# Patient Record
Sex: Male | Born: 1960 | Race: Black or African American | Hispanic: No | Marital: Married | Smoking: Former smoker
Health system: Southern US, Community
[De-identification: ages and names within clinical notes are randomized; demographics above are authoritative.]

## PROBLEM LIST (undated history)

## (undated) DIAGNOSIS — G4730 Sleep apnea, unspecified: Secondary | ICD-10-CM

## (undated) DIAGNOSIS — R609 Edema, unspecified: Secondary | ICD-10-CM

## (undated) DIAGNOSIS — J4489 Other specified chronic obstructive pulmonary disease: Secondary | ICD-10-CM

## (undated) DIAGNOSIS — F418 Other specified anxiety disorders: Secondary | ICD-10-CM

## (undated) DIAGNOSIS — D649 Anemia, unspecified: Secondary | ICD-10-CM

## (undated) DIAGNOSIS — M1990 Unspecified osteoarthritis, unspecified site: Secondary | ICD-10-CM

## (undated) DIAGNOSIS — J449 Chronic obstructive pulmonary disease, unspecified: Secondary | ICD-10-CM

## (undated) DIAGNOSIS — K219 Gastro-esophageal reflux disease without esophagitis: Secondary | ICD-10-CM

## (undated) DIAGNOSIS — N281 Cyst of kidney, acquired: Secondary | ICD-10-CM

## (undated) DIAGNOSIS — I10 Essential (primary) hypertension: Secondary | ICD-10-CM

## (undated) DIAGNOSIS — J4520 Mild intermittent asthma, uncomplicated: Secondary | ICD-10-CM

## (undated) DIAGNOSIS — E7800 Pure hypercholesterolemia, unspecified: Secondary | ICD-10-CM

## (undated) DIAGNOSIS — F419 Anxiety disorder, unspecified: Secondary | ICD-10-CM

## (undated) DIAGNOSIS — E119 Type 2 diabetes mellitus without complications: Secondary | ICD-10-CM

## (undated) DIAGNOSIS — T7840XA Allergy, unspecified, initial encounter: Secondary | ICD-10-CM

## (undated) HISTORY — DX: Essential (primary) hypertension: I10

## (undated) HISTORY — DX: Edema, unspecified: R60.9

## (undated) HISTORY — DX: Mild intermittent asthma, uncomplicated: J45.20

## (undated) HISTORY — PX: KNEE ARTHROSCOPY: SUR90

## (undated) HISTORY — DX: Type 2 diabetes mellitus without complications: E11.9

## (undated) HISTORY — DX: Chronic obstructive pulmonary disease, unspecified: J44.9

## (undated) HISTORY — PX: VEIN SURGERY: SHX48

## (undated) HISTORY — DX: Other specified chronic obstructive pulmonary disease: J44.89

## (undated) HISTORY — DX: Allergy, unspecified, initial encounter: T78.40XA

## (undated) HISTORY — DX: Cyst of kidney, acquired: N28.1

## (undated) HISTORY — DX: Anxiety disorder, unspecified: F41.9

## (undated) HISTORY — DX: Other specified anxiety disorders: F41.8

## (undated) HISTORY — DX: Pure hypercholesterolemia, unspecified: E78.00

## (undated) HISTORY — PX: BLADDER REPAIR: SHX76

## (undated) HISTORY — PX: HAND SURGERY: SHX662

---

## 2005-04-19 LAB — HM COLONOSCOPY: HM Colonoscopy: NORMAL

## 2005-06-14 ENCOUNTER — Ambulatory Visit: Payer: Self-pay | Admitting: General Surgery

## 2005-12-07 ENCOUNTER — Ambulatory Visit: Payer: Self-pay | Admitting: Family Medicine

## 2005-12-07 IMAGING — CR DG RIBS 2V*R*
1 series · 4 of 4 positions shown · non-contrast
Comparison: none

REASON FOR EXAM: XRAY RT LOWER RIB
COMMENTS:

PROCEDURE:     DXR - DXR RIBS RIGHT UNILATERAL  - [DATE] [DATE]
RESULT:     Three views of the RIGHT ribs show no fracture or other acute
bony abnormality.

[Series 1: view not recorded · 0.17mm/px · 4 of 4 slices shown]
[im 1/4]
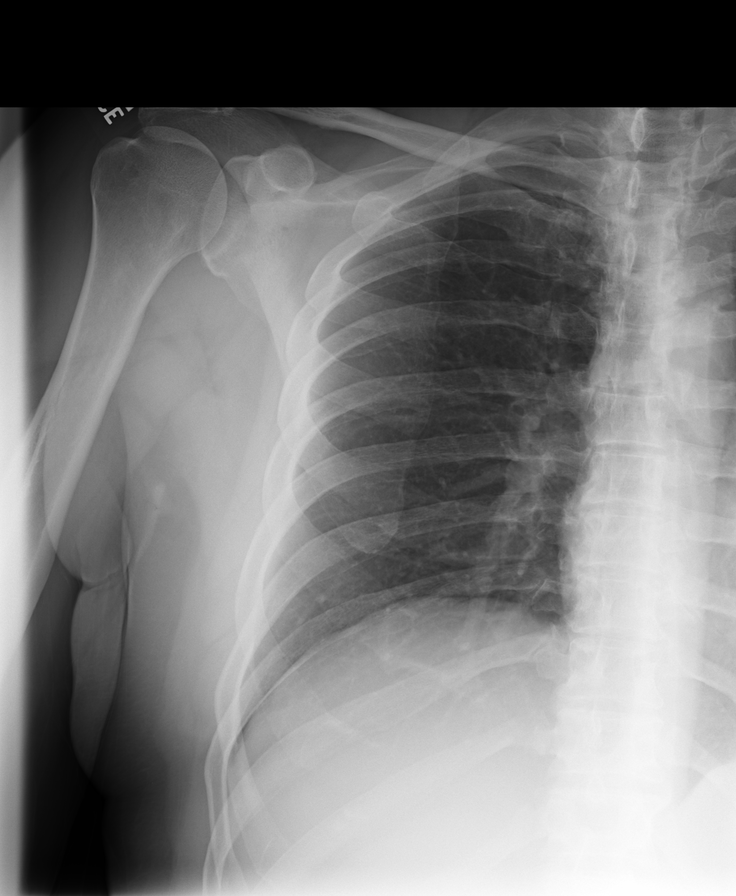
[im 2/4]
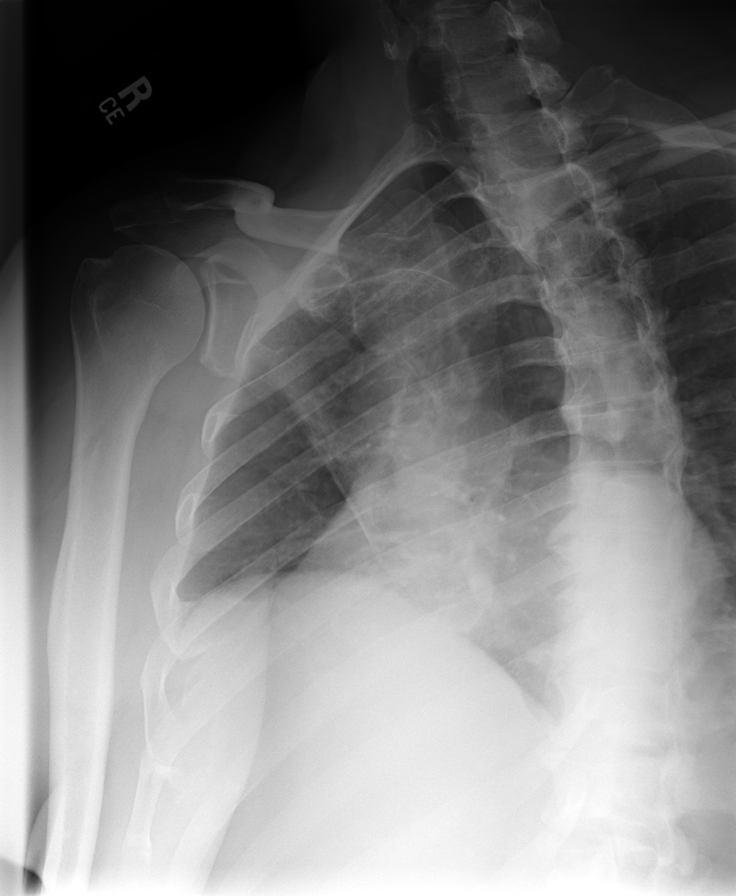
[im 3/4]
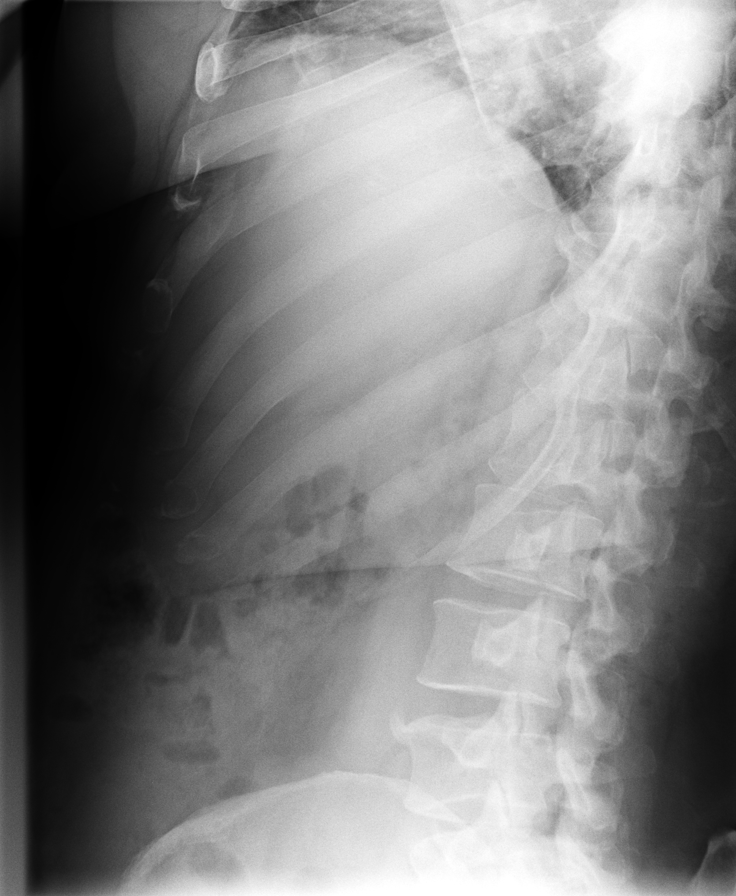
[im 4/4]
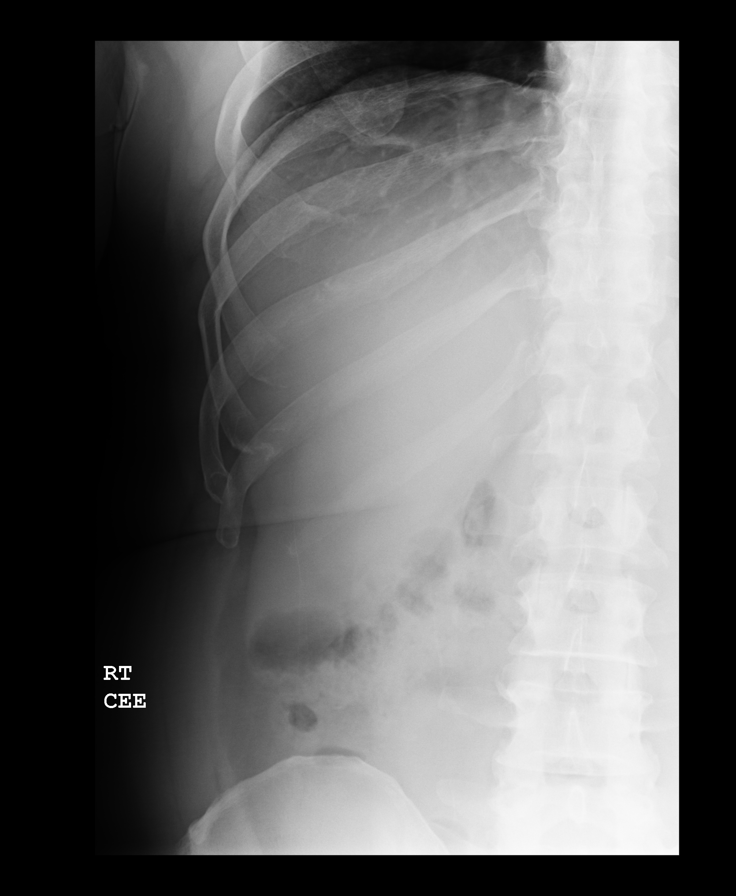

[4 of 4 positions shown; findings below may reference images not displayed]

IMPRESSION: No acute changes are identified.

## 2006-06-09 ENCOUNTER — Ambulatory Visit: Payer: Self-pay | Admitting: Specialist

## 2006-06-09 IMAGING — CT CT ABDOMEN AND PELVIS WITHOUT AND WITH CONTRAST
2 of 4 series · 14 of 32 positions shown, 19 images · non-contrast
Comparison: none

REASON FOR EXAM: kidney stones hematuria
COMMENTS:

[Series 3: with · axial · 0.85mm/px · z∈[-531,-131]mm · 8 of 66 slices shown, 13 images]
[im 8/66  soft-tissue]
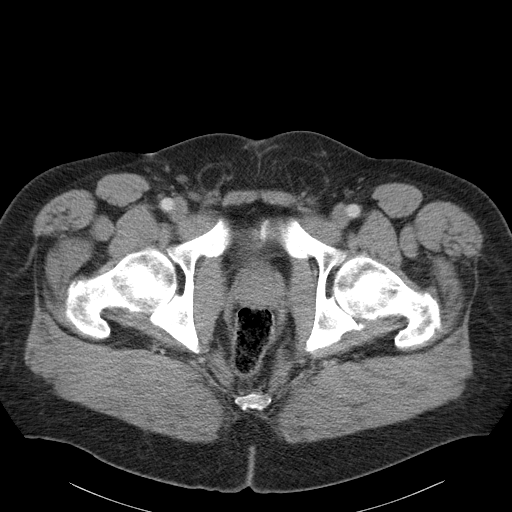
[im 8/66  bone]
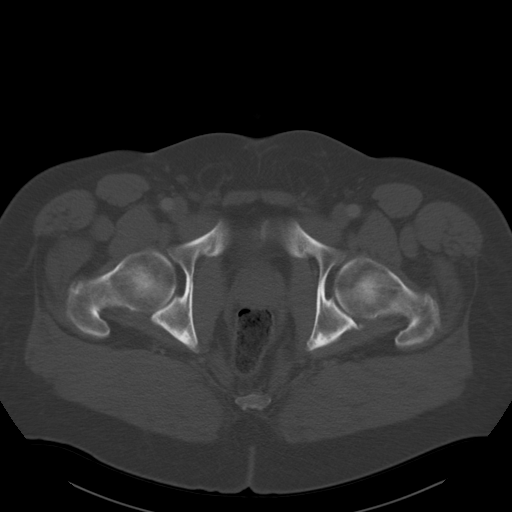
[im 15/66  soft-tissue]
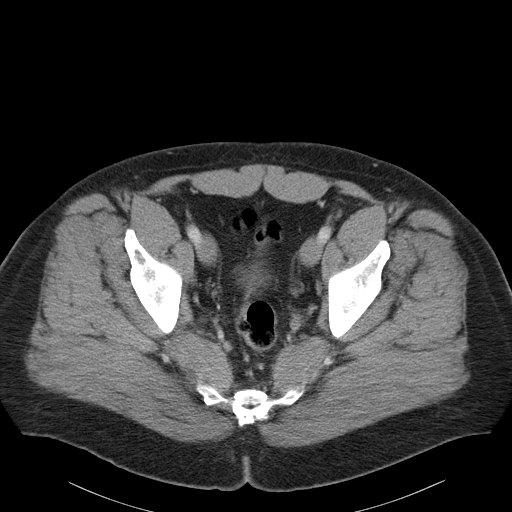
[im 22/66  soft-tissue]
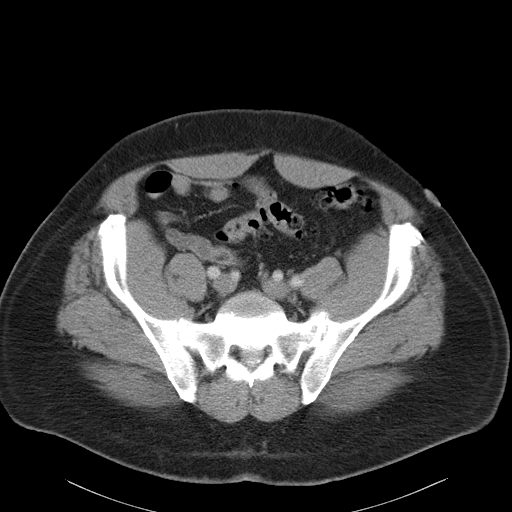
[im 29/66  soft-tissue]
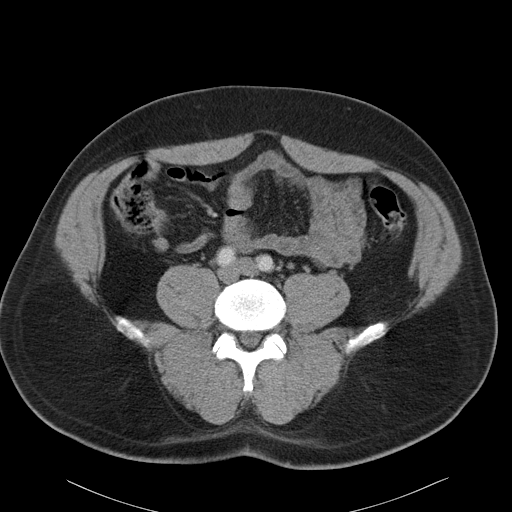
[im 37/66  soft-tissue]
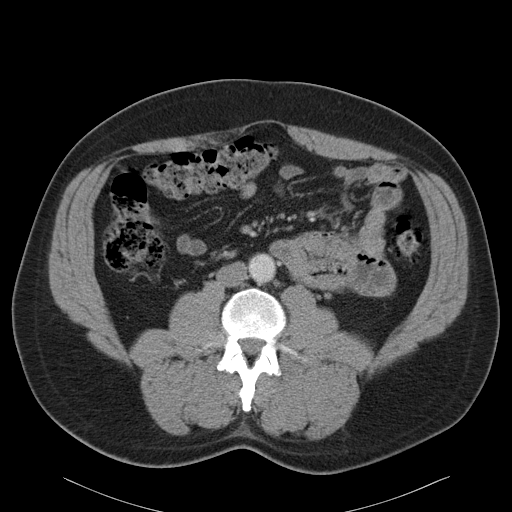
[im 37/66  lung]
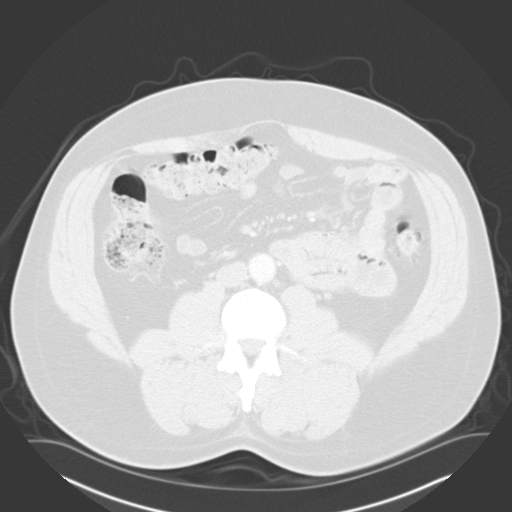
[im 44/66  soft-tissue]
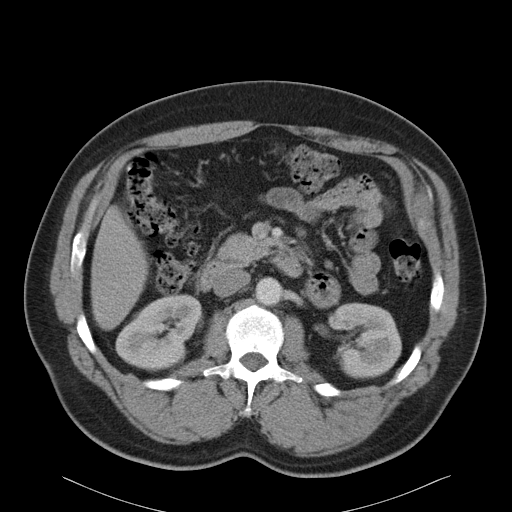
[im 44/66  lung]
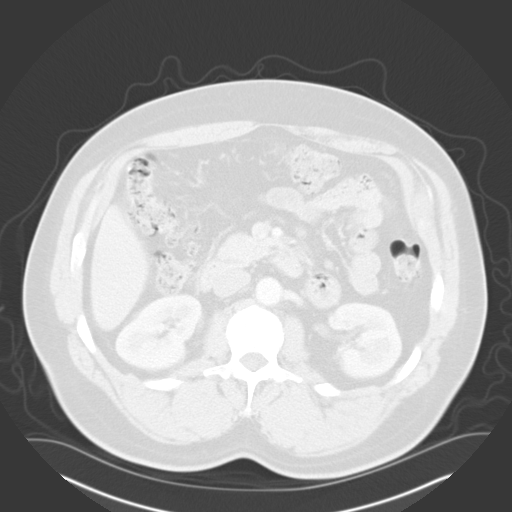
[im 51/66  soft-tissue]
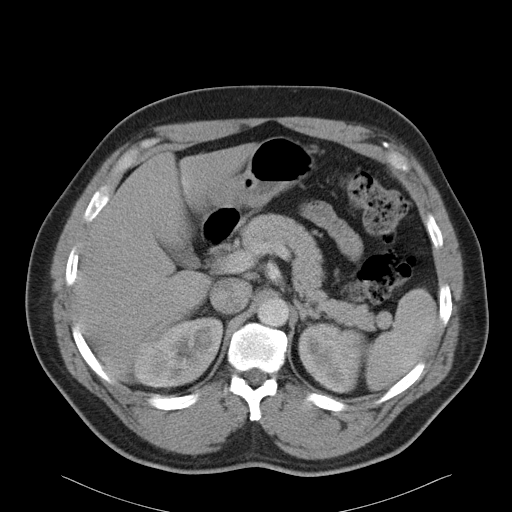
[im 51/66  lung]
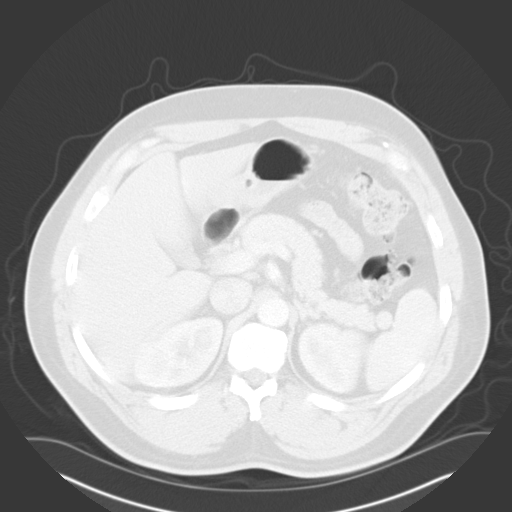
[im 58/66  soft-tissue]
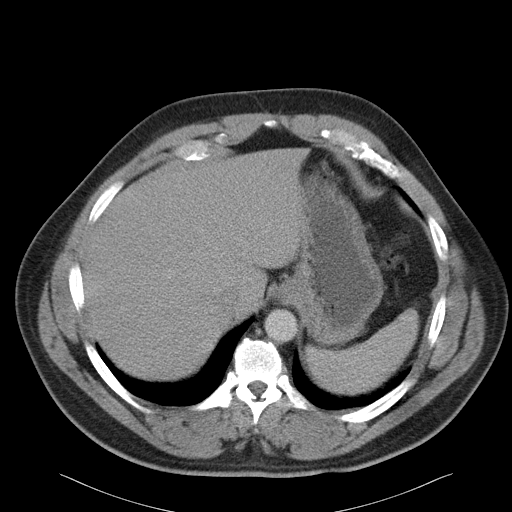
[im 58/66  lung]
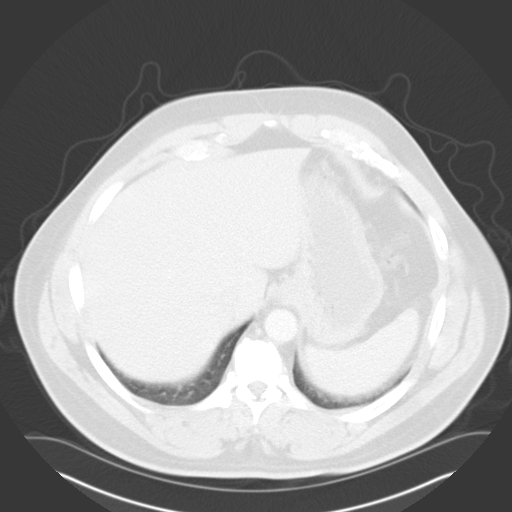

[Series 6: delay · axial · delayed · 0.85mm/px · z∈[-531,-243]mm · 6 of 66 slices shown]
[im 8/66  soft-tissue]
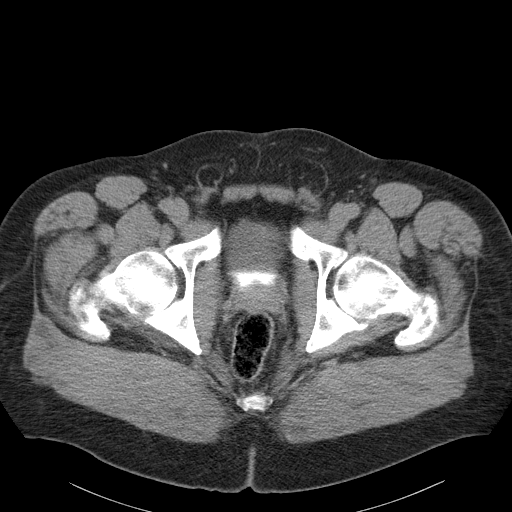
[im 15/66  soft-tissue]
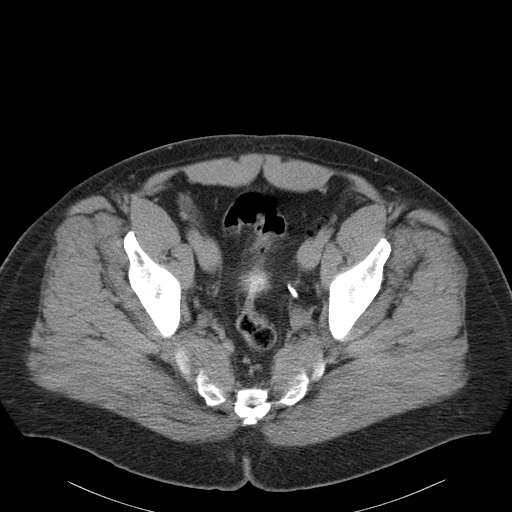
[im 22/66  soft-tissue]
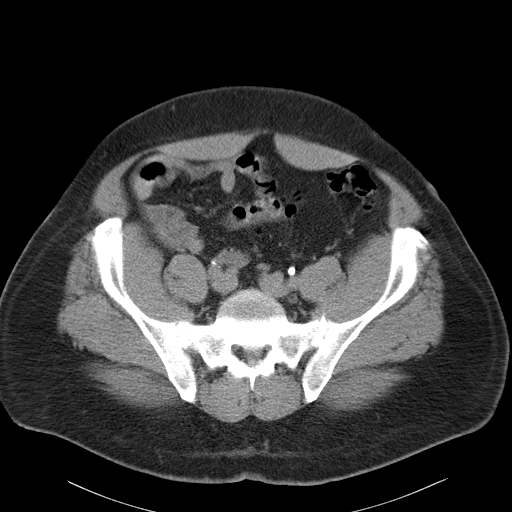
[im 29/66  soft-tissue]
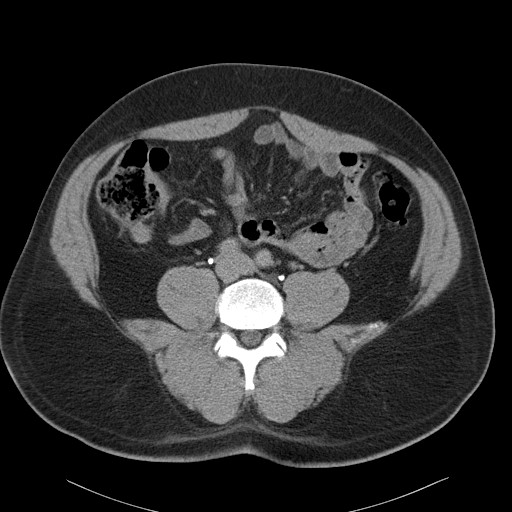
[im 37/66  soft-tissue]
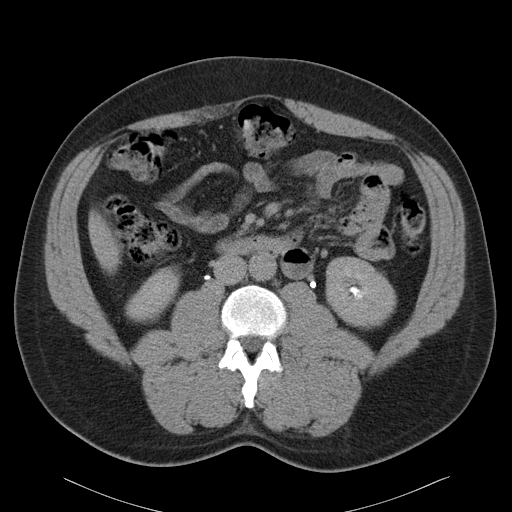
[im 44/66  soft-tissue]
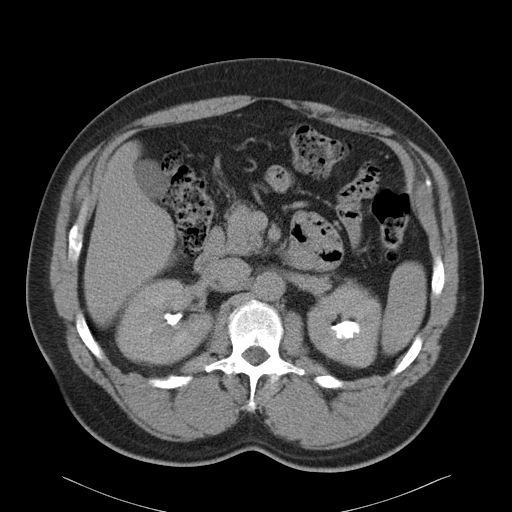

[14 of 32 positions shown; findings below may reference images not displayed]

PROCEDURE:     CT  - CT ABDOMEN / PELVIS  W/WO  - [DATE]  [DATE]

RESULT:     No urinary tract calculi are evident on the noncontrast images.
No radiopaque gallstones are present. The liver, spleen, kidneys and
pancreas as well as the aorta appear to be normal. There is a small low
density in the right kidney measuring approximately 10 to 11 mm in size and
having a Hounsfield reading of 18 suggestive of a cyst. This is not
significantly different than that seen on prior study. No definite solid
renal lesions are evident.
IMPRESSION: 1. Stable appearing low density area in the right kidney suggestive of a
cyst. The immediate postcontrast images show Hounsfield reading of 9. On the
delayed images there appears to be some volume averaging.
2. No definite solid masses or urinary tract stones.
3. No gallstones.
4. The lung bases are clear.

## 2006-09-20 DIAGNOSIS — R319 Hematuria, unspecified: Secondary | ICD-10-CM | POA: Insufficient documentation

## 2006-11-09 ENCOUNTER — Ambulatory Visit: Payer: Self-pay | Admitting: Family Medicine

## 2006-11-09 IMAGING — CR DG CHEST 2V
1 series · 2 of 2 positions shown · non-contrast
Comparison: none

REASON FOR EXAM: Dyspnea
COMMENTS:

PROCEDURE:     KDR - KDXR CHEST PA (OR AP) AND LAT  - [DATE]  [DATE]
RESULT:     The lung fields are clear. The heart, mediastinal and osseous
structures show no significant abnormalities.

[Series 1: view not recorded · 0.17mm/px · 2 of 2 slices shown]
[im 1/2]
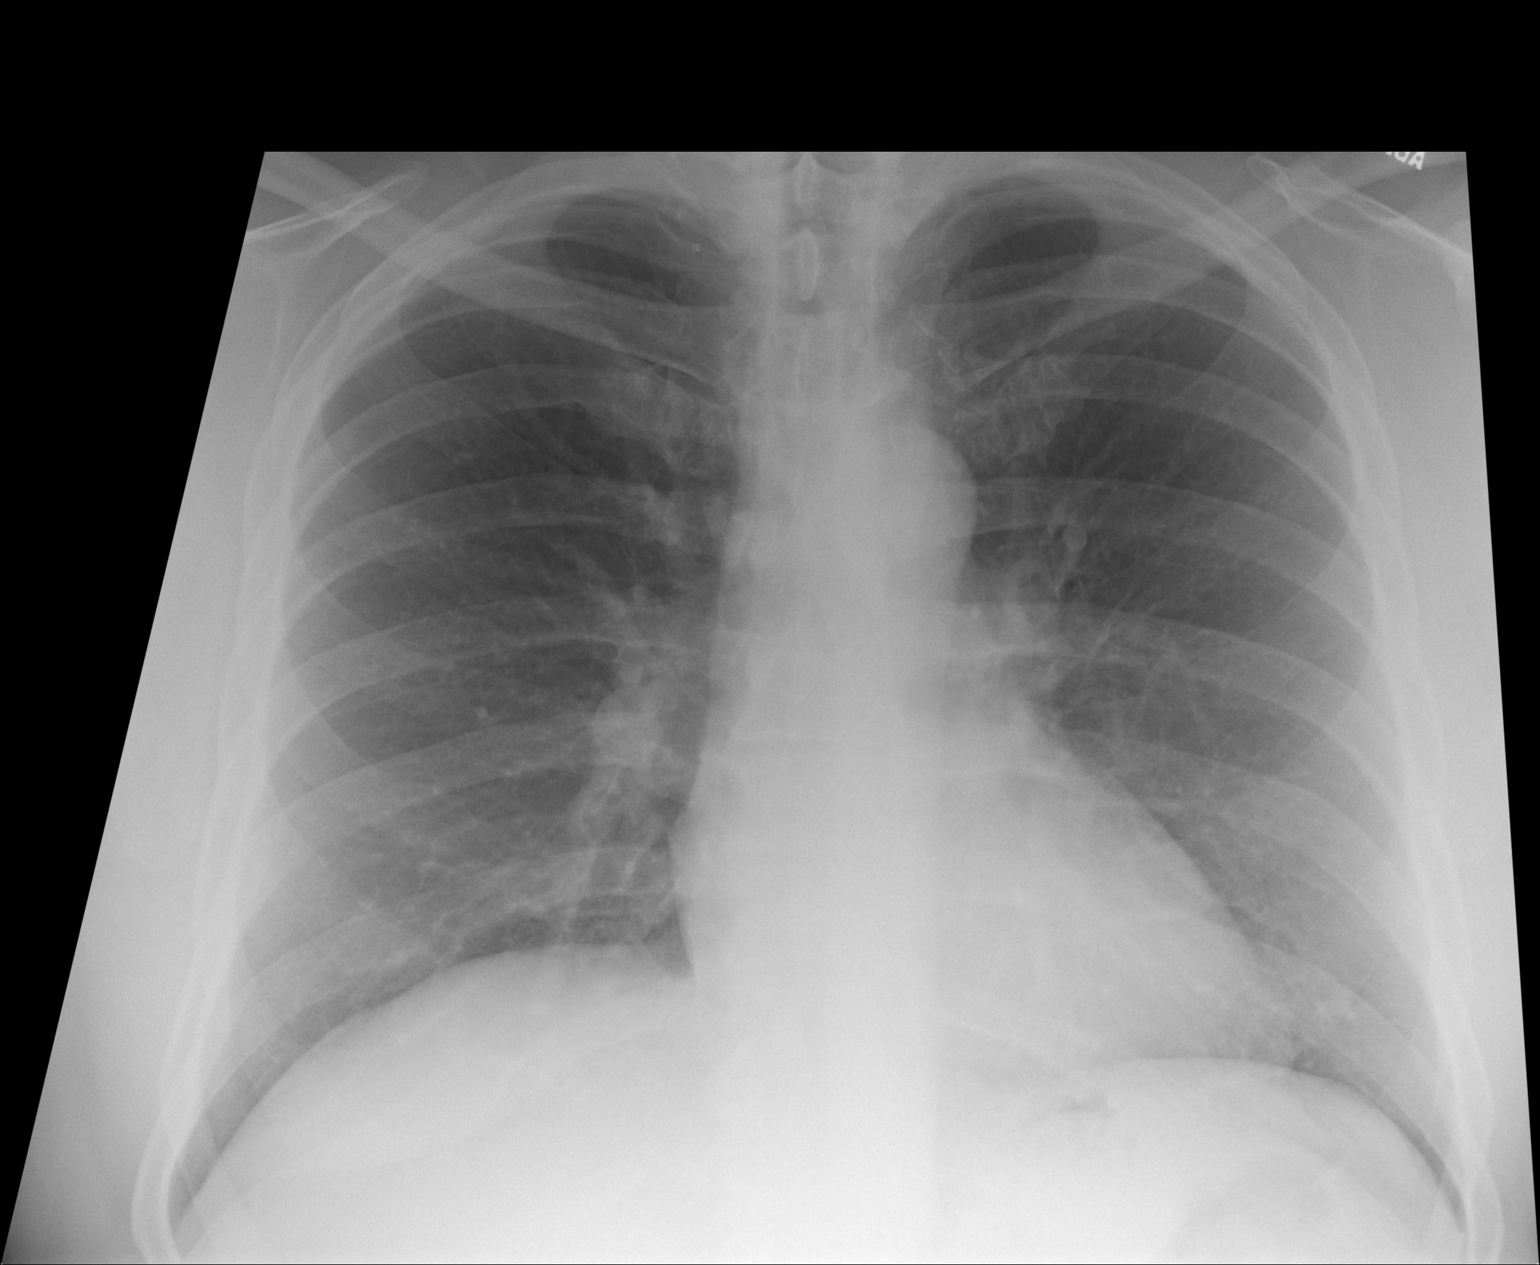
[im 2/2]
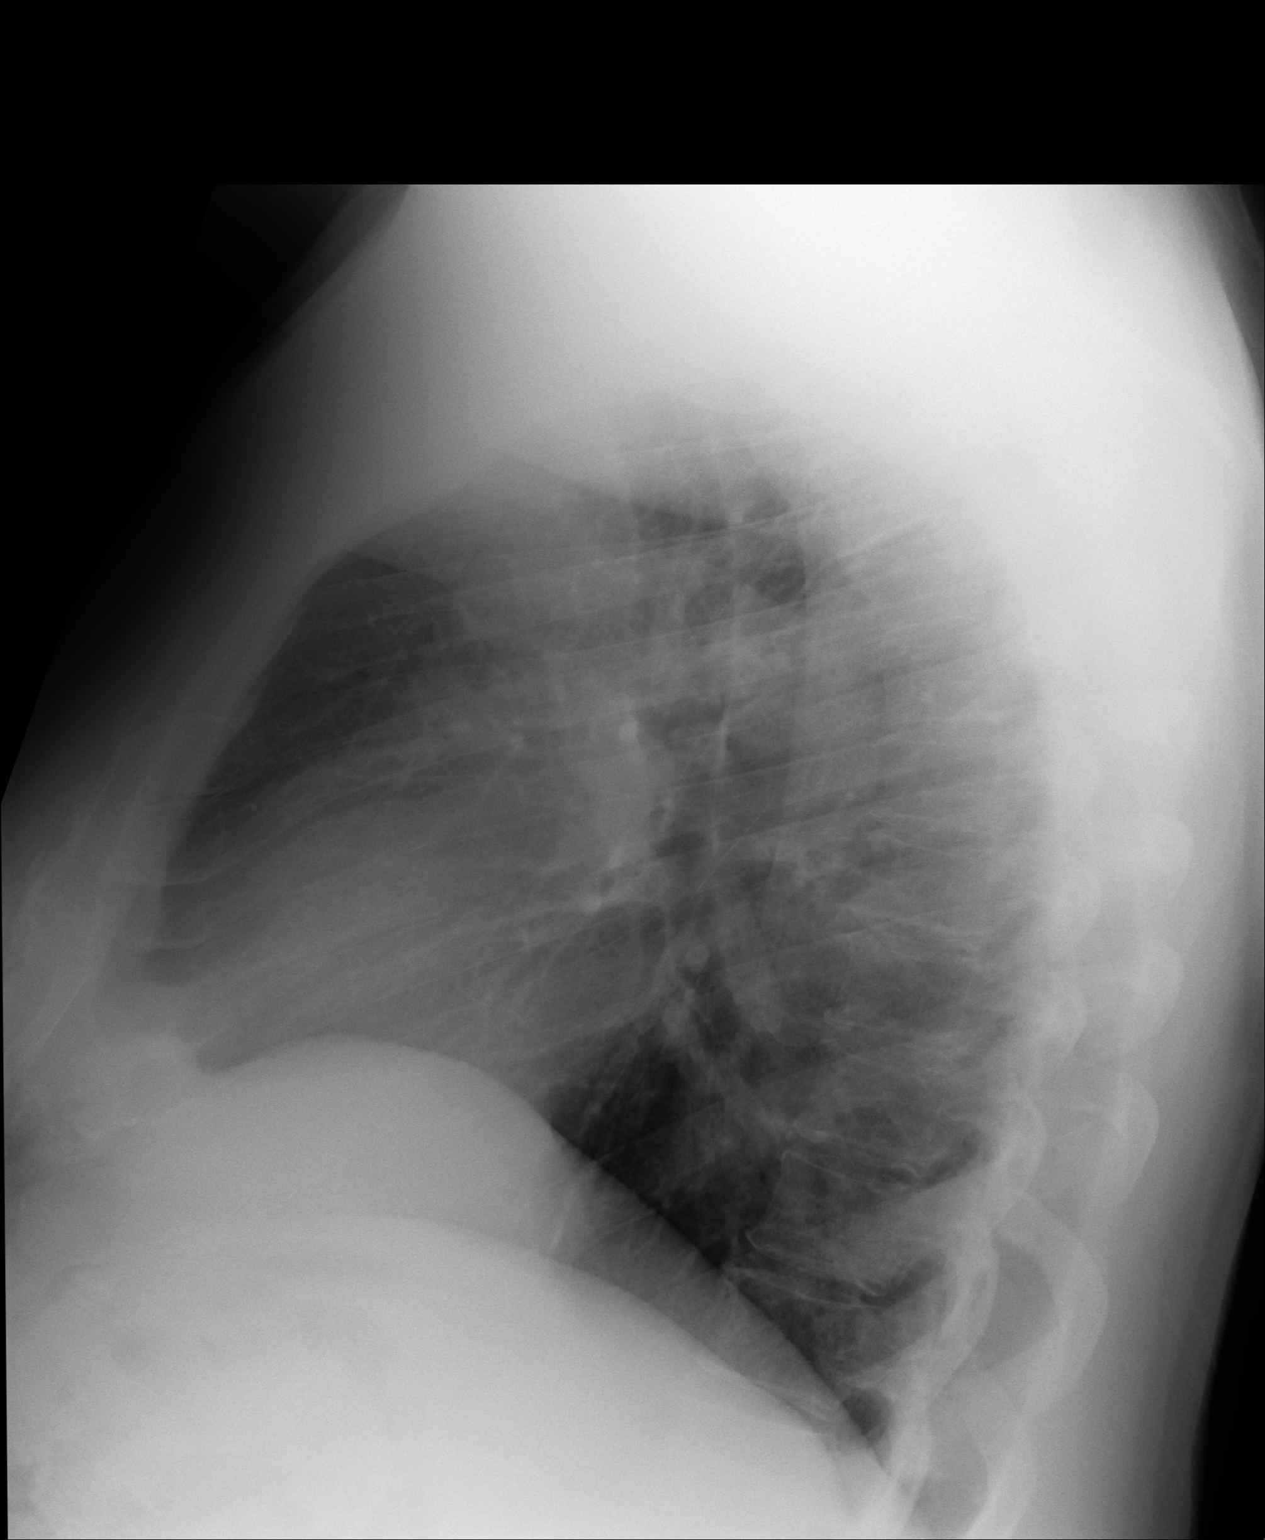

[2 of 2 positions shown; findings below may reference images not displayed]

IMPRESSION: No significant abnormalities are noted.

## 2007-04-26 ENCOUNTER — Emergency Department (HOSPITAL_COMMUNITY): Admission: EM | Admit: 2007-04-26 | Discharge: 2007-04-26 | Payer: Self-pay | Admitting: Emergency Medicine

## 2008-05-29 ENCOUNTER — Ambulatory Visit: Payer: Self-pay | Admitting: Specialist

## 2008-05-29 IMAGING — CT CT ABDOMEN AND PELVIS WITHOUT AND WITH CONTRAST
2 of 4 series · 13 of 32 positions shown, 18 images · non-contrast
Comparison: none

REASON FOR EXAM: hematuria  renal lesion  pt is diabetic on Metformin
COMMENTS:

[Series 3: with · axial · 0.84mm/px · z∈[-632,-232]mm · 8 of 104 slices shown, 13 images]
[im 12/104  soft-tissue]
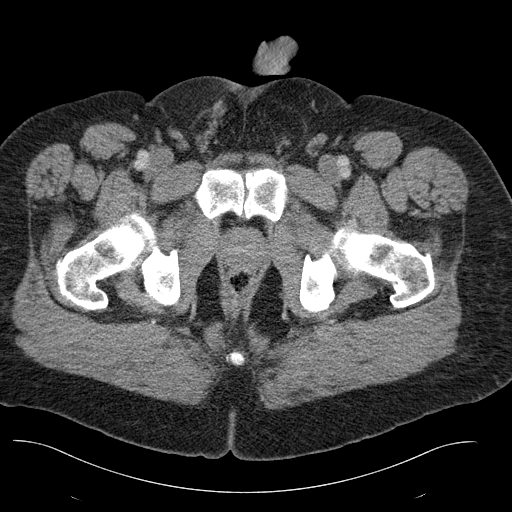
[im 12/104  bone]
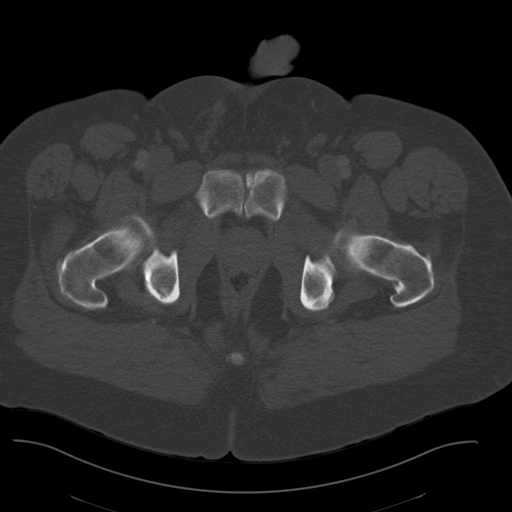
[im 23/104  soft-tissue]
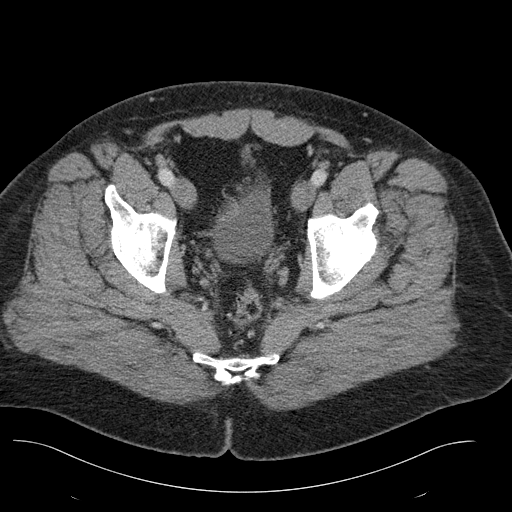
[im 35/104  soft-tissue]
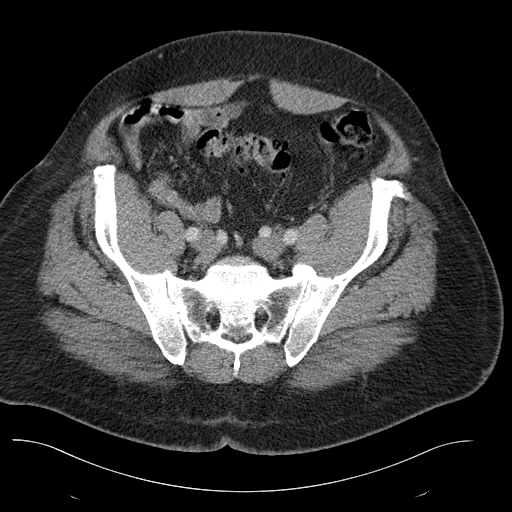
[im 46/104  soft-tissue]
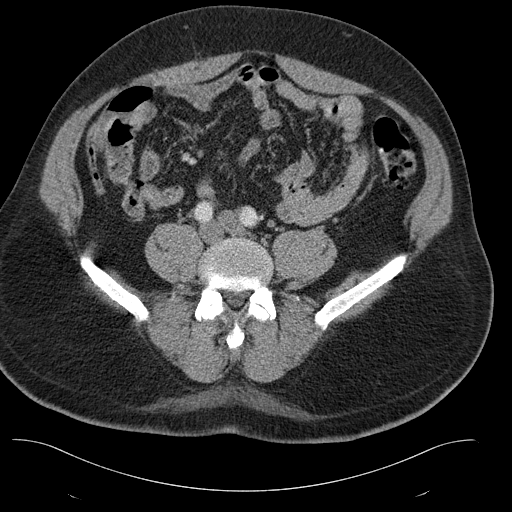
[im 58/104  soft-tissue]
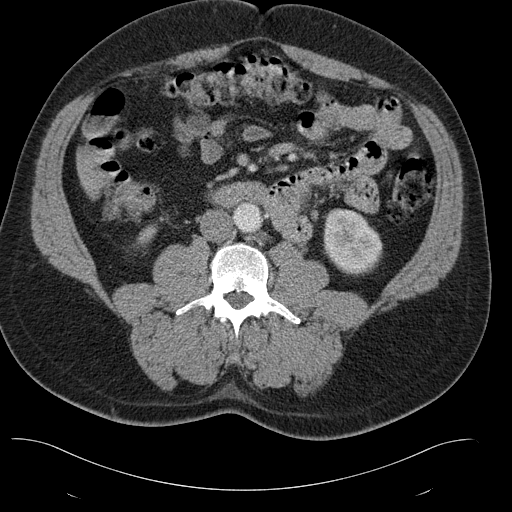
[im 58/104  lung]
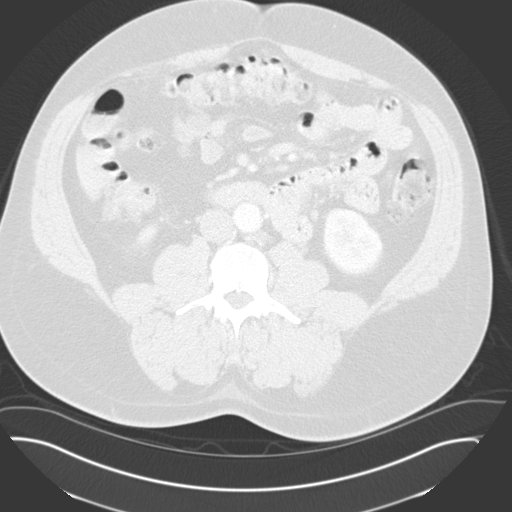
[im 69/104  soft-tissue]
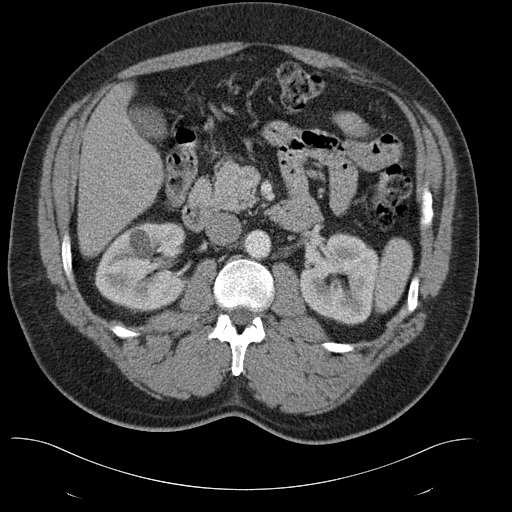
[im 69/104  lung]
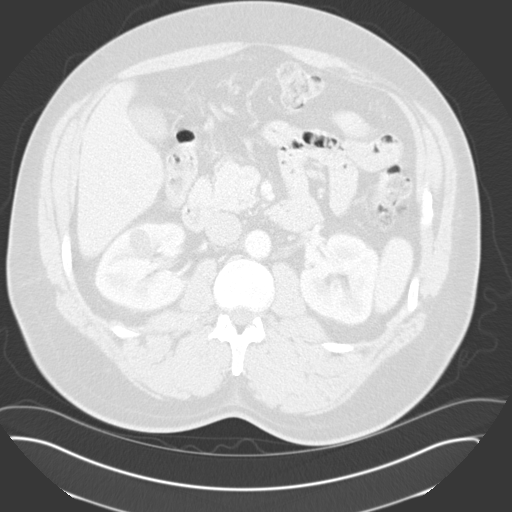
[im 81/104  soft-tissue]
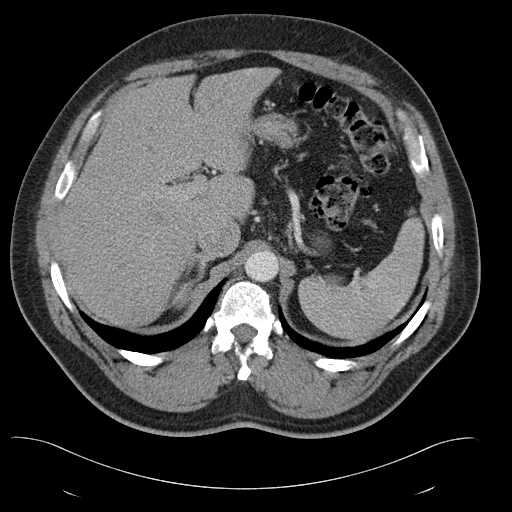
[im 81/104  lung]
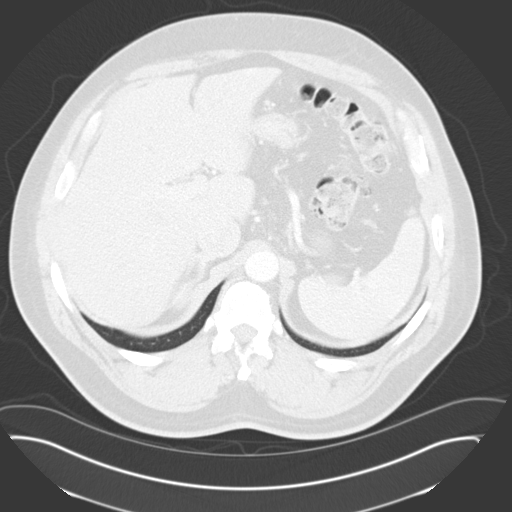
[im 92/104  soft-tissue]
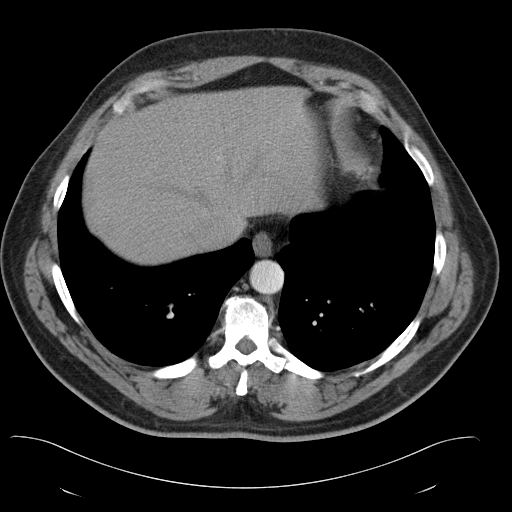
[im 92/104  lung]
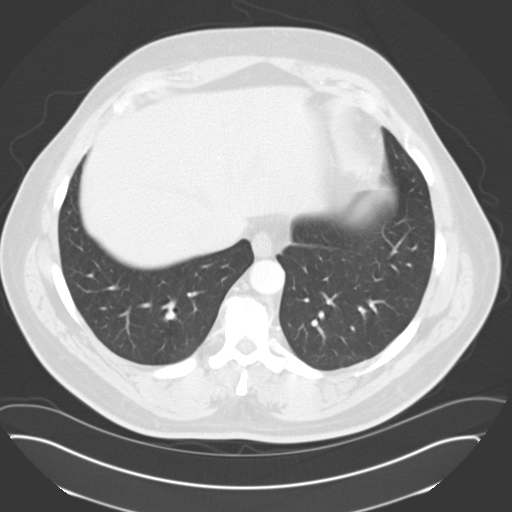

[Series 4: delay · axial · delayed · 0.84mm/px · z∈[-632,-402]mm · 5 of 104 slices shown]
[im 12/104  soft-tissue]
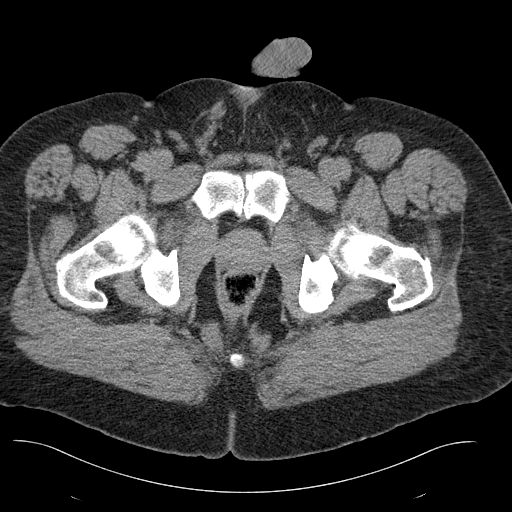
[im 23/104  soft-tissue]
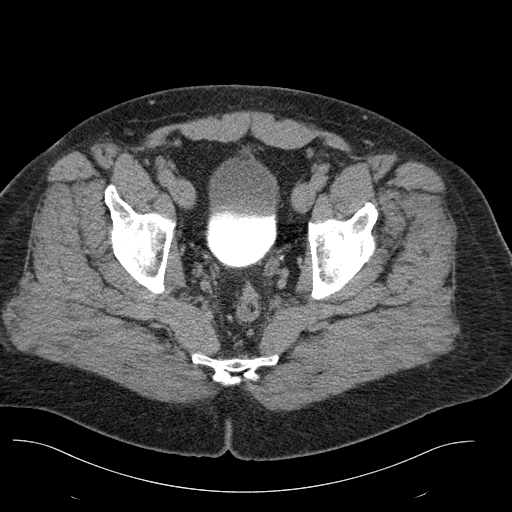
[im 35/104  soft-tissue]
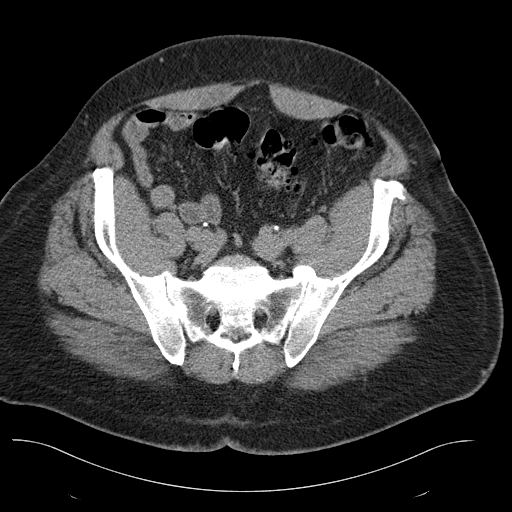
[im 46/104  soft-tissue]
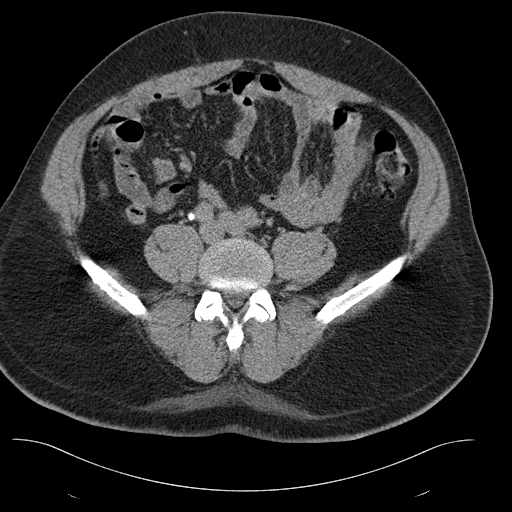
[im 58/104  soft-tissue]
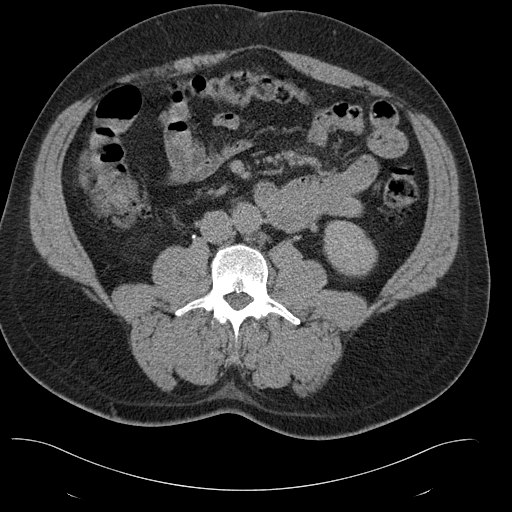

[13 of 32 positions shown; findings below may reference images not displayed]

PROCEDURE:     CT  - CT ABDOMEN / PELVIS  W/WO  - [DATE]  [DATE]

RESULT:     The patient has undergone triphasic CT scanning through the
abdomen and pelvis for evaluation of hematuria and a right renal lesion.
Comparison is made to the study [DATE]. At that time there were
findings in the right kidney which were possibly reflective of a cyst. Axial
imaging was performed at 5 mm intervals and slice thicknesses prior to and
following administration of 100 cc of Isovue 370. Delayed images were
obtained as well. Review of 3-dimensional reconstructed images was performed
separately on the [employer] monitor.

On the noncontrast images in the anterior aspect of the midpole of the right
kidney there is an approximately 2 x 2 0.5 cm hypodensity. It exhibits a
Hounsfield measurement of 7 precontrast, 5 postcontrast, and 11 on delayed
images. This appears stable. In addition in the posterior aspect of the
lower pole of the right kidney there is a 1 cm diameter partially exophytic
hypodensity with Hounsfield measurements of 12 precontrast, 35 postcontrast,
and 10 on delayed images. This lesion was not clearly evident on the prior
study. No left renal lesions are identified. Neither kidney exhibits
evidence of calcified stones, obstruction, or perinephric inflammatory
change. On delayed images there is fractional visualization of the ureters.
The partially distended urinary bladder exhibits a mild irregular impression
upon its base by the prostate gland.  This appears stable.

The liver, gallbladder, spleen, nondistended stomach, pancreas, adrenal
glands, and the periaortic and pericaval regions are normal in appearance.
The caliber of the abdominal aorta is normal. The unopacified loops of small
and large bowel exhibit no acute abnormality. At lung window settings the
lung bases are clear. The lumbar vertebral bodies are preserved in height.
IMPRESSION: 1. Within the right kidney there is a stable midpole hypodensity in most
compatible with a cyst. In addition, in the lower pole posteriorly there is
a partially exophytic hypodensity which does not have clearly benign
Hounsfield measurements. This may be at or below the threshold for detection
by ultrasound. An attempt at ultrasound with specific attention to the lower
pole of the right kidney posteriorly could be made.
2. I do not see evidence of urinary tract obstruction nor calcified stones
nor inflammatory changes. The left kidney exhibits no acute abnormality.
3. There is a mild impression made upon the urinary bladder base by the
prostate gland which appears stable.

## 2010-05-09 ENCOUNTER — Ambulatory Visit: Payer: Self-pay | Admitting: Internal Medicine

## 2010-09-19 ENCOUNTER — Other Ambulatory Visit: Payer: Self-pay | Admitting: Family Medicine

## 2010-09-21 LAB — PSA

## 2012-01-10 LAB — PSA: PSA: 0.7

## 2012-12-06 DIAGNOSIS — H47099 Other disorders of optic nerve, not elsewhere classified, unspecified eye: Secondary | ICD-10-CM | POA: Insufficient documentation

## 2012-12-06 DIAGNOSIS — IMO0001 Reserved for inherently not codable concepts without codable children: Secondary | ICD-10-CM | POA: Insufficient documentation

## 2013-03-02 ENCOUNTER — Ambulatory Visit (INDEPENDENT_AMBULATORY_CARE_PROVIDER_SITE_OTHER): Payer: BC Managed Care – PPO

## 2013-03-02 ENCOUNTER — Ambulatory Visit (INDEPENDENT_AMBULATORY_CARE_PROVIDER_SITE_OTHER): Payer: BC Managed Care – PPO | Admitting: Podiatry

## 2013-03-02 ENCOUNTER — Encounter: Payer: Self-pay | Admitting: Podiatry

## 2013-03-02 VITALS — BP 104/65 | HR 96 | Resp 16 | Ht 77.0 in | Wt 335.0 lb

## 2013-03-02 DIAGNOSIS — D492 Neoplasm of unspecified behavior of bone, soft tissue, and skin: Secondary | ICD-10-CM

## 2013-03-02 DIAGNOSIS — M7750 Other enthesopathy of unspecified foot: Secondary | ICD-10-CM

## 2013-03-02 DIAGNOSIS — M79671 Pain in right foot: Secondary | ICD-10-CM

## 2013-03-02 DIAGNOSIS — M79609 Pain in unspecified limb: Secondary | ICD-10-CM

## 2013-03-02 MED ORDER — TRIAMCINOLONE ACETONIDE 10 MG/ML IJ SUSP
5.0000 mg | Freq: Once | INTRAMUSCULAR | Status: AC
  Administered 2013-03-02: 5 mg via INTRA_ARTICULAR

## 2013-03-02 NOTE — Progress Notes (Signed)
Subjective:     Patient ID: Isaac Cross, male   DOB: 04-15-1961, 52 y.o.   MRN: [id number]  Foot Pain   patient presents stating I have pain in the outside of my right foot that has been present for about a month after been very active on my foot. does not remember any specific injury   Review of Systems  All other systems reviewed and are negative.       Objective:   Physical Exam  Nursing note and vitals reviewed. Constitutional: He is oriented to person, place, and time.  Cardiovascular: Intact distal pulses.   Musculoskeletal: Normal range of motion.  Neurological: He is oriented to person, place, and time.  Skin: Skin is warm.   lateral side of right foot is quite tender around the peroneal complex with inflammation and some nodular formation which she states has been there for a while. Neurovascular status intact with normal range of motion and muscle strength     Assessment:     Tendinitis right foot secondary to excessive activity with small nodular formation which appears to be either ganglionic cyst or other soft tissue path biology    Plan:     H&P and x-ray reviewed with patient. Careful injection administered around this area 3 mg Kenalog 5 mg Xylocaine Marcaine mixture and advised patient on compression therapy and heat application during the day reappoint to recheck again in 3 weeks

## 2013-03-02 NOTE — Progress Notes (Signed)
  Subjective:    Patient ID: Isaac Cross, male    DOB: 1961/01/24, 52 y.o.   MRN: [id number]  HPI Comments: N PAIN L RIGHT FOOT - DORSAL, LATERAL D 3 W O SUDDEN C BETTER A WALKING T 0  Foot Pain      Review of Systems  Constitutional: Negative.   HENT:       SINUS PROBLEMS  Eyes: Negative.   Respiratory: Negative.   Cardiovascular: Negative.   Gastrointestinal: Negative.   Endocrine: Negative.   Genitourinary: Negative.   Musculoskeletal: Negative.   Skin: Negative.   Allergic/Immunologic: Negative.   Neurological: Negative.   Hematological: Negative.   Psychiatric/Behavioral: Negative.        Objective:   Physical Exam        Assessment & Plan:

## 2013-03-23 ENCOUNTER — Encounter: Payer: Self-pay | Admitting: Podiatry

## 2013-03-23 ENCOUNTER — Ambulatory Visit (INDEPENDENT_AMBULATORY_CARE_PROVIDER_SITE_OTHER): Payer: BC Managed Care – PPO | Admitting: Podiatry

## 2013-03-23 VITALS — BP 98/60 | HR 93 | Resp 16

## 2013-03-23 DIAGNOSIS — Z872 Personal history of diseases of the skin and subcutaneous tissue: Secondary | ICD-10-CM | POA: Insufficient documentation

## 2013-03-23 DIAGNOSIS — M7750 Other enthesopathy of unspecified foot: Secondary | ICD-10-CM

## 2013-03-23 DIAGNOSIS — M6740 Ganglion, unspecified site: Secondary | ICD-10-CM

## 2013-03-23 MED ORDER — TRIAMCINOLONE ACETONIDE 10 MG/ML IJ SUSP
10.0000 mg | Freq: Once | INTRAMUSCULAR | Status: AC
  Administered 2013-03-23: 10 mg

## 2013-03-24 NOTE — Progress Notes (Signed)
Subjective:     Patient ID: Isaac Cross, male   DOB: 1961/03/13, 51 y.o.   MRN: [id number]  HPI patient presents stating that my tendon feels better but this cyst bothers me when shoe gear presses against it lateral side right foot   Review of Systems     Objective:   Physical Exam Neurovascular status intact with no health changes noted. 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 Using 10 cc syringe I aspirated the area was able to get out approximately 1 cc of gelatinous fluid and then applied a small amount of steroid under it and compressed. Explained that this may regrow and the nature of ganglionic cyst

## 2013-05-11 ENCOUNTER — Ambulatory Visit (INDEPENDENT_AMBULATORY_CARE_PROVIDER_SITE_OTHER): Payer: BC Managed Care – PPO | Admitting: Podiatry

## 2013-05-11 ENCOUNTER — Encounter: Payer: Self-pay | Admitting: Podiatry

## 2013-05-11 VITALS — BP 92/59 | HR 98 | Resp 16 | Ht 77.0 in | Wt 343.0 lb

## 2013-05-11 DIAGNOSIS — M6740 Ganglion, unspecified site: Secondary | ICD-10-CM

## 2013-05-11 NOTE — Progress Notes (Signed)
Subjective:     Patient ID: Isaac Cross, male   DOB: 11-20-1960, 53 y.o.   MRN: [id number]  HPI patient presents stating this cyst is back of my right foot and it is moderately tender   Review of Systems     Objective:   Physical Exam Neurovascular status unchanged with no other changes in health history and I noted there to be a mass on the lateral side right foot measuring about 1.5 cm x 1.5 cm with mild discomfort when pressed    Assessment:     Returned of probable ganglionic cyst lateral side right foot    Plan:     Proximal block I then aspirated the area was able to get out about 2 cc of gelatinous material and applied a small amount of dexamethasone and compression to the area. He will reappoint if it reoccurs

## 2013-05-14 DIAGNOSIS — G4733 Obstructive sleep apnea (adult) (pediatric): Secondary | ICD-10-CM | POA: Insufficient documentation

## 2013-07-10 ENCOUNTER — Ambulatory Visit (INDEPENDENT_AMBULATORY_CARE_PROVIDER_SITE_OTHER): Payer: BC Managed Care – PPO

## 2013-07-10 ENCOUNTER — Ambulatory Visit (INDEPENDENT_AMBULATORY_CARE_PROVIDER_SITE_OTHER): Payer: BC Managed Care – PPO | Admitting: Podiatry

## 2013-07-10 VITALS — BP 80/50 | HR 101

## 2013-07-10 DIAGNOSIS — M79673 Pain in unspecified foot: Secondary | ICD-10-CM

## 2013-07-10 DIAGNOSIS — M6740 Ganglion, unspecified site: Secondary | ICD-10-CM

## 2013-07-10 DIAGNOSIS — M79609 Pain in unspecified limb: Secondary | ICD-10-CM

## 2013-07-11 NOTE — Progress Notes (Signed)
Subjective:     Patient ID: Isaac Cross, male   DOB: 05/05/1960, 53 y.o.   MRN: [id number]  HPI patient presents stating this cyst has gotten larger on my right foot and I need to have it removed at this is starting to become very painful. 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 He wants it removed and I explained that just because we remove it there is no guarantee it will not recur again. He wants the procedure and read consent form signing after reviewing all possible complications as listed in the consent form. I dispensed air fracture walker as I want to keep his foot at 90 after the surgery and he is scheduled for outpatient surgery

## 2013-07-31 ENCOUNTER — Encounter: Payer: Self-pay | Admitting: Podiatry

## 2013-07-31 DIAGNOSIS — M6740 Ganglion, unspecified site: Secondary | ICD-10-CM

## 2013-08-03 NOTE — Progress Notes (Signed)
   Subjective:    Patient ID: Isaac Cross, male    DOB: Feb 16, 1961, 53 y.o.   MRN: [id number]  HPI    Review of Systems     Objective:   Physical Exam        Assessment & Plan:

## 2013-08-03 NOTE — Progress Notes (Signed)
1. Removal soft tissue tumor suspect ganglion cyst (r) lat foot

## 2013-08-07 ENCOUNTER — Encounter: Payer: Self-pay | Admitting: Podiatry

## 2013-08-07 ENCOUNTER — Ambulatory Visit (INDEPENDENT_AMBULATORY_CARE_PROVIDER_SITE_OTHER): Payer: BC Managed Care – PPO | Admitting: Podiatry

## 2013-08-07 VITALS — BP 101/68 | HR 84 | Resp 12

## 2013-08-07 DIAGNOSIS — M6740 Ganglion, unspecified site: Secondary | ICD-10-CM

## 2013-08-07 NOTE — Progress Notes (Signed)
Subjective:     Patient ID: Isaac Cross, male   DOB: 1960-08-29, 53 y.o.   MRN: [id number]  HPI patient states I'm healing well and I'm very happy with the surgery of last week with minimal discomfort   Review of Systems     Objective:   Physical Exam Neurovascular status intact with incision site right healing very well with wound edges coapted and minimal edema    Assessment:     Doing well post ganglionic cyst removal right    Plan:     Instructed on continued compression and dispensed anklet and Ace wrap and continue immobilization for the next several weeks. Reappoint her recheck and he understands he may get recurrence of this

## 2013-08-08 NOTE — Progress Notes (Signed)
rx   vicodin 10/325 #35 0 refills one to two by mouth every 4 - 6 hrs as needed for pain.

## 2013-08-09 LAB — LIPID PANEL
Cholesterol: 126 mg/dL (ref 0–200)
HDL: 42 mg/dL (ref 35–70)
Triglycerides: 84 mg/dL (ref 40–160)

## 2013-08-12 LAB — LIPID PANEL: LDL CALC: 67 mg/dL

## 2013-09-11 ENCOUNTER — Encounter: Payer: Self-pay | Admitting: Podiatry

## 2014-06-17 LAB — HEPATIC FUNCTION PANEL
ALT: 26 U/L (ref 10–40)
AST: 30 U/L (ref 14–40)

## 2014-06-17 LAB — BASIC METABOLIC PANEL
CREATININE: 1.2 mg/dL (ref <=1.3)
GLUCOSE: 99 mg/dL

## 2014-06-17 LAB — HEMOGLOBIN A1C: Hgb A1c MFr Bld: 5.6 % (ref 4.0–6.0)

## 2014-07-22 DIAGNOSIS — J449 Chronic obstructive pulmonary disease, unspecified: Secondary | ICD-10-CM | POA: Insufficient documentation

## 2014-07-22 DIAGNOSIS — M170 Bilateral primary osteoarthritis of knee: Secondary | ICD-10-CM | POA: Insufficient documentation

## 2014-07-22 DIAGNOSIS — A6000 Herpesviral infection of urogenital system, unspecified: Secondary | ICD-10-CM | POA: Insufficient documentation

## 2014-07-22 DIAGNOSIS — K219 Gastro-esophageal reflux disease without esophagitis: Secondary | ICD-10-CM | POA: Insufficient documentation

## 2014-07-22 DIAGNOSIS — J4489 Other specified chronic obstructive pulmonary disease: Secondary | ICD-10-CM | POA: Insufficient documentation

## 2014-07-22 DIAGNOSIS — H47019 Ischemic optic neuropathy, unspecified eye: Secondary | ICD-10-CM | POA: Insufficient documentation

## 2014-07-22 DIAGNOSIS — E1129 Type 2 diabetes mellitus with other diabetic kidney complication: Secondary | ICD-10-CM | POA: Insufficient documentation

## 2014-07-22 DIAGNOSIS — IMO0002 Reserved for concepts with insufficient information to code with codable children: Secondary | ICD-10-CM | POA: Insufficient documentation

## 2014-07-22 DIAGNOSIS — R809 Proteinuria, unspecified: Secondary | ICD-10-CM | POA: Insufficient documentation

## 2014-07-22 DIAGNOSIS — N529 Male erectile dysfunction, unspecified: Secondary | ICD-10-CM | POA: Insufficient documentation

## 2014-07-22 DIAGNOSIS — I10 Essential (primary) hypertension: Secondary | ICD-10-CM | POA: Insufficient documentation

## 2014-07-22 DIAGNOSIS — E785 Hyperlipidemia, unspecified: Secondary | ICD-10-CM | POA: Insufficient documentation

## 2014-07-22 DIAGNOSIS — F418 Other specified anxiety disorders: Secondary | ICD-10-CM | POA: Insufficient documentation

## 2014-07-22 HISTORY — DX: Chronic obstructive pulmonary disease, unspecified: J44.9

## 2014-09-20 ENCOUNTER — Other Ambulatory Visit: Payer: Self-pay | Admitting: Family Medicine

## 2014-09-20 ENCOUNTER — Telehealth: Payer: Self-pay

## 2014-09-20 MED ORDER — DAPAGLIFLOZIN PRO-METFORMIN ER 5-1000 MG PO TB24: 2.0000 | ORAL_TABLET | Freq: Every day | ORAL | Status: DC

## 2014-09-20 NOTE — Telephone Encounter (Signed)
Patient needs refill on Xigduo XR5-1000mg  sent to [facility]

## 2014-10-13 ENCOUNTER — Encounter: Payer: Self-pay | Admitting: Family Medicine

## 2014-10-13 DIAGNOSIS — J309 Allergic rhinitis, unspecified: Secondary | ICD-10-CM | POA: Insufficient documentation

## 2014-10-16 ENCOUNTER — Ambulatory Visit (INDEPENDENT_AMBULATORY_CARE_PROVIDER_SITE_OTHER): Payer: BLUE CROSS/BLUE SHIELD | Admitting: Family Medicine

## 2014-10-16 ENCOUNTER — Encounter: Payer: Self-pay | Admitting: Family Medicine

## 2014-10-16 VITALS — BP 124/68 | HR 99 | Temp 98.4°F | Resp 18 | Ht 75.5 in | Wt 318.8 lb

## 2014-10-16 DIAGNOSIS — J4520 Mild intermittent asthma, uncomplicated: Secondary | ICD-10-CM | POA: Diagnosis not present

## 2014-10-16 DIAGNOSIS — E1142 Type 2 diabetes mellitus with diabetic polyneuropathy: Secondary | ICD-10-CM | POA: Diagnosis not present

## 2014-10-16 DIAGNOSIS — G4733 Obstructive sleep apnea (adult) (pediatric): Secondary | ICD-10-CM

## 2014-10-16 DIAGNOSIS — E785 Hyperlipidemia, unspecified: Secondary | ICD-10-CM

## 2014-10-16 DIAGNOSIS — I10 Essential (primary) hypertension: Secondary | ICD-10-CM

## 2014-10-16 DIAGNOSIS — N529 Male erectile dysfunction, unspecified: Secondary | ICD-10-CM

## 2014-10-16 DIAGNOSIS — F418 Other specified anxiety disorders: Secondary | ICD-10-CM

## 2014-10-16 DIAGNOSIS — N528 Other male erectile dysfunction: Secondary | ICD-10-CM | POA: Diagnosis not present

## 2014-10-16 LAB — POCT GLYCOSYLATED HEMOGLOBIN (HGB A1C): Hemoglobin A1C: 6.2

## 2014-10-16 MED ORDER — CITALOPRAM HYDROBROMIDE 20 MG PO TABS: 20.0000 mg | ORAL_TABLET | Freq: Every day | ORAL | Status: DC

## 2014-10-16 MED ORDER — ALBUTEROL SULFATE HFA 108 (90 BASE) MCG/ACT IN AERS: 2.0000 | INHALATION_SPRAY | Freq: Four times a day (QID) | RESPIRATORY_TRACT | Status: DC

## 2014-10-16 MED ORDER — LISINOPRIL-HYDROCHLOROTHIAZIDE 20-12.5 MG PO TABS: 1.0000 | ORAL_TABLET | Freq: Every day | ORAL | Status: DC

## 2014-10-16 MED ORDER — ATENOLOL 25 MG PO TABS: 25.0000 mg | ORAL_TABLET | Freq: Every evening | ORAL | Status: DC

## 2014-10-16 MED ORDER — FLUTICASONE-SALMETEROL 250-50 MCG/DOSE IN AEPB: 1.0000 | INHALATION_SPRAY | Freq: Two times a day (BID) | RESPIRATORY_TRACT | Status: DC

## 2014-10-16 MED ORDER — METFORMIN HCL 1000 MG PO TABS: 1000.0000 mg | ORAL_TABLET | Freq: Two times a day (BID) | ORAL | Status: DC

## 2014-10-16 NOTE — Progress Notes (Signed)
Name: Isaac Cross   MRN: [id number]    DOB: [birth year]   Date:10/16/2014       Progress Note  Subjective  Chief Complaint  Chief Complaint  Patient presents with  . Medication Refill    4 month F/U  . Hypertension  . Depression    Improving  . Gastrophageal Reflux    controlled with medication  . Diabetes    Does not check sugar at home-no problems  . Hyperlipidemia    HPI  DMII:  Not checking fsbs at home, but glucose has been at goal, hgbA1C 5.6 twice in a row.  Taking Kombyglize and denies side effects. He has ED secondary to DM neuropathy, but can't take medication secondary to optic neuropathy.  He has been doing well, denies hypoglycemic episodes.   HTN: taking medication, bp is at goal, denies side effects, no hypoglycemic episodes  Depression: taking Citalopram, in remission. Denies any symptoms of depression or anxiety at this time.   GERD: under control with otc Prilosec, discussed winning off medication  Hyperlipidemia: last labs done in March 2016 and LDL was 68, taking Crestor and denies myalgia  Asthma Mild Intermittent:  He has been off Advair for the past month and has SOB when very hot outside, maybe once a week, uses rescue inhaler at most once a week.  No nocturnal symptoms.   OSA: complaint with CPAP, wears all night and feels rested when he wakes up.   Patient Active Problem List   Diagnosis Date Noted  . Asthma, mild intermittent, well-controlled 10/16/2014  . Allergic rhinitis 10/13/2014  . ED (erectile dysfunction) of organic origin 07/22/2014  . Gastro-esophageal reflux disease without esophagitis 07/22/2014  . Benign hypertension 07/22/2014  . Diabetes 07/22/2014  . Adult BMI 30+ 07/22/2014  . Chronic obstructive asthma 07/22/2014  . Depression with anxiety 07/22/2014  . Dyslipidemia 07/22/2014  . Genital herpes 07/22/2014  . 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 Localized osteoarthrosis, lower leg 07/22/2014  . Obstructive  apnea 05/14/2013  . 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 Blood in the urine 09/20/2006    Past Surgical History  Procedure Laterality Date  . Vein surgery Right   . Bladder repair    . Hand surgery      4TH AND 5TH FINGER  . Knee arthroscopy Right     Family History  Problem Relation Age of Onset  . Hypertension Mother   . Diabetes Mother   . Diabetes Father   . Hypertension Father   . Cancer Father   . Seizures Daughter   . Cancer Maternal Grandfather     Prostate  . Diabetes Paternal Grandfather     History   Social History  . Marital Status: Married    Spouse Name: N/A  . Number of Children: N/A  . Years of Education: N/A   Occupational History  . Not on file.   Social History Main Topics  . Smoking status: Former Smoker -- 1.00 packs/day for 10 years    Types: Cigarettes    Start date: 04/20/1995    Quit date: 04/19/2005  . Smokeless tobacco: Never Used  . Alcohol Use: 0.0 oz/week    0 Standard drinks or equivalent per week     Comment: occasional  . Drug Use: No  . Sexual Activity:    Partners: Female   Other Topics Concern  . Not on file   Social History Narrative     Current outpatient prescriptions:  .  albuterol (  PROVENTIL HFA;VENTOLIN HFA) 108 (90 BASE) MCG/ACT inhaler, Inhale 2 puffs into the lungs 4 (four) times daily., Disp: 1 Inhaler, Rfl: 0 .  aspirin 81 MG tablet, Take 81 mg by mouth daily., Disp: , Rfl:  .  atenolol (TENORMIN) 25 MG tablet, Take 1 tablet (25 mg total) by mouth every evening., Disp: 90 tablet, Rfl: 1 .  cetirizine (ZYRTEC) 10 MG tablet, Take 10 mg by mouth daily., Disp: , Rfl:  .  citalopram (CELEXA) 20 MG tablet, Take 1 tablet (20 mg total) by mouth daily., Disp: 90 tablet, Rfl: 1 .  diclofenac (VOLTAREN) 50 MG EC tablet, Take 50 mg by mouth 2 (two) times daily., Disp: , Rfl:  .  fluticasone (FLONASE) 50 MCG/ACT nasal spray, Place 2 sprays into both nostrils at bedtime., Disp: , Rfl:  .  Fluticasone-Salmeterol (ADVAIR)  250-50 MCG/DOSE AEPB, Inhale 1 puff into the lungs 2 (two) times daily., Disp: 60 each, Rfl: 5 .  glucose blood test strip, 1 strip by Other route 2 (two) times daily., Disp: , Rfl:  .  lisinopril-hydrochlorothiazide (PRINZIDE,ZESTORETIC) 20-12.5 MG per tablet, Take 1 tablet by mouth daily., Disp: 90 tablet, Rfl: 1 .  Omega-3 Fatty Acids (FISH OIL) 1000 MG CAPS, Take by mouth daily., Disp: , Rfl:  .  omeprazole (PRILOSEC) 20 MG capsule, Take 20 mg by mouth daily., Disp: , Rfl:  .  rosuvastatin (CRESTOR) 20 MG tablet, Take by mouth., Disp: , Rfl:  .  metFORMIN (GLUCOPHAGE) 1000 MG tablet, Take 1 tablet (1,000 mg total) by mouth 2 (two) times daily with a meal., Disp: 180 tablet, Rfl: 1  Allergies  Allergen Reactions  . Lipitor [Atorvastatin] Hives and Itching  . Viagra  [Sildenafil Citrate]     vision loss     ROS  Constitutional: Negative for fever or significant  weight change.  Respiratory: Negative for cough and shortness of breath.   Cardiovascular: Negative for chest pain or palpitations.  Gastrointestinal: Negative for abdominal pain, no bowel changes.  Musculoskeletal: Negative for gait problem or joint swelling. Chronic knee pain, bilaterally  Skin: Negative for rash.  Neurological: Negative for dizziness or headache.  No other specific complaints in a complete review of systems (except as listed in HPI above).   Objective  Filed Vitals:   10/16/14 0858  BP: 124/68  Pulse: 99  Temp: 98.4 F (36.9 C)  TempSrc: Oral  Resp: 18  Height: 6' 3.5" (1.918 m)  Weight: 318 lb 12.8 oz (144.607 kg)  SpO2: 93%    Body mass index is 39.31 kg/(m^2).  Physical Exam  Constitutional: Patient appears well-developed and well-nourished. No distress. Gigantic  Eyes:  No scleral icterus. PERL Neck: Normal range of motion. Neck supple. Cardiovascular: Normal rate, regular rhythm and normal heart sounds.  No murmur heard. No BLE edema. Pulmonary/Chest: Effort normal and breath  sounds normal. No respiratory distress. Abdominal: Soft.  There is no tenderness. Psychiatric: Patient has a normal mood and affect. behavior is normal. Judgment and thought content normal. Neuro: normal monofilament test Muscular skeletal: crepitus with extension of both knees, no effusion  Recent Results (from the past 2160 hour(s))  POCT HgB A1C     Status: None   Collection Time: 10/16/14  9:08 AM  Result Value Ref Range   Hemoglobin A1C 6.2     Diabetic Foot Exam: Diabetic Foot Exam - Simple   Simple Foot Form  Visual Inspection  See comments:  Yes  Sensation Testing  Intact to touch and monofilament  testing bilaterally:  Yes  Pulse Check  Posterior Tibialis and Dorsalis pulse intact bilaterally:  Yes  Comments  Brittle toe nails       PHQ2/9: Depression screen PHQ 2/9 10/16/2014  Decreased Interest 0  Down, Depressed, Hopeless 0  PHQ - 2 Score 0     Fall Risk: Fall Risk  10/16/2014  Falls in the past year? No    Assessment & Plan  1. Diabetic polyneuropathy associated with type 2 diabetes mellitus We will decrease medication, since hgBA1C is at goal, down to metformin twice daily  - POCT HgB A1C - metFORMIN (GLUCOPHAGE) 1000 MG tablet; Take 1 tablet (1,000 mg total) by mouth 2 (two) times daily with a meal.  Dispense: 180 tablet; Refill: 1  2. ED (erectile dysfunction) of organic origin Can't take PD5 inhibitors per ophthalmologist recommendation.   3. Benign hypertension At goal  - atenolol (TENORMIN) 25 MG tablet; Take 1 tablet (25 mg total) by mouth every evening.  Dispense: 90 tablet; Refill: 1 - lisinopril-hydrochlorothiazide (PRINZIDE,ZESTORETIC) 20-12.5 MG per tablet; Take 1 tablet by mouth daily.  Dispense: 90 tablet; Refill: 1  4. Dyslipidemia At goal   5. Depression with anxiety  - citalopram (CELEXA) 20 MG tablet; Take 1 tablet (20 mg total) by mouth daily.  Dispense: 90 tablet; Refill: 1  6. Obstructive sleep apnea Continue CPAP daily    7. Asthma, mild intermittent, well-controlled Taking medication prn now, doing well  - Fluticasone-Salmeterol (ADVAIR) 250-50 MCG/DOSE AEPB; Inhale 1 puff into the lungs 2 (two) times daily.  Dispense: 60 each; Refill: 5 - albuterol (PROVENTIL HFA;VENTOLIN HFA) 108 (90 BASE) MCG/ACT inhaler; Inhale 2 puffs into the lungs 4 (four) times daily.  Dispense: 1 Inhaler; Refill: 0

## 2014-10-16 NOTE — Patient Instructions (Signed)

## 2014-11-25 ENCOUNTER — Encounter: Payer: Self-pay | Admitting: Family Medicine

## 2015-01-01 ENCOUNTER — Encounter (INDEPENDENT_AMBULATORY_CARE_PROVIDER_SITE_OTHER): Payer: Self-pay

## 2015-01-01 ENCOUNTER — Ambulatory Visit (INDEPENDENT_AMBULATORY_CARE_PROVIDER_SITE_OTHER): Payer: BLUE CROSS/BLUE SHIELD | Admitting: Family Medicine

## 2015-01-01 ENCOUNTER — Encounter: Payer: Self-pay | Admitting: Family Medicine

## 2015-01-01 VITALS — BP 124/68 | HR 89 | Temp 98.1°F | Resp 16 | Ht 76.0 in | Wt 323.3 lb

## 2015-01-01 DIAGNOSIS — Z719 Counseling, unspecified: Secondary | ICD-10-CM

## 2015-01-01 DIAGNOSIS — Z23 Encounter for immunization: Secondary | ICD-10-CM

## 2015-01-01 DIAGNOSIS — Z125 Encounter for screening for malignant neoplasm of prostate: Secondary | ICD-10-CM | POA: Diagnosis not present

## 2015-01-01 DIAGNOSIS — Z0000 Encounter for general adult medical examination without abnormal findings: Secondary | ICD-10-CM | POA: Diagnosis not present

## 2015-01-01 DIAGNOSIS — Z7189 Other specified counseling: Secondary | ICD-10-CM | POA: Diagnosis not present

## 2015-01-01 DIAGNOSIS — Z1211 Encounter for screening for malignant neoplasm of colon: Secondary | ICD-10-CM | POA: Diagnosis not present

## 2015-01-01 NOTE — Progress Notes (Signed)
Name: Isaac Cross   MRN: [id number]    DOB: [birth year]   Date:01/01/2015       Progress Note  Subjective  Chief Complaint  Chief Complaint  Patient presents with  . Annual Exam    HPI  Well male: doing well, no complaints Discussed current USPTF guidelines during CPE  Patient Active Problem List   Diagnosis Date Noted  . Asthma, mild intermittent, well-controlled 10/16/2014  . Allergic rhinitis 10/13/2014  . ED (erectile dysfunction) of organic origin 07/22/2014  . Gastro-esophageal reflux disease without esophagitis 07/22/2014  . Benign hypertension 07/22/2014  . Diabetes 07/22/2014  . Adult BMI 30+ 07/22/2014  . Chronic obstructive asthma 07/22/2014  . Depression with anxiety 07/22/2014  . Dyslipidemia 07/22/2014  . Genital herpes 07/22/2014  . 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 Localized osteoarthrosis, lower leg 07/22/2014  . Obstructive apnea 05/14/2013  . 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 Blood in the urine 09/20/2006    Past Surgical History  Procedure Laterality Date  . Vein surgery Right   . Bladder repair    . Hand surgery      4TH AND 5TH FINGER  . Knee arthroscopy Right     Family History  Problem Relation Age of Onset  . Hypertension Mother   . Diabetes Mother   . Diabetes Father   . Hypertension Father   . Cancer Father   . Seizures Daughter   . Cancer Maternal Grandfather     Prostate  . Diabetes Paternal Grandfather     Social History   Social History  . Marital Status: Married    Spouse Name: N/A  . Number of Children: N/A  . Years of Education: N/A   Occupational History  . Not on file.   Social History Main Topics  . Smoking status: Former Smoker -- 1.00 packs/day for 10 years    Types: Cigarettes    Start date: 04/20/1995    Quit date: 04/19/2005  . Smokeless tobacco: Never Used  . Alcohol Use: 0.0 oz/week    0 Standard drinks or equivalent per week     Comment: occasional  . Drug Use: No  . Sexual  Activity:    Partners: Female   Other Topics Concern  . Not on file   Social History Narrative     Current outpatient prescriptions:  .  albuterol (PROVENTIL HFA;VENTOLIN HFA) 108 (90 BASE) MCG/ACT inhaler, Inhale 2 puffs into the lungs 4 (four) times daily., Disp: 1 Inhaler, Rfl: 0 .  aspirin 81 MG tablet, Take 81 mg by mouth daily., Disp: , Rfl:  .  atenolol (TENORMIN) 25 MG tablet, Take 1 tablet (25 mg total) by mouth every evening., Disp: 90 tablet, Rfl: 1 .  cetirizine (ZYRTEC) 10 MG tablet, Take 10 mg by mouth daily., Disp: , Rfl:  .  citalopram (CELEXA) 20 MG tablet, Take 1 tablet (20 mg total) by mouth daily., Disp: 90 tablet, Rfl: 1 .  diclofenac (VOLTAREN) 50 MG EC tablet, Take 50 mg by mouth 2 (two) times daily., Disp: , Rfl:  .  fluticasone (FLONASE) 50 MCG/ACT nasal spray, Place 2 sprays into both nostrils at bedtime., Disp: , Rfl:  .  Fluticasone-Salmeterol (ADVAIR) 250-50 MCG/DOSE AEPB, Inhale 1 puff into the lungs 2 (two) times daily., Disp: 60 each, Rfl: 5 .  glucose blood test strip, 1 strip by Other route 2 (two) times daily., Disp: , Rfl:  .  lisinopril-hydrochlorothiazide (PRINZIDE,ZESTORETIC) 20-12.5 MG per tablet, Take 1 tablet by  mouth daily., Disp: 90 tablet, Rfl: 1 .  metFORMIN (GLUCOPHAGE) 1000 MG tablet, Take 1 tablet (1,000 mg total) by mouth 2 (two) times daily with a meal., Disp: 180 tablet, Rfl: 1 .  Omega-3 Fatty Acids (FISH OIL) 1000 MG CAPS, Take by mouth daily., Disp: , Rfl:  .  rosuvastatin (CRESTOR) 20 MG tablet, Take by mouth., Disp: , Rfl:   Allergies  Allergen Reactions  . Lipitor [Atorvastatin] Hives and Itching  . Viagra  [Sildenafil Citrate]     vision loss     ROS  Constitutional: Negative for fever or significant weight change.  Respiratory: Negative for cough and shortness of breath.   Cardiovascular: Negative for chest pain or palpitations.  Gastrointestinal: Negative for abdominal pain, no bowel changes.  Musculoskeletal:  Negative for gait problem or joint swelling.  Skin: Negative for rash.  Neurological: Negative for dizziness or headache.  No other specific complaints in a complete review of systems (except as listed in HPI above).  Objective  Filed Vitals:   01/01/15 1209  BP: 124/68  Pulse: 89  Temp: 98.1 F (36.7 C)  TempSrc: Oral  Resp: 16  Height: 6\' 4"  (1.93 m)  Weight: 323 lb 4.8 oz (146.648 kg)  SpO2: 96%    Body mass index is 39.37 kg/(m^2).  Physical Exam  Constitutional: Patient appears well-developed and gigantic No distress.  HENT: Head: Normocephalic and atraumatic. Ears: B TMs ok, no erythema or effusion; Nose: Nose normal. Mouth/Throat: Oropharynx is clear and moist. No oropharyngeal exudate.  Eyes: Conjunctivae and EOM are normal. Pupils are equal, round, and reactive to light. No scleral icterus.  Neck: Normal range of motion. Neck supple. No JVD present. No thyromegaly present.  Cardiovascular: Normal rate, regular rhythm and normal heart sounds.  No murmur heard. No BLE edema. Pulmonary/Chest: Effort normal and breath sounds normal. No respiratory distress. Abdominal: Soft. Bowel sounds are normal, no distension. There is no tenderness. no masses MALE GENITALIA: Normal descended testes bilaterally, no masses palpated, no hernias, no lesions, no discharge RECTAL: Prostate normal size and consistency, no rectal masses or hemorrhoids Musculoskeletal: Normal range of motion, no joint effusions. No gross deformities Neurological: he is alert and oriented to person, place, and time. No cranial nerve deficit. Coordination, balance, strength, speech and gait are normal.  Skin: Skin is warm and dry. No rash noted. No erythema.  Psychiatric: Patient has a normal mood and affect. behavior is normal. Judgment and thought content normal.  Recent Results (from the past 2160 hour(s))  POCT HgB A1C     Status: None   Collection Time: 10/16/14  9:08 AM  Result Value Ref Range    Hemoglobin A1C 6.2      PHQ2/9: Depression screen [facility] 2/9 01/01/2015 10/16/2014  Decreased Interest 0 0  Down, Depressed, Hopeless 0 0  PHQ - 2 Score 0 0    Fall Risk: Fall Risk  01/01/2015 10/16/2014  Falls in the past year? No No      Functional Status Survey: Is the patient deaf or have difficulty hearing?: No Does the patient have difficulty seeing, even when wearing glasses/contacts?: Yes (glasses) Does the patient have difficulty concentrating, remembering, or making decisions?: No Does the patient have difficulty walking or climbing stairs?: No Does the patient have difficulty dressing or bathing?: No Does the patient have difficulty doing errands alone such as visiting a doctor's office or shopping?: No     Assessment & Plan  1. Encounter for routine history and physical exam  for male   2. Needs flu shot  - Flu Vaccine QUAD 36+ mos PF IM (Fluarix & Fluzone Quad PF)  3. Health counseling Discussed importance of 150 minutes of physical activity weekly, eat two servings of fish weekly, eat one serving of tree nuts ( cashews, pistachios, pecans, almonds.Marland Kitchen) every other day, eat 6 servings of fruit/vegetables daily and drink plenty of water and avoid sweet beverages.   4. Prostate cancer screening  - PSA  5. Colon cancer screening  - Ambulatory referral to Gastroenterology

## 2015-01-02 LAB — PSA: Prostate Specific Ag, Serum: 0.7 ng/mL (ref 0.0–4.0)

## 2015-01-02 NOTE — Progress Notes (Signed)
Pt.notified

## 2015-01-07 ENCOUNTER — Telehealth: Payer: Self-pay | Admitting: Gastroenterology

## 2015-01-07 ENCOUNTER — Other Ambulatory Visit: Payer: Self-pay

## 2015-01-07 NOTE — Telephone Encounter (Signed)
Gastroenterology Pre-Procedure Review  Request Date: 04-07-2015 Requesting Physician: Dr.   PATIENT REVIEW QUESTIONS: The patient responded to the following health history questions as indicated:    1. Are you having any GI issues? no 2. Do you have a personal history of Polyps? yes ( ) 3. Do you have a family history of Colon Cancer or Polyps? no 4. Diabetes Mellitus? no 5. Joint replacements in the past 12 months?no 6. Major health problems in the past 3 months?no 7. Any artificial heart valves, MVP, or defibrillator?no    MEDICATIONS & ALLERGIES:    Patient reports the following regarding taking any anticoagulation/antiplatelet therapy:   Plavix, Coumadin, Eliquis, Xarelto, Lovenox, Pradaxa, Brilinta, or Effient? no Aspirin? no  Patient confirms/reports the following medications:  Current Outpatient Prescriptions  Medication Sig Dispense Refill   albuterol (PROVENTIL HFA;VENTOLIN HFA) 108 (90 BASE) MCG/ACT inhaler Inhale 2 puffs into the lungs 4 (four) times daily. 1 Inhaler 0   aspirin 81 MG tablet Take 81 mg by mouth daily.     atenolol (TENORMIN) 25 MG tablet Take 1 tablet (25 mg total) by mouth every evening. 90 tablet 1   cetirizine (ZYRTEC) 10 MG tablet Take 10 mg by mouth daily.     citalopram (CELEXA) 20 MG tablet Take 1 tablet (20 mg total) by mouth daily. 90 tablet 1   diclofenac (VOLTAREN) 50 MG EC tablet Take 50 mg by mouth 2 (two) times daily.     fluticasone (FLONASE) 50 MCG/ACT nasal spray Place 2 sprays into both nostrils at bedtime.     Fluticasone-Salmeterol (ADVAIR) 250-50 MCG/DOSE AEPB Inhale 1 puff into the lungs 2 (two) times daily. 60 each 5   glucose blood test strip 1 strip by Other route 2 (two) times daily.     lisinopril-hydrochlorothiazide (PRINZIDE,ZESTORETIC) 20-12.5 MG per tablet Take 1 tablet by mouth daily. 90 tablet 1   metFORMIN (GLUCOPHAGE) 1000 MG tablet Take 1 tablet (1,000 mg total) by mouth 2 (two) times daily with a meal. 180  tablet 1   Omega-3 Fatty Acids (FISH OIL) 1000 MG CAPS Take by mouth daily.     rosuvastatin (CRESTOR) 20 MG tablet Take by mouth.     No current facility-administered medications for this visit.    Patient confirms/reports the following allergies:  Allergies  Allergen Reactions   Lipitor [Atorvastatin] Hives and Itching   Viagra  [Sildenafil Citrate]     vision loss    No orders of the defined types were placed in this encounter.    AUTHORIZATION INFORMATION Primary Insurance: 1D#: Group #:  Secondary Insurance: 1D#: Group #:  SCHEDULE INFORMATION: Date: 04-07-2015 Time: Location:[facility]

## 2015-02-17 ENCOUNTER — Ambulatory Visit (INDEPENDENT_AMBULATORY_CARE_PROVIDER_SITE_OTHER): Payer: BLUE CROSS/BLUE SHIELD | Admitting: Family Medicine

## 2015-02-17 ENCOUNTER — Encounter: Payer: Self-pay | Admitting: Family Medicine

## 2015-02-17 VITALS — BP 120/72 | HR 91 | Temp 98.4°F | Resp 16 | Ht 76.0 in | Wt 324.6 lb

## 2015-02-17 DIAGNOSIS — J449 Chronic obstructive pulmonary disease, unspecified: Secondary | ICD-10-CM | POA: Diagnosis not present

## 2015-02-17 DIAGNOSIS — R809 Proteinuria, unspecified: Secondary | ICD-10-CM

## 2015-02-17 DIAGNOSIS — E1142 Type 2 diabetes mellitus with diabetic polyneuropathy: Secondary | ICD-10-CM | POA: Diagnosis not present

## 2015-02-17 DIAGNOSIS — J45909 Unspecified asthma, uncomplicated: Secondary | ICD-10-CM | POA: Diagnosis not present

## 2015-02-17 DIAGNOSIS — G4733 Obstructive sleep apnea (adult) (pediatric): Secondary | ICD-10-CM

## 2015-02-17 DIAGNOSIS — F418 Other specified anxiety disorders: Secondary | ICD-10-CM | POA: Diagnosis not present

## 2015-02-17 DIAGNOSIS — N529 Male erectile dysfunction, unspecified: Secondary | ICD-10-CM

## 2015-02-17 DIAGNOSIS — E785 Hyperlipidemia, unspecified: Secondary | ICD-10-CM

## 2015-02-17 DIAGNOSIS — E1129 Type 2 diabetes mellitus with other diabetic kidney complication: Secondary | ICD-10-CM | POA: Diagnosis not present

## 2015-02-17 DIAGNOSIS — N528 Other male erectile dysfunction: Secondary | ICD-10-CM

## 2015-02-17 DIAGNOSIS — I10 Essential (primary) hypertension: Secondary | ICD-10-CM | POA: Diagnosis not present

## 2015-02-17 LAB — POCT UA - MICROALBUMIN: MICROALBUMIN (UR) POC: 100 mg/L

## 2015-02-17 LAB — POCT GLYCOSYLATED HEMOGLOBIN (HGB A1C): Hemoglobin A1C: 5.9

## 2015-02-17 MED ORDER — ROSUVASTATIN CALCIUM 20 MG PO TABS: 20.0000 mg | ORAL_TABLET | Freq: Every day | ORAL | Status: DC

## 2015-02-17 MED ORDER — ATENOLOL 25 MG PO TABS: 25.0000 mg | ORAL_TABLET | Freq: Every evening | ORAL | Status: DC

## 2015-02-17 MED ORDER — LISINOPRIL-HYDROCHLOROTHIAZIDE 20-12.5 MG PO TABS: 1.0000 | ORAL_TABLET | Freq: Every day | ORAL | Status: DC

## 2015-02-17 MED ORDER — CITALOPRAM HYDROBROMIDE 20 MG PO TABS: 20.0000 mg | ORAL_TABLET | Freq: Every day | ORAL | Status: DC

## 2015-02-17 NOTE — Progress Notes (Signed)
Name: Isaac Cross   MRN: [id number]    DOB: Aug 28, 1960   Date:02/17/2015       Progress Note  Subjective  Chief Complaint  Chief Complaint  Patient presents with  . Medication Refill    3 month F/U  . Diabetes    Does not check sugar on a regular basis  . Hypertension  . Hyperlipidemia  . COPD    Well Controlled    HPI   DMII: Not checking fsbs at home, but glucose has been at goal, hgbA1C still at goal at 5.9% Taking Metformin 1000 mg twice daily and denies side effects. He has ED secondary to DM neuropathy, used to have tingling on feet and now has proteinuria on urine micro. He can't take medication secondary to optic neuropathy. He has been doing well, denies hypoglycemic episodes. He is following a diabetic diet  HTN: taking medication, bp is at goal, denies side effects, no hypothension episodes, no palpitation, no chest pain  Depression: taking Citalopram, in remission. Denies any symptoms of depression or anxiety at this time. He does not want to stop medication   GERD: he weaned self off Omeprazole, taking Ranitidine once daily and not symptoms of GERD  Hyperlipidemia: last labs done in March 2016 and LDL was 68, taking Crestor and denies myalgia. He has been eating nuts, but only peanuts at this time - 2 lbs per week. Advised tree nuts instead and small amounts  COPD /Asthma: He has been off Advair for the past 4 months and not recent SOB or wheezing. He has not used rescue in a couple of months now No nocturnal symptoms.   OSA: complaint with CPAP, wears all night and feels rested when he wakes up.   Patient Active Problem List   Diagnosis Date Noted  . Asthma, mild intermittent, well-controlled 10/16/2014  . Allergic rhinitis 10/13/2014  . ED (erectile dysfunction) of organic origin 07/22/2014  . Gastro-esophageal reflux disease without esophagitis 07/22/2014  . Benign hypertension 07/22/2014  . Diabetes ([place]) 07/22/2014  . Adult BMI 30+ 07/22/2014  .  Chronic obstructive asthma ([place]) 07/22/2014  . Depression with anxiety 07/22/2014  . Dyslipidemia 07/22/2014  . Genital herpes 07/22/2014  . 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 Localized osteoarthrosis, lower leg 07/22/2014  . Obstructive apnea 05/14/2013  . 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 Blood in the urine 09/20/2006    Past Surgical History  Procedure Laterality Date  . Vein surgery Right   . Bladder repair    . Hand surgery      4TH AND 5TH FINGER  . Knee arthroscopy Right     Family History  Problem Relation Age of Onset  . Hypertension Mother   . Diabetes Mother   . Diabetes Father   . Hypertension Father   . Cancer Father   . Seizures Daughter   . Cancer Maternal Grandfather     Prostate  . Diabetes Paternal Grandfather     Social History   Social History  . Marital Status: Married    Spouse Name: N/A  . Number of Children: N/A  . Years of Education: N/A   Occupational History  . Not on file.   Social History Main Topics  . Smoking status: Former Smoker -- 1.00 packs/day for 10 years    Types: Cigarettes    Start date: 04/20/1995    Quit date: 04/19/2005  . Smokeless tobacco: Never Used  . Alcohol Use: 6.0 oz/week    0 Standard  drinks or equivalent, 10 Cans of beer per week     Comment: occasional  . Drug Use: No  . Sexual Activity:    Partners: Female   Other Topics Concern  . Not on file   Social History Narrative     Current outpatient prescriptions:  .  albuterol (PROVENTIL HFA;VENTOLIN HFA) 108 (90 BASE) MCG/ACT inhaler, Inhale 2 puffs into the lungs 4 (four) times daily., Disp: 1 Inhaler, Rfl: 0 .  aspirin 81 MG tablet, Take 81 mg by mouth daily., Disp: , Rfl:  .  atenolol (TENORMIN) 25 MG tablet, Take 1 tablet (25 mg total) by mouth every evening., Disp: 90 tablet, Rfl: 1 .  cetirizine (ZYRTEC) 10 MG tablet, Take 10 mg by mouth daily., Disp: , Rfl:  .  citalopram (CELEXA) 20 MG tablet, Take 1 tablet (20 mg total) by  mouth daily., Disp: 90 tablet, Rfl: 1 .  diclofenac (VOLTAREN) 50 MG EC tablet, Take 50 mg by mouth 2 (two) times daily., Disp: , Rfl:  .  fluticasone (FLONASE) 50 MCG/ACT nasal spray, Place 2 sprays into both nostrils at bedtime., Disp: , Rfl:  .  Fluticasone-Salmeterol (ADVAIR) 250-50 MCG/DOSE AEPB, Inhale 1 puff into the lungs 2 (two) times daily., Disp: 60 each, Rfl: 5 .  glucose blood test strip, 1 strip by Other route 2 (two) times daily., Disp: , Rfl:  .  lisinopril-hydrochlorothiazide (PRINZIDE,ZESTORETIC) 20-12.5 MG tablet, Take 1 tablet by mouth daily., Disp: 90 tablet, Rfl: 1 .  metFORMIN (GLUCOPHAGE) 1000 MG tablet, Take 1 tablet (1,000 mg total) by mouth 2 (two) times daily with a meal., Disp: 180 tablet, Rfl: 1 .  Omega-3 Fatty Acids (FISH OIL) 1000 MG CAPS, Take by mouth daily., Disp: , Rfl:  .  rosuvastatin (CRESTOR) 20 MG tablet, Take 1 tablet (20 mg total) by mouth daily., Disp: 90 tablet, Rfl: 1  Allergies  Allergen Reactions  . Lipitor [Atorvastatin] Hives and Itching  . Viagra  [Sildenafil Citrate]     vision loss     ROS  Constitutional: Negative for fever or weight change.  Respiratory: Negative for cough and shortness of breath.   Cardiovascular: Negative for chest pain or palpitations.  Gastrointestinal: Negative for abdominal pain, no bowel changes.  Musculoskeletal: Negative for gait problem or joint swelling.  Skin: Negative for rash.  Neurological: Negative for dizziness or headache.  No other specific complaints in a complete review of systems (except as listed in HPI above).  Objective  Filed Vitals:   02/17/15 0903  BP: 120/72  Pulse: 91  Temp: 98.4 F (36.9 C)  TempSrc: Oral  Resp: 16  Height: 6\' 4"  (1.93 m)  Weight: 324 lb 9.6 oz (147.238 kg)  SpO2: 98%    Body mass index is 39.53 kg/(m^2).  Physical Exam  Constitutional: Patient appears well-developed and gigantic No distress.  HEENT: head atraumatic, normocephalic, pupils equal and  reactive to light, neck supple, throat within normal limits Cardiovascular: Normal rate, regular rhythm and normal heart sounds.  No murmur heard. No BLE edema. Pulmonary/Chest: Effort normal and breath sounds normal. No respiratory distress. Abdominal: Soft.  There is no tenderness. Psychiatric: Patient has a normal mood and affect. behavior is normal. Judgment and thought content normal.  Recent Results (from the past 2160 hour(s))  PSA     Status: None   Collection Time: 01/01/15  2:13 PM  Result Value Ref Range   Prostate Specific Ag, Serum 0.7 0.0 - 4.0 ng/mL    Comment: Roche  ECLIA methodology. According to the American Urological Association, Serum PSA should decrease and remain at undetectable levels after radical prostatectomy. The AUA defines biochemical recurrence as an initial PSA value 0.2 ng/mL or greater followed by a subsequent confirmatory PSA value 0.2 ng/mL or greater. Values obtained with different assay methods or kits cannot be used interchangeably. Results cannot be interpreted as absolute evidence of the presence or absence of malignant disease.   POCT HgB A1C     Status: Normal   Collection Time: 02/17/15  9:09 AM  Result Value Ref Range   Hemoglobin A1C 5.9   POCT UA - Microalbumin     Status: Abnormal   Collection Time: 02/17/15  9:09 AM  Result Value Ref Range   Microalbumin Ur, POC 100 mg/L   Creatinine, POC  mg/dL   Albumin/Creatinine Ratio, Urine, POC      PHQ2/9: Depression screen [facility] 2/9 01/01/2015 10/16/2014  Decreased Interest 0 0  Down, Depressed, Hopeless 0 0  PHQ - 2 Score 0 0    Fall Risk: Fall Risk  01/01/2015 10/16/2014  Falls in the past year? No No     Assessment & Plan  1. Type 2 diabetes mellitus with microalbuminuria, without long-term current use of insulin (HCC)  - POCT HgB A1C - POCT UA - Microalbumin  2. Obstructive apnea  Continue good compliance with medication   3. Benign hypertension  At goal, doing well  -  lisinopril-hydrochlorothiazide (PRINZIDE,ZESTORETIC) 20-12.5 MG tablet; Take 1 tablet by mouth daily.  Dispense: 90 tablet; Refill: 1 - atenolol (TENORMIN) 25 MG tablet; Take 1 tablet (25 mg total) by mouth every evening.  Dispense: 90 tablet; Refill: 1  4. Depression with anxiety  - citalopram (CELEXA) 20 MG tablet; Take 1 tablet (20 mg total) by mouth daily.  Dispense: 90 tablet; Refill: 1  5. Diabetic polyneuropathy associated with type 2 diabetes mellitus ([place])  Doing well, not on medication   6. ED (erectile dysfunction) of organic origin  Not on medication because of vision problem, 2nd nerve palsy  7. Dyslipidemia  - rosuvastatin (CRESTOR) 20 MG tablet; Take 1 tablet (20 mg total) by mouth daily.  Dispense: 90 tablet; Refill: 1

## 2015-04-01 ENCOUNTER — Encounter: Payer: Self-pay | Admitting: *Deleted

## 2015-04-03 NOTE — Discharge Instructions (Signed)

## 2015-04-07 ENCOUNTER — Ambulatory Visit
Admission: RE | Admit: 2015-04-07 | Discharge: 2015-04-07 | Disposition: A | Discharge status: Home / Self Care | Payer: BLUE CROSS/BLUE SHIELD | Source: Ambulatory Visit | Attending: Gastroenterology | Admitting: Gastroenterology

## 2015-04-07 ENCOUNTER — Ambulatory Visit: Payer: BLUE CROSS/BLUE SHIELD | Admitting: Anesthesiology

## 2015-04-07 ENCOUNTER — Encounter
Admission: RE | Disposition: A | Discharge status: Home / Self Care | Payer: Self-pay | Source: Ambulatory Visit | Attending: Gastroenterology

## 2015-04-07 DIAGNOSIS — Z87891 Personal history of nicotine dependence: Secondary | ICD-10-CM | POA: Diagnosis not present

## 2015-04-07 DIAGNOSIS — Z79899 Other long term (current) drug therapy: Secondary | ICD-10-CM | POA: Insufficient documentation

## 2015-04-07 DIAGNOSIS — E7800 Pure hypercholesterolemia, unspecified: Secondary | ICD-10-CM | POA: Insufficient documentation

## 2015-04-07 DIAGNOSIS — K5730 Diverticulosis of large intestine without perforation or abscess without bleeding: Secondary | ICD-10-CM | POA: Insufficient documentation

## 2015-04-07 DIAGNOSIS — Z7982 Long term (current) use of aspirin: Secondary | ICD-10-CM | POA: Diagnosis not present

## 2015-04-07 DIAGNOSIS — K219 Gastro-esophageal reflux disease without esophagitis: Secondary | ICD-10-CM | POA: Diagnosis not present

## 2015-04-07 DIAGNOSIS — Z7984 Long term (current) use of oral hypoglycemic drugs: Secondary | ICD-10-CM | POA: Insufficient documentation

## 2015-04-07 DIAGNOSIS — K641 Second degree hemorrhoids: Secondary | ICD-10-CM | POA: Diagnosis not present

## 2015-04-07 DIAGNOSIS — M1990 Unspecified osteoarthritis, unspecified site: Secondary | ICD-10-CM | POA: Insufficient documentation

## 2015-04-07 DIAGNOSIS — G4730 Sleep apnea, unspecified: Secondary | ICD-10-CM | POA: Insufficient documentation

## 2015-04-07 DIAGNOSIS — I10 Essential (primary) hypertension: Secondary | ICD-10-CM | POA: Insufficient documentation

## 2015-04-07 DIAGNOSIS — Z8249 Family history of ischemic heart disease and other diseases of the circulatory system: Secondary | ICD-10-CM | POA: Diagnosis not present

## 2015-04-07 DIAGNOSIS — Z833 Family history of diabetes mellitus: Secondary | ICD-10-CM | POA: Diagnosis not present

## 2015-04-07 DIAGNOSIS — Z1211 Encounter for screening for malignant neoplasm of colon: Secondary | ICD-10-CM | POA: Diagnosis present

## 2015-04-07 DIAGNOSIS — E119 Type 2 diabetes mellitus without complications: Secondary | ICD-10-CM | POA: Insufficient documentation

## 2015-04-07 HISTORY — DX: Gastro-esophageal reflux disease without esophagitis: K21.9

## 2015-04-07 HISTORY — DX: Unspecified osteoarthritis, unspecified site: M19.90

## 2015-04-07 HISTORY — PX: COLONOSCOPY WITH PROPOFOL: SHX5780

## 2015-04-07 HISTORY — DX: Sleep apnea, unspecified: G47.30

## 2015-04-07 LAB — GLUCOSE, CAPILLARY
GLUCOSE-CAPILLARY: 105 mg/dL — AB (ref 65–99)
Glucose-Capillary: 104 mg/dL — ABNORMAL HIGH (ref 65–99)

## 2015-04-07 SURGERY — COLONOSCOPY WITH PROPOFOL
Anesthesia: Monitor Anesthesia Care | Wound class: Contaminated

## 2015-04-07 MED ORDER — LACTATED RINGERS IV SOLN
INTRAVENOUS | Status: DC
  Administered 2015-04-07: 07:00:00 via INTRAVENOUS

## 2015-04-07 MED ORDER — LIDOCAINE HCL (CARDIAC) 20 MG/ML IV SOLN
INTRAVENOUS | Status: DC | PRN
  Administered 2015-04-07: 40 mg via INTRAVENOUS

## 2015-04-07 MED ORDER — PROPOFOL 10 MG/ML IV BOLUS
INTRAVENOUS | Status: DC | PRN
Start: 2015-04-07 — End: 2015-04-07
  Administered 2015-04-07 (×7): 50 mg via INTRAVENOUS
  Administered 2015-04-07: 100 mg via INTRAVENOUS
  Administered 2015-04-07 (×2): 50 mg via INTRAVENOUS

## 2015-04-07 SURGICAL SUPPLY — 28 items

## 2015-04-07 NOTE — Transfer of Care (Signed)
Immediate Anesthesia Transfer of Care Note  Patient: Isaac Cross  Procedure(s) Performed: Procedure(s) with comments: COLONOSCOPY WITH PROPOFOL (N/A) - Diabetic - oral meds CPAP  Patient Location: PACU  Anesthesia Type: General, MAC  Level of Consciousness: awake, alert  and patient cooperative  Airway and Oxygen Therapy: Patient Spontanous Breathing and Patient connected to supplemental oxygen  Post-op Assessment: Post-op Vital signs reviewed, Patient's Cardiovascular Status Stable, Respiratory Function Stable, Patent Airway and No signs of Nausea or vomiting  Post-op Vital Signs: Reviewed and stable  Complications: No apparent anesthesia complications

## 2015-04-07 NOTE — Anesthesia Preprocedure Evaluation (Signed)
Anesthesia Evaluation    Airway Mallampati: II  TM Distance: >3 FB Neck ROM: Full    Dental no notable dental hx.    Pulmonary sleep apnea , COPD, former smoker,  Well controlled. No home O2   Pulmonary exam normal breath sounds clear to auscultation       Cardiovascular hypertension, Normal cardiovascular exam Rhythm:Regular Rate:Normal  4 + mets   Neuro/Psych    GI/Hepatic GERD  Controlled,  Endo/Other  diabetes, Type 2  Renal/GU      Musculoskeletal  (+) Arthritis ,   Abdominal   Peds  Hematology   Anesthesia Other Findings   Reproductive/Obstetrics                             Anesthesia Physical Anesthesia Plan  ASA: II  Anesthesia Plan: General and MAC   Post-op Pain Management:    Induction: Intravenous  Airway Management Planned:   Additional Equipment:   Intra-op Plan:   Post-operative Plan: Extubation in OR  Informed Consent: I have reviewed the patients History and Physical, chart, labs and discussed the procedure including the risks, benefits and alternatives for the proposed anesthesia with the patient or authorized representative who has indicated his/her understanding and acceptance.   Dental advisory given  Plan Discussed with: CRNA  Anesthesia Plan Comments:         Anesthesia Quick Evaluation

## 2015-04-07 NOTE — Anesthesia Procedure Notes (Signed)
Procedure Name: MAC Performed by: Orpha Dain Pre-anesthesia Checklist: Patient identified, Emergency Drugs available, Suction available, Timeout performed and Patient being monitored Patient Re-evaluated:Patient Re-evaluated prior to inductionOxygen Delivery Method: Nasal cannula Placement Confirmation: positive ETCO2     

## 2015-04-07 NOTE — Op Note (Signed)
[facility] Patient Name: Isaac Cross Procedure Date: 04/07/2015 7:53 AM MRN: [id number] Account #: [id number] Date of Birth: 1960/06/20 Admit Type: Outpatient Age: 54 Room: [facility] OR ROOM 01 Gender: Male Note Status: Finalized Procedure:         Colonoscopy Indications:       Screening for colorectal malignant neoplasm Providers:         Lucilla Lame, MD Referring MD:      Bethena Roys. Sowles, MD (Referring MD) Medicines:         Propofol per Anesthesia Complications:     No immediate complications. Procedure:         Pre-Anesthesia Assessment:                    - Prior to the procedure, a History and Physical was                     performed, and patient medications and allergies were                     reviewed. The patient's tolerance of previous anesthesia                     was also reviewed. The risks and benefits of the procedure                     and the sedation options and risks were discussed with the                     patient. All questions were answered, and informed consent                     was obtained. Prior Anticoagulants: The patient has taken                     no previous anticoagulant or antiplatelet agents. ASA                     Grade Assessment: II - A patient with mild systemic                     disease. After reviewing the risks and benefits, the                     patient was deemed in satisfactory condition to undergo                     the procedure.                    After obtaining informed consent, the colonoscope was                     passed under direct vision. Throughout the procedure, the                     patient's blood pressure, pulse, and oxygen saturations                     were monitored continuously. The was introduced through                     the anus and advanced to the the cecum, identified by  appendiceal orifice and ileocecal valve. The colonoscopy               was performed without difficulty. The patient tolerated                     the procedure well. The quality of the bowel preparation                     was good. Findings:      The perianal and digital rectal examinations were normal.      Multiple small-mouthed diverticula were found in the entire colon.      Non-bleeding internal hemorrhoids were found during retroflexion. The       hemorrhoids were Grade II (internal hemorrhoids that prolapse but reduce       spontaneously). Impression:        - Diverticulosis in the entire examined colon.                    - Non-bleeding internal hemorrhoids.                    - No specimens collected. Recommendation:    - Repeat colonoscopy in 10 years for screening unless any                     change in family history or lower GI problems. Procedure Code(s): --- Professional ---                    [phone], Colonoscopy, flexible; diagnostic, including                     collection of specimen(s) by brushing or washing, when                     performed (separate procedure) Diagnosis Code(s): --- Professional ---                    Z12.11, Encounter for screening for malignant neoplasm of                     colon                    K64.1, Second degree hemorrhoids                    K57.30, Diverticulosis of large intestine without                     perforation or abscess without bleeding CPT copyright 2014 American Medical Association. All rights reserved. The codes documented in this report are preliminary and upon coder review may  be revised to meet current compliance requirements. Lucilla Lame, MD 04/07/2015 8:15:44 AM This report has been signed electronically. Number of Addenda: 0 Note Initiated On: 04/07/2015 7:53 AM Scope Withdrawal Time: 0 hours 9 minutes 56 seconds  Total Procedure Duration: 0 hours 14 minutes 56 seconds       [facility]

## 2015-04-07 NOTE — H&P (Signed)
[facility]  [address], [place], [place] [address] Phone: [phone] Fax : [phone]  Primary Care Physician:  Loistine Chance, MD Primary Gastroenterologist:  Dr. Allen Norris  Pre-Procedure History & Physical: HPI:  Isaac Cross is a 54 y.o. male is here for a screening colonoscopy.   Past Medical History  Diagnosis Date  . Diabetes ([place])   . Swelling   . High cholesterol   . Anxiety   . HBP (high blood pressure)   . GERD (gastroesophageal reflux disease)   . Arthritis     knees  . Sleep apnea     CPAP    Past Surgical History  Procedure Laterality Date  . Vein surgery Right   . Bladder repair    . Hand surgery      4TH AND 5TH FINGER  . Knee arthroscopy Right     Prior to Admission medications   Medication Sig Start Date End Date Taking? Authorizing Provider  albuterol (PROVENTIL HFA;VENTOLIN HFA) 108 (90 BASE) MCG/ACT inhaler Inhale 2 puffs into the lungs 4 (four) times daily. 10/16/14  Yes Steele Sizer, MD  aspirin 81 MG tablet Take 81 mg by mouth daily.   Yes Historical Provider, MD  atenolol (TENORMIN) 25 MG tablet Take 1 tablet (25 mg total) by mouth every evening. 02/17/15  Yes Steele Sizer, MD  cetirizine (ZYRTEC) 10 MG tablet Take 10 mg by mouth daily.   Yes Historical Provider, MD  citalopram (CELEXA) 20 MG tablet Take 1 tablet (20 mg total) by mouth daily. 02/17/15  Yes Steele Sizer, MD  diclofenac (VOLTAREN) 50 MG EC tablet Take 50 mg by mouth 2 (two) times daily. Uses PRN   Yes Historical Provider, MD  fluticasone (FLONASE) 50 MCG/ACT nasal spray Place 2 sprays into both nostrils at bedtime. 12/30/09  Yes Historical Provider, MD  Fluticasone-Salmeterol (ADVAIR) 250-50 MCG/DOSE AEPB Inhale 1 puff into the lungs 2 (two) times daily. 10/16/14  Yes Steele Sizer, MD  glucose blood test strip 1 strip by Other route 2 (two) times daily. 05/23/11  Yes Historical Provider, MD  lisinopril-hydrochlorothiazide (PRINZIDE,ZESTORETIC) 20-12.5  MG tablet Take 1 tablet by mouth daily. 02/17/15  Yes Steele Sizer, MD  metFORMIN (GLUCOPHAGE) 1000 MG tablet Take 1 tablet (1,000 mg total) by mouth 2 (two) times daily with a meal. 10/16/14  Yes Steele Sizer, MD  Omega-3 Fatty Acids (FISH OIL) 1000 MG CAPS Take by mouth daily.   Yes Historical Provider, MD  rosuvastatin (CRESTOR) 20 MG tablet Take 1 tablet (20 mg total) by mouth daily. 02/17/15  Yes Steele Sizer, MD    Allergies as of 01/07/2015 - Review Complete 01/01/2015  Allergen Reaction Noted  . Lipitor [atorvastatin] Hives and Itching 03/02/2013  . Viagra  [sildenafil citrate]  07/20/2014    Family History  Problem Relation Age of Onset  . Hypertension Mother   . Diabetes Mother   . Diabetes Father   . Hypertension Father   . Cancer Father   . Seizures Daughter   . Cancer Maternal Grandfather     Prostate  . Diabetes Paternal Grandfather     Social History   Social History  . Marital Status: Married    Spouse Name: N/A  . Number of Children: N/A  . Years of Education: N/A   Occupational History  . Not on file.   Social History Main Topics  . Smoking status: Former Smoker -- 1.00 packs/day for 10 years    Types: Cigarettes  Start date: 04/20/1995    Quit date: 04/19/2005  . Smokeless tobacco: Never Used  . Alcohol Use: 6.0 oz/week    10 Cans of beer, 0 Standard drinks or equivalent per week     Comment: occasional  . Drug Use: No  . Sexual Activity:    Partners: Female   Other Topics Concern  . Not on file   Social History Narrative    Review of Systems: See HPI, otherwise negative ROS  Physical Exam: BP 125/80 mmHg  Pulse 79  Temp(Src) 98.1 F (36.7 C) (Temporal)  Resp 16  Ht 6\' 4"  (1.93 m)  Wt 324 lb (146.965 kg)  BMI 39.45 kg/m2  SpO2 96% General:   Alert,  pleasant and cooperative in NAD Head:  Normocephalic and atraumatic. Neck:  Supple; no masses or thyromegaly. Lungs:  Clear throughout to auscultation.    Heart:  Regular  rate and rhythm. Abdomen:  Soft, nontender and nondistended. Normal bowel sounds, without guarding, and without rebound.   Neurologic:  Alert and  oriented x4;  grossly normal neurologically.  Impression/Plan: Isaac Cross is now here to undergo a screening colonoscopy.  Risks, benefits, and alternatives regarding colonoscopy have been reviewed with the patient.  Questions have been answered.  All parties agreeable.

## 2015-04-07 NOTE — Anesthesia Postprocedure Evaluation (Signed)
Anesthesia Post Note  Patient: Isaac Cross  Procedure(s) Performed: Procedure(s) (LRB): COLONOSCOPY WITH PROPOFOL (N/A)  Patient location during evaluation: PACU Anesthesia Type: General Level of consciousness: awake and alert Pain management: pain level controlled Vital Signs Assessment: post-procedure vital signs reviewed and stable Respiratory status: spontaneous breathing, nonlabored ventilation, respiratory function stable and patient connected to nasal cannula oxygen Cardiovascular status: blood pressure returned to baseline and stable Postop Assessment: no signs of nausea or vomiting Anesthetic complications: no    Narada Uzzle C

## 2015-04-08 ENCOUNTER — Encounter: Payer: Self-pay | Admitting: Gastroenterology

## 2015-04-17 ENCOUNTER — Other Ambulatory Visit: Payer: Self-pay

## 2015-04-17 DIAGNOSIS — E1142 Type 2 diabetes mellitus with diabetic polyneuropathy: Secondary | ICD-10-CM

## 2015-04-17 MED ORDER — METFORMIN HCL 1000 MG PO TABS: 1000.0000 mg | ORAL_TABLET | Freq: Two times a day (BID) | ORAL | Status: DC

## 2015-04-17 NOTE — Telephone Encounter (Signed)
Patient requesting refill. 

## 2015-04-23 ENCOUNTER — Ambulatory Visit (INDEPENDENT_AMBULATORY_CARE_PROVIDER_SITE_OTHER): Payer: BLUE CROSS/BLUE SHIELD | Admitting: Urology

## 2015-04-23 ENCOUNTER — Encounter: Payer: Self-pay | Admitting: Urology

## 2015-04-23 VITALS — BP 109/73 | HR 93 | Ht 77.0 in | Wt 324.0 lb

## 2015-04-23 DIAGNOSIS — N400 Enlarged prostate without lower urinary tract symptoms: Secondary | ICD-10-CM

## 2015-04-23 DIAGNOSIS — R3129 Other microscopic hematuria: Secondary | ICD-10-CM | POA: Diagnosis not present

## 2015-04-23 DIAGNOSIS — N281 Cyst of kidney, acquired: Secondary | ICD-10-CM

## 2015-04-23 DIAGNOSIS — N529 Male erectile dysfunction, unspecified: Secondary | ICD-10-CM

## 2015-04-23 DIAGNOSIS — Q6100 Congenital renal cyst, unspecified: Secondary | ICD-10-CM

## 2015-04-23 LAB — URINALYSIS, COMPLETE
Bilirubin, UA: NEGATIVE
Glucose, UA: NEGATIVE
Ketones, UA: NEGATIVE
Leukocytes, UA: NEGATIVE
Nitrite, UA: NEGATIVE
Specific Gravity, UA: 1.015 (ref 1.005–1.030)
Urobilinogen, Ur: 1 mg/dL (ref 0.2–1.0)
pH, UA: 7 (ref 5.0–7.5)

## 2015-04-23 LAB — MICROSCOPIC EXAMINATION: Epithelial Cells (non renal): NONE SEEN /HPF

## 2015-04-23 NOTE — Progress Notes (Signed)
04/23/2015 9:42 AM   Isaac Cross October 20, 1960 [id number]  Referring provider: Steele Sizer, MD [address] [place], [place] [address]  Chief Complaint  Patient presents with  . Renal Cyst    New Patient    HPI: 55 yo M previously patient of Dr. Shary Decamp followed for history of microscopic hematuria, right indeterminate renal mass and BPH. He was last seen in the office in 2010 and seeks to reestablish care today.  Today, he denies any voiding complaints including urinary urgency, frequency, weak stream, or incomplete bladder emptying.  He gets up only once at night to void and has no complaints. No history of UTIs. No gross hematuria.  He currently takes no medications for his prostate.  He does report a history of remote voiding difficulty/prostatitis and underwent what sounds like TUIBN by Dr. Ernst Spell.    CT Urogram in 2010 (presumably for the workup of microscopic hematuria) which showed a benign 2 cm right renal cyst along with a 1 cm partially exophytic borderline enhancing lesion on the posterior lower pole of the right kidney (precontrast Hounsfield units 12, postcontrast 35).   No other GU pathology was identified on the scan and he said no follow-up imaging since then. He denies any flank pain.  He also has a history of erectile dysfunction. He took Viagra in the remote past but developed her show vision loss in one of his eyes which he relates to this medication. He is not interested in any further treatment for ED.    PMH: Past Medical History  Diagnosis Date  . Diabetes ([place])   . Swelling   . High cholesterol   . Anxiety   . HBP (high blood pressure)   . GERD (gastroesophageal reflux disease)   . Arthritis     knees  . Sleep apnea     CPAP    Surgical History: Past Surgical History  Procedure Laterality Date  . Vein surgery Right   . Bladder repair    . Hand surgery      4TH AND 5TH FINGER  . Knee arthroscopy Right   . Colonoscopy  with propofol N/A 04/07/2015    Procedure: COLONOSCOPY WITH PROPOFOL;  Surgeon: Lucilla Lame, MD;  Location: [facility];  Service: Endoscopy;  Laterality: N/A;  Diabetic - oral meds CPAP    Home Medications:    Medication List       This list is accurate as of: 04/23/15  9:42 AM.  Always use your most recent med list.               albuterol 108 (90 Base) MCG/ACT inhaler  Commonly known as:  PROVENTIL HFA;VENTOLIN HFA  Inhale 2 puffs into the lungs 4 (four) times daily.     aspirin 81 MG tablet  Take 81 mg by mouth daily.     atenolol 25 MG tablet  Commonly known as:  TENORMIN  Take 1 tablet (25 mg total) by mouth every evening.     cetirizine 10 MG tablet  Commonly known as:  ZYRTEC  Take 10 mg by mouth daily.     citalopram 20 MG tablet  Commonly known as:  CELEXA  Take 1 tablet (20 mg total) by mouth daily.     diclofenac 50 MG EC tablet  Commonly known as:  VOLTAREN  Take 50 mg by mouth 2 (two) times daily. Reported on 04/23/2015     Fish Oil 1000 MG Caps  Take by mouth  daily.     glucose blood test strip  1 strip by Other route 2 (two) times daily.     lisinopril-hydrochlorothiazide 20-12.5 MG tablet  Commonly known as:  PRINZIDE,ZESTORETIC  Take 1 tablet by mouth daily.     metFORMIN 1000 MG tablet  Commonly known as:  GLUCOPHAGE  Take 1 tablet (1,000 mg total) by mouth 2 (two) times daily with a meal.     rosuvastatin 20 MG tablet  Commonly known as:  CRESTOR  Take 1 tablet (20 mg total) by mouth daily.        Allergies:  Allergies  Allergen Reactions  . Lipitor [Atorvastatin] Hives and Itching  . Viagra  [Sildenafil Citrate]     vision loss    Family History: Family History  Problem Relation Age of Onset  . Hypertension Mother   . Diabetes Mother   . Diabetes Father   . Hypertension Father   . Cancer Father   . Seizures Daughter   . Cancer Maternal Grandfather     Prostate  . Diabetes Paternal Grandfather   . Bladder Cancer  Neg Hx   . Prostate cancer Maternal Uncle   . Kidney cancer Neg Hx     Social History:  reports that he quit smoking about 10 years ago. His smoking use included Cigarettes. He started smoking about 20 years ago. He has a 10 pack-year smoking history. He has never used smokeless tobacco. He reports that he drinks about 6.0 oz of alcohol per week. He reports that he does not use illicit drugs.  ROS: UROLOGY Frequent Urination?: No Hard to postpone urination?: No Burning/pain with urination?: No Get up at night to urinate?: Yes Leakage of urine?: No Urine stream starts and stops?: No Trouble starting stream?: No Do you have to strain to urinate?: No Blood in urine?: Yes Urinary tract infection?: No Sexually transmitted disease?: No Injury to kidneys or bladder?: No Painful intercourse?: No Weak stream?: No Erection problems?: Yes Penile pain?: No  Gastrointestinal Nausea?: No Vomiting?: No Indigestion/heartburn?: Yes Diarrhea?: No Constipation?: No  Constitutional Fever: No Night sweats?: No Weight loss?: No Fatigue?: No  Skin Skin rash/lesions?: No Itching?: No  Eyes Blurred vision?: Yes Double vision?: No  Ears/Nose/Throat Sore throat?: No Sinus problems?: No  Hematologic/Lymphatic Swollen glands?: No Easy bruising?: No  Cardiovascular Leg swelling?: No Chest pain?: No  Respiratory Cough?: No Shortness of breath?: No  Endocrine Excessive thirst?: No  Musculoskeletal Back pain?: No Joint pain?: Yes  Neurological Headaches?: No Dizziness?: No  Psychologic Depression?: No Anxiety?: No  Physical Exam: BP 109/73 mmHg  Pulse 93  Ht 6\' 5"  (1.956 m)  Wt 324 lb (146.965 kg)  BMI 38.41 kg/m2  Constitutional:  Alert and oriented, No acute distress. HEENT: [place] AT, moist mucus membranes.  Trachea midline, no masses. Cardiovascular: No clubbing, cyanosis.  Right greater than left lower extremity edema, right varicose veins noted. Respiratory:  Normal respiratory effort, no increased work of breathing. GI: Abdomen is soft, nontender, nondistended, no abdominal masses.  Reducible umbilical hernia noted. GU: No CVA tenderness.  Rectal exam deferred today, performed in October 2016 by PCP. Skin: No rashes, bruises or suspicious lesions. Lymph: No cervical adenopathy. Neurologic: Grossly intact, no focal deficits, moving all 4 extremities. Psychiatric: Normal mood and affect.  Laboratory Data:  Lab Results  Component Value Date   CREATININE 1.2 06/17/2014    Lab Results  Component Value Date   PSA 0.7 01/01/2015   PSA 0.7 01/10/2012  PSA  09/19/2010    ========== TEST NAME ==========  ========= RESULTS =========  = REFERENCE RANGE =  PROSTATE-SPECIFIC AG.  PSA, Serum (Serial Monitor) Prostate Specific Ag, Serum     [   0.6 ng/mL            ]        Not Estab. Roche ECLIA methodology.                                                                      . According to the American Urological Association, Serum PSA should decrease and remain at undetectable levels after radical prostatectomy. The AUA defines biochemical recurrence as an initial PSA value 0.2 ng/mL or greater followed by a subsequent confirmatory PSA value 0.2 ng/mL or greater. Values obtained with different assay methods or kits cannot be used interchangeably. Results cannot be interpreted as absolute evidence of the presence or absence of malignant disease.               [facility]            No: [id number]           [address], [place], [place] [address]           Lindon Romp, MD         [phone]   Result(s) reported on 21 Sep 2010 at 11:19PM.      Lab Results  Component Value Date   HGBA1C 5.9 02/17/2015    Urinalysis Urine dipstick shows positive for RBC's and positive for protein.  Micro exam: 3-10 RBC's per HPF.   Pertinent Imaging:  CT urogram from 2010 reviewed personally today, as above.  Assessment &  Plan:    1. Renal cyst, right First identified on CT urogram in 2010, 1 cm right indeterminate renal cyst. Recommend follow-up imaging in the form of CT urogram given the presence of microscopic hematuria today. - Urinalysis, Complete - CT Abdomen Pelvis W Wo Contrast; Future  2. Microscopic hematuria Asymptomatic microscopic hematuria today, recommend repeat UA to assess need for cystoscopy per AUA guidelines. He will return in 2 weeks for UA and if microscopic hematuria present, will plan for cystoscopy to complete workup. He has had workup for this in the remote past. - CT Abdomen Pelvis W Wo Contrast; Future -UA in 2 weeks to reassess for microscopic hematuria - Consider cysto if microscopic hematuria present x 2  3. BPH (benign prostatic hyperplasia) History of BPH status post TUIBN. No voiding complaints today.  Prostate cancer screening performed by PCP in October 2016, PSA 0.7.  4. Erectile dysfunction, unspecified erectile dysfunction type No treatment desired.   Return in about 4 weeks (around 05/21/2015) for cystoscopy, f/u CT urogram (also lab visit 2 weeks for UA only).  [name], MD  [facility] [address], [place], [place] [address] [phone]

## 2015-04-25 ENCOUNTER — Ambulatory Visit: Payer: Self-pay | Admitting: Urology

## 2015-04-30 ENCOUNTER — Ambulatory Visit
Admission: RE | Admit: 2015-04-30 | Discharge: 2015-04-30 | Disposition: A | Discharge status: Home / Self Care | Payer: BLUE CROSS/BLUE SHIELD | Source: Ambulatory Visit | Attending: Urology | Admitting: Urology

## 2015-04-30 DIAGNOSIS — K5730 Diverticulosis of large intestine without perforation or abscess without bleeding: Secondary | ICD-10-CM | POA: Insufficient documentation

## 2015-04-30 DIAGNOSIS — Q6100 Congenital renal cyst, unspecified: Secondary | ICD-10-CM | POA: Insufficient documentation

## 2015-04-30 DIAGNOSIS — K760 Fatty (change of) liver, not elsewhere classified: Secondary | ICD-10-CM | POA: Diagnosis not present

## 2015-04-30 DIAGNOSIS — N281 Cyst of kidney, acquired: Secondary | ICD-10-CM

## 2015-04-30 DIAGNOSIS — R3129 Other microscopic hematuria: Secondary | ICD-10-CM | POA: Insufficient documentation

## 2015-04-30 IMAGING — CT CT ABD-PEL WO/W CM
2 of 10 series · 10 of 46 positions shown, 16 images · IV contrast (omnipaque)
Comparison: Prior CTs [DATE] and [DATE].

CLINICAL DATA: Recurrent painless microscopic hematuria.

EXAM:
CT ABDOMEN AND PELVIS WITHOUT AND WITH CONTRAST
TECHNIQUE: Multidetector CT imaging of the abdomen and pelvis was performed
following the standard protocol before and following the bolus
administration of intravenous contrast.
CONTRAST:  125mL OMNIPAQUE IOHEXOL 300 MG/ML  SOLN

[Series 5: cor hematuria > 45 wo · coronal · 0.89mm/px · 2 of 175 slices shown, 3 images]
[im 59/175  soft-tissue]
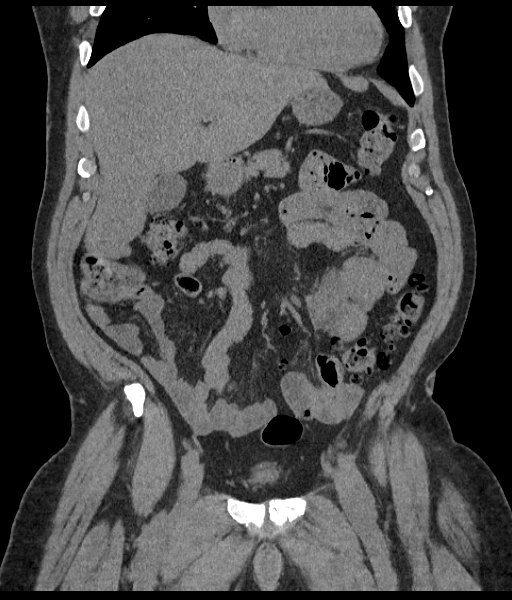
[im 59/175  bone]
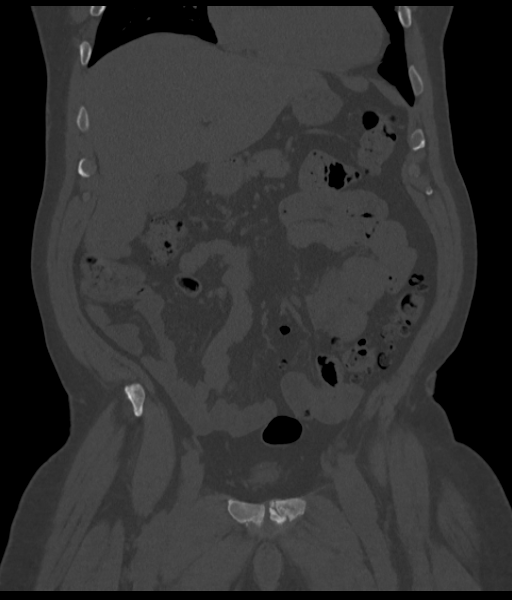
[im 117/175  soft-tissue]
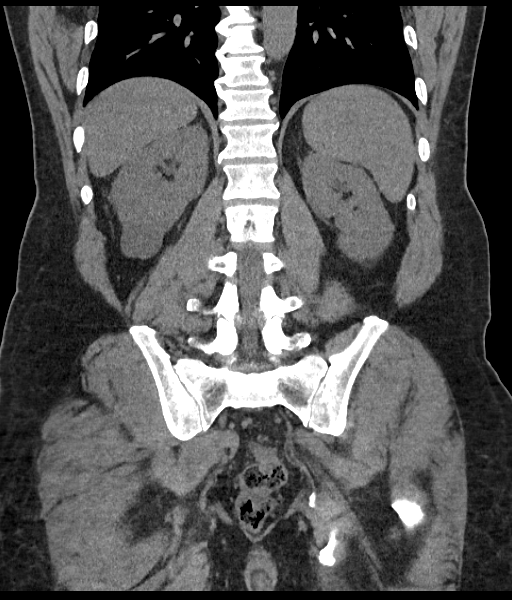

[Series 12: delay · axial · delayed · 0.86mm/px · z∈[-1080,-650]mm · 8 of 112 slices shown, 13 images]
[im 13/112  soft-tissue]
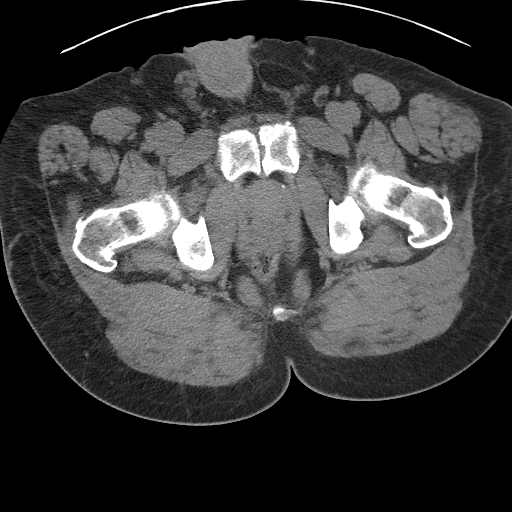
[im 13/112  bone]
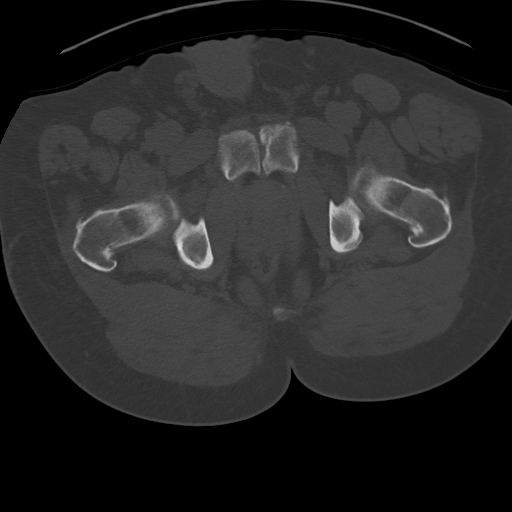
[im 25/112  soft-tissue]
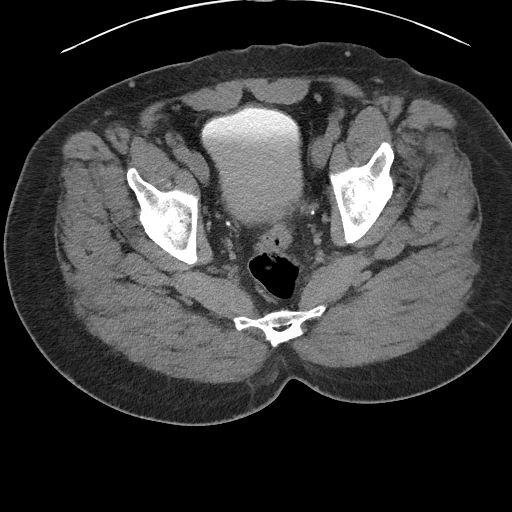
[im 38/112  soft-tissue]
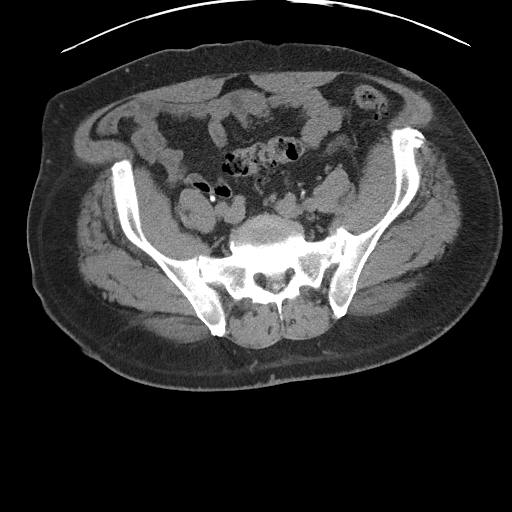
[im 50/112  soft-tissue]
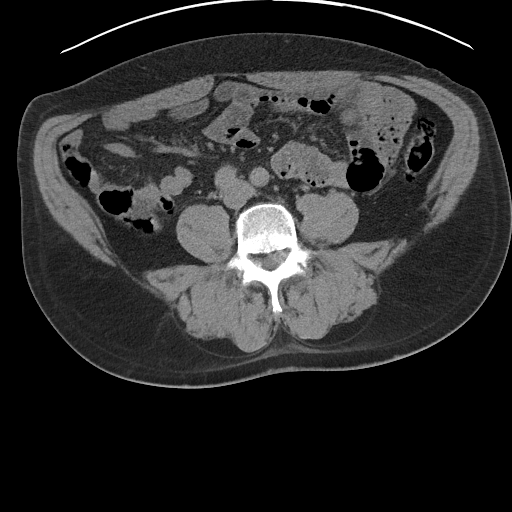
[im 62/112  soft-tissue]
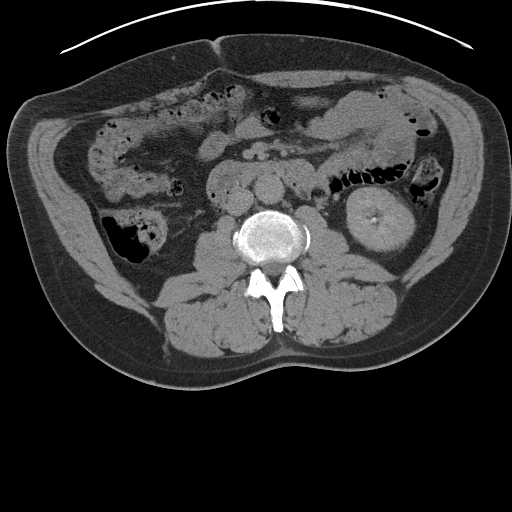
[im 62/112  lung]
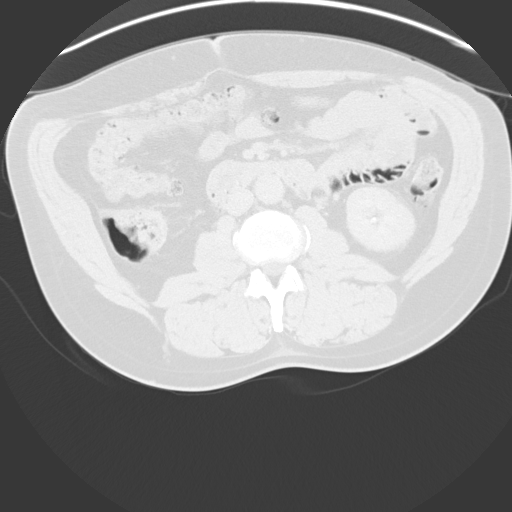
[im 75/112  soft-tissue]
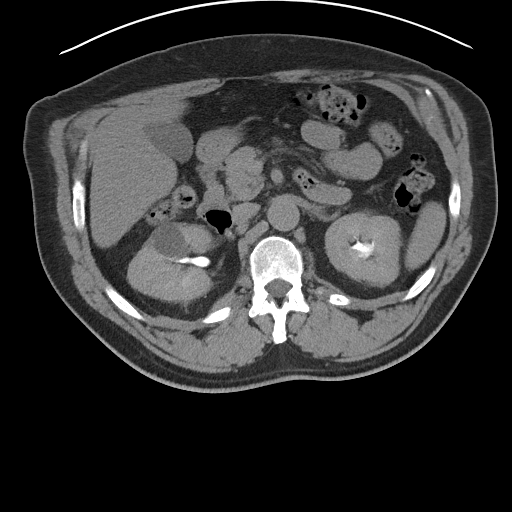
[im 75/112  lung]
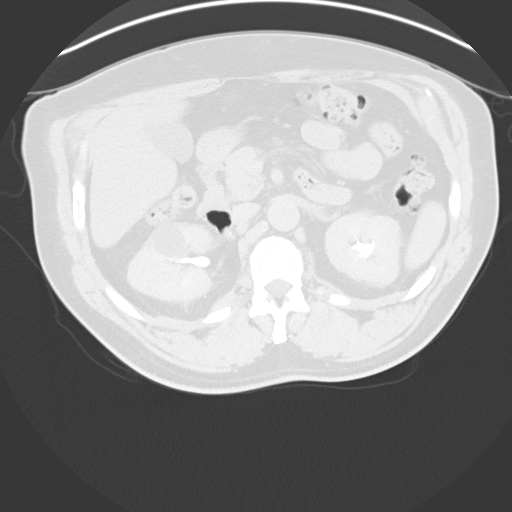
[im 87/112  soft-tissue]
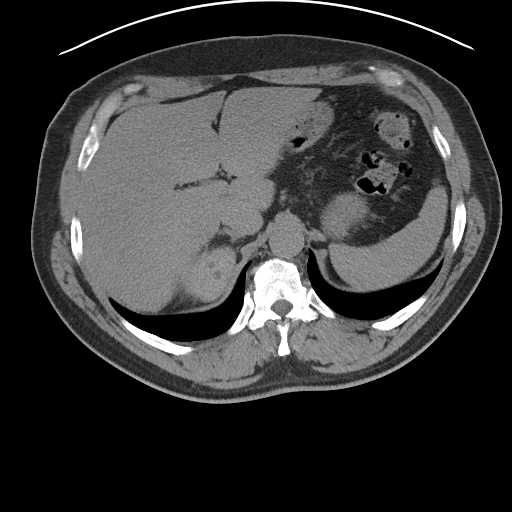
[im 87/112  lung]
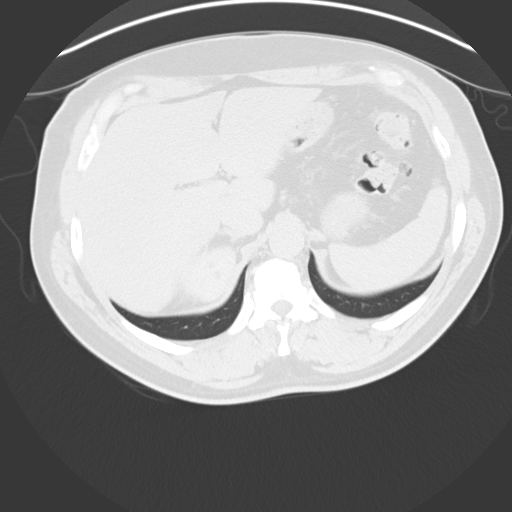
[im 99/112  soft-tissue]
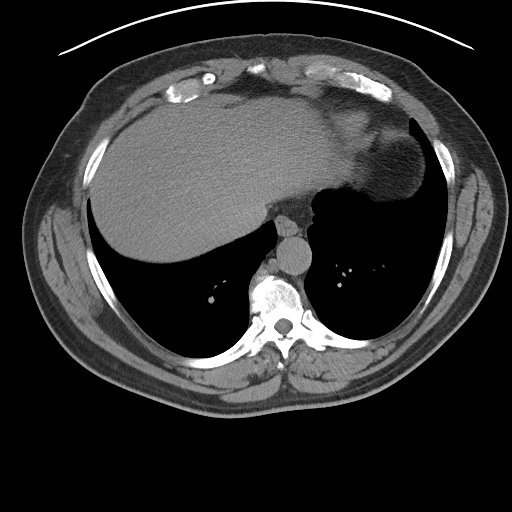
[im 99/112  lung]
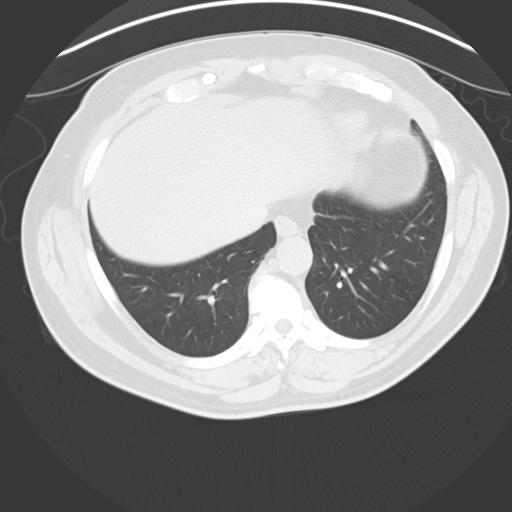

[10 of 46 positions shown; findings below may reference images not displayed]

FINDINGS: Lower chest: Emphysematous changes and mild atelectasis are present
at both lung bases. There is no significant pleural or pericardial
effusion.

Hepatobiliary: The hepatic density is diffusely decreased consistent
with steatosis. Postcontrast, no focal lesions or abnormal
enhancement identified. No evidence of gallstones, gallbladder wall
thickening or biliary dilatation.

Pancreas: Unremarkable. No pancreatic ductal dilatation or
surrounding inflammatory changes.

Spleen: Normal in size without focal abnormality.

Adrenals/Urinary Tract: Both adrenal glands appear normal.
Pre-contrast images demonstrate no renal, ureteral or bladder
calculi. Post-contrast, both kidneys enhance normally. There is no
evidence of enhancing renal mass. There are enlarging right renal
cyst. Anteriorly in the interpolar region, there is a 3.3 x 2.5 cm
cyst which previously measured 2.3 cm maximally. In the lower pole
of the right kidney, there is a 3.5 cm cyst which previously
measured 10 mm maximally. There are other smaller low-density right
renal lesions which are too small to characterize, but no enhancing
or otherwise suspicious findings are demonstrated. The left kidney
appears unremarkable. Delayed images result in segmental
visualization of the ureters. No urothelial abnormalities are
identified. The bladder appears unremarkable.

Stomach/Bowel: No evidence of bowel wall thickening, distention or
surrounding inflammatory change. Diverticular changes are present
throughout the distal colon. The appendix appears normal.

Vascular/Lymphatic: There are no enlarged abdominal or pelvic lymph
nodes. Mild aortoiliac atherosclerosis.

Reproductive: Unremarkable.

Other: There is stable prominent fat in both inguinal canals and a
stable small umbilical hernia containing only fat.

Musculoskeletal: No acute or significant osseous findings. There are
mild degenerative changes throughout the spine.
IMPRESSION: 1. No explanation for hematuria. Specifically, no evidence of
urinary tract calculus, renal mass or urothelial lesion.
2. There are several enlarging right renal cysts. Some of these are
too small to optimally characterize, but no suspicious lesions are
demonstrated.
3. Hepatic steatosis, mild distal colonic diverticulosis and mild
atherosclerosis noted.

## 2015-04-30 MED ORDER — IOHEXOL 300 MG/ML  SOLN
125.0000 mL | Freq: Once | INTRAMUSCULAR | Status: AC | PRN
  Administered 2015-04-30: 125 mL via INTRAVENOUS

## 2015-05-06 ENCOUNTER — Other Ambulatory Visit: Payer: BC Managed Care – PPO

## 2015-05-06 DIAGNOSIS — R3129 Other microscopic hematuria: Secondary | ICD-10-CM

## 2015-05-06 LAB — URINALYSIS, COMPLETE
Bilirubin, UA: NEGATIVE
GLUCOSE, UA: NEGATIVE
Ketones, UA: NEGATIVE
Leukocytes, UA: NEGATIVE
NITRITE UA: NEGATIVE
Protein, UA: NEGATIVE
SPEC GRAV UA: 1.015 (ref 1.005–1.030)
UUROB: 0.2 mg/dL (ref 0.2–1.0)
pH, UA: 7 (ref 5.0–7.5)

## 2015-05-06 LAB — MICROSCOPIC EXAMINATION: EPITHELIAL CELLS (NON RENAL): NONE SEEN /HPF (ref 0–10)

## 2015-05-07 ENCOUNTER — Encounter: Payer: Self-pay | Admitting: Family Medicine

## 2015-05-08 ENCOUNTER — Other Ambulatory Visit: Payer: Self-pay

## 2015-05-21 ENCOUNTER — Other Ambulatory Visit: Payer: BC Managed Care – PPO | Admitting: Urology

## 2015-05-23 ENCOUNTER — Ambulatory Visit (INDEPENDENT_AMBULATORY_CARE_PROVIDER_SITE_OTHER): Payer: BLUE CROSS/BLUE SHIELD | Admitting: Urology

## 2015-05-23 ENCOUNTER — Encounter: Payer: Self-pay | Admitting: Urology

## 2015-05-23 VITALS — BP 113/72 | HR 93 | Ht 77.0 in | Wt 322.9 lb

## 2015-05-23 DIAGNOSIS — Q6100 Congenital renal cyst, unspecified: Secondary | ICD-10-CM | POA: Diagnosis not present

## 2015-05-23 DIAGNOSIS — R3129 Other microscopic hematuria: Secondary | ICD-10-CM | POA: Diagnosis not present

## 2015-05-23 DIAGNOSIS — N281 Cyst of kidney, acquired: Secondary | ICD-10-CM

## 2015-05-23 DIAGNOSIS — N529 Male erectile dysfunction, unspecified: Secondary | ICD-10-CM | POA: Diagnosis not present

## 2015-05-23 DIAGNOSIS — N400 Enlarged prostate without lower urinary tract symptoms: Secondary | ICD-10-CM

## 2015-05-23 LAB — URINALYSIS, COMPLETE
BILIRUBIN UA: NEGATIVE
GLUCOSE, UA: NEGATIVE
Ketones, UA: NEGATIVE
Leukocytes, UA: NEGATIVE
NITRITE UA: NEGATIVE
PH UA: 6 (ref 5.0–7.5)
PROTEIN UA: NEGATIVE
Specific Gravity, UA: 1.02 (ref 1.005–1.030)
UUROB: 1 mg/dL (ref 0.2–1.0)

## 2015-05-23 LAB — MICROSCOPIC EXAMINATION
Bacteria, UA: NONE SEEN
WBC UA: NONE SEEN /HPF (ref 0–?)

## 2015-05-23 MED ORDER — CIPROFLOXACIN HCL 500 MG PO TABS
500.0000 mg | ORAL_TABLET | Freq: Once | ORAL | Status: AC
  Administered 2015-05-23: 500 mg via ORAL

## 2015-05-23 MED ORDER — LIDOCAINE HCL 2 % EX GEL
1.0000 "application " | Freq: Once | CUTANEOUS | Status: AC
  Administered 2015-05-23: 1 via URETHRAL

## 2015-05-23 NOTE — Progress Notes (Signed)
3:38 PM  05/23/2015  Isaac Cross [birth year] [id number]  Referring provider: Steele Sizer, MD [address] [place], [place] [address]  Chief Complaint  Patient presents with  . Cysto    HPI: 55 yo M previously patient of Dr. Shary Decamp followed for history of microscopic hematuria, right indeterminate renal mass and BPH.   Microscopic hematuria: Microscopic hematuria x multiple.  CT Urogram 04/2015 shows multiple enlarging renal cyst of benign nature (preivous studies with indeterminate right renal cyst).  Presents for cystoscopy today.  BPH Denies any voiding complaints including urinary urgency, frequency, weak stream, or incomplete bladder emptying.  He gets up only once at night to void and has no complaints. No history of UTIs. No gross hematuria.  He currently takes no medications for his prostate.  He does report a history of remote voiding difficulty/prostatitis and underwent what sounds like TUIBN by Dr. Ernst Spell.    ED: He also has a history of erectile dysfunction. He took Viagra in the remote past but developed her show vision loss in one of his eyes which he relates to this medication. He is not interested in any further treatment for ED.    PMH: Past Medical History  Diagnosis Date  . Diabetes ([place])   . Swelling   . High cholesterol   . Anxiety   . HBP (high blood pressure)   . GERD (gastroesophageal reflux disease)   . Arthritis     knees  . Sleep apnea     CPAP    Surgical History: Past Surgical History  Procedure Laterality Date  . Vein surgery Right   . Bladder repair    . Hand surgery      4TH AND 5TH FINGER  . Knee arthroscopy Right   . Colonoscopy with propofol N/A 04/07/2015    Procedure: COLONOSCOPY WITH PROPOFOL;  Surgeon: Lucilla Lame, MD;  Location: [facility];  Service: Endoscopy;  Laterality: N/A;  Diabetic - oral meds CPAP    Home Medications:    Medication List       This list is accurate as of:  05/23/15  3:38 PM.  Always use your most recent med list.               albuterol 108 (90 Base) MCG/ACT inhaler  Commonly known as:  PROVENTIL HFA;VENTOLIN HFA  Inhale 2 puffs into the lungs 4 (four) times daily.     aspirin 81 MG tablet  Take 81 mg by mouth daily.     atenolol 25 MG tablet  Commonly known as:  TENORMIN  Take 1 tablet (25 mg total) by mouth every evening.     cetirizine 10 MG tablet  Commonly known as:  ZYRTEC  Take 10 mg by mouth daily.     citalopram 20 MG tablet  Commonly known as:  CELEXA  Take 1 tablet (20 mg total) by mouth daily.     diclofenac 50 MG EC tablet  Commonly known as:  VOLTAREN  Take 50 mg by mouth 2 (two) times daily. Reported on 04/23/2015     Fish Oil 1000 MG Caps  Take by mouth daily.     glucose blood test strip  1 strip by Other route 2 (two) times daily.     lisinopril-hydrochlorothiazide 20-12.5 MG tablet  Commonly known as:  PRINZIDE,ZESTORETIC  Take 1 tablet by mouth daily.     metFORMIN 1000 MG tablet  Commonly known as:  GLUCOPHAGE  Take 1 tablet (  1,000 mg total) by mouth 2 (two) times daily with a meal.     rosuvastatin 20 MG tablet  Commonly known as:  CRESTOR  Take 1 tablet (20 mg total) by mouth daily.        Allergies:  Allergies  Allergen Reactions  . Lipitor [Atorvastatin] Hives and Itching  . Viagra  [Sildenafil Citrate]     vision loss    Family History: Family History  Problem Relation Age of Onset  . Hypertension Mother   . Diabetes Mother   . Diabetes Father   . Hypertension Father   . Cancer Father   . Seizures Daughter   . Cancer Maternal Grandfather     Prostate  . Diabetes Paternal Grandfather   . Bladder Cancer Neg Hx   . Prostate cancer Maternal Uncle   . Kidney cancer Neg Hx     Social History:  reports that he quit smoking about 10 years ago. His smoking use included Cigarettes. He started smoking about 20 years ago. He has a 10 pack-year smoking history. He has never used  smokeless tobacco. He reports that he drinks about 6.0 oz of alcohol per week. He reports that he does not use illicit drugs.   Physical Exam: BP 113/72 mmHg  Pulse 93  Ht 6\' 5"  (1.956 m)  Wt 322 lb 14.4 oz (146.466 kg)  BMI 38.28 kg/m2  Constitutional:  Alert and oriented, No acute distress. HEENT: [place] AT, moist mucus membranes.  Trachea midline, no masses. Cardiovascular: No clubbing, cyanosis.  Right greater than left lower extremity edema, right varicose veins noted. Respiratory: Normal respiratory effort, no increased work of breathing. GI: Abdomen is soft, nontender, nondistended, no abdominal masses.   GU: No CVA tenderness.  Normal circumcised phallus. Skin: No rashes, bruises or suspicious lesions. Neurologic: Grossly intact, no focal deficits, moving all 4 extremities. Psychiatric: Normal mood and affect.  Laboratory Data:  Lab Results  Component Value Date   CREATININE 1.2 06/17/2014    Lab Results  Component Value Date   PSA 0.7 01/10/2012   PSA  09/19/2010    ========== TEST NAME ==========  ========= RESULTS =========  = REFERENCE RANGE =  PROSTATE-SPECIFIC AG.  PSA, Serum (Serial Monitor) Prostate Specific Ag, Serum     [   0.6 ng/mL            ]        Not Estab. Roche ECLIA methodology.                                                                      . According to the American Urological Association, Serum PSA should decrease and remain at undetectable levels after radical prostatectomy. The AUA defines biochemical recurrence as an initial PSA value 0.2 ng/mL or greater followed by a subsequent confirmatory PSA value 0.2 ng/mL or greater. Values obtained with different assay methods or kits cannot be used interchangeably. Results cannot be interpreted as absolute evidence of the presence or absence of malignant disease.               [place]            No: [id number]  [address] [place], [place] [address]            Lindon Romp, MD         [phone]   Result(s) reported on 21 Sep 2010 at 11:19PM.      Lab Results  Component Value Date   HGBA1C 5.9 02/17/2015    Urinalysis UA reviewed, see EPIC.  Pertinent Imaging: CLINICAL DATA: Recurrent painless microscopic hematuria.  EXAM: CT ABDOMEN AND PELVIS WITHOUT AND WITH CONTRAST  TECHNIQUE: Multidetector CT imaging of the abdomen and pelvis was performed following the standard protocol before and following the bolus administration of intravenous contrast.  CONTRAST: 169mL OMNIPAQUE IOHEXOL 300 MG/ML SOLN  COMPARISON: Prior CTs 05/29/2008 and 06/09/2006.  FINDINGS: Lower chest: Emphysematous changes and mild atelectasis are present at both lung bases. There is no significant pleural or pericardial effusion.  Hepatobiliary: The hepatic density is diffusely decreased consistent with steatosis. Postcontrast, no focal lesions or abnormal enhancement identified. No evidence of gallstones, gallbladder wall thickening or biliary dilatation.  Pancreas: Unremarkable. No pancreatic ductal dilatation or surrounding inflammatory changes.  Spleen: Normal in size without focal abnormality.  Adrenals/Urinary Tract: Both adrenal glands appear normal. Pre-contrast images demonstrate no renal, ureteral or bladder calculi. Post-contrast, both kidneys enhance normally. There is no evidence of enhancing renal mass. There are enlarging right renal cyst. Anteriorly in the interpolar region, there is a 3.3 x 2.5 cm cyst which previously measured 2.3 cm maximally. In the lower pole of the right kidney, there is a 3.5 cm cyst which previously measured 10 mm maximally. There are other smaller low-density right renal lesions which are too small to characterize, but no enhancing or otherwise suspicious findings are demonstrated. The left kidney appears unremarkable. Delayed images result in segmental visualization of the ureters. No  urothelial abnormalities are identified. The bladder appears unremarkable.  Stomach/Bowel: No evidence of bowel wall thickening, distention or surrounding inflammatory change. Diverticular changes are present throughout the distal colon. The appendix appears normal.  Vascular/Lymphatic: There are no enlarged abdominal or pelvic lymph nodes. Mild aortoiliac atherosclerosis.  Reproductive: Unremarkable.  Other: There is stable prominent fat in both inguinal canals and a stable small umbilical hernia containing only fat.  Musculoskeletal: No acute or significant osseous findings. There are mild degenerative changes throughout the spine.  IMPRESSION: 1. No explanation for hematuria. Specifically, no evidence of urinary tract calculus, renal mass or urothelial lesion. 2. There are several enlarging right renal cysts. Some of these are too small to optimally characterize, but no suspicious lesions are demonstrated. 3. Hepatic steatosis, mild distal colonic diverticulosis and mild atherosclerosis noted.   Electronically Signed  By: Richardean Sale M.D.  On: 04/30/2015 09:28  CT reviewed personally today by me.   Cystoscopy Procedure Note  Patient identification was confirmed, informed consent was obtained, and patient was prepped using Betadine solution.  Lidocaine jelly was administered per urethral meatus.    Preoperative abx where received prior to procedure.    Pre-Procedure: - Inspection reveals a normal caliber ureteral meatus.  Procedure: The flexible cystoscope was introduced without difficulty - No urethral strictures/lesions are present. - Enlarged prostate with trilobar coaptation, 5 cm prostatic length - Elevated bladder neck (very mildly) - Bilateral ureteral orifices identified - Bladder mucosa  reveals no ulcers, tumors, or lesions - No bladder stones - No trabeculation  Retroflexion shows small circumcised median lobe with significant  hypervascularity, .    Post-Procedure: - Patient tolerated the procedure well   Assessment & Plan:    1.  Renal cyst, right First identified on CT urogram in 2010, 1 cm right indeterminate renal cyst. Repeat CT Urogram reviewed, RLP cyst enlarging but not suspicious.  No further work up indicated.   - Urinalysis, Complete  2. Microscopic hematuria Cystoscopy today with small well circumscribed hypervascular median lobe, possible cause of microscopic hematuria.  3. BPH (benign prostatic hyperplasia) History of BPH status post TUIBN. No voiding complaints today.  Prostate cancer screening performed by PCP in October 2016, PSA 0.7.  4. Erectile dysfunction, unspecified erectile dysfunction type No treatment desired.    F/u PRN  [name], MD  [facility] [address], [place], [place] [address] [phone]

## 2015-05-25 ENCOUNTER — Encounter: Payer: Self-pay | Admitting: Family Medicine

## 2015-05-25 DIAGNOSIS — R3129 Other microscopic hematuria: Secondary | ICD-10-CM | POA: Insufficient documentation

## 2015-05-25 DIAGNOSIS — N400 Enlarged prostate without lower urinary tract symptoms: Secondary | ICD-10-CM | POA: Insufficient documentation

## 2015-06-04 ENCOUNTER — Encounter: Payer: Self-pay | Admitting: General Surgery

## 2015-06-05 ENCOUNTER — Ambulatory Visit: Payer: Self-pay | Admitting: General Surgery

## 2015-06-17 ENCOUNTER — Ambulatory Visit (INDEPENDENT_AMBULATORY_CARE_PROVIDER_SITE_OTHER): Payer: BLUE CROSS/BLUE SHIELD | Admitting: Family Medicine

## 2015-06-17 ENCOUNTER — Encounter: Payer: Self-pay | Admitting: Family Medicine

## 2015-06-17 VITALS — BP 118/70 | HR 100 | Temp 98.2°F | Resp 16 | Ht 77.0 in | Wt 322.7 lb

## 2015-06-17 DIAGNOSIS — I700 Atherosclerosis of aorta: Secondary | ICD-10-CM | POA: Insufficient documentation

## 2015-06-17 DIAGNOSIS — Q6100 Congenital renal cyst, unspecified: Secondary | ICD-10-CM

## 2015-06-17 DIAGNOSIS — N281 Cyst of kidney, acquired: Secondary | ICD-10-CM

## 2015-06-17 DIAGNOSIS — K760 Fatty (change of) liver, not elsewhere classified: Secondary | ICD-10-CM | POA: Diagnosis not present

## 2015-06-17 DIAGNOSIS — G4733 Obstructive sleep apnea (adult) (pediatric): Secondary | ICD-10-CM

## 2015-06-17 DIAGNOSIS — J45909 Unspecified asthma, uncomplicated: Secondary | ICD-10-CM

## 2015-06-17 DIAGNOSIS — I10 Essential (primary) hypertension: Secondary | ICD-10-CM

## 2015-06-17 DIAGNOSIS — R809 Proteinuria, unspecified: Secondary | ICD-10-CM | POA: Diagnosis not present

## 2015-06-17 DIAGNOSIS — F418 Other specified anxiety disorders: Secondary | ICD-10-CM

## 2015-06-17 DIAGNOSIS — J4520 Mild intermittent asthma, uncomplicated: Secondary | ICD-10-CM

## 2015-06-17 DIAGNOSIS — E1129 Type 2 diabetes mellitus with other diabetic kidney complication: Secondary | ICD-10-CM

## 2015-06-17 DIAGNOSIS — E1142 Type 2 diabetes mellitus with diabetic polyneuropathy: Secondary | ICD-10-CM

## 2015-06-17 DIAGNOSIS — E785 Hyperlipidemia, unspecified: Secondary | ICD-10-CM | POA: Diagnosis not present

## 2015-06-17 DIAGNOSIS — J449 Chronic obstructive pulmonary disease, unspecified: Secondary | ICD-10-CM | POA: Diagnosis not present

## 2015-06-17 MED ORDER — ALBUTEROL SULFATE HFA 108 (90 BASE) MCG/ACT IN AERS: 2.0000 | INHALATION_SPRAY | Freq: Four times a day (QID) | RESPIRATORY_TRACT | Status: DC

## 2015-06-17 MED ORDER — CITALOPRAM HYDROBROMIDE 20 MG PO TABS: 20.0000 mg | ORAL_TABLET | Freq: Every day | ORAL | Status: DC

## 2015-06-17 MED ORDER — ATENOLOL 25 MG PO TABS: 25.0000 mg | ORAL_TABLET | Freq: Every evening | ORAL | Status: DC

## 2015-06-17 MED ORDER — FLUTICASONE FUROATE-VILANTEROL 100-25 MCG/INH IN AEPB: 1.0000 | INHALATION_SPRAY | Freq: Every day | RESPIRATORY_TRACT | Status: DC

## 2015-06-17 MED ORDER — ROSUVASTATIN CALCIUM 20 MG PO TABS: 20.0000 mg | ORAL_TABLET | Freq: Every day | ORAL | Status: DC

## 2015-06-17 MED ORDER — LISINOPRIL 40 MG PO TABS: 40.0000 mg | ORAL_TABLET | Freq: Every day | ORAL | Status: DC

## 2015-06-17 NOTE — Progress Notes (Signed)
Name: Isaac Cross   MRN: [id number]    DOB: 1961-01-08   Date:06/17/2015       Progress Note  Subjective  Chief Complaint  Chief Complaint  Patient presents with  . Medication Refill    4 month F/U  . Diabetes    Does not sugar at home  . Hyperlipidemia  . COPD  . Hypertension  . Depression    Medication improving mood    HPI  DMII: FSBS at home has been below 140's fasting, hgbA1C last times was  at goal at 5.9% Taking Metformin 1000 mg twice daily and denies side effects. 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 He can't take medication for ED secondary to optic neuropathy. He has been doing well, denies hypoglycemic episodes. He is following a diabetic diet  HTN: taking medication, bp is at goal, denies side effects, no hypothension episodes, no palpitation, no chest pain  Depression/Anxiety: taking Citalopram, in remission. Denies any symptoms of depression or anxiety at this time. He does not want to stop medication   GERD: he weaned self off Omeprazole, taking Ranitidine prn and not symptoms of GERD  Hyperlipidemia: last labs done in March 2016 and LDL was 68, taking Crestor and denies myalgia. He switched to tree nuts. Eating fish at least once a week.  COPD /Asthma: He has been off Advair for the past 4 months and not recent SOB or wheezing. He has not used rescue in a long time, he states symptoms usually in the Spring and would like a refill of medication.   OSA: complaint with CPAP, wears all night and feels rested when he wakes up.   History of hematuria: seen by Dr. Karna Dupes, had a repeat CT , he has renal cysts otherwise negative studies. CT showed fatty liver and atherosclerosis of abdominal aorta.   Patient Active Problem List   Diagnosis Date Noted  . BPH (benign prostatic hyperplasia) 05/25/2015  . Microscopic hematuria 05/25/2015  . Special  screening for malignant neoplasms, colon   . Second degree hemorrhoids   . Diverticulosis of large intestine without diverticulitis   . Asthma, mild intermittent, well-controlled 10/16/2014  . Allergic rhinitis 10/13/2014  . ED (erectile dysfunction) of organic origin 07/22/2014  . Gastro-esophageal reflux disease without esophagitis 07/22/2014  . Benign hypertension 07/22/2014  . 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 Adult BMI 30+ 07/22/2014  . Chronic obstructive asthma ([place]) 07/22/2014  . Depression with anxiety 07/22/2014  . Dyslipidemia 07/22/2014  . Genital herpes 07/22/2014  . 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 Localized osteoarthrosis, lower leg 07/22/2014  . Obstructive apnea 05/14/2013  . 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 Blood in the urine 09/20/2006    Past Surgical History  Procedure Laterality Date  . Vein surgery Right   . Bladder repair    . Hand surgery      4TH AND 5TH FINGER  . Knee arthroscopy Right   . Colonoscopy with propofol N/A 04/07/2015    Procedure: COLONOSCOPY WITH PROPOFOL;  Surgeon: Lucilla Lame, MD;  Location: [facility];  Service: Endoscopy;  Laterality: N/A;  Diabetic - oral meds CPAP    Family History  Problem Relation Age of Onset  . Hypertension Mother   . Diabetes Mother   . Diabetes Father   . Hypertension Father   . Cancer Father   .  Seizures Daughter   . Cancer Maternal Grandfather     Prostate  . Diabetes Paternal Grandfather   . Bladder Cancer Neg Hx   . Prostate cancer Maternal Uncle   . Kidney cancer Neg Hx     Social History   Social History  . Marital Status: Married    Spouse Name: N/A  . Number of Children: N/A  . Years of Education: N/A   Occupational History  . Not on file.   Social History Main Topics  . Smoking status: Former Smoker -- 1.00 packs/day for 10 years    Types: Cigarettes    Start date: 04/20/1995    Quit date: 04/19/2005  . Smokeless  tobacco: Never Used  . Alcohol Use: 6.0 oz/week    10 Cans of beer, 0 Standard drinks or equivalent per week     Comment: occasional  . Drug Use: No  . Sexual Activity:    Partners: Female   Other Topics Concern  . Not on file   Social History Narrative     Current outpatient prescriptions:  .  albuterol (PROVENTIL HFA;VENTOLIN HFA) 108 (90 Base) MCG/ACT inhaler, Inhale 2 puffs into the lungs 4 (four) times daily., Disp: 1 Inhaler, Rfl: 0 .  aspirin 81 MG tablet, Take 81 mg by mouth daily., Disp: , Rfl:  .  atenolol (TENORMIN) 25 MG tablet, Take 1 tablet (25 mg total) by mouth every evening., Disp: 90 tablet, Rfl: 1 .  cetirizine (ZYRTEC) 10 MG tablet, Take 10 mg by mouth daily., Disp: , Rfl:  .  citalopram (CELEXA) 20 MG tablet, Take 1 tablet (20 mg total) by mouth daily., Disp: 90 tablet, Rfl: 1 .  diclofenac (VOLTAREN) 50 MG EC tablet, Take 50 mg by mouth 2 (two) times daily. Reported on 04/23/2015, Disp: , Rfl:  .  glucose blood test strip, 1 strip by Other route 2 (two) times daily., Disp: , Rfl:  .  metFORMIN (GLUCOPHAGE) 1000 MG tablet, Take 1 tablet (1,000 mg total) by mouth 2 (two) times daily with a meal., Disp: 180 tablet, Rfl: 1 .  Omega-3 Fatty Acids (FISH OIL) 1000 MG CAPS, Take by mouth daily., Disp: , Rfl:  .  rosuvastatin (CRESTOR) 20 MG tablet, Take 1 tablet (20 mg total) by mouth daily., Disp: 90 tablet, Rfl: 1 .  fluticasone furoate-vilanterol (BREO ELLIPTA) 100-25 MCG/INH AEPB, Inhale 1 puff into the lungs daily., Disp: 60 each, Rfl: 0 .  lisinopril (PRINIVIL,ZESTRIL) 40 MG tablet, Take 1 tablet (40 mg total) by mouth daily., Disp: 90 tablet, Rfl: 1  Allergies  Allergen Reactions  . Lipitor [Atorvastatin] Hives and Itching  . Viagra  [Sildenafil Citrate]     vision loss     ROS  Constitutional: Negative for fever or weight change.  Respiratory: Negative for cough and shortness of breath.   Cardiovascular: Negative for chest pain or palpitations.   Gastrointestinal: Negative for abdominal pain, no bowel changes.  Musculoskeletal: Negative for gait problem or joint swelling.  Skin: Negative for rash.  Neurological: Negative for dizziness or headache.  No other specific complaints in a complete review of systems (except as listed in HPI above).  Objective  Filed Vitals:   06/17/15 0853  BP: 118/70  Pulse: 100  Temp: 98.2 F (36.8 C)  TempSrc: Oral  Resp: 16  Height: 6\' 5"  (1.956 m)  Weight: 322 lb 11.2 oz (146.376 kg)  SpO2: 98%    Body mass index is 38.26 kg/(m^2).  Physical Exam  Constitutional:  Patient appears well-developed and well-nourished. Obese  No distress.  HEENT: head atraumatic, normocephalic, pupils equal and reactive to light, neck supple, throat within normal limits Cardiovascular: Normal rate, regular rhythm and normal heart sounds.  No murmur heard. No BLE edema. Pulmonary/Chest: Effort normal and breath sounds normal. No respiratory distress. Abdominal: Soft.  There is no tenderness. Psychiatric: Patient has a normal mood and affect. behavior is normal. Judgment and thought content normal.  Recent Results (from the past 2160 hour(s))  Glucose, capillary     Status: Abnormal   Collection Time: 04/07/15  7:02 AM  Result Value Ref Range   Glucose-Capillary 104 (H) 65 - 99 mg/dL  Glucose, capillary     Status: Abnormal   Collection Time: 04/07/15  8:18 AM  Result Value Ref Range   Glucose-Capillary 105 (H) 65 - 99 mg/dL  Urinalysis, Complete     Status: Abnormal   Collection Time: 04/23/15  8:37 AM  Result Value Ref Range   Specific Gravity, UA 1.015 1.005 - 1.030   pH, UA 7.0 5.0 - 7.5   Color, UA Yellow Yellow   Appearance Ur Clear Clear   Leukocytes, UA Negative Negative   Protein, UA 1+ (A) Negative/Trace   Glucose, UA Negative Negative   Ketones, UA Negative Negative   RBC, UA 2+ (A) Negative   Bilirubin, UA Negative Negative   Urobilinogen, Ur 1.0 0.2 - 1.0 mg/dL   Nitrite, UA Negative  Negative   Microscopic Examination See below:   Microscopic Examination     Status: Abnormal   Collection Time: 04/23/15  8:37 AM  Result Value Ref Range   WBC, UA 0-5 0 -  5 /hpf   RBC, UA 3-10 (A) 0 -  2 /hpf   Epithelial Cells (non renal) None seen 0 - 10 /hpf   Mucus, UA Present (A) Not Estab.   Bacteria, UA Few (A) None seen/Few  Urinalysis, Complete     Status: Abnormal   Collection Time: 05/06/15  8:34 AM  Result Value Ref Range   Specific Gravity, UA 1.015 1.005 - 1.030   pH, UA 7.0 5.0 - 7.5   Color, UA Yellow Yellow   Appearance Ur Clear Clear   Leukocytes, UA Negative Negative   Protein, UA Negative Negative/Trace   Glucose, UA Negative Negative   Ketones, UA Negative Negative   RBC, UA 1+ (A) Negative   Bilirubin, UA Negative Negative   Urobilinogen, Ur 0.2 0.2 - 1.0 mg/dL   Nitrite, UA Negative Negative   Microscopic Examination See below:   Microscopic Examination     Status: Abnormal   Collection Time: 05/06/15  8:34 AM  Result Value Ref Range   WBC, UA 0-5 0 -  5 /hpf   RBC, UA 3-10 (A) 0 -  2 /hpf   Epithelial Cells (non renal) None seen 0 - 10 /hpf   Bacteria, UA Few (A) None seen/Few  Urinalysis, Complete     Status: Abnormal   Collection Time: 05/23/15  3:19 PM  Result Value Ref Range   Specific Gravity, UA 1.020 1.005 - 1.030   pH, UA 6.0 5.0 - 7.5   Color, UA Yellow Yellow   Appearance Ur Clear Clear   Leukocytes, UA Negative Negative   Protein, UA Negative Negative/Trace   Glucose, UA Negative Negative   Ketones, UA Negative Negative   RBC, UA 2+ (A) Negative   Bilirubin, UA Negative Negative   Urobilinogen, Ur 1.0 0.2 - 1.0 mg/dL  Nitrite, UA Negative Negative   Microscopic Examination See below:   Microscopic Examination     Status: Abnormal   Collection Time: 05/23/15  3:19 PM  Result Value Ref Range   WBC, UA None seen 0 -  5 /hpf   RBC, UA 11-30 (A) 0 -  2 /hpf   Epithelial Cells (non renal) 0-10 0 - 10 /hpf   Bacteria, UA None seen  None seen/Few     PHQ2/9: Depression screen [facility] 2/9 06/17/2015 01/01/2015 10/16/2014  Decreased Interest 0 0 0  Down, Depressed, Hopeless 0 0 0  PHQ - 2 Score 0 0 0     Fall Risk: Fall Risk  06/17/2015 01/01/2015 10/16/2014  Falls in the past year? No No No     Functional Status Survey: Is the patient deaf or have difficulty hearing?: No Does the patient have difficulty seeing, even when wearing glasses/contacts?: No Does the patient have difficulty concentrating, remembering, or making decisions?: No Does the patient have difficulty walking or climbing stairs?: No Does the patient have difficulty dressing or bathing?: No Does the patient have difficulty doing errands alone such as visiting a doctor's office or shopping?: No    Assessment & Plan  1. Type 2 diabetes mellitus with microalbuminuria, without long-term current use of insulin (HCC)  - POCT HgB A1C - lisinopril (PRINIVIL,ZESTRIL) 40 MG tablet; Take 1 tablet (40 mg total) by mouth daily.  Dispense: 90 tablet; Refill: 1 - Hemoglobin A1c  2. Obstructive apnea  Continue nightly CPAP machine use  3. Benign hypertension  - lisinopril (PRINIVIL,ZESTRIL) 40 MG tablet; Take 1 tablet (40 mg total) by mouth daily.  Dispense: 90 tablet; Refill: 1 - atenolol (TENORMIN) 25 MG tablet; Take 1 tablet (25 mg total) by mouth every evening.  Dispense: 90 tablet; Refill: 1 - Comprehensive metabolic panel  4. Depression with anxiety  - citalopram (CELEXA) 20 MG tablet; Take 1 tablet (20 mg total) by mouth daily.  Dispense: 90 tablet; Refill: 1  5. Dyslipidemia  - rosuvastatin (CRESTOR) 20 MG tablet; Take 1 tablet (20 mg total) by mouth daily.  Dispense: 90 tablet; Refill: 1 - Lipid panel  6. COPD with asthma ([place])  - albuterol (PROVENTIL HFA;VENTOLIN HFA) 108 (90 Base) MCG/ACT inhaler; Inhale 2 puffs into the lungs 4 (four) times daily.  Dispense: 1 Inhaler; Refill: 0 - fluticasone furoate-vilanterol (BREO ELLIPTA) 100-25  MCG/INH AEPB; Inhale 1 puff into the lungs daily.  Dispense: 60 each; Refill: 0

## 2015-06-18 LAB — COMPREHENSIVE METABOLIC PANEL
A/G RATIO: 1.7 (ref 1.1–2.5)
ALBUMIN: 4.7 g/dL (ref 3.5–5.5)
ALT: 26 IU/L (ref 0–44)
AST: 28 IU/L (ref 0–40)
Alkaline Phosphatase: 36 IU/L — ABNORMAL LOW (ref 39–117)
BUN / CREAT RATIO: 10 (ref 9–20)
BUN: 12 mg/dL (ref 6–24)
Bilirubin Total: 0.5 mg/dL (ref 0.0–1.2)
CALCIUM: 10.3 mg/dL — AB (ref 8.7–10.2)
CO2: 25 mmol/L (ref 18–29)
Chloride: 95 mmol/L — ABNORMAL LOW (ref 96–106)
Creatinine, Ser: 1.16 mg/dL (ref 0.76–1.27)
GFR calc Af Amer: 81 mL/min/{1.73_m2} (ref >59)
GFR, EST NON AFRICAN AMERICAN: 70 mL/min/{1.73_m2} (ref >59)
GLOBULIN, TOTAL: 2.8 g/dL (ref 1.5–4.5)
Glucose: 125 mg/dL — ABNORMAL HIGH (ref 65–99)
POTASSIUM: 4.6 mmol/L (ref 3.5–5.2)
SODIUM: 137 mmol/L (ref 134–144)
Total Protein: 7.5 g/dL (ref 6.0–8.5)

## 2015-06-18 LAB — LIPID PANEL
CHOL/HDL RATIO: 3.2 ratio (ref 0.0–5.0)
Cholesterol, Total: 127 mg/dL (ref 100–199)
HDL: 40 mg/dL (ref >39)
LDL Calculated: 48 mg/dL (ref 0–99)
TRIGLYCERIDES: 194 mg/dL — AB (ref 0–149)
VLDL Cholesterol Cal: 39 mg/dL (ref 5–40)

## 2015-06-18 LAB — HEMOGLOBIN A1C
Est. average glucose Bld gHb Est-mCnc: 148 mg/dL
HEMOGLOBIN A1C: 6.8 % — AB (ref 4.8–5.6)

## 2015-08-12 ENCOUNTER — Other Ambulatory Visit: Payer: Self-pay | Admitting: Family Medicine

## 2015-09-01 LAB — HM DIABETES EYE EXAM

## 2015-10-23 ENCOUNTER — Ambulatory Visit (INDEPENDENT_AMBULATORY_CARE_PROVIDER_SITE_OTHER): Payer: BC Managed Care – PPO | Admitting: Family Medicine

## 2015-10-23 ENCOUNTER — Encounter: Payer: Self-pay | Admitting: Family Medicine

## 2015-10-23 VITALS — BP 118/64 | HR 97 | Temp 99.0°F | Resp 16 | Wt 324.4 lb

## 2015-10-23 DIAGNOSIS — R809 Proteinuria, unspecified: Secondary | ICD-10-CM | POA: Diagnosis not present

## 2015-10-23 DIAGNOSIS — G4733 Obstructive sleep apnea (adult) (pediatric): Secondary | ICD-10-CM | POA: Diagnosis not present

## 2015-10-23 DIAGNOSIS — M170 Bilateral primary osteoarthritis of knee: Secondary | ICD-10-CM | POA: Diagnosis not present

## 2015-10-23 DIAGNOSIS — E1129 Type 2 diabetes mellitus with other diabetic kidney complication: Secondary | ICD-10-CM

## 2015-10-23 DIAGNOSIS — J45909 Unspecified asthma, uncomplicated: Secondary | ICD-10-CM

## 2015-10-23 DIAGNOSIS — J449 Chronic obstructive pulmonary disease, unspecified: Secondary | ICD-10-CM

## 2015-10-23 DIAGNOSIS — I700 Atherosclerosis of aorta: Secondary | ICD-10-CM | POA: Diagnosis not present

## 2015-10-23 DIAGNOSIS — E785 Hyperlipidemia, unspecified: Secondary | ICD-10-CM | POA: Diagnosis not present

## 2015-10-23 DIAGNOSIS — F418 Other specified anxiety disorders: Secondary | ICD-10-CM | POA: Diagnosis not present

## 2015-10-23 DIAGNOSIS — E1142 Type 2 diabetes mellitus with diabetic polyneuropathy: Secondary | ICD-10-CM | POA: Diagnosis not present

## 2015-10-23 DIAGNOSIS — I10 Essential (primary) hypertension: Secondary | ICD-10-CM

## 2015-10-23 LAB — POCT UA - MICROALBUMIN: Microalbumin Ur, POC: 100 mg/L

## 2015-10-23 LAB — POCT GLYCOSYLATED HEMOGLOBIN (HGB A1C): Hemoglobin A1C: 6.5

## 2015-10-23 MED ORDER — LISINOPRIL 40 MG PO TABS: 40.0000 mg | ORAL_TABLET | Freq: Every day | ORAL | Status: DC

## 2015-10-23 MED ORDER — ACETAMINOPHEN 500 MG PO TABS: 500.0000 mg | ORAL_TABLET | Freq: Three times a day (TID) | ORAL | Status: DC | PRN

## 2015-10-23 MED ORDER — ROSUVASTATIN CALCIUM 20 MG PO TABS: 20.0000 mg | ORAL_TABLET | Freq: Every day | ORAL | Status: DC

## 2015-10-23 MED ORDER — CITALOPRAM HYDROBROMIDE 20 MG PO TABS: 20.0000 mg | ORAL_TABLET | Freq: Every day | ORAL | Status: DC

## 2015-10-23 MED ORDER — FLUTICASONE FUROATE-VILANTEROL 100-25 MCG/INH IN AEPB: 1.0000 | INHALATION_SPRAY | Freq: Every day | RESPIRATORY_TRACT | Status: DC

## 2015-10-23 MED ORDER — DICLOFENAC SODIUM 1 % TD GEL: 4.0000 g | Freq: Four times a day (QID) | TRANSDERMAL | Status: DC

## 2015-10-23 MED ORDER — ATENOLOL 25 MG PO TABS: 25.0000 mg | ORAL_TABLET | Freq: Every evening | ORAL | Status: DC

## 2015-10-23 NOTE — Progress Notes (Signed)
Name: Isaac Cross   MRN: [id number]    DOB: 07/15/1960   Date:10/23/2015       Progress Note  Subjective  Chief Complaint  Chief Complaint  Patient presents with  . Medication Refill  . Diabetes    patient is here for his 51-month f/u. patient stated he has not been checking it b/c he has to change the batteries, but the last time it was high.  Marland Kitchen Apnea  . Hypertension    patient stated he has had no problems. patient does not check his BP.  Marland Kitchen Depression  . COPD  . Asthma    HPI  DMII: FSBS has not been checked lately. Taking Metformin 1000 mg twice daily and denies side effects. 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 He has been doing well, denies hypoglycemic episodes. He is following a diabetic diet, hgbA1C is at goal   HTN: taking medication, bp is at goal, denies side effects, no dizziness, no palpitation, no chest pain  Depression/Anxiety: taking Citalopram, in remission. Denies any symptoms of depression or anxiety at this time. He does not want to stop medication   GERD: he weaned self off Omeprazole, taking Ranitidine prn and not symptoms of GERD  Hyperlipidemia: last labs done Feb 2017 and LDL was 48, taking Crestor and denies myalgia. He switched to tree nuts. Eating fish at least once or twice  a week.  COPD /Asthma: We changed from Advair to [facility] on his last visit, still taking medication occasionally. He states that gets more symptomatic this time of the year secondary to humidity. . He has not used rescue in a long time. He has occasional morning cough, no wheezing, but has SOB when outside  OSA: complaint with CPAP, wears all night and feels rested when he wakes up. Wakes up feeling refreshed and no morning headache  History of hematuria: seen by Dr. Karna Dupes, had a repeat CT , he has renal cysts  otherwise negative studies. CT showed fatty liver and atherosclerosis of abdominal aorta. He is on high dose statin for atherosclerosis and has been released by Dr. Karna Dupes  OA: he has noticed daily knee pain and popping sensation, no instability, he has been limping secondary to pain. Aching pain, no radiation, 5/10   Patient Active Problem List   Diagnosis Date Noted  . Atherosclerosis of abdominal aorta ([place]) 06/17/2015  . Fatty liver disease, nonalcoholic [id number]  . Renal cyst, right 06/17/2015  . BPH (benign prostatic hyperplasia) 05/25/2015  . Microscopic hematuria 05/25/2015  . Second degree hemorrhoids   . Diverticulosis of large intestine without diverticulitis   . Asthma, mild intermittent, well-controlled 10/16/2014  . Allergic rhinitis 10/13/2014  . ED (erectile dysfunction) of organic origin 07/22/2014  . Gastro-esophageal reflux disease without esophagitis 07/22/2014  . Benign hypertension 07/22/2014  . 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 Adult BMI 30+ 07/22/2014  . Chronic obstructive asthma ([place]) 07/22/2014  . Depression with anxiety 07/22/2014  . Dyslipidemia 07/22/2014  . Genital herpes 07/22/2014  . 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 Osteoarthritis of both knees 07/22/2014  . Obstructive apnea 05/14/2013  . 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 Blood in the urine 09/20/2006    Past Surgical History  Procedure Laterality Date  . Vein surgery Right   . Bladder  repair    . Hand surgery      4TH AND 5TH FINGER  . Knee arthroscopy Right   . Colonoscopy with propofol N/A 04/07/2015    Procedure: COLONOSCOPY WITH PROPOFOL;  Surgeon: Lucilla Lame, MD;  Location: [facility];  Service: Endoscopy;  Laterality: N/A;  Diabetic - oral meds CPAP    Family History  Problem Relation Age of Onset  . Hypertension Mother   . Diabetes Mother   . Diabetes Father   . Hypertension Father   . Cancer Father   . Seizures  Daughter   . Cancer Maternal Grandfather     Prostate  . Diabetes Paternal Grandfather   . Bladder Cancer Neg Hx   . Prostate cancer Maternal Uncle   . Kidney cancer Neg Hx     Social History   Social History  . Marital Status: Married    Spouse Name: N/A  . Number of Children: N/A  . Years of Education: N/A   Occupational History  . Not on file.   Social History Main Topics  . Smoking status: Former Smoker -- 1.00 packs/day for 10 years    Types: Cigarettes    Start date: 04/20/1995    Quit date: 04/19/2005  . Smokeless tobacco: Never Used  . Alcohol Use: 6.0 oz/week    10 Cans of beer, 0 Standard drinks or equivalent per week     Comment: occasional  . Drug Use: No  . Sexual Activity:    Partners: Female   Other Topics Concern  . Not on file   Social History Narrative     Current outpatient prescriptions:  .  albuterol (PROVENTIL HFA;VENTOLIN HFA) 108 (90 Base) MCG/ACT inhaler, Inhale 2 puffs into the lungs 4 (four) times daily., Disp: 1 Inhaler, Rfl: 0 .  aspirin 81 MG tablet, Take 81 mg by mouth daily., Disp: , Rfl:  .  atenolol (TENORMIN) 25 MG tablet, Take 1 tablet (25 mg total) by mouth every evening., Disp: 90 tablet, Rfl: 1 .  cetirizine (ZYRTEC) 10 MG tablet, Take 10 mg by mouth daily., Disp: , Rfl:  .  citalopram (CELEXA) 20 MG tablet, Take 1 tablet (20 mg total) by mouth daily., Disp: 90 tablet, Rfl: 1 .  fluticasone furoate-vilanterol (BREO ELLIPTA) 100-25 MCG/INH AEPB, Inhale 1 puff into the lungs daily., Disp: 60 each, Rfl: 2 .  glucose blood test strip, 1 strip by Other route 2 (two) times daily., Disp: , Rfl:  .  lisinopril (PRINIVIL,ZESTRIL) 40 MG tablet, Take 1 tablet (40 mg total) by mouth daily., Disp: 90 tablet, Rfl: 1 .  metFORMIN (GLUCOPHAGE) 1000 MG tablet, TAKE ONE TABLET BY MOUTH TWICE DAILY WITH MEALS, Disp: 180 tablet, Rfl: 1 .  Multiple Vitamins-Minerals (MENS MULTIVITAMIN PLUS) TABS, Take by mouth., Disp: , Rfl:  .  Omega-3 Fatty Acids  (FISH OIL) 1000 MG CAPS, Take by mouth daily., Disp: , Rfl:  .  rosuvastatin (CRESTOR) 20 MG tablet, Take 1 tablet (20 mg total) by mouth daily., Disp: 90 tablet, Rfl: 1 .  acetaminophen (TYLENOL) 500 MG tablet, Take 1-2 tablets (500-1,000 mg total) by mouth every 8 (eight) hours as needed., Disp: 90 tablet, Rfl: 0 .  diclofenac sodium (VOLTAREN) 1 % GEL, Apply 4 g topically 4 (four) times daily., Disp: 100 g, Rfl: 0  Allergies  Allergen Reactions  . Lipitor [Atorvastatin] Hives and Itching  . Viagra  [Sildenafil Citrate]     vision loss     ROS  Constitutional: Negative  for fever or weight change.  Respiratory: Positive  for cough and shortness of breath.   Cardiovascular: Negative for chest pain or palpitations.  Gastrointestinal: Negative for abdominal pain, no bowel changes.  Musculoskeletal: Negative for gait problem or joint swelling.  Skin: Negative for rash.  Neurological: Negative for dizziness or headache.  No other specific complaints in a complete review of systems (except as listed in HPI above).  Objective  Filed Vitals:   10/23/15 0926  BP: 118/64  Pulse: 97  Temp: 99 F (37.2 C)  TempSrc: Oral  Resp: 16  Weight: 324 lb 6.4 oz (147.147 kg)  SpO2: 97%    Body mass index is 38.46 kg/(m^2).  Physical Exam  Constitutional: Patient appears well-developed and well-nourished. Obese No distress.  HEENT: head atraumatic, normocephalic, pupils equal and reactive to light,  neck supple, throat within normal limits Cardiovascular: Normal rate, regular rhythm and normal heart sounds.  No murmur heard. No BLE edema. Pulmonary/Chest: Effort normal and breath sounds normal. No respiratory distress. Abdominal: Soft.  There is no tenderness. Psychiatric: Patient has a normal mood and affect. behavior is normal. Judgment and thought content normal. Muscular Skeletal: mild effusion right knee, crepitus with extension of both knee  Recent Results (from the past 2160  hour(s))  POCT glycosylated hemoglobin (Hb A1C)     Status: Abnormal   Collection Time: 10/23/15  9:34 AM  Result Value Ref Range   Hemoglobin A1C 6.5   POCT UA - Microalbumin     Status: Abnormal   Collection Time: 10/23/15  9:34 AM  Result Value Ref Range   Microalbumin Ur, POC 100 mg/L   Creatinine, POC  mg/dL   Albumin/Creatinine Ratio, Urine, POC      Diabetic Foot Exam: Diabetic Foot Exam - Simple   Simple Foot Form  Diabetic Foot exam was performed with the following findings:  Yes 10/23/2015 10:10 AM  Visual Inspection  See comments:  Yes  Sensation Testing  Intact to touch and monofilament testing bilaterally:  Yes  Pulse Check  Posterior Tibialis and Dorsalis pulse intact bilaterally:  Yes  Comments  Nail deformities, dry and brittle, dry feet       PHQ2/9: Depression screen [facility] 2/9 10/23/2015 06/17/2015 01/01/2015 10/16/2014  Decreased Interest 0 0 0 0  Down, Depressed, Hopeless 0 0 0 0  PHQ - 2 Score 0 0 0 0     Fall Risk: Fall Risk  10/23/2015 06/17/2015 01/01/2015 10/16/2014  Falls in the past year? No No No No     Functional Status Survey: Is the patient deaf or have difficulty hearing?: No Does the patient have difficulty seeing, even when wearing glasses/contacts?: No Does the patient have difficulty concentrating, remembering, or making decisions?: No Does the patient have difficulty walking or climbing stairs?: No Does the patient have difficulty dressing or bathing?: No Does the patient have difficulty doing errands alone such as visiting a doctor's office or shopping?: No    Assessment & Plan  1. Type 2 diabetes mellitus with microalbuminuria, without long-term current use of insulin (HCC)  - POCT glycosylated hemoglobin (Hb A1C) - POCT UA - Microalbumin - lisinopril (PRINIVIL,ZESTRIL) 40 MG tablet; Take 1 tablet (40 mg total) by mouth daily.  Dispense: 90 tablet; Refill: 1  2. Obstructive apnea  Continue CPAP every night  3. Benign  hypertension  - lisinopril (PRINIVIL,ZESTRIL) 40 MG tablet; Take 1 tablet (40 mg total) by mouth daily.  Dispense: 90 tablet; Refill: 1 - atenolol (TENORMIN) 25  MG tablet; Take 1 tablet (25 mg total) by mouth every evening.  Dispense: 90 tablet; Refill: 1  4. Depression with anxiety  - citalopram (CELEXA) 20 MG tablet; Take 1 tablet (20 mg total) by mouth daily.  Dispense: 90 tablet; Refill: 1  5. Dyslipidemia  - rosuvastatin (CRESTOR) 20 MG tablet; Take 1 tablet (20 mg total) by mouth daily.  Dispense: 90 tablet; Refill: 1  6. Atherosclerosis of abdominal aorta (HCC)  Continue statin therapy   7. Diabetic polyneuropathy associated with type 2 diabetes mellitus ([place])  Continue current regiment  8. COPD with asthma ([place])  - fluticasone furoate-vilanterol (BREO ELLIPTA) 100-25 MCG/INH AEPB; Inhale 1 puff into the lungs daily.  Dispense: 60 each; Refill: 2  9. Primary osteoarthritis of both knees  Discussed referral to Ortho but he would like to hold off for now, discussed home exercises also  - diclofenac sodium (VOLTAREN) 1 % GEL; Apply 4 g topically 4 (four) times daily.  Dispense: 100 g; Refill: 0 - acetaminophen (TYLENOL) 500 MG tablet; Take 1-2 tablets (500-1,000 mg total) by mouth every 8 (eight) hours as needed.  Dispense: 90 tablet; Refill: 0

## 2015-11-17 ENCOUNTER — Encounter: Payer: Self-pay | Admitting: Family Medicine

## 2016-01-26 ENCOUNTER — Encounter: Payer: Self-pay | Admitting: Family Medicine

## 2016-01-26 ENCOUNTER — Ambulatory Visit (INDEPENDENT_AMBULATORY_CARE_PROVIDER_SITE_OTHER): Payer: BC Managed Care – PPO | Admitting: Family Medicine

## 2016-01-26 VITALS — BP 120/64 | HR 100 | Temp 98.0°F | Resp 16 | Ht 77.0 in | Wt 319.0 lb

## 2016-01-26 DIAGNOSIS — F418 Other specified anxiety disorders: Secondary | ICD-10-CM

## 2016-01-26 DIAGNOSIS — M170 Bilateral primary osteoarthritis of knee: Secondary | ICD-10-CM | POA: Diagnosis not present

## 2016-01-26 DIAGNOSIS — J449 Chronic obstructive pulmonary disease, unspecified: Secondary | ICD-10-CM | POA: Diagnosis not present

## 2016-01-26 DIAGNOSIS — G4733 Obstructive sleep apnea (adult) (pediatric): Secondary | ICD-10-CM

## 2016-01-26 DIAGNOSIS — Z23 Encounter for immunization: Secondary | ICD-10-CM

## 2016-01-26 DIAGNOSIS — I10 Essential (primary) hypertension: Secondary | ICD-10-CM

## 2016-01-26 DIAGNOSIS — R809 Proteinuria, unspecified: Secondary | ICD-10-CM | POA: Diagnosis not present

## 2016-01-26 DIAGNOSIS — E785 Hyperlipidemia, unspecified: Secondary | ICD-10-CM | POA: Diagnosis not present

## 2016-01-26 DIAGNOSIS — E1142 Type 2 diabetes mellitus with diabetic polyneuropathy: Secondary | ICD-10-CM | POA: Diagnosis not present

## 2016-01-26 DIAGNOSIS — E1129 Type 2 diabetes mellitus with other diabetic kidney complication: Secondary | ICD-10-CM | POA: Diagnosis not present

## 2016-01-26 DIAGNOSIS — N529 Male erectile dysfunction, unspecified: Secondary | ICD-10-CM

## 2016-01-26 DIAGNOSIS — I700 Atherosclerosis of aorta: Secondary | ICD-10-CM

## 2016-01-26 DIAGNOSIS — J4489 Other specified chronic obstructive pulmonary disease: Secondary | ICD-10-CM

## 2016-01-26 LAB — COMPLETE METABOLIC PANEL WITH GFR
ALT: 29 U/L (ref 9–46)
AST: 32 U/L (ref 10–35)
Albumin: 4.3 g/dL (ref 3.6–5.1)
Alkaline Phosphatase: 34 U/L — ABNORMAL LOW (ref 40–115)
BUN: 9 mg/dL (ref 7–25)
CHLORIDE: 104 mmol/L (ref 98–110)
CO2: 24 mmol/L (ref 20–31)
CREATININE: 1.06 mg/dL (ref 0.70–1.33)
Calcium: 9.7 mg/dL (ref 8.6–10.3)
GFR, Est African American: >89 mL/min (ref >=60)
GFR, Est Non African American: 78 mL/min (ref >=60)
GLUCOSE: 105 mg/dL — AB (ref 65–99)
Potassium: 4.6 mmol/L (ref 3.5–5.3)
Sodium: 140 mmol/L (ref 135–146)
Total Bilirubin: 0.5 mg/dL (ref 0.2–1.2)
Total Protein: 7.2 g/dL (ref 6.1–8.1)

## 2016-01-26 LAB — LIPID PANEL
Cholesterol: 124 mg/dL — ABNORMAL LOW (ref 125–200)
HDL: 55 mg/dL (ref >=40)
LDL CALC: 56 mg/dL (ref <130)
Total CHOL/HDL Ratio: 2.3 Ratio (ref <=5.0)
Triglycerides: 64 mg/dL (ref <150)
VLDL: 13 mg/dL (ref <30)

## 2016-01-26 LAB — POCT GLYCOSYLATED HEMOGLOBIN (HGB A1C): HEMOGLOBIN A1C: 6.2

## 2016-01-26 MED ORDER — METFORMIN HCL 1000 MG PO TABS: 1000.0000 mg | ORAL_TABLET | Freq: Two times a day (BID) | ORAL | 1 refills | Status: DC

## 2016-01-26 MED ORDER — FLUTICASONE FUROATE-VILANTEROL 100-25 MCG/INH IN AEPB: 1.0000 | INHALATION_SPRAY | Freq: Every day | RESPIRATORY_TRACT | 5 refills | Status: DC

## 2016-01-26 MED ORDER — ALBUTEROL SULFATE HFA 108 (90 BASE) MCG/ACT IN AERS: 2.0000 | INHALATION_SPRAY | Freq: Four times a day (QID) | RESPIRATORY_TRACT | 0 refills | Status: DC

## 2016-01-26 NOTE — Progress Notes (Addendum)
Name: Isaac Cross   MRN: [id number]    DOB: 05/01/1960   Date:01/26/2016       Progress Note  Subjective  Chief Complaint  Chief Complaint  Patient presents with  . Diabetes    3 month follow up pt not checking BS on a regular basis  . Hyperlipidemia  . Flu Vaccine    HPI  DMII: FSBS has not been checked lately. Taking Metformin 1000 mg twice daily and denies side effects. 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 He has been doing well, denies hypoglycemic episodes. He is following a diabetic diet, hgbA1C is at goal down to 6.2%. Lost weight, he states he eats breakfast and also a larger lunch but skipping dinner. Discussed eating salad or vegetables at night  HTN: taking medication, bp is at goal, denies side effects,  no palpitation, no chest pain, he states occasionally he gets dizzy when he sitting for a long time and stands up ( like when travelling )  Depression/Anxiety: taking Citalopram, in remission. Denies any symptoms of depression or anxiety at this time. He does not want to stop medication   Hyperlipidemia: last labs done Feb 2017 and LDL was 48, taking Crestor and denies myalgia. He switched to tree nuts. Eating fish at least once or twice  a week. HDL was 40.   COPD /Asthma: We changed from Advair to [facility] 2017, he uses Breo once or twice weekly when he wakes up feeling tight. He states that gets more symptomatic this time of the year secondary to humidity. He has not used rescue in a long time. He has occasional morning cough, no wheezing, denies current SOB  OSA: complaint with CPAP, wears all night.  Wakes up feeling refreshed and no morning headache  History of hematuria: seen by Dr. Karna Dupes, had a repeat CT , he has renal cysts otherwise negative studies. CT showed fatty liver and atherosclerosis  of abdominal aorta. He is on high dose statin for atherosclerosis and has been released by Dr. Karna Dupes  OA: he has noticed daily knee pain and popping sensation, no instability, he occasionally limps because of pain.  Aching pain, no radiation, right now is 3/10. Triggered by activity and better with rest and Tylenol    Patient Active Problem List   Diagnosis Date Noted  . Atherosclerosis of abdominal aorta ([place]) 06/17/2015  . Fatty liver disease, nonalcoholic 41/74/0814  . Renal cyst, right 06/17/2015  . BPH (benign prostatic hyperplasia) 05/25/2015  . Microscopic hematuria 05/25/2015  . Second degree hemorrhoids   . Diverticulosis of large intestine without diverticulitis   . Asthma, mild intermittent, well-controlled 10/16/2014  . Allergic rhinitis 10/13/2014  . ED (erectile dysfunction) of organic origin 07/22/2014  . Gastro-esophageal reflux disease without esophagitis 07/22/2014  . Benign hypertension 07/22/2014  . 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 Adult BMI 30+ 07/22/2014  . Chronic obstructive asthma ([place]) 07/22/2014  . Depression with anxiety 07/22/2014  . Dyslipidemia 07/22/2014  . Genital herpes 07/22/2014  . 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 Osteoarthritis of both knees 07/22/2014  . Obstructive apnea 05/14/2013  . 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 Blood in the urine 09/20/2006    Past Surgical History:  Procedure Laterality Date  . BLADDER REPAIR    . COLONOSCOPY WITH PROPOFOL  N/A 04/07/2015   Procedure: COLONOSCOPY WITH PROPOFOL;  Surgeon: Lucilla Lame, MD;  Location: [facility];  Service: Endoscopy;  Laterality: N/A;  Diabetic - oral meds CPAP  . HAND SURGERY     4TH AND 5TH FINGER  . KNEE ARTHROSCOPY Right   . VEIN SURGERY Right     Family History  Problem Relation Age of Onset  . Hypertension Mother   . Diabetes Mother   . Diabetes Father   . Hypertension Father   . Cancer Father   . Seizures  Daughter   . Cancer Maternal Grandfather     Prostate  . Diabetes Paternal Grandfather   . Bladder Cancer Neg Hx   . Prostate cancer Maternal Uncle   . Kidney cancer Neg Hx     Social History   Social History  . Marital status: Married    Spouse name: N/A  . Number of children: N/A  . Years of education: N/A   Occupational History  . Not on file.   Social History Main Topics  . Smoking status: Former Smoker    Packs/day: 1.00    Years: 10.00    Types: Cigarettes    Start date: 04/20/1995    Quit date: 04/19/2005  . Smokeless tobacco: Never Used  . Alcohol use 6.0 oz/week    10 Cans of beer per week     Comment: occasional  . Drug use: No  . Sexual activity: Yes    Partners: Female   Other Topics Concern  . Not on file   Social History Narrative  . No narrative on file     Current Outpatient Prescriptions:  .  acetaminophen (TYLENOL) 500 MG tablet, Take 1-2 tablets (500-1,000 mg total) by mouth every 8 (eight) hours as needed., Disp: 90 tablet, Rfl: 0 .  albuterol (PROVENTIL HFA;VENTOLIN HFA) 108 (90 Base) MCG/ACT inhaler, Inhale 2 puffs into the lungs 4 (four) times daily., Disp: 1 Inhaler, Rfl: 0 .  aspirin 81 MG tablet, Take 81 mg by mouth daily., Disp: , Rfl:  .  atenolol (TENORMIN) 25 MG tablet, Take 1 tablet (25 mg total) by mouth every evening., Disp: 90 tablet, Rfl: 1 .  cetirizine (ZYRTEC) 10 MG tablet, Take 10 mg by mouth daily., Disp: , Rfl:  .  citalopram (CELEXA) 20 MG tablet, Take 1 tablet (20 mg total) by mouth daily., Disp: 90 tablet, Rfl: 1 .  diclofenac sodium (VOLTAREN) 1 % GEL, Apply 4 g topically 4 (four) times daily., Disp: 100 g, Rfl: 0 .  fluticasone furoate-vilanterol (BREO ELLIPTA) 100-25 MCG/INH AEPB, Inhale 1 puff into the lungs daily., Disp: 60 each, Rfl: 5 .  glucose blood test strip, 1 strip by Other route 2 (two) times daily., Disp: , Rfl:  .  lisinopril (PRINIVIL,ZESTRIL) 40 MG tablet, Take 1 tablet (40 mg total) by mouth daily., Disp:  90 tablet, Rfl: 1 .  metFORMIN (GLUCOPHAGE) 1000 MG tablet, Take 1 tablet (1,000 mg total) by mouth 2 (two) times daily with a meal., Disp: 180 tablet, Rfl: 1 .  Multiple Vitamins-Minerals (MENS MULTIVITAMIN PLUS) TABS, Take by mouth., Disp: , Rfl:  .  Omega-3 Fatty Acids (FISH OIL) 1000 MG CAPS, Take by mouth daily., Disp: , Rfl:  .  rosuvastatin (CRESTOR) 20 MG tablet, Take 1 tablet (20 mg total) by mouth daily., Disp: 90 tablet, Rfl: 1  Allergies  Allergen Reactions  . Lipitor [Atorvastatin] Hives and Itching  . Viagra  [Sildenafil Citrate]     vision loss  ROS  Constitutional: Negative for fever or weight change.  Respiratory: Negative for cough and shortness of breath.   Cardiovascular: Negative for chest pain or palpitations.  Gastrointestinal: Negative for abdominal pain, no bowel changes.  Musculoskeletal: Negative for gait problem or joint swelling.  Skin: Negative for rash.  Neurological: Negative for dizziness or headache.  No other specific complaints in a complete review of systems (except as listed in HPI above).  Objective  Vitals:   01/26/16 0809  BP: 120/64  Pulse: 100  Resp: 16  Temp: 98 F (36.7 C)  SpO2: 96%  Weight: (!) 319 lb (144.7 kg)  Height: 6\' 5"  (1.956 m)    Body mass index is 37.83 kg/m.  Physical Exam  Constitutional: Patient appears well-developed and well-nourished. Obese  No distress.  HEENT: head atraumatic, normocephalic, pupils equal and reactive to light, neck supple, throat within normal limits Cardiovascular: Normal rate, regular rhythm and normal heart sounds.  No murmur heard. Trace BLE edema, varicose veins. Pulmonary/Chest: Effort normal and breath sounds normal. No respiratory distress. Abdominal: Soft.  There is no tenderness. Psychiatric: Patient has a normal mood and affect. behavior is normal. Judgment and thought content normal.  Recent Results (from the past 2160 hour(s))  POCT HgB A1C     Status: None    Collection Time: 01/26/16  8:26 AM  Result Value Ref Range   Hemoglobin A1C 6.2     PHQ2/9: Depression screen [facility] 2/9 01/26/2016 10/23/2015 06/17/2015 01/01/2015 10/16/2014  Decreased Interest 0 0 0 0 0  Down, Depressed, Hopeless 0 0 0 0 0  PHQ - 2 Score 0 0 0 0 0     Fall Risk: Fall Risk  01/26/2016 10/23/2015 06/17/2015 01/01/2015 10/16/2014  Falls in the past year? No No No No No     Functional Status Survey: Is the patient deaf or have difficulty hearing?: No Does the patient have difficulty seeing, even when wearing glasses/contacts?: No Does the patient have difficulty concentrating, remembering, or making decisions?: No Does the patient have difficulty walking or climbing stairs?: Yes (knees) Does the patient have difficulty dressing or bathing?: No Does the patient have difficulty doing errands alone such as visiting a doctor's office or shopping?: No    Assessment & Plan    1. Type 2 diabetes mellitus with microalbuminuria, without long-term current use of insulin (HCC)  - POCT HgB A1C - metFORMIN (GLUCOPHAGE) 1000 MG tablet; Take 1 tablet (1,000 mg total) by mouth 2 (two) times daily with a meal.  Dispense: 180 tablet; Refill: 1  2. Need for influenza vaccination  - Flu Vaccine QUAD 36+ mos PF IM (Fluarix & Fluzone Quad PF)  3. Benign hypertension  Well controlled  4. Dyslipidemia  -lipid panel   5. Atherosclerosis of abdominal aorta ([place])  Continue medical management  6. COPD with asthma ([place])  Doing well taking medication prn  - albuterol (PROVENTIL HFA;VENTOLIN HFA) 108 (90 Base) MCG/ACT inhaler; Inhale 2 puffs into the lungs 4 (four) times daily.  Dispense: 1 Inhaler; Refill: 0 - fluticasone furoate-vilanterol (BREO ELLIPTA) 100-25 MCG/INH AEPB; Inhale 1 puff into the lungs daily.  Dispense: 60 each; Refill: 5  7. Diabetic polyneuropathy associated with type 2 diabetes mellitus (HCC)  - metFORMIN (GLUCOPHAGE) 1000 MG tablet; Take 1 tablet (1,000 mg  total) by mouth 2 (two) times daily with a meal.  Dispense: 180 tablet; Refill: 1  8. Primary osteoarthritis of both knees  Continue Tylenol and discussed home PT  9. ED (erectile  dysfunction) of organic origin  He states he is okay at this time, can't take ED medication because optic neuropathy - released by ophthalmologist.

## 2016-04-29 ENCOUNTER — Encounter: Payer: Self-pay | Admitting: Family Medicine

## 2016-04-29 ENCOUNTER — Ambulatory Visit (INDEPENDENT_AMBULATORY_CARE_PROVIDER_SITE_OTHER): Payer: BC Managed Care – PPO | Admitting: Family Medicine

## 2016-04-29 VITALS — BP 118/64 | HR 99 | Temp 99.3°F | Resp 16 | Ht 77.0 in | Wt 314.6 lb

## 2016-04-29 DIAGNOSIS — R809 Proteinuria, unspecified: Secondary | ICD-10-CM | POA: Diagnosis not present

## 2016-04-29 DIAGNOSIS — E1129 Type 2 diabetes mellitus with other diabetic kidney complication: Secondary | ICD-10-CM

## 2016-04-29 DIAGNOSIS — J449 Chronic obstructive pulmonary disease, unspecified: Secondary | ICD-10-CM | POA: Diagnosis not present

## 2016-04-29 DIAGNOSIS — G4733 Obstructive sleep apnea (adult) (pediatric): Secondary | ICD-10-CM | POA: Diagnosis not present

## 2016-04-29 DIAGNOSIS — E785 Hyperlipidemia, unspecified: Secondary | ICD-10-CM

## 2016-04-29 DIAGNOSIS — E1142 Type 2 diabetes mellitus with diabetic polyneuropathy: Secondary | ICD-10-CM

## 2016-04-29 DIAGNOSIS — F418 Other specified anxiety disorders: Secondary | ICD-10-CM

## 2016-04-29 DIAGNOSIS — I10 Essential (primary) hypertension: Secondary | ICD-10-CM | POA: Diagnosis not present

## 2016-04-29 DIAGNOSIS — I700 Atherosclerosis of aorta: Secondary | ICD-10-CM | POA: Diagnosis not present

## 2016-04-29 LAB — POCT GLYCOSYLATED HEMOGLOBIN (HGB A1C): Hemoglobin A1C: 6.3

## 2016-04-29 LAB — POCT UA - MICROALBUMIN: Microalbumin Ur, POC: 50 mg/L

## 2016-04-29 MED ORDER — CITALOPRAM HYDROBROMIDE 20 MG PO TABS: 20.0000 mg | ORAL_TABLET | Freq: Every day | ORAL | 1 refills | Status: DC

## 2016-04-29 MED ORDER — ATENOLOL 25 MG PO TABS: 25.0000 mg | ORAL_TABLET | Freq: Every evening | ORAL | 1 refills | Status: DC

## 2016-04-29 MED ORDER — LISINOPRIL 40 MG PO TABS: 40.0000 mg | ORAL_TABLET | Freq: Every day | ORAL | 1 refills | Status: DC

## 2016-04-29 MED ORDER — ALBUTEROL SULFATE HFA 108 (90 BASE) MCG/ACT IN AERS: 2.0000 | INHALATION_SPRAY | Freq: Four times a day (QID) | RESPIRATORY_TRACT | 0 refills | Status: DC

## 2016-04-29 MED ORDER — ROSUVASTATIN CALCIUM 20 MG PO TABS: 20.0000 mg | ORAL_TABLET | Freq: Every day | ORAL | 1 refills | Status: DC

## 2016-04-29 NOTE — Progress Notes (Signed)
Name: Isaac Cross   MRN: [id number]    DOB: 03-14-1961   Date:04/29/2016       Progress Note  Subjective  Chief Complaint  Chief Complaint  Patient presents with  . Diabetes    pt stated that he is not checking blood sugars on regular basis  . Hyperlipidemia  . Hypertension    HPI   DMII: FSBS has not been checked lately. Taking Metformin 1000 mg twice daily and denies side effects. 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 He has been doing well, denies hypoglycemic episodes. He is following a diabetic diet, hgbA1C is at goal at 6.3%. Still losing weight, he states he eats breakfast and also a larger lunch but skipping dinner. Discussed eating salad or vegetables at night  HTN: taking medication, bp is towards the low end of normal,  denies side effects,  no palpitation, no chest pain, he states occasionally he gets dizzy when he sitting for a long time and stands up ( only when travelling ) but does not want to go down on bp medication dose at this time  Depression/Anxiety: taking Citalopram, in remission. Denies any symptoms of depression or anxiety at this time. He does not want to stop medication   Hyperlipidemia: last lipid panel was in excellent control, continue Crestor. No myalgia or chest pain  COPD /Asthma: We changed from Advair to [facility] Spring 2017, he uses Breo once or twice weekly when he wakes up feeling tight. He states that gets more symptomatic this time of the year secondary to humidity and when very cold. He has not used rescue in a long time. He has morning cough but  no wheezing, denies current SOB. Advised to take Breo daily to control morning cough  OSA: complaint with CPAP, wears all night.  Wakes up feeling refreshed and no morning headache  History of hematuria: seen by  Dr. Karna Dupes, had a repeat CT , he has renal cysts otherwise negative studies. CT showed fatty liver and atherosclerosis of abdominal aorta. He is on high dose statin for atherosclerosis and has been released by Dr. Karna Dupes  OA: he has noticed daily right  knee pain and popping sensation, no instability, he occasionally limps because of pain.  Aching pain, no radiation, right now is 0/10, goes up to 5-6/10 when standing for a prolonged period of time. Triggered by activity and better with rest and Tylenol   Patient Active Problem List   Diagnosis Date Noted  . Atherosclerosis of abdominal aorta ([place]) 06/17/2015  . Fatty liver disease, nonalcoholic 00/76/2263  . Renal cyst, right 06/17/2015  . BPH (benign prostatic hyperplasia) 05/25/2015  . Microscopic hematuria 05/25/2015  . Second degree hemorrhoids   . Diverticulosis of large intestine without diverticulitis   . Asthma, mild intermittent, well-controlled 10/16/2014  . Allergic rhinitis 10/13/2014  . ED (erectile dysfunction) of organic origin 07/22/2014  . Gastro-esophageal reflux disease without esophagitis 07/22/2014  . Benign hypertension 07/22/2014  . 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 Adult BMI 30+ 07/22/2014  . Chronic obstructive asthma ([place]) 07/22/2014  . Depression with anxiety 07/22/2014  . Dyslipidemia 07/22/2014  . Genital herpes 07/22/2014  . 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 Osteoarthritis of both knees 07/22/2014  . Obstructive apnea 05/14/2013  .  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 Blood in the urine 09/20/2006    Past Surgical History:  Procedure Laterality Date  . BLADDER REPAIR    . COLONOSCOPY WITH PROPOFOL N/A 04/07/2015   Procedure: COLONOSCOPY WITH PROPOFOL;  Surgeon: Lucilla Lame, MD;  Location: [facility];  Service: Endoscopy;  Laterality: N/A;  Diabetic - oral meds CPAP  . HAND SURGERY     4TH AND 5TH FINGER  . KNEE ARTHROSCOPY Right   . VEIN  SURGERY Right     Family History  Problem Relation Age of Onset  . Hypertension Mother   . Diabetes Mother   . Diabetes Father   . Hypertension Father   . Cancer Father   . Seizures Daughter   . Cancer Maternal Grandfather     Prostate  . Diabetes Paternal Grandfather   . Bladder Cancer Neg Hx   . Prostate cancer Maternal Uncle   . Kidney cancer Neg Hx     Social History   Social History  . Marital status: Married    Spouse name: N/A  . Number of children: N/A  . Years of education: N/A   Occupational History  . Not on file.   Social History Main Topics  . Smoking status: Former Smoker    Packs/day: 1.00    Years: 10.00    Types: Cigarettes    Start date: 04/20/1995    Quit date: 04/19/2005  . Smokeless tobacco: Never Used  . Alcohol use 6.0 oz/week    10 Cans of beer per week     Comment: occasional  . Drug use: No  . Sexual activity: Yes    Partners: Female   Other Topics Concern  . Not on file   Social History Narrative  . No narrative on file     Current Outpatient Prescriptions:  .  acetaminophen (TYLENOL) 500 MG tablet, Take 1-2 tablets (500-1,000 mg total) by mouth every 8 (eight) hours as needed., Disp: 90 tablet, Rfl: 0 .  albuterol (PROVENTIL HFA;VENTOLIN HFA) 108 (90 Base) MCG/ACT inhaler, Inhale 2 puffs into the lungs 4 (four) times daily., Disp: 1 Inhaler, Rfl: 0 .  aspirin 81 MG tablet, Take 81 mg by mouth daily., Disp: , Rfl:  .  atenolol (TENORMIN) 25 MG tablet, Take 1 tablet (25 mg total) by mouth every evening., Disp: 90 tablet, Rfl: 1 .  cetirizine (ZYRTEC) 10 MG tablet, Take 10 mg by mouth daily., Disp: , Rfl:  .  citalopram (CELEXA) 20 MG tablet, Take 1 tablet (20 mg total) by mouth daily., Disp: 90 tablet, Rfl: 1 .  diclofenac sodium (VOLTAREN) 1 % GEL, Apply 4 g topically 4 (four) times daily., Disp: 100 g, Rfl: 0 .  fluticasone furoate-vilanterol (BREO ELLIPTA) 100-25 MCG/INH AEPB, Inhale 1 puff into the lungs daily., Disp: 60 each,  Rfl: 5 .  glucose blood test strip, 1 strip by Other route 2 (two) times daily., Disp: , Rfl:  .  lisinopril (PRINIVIL,ZESTRIL) 40 MG tablet, Take 1 tablet (40 mg total) by mouth daily., Disp: 90 tablet, Rfl: 1 .  metFORMIN (GLUCOPHAGE) 1000 MG tablet, Take 1 tablet (1,000 mg total) by mouth 2 (two) times daily with a meal., Disp: 180 tablet, Rfl: 1 .  Multiple Vitamins-Minerals (MENS MULTIVITAMIN PLUS) TABS, Take by mouth., Disp: , Rfl:  .  Omega-3 Fatty Acids (FISH OIL) 1000 MG CAPS, Take by mouth daily., Disp: , Rfl:  .  rosuvastatin (CRESTOR) 20 MG tablet, Take 1 tablet (20 mg total)  by mouth daily., Disp: 90 tablet, Rfl: 1  Allergies  Allergen Reactions  . Lipitor [Atorvastatin] Hives and Itching  . Viagra  [Sildenafil Citrate]     vision loss     ROS  Constitutional: Negative for fever or weight change.  Respiratory: Negative for cough and shortness of breath.   Cardiovascular: Negative for chest pain or palpitations.  Gastrointestinal: Negative for abdominal pain, no bowel changes.  Musculoskeletal: Negative for gait problem or joint swelling.  Skin: Negative for rash.  Neurological: Negative for dizziness or headache.  No other specific complaints in a complete review of systems (except as listed in HPI above).  Objective  Vitals:   04/29/16 0846  BP: 118/64  Pulse: 99  Resp: 16  Temp: 99.3 F (37.4 C)  SpO2: 98%  Weight: (!) 314 lb 9 oz (142.7 kg)  Height: 6\' 5"  (1.956 m)    Body mass index is 37.3 kg/m.  Physical Exam  Constitutional: Patient appears well-developed and well-nourished. Obese  No distress.  HEENT: head atraumatic, normocephalic, pupils equal and reactive to light,  neck supple, throat within normal limits Cardiovascular: Normal rate, regular rhythm and normal heart sounds.  No murmur heard. No BLE edema. Pulmonary/Chest: Effort normal and breath sounds normal. No respiratory distress. Abdominal: Soft.  There is no tenderness. Psychiatric:  Patient has a normal mood and affect. behavior is normal. Judgment and thought content normal. Muscular Skeletal: no effusion of knee, crepitus with extension of both knees  Recent Results (from the past 2160 hour(s))  POCT HgB A1C     Status: Abnormal   Collection Time: 04/29/16  8:49 AM  Result Value Ref Range   Hemoglobin A1C 6.3   POCT UA - Microalbumin     Status: Abnormal   Collection Time: 04/29/16  8:49 AM  Result Value Ref Range   Microalbumin Ur, POC 50 mg/L   Creatinine, POC  mg/dL   Albumin/Creatinine Ratio, Urine, POC      Diabetic Foot Exam: Diabetic Foot Exam - Simple   Simple Foot Form Diabetic Foot exam was performed with the following findings:  Yes 04/29/2016  9:41 AM  Visual Inspection See comments:  Yes Sensation Testing Intact to touch and monofilament testing bilaterally:  Yes Pulse Check Posterior Tibialis and Dorsalis pulse intact bilaterally:  Yes Comments He has brittle toenails and a pea size small healing wound on left mid foot ( stepped on a belt buckle )      PHQ2/9: Depression screen [facility] 2/9 04/29/2016 01/26/2016 10/23/2015 06/17/2015 01/01/2015  Decreased Interest 0 0 0 0 0  Down, Depressed, Hopeless 0 0 0 0 0  PHQ - 2 Score 0 0 0 0 0     Fall Risk: Fall Risk  04/29/2016 01/26/2016 10/23/2015 06/17/2015 01/01/2015  Falls in the past year? No No No No No      Functional Status Survey: Is the patient deaf or have difficulty hearing?: No Does the patient have difficulty seeing, even when wearing glasses/contacts?: No Does the patient have difficulty concentrating, remembering, or making decisions?: No Does the patient have difficulty walking or climbing stairs?: No Does the patient have difficulty dressing or bathing?: No Does the patient have difficulty doing errands alone such as visiting a doctor's office or shopping?: No    Assessment & Plan  1. Type 2 diabetes mellitus with microalbuminuria, without long-term current use of insulin  (HCC)  - POCT HgB A1C - POCT UA - Microalbumin - lisinopril (PRINIVIL,ZESTRIL) 40 MG tablet; Take  1 tablet (40 mg total) by mouth daily.  Dispense: 90 tablet; Refill: 1  2. Benign hypertension  - lisinopril (PRINIVIL,ZESTRIL) 40 MG tablet; Take 1 tablet (40 mg total) by mouth daily.  Dispense: 90 tablet; Refill: 1 - atenolol (TENORMIN) 25 MG tablet; Take 1 tablet (25 mg total) by mouth every evening.  Dispense: 90 tablet; Refill: 1  3. Dyslipidemia  - rosuvastatin (CRESTOR) 20 MG tablet; Take 1 tablet (20 mg total) by mouth daily.  Dispense: 90 tablet; Refill: 1  4. Atherosclerosis of abdominal aorta (HCC)  Continue statin, aspirin and bp and DM control   5. Diabetic polyneuropathy associated with type 2 diabetes mellitus (HCC)  stable  6. COPD with asthma ([place])  - albuterol (PROVENTIL HFA;VENTOLIN HFA) 108 (90 Base) MCG/ACT inhaler; Inhale 2 puffs into the lungs 4 (four) times daily.  Dispense: 1 Inhaler; Refill: 0  7. Obstructive apnea  Continue CPAP machine  8. Depression with anxiety  In remission - citalopram (CELEXA) 20 MG tablet; Take 1 tablet (20 mg total) by mouth daily.  Dispense: 90 tablet; Refill: 1

## 2016-05-14 ENCOUNTER — Other Ambulatory Visit: Payer: Self-pay | Admitting: Family Medicine

## 2016-05-14 DIAGNOSIS — M170 Bilateral primary osteoarthritis of knee: Secondary | ICD-10-CM

## 2016-07-30 ENCOUNTER — Encounter: Payer: Self-pay | Admitting: Family Medicine

## 2016-07-30 ENCOUNTER — Ambulatory Visit (INDEPENDENT_AMBULATORY_CARE_PROVIDER_SITE_OTHER): Payer: BC Managed Care – PPO | Admitting: Family Medicine

## 2016-07-30 VITALS — BP 120/70 | HR 95 | Temp 98.7°F | Resp 16 | Ht 77.0 in | Wt 315.0 lb

## 2016-07-30 DIAGNOSIS — R809 Proteinuria, unspecified: Secondary | ICD-10-CM | POA: Diagnosis not present

## 2016-07-30 DIAGNOSIS — G4733 Obstructive sleep apnea (adult) (pediatric): Secondary | ICD-10-CM | POA: Diagnosis not present

## 2016-07-30 DIAGNOSIS — R21 Rash and other nonspecific skin eruption: Secondary | ICD-10-CM | POA: Diagnosis not present

## 2016-07-30 DIAGNOSIS — J4489 Other specified chronic obstructive pulmonary disease: Secondary | ICD-10-CM

## 2016-07-30 DIAGNOSIS — I10 Essential (primary) hypertension: Secondary | ICD-10-CM | POA: Diagnosis not present

## 2016-07-30 DIAGNOSIS — F418 Other specified anxiety disorders: Secondary | ICD-10-CM | POA: Diagnosis not present

## 2016-07-30 DIAGNOSIS — I700 Atherosclerosis of aorta: Secondary | ICD-10-CM

## 2016-07-30 DIAGNOSIS — M170 Bilateral primary osteoarthritis of knee: Secondary | ICD-10-CM | POA: Diagnosis not present

## 2016-07-30 DIAGNOSIS — E1129 Type 2 diabetes mellitus with other diabetic kidney complication: Secondary | ICD-10-CM

## 2016-07-30 DIAGNOSIS — E785 Hyperlipidemia, unspecified: Secondary | ICD-10-CM

## 2016-07-30 DIAGNOSIS — J449 Chronic obstructive pulmonary disease, unspecified: Secondary | ICD-10-CM | POA: Diagnosis not present

## 2016-07-30 DIAGNOSIS — E1142 Type 2 diabetes mellitus with diabetic polyneuropathy: Secondary | ICD-10-CM

## 2016-07-30 LAB — POCT GLYCOSYLATED HEMOGLOBIN (HGB A1C): Hemoglobin A1C: 6.1

## 2016-07-30 MED ORDER — ATENOLOL 25 MG PO TABS: 25.0000 mg | ORAL_TABLET | Freq: Every evening | ORAL | 1 refills | Status: DC

## 2016-07-30 MED ORDER — TRIAMCINOLONE ACETONIDE 0.1 % EX CREA: 1.0000 "application " | TOPICAL_CREAM | Freq: Two times a day (BID) | CUTANEOUS | 0 refills | Status: DC

## 2016-07-30 MED ORDER — CITALOPRAM HYDROBROMIDE 20 MG PO TABS: 20.0000 mg | ORAL_TABLET | Freq: Every day | ORAL | 1 refills | Status: DC

## 2016-07-30 MED ORDER — LISINOPRIL 40 MG PO TABS: 40.0000 mg | ORAL_TABLET | Freq: Every day | ORAL | 1 refills | Status: DC

## 2016-07-30 MED ORDER — FLUTICASONE FUROATE-VILANTEROL 100-25 MCG/INH IN AEPB: 1.0000 | INHALATION_SPRAY | Freq: Every day | RESPIRATORY_TRACT | 5 refills | Status: DC

## 2016-07-30 MED ORDER — METFORMIN HCL 1000 MG PO TABS: 1000.0000 mg | ORAL_TABLET | Freq: Two times a day (BID) | ORAL | 1 refills | Status: DC

## 2016-07-30 MED ORDER — ROSUVASTATIN CALCIUM 20 MG PO TABS: 20.0000 mg | ORAL_TABLET | Freq: Every day | ORAL | 1 refills | Status: DC

## 2016-07-30 MED ORDER — TERBINAFINE HCL 1 % EX CREA: 1.0000 "application " | TOPICAL_CREAM | Freq: Two times a day (BID) | CUTANEOUS | 0 refills | Status: DC

## 2016-07-30 NOTE — Progress Notes (Signed)
Name: Isaac Cross   MRN: [id number]    DOB: 1960-05-20   Date:07/30/2016       Progress Note  Subjective  Chief Complaint  Chief Complaint  Patient presents with  . Diabetes    3 month follow up  . Hyperlipidemia  . Hypertension  . Depression  . Anxiety    HPI  DMII: FSBS has not been checked lately. Taking Metformin 1000 mg twice daily and denies side effects. 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 He has been doing well, denies hypoglycemic episodes. He is following a diabetic diet, hgbA1C is at goal last time it was 6.3% and today is at 6.1% ( he does not want to decrease medication at this time)  Weight has been stable.  H e states he eats breakfast and also a larger lunch but skipping dinner, sometimes a hard boiled egg, or small snack at night.  HTN: taking medication, bp is at goal now,  denies side effects, no palpitation, no chest pain, he states occasionally he gets dizzy when he sitting for a long time and stands up ( only when travelling ) but does not want to go down on bp medication dose at this time  Depression/Anxiety: taking Citalopram, in remission. Denies any symptoms of depression or anxiety at this time. He does not want to stop medication   Hyperlipidemia: last lipid panel was in excellent control, continue Crestor. No myalgia or chest pain  COPD /Asthma: We changed from Advair to [facility] Spring 2017, he uses Breo once a day when he had a recent URI, and has stayed on it once daily.  He has not used rescue in a long time. He has morning cough that is usually productive, but  no wheezing, denies current SOB.   OSA: complaint with CPAP, wears all night. Wakes up feeling refreshed and no morning headache  History of hematuria: seen by Dr. Karna Dupes, had a repeat CT , he has  renal cysts otherwise negative studies. CT showed fatty liver and atherosclerosis of abdominal aorta. He is on high dose statin for atherosclerosis and has been released by Dr. Karna Dupes  OA: he has noticed daily right  knee pain and popping sensation, no instability, he occasionally limps because of pain. Aching pain, no radiation, right now is 0/10, goes up to 510 when standing for a prolonged period of time. Triggered by activity and better with rest and Tylenol   Rash: he noticed a very small itchy spot on right lower medial leg ( neal ankle ) about 2 months ago, it has grown to the size of a nickel, he also has another spot on the medical thigh, smaller in size. He has tried topical antibiotics and Calamine lotion also blue star ointment without help.   Patient Active Problem List   Diagnosis Date Noted  . Atherosclerosis of abdominal aorta ([place]) 06/17/2015  . Fatty liver disease, nonalcoholic 34/28/7681  . Renal cyst, right 06/17/2015  . BPH (benign prostatic hyperplasia) 05/25/2015  . Microscopic hematuria 05/25/2015  . Second degree hemorrhoids   . Diverticulosis of large intestine without diverticulitis   . Asthma, mild intermittent, well-controlled 10/16/2014  . Allergic rhinitis 10/13/2014  . ED (erectile dysfunction) of organic origin 07/22/2014  . Gastro-esophageal reflux disease without esophagitis 07/22/2014  . Benign  hypertension 07/22/2014  . 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[place]) 07/22/2014  . Adult BMI 30+ 07/22/2014  . Chronic obstructive asthma ([place]) 07/22/2014  . Depression with anxiety 07/22/2014  . Dyslipidemia 07/22/2014  . Genital herpes 07/22/2014  . 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 Osteoarthritis of both knees 07/22/2014  . Obstructive apnea 05/14/2013  . 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 Blood in the urine 09/20/2006    Past Surgical History:  Procedure Laterality Date  . BLADDER REPAIR    . COLONOSCOPY WITH PROPOFOL N/A  04/07/2015   Procedure: COLONOSCOPY WITH PROPOFOL;  Surgeon: Lucilla Lame, MD;  Location: [facility];  Service: Endoscopy;  Laterality: N/A;  Diabetic - oral meds CPAP  . HAND SURGERY     4TH AND 5TH FINGER  . KNEE ARTHROSCOPY Right   . VEIN SURGERY Right     Family History  Problem Relation Age of Onset  . Hypertension Mother   . Diabetes Mother   . Diabetes Father   . Hypertension Father   . Cancer Father   . Seizures Daughter   . Cancer Maternal Grandfather     Prostate  . Diabetes Paternal Grandfather   . Bladder Cancer Neg Hx   . Prostate cancer Maternal Uncle   . Kidney cancer Neg Hx     Social History   Social History  . Marital status: Married    Spouse name: N/A  . Number of children: N/A  . Years of education: N/A   Occupational History  . Not on file.   Social History Main Topics  . Smoking status: Former Smoker    Packs/day: 1.00    Years: 10.00    Types: Cigarettes    Start date: 04/20/1995    Quit date: 04/19/2005  . Smokeless tobacco: Never Used  . Alcohol use 6.0 oz/week    10 Cans of beer per week     Comment: occasional  . Drug use: No  . Sexual activity: Yes    Partners: Female   Other Topics Concern  . Not on file   Social History Narrative  . No narrative on file     Current Outpatient Prescriptions:  .  fluticasone (FLONASE) 50 MCG/ACT nasal spray, Place into the nose., Disp: , Rfl:  .  acetaminophen (TYLENOL) 500 MG tablet, Take 1-2 tablets (500-1,000 mg total) by mouth every 8 (eight) hours as needed., Disp: 90 tablet, Rfl: 0 .  albuterol (PROVENTIL HFA;VENTOLIN HFA) 108 (90 Base) MCG/ACT inhaler, Inhale 2 puffs into the lungs 4 (four) times daily., Disp: 1 Inhaler, Rfl: 0 .  aspirin 81 MG tablet, Take 81 mg by mouth daily., Disp: , Rfl:  .  atenolol (TENORMIN) 25 MG tablet, Take 1 tablet (25 mg total) by mouth every evening., Disp: 90 tablet, Rfl: 1 .  cetirizine (ZYRTEC) 10 MG tablet, Take 10 mg by mouth daily., Disp: ,  Rfl:  .  citalopram (CELEXA) 20 MG tablet, Take 1 tablet (20 mg total) by mouth daily., Disp: 90 tablet, Rfl: 1 .  diclofenac sodium (VOLTAREN) 1 % GEL, APPLY 4 GRAMS TOPICALLY FOUR TIMES A DAY, Disp: 100 g, Rfl: 2 .  fluticasone furoate-vilanterol (BREO ELLIPTA) 100-25 MCG/INH AEPB, Inhale 1 puff into the lungs daily., Disp: 60 each, Rfl: 5 .  glucose blood test strip, 1 strip by Other route 2 (two) times daily., Disp: , Rfl:  .  lisinopril (PRINIVIL,ZESTRIL) 40 MG tablet, Take 1 tablet (40 mg total) by mouth daily., Disp:  90 tablet, Rfl: 1 .  metFORMIN (GLUCOPHAGE) 1000 MG tablet, Take 1 tablet (1,000 mg total) by mouth 2 (two) times daily with a meal., Disp: 180 tablet, Rfl: 1 .  Multiple Vitamins-Minerals (MENS MULTIVITAMIN PLUS) TABS, Take by mouth., Disp: , Rfl:  .  Omega-3 Fatty Acids (FISH OIL) 1000 MG CAPS, Take by mouth daily., Disp: , Rfl:  .  rosuvastatin (CRESTOR) 20 MG tablet, Take 1 tablet (20 mg total) by mouth daily., Disp: 90 tablet, Rfl: 1 .  terbinafine (LAMISIL AT) 1 % cream, Apply 1 application topically 2 (two) times daily., Disp: 30 g, Rfl: 0 .  triamcinolone cream (KENALOG) 0.1 %, Apply 1 application topically 2 (two) times daily., Disp: 30 g, Rfl: 0  Allergies  Allergen Reactions  . Lipitor [Atorvastatin] Hives and Itching  . Viagra  [Sildenafil Citrate]     vision loss     ROS  Constitutional: Negative for fever or weight change.  Respiratory: Negative for cough and shortness of breath.   Cardiovascular: Negative for chest pain or palpitations.  Gastrointestinal: Negative for abdominal pain, no bowel changes.  Musculoskeletal: Negative for gait problem or joint swelling.  Skin: Positive  for rash.  Neurological: Negative for dizziness or headache.  No other specific complaints in a complete review of systems (except as listed in HPI above).  Objective  Vitals:   07/30/16 0903  BP: 120/70  Pulse: 95  Resp: 16  Temp: 98.7 F (37.1 C)  SpO2: 97%   Weight: (!) 315 lb (142.9 kg)  Height: 6\' 5"  (1.956 m)    Body mass index is 37.35 kg/m.  Physical Exam  Constitutional: Patient appears well-developed and well-nourished. Obese No distress.  HEENT: head atraumatic, normocephalic, pupils equal and reactive to light,  neck supple, throat within normal limits Cardiovascular: Normal rate, regular rhythm and normal heart sounds.  No murmur heard. 1 plus  BLE edema. Varicose veins Pulmonary/Chest: Effort normal and breath sounds normal. No respiratory distress. Abdominal: Soft.  There is no tenderness. Psychiatric: Patient has a normal mood and affect. behavior is normal. Judgment and thought content normal.  Recent Results (from the past 2160 hour(s))  POCT HgB A1C     Status: Normal   Collection Time: 07/30/16  9:07 AM  Result Value Ref Range   Hemoglobin A1C 6.1     PHQ2/9: Depression screen [facility] 2/9 07/30/2016 04/29/2016 01/26/2016 10/23/2015 06/17/2015  Decreased Interest 0 0 0 0 0  Down, Depressed, Hopeless 0 0 0 0 0  PHQ - 2 Score 0 0 0 0 0    Fall Risk: Fall Risk  07/30/2016 04/29/2016 01/26/2016 10/23/2015 06/17/2015  Falls in the past year? No No No No No    Assessment & Plan  1. Type 2 diabetes mellitus with microalbuminuria, without long-term current use of insulin (HCC)  - POCT HgB A1C - lisinopril (PRINIVIL,ZESTRIL) 40 MG tablet; Take 1 tablet (40 mg total) by mouth daily.  Dispense: 90 tablet; Refill: 1 - metFORMIN (GLUCOPHAGE) 1000 MG tablet; Take 1 tablet (1,000 mg total) by mouth 2 (two) times daily with a meal.  Dispense: 180 tablet; Refill: 1  2. Benign hypertension  - lisinopril (PRINIVIL,ZESTRIL) 40 MG tablet; Take 1 tablet (40 mg total) by mouth daily.  Dispense: 90 tablet; Refill: 1 - atenolol (TENORMIN) 25 MG tablet; Take 1 tablet (25 mg total) by mouth every evening.  Dispense: 90 tablet; Refill: 1  3. Dyslipidemia  - rosuvastatin (CRESTOR) 20 MG tablet; Take 1 tablet (20 mg  total) by mouth daily.   Dispense: 90 tablet; Refill: 1  4. Atherosclerosis of abdominal aorta (HCC)  Continue aspirin, statin  5. COPD with asthma ([place])  - fluticasone furoate-vilanterol (BREO ELLIPTA) 100-25 MCG/INH AEPB; Inhale 1 puff into the lungs daily.  Dispense: 60 each; Refill: 5  6. Obstructive apnea  Continue CPAP  7. Depression with anxiety  - citalopram (CELEXA) 20 MG tablet; Take 1 tablet (20 mg total) by mouth daily.  Dispense: 90 tablet; Refill: 1  8. Diabetic polyneuropathy associated with type 2 diabetes mellitus (HCC)  - metFORMIN (GLUCOPHAGE) 1000 MG tablet; Take 1 tablet (1,000 mg total) by mouth 2 (two) times daily with a meal.  Dispense: 180 tablet; Refill: 1  9. Primary osteoarthritis of both knees  Continue Tylenol   10. Rash, skin  Advised to call me back if no improvement, and we will refer him to Dermatologist   - terbinafine (LAMISIL AT) 1 % cream; Apply 1 application topically 2 (two) times daily.  Dispense: 30 g; Refill: 0 - triamcinolone cream (KENALOG) 0.1 %; Apply 1 application topically 2 (two) times daily.  Dispense: 30 g; Refill: 0

## 2016-12-01 ENCOUNTER — Ambulatory Visit: Payer: BC Managed Care – PPO | Admitting: Family Medicine

## 2016-12-08 ENCOUNTER — Encounter: Payer: Self-pay | Admitting: Family Medicine

## 2016-12-08 ENCOUNTER — Ambulatory Visit (INDEPENDENT_AMBULATORY_CARE_PROVIDER_SITE_OTHER): Payer: BC Managed Care – PPO | Admitting: Family Medicine

## 2016-12-08 VITALS — BP 124/70 | HR 109 | Temp 98.1°F | Resp 16 | Ht 77.0 in | Wt 321.2 lb

## 2016-12-08 DIAGNOSIS — J449 Chronic obstructive pulmonary disease, unspecified: Secondary | ICD-10-CM

## 2016-12-08 DIAGNOSIS — E1142 Type 2 diabetes mellitus with diabetic polyneuropathy: Secondary | ICD-10-CM | POA: Diagnosis not present

## 2016-12-08 DIAGNOSIS — E1129 Type 2 diabetes mellitus with other diabetic kidney complication: Secondary | ICD-10-CM

## 2016-12-08 DIAGNOSIS — G4733 Obstructive sleep apnea (adult) (pediatric): Secondary | ICD-10-CM

## 2016-12-08 DIAGNOSIS — M170 Bilateral primary osteoarthritis of knee: Secondary | ICD-10-CM

## 2016-12-08 DIAGNOSIS — Z23 Encounter for immunization: Secondary | ICD-10-CM

## 2016-12-08 DIAGNOSIS — R809 Proteinuria, unspecified: Secondary | ICD-10-CM | POA: Diagnosis not present

## 2016-12-08 DIAGNOSIS — E785 Hyperlipidemia, unspecified: Secondary | ICD-10-CM | POA: Diagnosis not present

## 2016-12-08 DIAGNOSIS — I10 Essential (primary) hypertension: Secondary | ICD-10-CM | POA: Diagnosis not present

## 2016-12-08 DIAGNOSIS — F418 Other specified anxiety disorders: Secondary | ICD-10-CM

## 2016-12-08 LAB — CBC WITH DIFFERENTIAL/PLATELET
BASOS ABS: 0 {cells}/uL (ref 0–200)
BASOS PCT: 0 %
EOS ABS: 98 {cells}/uL (ref 15–500)
Eosinophils Relative: 2 %
HEMATOCRIT: 39.9 % (ref 38.5–50.0)
Hemoglobin: 13.3 g/dL (ref 13.2–17.1)
LYMPHS PCT: 36 %
Lymphs Abs: 1764 cells/uL (ref 850–3900)
MCH: 27.2 pg (ref 27.0–33.0)
MCHC: 33.3 g/dL (ref 32.0–36.0)
MCV: 81.6 fL (ref 80.0–100.0)
MONO ABS: 637 {cells}/uL (ref 200–950)
MPV: 9.7 fL (ref 7.5–12.5)
Monocytes Relative: 13 %
NEUTROS PCT: 49 %
Neutro Abs: 2401 cells/uL (ref 1500–7800)
Platelets: 219 10*3/uL (ref 140–400)
RBC: 4.89 MIL/uL (ref 4.20–5.80)
RDW: 14 % (ref 11.0–15.0)
WBC: 4.9 10*3/uL (ref 3.8–10.8)

## 2016-12-08 LAB — POCT GLYCOSYLATED HEMOGLOBIN (HGB A1C): Hemoglobin A1C: 6.4

## 2016-12-08 LAB — HM DIABETES EYE EXAM

## 2016-12-08 MED ORDER — ALBUTEROL SULFATE HFA 108 (90 BASE) MCG/ACT IN AERS: 2.0000 | INHALATION_SPRAY | Freq: Four times a day (QID) | RESPIRATORY_TRACT | 0 refills | Status: DC

## 2016-12-08 MED ORDER — LISINOPRIL 40 MG PO TABS: 40.0000 mg | ORAL_TABLET | Freq: Every day | ORAL | 1 refills | Status: DC

## 2016-12-08 MED ORDER — ATENOLOL 25 MG PO TABS: 25.0000 mg | ORAL_TABLET | Freq: Every evening | ORAL | 1 refills | Status: DC

## 2016-12-08 MED ORDER — FLUTICASONE FUROATE-VILANTEROL 100-25 MCG/INH IN AEPB: 1.0000 | INHALATION_SPRAY | Freq: Every day | RESPIRATORY_TRACT | 5 refills | Status: DC

## 2016-12-08 MED ORDER — ROSUVASTATIN CALCIUM 20 MG PO TABS: 20.0000 mg | ORAL_TABLET | Freq: Every day | ORAL | 1 refills | Status: DC

## 2016-12-08 MED ORDER — ACETAMINOPHEN 500 MG PO TABS: 500.0000 mg | ORAL_TABLET | Freq: Four times a day (QID) | ORAL | 0 refills | Status: DC | PRN

## 2016-12-08 MED ORDER — METFORMIN HCL 1000 MG PO TABS: 1000.0000 mg | ORAL_TABLET | Freq: Two times a day (BID) | ORAL | 1 refills | Status: DC

## 2016-12-08 MED ORDER — CITALOPRAM HYDROBROMIDE 20 MG PO TABS: 20.0000 mg | ORAL_TABLET | Freq: Every day | ORAL | 1 refills | Status: DC

## 2016-12-08 NOTE — Progress Notes (Signed)
Name: Isaac Cross   MRN: [id number]    DOB: May 24, 1960   Date:12/08/2016       Progress Note  Subjective  Chief Complaint  Chief Complaint  Patient presents with  . Diabetes    4 month follow up not checking  . Hypertension    no issues  . Gastroesophageal Reflux    no issues  . Benign Prostatic Hypertrophy    HPI  DMII: FSBS has not been checked lately. Taking Metformin 1000 mg twice daily and very seldom has diarrhea. 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  He has been doing well, denies hypoglycemic episodes. He is following a diabetic diet, hgbA1C is at goal last time it was 6.3% , 6.1% and 6.4% today.  He states he eats breakfast and also a larger lunch but skipping dinner, sometimes a hard boiled egg, or small snack at night. He is not interested in starting GLP-1 agonist to try to help with weight loss  HTN: taking medication, bp is at goal now, denies side effects, no palpitation, no chest pain, he states occasionally he n longer gets dizzy  Depression/Anxiety: taking Citalopram, in remission. Denies any symptoms of depression or anxiety at this time. He does not want to stop medication   Hyperlipidemia: last lipid panel was in excellent control, continue Crestor. No myalgia or chest pain  COPD /Asthma: We changed from Advair to [facility] 2017, he uses Breo daily now and states that controls his symptoms. Denies cough, wheezing or SOB. He smoked one pack daily for 15 years but quit 10 years ago.  OSA: complaint with CPAP, wears all night. Wakes up feeling refreshed and no morning headache, also denies snoring.   History of hematuria: seen by Dr. Karna Dupes, had a repeat CT , he has renal cysts otherwise negative studies. CT showed fatty liver and atherosclerosis of abdominal aorta. He is on high dose statin for atherosclerosis and has  been released by Dr. Karna Dupes  OA: he has noticed daily right knee pain and popping sensation, no instability, he occasionally limps because of pain, and having difficulty going downstairs. Aching pain, no radiation, right now 3/10, and goes up to 5/10 standing, pain can go up to 7/10. Triggered by activity and better with rest and Tylenol   Patient Active Problem List   Diagnosis Date Noted  . Atherosclerosis of abdominal aorta ([place]) 06/17/2015  . Fatty liver disease, nonalcoholic 26/37/8588  . Renal cyst, right 06/17/2015  . BPH (benign prostatic hyperplasia) 05/25/2015  . Microscopic hematuria 05/25/2015  . Second degree hemorrhoids   . Diverticulosis of large intestine without diverticulitis   . Asthma, mild intermittent, well-controlled 10/16/2014  . Allergic rhinitis 10/13/2014  . ED (erectile dysfunction) of organic origin 07/22/2014  . Gastro-esophageal reflux disease without esophagitis 07/22/2014  . Benign hypertension 07/22/2014  . 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[place]) 07/22/2014  . Adult BMI 30+ 07/22/2014  . Chronic obstructive asthma ([place]) 07/22/2014  . Depression with anxiety 07/22/2014  . Dyslipidemia 07/22/2014  . Genital herpes 07/22/2014  . 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 Osteoarthritis of both knees 07/22/2014  . Obstructive apnea 05/14/2013  . 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 Blood in the urine 09/20/2006    Past Surgical History:  Procedure Laterality Date  . BLADDER REPAIR    . COLONOSCOPY WITH PROPOFOL N/A 04/07/2015  Procedure: COLONOSCOPY WITH PROPOFOL;  Surgeon: Lucilla Lame, MD;  Location: [facility];  Service: Endoscopy;  Laterality: N/A;  Diabetic - oral meds CPAP  . HAND SURGERY     4TH AND 5TH FINGER  . KNEE ARTHROSCOPY Right   . VEIN SURGERY Right     Family History  Problem Relation Age of Onset  . Hypertension Mother   . Diabetes Mother   . Diabetes Father   . Hypertension Father   .  Cancer Father   . Seizures Daughter   . Cancer Maternal Grandfather        Prostate  . Diabetes Paternal Grandfather   . Bladder Cancer Neg Hx   . Prostate cancer Maternal Uncle   . Kidney cancer Neg Hx     Social History   Social History  . Marital status: Married    Spouse name: N/A  . Number of children: N/A  . Years of education: N/A   Occupational History  . Not on file.   Social History Main Topics  . Smoking status: Former Smoker    Packs/day: 1.00    Years: 10.00    Types: Cigarettes    Start date: 04/20/1995    Quit date: 04/19/2005  . Smokeless tobacco: Never Used  . Alcohol use 6.0 oz/week    10 Cans of beer per week     Comment: occasional  . Drug use: No  . Sexual activity: Yes    Partners: Female   Other Topics Concern  . Not on file   Social History Narrative  . No narrative on file     Current Outpatient Prescriptions:  .  acetaminophen (TYLENOL) 500 MG tablet, Take 1 tablet (500 mg total) by mouth every 6 (six) hours as needed., Disp: 90 tablet, Rfl: 0 .  albuterol (PROVENTIL HFA;VENTOLIN HFA) 108 (90 Base) MCG/ACT inhaler, Inhale 2 puffs into the lungs 4 (four) times daily., Disp: 1 Inhaler, Rfl: 0 .  aspirin 81 MG tablet, Take 81 mg by mouth daily., Disp: , Rfl:  .  atenolol (TENORMIN) 25 MG tablet, Take 1 tablet (25 mg total) by mouth every evening., Disp: 90 tablet, Rfl: 1 .  cetirizine (ZYRTEC) 10 MG tablet, Take 10 mg by mouth daily., Disp: , Rfl:  .  citalopram (CELEXA) 20 MG tablet, Take 1 tablet (20 mg total) by mouth daily., Disp: 90 tablet, Rfl: 1 .  diclofenac sodium (VOLTAREN) 1 % GEL, APPLY 4 GRAMS TOPICALLY FOUR TIMES A DAY, Disp: 100 g, Rfl: 2 .  fluticasone (FLONASE) 50 MCG/ACT nasal spray, Place into the nose., Disp: , Rfl:  .  fluticasone furoate-vilanterol (BREO ELLIPTA) 100-25 MCG/INH AEPB, Inhale 1 puff into the lungs daily., Disp: 60 each, Rfl: 5 .  glucose blood test strip, 1 strip by Other route 2 (two) times daily., Disp: ,  Rfl:  .  lisinopril (PRINIVIL,ZESTRIL) 40 MG tablet, Take 1 tablet (40 mg total) by mouth daily., Disp: 90 tablet, Rfl: 1 .  metFORMIN (GLUCOPHAGE) 1000 MG tablet, Take 1 tablet (1,000 mg total) by mouth 2 (two) times daily with a meal., Disp: 180 tablet, Rfl: 1 .  Multiple Vitamins-Minerals (MENS MULTIVITAMIN PLUS) TABS, Take by mouth., Disp: , Rfl:  .  Omega-3 Fatty Acids (FISH OIL) 1000 MG CAPS, Take by mouth daily., Disp: , Rfl:  .  rosuvastatin (CRESTOR) 20 MG tablet, Take 1 tablet (20 mg total) by mouth daily., Disp: 90 tablet, Rfl: 1 .  terbinafine (LAMISIL AT) 1 % cream, Apply  1 application topically 2 (two) times daily., Disp: 30 g, Rfl: 0 .  triamcinolone cream (KENALOG) 0.1 %, Apply 1 application topically 2 (two) times daily., Disp: 30 g, Rfl: 0  Allergies  Allergen Reactions  . Lipitor [Atorvastatin] Hives and Itching  . Viagra  [Sildenafil Citrate]     vision loss     ROS  Constitutional: Negative for fever, positive for  weight change.  Respiratory: Negative for cough and shortness of breath.   Cardiovascular: Negative for chest pain or palpitations.  Gastrointestinal: Negative for abdominal pain, no bowel changes.  Musculoskeletal: Positive  for gait problem but no  joint swelling.  Skin: Negative for rash.  Neurological: Negative for dizziness or headache.  No other specific complaints in a complete review of systems (except as listed in HPI above).  Objective  Vitals:   12/08/16 0849  BP: 124/70  Pulse: (!) 109  Resp: 16  Temp: 98.1 F (36.7 C)  SpO2: 95%  Weight: (!) 321 lb 3 oz (145.7 kg)  Height: 6\' 5"  (1.956 m)    Body mass index is 38.09 kg/m.  Physical Exam  Constitutional: Patient appears well-developed and well-nourished. Obese  No distress.  HEENT: head atraumatic, normocephalic, pupils equal and reactive to light, neck supple, throat within normal limits Cardiovascular: Normal rate, regular rhythm and normal heart sounds.  No murmur heard.  No BLE edema. Pulmonary/Chest: Effort normal and breath sounds normal. No respiratory distress. Abdominal: Soft.  There is no tenderness. Psychiatric: Patient has a normal mood and affect. behavior is normal. Judgment and thought content normal. Muscular Skeletal: crepitus with extension of both knees, pain with extension of right knee  Recent Results (from the past 2160 hour(s))  POCT HgB A1C     Status: Abnormal   Collection Time: 12/08/16  9:08 AM  Result Value Ref Range   Hemoglobin A1C 6.4       PHQ2/9: Depression screen [facility] 2/9 12/08/2016 07/30/2016 04/29/2016 01/26/2016 10/23/2015  Decreased Interest 0 0 0 0 0  Down, Depressed, Hopeless 0 0 0 0 0  PHQ - 2 Score 0 0 0 0 0     Fall Risk: Fall Risk  12/08/2016 07/30/2016 04/29/2016 01/26/2016 10/23/2015  Falls in the past year? No No No No No     Functional Status Survey: Is the patient deaf or have difficulty hearing?: No Does the patient have difficulty seeing, even when wearing glasses/contacts?: No Does the patient have difficulty concentrating, remembering, or making decisions?: No Does the patient have difficulty walking or climbing stairs?: No Does the patient have difficulty dressing or bathing?: No Does the patient have difficulty doing errands alone such as visiting a doctor's office or shopping?: No    Assessment & Plan  1. Type 2 diabetes mellitus with microalbuminuria, without long-term current use of insulin (HCC)  - POCT HgB A1C - metFORMIN (GLUCOPHAGE) 1000 MG tablet; Take 1 tablet (1,000 mg total) by mouth 2 (two) times daily with a meal.  Dispense: 180 tablet; Refill: 1 - lisinopril (PRINIVIL,ZESTRIL) 40 MG tablet; Take 1 tablet (40 mg total) by mouth daily.  Dispense: 90 tablet; Refill: 1  2. Dyslipidemia  - rosuvastatin (CRESTOR) 20 MG tablet; Take 1 tablet (20 mg total) by mouth daily.  Dispense: 90 tablet; Refill: 1 - Lipid panel  3. Diabetic polyneuropathy associated with type 2 diabetes mellitus  (HCC)  - metFORMIN (GLUCOPHAGE) 1000 MG tablet; Take 1 tablet (1,000 mg total) by mouth 2 (two) times daily with a meal.  Dispense: 180 tablet; Refill: 1  4. Benign hypertension  - lisinopril (PRINIVIL,ZESTRIL) 40 MG tablet; Take 1 tablet (40 mg total) by mouth daily.  Dispense: 90 tablet; Refill: 1 - atenolol (TENORMIN) 25 MG tablet; Take 1 tablet (25 mg total) by mouth every evening.  Dispense: 90 tablet; Refill: 1 - COMPLETE METABOLIC PANEL WITH GFR - CBC with Differential/Platelet  5. COPD with asthma ([place])  - fluticasone furoate-vilanterol (BREO ELLIPTA) 100-25 MCG/INH AEPB; Inhale 1 puff into the lungs daily.  Dispense: 60 each; Refill: 5 - albuterol (PROVENTIL HFA;VENTOLIN HFA) 108 (90 Base) MCG/ACT inhaler; Inhale 2 puffs into the lungs 4 (four) times daily.  Dispense: 1 Inhaler; Refill: 0  6. Depression with anxiety  - citalopram (CELEXA) 20 MG tablet; Take 1 tablet (20 mg total) by mouth daily.  Dispense: 90 tablet; Refill: 1  7. Primary osteoarthritis of both knees  - acetaminophen (TYLENOL) 500 MG tablet; Take 1 tablet (500 mg total) by mouth every 6 (six) hours as needed.  Dispense: 90 tablet; Refill: 0 Discussed referral to Ortho - but he wants to increase dose of Tylenol for now  8. Obstructive apnea  Wearing CPAP every night   9. Needs flu shot  - Flu Vaccine QUAD 36+ mos IM  10. Need for pneumococcal vaccination  - Pneumococcal conjugate vaccine 13-valent IM

## 2016-12-09 LAB — COMPLETE METABOLIC PANEL WITH GFR
ALBUMIN: 4.4 g/dL (ref 3.6–5.1)
ALK PHOS: 38 U/L — AB (ref 40–115)
ALT: 28 U/L (ref 9–46)
AST: 28 U/L (ref 10–35)
BUN: 9 mg/dL (ref 7–25)
CALCIUM: 9.6 mg/dL (ref 8.6–10.3)
CO2: 23 mmol/L (ref 20–32)
Chloride: 103 mmol/L (ref 98–110)
Creat: 0.9 mg/dL (ref 0.70–1.33)
Glucose, Bld: 140 mg/dL — ABNORMAL HIGH (ref 65–99)
POTASSIUM: 4.3 mmol/L (ref 3.5–5.3)
Sodium: 139 mmol/L (ref 135–146)
Total Bilirubin: 0.5 mg/dL (ref 0.2–1.2)
Total Protein: 7 g/dL (ref 6.1–8.1)

## 2016-12-09 LAB — LIPID PANEL
CHOL/HDL RATIO: 2.7 ratio (ref <5.0)
CHOLESTEROL: 129 mg/dL (ref <200)
HDL: 48 mg/dL (ref >40)
LDL Cholesterol: 61 mg/dL (ref <100)
TRIGLYCERIDES: 102 mg/dL (ref <150)
VLDL: 20 mg/dL (ref <30)

## 2017-01-26 ENCOUNTER — Encounter: Payer: Self-pay | Admitting: Family Medicine

## 2017-01-26 ENCOUNTER — Ambulatory Visit (INDEPENDENT_AMBULATORY_CARE_PROVIDER_SITE_OTHER): Payer: BC Managed Care – PPO | Admitting: Family Medicine

## 2017-01-26 VITALS — BP 132/74 | HR 110 | Temp 99.1°F | Resp 18 | Ht 77.0 in | Wt 325.3 lb

## 2017-01-26 DIAGNOSIS — J302 Other seasonal allergic rhinitis: Secondary | ICD-10-CM

## 2017-01-26 DIAGNOSIS — J3089 Other allergic rhinitis: Secondary | ICD-10-CM | POA: Diagnosis not present

## 2017-01-26 MED ORDER — LEVOCETIRIZINE DIHYDROCHLORIDE 5 MG PO TABS: 5.0000 mg | ORAL_TABLET | Freq: Every evening | ORAL | 0 refills | Status: DC

## 2017-01-26 MED ORDER — FIRST-DUKES MOUTHWASH MT SUSP: 15.0000 mL | Freq: Three times a day (TID) | OROMUCOSAL | 0 refills | Status: DC

## 2017-01-26 NOTE — Progress Notes (Signed)
Name: Isaac Cross   MRN: [id number]    DOB: 12-Nov-1960   Date:01/26/2017       Progress Note  Subjective  Chief Complaint  Chief Complaint  Patient presents with  . Sore Throat    Onset-2 weeks, off and on, sore and itchy throat, sinus drainage. Has tried gargling with warm salt water and helped some  . Itchy Ears    Bilateral ear itchiness. Patient wife has bronchitis and strep and wanted to be looked at.    HPI  AR: he is concerned about symptoms of itchy ears, nasal congestion, rhinorrhea and scratchy/itchy throat because his wife had a cold two weeks ago and had to go to Urgent care last week. He denies fever, chills, change in appetite , nausea or vomiting. No facial pressure. He has not been using his nasal steroid consistently, but has been taking Zyrtec at night. Explained he is having allergy flare and we will add Xyzal in am, also reminded him on how to use nasal steroid correctly.    Patient Active Problem List   Diagnosis Date Noted  . Atherosclerosis of abdominal aorta ([place]) 06/17/2015  . Fatty liver disease, nonalcoholic 03/47/4259  . Renal cyst, right 06/17/2015  . BPH (benign prostatic hyperplasia) 05/25/2015  . Microscopic hematuria 05/25/2015  . Second degree hemorrhoids   . Diverticulosis of large intestine without diverticulitis   . Asthma, mild intermittent, well-controlled 10/16/2014  . Allergic rhinitis 10/13/2014  . ED (erectile dysfunction) of organic origin 07/22/2014  . Gastro-esophageal reflux disease without esophagitis 07/22/2014  . Benign hypertension 07/22/2014  . 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[employer]) 07/22/2014  . Adult BMI 30+ 07/22/2014  . Chronic obstructive asthma ([place]) 07/22/2014  . Depression with anxiety 07/22/2014  . Dyslipidemia 07/22/2014  . Genital herpes 07/22/2014  . 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 Osteoarthritis of both knees 07/22/2014  . Obstructive apnea 05/14/2013  . 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 Blood in the urine 09/20/2006    Social History  Substance Use Topics  . Smoking status: Former Smoker    Packs/day: 1.00    Years: 10.00    Types: Cigarettes    Start date: 04/20/1995    Quit date: 04/19/2005  . Smokeless tobacco: Never Used  . Alcohol use 6.0 oz/week    10 Cans of beer per week     Comment: occasional     Current Outpatient Prescriptions:  .  acetaminophen (TYLENOL) 500 MG tablet, Take 1 tablet (500 mg total) by mouth every 6 (six) hours as needed., Disp: 90 tablet, Rfl: 0 .  albuterol (PROVENTIL HFA;VENTOLIN HFA) 108 (90 Base) MCG/ACT inhaler, Inhale 2 puffs into the lungs 4 (four) times daily., Disp: 1 Inhaler, Rfl: 0 .  aspirin 81 MG tablet, Take 81 mg by mouth daily., Disp: , Rfl:  .  atenolol (TENORMIN) 25 MG tablet, Take 1 tablet (25 mg total) by mouth every evening., Disp: 90 tablet, Rfl: 1 .  cetirizine (ZYRTEC) 10 MG tablet, Take 10 mg by mouth daily., Disp: , Rfl:  .  citalopram (CELEXA) 20 MG tablet, Take 1 tablet (20 mg total) by mouth daily., Disp: 90 tablet, Rfl: 1 .  diclofenac sodium (VOLTAREN) 1 % GEL, APPLY 4 GRAMS TOPICALLY FOUR TIMES A DAY, Disp: 100 g, Rfl: 2 .  fluticasone furoate-vilanterol (BREO ELLIPTA) 100-25 MCG/INH AEPB, Inhale 1 puff into the lungs daily., Disp: 60 each, Rfl: 5 .  glucose blood test strip, 1 strip by Other route  2 (two) times daily., Disp: , Rfl:  .  lisinopril (PRINIVIL,ZESTRIL) 40 MG tablet, Take 1 tablet (40 mg total) by mouth daily., Disp: 90 tablet, Rfl: 1 .  metFORMIN (GLUCOPHAGE) 1000 MG tablet, Take 1 tablet (1,000 mg total) by mouth 2 (two) times daily with a meal., Disp: 180 tablet, Rfl: 1 .  Multiple Vitamins-Minerals (MENS MULTIVITAMIN PLUS) TABS, Take by mouth., Disp: , Rfl:  .  Omega-3 Fatty Acids (FISH OIL) 1000 MG CAPS, Take by mouth daily., Disp: , Rfl:  .  rosuvastatin (CRESTOR) 20 MG tablet, Take 1 tablet (20 mg total) by mouth daily., Disp: 90 tablet, Rfl: 1 .  terbinafine (LAMISIL AT) 1 %  cream, Apply 1 application topically 2 (two) times daily., Disp: 30 g, Rfl: 0 .  triamcinolone cream (KENALOG) 0.1 %, Apply 1 application topically 2 (two) times daily., Disp: 30 g, Rfl: 0 .  Diphenhyd-Hydrocort-Nystatin (FIRST-DUKES MOUTHWASH) SUSP, Use as directed 15 mLs in the mouth or throat 4 (four) times daily -  before meals and at bedtime., Disp: 237 mL, Rfl: 0 .  fluticasone (FLONASE) 50 MCG/ACT nasal spray, Place into the nose., Disp: , Rfl:  .  levocetirizine (XYZAL) 5 MG tablet, Take 1 tablet (5 mg total) by mouth every evening., Disp: 14 tablet, Rfl: 0  Allergies  Allergen Reactions  . Lipitor [Atorvastatin] Hives and Itching  . Viagra  [Sildenafil Citrate]     vision loss    ROS  Ten systems reviewed and is negative except as mentioned in HPI   Objective  Vitals:   01/26/17 1446  BP: 132/74  Pulse: (!) 110  Resp: 18  Temp: 99.1 F (37.3 C)  TempSrc: Oral  SpO2: 97%  Weight: (!) 325 lb 4.8 oz (147.6 kg)  Height: 6' 5"  (1.956 m)    Body mass index is 38.57 kg/m.    Physical Exam  Constitutional: Negative for fever or weight change.  Respiratory: Negative for cough and shortness of breath.   Cardiovascular: Negative for chest pain or palpitations.  Gastrointestinal: Negative for abdominal pain, no bowel changes.  Musculoskeletal: Negative for gait problem or joint swelling.  Skin: Negative for rash.  Neurological: Negative for dizziness or headache.  No other specific complaints in a complete review of systems (except as listed in HPI above).  Recent Results (from the past 2160 hour(s))  POCT HgB A1C     Status: Abnormal   Collection Time: 12/08/16  9:08 AM  Result Value Ref Range   Hemoglobin A1C 6.4   COMPLETE METABOLIC PANEL WITH GFR     Status: Abnormal   Collection Time: 12/08/16  9:57 AM  Result Value Ref Range   Sodium 139 135 - 146 mmol/L   Potassium 4.3 3.5 - 5.3 mmol/L   Chloride 103 98 - 110 mmol/L   CO2 23 20 - 32 mmol/L    Comment:  ** Please note change in reference range(s). **      Glucose, Bld 140 (H) 65 - 99 mg/dL   BUN 9 7 - 25 mg/dL   Creat 0.90 0.70 - 1.33 mg/dL    Comment:   For patients > or = 59 years of age: The upper reference limit for Creatinine is approximately 13% higher for people identified as African-American.      Total Bilirubin 0.5 0.2 - 1.2 mg/dL   Alkaline Phosphatase 38 (L) 40 - 115 U/L   AST 28 10 - 35 U/L   ALT 28 9 - 46 U/L  Total Protein 7.0 6.1 - 8.1 g/dL   Albumin 4.4 3.6 - 5.1 g/dL   Calcium 9.6 8.6 - 10.3 mg/dL   GFR, Est African American >89 >=60 mL/min   GFR, Est Non African American >89 >=60 mL/min  Lipid panel     Status: None   Collection Time: 12/08/16  9:57 AM  Result Value Ref Range   Cholesterol 129 <200 mg/dL   Triglycerides 102 <150 mg/dL   HDL 48 >40 mg/dL   Total CHOL/HDL Ratio 2.7 <5.0 Ratio   VLDL 20 <30 mg/dL   LDL Cholesterol 61 <100 mg/dL  CBC with Differential/Platelet     Status: None   Collection Time: 12/08/16  9:57 AM  Result Value Ref Range   WBC 4.9 3.8 - 10.8 K/uL   RBC 4.89 4.20 - 5.80 MIL/uL   Hemoglobin 13.3 13.2 - 17.1 g/dL   HCT 39.9 38.5 - 50.0 %   MCV 81.6 80.0 - 100.0 fL   MCH 27.2 27.0 - 33.0 pg   MCHC 33.3 32.0 - 36.0 g/dL   RDW 14.0 11.0 - 15.0 %   Platelets 219 140 - 400 K/uL   MPV 9.7 7.5 - 12.5 fL   Neutro Abs 2,401 1,500 - 7,800 cells/uL   Lymphs Abs 1,764 850 - 3,900 cells/uL   Monocytes Absolute 637 200 - 950 cells/uL   Eosinophils Absolute 98 15 - 500 cells/uL   Basophils Absolute 0 0 - 200 cells/uL   Neutrophils Relative % 49 %   Lymphocytes Relative 36 %   Monocytes Relative 13 %   Eosinophils Relative 2 %   Basophils Relative 0 %   Smear Review Criteria for review not met      Assessment & Plan  1. Perennial allergic rhinitis with seasonal variation  - levocetirizine (XYZAL) 5 MG tablet; Take 1 tablet (5 mg total) by mouth every evening.  Dispense: 14 tablet; Refill: 0 - Diphenhyd-Hydrocort-Nystatin  (FIRST-DUKES MOUTHWASH) SUSP; Use as directed 15 mLs in the mouth or throat 4 (four) times daily -  before meals and at bedtime.  Dispense: 237 mL; Refill: 0   Fluids, rest, saline nasal solution, use nasal steroid daily, call back if no improvement

## 2017-01-27 ENCOUNTER — Encounter: Payer: Self-pay | Admitting: Family Medicine

## 2017-04-08 ENCOUNTER — Encounter: Payer: Self-pay | Admitting: Family Medicine

## 2017-04-08 ENCOUNTER — Ambulatory Visit: Payer: BC Managed Care – PPO | Admitting: Family Medicine

## 2017-04-08 VITALS — BP 130/80 | HR 79 | Resp 14 | Ht 77.0 in

## 2017-04-08 DIAGNOSIS — I700 Atherosclerosis of aorta: Secondary | ICD-10-CM

## 2017-04-08 DIAGNOSIS — I10 Essential (primary) hypertension: Secondary | ICD-10-CM | POA: Diagnosis not present

## 2017-04-08 DIAGNOSIS — J449 Chronic obstructive pulmonary disease, unspecified: Secondary | ICD-10-CM | POA: Diagnosis not present

## 2017-04-08 DIAGNOSIS — M170 Bilateral primary osteoarthritis of knee: Secondary | ICD-10-CM

## 2017-04-08 DIAGNOSIS — E785 Hyperlipidemia, unspecified: Secondary | ICD-10-CM | POA: Diagnosis not present

## 2017-04-08 DIAGNOSIS — E1129 Type 2 diabetes mellitus with other diabetic kidney complication: Secondary | ICD-10-CM

## 2017-04-08 DIAGNOSIS — F418 Other specified anxiety disorders: Secondary | ICD-10-CM | POA: Diagnosis not present

## 2017-04-08 DIAGNOSIS — E1142 Type 2 diabetes mellitus with diabetic polyneuropathy: Secondary | ICD-10-CM

## 2017-04-08 DIAGNOSIS — R809 Proteinuria, unspecified: Secondary | ICD-10-CM

## 2017-04-08 LAB — POCT GLYCOSYLATED HEMOGLOBIN (HGB A1C): HEMOGLOBIN A1C: 6.7

## 2017-04-08 LAB — POCT UA - MICROALBUMIN
ALBUMIN/CREATININE RATIO, URINE, POC: NEGATIVE
Creatinine, POC: NEGATIVE mg/dL
Microalbumin Ur, POC: NEGATIVE mg/L

## 2017-04-08 NOTE — Progress Notes (Signed)
Name: Isaac Cross   MRN: [id number]    DOB: 12/13/1960   Date:04/08/2017       Progress Note  Subjective  Chief Complaint  Chief Complaint  Patient presents with  . Diabetes  . Hyperlipidemia    HPI  DMII: FSBS being checked occasionally, no hypoglycemia. Taking Metformin 1000 mg twice daily and very seldom has diarrhea. 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  He has been doing well, denies hypoglycemic episodes. He is following a diabetic diet, hgbA1C is at goal last time it was 6.3% , 6.1% , 6.4% and 6.7% today.  He states he eats breakfast and also a larger lunch but skipping dinner, sometimes a hard boiled egg, or small snack at night. He is not interested in starting GLP-1 agonist to try to help with weight loss. He states he has not been as compliant with his diet since Thanksgiving.   HTN: taking medication, bp is at goal now, denies side effects, no palpitation, no chest pain, denies any dizziness  Depression/Anxiety: taking Citalopram, in remission. Denies any symptoms of depression or anxiety at this time. He does not want to stop medication   Hyperlipidemia: last lipid panel was in excellent control, continue Crestor. No myalgia or chest pain  COPD /Asthma: We changed from Advair to [facility] Spring 2017, he is currently using Breo for flares only, not daly.  Denies cough, wheezing or SOB. He smoked one pack daily for 15 years but quit 11 years ago.  OSA: complaint with CPAP, wears all night. Wakes up feeling refreshed and no morning headache, also denies snoring. He denies significant swelling.   History of hematuria: seen by Dr. Karna Dupes, had a repeat CT , he has renal cysts otherwise negative studies. CT showed fatty liver and atherosclerosis of abdominal aorta. He is on high dose statin for atherosclerosis and has been released by Dr.  Karna Dupes  OA: he has noticed daily right knee pain and popping sensation, no instability, he occasionally limps because of pain, and having difficulty going downstairs. Aching pain, no radiation, right now 4/10,pain can go up to 8/10. Triggered by activity and better with rest and Tylenol    Patient Active Problem List   Diagnosis Date Noted  . Atherosclerosis of abdominal aorta ([place]) 06/17/2015  . Fatty liver disease, nonalcoholic 32/35/5732  . Renal cyst, right 06/17/2015  . BPH (benign prostatic hyperplasia) 05/25/2015  . Microscopic hematuria 05/25/2015  . Second degree hemorrhoids   . Diverticulosis of large intestine without diverticulitis   . Asthma, mild intermittent, well-controlled 10/16/2014  . Allergic rhinitis 10/13/2014  . ED (erectile dysfunction) of organic origin 07/22/2014  . Gastro-esophageal reflux disease without esophagitis 07/22/2014  . Benign hypertension 07/22/2014  . 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 Adult BMI 30+ 07/22/2014  . Chronic obstructive asthma ([place]) 07/22/2014  . Depression with anxiety 07/22/2014  . Dyslipidemia 07/22/2014  . Genital herpes 07/22/2014  . 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 Osteoarthritis of both knees 07/22/2014  . Obstructive apnea 05/14/2013  . 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 Blood in the urine 09/20/2006    Past Surgical History:  Procedure Laterality Date  . BLADDER REPAIR    . COLONOSCOPY WITH PROPOFOL N/A 04/07/2015   Procedure: COLONOSCOPY WITH PROPOFOL;  Surgeon: Lucilla Lame, MD;  Location: [facility];  Service: Endoscopy;  Laterality: N/A;  Diabetic - oral meds CPAP  . HAND SURGERY     4TH AND 5TH FINGER  . KNEE ARTHROSCOPY Right   . VEIN SURGERY Right     Family History  Problem Relation Age of Onset  . Hypertension Mother   . Diabetes Mother   . Diabetes Father   . Hypertension Father   . Cancer Father   . Seizures Daughter   . Cancer  Maternal Grandfather        Prostate  . Diabetes Paternal Grandfather   . Bladder Cancer Neg Hx   . Prostate cancer Maternal Uncle   . Kidney cancer Neg Hx     Social History   Socioeconomic History  . Marital status: Married    Spouse name: Not on file  . Number of children: Not on file  . Years of education: Not on file  . Highest education level: Not on file  Social Needs  . Financial resource strain: Not on file  . Food insecurity - worry: Not on file  . Food insecurity - inability: Not on file  . Transportation needs - medical: Not on file  . Transportation needs - non-medical: Not on file  Occupational History  . Not on file  Tobacco Use  . Smoking status: Former Smoker    Packs/day: 1.00    Years: 10.00    Pack years: 10.00    Types: Cigarettes    Start date: 04/20/1995    Last attempt to quit: 04/19/2005    Years since quitting: 11.9  . Smokeless tobacco: Never Used  Substance and Sexual Activity  . Alcohol use: Yes    Alcohol/week: 6.0 oz    Types: 10 Cans of beer per week    Comment: occasional  . Drug use: No  . Sexual activity: Yes    Partners: Female  Other Topics Concern  . Not on file  Social History Narrative  . Not on file     Current Outpatient Medications:  .  acetaminophen (TYLENOL) 500 MG tablet, Take 1 tablet (500 mg total) by mouth every 6 (six) hours as needed., Disp: 90 tablet, Rfl: 0 .  albuterol (PROVENTIL HFA;VENTOLIN HFA) 108 (90 Base) MCG/ACT inhaler, Inhale 2 puffs into the lungs 4 (four) times daily., Disp: 1 Inhaler, Rfl: 0 .  aspirin 81 MG tablet, Take 81 mg by mouth daily., Disp: , Rfl:  .  atenolol (TENORMIN) 25 MG tablet, Take 1 tablet (25 mg total) by mouth every evening., Disp: 90 tablet, Rfl: 1 .  cetirizine (ZYRTEC) 10 MG tablet, Take 10 mg by mouth daily., Disp: , Rfl:  .  citalopram (CELEXA) 20 MG tablet, Take 1 tablet (20 mg total) by mouth daily., Disp: 90 tablet, Rfl: 1 .  diclofenac sodium (VOLTAREN) 1 % GEL, APPLY 4  GRAMS TOPICALLY FOUR TIMES A DAY, Disp: 100 g, Rfl: 2 .  fluticasone (FLONASE) 50 MCG/ACT nasal spray, Place into the nose., Disp: , Rfl:  .  fluticasone furoate-vilanterol (BREO ELLIPTA) 100-25 MCG/INH AEPB, Inhale 1 puff into the lungs daily., Disp: 60 each, Rfl: 5 .  glucose blood test strip, 1 strip by Other route 2 (two) times daily., Disp: , Rfl:  .  lisinopril (PRINIVIL,ZESTRIL) 40 MG tablet, Take 1 tablet (40 mg total) by mouth daily., Disp: 90 tablet, Rfl: 1 .  metFORMIN (GLUCOPHAGE) 1000 MG tablet, Take 1 tablet (1,000 mg total) by mouth 2 (two) times daily with a meal., Disp: 180 tablet, Rfl: 1 .  Multiple Vitamins-Minerals (MENS MULTIVITAMIN PLUS) TABS, Take by mouth., Disp: , Rfl:  .  Omega-3 Fatty Acids (FISH OIL) 1000 MG CAPS, Take by mouth daily., Disp: , Rfl:  .  rosuvastatin (CRESTOR) 20 MG tablet, Take 1 tablet (20 mg total) by mouth daily., Disp: 90 tablet, Rfl: 1 .  terbinafine (LAMISIL AT) 1 % cream, Apply 1 application topically 2 (two) times daily., Disp: 30 g, Rfl: 0 .  triamcinolone cream (KENALOG) 0.1 %, Apply 1 application topically 2 (two) times daily., Disp: 30 g, Rfl: 0  Allergies  Allergen Reactions  . Lipitor [Atorvastatin] Hives and Itching  . Viagra  [Sildenafil Citrate]     vision loss     ROS  Constitutional: Negative for fever , he lost some  weight since last visit  Respiratory: Negative for cough and shortness of breath.   Cardiovascular: Negative for chest pain or palpitations.  Gastrointestinal: Negative for abdominal pain, no bowel changes.  Musculoskeletal: positive  for intermittent gait problem and right knee  joint swelling.  Skin: Negative for rash.  Neurological: Negative for dizziness or headache.  No other specific complaints in a complete review of systems (except as listed in HPI above  Objective  Vitals:   04/08/17 0933  BP: 130/80  Pulse: 79  Resp: 14  SpO2: 98%  Height: 6\' 5"  (1.956 m)    Body mass index is 38.57  kg/m.  Physical Exam  Constitutional: Patient appears well-developed and well-nourished. Obese  No distress.  HEENT: head atraumatic, normocephalic, pupils equal and reactive to light, neck supple, throat within normal limits Cardiovascular: Normal rate, regular rhythm and normal heart sounds.  No murmur heard. Trace  BLE edema. Pulmonary/Chest: Effort normal and breath sounds normal. No respiratory distress. Abdominal: Soft.  There is no tenderness. Psychiatric: Patient has a normal mood and affect. behavior is normal. Judgment and thought content normal.  Recent Results (from the past 2160 hour(s))  POCT HgB A1C     Status: Abnormal   Collection Time: 04/08/17  9:52 AM  Result Value Ref Range   Hemoglobin A1C 6.7       PHQ2/9: Depression screen [facility] 2/9 12/08/2016 07/30/2016 04/29/2016 01/26/2016 10/23/2015  Decreased Interest 0 0 0 0 0  Down, Depressed, Hopeless 0 0 0 0 0  PHQ - 2 Score 0 0 0 0 0     Fall Risk: Fall Risk  04/08/2017 12/08/2016 07/30/2016 04/29/2016 01/26/2016  Falls in the past year? No No No No No     Functional Status Survey: Is the patient deaf or have difficulty hearing?: No Does the patient have difficulty seeing, even when wearing glasses/contacts?: No Does the patient have difficulty concentrating, remembering, or making decisions?: No Does the patient have difficulty walking or climbing stairs?: No Does the patient have difficulty dressing or bathing?: No Does the patient have difficulty doing errands alone such as visiting a doctor's office or shopping?: No    Assessment & Plan  1. Type 2 diabetes mellitus with microalbuminuria, without long-term current use of insulin (HCC)  - POCT HgB A1C  2. Diabetic polyneuropathy associated with type 2 diabetes mellitus ([place])  At goal, but trending up, he will continue to follow diabetic diet   3. Dyslipidemia  Continue statin therapy   4. COPD with asthma ([place])  Stable on prn use   5. Depression  with anxiety  In remission  6. Primary osteoarthritis of both knees  stable  7. Benign hypertension  Controlled with medication  8. Atherosclerosis of aorta (HCC)  Continue aspirin, statin therapy and good bp control

## 2017-07-13 ENCOUNTER — Encounter: Payer: Self-pay | Admitting: Family Medicine

## 2017-07-13 ENCOUNTER — Ambulatory Visit (INDEPENDENT_AMBULATORY_CARE_PROVIDER_SITE_OTHER): Payer: BC Managed Care – PPO | Admitting: Family Medicine

## 2017-07-13 VITALS — BP 128/70 | HR 118 | Temp 98.2°F | Resp 14 | Ht 75.0 in | Wt 316.6 lb

## 2017-07-13 DIAGNOSIS — E1142 Type 2 diabetes mellitus with diabetic polyneuropathy: Secondary | ICD-10-CM

## 2017-07-13 DIAGNOSIS — Z0001 Encounter for general adult medical examination with abnormal findings: Secondary | ICD-10-CM

## 2017-07-13 DIAGNOSIS — E785 Hyperlipidemia, unspecified: Secondary | ICD-10-CM

## 2017-07-13 DIAGNOSIS — F418 Other specified anxiety disorders: Secondary | ICD-10-CM

## 2017-07-13 DIAGNOSIS — M170 Bilateral primary osteoarthritis of knee: Secondary | ICD-10-CM | POA: Diagnosis not present

## 2017-07-13 DIAGNOSIS — Z87891 Personal history of nicotine dependence: Secondary | ICD-10-CM | POA: Diagnosis not present

## 2017-07-13 DIAGNOSIS — Z125 Encounter for screening for malignant neoplasm of prostate: Secondary | ICD-10-CM

## 2017-07-13 DIAGNOSIS — R9431 Abnormal electrocardiogram [ECG] [EKG]: Secondary | ICD-10-CM

## 2017-07-13 DIAGNOSIS — J449 Chronic obstructive pulmonary disease, unspecified: Secondary | ICD-10-CM | POA: Diagnosis not present

## 2017-07-13 DIAGNOSIS — R809 Proteinuria, unspecified: Secondary | ICD-10-CM

## 2017-07-13 DIAGNOSIS — I10 Essential (primary) hypertension: Secondary | ICD-10-CM

## 2017-07-13 DIAGNOSIS — E1129 Type 2 diabetes mellitus with other diabetic kidney complication: Secondary | ICD-10-CM | POA: Diagnosis not present

## 2017-07-13 DIAGNOSIS — I700 Atherosclerosis of aorta: Secondary | ICD-10-CM | POA: Diagnosis not present

## 2017-07-13 DIAGNOSIS — Z0000 Encounter for general adult medical examination without abnormal findings: Secondary | ICD-10-CM

## 2017-07-13 DIAGNOSIS — M2351 Chronic instability of knee, right knee: Secondary | ICD-10-CM | POA: Diagnosis not present

## 2017-07-13 MED ORDER — CITALOPRAM HYDROBROMIDE 20 MG PO TABS: 20.0000 mg | ORAL_TABLET | Freq: Every day | ORAL | 1 refills | Status: DC

## 2017-07-13 MED ORDER — FLUTICASONE FUROATE-VILANTEROL 100-25 MCG/INH IN AEPB: 1.0000 | INHALATION_SPRAY | Freq: Every day | RESPIRATORY_TRACT | 5 refills | Status: DC

## 2017-07-13 MED ORDER — ROSUVASTATIN CALCIUM 20 MG PO TABS: 20.0000 mg | ORAL_TABLET | Freq: Every day | ORAL | 1 refills | Status: DC

## 2017-07-13 MED ORDER — METFORMIN HCL 1000 MG PO TABS: 1000.0000 mg | ORAL_TABLET | Freq: Two times a day (BID) | ORAL | 1 refills | Status: DC

## 2017-07-13 MED ORDER — ATENOLOL 25 MG PO TABS: 25.0000 mg | ORAL_TABLET | Freq: Every evening | ORAL | 1 refills | Status: DC

## 2017-07-13 MED ORDER — LISINOPRIL 40 MG PO TABS: 40.0000 mg | ORAL_TABLET | Freq: Every day | ORAL | 1 refills | Status: DC

## 2017-07-13 MED ORDER — DICLOFENAC SODIUM 1 % TD GEL: TRANSDERMAL | 2 refills | Status: DC

## 2017-07-13 NOTE — Addendum Note (Signed)
Addended by: Steele Sizer F on: 07/13/2017 03:02 PM   Modules accepted: Orders

## 2017-07-13 NOTE — Progress Notes (Addendum)
Name: Isaac Cross   MRN: [id number]    DOB: 02-02-1961   Date:07/13/2017       Progress Note  Subjective  Chief Complaint  Chief Complaint  Patient presents with  . Annual Exam  . Follow-up    HPI  Patient presents for annual CPE follow up  DMII: FSBS being checked occasionally, no hypoglycemia. Taking Metformin 1000 mg twice daily, he states diarrhea is less frequent than it used to be. 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 He is following a diabetic diet, more fruit and vegetables since last visit.  hgbA1C 6.3% ,6.1% , 6.4% and 6.7% Dec 2018 we will recheck at labs today.He states he eats breakfast and also a larger lunch but skipping dinner. Eye exam is up to date, foot exam is due today   HTN: taking medication, bp is at goal now, no palpitation, no chest pain, denies any dizziness. Denies orthopnea  Depression/Anxiety: taking Citalopram, in remission. Denies any symptoms of depression or anxiety at this time. He does not want to stop medication, afraid of having recurrence of symptoms    Hyperlipidemia: last lipid panel was in excellent control, continue Crestor. Denies side effects of medication   COPD /Asthma: We changed from Advair to [facility] Spring 2017, he is currently using Breo daily because it helps with allergy symptoms also.  Denies cough, wheezing or SOB. He smoked one pack daily for 15 years but quit 12 years ago, we will refer him for lung cancer screening .  OSA: complaint with CPAP, wears all night. Wakes up feeling refreshed and no morning headache, also denies snoring when using CPAP .He has mild swelling on legs, but stable  History of hematuria: seen by Dr. Karna Dupes, had a repeat CT , he has renal cysts otherwise negative studies. CT  showed fatty liver and atherosclerosis of abdominal aorta. He is on high dose statin for atherosclerosis and has been released by Dr. Karna Dupes. He is on statin for atherosclerosis aorta  OA both knees: right knee worse than left, daily pain. Pain is described as dull ache 6/10, and also having instability when standing for a long period of time and has been using a cane prn. Still taking Tylenol, willing to see Ortho now   USPSTF grade A and B recommendations:  Depression:  Depression screen [facility] 2/9 07/13/2017 12/08/2016 07/30/2016 04/29/2016 01/26/2016  Decreased Interest 0 0 0 0 0  Down, Depressed, Hopeless 0 0 0 0 0  PHQ - 2 Score 0 0 0 0 0    Hypertension:  BP Readings from Last 3 Encounters:  07/13/17 128/70  04/08/17 130/80  01/26/17 132/74    Obesity: Wt Readings from Last 3 Encounters:  07/13/17 (!) 316 lb 9.6 oz (143.6 kg)  01/26/17 (!) 325 lb 4.8 oz (147.6 kg)  12/08/16 (!) 321 lb 3 oz (145.7 kg)   BMI Readings from Last 3 Encounters:  07/13/17 39.57 kg/m  04/08/17 38.57 kg/m  01/26/17 38.57 kg/m     Lipids:  Lab Results  Component Value Date   CHOL 129 12/08/2016   CHOL 124 (L) 01/26/2016   CHOL 127 06/17/2015   Lab Results  Component Value Date   HDL 48 12/08/2016   HDL 55 01/26/2016   HDL 40 06/17/2015  Lab Results  Component Value Date   LDLCALC 61 12/08/2016   LDLCALC 56 01/26/2016   LDLCALC 48 06/17/2015   Lab Results  Component Value Date   TRIG 102 12/08/2016   TRIG 64 01/26/2016   TRIG 194 (H) 06/17/2015   Lab Results  Component Value Date   CHOLHDL 2.7 12/08/2016   CHOLHDL 2.3 01/26/2016   CHOLHDL 3.2 06/17/2015   No results found for: LDLDIRECT Glucose:  Glucose  Date Value Ref Range Status  06/17/2015 125 (H) 65 - 99 mg/dL Final   Glucose, Bld  Date Value Ref Range Status  12/08/2016 140 (H) 65 - 99 mg/dL Final  01/26/2016 105 (H) 65 - 99 mg/dL Final   Glucose-Capillary  Date Value Ref Range Status  04/07/2015 105 (H) 65  - 99 mg/dL Final  04/07/2015 104 (H) 65 - 99 mg/dL Final     Married STD testing and prevention (chl/gon/syphilis):  HIV, hep C: N/A Colorectal cancer: 2016  Prostate cancer: USPT   Lab Results  Component Value Date   PSA 0.7 01/10/2012   PSA  09/19/2010    ========== TEST NAME ==========  ========= RESULTS =========  = REFERENCE RANGE =  PROSTATE-SPECIFIC AG.  PSA, Serum (Serial Monitor) Prostate Specific Ag, Serum     [   0.6 ng/mL            ]        Not Estab. Roche ECLIA methodology.                                                                      . According to the American Urological Association, Serum PSA should decrease and remain at undetectable levels after radical prostatectomy. The AUA defines biochemical recurrence as an initial PSA value 0.2 ng/mL or greater followed by a subsequent confirmatory PSA value 0.2 ng/mL or greater. Values obtained with different assay methods or kits cannot be used interchangeably. Results cannot be interpreted as absolute evidence of the presence or absence of malignant disease.               [facility]            No: [id number]           [address] [place], [place] [address]           Lindon Romp, MD         [phone]   Result(s) reported on 21 Sep 2010 at 11:19PM.     IPSS Questionnaire (AUA-7): Over the past month.   1)  How often have you had a sensation of not emptying your bladder completely after you finish urinating?  1 - Less than 1 time in 5  2)  How often have you had to urinate again less than two hours after you finished urinating? 1 - Less than 1 time in 5  3)  How often have you found you stopped and started again several times when you urinated?  3 - About half the time  4) How difficult have you found it to postpone urination?  0 - Not at all  5) How often have you had a weak urinary stream?  0 - Not at all  6) How often have  you had to push or strain to begin urination?  0 -  Not at all  7) How many times did you most typically get up to urinate from the time you went to bed until the time you got up in the morning?  1 - 1 time  Total score:  0-7 mildly symptomatic   8-19 moderately symptomatic   20-35 severely symptomatic   Lung cancer:  Low Dose CT Chest recommended if Age 7-80 years, 30 pack-year currently smoking OR have quit w/in 15years. Patient does qualify.    Aspirin: taking it  ECG:  Today   Advanced Care Planning: A voluntary discussion about advance care planning including the explanation and discussion of advance directives.  Discussed health care proxy and Living will, and the patient was able to identify a health care proxy as wife    Patient does not have a living will at present time.    Patient Active Problem List   Diagnosis Date Noted  . Atherosclerosis of abdominal aorta ([place]) 06/17/2015  . Fatty liver disease, nonalcoholic 69/67/8938  . Renal cyst, right 06/17/2015  . BPH (benign prostatic hyperplasia) 05/25/2015  . Microscopic hematuria 05/25/2015  . Second degree hemorrhoids   . Diverticulosis of large intestine without diverticulitis   . Asthma, mild intermittent, well-controlled 10/16/2014  . Allergic rhinitis 10/13/2014  . ED (erectile dysfunction) of organic origin 07/22/2014  . Gastro-esophageal reflux disease without esophagitis 07/22/2014  . Benign hypertension 07/22/2014  . 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[place]) 07/22/2014  . Adult BMI 30+ 07/22/2014  . Chronic obstructive asthma ([place]) 07/22/2014  . Depression with anxiety 07/22/2014  . Dyslipidemia 07/22/2014  . Genital herpes 07/22/2014  . 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 Osteoarthritis of both knees 07/22/2014  . Obstructive apnea 05/14/2013  . 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 Blood in the urine 09/20/2006    Past Surgical History:  Procedure Laterality Date  . BLADDER REPAIR    . COLONOSCOPY WITH PROPOFOL N/A 04/07/2015    Procedure: COLONOSCOPY WITH PROPOFOL;  Surgeon: Lucilla Lame, MD;  Location: [place];  Service: Endoscopy;  Laterality: N/A;  Diabetic - oral meds CPAP  . HAND SURGERY     4TH AND 5TH FINGER  . KNEE ARTHROSCOPY Right   . VEIN SURGERY Right     Family History  Problem Relation Age of Onset  . Hypertension Mother   . Diabetes Mother   . Diabetes Father   . Hypertension Father   . Cancer Father   . Seizures Daughter   . Lupus Daughter   . Cancer Maternal Grandfather        Prostate  . Diabetes Paternal Grandfather   . Prostate cancer Maternal Uncle   . Bladder Cancer Neg Hx   . Kidney cancer Neg Hx     Social History   Socioeconomic History  . Marital status: Married    Spouse name: Not on file  . Number of children: 2  . Years of education: Not on file  . Highest education level: Bachelor's degree (e.g., BA, AB, BS)  Occupational History  . Occupation: case Freight forwarder   Social Needs  . Financial resource strain: Not hard at all  . Food insecurity:    Worry: Never true    Inability: Never true  . Transportation needs:    Medical: No    Non-medical: No  Tobacco Use  . Smoking status: Former Smoker    Packs/day: 1.00    Years: 10.00  Pack years: 10.00    Types: Cigarettes    Start date: 04/20/1995    Last attempt to quit: 04/19/2005    Years since quitting: 12.2  . Smokeless tobacco: Never Used  Substance and Sexual Activity  . Alcohol use: Yes    Alcohol/week: 6.0 oz    Types: 10 Cans of beer per week    Comment: occasional  . Drug use: No  . Sexual activity: Yes    Partners: Female  Lifestyle  . Physical activity:    Days per week: 0 days    Minutes per session: 0 min  . Stress: Not at all  Relationships  . Social connections:    Talks on phone: More than three times a week    Gets together: Once a week    Attends religious service: Never    Active member of club or organization: No    Attends meetings of clubs or organizations: Never     Relationship status: Married  . Intimate partner violence:    Fear of current or ex partner: No    Emotionally abused: No    Physically abused: No    Forced sexual activity: No  Other Topics Concern  . Not on file  Social History Narrative  . Not on file     Current Outpatient Medications:  .  acetaminophen (TYLENOL) 500 MG tablet, Take 1 tablet (500 mg total) by mouth every 6 (six) hours as needed., Disp: 90 tablet, Rfl: 0 .  albuterol (PROVENTIL HFA;VENTOLIN HFA) 108 (90 Base) MCG/ACT inhaler, Inhale 2 puffs into the lungs 4 (four) times daily., Disp: 1 Inhaler, Rfl: 0 .  aspirin 81 MG tablet, Take 81 mg by mouth daily., Disp: , Rfl:  .  atenolol (TENORMIN) 25 MG tablet, Take 1 tablet (25 mg total) by mouth every evening., Disp: 90 tablet, Rfl: 1 .  cetirizine (ZYRTEC) 10 MG tablet, Take 10 mg by mouth daily., Disp: , Rfl:  .  citalopram (CELEXA) 20 MG tablet, Take 1 tablet (20 mg total) by mouth daily., Disp: 90 tablet, Rfl: 1 .  diclofenac sodium (VOLTAREN) 1 % GEL, APPLY 4 GRAMS TOPICALLY FOUR TIMES A DAY, Disp: 100 g, Rfl: 2 .  fluticasone furoate-vilanterol (BREO ELLIPTA) 100-25 MCG/INH AEPB, Inhale 1 puff into the lungs daily., Disp: 60 each, Rfl: 5 .  glucose blood test strip, 1 strip by Other route 2 (two) times daily., Disp: , Rfl:  .  lisinopril (PRINIVIL,ZESTRIL) 40 MG tablet, Take 1 tablet (40 mg total) by mouth daily., Disp: 90 tablet, Rfl: 1 .  metFORMIN (GLUCOPHAGE) 1000 MG tablet, Take 1 tablet (1,000 mg total) by mouth 2 (two) times daily with a meal., Disp: 180 tablet, Rfl: 1 .  Multiple Vitamins-Minerals (MENS MULTIVITAMIN PLUS) TABS, Take by mouth., Disp: , Rfl:  .  Omega-3 Fatty Acids (FISH OIL) 1000 MG CAPS, Take by mouth daily., Disp: , Rfl:  .  rosuvastatin (CRESTOR) 20 MG tablet, Take 1 tablet (20 mg total) by mouth daily., Disp: 90 tablet, Rfl: 1  Allergies  Allergen Reactions  . Lipitor [Atorvastatin] Hives and Itching  . Viagra  [Sildenafil Citrate]      vision loss     ROS   Constitutional: Negative for fever , positive for mild weight change.  Respiratory: Negative for cough and shortness of breath.   Cardiovascular: Negative for chest pain or palpitations.  Gastrointestinal: Negative for abdominal pain, no bowel changes.  Musculoskeletal: Positive  for gait problem - has to use  cane occasionally and has intermittent right knee swelling.  Skin: Negative for rash.  Neurological: Negative for dizziness or headache.  No other specific complaints in a complete review of systems (except as listed in HPI above).   Objective  Vitals:   07/13/17 1023  BP: 128/70  Pulse: (!) 118  Resp: 14  Temp: 98.2 F (36.8 C)  TempSrc: Oral  SpO2: 97%  Weight: (!) 316 lb 9.6 oz (143.6 kg)  Height: 6\' 3"  (1.905 m)    Body mass index is 39.57 kg/m.  Physical Exam  Constitutional: Patient appears well-developed and very tall and obese. No distress.  HENT: Head: Normocephalic and atraumatic. Ears: B TMs ok, no erythema or effusion; Nose: Nose normal. Mouth/Throat: Oropharynx is clear and moist. No oropharyngeal exudate.  Eyes: Conjunctivae and EOM are normal. Pupils are equal, round, and reactive to light. No scleral icterus.  Neck: Normal range of motion. Neck supple. No JVD present. No thyromegaly present.  Cardiovascular: Normal rate, regular rhythm and normal heart sounds.  No murmur heard. No BLE edema. Pulmonary/Chest: Effort normal and breath sounds normal. No respiratory distress. Abdominal: Soft. Bowel sounds are normal, no distension. There is no tenderness. no masses MALE GENITALIA: Normal descended testes bilaterally, no masses palpated, no hernias, no lesions, no discharge RECTAL: Prostate normal size and consistency, but only partially palpable, no rectal masses or hemorrhoids Musculoskeletal: Normal range of motion, no joint effusions. No gross deformities Neurological: he is alert and oriented to person, place, and time. No  cranial nerve deficit. Coordination, balance, strength, speech and gait are normal.  Skin: Skin is warm and dry. No rash noted. No erythema.  Psychiatric: Patient has a normal mood and affect. behavior is normal. Judgment and thought content normal.   Diabetic Foot Exam: Diabetic Foot Exam - Simple   Simple Foot Form Diabetic Foot exam was performed with the following findings:  Yes 07/13/2017  2:58 PM  Visual Inspection No deformities, no ulcerations, no other skin breakdown bilaterally:  Yes Sensation Testing Intact to touch and monofilament testing bilaterally:  Yes Pulse Check Posterior Tibialis and Dorsalis pulse intact bilaterally:  Yes Comments Some varicose      PHQ2/9: Depression screen [facility] 2/9 07/13/2017 12/08/2016 07/30/2016 04/29/2016 01/26/2016  Decreased Interest 0 0 0 0 0  Down, Depressed, Hopeless 0 0 0 0 0  PHQ - 2 Score 0 0 0 0 0     Fall Risk: Fall Risk  07/13/2017 04/08/2017 12/08/2016 07/30/2016 04/29/2016  Falls in the past year? No No No No No     Functional Status Survey: Is the patient deaf or have difficulty hearing?: No Does the patient have difficulty seeing, even when wearing glasses/contacts?: No Does the patient have difficulty concentrating, remembering, or making decisions?: No Does the patient have difficulty walking or climbing stairs?: Yes Does the patient have difficulty dressing or bathing?: No Does the patient have difficulty doing errands alone such as visiting a doctor's office or shopping?: No    Assessment & Plan  1. Encounter for routine history and physical exam for male  Discussed importance of 150 minutes of physical activity weekly, eat two servings of fish weekly, eat one serving of tree nuts ( cashews, pistachios, pecans, almonds.Marland Kitchen) every other day, eat 6 servings of fruit/vegetables daily and drink plenty of water and avoid sweet beverages.   2. Benign hypertension  - COMPLETE METABOLIC PANEL WITH GFR - atenolol (TENORMIN)  25 MG tablet; Take 1 tablet (25 mg total) by mouth every  evening.  Dispense: 90 tablet; Refill: 1 - lisinopril (PRINIVIL,ZESTRIL) 40 MG tablet; Take 1 tablet (40 mg total) by mouth daily.  Dispense: 90 tablet; Refill: 1 - EKG 12-Lead  3. Depression with anxiety  - citalopram (CELEXA) 20 MG tablet; Take 1 tablet (20 mg total) by mouth daily.  Dispense: 90 tablet; Refill: 1  4. Primary osteoarthritis of both knees  - diclofenac sodium (VOLTAREN) 1 % GEL; APPLY 4 GRAMS TOPICALLY FOUR TIMES A DAY  Dispense: 100 g; Refill: 2 - Ambulatory referral to Orthopedic Surgery  5. COPD with asthma ([place])  - fluticasone furoate-vilanterol (BREO ELLIPTA) 100-25 MCG/INH AEPB; Inhale 1 puff into the lungs daily.  Dispense: 60 each; Refill: 5  6. Type 2 diabetes mellitus with microalbuminuria, without long-term current use of insulin (HCC)  - Hemoglobin A1c - lisinopril (PRINIVIL,ZESTRIL) 40 MG tablet; Take 1 tablet (40 mg total) by mouth daily.  Dispense: 90 tablet; Refill: 1 - metFORMIN (GLUCOPHAGE) 1000 MG tablet; Take 1 tablet (1,000 mg total) by mouth 2 (two) times daily with a meal.  Dispense: 180 tablet; Refill: 1  7. Diabetic polyneuropathy associated with type 2 diabetes mellitus (HCC)  - Hemoglobin A1c - metFORMIN (GLUCOPHAGE) 1000 MG tablet; Take 1 tablet (1,000 mg total) by mouth 2 (two) times daily with a meal.  Dispense: 180 tablet; Refill: 1  8. Dyslipidemia  - Lipid panel - rosuvastatin (CRESTOR) 20 MG tablet; Take 1 tablet (20 mg total) by mouth daily.  Dispense: 90 tablet; Refill: 1  9. Atherosclerosis of aorta (HCC)  Continue statin therapy   10. Prostate cancer screening  - PSA  12. History of tobacco use  - CT CHEST LUNG CA SCREEN LOW DOSE W/O CM; Future  13. Abnormal EKG  - Ambulatory referral to Cardiology

## 2017-07-13 NOTE — Patient Instructions (Signed)

## 2017-07-14 LAB — COMPLETE METABOLIC PANEL WITH GFR
AG Ratio: 1.7 (calc) (ref 1.0–2.5)
ALT: 28 U/L (ref 9–46)
AST: 30 U/L (ref 10–35)
Albumin: 4.5 g/dL (ref 3.6–5.1)
Alkaline phosphatase (APISO): 38 U/L — ABNORMAL LOW (ref 40–115)
BUN: 10 mg/dL (ref 7–25)
CHLORIDE: 103 mmol/L (ref 98–110)
CO2: 28 mmol/L (ref 20–32)
CREATININE: 0.97 mg/dL (ref 0.70–1.33)
Calcium: 9.5 mg/dL (ref 8.6–10.3)
GFR, Est African American: 99 mL/min/{1.73_m2} (ref >=60)
GFR, Est Non African American: 85 mL/min/{1.73_m2} (ref >=60)
GLUCOSE: 98 mg/dL (ref 65–139)
Globulin: 2.7 g/dL (calc) (ref 1.9–3.7)
Potassium: 4.3 mmol/L (ref 3.5–5.3)
Sodium: 139 mmol/L (ref 135–146)
Total Bilirubin: 0.5 mg/dL (ref 0.2–1.2)
Total Protein: 7.2 g/dL (ref 6.1–8.1)

## 2017-07-14 LAB — HEMOGLOBIN A1C
EAG (MMOL/L): 7.4 (calc)
Hgb A1c MFr Bld: 6.3 % of total Hgb — ABNORMAL HIGH (ref <5.7)
MEAN PLASMA GLUCOSE: 134 (calc)

## 2017-07-14 LAB — LIPID PANEL
CHOL/HDL RATIO: 3 (calc) (ref <5.0)
CHOLESTEROL: 127 mg/dL (ref <200)
HDL: 42 mg/dL (ref >40)
LDL CHOLESTEROL (CALC): 64 mg/dL
Non-HDL Cholesterol (Calc): 85 mg/dL (calc) (ref <130)
Triglycerides: 128 mg/dL (ref <150)

## 2017-07-14 LAB — PSA: PSA: 0.7 ng/mL (ref <=4.0)

## 2017-07-17 ENCOUNTER — Encounter: Payer: Self-pay | Admitting: Emergency Medicine

## 2017-07-17 ENCOUNTER — Emergency Department: Payer: BC Managed Care – PPO

## 2017-07-17 ENCOUNTER — Emergency Department
Admission: EM | Admit: 2017-07-17 | Discharge: 2017-07-17 | Disposition: A | Discharge status: Home / Self Care | Payer: BC Managed Care – PPO | Attending: Emergency Medicine | Admitting: Emergency Medicine

## 2017-07-17 ENCOUNTER — Other Ambulatory Visit: Payer: Self-pay

## 2017-07-17 DIAGNOSIS — Y999 Unspecified external cause status: Secondary | ICD-10-CM | POA: Insufficient documentation

## 2017-07-17 DIAGNOSIS — Y939 Activity, unspecified: Secondary | ICD-10-CM | POA: Diagnosis not present

## 2017-07-17 DIAGNOSIS — Z7984 Long term (current) use of oral hypoglycemic drugs: Secondary | ICD-10-CM | POA: Diagnosis not present

## 2017-07-17 DIAGNOSIS — Y929 Unspecified place or not applicable: Secondary | ICD-10-CM | POA: Diagnosis not present

## 2017-07-17 DIAGNOSIS — E1129 Type 2 diabetes mellitus with other diabetic kidney complication: Secondary | ICD-10-CM | POA: Insufficient documentation

## 2017-07-17 DIAGNOSIS — W293XXA Contact with powered garden and outdoor hand tools and machinery, initial encounter: Secondary | ICD-10-CM | POA: Insufficient documentation

## 2017-07-17 DIAGNOSIS — Z79899 Other long term (current) drug therapy: Secondary | ICD-10-CM | POA: Insufficient documentation

## 2017-07-17 DIAGNOSIS — Z7982 Long term (current) use of aspirin: Secondary | ICD-10-CM | POA: Insufficient documentation

## 2017-07-17 DIAGNOSIS — S61211A Laceration without foreign body of left index finger without damage to nail, initial encounter: Secondary | ICD-10-CM | POA: Diagnosis not present

## 2017-07-17 DIAGNOSIS — J4520 Mild intermittent asthma, uncomplicated: Secondary | ICD-10-CM | POA: Insufficient documentation

## 2017-07-17 DIAGNOSIS — Z87891 Personal history of nicotine dependence: Secondary | ICD-10-CM | POA: Diagnosis not present

## 2017-07-17 DIAGNOSIS — S6992XA Unspecified injury of left wrist, hand and finger(s), initial encounter: Secondary | ICD-10-CM | POA: Diagnosis present

## 2017-07-17 IMAGING — DX DG FINGER INDEX 2+V*L*
3 series · 3 of 3 positions shown · non-contrast
Comparison: None.

CLINICAL DATA: Acute injury and laceration to index finger. Initial
encounter.

EXAM:
LEFT INDEX FINGER 2+V

[finger ap]
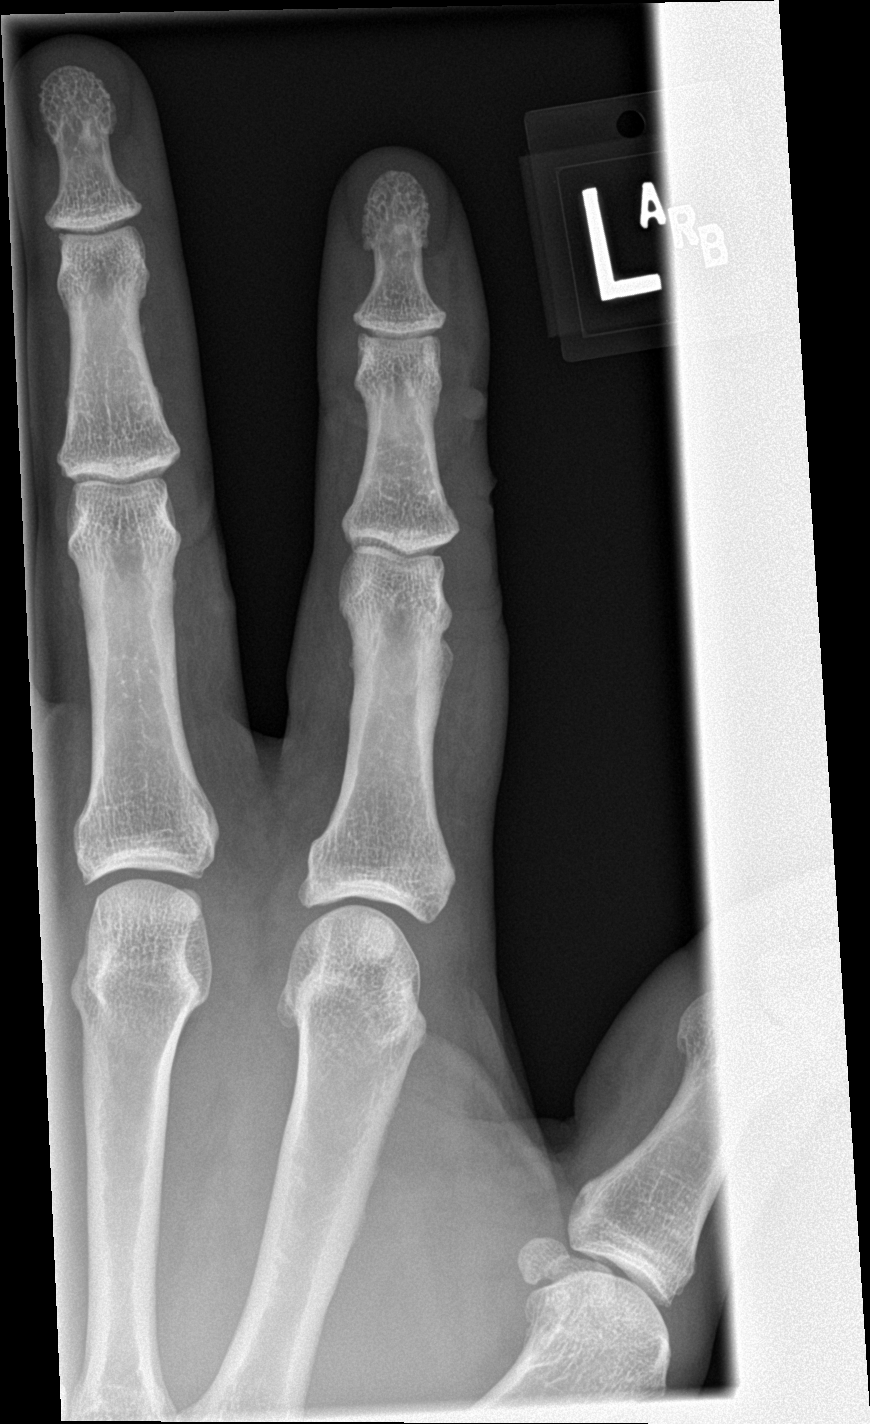

[finger obl]
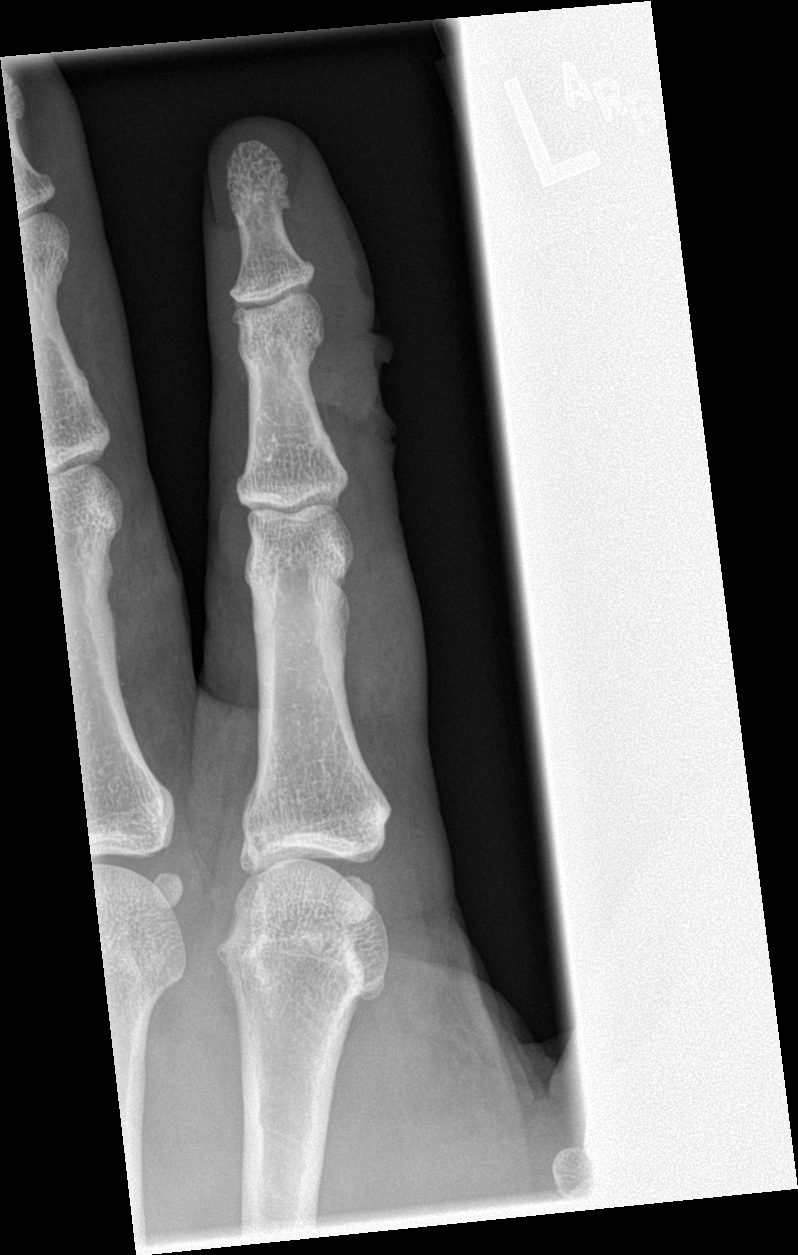

[finger lat]
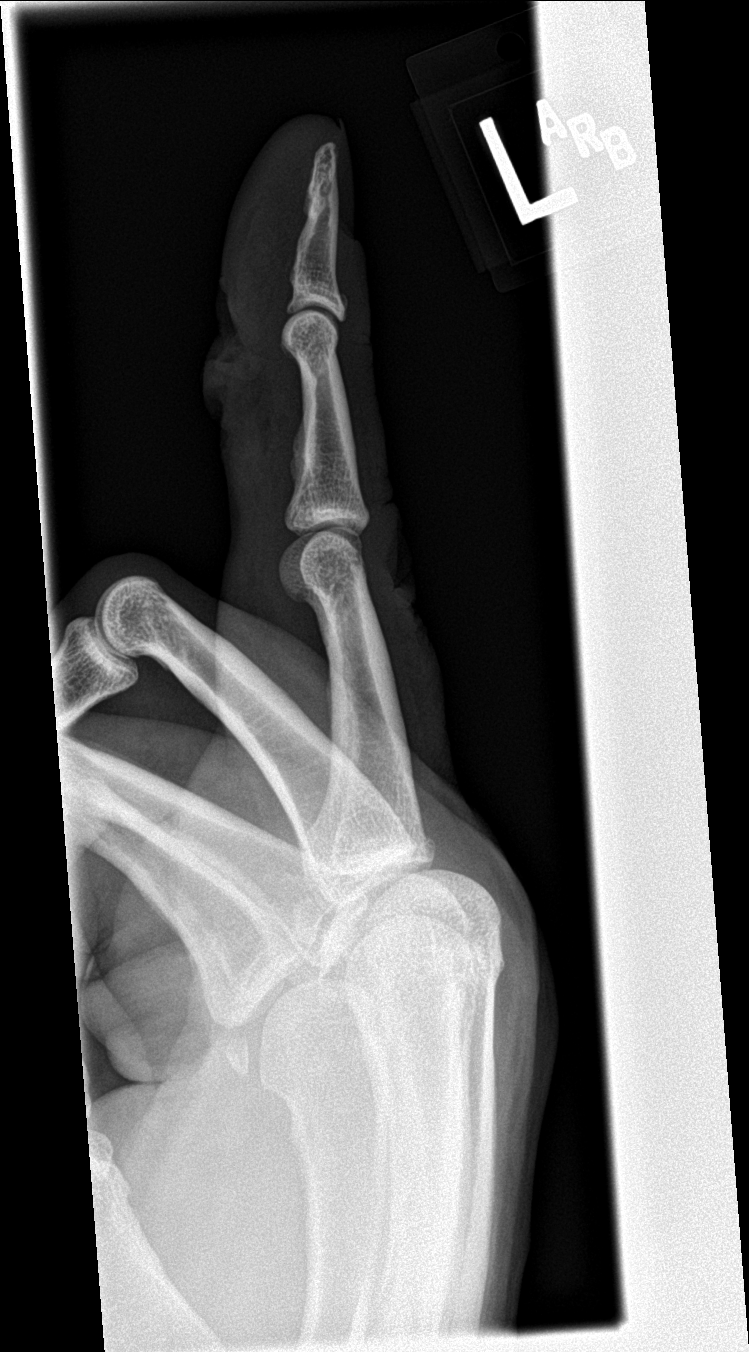

[3 of 3 positions shown; findings below may reference images not displayed]

FINDINGS: There is no evidence of acute fracture, subluxation or dislocation.

No radiopaque foreign bodies noted.

Soft tissue irregularity overlying the PIP joint noted.
IMPRESSION: Soft tissue injury without acute bony abnormality or radiopaque
foreign body.

## 2017-07-17 MED ORDER — CEPHALEXIN 500 MG PO CAPS: 500.0000 mg | ORAL_CAPSULE | Freq: Four times a day (QID) | ORAL | 0 refills | Status: AC

## 2017-07-17 MED ORDER — OXYCODONE-ACETAMINOPHEN 5-325 MG PO TABS: 1.0000 | ORAL_TABLET | ORAL | 0 refills | Status: DC | PRN

## 2017-07-17 MED ORDER — LIDOCAINE HCL (PF) 1 % IJ SOLN
INTRAMUSCULAR | Status: AC
  Administered 2017-07-17: 5 mL via INTRADERMAL
  Filled 2017-07-17: qty 5

## 2017-07-17 MED ORDER — LIDOCAINE HCL (PF) 1 % IJ SOLN
5.0000 mL | Freq: Once | INTRAMUSCULAR | Status: AC
  Administered 2017-07-17: 5 mL via INTRADERMAL
  Filled 2017-07-17: qty 5

## 2017-07-17 NOTE — ED Provider Notes (Signed)
[facility] Emergency Department Provider Note  ____________________________________________  Time seen: Approximately 1:45 PM  I have reviewed the triage vital signs and the nursing notes.   HISTORY  Chief Complaint Laceration    HPI Isaac Cross is a 57 y.o. male that presents to the emergency department for evaluation of left finger laceration from chainsaw.  Patient is able to bend his finger without difficulty. Tetanus shot was within the last year. No additional injuries.   Past Medical History:  Diagnosis Date  . Allergy   . Anxiety   . Arthritis    knees  . Asthma, mild intermittent, well-controlled   . Chronic obstructive asthma ([place])   . COPD (chronic obstructive pulmonary disease) ([place])   . Diabetes ([place])   . GERD (gastroesophageal reflux disease)   . HBP (high blood pressure)   . High cholesterol   . Renal cyst, right   . Sleep apnea    CPAP  . Swelling     Patient Active Problem List   Diagnosis Date Noted  . Atherosclerosis of abdominal aorta ([place]) 06/17/2015  . Fatty liver disease, nonalcoholic 92/33/0076  . Renal cyst, right 06/17/2015  . BPH (benign prostatic hyperplasia) 05/25/2015  . Microscopic hematuria 05/25/2015  . Second degree hemorrhoids   . Diverticulosis of large intestine without diverticulitis   . Asthma, mild intermittent, well-controlled 10/16/2014  . Allergic rhinitis 10/13/2014  . ED (erectile dysfunction) of organic origin 07/22/2014  . Gastro-esophageal reflux disease without esophagitis 07/22/2014  . Benign hypertension 07/22/2014  . 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 Adult BMI 30+ 07/22/2014  . Chronic obstructive asthma ([place]) 07/22/2014  . Depression with anxiety 07/22/2014  . Dyslipidemia 07/22/2014  . Genital herpes 07/22/2014  . 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 Osteoarthritis of both knees 07/22/2014  . Obstructive apnea 05/14/2013  . 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 Blood in the urine 09/20/2006    Past Surgical History:  Procedure Laterality Date  . BLADDER REPAIR    . COLONOSCOPY WITH PROPOFOL N/A 04/07/2015   Procedure: COLONOSCOPY WITH PROPOFOL;  Surgeon: Lucilla Lame, MD;  Location: [place];  Service: Endoscopy;  Laterality: N/A;  Diabetic - oral meds CPAP  . HAND SURGERY     4TH AND 5TH FINGER  . KNEE ARTHROSCOPY Right   . VEIN SURGERY Right     Prior to Admission medications   Medication Sig Start Date End Date Taking? Authorizing Provider  acetaminophen (TYLENOL) 500 MG tablet Take 1 tablet (500 mg total) by mouth every 6 (six) hours as needed. 12/08/16   Steele Sizer, MD  albuterol (PROVENTIL HFA;VENTOLIN HFA) 108 (90 Base) MCG/ACT inhaler Inhale 2 puffs into the lungs 4 (four) times daily. 12/08/16   Steele Sizer, MD  aspirin 81 MG tablet Take 81 mg by mouth daily.    [provider]  atenolol (TENORMIN) 25 MG tablet Take 1 tablet (25 mg total) by mouth every evening. 07/13/17   Steele Sizer, MD  cephALEXin (KEFLEX) 500 MG capsule Take 1 capsule (500 mg total) by mouth 4 (four) times daily for 10 days. 07/17/17 07/27/17  Laban Emperor, PA-C  cetirizine (ZYRTEC) 10 MG tablet Take 10 mg by mouth daily.    [provider]  citalopram (CELEXA) 20 MG tablet Take 1 tablet (20 mg total) by mouth daily. 07/13/17   Steele Sizer, MD  diclofenac sodium (VOLTAREN) 1 % GEL APPLY 4 GRAMS TOPICALLY FOUR TIMES A DAY 07/13/17  Steele Sizer, MD  fluticasone furoate-vilanterol (BREO ELLIPTA) 100-25 MCG/INH AEPB Inhale 1 puff into the lungs daily. 07/13/17   Steele Sizer, MD  glucose blood test strip 1 strip by Other route 2 (two) times daily. 05/23/11   [provider]  lisinopril (PRINIVIL,ZESTRIL) 40 MG tablet Take 1 tablet (40 mg total) by mouth daily. 07/13/17   Steele Sizer, MD  metFORMIN (GLUCOPHAGE) 1000 MG tablet Take 1 tablet (1,000 mg total) by mouth 2 (two) times daily  with a meal. 07/13/17   Ancil Boozer, Drue Stager, MD  Multiple Vitamins-Minerals (MENS MULTIVITAMIN PLUS) TABS Take by mouth.    [provider]  Omega-3 Fatty Acids (FISH OIL) 1000 MG CAPS Take by mouth daily.    [provider]  oxyCODONE-acetaminophen (PERCOCET) 5-325 MG tablet Take 1 tablet by mouth every 4 (four) hours as needed for severe pain. 07/17/17 07/17/18  Laban Emperor, PA-C  rosuvastatin (CRESTOR) 20 MG tablet Take 1 tablet (20 mg total) by mouth daily. 07/13/17   Steele Sizer, MD    Allergies Lipitor [atorvastatin] and Viagra  [sildenafil citrate]  Family History  Problem Relation Age of Onset  . Hypertension Mother   . Diabetes Mother   . Diabetes Father   . Hypertension Father   . Cancer Father   . Seizures Daughter   . Lupus Daughter   . Cancer Maternal Grandfather        Prostate  . Diabetes Paternal Grandfather   . Prostate cancer Maternal Uncle   . Bladder Cancer Neg Hx   . Kidney cancer Neg Hx     Social History Social History   Tobacco Use  . Smoking status: Former Smoker    Packs/day: 1.00    Years: 10.00    Pack years: 10.00    Types: Cigarettes    Start date: 04/20/1995    Last attempt to quit: 04/19/2005    Years since quitting: 12.2  . Smokeless tobacco: Never Used  Substance Use Topics  . Alcohol use: Yes    Alcohol/week: 6.0 oz    Types: 10 Cans of beer per week    Comment: occasional  . Drug use: No     Review of Systems  Constitutional: No fever/chills Cardiovascular: No chest pain. Respiratory: No SOB. Gastrointestinal: No abdominal pain.  No nausea, no vomiting.  Musculoskeletal: Positive for finger pain. Skin: Negative for rash, ecchymosis. Positive for laceration.  ____________________________________________   PHYSICAL EXAM:  VITAL SIGNS: ED Triage Vitals  Enc Vitals Group     BP 07/17/17 1108 137/88     Pulse Rate 07/17/17 1108 (!) 111     Resp 07/17/17 1108 18     Temp 07/17/17 1108 98.7 F (37.1 C)      Temp Source 07/17/17 1108 Oral     SpO2 07/17/17 1108 96 %     Weight 07/17/17 1110 (!) 316 lb (143.3 kg)     Height 07/17/17 1110 6\' 3"  (1.905 m)     Head Circumference --      Peak Flow --      Pain Score 07/17/17 1110 5     Pain Loc --      Pain Edu? --      Excl. in [place]? --      Constitutional: Alert and oriented. Well appearing and in no acute distress. Eyes: Conjunctivae are normal. PERRL. EOMI. Head: Atraumatic. ENT:      Ears:      Nose: No congestion/rhinnorhea.      Mouth/Throat:  Mucous membranes are moist.  Neck: No stridor.   Cardiovascular: Normal rate, regular rhythm.  Good peripheral circulation. Symmetric radial pulses bilaterally. Respiratory: Normal respiratory effort without tachypnea or retractions. Lungs CTAB. Good air entry to the bases with no decreased or absent breath sounds. Musculoskeletal: Full range of motion to all extremities. No gross deformities appreciated. Able to perform resisted flection and extension. Neurologic:  Normal speech and language. No gross focal neurologic deficits are appreciated.  Skin:  Skin is warm, dry. 3 cm U shaped, shredded laceration to volar left index finger.   ____________________________________________   LABS (all labs ordered are listed, but only abnormal results are displayed)  Labs Reviewed - No data to display ____________________________________________  EKG   ____________________________________________  RADIOLOGY Robinette Haines, personally viewed and evaluated these images (plain radiographs) as part of my medical decision making, as well as reviewing the written report by the radiologist.  Dg Finger Index Left  Result Date: 07/17/2017 CLINICAL DATA:  Acute injury and laceration to index finger. Initial encounter. EXAM: LEFT INDEX FINGER 2+V COMPARISON:  None. FINDINGS: There is no evidence of acute fracture, subluxation or dislocation. No radiopaque foreign bodies noted. Soft tissue irregularity  overlying the PIP joint noted. IMPRESSION: Soft tissue injury without acute bony abnormality or radiopaque foreign body. Electronically Signed   By: Margarette Canada M.D.   On: 07/17/2017 12:21    ____________________________________________    PROCEDURES  Procedure(s) performed:    Procedures  LACERATION REPAIR Performed by: Laban Emperor  Consent: Verbal consent obtained.  Consent given by: patient  Prepped and Draped in normal sterile fashion  Wound explored: No foreign bodies   Laceration Location: left index finger  Anesthesia: None  Local anesthetic: lidocaine 1%without epinephrine  Anesthetic total: 6 ml  Irrigation method: syringe  Amount of cleaning: 1 Lnormal saline  Skin closure: 4-0 nylon  Number of sutures: 23  Technique: Simple interrupted  Patient tolerance: Patient tolerated the procedure well with no immediate complications.  Medications  lidocaine (PF) (XYLOCAINE) 1 % injection 5 mL (5 mLs Intradermal Given by Other 07/17/17 1300)     ____________________________________________   INITIAL IMPRESSION / ASSESSMENT AND PLAN / ED COURSE  Pertinent labs & imaging results that were available during my care of the patient were reviewed by me and considered in my medical decision making (see chart for details).  Review of the  CSRS was performed in accordance of the [place] prior to dispensing any controlled drugs.    Patient's diagnosis is consistent with finger laceration.  X-ray negative for acute abnormalities. Finger laceration was repaired with stitches.  I do not suspect tendon damage.  Wound was wrapped and splint was placed.  Patient will be discharged home with prescriptions for Percocet and Keflex.  Patient is to follow up with PCP and hand ortho as directed. Patient is given ED precautions to return to the ED for any worsening or new symptoms.     ____________________________________________  FINAL CLINICAL IMPRESSION(S) / ED  DIAGNOSES  Final diagnoses:  Laceration of left index finger without foreign body without damage to nail, initial encounter      NEW MEDICATIONS STARTED DURING THIS VISIT:  ED Discharge Orders        Ordered    cephALEXin (KEFLEX) 500 MG capsule  4 times daily     07/17/17 1357    oxyCODONE-acetaminophen (PERCOCET) 5-325 MG tablet  Every 4 hours PRN     07/17/17 1357  This chart was dictated using voice recognition software/Dragon. Despite best efforts to proofread, errors can occur which can change the meaning. Any change was purely unintentional.    Laban Emperor, PA-C 07/17/17 1428    Nena Polio, MD 07/17/17 [id number]

## 2017-07-17 NOTE — ED Notes (Signed)
Pt states cut finger on a skill saw - tetanus utd within the last year per pt. Fingered dressed and bleeding controlled.

## 2017-07-17 NOTE — ED Triage Notes (Signed)
Pt arrived via POV from home with laceration on left index finger from skill saw PTA.   Bleeding controlled at this time. Last tetanus was this year per pt.

## 2017-07-19 ENCOUNTER — Telehealth: Payer: Self-pay | Admitting: *Deleted

## 2017-07-19 DIAGNOSIS — Z87891 Personal history of nicotine dependence: Secondary | ICD-10-CM

## 2017-07-19 DIAGNOSIS — Z122 Encounter for screening for malignant neoplasm of respiratory organs: Secondary | ICD-10-CM

## 2017-07-19 NOTE — Telephone Encounter (Signed)
Received referral for initial lung cancer screening scan. Contacted patient and obtained smoking history,(former, quit 2009, 30 pack year) as well as answering questions related to screening process. Patient denies signs of lung cancer such as weight loss or hemoptysis. Patient denies comorbidity that would prevent curative treatment if lung cancer were found. Patient is scheduled for shared decision making visit and CT scan on 07/28/17.

## 2017-07-27 ENCOUNTER — Ambulatory Visit: Payer: BC Managed Care – PPO | Admitting: Nurse Practitioner

## 2017-07-27 ENCOUNTER — Encounter: Payer: Self-pay | Admitting: Nurse Practitioner

## 2017-07-27 VITALS — BP 124/80 | HR 94 | Temp 98.1°F | Resp 18 | Ht 75.0 in | Wt 317.0 lb

## 2017-07-27 DIAGNOSIS — Z4802 Encounter for removal of sutures: Secondary | ICD-10-CM

## 2017-07-27 NOTE — Progress Notes (Addendum)
Name: Isaac Cross   MRN: [id number]    DOB: 1961/01/14   Date:07/27/2017       Progress Note  Subjective  Chief Complaint  Chief Complaint  Patient presents with  . Hand Pain    stitches removed, cut finger working in camper, Got 24 stitches went to ER    HPI  Patient presents for suture removal from left index finger- 23 sutures placed 10 days ago. Finger laceration is closed throughout,  Hypertrophied scaring noted. No drainage, bleeding, heat, redness, streaking noted. Patient able to bend his finger without issue.  Tetanus shot last year.   Patient Active Problem List   Diagnosis Date Noted  . Atherosclerosis of abdominal aorta ([place]) 06/17/2015  . Fatty liver disease, nonalcoholic 30/86/5784  . Renal cyst, right 06/17/2015  . BPH (benign prostatic hyperplasia) 05/25/2015  . Microscopic hematuria 05/25/2015  . Second degree hemorrhoids   . Diverticulosis of large intestine without diverticulitis   . Asthma, mild intermittent, well-controlled 10/16/2014  . Allergic rhinitis 10/13/2014  . ED (erectile dysfunction) of organic origin 07/22/2014  . Gastro-esophageal reflux disease without esophagitis 07/22/2014  . Benign hypertension 07/22/2014  . 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 Adult BMI 30+ 07/22/2014  . Chronic obstructive asthma ([place]) 07/22/2014  . Depression with anxiety 07/22/2014  . Dyslipidemia 07/22/2014  . Genital herpes 07/22/2014  . 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 Osteoarthritis of both knees 07/22/2014  . Obstructive apnea 05/14/2013  . 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 Blood in the urine 09/20/2006    Past Medical History:  Diagnosis Date  . Allergy   . Anxiety   . Arthritis    knees  . Asthma, mild intermittent, well-controlled   . Chronic obstructive asthma ([place])   . COPD (chronic obstructive pulmonary disease) ([place])   . Diabetes ([place])   . GERD (gastroesophageal reflux disease)   . HBP (high  blood pressure)   . High cholesterol   . Renal cyst, right   . Sleep apnea    CPAP  . Swelling     Past Surgical History:  Procedure Laterality Date  . BLADDER REPAIR    . COLONOSCOPY WITH PROPOFOL N/A 04/07/2015   Procedure: COLONOSCOPY WITH PROPOFOL;  Surgeon: Lucilla Lame, MD;  Location: [place];  Service: Endoscopy;  Laterality: N/A;  Diabetic - oral meds CPAP  . HAND SURGERY     4TH AND 5TH FINGER  . KNEE ARTHROSCOPY Right   . VEIN SURGERY Right     Social History   Tobacco Use  . Smoking status: Former Smoker    Packs/day: 1.00    Years: 10.00    Pack years: 10.00    Types: Cigarettes    Start date: 04/20/1995    Last attempt to quit: 04/19/2005    Years since quitting: 12.2  . Smokeless tobacco: Never Used  Substance Use Topics  . Alcohol use: Yes    Alcohol/week: 6.0 oz    Types: 10 Cans of beer per week    Comment: occasional     Current Outpatient Medications:  .  acetaminophen (TYLENOL) 500 MG tablet, Take 1 tablet (500 mg total) by mouth every 6 (six) hours as needed., Disp: 90 tablet, Rfl: 0 .  albuterol (PROVENTIL HFA;VENTOLIN HFA) 108 (90 Base) MCG/ACT inhaler, Inhale 2 puffs into the lungs 4 (four) times daily., Disp: 1 Inhaler, Rfl: 0 .  aspirin 81 MG tablet, Take 81 mg by mouth daily., Disp: ,  Rfl:  .  atenolol (TENORMIN) 25 MG tablet, Take 1 tablet (25 mg total) by mouth every evening., Disp: 90 tablet, Rfl: 1 .  cetirizine (ZYRTEC) 10 MG tablet, Take 10 mg by mouth daily., Disp: , Rfl:  .  citalopram (CELEXA) 20 MG tablet, Take 1 tablet (20 mg total) by mouth daily., Disp: 90 tablet, Rfl: 1 .  diclofenac sodium (VOLTAREN) 1 % GEL, APPLY 4 GRAMS TOPICALLY FOUR TIMES A DAY, Disp: 100 g, Rfl: 2 .  fluticasone furoate-vilanterol (BREO ELLIPTA) 100-25 MCG/INH AEPB, Inhale 1 puff into the lungs daily., Disp: 60 each, Rfl: 5 .  glucose blood test strip, 1 strip by Other route 2 (two) times daily., Disp: , Rfl:  .  lisinopril (PRINIVIL,ZESTRIL) 40  MG tablet, Take 1 tablet (40 mg total) by mouth daily., Disp: 90 tablet, Rfl: 1 .  metFORMIN (GLUCOPHAGE) 1000 MG tablet, Take 1 tablet (1,000 mg total) by mouth 2 (two) times daily with a meal., Disp: 180 tablet, Rfl: 1 .  Multiple Vitamins-Minerals (MENS MULTIVITAMIN PLUS) TABS, Take by mouth., Disp: , Rfl:  .  Omega-3 Fatty Acids (FISH OIL) 1000 MG CAPS, Take by mouth daily., Disp: , Rfl:  .  oxyCODONE-acetaminophen (PERCOCET) 5-325 MG tablet, Take 1 tablet by mouth every 4 (four) hours as needed for severe pain., Disp: 6 tablet, Rfl: 0 .  rosuvastatin (CRESTOR) 20 MG tablet, Take 1 tablet (20 mg total) by mouth daily., Disp: 90 tablet, Rfl: 1 .  cephALEXin (KEFLEX) 500 MG capsule, Take 1 capsule (500 mg total) by mouth 4 (four) times daily for 10 days. (Patient not taking: Reported on 07/27/2017), Disp: 40 capsule, Rfl: 0  Allergies  Allergen Reactions  . Lipitor [Atorvastatin] Hives and Itching  . Viagra  [Sildenafil Citrate]     vision loss    ROS   No other specific complaints in a complete review of systems (except as listed in HPI above).  Objective  Vitals:   07/27/17 0816  BP: 124/80  Pulse: 94  Resp: 18  Temp: 98.1 F (36.7 C)  TempSrc: Oral  SpO2: 97%  Weight: (!) 317 lb (143.8 kg)  Height: 6\' 3"  (1.905 m)    Body mass index is 39.62 kg/m.  Nursing Note and Vital Signs reviewed.  Physical Exam  Constitutional: Patient appears well-developed and well-nourished.  Skin: laceration well-healed; see HPI Psychiatric: Patient has a normal mood and affect. behavior is normal. Judgment and thought content normal.  No results found for this or any previous visit (from the past 72 hour(s)).  Assessment & Plan  1. Encounter for removal of sutures Removed, hypertrophied scarring noted, no signs of infection. Td UTD   ------------------------------------------------ I have reviewed this encounter including the documentation in this note and/or discussed this  patient with the provider, Suezanne Cheshire DNP AGNP-C. I am certifying that I agree with the content of this note as supervising physician. Enid Derry, [facility] 07/27/2017, 2:22 PM

## 2017-07-28 ENCOUNTER — Inpatient Hospital Stay: Payer: BC Managed Care – PPO | Attending: Nurse Practitioner | Admitting: Nurse Practitioner

## 2017-07-28 ENCOUNTER — Ambulatory Visit
Admission: RE | Admit: 2017-07-28 | Discharge: 2017-07-28 | Disposition: A | Discharge status: Home / Self Care | Payer: BC Managed Care – PPO | Source: Ambulatory Visit | Attending: Nurse Practitioner | Admitting: Nurse Practitioner

## 2017-07-28 DIAGNOSIS — K5730 Diverticulosis of large intestine without perforation or abscess without bleeding: Secondary | ICD-10-CM | POA: Diagnosis not present

## 2017-07-28 DIAGNOSIS — I2510 Atherosclerotic heart disease of native coronary artery without angina pectoris: Secondary | ICD-10-CM | POA: Diagnosis not present

## 2017-07-28 DIAGNOSIS — N62 Hypertrophy of breast: Secondary | ICD-10-CM | POA: Diagnosis not present

## 2017-07-28 DIAGNOSIS — J432 Centrilobular emphysema: Secondary | ICD-10-CM | POA: Diagnosis not present

## 2017-07-28 DIAGNOSIS — Z87891 Personal history of nicotine dependence: Secondary | ICD-10-CM | POA: Diagnosis not present

## 2017-07-28 DIAGNOSIS — N281 Cyst of kidney, acquired: Secondary | ICD-10-CM | POA: Diagnosis not present

## 2017-07-28 DIAGNOSIS — M47814 Spondylosis without myelopathy or radiculopathy, thoracic region: Secondary | ICD-10-CM | POA: Diagnosis not present

## 2017-07-28 DIAGNOSIS — Z122 Encounter for screening for malignant neoplasm of respiratory organs: Secondary | ICD-10-CM | POA: Diagnosis not present

## 2017-07-28 DIAGNOSIS — I700 Atherosclerosis of aorta: Secondary | ICD-10-CM | POA: Diagnosis not present

## 2017-07-28 IMAGING — CT CT CHEST LUNG CANCER SCREENING LOW DOSE W/O CM
2 of 5 series · 15 of 40 positions shown, 18 images · non-contrast
Comparison: [DATE] chest radiograph.

CLINICAL DATA: 57-year-old asymptomatic male former smoker, quit
smoking 10 years prior.

EXAM:
CT CHEST WITHOUT CONTRAST LOW-DOSE FOR LUNG CANCER SCREENING
TECHNIQUE: Multidetector CT imaging of the chest was performed following the
standard protocol without IV contrast.

[Series 3: lung · axial · 0.78mm/px · z∈[-1245,-905]mm · 12 of 378 slices shown, 15 images (1 of 2)]
[im 19/378  mediastinal]
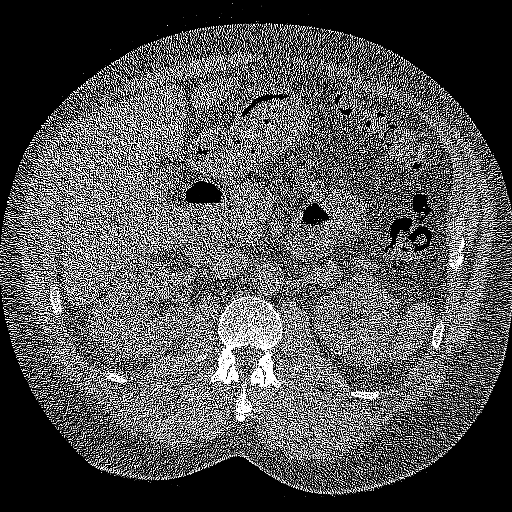
[im 19/378  lung]
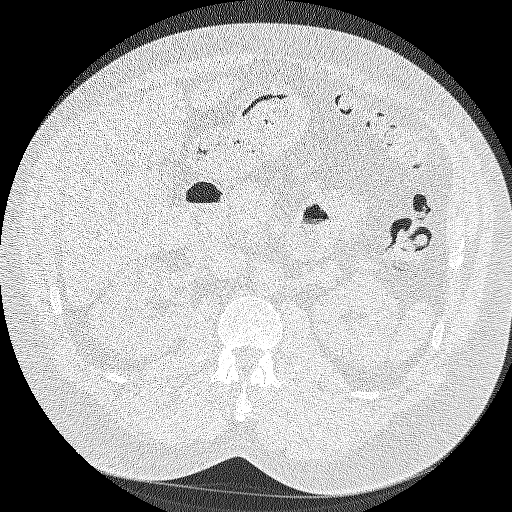
[im 57/378  lung]
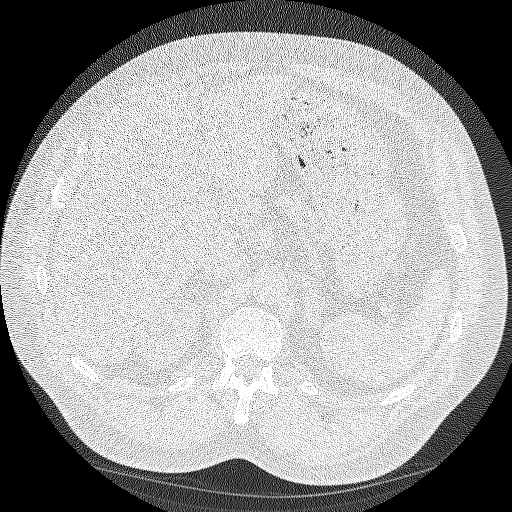
[im 76/378  lung]
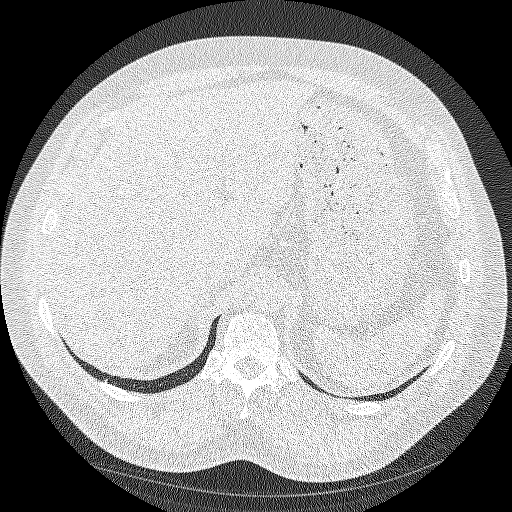
[im 114/378  lung]
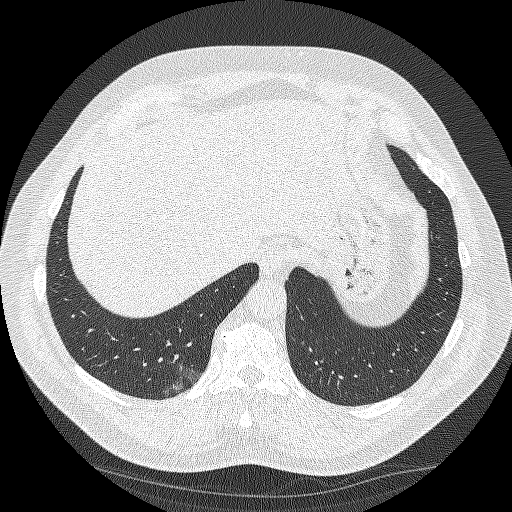
[im 151/378  mediastinal]
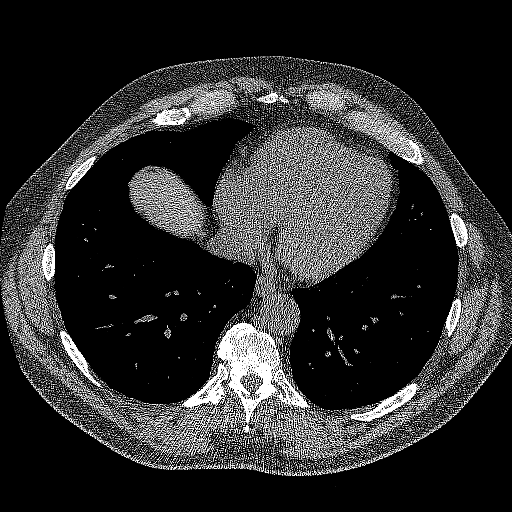
[im 151/378  lung]
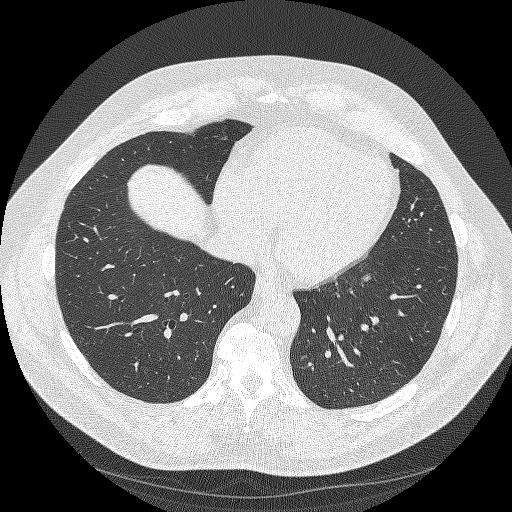
[im 170/378  lung]
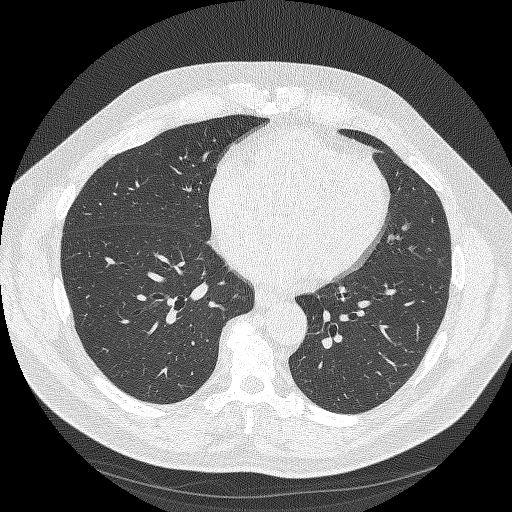
[im 208/378  lung]
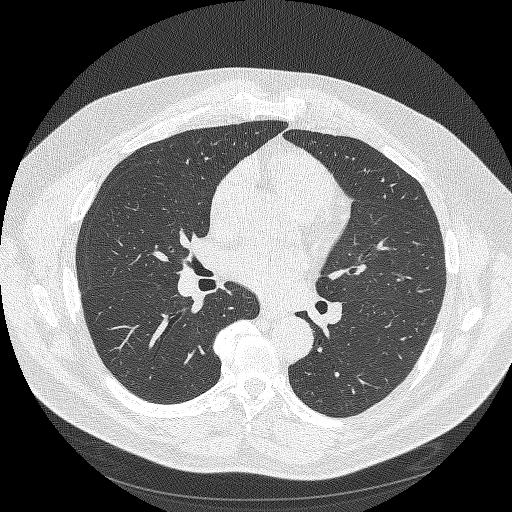
[im 227/378  lung]
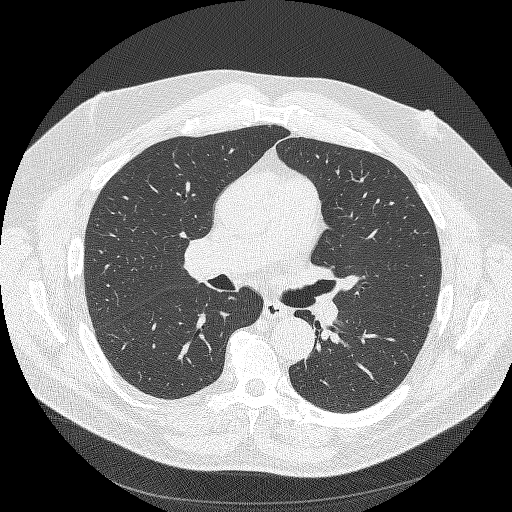
[im 264/378  mediastinal]
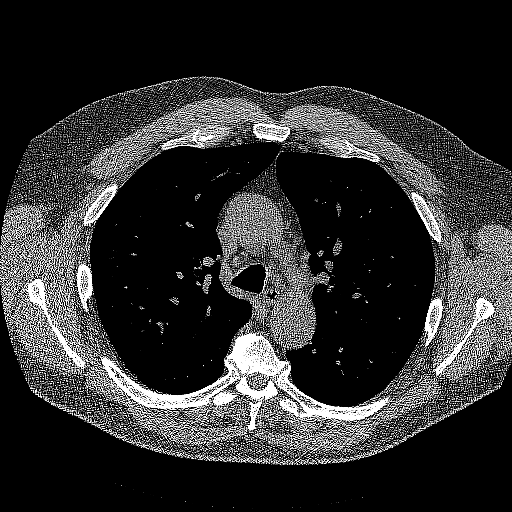
[im 264/378  lung]
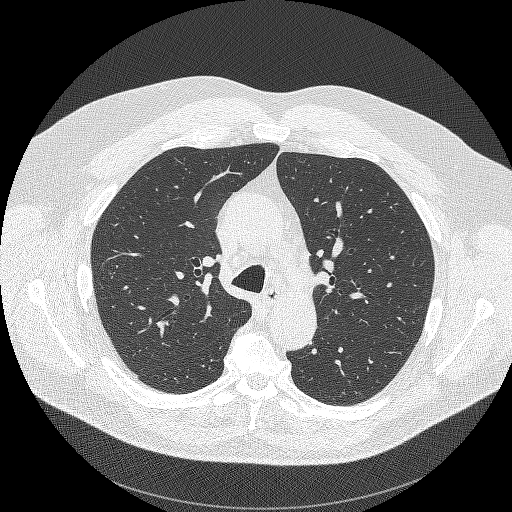
[im 302/378  lung]
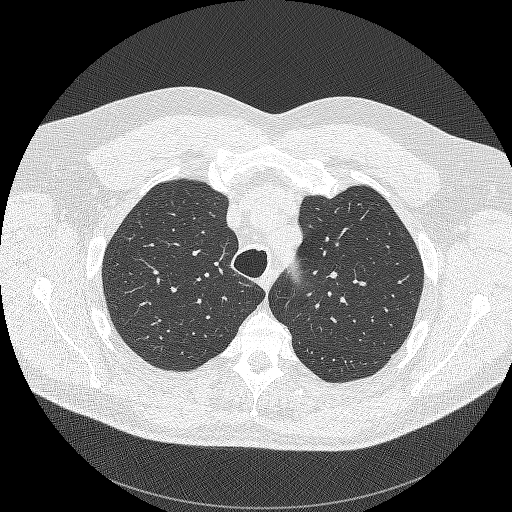
[im 321/378  lung]
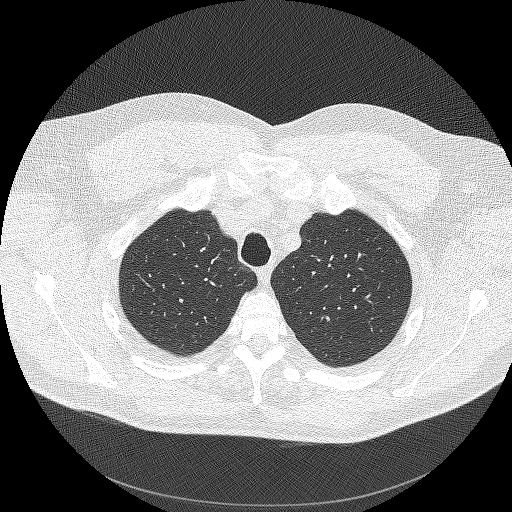
[im 359/378  lung]
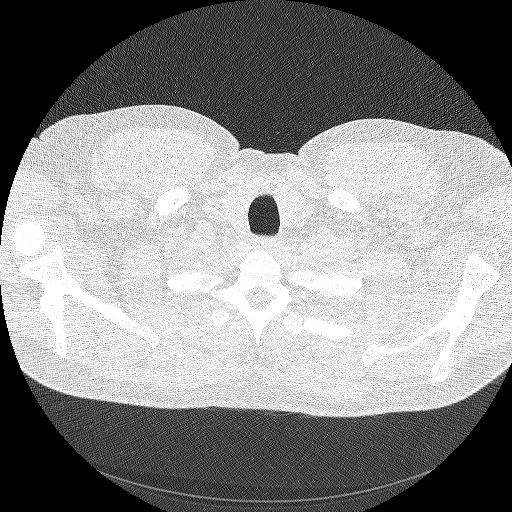

[Series 4: lung · coronal · 0.74mm/px · 3 of 352 slices shown (2 of 2)]
[im 71/352  lung]
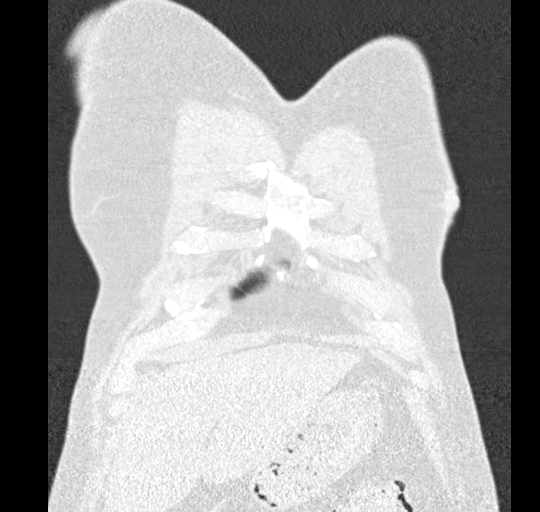
[im 141/352  lung]
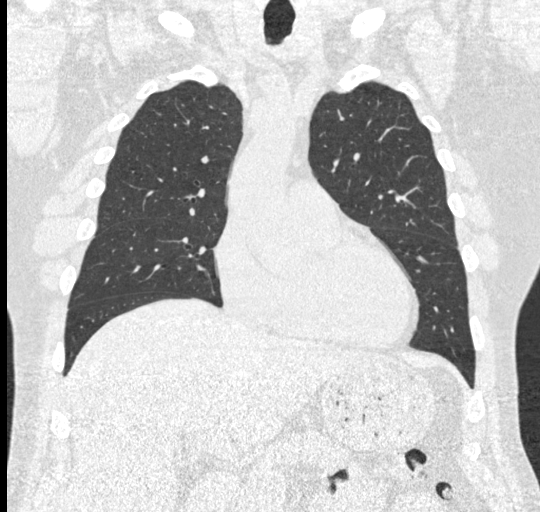
[im 211/352  lung]
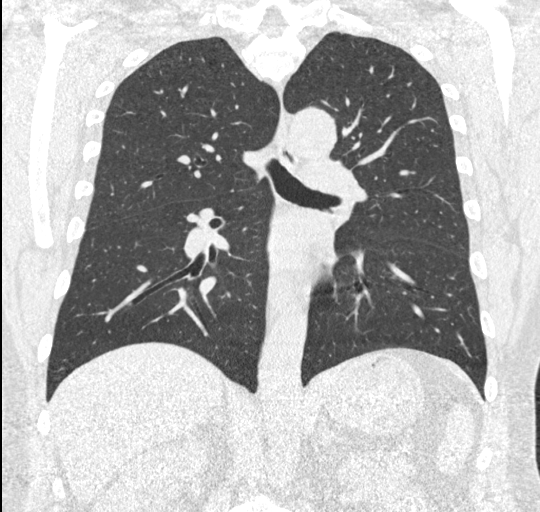

[15 of 40 positions shown; findings below may reference images not displayed]

FINDINGS: Cardiovascular: Normal heart size. No significant pericardial
fluid/thickening. Left anterior descending coronary atherosclerosis.
Minimally atherosclerotic nonaneurysmal thoracic aorta. Normal
caliber pulmonary arteries.

Mediastinum/Nodes: No discrete thyroid nodules. Unremarkable
esophagus. No pathologically enlarged axillary, mediastinal or gross
hilar lymph nodes, noting limited sensitivity for the detection of
hilar adenopathy on this noncontrast study.

Lungs/Pleura: No pneumothorax. No pleural effusion. Mild
centrilobular emphysema. No acute consolidative airspace disease or
lung masses. Tiny solid anterior right upper lobe pulmonary nodule
measuring 2.0 mm in volume derived mean diameter (series 3/image
107). No additional significant pulmonary nodules.

Upper abdomen: Partially visualized simple 3.4 cm anterior
interpolar right renal cyst. Colonic diverticulosis.

Musculoskeletal: No aggressive appearing focal osseous lesions. Mild
gynecomastia, asymmetric to the left. Moderate thoracic spondylosis.
IMPRESSION: 1. Lung-RADS 2, benign appearance or behavior. Continue annual
screening with low-dose chest CT without contrast in 12 months.
2. One vessel coronary atherosclerosis.

Aortic Atherosclerosis ([U4]-[U4]) and Emphysema ([U4]-[U4]).

## 2017-07-28 NOTE — Progress Notes (Signed)
In accordance with CMS guidelines, patient has met eligibility criteria including age, absence of signs or symptoms of lung cancer.  Social History   Tobacco Use  . Smoking status: Former Smoker    Packs/day: 1.00    Years: 10.00    Pack years: 10.00    Types: Cigarettes    Start date: 04/20/1995    Last attempt to quit: 04/19/2005    Years since quitting: 12.2  . Smokeless tobacco: Never Used  Substance Use Topics  . Alcohol use: Yes    Alcohol/week: 6.0 oz    Types: 10 Cans of beer per week    Comment: occasional  . Drug use: No      A shared decision-making session was conducted prior to the performance of CT scan. This includes one or more decision aids, includes benefits and harms of screening, follow-up diagnostic testing, over-diagnosis, false positive rate, and total radiation exposure.   Counseling on the importance of adherence to annual lung cancer LDCT screening, impact of co-morbidities, and ability or willingness to undergo diagnosis and treatment is imperative for compliance of the program.   Counseling on the importance of continued smoking cessation for former smokers; the importance of smoking cessation for current smokers, and information about tobacco cessation interventions have been given to patient including [place] and 1800 quit [place] programs.   Written order for lung cancer screening with LDCT has been given to the patient and any and all questions have been answered to the best of my abilities.    Yearly follow up will be coordinated by Burgess Estelle, Thoracic Navigator.  Beckey Rutter, DNP, AGNP-C [place] [facility] [phone] (work cell) [phone] (office) 07/28/17 3:36 PM

## 2017-07-29 ENCOUNTER — Encounter: Payer: Self-pay | Admitting: *Deleted

## 2017-08-10 ENCOUNTER — Encounter: Payer: Self-pay | Admitting: Family Medicine

## 2017-08-28 NOTE — Progress Notes (Signed)
Cardiology Office Note  Date:  08/29/2017   ID:  Isaac Cross, DOB 1960/04/26, MRN [id number]  PCP:  Steele Sizer, MD   Chief Complaint  Patient presents with  . other    Ref by Dr. Ancil Boozer for abnormal EKG and HTN. Meds reviewed by the pt. verbally. Pt. c/o LE edema. Denies chest pain or shortness of breath.    HPI:  Isaac Cross is a 57 yo gentleman with past medical history of Smoker, quit 10 yrs ago Alcohol Diabetes type 2 Hypertension Depression anxiety Hyperlipidemia COPD/asthma Who presents by referral from Dr. Ancil Boozer for consultation of his Atherosclerosis of aorta, hand coronary calcification, and abnormal EKG  He reports that he feels well Limited by chronic knee pain Had cortisone shots months ago may be helping a little bit Trying to put off surgery on his knee  Long history of diabetes, hemoglobin A1c relatively well-controlled recently 6.3  history of hyperlipidemia now well controlled on Crestor Prior history of smoking but stopped 10 years ago  CT chest, images pulled up in the office and discussed with him One vessel coronary atherosclerosis on the report We did review the images and there is minimal calcified plaque in the proximal and mid LAD, tiny speckles Also tiny speckle in the aortic arch Otherwise relatively clear without significant coronary calcification or aortic atherosclerosis  Lab work reviewed with him in detail Total chol 120 HBA1C 6.2  EKG personally reviewed by myself on todays visit  EKG on today's visitShowing normal sinus rhythm rate 77 bpm   poor R-wave progression V1 through V3, likely lead placement EKG done through primary care 03/27/2019Normal sinus rhythm, again relatively benign appearing poor R-wave progression V1 through V4 likely lead placement  PMH:   has a past medical history of Allergy, Anxiety, Arthritis, Asthma, mild intermittent, well-controlled, Chronic obstructive asthma ([place]), COPD (chronic obstructive  pulmonary disease) ([place]), Diabetes ([place]), GERD (gastroesophageal reflux disease), HBP (high blood pressure), High cholesterol, Renal cyst, right, Sleep apnea, and Swelling.  PSH:    Past Surgical History:  Procedure Laterality Date  . BLADDER REPAIR    . COLONOSCOPY WITH PROPOFOL N/A 04/07/2015   Procedure: COLONOSCOPY WITH PROPOFOL;  Surgeon: Lucilla Lame, MD;  Location: [place];  Service: Endoscopy;  Laterality: N/A;  Diabetic - oral meds CPAP  . HAND SURGERY     4TH AND 5TH FINGER  . KNEE ARTHROSCOPY Right   . VEIN SURGERY Right     Current Outpatient Medications  Medication Sig Dispense Refill  . acetaminophen (TYLENOL) 500 MG tablet Take 1 tablet (500 mg total) by mouth every 6 (six) hours as needed. 90 tablet 0  . albuterol (PROVENTIL HFA;VENTOLIN HFA) 108 (90 Base) MCG/ACT inhaler Inhale 2 puffs into the lungs 4 (four) times daily. 1 Inhaler 0  . aspirin 81 MG tablet Take 81 mg by mouth daily.    Marland Kitchen atenolol (TENORMIN) 25 MG tablet Take 1 tablet (25 mg total) by mouth every evening. 90 tablet 1  . cetirizine (ZYRTEC) 10 MG tablet Take 10 mg by mouth daily.    . citalopram (CELEXA) 20 MG tablet Take 1 tablet (20 mg total) by mouth daily. 90 tablet 1  . diclofenac sodium (VOLTAREN) 1 % GEL APPLY 4 GRAMS TOPICALLY FOUR TIMES A DAY 100 g 2  . fluticasone furoate-vilanterol (BREO ELLIPTA) 100-25 MCG/INH AEPB Inhale 1 puff into the lungs daily. 60 each 5  . glucose blood test strip 1 strip by Other route 2 (two) times  daily.    . lisinopril (PRINIVIL,ZESTRIL) 40 MG tablet Take 1 tablet (40 mg total) by mouth daily. 90 tablet 1  . metFORMIN (GLUCOPHAGE) 1000 MG tablet Take 1 tablet (1,000 mg total) by mouth 2 (two) times daily with a meal. 180 tablet 1  . Multiple Vitamins-Minerals (MENS MULTIVITAMIN PLUS) TABS Take by mouth.    . Omega-3 Fatty Acids (FISH OIL) 1000 MG CAPS Take by mouth daily.    Marland Kitchen oxyCODONE-acetaminophen (PERCOCET) 5-325 MG tablet Take 1 tablet by mouth  every 4 (four) hours as needed for severe pain. 6 tablet 0  . rosuvastatin (CRESTOR) 20 MG tablet Take 1 tablet (20 mg total) by mouth daily. 90 tablet 1   No current facility-administered medications for this visit.      Allergies:   Lipitor [atorvastatin]; Other; and Viagra  [sildenafil citrate]   Social History:  The patient  reports that he quit smoking about 12 years ago. His smoking use included cigarettes. He started smoking about 22 years ago. He has a 10.00 pack-year smoking history. He has never used smokeless tobacco. He reports that he drinks about 6.0 oz of alcohol per week. He reports that he does not use drugs.   Family History:   family history includes Cancer in his father and maternal grandfather; Diabetes in his father, mother, and paternal grandfather; Hypertension in his father and mother; Lupus in his daughter; Prostate cancer in his maternal uncle; Seizures in his daughter.    Review of Systems: Review of Systems  Constitutional: Negative.   Respiratory: Negative.   Cardiovascular: Negative.   Gastrointestinal: Negative.   Musculoskeletal: Negative.   Neurological: Negative.   Psychiatric/Behavioral: Negative.   All other systems reviewed and are negative.    PHYSICAL EXAM: VS:  BP 118/68 (BP Location: Right Arm, Patient Position: Sitting, Cuff Size: Large)   Pulse 77   Ht 6\' 3"  (1.905 m)   Wt (!) 310 lb 8 oz (140.8 kg)   BMI 38.81 kg/m  , BMI Body mass index is 38.81 kg/m. GEN: Well nourished, well developed, in no acute distress  HEENT: normal  Neck: no JVD, carotid bruits, or masses Cardiac: RRR; no murmurs, rubs, or gallops,no edema  Respiratory:  clear to auscultation bilaterally, normal work of breathing GI: soft, nontender, nondistended, + BS MS: no deformity or atrophy  Skin: warm and dry, no rash Neuro:  Strength and sensation are intact Psych: euthymic mood, full affect    Recent Labs: 12/08/2016: Hemoglobin 13.3; Platelets  219 07/13/2017: ALT 28; BUN 10; Creat 0.97; Potassium 4.3; Sodium 139    Lipid Panel Lab Results  Component Value Date   CHOL 127 07/13/2017   HDL 42 07/13/2017   LDLCALC 64 07/13/2017   TRIG 128 07/13/2017      Wt Readings from Last 3 Encounters:  08/29/17 (!) 310 lb 8 oz (140.8 kg)  07/28/17 (!) 316 lb (143.3 kg)  07/27/17 (!) 317 lb (143.8 kg)       ASSESSMENT AND PLAN:  Atherosclerosis of abdominal aorta (HCC) - Plan: EKG 12-Lead Minimal atherosclerosis seen in the aortic arch Of little concern No significant calcification noted of carotids  Coronary calcifications seen on CT scan Minimal calcification in the proximal and mid LAD Also likely of little clinical concern No symptoms concerning for angina Cholesterol well controlled, diabetes well-controlled No further testing needed at this time as he is asymptomatic Long discussion with him concerning above  Benign hypertension - Plan: EKG 12-Lead Blood pressure is well  controlled on today's visit. No changes made to the medications.  Chronic venous insufficiency Leg swelling bilaterally, perhaps worse on the right Recommended compression hose if tolerated  Chronic obstructive asthma ([place]) Stopped smoking 10 years ago, denies significant shortness of breath  Type 2 diabetes mellitus with microalbuminuria, without long-term current use of insulin (HCC) Diabetes numbers well-controlled Recommended regular exercise program and weight loss   Dyslipidemia Cholesterol is at goal on the current lipid regimen. No changes to the medications were made.  Abnormal EKG In the setting of minimal coronary calcification and no symptoms no further testing needed Risk factors well-controlled Findings likely secondary to lead placement  Disposition:   F/U  As needed  Patient seen in consultation for Dr. Ancil Boozer and will be referred back to her office for ongoing care of the issues detailed above   Total encounter time more  than 60 minutes  Greater than 50% was spent in counseling and coordination of care with the patient    Orders Placed This Encounter  Procedures  . EKG 12-Lead     Signed, Esmond Plants, M.D., Ph.D. 08/29/2017  [place], [place]

## 2017-08-29 ENCOUNTER — Encounter: Payer: Self-pay | Admitting: Cardiovascular Disease

## 2017-08-29 ENCOUNTER — Ambulatory Visit: Payer: BC Managed Care – PPO | Admitting: Cardiovascular Disease

## 2017-08-29 VITALS — BP 118/68 | HR 77 | Ht 75.0 in | Wt 310.5 lb

## 2017-08-29 DIAGNOSIS — R809 Proteinuria, unspecified: Secondary | ICD-10-CM

## 2017-08-29 DIAGNOSIS — I700 Atherosclerosis of aorta: Secondary | ICD-10-CM | POA: Diagnosis not present

## 2017-08-29 DIAGNOSIS — J449 Chronic obstructive pulmonary disease, unspecified: Secondary | ICD-10-CM

## 2017-08-29 DIAGNOSIS — J4489 Other specified chronic obstructive pulmonary disease: Secondary | ICD-10-CM

## 2017-08-29 DIAGNOSIS — E1129 Type 2 diabetes mellitus with other diabetic kidney complication: Secondary | ICD-10-CM | POA: Diagnosis not present

## 2017-08-29 DIAGNOSIS — E785 Hyperlipidemia, unspecified: Secondary | ICD-10-CM | POA: Diagnosis not present

## 2017-08-29 DIAGNOSIS — I10 Essential (primary) hypertension: Secondary | ICD-10-CM

## 2017-08-29 NOTE — Patient Instructions (Signed)

## 2017-11-07 ENCOUNTER — Encounter: Payer: Self-pay | Admitting: Family Medicine

## 2017-12-02 ENCOUNTER — Encounter: Payer: Self-pay | Admitting: Family Medicine

## 2017-12-02 ENCOUNTER — Ambulatory Visit: Payer: BC Managed Care – PPO | Admitting: Family Medicine

## 2017-12-02 VITALS — BP 116/64 | HR 98 | Temp 98.0°F | Resp 18 | Ht 75.0 in | Wt 314.3 lb

## 2017-12-02 DIAGNOSIS — E1142 Type 2 diabetes mellitus with diabetic polyneuropathy: Secondary | ICD-10-CM

## 2017-12-02 DIAGNOSIS — E1129 Type 2 diabetes mellitus with other diabetic kidney complication: Secondary | ICD-10-CM

## 2017-12-02 DIAGNOSIS — J449 Chronic obstructive pulmonary disease, unspecified: Secondary | ICD-10-CM

## 2017-12-02 DIAGNOSIS — F418 Other specified anxiety disorders: Secondary | ICD-10-CM | POA: Diagnosis not present

## 2017-12-02 DIAGNOSIS — R809 Proteinuria, unspecified: Secondary | ICD-10-CM | POA: Diagnosis not present

## 2017-12-02 DIAGNOSIS — I2510 Atherosclerotic heart disease of native coronary artery without angina pectoris: Secondary | ICD-10-CM

## 2017-12-02 DIAGNOSIS — I700 Atherosclerosis of aorta: Secondary | ICD-10-CM

## 2017-12-02 DIAGNOSIS — I2584 Coronary atherosclerosis due to calcified coronary lesion: Secondary | ICD-10-CM

## 2017-12-02 DIAGNOSIS — I10 Essential (primary) hypertension: Secondary | ICD-10-CM

## 2017-12-02 DIAGNOSIS — E785 Hyperlipidemia, unspecified: Secondary | ICD-10-CM

## 2017-12-02 LAB — POCT GLYCOSYLATED HEMOGLOBIN (HGB A1C): HEMOGLOBIN A1C: 6 % — AB (ref 4.0–5.6)

## 2017-12-02 MED ORDER — FLUTICASONE FUROATE-VILANTEROL 100-25 MCG/INH IN AEPB: 1.0000 | INHALATION_SPRAY | Freq: Every day | RESPIRATORY_TRACT | 1 refills | Status: DC

## 2017-12-02 MED ORDER — ROSUVASTATIN CALCIUM 20 MG PO TABS: 20.0000 mg | ORAL_TABLET | Freq: Every day | ORAL | 1 refills | Status: DC

## 2017-12-02 MED ORDER — CITALOPRAM HYDROBROMIDE 20 MG PO TABS: 20.0000 mg | ORAL_TABLET | Freq: Every day | ORAL | 1 refills | Status: DC

## 2017-12-02 MED ORDER — ATENOLOL 25 MG PO TABS: 25.0000 mg | ORAL_TABLET | Freq: Every evening | ORAL | 1 refills | Status: DC

## 2017-12-02 MED ORDER — METFORMIN HCL 1000 MG PO TABS: 1000.0000 mg | ORAL_TABLET | Freq: Two times a day (BID) | ORAL | 1 refills | Status: DC

## 2017-12-02 MED ORDER — LISINOPRIL 40 MG PO TABS: 40.0000 mg | ORAL_TABLET | Freq: Every day | ORAL | 1 refills | Status: DC

## 2017-12-02 NOTE — Progress Notes (Signed)
Name: Isaac Cross   MRN: [id number]    DOB: Aug 07, 1960   Date:12/02/2017       Progress Note  Subjective  Chief Complaint  Chief Complaint  Patient presents with  . Medication Refill  . Diabetes  . Hyperlipidemia  . Hypertension  . Depression  . COPD  . Sleep Apnea    HPI  DMII: FSBS being checked occasionally, no hypoglycemia.Taking Metformin 1000 mg twice daily, he states diarrhea is less frequent than it used to be. 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 Eye exam is up to date.  HTN: taking medication, bp is at goal now, no palpitation, no chest pain, denies any dizziness.   Depression/Anxiety: taking Citalopram, in remission. Denies any symptoms of depression or anxiety at this time. He does not want to stop medication, afraid of having recurrence of symptoms    Hyperlipidemia: last lipid panel was in excellent control, continue Crestor. Denies side effects of medication Reviewed last labs. Also explained that his CT chest showed aortic atherosclerosis and one vessel disease. He was seen by Dr Rockey Situ yesterday. He is on medical management. Denies chest pain  COPD /Asthma: We changed from Advair to [facility] 2017, he is currently using Breo daily because it helps with allergy symptoms also.Denies cough, wheezing or SOB. He smoked one pack daily for 15 years but quit 12 years ago, he had CT chest done and it showed very small nodules, repeat for 3 more years and stop, next one due April 2020  OSA: complaint with CPAP, wears all night. Wakes up feeling refreshed and no morning headache, also denies snoring when using CPAP .He has mild swelling  on legs, but unchanged.   History of hematuria: seen by Dr. Karna Dupes, had a repeat CT , he has renal cysts otherwise negative studies. CT showed fatty liver and atherosclerosis of abdominal aorta. He is on high dose statin for atherosclerosis and has been released by Dr. Karna Dupes. He is on statin for atherosclerosis aorta. Unchanged.   OA both knees: right knee worse than left, daily pain. Pain is described as dull ache 6/10, and also having instability when standing for a long period of time and has been using a cane prn. He saw Ortho, he was given Meloxicam that he took only one dose and is still having pain, uses topical medication prn. Still has antalgic gait but not as severe today   Patient Active Problem List   Diagnosis Date Noted  . Coronary atherosclerosis due to calcified coronary lesion 12/02/2017  . Atherosclerosis of abdominal aorta ([place]) 06/17/2015  . Fatty liver disease, nonalcoholic 50/38/8828  . Renal cyst, right 06/17/2015  . BPH (benign prostatic hyperplasia) 05/25/2015  . Microscopic hematuria 05/25/2015  . Second degree hemorrhoids   . Diverticulosis of large intestine without diverticulitis   . Asthma, mild intermittent, well-controlled 10/16/2014  . Allergic rhinitis 10/13/2014  . ED (erectile dysfunction) of organic origin 07/22/2014  . Gastro-esophageal reflux disease without esophagitis 07/22/2014  . Benign hypertension 07/22/2014  . 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[place]) 07/22/2014  . Adult BMI 30+ 07/22/2014  . Chronic obstructive asthma ([place]) 07/22/2014  .  Depression with anxiety 07/22/2014  . Dyslipidemia 07/22/2014  . Genital herpes 07/22/2014  . 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 Osteoarthritis of both knees 07/22/2014  . Obstructive apnea 05/14/2013  . 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 Blood in the urine 09/20/2006    Past Surgical History:  Procedure Laterality Date  . BLADDER REPAIR    . COLONOSCOPY WITH PROPOFOL  N/A 04/07/2015   Procedure: COLONOSCOPY WITH PROPOFOL;  Surgeon: Lucilla Lame, MD;  Location: [place];  Service: Endoscopy;  Laterality: N/A;  Diabetic - oral meds CPAP  . HAND SURGERY     4TH AND 5TH FINGER  . KNEE ARTHROSCOPY Right   . VEIN SURGERY Right     Family History  Problem Relation Age of Onset  . Hypertension Mother   . Diabetes Mother   . Diabetes Father   . Hypertension Father   . Cancer Father   . Seizures Daughter   . Lupus Daughter   . Cancer Maternal Grandfather        Prostate  . Diabetes Paternal Grandfather   . Prostate cancer Maternal Uncle   . Bladder Cancer Neg Hx   . Kidney cancer Neg Hx     Social History   Socioeconomic History  . Marital status: Married    Spouse name: Not on file  . Number of children: 2  . Years of education: Not on file  . Highest education level: Bachelor's degree (e.g., BA, AB, BS)  Occupational History  . Occupation: case Freight forwarder   Social Needs  . Financial resource strain: Not hard at all  . Food insecurity:    Worry: Never true    Inability: Never true  . Transportation needs:    Medical: No    Non-medical: No  Tobacco Use  . Smoking status: Former Smoker    Packs/day: 1.00    Years: 10.00    Pack years: 10.00    Types: Cigarettes    Start date: 04/20/1995    Last attempt to quit: 04/19/2005    Years since quitting: 12.6  . Smokeless tobacco: Never Used  Substance and Sexual Activity  . Alcohol use: Yes    Alcohol/week: 10.0 standard drinks    Types: 10 Cans of beer per week    Comment: occasional  . Drug use: No  . Sexual activity: Yes    Partners: Female  Lifestyle  . Physical activity:    Days per week: 0 days    Minutes per session: 0 min  . Stress: Not at all  Relationships  . Social connections:    Talks on phone: More than three times a week    Gets together: Once a week    Attends religious service: Never    Active member of club or organization: No    Attends meetings of clubs  or organizations: Never    Relationship status: Married  . Intimate partner violence:    Fear of current or ex partner: No    Emotionally abused: No    Physically abused: No    Forced sexual activity: No  Other Topics Concern  . Not on file  Social History Narrative  . Not on file     Current Outpatient Medications:  .  acetaminophen (TYLENOL) 500 MG tablet, Take 1 tablet (500 mg total) by mouth every 6 (six) hours as needed., Disp: 90 tablet, Rfl: 0 .  albuterol (PROVENTIL HFA;VENTOLIN HFA) 108 (90 Base) MCG/ACT inhaler, Inhale 2 puffs into the lungs  4 (four) times daily., Disp: 1 Inhaler, Rfl: 0 .  aspirin 81 MG tablet, Take 81 mg by mouth daily., Disp: , Rfl:  .  atenolol (TENORMIN) 25 MG tablet, Take 1 tablet (25 mg total) by mouth every evening., Disp: 90 tablet, Rfl: 1 .  cetirizine (ZYRTEC) 10 MG tablet, Take 10 mg by mouth daily., Disp: , Rfl:  .  citalopram (CELEXA) 20 MG tablet, Take 1 tablet (20 mg total) by mouth daily., Disp: 90 tablet, Rfl: 1 .  diclofenac sodium (VOLTAREN) 1 % GEL, APPLY 4 GRAMS TOPICALLY FOUR TIMES A DAY, Disp: 100 g, Rfl: 2 .  fluticasone furoate-vilanterol (BREO ELLIPTA) 100-25 MCG/INH AEPB, Inhale 1 puff into the lungs daily., Disp: 180 each, Rfl: 1 .  glucose blood test strip, 1 strip by Other route 2 (two) times daily., Disp: , Rfl:  .  lisinopril (PRINIVIL,ZESTRIL) 40 MG tablet, Take 1 tablet (40 mg total) by mouth daily., Disp: 90 tablet, Rfl: 1 .  metFORMIN (GLUCOPHAGE) 1000 MG tablet, Take 1 tablet (1,000 mg total) by mouth 2 (two) times daily with a meal., Disp: 180 tablet, Rfl: 1 .  Multiple Vitamins-Minerals (MENS MULTIVITAMIN PLUS) TABS, Take by mouth., Disp: , Rfl:  .  Omega-3 Fatty Acids (FISH OIL) 1000 MG CAPS, Take by mouth daily., Disp: , Rfl:  .  rosuvastatin (CRESTOR) 20 MG tablet, Take 1 tablet (20 mg total) by mouth daily., Disp: 90 tablet, Rfl: 1 .  triamcinolone cream (KENALOG) 0.1 %, triamcinolone acetonide 0.1 % topical cream,  Disp: , Rfl:   Allergies  Allergen Reactions  . Lipitor [Atorvastatin] Hives and Itching  . Other   . Viagra  [Sildenafil Citrate]     vision loss     ROS  Constitutional: Negative for fever or weight change.  Respiratory: Negative for cough and shortness of breath.   Cardiovascular: Negative for chest pain or palpitations.  Gastrointestinal: Negative for abdominal pain, no bowel changes.  Musculoskeletal: positive  for gait problem and  joint swelling.  Skin: Negative for rash.  Neurological: Negative for dizziness or headache.  No other specific complaints in a complete review of systems (except as listed in HPI above).  Objective  Vitals:   12/02/17 1106  BP: 116/64  Pulse: 98  Resp: 18  Temp: 98 F (36.7 C)  TempSrc: Oral  SpO2: 97%  Weight: (!) 314 lb 4.8 oz (142.6 kg)  Height: 6\' 3"  (1.905 m)    Body mass index is 39.28 kg/m.  Physical Exam  Constitutional: Patient appears well-developed and well-nourished. Obese No distress.  HEENT: head atraumatic, normocephalic, pupils equal and reactive to light, neck supple, throat within normal limits Cardiovascular: Normal rate, regular rhythm and normal heart sounds.  No murmur heard. 1 plus  BLE edema. Pulmonary/Chest: Effort normal and breath sounds normal. No respiratory distress. Abdominal: Soft.  There is no tenderness. Psychiatric: Patient has a normal mood and affect. behavior is normal. Judgment and thought content normal. Muscular Skeletal: pain during palpation of dorsal aspect of both feet, not with lateral compression of metatarsal bones. He states pain started after he started to do calf exercises with his toes. Advised ice and voltaren gel, likely tendinitis.   Recent Results (from the past 2160 hour(s))  POCT HgB A1C     Status: Abnormal   Collection Time: 12/02/17 11:12 AM  Result Value Ref Range   Hemoglobin A1C 6.0 (A) 4.0 - 5.6 %   HbA1c POC (<> result, manual entry)  HbA1c, POC (prediabetic  range)     HbA1c, POC (controlled diabetic range)      Diabetic Foot Exam: Diabetic Foot Exam - Simple   Simple Foot Form Diabetic Foot exam was performed with the following findings:  Yes 12/02/2017 11:44 AM  Visual Inspection See comments:  Yes Sensation Testing Intact to touch and monofilament testing bilaterally:  Yes Pulse Check Posterior Tibialis and Dorsalis pulse intact bilaterally:  Yes Comments Dry feet, brittle nails - chronic       PHQ2/9: Depression screen [facility] 2/9 12/02/2017 07/13/2017 12/08/2016 07/30/2016 04/29/2016  Decreased Interest 0 0 0 0 0  Down, Depressed, Hopeless 0 0 0 0 0  PHQ - 2 Score 0 0 0 0 0  Altered sleeping 0 - - - -  Tired, decreased energy 1 - - - -  Change in appetite 0 - - - -  Feeling bad or failure about yourself  0 - - - -  Trouble concentrating 0 - - - -  Moving slowly or fidgety/restless 0 - - - -  Suicidal thoughts 0 - - - -  PHQ-9 Score 1 - - - -  Difficult doing work/chores Not difficult at all - - - -     Fall Risk: Fall Risk  12/02/2017 07/13/2017 04/08/2017 12/08/2016 07/30/2016  Falls in the past year? No No No No No     Functional Status Survey: Is the patient deaf or have difficulty hearing?: No Does the patient have difficulty seeing, even when wearing glasses/contacts?: Yes(glasses) Does the patient have difficulty concentrating, remembering, or making decisions?: No Does the patient have difficulty walking or climbing stairs?: No Does the patient have difficulty dressing or bathing?: No Does the patient have difficulty doing errands alone such as visiting a doctor's office or shopping?: No   Assessment & Plan  1. Type 2 diabetes mellitus with microalbuminuria, without long-term current use of insulin (HCC)  - POCT HgB A1C - lisinopril (PRINIVIL,ZESTRIL) 40 MG tablet; Take 1 tablet (40 mg total) by mouth daily.  Dispense: 90 tablet; Refill: 1 - metFORMIN (GLUCOPHAGE) 1000 MG tablet; Take 1 tablet (1,000 mg total) by  mouth 2 (two) times daily with a meal.  Dispense: 180 tablet; Refill: 1  2. Benign hypertension  - atenolol (TENORMIN) 25 MG tablet; Take 1 tablet (25 mg total) by mouth every evening.  Dispense: 90 tablet; Refill: 1 - lisinopril (PRINIVIL,ZESTRIL) 40 MG tablet; Take 1 tablet (40 mg total) by mouth daily.  Dispense: 90 tablet; Refill: 1  3. Depression with anxiety  - citalopram (CELEXA) 20 MG tablet; Take 1 tablet (20 mg total) by mouth daily.  Dispense: 90 tablet; Refill: 1  4. Diabetic polyneuropathy associated with type 2 diabetes mellitus (HCC)  - metFORMIN (GLUCOPHAGE) 1000 MG tablet; Take 1 tablet (1,000 mg total) by mouth 2 (two) times daily with a meal.  Dispense: 180 tablet; Refill: 1  5. Dyslipidemia  - rosuvastatin (CRESTOR) 20 MG tablet; Take 1 tablet (20 mg total) by mouth daily.  Dispense: 90 tablet; Refill: 1  6. COPD with asthma ([place])   7. Atherosclerosis of aorta ([place])   8. Coronary atherosclerosis due to calcified coronary lesion  Seen by Dr. Rockey Situ

## 2017-12-08 IMAGING — US ULTRASOUND ABDOMEN LIMITED
1 series · 14 of 25 positions shown · non-contrast
Comparison: CT AP [DATE]

CLINICAL DATA: Liver cirrhosis.  Alcohol withdrawal.

EXAM:
ULTRASOUND ABDOMEN LIMITED RIGHT UPPER QUADRANT

[Series 1: ultrasound abdomen limited · 0.28mm/px · 14 of 52 slices shown]
[im 1/52]
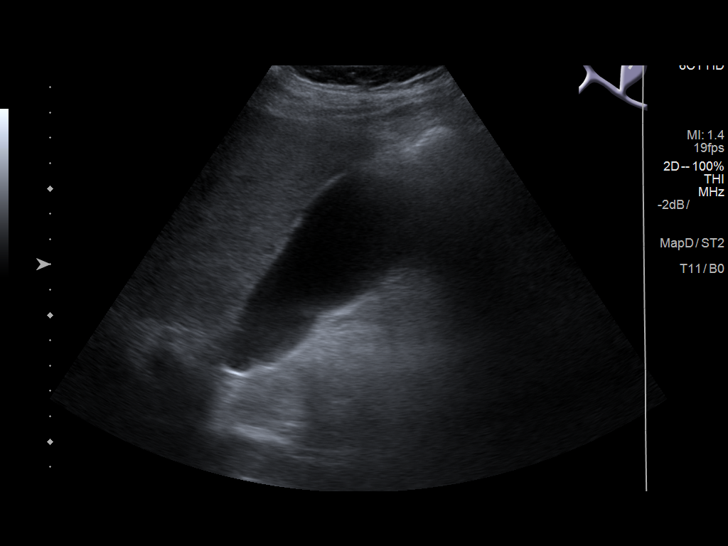
[im 5/52]
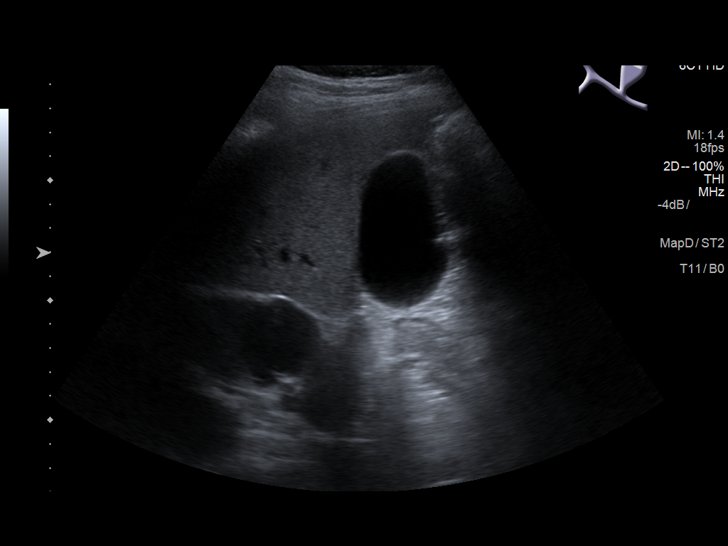
[im 9/52]
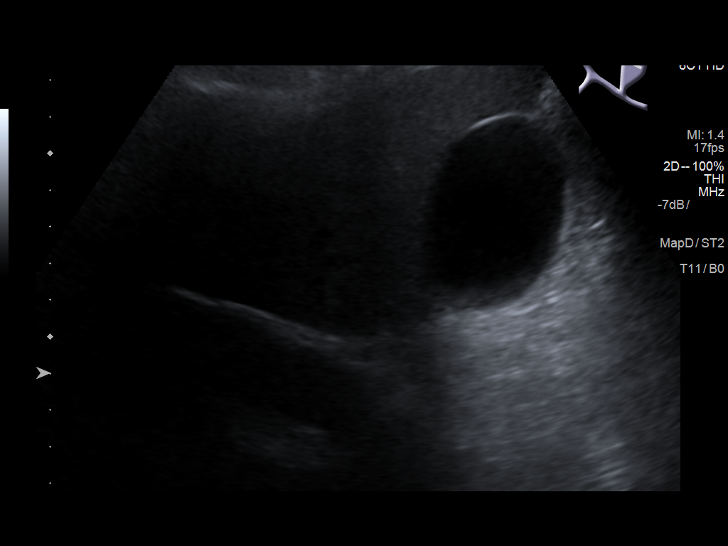
[im 13/52]
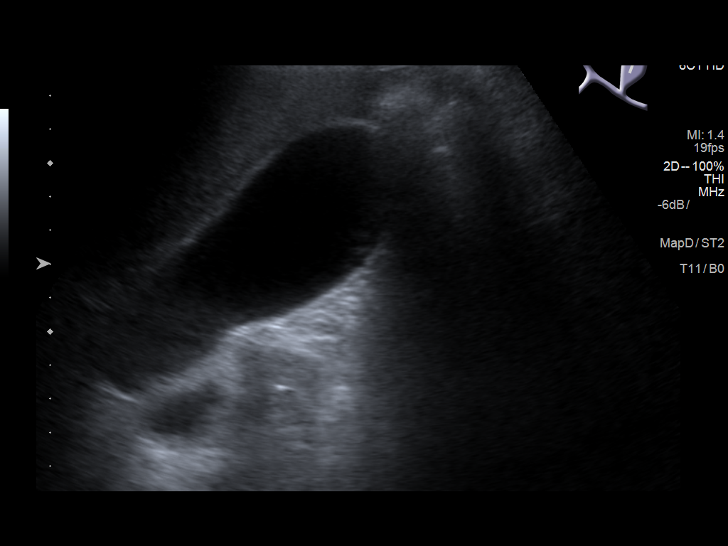
[im 18/52]
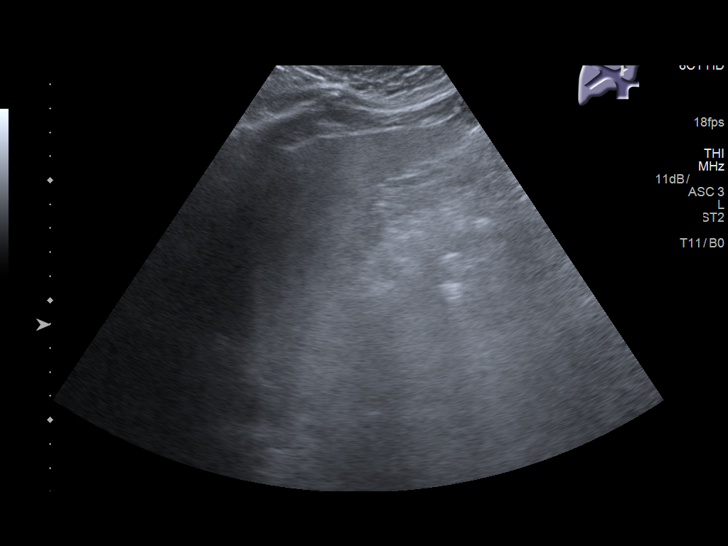
[im 20/52]
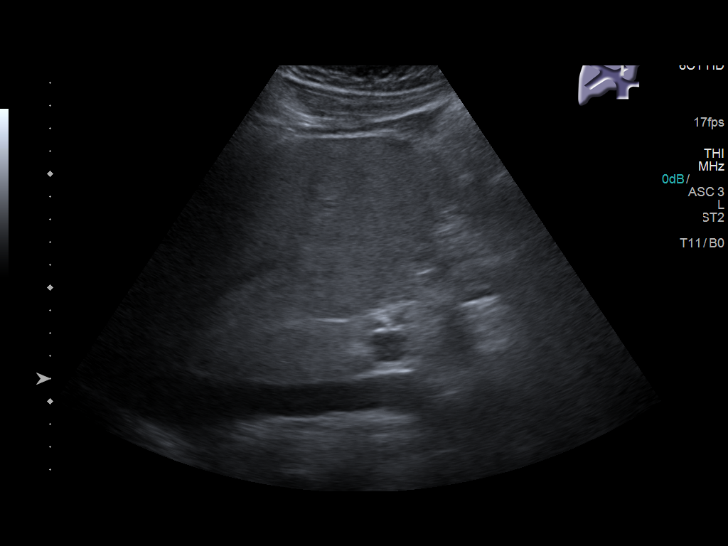
[im 24/52]
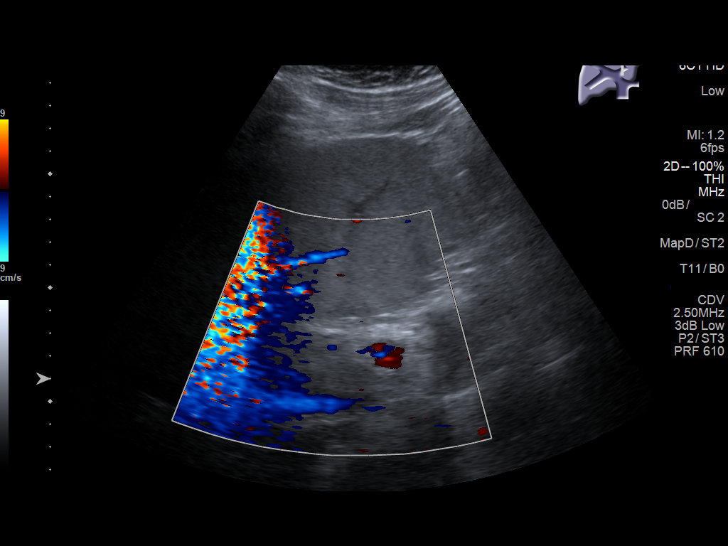
[im 28/52]
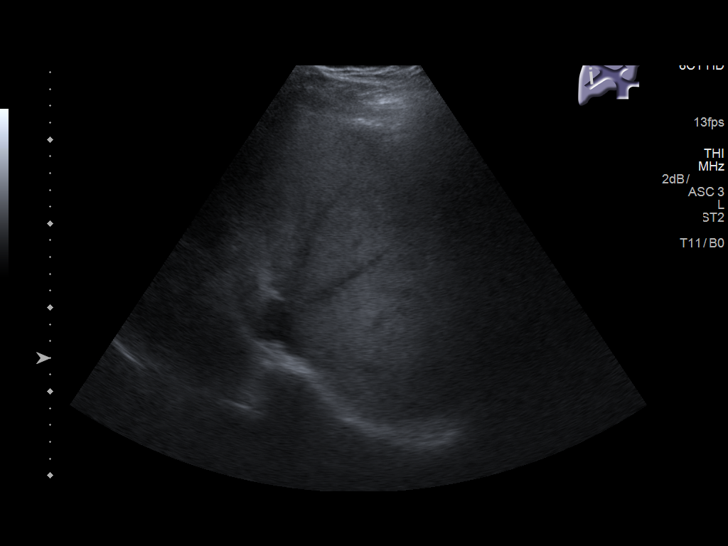
[im 32/52]
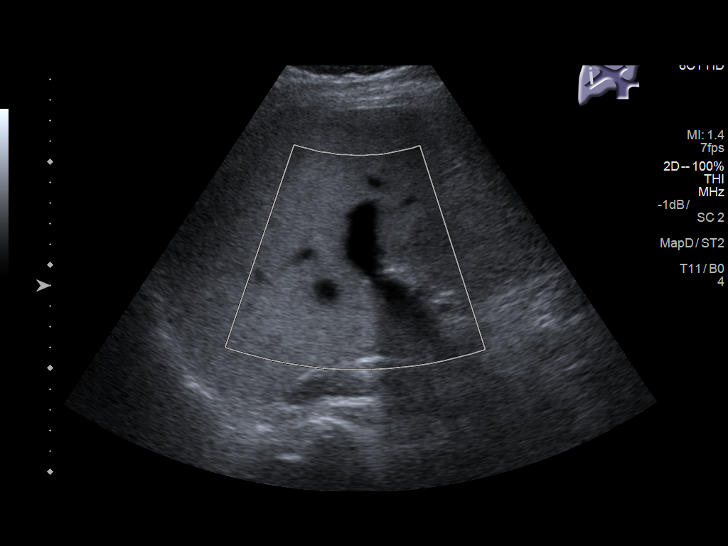
[im 35/52]
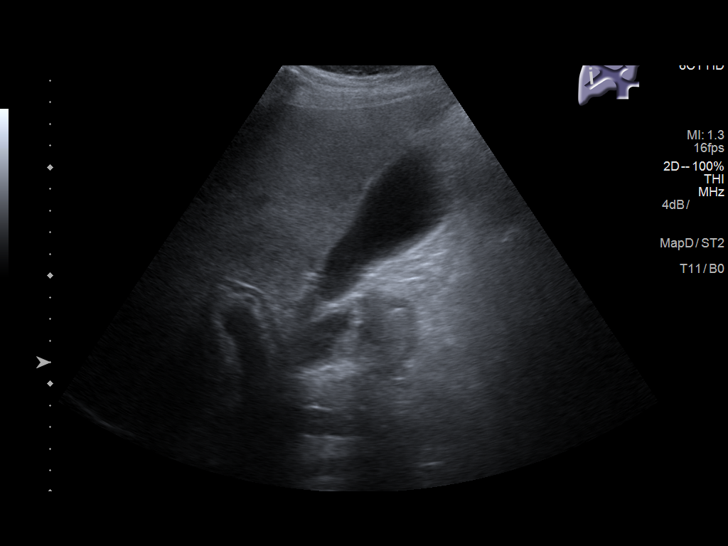
[im 39/52]
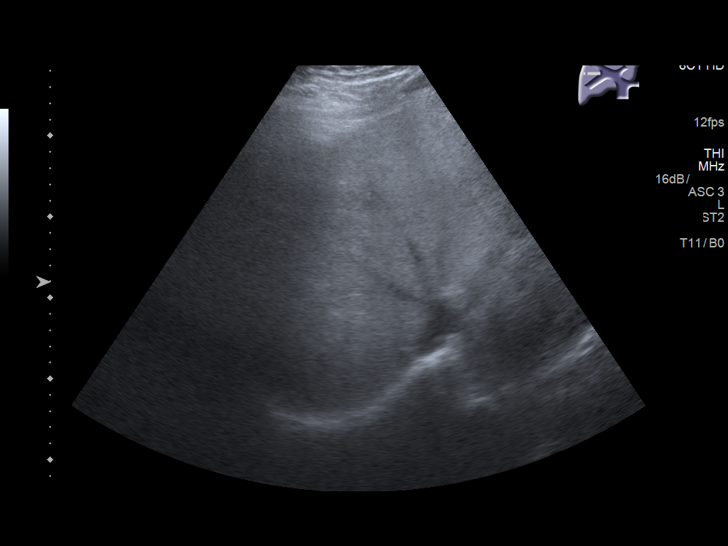
[im 43/52]
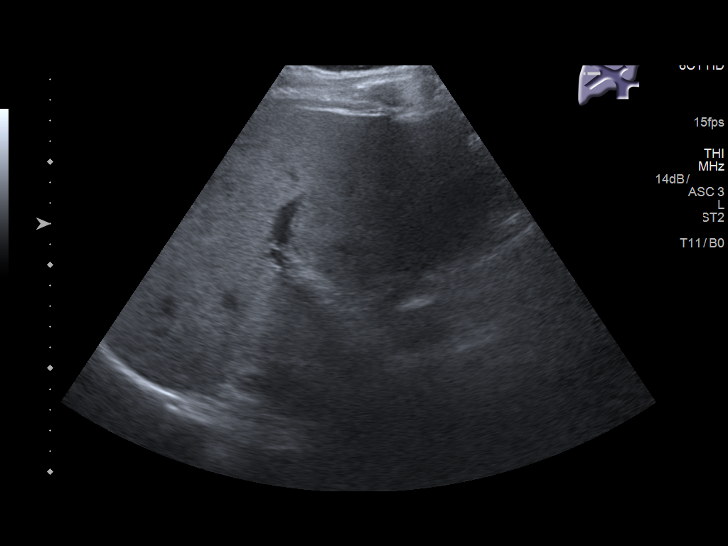
[im 47/52]
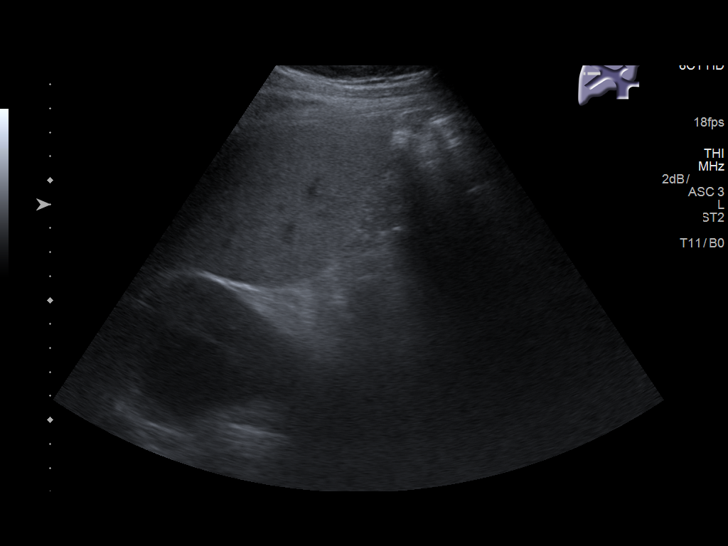
[im 52/52]
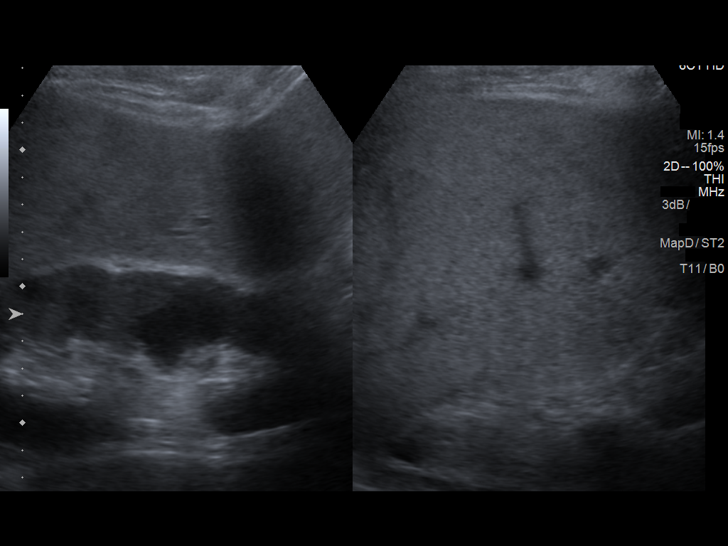

[14 of 25 positions shown; findings below may reference images not displayed]

FINDINGS: Gallbladder:

Gallbladder sludge noted. No gallstones, wall thickening or
sonographic Murphy's sign.

Common bile duct:

Diameter: 5 mm

Liver:

The liver is diffusely echogenic compatible with hepatic steatosis.
No focal liver lesion. Portal vein is patent on color Doppler
imaging with normal direction of blood flow towards the liver.
IMPRESSION: 1. Echogenic liver compatible with hepatic steatosis.
2. Gallbladder sludge.

## 2018-03-06 ENCOUNTER — Ambulatory Visit: Payer: BC Managed Care – PPO | Admitting: Family Medicine

## 2018-03-06 ENCOUNTER — Encounter: Payer: Self-pay | Admitting: Family Medicine

## 2018-03-06 VITALS — BP 114/76 | HR 93 | Temp 98.2°F | Resp 16 | Ht 75.0 in | Wt 316.9 lb

## 2018-03-06 DIAGNOSIS — Z23 Encounter for immunization: Secondary | ICD-10-CM

## 2018-03-06 DIAGNOSIS — I10 Essential (primary) hypertension: Secondary | ICD-10-CM

## 2018-03-06 DIAGNOSIS — R809 Proteinuria, unspecified: Secondary | ICD-10-CM | POA: Diagnosis not present

## 2018-03-06 DIAGNOSIS — J449 Chronic obstructive pulmonary disease, unspecified: Secondary | ICD-10-CM

## 2018-03-06 DIAGNOSIS — I700 Atherosclerosis of aorta: Secondary | ICD-10-CM

## 2018-03-06 DIAGNOSIS — E1129 Type 2 diabetes mellitus with other diabetic kidney complication: Secondary | ICD-10-CM | POA: Diagnosis not present

## 2018-03-06 DIAGNOSIS — J4489 Other specified chronic obstructive pulmonary disease: Secondary | ICD-10-CM

## 2018-03-06 DIAGNOSIS — I2510 Atherosclerotic heart disease of native coronary artery without angina pectoris: Secondary | ICD-10-CM

## 2018-03-06 DIAGNOSIS — E785 Hyperlipidemia, unspecified: Secondary | ICD-10-CM

## 2018-03-06 DIAGNOSIS — M2351 Chronic instability of knee, right knee: Secondary | ICD-10-CM

## 2018-03-06 DIAGNOSIS — I2584 Coronary atherosclerosis due to calcified coronary lesion: Secondary | ICD-10-CM

## 2018-03-06 DIAGNOSIS — G4733 Obstructive sleep apnea (adult) (pediatric): Secondary | ICD-10-CM

## 2018-03-06 DIAGNOSIS — Z9989 Dependence on other enabling machines and devices: Secondary | ICD-10-CM

## 2018-03-06 DIAGNOSIS — M170 Bilateral primary osteoarthritis of knee: Secondary | ICD-10-CM

## 2018-03-06 DIAGNOSIS — E1142 Type 2 diabetes mellitus with diabetic polyneuropathy: Secondary | ICD-10-CM | POA: Diagnosis not present

## 2018-03-06 LAB — POCT GLYCOSYLATED HEMOGLOBIN (HGB A1C): HbA1c, POC (controlled diabetic range): 6.4 % (ref 0.0–7.0)

## 2018-03-06 MED ORDER — ALBUTEROL SULFATE HFA 108 (90 BASE) MCG/ACT IN AERS: 2.0000 | INHALATION_SPRAY | Freq: Four times a day (QID) | RESPIRATORY_TRACT | 0 refills | Status: DC
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
## 2018-03-07 LAB — MICROALBUMIN / CREATININE URINE RATIO
Creatinine, Urine: 222 mg/dL (ref 20–320)
MICROALB/CREAT RATIO: 45 ug/mg{creat} — AB (ref <30)
Microalb, Ur: 9.9 mg/dL

## 2018-03-15 ENCOUNTER — Ambulatory Visit: Payer: BC Managed Care – PPO | Admitting: Family Medicine

## 2018-03-15 ENCOUNTER — Encounter: Payer: Self-pay | Admitting: Family Medicine

## 2018-03-15 VITALS — BP 110/80 | HR 100 | Temp 98.3°F | Resp 16 | Ht 75.0 in | Wt 318.4 lb

## 2018-03-15 DIAGNOSIS — J358 Other chronic diseases of tonsils and adenoids: Secondary | ICD-10-CM

## 2018-03-15 MED ORDER — LIDOCAINE VISCOUS HCL 2 % MT SOLN: 15.0000 mL | OROMUCOSAL | 0 refills | Status: DC | PRN

## 2018-03-15 MED ORDER — FIRST-DUKES MOUTHWASH MT SUSP: 15.0000 mL | Freq: Three times a day (TID) | OROMUCOSAL | 0 refills | Status: DC

## 2018-03-15 NOTE — Progress Notes (Signed)
Name: Isaac Cross   MRN: [id number]    DOB: 08-20-1960   Date:03/15/2018       Progress Note  Subjective  Chief Complaint  Chief Complaint  Patient presents with  . Isaac Cross    has some white spots on left tonsil.    HPI  Tonsil Lith: he noticed two white bumps on left tonsil one week ago, he has been gargling with warm salted water and one of the spots resolved. No fever, chills or change in appetite. No rashes. No halitosis.   Patient Active Problem List   Diagnosis Date Noted  . Coronary atherosclerosis due to calcified coronary lesion 12/02/2017  . Atherosclerosis of abdominal aorta ([place]) 06/17/2015  . Fatty liver disease, nonalcoholic 48/54/6270  . Renal cyst, right 06/17/2015  . BPH (benign prostatic hyperplasia) 05/25/2015  . Microscopic hematuria 05/25/2015  . Second degree hemorrhoids   . Diverticulosis of large intestine without diverticulitis   . Asthma, mild intermittent, well-controlled 10/16/2014  . Allergic rhinitis 10/13/2014  . ED (erectile dysfunction) of organic origin 07/22/2014  . Gastro-esophageal reflux disease without esophagitis 07/22/2014  . Benign hypertension 07/22/2014  . 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[place]) 07/22/2014  . Adult BMI 30+ 07/22/2014  . Chronic obstructive asthma ([place]) 07/22/2014  . Depression with anxiety 07/22/2014  . Dyslipidemia 07/22/2014  . Genital herpes 07/22/2014  . 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 Osteoarthritis of both knees 07/22/2014  . Obstructive apnea 05/14/2013  . 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 Blood in the urine 09/20/2006    Social History   Tobacco Use  . Smoking status: Former Smoker    Packs/day: 1.00    Years: 10.00    Pack years: 10.00    Types: Cigarettes    Start date: 04/20/1995    Last attempt to quit: 04/19/2005    Years since quitting: 12.9  . Smokeless tobacco: Never Used  Substance Use Topics  . Alcohol use: Yes    Alcohol/week: 10.0 standard drinks     Types: 10 Cans of beer per week    Comment: occasional     Current Outpatient Medications:  .  acetaminophen (TYLENOL) 500 MG tablet, Take 1 tablet (500 mg total) by mouth every 6 (six) hours as needed., Disp: 90 tablet, Rfl: 0 .  albuterol (PROVENTIL HFA;VENTOLIN HFA) 108 (90 Base) MCG/ACT inhaler, Inhale 2 puffs into the lungs 4 (four) times daily., Disp: 1 Inhaler, Rfl: 0 .  aspirin 81 MG tablet, Take 81 mg by mouth daily., Disp: , Rfl:  .  atenolol (TENORMIN) 25 MG tablet, Take 1 tablet (25 mg total) by mouth every evening., Disp: 90 tablet, Rfl: 1 .  cetirizine (ZYRTEC) 10 MG tablet, Take 10 mg by mouth daily., Disp: , Rfl:  .  citalopram (CELEXA) 20 MG tablet, Take 1 tablet (20 mg total) by mouth daily., Disp: 90 tablet, Rfl: 1 .  diclofenac sodium (VOLTAREN) 1 % GEL, APPLY 4 GRAMS TOPICALLY FOUR TIMES A DAY, Disp: 100 g, Rfl: 2 .  fluticasone furoate-vilanterol (BREO ELLIPTA) 100-25 MCG/INH AEPB, Inhale 1 puff into the lungs daily., Disp: 180 each, Rfl: 1 .  glucose blood test strip, 1 strip by Other route 2 (two) times daily., Disp: , Rfl:  .  lisinopril (PRINIVIL,ZESTRIL) 40 MG tablet, Take 1 tablet (40 mg total) by mouth daily., Disp: 90 tablet, Rfl: 1 .  metFORMIN (GLUCOPHAGE) 1000 MG tablet, Take 1 tablet (1,000 mg total) by mouth 2 (two) times daily with a meal.,  Disp: 180 tablet, Rfl: 1 .  Multiple Vitamins-Minerals (MENS MULTIVITAMIN PLUS) TABS, Take by mouth., Disp: , Rfl:  .  Omega-3 Fatty Acids (FISH OIL) 1000 MG CAPS, Take by mouth daily., Disp: , Rfl:  .  rosuvastatin (CRESTOR) 20 MG tablet, Take 1 tablet (20 mg total) by mouth daily., Disp: 90 tablet, Rfl: 1 .  triamcinolone cream (KENALOG) 0.1 %, triamcinolone acetonide 0.1 % topical cream, Disp: , Rfl:   Allergies  Allergen Reactions  . Lipitor [Atorvastatin] Hives and Itching  . Other   . Viagra  [Sildenafil Citrate]     vision loss    ROS  Ten systems reviewed and is negative except as mentioned in HPI    Objective  Vitals:   03/15/18 0832  BP: 110/80  Pulse: 100  Resp: 16  Temp: 98.3 F (36.8 C)  TempSrc: Oral  SpO2: 98%  Weight: (!) 318 lb 6.4 oz (144.4 kg)  Height: 6\' 3"  (1.905 m)    Body mass index is 39.8 kg/m.    Physical Exam  Constitutional: Patient appears well-developed and muscular.. Obese No distress.  HEENT: head atraumatic, normocephalic, pupils equal and reactive to light,neck supple, small tonsils, small lith on left side, no redness  Cardiovascular: Normal rate, regular rhythm and normal heart sounds.  No murmur heard. No BLE edema. Pulmonary/Chest: Effort normal and breath sounds normal. No respiratory distress. Abdominal: Soft.  There is no tenderness. Psychiatric: Patient has a normal mood and affect. behavior is normal. Judgment and thought content normal.  Recent Results (from the past 2160 hour(s))  POCT HgB A1C     Status: Normal   Collection Time: 03/06/18 10:22 AM  Result Value Ref Range   Hemoglobin A1C     HbA1c POC (<> result, manual entry)     HbA1c, POC (prediabetic range)     HbA1c, POC (controlled diabetic range) 6.4 0.0 - 7.0 %  Urine Microalbumin w/creat. ratio     Status: Abnormal   Collection Time: 03/06/18 11:34 AM  Result Value Ref Range   Creatinine, Urine 222 20 - 320 mg/dL   Microalb, Ur 9.9 mg/dL    Comment: Reference Range Not established    Microalb Creat Ratio 45 (H) <30 mcg/mg creat    Comment: . The ADA defines abnormalities in albumin excretion as follows: [place] Kitchen Category         Result (mcg/mg creatinine) . Normal                    <30 Microalbuminuria         30-299  Clinical albuminuria   > OR = 300 . The ADA recommends that at least two of three specimens collected within a 3-6 month period be abnormal before considering a patient to be within a diagnostic category.      Assessment & Plan  1. Tonsil stone  Gave reassurance, advised to gargle and if continues to bother him or does not clear we can  send him to ENT We tried to removed in the office without success, we will send him home with lidocaine gargle if he develops pain later today   One or the other - Diphenhyd-Hydrocort-Nystatin (FIRST-DUKES MOUTHWASH) SUSP; Use as directed 15 mLs in the mouth or throat 4 (four) times daily -  before meals and at bedtime.  Dispense: 237 mL; Refill: 0 - lidocaine (XYLOCAINE) 2 % solution; Use as directed 15 mLs in the mouth or throat as needed for mouth pain.  Dispense:  100 mL; Refill: 0

## 2018-03-21 ENCOUNTER — Encounter: Payer: Self-pay | Admitting: Family Medicine

## 2018-03-21 LAB — HM DIABETES EYE EXAM

## 2018-03-31 ENCOUNTER — Other Ambulatory Visit: Payer: Self-pay | Admitting: Family Medicine

## 2018-03-31 DIAGNOSIS — J449 Chronic obstructive pulmonary disease, unspecified: Secondary | ICD-10-CM

## 2018-03-31 NOTE — Telephone Encounter (Signed)
Refill request for general medication. Albuterol to [facility]   Last office visit 03/06/2018   Follow up on 06/07/2018

## 2018-04-21 ENCOUNTER — Encounter: Payer: Self-pay | Admitting: Family Medicine

## 2018-05-10 ENCOUNTER — Other Ambulatory Visit: Payer: Self-pay | Admitting: Family Medicine

## 2018-05-10 DIAGNOSIS — E785 Hyperlipidemia, unspecified: Secondary | ICD-10-CM

## 2018-05-10 NOTE — Telephone Encounter (Signed)
Refill request for general medication. Crestor  Last office visit 03/06/2018   Follow up on 06/07/2018

## 2018-05-22 ENCOUNTER — Encounter: Payer: Self-pay | Admitting: Cardiovascular Disease

## 2018-05-25 ENCOUNTER — Encounter: Payer: Self-pay | Admitting: Family Medicine

## 2018-06-07 ENCOUNTER — Ambulatory Visit: Payer: BC Managed Care – PPO | Admitting: Family Medicine

## 2018-06-07 ENCOUNTER — Encounter: Payer: Self-pay | Admitting: Family Medicine

## 2018-06-07 VITALS — BP 130/78 | HR 100 | Temp 98.1°F | Resp 16 | Ht 75.0 in | Wt 318.8 lb

## 2018-06-07 DIAGNOSIS — I10 Essential (primary) hypertension: Secondary | ICD-10-CM

## 2018-06-07 DIAGNOSIS — F418 Other specified anxiety disorders: Secondary | ICD-10-CM

## 2018-06-07 DIAGNOSIS — I700 Atherosclerosis of aorta: Secondary | ICD-10-CM | POA: Diagnosis not present

## 2018-06-07 DIAGNOSIS — I2510 Atherosclerotic heart disease of native coronary artery without angina pectoris: Secondary | ICD-10-CM

## 2018-06-07 DIAGNOSIS — E1129 Type 2 diabetes mellitus with other diabetic kidney complication: Secondary | ICD-10-CM

## 2018-06-07 DIAGNOSIS — R809 Proteinuria, unspecified: Secondary | ICD-10-CM

## 2018-06-07 DIAGNOSIS — J449 Chronic obstructive pulmonary disease, unspecified: Secondary | ICD-10-CM | POA: Diagnosis not present

## 2018-06-07 DIAGNOSIS — Z01818 Encounter for other preprocedural examination: Secondary | ICD-10-CM

## 2018-06-07 DIAGNOSIS — G4733 Obstructive sleep apnea (adult) (pediatric): Secondary | ICD-10-CM

## 2018-06-07 DIAGNOSIS — E785 Hyperlipidemia, unspecified: Secondary | ICD-10-CM

## 2018-06-07 DIAGNOSIS — J069 Acute upper respiratory infection, unspecified: Secondary | ICD-10-CM

## 2018-06-07 DIAGNOSIS — E1142 Type 2 diabetes mellitus with diabetic polyneuropathy: Secondary | ICD-10-CM

## 2018-06-07 DIAGNOSIS — M170 Bilateral primary osteoarthritis of knee: Secondary | ICD-10-CM

## 2018-06-07 LAB — POCT GLYCOSYLATED HEMOGLOBIN (HGB A1C): HEMOGLOBIN A1C: 6.8 % — AB (ref 4.0–5.6)

## 2018-06-07 MED ORDER — LISINOPRIL 40 MG PO TABS: 40.0000 mg | ORAL_TABLET | Freq: Every day | ORAL | 1 refills | Status: DC

## 2018-06-07 MED ORDER — ATENOLOL 25 MG PO TABS: 25.0000 mg | ORAL_TABLET | Freq: Every evening | ORAL | 1 refills | Status: DC

## 2018-06-07 MED ORDER — CITALOPRAM HYDROBROMIDE 20 MG PO TABS: 20.0000 mg | ORAL_TABLET | Freq: Every day | ORAL | 1 refills | Status: DC

## 2018-06-07 MED ORDER — BENZONATATE 100 MG PO CAPS: 100.0000 mg | ORAL_CAPSULE | Freq: Two times a day (BID) | ORAL | 0 refills | Status: DC | PRN

## 2018-06-07 MED ORDER — METFORMIN HCL 1000 MG PO TABS: 1000.0000 mg | ORAL_TABLET | Freq: Two times a day (BID) | ORAL | 1 refills | Status: DC

## 2018-06-07 NOTE — Progress Notes (Signed)
Name: Isaac Cross   MRN: [id number]    DOB: 09/30/1960   Date:06/07/2018       Progress Note  Subjective  Chief Complaint  Chief Complaint  Patient presents with  . Asthma  . Diabetes  . Medical Clearance    Needs clearance for knee replacement surgery.    HPI  Bilateral Knee Pain/Pre op clearance  : he has long history of OA of both knees, he is finally going to have both knees replaced at the same time by Dr. Harlow Mares on March 20 th, 2020 in [place]. He daily aching pain on both knees. He states the pain affects his quality of life, the worse feeling now is the instability, feels like his knees cannot support him. He had surgery in the past and never had complications from anesthetics. He has a crown on right front incisor.He has good exercise tolerance. He states not able to do things because of pain not because of lack of exercise tolerance. He has coronary calcification but evaluated by Dr. Rockey Situ last year and was given reassurance.   URI: he developed nose congestion, rhinorrhea 5 days ago and after a couple of days it went down to his chest with chest congestion, productive cough but no wheezing or sob. He will call back for a Zpack if no improvement in the next few days since he will have surgery in one month   COPD/Asthma:usually mild and controlled, coughing now because of URI. He has Breo every other day but advised to increase to daily since he has an URI He smoked one pack daily for 15 years but quit may years ago. He had CT chest done and it showed very small nodules, repeat for 3 more years and stop, next one due April 2020.   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   Patient Active Problem List   Diagnosis Date Noted  . Coronary atherosclerosis due to calcified coronary lesion 12/02/2017  . Atherosclerosis of abdominal aorta ([place]) 06/17/2015  . Fatty liver disease, nonalcoholic 74/03/8785  . Renal cyst, right 06/17/2015  . BPH (benign prostatic hyperplasia) 05/25/2015  . Microscopic hematuria 05/25/2015  . Second degree hemorrhoids   . Diverticulosis of large intestine without diverticulitis   . Asthma, mild intermittent, well-controlled 10/16/2014  . Allergic rhinitis 10/13/2014  . ED (erectile dysfunction) of organic origin 07/22/2014  . Gastro-esophageal reflux disease without esophagitis 07/22/2014  . Benign hypertension 07/22/2014  . 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[place]) 07/22/2014  . Adult BMI 30+ 07/22/2014  . Chronic obstructive asthma ([place]) 07/22/2014  . Depression with anxiety 07/22/2014  . Dyslipidemia 07/22/2014  . Genital herpes 07/22/2014  . 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 Osteoarthritis of both knees 07/22/2014  . Obstructive apnea 05/14/2013  .  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 Blood in the urine 09/20/2006    Past Surgical History:  Procedure Laterality Date  . BLADDER REPAIR    . COLONOSCOPY WITH PROPOFOL N/A 04/07/2015   Procedure: COLONOSCOPY WITH PROPOFOL;  Surgeon: Lucilla Lame, MD;  Location: [facility];  Service: Endoscopy;  Laterality: N/A;  Diabetic - oral meds CPAP  . HAND SURGERY     4TH AND 5TH FINGER  . KNEE ARTHROSCOPY Right   . VEIN SURGERY Right     Family History   Problem Relation Age of Onset  . Hypertension Mother   . Diabetes Mother   . Diabetes Father   . Hypertension Father   . Cancer Father   . Seizures Daughter   . Lupus Daughter   . Cancer Maternal Grandfather        Prostate  . Diabetes Paternal Grandfather   . Prostate cancer Maternal Uncle   . Bladder Cancer Neg Hx   . Kidney cancer Neg Hx     Social History   Socioeconomic History  . Marital status: Married    Spouse name: Not on file  . Number of children: 2  . Years of education: Not on file  . Highest education level: Bachelor's degree (e.g., BA, AB, BS)  Occupational History  . Occupation: case Freight forwarder   Social Needs  . Financial resource strain: Not hard at all  . Food insecurity:    Worry: Never true    Inability: Never true  . Transportation needs:    Medical: No    Non-medical: No  Tobacco Use  . Smoking status: Former Smoker    Packs/day: 1.00    Years: 10.00    Pack years: 10.00    Types: Cigarettes    Start date: 04/20/1995    Last attempt to quit: 04/19/2005    Years since quitting: 13.1  . Smokeless tobacco: Never Used  Substance and Sexual Activity  . Alcohol use: Yes    Alcohol/week: 10.0 standard drinks    Types: 10 Cans of beer per week    Comment: occasional  . Drug use: No  . Sexual activity: Yes    Partners: Female  Lifestyle  . Physical activity:    Days per week: 0 days    Minutes per session: 0 min  . Stress: Not at all  Relationships  . Social connections:    Talks on phone: More than three times a week    Gets together: Once a week    Attends religious service: Never    Active member of club or organization: No    Attends meetings of clubs or organizations: Never    Relationship status: Married  . Intimate partner violence:    Fear of current or ex partner: No    Emotionally abused: No    Physically abused: No    Forced sexual activity: No  Other Topics Concern  . Not on file  Social History Narrative  . Not on file      Current Outpatient Medications:  .  acetaminophen (TYLENOL) 500 MG tablet, Take 1 tablet (500 mg total) by mouth every 6 (six) hours as needed., Disp: 90 tablet, Rfl: 0 .  albuterol (PROVENTIL HFA;VENTOLIN HFA) 108 (90 Base) MCG/ACT inhaler, INHALE 2 PUFFS INTO THE LUNGS 4 (FOUR) TIMES DAILY., Disp: 6.7 Inhaler, Rfl: 0 .  aspirin 81 MG tablet, Take 81 mg by mouth daily., Disp: , Rfl:  .  atenolol (TENORMIN) 25 MG tablet, Take 1  tablet (25 mg total) by mouth every evening., Disp: 90 tablet, Rfl: 1 .  cetirizine (ZYRTEC) 10 MG tablet, Take 10 mg by mouth daily., Disp: , Rfl:  .  citalopram (CELEXA) 20 MG tablet, Take 1 tablet (20 mg total) by mouth daily., Disp: 90 tablet, Rfl: 1 .  diclofenac sodium (VOLTAREN) 1 % GEL, APPLY 4 GRAMS TOPICALLY FOUR TIMES A DAY, Disp: 100 g, Rfl: 2 .  Diphenhyd-Hydrocort-Nystatin (FIRST-DUKES MOUTHWASH) SUSP, Use as directed 15 mLs in the mouth or throat 4 (four) times daily -  before meals and at bedtime., Disp: 237 mL, Rfl: 0 .  fluticasone furoate-vilanterol (BREO ELLIPTA) 100-25 MCG/INH AEPB, Inhale 1 puff into the lungs daily., Disp: 180 each, Rfl: 1 .  glucose blood test strip, 1 strip by Other route 2 (two) times daily., Disp: , Rfl:  .  lidocaine (XYLOCAINE) 2 % solution, Use as directed 15 mLs in the mouth or throat as needed for mouth pain., Disp: 100 mL, Rfl: 0 .  lisinopril (PRINIVIL,ZESTRIL) 40 MG tablet, Take 1 tablet (40 mg total) by mouth daily., Disp: 90 tablet, Rfl: 1 .  metFORMIN (GLUCOPHAGE) 1000 MG tablet, Take 1 tablet (1,000 mg total) by mouth 2 (two) times daily with a meal., Disp: 180 tablet, Rfl: 1 .  Multiple Vitamins-Minerals (MENS MULTIVITAMIN PLUS) TABS, Take by mouth., Disp: , Rfl:  .  Omega-3 Fatty Acids (FISH OIL) 1000 MG CAPS, Take by mouth daily., Disp: , Rfl:  .  rosuvastatin (CRESTOR) 20 MG tablet, TAKE 1 TABLET BY MOUTH EVERY DAY, Disp: 90 tablet, Rfl: 1 .  triamcinolone cream (KENALOG) 0.1 %, triamcinolone acetonide 0.1 %  topical cream, Disp: , Rfl:   Allergies  Allergen Reactions  . Lipitor [Atorvastatin] Hives and Itching  . Other   . Viagra  [Sildenafil Citrate]     vision loss    I personally reviewed active problem list, medication list, allergies, family history, social history with the patient/caregiver today.   ROS  Constitutional: Negative for fever or weight change.  Respiratory: Positive  for cough but no  shortness of breath.   Cardiovascular: Negative for chest pain or palpitations.  Gastrointestinal: Negative for abdominal pain, no bowel changes.  Musculoskeletal: Negative for gait problem or joint swelling.  Skin: Negative for rash.  Neurological: Negative for dizziness or headache.  No other specific complaints in a complete review of systems (except as listed in HPI above).  Objective  Vitals:   06/07/18 0904  BP: 130/78  Pulse: 100  Resp: 16  Temp: 98.1 F (36.7 C)  TempSrc: Oral  SpO2: 98%  Weight: (!) 318 lb 12.8 oz (144.6 kg)  Height: 6\' 3"  (1.905 m)    Body mass index is 39.85 kg/m.  Physical Exam  Constitutional: Patient appears well-developed and well-nourished. Obese No distress.  HEENT: head atraumatic, normocephalic, pupils equal and reactive to light, ears normal TM bilaterally,  neck supple, throat within normal limits Cardiovascular: Normal rate, regular rhythm and normal heart sounds.  No murmur heard. No BLE edema. Pulmonary/Chest: Effort normal and breath sounds normal. No respiratory distress. Abdominal: Soft.  There is no tenderness. Psychiatric: Patient has a normal mood and affect. behavior is normal. Judgment and thought content normal. Muscular Skeletal: crepitus with extension of both knees right more than left   Recent Results (from the past 2160 hour(s))  HM DIABETES EYE EXAM     Status: None   Collection Time: 03/21/18 12:00 AM  Result Value Ref Range   HM  Diabetic Eye Exam No Retinopathy No Retinopathy    Comment: [facility] Dr.  [place] Dana f/u in 12 months  POCT HgB A1C     Status: Abnormal   Collection Time: 06/07/18  9:07 AM  Result Value Ref Range   Hemoglobin A1C 6.8 (A) 4.0 - 5.6 %   HbA1c POC (<> result, manual entry)     HbA1c, POC (prediabetic range)     HbA1c, POC (controlled diabetic range)      PHQ2/9: Depression screen [facility] 2/9 03/06/2018 12/02/2017 07/13/2017 12/08/2016 07/30/2016  Decreased Interest 0 0 0 0 0  Down, Depressed, Hopeless 0 0 0 0 0  PHQ - 2 Score 0 0 0 0 0  Altered sleeping 0 0 - - -  Tired, decreased energy 0 1 - - -  Change in appetite 0 0 - - -  Feeling bad or failure about yourself  0 0 - - -  Trouble concentrating 0 0 - - -  Moving slowly or fidgety/restless 0 0 - - -  Suicidal thoughts 0 0 - - -  PHQ-9 Score 0 1 - - -  Difficult doing work/chores Not difficult at all Not difficult at all - - -     Fall Risk: Fall Risk  06/07/2018 03/15/2018 03/06/2018 12/02/2017 07/13/2017  Falls in the past year? 0 0 0 No No  Number falls in past yr: 0 - 0 - -  Injury with Fall? 0 - 0 - -     Assessment & Plan    1. Type 2 diabetes mellitus with microalbuminuria, without long-term current use of insulin (HCC)  - POCT HgB A1C - lisinopril (PRINIVIL,ZESTRIL) 40 MG tablet; Take 1 tablet (40 mg total) by mouth daily.  Dispense: 90 tablet; Refill: 1 - metFORMIN (GLUCOPHAGE) 1000 MG tablet; Take 1 tablet (1,000 mg total) by mouth 2 (two) times daily with a meal.  Dispense: 180 tablet; Refill: 1  2. COPD with asthma ([place])  - benzonatate (TESSALON) 100 MG capsule; Take 1-2 capsules (100-200 mg total) by mouth 2 (two) times daily as needed.  Dispense: 40 capsule; Refill: 0  3. Atherosclerosis of aorta ([place])   4. Dyslipidemia   5. Benign hypertension  - atenolol (TENORMIN) 25 MG tablet; Take 1 tablet (25 mg total) by mouth every evening.  Dispense: 90 tablet; Refill: 1 - lisinopril (PRINIVIL,ZESTRIL) 40 MG tablet; Take 1 tablet (40 mg total) by mouth daily.  Dispense: 90 tablet;  Refill: 1  6. Primary osteoarthritis of both knees   7. Obstructive apnea   8. Diabetic polyneuropathy associated with type 2 diabetes mellitus (HCC)  - metFORMIN (GLUCOPHAGE) 1000 MG tablet; Take 1 tablet (1,000 mg total) by mouth 2 (two) times daily with a meal.  Dispense: 180 tablet; Refill: 1  9. Coronary artery calcification seen on CAT scan  Seen by Dr. Rockey Situ in 08/2017 and given reassurance   10. Depression with anxiety  - citalopram (CELEXA) 20 MG tablet; Take 1 tablet (20 mg total) by mouth daily.  Dispense: 90 tablet; Refill: 1  11. URI, acute  He will call back if not better in a few days   12. Pre-op examination  May proceed to surgery, hold metformin day of surgery if needed two days prior  Take bp medication as scheduled the am of surgery with a sip of water

## 2018-06-08 ENCOUNTER — Encounter: Payer: Self-pay | Admitting: Family Medicine

## 2018-06-08 NOTE — Addendum Note (Signed)
Addended by: Chilton Greathouse on: 06/08/2018 01:16 PM   Modules accepted: Orders

## 2018-06-12 ENCOUNTER — Other Ambulatory Visit: Payer: Self-pay

## 2018-06-12 ENCOUNTER — Encounter: Payer: Self-pay | Admitting: Family Medicine

## 2018-06-12 ENCOUNTER — Other Ambulatory Visit: Payer: Self-pay | Admitting: Family Medicine

## 2018-06-12 MED ORDER — AZITHROMYCIN 250 MG PO TABS: ORAL_TABLET | ORAL | 0 refills | Status: DC

## 2018-06-12 NOTE — Addendum Note (Signed)
Addended by: Cathrine Muster C on: 06/12/2018 10:30 AM   Modules accepted: Orders

## 2018-06-13 LAB — COMPLETE METABOLIC PANEL WITH GFR
AG RATIO: 1.6 (calc) (ref 1.0–2.5)
ALT: 23 U/L (ref 9–46)
AST: 28 U/L (ref 10–35)
Albumin: 4.2 g/dL (ref 3.6–5.1)
Alkaline phosphatase (APISO): 37 U/L (ref 35–144)
BUN: 12 mg/dL (ref 7–25)
CALCIUM: 9.5 mg/dL (ref 8.6–10.3)
CO2: 27 mmol/L (ref 20–32)
Chloride: 102 mmol/L (ref 98–110)
Creat: 1.07 mg/dL (ref 0.70–1.33)
GFR, EST AFRICAN AMERICAN: 88 mL/min/{1.73_m2} (ref >=60)
GFR, EST NON AFRICAN AMERICAN: 76 mL/min/{1.73_m2} (ref >=60)
GLOBULIN: 2.6 g/dL (ref 1.9–3.7)
Glucose, Bld: 109 mg/dL — ABNORMAL HIGH (ref 65–99)
POTASSIUM: 4.1 mmol/L (ref 3.5–5.3)
SODIUM: 138 mmol/L (ref 135–146)
TOTAL PROTEIN: 6.8 g/dL (ref 6.1–8.1)
Total Bilirubin: 0.4 mg/dL (ref 0.2–1.2)

## 2018-06-13 LAB — CBC WITH DIFFERENTIAL/PLATELET
Absolute Monocytes: 625 cells/uL (ref 200–950)
Basophils Absolute: 59 cells/uL (ref 0–200)
Basophils Relative: 1 %
EOS ABS: 71 {cells}/uL (ref 15–500)
Eosinophils Relative: 1.2 %
HCT: 39.9 % (ref 38.5–50.0)
HEMOGLOBIN: 13.5 g/dL (ref 13.2–17.1)
Lymphs Abs: 1752 cells/uL (ref 850–3900)
MCH: 27.1 pg (ref 27.0–33.0)
MCHC: 33.8 g/dL (ref 32.0–36.0)
MCV: 80.1 fL (ref 80.0–100.0)
MONOS PCT: 10.6 %
MPV: 10.3 fL (ref 7.5–12.5)
NEUTROS ABS: 3393 {cells}/uL (ref 1500–7800)
NEUTROS PCT: 57.5 %
Platelets: 269 10*3/uL (ref 140–400)
RBC: 4.98 10*6/uL (ref 4.20–5.80)
RDW: 13 % (ref 11.0–15.0)
Total Lymphocyte: 29.7 %
WBC: 5.9 10*3/uL (ref 3.8–10.8)

## 2018-07-06 ENCOUNTER — Encounter: Payer: Self-pay | Admitting: *Deleted

## 2018-07-08 ENCOUNTER — Encounter: Payer: Self-pay | Admitting: *Deleted

## 2018-09-01 ENCOUNTER — Encounter: Payer: Self-pay | Admitting: Family Medicine

## 2018-09-01 HISTORY — PX: REPLACEMENT TOTAL KNEE BILATERAL: SUR1225

## 2018-09-04 DIAGNOSIS — Z96659 Presence of unspecified artificial knee joint: Secondary | ICD-10-CM | POA: Insufficient documentation

## 2018-09-05 ENCOUNTER — Other Ambulatory Visit: Payer: Self-pay

## 2018-09-05 ENCOUNTER — Ambulatory Visit (INDEPENDENT_AMBULATORY_CARE_PROVIDER_SITE_OTHER): Payer: BC Managed Care – PPO | Admitting: Family Medicine

## 2018-09-05 ENCOUNTER — Encounter: Payer: Self-pay | Admitting: Family Medicine

## 2018-09-05 DIAGNOSIS — Z96653 Presence of artificial knee joint, bilateral: Secondary | ICD-10-CM | POA: Diagnosis not present

## 2018-09-05 DIAGNOSIS — G4733 Obstructive sleep apnea (adult) (pediatric): Secondary | ICD-10-CM

## 2018-09-05 DIAGNOSIS — I10 Essential (primary) hypertension: Secondary | ICD-10-CM

## 2018-09-05 DIAGNOSIS — R350 Frequency of micturition: Secondary | ICD-10-CM | POA: Diagnosis not present

## 2018-09-05 DIAGNOSIS — R809 Proteinuria, unspecified: Secondary | ICD-10-CM

## 2018-09-05 DIAGNOSIS — J449 Chronic obstructive pulmonary disease, unspecified: Secondary | ICD-10-CM

## 2018-09-05 DIAGNOSIS — E1129 Type 2 diabetes mellitus with other diabetic kidney complication: Secondary | ICD-10-CM

## 2018-09-05 MED ORDER — TAMSULOSIN HCL 0.4 MG PO CAPS: 0.4000 mg | ORAL_CAPSULE | Freq: Every day | ORAL | 0 refills | Status: DC

## 2018-09-05 NOTE — Progress Notes (Signed)
Name: Isaac Cross   MRN: [id number]    DOB: November 17, 1960   Date:09/05/2018       Progress Note  Subjective  Chief Complaint  Chief Complaint  Patient presents with  . Follow-up  . Urinary Tract Infection    small appts night frequecy  . Diabetes  . Hypertension  . Hyperlipidemia    I connected with  [name]  on 09/05/18 at  8:20 AM EDT by a video enabled telemedicine application and verified that I am speaking with the correct person using two identifiers.  I discussed the limitations of evaluation and management by telemedicine and the availability of in person appointments. The patient expressed understanding and agreed to proceed. Staff also discussed with the patient that there may be a patient responsible charge related to this service. Patient Location: at home Provider Location: [facility] Additional Individuals present: wife  HPI  S/P bilateral total knee Replacements:  It was done by  Dr. Harlow Mares on May 15th, 2020 in [place]. He is recovering well, going to start PT. He has noticed constipation, he has been moving around the house with his walker.   Urinary frequency: without dysuria or hematuria, he states started after he went home from surgery. 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 Trying to follow a diabetic diet   HTN: taking medication, bp is at goal now, no palpitation, no chest pain, decrease in exercise tolerance and he denies any dizziness. BP during surgery was normal last week.    Patient Active Problem List   Diagnosis Date Noted  . History of total knee arthroplasty 09/04/2018  . Coronary atherosclerosis due to calcified coronary lesion 12/02/2017  . Atherosclerosis of abdominal aorta ([place]) 06/17/2015  . Fatty liver disease, nonalcoholic 78/29/5621  . Renal cyst, right 06/17/2015  . BPH (benign prostatic hyperplasia) 05/25/2015  . Microscopic hematuria 05/25/2015  . Second degree hemorrhoids   . Diverticulosis of large intestine without diverticulitis   . Asthma, mild intermittent, well-controlled 10/16/2014  . Allergic rhinitis 10/13/2014  . ED (erectile dysfunction) of organic origin 07/22/2014  . Gastro-esophageal reflux disease without esophagitis 07/22/2014  . Benign hypertension 07/22/2014  . 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[place]) 07/22/2014  . Adult BMI 30+ 07/22/2014  . Chronic obstructive asthma ([place]) 07/22/2014  . Depression with anxiety 07/22/2014  . Dyslipidemia 07/22/2014  . Genital herpes 07/22/2014  . 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 Osteoarthritis of both knees 07/22/2014  . Obstructive apnea 05/14/2013  . 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 Blood in the urine 09/20/2006    Past Surgical History:  Procedure Laterality Date  . BLADDER REPAIR    . COLONOSCOPY WITH PROPOFOL N/A 04/07/2015   Procedure: COLONOSCOPY WITH PROPOFOL;  Surgeon: Lucilla Lame,  MD;  Location: [facility];  Service: Endoscopy;  Laterality: N/A;  Diabetic - oral meds CPAP  . HAND SURGERY     4TH AND 5TH FINGER  . KNEE ARTHROSCOPY Right   . 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 VEIN SURGERY Right     Family History  Problem  Relation Age of Onset  . Hypertension Mother   . Diabetes Mother   . Diabetes Father   . Hypertension Father   . Cancer Father   . Seizures Daughter   . Lupus Daughter   . Cancer Maternal Grandfather        Prostate  . Diabetes Paternal Grandfather   . Prostate cancer Maternal Uncle   . Bladder Cancer Neg Hx   . Kidney cancer Neg Hx     Social History   Socioeconomic History  . Marital status: Married    Spouse name: Not on file  . Number of children: 2  . Years of education: Not on file  . Highest education level: Bachelor's degree (e.g., BA, AB, BS)  Occupational History  . Occupation: case Freight forwarder   Social Needs  . Financial resource strain: Not hard at all  . Food insecurity:    Worry: Never true    Inability: Never true  . Transportation needs:    Medical: No    Non-medical: No  Tobacco Use  . Smoking status: Former Smoker    Packs/day: 1.00    Years: 10.00    Pack years: 10.00    Types: Cigarettes    Start date: 04/20/1995    Last attempt to quit: 04/19/2005    Years since quitting: 13.3  . Smokeless tobacco: Never Used  Substance and Sexual Activity  . Alcohol use: Yes    Alcohol/week: 10.0 standard drinks    Types: 10 Cans of beer per week    Comment: occasional  . Drug use: No  . Sexual activity: Yes    Partners: Female  Lifestyle  . Physical activity:    Days per week: 0 days    Minutes per session: 0 min  . Stress: Not at all  Relationships  . Social connections:    Talks on phone: More than three times a week    Gets together: Once a week    Attends religious service: Never    Active member of club or organization: No    Attends meetings of clubs or organizations: Never    Relationship status: Married  . Intimate partner violence:    Fear of current or ex partner: No    Emotionally abused: No    Physically abused: No    Forced sexual activity: No  Other Topics Concern  . Not on file  Social History Narrative  . Not on file      Current Outpatient Medications:  .  acetaminophen (TYLENOL) 500 MG tablet, Take 1 tablet (500 mg total) by mouth every 6 (six) hours as needed., Disp: 90 tablet, Rfl: 0 .  albuterol (PROVENTIL HFA;VENTOLIN HFA) 108 (90 Base) MCG/ACT inhaler, INHALE 2 PUFFS INTO THE LUNGS 4 (FOUR) TIMES DAILY., Disp: 6.7 Inhaler, Rfl: 0 .  aspirin 81 MG tablet, Take 81 mg by mouth daily., Disp: , Rfl:  .  atenolol (TENORMIN) 25 MG tablet, Take 1 tablet (25 mg total) by mouth every evening., Disp: 90 tablet, Rfl: 1 .  cetirizine (ZYRTEC) 10 MG tablet, Take 10 mg by mouth daily., Disp: , Rfl:  .  citalopram (CELEXA) 20 MG  tablet, Take 1 tablet (20 mg total) by mouth daily., Disp: 90 tablet, Rfl: 1 .  diclofenac sodium (VOLTAREN) 1 % GEL, APPLY 4 GRAMS TOPICALLY FOUR TIMES A DAY, Disp: 100 g, Rfl: 2 .  fluticasone furoate-vilanterol (BREO ELLIPTA) 100-25 MCG/INH AEPB, Inhale 1 puff into the lungs daily., Disp: 180 each, Rfl: 1 .  lidocaine (XYLOCAINE) 2 % solution, Use as directed 15 mLs in the mouth or throat as needed for mouth pain., Disp: 100 mL, Rfl: 0 .  lisinopril (PRINIVIL,ZESTRIL) 40 MG tablet, Take 1 tablet (40 mg total) by mouth daily., Disp: 90 tablet, Rfl: 1 .  metFORMIN (GLUCOPHAGE) 1000 MG tablet, Take 1 tablet (1,000 mg total) by mouth 2 (two) times daily with a meal., Disp: 180 tablet, Rfl: 1 .  Multiple Vitamins-Minerals (MENS MULTIVITAMIN PLUS) TABS, Take by mouth., Disp: , Rfl:  .  Omega-3 Fatty Acids (FISH OIL) 1000 MG CAPS, Take by mouth daily., Disp: , Rfl:  .  Oxycodone HCl 10 MG TABS, , Disp: , Rfl:  .  rosuvastatin (CRESTOR) 20 MG tablet, TAKE 1 TABLET BY MOUTH EVERY DAY, Disp: 90 tablet, Rfl: 1 .  triamcinolone cream (KENALOG) 0.1 %, triamcinolone acetonide 0.1 % topical cream, Disp: , Rfl:  .  XARELTO 10 MG TABS tablet, , Disp: , Rfl:  .  glucose blood test strip, 1 strip by Other route 2 (two) times daily., Disp: , Rfl:  .  tamsulosin (FLOMAX) 0.4 MG CAPS capsule, Take 1 capsule (0.4 mg  total) by mouth daily., Disp: 30 capsule, Rfl: 0  Allergies  Allergen Reactions  . Lipitor [Atorvastatin] Hives and Itching  . Other   . Viagra  [Sildenafil Citrate]     vision loss    I personally reviewed active problem list, medication list, allergies, family history, social history with the patient/caregiver today.   ROS  Ten systems reviewed and is negative except as mentioned in HPI, he has also noticed constipation advised colace daily with pain pills   Objective  Virtual encounter, vitals not obtained.  There is no height or weight on file to calculate BMI.  Physical Exam  Sitting on his sofa, in no distress, legs propped up, wife removed gauze and right knee has some bruising and erythema , left some erythema, they denied increase in warmth and pain is going down, PT will be there today  PHQ2/9: Depression screen [facility] 2/9 09/05/2018 03/06/2018 12/02/2017 07/13/2017 12/08/2016  Decreased Interest 0 0 0 0 0  Down, Depressed, Hopeless 0 0 0 0 0  PHQ - 2 Score 0 0 0 0 0  Altered sleeping 0 0 0 - -  Tired, decreased energy 0 0 1 - -  Change in appetite 0 0 0 - -  Feeling bad or failure about yourself  0 0 0 - -  Trouble concentrating 0 0 0 - -  Moving slowly or fidgety/restless 0 0 0 - -  Suicidal thoughts 0 0 0 - -  PHQ-9 Score 0 0 1 - -  Difficult doing work/chores Not difficult at all Not difficult at all Not difficult at all - -   PHQ-2/9 Result is negative.    Fall Risk: Fall Risk  09/05/2018 06/07/2018 03/15/2018 03/06/2018 12/02/2017  Falls in the past year? 0 0 0 0 No  Number falls in past yr: 0 0 - 0 -  Injury with Fall? 0 0 - 0 -     Assessment & Plan  1. History of knee replacement, total, bilateral  Doing well, pain down to 7   2. Urinary frequency  Explained likely from [place], we will try Flomax, but if no resolution of symptoms he was advised to drop off urine specimen for culture - tamsulosin (FLOMAX) 0.4 MG CAPS capsule; Take 1 capsule (0.4 mg  total) by mouth daily.  Dispense: 30 capsule; Refill: 0  3. COPD with asthma ([place])  Continue medications   4. Obstructive apnea  Compliant   5. Type 2 diabetes mellitus with microalbuminuria, without long-term current use of insulin ([place])  Recheck next visit, at home now, following diabetic diet  6. Benign hypertension  Continue medication  I discussed the assessment and treatment plan with the patient. The patient was provided an opportunity to ask questions and all were answered. The patient agreed with the plan and demonstrated an understanding of the instructions.  The patient was advised to call back or seek an in-person evaluation if the symptoms worsen or if the condition fails to improve as anticipated.  I provided 25 minutes of non-face-to-face time during this encounter.

## 2018-09-12 ENCOUNTER — Other Ambulatory Visit: Payer: Self-pay

## 2018-09-12 ENCOUNTER — Emergency Department: Payer: BC Managed Care – PPO

## 2018-09-12 ENCOUNTER — Encounter: Payer: Self-pay | Admitting: Emergency Medicine

## 2018-09-12 ENCOUNTER — Inpatient Hospital Stay
Admission: EM | Admit: 2018-09-12 | Discharge: 2018-10-12 | DRG: 463 | Disposition: A | Discharge status: Home Health | Payer: BC Managed Care – PPO | Attending: Internal Medicine | Admitting: Internal Medicine

## 2018-09-12 DIAGNOSIS — Z7984 Long term (current) use of oral hypoglycemic drugs: Secondary | ICD-10-CM

## 2018-09-12 DIAGNOSIS — F329 Major depressive disorder, single episode, unspecified: Secondary | ICD-10-CM | POA: Diagnosis present

## 2018-09-12 DIAGNOSIS — Y831 Surgical operation with implant of artificial internal device as the cause of abnormal reaction of the patient, or of later complication, without mention of misadventure at the time of the procedure: Secondary | ICD-10-CM | POA: Diagnosis present

## 2018-09-12 DIAGNOSIS — E1165 Type 2 diabetes mellitus with hyperglycemia: Secondary | ICD-10-CM | POA: Diagnosis present

## 2018-09-12 DIAGNOSIS — N1330 Unspecified hydronephrosis: Secondary | ICD-10-CM

## 2018-09-12 DIAGNOSIS — I10 Essential (primary) hypertension: Secondary | ICD-10-CM | POA: Diagnosis not present

## 2018-09-12 DIAGNOSIS — J9600 Acute respiratory failure, unspecified whether with hypoxia or hypercapnia: Secondary | ICD-10-CM | POA: Diagnosis not present

## 2018-09-12 DIAGNOSIS — D62 Acute posthemorrhagic anemia: Secondary | ICD-10-CM | POA: Diagnosis present

## 2018-09-12 DIAGNOSIS — Z96653 Presence of artificial knee joint, bilateral: Secondary | ICD-10-CM | POA: Diagnosis not present

## 2018-09-12 DIAGNOSIS — J4489 Other specified chronic obstructive pulmonary disease: Secondary | ICD-10-CM | POA: Diagnosis present

## 2018-09-12 DIAGNOSIS — R509 Fever, unspecified: Secondary | ICD-10-CM | POA: Diagnosis not present

## 2018-09-12 DIAGNOSIS — G253 Myoclonus: Secondary | ICD-10-CM | POA: Diagnosis not present

## 2018-09-12 DIAGNOSIS — G3184 Mild cognitive impairment, so stated: Secondary | ICD-10-CM | POA: Diagnosis present

## 2018-09-12 DIAGNOSIS — K219 Gastro-esophageal reflux disease without esophagitis: Secondary | ICD-10-CM | POA: Diagnosis present

## 2018-09-12 DIAGNOSIS — D3500 Benign neoplasm of unspecified adrenal gland: Secondary | ICD-10-CM | POA: Diagnosis present

## 2018-09-12 DIAGNOSIS — E272 Addisonian crisis: Secondary | ICD-10-CM | POA: Diagnosis present

## 2018-09-12 DIAGNOSIS — D7582 Heparin induced thrombocytopenia (HIT): Secondary | ICD-10-CM | POA: Diagnosis present

## 2018-09-12 DIAGNOSIS — E876 Hypokalemia: Secondary | ICD-10-CM | POA: Diagnosis present

## 2018-09-12 DIAGNOSIS — N179 Acute kidney failure, unspecified: Secondary | ICD-10-CM

## 2018-09-12 DIAGNOSIS — I82432 Acute embolism and thrombosis of left popliteal vein: Secondary | ICD-10-CM | POA: Diagnosis present

## 2018-09-12 DIAGNOSIS — I959 Hypotension, unspecified: Secondary | ICD-10-CM | POA: Diagnosis not present

## 2018-09-12 DIAGNOSIS — M009 Pyogenic arthritis, unspecified: Secondary | ICD-10-CM | POA: Diagnosis not present

## 2018-09-12 DIAGNOSIS — F419 Anxiety disorder, unspecified: Secondary | ICD-10-CM | POA: Diagnosis present

## 2018-09-12 DIAGNOSIS — A419 Sepsis, unspecified organism: Secondary | ICD-10-CM | POA: Diagnosis present

## 2018-09-12 DIAGNOSIS — R6521 Severe sepsis with septic shock: Secondary | ICD-10-CM | POA: Diagnosis present

## 2018-09-12 DIAGNOSIS — Z87440 Personal history of urinary (tract) infections: Secondary | ICD-10-CM

## 2018-09-12 DIAGNOSIS — N170 Acute kidney failure with tubular necrosis: Secondary | ICD-10-CM | POA: Diagnosis present

## 2018-09-12 DIAGNOSIS — E871 Hypo-osmolality and hyponatremia: Secondary | ICD-10-CM | POA: Diagnosis present

## 2018-09-12 DIAGNOSIS — Z1159 Encounter for screening for other viral diseases: Secondary | ICD-10-CM | POA: Diagnosis not present

## 2018-09-12 DIAGNOSIS — Z960 Presence of urogenital implants: Secondary | ICD-10-CM | POA: Diagnosis not present

## 2018-09-12 DIAGNOSIS — K7460 Unspecified cirrhosis of liver: Secondary | ICD-10-CM | POA: Diagnosis not present

## 2018-09-12 DIAGNOSIS — Z452 Encounter for adjustment and management of vascular access device: Secondary | ICD-10-CM

## 2018-09-12 DIAGNOSIS — Z7982 Long term (current) use of aspirin: Secondary | ICD-10-CM

## 2018-09-12 DIAGNOSIS — E1169 Type 2 diabetes mellitus with other specified complication: Secondary | ICD-10-CM | POA: Diagnosis present

## 2018-09-12 DIAGNOSIS — G9341 Metabolic encephalopathy: Secondary | ICD-10-CM | POA: Diagnosis not present

## 2018-09-12 DIAGNOSIS — E1129 Type 2 diabetes mellitus with other diabetic kidney complication: Secondary | ICD-10-CM | POA: Diagnosis not present

## 2018-09-12 DIAGNOSIS — R350 Frequency of micturition: Secondary | ICD-10-CM | POA: Diagnosis not present

## 2018-09-12 DIAGNOSIS — Z79899 Other long term (current) drug therapy: Secondary | ICD-10-CM

## 2018-09-12 DIAGNOSIS — Z888 Allergy status to other drugs, medicaments and biological substances status: Secondary | ICD-10-CM

## 2018-09-12 DIAGNOSIS — E119 Type 2 diabetes mellitus without complications: Secondary | ICD-10-CM | POA: Diagnosis not present

## 2018-09-12 DIAGNOSIS — Z87891 Personal history of nicotine dependence: Secondary | ICD-10-CM

## 2018-09-12 DIAGNOSIS — J4520 Mild intermittent asthma, uncomplicated: Secondary | ICD-10-CM | POA: Diagnosis present

## 2018-09-12 DIAGNOSIS — R0602 Shortness of breath: Secondary | ICD-10-CM

## 2018-09-12 DIAGNOSIS — Z8249 Family history of ischemic heart disease and other diseases of the circulatory system: Secondary | ICD-10-CM

## 2018-09-12 DIAGNOSIS — F418 Other specified anxiety disorders: Secondary | ICD-10-CM | POA: Diagnosis not present

## 2018-09-12 DIAGNOSIS — E860 Dehydration: Secondary | ICD-10-CM | POA: Diagnosis present

## 2018-09-12 DIAGNOSIS — R809 Proteinuria, unspecified: Secondary | ICD-10-CM | POA: Diagnosis present

## 2018-09-12 DIAGNOSIS — J449 Chronic obstructive pulmonary disease, unspecified: Secondary | ICD-10-CM | POA: Diagnosis present

## 2018-09-12 DIAGNOSIS — T8453XA Infection and inflammatory reaction due to internal right knee prosthesis, initial encounter: Secondary | ICD-10-CM | POA: Diagnosis present

## 2018-09-12 DIAGNOSIS — R42 Dizziness and giddiness: Secondary | ICD-10-CM | POA: Diagnosis present

## 2018-09-12 DIAGNOSIS — E1369 Other specified diabetes mellitus with other specified complication: Secondary | ICD-10-CM

## 2018-09-12 DIAGNOSIS — R410 Disorientation, unspecified: Secondary | ICD-10-CM | POA: Diagnosis not present

## 2018-09-12 DIAGNOSIS — D638 Anemia in other chronic diseases classified elsewhere: Secondary | ICD-10-CM | POA: Diagnosis present

## 2018-09-12 DIAGNOSIS — G4733 Obstructive sleep apnea (adult) (pediatric): Secondary | ICD-10-CM | POA: Diagnosis present

## 2018-09-12 DIAGNOSIS — A391 Waterhouse-Friderichsen syndrome: Secondary | ICD-10-CM | POA: Diagnosis present

## 2018-09-12 DIAGNOSIS — T8454XA Infection and inflammatory reaction due to internal left knee prosthesis, initial encounter: Secondary | ICD-10-CM | POA: Diagnosis present

## 2018-09-12 DIAGNOSIS — D696 Thrombocytopenia, unspecified: Secondary | ICD-10-CM | POA: Diagnosis not present

## 2018-09-12 DIAGNOSIS — E271 Primary adrenocortical insufficiency: Secondary | ICD-10-CM | POA: Diagnosis not present

## 2018-09-12 DIAGNOSIS — E279 Disorder of adrenal gland, unspecified: Secondary | ICD-10-CM | POA: Diagnosis not present

## 2018-09-12 DIAGNOSIS — G9340 Encephalopathy, unspecified: Secondary | ICD-10-CM | POA: Diagnosis not present

## 2018-09-12 DIAGNOSIS — R531 Weakness: Secondary | ICD-10-CM | POA: Diagnosis not present

## 2018-09-12 DIAGNOSIS — Z95828 Presence of other vascular implants and grafts: Secondary | ICD-10-CM | POA: Diagnosis not present

## 2018-09-12 DIAGNOSIS — T45515A Adverse effect of anticoagulants, initial encounter: Secondary | ICD-10-CM | POA: Diagnosis not present

## 2018-09-12 DIAGNOSIS — D649 Anemia, unspecified: Secondary | ICD-10-CM | POA: Diagnosis not present

## 2018-09-12 DIAGNOSIS — Z833 Family history of diabetes mellitus: Secondary | ICD-10-CM

## 2018-09-12 DIAGNOSIS — R609 Edema, unspecified: Secondary | ICD-10-CM

## 2018-09-12 DIAGNOSIS — E8770 Fluid overload, unspecified: Secondary | ICD-10-CM | POA: Diagnosis present

## 2018-09-12 DIAGNOSIS — Z992 Dependence on renal dialysis: Secondary | ICD-10-CM | POA: Diagnosis not present

## 2018-09-12 DIAGNOSIS — F10231 Alcohol dependence with withdrawal delirium: Secondary | ICD-10-CM | POA: Diagnosis not present

## 2018-09-12 DIAGNOSIS — Z7901 Long term (current) use of anticoagulants: Secondary | ICD-10-CM

## 2018-09-12 DIAGNOSIS — E785 Hyperlipidemia, unspecified: Secondary | ICD-10-CM | POA: Diagnosis present

## 2018-09-12 DIAGNOSIS — W06XXXA Fall from bed, initial encounter: Secondary | ICD-10-CM | POA: Diagnosis present

## 2018-09-12 LAB — CBC WITH DIFFERENTIAL/PLATELET
Abs Immature Granulocytes: 0.48 10*3/uL — ABNORMAL HIGH (ref 0.00–0.07)
Basophils Absolute: 0.1 10*3/uL (ref 0.0–0.1)
Basophils Relative: 1 %
Eosinophils Absolute: 0.3 10*3/uL (ref 0.0–0.5)
Eosinophils Relative: 2 %
HCT: 35.7 % — ABNORMAL LOW (ref 39.0–52.0)
Hemoglobin: 11.5 g/dL — ABNORMAL LOW (ref 13.0–17.0)
Immature Granulocytes: 3 %
Lymphocytes Relative: 21 %
Lymphs Abs: 3.4 10*3/uL (ref 0.7–4.0)
MCH: 26.6 pg (ref 26.0–34.0)
MCHC: 32.2 g/dL (ref 30.0–36.0)
MCV: 82.4 fL (ref 80.0–100.0)
Monocytes Absolute: 1.5 10*3/uL — ABNORMAL HIGH (ref 0.1–1.0)
Monocytes Relative: 9 %
Neutro Abs: 10.3 10*3/uL — ABNORMAL HIGH (ref 1.7–7.7)
Neutrophils Relative %: 64 %
Platelets: 137 10*3/uL — ABNORMAL LOW (ref 150–400)
RBC: 4.33 MIL/uL (ref 4.22–5.81)
RDW: 14.3 % (ref 11.5–15.5)
WBC: 16.1 10*3/uL — ABNORMAL HIGH (ref 4.0–10.5)
nRBC: 0.2 % (ref 0.0–0.2)

## 2018-09-12 LAB — COMPREHENSIVE METABOLIC PANEL
ALT: 42 U/L (ref 0–44)
AST: 70 U/L — ABNORMAL HIGH (ref 15–41)
Albumin: 3 g/dL — ABNORMAL LOW (ref 3.5–5.0)
Alkaline Phosphatase: 86 U/L (ref 38–126)
Anion gap: 19 — ABNORMAL HIGH (ref 5–15)
BUN: 28 mg/dL — ABNORMAL HIGH (ref 6–20)
CO2: 17 mmol/L — ABNORMAL LOW (ref 22–32)
Calcium: 9.7 mg/dL (ref 8.9–10.3)
Chloride: 90 mmol/L — ABNORMAL LOW (ref 98–111)
Creatinine, Ser: 3.71 mg/dL — ABNORMAL HIGH (ref 0.61–1.24)
GFR calc Af Amer: 19 mL/min — ABNORMAL LOW (ref >60)
GFR calc non Af Amer: 17 mL/min — ABNORMAL LOW (ref >60)
Glucose, Bld: 129 mg/dL — ABNORMAL HIGH (ref 70–99)
Potassium: 4.4 mmol/L (ref 3.5–5.1)
Sodium: 126 mmol/L — ABNORMAL LOW (ref 135–145)
Total Bilirubin: 0.7 mg/dL (ref 0.3–1.2)
Total Protein: 7.6 g/dL (ref 6.5–8.1)

## 2018-09-12 LAB — SARS CORONAVIRUS 2 BY RT PCR (HOSPITAL ORDER, PERFORMED IN ~~LOC~~ HOSPITAL LAB): SARS Coronavirus 2: NEGATIVE

## 2018-09-12 LAB — URINALYSIS, COMPLETE (UACMP) WITH MICROSCOPIC
Bacteria, UA: NONE SEEN
Bilirubin Urine: NEGATIVE
Glucose, UA: NEGATIVE mg/dL
Hgb urine dipstick: NEGATIVE
Ketones, ur: 5 mg/dL — AB
Leukocytes,Ua: NEGATIVE
Nitrite: NEGATIVE
Protein, ur: 100 mg/dL — AB
Specific Gravity, Urine: 1.026 (ref 1.005–1.030)
pH: 5 (ref 5.0–8.0)

## 2018-09-12 LAB — LACTIC ACID, PLASMA
Lactic Acid, Venous: 4.9 mmol/L (ref 0.5–1.9)
Lactic Acid, Venous: 2 mmol/L (ref 0.5–1.9)
Lactic Acid, Venous: 1.4 mmol/L (ref 0.5–1.9)
Lactic Acid, Venous: 1.2 mmol/L (ref 0.5–1.9)

## 2018-09-12 LAB — MRSA PCR SCREENING: MRSA by PCR: NEGATIVE

## 2018-09-12 LAB — GLUCOSE, CAPILLARY
Glucose-Capillary: 167 mg/dL — ABNORMAL HIGH (ref 70–99)
Glucose-Capillary: 218 mg/dL — ABNORMAL HIGH (ref 70–99)

## 2018-09-12 LAB — PROCALCITONIN: Procalcitonin: 6.97 ng/mL

## 2018-09-12 LAB — HEMOGLOBIN A1C
Hgb A1c MFr Bld: 6.9 % — ABNORMAL HIGH (ref 4.8–5.6)
Mean Plasma Glucose: 151.33 mg/dL

## 2018-09-12 LAB — SEDIMENTATION RATE: Sed Rate: 64 mm/h — ABNORMAL HIGH (ref 0–20)

## 2018-09-12 LAB — APTT: aPTT: 33 seconds (ref 24–36)

## 2018-09-12 LAB — PROTIME-INR
INR: 1.2 (ref 0.8–1.2)
Prothrombin Time: 15.3 seconds — ABNORMAL HIGH (ref 11.4–15.2)

## 2018-09-12 LAB — LIPASE, BLOOD: Lipase: 74 U/L — ABNORMAL HIGH (ref 11–51)

## 2018-09-12 IMAGING — DX PORTABLE CHEST - 1 VIEW
1 series · 1 of 1 positions shown · non-contrast
Comparison: [DATE]

CLINICAL DATA: 58-year-old male with a history of increasing
dizziness

EXAM:
PORTABLE CHEST 1 VIEW

[chest ap]
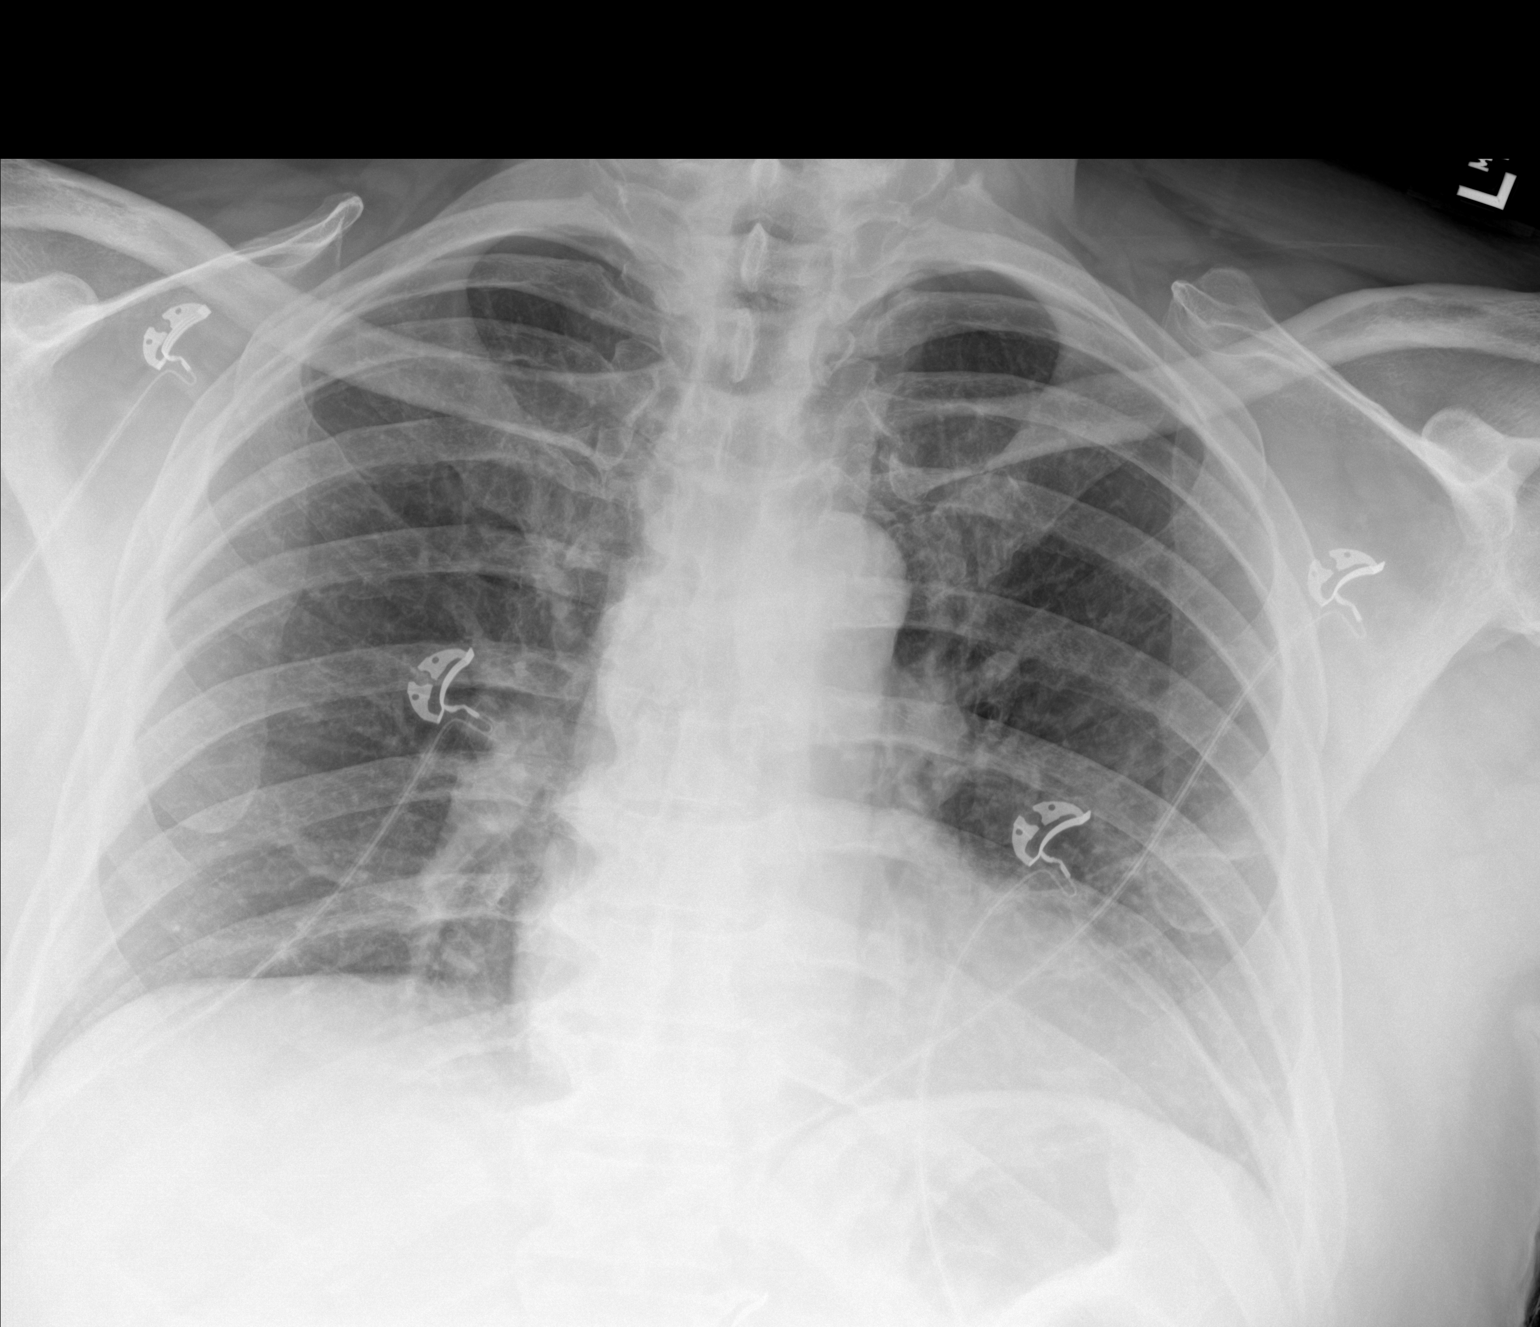

[1 of 1 positions shown; findings below may reference images not displayed]

FINDINGS: Cardiomediastinal silhouette unchanged in size and contour. No
pneumothorax or pleural effusion. Low lung volumes with coarsened
interstitial markings.

Linear opacity at the left lung base with no confluent airspace
disease.
IMPRESSION: Low lung volumes without evidence of acute cardiopulmonary disease.

## 2018-09-12 IMAGING — DX LEFT KNEE - COMPLETE 4+ VIEW
4 series · 4 of 4 positions shown · non-contrast
Comparison: None.

CLINICAL DATA: 58-year-old male with a history of knee replacement

EXAM:
LEFT KNEE - COMPLETE 4+ VIEW

[knee ap]
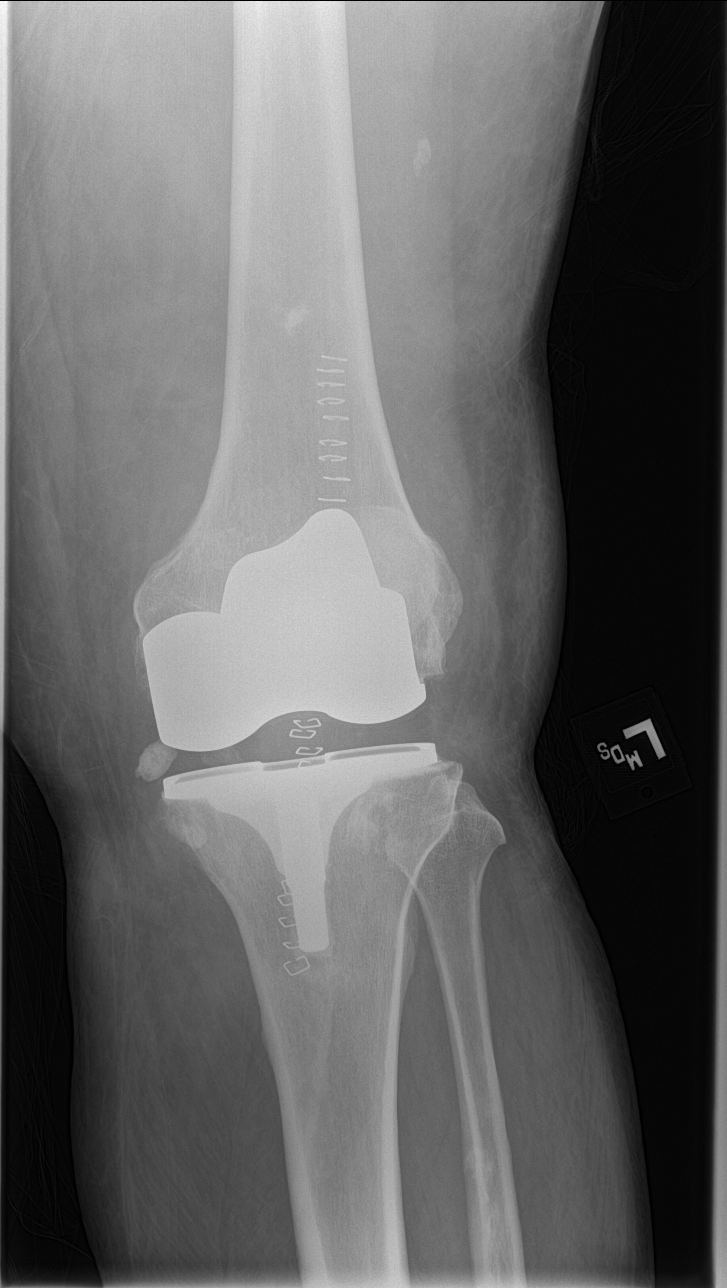

[knee lat]
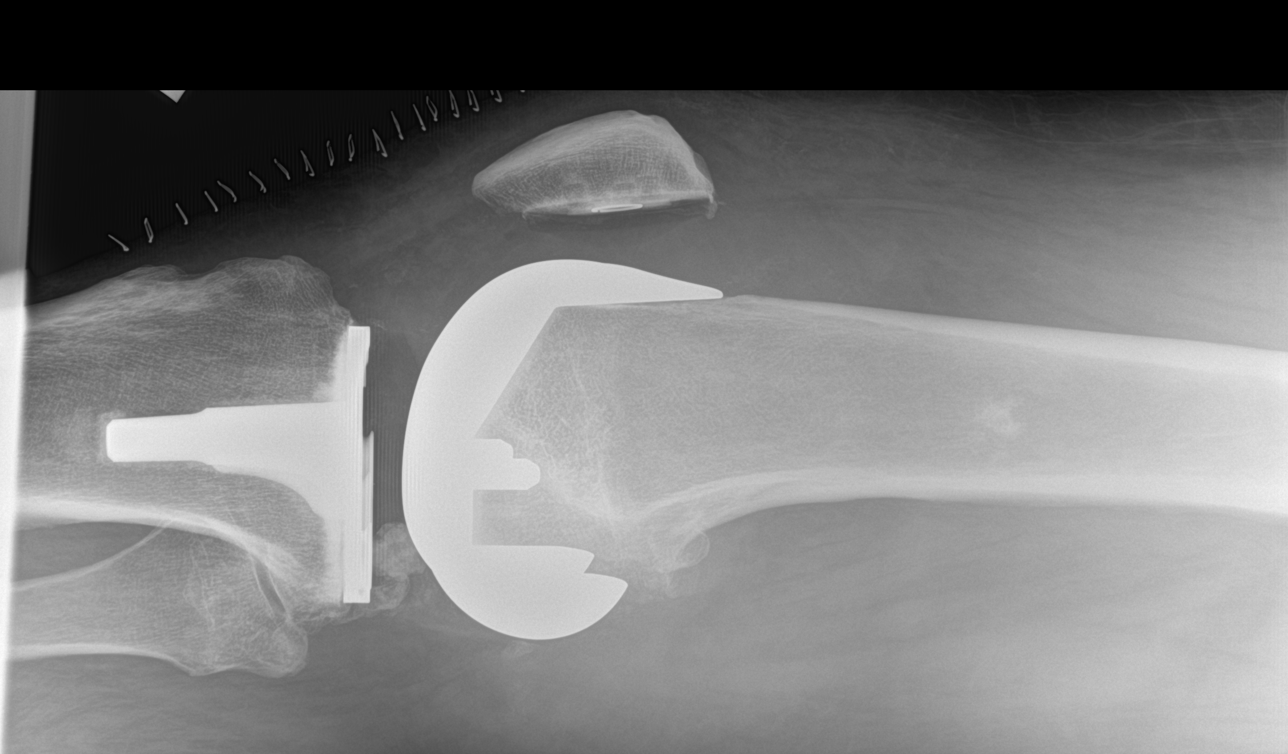

[knee obl (1 of 2)]
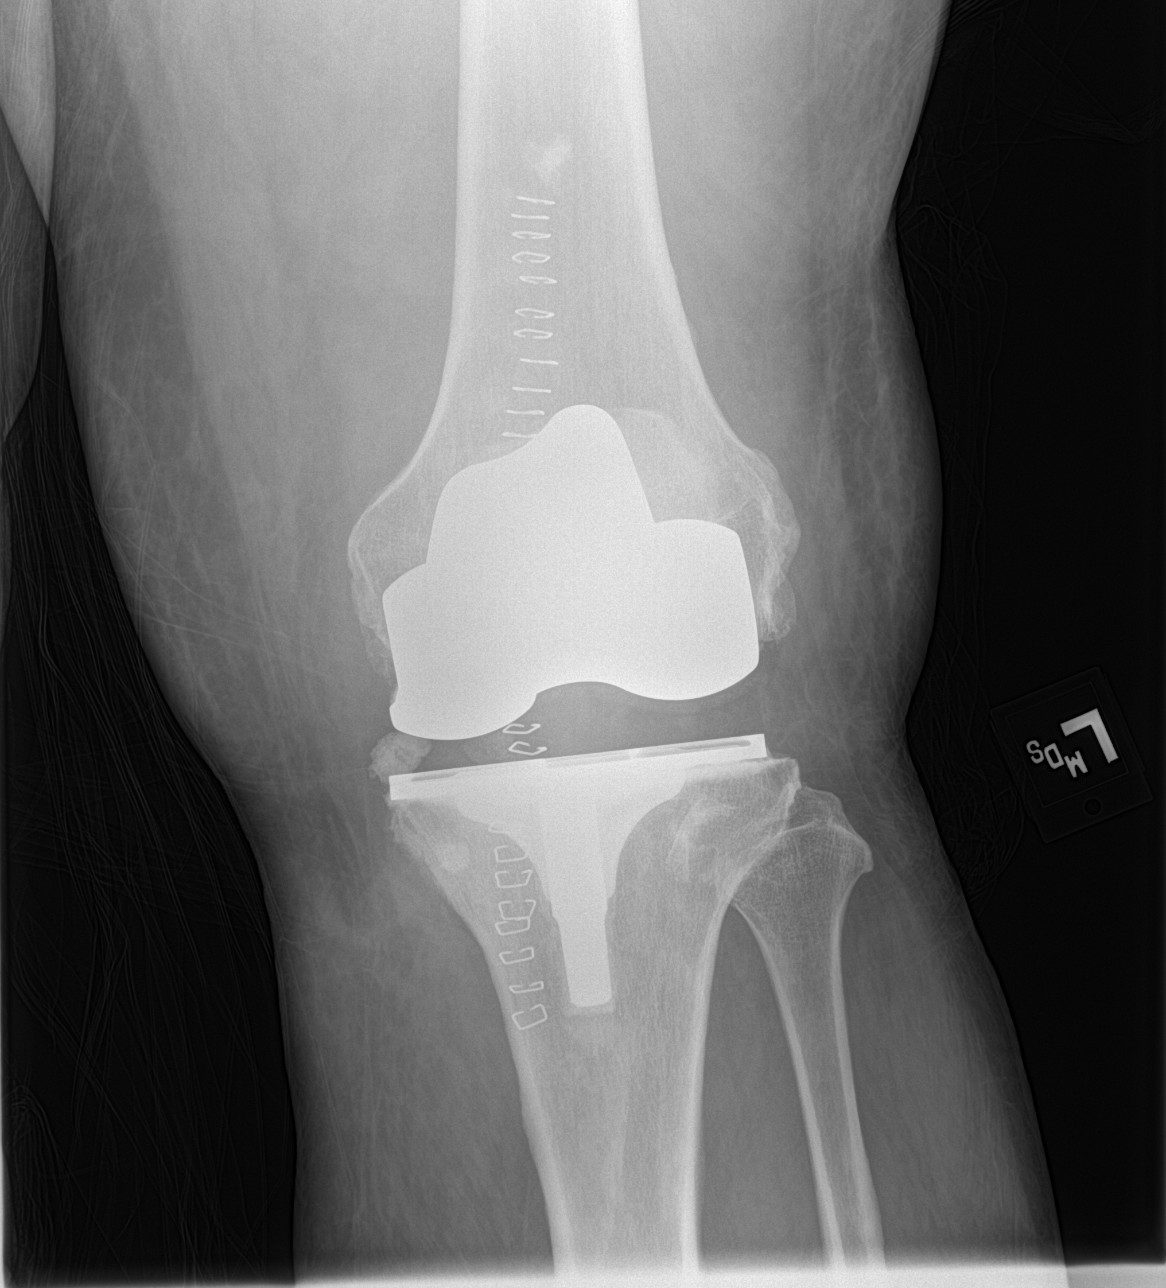

[knee obl (2 of 2)]
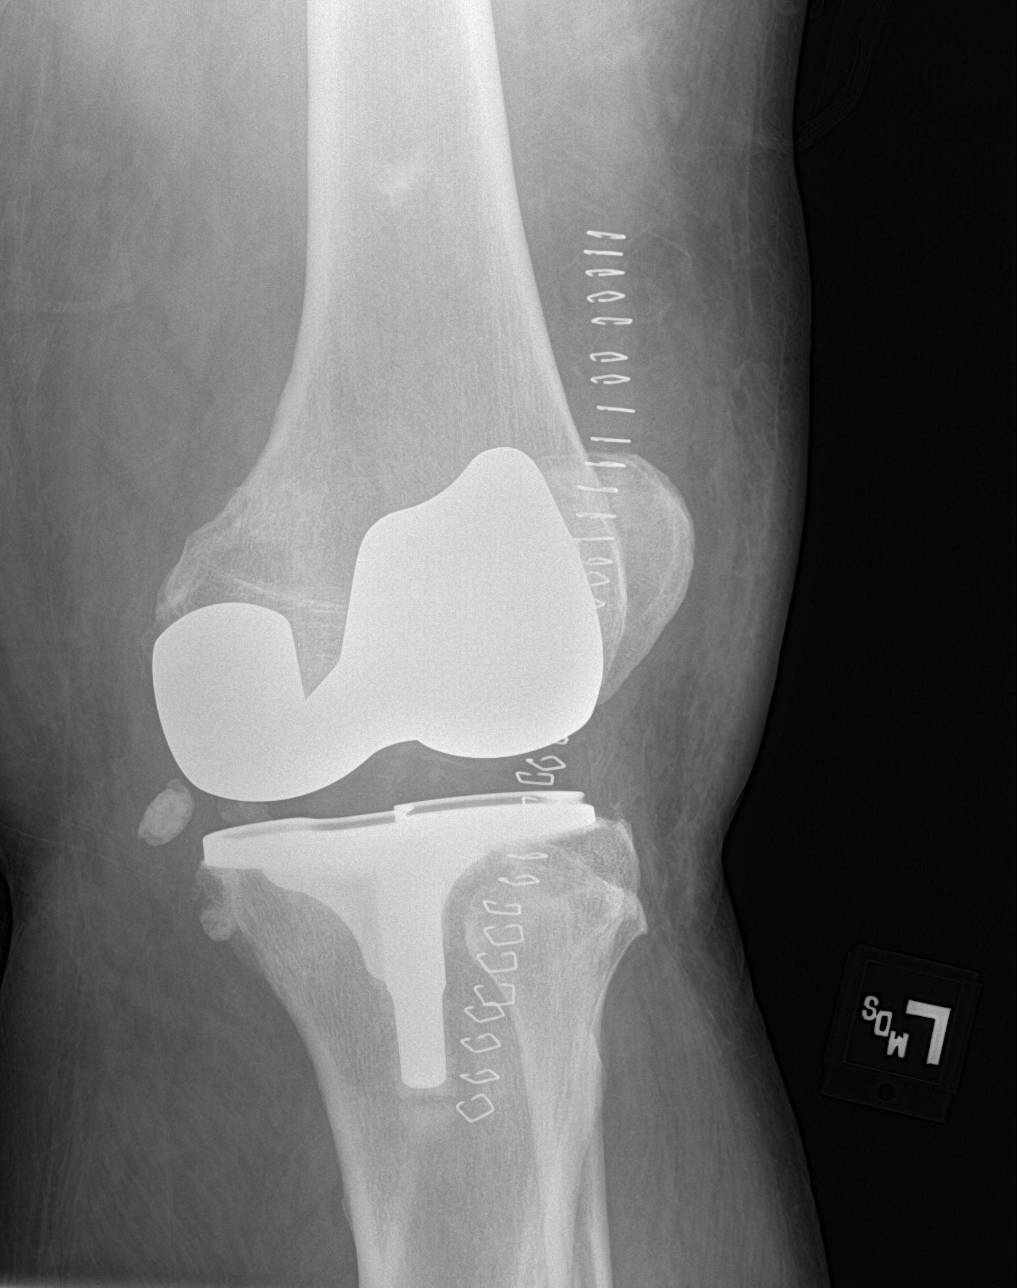

[4 of 4 positions shown; findings below may reference images not displayed]

FINDINGS: No acute displaced fracture. Surgical changes of arthroplasty left
knee. Joint effusion. Surgical staples project within the soft
tissues of the midline. Soft tissue swelling at the surgical site.
IMPRESSION: Early surgical changes of left knee arthroplasty, with soft tissue
swelling and joint effusion.

## 2018-09-12 MED ORDER — ONDANSETRON HCL 4 MG PO TABS: 4.0000 mg | ORAL_TABLET | Freq: Four times a day (QID) | ORAL | Status: DC | PRN

## 2018-09-12 MED ORDER — FLUTICASONE FUROATE-VILANTEROL 100-25 MCG/INH IN AEPB
1.0000 | INHALATION_SPRAY | Freq: Every day | RESPIRATORY_TRACT | Status: DC
  Administered 2018-09-13 – 2018-09-20 (×7): 1 via RESPIRATORY_TRACT
  Filled 2018-09-12: qty 28

## 2018-09-12 MED ORDER — INSULIN ASPART 100 UNIT/ML ~~LOC~~ SOLN
0.0000 [IU] | Freq: Every day | SUBCUTANEOUS | Status: DC
  Administered 2018-09-13: 2 [IU] via SUBCUTANEOUS
  Administered 2018-09-14: 3 [IU] via SUBCUTANEOUS
  Administered 2018-09-16: 2 [IU] via SUBCUTANEOUS
  Administered 2018-09-21: 3 [IU] via SUBCUTANEOUS
  Filled 2018-09-12 (×5): qty 1

## 2018-09-12 MED ORDER — SODIUM CHLORIDE 0.9 % IV SOLN
2.0000 g | Freq: Two times a day (BID) | INTRAVENOUS | Status: DC
  Administered 2018-09-12 – 2018-09-19 (×13): 2 g via INTRAVENOUS
  Filled 2018-09-12 (×16): qty 2

## 2018-09-12 MED ORDER — ACETAMINOPHEN 325 MG PO TABS
650.0000 mg | ORAL_TABLET | Freq: Four times a day (QID) | ORAL | Status: DC | PRN
  Administered 2018-09-13 – 2018-10-05 (×14): 650 mg via ORAL
  Filled 2018-09-12 (×15): qty 2

## 2018-09-12 MED ORDER — PHENYLEPHRINE HCL-NACL 10-0.9 MG/250ML-% IV SOLN
0.0000 ug/min | INTRAVENOUS | Status: DC
  Filled 2018-09-12: qty 250

## 2018-09-12 MED ORDER — SODIUM CHLORIDE 0.9 % IV BOLUS (SEPSIS)
1500.0000 mL | Freq: Once | INTRAVENOUS | Status: AC
  Administered 2018-09-12: 12:00:00 1500 mL via INTRAVENOUS

## 2018-09-12 MED ORDER — ASPIRIN 81 MG PO TABS: 81.0000 mg | ORAL_TABLET | Freq: Every day | ORAL | Status: DC

## 2018-09-12 MED ORDER — INSULIN ASPART 100 UNIT/ML ~~LOC~~ SOLN
0.0000 [IU] | Freq: Three times a day (TID) | SUBCUTANEOUS | Status: DC
  Administered 2018-09-13: 17:00:00 3 [IU] via SUBCUTANEOUS
  Administered 2018-09-13 (×2): 2 [IU] via SUBCUTANEOUS
  Administered 2018-09-14: 3 [IU] via SUBCUTANEOUS
  Administered 2018-09-14 (×2): 2 [IU] via SUBCUTANEOUS
  Administered 2018-09-15: 3 [IU] via SUBCUTANEOUS
  Filled 2018-09-12 (×7): qty 1

## 2018-09-12 MED ORDER — ATENOLOL 25 MG PO TABS
25.0000 mg | ORAL_TABLET | Freq: Every evening | ORAL | Status: DC
  Administered 2018-09-13: 25 mg via ORAL
  Filled 2018-09-12: qty 1

## 2018-09-12 MED ORDER — HEPARIN SODIUM (PORCINE) 5000 UNIT/ML IJ SOLN
5000.0000 [IU] | Freq: Three times a day (TID) | INTRAMUSCULAR | Status: DC
  Administered 2018-09-12 – 2018-09-14 (×5): 5000 [IU] via SUBCUTANEOUS
  Filled 2018-09-12 (×5): qty 1

## 2018-09-12 MED ORDER — ACETAMINOPHEN 650 MG RE SUPP
975.0000 mg | Freq: Once | RECTAL | Status: AC
  Administered 2018-09-12: 975 mg via RECTAL
  Filled 2018-09-12: qty 1

## 2018-09-12 MED ORDER — SODIUM CHLORIDE 0.9 % IV SOLN
INTRAVENOUS | Status: DC
  Administered 2018-09-12 – 2018-09-13 (×2): via INTRAVENOUS

## 2018-09-12 MED ORDER — ROSUVASTATIN CALCIUM 10 MG PO TABS
20.0000 mg | ORAL_TABLET | Freq: Every day | ORAL | Status: DC
  Administered 2018-09-13 – 2018-09-19 (×7): 20 mg via ORAL
  Filled 2018-09-12 (×7): qty 2

## 2018-09-12 MED ORDER — VANCOMYCIN HCL IN DEXTROSE 1-5 GM/200ML-% IV SOLN
1000.0000 mg | INTRAVENOUS | Status: DC
  Administered 2018-09-13 – 2018-09-17 (×5): 1000 mg via INTRAVENOUS
  Filled 2018-09-12 (×6): qty 200

## 2018-09-12 MED ORDER — ONDANSETRON HCL 4 MG/2ML IJ SOLN: 4.0000 mg | Freq: Four times a day (QID) | INTRAMUSCULAR | Status: DC | PRN

## 2018-09-12 MED ORDER — SODIUM CHLORIDE 0.9 % IV BOLUS
1000.0000 mL | Freq: Once | INTRAVENOUS | Status: AC
  Administered 2018-09-12: 1000 mL via INTRAVENOUS

## 2018-09-12 MED ORDER — SODIUM CHLORIDE 0.9 % IV SOLN
0.0000 ug/min | INTRAVENOUS | Status: DC
  Administered 2018-09-13 – 2018-09-14 (×2): 20 ug/min via INTRAVENOUS
  Filled 2018-09-12: qty 1
  Filled 2018-09-12 (×2): qty 10

## 2018-09-12 MED ORDER — CITALOPRAM HYDROBROMIDE 20 MG PO TABS
20.0000 mg | ORAL_TABLET | Freq: Every day | ORAL | Status: DC
  Administered 2018-09-13 – 2018-09-19 (×7): 20 mg via ORAL
  Filled 2018-09-12 (×8): qty 1

## 2018-09-12 MED ORDER — HYDROCODONE-ACETAMINOPHEN 5-325 MG PO TABS
1.0000 | ORAL_TABLET | ORAL | Status: DC | PRN
  Administered 2018-09-12 – 2018-09-17 (×5): 1 via ORAL
  Administered 2018-09-19 (×2): 2 via ORAL
  Filled 2018-09-12 (×4): qty 1
  Filled 2018-09-12 (×3): qty 2

## 2018-09-12 MED ORDER — DOCUSATE SODIUM 100 MG PO CAPS: 100.0000 mg | ORAL_CAPSULE | Freq: Two times a day (BID) | ORAL | Status: DC

## 2018-09-12 MED ORDER — VANCOMYCIN HCL 10 G IV SOLR
2000.0000 mg | Freq: Once | INTRAVENOUS | Status: AC
  Administered 2018-09-12: 2000 mg via INTRAVENOUS
  Filled 2018-09-12: qty 2000

## 2018-09-12 MED ORDER — SODIUM CHLORIDE 0.9 % IV SOLN
2.0000 g | Freq: Once | INTRAVENOUS | Status: AC
  Administered 2018-09-12: 2 g via INTRAVENOUS
  Filled 2018-09-12: qty 20

## 2018-09-12 MED ORDER — SODIUM BICARBONATE 8.4 % IV SOLN
INTRAVENOUS | Status: DC
  Administered 2018-09-12 – 2018-09-13 (×2): via INTRAVENOUS
  Filled 2018-09-12 (×9): qty 150

## 2018-09-12 MED ORDER — LORATADINE 10 MG PO TABS
10.0000 mg | ORAL_TABLET | Freq: Every day | ORAL | Status: DC
  Administered 2018-09-13 – 2018-09-19 (×7): 10 mg via ORAL
  Filled 2018-09-12 (×8): qty 1

## 2018-09-12 MED ORDER — ALBUTEROL SULFATE HFA 108 (90 BASE) MCG/ACT IN AERS: 2.0000 | INHALATION_SPRAY | Freq: Four times a day (QID) | RESPIRATORY_TRACT | Status: DC

## 2018-09-12 MED ORDER — CIPROFLOXACIN IN D5W 400 MG/200ML IV SOLN
400.0000 mg | Freq: Once | INTRAVENOUS | Status: AC
  Administered 2018-09-12: 400 mg via INTRAVENOUS
  Filled 2018-09-12: qty 200

## 2018-09-12 MED ORDER — ACETAMINOPHEN 650 MG RE SUPP
650.0000 mg | Freq: Four times a day (QID) | RECTAL | Status: DC | PRN
  Administered 2018-09-17 – 2018-09-20 (×3): 650 mg via RECTAL
  Filled 2018-09-12 (×3): qty 1

## 2018-09-12 MED ORDER — HYDROCODONE-ACETAMINOPHEN 5-325 MG PO TABS
2.0000 | ORAL_TABLET | ORAL | Status: DC | PRN
  Administered 2018-09-13 – 2018-09-18 (×7): 2 via ORAL
  Filled 2018-09-12 (×7): qty 2

## 2018-09-12 MED ORDER — ASPIRIN EC 81 MG PO TBEC
81.0000 mg | DELAYED_RELEASE_TABLET | Freq: Every day | ORAL | Status: DC
  Administered 2018-09-13 – 2018-10-12 (×26): 81 mg via ORAL
  Filled 2018-09-12 (×27): qty 1

## 2018-09-12 MED ORDER — ALBUTEROL SULFATE (2.5 MG/3ML) 0.083% IN NEBU
2.5000 mg | INHALATION_SOLUTION | Freq: Four times a day (QID) | RESPIRATORY_TRACT | Status: DC
  Administered 2018-09-12 – 2018-09-15 (×10): 2.5 mg via RESPIRATORY_TRACT
  Filled 2018-09-12 (×11): qty 3

## 2018-09-12 MED ORDER — TAMSULOSIN HCL 0.4 MG PO CAPS
0.4000 mg | ORAL_CAPSULE | Freq: Every day | ORAL | Status: DC
  Administered 2018-09-12 – 2018-09-13 (×2): 0.4 mg via ORAL
  Filled 2018-09-12 (×2): qty 1

## 2018-09-12 NOTE — ED Notes (Signed)
Pt cleaned of large stool.

## 2018-09-12 NOTE — ED Notes (Signed)
MD stafford notified of pt temp and declining mental status. RN informed patient was going to take temperature while in room and 2 minutes later before RN took temp patient was asking was it was.  Becoming regularly repetative in questions and not remembering conversations had with RN.

## 2018-09-12 NOTE — Progress Notes (Signed)
   [place] at [facility] Day: 0 days Isaac Cross is a 58 y.o. male presenting with Dizziness .   Advance care planning discussed with patient at bedside and his wife over the phone. Acute diagnoses-sepsis and acute renal failure Chronic diagnosis-recent bilateral knee surgeries, diabetes, hypertension  All questions in regards to overall condition and expected prognosis answered. The decision was made to continue current code status -Patient wants aggressive care and full resuscitation in case of a cardiac arrest.  CODE STATUS: Full code Time spent: 18 minutes

## 2018-09-12 NOTE — Progress Notes (Signed)
CODE SEPSIS - PHARMACY COMMUNICATION  **Broad Spectrum Antibiotics should be administered within 1 hour of Sepsis diagnosis**  Time Code Sepsis Called/Page Received: 09:45  Antibiotics Ordered: Ceftriaxone, Cipro, Vancomycin  Time of 1st antibiotic administration: Ceftriaxone given at 11:18, delayed due to difficulty obtaining cultures   Vira Blanco ,PharmD Clinical Pharmacist  09/12/2018  11:44 AM

## 2018-09-12 NOTE — Progress Notes (Signed)
Pharmacy Antibiotic Note  Isaac Cross is a 58 y.o. male admitted on 09/12/2018 with septic arthritis?.  Pharmacy has been consulted for Vancomycin dosing. Patient s/p bilateral TKR 09/01/2018 CTX x 1 and Cipro x 1 in ER  Plan: Patient received Vancomycin 2000 mg IV x 1 in ER. Scr 3.71  Crcl 33 ml/min  (note Scr from 06/12/2018 = 1.07) Will continue with: Vancomycin 1000 mg IV Q 24 hrs. Goal AUC 400-550. Expected AUC: 531 SCr used: 3.71         BMI 38    *Watch Renal fxn*    Height: 6\' 4"  (193 cm) Weight: (!) 320 lb (145.2 kg) IBW/kg (Calculated) : 86.8  Temp (24hrs), Avg:100.7 F (38.2 C), Min:99.6 F (37.6 C), Max:102.7 F (39.3 C)  Recent Labs  Lab 09/12/18 0955 09/12/18 1301  WBC 16.1*  --   CREATININE 3.71*  --   LATICACIDVEN 4.9* 2.0*    Estimated Creatinine Clearance: 33 mL/min (A) (by C-G formula based on SCr of 3.71 mg/dL (H)).    Allergies  Allergen Reactions  . Lipitor [Atorvastatin] Hives and Itching  . Other   . Viagra  [Sildenafil Citrate]     vision loss    Antimicrobials this admission: cipro 5/26 >>   CTX 5/26 >>   Vanc 5/26 >>  Dose adjustments this admission:    Microbiology results: 5/26 BCx: pending 5/26 UCx:  pending   Sputum:      MRSA PCR:    Thank you for allowing pharmacy to be a part of this patient's care.  Joshva Labreck A 09/12/2018 2:23 PM

## 2018-09-12 NOTE — ED Notes (Signed)
Report received. Care assumed.

## 2018-09-12 NOTE — ED Provider Notes (Signed)
[facility] Emergency Department Provider Note  ____________________________________________  Time seen: Approximately 10:23 AM  I have reviewed the triage vital signs and the nursing notes.   HISTORY  Chief Complaint Dizziness    HPI DESTON BILYEU is a 58 y.o. male with a history of COPD, diabetes, hypertension, GERD, recently postop from bilateral total knee replacement on May 15 by Dr. Harlow Mares who comes the ED complaining of worsening generalized weakness, dizziness, and fever for the past 2 to 3 days.  Temperatures been as high as 102 at home.  He reports that he was started on antibiotics (Bactrim and Keflex) 5 days ago for fever by his orthopedics group.  This morning due to the weakness he rolled out of bed and fell onto his left knee causing much more severe pain.  Denies any pain in the right knee.  Left knee pain is worse with bending the knee.  Denies chest pain or shortness of breath.  He has been compliant with the Xarelto.  Knee swelling is constant since surgery, not worsening.   Past Medical History:  Diagnosis Date  . Allergy   . Anxiety   . Arthritis    knees  . Asthma, mild intermittent, well-controlled   . Chronic obstructive asthma ([place])   . COPD (chronic obstructive pulmonary disease) ([place])   . Depression with anxiety   . Diabetes ([place])   . GERD (gastroesophageal reflux disease)   . HBP (high blood pressure)   . High cholesterol   . Renal cyst, right   . Sleep apnea    CPAP  . Swelling      Patient Active Problem List   Diagnosis Date Noted  . Sepsis ([place]) 09/12/2018  . History of total knee arthroplasty 09/04/2018  . Coronary atherosclerosis due to calcified coronary lesion 12/02/2017  . Atherosclerosis of abdominal aorta ([place]) 06/17/2015  . Fatty liver disease, nonalcoholic 27/78/2423  . Renal cyst, right 06/17/2015  . BPH (benign prostatic hyperplasia) 05/25/2015  . Microscopic hematuria 05/25/2015  . Second degree  hemorrhoids   . Diverticulosis of large intestine without diverticulitis   . Asthma, mild intermittent, well-controlled 10/16/2014  . Allergic rhinitis 10/13/2014  . ED (erectile dysfunction) of organic origin 07/22/2014  . Gastro-esophageal reflux disease without esophagitis 07/22/2014  . Benign hypertension 07/22/2014  . 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 Adult BMI 30+ 07/22/2014  . Chronic obstructive asthma ([place]) 07/22/2014  . Depression with anxiety 07/22/2014  . Dyslipidemia 07/22/2014  . Genital herpes 07/22/2014  . 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 Osteoarthritis of both knees 07/22/2014  . Obstructive apnea 05/14/2013  . 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 Blood in the urine 09/20/2006     Past Surgical History:  Procedure Laterality Date  . BLADDER REPAIR    . COLONOSCOPY WITH PROPOFOL N/A 04/07/2015   Procedure: COLONOSCOPY WITH PROPOFOL;  Surgeon: Lucilla Lame, MD;  Location: [facility];  Service: Endoscopy;  Laterality: N/A;  Diabetic - oral meds CPAP  . HAND SURGERY     4TH AND 5TH FINGER  . KNEE ARTHROSCOPY Right   . 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 VEIN SURGERY Right      Prior to Admission medications   Medication Sig Start Date End Date Taking? Authorizing Provider  acetaminophen (TYLENOL) 500 MG tablet Take 1 tablet (500 mg total) by mouth every 6 (six) hours as needed. 12/08/16  Yes Steele Sizer, MD  albuterol (PROVENTIL HFA;VENTOLIN HFA)  108 (90 Base) MCG/ACT inhaler INHALE 2 PUFFS INTO THE LUNGS 4 (FOUR) TIMES DAILY. 04/02/18  Yes Steele Sizer, MD  aspirin 81 MG tablet Take 81 mg by mouth daily.   Yes [provider]  atenolol (TENORMIN) 25 MG tablet Take 1 tablet (25 mg total) by mouth every evening. 06/07/18  Yes Sowles, Drue Stager, MD  cephALEXin (KEFLEX) 500 MG capsule Take 500 mg by mouth 3 (three) times daily. 09/11/18  Yes [provider]  citalopram  (CELEXA) 20 MG tablet Take 1 tablet (20 mg total) by mouth daily. 06/07/18  Yes Sowles, Drue Stager, MD  fluticasone furoate-vilanterol (BREO ELLIPTA) 100-25 MCG/INH AEPB Inhale 1 puff into the lungs daily. 12/02/17  Yes Sowles, Drue Stager, MD  lisinopril (PRINIVIL,ZESTRIL) 40 MG tablet Take 1 tablet (40 mg total) by mouth daily. 06/07/18  Yes Sowles, Drue Stager, MD  metFORMIN (GLUCOPHAGE) 1000 MG tablet Take 1 tablet (1,000 mg total) by mouth 2 (two) times daily with a meal. 06/07/18  Yes Sowles, Drue Stager, MD  Multiple Vitamins-Minerals (MENS MULTIVITAMIN PLUS) TABS Take by mouth.   Yes [provider]  Omega-3 Fatty Acids (FISH OIL) 1000 MG CAPS Take by mouth daily.   Yes [provider]  oxyCODONE-acetaminophen (PERCOCET/ROXICET) 5-325 MG tablet Take 1 tablet by mouth every 4 (four) hours.   Yes [provider]  rosuvastatin (CRESTOR) 20 MG tablet TAKE 1 TABLET BY MOUTH EVERY DAY 05/10/18  Yes Sowles, Drue Stager, MD  SENNA-PLUS 8.6-50 MG tablet Take 2 tablets by mouth every morning. 09/02/18  Yes [provider]  sulfamethoxazole-trimethoprim (BACTRIM DS) 800-160 MG tablet Take 1 tablet by mouth 2 (two) times a day. 09/11/18  Yes [provider]  tamsulosin (FLOMAX) 0.4 MG CAPS capsule Take 1 capsule (0.4 mg total) by mouth daily. 09/05/18  Yes Sowles, Drue Stager, MD  XARELTO 10 MG TABS tablet Take 10 mg by mouth daily.  09/02/18  Yes [provider]  cetirizine (ZYRTEC) 10 MG tablet Take 10 mg by mouth daily.    [provider]  diclofenac sodium (VOLTAREN) 1 % GEL APPLY 4 GRAMS TOPICALLY FOUR TIMES A DAY 07/13/17   Ancil Boozer, Drue Stager, MD  lidocaine (XYLOCAINE) 2 % solution Use as directed 15 mLs in the mouth or throat as needed for mouth pain. 03/15/18   Steele Sizer, MD  triamcinolone cream (KENALOG) 0.1 % triamcinolone acetonide 0.1 % topical cream    [provider]     Allergies Lipitor [atorvastatin]; Other; and Viagra  [sildenafil  citrate]   Family History  Problem Relation Age of Onset  . Hypertension Mother   . Diabetes Mother   . Diabetes Father   . Hypertension Father   . Cancer Father   . Seizures Daughter   . Lupus Daughter   . Cancer Maternal Grandfather        Prostate  . Diabetes Paternal Grandfather   . Prostate cancer Maternal Uncle   . Bladder Cancer Neg Hx   . Kidney cancer Neg Hx     Social History Social History   Tobacco Use  . Smoking status: Former Smoker    Packs/day: 1.00    Years: 10.00    Pack years: 10.00    Types: Cigarettes    Start date: 04/20/1995    Last attempt to quit: 04/19/2005    Years since quitting: 13.4  . Smokeless tobacco: Never Used  Substance Use Topics  . Alcohol use: Yes    Alcohol/week: 10.0 standard drinks    Types: 10 Cans of  beer per week    Comment: occasional  . Drug use: No    Review of Systems  Constitutional: Positive fever ENT:   No sore throat. No rhinorrhea. Cardiovascular:   No chest pain or syncope. Respiratory:   No dyspnea or cough. Gastrointestinal:   Negative for abdominal pain, vomiting and diarrhea.  Musculoskeletal: Positive bilateral knee swelling, and severe left knee pain. All other systems reviewed and are negative except as documented above in ROS and HPI.  ____________________________________________   PHYSICAL EXAM:  VITAL SIGNS: ED Triage Vitals  Enc Vitals Group     BP 09/12/18 0942 (!) 147/107     Pulse Rate 09/12/18 0938 (!) 110     Resp 09/12/18 0938 (!) 25     Temp 09/12/18 0938 99.6 F (37.6 C)     Temp Source 09/12/18 0938 Oral     SpO2 09/12/18 0948 92 %     Weight 09/12/18 0935 (!) 320 lb (145.2 kg)     Height 09/12/18 0935 6\' 4"  (1.93 m)     Head Circumference --      Peak Flow --      Pain Score 09/12/18 0935 5     Pain Loc --      Pain Edu? --      Excl. in [place]? --     Vital signs reviewed, nursing assessments reviewed.   Constitutional:   Alert and oriented.  Ill-appearing Eyes:    Conjunctivae are normal. EOMI. PERRL. ENT      Head:   Normocephalic and atraumatic.      Nose:   No congestion/rhinnorhea.       Mouth/Throat:   Dry mucous membranes, no pharyngeal erythema. No peritonsillar mass.       Neck:   No meningismus. Full ROM. Hematological/Lymphatic/Immunilogical:   No cervical lymphadenopathy. Cardiovascular:   Tachycardia heart rate 110. Symmetric bilateral radial and DP pulses.  No murmurs. Cap refill less than 2 seconds. Respiratory:   Normal respiratory effort.  Tachypnea, respiratory rate 25.  Breath sounds are clear bilaterally without crackles or wheezes. Gastrointestinal:   Soft and nontender. Non distended. There is no CVA tenderness.  No rebound, rigidity, or guarding.  Musculoskeletal: Bilateral knee effusion.  Left knee is ballotable.  Severe pain with movement of the left knee, no tenderness on palpation of the left knee joint line or effusion. Neurologic:   Normal speech and language.  Motor grossly intact. No acute focal neurologic deficits are appreciated.  Skin:    Skin is warm, dry and intact. No rash noted.  No petechiae, purpura, or bullae.  ____________________________________________    LABS (pertinent positives/negatives) (all labs ordered are listed, but only abnormal results are displayed) Labs Reviewed  SEDIMENTATION RATE - Abnormal; Notable for the following components:      Result Value   Sed Rate 64 (*)    All other components within normal limits  LACTIC ACID, PLASMA - Abnormal; Notable for the following components:   Lactic Acid, Venous 4.9 (*)    All other components within normal limits  LACTIC ACID, PLASMA - Abnormal; Notable for the following components:   Lactic Acid, Venous 2.0 (*)    All other components within normal limits  COMPREHENSIVE METABOLIC PANEL - Abnormal; Notable for the following components:   Sodium 126 (*)    Chloride 90 (*)    CO2 17 (*)    Glucose, Bld 129 (*)    BUN 28 (*)    Creatinine, Ser  3.71 (*)    Albumin 3.0 (*)    AST 70 (*)    GFR calc non Af Amer 17 (*)    GFR calc Af Amer 19 (*)    Anion gap 19 (*)    All other components within normal limits  LIPASE, BLOOD - Abnormal; Notable for the following components:   Lipase 74 (*)    All other components within normal limits  CBC WITH DIFFERENTIAL/PLATELET - Abnormal; Notable for the following components:   WBC 16.1 (*)    Hemoglobin 11.5 (*)    HCT 35.7 (*)    Platelets 137 (*)    Neutro Abs 10.3 (*)    Monocytes Absolute 1.5 (*)    Abs Immature Granulocytes 0.48 (*)    All other components within normal limits  PROTIME-INR - Abnormal; Notable for the following components:   Prothrombin Time 15.3 (*)    All other components within normal limits  URINALYSIS, COMPLETE (UACMP) WITH MICROSCOPIC - Abnormal; Notable for the following components:   Color, Urine AMBER (*)    APPearance TURBID (*)    Ketones, ur 5 (*)    Protein, ur 100 (*)    All other components within normal limits  HEMOGLOBIN A1C - Abnormal; Notable for the following components:   Hgb A1c MFr Bld 6.9 (*)    All other components within normal limits  BASIC METABOLIC PANEL - Abnormal; Notable for the following components:   Sodium 126 (*)    Chloride 96 (*)    CO2 18 (*)    Glucose, Bld 196 (*)    BUN 34 (*)    Creatinine, Ser 4.31 (*)    Calcium 7.9 (*)    GFR calc non Af Amer 14 (*)    GFR calc Af Amer 16 (*)    All other components within normal limits  CBC - Abnormal; Notable for the following components:   RBC 3.26 (*)    Hemoglobin 8.7 (*)    HCT 26.7 (*)    Platelets 137 (*)    All other components within normal limits  GLUCOSE, CAPILLARY - Abnormal; Notable for the following components:   Glucose-Capillary 167 (*)    All other components within normal limits  GLUCOSE, CAPILLARY - Abnormal; Notable for the following components:   Glucose-Capillary 218 (*)    All other components within normal limits  CULTURE, BLOOD (ROUTINE X 2)   CULTURE, BLOOD (ROUTINE X 2)  SARS CORONAVIRUS 2 (HOSPITAL ORDER, [facility])  MRSA PCR SCREENING  URINE CULTURE  PROCALCITONIN  APTT  LACTIC ACID, PLASMA  LACTIC ACID, PLASMA  PROCALCITONIN  ANA COMPREHENSIVE PANEL  GLOMERULAR BASEMENT MEMBRANE ANTIBODIES  PROTEIN ELECTRO, RANDOM URINE  PROTEIN ELECTROPHORESIS, SERUM  HIV ANTIBODY (ROUTINE TESTING W REFLEX)   ____________________________________________   EKG  Interpreted by me Sinus tachycardia rate 106, normal axis and intervals.  Poor R wave progression.  Normal ST segments and T waves.  ____________________________________________    RADIOLOGY  Dg Chest Port 1 View  Result Date: 09/12/2018 CLINICAL DATA:  58 year old male with a history of increasing dizziness EXAM: PORTABLE CHEST 1 VIEW COMPARISON:  11/09/2006 FINDINGS: Cardiomediastinal silhouette unchanged in size and contour. No pneumothorax or pleural effusion. Low lung volumes with coarsened interstitial markings. Linear opacity at the left lung base with no confluent airspace disease. IMPRESSION: Low lung volumes without evidence of acute cardiopulmonary disease. Electronically Signed   By: Corrie Mckusick D.O.   On: 09/12/2018 10:19  Dg Knee Complete 4 Views Left  Result Date: 09/12/2018 CLINICAL DATA:  58 year old male with a history of knee replacement EXAM: LEFT KNEE - COMPLETE 4+ VIEW COMPARISON:  None. FINDINGS: No acute displaced fracture. Surgical changes of arthroplasty left knee. Joint effusion. Surgical staples project within the soft tissues of the midline. Soft tissue swelling at the surgical site. IMPRESSION: Early surgical changes of left knee arthroplasty, with soft tissue swelling and joint effusion. Electronically Signed   By: Corrie Mckusick D.O.   On: 09/12/2018 10:15    ____________________________________________   PROCEDURES .Critical Care Performed by: Carrie Mew, MD Authorized by: Carrie Mew, MD    Critical care provider statement:    Critical care time (minutes):  35   Critical care time was exclusive of:  Separately billable procedures and treating other patients   Critical care was necessary to treat or prevent imminent or life-threatening deterioration of the following conditions:  Sepsis and renal failure   Critical care was time spent personally by me on the following activities:  Development of treatment plan with patient or surrogate, discussions with consultants, evaluation of patient's response to treatment, examination of patient, obtaining history from patient or surrogate, ordering and performing treatments and interventions, ordering and review of laboratory studies, ordering and review of radiographic studies, pulse oximetry, re-evaluation of patient's condition and review of old charts    ____________________________________________  DIFFERENTIAL DIAGNOSIS   Sepsis, pneumonia, UTI, COVID infection, septic arthritis.  Doubt intra-abdominal pathology, necrotizing fasciitis, cellulitis, abscess.  CLINICAL IMPRESSION / ASSESSMENT AND PLAN / ED COURSE  Medications ordered in the ED: Medications  vancomycin (VANCOCIN) IVPB 1000 mg/200 mL premix (has no administration in time range)  fluticasone furoate-vilanterol (BREO ELLIPTA) 100-25 MCG/INH 1 puff (has no administration in time range)  loratadine (CLARITIN) tablet 10 mg (has no administration in time range)  tamsulosin (FLOMAX) capsule 0.4 mg (0.4 mg Oral Given 09/12/18 2247)  citalopram (CELEXA) tablet 20 mg (has no administration in time range)  rosuvastatin (CRESTOR) tablet 20 mg (has no administration in time range)  atenolol (TENORMIN) tablet 25 mg (25 mg Oral Not Given 09/12/18 2000)  heparin injection 5,000 Units (5,000 Units Subcutaneous Given 09/13/18 0540)  acetaminophen (TYLENOL) tablet 650 mg (650 mg Oral Given 09/13/18 0332)    Or  acetaminophen (TYLENOL) suppository 650 mg ( Rectal See Alternative 09/13/18  0332)  0.9 %  sodium chloride infusion ( Intravenous Rate/Dose Change 09/13/18 0605)  HYDROcodone-acetaminophen (NORCO/VICODIN) 5-325 MG per tablet 1-2 tablet (1 tablet Oral Given 09/12/18 2247)  ondansetron (ZOFRAN) tablet 4 mg (has no administration in time range)    Or  ondansetron (ZOFRAN) injection 4 mg (has no administration in time range)  insulin aspart (novoLOG) injection 0-9 Units (has no administration in time range)  insulin aspart (novoLOG) injection 0-5 Units (0 Units Subcutaneous Not Given 09/12/18 2251)  ceFEPIme (MAXIPIME) 2 g in sodium chloride 0.9 % 100 mL IVPB ( Intravenous Stopped 09/12/18 2320)  sodium bicarbonate 150 mEq in dextrose 5 % 1,000 mL infusion ( Intravenous Rate/Dose Change 09/13/18 0605)  albuterol (PROVENTIL) (2.5 MG/3ML) 0.083% nebulizer solution 2.5 mg (2.5 mg Nebulization Given 09/13/18 0220)  aspirin EC tablet 81 mg (has no administration in time range)  HYDROcodone-acetaminophen (NORCO/VICODIN) 5-325 MG per tablet 2 tablet (2 tablets Oral Given 09/13/18 0540)  phenylephrine (NEO-SYNEPHRINE) 10 mg in sodium chloride 0.9 % 250 mL (0.04 mg/mL) infusion (0 mcg/min Intravenous Not Given 09/12/18 2000)  docusate sodium (COLACE) capsule 100 mg (has no administration in  time range)  vancomycin (VANCOCIN) 2,000 mg in sodium chloride 0.9 % 500 mL IVPB (0 mg Intravenous Stopped 09/12/18 1423)  cefTRIAXone (ROCEPHIN) 2 g in sodium chloride 0.9 % 100 mL IVPB (0 g Intravenous Stopped 09/12/18 1150)  ciprofloxacin (CIPRO) IVPB 400 mg (0 mg Intravenous Stopped 09/12/18 1227)  sodium chloride 0.9 % bolus 1,000 mL (0 mLs Intravenous Stopped 09/12/18 1227)  sodium chloride 0.9 % bolus 1,000 mL (0 mLs Intravenous Stopped 09/12/18 1227)  sodium chloride 0.9 % bolus 1,000 mL (0 mLs Intravenous Stopped 09/12/18 1340)  sodium chloride 0.9 % bolus 1,500 mL (0 mLs Intravenous Stopped 09/12/18 1331)  acetaminophen (TYLENOL) suppository 975 mg (975 mg Rectal Given 09/12/18 1338)    Pertinent  labs & imaging results that were available during my care of the patient were reviewed by me and considered in my medical decision making (see chart for details).  ELEK HOLDERNESS was evaluated in Emergency Department on 09/13/2018 for the symptoms described in the history of present illness. He was evaluated in the context of the global COVID-19 pandemic, which necessitated consideration that the patient might be at risk for infection with the SARS-CoV-2 virus that causes COVID-19. Institutional protocols and algorithms that pertain to the evaluation of patients at risk for COVID-19 are in a state of rapid change based on information released by regulatory bodies including the CDC and federal and state organizations. These policies and algorithms were followed during the patient's care in the ED.   Patient presents with left knee pain after falling on the knee, fever tachycardia tachypnea concerning for sepsis.  No clinically apparent source, sepsis protocol initiated.  Will treat with vancomycin, cefepime, Cipro.  Initial bolus of IV fluids for tachycardia.  Clinical Course as of Sep 12 721  Tue Sep 12, 2018  1131 Lactate of approximately 5.  Labs also show AKI with a creatinine of 3.7 compared to a baseline of 1, high anion gap metabolic acidosis, hyponatremia, leukocytosis, all consistent with severe sepsis/septic shock.  Continue antibiotics, large volume IV fluid resuscitation, will page orthopedics and plan for admission.   [PS]  1497 Pt updated. Sepsis reassessment completed. Declines additional pain meds at this time. Will keep npo until discussed with ortho   [PS]  1223 D/w Dr. Harlow Mares, encourages being sure it's not UTI with h/o BPH, and it being early in post op course for joint infection to present. Continuing abx and large volume fluid resus. Awaiting covid test, but labs all point to bacterial infection.    [PS]  1308 After 4 L of fluids, patient has 50 mL of urine output on placing a  Foley, consistent with acute renal failure from sepsis..   [PS]  1329 Fever elevated to 102.7.  I will give a Tylenol suppository.   [PS]  0263 Still tachycardic to 110. Lactate improved to 2.0. bp normal. Still no meningismus. Persistent pain with knee ROM. No UTI, covid negative. Will d/w ortho again.    [PS]    Clinical Course User Index [PS] Carrie Mew, MD     ____________________________________________   FINAL CLINICAL IMPRESSION(S) / ED DIAGNOSES    Final diagnoses:  Septic shock ([place])  Acute renal failure, unspecified acute renal failure type ([place])  Other specified diabetes mellitus with other specified complication, unspecified whether long term insulin use [facility])     ED Discharge Orders    None      Portions of this note were generated with dragon dictation software. Dictation errors may occur  despite best attempts at proofreading.   Carrie Mew, MD 09/13/18 [id number]

## 2018-09-12 NOTE — ED Notes (Signed)
Pt seems forgetful. Did not remember MD updating him or RN telling him he was going to be admitted.  Remains oriented X 3 but thought was in middle of night. Dr Joni Fears notified.

## 2018-09-12 NOTE — ED Notes (Signed)
ED TO INPATIENT HANDOFF REPORT  ED Nurse Name and Phone #: Natale Barba 63  S Name/Age/Gender Isaac Cross 58 y.o. male Room/Bed: ED26A/ED26A  Code Status   Code Status: Not on file  Home/SNF/Other Home Patient oriented to: self, place, time and situation Is this baseline? Yes   Triage Complete: Triage complete  Chief Complaint dizziness ems  Triage Note Here for increasing dizziness over past 2 days. Pt had bilateral knee replacement 5/15 and started with fevers over past 3 days ago and he was given abx but no labs/imaging done prior to prescription. Still having fevers. Took tylenol at 800 am today. 102 earlier per pt. Pt rolled out of bed and landed on left knee, increased pain.   Allergies Allergies  Allergen Reactions  . Lipitor [Atorvastatin] Hives and Itching  . Other   . Viagra  [Sildenafil Citrate]     vision loss    Level of Care/Admitting Diagnosis ED Disposition    ED Disposition Condition [facility] Area: [place] [[id number]]  Level of Care: Med-Surg [16]  Covid Evaluation: N/A  Diagnosis: Sepsis [facility]) [[id number]]  Admitting Physician: Gladstone Lighter [[id number]]  Attending Physician: Gladstone Lighter [[id number]]  Estimated length of stay: past midnight tomorrow  Certification:: I certify this patient will need inpatient services for at least 2 midnights  PT Class (Do Not Modify): Inpatient [101]  PT Acc Code (Do Not Modify): Private [1]       B Medical/Surgery History Past Medical History:  Diagnosis Date  . Allergy   . Anxiety   . Arthritis    knees  . Asthma, mild intermittent, well-controlled   . Chronic obstructive asthma ([place])   . COPD (chronic obstructive pulmonary disease) ([place])   . Depression with anxiety   . Diabetes ([place])   . GERD (gastroesophageal reflux disease)   . HBP (high blood pressure)   . High cholesterol   . Renal cyst, right   . Sleep apnea    CPAP  . Swelling    Past Surgical  History:  Procedure Laterality Date  . BLADDER REPAIR    . COLONOSCOPY WITH PROPOFOL N/A 04/07/2015   Procedure: COLONOSCOPY WITH PROPOFOL;  Surgeon: Lucilla Lame, MD;  Location: [facility];  Service: Endoscopy;  Laterality: N/A;  Diabetic - oral meds CPAP  . HAND SURGERY     4TH AND 5TH FINGER  . KNEE ARTHROSCOPY Right   . 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 VEIN SURGERY Right      A IV Location/Drains/Wounds Patient Lines/Drains/Airways Status   Active Line/Drains/Airways    Name:   Placement date:   Placement time:   Site:   Days:   Peripheral IV 09/12/18 Right Forearm   09/12/18    0950    Forearm   less than 1   Peripheral IV 09/12/18 Left Hand   09/12/18    0954    Hand   less than 1   Urethral Catheter Carleta Woodrow rn  Latex   09/12/18    1304    Latex   less than 1          Intake/Output Last 24 hours  Intake/Output Summary (Last 24 hours) at 09/12/2018 1538 Last data filed at 09/12/2018 1227 Gross per 24 hour  Intake 4500 ml  Output -  Net 4500 ml    Labs/Imaging Results for orders placed or performed during the hospital encounter of 09/12/18 (from the past 48 hour(s))  Lactic  acid, plasma     Status: Abnormal   Collection Time: 09/12/18  9:55 AM  Result Value Ref Range   Lactic Acid, Venous 4.9 (HH) 0.5 - 1.9 mmol/L    Comment: CRITICAL RESULT CALLED TO, READ BACK BY AND VERIFIED WITH JANE RYAN AT 1026 ON 09/12/2018 [place]. Performed at [facility], [address] [place], [place] [address]   Comprehensive metabolic panel     Status: Abnormal   Collection Time: 09/12/18  9:55 AM  Result Value Ref Range   Sodium 126 (L) 135 - 145 mmol/L   Potassium 4.4 3.5 - 5.1 mmol/L   Chloride 90 (L) 98 - 111 mmol/L   CO2 17 (L) 22 - 32 mmol/L   Glucose, Bld 129 (H) 70 - 99 mg/dL   BUN 28 (H) 6 - 20 mg/dL   Creatinine, Ser 3.71 (H) 0.61 - 1.24 mg/dL   Calcium 9.7 8.9 - 10.3 mg/dL   Total Protein 7.6 6.5 - 8.1 g/dL   Albumin 3.0 (L)  3.5 - 5.0 g/dL   AST 70 (H) 15 - 41 U/L   ALT 42 0 - 44 U/L   Alkaline Phosphatase 86 38 - 126 U/L   Total Bilirubin 0.7 0.3 - 1.2 mg/dL   GFR calc non Af Amer 17 (L) >60 mL/min   GFR calc Af Amer 19 (L) >60 mL/min   Anion gap 19 (H) 5 - 15    Comment: Performed at [facility], [address] [place], [place] [address]  Lipase, blood     Status: Abnormal   Collection Time: 09/12/18  9:55 AM  Result Value Ref Range   Lipase 74 (H) 11 - 51 U/L    Comment: Performed at [facility], [address] [place], [place] [address]  CBC WITH DIFFERENTIAL     Status: Abnormal   Collection Time: 09/12/18  9:55 AM  Result Value Ref Range   WBC 16.1 (H) 4.0 - 10.5 K/uL   RBC 4.33 4.22 - 5.81 MIL/uL   Hemoglobin 11.5 (L) 13.0 - 17.0 g/dL   HCT 35.7 (L) 39.0 - 52.0 %   MCV 82.4 80.0 - 100.0 fL   MCH 26.6 26.0 - 34.0 pg   MCHC 32.2 30.0 - 36.0 g/dL   RDW 14.3 11.5 - 15.5 %   Platelets 137 (L) 150 - 400 K/uL    Comment: Immature Platelet Fraction may be clinically indicated, consider ordering this additional test [id number]    nRBC 0.2 0.0 - 0.2 %   Neutrophils Relative % 64 %   Neutro Abs 10.3 (H) 1.7 - 7.7 K/uL   Lymphocytes Relative 21 %   Lymphs Abs 3.4 0.7 - 4.0 K/uL   Monocytes Relative 9 %   Monocytes Absolute 1.5 (H) 0.1 - 1.0 K/uL   Eosinophils Relative 2 %   Eosinophils Absolute 0.3 0.0 - 0.5 K/uL   Basophils Relative 1 %   Basophils Absolute 0.1 0.0 - 0.1 K/uL   Immature Granulocytes 3 %   Abs Immature Granulocytes 0.48 (H) 0.00 - 0.07 K/uL    Comment: Performed at [facility], [place], [place] [address]  Procalcitonin     Status: None   Collection Time: 09/12/18  9:55 AM  Result Value Ref Range   Procalcitonin 6.97 ng/mL    Comment:        Interpretation: PCT > 2 ng/mL: Systemic infection (sepsis) is likely, unless other causes are known. (NOTE)  Sepsis PCT Algorithm           Lower Respiratory Tract                                       Infection PCT Algorithm    ----------------------------     ----------------------------         PCT < 0.25 ng/mL                PCT < 0.10 ng/mL         Strongly encourage             Strongly discourage   discontinuation of antibiotics    initiation of antibiotics    ----------------------------     -----------------------------       PCT 0.25 - 0.50 ng/mL            PCT 0.10 - 0.25 ng/mL               OR       >80% decrease in PCT            Discourage initiation of                                            antibiotics      Encourage discontinuation           of antibiotics    ----------------------------     -----------------------------         PCT >= 0.50 ng/mL              PCT 0.26 - 0.50 ng/mL               AND       <80% decrease in PCT              Encourage initiation of                                             antibiotics       Encourage continuation           of antibiotics    ----------------------------     -----------------------------        PCT >= 0.50 ng/mL                  PCT > 0.50 ng/mL               AND         increase in PCT                  Strongly encourage                                      initiation of antibiotics    Strongly encourage escalation           of antibiotics                                     -----------------------------  PCT <= 0.25 ng/mL                                                 OR                                        > 80% decrease in PCT                                     Discontinue / Do not initiate                                             antibiotics Performed at [facility], [address] [place], [place] [address]   APTT     Status: None   Collection Time: 09/12/18  9:55 AM  Result Value Ref Range   aPTT 33 24 - 36 seconds    Comment: Performed at [facility], [address] [place], [place] [address]  Protime-INR      Status: Abnormal   Collection Time: 09/12/18  9:55 AM  Result Value Ref Range   Prothrombin Time 15.3 (H) 11.4 - 15.2 seconds   INR 1.2 0.8 - 1.2    Comment: (NOTE) INR goal varies based on device and disease states. Performed at [facility], [address] [place], [place] [address]   Sedimentation rate     Status: Abnormal   Collection Time: 09/12/18  9:56 AM  Result Value Ref Range   Sed Rate 64 (H) 0 - 20 mm/hr    Comment: Performed at [facility], [address] [place], [place] [address]  Urinalysis, Complete w Microscopic     Status: Abnormal   Collection Time: 09/12/18 10:01 AM  Result Value Ref Range   Color, Urine AMBER (A) YELLOW    Comment: BIOCHEMICALS MAY BE AFFECTED BY COLOR   APPearance TURBID (A) CLEAR   Specific Gravity, Urine 1.026 1.005 - 1.030   pH 5.0 5.0 - 8.0   Glucose, UA NEGATIVE NEGATIVE mg/dL   Hgb urine dipstick NEGATIVE NEGATIVE   Bilirubin Urine NEGATIVE NEGATIVE   Ketones, ur 5 (A) NEGATIVE mg/dL   Protein, ur 100 (A) NEGATIVE mg/dL   Nitrite NEGATIVE NEGATIVE   Leukocytes,Ua NEGATIVE NEGATIVE   RBC / HPF 0-5 0 - 5 RBC/hpf   WBC, UA 6-10 0 - 5 WBC/hpf   Bacteria, UA NONE SEEN NONE SEEN   Squamous Epithelial / LPF 0-5 0 - 5   Mucus PRESENT    Amorphous Crystal PRESENT     Comment: Performed at [facility], [address] [place], [place] [address]  SARS Coronavirus 2 (CEPHEID- Performed in [place] hospital lab), Hosp Order     Status: None   Collection Time: 09/12/18 10:02 AM  Result Value Ref Range   SARS Coronavirus 2 NEGATIVE NEGATIVE    Comment: (NOTE) If result is NEGATIVE SARS-CoV-2 target nucleic acids are NOT DETECTED. The SARS-CoV-2 RNA is generally detectable in upper and lower  respiratory specimens during the acute phase of infection.  The lowest  concentration of SARS-CoV-2 viral copies this assay can detect is 250  copies / mL. A negative result does not preclude SARS-CoV-2 infection   and should not be used as the sole basis for treatment or other  patient management decisions.  A negative result may occur with  improper specimen collection / handling, submission of specimen other  than nasopharyngeal swab, presence of viral mutation(s) within the  areas targeted by this assay, and inadequate number of viral copies  (<250 copies / mL). A negative result must be combined with clinical  observations, patient history, and epidemiological information. If result is POSITIVE SARS-CoV-2 target nucleic acids are DETECTED. The SARS-CoV-2 RNA is generally detectable in upper and lower  respiratory specimens dur ing the acute phase of infection.  Positive  results are indicative of active infection with SARS-CoV-2.  Clinical  correlation with patient history and other diagnostic information is  necessary to determine patient infection status.  Positive results do  not rule out bacterial infection or co-infection with other viruses. If result is PRESUMPTIVE POSTIVE SARS-CoV-2 nucleic acids MAY BE PRESENT.   A presumptive positive result was obtained on the submitted specimen  and confirmed on repeat testing.  While 2019 novel coronavirus  (SARS-CoV-2) nucleic acids may be present in the submitted sample  additional confirmatory testing may be necessary for epidemiological  and / or clinical management purposes  to differentiate between  SARS-CoV-2 and other Sarbecovirus currently known to infect humans.  If clinically indicated additional testing with an alternate test  methodology [id number]) is advised. The SARS-CoV-2 RNA is generally  detectable in upper and lower respiratory sp ecimens during the acute  phase of infection. The expected result is Negative. Fact Sheet for Patients:  StrictlyIdeas.no Fact Sheet for Healthcare Providers: BankingDealers.co.za This test is not yet approved or cleared by the [place] FDA  and has been authorized for detection and/or diagnosis of SARS-CoV-2 by FDA under an Emergency Use Authorization (EUA).  This EUA will remain in effect (meaning this test can be used) for the duration of the COVID-19 declaration under Section 564(b)(1) of the Act, 21 U.S.C. section 360bbb-3(b)(1), unless the authorization is terminated or revoked sooner. Performed at [facility], [place], [place] [address]   Lactic acid, plasma     Status: Abnormal   Collection Time: 09/12/18  1:01 PM  Result Value Ref Range   Lactic Acid, Venous 2.0 (HH) 0.5 - 1.9 mmol/L    Comment: CRITICAL RESULT CALLED TO, READ BACK BY AND VERIFIED WITH Tyr Franca CHAMBER AT 1352 ON 09/12/18 Performed at [facility], [address] [place], [place] [address] CORRECTED ON 05/26 AT 1449: PREVIOUSLY REPORTED AS 2.0 CRITICAL RESULT CALLED TO, READ BACK BY AND VERIFIED WITH Heywood Tokunaga CHAMBER AT 1352 ON 09/08/18    Dg Chest Port 1 View  Result Date: 09/12/2018 CLINICAL DATA:  58 year old male with a history of increasing dizziness EXAM: PORTABLE CHEST 1 VIEW COMPARISON:  11/09/2006 FINDINGS: Cardiomediastinal silhouette unchanged in size and contour. No pneumothorax or pleural effusion. Low lung volumes with coarsened interstitial markings. Linear opacity at the left lung base with no confluent airspace disease. IMPRESSION: Low lung volumes without evidence of acute cardiopulmonary disease. Electronically Signed   By: Corrie Mckusick D.O.   On: 09/12/2018 10:19   Dg Knee Complete 4 Views Left  Result Date: 09/12/2018 CLINICAL DATA:  58 year old male with a history of knee replacement EXAM: LEFT KNEE - COMPLETE 4+ VIEW COMPARISON:  None. FINDINGS: No acute displaced fracture. Surgical changes of arthroplasty left knee. Joint effusion. Surgical staples project within the soft tissues of the midline. Soft tissue swelling at the surgical site. IMPRESSION: Early surgical changes of left knee  arthroplasty, with soft tissue swelling and joint effusion. Electronically Signed   By: Corrie Mckusick D.O.   On: 09/12/2018 10:15    Pending Labs Unresulted Labs (From admission, onward)    Start     Ordered   09/13/18 0500  Creatinine, serum  Tomorrow morning,   STAT     09/12/18 1423   09/12/18 1533  ANA Comprehensive Panel  Once,   STAT     09/12/18 1532   09/12/18 1533  Glomerular basement membrane antibodies  Once,   STAT     09/12/18 1532   09/12/18 1533  Protein Electro, Random Urine  Once,   STAT     09/12/18 1532   09/12/18 1533  Protein electrophoresis, serum  Once,   STAT     09/12/18 1532   09/12/18 1532  Hemoglobin A1c  Add-on,   AD    Comments:  To assess prior glycemic control    09/12/18 1531   09/12/18 1001  Blood Culture (routine x 2)  BLOOD CULTURE X 2,   STAT     09/12/18 1001   09/12/18 1001  Urine culture  ONCE - STAT,   STAT     09/12/18 1001   Signed and Held  HIV antibody (Routine Testing)  Once,   R     Signed and Held   Signed and Held  Basic metabolic panel  Tomorrow morning,   R     Signed and Held   Signed and Held  CBC  Tomorrow morning,   R     Signed and Held   Signed and Held  Lactic acid, plasma  STAT Now then every 3 hours,   R     Signed and Held          Vitals/Pain Today's Vitals   09/12/18 1300 09/12/18 1330 09/12/18 1430 09/12/18 1538  BP: 110/65 109/68 (!) 91/45 (!) 114/57  Pulse: (!) 110 (!) 37 (!) 114 (!) 113  Resp: (!) 30 (!) 33 (!) 31 (!) 32  Temp: (!) 102.7 F (39.3 C)  (!) 101.8 F (38.8 C)   TempSrc: Oral     SpO2: 94% 94% 96% 100%  Weight:      Height:      PainSc:        Isolation Precautions Droplet and Contact precautions  Medications Medications  vancomycin (VANCOCIN) IVPB 1000 mg/200 mL premix (has no administration in time range)  insulin aspart (novoLOG) injection 0-9 Units (has no administration in time range)  insulin aspart (novoLOG) injection 0-5 Units (has no administration in time range)   vancomycin (VANCOCIN) 2,000 mg in sodium chloride 0.9 % 500 mL IVPB (0 mg Intravenous Stopped 09/12/18 1423)  cefTRIAXone (ROCEPHIN) 2 g in sodium chloride 0.9 % 100 mL IVPB (0 g Intravenous Stopped 09/12/18 1150)  ciprofloxacin (CIPRO) IVPB 400 mg (0 mg Intravenous Stopped 09/12/18 1227)  sodium chloride 0.9 % bolus 1,000 mL (0 mLs Intravenous Stopped 09/12/18 1227)  sodium chloride 0.9 % bolus 1,000 mL (0 mLs Intravenous Stopped 09/12/18 1227)  sodium chloride 0.9 % bolus 1,000 mL (0 mLs Intravenous Stopped 09/12/18 1340)  sodium chloride 0.9 % bolus 1,500 mL (0 mLs Intravenous Stopped 09/12/18 1331)  acetaminophen (TYLENOL) suppository 975 mg (975 mg Rectal  Given 09/12/18 1338)    Mobility walks with device Low fall risk   Focused Assessments Neuro Assessment Handoff:   Cardiac Rhythm: Sinus tachycardia       Neuro Assessment: Exceptions to WDL(has been lightheaded for past few days worse today. present when still and with movement.) Neuro Checks:      Last Documented NIHSS Modified Score:    If patient is a Neuro Trauma and patient is going to OR before floor call report to [place] nurse: [phone] or [phone]  ,    R Recommendations: See Admitting Provider Note  Report given to:   Additional Notes:  Ortho has seen.  Pt forgetful but oriented. Soft pressures, ST. Minimal UOP 4500 of NS

## 2018-09-12 NOTE — ED Notes (Signed)
Waiting on admit bed to be ready. Pt updated. Remains with noted tachypnea

## 2018-09-12 NOTE — ED Notes (Signed)
Dr Joni Fears aware of bp. One liter fluid on pressure bag

## 2018-09-12 NOTE — ED Notes (Addendum)
Given sandwich ok per admitting MD. Pulled up and repositioned

## 2018-09-12 NOTE — ED Triage Notes (Addendum)
Here for increasing dizziness over past 2 days. Pt had bilateral knee replacement 5/15 and started with fevers over past 3 days ago and he was given abx but no labs/imaging done prior to prescription. Still having fevers. Took tylenol at 800 am today. 102 earlier per pt. Pt rolled out of bed and landed on left knee, increased pain.

## 2018-09-12 NOTE — ED Notes (Signed)
abx started without urine sample per dr Joni Fears.

## 2018-09-12 NOTE — Consult Note (Signed)
[facility]  CONSULT NOTE    Date: 09/12/2018                  Patient Name:  Isaac Cross  MRN: [id number]  DOB: 18-Jul-1960  Age / Sex: 58 y.o., male         PCP: Isaac Sizer, MD                 Service Requesting Consult: Dr. Tressia Miners                 Reason for Consult: Acute renal failure            History of Present Illness: [name] is a 58 y.o. black male with hyeprtension, COPD, diabetes mellitus type II who underwent bilateral knee replacement surgeries last week. who was admitted to [facility] on 09/12/2018 for dizziness ems  Found to have acute renal failure and shock. Patient states he feels dizzy. He was discharged on home on TMP/SMX. Patient with significant peripheral edema.   Medications: Outpatient medications: (Not in a hospital admission)   Current medications: Current Facility-Administered Medications  Medication Dose Route Frequency Provider Last Rate Last Dose  . ceFEPIme (MAXIPIME) 2 g in sodium chloride 0.9 % 100 mL IVPB  2 g Intravenous Q12H Rocky Morel, RPH      . insulin aspart (novoLOG) injection 0-5 Units  0-5 Units Subcutaneous QHS Gladstone Lighter, MD      . insulin aspart (novoLOG) injection 0-9 Units  0-9 Units Subcutaneous TID WC Gladstone Lighter, MD      . Derrill Memo ON 09/13/2018] vancomycin (VANCOCIN) IVPB 1000 mg/200 mL premix  1,000 mg Intravenous Q24H Carrie Mew, MD       Current Outpatient Medications  Medication Sig Dispense Refill  . acetaminophen (TYLENOL) 500 MG tablet Take 1 tablet (500 mg total) by mouth every 6 (six) hours as needed. 90 tablet 0  . albuterol (PROVENTIL HFA;VENTOLIN HFA) 108 (90 Base) MCG/ACT inhaler INHALE 2 PUFFS INTO THE LUNGS 4 (FOUR) TIMES DAILY. 6.7 Inhaler 0  . aspirin 81 MG tablet Take 81 mg by mouth daily.    Marland Kitchen atenolol (TENORMIN) 25 MG tablet Take 1 tablet (25 mg total) by mouth every evening. 90 tablet 1  . citalopram (CELEXA) 20 MG tablet Take 1 tablet (20  mg total) by mouth daily. 90 tablet 1  . fluticasone furoate-vilanterol (BREO ELLIPTA) 100-25 MCG/INH AEPB Inhale 1 puff into the lungs daily. 180 each 1  . lisinopril (PRINIVIL,ZESTRIL) 40 MG tablet Take 1 tablet (40 mg total) by mouth daily. 90 tablet 1  . metFORMIN (GLUCOPHAGE) 1000 MG tablet Take 1 tablet (1,000 mg total) by mouth 2 (two) times daily with a meal. 180 tablet 1  . Multiple Vitamins-Minerals (MENS MULTIVITAMIN PLUS) TABS Take by mouth.    . Omega-3 Fatty Acids (FISH OIL) 1000 MG CAPS Take by mouth daily.    . rosuvastatin (CRESTOR) 20 MG tablet TAKE 1 TABLET BY MOUTH EVERY DAY 90 tablet 1  . tamsulosin (FLOMAX) 0.4 MG CAPS capsule Take 1 capsule (0.4 mg total) by mouth daily. 30 capsule 0  . cephALEXin (KEFLEX) 500 MG capsule Take 500 mg by mouth 3 (three) times daily.    . cetirizine (ZYRTEC) 10 MG tablet Take 10 mg by mouth daily.    . diclofenac sodium (VOLTAREN) 1 % GEL APPLY 4 GRAMS TOPICALLY FOUR TIMES A DAY 100 g 2  . lidocaine (XYLOCAINE) 2 % solution Use as  directed 15 mLs in the mouth or throat as needed for mouth pain. 100 mL 0  . Oxycodone HCl 10 MG TABS     . oxyCODONE-acetaminophen (PERCOCET/ROXICET) 5-325 MG tablet Take 1 tablet by mouth every 4 (four) hours.    . SENNA-PLUS 8.6-50 MG tablet Take 2 tablets by mouth every morning.    . sulfamethoxazole-trimethoprim (BACTRIM DS) 800-160 MG tablet Take 1 tablet by mouth 2 (two) times a day.    . triamcinolone cream (KENALOG) 0.1 % triamcinolone acetonide 0.1 % topical cream    . XARELTO 10 MG TABS tablet         Allergies: Allergies  Allergen Reactions  . Lipitor [Atorvastatin] Hives and Itching  . Other   . Viagra  [Sildenafil Citrate]     vision loss      Past Medical History: Past Medical History:  Diagnosis Date  . Allergy   . Anxiety   . Arthritis    knees  . Asthma, mild intermittent, well-controlled   . Chronic obstructive asthma ([place])   . COPD (chronic obstructive pulmonary disease) ([place])    . Depression with anxiety   . Diabetes ([place])   . GERD (gastroesophageal reflux disease)   . HBP (high blood pressure)   . High cholesterol   . Renal cyst, right   . Sleep apnea    CPAP  . Swelling      Past Surgical History: Past Surgical History:  Procedure Laterality Date  . BLADDER REPAIR    . COLONOSCOPY WITH PROPOFOL N/A 04/07/2015   Procedure: COLONOSCOPY WITH PROPOFOL;  Surgeon: Lucilla Lame, MD;  Location: [place];  Service: Endoscopy;  Laterality: N/A;  Diabetic - oral meds CPAP  . HAND SURGERY     4TH AND 5TH FINGER  . KNEE ARTHROSCOPY Right   . 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 VEIN SURGERY Right      Family History: Family History  Problem Relation Age of Onset  . Hypertension Mother   . Diabetes Mother   . Diabetes Father   . Hypertension Father   . Cancer Father   . Seizures Daughter   . Lupus Daughter   . Cancer Maternal Grandfather        Prostate  . Diabetes Paternal Grandfather   . Prostate cancer Maternal Uncle   . Bladder Cancer Neg Hx   . Kidney cancer Neg Hx      Social History: Social History   Socioeconomic History  . Marital status: Married    Spouse name: Not on file  . Number of children: 2  . Years of education: Not on file  . Highest education level: Bachelor's degree (e.g., BA, AB, BS)  Occupational History  . Occupation: case Freight forwarder   Social Needs  . Financial resource strain: Not hard at all  . Food insecurity:    Worry: Never true    Inability: Never true  . Transportation needs:    Medical: No    Non-medical: No  Tobacco Use  . Smoking status: Former Smoker    Packs/day: 1.00    Years: 10.00    Pack years: 10.00    Types: Cigarettes    Start date: 04/20/1995    Last attempt to quit: 04/19/2005    Years since quitting: 13.4  . Smokeless tobacco: Never Used  Substance and Sexual Activity  . Alcohol use: Yes    Alcohol/week: 10.0 standard drinks    Types: 10 Cans of beer per  week  Comment: occasional  . Drug use: No  . Sexual activity: Yes    Partners: Female  Lifestyle  . Physical activity:    Days per week: 0 days    Minutes per session: 0 min  . Stress: Not at all  Relationships  . Social connections:    Talks on phone: More than three times a week    Gets together: Once a week    Attends religious service: Never    Active member of club or organization: No    Attends meetings of clubs or organizations: Never    Relationship status: Married  . Intimate partner violence:    Fear of current or ex partner: No    Emotionally abused: No    Physically abused: No    Forced sexual activity: No  Other Topics Concern  . Not on file  Social History Narrative   Lives at home with wife, using a walker since his surgery recently.     Review of Systems: Review of Systems  Constitutional: Positive for chills, diaphoresis, fever and malaise/fatigue. Negative for weight loss.  HENT: Negative.  Negative for congestion, ear discharge, ear pain, hearing loss, nosebleeds, sinus pain, sore throat and tinnitus.   Eyes: Negative.  Negative for blurred vision, double vision, photophobia, pain, discharge and redness.  Respiratory: Negative.  Negative for cough, hemoptysis, sputum production, shortness of breath, wheezing and stridor.   Cardiovascular: Positive for leg swelling. Negative for chest pain, palpitations, orthopnea, claudication and PND.  Gastrointestinal: Negative.  Negative for abdominal pain, blood in stool, constipation, diarrhea, heartburn, melena, nausea and vomiting.  Genitourinary: Negative.  Negative for dysuria, flank pain, frequency, hematuria and urgency.  Musculoskeletal: Negative.  Negative for back pain, falls, joint pain, myalgias and neck pain.  Skin: Negative.  Negative for itching and rash.  Neurological: Negative.  Negative for dizziness, tingling, tremors, sensory change, speech change, focal weakness, seizures, loss of consciousness,  weakness and headaches.  Endo/Heme/Allergies: Negative.  Negative for environmental allergies and polydipsia. Does not bruise/bleed easily.  Psychiatric/Behavioral: Negative.  Negative for depression, hallucinations, memory loss, substance abuse and suicidal ideas. The patient is not nervous/anxious and does not have insomnia.     Vital Signs: Blood pressure (!) 114/57, pulse (!) 113, temperature (!) 101.8 F (38.8 C), resp. rate (!) 32, height 6\' 4"  (1.93 m), weight (!) 145.2 kg, SpO2 100 %.  Weight trends: Filed Weights   09/12/18 0935  Weight: (!) 145.2 kg    Physical Exam: General: Ill appearing  Head: Normocephalic, atraumatic. Moist oral mucosal membranes  Eyes: Anicteric, PERRL  Neck: Supple, trachea midline  Lungs:  Clear to auscultation  Heart: Regular rate and rhythm  Abdomen:  Soft, nontender,   Extremities:  bilateral knee incisions staples, ++ edema  Neurologic: Nonfocal, moving all four extremities  Skin: No lesions         Lab results: Basic Metabolic Panel: Recent Labs  Lab 09/12/18 0955  NA 126*  K 4.4  CL 90*  CO2 17*  GLUCOSE 129*  BUN 28*  CREATININE 3.71*  CALCIUM 9.7    Liver Function Tests: Recent Labs  Lab 09/12/18 0955  AST 70*  ALT 42  ALKPHOS 86  BILITOT 0.7  PROT 7.6  ALBUMIN 3.0*   Recent Labs  Lab 09/12/18 0955  LIPASE 74*   No results for input(s): AMMONIA in the last 168 hours.  CBC: Recent Labs  Lab 09/12/18 0955  WBC 16.1*  NEUTROABS 10.3*  HGB 11.5*  HCT  35.7*  MCV 82.4  PLT 137*    Cardiac Enzymes: No results for input(s): CKTOTAL, CKMB, CKMBINDEX, TROPONINI in the last 168 hours.  BNP: Invalid input(s): POCBNP  CBG: No results for input(s): GLUCAP in the last 168 hours.  Microbiology: Results for orders placed or performed during the hospital encounter of 09/12/18  SARS Coronavirus 2 (CEPHEID- Performed in [place] hospital lab), Hosp Order     Status: None   Collection Time: 09/12/18 10:02  AM  Result Value Ref Range Status   SARS Coronavirus 2 NEGATIVE NEGATIVE Final    Comment: (NOTE) If result is NEGATIVE SARS-CoV-2 target nucleic acids are NOT DETECTED. The SARS-CoV-2 RNA is generally detectable in upper and lower  respiratory specimens during the acute phase of infection. The lowest  concentration of SARS-CoV-2 viral copies this assay can detect is 250  copies / mL. A negative result does not preclude SARS-CoV-2 infection  and should not be used as the sole basis for treatment or other  patient management decisions.  A negative result may occur with  improper specimen collection / handling, submission of specimen other  than nasopharyngeal swab, presence of viral mutation(s) within the  areas targeted by this assay, and inadequate number of viral copies  (<250 copies / mL). A negative result must be combined with clinical  observations, patient history, and epidemiological information. If result is POSITIVE SARS-CoV-2 target nucleic acids are DETECTED. The SARS-CoV-2 RNA is generally detectable in upper and lower  respiratory specimens dur ing the acute phase of infection.  Positive  results are indicative of active infection with SARS-CoV-2.  Clinical  correlation with patient history and other diagnostic information is  necessary to determine patient infection status.  Positive results do  not rule out bacterial infection or co-infection with other viruses. If result is PRESUMPTIVE POSTIVE SARS-CoV-2 nucleic acids MAY BE PRESENT.   A presumptive positive result was obtained on the submitted specimen  and confirmed on repeat testing.  While 2019 novel coronavirus  (SARS-CoV-2) nucleic acids may be present in the submitted sample  additional confirmatory testing may be necessary for epidemiological  and / or clinical management purposes  to differentiate between  SARS-CoV-2 and other Sarbecovirus currently known to infect humans.  If clinically indicated  additional testing with an alternate test  methodology [id number]) is advised. The SARS-CoV-2 RNA is generally  detectable in upper and lower respiratory sp ecimens during the acute  phase of infection. The expected result is Negative. Fact Sheet for Patients:  StrictlyIdeas.no Fact Sheet for Healthcare Providers: BankingDealers.co.za This test is not yet approved or cleared by the [place] FDA and has been authorized for detection and/or diagnosis of SARS-CoV-2 by FDA under an Emergency Use Authorization (EUA).  This EUA will remain in effect (meaning this test can be used) for the duration of the COVID-19 declaration under Section 564(b)(1) of the Act, 21 U.S.C. section 360bbb-3(b)(1), unless the authorization is terminated or revoked sooner. Performed at [facility], [address] [place], [place] [address]     Coagulation Studies: Recent Labs    09/12/18 0955  LABPROT 15.3*  INR 1.2    Urinalysis: Recent Labs    09/12/18 1001  COLORURINE AMBER*  LABSPEC 1.026  PHURINE 5.0  GLUCOSEU NEGATIVE  HGBUR NEGATIVE  BILIRUBINUR NEGATIVE  KETONESUR 5*  PROTEINUR 100*  NITRITE NEGATIVE  LEUKOCYTESUR NEGATIVE      Imaging: Dg Chest Port 1 View  Result Date: 09/12/2018 CLINICAL DATA:  58 year old male  with a history of increasing dizziness EXAM: PORTABLE CHEST 1 VIEW COMPARISON:  11/09/2006 FINDINGS: Cardiomediastinal silhouette unchanged in size and contour. No pneumothorax or pleural effusion. Low lung volumes with coarsened interstitial markings. Linear opacity at the left lung base with no confluent airspace disease. IMPRESSION: Low lung volumes without evidence of acute cardiopulmonary disease. Electronically Signed   By: Corrie Mckusick D.O.   On: 09/12/2018 10:19   Dg Knee Complete 4 Views Left  Result Date: 09/12/2018 CLINICAL DATA:  58 year old male with a history of knee replacement EXAM: LEFT KNEE -  COMPLETE 4+ VIEW COMPARISON:  None. FINDINGS: No acute displaced fracture. Surgical changes of arthroplasty left knee. Joint effusion. Surgical staples project within the soft tissues of the midline. Soft tissue swelling at the surgical site. IMPRESSION: Early surgical changes of left knee arthroplasty, with soft tissue swelling and joint effusion. Electronically Signed   By: Corrie Mckusick D.O.   On: 09/12/2018 10:15      Assessment & Plan: [name] is a 58 y.o. black male with hyeprtension, COPD, diabetes mellitus type II who underwent bilateral knee replacement surgeries last week. who was admitted to [facility] on 09/12/2018 for dizziness ems  1. Acute renal failure: baseline creatinine of 1.07 with normal GFR in 06/12/18.  2. Septic shock/hypotension - unclear source 3. Diabetes mellitus type II 4. Bilateral knee surgery  5. Hyponatremia 6. Metabolic acidosis  Plan Holding metformin, lisinopril and TMP/SMX TMP/SMX could be contributing to renal failure - IV fluids: sodium bicarbonate infusion - Low threshold for renal replacement therapy     LOS: 0 Safi Culotta 5/26/20204:46 PM

## 2018-09-12 NOTE — Progress Notes (Signed)
Pharmacy Antibiotic Note  Isaac Cross is a 57 y.o. male admitted on 09/12/2018 with sepsis.  Pharmacy has been consulted for cefepime dosing. Pt H&P likely urinary source but with recent knee replacement surgery.   Plan: Cefepime 2 g IV q12h based on current CrCl. Will need to closely monitor renal function. Pt also ordered vancomycin.   Height: 6\' 4"  (193 cm) Weight: (!) 320 lb (145.2 kg) IBW/kg (Calculated) : 86.8  Temp (24hrs), Avg:101 F (38.3 C), Min:99.6 F (37.6 C), Max:102.7 F (39.3 C)  Recent Labs  Lab 09/12/18 0955 09/12/18 1301  WBC 16.1*  --   CREATININE 3.71*  --   LATICACIDVEN 4.9* 2.0*    Estimated Creatinine Clearance: 33 mL/min (A) (by C-G formula based on SCr of 3.71 mg/dL (H)).    Allergies  Allergen Reactions  . Lipitor [Atorvastatin] Hives and Itching  . Other   . Viagra  [Sildenafil Citrate]     vision loss    Antimicrobials this admission: CTX x1 5/26 cipro x1 5/26 vanc 5/26 >> Cefepime 5/26 >>  Dose adjustments this admission:   Microbiology results: 5/26 BCx: sent 5/26 UCx: sent    Thank you for allowing pharmacy to be a part of this patient's care.  Rocky Morel 09/12/2018 3:56 PM

## 2018-09-12 NOTE — Consult Note (Addendum)
ORTHOPAEDIC CONSULTATION  REQUESTING PHYSICIAN: Gladstone Lighter, MD  Chief Complaint: fever  HPI: Isaac Cross is a 58 y.o. male who complains of elevated temperature for the past 5 days.Patient underwent bilateral total knee replacement on 09/01/2018. Please see H&P and ED notes for details. Denies any numbness or tingling.  Past Medical History:  Diagnosis Date  . Allergy   . Anxiety   . Arthritis    knees  . Asthma, mild intermittent, well-controlled   . Chronic obstructive asthma ([place])   . COPD (chronic obstructive pulmonary disease) ([place])   . Depression with anxiety   . Diabetes ([place])   . GERD (gastroesophageal reflux disease)   . HBP (high blood pressure)   . High cholesterol   . Renal cyst, right   . Sleep apnea    CPAP  . Swelling    Past Surgical History:  Procedure Laterality Date  . BLADDER REPAIR    . COLONOSCOPY WITH PROPOFOL N/A 04/07/2015   Procedure: COLONOSCOPY WITH PROPOFOL;  Surgeon: Lucilla Lame, MD;  Location: [place];  Service: Endoscopy;  Laterality: N/A;  Diabetic - oral meds CPAP  . HAND SURGERY     4TH AND 5TH FINGER  . KNEE ARTHROSCOPY Right   . 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 VEIN SURGERY Right    Social History   Socioeconomic History  . Marital status: Married    Spouse name: Not on file  . Number of children: 2  . Years of education: Not on file  . Highest education level: Bachelor's degree (e.g., BA, AB, BS)  Occupational History  . Occupation: case Freight forwarder   Social Needs  . Financial resource strain: Not hard at all  . Food insecurity:    Worry: Never true    Inability: Never true  . Transportation needs:    Medical: No    Non-medical: No  Tobacco Use  . Smoking status: Former Smoker    Packs/day: 1.00    Years: 10.00    Pack years: 10.00    Types: Cigarettes    Start date: 04/20/1995    Last attempt to quit: 04/19/2005    Years since quitting: 13.4  . Smokeless tobacco: Never  Used  Substance and Sexual Activity  . Alcohol use: Yes    Alcohol/week: 10.0 standard drinks    Types: 10 Cans of beer per week    Comment: occasional  . Drug use: No  . Sexual activity: Yes    Partners: Female  Lifestyle  . Physical activity:    Days per week: 0 days    Minutes per session: 0 min  . Stress: Not at all  Relationships  . Social connections:    Talks on phone: More than three times a week    Gets together: Once a week    Attends religious service: Never    Active member of club or organization: No    Attends meetings of clubs or organizations: Never    Relationship status: Married  Other Topics Concern  . Not on file  Social History Narrative   Lives at home with wife, using a walker since his surgery recently.   Family History  Problem Relation Age of Onset  . Hypertension Mother   . Diabetes Mother   . Diabetes Father   . Hypertension Father   . Cancer Father   . Seizures Daughter   . Lupus Daughter   . Cancer Maternal Grandfather  Prostate  . Diabetes Paternal Grandfather   . Prostate cancer Maternal Uncle   . Bladder Cancer Neg Hx   . Kidney cancer Neg Hx    Allergies  Allergen Reactions  . Lipitor [Atorvastatin] Hives and Itching  . Other   . Viagra  [Sildenafil Citrate]     vision loss   Prior to Admission medications   Medication Sig Start Date End Date Taking? Authorizing Provider  acetaminophen (TYLENOL) 500 MG tablet Take 1 tablet (500 mg total) by mouth every 6 (six) hours as needed. 12/08/16   Steele Sizer, MD  albuterol (PROVENTIL HFA;VENTOLIN HFA) 108 (90 Base) MCG/ACT inhaler INHALE 2 PUFFS INTO THE LUNGS 4 (FOUR) TIMES DAILY. 04/02/18   Steele Sizer, MD  aspirin 81 MG tablet Take 81 mg by mouth daily.    [provider]  atenolol (TENORMIN) 25 MG tablet Take 1 tablet (25 mg total) by mouth every evening. 06/07/18   Steele Sizer, MD  cetirizine (ZYRTEC) 10 MG tablet Take 10 mg by mouth daily.    [provider]  citalopram (CELEXA) 20 MG tablet Take 1 tablet (20 mg total) by mouth daily. 06/07/18   Steele Sizer, MD  diclofenac sodium (VOLTAREN) 1 % GEL APPLY 4 GRAMS TOPICALLY FOUR TIMES A DAY 07/13/17   Sowles, Drue Stager, MD  fluticasone furoate-vilanterol (BREO ELLIPTA) 100-25 MCG/INH AEPB Inhale 1 puff into the lungs daily. 12/02/17   Steele Sizer, MD  lidocaine (XYLOCAINE) 2 % solution Use as directed 15 mLs in the mouth or throat as needed for mouth pain. 03/15/18   Steele Sizer, MD  lisinopril (PRINIVIL,ZESTRIL) 40 MG tablet Take 1 tablet (40 mg total) by mouth daily. 06/07/18   Steele Sizer, MD  metFORMIN (GLUCOPHAGE) 1000 MG tablet Take 1 tablet (1,000 mg total) by mouth 2 (two) times daily with a meal. 06/07/18   Ancil Boozer, Drue Stager, MD  Multiple Vitamins-Minerals (MENS MULTIVITAMIN PLUS) TABS Take by mouth.    [provider]  Omega-3 Fatty Acids (FISH OIL) 1000 MG CAPS Take by mouth daily.    [provider]  Oxycodone HCl 10 MG TABS  09/03/18   [provider]  rosuvastatin (CRESTOR) 20 MG tablet TAKE 1 TABLET BY MOUTH EVERY DAY 05/10/18   Steele Sizer, MD  tamsulosin (FLOMAX) 0.4 MG CAPS capsule Take 1 capsule (0.4 mg total) by mouth daily. 09/05/18   Steele Sizer, MD  triamcinolone cream (KENALOG) 0.1 % triamcinolone acetonide 0.1 % topical cream    [provider]  XARELTO 10 MG TABS tablet  09/02/18   [provider]   Dg Chest Port 1 View  Result Date: 09/12/2018 CLINICAL DATA:  58 year old male with a history of increasing dizziness EXAM: PORTABLE CHEST 1 VIEW COMPARISON:  11/09/2006 FINDINGS: Cardiomediastinal silhouette unchanged in size and contour. No pneumothorax or pleural effusion. Low lung volumes with coarsened interstitial markings. Linear opacity at the left lung base with no confluent airspace disease. IMPRESSION: Low lung volumes without evidence of acute cardiopulmonary disease. Electronically Signed   By: Corrie Mckusick D.O.   On: 09/12/2018 10:19   Dg Knee Complete 4 Views Left  Result Date: 09/12/2018 CLINICAL DATA:  58 year old male with a history of knee replacement EXAM: LEFT KNEE - COMPLETE 4+ VIEW COMPARISON:  None. FINDINGS: No acute displaced fracture. Surgical changes of arthroplasty left knee. Joint effusion. Surgical staples project within the soft tissues of the midline. Soft tissue swelling at the surgical site. IMPRESSION: Early surgical changes of left knee arthroplasty,  with soft tissue swelling and joint effusion. Electronically Signed   By: Corrie Mckusick D.O.   On: 09/12/2018 10:15    Positive ROS: All other systems have been reviewed and were otherwise negative with the exception of those mentioned in the HPI and as above.  Physical Exam: General: Alert, mild distress Cardiovascular: No pedal edema Respiratory: No cyanosis, no use of accessory musculature GI: No organomegaly, abdomen is soft and non-tender Skin: No lesions in the area of chief complaint Neurologic: Sensation intact distally Psychiatric: Patient is competent for consent with normal mood and affect Lymphatic: No axillary or cervical lymphadenopathy  MUSCULOSKELETAL: incisions are c/d/i, moderate swelling, right worse than left. Mild ecchymosis bilateral lower extremities. Negative Homan sign. Compartments soft. Good cap refill. Motor and sensory intact distally.  Assessment: Fever and dehydration after bilateral total knee  Plan: Will follow closely for any evidence of septic arthritis. I do not recommend aspiration at this time. Appreciate medical treatment.  OK to start physical therapy when medically stable, weight bearing as tolerated.  Please call with questions.    Lovell Sheehan, MD    09/12/2018 4:13 PM

## 2018-09-12 NOTE — H&P (Signed)
[facility] NAME: Isaac Cross    MR#:  [id number]  DATE OF BIRTH:  [birth year]  DATE OF ADMISSION:  09/12/2018  PRIMARY CARE PHYSICIAN: Steele Sizer, MD   REQUESTING/REFERRING PHYSICIAN: Dr. Carrie Mew  CHIEF COMPLAINT:   Chief Complaint  Patient presents with  . Dizziness    HISTORY OF PRESENT ILLNESS:  Isaac Cross  is a 59 y.o. male with a known history of arthritis, COPD not on home oxygen, hypertension, sleep apnea, non-insulin-dependent diabetes mellitus presents to hospital secondary to dizziness, weakness and a fall this morning. Patient had bilateral knee replacement surgeries done about 9 days ago.  He did well for the first week.  Over the last 3 to 4 days he has been having fevers and chills.  Initially had increased frequency of urination that has decreased the last 3 days.  His urine output has been decreased.  He has been feeling dizzy and lightheaded.  Has been nauseous and had one episode of vomiting.  Denies any abdominal pain or diarrhea.  Denies any exposure to sick contacts.  His COVID-19 test is negative here.  Denies any cough or respiratory symptoms or chest pain.  He states that he fell out of bed onto his knees this morning and hurt both his knees.  After the surgery he has been getting home health physical therapy and was able to walk using his walker. In the ED his creatinine is increased from 1 at baseline to 3.7 today.  He received 4.5 L of fluids in the emergency room and his urine output after a Foley catheter was placed here was only 75 cc.  He is hyponatremic, acidotic.  Urine analysis showing possible infection.  Has lactic acidosis with elevated WBC and procalcitonin.  He is being admitted for sepsis.  PAST MEDICAL HISTORY:   Past Medical History:  Diagnosis Date  . Allergy   . Anxiety   . Arthritis    knees  . Asthma, mild intermittent, well-controlled   . Chronic obstructive asthma  ([place])   . COPD (chronic obstructive pulmonary disease) ([place])   . Depression with anxiety   . Diabetes ([place])   . GERD (gastroesophageal reflux disease)   . HBP (high blood pressure)   . High cholesterol   . Renal cyst, right   . Sleep apnea    CPAP  . Swelling     PAST SURGICAL HISTORY:   Past Surgical History:  Procedure Laterality Date  . BLADDER REPAIR    . COLONOSCOPY WITH PROPOFOL N/A 04/07/2015   Procedure: COLONOSCOPY WITH PROPOFOL;  Surgeon: Lucilla Lame, MD;  Location: [facility];  Service: Endoscopy;  Laterality: N/A;  Diabetic - oral meds CPAP  . HAND SURGERY     4TH AND 5TH FINGER  . KNEE ARTHROSCOPY Right   . 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 VEIN SURGERY Right     SOCIAL HISTORY:   Social History   Tobacco Use  . Smoking status: Former Smoker    Packs/day: 1.00    Years: 10.00    Pack years: 10.00    Types: Cigarettes    Start date: 04/20/1995    Last attempt to quit: 04/19/2005    Years since quitting: 13.4  . Smokeless tobacco: Never Used  Substance Use Topics  . Alcohol use: Yes    Alcohol/week: 10.0 standard drinks    Types: 10 Cans of beer per week    Comment:  occasional    FAMILY HISTORY:   Family History  Problem Relation Age of Onset  . Hypertension Mother   . Diabetes Mother   . Diabetes Father   . Hypertension Father   . Cancer Father   . Seizures Daughter   . Lupus Daughter   . Cancer Maternal Grandfather        Prostate  . Diabetes Paternal Grandfather   . Prostate cancer Maternal Uncle   . Bladder Cancer Neg Hx   . Kidney cancer Neg Hx     DRUG ALLERGIES:   Allergies  Allergen Reactions  . Lipitor [Atorvastatin] Hives and Itching  . Other   . Viagra  [Sildenafil Citrate]     vision loss    REVIEW OF SYSTEMS:   Review of Systems  Constitutional: Positive for chills, fever and malaise/fatigue. Negative for weight loss.  HENT: Negative for ear discharge, ear pain, hearing loss,  nosebleeds and tinnitus.   Eyes: Negative for blurred vision, double vision and photophobia.  Respiratory: Negative for cough, hemoptysis, shortness of breath and wheezing.   Cardiovascular: Negative for chest pain, palpitations, orthopnea and leg swelling.  Gastrointestinal: Positive for nausea and vomiting. Negative for abdominal pain, constipation, diarrhea, heartburn and melena.  Genitourinary: Negative for dysuria, frequency, hematuria and urgency.  Musculoskeletal: Positive for joint pain and myalgias. Negative for back pain and neck pain.  Skin: Negative for rash.  Neurological: Negative for dizziness, tingling, tremors, sensory change, speech change, focal weakness and headaches.  Endo/Heme/Allergies: Does not bruise/bleed easily.  Psychiatric/Behavioral: Negative for depression.    MEDICATIONS AT HOME:   Prior to Admission medications   Medication Sig Start Date End Date Taking? Authorizing Provider  acetaminophen (TYLENOL) 500 MG tablet Take 1 tablet (500 mg total) by mouth every 6 (six) hours as needed. 12/08/16   Steele Sizer, MD  albuterol (PROVENTIL HFA;VENTOLIN HFA) 108 (90 Base) MCG/ACT inhaler INHALE 2 PUFFS INTO THE LUNGS 4 (FOUR) TIMES DAILY. 04/02/18   Steele Sizer, MD  aspirin 81 MG tablet Take 81 mg by mouth daily.    [provider]  atenolol (TENORMIN) 25 MG tablet Take 1 tablet (25 mg total) by mouth every evening. 06/07/18   Steele Sizer, MD  cetirizine (ZYRTEC) 10 MG tablet Take 10 mg by mouth daily.    [provider]  citalopram (CELEXA) 20 MG tablet Take 1 tablet (20 mg total) by mouth daily. 06/07/18   Steele Sizer, MD  diclofenac sodium (VOLTAREN) 1 % GEL APPLY 4 GRAMS TOPICALLY FOUR TIMES A DAY 07/13/17   Sowles, Drue Stager, MD  fluticasone furoate-vilanterol (BREO ELLIPTA) 100-25 MCG/INH AEPB Inhale 1 puff into the lungs daily. 12/02/17   Steele Sizer, MD  lidocaine (XYLOCAINE) 2 % solution Use as directed 15 mLs in the mouth or  throat as needed for mouth pain. 03/15/18   Steele Sizer, MD  lisinopril (PRINIVIL,ZESTRIL) 40 MG tablet Take 1 tablet (40 mg total) by mouth daily. 06/07/18   Steele Sizer, MD  metFORMIN (GLUCOPHAGE) 1000 MG tablet Take 1 tablet (1,000 mg total) by mouth 2 (two) times daily with a meal. 06/07/18   Ancil Boozer, Drue Stager, MD  Multiple Vitamins-Minerals (MENS MULTIVITAMIN PLUS) TABS Take by mouth.    [provider]  Omega-3 Fatty Acids (FISH OIL) 1000 MG CAPS Take by mouth daily.    [provider]  Oxycodone HCl 10 MG TABS  09/03/18   [provider]  rosuvastatin (CRESTOR) 20 MG tablet TAKE 1 TABLET BY MOUTH EVERY DAY 05/10/18  Steele Sizer, MD  tamsulosin (FLOMAX) 0.4 MG CAPS capsule Take 1 capsule (0.4 mg total) by mouth daily. 09/05/18   Steele Sizer, MD  triamcinolone cream (KENALOG) 0.1 % triamcinolone acetonide 0.1 % topical cream    [provider]  XARELTO 10 MG TABS tablet  09/02/18   [provider]      VITAL SIGNS:  Blood pressure (!) 91/45, pulse (!) 114, temperature (!) 101.8 F (38.8 C), resp. rate (!) 31, height 6\' 4"  (1.93 m), weight (!) 145.2 kg, SpO2 96 %.  PHYSICAL EXAMINATION:  Physical Exam  GENERAL:  58 y.o.-year-old obese patient lying in the bed with no acute distress.  EYES: Pupils equal, round, reactive to light and accommodation. No scleral icterus. Extraocular muscles intact.  HEENT: Head atraumatic, normocephalic. Oropharynx and nasopharynx clear.  NECK:  Supple, no jugular venous distention. No thyroid enlargement, no tenderness.  LUNGS: Normal breath sounds bilaterally, no wheezing, rales,rhonchi or crepitation. No use of accessory muscles of respiration.  Decreased bibasilar breath sounds CARDIOVASCULAR: S1, S2 normal. No murmurs, rubs, or gallops.  ABDOMEN: Soft, nontender, nondistended. Bowel sounds present. No organomegaly or mass.  EXTREMITIES: Bilateral knee replacement surgeries done with staples in  place.  Swelling around the knees and 1+ edema of both legs noted.  No  cyanosis, or clubbing.  NEUROLOGIC: Cranial nerves II through XII are intact. Muscle strength 5/5 in all extremities. Sensation intact. Gait not checked.  Global weakness noted PSYCHIATRIC: The patient is alert and oriented x 3.  SKIN: No obvious rash, lesion, or ulcer.   LABORATORY PANEL:   CBC Recent Labs  Lab 09/12/18 0955  WBC 16.1*  HGB 11.5*  HCT 35.7*  PLT 137*   ------------------------------------------------------------------------------------------------------------------  Chemistries  Recent Labs  Lab 09/12/18 0955  NA 126*  K 4.4  CL 90*  CO2 17*  GLUCOSE 129*  BUN 28*  CREATININE 3.71*  CALCIUM 9.7  AST 70*  ALT 42  ALKPHOS 86  BILITOT 0.7   ------------------------------------------------------------------------------------------------------------------  Cardiac Enzymes No results for input(s): TROPONINI in the last 168 hours. ------------------------------------------------------------------------------------------------------------------  RADIOLOGY:  Dg Chest Port 1 View  Result Date: 09/12/2018 CLINICAL DATA:  58 year old male with a history of increasing dizziness EXAM: PORTABLE CHEST 1 VIEW COMPARISON:  11/09/2006 FINDINGS: Cardiomediastinal silhouette unchanged in size and contour. No pneumothorax or pleural effusion. Low lung volumes with coarsened interstitial markings. Linear opacity at the left lung base with no confluent airspace disease. IMPRESSION: Low lung volumes without evidence of acute cardiopulmonary disease. Electronically Signed   By: Corrie Mckusick D.O.   On: 09/12/2018 10:19   Dg Knee Complete 4 Views Left  Result Date: 09/12/2018 CLINICAL DATA:  58 year old male with a history of knee replacement EXAM: LEFT KNEE - COMPLETE 4+ VIEW COMPARISON:  None. FINDINGS: No acute displaced fracture. Surgical changes of arthroplasty left knee. Joint effusion. Surgical  staples project within the soft tissues of the midline. Soft tissue swelling at the surgical site. IMPRESSION: Early surgical changes of left knee arthroplasty, with soft tissue swelling and joint effusion. Electronically Signed   By: Corrie Mckusick D.O.   On: 09/12/2018 10:15      IMPRESSION AND PLAN:   Isaac Cross  is a 58 y.o. male with a known history of arthritis, COPD not on home oxygen, hypertension, sleep apnea, non-insulin-dependent diabetes mellitus presents to hospital secondary to dizziness, weakness and a fall this morning.  1.  Sepsis-likely source urine at this point.  Bilateral knee staples in place with  no signs of infection.  Ortho has been consulted however. -Blood cultures urine cultures ordered. -We will start on vancomycin and cefepime -Procalcitonin is elevated.  Continue to recycle lactic acid -IV fluids.  2.  Acute renal failure-likely ATN from sepsis.  Minimal urine output.  Foley catheter placed in the ED today.  Only 75 cc urine out. -Renal ultrasound ordered.  Nephrology has been consulted.  Serological work-up has been ordered as well. -Hold lisinopril and metformin and other nephrotoxins.  3.  Bilateral knee replacement surgery-bilateral knee surgery done about 10 days ago.  X-ray showing soft tissue swelling, patient had a fall earlier today onto his knees. -Orthopedics has been consulted.  Other than minimal swelling, no concern for infection at this point.  4.  Hypertension-hold lisinopril.  5.  Diabetes mellitus-we will start sliding scale insulin.  Hold metformin given worsening renal failure.  6.  DVT prophylaxis-we will hold Xarelto and will start subcutaneous heparin while in the hospital  Updated wife over the phone.   All the records are reviewed and case discussed with ED provider. Management plans discussed with the patient, family and they are in agreement.  CODE STATUS: Full Code  TOTAL TIME TAKING CARE OF THIS PATIENT: 51 minutes.     Gladstone Lighter M.D on 09/12/2018 at 3:23 PM  Between 7am to 6pm - Pager - [phone]  After 6pm go to [email or web address] - [employer] [facility]  Office  [phone]  CC: Primary care physician; Steele Sizer, MD   Note: This dictation was prepared with Dragon dictation along with smaller phrase technology. Any transcriptional errors that result from this process are unintentional.

## 2018-09-13 ENCOUNTER — Inpatient Hospital Stay: Payer: BC Managed Care – PPO

## 2018-09-13 DIAGNOSIS — Z888 Allergy status to other drugs, medicaments and biological substances status: Secondary | ICD-10-CM

## 2018-09-13 DIAGNOSIS — J449 Chronic obstructive pulmonary disease, unspecified: Secondary | ICD-10-CM

## 2018-09-13 DIAGNOSIS — Z7984 Long term (current) use of oral hypoglycemic drugs: Secondary | ICD-10-CM

## 2018-09-13 DIAGNOSIS — E119 Type 2 diabetes mellitus without complications: Secondary | ICD-10-CM

## 2018-09-13 DIAGNOSIS — I10 Essential (primary) hypertension: Secondary | ICD-10-CM

## 2018-09-13 DIAGNOSIS — Z79899 Other long term (current) drug therapy: Secondary | ICD-10-CM

## 2018-09-13 DIAGNOSIS — K219 Gastro-esophageal reflux disease without esophagitis: Secondary | ICD-10-CM

## 2018-09-13 DIAGNOSIS — R509 Fever, unspecified: Secondary | ICD-10-CM

## 2018-09-13 DIAGNOSIS — Z96653 Presence of artificial knee joint, bilateral: Secondary | ICD-10-CM

## 2018-09-13 LAB — BASIC METABOLIC PANEL
Anion gap: 12 (ref 5–15)
BUN: 34 mg/dL — ABNORMAL HIGH (ref 6–20)
CO2: 18 mmol/L — ABNORMAL LOW (ref 22–32)
Calcium: 7.9 mg/dL — ABNORMAL LOW (ref 8.9–10.3)
Chloride: 96 mmol/L — ABNORMAL LOW (ref 98–111)
Creatinine, Ser: 4.31 mg/dL — ABNORMAL HIGH (ref 0.61–1.24)
GFR calc Af Amer: 16 mL/min — ABNORMAL LOW (ref >60)
GFR calc non Af Amer: 14 mL/min — ABNORMAL LOW (ref >60)
Glucose, Bld: 196 mg/dL — ABNORMAL HIGH (ref 70–99)
Potassium: 4.1 mmol/L (ref 3.5–5.1)
Sodium: 126 mmol/L — ABNORMAL LOW (ref 135–145)

## 2018-09-13 LAB — CBC
HCT: 26.7 % — ABNORMAL LOW (ref 39.0–52.0)
Hemoglobin: 8.7 g/dL — ABNORMAL LOW (ref 13.0–17.0)
MCH: 26.7 pg (ref 26.0–34.0)
MCHC: 32.6 g/dL (ref 30.0–36.0)
MCV: 81.9 fL (ref 80.0–100.0)
Platelets: 137 10*3/uL — ABNORMAL LOW (ref 150–400)
RBC: 3.26 MIL/uL — ABNORMAL LOW (ref 4.22–5.81)
RDW: 14.6 % (ref 11.5–15.5)
WBC: 9.4 10*3/uL (ref 4.0–10.5)
nRBC: 0.2 % (ref 0.0–0.2)

## 2018-09-13 LAB — GLUCOSE, CAPILLARY
Glucose-Capillary: 212 mg/dL — ABNORMAL HIGH (ref 70–99)
Glucose-Capillary: 199 mg/dL — ABNORMAL HIGH (ref 70–99)
Glucose-Capillary: 191 mg/dL — ABNORMAL HIGH (ref 70–99)
Glucose-Capillary: 246 mg/dL — ABNORMAL HIGH (ref 70–99)
Glucose-Capillary: 237 mg/dL — ABNORMAL HIGH (ref 70–99)

## 2018-09-13 LAB — CK: Total CK: 4516 U/L — ABNORMAL HIGH (ref 49–397)

## 2018-09-13 LAB — URINE CULTURE: Culture: NO GROWTH

## 2018-09-13 LAB — PROCALCITONIN: Procalcitonin: 10.36 ng/mL

## 2018-09-13 IMAGING — US US RENAL
1 series · 14 of 25 positions shown · non-contrast
Comparison: CT dated [DATE]

CLINICAL DATA: Acute renal failure

EXAM:
RENAL / URINARY TRACT ULTRASOUND COMPLETE

[Series 1: us renal · 14 of 56 slices shown]
[im 1/56]
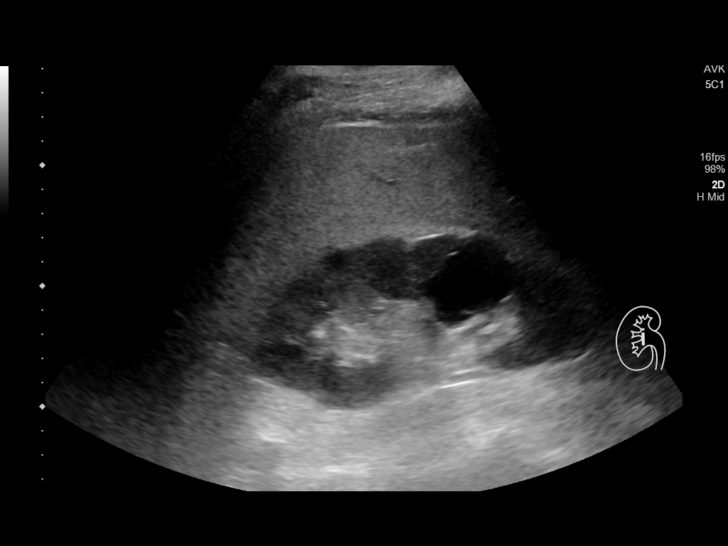
[im 5/56]
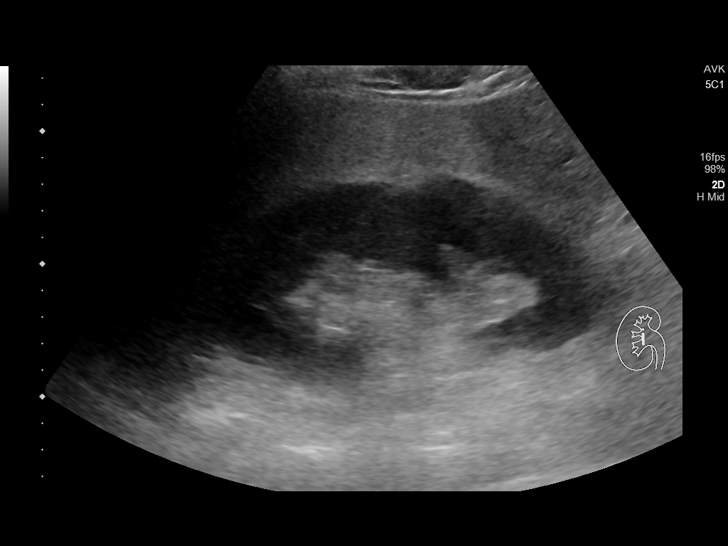
[im 10/56]
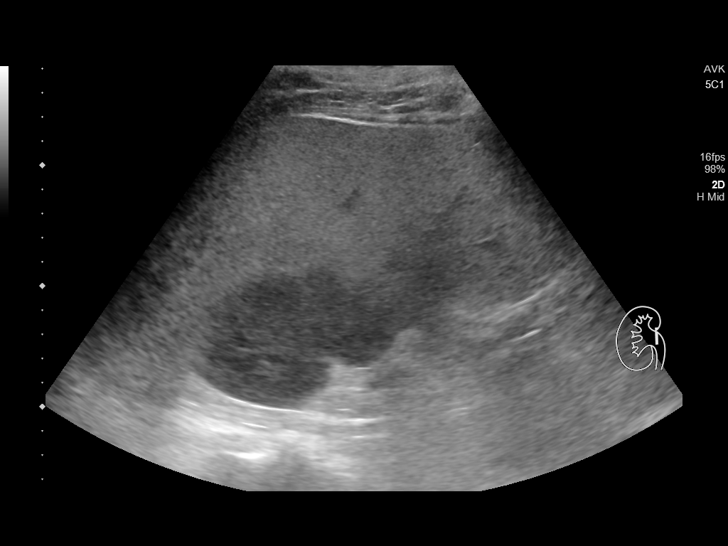
[im 14/56]
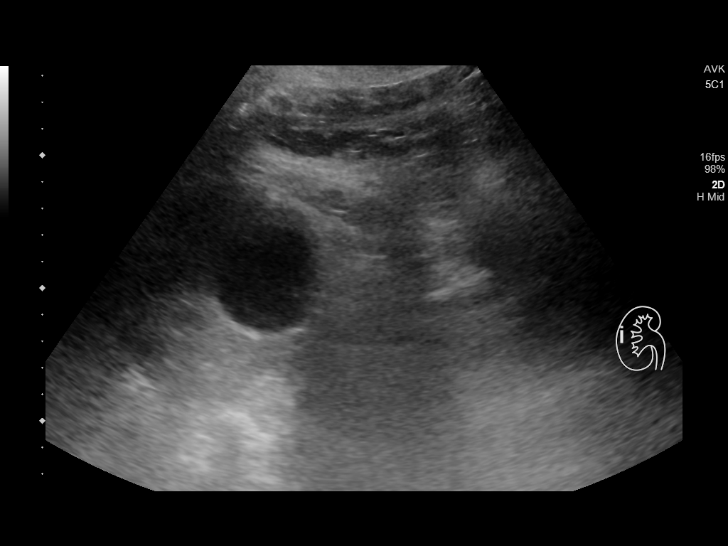
[im 19/56]
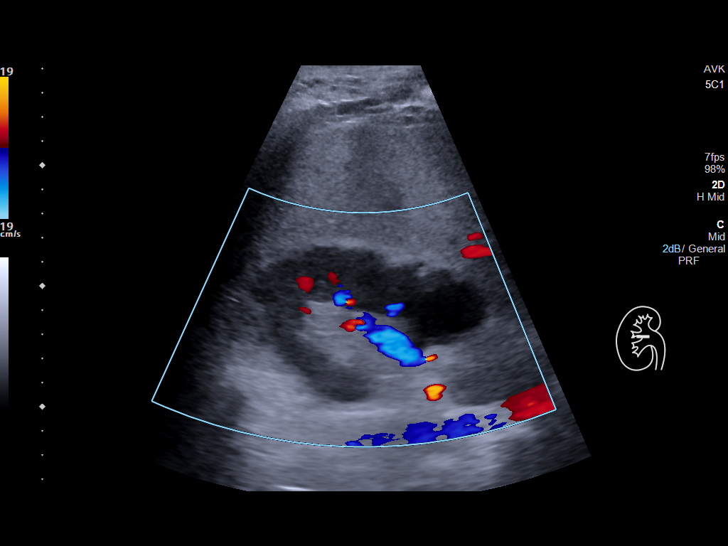
[im 21/56]
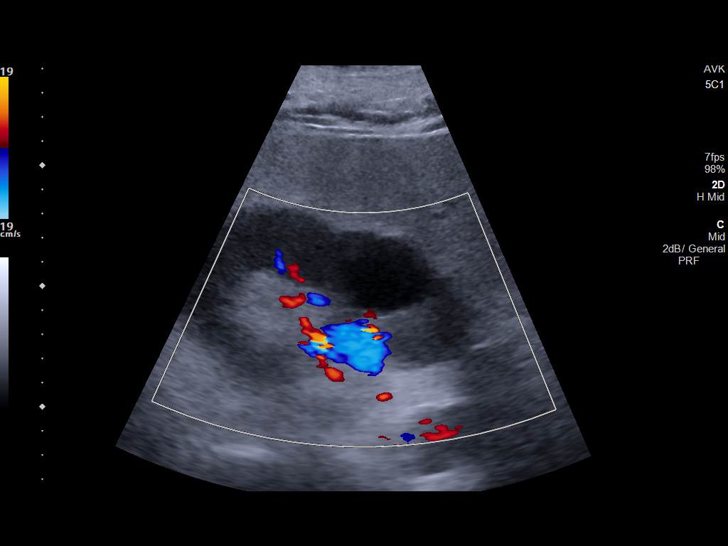
[im 26/56]
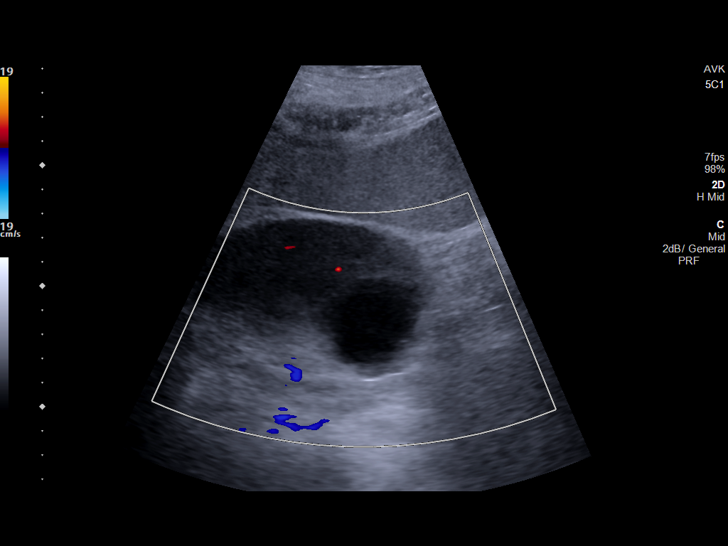
[im 30/56]
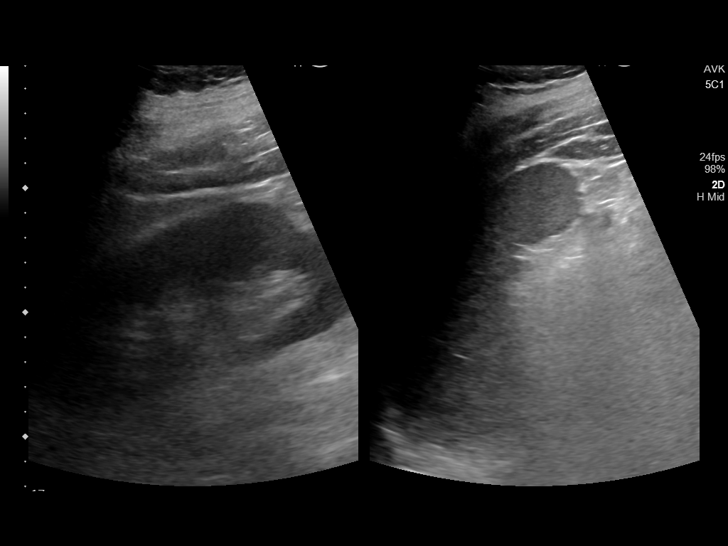
[im 35/56]
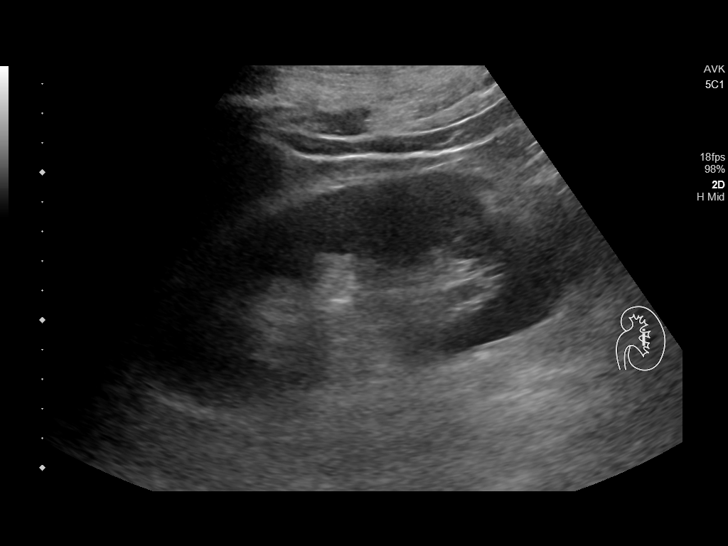
[im 37/56]
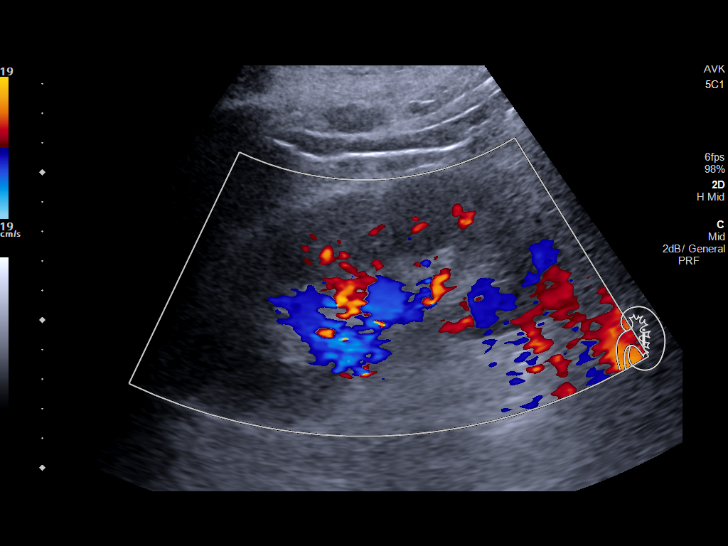
[im 42/56]
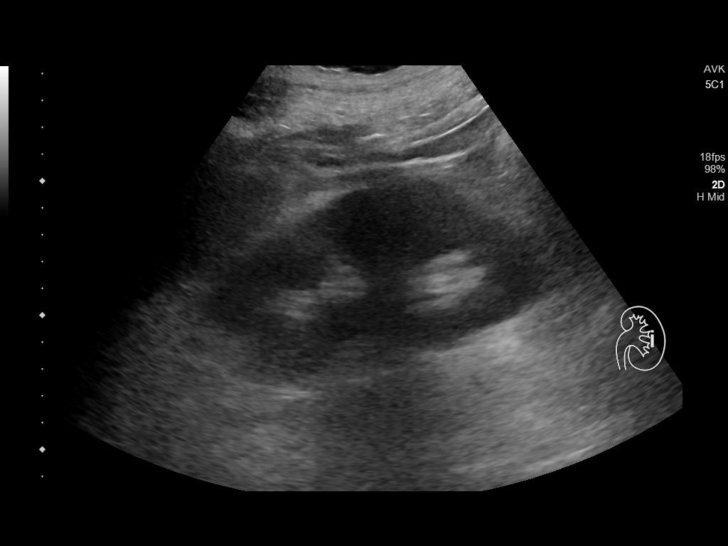
[im 46/56]
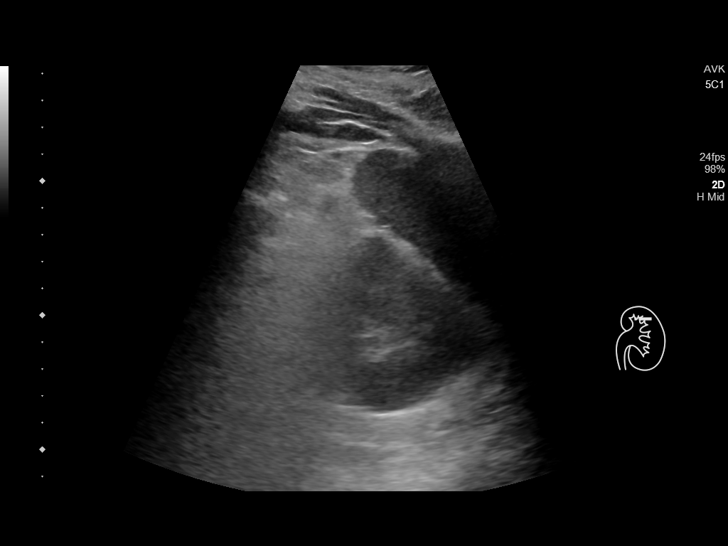
[im 51/56]
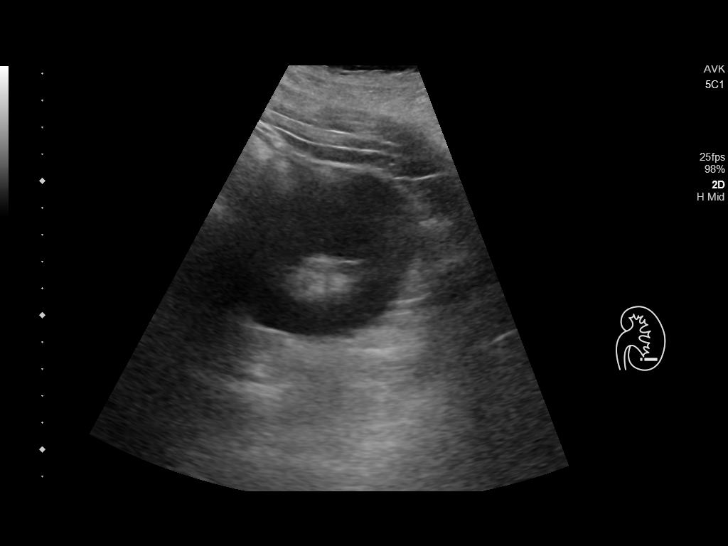
[im 56/56]
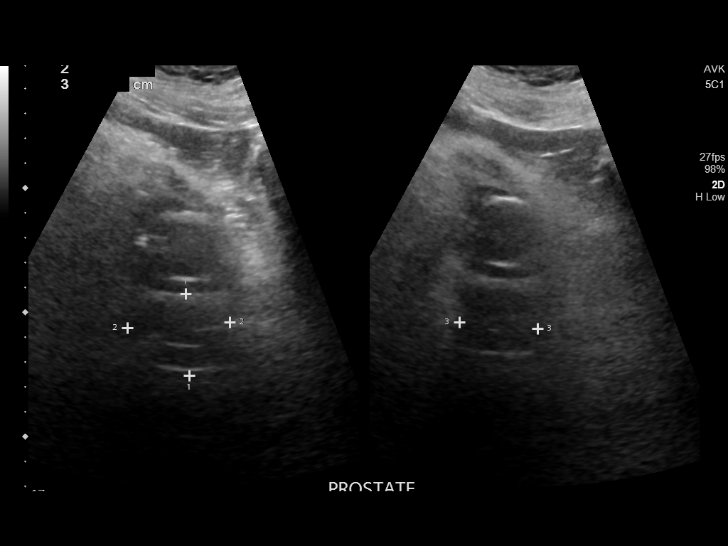

[14 of 25 positions shown; findings below may reference images not displayed]

FINDINGS: Right Kidney:

Renal measurements: 15.8 x 6.6 x 6.4 cm = volume: 349 mL. There is a
simple cyst measuring 4.1 cm in the interpolar region. There is a
simple appearing cyst in the lower pole measuring 4.4 cm.

Left Kidney:

Renal measurements: 3.5 x 6 x 5.1 cm = volume: 214 mL. Echogenicity
within normal limits. No mass or hydronephrosis visualized.

Bladder:

Poorly evaluated secondary to the presence of a Foley catheter.
IMPRESSION: 1. No acute sonographic abnormality detected.  No hydronephrosis.
2. Decompressed urinary bladder which limits evaluation.

## 2018-09-13 IMAGING — US VENOUS DOPPLER ULTRASOUND OF  LOWER EXTREMITIES
1 series · 13 of 24 positions shown · non-contrast
Comparison: None.

CLINICAL DATA: Leg pain and swelling.



[Series 1: venous doppler ultrasound of lower extremities · 13 of 58 slices shown]
[im 1/58]
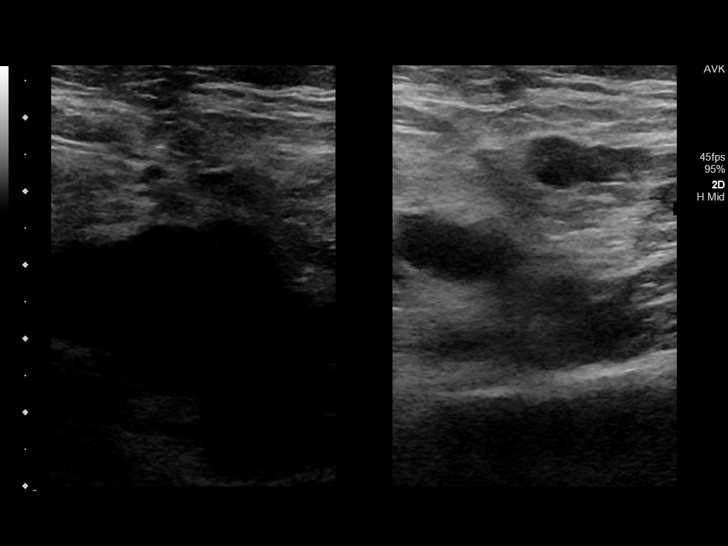
[im 5/58]
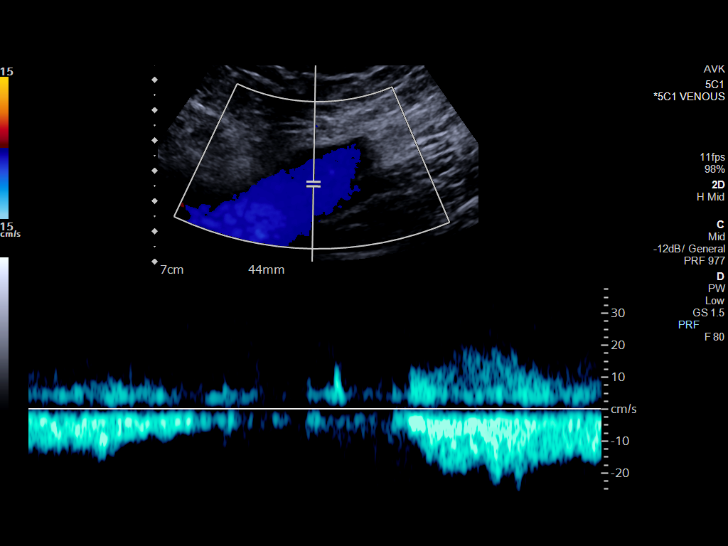
[im 10/58]
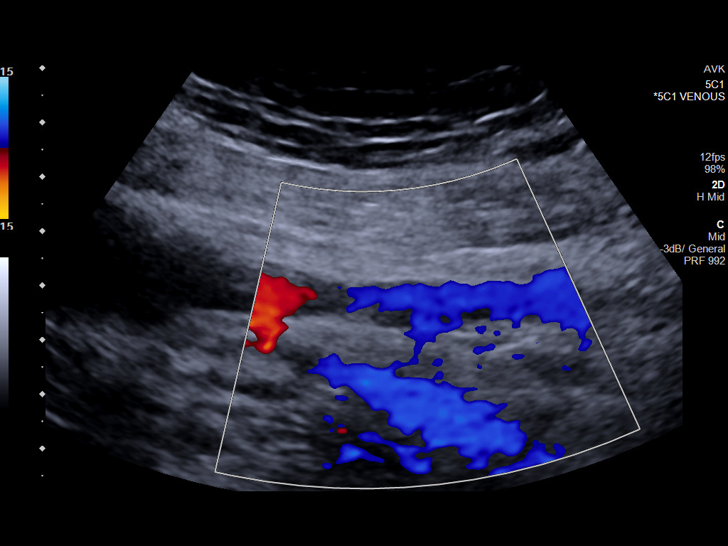
[im 15/58]
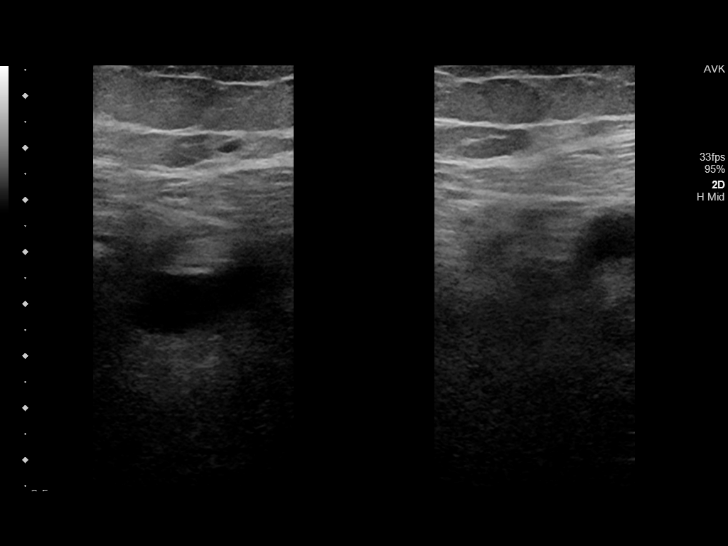
[im 20/58]
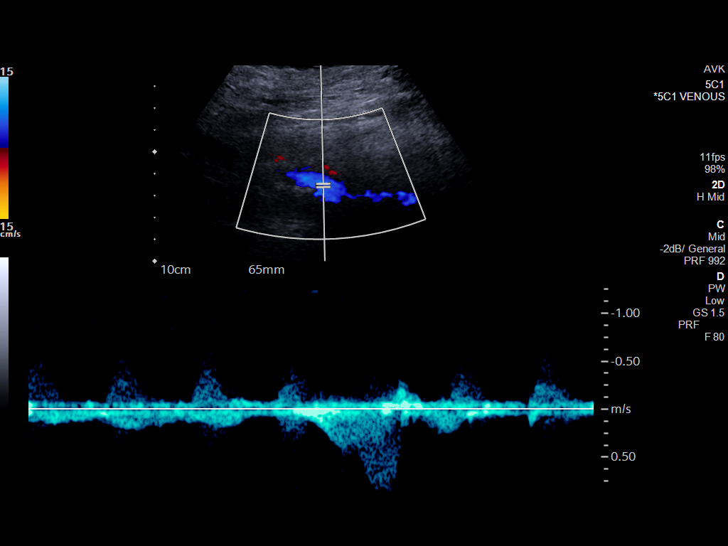
[im 25/58]
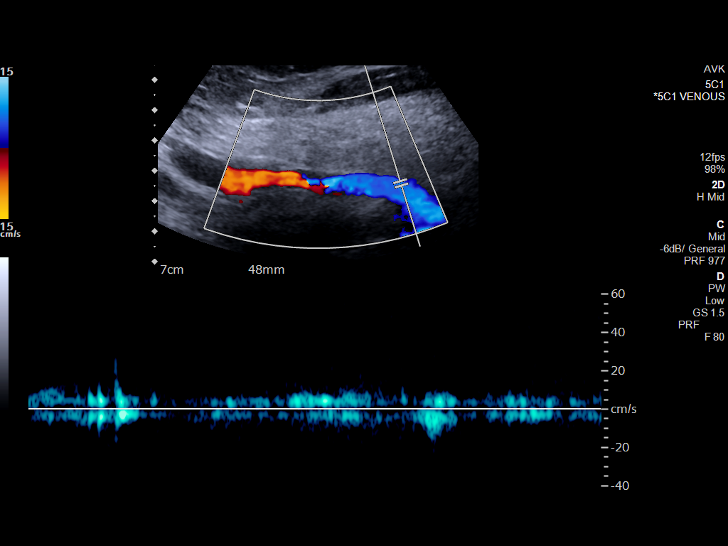
[im 30/58]
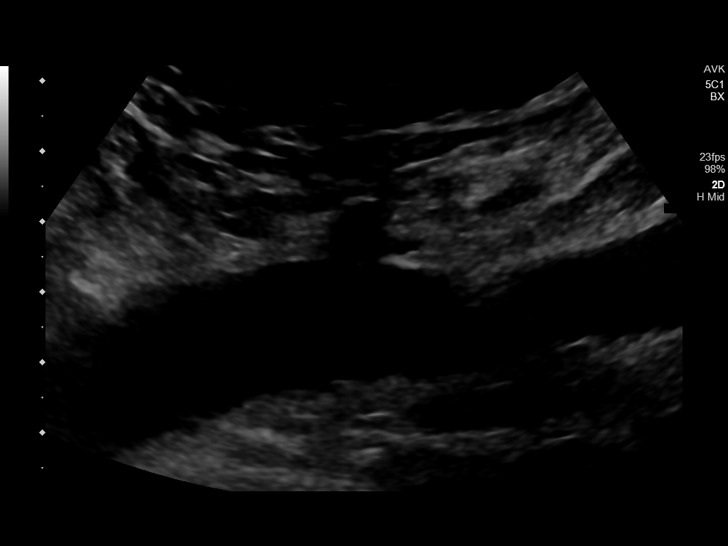
[im 33/58]
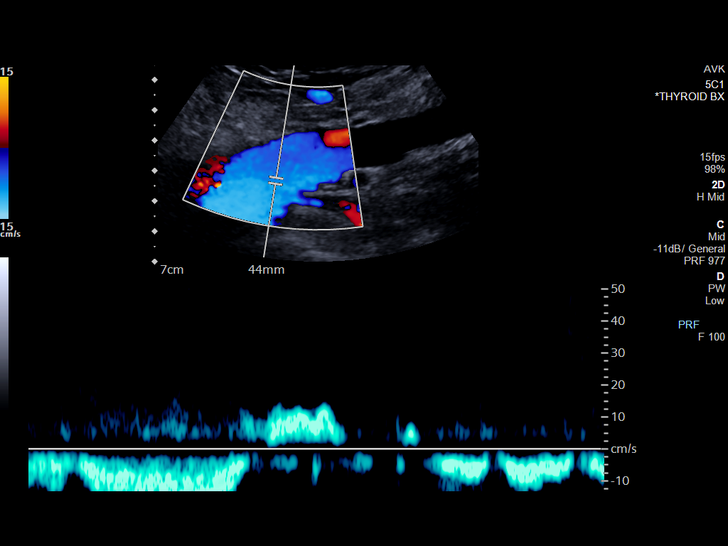
[im 38/58]
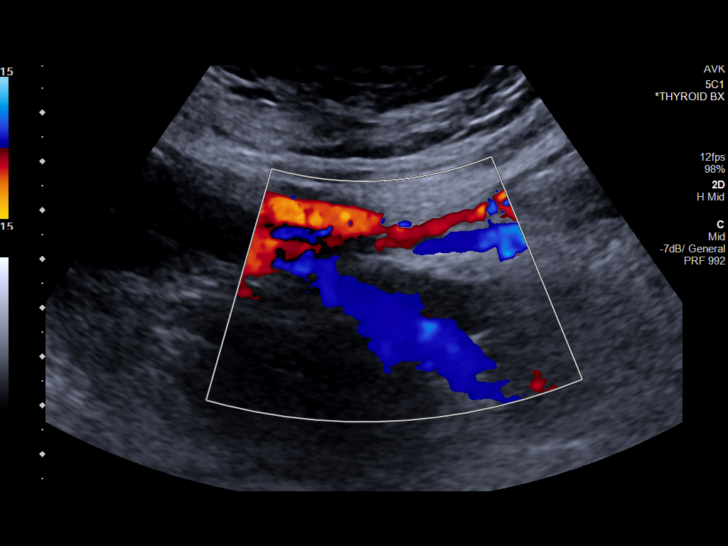
[im 43/58]
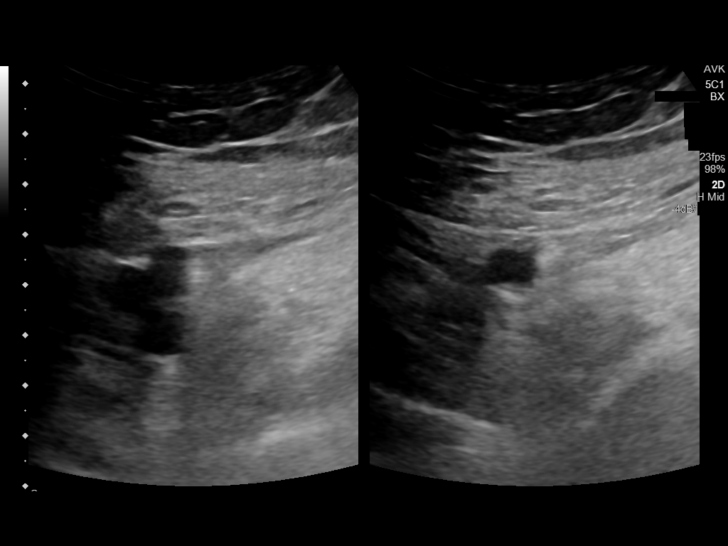
[im 48/58]
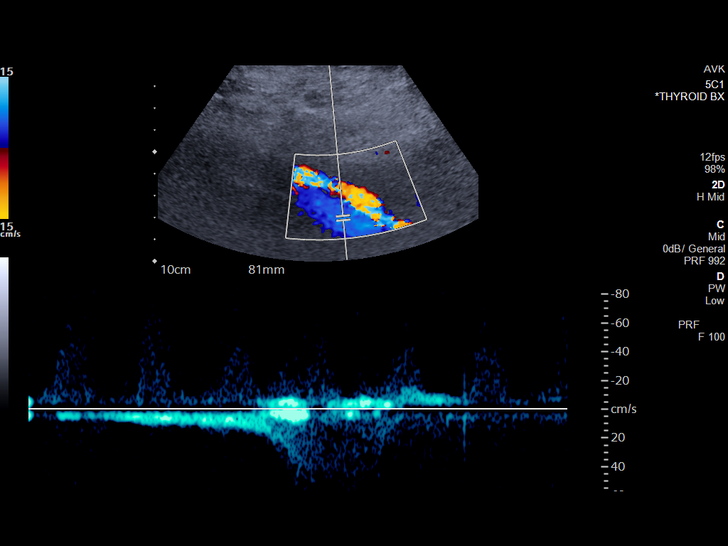
[im 53/58]
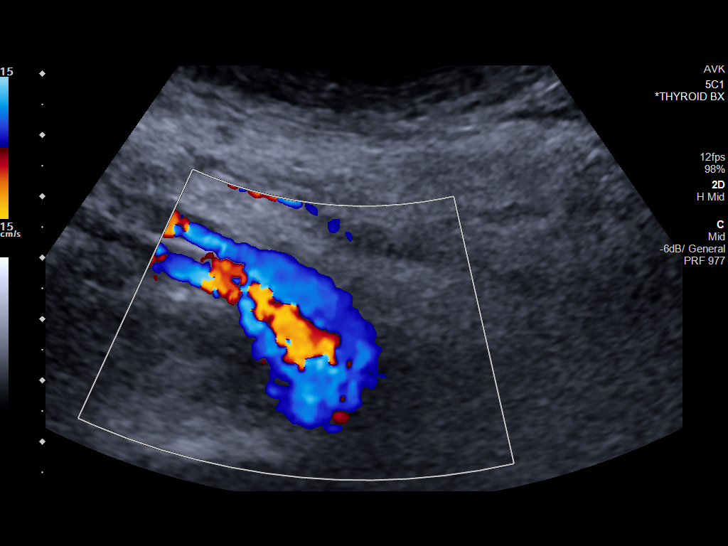
[im 58/58]
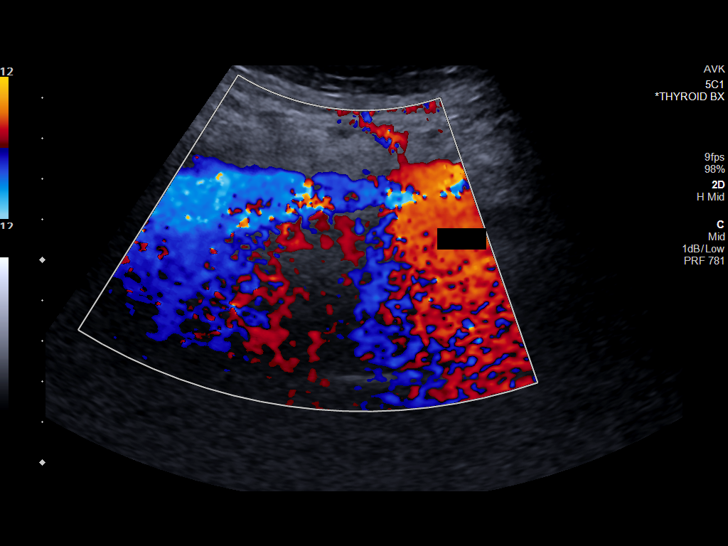

[13 of 24 positions shown; findings below may reference images not displayed]

FINDINGS: RIGHT LOWER EXTREMITY

Common Femoral Vein: No evidence of thrombus. Normal
compressibility, respiratory phasicity and response to augmentation.

Saphenofemoral Junction: No evidence of thrombus. Normal
compressibility and flow on color Doppler imaging.

Profunda Femoral Vein: No evidence of thrombus. Normal
compressibility and flow on color Doppler imaging.

Femoral Vein: No evidence of thrombus. Normal compressibility,
respiratory phasicity and response to augmentation.

Popliteal Vein: No evidence of thrombus. Normal compressibility,
respiratory phasicity and response to augmentation.

Calf Veins: No evidence of thrombus. Normal compressibility and flow
on color Doppler imaging. The peroneal vein was not well visualized.

Superficial Great Saphenous Vein: No evidence of thrombus. Normal
compressibility.

Venous Reflux:  None.

Other Findings:  None.

LEFT LOWER EXTREMITY

Common Femoral Vein: No evidence of thrombus. Normal
compressibility, respiratory phasicity and response to augmentation.

Saphenofemoral Junction: No evidence of thrombus. Normal
compressibility and flow on color Doppler imaging.

Profunda Femoral Vein: No evidence of thrombus. Normal
compressibility and flow on color Doppler imaging.

Femoral Vein: No evidence of thrombus. Normal compressibility,
respiratory phasicity and response to augmentation.

Popliteal Vein: No evidence of thrombus. Normal compressibility,
respiratory phasicity and response to augmentation.

Calf Veins: No evidence of thrombus. Normal compressibility and flow
on color Doppler imaging. The peroneal vein was not well visualized.

Superficial Great Saphenous Vein: No evidence of thrombus. Normal
compressibility.

Venous Reflux:  None.

Other Findings:  None.
IMPRESSION: No evidence of deep venous thrombosis in either lower extremity. The
bilateral peroneal veins were not well visualized.

## 2018-09-13 MED ORDER — DOCUSATE SODIUM 100 MG PO CAPS
100.0000 mg | ORAL_CAPSULE | Freq: Two times a day (BID) | ORAL | Status: DC
  Administered 2018-09-13 (×2): 100 mg via ORAL
  Filled 2018-09-13 (×2): qty 1

## 2018-09-13 NOTE — Progress Notes (Signed)
Temperature 102.1. Provider Hinton Dyer, NP) made aware and orders received to apply ice packs. Ice packs applied to groin axilla for elevated temperature. Will continue to monitor closely.

## 2018-09-13 NOTE — Consult Note (Signed)
CRITICAL CARE NOTE         SUBJECTIVE    Patient remains critically ill Patient is febrile despite Tylenol, he has chills and bilateral knees are hot to touch.  We will obtain ID consultation for evaluation possible PJI bilaterally   PAST MEDICAL HISTORY   Past Medical History:  Diagnosis Date  . Allergy   . Anxiety   . Arthritis    knees  . Asthma, mild intermittent, well-controlled   . Chronic obstructive asthma ([place])   . COPD (chronic obstructive pulmonary disease) ([place])   . Depression with anxiety   . Diabetes ([place])   . GERD (gastroesophageal reflux disease)   . HBP (high blood pressure)   . High cholesterol   . Renal cyst, right   . Sleep apnea    CPAP  . Swelling      SURGICAL HISTORY   Past Surgical History:  Procedure Laterality Date  . BLADDER REPAIR    . COLONOSCOPY WITH PROPOFOL N/A 04/07/2015   Procedure: COLONOSCOPY WITH PROPOFOL;  Surgeon: Lucilla Lame, MD;  Location: [facility];  Service: Endoscopy;  Laterality: N/A;  Diabetic - oral meds CPAP  . HAND SURGERY     4TH AND 5TH FINGER  . KNEE ARTHROSCOPY Right   . 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 VEIN SURGERY Right      FAMILY HISTORY   Family History  Problem Relation Age of Onset  . Hypertension Mother   . Diabetes Mother   . Diabetes Father   . Hypertension Father   . Cancer Father   . Seizures Daughter   . Lupus Daughter   . Cancer Maternal Grandfather        Prostate  . Diabetes Paternal Grandfather   . Prostate cancer Maternal Uncle   . Bladder Cancer Neg Hx   . Kidney cancer Neg Hx      SOCIAL HISTORY   Social History   Tobacco Use  . Smoking status: Former Smoker    Packs/day: 1.00    Years: 10.00    Pack years: 10.00    Types: Cigarettes    Start date:  04/20/1995    Last attempt to quit: 04/19/2005    Years since quitting: 13.4  . Smokeless tobacco: Never Used  Substance Use Topics  . Alcohol use: Yes    Alcohol/week: 10.0 standard drinks    Types: 10 Cans of beer per week    Comment: occasional  . Drug use: No     MEDICATIONS   Current Medication:  Current Facility-Administered Medications:  .  acetaminophen (TYLENOL) tablet 650 mg, 650 mg, Oral, Q6H PRN, 650 mg at 09/13/18 0923 **OR** acetaminophen (TYLENOL) suppository 650 mg, 650 mg, Rectal, Q6H PRN, Gladstone Lighter, MD .  albuterol (PROVENTIL) (2.5 MG/3ML) 0.083% nebulizer solution 2.5 mg, 2.5 mg, Nebulization, Q6H, Kalisetti, Radhika, MD, 2.5 mg at 09/13/18 0818 .  aspirin EC tablet 81 mg, 81 mg, Oral, Daily, Gladstone Lighter, MD, 81 mg at 09/13/18 0923 .  atenolol (TENORMIN) tablet 25 mg, 25 mg, Oral, QPM, Kalisetti, Radhika, MD .  ceFEPIme (MAXIPIME) 2 g in sodium chloride 0.9 % 100 mL IVPB, 2 g, Intravenous, Q12H, Rocky Morel, RPH, Last Rate: 200 mL/hr at 09/13/18 0922, 2 g at 09/13/18 0922 .  citalopram (CELEXA) tablet 20 mg, 20 mg, Oral, Daily, Gladstone Lighter, MD, 20 mg at 09/13/18 0925 .  docusate sodium (COLACE) capsule 100 mg, 100 mg, Oral, BID, Gladstone Lighter, MD, 100 mg  at 09/13/18 0923 .  fluticasone furoate-vilanterol (BREO ELLIPTA) 100-25 MCG/INH 1 puff, 1 puff, Inhalation, Daily, Gladstone Lighter, MD, 1 puff at 09/13/18 0926 .  heparin injection 5,000 Units, 5,000 Units, Subcutaneous, Q8H, Gladstone Lighter, MD, 5,000 Units at 09/13/18 0540 .  HYDROcodone-acetaminophen (NORCO/VICODIN) 5-325 MG per tablet 1-2 tablet, 1-2 tablet, Oral, Q4H PRN, Gladstone Lighter, MD, 1 tablet at 09/12/18 2247 .  HYDROcodone-acetaminophen (NORCO/VICODIN) 5-325 MG per tablet 2 tablet, 2 tablet, Oral, Q4H PRN, Ottie Glazier, MD, 2 tablet at 09/13/18 0540 .  insulin aspart (novoLOG) injection 0-5 Units, 0-5 Units, Subcutaneous, QHS, Kalisetti, Radhika, MD .  insulin  aspart (novoLOG) injection 0-9 Units, 0-9 Units, Subcutaneous, TID WC, Gladstone Lighter, MD, 2 Units at 09/13/18 0801 .  loratadine (CLARITIN) tablet 10 mg, 10 mg, Oral, Daily, Gladstone Lighter, MD, 10 mg at 09/13/18 0923 .  ondansetron (ZOFRAN) tablet 4 mg, 4 mg, Oral, Q6H PRN **OR** ondansetron (ZOFRAN) injection 4 mg, 4 mg, Intravenous, Q6H PRN, Tressia Miners, Radhika, MD .  phenylephrine (NEO-SYNEPHRINE) 10 mg in sodium chloride 0.9 % 250 mL (0.04 mg/mL) infusion, 0-400 mcg/min, Intravenous, Titrated, Darel Hong D, NP .  rosuvastatin (CRESTOR) tablet 20 mg, 20 mg, Oral, Daily, Gladstone Lighter, MD, 20 mg at 09/13/18 0924 .  sodium bicarbonate 150 mEq in dextrose 5 % 1,000 mL infusion, , Intravenous, Continuous, Darel Hong D, NP, Last Rate: 125 mL/hr at 09/13/18 0605 .  tamsulosin (FLOMAX) capsule 0.4 mg, 0.4 mg, Oral, Daily, Gladstone Lighter, MD, 0.4 mg at 09/13/18 0925 .  vancomycin (VANCOCIN) IVPB 1000 mg/200 mL premix, 1,000 mg, Intravenous, Q24H, Gladstone Lighter, MD    ALLERGIES   Lipitor [atorvastatin]; Other; and Viagra  [sildenafil citrate]    REVIEW OF SYSTEMS    10 point ROS is negative except as per subjective findings  PHYSICAL EXAMINATION   Vitals:   09/13/18 1000 09/13/18 1100  BP:    Pulse:    Resp:    Temp:  99.6 F (37.6 C)  SpO2: 98%     GENERAL: Patient is diaphoretic febrile with chills HEAD: Normocephalic, atraumatic.  EYES: Pupils equal, round, reactive to light.  No scleral icterus.  MOUTH: Moist mucosal membrane. NECK: Supple. No thyromegaly. No nodules. No JVD.  PULMONARY: Clear to auscultation bilaterally CARDIOVASCULAR: S1 and S2. Regular rate and rhythm. No murmurs, rubs, or gallops.  GASTROINTESTINAL: Soft, nontender, non-distended. No masses. Positive bowel sounds. No hepatosplenomegaly.  MUSCULOSKELETAL: Bilateral knees with staples are hot to touch edematous and tender to palpation NEUROLOGIC: Mild distress due to acute  illness SKIN:intact,warm,dry   LABS AND IMAGING     LAB RESULTS: Recent Labs  Lab 09/12/18 0955 09/13/18 0344  NA 126* 126*  K 4.4 4.1  CL 90* 96*  CO2 17* 18*  BUN 28* 34*  CREATININE 3.71* 4.31*  GLUCOSE 129* 196*   Recent Labs  Lab 09/12/18 0955 09/13/18 0344  HGB 11.5* 8.7*  HCT 35.7* 26.7*  WBC 16.1* 9.4  PLT 137* 137*     IMAGING RESULTS: No results found.    ASSESSMENT AND PLAN    -Multidisciplinary rounds held today   Septic shock   -Possibly due to urinary tract infection with severe AKI stage IV -concern for possible PJI of bilateral knees as they are hot to touch, swollen/tender, patient with fevers, chills hypotension  - on Vancomycing and cefepime IV -pt needs joint fluid studies to evalutae for PJI  - will place ID consult - appreciate input - orthopedics on case- appreciate input -follow  ABG and LA -follow up cultures Leukocytosis improved on antibiotics   Acute kidney injury stage IV    -DC nonessential nephrotoxic meds   -Nephrology consultation  -Status post IV fluid rehydration -net positive 8 L   ID -continue IV abx as prescibed -follow up cultures  GI/Nutrition GI PROPHYLAXIS as indicated DIET-->TF's as tolerated Constipation protocol as indicated  ENDO - ICU hypoglycemic\Hyperglycemia protocol -check FSBS per protocol   ELECTROLYTES -follow labs as needed -replace as needed -pharmacy consultation   DVT/GI PRX ordered -SCDs  TRANSFUSIONS AS NEEDED MONITOR FSBS ASSESS the need for LABS as needed   Critical care provider statement:    Critical care time (minutes):  35   Critical care time was exclusive of:  Separately billable procedures and treating other patients   Critical care was necessary to treat or prevent imminent or life-threatening deterioration of the following conditions:   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  I assumed direction of critical care for this patient from another provider in my specialty: no    This document was prepared using Dragon voice recognition software and may include unintentional dictation errors.    Ottie Glazier, M.D.  Division of [facility]

## 2018-09-13 NOTE — Progress Notes (Signed)
[facility] NAME: Isaac Cross    MR#:  [id number]  DATE OF BIRTH:  1961/04/18  SUBJECTIVE:  patient came in with dizziness and weakness. He had some fever. Found to have acute renal failure.  REVIEW OF SYSTEMS:   Review of Systems  Constitutional: Negative for chills, fever and weight loss.  HENT: Negative for ear discharge, ear pain and nosebleeds.   Eyes: Negative for blurred vision, pain and discharge.  Respiratory: Negative for sputum production, shortness of breath, wheezing and stridor.   Cardiovascular: Negative for chest pain, palpitations, orthopnea and PND.  Gastrointestinal: Positive for nausea. Negative for abdominal pain, diarrhea and vomiting.  Genitourinary: Negative for frequency and urgency.  Musculoskeletal: Positive for joint pain. Negative for back pain.  Neurological: Positive for weakness. Negative for sensory change, speech change and focal weakness.  Psychiatric/Behavioral: Negative for depression and hallucinations. The patient is not nervous/anxious.    Tolerating Diet:yes Tolerating PT: pending  DRUG ALLERGIES:   Allergies  Allergen Reactions  . Lipitor [Atorvastatin] Hives and Itching  . Other   . Viagra  [Sildenafil Citrate]     vision loss    VITALS:  Blood pressure 103/60, pulse (!) 121, temperature 99.6 F (37.6 C), resp. rate (!) 22, height 6\' 4"  (1.93 m), weight (!) 145.2 kg, SpO2 100 %.  PHYSICAL EXAMINATION:   Physical Exam  GENERAL:  58 y.o.-year-old patient lying in the bed with no acute distress.  EYES: Pupils equal, round, reactive to light and accommodation. No scleral icterus. Extraocular muscles intact.  HEENT: Head atraumatic, normocephalic. Oropharynx and nasopharynx clear.  NECK:  Supple, no jugular venous distention. No thyroid enlargement, no tenderness.  LUNGS: Normal breath sounds bilaterally, no wheezing, rales, rhonchi. No use of accessory muscles of  respiration.  CARDIOVASCULAR: S1, S2 normal. No murmurs, rubs, or gallops.  ABDOMEN: Soft, nontender, nondistended. Bowel sounds present. No organomegaly or mass.  EXTREMITIES: bilateral knee joint Staples present. Skin looks normal. No drainage from the incision site.  NEUROLOGIC: Cranial nerves II through XII are intact. No focal Motor or sensory deficits b/l.   PSYCHIATRIC:  patient is alert and oriented x 3.  SKIN: No obvious rash, lesion, or ulcer.   LABORATORY PANEL:  CBC Recent Labs  Lab 09/13/18 0344  WBC 9.4  HGB 8.7*  HCT 26.7*  PLT 137*    Chemistries  Recent Labs  Lab 09/12/18 0955 09/13/18 0344  NA 126* 126*  K 4.4 4.1  CL 90* 96*  CO2 17* 18*  GLUCOSE 129* 196*  BUN 28* 34*  CREATININE 3.71* 4.31*  CALCIUM 9.7 7.9*  AST 70*  --   ALT 42  --   ALKPHOS 86  --   BILITOT 0.7  --    Cardiac Enzymes No results for input(s): TROPONINI in the last 168 hours. RADIOLOGY:  Dg Chest Port 1 View  Result Date: 09/12/2018 CLINICAL DATA:  58 year old male with a history of increasing dizziness EXAM: PORTABLE CHEST 1 VIEW COMPARISON:  11/09/2006 FINDINGS: Cardiomediastinal silhouette unchanged in size and contour. No pneumothorax or pleural effusion. Low lung volumes with coarsened interstitial markings. Linear opacity at the left lung base with no confluent airspace disease. IMPRESSION: Low lung volumes without evidence of acute cardiopulmonary disease. Electronically Signed   By: Corrie Mckusick D.O.   On: 09/12/2018 10:19   Dg Knee Complete 4 Views Left  Result Date: 09/12/2018 CLINICAL DATA:  57 year old male with a history of knee  replacement EXAM: LEFT KNEE - COMPLETE 4+ VIEW COMPARISON:  None. FINDINGS: No acute displaced fracture. Surgical changes of arthroplasty left knee. Joint effusion. Surgical staples project within the soft tissues of the midline. Soft tissue swelling at the surgical site. IMPRESSION: Early surgical changes of left knee arthroplasty, with soft  tissue swelling and joint effusion. Electronically Signed   By: Corrie Mckusick D.O.   On: 09/12/2018 10:15   ASSESSMENT AND PLAN:   Isaac Cross  is a 58 y.o. male with a known history of arthritis, COPD not on home oxygen, hypertension, sleep apnea, non-insulin-dependent diabetes mellitus presents to hospital secondary to dizziness, weakness and a fall this morning.  1.Sepsis-likely source urine at this point.   -Bilateral knee staples in place with no signs of infection.  - Ortho has been consult-- Dr. Harlow Mares noted -Blood cultures negative - urine cultures negative -We will start on vancomycin and cefepime -Procalcitonin is elevated.   -Continue to recycle lactic acid -IV fluids-- with bicarb  2.  Acute renal failure-likely ATN from sepsis. -creat 3.7--4.31 -  Minimal urine output.   -Renal ultrasound ordered. - Nephrology  Consult noted - Serological work-up has been ordered as well. -Hold lisinopril and metformin and other nephrotoxins.  3.  Bilateral knee replacement surgery-bilateral knee surgery done about 10 days ago.  X-ray showing soft tissue swelling, patient had a fall earlier today onto his knees. -Orthopedics input noted  Other than minimal swelling.  4.  Hypertension-hold lisinopril.  5.  Diabetes mellitus-we will start sliding scale insulin.  Hold metformin given worsening renal failure.  6.  DVT prophylaxis-we will hold Xarelto and will start subcutaneous heparin while in the hospital     CODE STATUS: full  DVT Prophylaxis: *heparin**  TOTAL TIME TAKING CARE OF THIS PATIENT: 30* minutes.  >50% time spent on counselling and coordination of care  POSSIBLE D/C IN **?* DAYS, DEPENDING ON CLINICAL CONDITION.  Note: This dictation was prepared with Dragon dictation along with smaller phrase technology. Any transcriptional errors that result from this process are unintentional.  Fritzi Mandes M.D on 09/13/2018 at 1:54 PM  Between 7am to 6pm - Pager -  [phone]  After 6pm go to [email or web address] - password [email or web address] [facility]  Office  [phone]  CC: Primary care physician; Steele Sizer, MDPatient ID: Isaac Cross, male   DOB: 01-Mar-1961, 58 y.o.   MRN: [id number]

## 2018-09-13 NOTE — Progress Notes (Signed)
[facility]  ROUNDING NOTE   Subjective:   Tmax 102.7 UOP 79mL  Phenylephrine overnight.   Objective:  Vital signs in last 24 hours:  Temp:  [98.2 F (36.8 C)-102.7 F (39.3 C)] 99.6 F (37.6 C) (05/27 1100) Pulse Rate:  [37-122] 116 (05/27 0900) Resp:  [17-41] 27 (05/27 0900) BP: (79-137)/(37-73) 137/58 (05/27 0900) SpO2:  [93 %-100 %] 98 % (05/27 1000)  Weight change:  Filed Weights   09/12/18 0935  Weight: (!) 145.2 kg    Intake/Output: I/O last 3 completed shifts: In: 6499.4 [P.O.:300; I.V.:5999.4; Other:100; IV Piggyback:100] Out: 750 [Urine:750]   Intake/Output this shift:  No intake/output data recorded.  Physical Exam: General: NAD,   Head: Normocephalic, atraumatic. Moist oral mucosal membranes  Eyes: Anicteric, PERRL  Neck: Supple, trachea midline  Lungs:  Clear to auscultation  Heart: Regular rate and rhythm  Abdomen:  Soft, nontender, obese  Extremities:  ++ peripheral edema. +erythema around bilateral knee incisions  Neurologic: Alert to self  Skin: No lesions  Access: none    Basic Metabolic Panel: Recent Labs  Lab 09/12/18 0955 09/13/18 0344  NA 126* 126*  K 4.4 4.1  CL 90* 96*  CO2 17* 18*  GLUCOSE 129* 196*  BUN 28* 34*  CREATININE 3.71* 4.31*  CALCIUM 9.7 7.9*    Liver Function Tests: Recent Labs  Lab 09/12/18 0955  AST 70*  ALT 42  ALKPHOS 86  BILITOT 0.7  PROT 7.6  ALBUMIN 3.0*   Recent Labs  Lab 09/12/18 0955  LIPASE 74*   No results for input(s): AMMONIA in the last 168 hours.  CBC: Recent Labs  Lab 09/12/18 0955 09/13/18 0344  WBC 16.1* 9.4  NEUTROABS 10.3*  --   HGB 11.5* 8.7*  HCT 35.7* 26.7*  MCV 82.4 81.9  PLT 137* 137*    Cardiac Enzymes: No results for input(s): CKTOTAL, CKMB, CKMBINDEX, TROPONINI in the last 168 hours.  BNP: Invalid input(s): POCBNP  CBG: Recent Labs  Lab 09/12/18 1750 09/12/18 1948 09/12/18 2335 09/13/18 0749  GLUCAP 212* 167* 218* 199*     Microbiology: Results for orders placed or performed during the hospital encounter of 09/12/18  Blood Culture (routine x 2)     Status: None (Preliminary result)   Collection Time: 09/12/18  9:56 AM  Result Value Ref Range Status   Specimen Description BLOOD LEFT HAND  Final   Special Requests   Final    BOTTLES DRAWN AEROBIC AND ANAEROBIC Blood Culture adequate volume   Culture   Final    NO GROWTH < 24 HOURS Performed at [facility], [address], [place], [place] [address]    Report Status PENDING  Incomplete  Urine culture     Status: None   Collection Time: 09/12/18 10:01 AM  Result Value Ref Range Status   Specimen Description   Final    URINE, RANDOM Performed at [facility], [address], [place], [place] [address]    Special Requests   Final    NONE Performed at [facility], [address], [place], [place] [address]    Culture   Final    NO GROWTH Performed at [facility], [place] [address] [place], [place] [address]    Report Status 09/13/2018 FINAL  Final  SARS Coronavirus 2 (CEPHEID- Performed in [facility] hospital lab), Hosp Order     Status: None   Collection Time: 09/12/18 10:02 AM  Result Value Ref Range Status  SARS Coronavirus 2 NEGATIVE NEGATIVE Final    Comment: (NOTE) If result is NEGATIVE SARS-CoV-2 target nucleic acids are NOT DETECTED. The SARS-CoV-2 RNA is generally detectable in upper and lower  respiratory specimens during the acute phase of infection. The lowest  concentration of SARS-CoV-2 viral copies this assay can detect is 250  copies / mL. A negative result does not preclude SARS-CoV-2 infection  and should not be used as the sole basis for treatment or other  patient management decisions.  A negative result may occur with  improper specimen collection / handling, submission of specimen other  than nasopharyngeal swab, presence of viral mutation(s) within the  areas targeted by this  assay, and inadequate number of viral copies  (<250 copies / mL). A negative result must be combined with clinical  observations, patient history, and epidemiological information. If result is POSITIVE SARS-CoV-2 target nucleic acids are DETECTED. The SARS-CoV-2 RNA is generally detectable in upper and lower  respiratory specimens dur ing the acute phase of infection.  Positive  results are indicative of active infection with SARS-CoV-2.  Clinical  correlation with patient history and other diagnostic information is  necessary to determine patient infection status.  Positive results do  not rule out bacterial infection or co-infection with other viruses. If result is PRESUMPTIVE POSTIVE SARS-CoV-2 nucleic acids MAY BE PRESENT.   A presumptive positive result was obtained on the submitted specimen  and confirmed on repeat testing.  While 2019 novel coronavirus  (SARS-CoV-2) nucleic acids may be present in the submitted sample  additional confirmatory testing may be necessary for epidemiological  and / or clinical management purposes  to differentiate between  SARS-CoV-2 and other Sarbecovirus currently known to infect humans.  If clinically indicated additional testing with an alternate test  methodology [phone]) is advised. The SARS-CoV-2 RNA is generally  detectable in upper and lower respiratory sp ecimens during the acute  phase of infection. The expected result is Negative. Fact Sheet for Patients:  StrictlyIdeas.no Fact Sheet for Healthcare Providers: BankingDealers.co.za This test is not yet approved or cleared by the [place] FDA and has been authorized for detection and/or diagnosis of SARS-CoV-2 by FDA under an Emergency Use Authorization (EUA).  This EUA will remain in effect (meaning this test can be used) for the duration of the COVID-19 declaration under Section 564(b)(1) of the Act, 21 U.S.C. section 360bbb-3(b)(1),  unless the authorization is terminated or revoked sooner. Performed at [facility], [address] [place], [place] [address]   Blood Culture (routine x 2)     Status: None (Preliminary result)   Collection Time: 09/12/18 10:06 AM  Result Value Ref Range Status   Specimen Description BLOOD LEFT ANTECUBITAL  Final   Special Requests   Final    BOTTLES DRAWN AEROBIC AND ANAEROBIC Blood Culture adequate volume   Culture   Final    NO GROWTH < 24 HOURS Performed at [facility], [address] [place], [place] [address]    Report Status PENDING  Incomplete  MRSA PCR Screening     Status: None   Collection Time: 09/12/18  7:04 PM  Result Value Ref Range Status   MRSA by PCR NEGATIVE NEGATIVE Final    Comment:        The GeneXpert MRSA Assay (FDA approved for NASAL specimens only), is one component of a comprehensive MRSA colonization surveillance program. It is not intended to diagnose MRSA infection nor to guide or monitor treatment for MRSA infections.  Performed at [facility], [address] [place], [place] [address]     Coagulation Studies: Recent Labs    09/12/18 0955  LABPROT 15.3*  INR 1.2    Urinalysis: Recent Labs    09/12/18 1001  COLORURINE AMBER*  LABSPEC 1.026  PHURINE 5.0  GLUCOSEU NEGATIVE  HGBUR NEGATIVE  BILIRUBINUR NEGATIVE  KETONESUR 5*  PROTEINUR 100*  NITRITE NEGATIVE  LEUKOCYTESUR NEGATIVE      Imaging: Dg Chest Port 1 View  Result Date: 09/12/2018 CLINICAL DATA:  58 year old male with a history of increasing dizziness EXAM: PORTABLE CHEST 1 VIEW COMPARISON:  11/09/2006 FINDINGS: Cardiomediastinal silhouette unchanged in size and contour. No pneumothorax or pleural effusion. Low lung volumes with coarsened interstitial markings. Linear opacity at the left lung base with no confluent airspace disease. IMPRESSION: Low lung volumes without evidence of acute cardiopulmonary disease. Electronically Signed    By: Corrie Mckusick D.O.   On: 09/12/2018 10:19   Dg Knee Complete 4 Views Left  Result Date: 09/12/2018 CLINICAL DATA:  58 year old male with a history of knee replacement EXAM: LEFT KNEE - COMPLETE 4+ VIEW COMPARISON:  None. FINDINGS: No acute displaced fracture. Surgical changes of arthroplasty left knee. Joint effusion. Surgical staples project within the soft tissues of the midline. Soft tissue swelling at the surgical site. IMPRESSION: Early surgical changes of left knee arthroplasty, with soft tissue swelling and joint effusion. Electronically Signed   By: Corrie Mckusick D.O.   On: 09/12/2018 10:15     Medications:   . ceFEPime (MAXIPIME) IV 2 g (09/13/18 0922)  . phenylephrine (NEO-SYNEPHRINE) Adult infusion    .  sodium bicarbonate  infusion 1000 mL 125 mL/hr at 09/13/18 0605  . vancomycin     . albuterol  2.5 mg Nebulization Q6H  . aspirin EC  81 mg Oral Daily  . atenolol  25 mg Oral QPM  . citalopram  20 mg Oral Daily  . docusate sodium  100 mg Oral BID  . fluticasone furoate-vilanterol  1 puff Inhalation Daily  . heparin  5,000 Units Subcutaneous Q8H  . insulin aspart  0-5 Units Subcutaneous QHS  . insulin aspart  0-9 Units Subcutaneous TID WC  . loratadine  10 mg Oral Daily  . rosuvastatin  20 mg Oral Daily  . tamsulosin  0.4 mg Oral Daily   acetaminophen **OR** acetaminophen, HYDROcodone-acetaminophen, HYDROcodone-acetaminophen, ondansetron **OR** ondansetron (ZOFRAN) IV  Assessment/ Plan:  Isaac Cross is a 58 y.o. black male with hyeprtension, COPD, diabetes mellitus type II who underwent bilateral knee replacement surgeries last week. who was admitted to [facility] on 09/12/2018 for sepsis  1. Acute renal failure with metabolic acidosis: baseline creatinine of 1.07 with normal GFR in 06/12/18.  TMP/SMX could be contributing to renal failure Nonoliguric urine output - Discontinue IV normal saline and continue sodium bicarbonate gtt - Holding metformin, lisinopril and  TMP/SMX  2. Septic shock/hypotension - unclear source Appreciate ortho input.  Recommend ID consult  3. Hyponatremia: secondary to renal failure and volume status. Continue to monitor.    LOS: 1 Emarion Toral 5/27/202011:16 AM

## 2018-09-13 NOTE — Consult Note (Signed)
Name: Isaac Cross MRN: [id number] DOB: 20-Jun-1960    ADMISSION DATE:  09/12/2018 CONSULTATION DATE:  09/12/2018  REFERRING MD :  Dr. Tressia Miners  CHIEF COMPLAINT:  Sepsis and AKI  BRIEF PATIENT DESCRIPTION:  58 y.o. Male with recent bilateral knee replacement surgeries (approximately 9 days ago) admitted with Sepsis of unknown source (suspect questionable UTI), Acute Kidney Injury, and Hyponatremia.  Orthopedics and Nephrology are following.  SIGNIFICANT EVENTS  5/26>> admission to stepdown unit  STUDIES:  X-ray Left Knee 5/26>>Early surgical changes of left knee arthroplasty, with soft tissue swelling and joint effusion. Renal Ultrasound 5/27>>  CULTURES: Blood x2 5/26>> Urine 5/26>. SARS-CoV-2 5/26>> negative  ANTIBIOTICS: Cefepime 5/26>> Vancomycin 5/26>>  HISTORY OF PRESENT ILLNESS:   [name] is a 58 year old male with a past medical history notable for COPD, asthma, sleep apnea, diabetes, hypertension, hyperlipidemia, depression, anxiety who presented to [facility] ED on 09/12/2018 with complaints of weakness, dizziness, and a fall.  He underwent bilateral knee replacement surgeries approximately 9 days ago, which he reports he did well for the first week.  However over the last 3 to 4 days he has been having fevers, chills, dysuria, decreased urine output, and poor p.o. intake.  He denies any shortness of breath, cough, chest pain, abdominal pain, or sick contacts.  He does report that he fell out of bed this morning and landed onto his knees.  Initial work-up in the ED revealed WBC 16.1, lactic acid 4.9, pro calcitonin 6.97, creatinine 3.71, sodium 126, serum bicarb 17, anion gap 19, lipase 74, AST 70, hemoglobin 11.5, platelets 137.  His COVID 19 PCR is negative.  Urinalysis is concerning for possible UTI. CXR with low lung volumes, otherwise negative for acute cardiopulmonary disease.  He met sepsis criteria, therefore code sepsis was initiated and he received 4.5 L of IV  fluids and was placed on Cefepime and Vancomycin.  He is being admitted to [facility] stepdown unit for further work-up and treatment of sepsis of unclear source (suspect questionable UTI), acute kidney injury, and hyponatremia.  PCCM is consulted for further management  PAST MEDICAL HISTORY :   has a past medical history of Allergy, Anxiety, Arthritis, Asthma, mild intermittent, well-controlled, Chronic obstructive asthma ([place]), COPD (chronic obstructive pulmonary disease) ([place]), Depression with anxiety, Diabetes ([place]), GERD (gastroesophageal reflux disease), HBP (high blood pressure), High cholesterol, Renal cyst, right, Sleep apnea, and Swelling.  has a past surgical history that includes Vein Surgery (Right); Bladder repair; Hand surgery; Knee arthroscopy (Right); Colonoscopy with propofol (N/A, 04/07/2015); and Replacement total knee bilateral (Bilateral, 09/01/2018). Prior to Admission medications   Medication Sig Start Date End Date Taking? Authorizing Provider  acetaminophen (TYLENOL) 500 MG tablet Take 1 tablet (500 mg total) by mouth every 6 (six) hours as needed. 12/08/16  Yes Sowles, Drue Stager, MD  albuterol (PROVENTIL HFA;VENTOLIN HFA) 108 (90 Base) MCG/ACT inhaler INHALE 2 PUFFS INTO THE LUNGS 4 (FOUR) TIMES DAILY. 04/02/18  Yes Steele Sizer, MD  aspirin 81 MG tablet Take 81 mg by mouth daily.   Yes [provider]  atenolol (TENORMIN) 25 MG tablet Take 1 tablet (25 mg total) by mouth every evening. 06/07/18  Yes Sowles, Drue Stager, MD  cephALEXin (KEFLEX) 500 MG capsule Take 500 mg by mouth 3 (three) times daily. 09/11/18  Yes [provider]  citalopram (CELEXA) 20 MG tablet Take 1 tablet (20 mg total) by mouth daily. 06/07/18  Yes Sowles, Drue Stager, MD  fluticasone furoate-vilanterol (BREO ELLIPTA) 100-25 MCG/INH AEPB Inhale 1 puff into  the lungs daily. 12/02/17  Yes Sowles, Drue Stager, MD  lisinopril (PRINIVIL,ZESTRIL) 40 MG tablet Take 1 tablet (40 mg total) by mouth daily. 06/07/18   Yes Sowles, Drue Stager, MD  metFORMIN (GLUCOPHAGE) 1000 MG tablet Take 1 tablet (1,000 mg total) by mouth 2 (two) times daily with a meal. 06/07/18  Yes Sowles, Drue Stager, MD  Multiple Vitamins-Minerals (MENS MULTIVITAMIN PLUS) TABS Take by mouth.   Yes [provider]  Omega-3 Fatty Acids (FISH OIL) 1000 MG CAPS Take by mouth daily.   Yes [provider]  oxyCODONE-acetaminophen (PERCOCET/ROXICET) 5-325 MG tablet Take 1 tablet by mouth every 4 (four) hours.   Yes [provider]  rosuvastatin (CRESTOR) 20 MG tablet TAKE 1 TABLET BY MOUTH EVERY DAY 05/10/18  Yes Sowles, Drue Stager, MD  SENNA-PLUS 8.6-50 MG tablet Take 2 tablets by mouth every morning. 09/02/18  Yes [provider]  sulfamethoxazole-trimethoprim (BACTRIM DS) 800-160 MG tablet Take 1 tablet by mouth 2 (two) times a day. 09/11/18  Yes [provider]  tamsulosin (FLOMAX) 0.4 MG CAPS capsule Take 1 capsule (0.4 mg total) by mouth daily. 09/05/18  Yes Sowles, Drue Stager, MD  XARELTO 10 MG TABS tablet Take 10 mg by mouth daily.  09/02/18  Yes [provider]  cetirizine (ZYRTEC) 10 MG tablet Take 10 mg by mouth daily.    [provider]  diclofenac sodium (VOLTAREN) 1 % GEL APPLY 4 GRAMS TOPICALLY FOUR TIMES A DAY 07/13/17   Ancil Boozer, Drue Stager, MD  lidocaine (XYLOCAINE) 2 % solution Use as directed 15 mLs in the mouth or throat as needed for mouth pain. 03/15/18   Steele Sizer, MD  triamcinolone cream (KENALOG) 0.1 % triamcinolone acetonide 0.1 % topical cream    [provider]   Allergies  Allergen Reactions   Lipitor [Atorvastatin] Hives and Itching   Other    Viagra  [Sildenafil Citrate]     vision loss    FAMILY HISTORY:  family history includes Cancer in his father and maternal grandfather; Diabetes in his father, mother, and paternal grandfather; Hypertension in his father and mother; Lupus in his daughter; Prostate cancer in his maternal uncle; Seizures in his  daughter. SOCIAL HISTORY:  reports that he quit smoking about 13 years ago. His smoking use included cigarettes. He started smoking about 23 years ago. He has a 10.00 pack-year smoking history. He has never used smokeless tobacco. He reports current alcohol use of about 10.0 standard drinks of alcohol per week. He reports that he does not use drugs.   REVIEW OF SYSTEMS: Positives in [place] Constitutional: Negative for fever, chills, weight loss, malaise/fatigue and diaphoresis.  HENT: Negative for hearing loss, ear pain, nosebleeds, congestion, sore throat, neck pain, tinnitus and ear discharge.   Eyes: Negative for blurred vision, double vision, photophobia, pain, discharge and redness.  Respiratory: Negative for cough, hemoptysis, sputum production, shortness of breath, wheezing and stridor.   Cardiovascular: Negative for chest pain, palpitations, orthopnea, claudication, leg swelling and PND.  Gastrointestinal: Negative for heartburn, nausea, vomiting, abdominal pain, diarrhea, constipation, blood in stool and melena.  Genitourinary: Negative for dysuria, urgency, frequency, hematuria and flank pain.  Musculoskeletal: Negative for +myalgias, back pain, +joint pain and falls.  Skin: Negative for itching and rash.  Neurological: Negative for dizziness, tingling, tremors, sensory change, speech change, focal weakness, seizures, loss of consciousness, weakness and headaches.  Endo/Heme/Allergies: Negative for environmental allergies and polydipsia. Does not bruise/bleed easily.  SUBJECTIVE:  Patient denies shortness of breath, cough, chest pain, edema, fever/chills,  lightheadedness, or palpitations, or abdominal pain Reports pain in bilateral knees On room air, currently requiring no vasopressors  VITAL SIGNS: Temp:  [98.2 F (36.8 C)-102.7 F (39.3 C)] 98.2 F (36.8 C) (05/27 0000) Pulse Rate:  [37-115] 114 (05/27 0000) Resp:  [22-41] 24 (05/27 0000) BP: (79-147)/(37-107) 97/61 (05/27  0000) SpO2:  [92 %-100 %] 98 % (05/27 0000) Weight:  [145.2 kg] 145.2 kg (05/26 0935)  PHYSICAL EXAMINATION: General: Acutely ill-appearing male, laying in bed, on room air, in no acute distress Neuro: Awake, alert and oriented, follows commands, speech clear, no focal deficits HEENT: Atraumatic, normocephalic, neck supple, no JVD, pupils PERRLA Cardiovascular: Tachycardia, regular rhythm, S1-S2, no murmurs rubs or gallops Lungs: Clear to auscultation bilaterally, even, nonlabored, normal effort Abdomen: Obese, soft, nontender, nondistended, no guarding or rebound tenderness, bowel sounds positive x4 Musculoskeletal: Bilateral knee replacement surgeries with staples in place, dry, intact.  Swelling noted around the knees, 2+ edema bilaterally lower extremities Skin: Warm and dry, no obvious rashes lesions or ulcerations  Recent Labs  Lab 09/12/18 0955  NA 126*  K 4.4  CL 90*  CO2 17*  BUN 28*  CREATININE 3.71*  GLUCOSE 129*   Recent Labs  Lab 09/12/18 0955  HGB 11.5*  HCT 35.7*  WBC 16.1*  PLT 137*   Dg Chest Port 1 View  Result Date: 09/12/2018 CLINICAL DATA:  58 year old male with a history of increasing dizziness EXAM: PORTABLE CHEST 1 VIEW COMPARISON:  11/09/2006 FINDINGS: Cardiomediastinal silhouette unchanged in size and contour. No pneumothorax or pleural effusion. Low lung volumes with coarsened interstitial markings. Linear opacity at the left lung base with no confluent airspace disease. IMPRESSION: Low lung volumes without evidence of acute cardiopulmonary disease. Electronically Signed   By: Corrie Mckusick D.O.   On: 09/12/2018 10:19   Dg Knee Complete 4 Views Left  Result Date: 09/12/2018 CLINICAL DATA:  58 year old male with a history of knee replacement EXAM: LEFT KNEE - COMPLETE 4+ VIEW COMPARISON:  None. FINDINGS: No acute displaced fracture. Surgical changes of arthroplasty left knee. Joint effusion. Surgical staples project within the soft tissues of the  midline. Soft tissue swelling at the surgical site. IMPRESSION: Early surgical changes of left knee arthroplasty, with soft tissue swelling and joint effusion. Electronically Signed   By: Corrie Mckusick D.O.   On: 09/12/2018 10:15    ASSESSMENT / PLAN:  Sepsis of unknown source (suspect secondary to ? UTI) -Chest x-ray without signs of pneumonia -Bilateral knee surgical sites without signs of infection -Monitor fever curve -Trend WBCs and Procalcitonin -Follow blood and urine cultures as above -Continue cefepime and vancomycin for now -Received fluid resuscitation in ED (30 cc/kg) -Continue maintenance IV Fluids -Maintain MAP >65 -Neosynephrine if needed to maintain MAP goal  Hx of Hypertension -Cardiac monitoring -Holding home Lisinopril  AKI Anion gap metabolic acidosis in the setting of lactic acidosis Hyponatremia -Monitor I&O's / urinary output -Follow BMP -Ensure adequate renal perfusion -Avoid nephrotoxic agents as able -Replace electrolytes as indicated -Nephrology following, appreciate input -IV fluids -Bicarb drip -Trend lactic acid -Renal Ultrasound pending  Bilateral knee replacement surgery -X-ray showing soft tissue swelling, otherwise no concern for infection at this time -Orthopedics following, appreciate input  Anemia without signs and symptoms of bleeding Thrombocytopenia, likely in setting of sepsis -Monitor for S/Sx of bleeding -Trend CBC -Heparin SQ for VTE Prophylaxis  -Transfuse for Hgb <7 -Transfuse platelets for Platelet count < 50 and active bleeding  Diabetes mellitus -CBGs -SSI -Follow ICU hypo-hyperglycemia  protocol -Hold home metformin given renal failure       Disposition: Stepdown Goals of care: Full code VTE prophylaxis: Heparin SQ Updates: Updated patient at bedside 09/13/2018   Darel Hong, [facility] [place] [facility] Pager: [phone] Cell: [phone]  09/13/2018, 12:14 AM

## 2018-09-13 NOTE — Consult Note (Addendum)
NAME: Isaac Cross  DOB: 1960/10/22  MRN: [id number]  Date/Time: 09/13/2018 12:39 PM  REQUESTING PROVIDER:aleskerov Subjective:  REASON FOR CONSULT: Joint infection ? Isaac Cross is a 58 y.o. male male with a history of COPD, diabetes mellitus, hypertension, GERD, recent bilateral knee replacement on Sep 01, 2018 at [facility] by Dr. Harlow Mares presents to the ED with dizziness of 2 days duration, fever of more than 5 days  duration.   In the ED on 09/12/2018 his temperature was 102.7, heart rate of 110 and BP of 147/101.  WBC was 16.1, hemoglobin of 11.5, platelet of 137, creatinine of 3.71 and lactate of 4.9 and pro calcitonin of 6.97.  Blood cultures were sent.  He was started on vancomycin and cefepime. Patient has been admitted to the ICU . He was also seen by r nephrologist and medication with metformin, lisinopril and trimethoprim sulfamethoxazole was on hold.  He was also started on IV fluids with sodium bicarbonate infusion. As per his wife pt had surgery, on 5/15 and came home on 5/17  He apparently was not urinating well and had increased frequency and his PCP did a virtual visit questioned possible UTI due to recent Foley placement and gave him Flomax .He felt better after that. and started PT on 5/19 .and was doing well for a few days- he then started sweating and a had a fever and wife called 911 and they came and assessed him and did not feel he needed hospitalization. On Saturday 09/09/2018 the wife called [facility] as his temp was 102-103 and the on-call physician Dr. Sabra Heck prescribed Bactrim and Keflex .  He became progressively weak and fatigued.  And on Tuesday morning he was found to have rolled off his bed and kind of kneeling on his left knee and his wife with the help of her son and daughter brought the patient to the car and brought him to the emergency room on 09/12/2018.  As per his wife he was also constipated for a few days and stopped taking the Percocet and then had a bowel  movement before his hospitalization. Patient denies cough, shortness of breath or chest pain.  He says his legs have always been swollen.  He does have more pain in his left knee. Past Medical History:  Diagnosis Date   Allergy    Anxiety    Arthritis    knees   Asthma, mild intermittent, well-controlled    Chronic obstructive asthma (HCC)    COPD (chronic obstructive pulmonary disease) (HCC)    Depression with anxiety    Diabetes (HCC)    GERD (gastroesophageal reflux disease)    HBP (high blood pressure)    High cholesterol    Renal cyst, right    Sleep apnea    CPAP   Swelling     Past Surgical History:  Procedure Laterality Date   BLADDER REPAIR     COLONOSCOPY WITH PROPOFOL N/A 04/07/2015   Procedure: COLONOSCOPY WITH PROPOFOL;  Surgeon: Lucilla Lame, MD;  Location: [facility];  Service: Endoscopy;  Laterality: N/A;  Diabetic - oral meds CPAP   HAND SURGERY     4TH AND 5TH FINGER   KNEE ARTHROSCOPY Right    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   VEIN SURGERY Right   Social history Patient is a case Freight forwarder As per his wife he currently smokes. He has a dog at home   Family History  Problem Relation Age of Onset   Hypertension Mother  Diabetes Mother    Diabetes Father    Hypertension Father    Cancer Father    Seizures Daughter    Lupus Daughter    Cancer Maternal Grandfather        Prostate   Diabetes Paternal Grandfather    Prostate cancer Maternal Uncle    Bladder Cancer Neg Hx    Kidney cancer Neg Hx    Allergies  Allergen Reactions   Lipitor [Atorvastatin] Hives and Itching   Other    Viagra  [Sildenafil Citrate]     vision loss    ? Current Facility-Administered Medications  Medication Dose Route Frequency Provider Last Rate Last Dose   acetaminophen (TYLENOL) tablet 650 mg  650 mg Oral Q6H PRN Gladstone Lighter, MD   650 mg at 09/13/18 1610   Or   acetaminophen (TYLENOL)  suppository 650 mg  650 mg Rectal Q6H PRN Gladstone Lighter, MD       albuterol (PROVENTIL) (2.5 MG/3ML) 0.083% nebulizer solution 2.5 mg  2.5 mg Nebulization Q6H Gladstone Lighter, MD   2.5 mg at 09/13/18 0818   aspirin EC tablet 81 mg  81 mg Oral Daily Gladstone Lighter, MD   81 mg at 09/13/18 0923   atenolol (TENORMIN) tablet 25 mg  25 mg Oral QPM Gladstone Lighter, MD       ceFEPIme (MAXIPIME) 2 g in sodium chloride 0.9 % 100 mL IVPB  2 g Intravenous Q12H Rocky Morel, RPH 200 mL/hr at 09/13/18 0922 2 g at 09/13/18 9604   citalopram (CELEXA) tablet 20 mg  20 mg Oral Daily Gladstone Lighter, MD   20 mg at 09/13/18 5409   docusate sodium (COLACE) capsule 100 mg  100 mg Oral BID Gladstone Lighter, MD   100 mg at 09/13/18 0923   fluticasone furoate-vilanterol (BREO ELLIPTA) 100-25 MCG/INH 1 puff  1 puff Inhalation Daily Gladstone Lighter, MD   1 puff at 09/13/18 0926   heparin injection 5,000 Units  5,000 Units Subcutaneous Q8H Gladstone Lighter, MD   5,000 Units at 09/13/18 0540   HYDROcodone-acetaminophen (NORCO/VICODIN) 5-325 MG per tablet 1-2 tablet  1-2 tablet Oral Q4H PRN Gladstone Lighter, MD   1 tablet at 09/12/18 2247   HYDROcodone-acetaminophen (NORCO/VICODIN) 5-325 MG per tablet 2 tablet  2 tablet Oral Q4H PRN Ottie Glazier, MD   2 tablet at 09/13/18 0540   insulin aspart (novoLOG) injection 0-5 Units  0-5 Units Subcutaneous QHS Gladstone Lighter, MD       insulin aspart (novoLOG) injection 0-9 Units  0-9 Units Subcutaneous TID WC Gladstone Lighter, MD   2 Units at 09/13/18 1220   loratadine (CLARITIN) tablet 10 mg  10 mg Oral Daily Gladstone Lighter, MD   10 mg at 09/13/18 0923   ondansetron (ZOFRAN) tablet 4 mg  4 mg Oral Q6H PRN Gladstone Lighter, MD       Or   ondansetron [facility]) injection 4 mg  4 mg Intravenous Q6H PRN Gladstone Lighter, MD       phenylephrine (NEO-SYNEPHRINE) 10 mg in sodium chloride 0.9 % 250 mL (0.04 mg/mL) infusion  0-400  mcg/min Intravenous Titrated Darel Hong D, NP       rosuvastatin (CRESTOR) tablet 20 mg  20 mg Oral Daily Gladstone Lighter, MD   20 mg at 09/13/18 0924   sodium bicarbonate 150 mEq in dextrose 5 % 1,000 mL infusion   Intravenous Continuous Darel Hong D, NP 125 mL/hr at 09/13/18 0605     tamsulosin (FLOMAX) capsule 0.4 mg  0.4 mg Oral Daily Gladstone Lighter, MD   0.4 mg at 09/13/18 0454   vancomycin (VANCOCIN) IVPB 1000 mg/200 mL premix  1,000 mg Intravenous Q24H Gladstone Lighter, MD         Abtx:  Anti-infectives (From admission, onward)   Start     Dose/Rate Route Frequency Ordered Stop   09/13/18 1300  vancomycin (VANCOCIN) IVPB 1000 mg/200 mL premix     1,000 mg 200 mL/hr over 60 Minutes Intravenous Every 24 hours 09/12/18 1423     09/12/18 2200  ceFEPIme (MAXIPIME) 2 g in sodium chloride 0.9 % 100 mL IVPB     2 g 200 mL/hr over 30 Minutes Intravenous Every 12 hours 09/12/18 1555     09/12/18 1030  vancomycin (VANCOCIN) 2,000 mg in sodium chloride 0.9 % 500 mL IVPB     2,000 mg 250 mL/hr over 120 Minutes Intravenous  Once 09/12/18 0947 09/12/18 1423   09/12/18 1000  cefTRIAXone (ROCEPHIN) 2 g in sodium chloride 0.9 % 100 mL IVPB     2 g 200 mL/hr over 30 Minutes Intravenous  Once 09/12/18 0947 09/12/18 1150   09/12/18 1000  ciprofloxacin (CIPRO) IVPB 400 mg     400 mg 200 mL/hr over 60 Minutes Intravenous  Once 09/12/18 0947 09/12/18 1227      REVIEW OF SYSTEMS:  Const: Fever, chills, increased sweating, negative weight loss Eyes: negative diplopia or visual changes, negative eye pain ENT: negative coryza, negative sore throat Resp: negative cough, hemoptysis, dyspnea Cards: negative for chest pain, palpitations,  positive for lower extremity edema GU: As above GI: Had constipation and abdominal pain which is cleared now. Skin: negative for rash and pruritus Heme: Bruising legs MS: Generalized weakness, knee pain bilateral Neurolo:negative for headaches,  positive dizziness, vertigo, memory problems  Psych: negative for feelings of anxiety, depression  Endocrine: Known diabetes Allergy/Immunology-as noted above Objective:  VITALS:  BP (!) 137/58    Pulse (!) 116    Temp 99.6 F (37.6 C)    Resp (!) 27    Ht 6\' 4"  (1.93 m)    Wt (!) 145.2 kg    SpO2 98%    BMI 38.95 kg/m  PHYSICAL EXAM:  General: Awake, cooperative, confused at times, slow to respond, oriented in place person year.  Responds appropriately Head: Normocephalic, without obvious abnormality, atraumatic. Eyes: Conjunctivae clear, anicteric sclerae. Pupils are equal ENT Nares normal. No drainage or sinus tenderness. Tongue coated. No Thrush Neck: Supple, symmetrical, no adenopathy, thyroid: non tender no carotid bruit and no JVD. Back: No CVA tenderness. Lungs bilateral air entry Heart: Tachycardia S1-S2 Abdomen: Soft, non-tender,not distended. Bowel sounds normal. No masses, Foley in place Extremities: Edema legs Bilateral knee swelling, warm to touch, but no erythema, Movt -rt knee > left Bilateral knee surgical site is clean with no discharge or erythema.  Staples present.  Bruising of both the legs left much more than the right       Skin: No rashes or lesions. Or bruising Lymph: Cervical, supraclavicular normal. Neurologic: Grossly non-focal Pertinent Labs Lab Results CBC CBC Latest Ref Rng & Units 09/13/2018 09/12/2018 06/12/2018  WBC 4.0 - 10.5 K/uL 9.4 16.1(H) 5.9  Hemoglobin 13.0 - 17.0 g/dL 8.7(L) 11.5(L) 13.5  Hematocrit 39.0 - 52.0 % 26.7(L) 35.7(L) 39.9  Platelets 150 - 400 K/uL 137(L) 137(L) 269      Component Value Date/Time   WBC 9.4 09/13/2018 0344   RBC 3.26 (L) 09/13/2018 0344   HGB 8.7 (L) 09/13/2018  0344   HCT 26.7 (L) 09/13/2018 0344   PLT 137 (L) 09/13/2018 0344   MCV 81.9 09/13/2018 0344   MCH 26.7 09/13/2018 0344   MCHC 32.6 09/13/2018 0344   RDW 14.6 09/13/2018 0344   LYMPHSABS 3.4 09/12/2018 0955   MONOABS 1.5 (H) 09/12/2018 0955     EOSABS 0.3 09/12/2018 0955   BASOSABS 0.1 09/12/2018 0955    CMP Latest Ref Rng & Units 09/13/2018 09/12/2018 06/12/2018  Glucose 70 - 99 mg/dL 196(H) 129(H) 109(H)  BUN 6 - 20 mg/dL 34(H) 28(H) 12  Creatinine 0.61 - 1.24 mg/dL 4.31(H) 3.71(H) 1.07  Sodium 135 - 145 mmol/L 126(L) 126(L) 138  Potassium 3.5 - 5.1 mmol/L 4.1 4.4 4.1  Chloride 98 - 111 mmol/L 96(L) 90(L) 102  CO2 22 - 32 mmol/L 18(L) 17(L) 27  Calcium 8.9 - 10.3 mg/dL 7.9(L) 9.7 9.5  Total Protein 6.5 - 8.1 g/dL - 7.6 6.8  Total Bilirubin 0.3 - 1.2 mg/dL - 0.7 0.4  Alkaline Phos 38 - 126 U/L - 86 -  AST 15 - 41 U/L - 70(H) 28  ALT 0 - 44 U/L - 42 23      Microbiology: Recent Results (from the past 240 hour(s))  Blood Culture (routine x 2)     Status: None (Preliminary result)   Collection Time: 09/12/18  9:56 AM  Result Value Ref Range Status   Specimen Description BLOOD LEFT HAND  Final   Special Requests   Final    BOTTLES DRAWN AEROBIC AND ANAEROBIC Blood Culture adequate volume   Culture   Final    NO GROWTH < 24 HOURS Performed at [facility], [address], [place], [place] [address]    Report Status PENDING  Incomplete  Urine culture     Status: None   Collection Time: 09/12/18 10:01 AM  Result Value Ref Range Status   Specimen Description   Final    URINE, RANDOM Performed at [facility], [address], [place], [place] [address]    Special Requests   Final    NONE Performed at [facility], [address] [place], [place] [address]    Culture   Final    NO GROWTH Performed at [facility], [place] [address], [place],  [address]    Report Status 09/13/2018 FINAL  Final  SARS Coronavirus 2 (CEPHEID- Performed in [facility] hospital lab), Hosp Order     Status: None   Collection Time: 09/12/18 10:02 AM  Result Value Ref Range Status   SARS Coronavirus 2 NEGATIVE NEGATIVE Final    Comment: (NOTE) If result is NEGATIVE SARS-CoV-2 target nucleic  acids are NOT DETECTED. The SARS-CoV-2 RNA is generally detectable in upper and lower  respiratory specimens during the acute phase of infection. The lowest  concentration of SARS-CoV-2 viral copies this assay can detect is 250  copies / mL. A negative result does not preclude SARS-CoV-2 infection  and should not be used as the sole basis for treatment or other  patient management decisions.  A negative result may occur with  improper specimen collection / handling, submission of specimen other  than nasopharyngeal swab, presence of viral mutation(s) within the  areas targeted by this assay, and inadequate number of viral copies  (<250 copies / mL). A negative result must be combined with clinical  observations, patient history, and epidemiological information. If result is POSITIVE SARS-CoV-2 target nucleic acids are DETECTED. The SARS-CoV-2 RNA is generally detectable  in upper and lower  respiratory specimens dur ing the acute phase of infection.  Positive  results are indicative of active infection with SARS-CoV-2.  Clinical  correlation with patient history and other diagnostic information is  necessary to determine patient infection status.  Positive results do  not rule out bacterial infection or co-infection with other viruses. If result is PRESUMPTIVE POSTIVE SARS-CoV-2 nucleic acids MAY BE PRESENT.   A presumptive positive result was obtained on the submitted specimen  and confirmed on repeat testing.  While 2019 novel coronavirus  (SARS-CoV-2) nucleic acids may be present in the submitted sample  additional confirmatory testing may be necessary for epidemiological  and / or clinical management purposes  to differentiate between  SARS-CoV-2 and other Sarbecovirus currently known to infect humans.  If clinically indicated additional testing with an alternate test  methodology [phone]) is advised. The SARS-CoV-2 RNA is generally  detectable in upper and lower respiratory  sp ecimens during the acute  phase of infection. The expected result is Negative. Fact Sheet for Patients:  StrictlyIdeas.no Fact Sheet for Healthcare Providers: BankingDealers.co.za This test is not yet approved or cleared by the [place] FDA and has been authorized for detection and/or diagnosis of SARS-CoV-2 by FDA under an Emergency Use Authorization (EUA).  This EUA will remain in effect (meaning this test can be used) for the duration of the COVID-19 declaration under Section 564(b)(1) of the Act, 21 U.S.C. section 360bbb-3(b)(1), unless the authorization is terminated or revoked sooner. Performed at [facility], [address] [place], [place] [address]   Blood Culture (routine x 2)     Status: None (Preliminary result)   Collection Time: 09/12/18 10:06 AM  Result Value Ref Range Status   Specimen Description BLOOD LEFT ANTECUBITAL  Final   Special Requests   Final    BOTTLES DRAWN AEROBIC AND ANAEROBIC Blood Culture adequate volume   Culture   Final    NO GROWTH < 24 HOURS Performed at [facility], [address], [place], [place] [address]    Report Status PENDING  Incomplete  MRSA PCR Screening     Status: None   Collection Time: 09/12/18  7:04 PM  Result Value Ref Range Status   MRSA by PCR NEGATIVE NEGATIVE Final    Comment:        The GeneXpert MRSA Assay (FDA approved for NASAL specimens only), is one component of a comprehensive MRSA colonization surveillance program. It is not intended to diagnose MRSA infection nor to guide or monitor treatment for MRSA infections. Performed at [facility], [address] [place], [place] [address]     IMAGING  Low lung volumes without evidence of acute cardiopulmonary disease. RESULTS: Early surgical changes of left knee arthroplasty, with soft tissue swelling and joint effusion. I have personally reviewed the  films ? Impression/Recommendation 58 y.o. male male with a history of COPD, diabetes mellitus, hypertension, GERD, recent bilateral knee replacement on Sep 01, 2018 at [facility] by Dr. Harlow Mares presents to the ED with dizziness of 2 days duration, fever of more than 5 days  duration.   In the ED on 09/12/2018 his temperature was 102.7, heart rate of 110 and BP of 147/101.  WBC was 16.1, hemoglobin of 11.5, platelet of 137, creatinine of 3.71 and lactate of 4.9 and pro calcitonin of 6.97.  Blood cultures were sent.  He was started on vancomycin and cefepime. Patient has been admitted to the ICU . He was also seen  by r nephrologist and medication with metformin, lisinopril and trimethoprim sulfamethoxazole was on hold.  He was also started on IV fluids with sodium bicarbonate infusion. As per his wife pt had surgery, on 5/15 and came home on 5/17  He apparently was not urinating well and had increased frequency and his PCP did a virtual visit questioned possible UTI due to recent Foley placement and gave him Flomax .He felt better after that. and started PT on 5/19 .and was doing well for a few days- he then started sweating and a had a fever and wife called 911 and they came and assessed him and did not feel he needed hospitalization. On Saturday 09/09/2018 the wife called [facility] as his temp was 102-103 and the on-call physician Dr. Sabra Heck prescribed Bactrim and Keflex .  He became progressively weak and fatigued.  And on Tuesday morning he was found to have rolled off his bed and kind of kneeling on his left knee and his wife with the help of her son and daughter brought the patient to the car and brought him to the emergency room on 09/12/2018.  As per his wife he was also constipated for a few days and stopped taking the Percocet and then had a bowel movement before his hospitalization. Patient denies cough, shortness of breath or chest pain.  He says his legs have always been swollen.  He does have more pain  in his left knee.   Fever with features of sepsis in a patient who recently had bilateral knee replacement.  The differential diagnosis for the fever is urinary tract versus knee joint.  As he had frequency of micturition , and UA shows 6-10 WBC UTI is likely.  As he was on Bactrim and Keflex the urine culture may be negative.  He is currently on Vanco and cefepime.  But what is concerning is in spite of the IV antibiotics he has had fever 24 hours after hospitalization.  If this was a UTI and Bactrim had made the urine sterile then cefepime should definitely work on this patient.  So prosthetic knee joint infection will have to be considered.  The surgical site looks okay with no evidence of superficial infection.  If he continues to have fever the knee joint will have to be aspirated and sent for cell count, crystals and culture.  AKI could be multifactorial in etiology.  Infection versus urinary retention versus medication.  He has not taken any NSAID .  Seen by nephrologist.  Metformin lisinopril and Bactrim have been stopped.  ?We will order an ultrasound of the kidneys to rule out hydronephrosis.  We will also check CPK.  Bilateral knee joint replacement on 09/01/2018.  The knees are swollen.  Tenderness on movement especially the left knee.  This is could be a normal postop finding or he could have hematoma secondary to Xarelto.  If the fever persists may need to aspirate the knee.    Diabetes mellitus was on metformin.  Now on insulin.  Discussed the management with his nurse and his wife in great detail   Note:  This document was prepared using Dragon voice recognition software and may include unintentional dictation errors.

## 2018-09-13 NOTE — Progress Notes (Signed)
PT Cancellation Note  Patient Details Name: RYAAN VANWAGONER MRN: [id number] DOB: Sep 20, 1960   Cancelled Treatment:    Reason Eval/Treat Not Completed: Other (comment): Spoke to patient's nurse who requested no PT services this date secondary to various medical concerns including elevated resting HR.  Will follow patient and attempt to see pt at a future date/time as medically appropriate.     Linus Salmons PT, DPT 09/13/18, 1:34 PM

## 2018-09-13 NOTE — Progress Notes (Signed)
Reddened, hard area noted to posterior right leg, calf area. Pt states non tender. Provider Marda Stalker, NP in to evaluate.

## 2018-09-13 NOTE — Progress Notes (Signed)
Pt currently alert and oriented following commands.  I spoke with pts wife Korde Jeppsen via telephone regarding plan of care and all questions were answered.  Will continue to monitor and assess pt.  Marda Stalker, [place] Pager [phone] (please enter 7 digits) PCCM Consult Pager [phone] (please enter 7 digits)

## 2018-09-13 NOTE — Progress Notes (Signed)
Subjective: Isaac Cross is a 58 y.o. 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 Please see H&P and ED notes for details. Denies any numbness or tingling. Patient reports pain as mild.  Dr. Harlow Mares who preformed surgery consulted 09/12/2018.    Objective:   VITALS:   Vitals:   09/13/18 0221 09/13/18 0300 09/13/18 0400 09/13/18 0600  BP:   (!) 92/59 102/72  Pulse:  (!) 117 (!) 122 (!) 120  Resp:  (!) 23 (!) 25 17  Temp:  (!) 100.5 F (38.1 C)    TempSrc:  Oral    SpO2: 97% 98% 97% 100%  Weight:      Height:        PHYSICAL EXAM:  Neurologically intact ABD soft Neurovascular intact Sensation intact distally Intact pulses distally Dorsiflexion/Plantar flexion intact No cellulitis present Compartment soft  LABS  Results for orders placed or performed during the hospital encounter of 09/12/18 (from the past 24 hour(s))  Lactic acid, plasma     Status: Abnormal   Collection Time: 09/12/18  9:55 AM  Result Value Ref Range   Lactic Acid, Venous 4.9 (HH) 0.5 - 1.9 mmol/L  Comprehensive metabolic panel     Status: Abnormal   Collection Time: 09/12/18  9:55 AM  Result Value Ref Range   Sodium 126 (L) 135 - 145 mmol/L   Potassium 4.4 3.5 - 5.1 mmol/L   Chloride 90 (L) 98 - 111 mmol/L   CO2 17 (L) 22 - 32 mmol/L   Glucose, Bld 129 (H) 70 - 99 mg/dL   BUN 28 (H) 6 - 20 mg/dL   Creatinine, Ser 3.71 (H) 0.61 - 1.24 mg/dL   Calcium 9.7 8.9 - 10.3 mg/dL   Total Protein 7.6 6.5 - 8.1 g/dL   Albumin 3.0 (L) 3.5 - 5.0 g/dL   AST 70 (H) 15 - 41 U/L   ALT 42 0 - 44 U/L   Alkaline Phosphatase 86 38 - 126 U/L   Total Bilirubin 0.7 0.3 - 1.2 mg/dL   GFR calc non Af Amer 17 (L) >60 mL/min   GFR calc Af Amer 19 (L) >60 mL/min   Anion gap 19 (H) 5 - 15  Lipase, blood     Status: Abnormal   Collection Time: 09/12/18  9:55 AM  Result Value Ref Range   Lipase 74 (H) 11 - 51 U/L  CBC WITH DIFFERENTIAL      Status: Abnormal   Collection Time: 09/12/18  9:55 AM  Result Value Ref Range   WBC 16.1 (H) 4.0 - 10.5 K/uL   RBC 4.33 4.22 - 5.81 MIL/uL   Hemoglobin 11.5 (L) 13.0 - 17.0 g/dL   HCT 35.7 (L) 39.0 - 52.0 %   MCV 82.4 80.0 - 100.0 fL   MCH 26.6 26.0 - 34.0 pg   MCHC 32.2 30.0 - 36.0 g/dL   RDW 14.3 11.5 - 15.5 %   Platelets 137 (L) 150 - 400 K/uL   nRBC 0.2 0.0 - 0.2 %   Neutrophils Relative % 64 %   Neutro Abs 10.3 (H) 1.7 - 7.7 K/uL   Lymphocytes Relative 21 %   Lymphs Abs 3.4 0.7 - 4.0 K/uL   Monocytes Relative 9 %   Monocytes Absolute 1.5 (H) 0.1 - 1.0 K/uL   Eosinophils Relative 2 %   Eosinophils Absolute 0.3 0.0 - 0.5 K/uL   Basophils Relative 1 %   Basophils Absolute  0.1 0.0 - 0.1 K/uL   Immature Granulocytes 3 %   Abs Immature Granulocytes 0.48 (H) 0.00 - 0.07 K/uL  Procalcitonin     Status: None   Collection Time: 09/12/18  9:55 AM  Result Value Ref Range   Procalcitonin 6.97 ng/mL  APTT     Status: None   Collection Time: 09/12/18  9:55 AM  Result Value Ref Range   aPTT 33 24 - 36 seconds  Protime-INR     Status: Abnormal   Collection Time: 09/12/18  9:55 AM  Result Value Ref Range   Prothrombin Time 15.3 (H) 11.4 - 15.2 seconds   INR 1.2 0.8 - 1.2  Hemoglobin A1c     Status: Abnormal   Collection Time: 09/12/18  9:55 AM  Result Value Ref Range   Hgb A1c MFr Bld 6.9 (H) 4.8 - 5.6 %   Mean Plasma Glucose 151.33 mg/dL  Sedimentation rate     Status: Abnormal   Collection Time: 09/12/18  9:56 AM  Result Value Ref Range   Sed Rate 64 (H) 0 - 20 mm/hr  Urinalysis, Complete w Microscopic     Status: Abnormal   Collection Time: 09/12/18 10:01 AM  Result Value Ref Range   Color, Urine AMBER (A) YELLOW   APPearance TURBID (A) CLEAR   Specific Gravity, Urine 1.026 1.005 - 1.030   pH 5.0 5.0 - 8.0   Glucose, UA NEGATIVE NEGATIVE mg/dL   Hgb urine dipstick NEGATIVE NEGATIVE   Bilirubin Urine NEGATIVE NEGATIVE   Ketones, ur 5 (A) NEGATIVE mg/dL   Protein, ur  100 (A) NEGATIVE mg/dL   Nitrite NEGATIVE NEGATIVE   Leukocytes,Ua NEGATIVE NEGATIVE   RBC / HPF 0-5 0 - 5 RBC/hpf   WBC, UA 6-10 0 - 5 WBC/hpf   Bacteria, UA NONE SEEN NONE SEEN   Squamous Epithelial / LPF 0-5 0 - 5   Mucus PRESENT    Amorphous Crystal PRESENT   SARS Coronavirus 2 (CEPHEID- Performed in [facility] lab), Hosp Order     Status: None   Collection Time: 09/12/18 10:02 AM  Result Value Ref Range   SARS Coronavirus 2 NEGATIVE NEGATIVE  Lactic acid, plasma     Status: Abnormal   Collection Time: 09/12/18  1:01 PM  Result Value Ref Range   Lactic Acid, Venous 2.0 (HH) 0.5 - 1.9 mmol/L  Lactic acid, plasma     Status: None   Collection Time: 09/12/18  6:23 PM  Result Value Ref Range   Lactic Acid, Venous 1.4 0.5 - 1.9 mmol/L  MRSA PCR Screening     Status: None   Collection Time: 09/12/18  7:04 PM  Result Value Ref Range   MRSA by PCR NEGATIVE NEGATIVE  Glucose, capillary     Status: Abnormal   Collection Time: 09/12/18  7:48 PM  Result Value Ref Range   Glucose-Capillary 167 (H) 70 - 99 mg/dL   Comment 1 Notify RN   Lactic acid, plasma     Status: None   Collection Time: 09/12/18 10:34 PM  Result Value Ref Range   Lactic Acid, Venous 1.2 0.5 - 1.9 mmol/L  Glucose, capillary     Status: Abnormal   Collection Time: 09/12/18 11:35 PM  Result Value Ref Range   Glucose-Capillary 218 (H) 70 - 99 mg/dL   Comment 1 Notify RN   Basic metabolic panel     Status: Abnormal   Collection Time: 09/13/18  3:44 AM  Result Value Ref Range  Sodium 126 (L) 135 - 145 mmol/L   Potassium 4.1 3.5 - 5.1 mmol/L   Chloride 96 (L) 98 - 111 mmol/L   CO2 18 (L) 22 - 32 mmol/L   Glucose, Bld 196 (H) 70 - 99 mg/dL   BUN 34 (H) 6 - 20 mg/dL   Creatinine, Ser 4.31 (H) 0.61 - 1.24 mg/dL   Calcium 7.9 (L) 8.9 - 10.3 mg/dL   GFR calc non Af Amer 14 (L) >60 mL/min   GFR calc Af Amer 16 (L) >60 mL/min   Anion gap 12 5 - 15  CBC     Status: Abnormal   Collection Time: 09/13/18   3:44 AM  Result Value Ref Range   WBC 9.4 4.0 - 10.5 K/uL   RBC 3.26 (L) 4.22 - 5.81 MIL/uL   Hemoglobin 8.7 (L) 13.0 - 17.0 g/dL   HCT 26.7 (L) 39.0 - 52.0 %   MCV 81.9 80.0 - 100.0 fL   MCH 26.7 26.0 - 34.0 pg   MCHC 32.6 30.0 - 36.0 g/dL   RDW 14.6 11.5 - 15.5 %   Platelets 137 (L) 150 - 400 K/uL   nRBC 0.2 0.0 - 0.2 %  Procalcitonin     Status: None   Collection Time: 09/13/18  3:44 AM  Result Value Ref Range   Procalcitonin 10.36 ng/mL    Dg Chest Port 1 View  Result Date: 09/12/2018 CLINICAL DATA:  58 year old male with a history of increasing dizziness EXAM: PORTABLE CHEST 1 VIEW COMPARISON:  11/09/2006 FINDINGS: Cardiomediastinal silhouette unchanged in size and contour. No pneumothorax or pleural effusion. Low lung volumes with coarsened interstitial markings. Linear opacity at the left lung base with no confluent airspace disease. IMPRESSION: Low lung volumes without evidence of acute cardiopulmonary disease. Electronically Signed   By: Corrie Mckusick D.O.   On: 09/12/2018 10:19   Dg Knee Complete 4 Views Left  Result Date: 09/12/2018 CLINICAL DATA:  58 year old male with a history of knee replacement EXAM: LEFT KNEE - COMPLETE 4+ VIEW COMPARISON:  None. FINDINGS: No acute displaced fracture. Surgical changes of arthroplasty left knee. Joint effusion. Surgical staples project within the soft tissues of the midline. Soft tissue swelling at the surgical site. IMPRESSION: Early surgical changes of left knee arthroplasty, with soft tissue swelling and joint effusion. Electronically Signed   By: Corrie Mckusick D.O.   On: 09/12/2018 10:15    Assessment/Plan:     Active Problems:   Sepsis ([place]) 11 days out from bilateral total knees  Plan: Up with therapy when medically stable Will see tomorrow and evaluate if staples should be removed     Carlynn Spry , PA-C 09/13/2018, 7:09 AM

## 2018-09-13 NOTE — Progress Notes (Signed)
Inpatient Diabetes Program Recommendations  AACE/ADA: New Consensus Statement on Inpatient Glycemic Control (2015)  Target Ranges:  Prepandial:   less than 140 mg/dL      Peak postprandial:   less than 180 mg/dL (1-2 hours)      Critically ill patients:  140 - 180 mg/dL   Lab Results  Component Value Date   GLUCAP 191 (H) 09/13/2018   HGBA1C 6.9 (H) 09/12/2018    Review of Glycemic Control Results for Isaac Cross, Isaac Cross (MRN [id number]) as of 09/13/2018 11:50  Ref. Range 09/12/2018 17:50 09/12/2018 19:48 09/12/2018 23:35 09/13/2018 07:49 09/13/2018 11:46  Glucose-Capillary Latest Ref Range: 70 - 99 mg/dL 212 (H) 167 (H) 218 (H) 199 (H) 191 (H)   Diabetes history: DM 2 Outpatient Diabetes medications: Metformin 1000 mg bid Current orders for Inpatient glycemic control:  Novolog sensitive tid with meals and HS Inpatient Diabetes Program Recommendations:    Blood sugars>goal. Please consider adding Lantus 14 units daily while in the hospital.   Thanks,  Adah Perl, RN, BC-ADM Inpatient Diabetes Coordinator Pager [phone] (8a-5p)

## 2018-09-14 ENCOUNTER — Inpatient Hospital Stay: Payer: BC Managed Care – PPO

## 2018-09-14 DIAGNOSIS — N179 Acute kidney failure, unspecified: Secondary | ICD-10-CM

## 2018-09-14 DIAGNOSIS — Z95828 Presence of other vascular implants and grafts: Secondary | ICD-10-CM

## 2018-09-14 DIAGNOSIS — R350 Frequency of micturition: Secondary | ICD-10-CM

## 2018-09-14 DIAGNOSIS — A419 Sepsis, unspecified organism: Secondary | ICD-10-CM

## 2018-09-14 LAB — BASIC METABOLIC PANEL
Anion gap: 10 (ref 5–15)
BUN: 29 mg/dL — ABNORMAL HIGH (ref 6–20)
CO2: 24 mmol/L (ref 22–32)
Calcium: 7.8 mg/dL — ABNORMAL LOW (ref 8.9–10.3)
Chloride: 92 mmol/L — ABNORMAL LOW (ref 98–111)
Creatinine, Ser: 3.5 mg/dL — ABNORMAL HIGH (ref 0.61–1.24)
GFR calc Af Amer: 21 mL/min — ABNORMAL LOW (ref >60)
GFR calc non Af Amer: 18 mL/min — ABNORMAL LOW (ref >60)
Glucose, Bld: 247 mg/dL — ABNORMAL HIGH (ref 70–99)
Potassium: 3.8 mmol/L (ref 3.5–5.1)
Sodium: 126 mmol/L — ABNORMAL LOW (ref 135–145)

## 2018-09-14 LAB — GLUCOSE, CAPILLARY
Glucose-Capillary: 247 mg/dL — ABNORMAL HIGH (ref 70–99)
Glucose-Capillary: 187 mg/dL — ABNORMAL HIGH (ref 70–99)
Glucose-Capillary: 199 mg/dL — ABNORMAL HIGH (ref 70–99)
Glucose-Capillary: 252 mg/dL — ABNORMAL HIGH (ref 70–99)

## 2018-09-14 LAB — PROTIME-INR
INR: 1.3 — ABNORMAL HIGH (ref 0.8–1.2)
Prothrombin Time: 15.7 seconds — ABNORMAL HIGH (ref 11.4–15.2)

## 2018-09-14 LAB — PROTEIN ELECTRO, RANDOM URINE
Albumin ELP, Urine: 22.6 %
Alpha-1-Globulin, U: 3.4 %
Alpha-2-Globulin, U: 22.8 %
Beta Globulin, U: 29.3 %
Gamma Globulin, U: 22 %
Total Protein, Urine: 42.6 mg/dL

## 2018-09-14 LAB — CBC WITH DIFFERENTIAL/PLATELET
Abs Immature Granulocytes: 0.08 10*3/uL — ABNORMAL HIGH (ref 0.00–0.07)
Basophils Absolute: 0.1 10*3/uL (ref 0.0–0.1)
Basophils Relative: 1 %
Eosinophils Absolute: 0.1 10*3/uL (ref 0.0–0.5)
Eosinophils Relative: 2 %
HCT: 24.5 % — ABNORMAL LOW (ref 39.0–52.0)
Hemoglobin: 8 g/dL — ABNORMAL LOW (ref 13.0–17.0)
Immature Granulocytes: 1 %
Lymphocytes Relative: 20 %
Lymphs Abs: 1.2 10*3/uL (ref 0.7–4.0)
MCH: 26.8 pg (ref 26.0–34.0)
MCHC: 32.7 g/dL (ref 30.0–36.0)
MCV: 81.9 fL (ref 80.0–100.0)
Monocytes Absolute: 1 10*3/uL (ref 0.1–1.0)
Monocytes Relative: 17 %
Neutro Abs: 3.5 10*3/uL (ref 1.7–7.7)
Neutrophils Relative %: 59 %
Platelets: 144 10*3/uL — ABNORMAL LOW (ref 150–400)
RBC: 2.99 MIL/uL — ABNORMAL LOW (ref 4.22–5.81)
RDW: 14.6 % (ref 11.5–15.5)
WBC: 5.9 10*3/uL (ref 4.0–10.5)
nRBC: 0 % (ref 0.0–0.2)

## 2018-09-14 LAB — BLOOD GAS, ARTERIAL
Acid-Base Excess: 0.7 mmol/L (ref 0.0–2.0)
Bicarbonate: 24.3 mmol/L (ref 20.0–28.0)
FIO2: 0.21
O2 Saturation: 92.8 %
Patient temperature: 37
pCO2 arterial: 35 mmHg (ref 32.0–48.0)
pH, Arterial: 7.45 (ref 7.350–7.450)
pO2, Arterial: 63 mmHg — ABNORMAL LOW (ref 83.0–108.0)

## 2018-09-14 LAB — ANA COMPREHENSIVE PANEL

## 2018-09-14 LAB — PROTEIN ELECTROPHORESIS, SERUM
A/G Ratio: 0.7 (ref 0.7–1.7)
Albumin ELP: 2.2 g/dL — ABNORMAL LOW (ref 2.9–4.4)
Alpha-1-Globulin: 0.5 g/dL — ABNORMAL HIGH (ref 0.0–0.4)
Alpha-2-Globulin: 1.1 g/dL — ABNORMAL HIGH (ref 0.4–1.0)
Beta Globulin: 0.6 g/dL — ABNORMAL LOW (ref 0.7–1.3)
Gamma Globulin: 0.9 g/dL (ref 0.4–1.8)
Globulin, Total: 3.1 g/dL (ref 2.2–3.9)
Total Protein ELP: 5.3 g/dL — ABNORMAL LOW (ref 6.0–8.5)

## 2018-09-14 LAB — C DIFFICILE QUICK SCREEN W PCR REFLEX
C Diff antigen: NEGATIVE
C Diff interpretation: NOT DETECTED
C Diff toxin: NEGATIVE

## 2018-09-14 LAB — GLOMERULAR BASEMENT MEMBRANE ANTIBODIES: GBM Ab: 4 units (ref 0–20)

## 2018-09-14 LAB — HIV ANTIBODY (ROUTINE TESTING W REFLEX): HIV Screen 4th Generation wRfx: NONREACTIVE

## 2018-09-14 LAB — PROCALCITONIN: Procalcitonin: 6.81 ng/mL

## 2018-09-14 LAB — FIBRINOGEN: Fibrinogen: 659 mg/dL — ABNORMAL HIGH (ref 210–475)

## 2018-09-14 LAB — TECHNOLOGIST SMEAR REVIEW

## 2018-09-14 IMAGING — DX PORTABLE CHEST - 1 VIEW
1 series · 2 of 2 positions shown · non-contrast
Comparison: [DATE] chest radiograph.

CLINICAL DATA: Dyspnea

EXAM:
PORTABLE CHEST 1 VIEW

[Series 1: chest ap · 0.14mm/px · 2 of 2 slices shown]
[im 1/2]
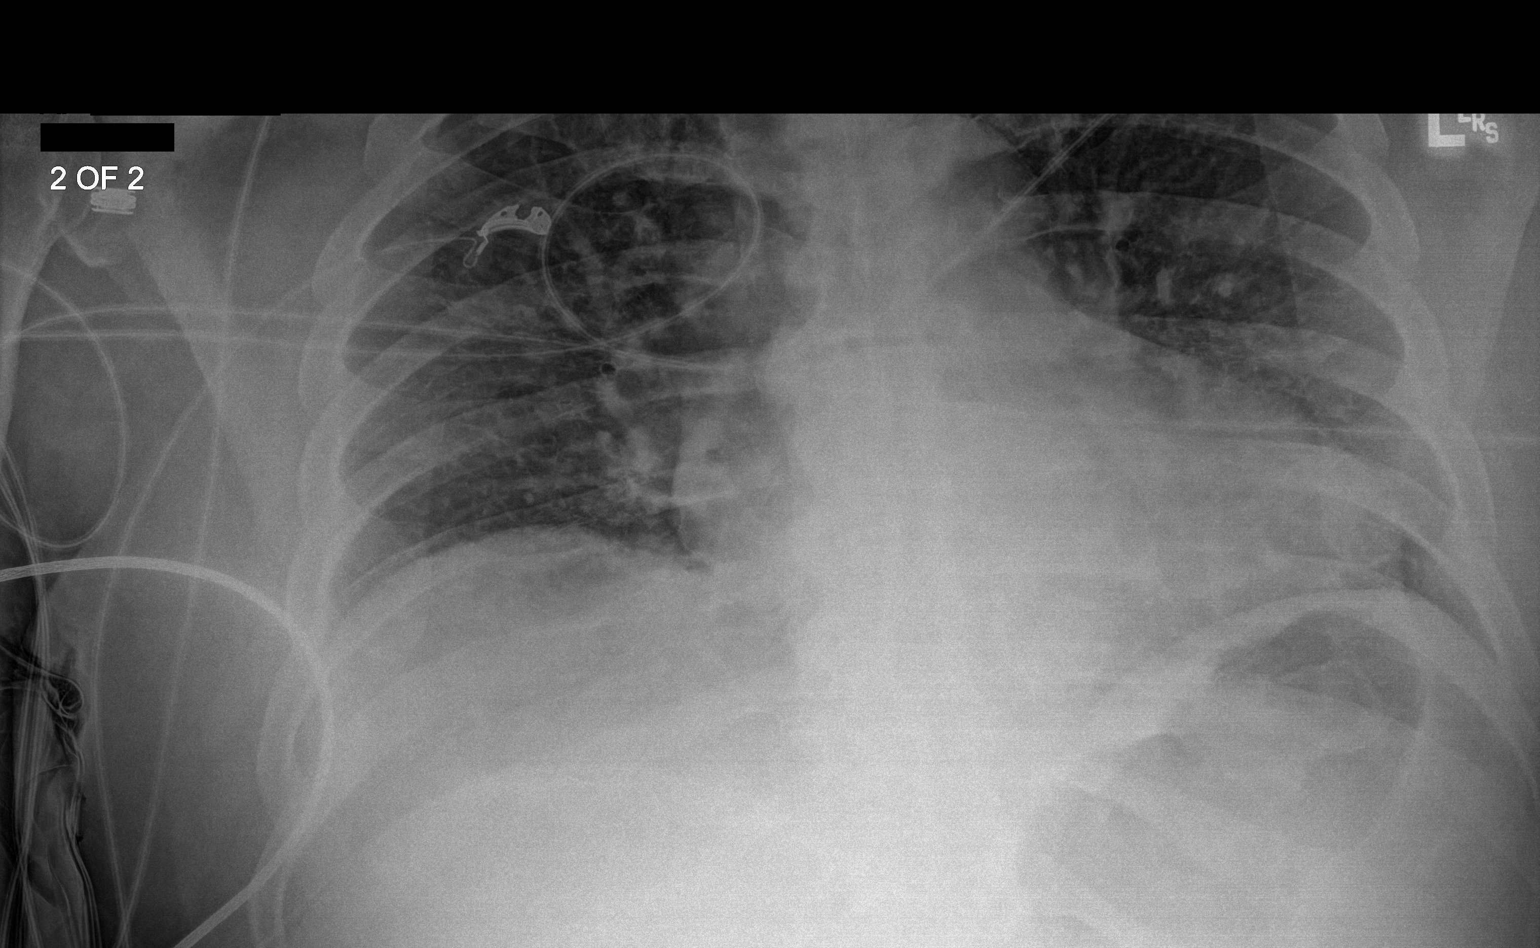
[im 2/2]
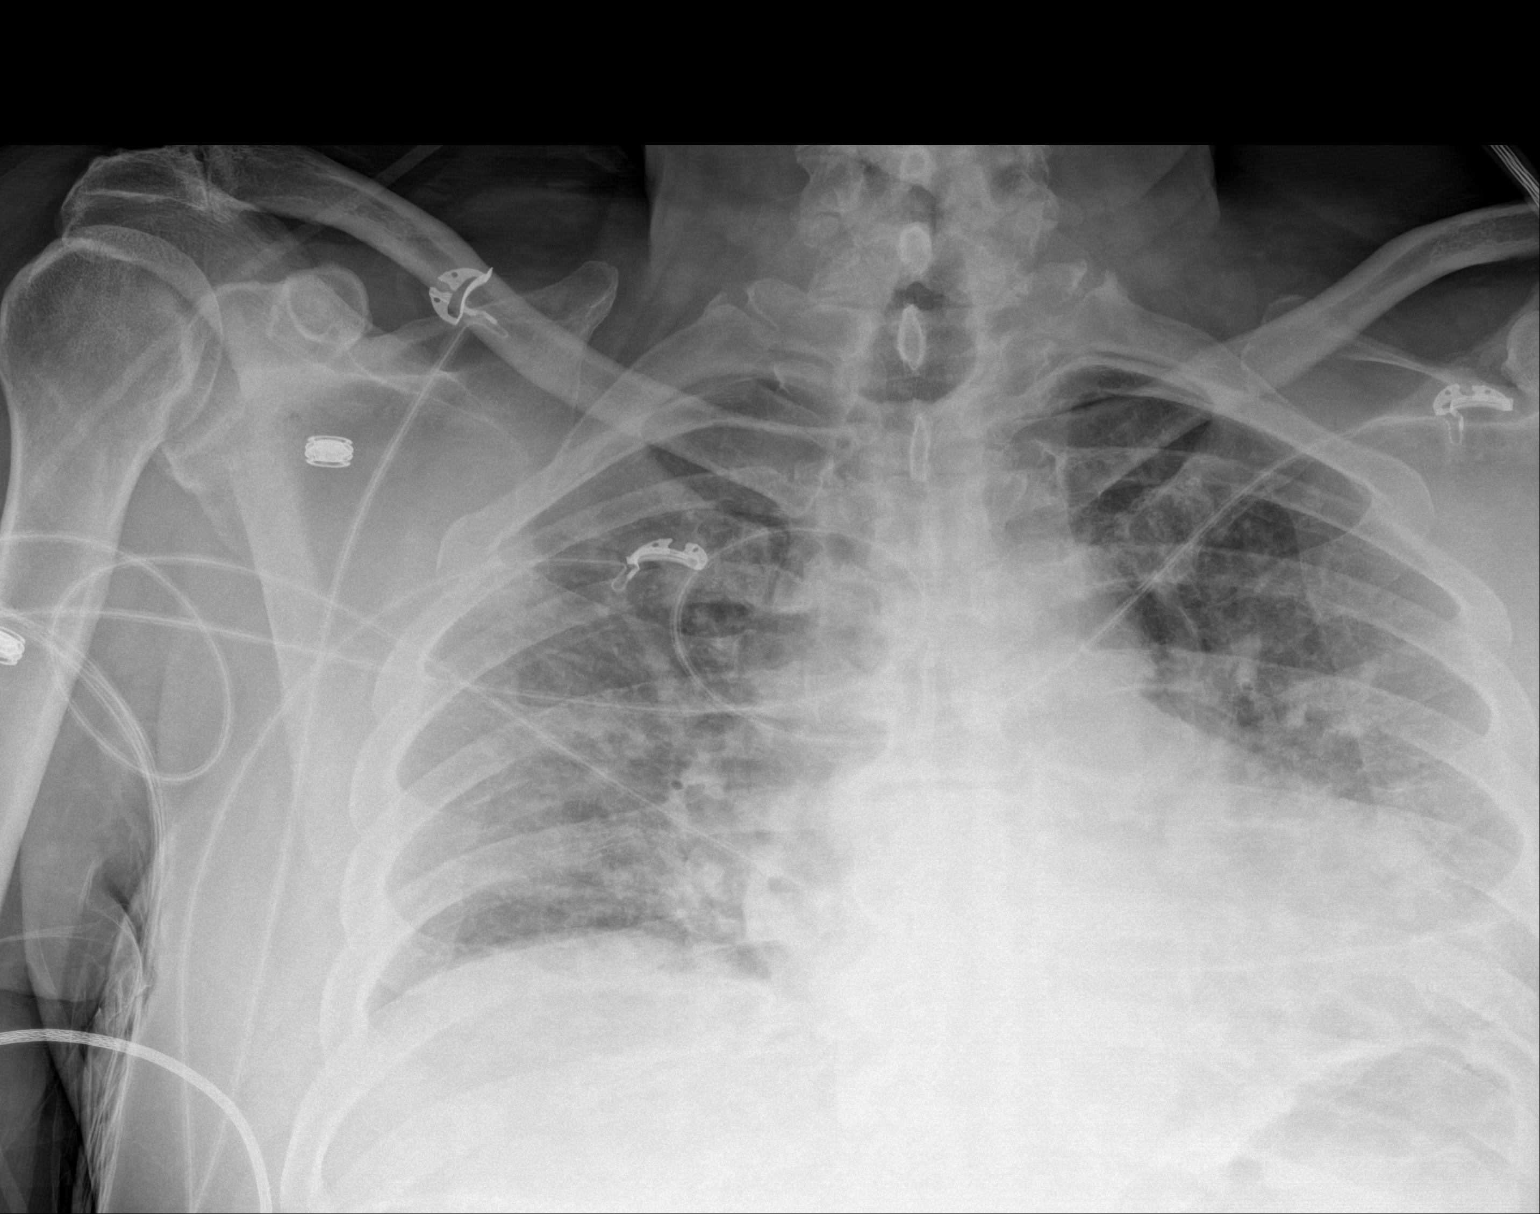

[2 of 2 positions shown; findings below may reference images not displayed]

FINDINGS: Low lung volumes. Stable cardiomediastinal silhouette with normal
heart size. No pneumothorax. No pleural effusion. No overt pulmonary
edema. No acute consolidative airspace disease.
IMPRESSION: Low lung volumes, without active cardiopulmonary disease.

## 2018-09-14 IMAGING — CT CT HEAD WITHOUT CONTRAST
3 series · 15 of 47 positions shown, 18 images · non-contrast
Comparison: None.

CLINICAL DATA: Altered mental status. Hypertension. Symptoms began
today.

EXAM:
CT HEAD WITHOUT CONTRAST
TECHNIQUE: Contiguous axial images were obtained from the base of the skull
through the vertex without intravenous contrast.

[Series 3: head wo · axial · 0.40mm/px · z∈[-236,-106]mm · 9 of 32 slices shown, 12 images]
[im 3/32  brain]
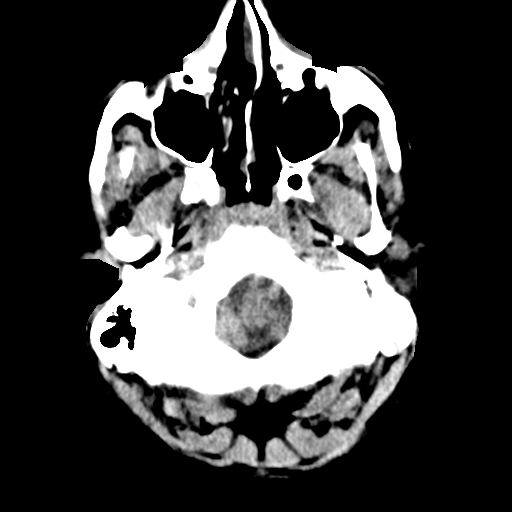
[im 3/32  bone]
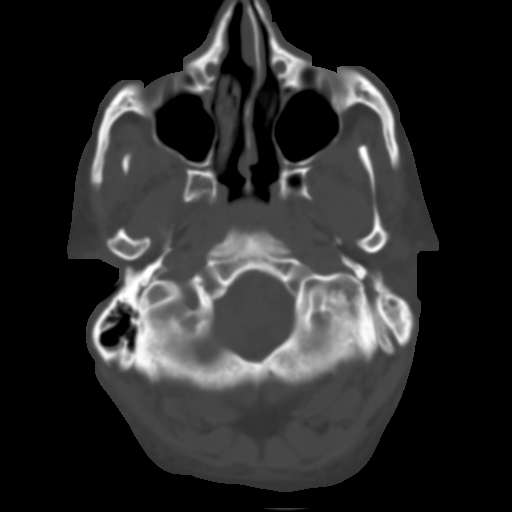
[im 6/32  brain]
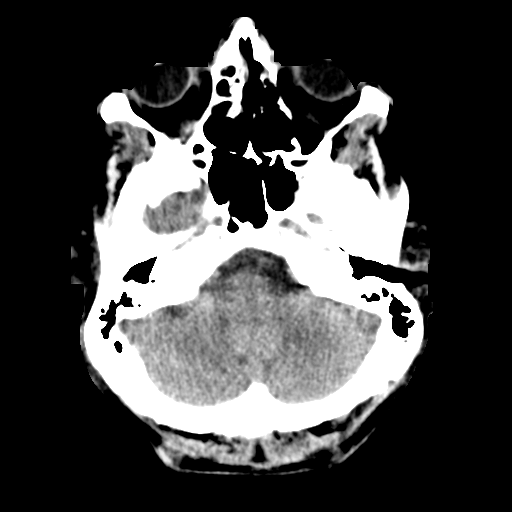
[im 9/32  brain]
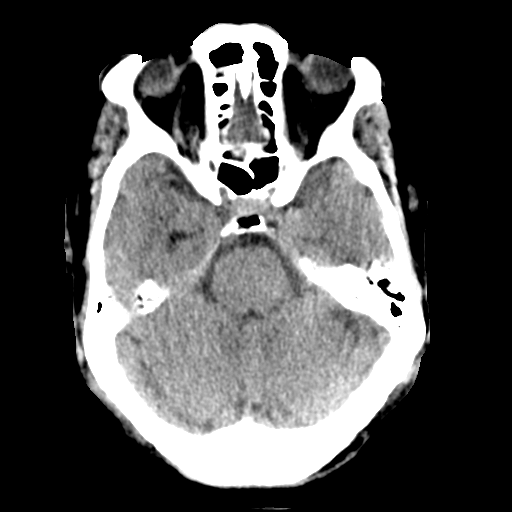
[im 12/32  brain]
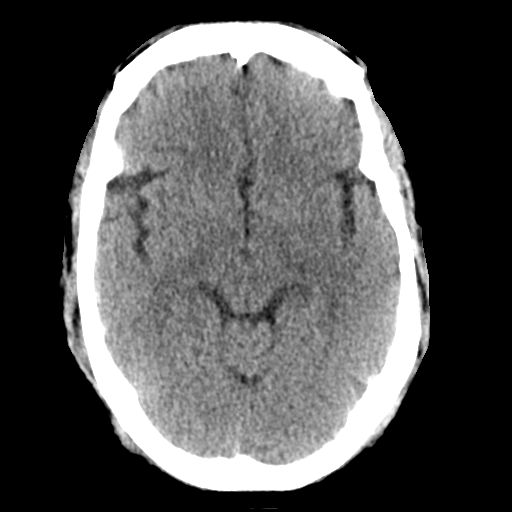
[im 17/32  brain]
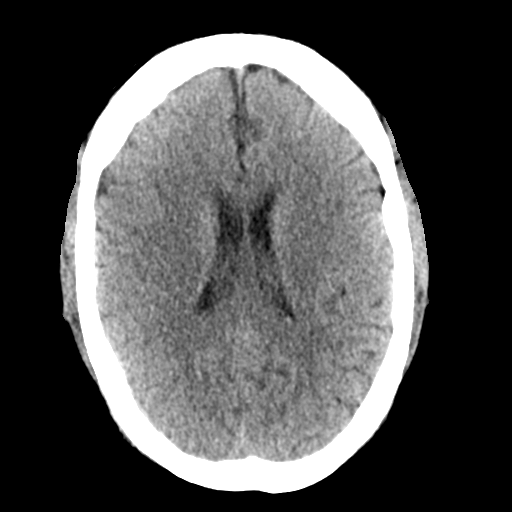
[im 17/32  bone]
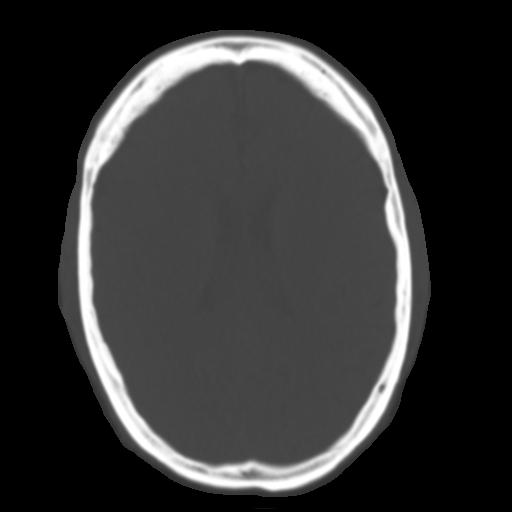
[im 20/32  brain]
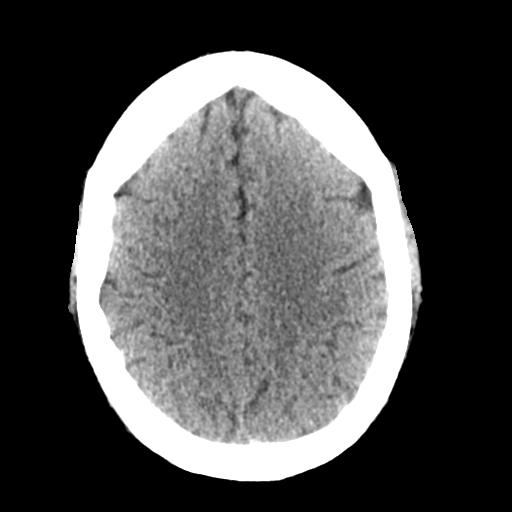
[im 23/32  brain]
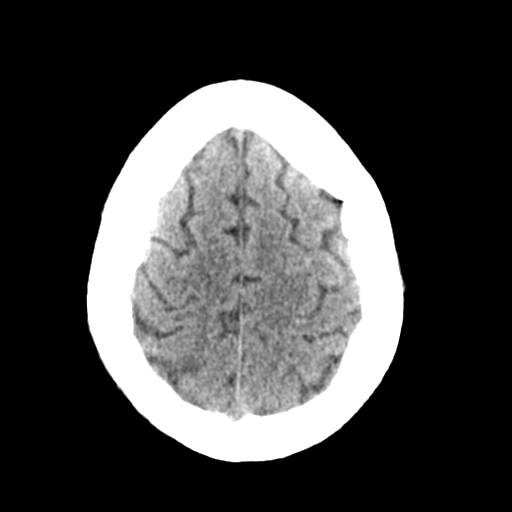
[im 26/32  brain]
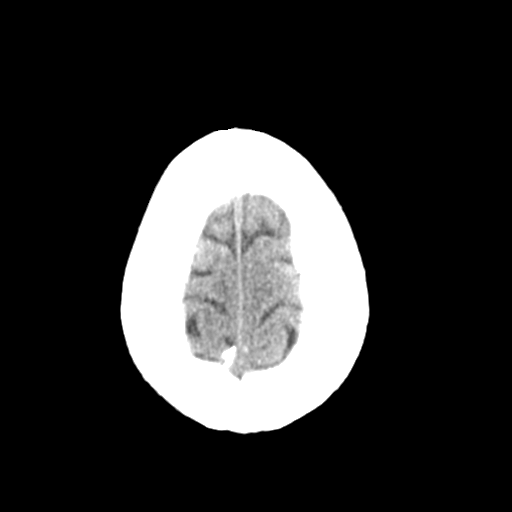
[im 29/32  brain]
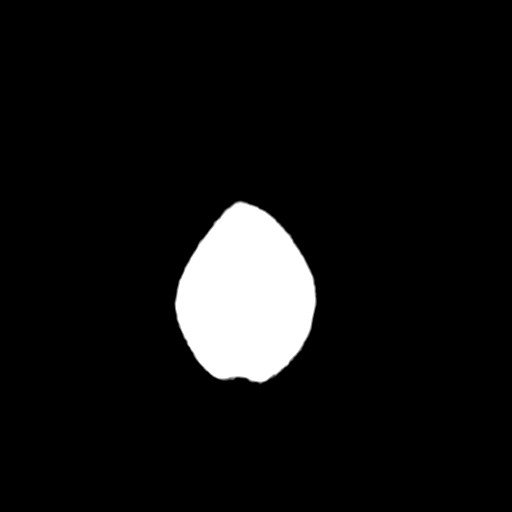
[im 29/32  bone]
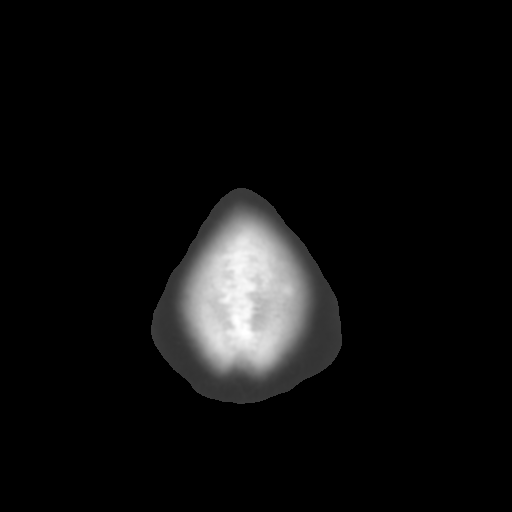

[Series 4: coronal soft tissue · coronal · 0.31mm/px · 3 of 67 slices shown]
[im 23/67  brain]
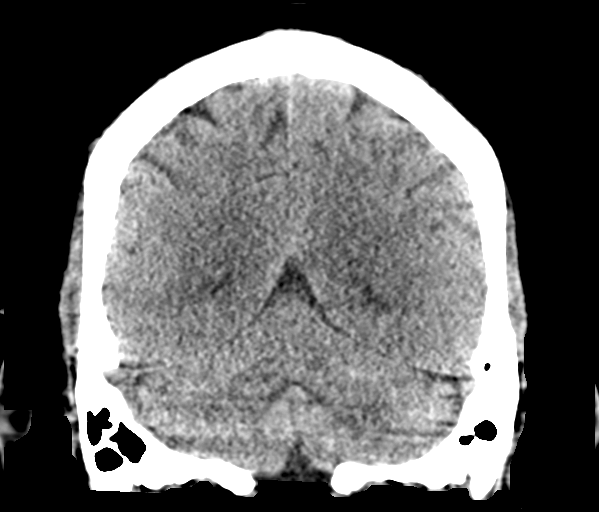
[im 30/67  brain]
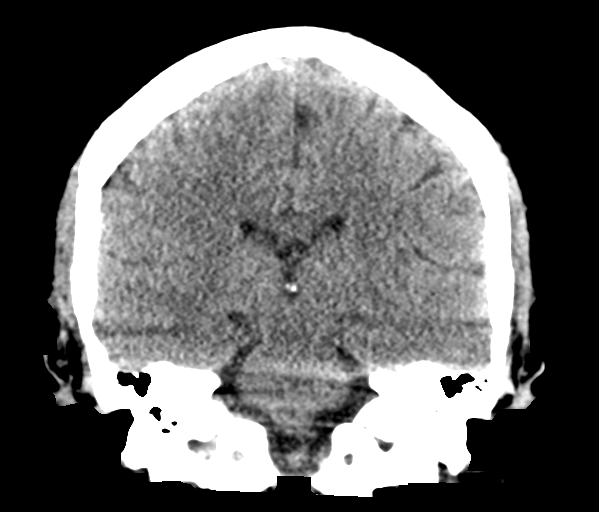
[im 37/67  brain]
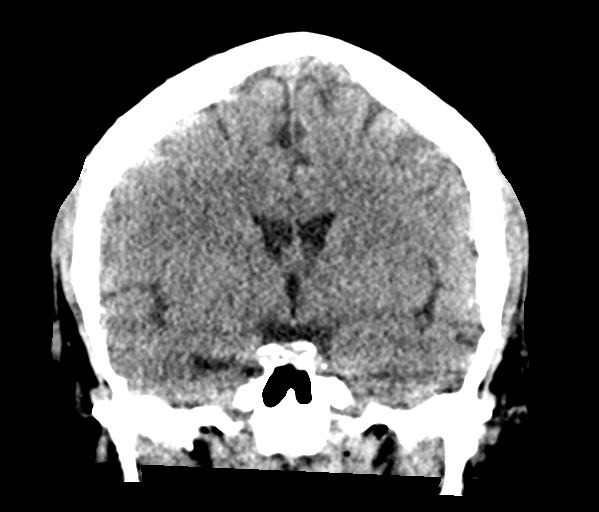

[Series 5: sagittal soft tissue · sagittal · 0.31mm/px · 3 of 55 slices shown]
[im 19/55  brain]
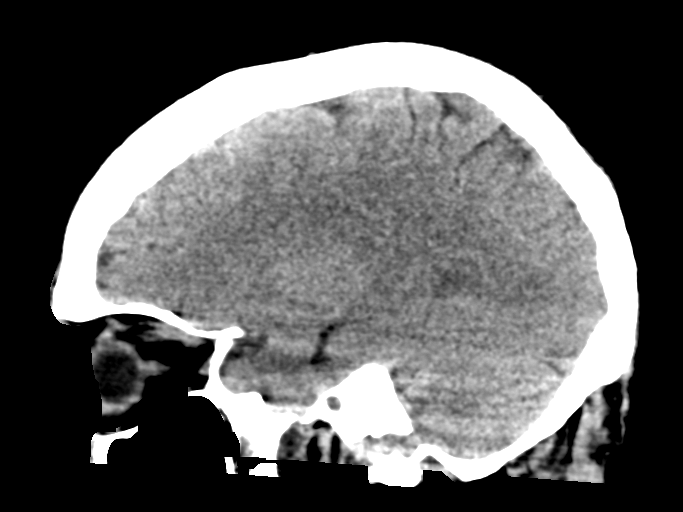
[im 28/55  brain]
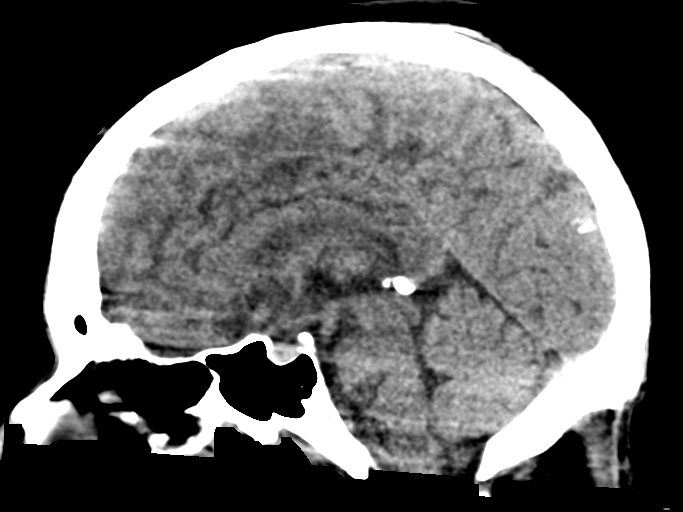
[im 37/55  brain]
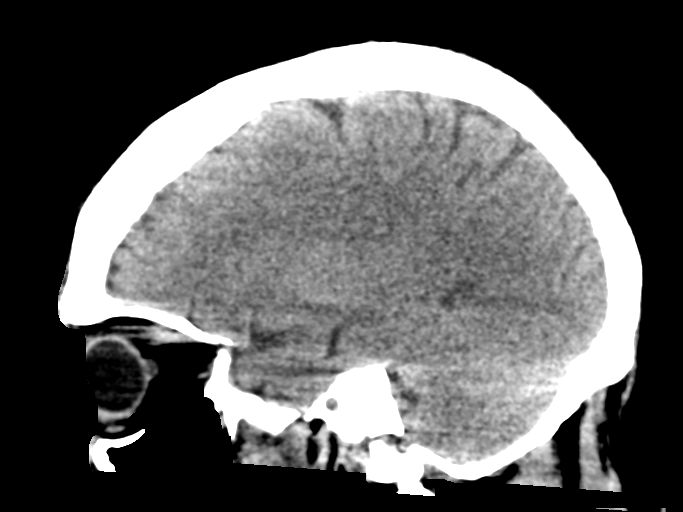

[15 of 47 positions shown; findings below may reference images not displayed]

FINDINGS: Brain: The brain shows a normal appearance without evidence of
malformation, atrophy, old or acute small or large vessel
infarction, mass lesion, hemorrhage, hydrocephalus or extra-axial
collection.

Vascular: No hyperdense vessel. No evidence of atherosclerotic
calcification.

Skull: Normal.  No traumatic finding.  No focal bone lesion.

Sinuses/Orbits: Sinuses are clear. Orbits appear normal. Mastoids
are clear.

Other: None significant
IMPRESSION: Normal head CT

## 2018-09-14 IMAGING — DX PORTABLE CHEST - 1 VIEW
1 series · 1 of 1 positions shown · non-contrast
Comparison: [DATE]

CLINICAL DATA: Central line placement

EXAM:
PORTABLE CHEST 1 VIEW

[chest ap]
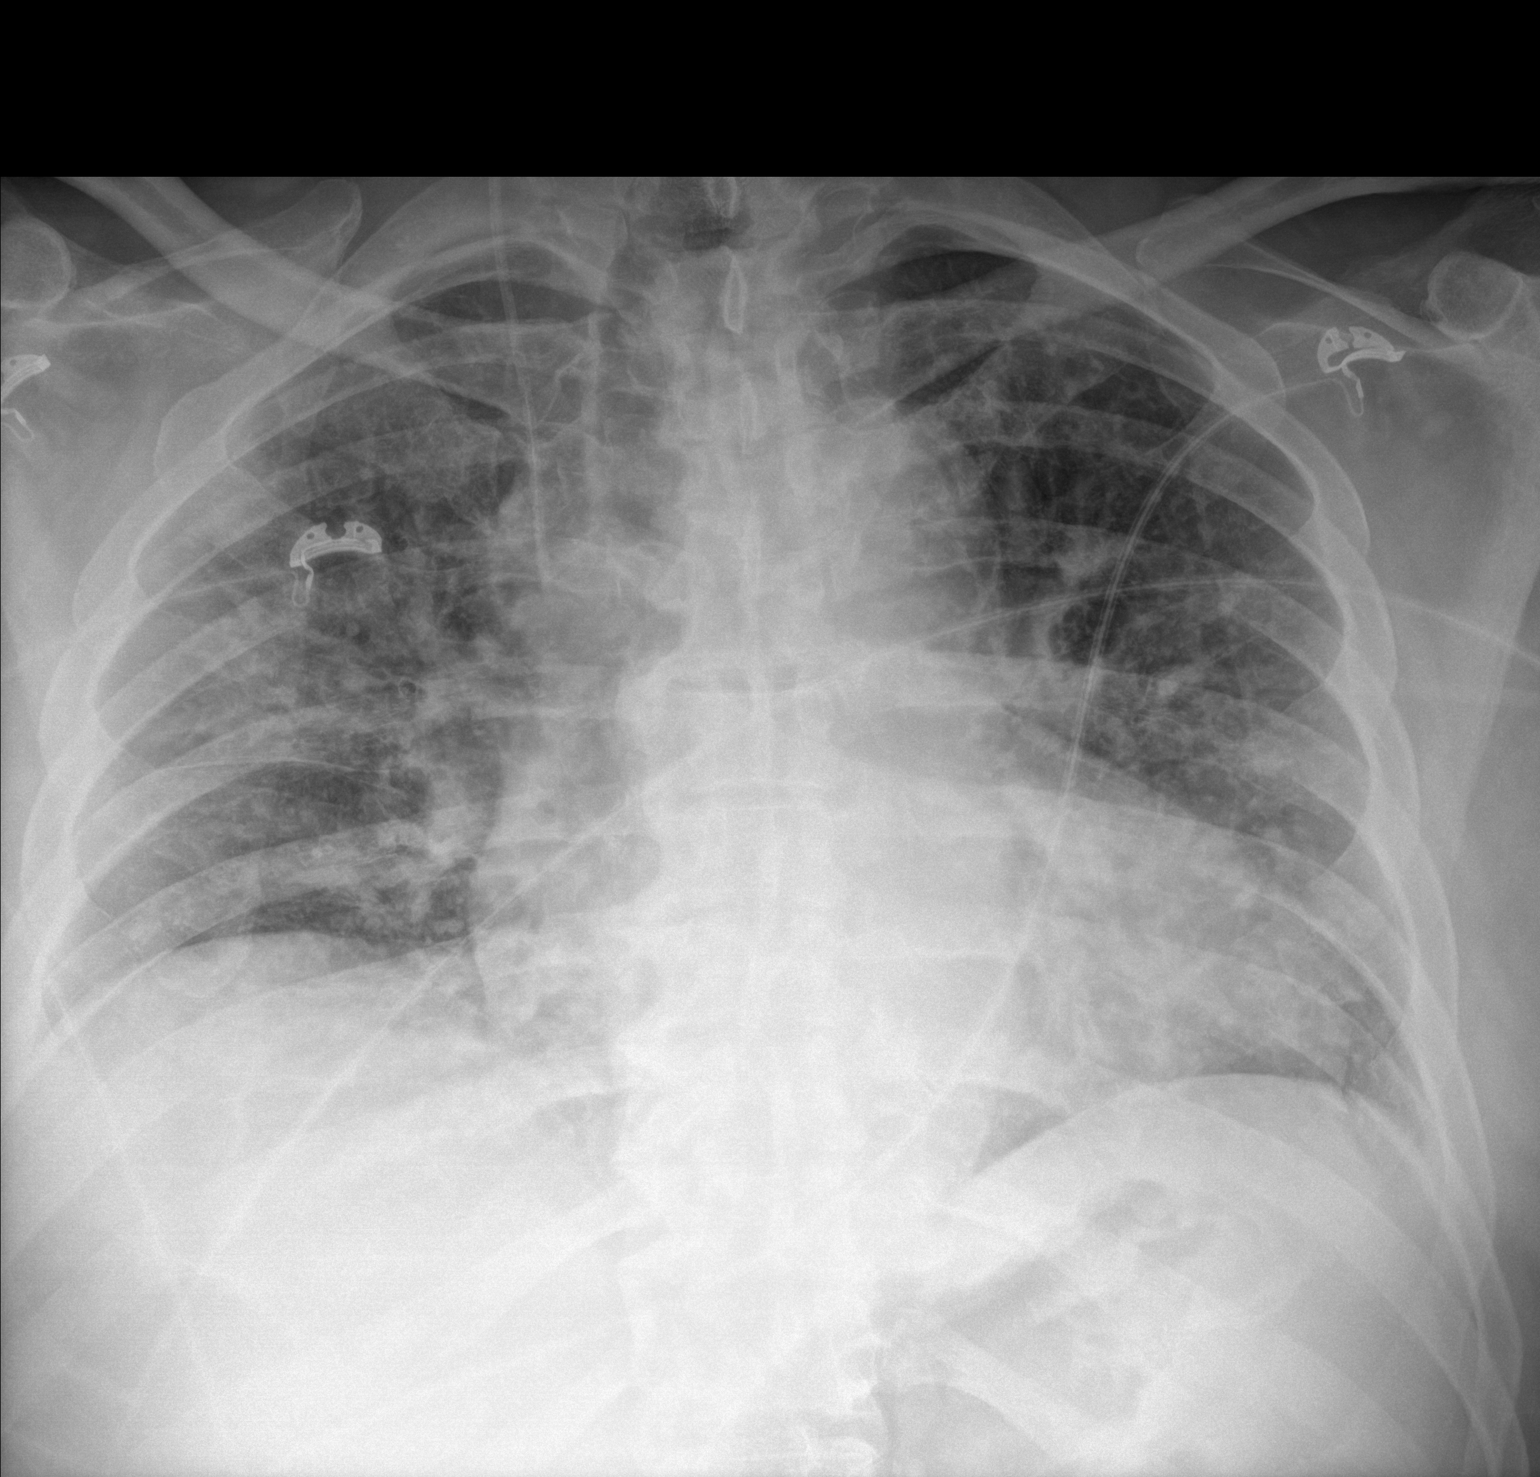

[1 of 1 positions shown; findings below may reference images not displayed]

FINDINGS: There is a newly placed right-sided central venous catheter with tip
terminating over the SVC. There is no pneumothorax. The lung volumes
are low. The heart size is enlarged. There are hazy opacities
bilaterally with prominent interstitial lung markings. There is a
small right-sided pleural effusion. There is likely a trace
left-sided pleural effusion.
IMPRESSION: 1. Right-sided central venous catheter as above with no
pneumothorax.
2. Low lung volumes.
3. Borderline enlarged heart.
4. Prominent interstitial lung markings and a few hazy ground-glass
airspace opacities bilaterally are nonspecific but can be seen in
patients with pulmonary edema or an atypical infectious process.
5. Developing small right-sided pleural effusion.

## 2018-09-14 MED ORDER — NOREPINEPHRINE 16 MG/250ML-% IV SOLN
0.0000 ug/min | INTRAVENOUS | Status: DC
  Administered 2018-09-14: 12 ug/min via INTRAVENOUS
  Administered 2018-09-16: 20:00:00 4 ug/min via INTRAVENOUS
  Filled 2018-09-14 (×4): qty 250

## 2018-09-14 MED ORDER — NOREPINEPHRINE 4 MG/250ML-% IV SOLN
0.0000 ug/min | INTRAVENOUS | Status: DC
  Administered 2018-09-14: 2 ug/min via INTRAVENOUS
  Filled 2018-09-14: qty 250

## 2018-09-14 MED ORDER — INSULIN GLARGINE 100 UNIT/ML ~~LOC~~ SOLN
8.0000 [IU] | Freq: Every day | SUBCUTANEOUS | Status: DC
  Administered 2018-09-14: 8 [IU] via SUBCUTANEOUS
  Filled 2018-09-14 (×2): qty 0.08

## 2018-09-14 MED ORDER — CHLORHEXIDINE GLUCONATE CLOTH 2 % EX PADS: 6.0000 | MEDICATED_PAD | Freq: Every day | CUTANEOUS | Status: DC

## 2018-09-14 MED ORDER — CHLORHEXIDINE GLUCONATE CLOTH 2 % EX PADS
6.0000 | MEDICATED_PAD | Freq: Every day | CUTANEOUS | Status: DC
  Administered 2018-09-14 – 2018-10-08 (×17): 6 via TOPICAL

## 2018-09-14 MED ORDER — VASOPRESSIN 20 UNIT/ML IV SOLN
0.0300 [IU]/min | INTRAVENOUS | Status: DC
  Administered 2018-09-14: 13:00:00 0.03 [IU]/min via INTRAVENOUS
  Filled 2018-09-14 (×3): qty 2

## 2018-09-14 MED ORDER — NOREPINEPHRINE BITARTRATE 1 MG/ML IV SOLN
0.0000 ug/min | INTRAVENOUS | Status: DC
  Filled 2018-09-14: qty 4

## 2018-09-14 NOTE — Progress Notes (Signed)
[facility]  ROUNDING NOTE   Subjective:   Phenylephrine gtt  Patient awake, alert, eating breakfast.   Tmax 103.3  Bicarb gtt  Objective:  Vital signs in last 24 hours:  Temp:  [99 F (37.2 C)-103.3 F (39.6 C)] 99 F (37.2 C) (05/28 0800) Pulse Rate:  [39-136] 88 (05/28 0930) Resp:  [13-33] 21 (05/28 0930) BP: (61-139)/(26-123) 102/77 (05/28 0930) SpO2:  [69 %-100 %] 91 % (05/28 0930)  Weight change:  Filed Weights   09/12/18 0935  Weight: (!) 145.2 kg    Intake/Output: I/O last 3 completed shifts: In: 7500.4 [P.O.:780; I.V.:4654.6; Other:1600; IV Piggyback:465.7] Out: 1655 [Urine:1655]   Intake/Output this shift:  Total I/O In: 452.7 [I.V.:452.7] Out: -   Physical Exam: General: NAD, ill appearing  Head: Normocephalic, atraumatic. Moist oral mucosal membranes  Eyes: Anicteric, PERRL  Neck: Supple, trachea midline  Lungs:  Clear to auscultation  Heart: Regular rate and rhythm  Abdomen:  Soft, nontender, obese  Extremities:  ++ peripheral edema. +erythema around bilateral knee incisions  Neurologic: Alert and oriented  Skin: No lesions  Access: none    Basic Metabolic Panel: Recent Labs  Lab 09/12/18 0955 09/13/18 0344 09/14/18 0336  NA 126* 126* 126*  K 4.4 4.1 3.8  CL 90* 96* 92*  CO2 17* 18* 24  GLUCOSE 129* 196* 247*  BUN 28* 34* 29*  CREATININE 3.71* 4.31* 3.50*  CALCIUM 9.7 7.9* 7.8*    Liver Function Tests: Recent Labs  Lab 09/12/18 0955  AST 70*  ALT 42  ALKPHOS 86  BILITOT 0.7  PROT 7.6  ALBUMIN 3.0*   Recent Labs  Lab 09/12/18 0955  LIPASE 74*   No results for input(s): AMMONIA in the last 168 hours.  CBC: Recent Labs  Lab 09/12/18 0955 09/13/18 0344 09/14/18 0336  WBC 16.1* 9.4 5.9  NEUTROABS 10.3*  --  3.5  HGB 11.5* 8.7* 8.0*  HCT 35.7* 26.7* 24.5*  MCV 82.4 81.9 81.9  PLT 137* 137* 144*    Cardiac Enzymes: Recent Labs  Lab 09/13/18 0344  CKTOTAL 4,516*    BNP: Invalid input(s):  POCBNP  CBG: Recent Labs  Lab 09/13/18 0749 09/13/18 1146 09/13/18 1645 09/13/18 2100 09/14/18 0757  GLUCAP 199* 191* 246* 237* 247*    Microbiology: Results for orders placed or performed during the hospital encounter of 09/12/18  Blood Culture (routine x 2)     Status: None (Preliminary result)   Collection Time: 09/12/18  9:56 AM  Result Value Ref Range Status   Specimen Description BLOOD LEFT HAND  Final   Special Requests   Final    BOTTLES DRAWN AEROBIC AND ANAEROBIC Blood Culture adequate volume   Culture   Final    NO GROWTH 2 DAYS Performed at [facility], [address] [place], [place] [address]    Report Status PENDING  Incomplete  Urine culture     Status: None   Collection Time: 09/12/18 10:01 AM  Result Value Ref Range Status   Specimen Description   Final    URINE, RANDOM Performed at [facility], [address], [place], [place] [address]    Special Requests   Final    NONE Performed at [facility], [address] [place], [place] [address]    Culture   Final    NO GROWTH Performed at [facility], [place] [address], [place], [place] [address]    Report Status 09/13/2018 FINAL  Final  SARS  Coronavirus 2 (CEPHEID- Performed in [place] hospital lab), Hosp Order     Status: None   Collection Time: 09/12/18 10:02 AM  Result Value Ref Range Status   SARS Coronavirus 2 NEGATIVE NEGATIVE Final    Comment: (NOTE) If result is NEGATIVE SARS-CoV-2 target nucleic acids are NOT DETECTED. The SARS-CoV-2 RNA is generally detectable in upper and lower  respiratory specimens during the acute phase of infection. The lowest  concentration of SARS-CoV-2 viral copies this assay can detect is 250  copies / mL. A negative result does not preclude SARS-CoV-2 infection  and should not be used as the sole basis for treatment or other  patient management decisions.  A negative result may occur with  improper specimen collection /  handling, submission of specimen other  than nasopharyngeal swab, presence of viral mutation(s) within the  areas targeted by this assay, and inadequate number of viral copies  (<250 copies / mL). A negative result must be combined with clinical  observations, patient history, and epidemiological information. If result is POSITIVE SARS-CoV-2 target nucleic acids are DETECTED. The SARS-CoV-2 RNA is generally detectable in upper and lower  respiratory specimens dur ing the acute phase of infection.  Positive  results are indicative of active infection with SARS-CoV-2.  Clinical  correlation with patient history and other diagnostic information is  necessary to determine patient infection status.  Positive results do  not rule out bacterial infection or co-infection with other viruses. If result is PRESUMPTIVE POSTIVE SARS-CoV-2 nucleic acids MAY BE PRESENT.   A presumptive positive result was obtained on the submitted specimen  and confirmed on repeat testing.  While 2019 novel coronavirus  (SARS-CoV-2) nucleic acids may be present in the submitted sample  additional confirmatory testing may be necessary for epidemiological  and / or clinical management purposes  to differentiate between  SARS-CoV-2 and other Sarbecovirus currently known to infect humans.  If clinically indicated additional testing with an alternate test  methodology [phone]) is advised. The SARS-CoV-2 RNA is generally  detectable in upper and lower respiratory sp ecimens during the acute  phase of infection. The expected result is Negative. Fact Sheet for Patients:  StrictlyIdeas.no Fact Sheet for Healthcare Providers: BankingDealers.co.za This test is not yet approved or cleared by the [place] FDA and has been authorized for detection and/or diagnosis of SARS-CoV-2 by FDA under an Emergency Use Authorization (EUA).  This EUA will remain in effect (meaning this  test can be used) for the duration of the COVID-19 declaration under Section 564(b)(1) of the Act, 21 U.S.C. section 360bbb-3(b)(1), unless the authorization is terminated or revoked sooner. Performed at [facility], [address] [place], [place] [address]   Blood Culture (routine x 2)     Status: None (Preliminary result)   Collection Time: 09/12/18 10:06 AM  Result Value Ref Range Status   Specimen Description BLOOD LEFT ANTECUBITAL  Final   Special Requests   Final    BOTTLES DRAWN AEROBIC AND ANAEROBIC Blood Culture adequate volume   Culture   Final    NO GROWTH 2 DAYS Performed at [facility], [address] [place], [place] [address]    Report Status PENDING  Incomplete  MRSA PCR Screening     Status: None   Collection Time: 09/12/18  7:04 PM  Result Value Ref Range Status   MRSA by PCR NEGATIVE NEGATIVE Final    Comment:        The GeneXpert MRSA Assay (FDA approved  for NASAL specimens only), is one component of a comprehensive MRSA colonization surveillance program. It is not intended to diagnose MRSA infection nor to guide or monitor treatment for MRSA infections. Performed at [facility], [address] [place], [place] [address]     Coagulation Studies: Recent Labs    09/12/18 0955  LABPROT 15.3*  INR 1.2    Urinalysis: Recent Labs    09/12/18 1001  COLORURINE AMBER*  LABSPEC 1.026  PHURINE 5.0  GLUCOSEU NEGATIVE  HGBUR NEGATIVE  BILIRUBINUR NEGATIVE  KETONESUR 5*  PROTEINUR 100*  NITRITE NEGATIVE  LEUKOCYTESUR NEGATIVE      Imaging: US Renal  Result Date: 09/13/2018 CLINICAL DATA:  Acute renal failure EXAM: RENAL / URINARY TRACT ULTRASOUND COMPLETE COMPARISON:  CT dated 04/30/2015 FINDINGS: Right Kidney: Renal measurements: 15.8 x 6.6 x 6.4 cm = volume: 349 mL. There is a simple cyst measuring 4.1 cm in the interpolar region. There is a simple appearing cyst in the lower pole measuring 4.4 cm. Left  Kidney: Renal measurements: 3.5 x 6 x 5.1 cm = volume: 214 mL. Echogenicity within normal limits. No mass or hydronephrosis visualized. Bladder: Poorly evaluated secondary to the presence of a Foley catheter. IMPRESSION: 1. No acute sonographic abnormality detected.  No hydronephrosis. 2. Decompressed urinary bladder which limits evaluation. Electronically Signed   By: Constance Holster M.D.   On: 09/13/2018 19:19   US Venous Img Lower Bilateral  Result Date: 09/13/2018 CLINICAL DATA:  Leg pain and swelling. EXAM: BILATERAL LOWER EXTREMITY VENOUS DOPPLER ULTRASOUND TECHNIQUE: Gray-scale sonography with graded compression, as well as color Doppler and duplex ultrasound were performed to evaluate the lower extremity deep venous systems from the level of the common femoral vein and including the common femoral, femoral, profunda femoral, popliteal and calf veins including the posterior tibial, peroneal and gastrocnemius veins when visible. The superficial great saphenous vein was also interrogated. Spectral Doppler was utilized to evaluate flow at rest and with distal augmentation maneuvers in the common femoral, femoral and popliteal veins. COMPARISON:  None. FINDINGS: RIGHT LOWER EXTREMITY Common Femoral Vein: No evidence of thrombus. Normal compressibility, respiratory phasicity and response to augmentation. Saphenofemoral Junction: No evidence of thrombus. Normal compressibility and flow on color Doppler imaging. Profunda Femoral Vein: No evidence of thrombus. Normal compressibility and flow on color Doppler imaging. Femoral Vein: No evidence of thrombus. Normal compressibility, respiratory phasicity and response to augmentation. Popliteal Vein: No evidence of thrombus. Normal compressibility, respiratory phasicity and response to augmentation. Calf Veins: No evidence of thrombus. Normal compressibility and flow on color Doppler imaging. The peroneal vein was not well visualized. Superficial Great Saphenous  Vein: No evidence of thrombus. Normal compressibility. Venous Reflux:  None. Other Findings:  None. LEFT LOWER EXTREMITY Common Femoral Vein: No evidence of thrombus. Normal compressibility, respiratory phasicity and response to augmentation. Saphenofemoral Junction: No evidence of thrombus. Normal compressibility and flow on color Doppler imaging. Profunda Femoral Vein: No evidence of thrombus. Normal compressibility and flow on color Doppler imaging. Femoral Vein: No evidence of thrombus. Normal compressibility, respiratory phasicity and response to augmentation. Popliteal Vein: No evidence of thrombus. Normal compressibility, respiratory phasicity and response to augmentation. Calf Veins: No evidence of thrombus. Normal compressibility and flow on color Doppler imaging. The peroneal vein was not well visualized. Superficial Great Saphenous Vein: No evidence of thrombus. Normal compressibility. Venous Reflux:  None. Other Findings:  None. IMPRESSION: No evidence of deep venous thrombosis in either lower extremity. The bilateral peroneal veins were not well visualized.  Electronically Signed   By: Constance Holster M.D.   On: 09/13/2018 19:17   Dg Chest Port 1 View  Result Date: 09/12/2018 CLINICAL DATA:  58 year old male with a history of increasing dizziness EXAM: PORTABLE CHEST 1 VIEW COMPARISON:  11/09/2006 FINDINGS: Cardiomediastinal silhouette unchanged in size and contour. No pneumothorax or pleural effusion. Low lung volumes with coarsened interstitial markings. Linear opacity at the left lung base with no confluent airspace disease. IMPRESSION: Low lung volumes without evidence of acute cardiopulmonary disease. Electronically Signed   By: Corrie Mckusick D.O.   On: 09/12/2018 10:19   Dg Knee Complete 4 Views Left  Result Date: 09/12/2018 CLINICAL DATA:  58 year old male with a history of knee replacement EXAM: LEFT KNEE - COMPLETE 4+ VIEW COMPARISON:  None. FINDINGS: No acute displaced fracture.  Surgical changes of arthroplasty left knee. Joint effusion. Surgical staples project within the soft tissues of the midline. Soft tissue swelling at the surgical site. IMPRESSION: Early surgical changes of left knee arthroplasty, with soft tissue swelling and joint effusion. Electronically Signed   By: Corrie Mckusick D.O.   On: 09/12/2018 10:15     Medications:   . ceFEPime (MAXIPIME) IV Stopped (09/13/18 2129)  . phenylephrine (NEO-SYNEPHRINE) Adult infusion 5 mcg/min (09/14/18 0928)  .  sodium bicarbonate  infusion 1000 mL 125 mL/hr at 09/14/18 0928  . vancomycin Stopped (09/13/18 1452)   . albuterol  2.5 mg Nebulization Q6H  . aspirin EC  81 mg Oral Daily  . atenolol  25 mg Oral QPM  . Chlorhexidine Gluconate Cloth  6 each Topical Q0600  . citalopram  20 mg Oral Daily  . docusate sodium  100 mg Oral BID  . fluticasone furoate-vilanterol  1 puff Inhalation Daily  . heparin  5,000 Units Subcutaneous Q8H  . insulin aspart  0-5 Units Subcutaneous QHS  . insulin aspart  0-9 Units Subcutaneous TID WC  . loratadine  10 mg Oral Daily  . rosuvastatin  20 mg Oral Daily  . tamsulosin  0.4 mg Oral Daily   acetaminophen **OR** acetaminophen, HYDROcodone-acetaminophen, HYDROcodone-acetaminophen, ondansetron **OR** ondansetron (ZOFRAN) IV  Assessment/ Plan:  Isaac Cross is a 58 y.o. black male with hyeprtension, COPD, diabetes mellitus type II who underwent bilateral knee replacement surgeries last week. who was admitted to [facility] on 09/12/2018 for sepsis  1. Acute renal failure with metabolic acidosis: baseline creatinine of 1.07 with normal GFR in 06/12/18.  TMP/SMX could have been contributing to renal failure Nonoliguric urine output - Hold sodium bicarbonate gtt - Holding metformin, lisinopril and TMP/SMX  2. Septic shock/hypotension - unclear source Appreciate ortho input.  Appreciate ID input - discontinue tamsulosin and atenolol  3. Hyponatremia: secondary to renal failure and  hypervolemic volume status. Continue to monitor.    LOS: 2 Isaac Cross 5/28/20209:41 AM

## 2018-09-14 NOTE — Procedures (Signed)
Central Venous Catheter Insertion Procedure Note JESUSMANUEL ERBES [id number] March 08, 1961  Procedure: Insertion of Central Venous Catheter Indications: Assessment of intravascular volume, Drug and/or fluid administration and Frequent blood sampling  Procedure Details Consent: Risks of procedure as well as the alternatives and risks of each were explained to the (patient/caregiver).  Consent for procedure obtained. Time Out: Verified patient identification, verified procedure, site/side was marked, verified correct patient position, special equipment/implants available, medications/allergies/relevent history reviewed, required imaging and test results available.  Performed  Maximum sterile technique was used including antiseptics, cap, gloves, gown, hand hygiene, mask and sheet. Skin prep: Chlorhexidine; local anesthetic administered A antimicrobial bonded/coated triple lumen catheter was placed in the right internal jugular vein using the Seldinger technique.  Evaluation Blood flow good Complications: No apparent complications Patient did tolerate procedure well. Chest X-ray ordered to verify placement.  CXR: pending.  Right internal jugular CVL placed utilizing ultrasound no complications noted during or following procedure.  Marda Stalker, [place] Pager [phone] (please enter 7 digits) PCCM Consult Pager [phone] (please enter 7 digits)

## 2018-09-14 NOTE — Progress Notes (Signed)
Subjective:  Patient reports pain as mild.  Bilateral knee pain.  Objective:   VITALS:   Vitals:   09/14/18 1500 09/14/18 1515 09/14/18 1530 09/14/18 1600  BP: (!) 102/42   (!) 101/51  Pulse: 94 97 (!) 103 98  Resp: (!) 24 (!) 30 10 17   Temp:      TempSrc:      SpO2: (!) 89% 92% 95% 94%  Weight:      Height:        PHYSICAL EXAM:  Neurologically intact ABD soft Sensation intact distally Dorsiflexion/Plantar flexion intact Incision: dressing C/D/I No cellulitis present Compartment soft  LABS  Results for orders placed or performed during the hospital encounter of 09/12/18 (from the past 24 hour(s))  Glucose, capillary     Status: Abnormal   Collection Time: 09/13/18  4:45 PM  Result Value Ref Range   Glucose-Capillary 246 (H) 70 - 99 mg/dL  Glucose, capillary     Status: Abnormal   Collection Time: 09/13/18  9:00 PM  Result Value Ref Range   Glucose-Capillary 237 (H) 70 - 99 mg/dL   Comment 1 Document in Chart   Procalcitonin     Status: None   Collection Time: 09/14/18  3:36 AM  Result Value Ref Range   Procalcitonin 6.81 ng/mL  Basic metabolic panel     Status: Abnormal   Collection Time: 09/14/18  3:36 AM  Result Value Ref Range   Sodium 126 (L) 135 - 145 mmol/L   Potassium 3.8 3.5 - 5.1 mmol/L   Chloride 92 (L) 98 - 111 mmol/L   CO2 24 22 - 32 mmol/L   Glucose, Bld 247 (H) 70 - 99 mg/dL   BUN 29 (H) 6 - 20 mg/dL   Creatinine, Ser 3.50 (H) 0.61 - 1.24 mg/dL   Calcium 7.8 (L) 8.9 - 10.3 mg/dL   GFR calc non Af Amer 18 (L) >60 mL/min   GFR calc Af Amer 21 (L) >60 mL/min   Anion gap 10 5 - 15  CBC with Differential/Platelet     Status: Abnormal   Collection Time: 09/14/18  3:36 AM  Result Value Ref Range   WBC 5.9 4.0 - 10.5 K/uL   RBC 2.99 (L) 4.22 - 5.81 MIL/uL   Hemoglobin 8.0 (L) 13.0 - 17.0 g/dL   HCT 24.5 (L) 39.0 - 52.0 %   MCV 81.9 80.0 - 100.0 fL   MCH 26.8 26.0 - 34.0 pg   MCHC 32.7 30.0 - 36.0 g/dL   RDW 14.6 11.5 - 15.5 %   Platelets  144 (L) 150 - 400 K/uL   nRBC 0.0 0.0 - 0.2 %   Neutrophils Relative % 59 %   Neutro Abs 3.5 1.7 - 7.7 K/uL   Lymphocytes Relative 20 %   Lymphs Abs 1.2 0.7 - 4.0 K/uL   Monocytes Relative 17 %   Monocytes Absolute 1.0 0.1 - 1.0 K/uL   Eosinophils Relative 2 %   Eosinophils Absolute 0.1 0.0 - 0.5 K/uL   Basophils Relative 1 %   Basophils Absolute 0.1 0.0 - 0.1 K/uL   Immature Granulocytes 1 %   Abs Immature Granulocytes 0.08 (H) 0.00 - 0.07 K/uL  Technologist smear review     Status: None   Collection Time: 09/14/18  3:36 AM  Result Value Ref Range   Tech Review MORPHOLOGY UNREMARKABLE   Glucose, capillary     Status: Abnormal   Collection Time: 09/14/18  7:57 AM  Result Value Ref Range  Glucose-Capillary 247 (H) 70 - 99 mg/dL  Fibrinogen     Status: Abnormal   Collection Time: 09/14/18 11:21 AM  Result Value Ref Range   Fibrinogen 659 (H) 210 - 475 mg/dL  Protime-INR     Status: Abnormal   Collection Time: 09/14/18 11:21 AM  Result Value Ref Range   Prothrombin Time 15.7 (H) 11.4 - 15.2 seconds   INR 1.3 (H) 0.8 - 1.2  Blood gas, arterial     Status: Abnormal   Collection Time: 09/14/18 12:24 PM  Result Value Ref Range   FIO2 0.21    pH, Arterial 7.45 7.350 - 7.450   pCO2 arterial 35 32.0 - 48.0 mmHg   pO2, Arterial 63 (L) 83.0 - 108.0 mmHg   Bicarbonate 24.3 20.0 - 28.0 mmol/L   Acid-Base Excess 0.7 0.0 - 2.0 mmol/L   O2 Saturation 92.8 %   Patient temperature 37.0    Collection site A-LINE    Sample type ARTERIAL DRAW    Allens test (pass/fail) PASS PASS  Glucose, capillary     Status: Abnormal   Collection Time: 09/14/18  1:16 PM  Result Value Ref Range   Glucose-Capillary 187 (H) 70 - 99 mg/dL    Ct Head Wo Contrast  Result Date: 09/14/2018 CLINICAL DATA:  Altered mental status. Hypertension. Symptoms began today. EXAM: CT HEAD WITHOUT CONTRAST TECHNIQUE: Contiguous axial images were obtained from the base of the skull through the vertex without intravenous  contrast. COMPARISON:  None. FINDINGS: Brain: The brain shows a normal appearance without evidence of malformation, atrophy, old or acute small or large vessel infarction, mass lesion, hemorrhage, hydrocephalus or extra-axial collection. Vascular: No hyperdense vessel. No evidence of atherosclerotic calcification. Skull: Normal.  No traumatic finding.  No focal bone lesion. Sinuses/Orbits: Sinuses are clear. Orbits appear normal. Mastoids are clear. Other: None significant IMPRESSION: Normal head CT Electronically Signed   By: Nelson Chimes M.D.   On: 09/14/2018 15:21   US Renal  Result Date: 09/13/2018 CLINICAL DATA:  Acute renal failure EXAM: RENAL / URINARY TRACT ULTRASOUND COMPLETE COMPARISON:  CT dated 04/30/2015 FINDINGS: Right Kidney: Renal measurements: 15.8 x 6.6 x 6.4 cm = volume: 349 mL. There is a simple cyst measuring 4.1 cm in the interpolar region. There is a simple appearing cyst in the lower pole measuring 4.4 cm. Left Kidney: Renal measurements: 3.5 x 6 x 5.1 cm = volume: 214 mL. Echogenicity within normal limits. No mass or hydronephrosis visualized. Bladder: Poorly evaluated secondary to the presence of a Foley catheter. IMPRESSION: 1. No acute sonographic abnormality detected.  No hydronephrosis. 2. Decompressed urinary bladder which limits evaluation. Electronically Signed   By: Constance Holster M.D.   On: 09/13/2018 19:19   US Venous Img Lower Bilateral  Result Date: 09/13/2018 CLINICAL DATA:  Leg pain and swelling. EXAM: BILATERAL LOWER EXTREMITY VENOUS DOPPLER ULTRASOUND TECHNIQUE: Gray-scale sonography with graded compression, as well as color Doppler and duplex ultrasound were performed to evaluate the lower extremity deep venous systems from the level of the common femoral vein and including the common femoral, femoral, profunda femoral, popliteal and calf veins including the posterior tibial, peroneal and gastrocnemius veins when visible. The superficial great saphenous vein was  also interrogated. Spectral Doppler was utilized to evaluate flow at rest and with distal augmentation maneuvers in the common femoral, femoral and popliteal veins. COMPARISON:  None. FINDINGS: RIGHT LOWER EXTREMITY Common Femoral Vein: No evidence of thrombus. Normal compressibility, respiratory phasicity and response to augmentation. Saphenofemoral Junction:  No evidence of thrombus. Normal compressibility and flow on color Doppler imaging. Profunda Femoral Vein: No evidence of thrombus. Normal compressibility and flow on color Doppler imaging. Femoral Vein: No evidence of thrombus. Normal compressibility, respiratory phasicity and response to augmentation. Popliteal Vein: No evidence of thrombus. Normal compressibility, respiratory phasicity and response to augmentation. Calf Veins: No evidence of thrombus. Normal compressibility and flow on color Doppler imaging. The peroneal vein was not well visualized. Superficial Great Saphenous Vein: No evidence of thrombus. Normal compressibility. Venous Reflux:  None. Other Findings:  None. LEFT LOWER EXTREMITY Common Femoral Vein: No evidence of thrombus. Normal compressibility, respiratory phasicity and response to augmentation. Saphenofemoral Junction: No evidence of thrombus. Normal compressibility and flow on color Doppler imaging. Profunda Femoral Vein: No evidence of thrombus. Normal compressibility and flow on color Doppler imaging. Femoral Vein: No evidence of thrombus. Normal compressibility, respiratory phasicity and response to augmentation. Popliteal Vein: No evidence of thrombus. Normal compressibility, respiratory phasicity and response to augmentation. Calf Veins: No evidence of thrombus. Normal compressibility and flow on color Doppler imaging. The peroneal vein was not well visualized. Superficial Great Saphenous Vein: No evidence of thrombus. Normal compressibility. Venous Reflux:  None. Other Findings:  None. IMPRESSION: No evidence of deep venous  thrombosis in either lower extremity. The bilateral peroneal veins were not well visualized. Electronically Signed   By: Constance Holster M.D.   On: 09/13/2018 19:17   Dg Chest Port 1 View  Result Date: 09/14/2018 CLINICAL DATA:  Central line placement EXAM: PORTABLE CHEST 1 VIEW COMPARISON:  09/14/2018 FINDINGS: There is a newly placed right-sided central venous catheter with tip terminating over the SVC. There is no pneumothorax. The lung volumes are low. The heart size is enlarged. There are hazy opacities bilaterally with prominent interstitial lung markings. There is a small right-sided pleural effusion. There is likely a trace left-sided pleural effusion. IMPRESSION: 1. Right-sided central venous catheter as above with no pneumothorax. 2. Low lung volumes. 3. Borderline enlarged heart. 4. Prominent interstitial lung markings and a few hazy ground-glass airspace opacities bilaterally are nonspecific but can be seen in patients with pulmonary edema or an atypical infectious process. 5. Developing small right-sided pleural effusion. Electronically Signed   By: Constance Holster M.D.   On: 09/14/2018 13:47   Dg Chest Port 1 View  Result Date: 09/14/2018 CLINICAL DATA:  Dyspnea EXAM: PORTABLE CHEST 1 VIEW COMPARISON:  09/12/2018 chest radiograph. FINDINGS: Low lung volumes. Stable cardiomediastinal silhouette with normal heart size. No pneumothorax. No pleural effusion. No overt pulmonary edema. No acute consolidative airspace disease. IMPRESSION: Low lung volumes, without active cardiopulmonary disease. Electronically Signed   By: Ilona Sorrel M.D.   On: 09/14/2018 10:56    Assessment/Plan:     Active Problems:   Sepsis ([place])   Up with therapy  Staples removed and steri-strips applied Will continue to follow knee exam closely   Lovell Sheehan , MD 09/14/2018, 4:28 PM

## 2018-09-14 NOTE — Progress Notes (Addendum)
During beginning of shift having trouble getting an accurate BP, patient's mental status declining. Isaac Stalker, NP assessed patient and decided to place art line and central line. Levo and vaso started. Head CT completed. After stabilizing patient's BP, mental status began to improve. Currently patient is still on levo and vaso, but sitting up in bed eating dinner. Patient is alert and oriented x4. Will continue to assess. Isaac Cross   1815- tmax 102.1, tylenol given at 1527. Yevonne Aline, NP notified. No new orders, instructed to monitor closely and recheck temp in a couple hours.

## 2018-09-14 NOTE — Care Management Note (Addendum)
Case Management Note  Patient Details  Name: Isaac Cross MRN: [id number] Date of Birth: 02-20-1961  Subjective/Objective:                 Patient admitted for sepsis.  Bilateral knee replacements on 5/15, wife brought patient into the emergency room on 5/26 for not feeling well since surgery with fevers.  Patient admitted to the ICU from the emergency room.  Patient has been hypotensive today, central line and arterial line placed by ICU provider. RNCM will complete assessment at a later time.  Patient is open with [facility] and [place] is aware of admission.   Action/Plan:   Expected Discharge Date:                  Expected Discharge Plan:     In-House Referral:     Discharge planning Services     Post Acute Care Choice:    Choice offered to:     DME Arranged:    DME Agency:     HH Arranged:    HH Agency:     Status of Service:     If discussed at [employer] of Stay Meetings, dates discussed:    Additional Comments:  Shelbie Hutching, RN 09/14/2018, 3:17 PM

## 2018-09-14 NOTE — Progress Notes (Signed)
PT Cancellation Note  Patient Details Name: Isaac Cross MRN: [id number] DOB: 05-Mar-1961   Cancelled Treatment:    Reason Eval/Treat Not Completed: Patient not medically ready.  PT consult received.  Chart reviewed.  Discussed pt with pt's nurse who recommended holding PT at this time d/t BP concerns.  Will re-attempt PT evaluation at a later date/time as medically appropriate.  Leitha Bleak, PT 09/14/18, 10:02 AM [phone]

## 2018-09-14 NOTE — Progress Notes (Signed)
CRITICAL CARE NOTE       SUBJECTIVE FINDINGS & SIGNIFICANT EVENTS   Patient remains critically ill  Had been on Neosynephrine and multiple BP checks with accelerated hypertension punctuated by shock, unclear weather reading was accurate will consider arterial line as patient had significant fluctuations.   PAST MEDICAL HISTORY   Past Medical History:  Diagnosis Date  . Allergy   . Anxiety   . Arthritis    knees  . Asthma, mild intermittent, well-controlled   . Chronic obstructive asthma ([place])   . COPD (chronic obstructive pulmonary disease) ([place])   . Depression with anxiety   . Diabetes ([place])   . GERD (gastroesophageal reflux disease)   . HBP (high blood pressure)   . High cholesterol   . Renal cyst, right   . Sleep apnea    CPAP  . Swelling      SURGICAL HISTORY   Past Surgical History:  Procedure Laterality Date  . BLADDER REPAIR    . COLONOSCOPY WITH PROPOFOL N/A 04/07/2015   Procedure: COLONOSCOPY WITH PROPOFOL;  Surgeon: Lucilla Lame, MD;  Location: [place];  Service: Endoscopy;  Laterality: N/A;  Diabetic - oral meds CPAP  . HAND SURGERY     4TH AND 5TH FINGER  . KNEE ARTHROSCOPY Right   . 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 VEIN SURGERY Right      FAMILY HISTORY   Family History  Problem Relation Age of Onset  . Hypertension Mother   . Diabetes Mother   . Diabetes Father   . Hypertension Father   . Cancer Father   . Seizures Daughter   . Lupus Daughter   . Cancer Maternal Grandfather        Prostate  . Diabetes Paternal Grandfather   . Prostate cancer Maternal Uncle   . Bladder Cancer Neg Hx   . Kidney cancer Neg Hx      SOCIAL HISTORY   Social History   Tobacco Use  . Smoking status: Former Smoker    Packs/day: 1.00    Years:  10.00    Pack years: 10.00    Types: Cigarettes    Start date: 04/20/1995    Last attempt to quit: 04/19/2005    Years since quitting: 13.4  . Smokeless tobacco: Never Used  Substance Use Topics  . Alcohol use: Yes    Alcohol/week: 10.0 standard drinks    Types: 10 Cans of beer per week    Comment: occasional  . Drug use: No     MEDICATIONS   Current Medication:  Current Facility-Administered Medications:  .  acetaminophen (TYLENOL) tablet 650 mg, 650 mg, Oral, Q6H PRN, 650 mg at 09/14/18 0300 **OR** acetaminophen (TYLENOL) suppository 650 mg, 650 mg, Rectal, Q6H PRN, Gladstone Lighter, MD .  albuterol (PROVENTIL) (2.5 MG/3ML) 0.083% nebulizer solution 2.5 mg, 2.5 mg, Nebulization, Q6H, Kalisetti, Radhika, MD, 2.5 mg at 09/14/18 0804 .  aspirin EC tablet 81 mg, 81 mg, Oral, Daily, Gladstone Lighter, MD, 81 mg at 09/13/18 0923 .  atenolol (TENORMIN) tablet 25 mg, 25 mg, Oral, QPM, Kalisetti, Radhika, MD, 25 mg at 09/13/18 1947 .  ceFEPIme (MAXIPIME) 2 g in sodium chloride 0.9 % 100 mL IVPB, 2 g, Intravenous, Q12H, Rocky Morel, RPH, Stopped at 09/13/18 2129 .  Chlorhexidine Gluconate Cloth 2 % PADS 6 each, 6 each, Topical, Q0600, Darel Hong D, NP .  citalopram (CELEXA) tablet 20 mg, 20 mg, Oral, Daily, Gladstone Lighter, MD, 20 mg at  09/13/18 0925 .  docusate sodium (COLACE) capsule 100 mg, 100 mg, Oral, BID, Kalisetti, Radhika, MD, 100 mg at 09/13/18 2106 .  fluticasone furoate-vilanterol (BREO ELLIPTA) 100-25 MCG/INH 1 puff, 1 puff, Inhalation, Daily, Tressia Miners, Radhika, MD, 1 puff at 09/13/18 0926 .  heparin injection 5,000 Units, 5,000 Units, Subcutaneous, Q8H, Gladstone Lighter, MD, 5,000 Units at 09/14/18 0500 .  HYDROcodone-acetaminophen (NORCO/VICODIN) 5-325 MG per tablet 1-2 tablet, 1-2 tablet, Oral, Q4H PRN, Gladstone Lighter, MD, 1 tablet at 09/12/18 2247 .  HYDROcodone-acetaminophen (NORCO/VICODIN) 5-325 MG per tablet 2 tablet, 2 tablet, Oral, Q4H PRN, Ottie Glazier, MD, 2 tablet at 09/14/18 0300 .  insulin aspart (novoLOG) injection 0-5 Units, 0-5 Units, Subcutaneous, QHS, Gladstone Lighter, MD, 2 Units at 09/13/18 2106 .  insulin aspart (novoLOG) injection 0-9 Units, 0-9 Units, Subcutaneous, TID WC, Gladstone Lighter, MD, 3 Units at 09/13/18 1708 .  loratadine (CLARITIN) tablet 10 mg, 10 mg, Oral, Daily, Gladstone Lighter, MD, 10 mg at 09/13/18 0923 .  ondansetron (ZOFRAN) tablet 4 mg, 4 mg, Oral, Q6H PRN **OR** ondansetron (ZOFRAN) injection 4 mg, 4 mg, Intravenous, Q6H PRN, Tressia Miners, Radhika, MD .  phenylephrine (NEO-SYNEPHRINE) 10 mg in sodium chloride 0.9 % 250 mL (0.04 mg/mL) infusion, 0-400 mcg/min, Intravenous, Titrated, Darel Hong D, NP, Last Rate: 30 mL/hr at 09/14/18 0600, 20 mcg/min at 09/14/18 0600 .  rosuvastatin (CRESTOR) tablet 20 mg, 20 mg, Oral, Daily, Gladstone Lighter, MD, 20 mg at 09/13/18 0924 .  sodium bicarbonate 150 mEq in dextrose 5 % 1,000 mL infusion, , Intravenous, Continuous, Darel Hong D, NP, Last Rate: 125 mL/hr at 09/14/18 0600 .  tamsulosin (FLOMAX) capsule 0.4 mg, 0.4 mg, Oral, Daily, Gladstone Lighter, MD, 0.4 mg at 09/13/18 0925 .  vancomycin (VANCOCIN) IVPB 1000 mg/200 mL premix, 1,000 mg, Intravenous, Q24H, Gladstone Lighter, MD, Stopped at 09/13/18 1452    ALLERGIES   Lipitor [atorvastatin]; Other; and Viagra  [sildenafil citrate]    REVIEW OF SYSTEMS    10 point ROS conducted and is negative except as per subjective findings and bilateral knee pain.  PHYSICAL EXAMINATION   Vitals:   09/14/18 0800 09/14/18 0805  BP:    Pulse:    Resp:    Temp: 99 F (37.2 C)   SpO2:  98%    GENERAL: Diaphoretic and acutely ill-appearing HEAD: Normocephalic, atraumatic.  EYES: Pupils equal, round, reactive to light.  No scleral icterus.  MOUTH: Moist mucosal membrane. NECK: Supple. No thyromegaly. No nodules. No JVD.  PULMONARY: Crackles at the bases bilaterally CARDIOVASCULAR: S1 and S2.  Regular rate and rhythm. No murmurs, rubs, or gallops.  GASTROINTESTINAL: Soft, nontender, non-distended. No masses. Positive bowel sounds. No hepatosplenomegaly.  MUSCULOSKELETAL: No swelling, clubbing, or edema.  NEUROLOGIC: Mild distress due to acute illness SKIN:intact,warm,dry   LABS AND IMAGING       LAB RESULTS: Recent Labs  Lab 09/12/18 0955 09/13/18 0344 09/14/18 0336  NA 126* 126* 126*  K 4.4 4.1 3.8  CL 90* 96* 92*  CO2 17* 18* 24  BUN 28* 34* 29*  CREATININE 3.71* 4.31* 3.50*  GLUCOSE 129* 196* 247*   Recent Labs  Lab 09/12/18 0955 09/13/18 0344 09/14/18 0336  HGB 11.5* 8.7* 8.0*  HCT 35.7* 26.7* 24.5*  WBC 16.1* 9.4 5.9  PLT 137* 137* 144*     IMAGING RESULTS: US Renal  Result Date: 09/13/2018 CLINICAL DATA:  Acute renal failure EXAM: RENAL / URINARY TRACT ULTRASOUND COMPLETE COMPARISON:  CT dated 04/30/2015 FINDINGS: Right Kidney: Renal measurements:  15.8 x 6.6 x 6.4 cm = volume: 349 mL. There is a simple cyst measuring 4.1 cm in the interpolar region. There is a simple appearing cyst in the lower pole measuring 4.4 cm. Left Kidney: Renal measurements: 3.5 x 6 x 5.1 cm = volume: 214 mL. Echogenicity within normal limits. No mass or hydronephrosis visualized. Bladder: Poorly evaluated secondary to the presence of a Foley catheter. IMPRESSION: 1. No acute sonographic abnormality detected.  No hydronephrosis. 2. Decompressed urinary bladder which limits evaluation. Electronically Signed   By: Constance Holster M.D.   On: 09/13/2018 19:19   US Venous Img Lower Bilateral  Result Date: 09/13/2018 CLINICAL DATA:  Leg pain and swelling. EXAM: BILATERAL LOWER EXTREMITY VENOUS DOPPLER ULTRASOUND TECHNIQUE: Gray-scale sonography with graded compression, as well as color Doppler and duplex ultrasound were performed to evaluate the lower extremity deep venous systems from the level of the common femoral vein and including the common femoral, femoral, profunda femoral,  popliteal and calf veins including the posterior tibial, peroneal and gastrocnemius veins when visible. The superficial great saphenous vein was also interrogated. Spectral Doppler was utilized to evaluate flow at rest and with distal augmentation maneuvers in the common femoral, femoral and popliteal veins. COMPARISON:  None. FINDINGS: RIGHT LOWER EXTREMITY Common Femoral Vein: No evidence of thrombus. Normal compressibility, respiratory phasicity and response to augmentation. Saphenofemoral Junction: No evidence of thrombus. Normal compressibility and flow on color Doppler imaging. Profunda Femoral Vein: No evidence of thrombus. Normal compressibility and flow on color Doppler imaging. Femoral Vein: No evidence of thrombus. Normal compressibility, respiratory phasicity and response to augmentation. Popliteal Vein: No evidence of thrombus. Normal compressibility, respiratory phasicity and response to augmentation. Calf Veins: No evidence of thrombus. Normal compressibility and flow on color Doppler imaging. The peroneal vein was not well visualized. Superficial Great Saphenous Vein: No evidence of thrombus. Normal compressibility. Venous Reflux:  None. Other Findings:  None. LEFT LOWER EXTREMITY Common Femoral Vein: No evidence of thrombus. Normal compressibility, respiratory phasicity and response to augmentation. Saphenofemoral Junction: No evidence of thrombus. Normal compressibility and flow on color Doppler imaging. Profunda Femoral Vein: No evidence of thrombus. Normal compressibility and flow on color Doppler imaging. Femoral Vein: No evidence of thrombus. Normal compressibility, respiratory phasicity and response to augmentation. Popliteal Vein: No evidence of thrombus. Normal compressibility, respiratory phasicity and response to augmentation. Calf Veins: No evidence of thrombus. Normal compressibility and flow on color Doppler imaging. The peroneal vein was not well visualized. Superficial Great Saphenous  Vein: No evidence of thrombus. Normal compressibility. Venous Reflux:  None. Other Findings:  None. IMPRESSION: No evidence of deep venous thrombosis in either lower extremity. The bilateral peroneal veins were not well visualized. Electronically Signed   By: Constance Holster M.D.   On: 09/13/2018 19:17      ASSESSMENT AND PLAN     -Multidisciplinary rounds held today   Septic shock   -Possibly due to urinary tract infection with severe AKI stage IV -concern for possible PJI of bilateral knees as they are hot to touch, swollen/tender, patient with fevers, chills hypotension  - on Vancomycing and cefepime IV -pt likely needs joint fluid studies to evalutae for PJI  - will place ID consult - appreciate input - orthopedics on case- appreciate input -follow ABG and LA -follow up cultures Leukocytosis improved on antibiotics    Acute blood loss anemia     -Baseline hemoglobin 13.5-8.0 now- partially dilutional     -No obvious source of grossly appreciable bleeding     -  3 BMs yesteday - will send for occult blood next BM    -Monitor H&H     - will consider DIC - fibrinogen labs for now     - pathology slide consultation placed    Acute kidney injury stage IV    -DC nonessential nephrotoxic meds   -Nephrology consultation- GFR improved overnight  -Status post IV fluid rehydration -net positive 8 L -Developing crackles at the bases- cxr pending    Diabetes mellitus with uncontrolled hyperglycemia   -On insulin sliding scale AC at bedtime   -Starting nightly long-acting Lantus 8 units subcu for now    ID -continue IV abx as prescibed -follow up cultures  GI/Nutrition GI PROPHYLAXIS as indicated DIET-->TF's as tolerated Constipation protocol as indicated  ENDO - ICU hypoglycemic\Hyperglycemia protocol -check FSBS per protocol   ELECTROLYTES -follow labs as needed -replace as needed -pharmacy consultation   DVT/GI PRX ordered -SCDs  TRANSFUSIONS  AS NEEDED MONITOR FSBS ASSESS the need for LABS as needed   Critical care provider statement:   Critical care time (minutes): 33  Critical care time was exclusive of: Separately billable procedures and treating other patients  Critical care was necessary to treat or prevent imminent or life-threatening deterioration of the following conditions:  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  I assumed direction of critical care for this patient from another provider in my specialty: no     Ottie Glazier, M.D.  Division of [facility]

## 2018-09-14 NOTE — Progress Notes (Signed)
Date of Admission:  09/12/2018      Isaac Cross is a 58 y.o. male male with a history of COPD, diabetes mellitus, hypertension, GERD, recent bilateral knee replacement on Sep 01, 2018 at [facility] by Dr. Harlow Mares presents to the ED with dizziness of 2 days duration, fever of more than 5 days  duration.   In the ED on 09/12/2018 his temperature was 102.7, heart rate of 110 and BP of 147/101.  WBC was 16.1, hemoglobin of 11.5, platelet of 137, creatinine of 3.71 and lactate of 4.9 and pro calcitonin of 6.97.  Blood cultures were sent.  He was started on vancomycin and cefepime. Patient has been admitted to the ICU . He was also seen by r nephrologist and medication with metformin, lisinopril and trimethoprim sulfamethoxazole was on hold.  He was also started on IV fluids with sodium bicarbonate infusion. As per his wife pt had surgery, on 5/15 and came home on 5/17  He apparently was not urinating well and had increased frequency and his PCP did a virtual visit questioned possible UTI due to recent Foley placement and gave him Flomax .He felt better after that. and started PT on 5/19 .and was doing well for a few days- he then started sweating and a had a fever and wife called 911 and they came and assessed him and did not feel he needed hospitalization. On Saturday 09/09/2018 the wife called [facility] as his temp was 102-103 and the on-call physician Dr. Sabra Heck prescribed Bactrim and Keflex .  He became progressively weak and fatigued.  And on Tuesday morning he was found to have rolled off his bed and kind of kneeling on his left knee and his wife with the help of her son and daughter brought the patient to the car and brought him to the emergency room on 09/12/2018.  As per his wife he was also constipated for a few days and stopped taking the Percocet and then had a bowel movement before his hospitalization. Patient denies cough, shortness of breath or chest pain.  He says his legs have always been swollen.   He does have more pain in his left knee.  Subjective: He is feeling better Says his leg is hurting less   Medications:  . albuterol  2.5 mg Nebulization Q6H  . aspirin EC  81 mg Oral Daily  . Chlorhexidine Gluconate Cloth  6 each Topical Q0600  . citalopram  20 mg Oral Daily  . docusate sodium  100 mg Oral BID  . fluticasone furoate-vilanterol  1 puff Inhalation Daily  . heparin  5,000 Units Subcutaneous Q8H  . insulin aspart  0-5 Units Subcutaneous QHS  . insulin aspart  0-9 Units Subcutaneous TID WC  . insulin glargine  8 Units Subcutaneous QHS  . loratadine  10 mg Oral Daily  . rosuvastatin  20 mg Oral Daily    Objective: Vital signs in last 24 hours: Temp:  [99 F (37.2 C)-103.3 F (39.6 C)] 99 F (37.2 C) (05/28 0800) Pulse Rate:  [39-136] 88 (05/28 0930) Resp:  [13-33] 21 (05/28 0930) BP: (61-139)/(26-123) 102/77 (05/28 0930) SpO2:  [69 %-100 %] 91 % (05/28 0930)  PHYSICAL EXAM:  General: Alert, cooperative, no distress,  Head: Normocephalic, without obvious abnormality, atraumatic. Eyes: Conjunctivae clear, anicteric sclerae. Pupils are equal ENT Nares normal. No drainage or sinus tenderness. Lips, mucosa, and tongue normal. No Thrush Neck: Supple, symmetrical, no adenopathy, thyroid: non tender no carotid bruit and no JVD.  Back: No CVA tenderness. Lungs:b/l air entry Heart: s1s2 Abdomen: Soft, non-tender,not distended. Bowel sounds normal. No masses, foley catheter Extremities: rt femoral arterial line  b/l TKA Staples being removed by Dr.Bowers, no erythema of the knees Swollen knees and legs Bruising left leg Skin: No rashes or lesions. Or bruising Lymph: Cervical, supraclavicular normal. Neurologic: Grossly non-focal  Lab Results CBC Latest Ref Rng & Units 09/14/2018 09/13/2018 09/12/2018  WBC 4.0 - 10.5 K/uL 5.9 9.4 16.1(H)  Hemoglobin 13.0 - 17.0 g/dL 8.0(L) 8.7(L) 11.5(L)  Hematocrit 39.0 - 52.0 % 24.5(L) 26.7(L) 35.7(L)  Platelets 150 - 400 K/uL  144(L) 137(L) 137(L)   Recent Labs    09/13/18 0344 09/14/18 0336  WBC 9.4 5.9  HGB 8.7* 8.0*  HCT 26.7* 24.5*  NA 126* 126*  K 4.1 3.8  CL 96* 92*  CO2 18* 24  BUN 34* 29*  CREATININE 4.31* 3.50*   Liver Panel Recent Labs    09/12/18 0955  PROT 7.6  ALBUMIN 3.0*  AST 70*  ALT 42  ALKPHOS 86  BILITOT 0.7   Sedimentation Rate Recent Labs    09/12/18 0956  ESRSEDRATE 68*   Microbiology: UC, BC Ng Studies/Results: US Renal  Result Date: 09/13/2018 CLINICAL DATA:  Acute renal failure EXAM: RENAL / URINARY TRACT ULTRASOUND COMPLETE COMPARISON:  CT dated 04/30/2015 FINDINGS: Right Kidney: Renal measurements: 15.8 x 6.6 x 6.4 cm = volume: 349 mL. There is a simple cyst measuring 4.1 cm in the interpolar region. There is a simple appearing cyst in the lower pole measuring 4.4 cm. Left Kidney: Renal measurements: 3.5 x 6 x 5.1 cm = volume: 214 mL. Echogenicity within normal limits. No mass or hydronephrosis visualized. Bladder: Poorly evaluated secondary to the presence of a Foley catheter. IMPRESSION: 1. No acute sonographic abnormality detected.  No hydronephrosis. 2. Decompressed urinary bladder which limits evaluation. Electronically Signed   By: Constance Holster M.D.   On: 09/13/2018 19:19   US Venous Img Lower Bilateral  Result Date: 09/13/2018 CLINICAL DATA:  Leg pain and swelling. EXAM: BILATERAL LOWER EXTREMITY VENOUS DOPPLER ULTRASOUND TECHNIQUE: Gray-scale sonography with graded compression, as well as color Doppler and duplex ultrasound were performed to evaluate the lower extremity deep venous systems from the level of the common femoral vein and including the common femoral, femoral, profunda femoral, popliteal and calf veins including the posterior tibial, peroneal and gastrocnemius veins when visible. The superficial great saphenous vein was also interrogated. Spectral Doppler was utilized to evaluate flow at rest and with distal augmentation maneuvers in the common  femoral, femoral and popliteal veins. COMPARISON:  None. FINDINGS: RIGHT LOWER EXTREMITY Common Femoral Vein: No evidence of thrombus. Normal compressibility, respiratory phasicity and response to augmentation. Saphenofemoral Junction: No evidence of thrombus. Normal compressibility and flow on color Doppler imaging. Profunda Femoral Vein: No evidence of thrombus. Normal compressibility and flow on color Doppler imaging. Femoral Vein: No evidence of thrombus. Normal compressibility, respiratory phasicity and response to augmentation. Popliteal Vein: No evidence of thrombus. Normal compressibility, respiratory phasicity and response to augmentation. Calf Veins: No evidence of thrombus. Normal compressibility and flow on color Doppler imaging. The peroneal vein was not well visualized. Superficial Great Saphenous Vein: No evidence of thrombus. Normal compressibility. Venous Reflux:  None. Other Findings:  None. LEFT LOWER EXTREMITY Common Femoral Vein: No evidence of thrombus. Normal compressibility, respiratory phasicity and response to augmentation. Saphenofemoral Junction: No evidence of thrombus. Normal compressibility and flow on color Doppler imaging. Profunda Femoral Vein: No evidence of  thrombus. Normal compressibility and flow on color Doppler imaging. Femoral Vein: No evidence of thrombus. Normal compressibility, respiratory phasicity and response to augmentation. Popliteal Vein: No evidence of thrombus. Normal compressibility, respiratory phasicity and response to augmentation. Calf Veins: No evidence of thrombus. Normal compressibility and flow on color Doppler imaging. The peroneal vein was not well visualized. Superficial Great Saphenous Vein: No evidence of thrombus. Normal compressibility. Venous Reflux:  None. Other Findings:  None. IMPRESSION: No evidence of deep venous thrombosis in either lower extremity. The bilateral peroneal veins were not well visualized. Electronically Signed   By: Constance Holster M.D.   On: 09/13/2018 19:17   Dg Chest Port 1 View  Result Date: 09/14/2018 CLINICAL DATA:  Dyspnea EXAM: PORTABLE CHEST 1 VIEW COMPARISON:  09/12/2018 chest radiograph. FINDINGS: Low lung volumes. Stable cardiomediastinal silhouette with normal heart size. No pneumothorax. No pleural effusion. No overt pulmonary edema. No acute consolidative airspace disease. IMPRESSION: Low lung volumes, without active cardiopulmonary disease. Electronically Signed   By: Ilona Sorrel M.D.   On: 09/14/2018 10:56     Assessment/Plan: Fever with features of sepsis in a patient who recently had bilateral knee replacement.  The differential diagnosis for the fever is urinary tract versus knee joint.  As he had frequency of micturition , and UA shows 6-10 WBC UTI is likely.  As he was on Bactrim and Keflex the urine culture may be negative.  He is currently on Vanco and cefepime.  But what is concerning is in spite of the IV antibiotics he has had fever 24 hours after hospitalization.  If this was a UTI and Bactrim had made the urine sterile then cefepime should definitely work on this patient.  So prosthetic knee joint infection will have to be considered.  The surgical site looks okay with no evidence of superficial infection.  If he continues to have fever the knee joint will have to be aspirated and sent for cell count, crystals and culture. Discussed with Dr.Bowers  AKI could be multifactorial in etiology.  Infection vn versus medication.  He has not taken any NSAID .  Seen by nephrologist.  Metformin lisinopril and Bactrim have been stopped.  ?ultrasound of the kidneys no hydronephrosis.  CPK. 4000    Bilateral knee joint replacement on 09/01/2018.  The knees are swollen.  Tenderness on movement especially the left knee.  This is could be a normal postop finding or he could have hematoma secondary to Xarelto.   Diabetes mellitus was on metformin.  Now on insulin.  Discussed the management with Dr.Bowers

## 2018-09-14 NOTE — Progress Notes (Addendum)
[place] [facility] [place] NAME: Isaac Cross    MR#:  [id number]  DATE OF BIRTH:  [birth year]  SUBJECTIVE:  patient came in with dizziness and weakness. Continues to have fever. Found to have acute renal failure.  REVIEW OF SYSTEMS:   Review of Systems  Constitutional: Negative for chills, fever and weight loss.  HENT: Negative for ear discharge, ear pain and nosebleeds.   Eyes: Negative for blurred vision, pain and discharge.  Respiratory: Negative for sputum production, shortness of breath, wheezing and stridor.   Cardiovascular: Negative for chest pain, palpitations, orthopnea and PND.  Gastrointestinal: Positive for nausea. Negative for abdominal pain, diarrhea and vomiting.  Genitourinary: Negative for frequency and urgency.  Musculoskeletal: Positive for joint pain. Negative for back pain.  Neurological: Positive for weakness. Negative for sensory change, speech change and focal weakness.  Psychiatric/Behavioral: Negative for depression and hallucinations. The patient is not nervous/anxious.    Tolerating Diet:yes Tolerating PT: pending  DRUG ALLERGIES:   Allergies  Allergen Reactions  . Lipitor [Atorvastatin] Hives and Itching  . Other   . Viagra  [Sildenafil Citrate]     vision loss    VITALS:  Blood pressure 102/77, pulse 89, temperature 99.6 F (37.6 C), temperature source Oral, resp. rate (!) 24, height 6\' 4"  (1.93 m), weight (!) 145.2 kg, SpO2 91 %.  PHYSICAL EXAMINATION:   Physical Exam  GENERAL:  58 y.o.-year-old patient lying in the bed with no acute distress.  EYES: Pupils equal, round, reactive to light and accommodation. No scleral icterus. Extraocular muscles intact.  HEENT: Head atraumatic, normocephalic. Oropharynx and nasopharynx clear.  NECK:  Supple, no jugular venous distention. No thyroid enlargement, no tenderness.  LUNGS: Normal breath sounds bilaterally, no wheezing, rales, rhonchi. No use of  accessory muscles of respiration.  CARDIOVASCULAR: S1, S2 normal. No murmurs, rubs, or gallops.  ABDOMEN: Soft, nontender, nondistended. Bowel sounds present. No organomegaly or mass.  EXTREMITIES: bilateral knee joint Staples present. Skin looks normal. No drainage from the incision site.  NEUROLOGIC: Cranial nerves II through XII are intact. No focal Motor or sensory deficits b/l.   PSYCHIATRIC:  patient is alert and oriented x 3.  SKIN: No obvious rash, lesion, or ulcer.   LABORATORY PANEL:  CBC Recent Labs  Lab 09/14/18 0336  WBC 5.9  HGB 8.0*  HCT 24.5*  PLT 144*    Chemistries  Recent Labs  Lab 09/12/18 0955  09/14/18 0336  NA 126*   < > 126*  K 4.4   < > 3.8  CL 90*   < > 92*  CO2 17*   < > 24  GLUCOSE 129*   < > 247*  BUN 28*   < > 29*  CREATININE 3.71*   < > 3.50*  CALCIUM 9.7   < > 7.8*  AST 70*  --   --   ALT 42  --   --   ALKPHOS 86  --   --   BILITOT 0.7  --   --    < > = values in this interval not displayed.   Cardiac Enzymes No results for input(s): TROPONINI in the last 168 hours. RADIOLOGY:  US Renal  Result Date: 09/13/2018 CLINICAL DATA:  Acute renal failure EXAM: RENAL / URINARY TRACT ULTRASOUND COMPLETE COMPARISON:  CT dated 04/30/2015 FINDINGS: Right Kidney: Renal measurements: 15.8 x 6.6 x 6.4 cm = volume: 349 mL. There is a simple cyst measuring 4.1 cm in  the interpolar region. There is a simple appearing cyst in the lower pole measuring 4.4 cm. Left Kidney: Renal measurements: 3.5 x 6 x 5.1 cm = volume: 214 mL. Echogenicity within normal limits. No mass or hydronephrosis visualized. Bladder: Poorly evaluated secondary to the presence of a Foley catheter. IMPRESSION: 1. No acute sonographic abnormality detected.  No hydronephrosis. 2. Decompressed urinary bladder which limits evaluation. Electronically Signed   By: Constance Holster M.D.   On: 09/13/2018 19:19   US Venous Img Lower Bilateral  Result Date: 09/13/2018 CLINICAL DATA:  Leg pain and  swelling. EXAM: BILATERAL LOWER EXTREMITY VENOUS DOPPLER ULTRASOUND TECHNIQUE: Gray-scale sonography with graded compression, as well as color Doppler and duplex ultrasound were performed to evaluate the lower extremity deep venous systems from the level of the common femoral vein and including the common femoral, femoral, profunda femoral, popliteal and calf veins including the posterior tibial, peroneal and gastrocnemius veins when visible. The superficial great saphenous vein was also interrogated. Spectral Doppler was utilized to evaluate flow at rest and with distal augmentation maneuvers in the common femoral, femoral and popliteal veins. COMPARISON:  None. FINDINGS: RIGHT LOWER EXTREMITY Common Femoral Vein: No evidence of thrombus. Normal compressibility, respiratory phasicity and response to augmentation. Saphenofemoral Junction: No evidence of thrombus. Normal compressibility and flow on color Doppler imaging. Profunda Femoral Vein: No evidence of thrombus. Normal compressibility and flow on color Doppler imaging. Femoral Vein: No evidence of thrombus. Normal compressibility, respiratory phasicity and response to augmentation. Popliteal Vein: No evidence of thrombus. Normal compressibility, respiratory phasicity and response to augmentation. Calf Veins: No evidence of thrombus. Normal compressibility and flow on color Doppler imaging. The peroneal vein was not well visualized. Superficial Great Saphenous Vein: No evidence of thrombus. Normal compressibility. Venous Reflux:  None. Other Findings:  None. LEFT LOWER EXTREMITY Common Femoral Vein: No evidence of thrombus. Normal compressibility, respiratory phasicity and response to augmentation. Saphenofemoral Junction: No evidence of thrombus. Normal compressibility and flow on color Doppler imaging. Profunda Femoral Vein: No evidence of thrombus. Normal compressibility and flow on color Doppler imaging. Femoral Vein: No evidence of thrombus. Normal  compressibility, respiratory phasicity and response to augmentation. Popliteal Vein: No evidence of thrombus. Normal compressibility, respiratory phasicity and response to augmentation. Calf Veins: No evidence of thrombus. Normal compressibility and flow on color Doppler imaging. The peroneal vein was not well visualized. Superficial Great Saphenous Vein: No evidence of thrombus. Normal compressibility. Venous Reflux:  None. Other Findings:  None. IMPRESSION: No evidence of deep venous thrombosis in either lower extremity. The bilateral peroneal veins were not well visualized. Electronically Signed   By: Constance Holster M.D.   On: 09/13/2018 19:17   Dg Chest Port 1 View  Result Date: 09/14/2018 CLINICAL DATA:  Central line placement EXAM: PORTABLE CHEST 1 VIEW COMPARISON:  09/14/2018 FINDINGS: There is a newly placed right-sided central venous catheter with tip terminating over the SVC. There is no pneumothorax. The lung volumes are low. The heart size is enlarged. There are hazy opacities bilaterally with prominent interstitial lung markings. There is a small right-sided pleural effusion. There is likely a trace left-sided pleural effusion. IMPRESSION: 1. Right-sided central venous catheter as above with no pneumothorax. 2. Low lung volumes. 3. Borderline enlarged heart. 4. Prominent interstitial lung markings and a few hazy ground-glass airspace opacities bilaterally are nonspecific but can be seen in patients with pulmonary edema or an atypical infectious process. 5. Developing small right-sided pleural effusion. Electronically Signed   By: Jamie Kato.D.  On: 09/14/2018 13:47   Dg Chest Port 1 View  Result Date: 09/14/2018 CLINICAL DATA:  Dyspnea EXAM: PORTABLE CHEST 1 VIEW COMPARISON:  09/12/2018 chest radiograph. FINDINGS: Low lung volumes. Stable cardiomediastinal silhouette with normal heart size. No pneumothorax. No pleural effusion. No overt pulmonary edema. No acute consolidative  airspace disease. IMPRESSION: Low lung volumes, without active cardiopulmonary disease. Electronically Signed   By: Ilona Sorrel M.D.   On: 09/14/2018 10:56   ASSESSMENT AND PLAN:   Isaac Cross  is a 58 y.o. male with a known history of arthritis, COPD not on home oxygen, hypertension, sleep apnea, non-insulin-dependent diabetes mellitus presents to hospital secondary to dizziness, weakness and a fall this morning.  1.Sepsis-source? unclear -Bilateral knee staples in place with no signs of infection.  - Ortho has been consult-- Dr. Harlow Mares noted -Blood cultures negative - urine cultures negative -MRSA negative -cont on vancomycin and cefepime--vanc d/ced -Procalcitonin is elevated.   -IV fluids-- with bicarb  2.  Acute renal failure-likely ATN from sepsis/Bactrim -creat 3.7--4.31--3.50 -IV bicarb gtt -  Minimal urine output.   -Renal ultrasound ordered. - Nephrology  Consult noted - Serological work-up has been ordered as well. -Hold lisinopril and metformin and other nephrotoxins.  3.  Bilateral knee replacement surgery-bilateral knee surgery done about 10 days ago.   -X-ray showing soft tissue swelling, patient had a fall earlier today onto his knees. -Orthopedics input noted  Other than minimal swelling.  4.  Hypertension-hold lisinopril.  5.  Diabetes mellitus-we will start sliding scale insulin.  - Hold metformin given worsening renal failure.  6.  DVT prophylaxis- SCD   CODE STATUS: full  DVT Prophylaxis:SCD  TOTAL TIME TAKING CARE OF THIS PATIENT: 30* minutes.  >50% time spent on counselling and coordination of care  POSSIBLE D/C IN **?* DAYS, DEPENDING ON CLINICAL CONDITION.  Note: This dictation was prepared with Dragon dictation along with smaller phrase technology. Any transcriptional errors that result from this process are unintentional.  Fritzi Mandes M.D on 09/14/2018 at 3:18 PM  Between 7am to 6pm - Pager - [phone]  After 6pm go to  [email or web address] - password [email or web address] [place] [place] [facility]  Office  [phone]  CC: Primary care physician; Steele Sizer, MDPatient ID: Isaac Cross, male   DOB: Apr 19, 1961, 58 y.o.   MRN: [id number]

## 2018-09-14 NOTE — Progress Notes (Signed)
Pharmacy Antibiotic Note  Isaac Cross is a 58 y.o. male admitted on 09/12/2018 with septic arthritis?.  Pharmacy has been consulted for Vancomycin dosing. Patient s/p bilateral TKR 09/01/2018 CTX x 1 and Cipro x 1 in ER  Plan: Patient received Vancomycin 2000 mg IV x 1 in ER. Scr 3.71  Crcl 33 ml/min  (note Scr from 06/12/2018 = 1.07) Will continue with: Vancomycin 1000 mg IV Q 24 hrs. Goal AUC 400-550. Expected AUC: 531 SCr used: 3.71         BMI 38    *Watch Renal fxn*  Continue Cefepime 2g IV q12h    Height: 6\' 4"  (193 cm) Weight: (!) 320 lb (145.2 kg) IBW/kg (Calculated) : 86.8  Temp (24hrs), Avg:101.1 F (38.4 C), Min:99 F (37.2 C), Max:103.3 F (39.6 C)  Recent Labs  Lab 09/12/18 0955 09/12/18 1301 09/12/18 1823 09/12/18 2234 09/13/18 0344 09/14/18 0336  WBC 16.1*  --   --   --  9.4 5.9  CREATININE 3.71*  --   --   --  4.31* 3.50*  LATICACIDVEN 4.9* 2.0* 1.4 1.2  --   --     Estimated Creatinine Clearance: 35 mL/min (A) (by C-G formula based on SCr of 3.5 mg/dL (H)).    Allergies  Allergen Reactions  . Lipitor [Atorvastatin] Hives and Itching  . Other   . Viagra  [Sildenafil Citrate]     vision loss    Antimicrobials this admission: cipro 5/26 >> 5/26 CTX 5/26 >>  5/26 Vanc 5/26 >> Cefepime 5/26 >>  Dose adjustments this admission:    Microbiology results: 5/26 BCx: pending 5/26 UCx:  pending   Sputum:      MRSA PCR:    Thank you for allowing pharmacy to be a part of this patient's care.  Paulina Fusi, PharmD, BCPS 09/14/2018 5:03 PM

## 2018-09-14 NOTE — Procedures (Signed)
Arterial Catheter Insertion Procedure Note Isaac Cross [id number] 06-26-1960  Procedure: Insertion of Arterial Catheter  Indications: Blood pressure monitoring and Frequent blood sampling  Procedure Details Consent: Risks of procedure as well as the alternatives and risks of each were explained to the (patient/caregiver).  Consent for procedure obtained. Time Out: Verified patient identification, verified procedure, site/side was marked, verified correct patient position, special equipment/implants available, medications/allergies/relevent history reviewed, required imaging and test results available.  Performed  Maximum sterile technique was used including antiseptics, cap, gloves, gown, hand hygiene, mask and sheet. Skin prep: Chlorhexidine; local anesthetic administered 20 gauge catheter was inserted into right femoral artery using the Seldinger technique. ULTRASOUND GUIDANCE USED: YES Evaluation Blood flow good; BP tracing good. Complications: No apparent complications  .    Marda Stalker, [place] Pager [phone] (please enter 7 digits) PCCM Consult Pager [phone] (please enter 7 digits)

## 2018-09-14 NOTE — Progress Notes (Signed)
Inpatient Diabetes Program Recommendations  AACE/ADA: New Consensus Statement on Inpatient Glycemic Control (2015)  Target Ranges:  Prepandial:   less than 140 mg/dL      Peak postprandial:   less than 180 mg/dL (1-2 hours)      Critically ill patients:  140 - 180 mg/dL   Results for Isaac Cross, Isaac Cross (MRN [id number]) as of 09/14/2018 09:17  Ref. Range 09/13/2018 07:49 09/13/2018 11:46 09/13/2018 16:45 09/13/2018 21:00  Glucose-Capillary Latest Ref Range: 70 - 99 mg/dL 199 (H)  2 units NOVOLOG  191 (H)  2 units NOVOLOG  246 (H)  3 units NOVOLOG   237 (H)  2 units NOVOLOG    Results for Isaac Cross, Isaac Cross (MRN [id number]) as of 09/14/2018 09:17  Ref. Range 09/14/2018 07:57  Glucose-Capillary Latest Ref Range: 70 - 99 mg/dL 247 (H)  3 units NOVOLOG    Results for Isaac Cross, Isaac Cross (MRN [id number]) as of 09/14/2018 09:17  Ref. Range 09/12/2018 09:55  Hemoglobin A1C Latest Ref Range: 4.8 - 5.6 % 6.9 (H)    Home DM Meds: Metformin 1000 mg BID  Current Orders: Novolog Sensitive Correction Scale/ SSI (0-9 units) TID AC + HS    Well controlled Diabetes at home as evidenced by Current A1c of 6.9%.     MD- Note home Metformin on hold.  Please consider adding Lantus to current inpatient insulin regimen to help reduce glucose levels.  Recommend Lantus 14 units Daily (0.1 units/kg dosing based on weight of 145 kg)    --Will follow patient during hospitalization--  Wyn Quaker RN, MSN, CDE Diabetes Coordinator Inpatient Glycemic Control Team Team Pager: [phone] (8a-5p)

## 2018-09-15 ENCOUNTER — Inpatient Hospital Stay: Payer: BC Managed Care – PPO

## 2018-09-15 LAB — CBC WITH DIFFERENTIAL/PLATELET
Abs Immature Granulocytes: 0.08 10*3/uL — ABNORMAL HIGH (ref 0.00–0.07)
Basophils Absolute: 0 10*3/uL (ref 0.0–0.1)
Basophils Relative: 1 %
Eosinophils Absolute: 0.2 10*3/uL (ref 0.0–0.5)
Eosinophils Relative: 4 %
HCT: 23.2 % — ABNORMAL LOW (ref 39.0–52.0)
Hemoglobin: 7.5 g/dL — ABNORMAL LOW (ref 13.0–17.0)
Immature Granulocytes: 1 %
Lymphocytes Relative: 22 %
Lymphs Abs: 1.2 10*3/uL (ref 0.7–4.0)
MCH: 26.5 pg (ref 26.0–34.0)
MCHC: 32.3 g/dL (ref 30.0–36.0)
MCV: 82 fL (ref 80.0–100.0)
Monocytes Absolute: 1 10*3/uL (ref 0.1–1.0)
Monocytes Relative: 18 %
Neutro Abs: 3 10*3/uL (ref 1.7–7.7)
Neutrophils Relative %: 54 %
Platelets: 158 10*3/uL (ref 150–400)
RBC: 2.83 MIL/uL — ABNORMAL LOW (ref 4.22–5.81)
RDW: 14.6 % (ref 11.5–15.5)
WBC: 5.5 10*3/uL (ref 4.0–10.5)
nRBC: 0 % (ref 0.0–0.2)

## 2018-09-15 LAB — HEMOGLOBIN AND HEMATOCRIT, BLOOD
HCT: 17.4 % — ABNORMAL LOW (ref 39.0–52.0)
HCT: 28.3 % — ABNORMAL LOW (ref 39.0–52.0)
HCT: 28.4 % — ABNORMAL LOW (ref 39.0–52.0)
Hemoglobin: 5.7 g/dL — ABNORMAL LOW (ref 13.0–17.0)
Hemoglobin: 9.3 g/dL — ABNORMAL LOW (ref 13.0–17.0)
Hemoglobin: 9.4 g/dL — ABNORMAL LOW (ref 13.0–17.0)

## 2018-09-15 LAB — COMPREHENSIVE METABOLIC PANEL
ALT: 57 U/L — ABNORMAL HIGH (ref 0–44)
AST: 148 U/L — ABNORMAL HIGH (ref 15–41)
Albumin: 1.9 g/dL — ABNORMAL LOW (ref 3.5–5.0)
Alkaline Phosphatase: 70 U/L (ref 38–126)
Anion gap: 15 (ref 5–15)
BUN: 27 mg/dL — ABNORMAL HIGH (ref 6–20)
CO2: 22 mmol/L (ref 22–32)
Calcium: 8.3 mg/dL — ABNORMAL LOW (ref 8.9–10.3)
Chloride: 90 mmol/L — ABNORMAL LOW (ref 98–111)
Creatinine, Ser: 2.82 mg/dL — ABNORMAL HIGH (ref 0.61–1.24)
GFR calc Af Amer: 27 mL/min — ABNORMAL LOW (ref >60)
GFR calc non Af Amer: 23 mL/min — ABNORMAL LOW (ref >60)
Glucose, Bld: 213 mg/dL — ABNORMAL HIGH (ref 70–99)
Potassium: 3.6 mmol/L (ref 3.5–5.1)
Sodium: 127 mmol/L — ABNORMAL LOW (ref 135–145)
Total Bilirubin: 0.7 mg/dL (ref 0.3–1.2)
Total Protein: 5.8 g/dL — ABNORMAL LOW (ref 6.5–8.1)

## 2018-09-15 LAB — SYNOVIAL CELL COUNT + DIFF, W/ CRYSTALS
Crystals, Fluid: NONE SEEN
Eosinophils-Synovial: 0 %
Lymphocytes-Synovial Fld: 7 %
Monocyte-Macrophage-Synovial Fluid: 1 %
Neutrophil, Synovial: 92 %
WBC, Synovial: 9690 /mm3 — ABNORMAL HIGH (ref 0–200)

## 2018-09-15 LAB — GLUCOSE, CAPILLARY
Glucose-Capillary: 209 mg/dL — ABNORMAL HIGH (ref 70–99)
Glucose-Capillary: 250 mg/dL — ABNORMAL HIGH (ref 70–99)
Glucose-Capillary: 243 mg/dL — ABNORMAL HIGH (ref 70–99)
Glucose-Capillary: 187 mg/dL — ABNORMAL HIGH (ref 70–99)

## 2018-09-15 LAB — PROCALCITONIN: Procalcitonin: 5.2 ng/mL

## 2018-09-15 LAB — PROTIME-INR
INR: 1.1 (ref 0.8–1.2)
Prothrombin Time: 13.8 seconds (ref 11.4–15.2)

## 2018-09-15 LAB — PREPARE RBC (CROSSMATCH)

## 2018-09-15 LAB — GROUP A STREP BY PCR: Group A Strep by PCR: NOT DETECTED

## 2018-09-15 LAB — ABO/RH: ABO/RH(D): B POS

## 2018-09-15 LAB — TSH: TSH: 5.055 u[IU]/mL — ABNORMAL HIGH (ref 0.350–4.500)

## 2018-09-15 IMAGING — CT CT OF THE RIGHT KNEE WITHOUT CONTRAST
3 series · 13 of 35 positions shown, 16 images · non-contrast
Comparison: None.

CLINICAL DATA: Bilateral knee pain. Sepsis. Knee replacement 2
weeks ago.

EXAM:
CT OF THE right KNEE WITHOUT CONTRAST
TECHNIQUE: Multidetector CT imaging of the right knee was performed according
to the standard protocol. Multiplanar CT image reconstructions were
also generated.

[Series 14: axial st rt knee · axial · 0.40mm/px · z∈[-546,-338]mm · 5 of 150 slices shown, 7 images]
[im 23/150  soft-tissue]
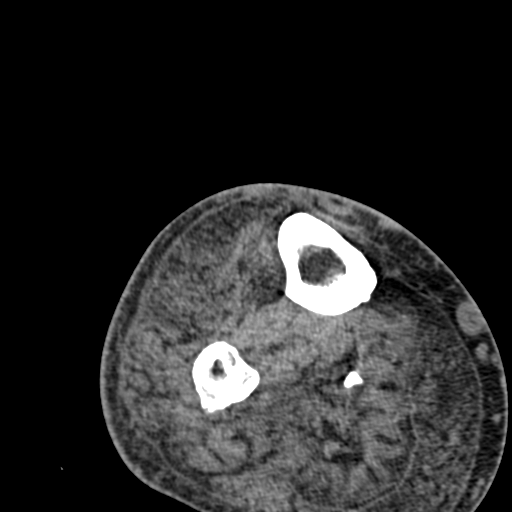
[im 23/150  bone]
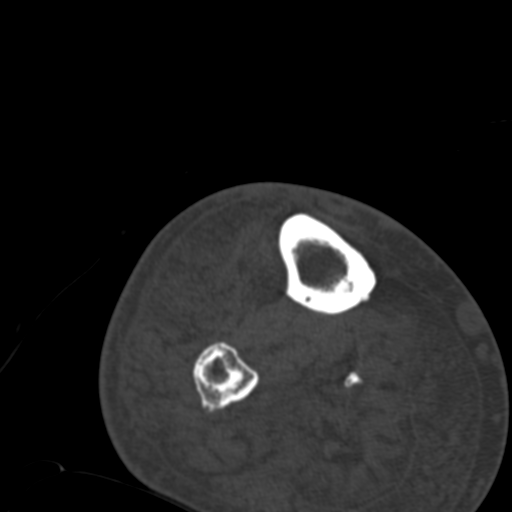
[im 46/150  bone]
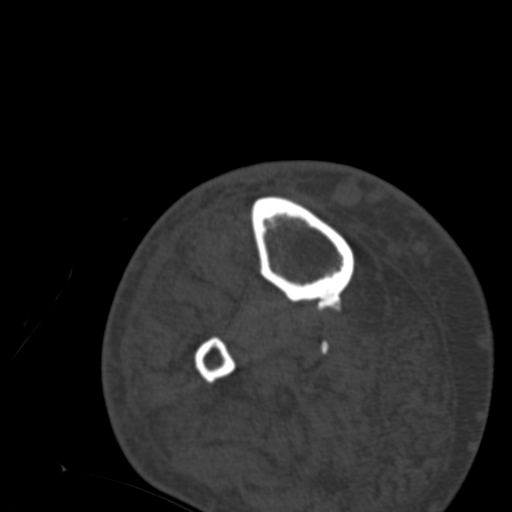
[im 81/150  bone]
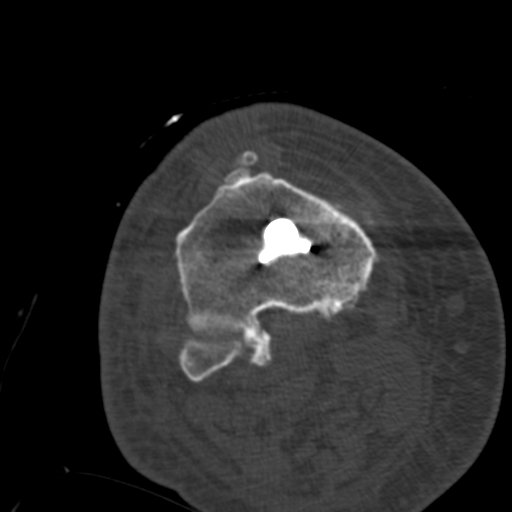
[im 104/150  bone]
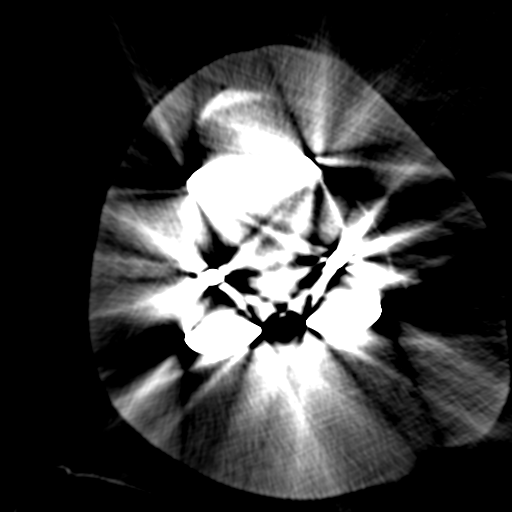
[im 127/150  soft-tissue]
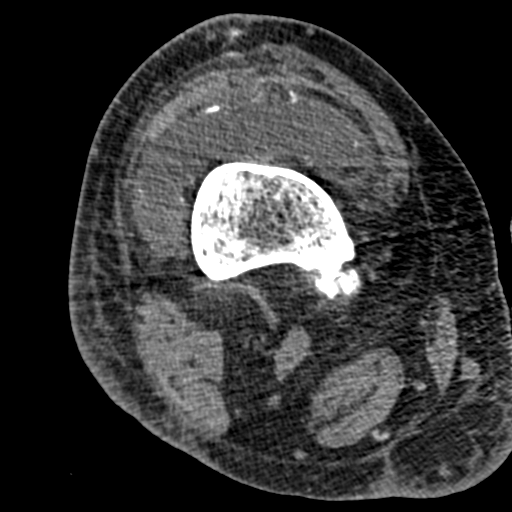
[im 127/150  bone]
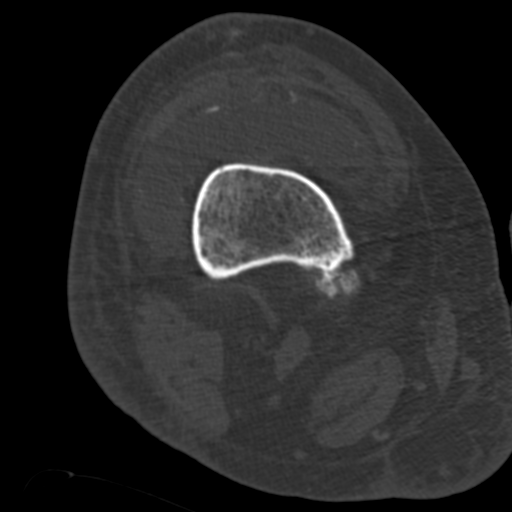

[Series 15: cor cor st rt knee · coronal · 0.46mm/px · 3 of 104 slices shown]
[im 21/104  bone]
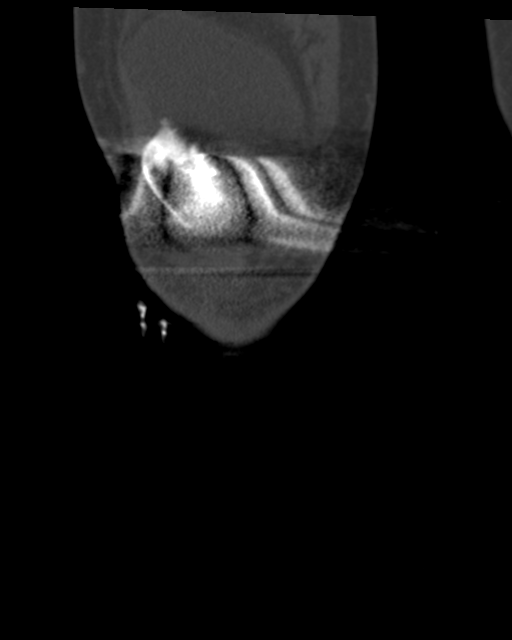
[im 42/104  bone]
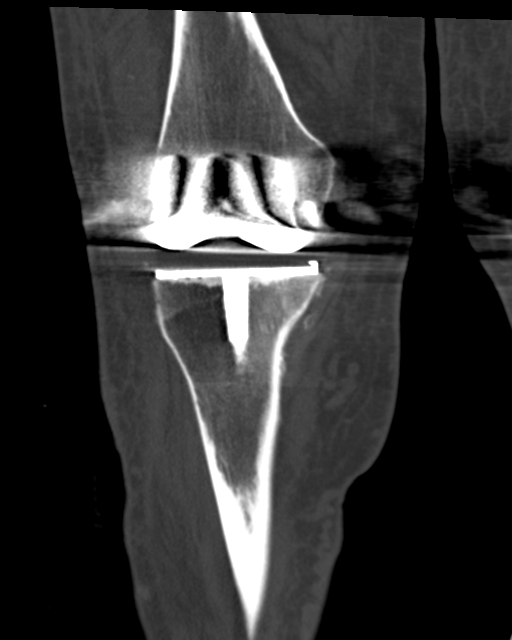
[im 62/104  bone]
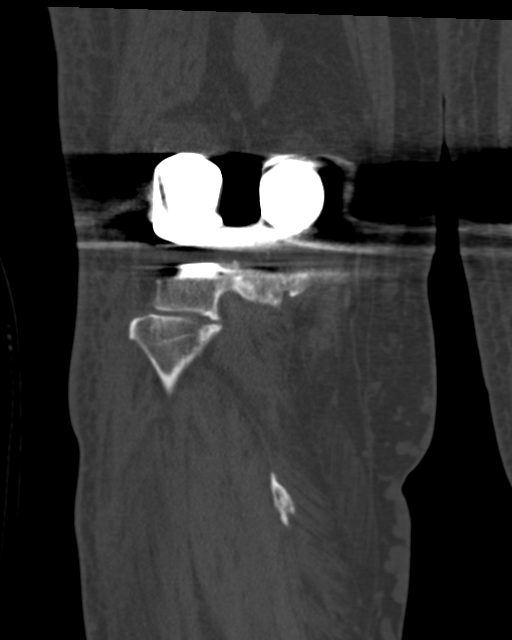

[Series 16: sag sag bone rt knee · sagittal · 0.45mm/px · 5 of 82 slices shown, 6 images]
[im 28/82  bone]
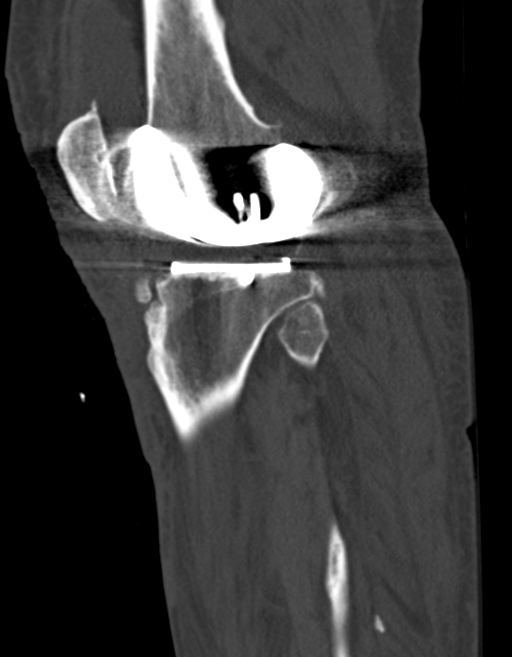
[im 34/82  bone]
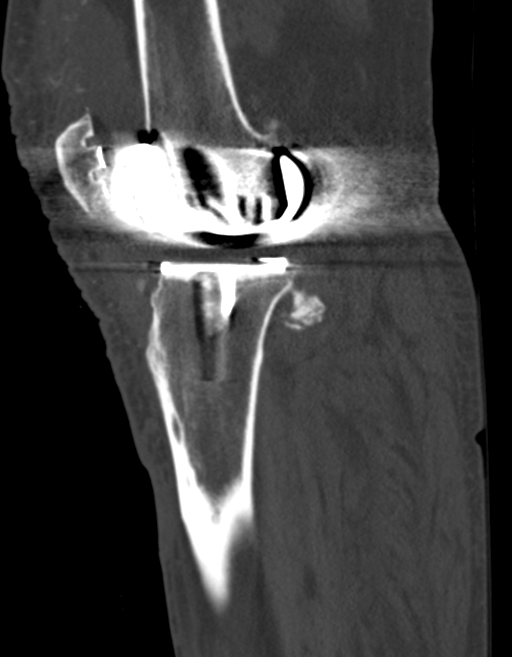
[im 41/82  soft-tissue]
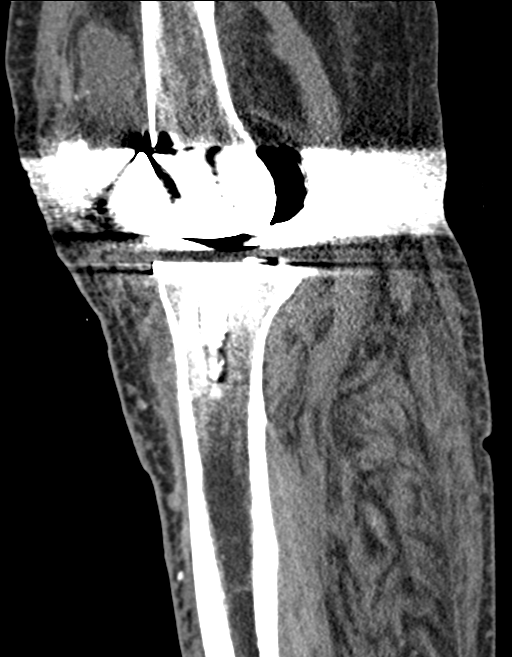
[im 41/82  bone]
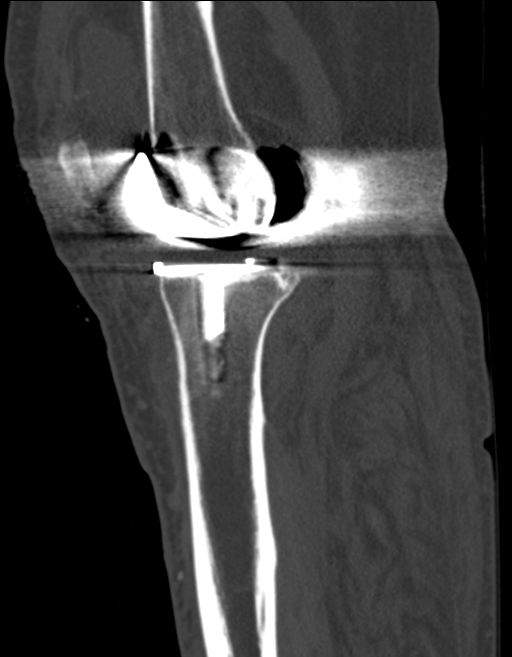
[im 48/82  bone]
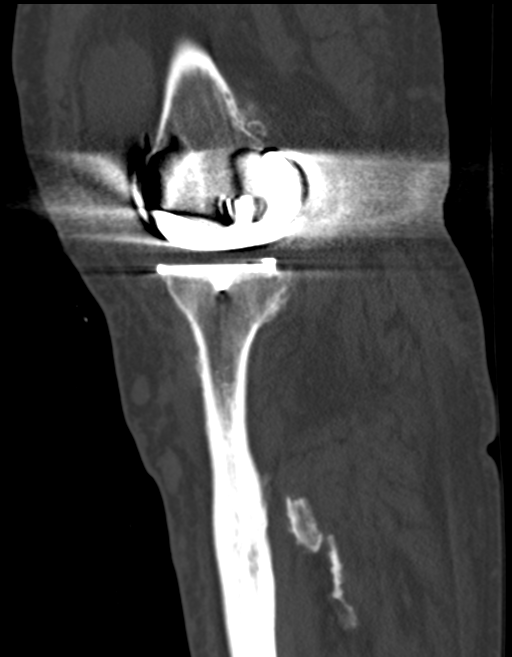
[im 55/82  bone]
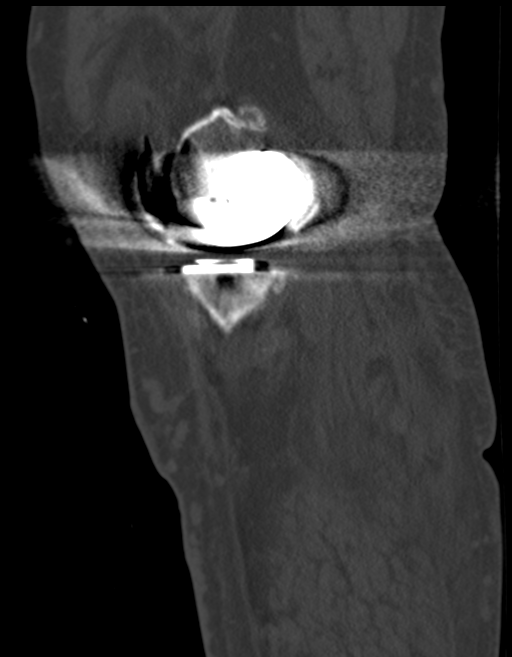

[13 of 35 positions shown; findings below may reference images not displayed]

FINDINGS: Bones/Joint/Cartilage

Right total knee prosthesis in place without abnormal lucency along
the components the prosthesis to suggest loosening or bony findings
of infection. No bony destructive findings are observed. No
well-defined periprosthetic fracture. Expected alignment of
components of the implant.

Moderate-sized knee joint effusion with scattered calcifications
along the margins of the suprapatellar bursa. No appreciable gas in
the joint.

Small well corticated ossicle along the tibial tubercle, likely from
remote Osgood-Schlatter disease.

Moderate-sized Baker's cyst.

There is evidence of an old fibular shaft fracture which has healed.
Small amount of chronic appearing heterotopic ossification along the
posterior margin of the soleus muscle.

Ligaments

Suboptimally assessed by CT.

Muscles and Tendons

Small amount of heterotopic calcification along the posterior margin
of the soleus muscle.

There is some irregularity along the medial distal quadriceps
possibly from partial release, correlate with operative history. The
defect in the quadriceps is smaller than on the contralateral side.

Soft tissues

Moderate subcutaneous edema in the distal thigh and proximal calf.
Medial subcutaneous varicosities in the proximal calf.
IMPRESSION: 1. Moderate-sized knee joint effusion extending into the
suprapatellar bursa. Moderate size Baker's cyst. No appreciable gas
in the joint or bony destructive findings. No abnormal lucency along
the implant.
2. Irregularity in the medial distal quadriceps tendon potentially
from partial release or a port into the joint, correlate with
operative history.
3. Well corticated ossicle in the distal patellar tendon likely from
remote Osgood-Schlatter disease.
4. Moderate subcutaneous edema in the region.

## 2018-09-15 IMAGING — CT CT OF THE LEFT KNEE WITHOUT CONTRAST
3 series · 14 of 33 positions shown, 17 images · non-contrast
Comparison: Radiographs [DATE]

CLINICAL DATA: Knee replacement, bilateral knee pain.  Sepsis.

EXAM:
CT OF THE left KNEE WITHOUT CONTRAST
TECHNIQUE: Multidetector CT imaging of the left knee was performed according to
the standard protocol. Multiplanar CT image reconstructions were
also generated.

[Series 3: axial st · axial · 0.48mm/px · z∈[-546,-316]mm · 6 of 150 slices shown, 8 images]
[im 23/150  soft-tissue]
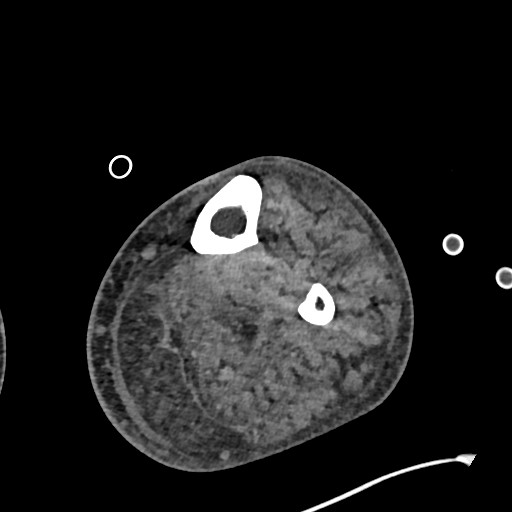
[im 23/150  bone]
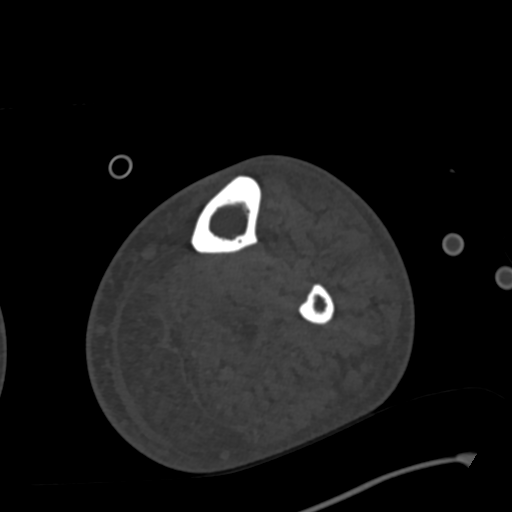
[im 46/150  bone]
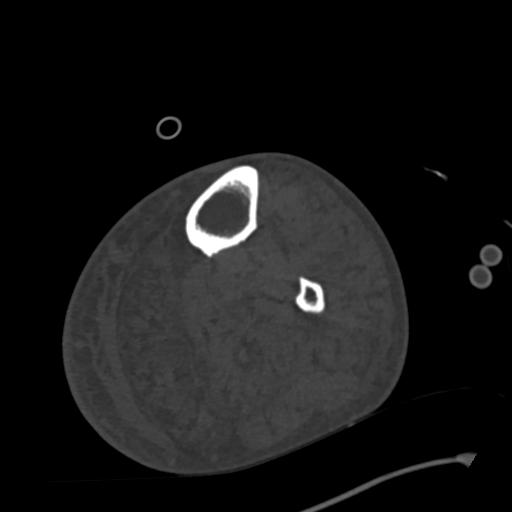
[im 69/150  bone]
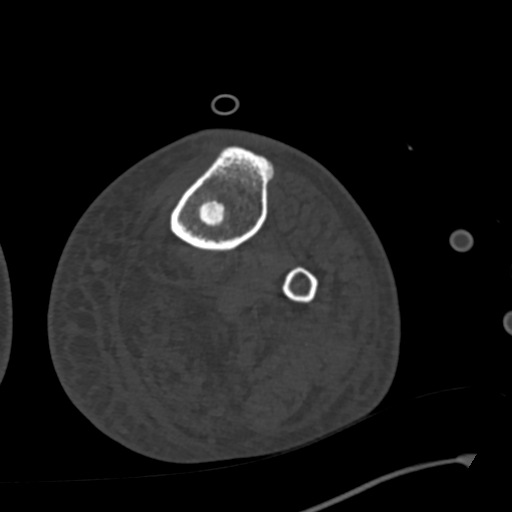
[im 92/150  bone]
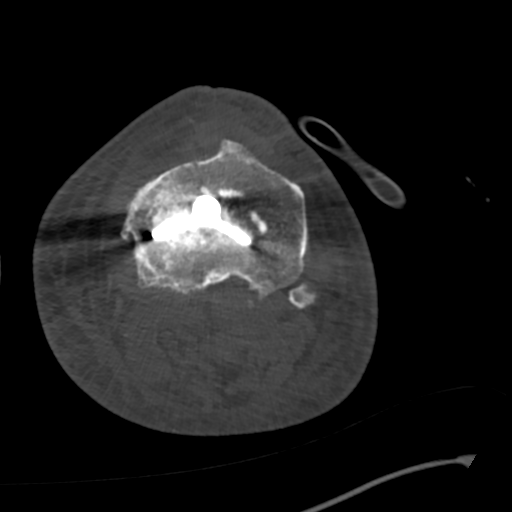
[im 115/150  soft-tissue]
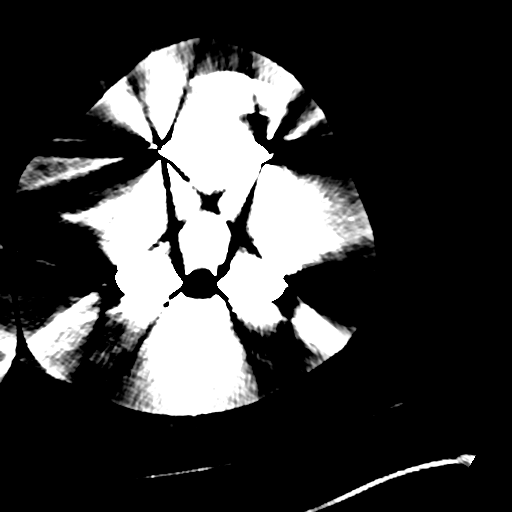
[im 115/150  bone]
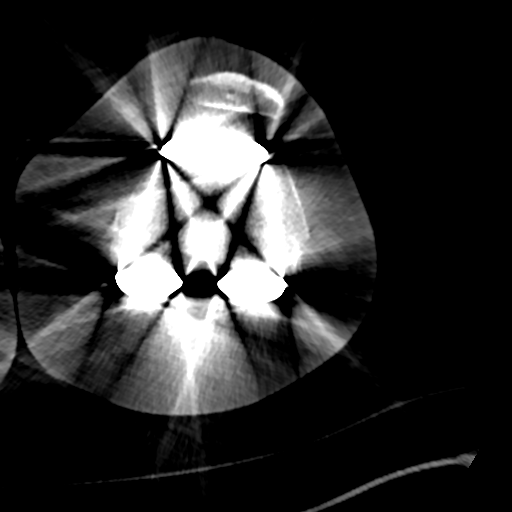
[im 138/150  bone]
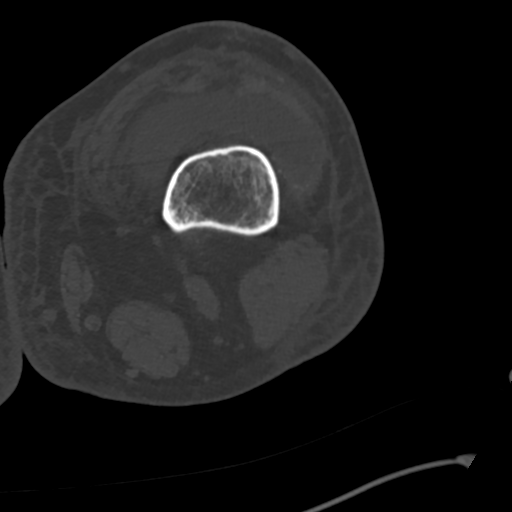

[Series 6: coronal st · coronal · 0.41mm/px · 3 of 97 slices shown]
[im 20/97  bone]
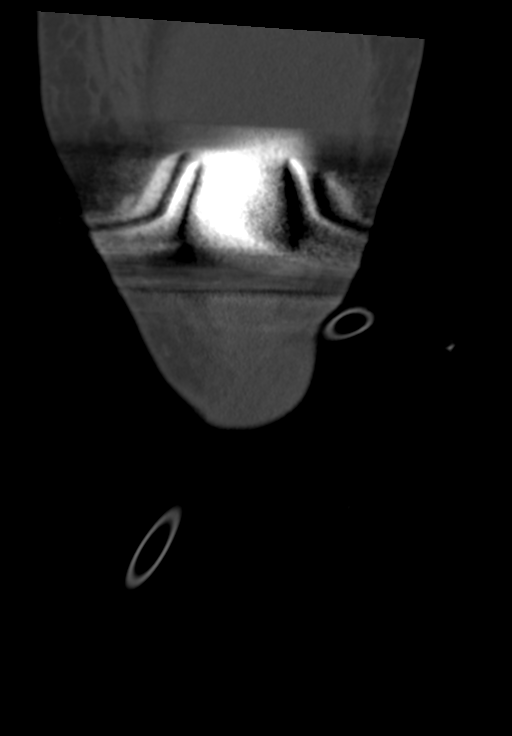
[im 39/97  bone]
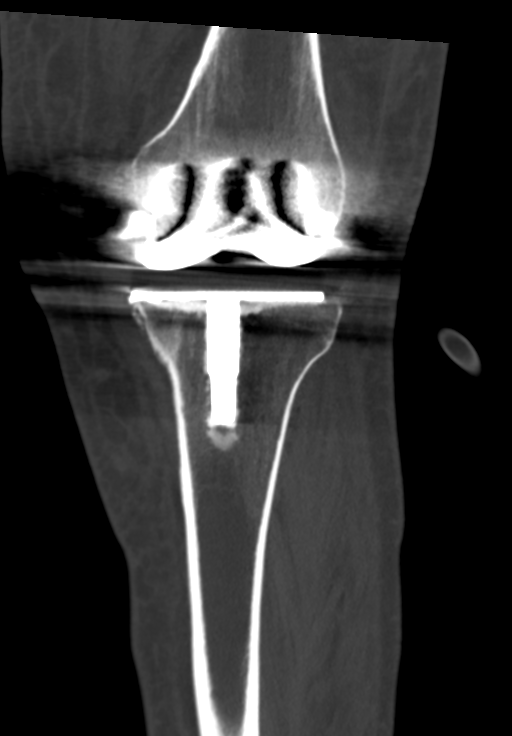
[im 58/97  bone]
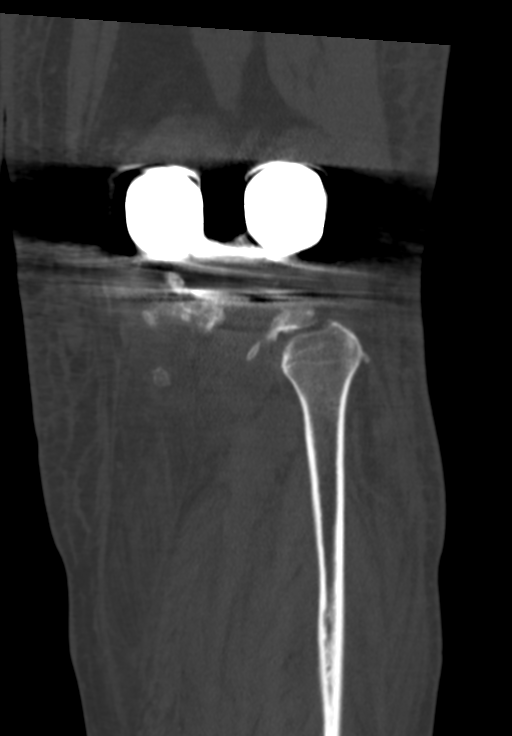

[Series 7: sagittal st · sagittal · 0.54mm/px · 5 of 92 slices shown, 6 images]
[im 31/92  bone]
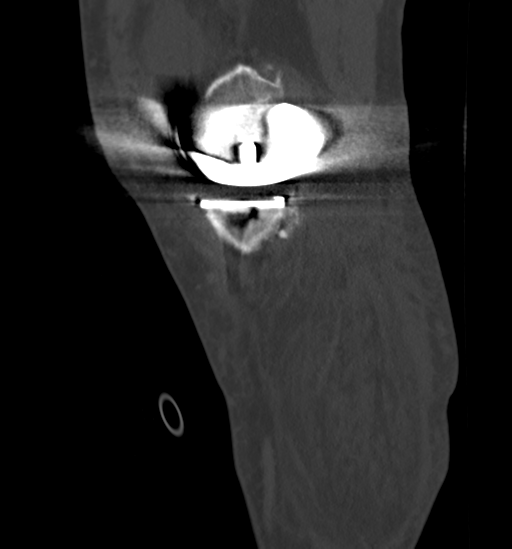
[im 38/92  bone]
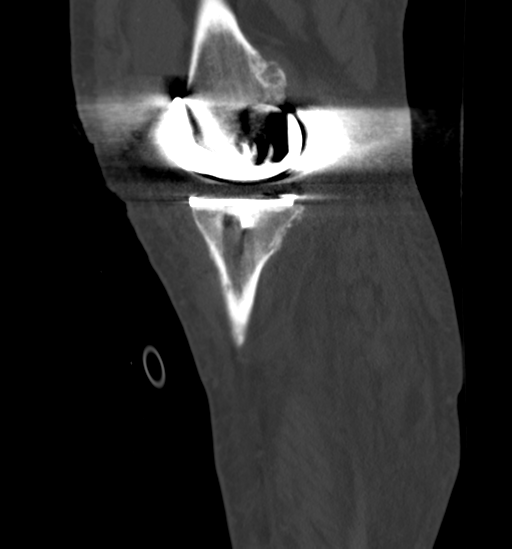
[im 46/92  soft-tissue]
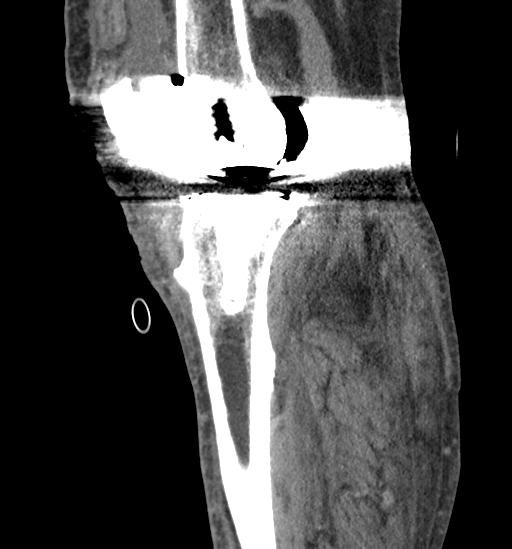
[im 46/92  bone]
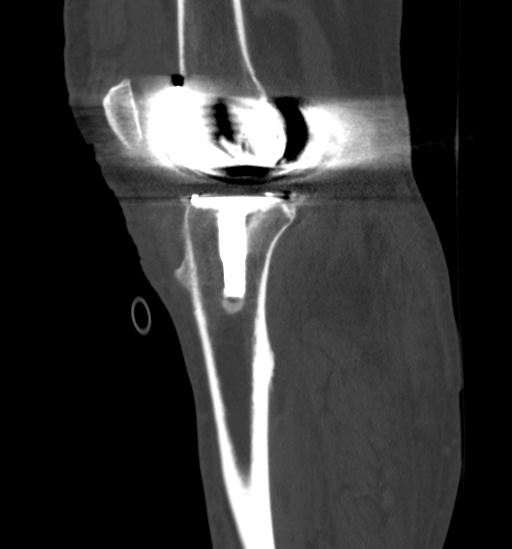
[im 54/92  bone]
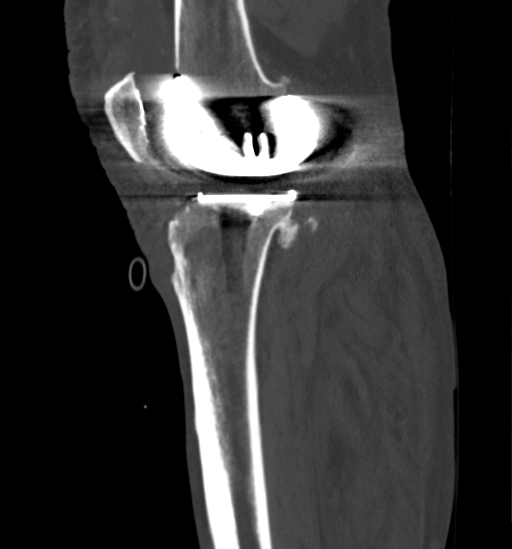
[im 61/92  bone]
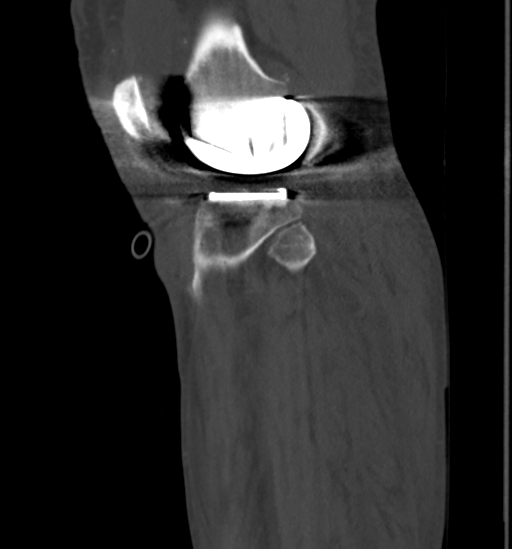

[14 of 33 positions shown; findings below may reference images not displayed]

FINDINGS: Bones/Joint/Cartilage

Total knee prosthesis in place. No significant abnormal lucency
along the metal-methacrylate it or methacrylate-bone interfaces at
this time. There may be a tiny amount of gas just above the anterior
portion of the tibial tray on image 53/2 which would be unusual for
2 weeks out from surgery, but the patient has had recent
arthrocentesis or drain in place than back because for gas in the
joint. Moderate to large joint effusion with some speckled
calcifications along the margins of the suprapatellar bursa for
example as shown on image [DATE]. Free osteochondral fragments are
present posteromedially in the joint along the margin of the polymer
spacer. No complicating feature of the patellar component is
identified.

There is a moderate size Baker's cyst posteromedially. In addition
there is a suggestion of a somewhat distended popliteus recess
potentially companion containing at least 1 free osteochondral
fragment.

Ligaments

Suboptimally assessed by CT.

Muscles and Tendons

There is appearance of at least partial disruption of the distal
quadriceps tendon.

Soft tissues

Diffuse subcutaneous edema along the visualized portion of the
distal thigh and proximal calf.
IMPRESSION: 1. Moderate to large joint effusion, potentially with a tiny amount
of loculated gas just above the anterior portion of the tibial tray
best appreciated on the axial images. This would be unusual 2 weeks
out from surgery, and could possibly indicate infection, but if the
patient has had recent arthrocentesis or drain placement in the
joint then sterile gas may have been introduced at that time.
Arthrocentesis could be utilized for definitive assessment of
infection of the joint fluid.
2. There is no abnormal lucency along the margins of the implant.
3. Several free osteochondral fragments posteromedial to the joint
line and also in the popliteus recess.
4. Partial disruption of the distal quadriceps tendon. Depending on
the type of surgery employed (traditional versus minimally invasive)
, this can be a normal postoperative finding.
5. Subcutaneous edema along the distal thigh and proximal calf.

## 2018-09-15 MED ORDER — DOCUSATE SODIUM 100 MG PO CAPS: 100.0000 mg | ORAL_CAPSULE | Freq: Two times a day (BID) | ORAL | Status: DC | PRN

## 2018-09-15 MED ORDER — INSULIN GLARGINE 100 UNIT/ML ~~LOC~~ SOLN
12.0000 [IU] | Freq: Every day | SUBCUTANEOUS | Status: DC
  Administered 2018-09-15 – 2018-09-21 (×6): 12 [IU] via SUBCUTANEOUS
  Filled 2018-09-15 (×8): qty 0.12

## 2018-09-15 MED ORDER — SODIUM CHLORIDE 0.9% IV SOLUTION
Freq: Once | INTRAVENOUS | Status: AC
  Administered 2018-09-15: 19:00:00 via INTRAVENOUS

## 2018-09-15 MED ORDER — SODIUM CHLORIDE 0.9% IV SOLUTION: Freq: Once | INTRAVENOUS | Status: DC

## 2018-09-15 MED ORDER — INSULIN ASPART 100 UNIT/ML ~~LOC~~ SOLN
0.0000 [IU] | Freq: Three times a day (TID) | SUBCUTANEOUS | Status: DC
  Administered 2018-09-15 (×2): 5 [IU] via SUBCUTANEOUS
  Administered 2018-09-16: 3 [IU] via SUBCUTANEOUS
  Administered 2018-09-16: 5 [IU] via SUBCUTANEOUS
  Administered 2018-09-16 – 2018-09-18 (×6): 3 [IU] via SUBCUTANEOUS
  Administered 2018-09-19: 2 [IU] via SUBCUTANEOUS
  Administered 2018-09-19: 5 [IU] via SUBCUTANEOUS
  Administered 2018-09-19: 19:00:00 3 [IU] via SUBCUTANEOUS
  Administered 2018-09-20 (×2): 2 [IU] via SUBCUTANEOUS
  Administered 2018-09-20: 3 [IU] via SUBCUTANEOUS
  Administered 2018-09-21: 5 [IU] via SUBCUTANEOUS
  Administered 2018-09-21: 3 [IU] via SUBCUTANEOUS
  Administered 2018-09-21: 5 [IU] via SUBCUTANEOUS
  Filled 2018-09-15 (×18): qty 1

## 2018-09-15 MED ORDER — ALBUTEROL SULFATE (2.5 MG/3ML) 0.083% IN NEBU: 2.5000 mg | INHALATION_SOLUTION | Freq: Four times a day (QID) | RESPIRATORY_TRACT | Status: DC | PRN

## 2018-09-15 NOTE — Progress Notes (Signed)
[facility]  ROUNDING NOTE   Subjective:   UOP 2050 Altered mental status yesterday.  Tmax 102.1  Staples removed yesterday.   A-line placed yesterday.   On phenylephrine, vasopressin, and norepinephrine.   PRBC transfusion yesterday.   Objective:  Vital signs in last 24 hours:  Temp:  [98.6 F (37 C)-102.1 F (38.9 C)] 98.8 F (37.1 C) (05/29 0800) Pulse Rate:  [86-116] 105 (05/29 0900) Resp:  [10-38] 27 (05/29 0900) BP: (61-151)/(32-122) 127/63 (05/29 0900) SpO2:  [79 %-100 %] 95 % (05/29 0900) Arterial Line BP: (62-145)/(38-67) 145/66 (05/29 0900)  Weight change:  Filed Weights   09/12/18 0935  Weight: (!) 145.2 kg    Intake/Output: I/O last 3 completed shifts: In: 4881.1 [I.V.:4215.4; IV Piggyback:665.7] Out: 2955 [Urine:2955]   Intake/Output this shift:  No intake/output data recorded.  Physical Exam: General: NAD, ill appearing  Head: Normocephalic, atraumatic. Moist oral mucosal membranes  Eyes: Anicteric, PERRL  Neck: Supple, trachea midline  Lungs:  Clear to auscultation  Heart: Regular rate and rhythm  Abdomen:  Soft, nontender, obese  Extremities:  ++ peripheral edema. +erythema around bilateral knee incisions  Neurologic: Alert and oriented  Skin: No lesions        Basic Metabolic Panel: Recent Labs  Lab 09/12/18 0955 09/13/18 0344 09/14/18 0336 09/15/18 0436  NA 126* 126* 126* 127*  K 4.4 4.1 3.8 3.6  CL 90* 96* 92* 90*  CO2 17* 18* 24 22  GLUCOSE 129* 196* 247* 213*  BUN 28* 34* 29* 27*  CREATININE 3.71* 4.31* 3.50* 2.82*  CALCIUM 9.7 7.9* 7.8* 8.3*    Liver Function Tests: Recent Labs  Lab 09/12/18 0955 09/15/18 0436  AST 70* 148*  ALT 42 57*  ALKPHOS 86 70  BILITOT 0.7 0.7  PROT 7.6 5.8*  ALBUMIN 3.0* 1.9*   Recent Labs  Lab 09/12/18 0955  LIPASE 74*   No results for input(s): AMMONIA in the last 168 hours.  CBC: Recent Labs  Lab 09/12/18 0955 09/13/18 0344 09/14/18 0336 09/15/18 0436   WBC 16.1* 9.4 5.9 5.5  NEUTROABS 10.3*  --  3.5 3.0  HGB 11.5* 8.7* 8.0* 7.5*  HCT 35.7* 26.7* 24.5* 23.2*  MCV 82.4 81.9 81.9 82.0  PLT 137* 137* 144* 158    Cardiac Enzymes: Recent Labs  Lab 09/13/18 0344  CKTOTAL 4,516*    BNP: Invalid input(s): POCBNP  CBG: Recent Labs  Lab 09/14/18 0757 09/14/18 1316 09/14/18 1650 09/14/18 1953 09/15/18 0731  GLUCAP 247* 187* 199* 252* 209*    Microbiology: Results for orders placed or performed during the hospital encounter of 09/12/18  Blood Culture (routine x 2)     Status: None (Preliminary result)   Collection Time: 09/12/18  9:56 AM  Result Value Ref Range Status   Specimen Description BLOOD LEFT HAND  Final   Special Requests   Final    BOTTLES DRAWN AEROBIC AND ANAEROBIC Blood Culture adequate volume   Culture   Final    NO GROWTH 3 DAYS Performed at [facility], [address], [place], [place] [address]    Report Status PENDING  Incomplete  Urine culture     Status: None   Collection Time: 09/12/18 10:01 AM  Result Value Ref Range Status   Specimen Description   Final    URINE, RANDOM Performed at [facility], [address], [place], [place] [address]    Special Requests   Final    NONE Performed at [facility], [address] [place], [place] [address]    Culture   Final    NO GROWTH Performed at [facility], [place] [address], [place], [place] [address]    Report Status 09/13/2018 FINAL  Final  SARS Coronavirus 2 (CEPHEID- Performed in [place] hospital lab), Hosp Order     Status: None   Collection Time: 09/12/18 10:02 AM  Result Value Ref Range Status   SARS Coronavirus 2 NEGATIVE NEGATIVE Final    Comment: (NOTE) If result is NEGATIVE SARS-CoV-2 target nucleic acids are NOT DETECTED. The SARS-CoV-2 RNA is generally detectable in upper and lower  respiratory specimens during the acute phase of infection. The lowest  concentration of SARS-CoV-2 viral  copies this assay can detect is 250  copies / mL. A negative result does not preclude SARS-CoV-2 infection  and should not be used as the sole basis for treatment or other  patient management decisions.  A negative result may occur with  improper specimen collection / handling, submission of specimen other  than nasopharyngeal swab, presence of viral mutation(s) within the  areas targeted by this assay, and inadequate number of viral copies  (<250 copies / mL). A negative result must be combined with clinical  observations, patient history, and epidemiological information. If result is POSITIVE SARS-CoV-2 target nucleic acids are DETECTED. The SARS-CoV-2 RNA is generally detectable in upper and lower  respiratory specimens dur ing the acute phase of infection.  Positive  results are indicative of active infection with SARS-CoV-2.  Clinical  correlation with patient history and other diagnostic information is  necessary to determine patient infection status.  Positive results do  not rule out bacterial infection or co-infection with other viruses. If result is PRESUMPTIVE POSTIVE SARS-CoV-2 nucleic acids MAY BE PRESENT.   A presumptive positive result was obtained on the submitted specimen  and confirmed on repeat testing.  While 2019 novel coronavirus  (SARS-CoV-2) nucleic acids may be present in the submitted sample  additional confirmatory testing may be necessary for epidemiological  and / or clinical management purposes  to differentiate between  SARS-CoV-2 and other Sarbecovirus currently known to infect humans.  If clinically indicated additional testing with an alternate test  methodology [phone]) is advised. The SARS-CoV-2 RNA is generally  detectable in upper and lower respiratory sp ecimens during the acute  phase of infection. The expected result is Negative. Fact Sheet for Patients:  StrictlyIdeas.no Fact Sheet for Healthcare  Providers: BankingDealers.co.za This test is not yet approved or cleared by the [place] FDA and has been authorized for detection and/or diagnosis of SARS-CoV-2 by FDA under an Emergency Use Authorization (EUA).  This EUA will remain in effect (meaning this test can be used) for the duration of the COVID-19 declaration under Section 564(b)(1) of the Act, 21 U.S.C. section 360bbb-3(b)(1), unless the authorization is terminated or revoked sooner. Performed at [facility], [address] [place], [place] [address]   Blood Culture (routine x 2)     Status: None (Preliminary result)   Collection Time: 09/12/18 10:06 AM  Result Value Ref Range Status   Specimen Description BLOOD LEFT ANTECUBITAL  Final   Special Requests   Final    BOTTLES DRAWN AEROBIC AND ANAEROBIC Blood Culture adequate volume   Culture   Final    NO GROWTH 3 DAYS Performed at [facility], [address], [place], [place] [address]    Report Status PENDING  Incomplete  MRSA PCR Screening  Status: None   Collection Time: 09/12/18  7:04 PM  Result Value Ref Range Status   MRSA by PCR NEGATIVE NEGATIVE Final    Comment:        The GeneXpert MRSA Assay (FDA approved for NASAL specimens only), is one component of a comprehensive MRSA colonization surveillance program. It is not intended to diagnose MRSA infection nor to guide or monitor treatment for MRSA infections. Performed at [facility], [address] [place], [place] [address]   C difficile quick scan w PCR reflex     Status: None   Collection Time: 09/14/18  2:21 PM  Result Value Ref Range Status   C Diff antigen NEGATIVE NEGATIVE Final   C Diff toxin NEGATIVE NEGATIVE Final   C Diff interpretation No C. difficile detected.  Final    Comment: Performed at [facility], [address] [place], [place] [address]    Coagulation Studies: Recent Labs    09/14/18 1121  LABPROT  15.7*  INR 1.3*    Urinalysis: No results for input(s): COLORURINE, LABSPEC, PHURINE, GLUCOSEU, HGBUR, BILIRUBINUR, KETONESUR, PROTEINUR, UROBILINOGEN, NITRITE, LEUKOCYTESUR in the last 72 hours.  Invalid input(s): APPERANCEUR    Imaging: Ct Head Wo Contrast  Result Date: 09/14/2018 CLINICAL DATA:  Altered mental status. Hypertension. Symptoms began today. EXAM: CT HEAD WITHOUT CONTRAST TECHNIQUE: Contiguous axial images were obtained from the base of the skull through the vertex without intravenous contrast. COMPARISON:  None. FINDINGS: Brain: The brain shows a normal appearance without evidence of malformation, atrophy, old or acute small or large vessel infarction, mass lesion, hemorrhage, hydrocephalus or extra-axial collection. Vascular: No hyperdense vessel. No evidence of atherosclerotic calcification. Skull: Normal.  No traumatic finding.  No focal bone lesion. Sinuses/Orbits: Sinuses are clear. Orbits appear normal. Mastoids are clear. Other: None significant IMPRESSION: Normal head CT Electronically Signed   By: Nelson Chimes M.D.   On: 09/14/2018 15:21   US Renal  Result Date: 09/13/2018 CLINICAL DATA:  Acute renal failure EXAM: RENAL / URINARY TRACT ULTRASOUND COMPLETE COMPARISON:  CT dated 04/30/2015 FINDINGS: Right Kidney: Renal measurements: 15.8 x 6.6 x 6.4 cm = volume: 349 mL. There is a simple cyst measuring 4.1 cm in the interpolar region. There is a simple appearing cyst in the lower pole measuring 4.4 cm. Left Kidney: Renal measurements: 3.5 x 6 x 5.1 cm = volume: 214 mL. Echogenicity within normal limits. No mass or hydronephrosis visualized. Bladder: Poorly evaluated secondary to the presence of a Foley catheter. IMPRESSION: 1. No acute sonographic abnormality detected.  No hydronephrosis. 2. Decompressed urinary bladder which limits evaluation. Electronically Signed   By: Constance Holster M.D.   On: 09/13/2018 19:19   US Venous Img Lower Bilateral  Result Date:  09/13/2018 CLINICAL DATA:  Leg pain and swelling. EXAM: BILATERAL LOWER EXTREMITY VENOUS DOPPLER ULTRASOUND TECHNIQUE: Gray-scale sonography with graded compression, as well as color Doppler and duplex ultrasound were performed to evaluate the lower extremity deep venous systems from the level of the common femoral vein and including the common femoral, femoral, profunda femoral, popliteal and calf veins including the posterior tibial, peroneal and gastrocnemius veins when visible. The superficial great saphenous vein was also interrogated. Spectral Doppler was utilized to evaluate flow at rest and with distal augmentation maneuvers in the common femoral, femoral and popliteal veins. COMPARISON:  None. FINDINGS: RIGHT LOWER EXTREMITY Common Femoral Vein: No evidence of thrombus. Normal compressibility, respiratory phasicity and response to augmentation. Saphenofemoral Junction: No evidence of thrombus. Normal  compressibility and flow on color Doppler imaging. Profunda Femoral Vein: No evidence of thrombus. Normal compressibility and flow on color Doppler imaging. Femoral Vein: No evidence of thrombus. Normal compressibility, respiratory phasicity and response to augmentation. Popliteal Vein: No evidence of thrombus. Normal compressibility, respiratory phasicity and response to augmentation. Calf Veins: No evidence of thrombus. Normal compressibility and flow on color Doppler imaging. The peroneal vein was not well visualized. Superficial Great Saphenous Vein: No evidence of thrombus. Normal compressibility. Venous Reflux:  None. Other Findings:  None. LEFT LOWER EXTREMITY Common Femoral Vein: No evidence of thrombus. Normal compressibility, respiratory phasicity and response to augmentation. Saphenofemoral Junction: No evidence of thrombus. Normal compressibility and flow on color Doppler imaging. Profunda Femoral Vein: No evidence of thrombus. Normal compressibility and flow on color Doppler imaging. Femoral Vein:  No evidence of thrombus. Normal compressibility, respiratory phasicity and response to augmentation. Popliteal Vein: No evidence of thrombus. Normal compressibility, respiratory phasicity and response to augmentation. Calf Veins: No evidence of thrombus. Normal compressibility and flow on color Doppler imaging. The peroneal vein was not well visualized. Superficial Great Saphenous Vein: No evidence of thrombus. Normal compressibility. Venous Reflux:  None. Other Findings:  None. IMPRESSION: No evidence of deep venous thrombosis in either lower extremity. The bilateral peroneal veins were not well visualized. Electronically Signed   By: Constance Holster M.D.   On: 09/13/2018 19:17   Dg Chest Port 1 View  Result Date: 09/14/2018 CLINICAL DATA:  Central line placement EXAM: PORTABLE CHEST 1 VIEW COMPARISON:  09/14/2018 FINDINGS: There is a newly placed right-sided central venous catheter with tip terminating over the SVC. There is no pneumothorax. The lung volumes are low. The heart size is enlarged. There are hazy opacities bilaterally with prominent interstitial lung markings. There is a small right-sided pleural effusion. There is likely a trace left-sided pleural effusion. IMPRESSION: 1. Right-sided central venous catheter as above with no pneumothorax. 2. Low lung volumes. 3. Borderline enlarged heart. 4. Prominent interstitial lung markings and a few hazy ground-glass airspace opacities bilaterally are nonspecific but can be seen in patients with pulmonary edema or an atypical infectious process. 5. Developing small right-sided pleural effusion. Electronically Signed   By: Constance Holster M.D.   On: 09/14/2018 13:47   Dg Chest Port 1 View  Result Date: 09/14/2018 CLINICAL DATA:  Dyspnea EXAM: PORTABLE CHEST 1 VIEW COMPARISON:  09/12/2018 chest radiograph. FINDINGS: Low lung volumes. Stable cardiomediastinal silhouette with normal heart size. No pneumothorax. No pleural effusion. No overt pulmonary  edema. No acute consolidative airspace disease. IMPRESSION: Low lung volumes, without active cardiopulmonary disease. Electronically Signed   By: Ilona Sorrel M.D.   On: 09/14/2018 10:56     Medications:   . ceFEPime (MAXIPIME) IV 2 g (09/15/18 0949)  . norepinephrine (LEVOPHED) Adult infusion 5 mcg/min (09/15/18 0557)  . phenylephrine (NEO-SYNEPHRINE) Adult infusion Stopped (09/14/18 1305)  . vancomycin Stopped (09/14/18 1520)  . vasopressin (PITRESSIN) infusion - *FOR SHOCK* 0.03 Units/min (09/14/18 1700)   . sodium chloride   Intravenous Once  . aspirin EC  81 mg Oral Daily  . Chlorhexidine Gluconate Cloth  6 each Topical Q0600  . citalopram  20 mg Oral Daily  . fluticasone furoate-vilanterol  1 puff Inhalation Daily  . insulin aspart  0-5 Units Subcutaneous QHS  . insulin aspart  0-9 Units Subcutaneous TID WC  . insulin glargine  8 Units Subcutaneous QHS  . loratadine  10 mg Oral Daily  . rosuvastatin  20 mg Oral Daily  acetaminophen **OR** acetaminophen, albuterol, docusate sodium, HYDROcodone-acetaminophen, HYDROcodone-acetaminophen, ondansetron **OR** ondansetron (ZOFRAN) IV  Assessment/ Plan:  [name] is a 58 y.o. black male with hyeprtension, COPD, diabetes mellitus type II who underwent bilateral knee replacement surgeries last week. who was admitted to [facility] on 09/12/2018 for sepsis  1. Acute renal failure with metabolic acidosis: baseline creatinine of 1.07 with normal GFR in 06/12/18.  TMP/SMX could have been contributing to renal failure Nonoliguric urine output - Holding metformin, lisinopril and TMP/SMX  2. Septic shock/hypotension - unclear source. Requiring vasopressors.  Appreciate ortho input.  Appreciate ID input - discontinued tamsulosin and atenolol  3. Hyponatremia: secondary to renal failure and hypervolemic volume status. Continue to monitor.    LOS: 3 Isaac Cross 5/29/202010:25 AM

## 2018-09-15 NOTE — Evaluation (Signed)
Physical Therapy Evaluation Patient Details Name: Isaac Cross MRN: [id number] DOB: 1961/02/25 Today's Date: 09/15/2018   History of Present Illness  Pt is a 58 y.o. male presenting to hospital 09/12/18 after rolling OOB landing on L knee; pt with increased dizziness x2 days, fevers x3 days, and generalized weakness.  S/p B TKR 09/01/18 in [place] (pt reports discharging home same day).  Pt admitted with sepsis, acute renal failure, and B TKR.  PMH includes B TKR 09/01/18 in [place], DM, asthma, R vein surgery, COPD.  Clinical Impression  Prior to hospital admission, pt reports he was doing well ambulating with RW s/p B TKR 09/01/18.  Pt lives with his wife on main level of home with steps to enter.  Mobility and R knee flexion ROM deferred d/t R femoral arterial line.  R knee extension 5 degrees short of neutral; L knee extension 12 degrees short of neutral; and L knee flexion 80 degrees AAROM semi-supine in bed.  Pt tolerated L LE ex's fairly well in bed (except L knee flexion ROM appearing painful so limited per pt tolerance).  Pt would benefit from skilled PT to address noted impairments and functional limitations (see below for any additional details).  Upon hospital discharge, anticipate pt will be able to discharge home with 24/7 assist but will need to assess functional mobility and update goals/POC once R femoral arterial line is removed and pt is appropriate for OOB mobility.  Currently pt's therapy frequency minimum of 2x's per week but once R femoral arterial line is removed (and pt medically appropriate) will increase frequency as appropriate.    Follow Up Recommendations Supervision/Assistance - 24 hour;Home health PT    Equipment Recommendations  Rolling walker with 5" wheels;3in1 (PT)    Recommendations for Other Services       Precautions / Restrictions Precautions Precautions: Fall Precaution Comments: R femoral arterial line; R IJ CVC Restrictions Weight Bearing Restrictions:  Yes RLE Weight Bearing: Weight bearing as tolerated LLE Weight Bearing: Weight bearing as tolerated Other Position/Activity Restrictions: WBAT per MD Harlow Mares note 09/12/18      Mobility  Bed Mobility               General bed mobility comments: Mobility deferred d/t R femoral arterial line  Transfers                 General transfer comment: Mobility deferred d/t R femoral arterial line  Ambulation/Gait             General Gait Details: Mobility deferred d/t R femoral arterial line  Stairs            Wheelchair Mobility    Modified Rankin (Stroke Patients Only)       Balance                                             Pertinent Vitals/Pain Pain Assessment: 0-10 Pain Score: 3  Pain Location: B knee and plantar surface L heel (pt reports L heel has hurt since B TKR surgery) Pain Descriptors / Indicators: Sore Pain Intervention(s): Limited activity within patient's tolerance;Monitored during session;Repositioned;Other (comment)(RN notified)  HR, O2 (on room air), and R femoral arterial BP WFL during session's activities.  BP reading lower via external UE BP cuff though (nurse reports arterial line more accurate representation of BP).    Home Living Family/patient expects  to be discharged to:: Private residence Living Arrangements: Spouse/significant other Available Help at Discharge: Family Type of Home: House Home Access: Stairs to enter Entrance Stairs-Rails: Psychiatric nurse of Steps: 2 [place]: Two level;Able to live on main level with bedroom/bathroom Home Equipment: Gilford Rile - 2 wheels;Toilet riser;Grab bars - tub/shower;Bedside commode      Prior Function Level of Independence: Independent with assistive device(s)         Comments: Ambulatory with RW.  Pt reports doing well s/p B TKR.     Hand Dominance        Extremity/Trunk Assessment   Upper Extremity Assessment Upper Extremity  Assessment: Generalized weakness    Lower Extremity Assessment Lower Extremity Assessment: RLE deficits/detail;LLE deficits/detail RLE Deficits / Details: DF/PF AROM WFL; good R quad set (rest of R LE deferred d/t R femoral arterial line) LLE Deficits / Details: Fair to good L quad set; DF/PF AROM WFL LLE: Unable to fully assess due to pain    Cervical / Trunk Assessment Cervical / Trunk Assessment: Normal  Communication   Communication: No difficulties  Cognition Arousal/Alertness: Awake/alert Behavior During Therapy: WFL for tasks assessed/performed Overall Cognitive Status: Within Functional Limits for tasks assessed                                        General Comments   Nursing cleared pt for participation in physical therapy.  Pt agreeable to PT session.    Exercises Total Joint Exercises Quad Sets: AROM;Strengthening;Both;10 reps;Supine Short Arc Quad: AROM;Strengthening;Left;10 reps;Supine Heel Slides: AAROM;Strengthening;Left;10 reps;Supine Hip ABduction/ADduction: AAROM;Strengthening;Left;10 reps;Supine Straight Leg Raises: AAROM;Strengthening;Left;10 reps;Supine Goniometric ROM: R knee extension 5 degrees short of neutral semi-supine in bed (deferred R knee flexion ROM d/t R femoral arterial line); L knee extension 12 degrees short of neutral and L knee flexion 80 degrees AAROM semi-supine in bed   Assessment/Plan    PT Assessment Patient needs continued PT services  PT Problem List Decreased strength;Decreased range of motion;Decreased activity tolerance;Decreased balance;Decreased mobility;Decreased knowledge of use of DME;Decreased knowledge of precautions;Pain;Decreased skin integrity       PT Treatment Interventions DME instruction;Gait training;Stair training;Functional mobility training;Therapeutic activities;Therapeutic exercise;Balance training;Patient/family education    PT Goals (Current goals can be found in the Care Plan section)   Acute Rehab PT Goals Patient Stated Goal: to go home PT Goal Formulation: With patient Time For Goal Achievement: 09/29/18 Potential to Achieve Goals: Fair    Frequency Min 2X/week   Barriers to discharge        Co-evaluation               AM-PAC PT "6 Clicks" Mobility  Outcome Measure Help needed turning from your back to your side while in a flat bed without using bedrails?: A Little Help needed moving from lying on your back to sitting on the side of a flat bed without using bedrails?: A Little Help needed moving to and from a bed to a chair (including a wheelchair)?: A Lot Help needed standing up from a chair using your arms (e.g., wheelchair or bedside chair)?: A Lot Help needed to walk in hospital room?: A Lot Help needed climbing 3-5 steps with a railing? : A Lot 6 Click Score: 14    End of Session   Activity Tolerance: Patient tolerated treatment well Patient left: in bed;with call bell/phone within reach;Other (comment)(B heels floating and LE's positioned to promote  B knee extension) Nurse Communication: Mobility status;Precautions;Weight bearing status;Other (comment)(Pt's pain status) PT Visit Diagnosis: Other abnormalities of gait and mobility (R26.89);Muscle weakness (generalized) (M62.81);History of falling (Z91.81);Difficulty in walking, not elsewhere classified (R26.2);Pain Pain - Right/Left: Left(Right) Pain - part of body: Knee    Time: 0912-0945 PT Time Calculation (min) (ACUTE ONLY): 33 min   Charges:   PT Evaluation $PT Eval Low Complexity: 1 Low PT Treatments $Therapeutic Exercise: 8-22 mins       Leitha Bleak, PT 09/15/18, 10:20 AM [phone]

## 2018-09-15 NOTE — Progress Notes (Signed)
Patient Wife updated and consent received via phone for blood transfusion. Lovena Le RN second verified over the phone.

## 2018-09-15 NOTE — Progress Notes (Signed)
Inpatient Diabetes Program Recommendations  AACE/ADA: New Consensus Statement on Inpatient Glycemic Control (2015)  Target Ranges:  Prepandial:   less than 140 mg/dL      Peak postprandial:   less than 180 mg/dL (1-2 hours)      Critically ill patients:  140 - 180 mg/dL   Results for ISAC, LINCKS (MRN [id number]) as of 09/15/2018 07:58  Ref. Range 09/14/2018 07:57 09/14/2018 13:16 09/14/2018 16:50 09/14/2018 19:53  Glucose-Capillary Latest Ref Range: 70 - 99 mg/dL 247 (H)  3 units NOVOLOG  187 (H)  2 units NOVOLOG  199 (H)  2 units NOVOLOG  252 (H)  3 units NOVOLOG +  8 units LANTUS given at 9:30pm    Results for HOLLEY, WIRT (MRN [id number]) as of 09/15/2018 07:58  Ref. Range 09/15/2018 07:31  Glucose-Capillary Latest Ref Range: 70 - 99 mg/dL 209 (H)   Results for ZYMIR, NAPOLI (MRN [id number]) as of 09/15/2018 07:58  Ref. Range 09/12/2018 09:55  Hemoglobin A1C Latest Ref Range: 4.8 - 5.6 % 6.9 (H)    Home DM Meds: Metformin 1000 mg BID  Current Orders: Novolog Sensitive Correction Scale/ SSI (0-9 units) TID AC + HS      Lantus 8 units QHS    Well controlled Diabetes at home as evidenced by Current A1c of 6.9%.     MD- Note home Metformin on hold.  Lantus 8 units QHS started last PM.  CBG still a bit elevated this AM (CBG 209 mg/dl).  Please consider increasing Lantus to 12 units QHS  Not sure if patient will be able to resume Metformin at time of discharge?  May need to consider other alternative medication for home use when patient ready to go home.  Could consider DPP-4 inhibitor medication like Tradjenta 5 mg Daily if Metformin not able to be resumed at time of d/c.     --Will follow patient during hospitalization--  Wyn Quaker RN, MSN, CDE Diabetes Coordinator Inpatient Glycemic Control Team Team Pager: [phone] (8a-5p)

## 2018-09-15 NOTE — Progress Notes (Signed)
CRITICAL CARE NOTE      SUBJECTIVE FINDINGS & SIGNIFICANT EVENTS   Patient remains critically ill Prognosis is guarded  Patient had trending down hemoglobin with ultimately requiring blood transfusion today.  Left knee has been aspirated by surgery today studies pending  PAST MEDICAL HISTORY   Past Medical History:  Diagnosis Date   Allergy    Anxiety    Arthritis    knees   Asthma, mild intermittent, well-controlled    Chronic obstructive asthma (HCC)    COPD (chronic obstructive pulmonary disease) (HCC)    Depression with anxiety    Diabetes (HCC)    GERD (gastroesophageal reflux disease)    HBP (high blood pressure)    High cholesterol    Renal cyst, right    Sleep apnea    CPAP   Swelling      SURGICAL HISTORY   Past Surgical History:  Procedure Laterality Date   BLADDER REPAIR     COLONOSCOPY WITH PROPOFOL N/A 04/07/2015   Procedure: COLONOSCOPY WITH PROPOFOL;  Surgeon: Lucilla Lame, MD;  Location: [facility];  Service: Endoscopy;  Laterality: N/A;  Diabetic - oral meds CPAP   HAND SURGERY     4TH AND 5TH FINGER   KNEE ARTHROSCOPY Right    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   VEIN SURGERY Right      FAMILY HISTORY   Family History  Problem Relation Age of Onset   Hypertension Mother    Diabetes Mother    Diabetes Father    Hypertension Father    Cancer Father    Seizures Daughter    Lupus Daughter    Cancer Maternal Grandfather        Prostate   Diabetes Paternal Grandfather    Prostate cancer Maternal Uncle    Bladder Cancer Neg Hx    Kidney cancer Neg Hx      SOCIAL HISTORY   Social History   Tobacco Use   Smoking status: Former Smoker    Packs/day: 1.00    Years: 10.00    Pack years: 10.00   Types: Cigarettes    Start date: 04/20/1995    Last attempt to quit: 04/19/2005    Years since quitting: 13.4   Smokeless tobacco: Never Used  Substance Use Topics   Alcohol use: Yes    Alcohol/week: 10.0 standard drinks    Types: 10 Cans of beer per week    Comment: occasional   Drug use: No     MEDICATIONS   Current Medication:  Current Facility-Administered Medications:    acetaminophen (TYLENOL) tablet 650 mg, 650 mg, Oral, Q6H PRN, 650 mg at 09/14/18 1527 **OR** acetaminophen (TYLENOL) suppository 650 mg, 650 mg, Rectal, Q6H PRN, Gladstone Lighter, MD   albuterol (PROVENTIL) (2.5 MG/3ML) 0.083% nebulizer solution 2.5 mg, 2.5 mg, Nebulization, Q6H, Kalisetti, Radhika, MD, 2.5 mg at 09/15/18 0242   aspirin EC tablet 81 mg, 81 mg, Oral, Daily, Tressia Miners, Radhika, MD, 81 mg at 09/14/18 1004   ceFEPIme (MAXIPIME) 2 g in sodium chloride 0.9 % 100 mL IVPB, 2 g, Intravenous, Q12H, Rocky Morel, RPH, Last Rate: 200 mL/hr at 09/14/18 2140, 2 g at 09/14/18 2140   Chlorhexidine Gluconate Cloth 2 % PADS 6 each, 6 each, Topical, Q0600, Darel Hong D, NP, 6 each at 09/15/18 0630   citalopram (CELEXA) tablet 20 mg, 20 mg, Oral, Daily, Kalisetti, Radhika, MD, 20 mg at 09/14/18 1004   docusate sodium (COLACE) capsule 100 mg, 100 mg, Oral,  BID, Gladstone Lighter, MD, 100 mg at 09/13/18 2106   fluticasone furoate-vilanterol (BREO ELLIPTA) 100-25 MCG/INH 1 puff, 1 puff, Inhalation, Daily, Gladstone Lighter, MD, 1 puff at 09/14/18 1015   HYDROcodone-acetaminophen (NORCO/VICODIN) 5-325 MG per tablet 1-2 tablet, 1-2 tablet, Oral, Q4H PRN, Gladstone Lighter, MD, 1 tablet at 09/15/18 0447   HYDROcodone-acetaminophen (NORCO/VICODIN) 5-325 MG per tablet 2 tablet, 2 tablet, Oral, Q4H PRN, Ottie Glazier, MD, 2 tablet at 09/14/18 0300   insulin aspart (novoLOG) injection 0-5 Units, 0-5 Units, Subcutaneous, QHS, Gladstone Lighter, MD, 3 Units at 09/14/18 2029   insulin aspart (novoLOG)  injection 0-9 Units, 0-9 Units, Subcutaneous, TID WC, Gladstone Lighter, MD, 2 Units at 09/14/18 1727   insulin glargine (LANTUS) injection 8 Units, 8 Units, Subcutaneous, QHS, Ottie Glazier, MD, 8 Units at 09/14/18 2135   loratadine (CLARITIN) tablet 10 mg, 10 mg, Oral, Daily, Gladstone Lighter, MD, 10 mg at 09/14/18 1004   norepinephrine (LEVOPHED) 16 mg in 24mL premix infusion, 0-40 mcg/min, Intravenous, Titrated, Awilda Bill, NP, Last Rate: 4.69 mL/hr at 09/15/18 0557, 5 mcg/min at 09/15/18 0557   ondansetron (ZOFRAN) tablet 4 mg, 4 mg, Oral, Q6H PRN **OR** ondansetron (ZOFRAN) injection 4 mg, 4 mg, Intravenous, Q6H PRN, Gladstone Lighter, MD   phenylephrine (NEO-SYNEPHRINE) 10 mg in sodium chloride 0.9 % 250 mL (0.04 mg/mL) infusion, 0-400 mcg/min, Intravenous, Titrated, Darel Hong D, NP, Stopped at 09/14/18 1305   rosuvastatin (CRESTOR) tablet 20 mg, 20 mg, Oral, Daily, Gladstone Lighter, MD, 20 mg at 09/14/18 1004   vancomycin (VANCOCIN) IVPB 1000 mg/200 mL premix, 1,000 mg, Intravenous, Q24H, Gladstone Lighter, MD, Stopped at 09/14/18 1520   vasopressin (PITRESSIN) 40 Units in sodium chloride 0.9 % 250 mL (0.16 Units/mL) infusion, 0.03 Units/min, Intravenous, Continuous, Blakeney, Dana G, NP, Last Rate: 11.25 mL/hr at 09/14/18 1700, 0.03 Units/min at 09/14/18 1700    ALLERGIES   Lipitor [atorvastatin]; Other; and Viagra  [sildenafil citrate]    REVIEW OF SYSTEMS    10 point review of systems conducted and is negative except as per subjective findings  PHYSICAL EXAMINATION   Vitals:   09/15/18 0500 09/15/18 0600  BP: (!) 86/64 107/61  Pulse: (!) 102 (!) 101  Resp: 19 19  Temp:    SpO2:      GENERAL: Mild distress due to acutely ill state HEAD: Normocephalic, atraumatic.  EYES: Pupils equal, round, reactive to light.  No scleral icterus.  MOUTH: Moist mucosal membrane. NECK: Supple. No thyromegaly. No nodules. No JVD.  PULMONARY: Mild bibasilar  atelectasis CARDIOVASCULAR: S1 and S2. Regular rate and rhythm. No murmurs, rubs, or gallops.  GASTROINTESTINAL: Soft, nontender, non-distended. No masses. Positive bowel sounds. No hepatosplenomegaly.  MUSCULOSKELETAL: No swelling, clubbing, or edema.  NEUROLOGIC: Mild distress due to acute illness SKIN:intact,warm,dry   LABS AND IMAGING     LAB RESULTS: Recent Labs  Lab 09/13/18 0344 09/14/18 0336 09/15/18 0436  NA 126* 126* 127*  K 4.1 3.8 3.6  CL 96* 92* 90*  CO2 18* 24 22  BUN 34* 29* 27*  CREATININE 4.31* 3.50* 2.82*  GLUCOSE 196* 247* 213*   Recent Labs  Lab 09/13/18 0344 09/14/18 0336 09/15/18 0436  HGB 8.7* 8.0* 7.5*  HCT 26.7* 24.5* 23.2*  WBC 9.4 5.9 5.5  PLT 137* 144* 158     IMAGING RESULTS: Ct Head Wo Contrast  Result Date: 09/14/2018 CLINICAL DATA:  Altered mental status. Hypertension. Symptoms began today. EXAM: CT HEAD WITHOUT CONTRAST TECHNIQUE: Contiguous axial images were obtained from the  base of the skull through the vertex without intravenous contrast. COMPARISON:  None. FINDINGS: Brain: The brain shows a normal appearance without evidence of malformation, atrophy, old or acute small or large vessel infarction, mass lesion, hemorrhage, hydrocephalus or extra-axial collection. Vascular: No hyperdense vessel. No evidence of atherosclerotic calcification. Skull: Normal.  No traumatic finding.  No focal bone lesion. Sinuses/Orbits: Sinuses are clear. Orbits appear normal. Mastoids are clear. Other: None significant IMPRESSION: Normal head CT Electronically Signed   By: Nelson Chimes M.D.   On: 09/14/2018 15:21   Dg Chest Port 1 View  Result Date: 09/14/2018 CLINICAL DATA:  Central line placement EXAM: PORTABLE CHEST 1 VIEW COMPARISON:  09/14/2018 FINDINGS: There is a newly placed right-sided central venous catheter with tip terminating over the SVC. There is no pneumothorax. The lung volumes are low. The heart size is enlarged. There are hazy opacities  bilaterally with prominent interstitial lung markings. There is a small right-sided pleural effusion. There is likely a trace left-sided pleural effusion. IMPRESSION: 1. Right-sided central venous catheter as above with no pneumothorax. 2. Low lung volumes. 3. Borderline enlarged heart. 4. Prominent interstitial lung markings and a few hazy ground-glass airspace opacities bilaterally are nonspecific but can be seen in patients with pulmonary edema or an atypical infectious process. 5. Developing small right-sided pleural effusion. Electronically Signed   By: Constance Holster M.D.   On: 09/14/2018 13:47   Dg Chest Port 1 View  Result Date: 09/14/2018 CLINICAL DATA:  Dyspnea EXAM: PORTABLE CHEST 1 VIEW COMPARISON:  09/12/2018 chest radiograph. FINDINGS: Low lung volumes. Stable cardiomediastinal silhouette with normal heart size. No pneumothorax. No pleural effusion. No overt pulmonary edema. No acute consolidative airspace disease. IMPRESSION: Low lung volumes, without active cardiopulmonary disease. Electronically Signed   By: Ilona Sorrel M.D.   On: 09/14/2018 10:56      ASSESSMENT AND PLAN      -Multidisciplinary rounds held today   Septic shock -Possibly due to urinary tract infection with severe AKI stage IV -concern forpossiblePJI of bilateral knees as they are hot to touch, swollen/tender, patient with fevers, chills hypotension  - on Vancomycing and cefepime IV -pt likely needs joint fluid studies to evalutae for PJI  - will place ID consult - appreciate input - orthopedics on case- appreciate input -follow ABG and LA -follow up cultures Leukocytosis improved on antibiotics    Acute blood loss anemia -status post PRBC transfusion     -No obvious source of grossly appreciable bleeding     -3 BMs yesteday - will send for occult blood next BM    -Monitor H&H     - will consider DIC - fibrinogen is adequate     - pathology slide consultation -peripheral blood film  normal     Acute kidney injury stage IV -DC nonessential nephrotoxic meds -Nephrology consultation- GFR improved overnight -Status post IV fluid rehydration-net positive 8 L -Developing crackles at the bases- cxr pending    Diabetes mellitus with uncontrolled hyperglycemia   -On insulin sliding scale AC at bedtime   -Starting nightly long-acting Lantus 8 units subcu for now    ID -continue IV abx as prescibed -follow up cultures  GI/Nutrition GI PROPHYLAXIS as indicated DIET-->TF's as tolerated Constipation protocol as indicated  ENDO - ICU hypoglycemic\Hyperglycemia protocol -check FSBS per protocol   ELECTROLYTES -follow labs as needed -replace as needed -pharmacy consultation   DVT/GI PRX ordered -SCDs  TRANSFUSIONS AS NEEDED MONITOR FSBS ASSESS the need for LABS as needed  Critical care provider statement:  Critical care time (minutes):33 Critical care time was exclusive of: Separately billable procedures and treating other patients Critical care was necessary to treat or prevent imminent or life-threatening deterioration of the following conditions: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 I assumed direction of critical care for this patient from another provider in my specialty: no    Ottie Glazier, M.D.  Division of [place]

## 2018-09-15 NOTE — Progress Notes (Signed)
Updated wife and allowed patient to speak with wife. Wife was concerned that no meal had been documented and pt history of DM. Reassured wife that patient had a diet ordered and that we would be monitoring blood sugar closely. She also verified that patient wears cpap at night.

## 2018-09-15 NOTE — Progress Notes (Signed)
Updated pts wife via telephone regarding plan of care and all questions were answered will continue to monitor and assess pt  Marda Stalker, [place] Pager [phone] (please enter 7 digits) PCCM Consult Pager [phone] (please enter 7 digits)

## 2018-09-16 LAB — VANCOMYCIN, PEAK: Vancomycin Pk: 22 ug/mL — ABNORMAL LOW (ref 30–40)

## 2018-09-16 LAB — CBC WITH DIFFERENTIAL/PLATELET
Abs Immature Granulocytes: 0.08 10*3/uL — ABNORMAL HIGH (ref 0.00–0.07)
Basophils Absolute: 0 10*3/uL (ref 0.0–0.1)
Basophils Relative: 0 %
Eosinophils Absolute: 0.2 10*3/uL (ref 0.0–0.5)
Eosinophils Relative: 3 %
HCT: 29.1 % — ABNORMAL LOW (ref 39.0–52.0)
Hemoglobin: 9.6 g/dL — ABNORMAL LOW (ref 13.0–17.0)
Immature Granulocytes: 1 %
Lymphocytes Relative: 21 %
Lymphs Abs: 1.4 10*3/uL (ref 0.7–4.0)
MCH: 26.8 pg (ref 26.0–34.0)
MCHC: 33 g/dL (ref 30.0–36.0)
MCV: 81.3 fL (ref 80.0–100.0)
Monocytes Absolute: 0.8 10*3/uL (ref 0.1–1.0)
Monocytes Relative: 11 %
Neutro Abs: 4.3 10*3/uL (ref 1.7–7.7)
Neutrophils Relative %: 64 %
Platelets: 193 10*3/uL (ref 150–400)
RBC: 3.58 MIL/uL — ABNORMAL LOW (ref 4.22–5.81)
RDW: 14.4 % (ref 11.5–15.5)
WBC: 6.8 10*3/uL (ref 4.0–10.5)
nRBC: 0 % (ref 0.0–0.2)

## 2018-09-16 LAB — SYNOVIAL CELL COUNT + DIFF, W/ CRYSTALS
Crystals, Fluid: NONE SEEN
Eosinophils-Synovial: 0 %
Lymphocytes-Synovial Fld: 6 %
Monocyte-Macrophage-Synovial Fluid: 6 %
Neutrophil, Synovial: 88 %
WBC, Synovial: 2581 /mm3 — ABNORMAL HIGH (ref 0–200)

## 2018-09-16 LAB — GLUCOSE, CAPILLARY
Glucose-Capillary: 185 mg/dL — ABNORMAL HIGH (ref 70–99)
Glucose-Capillary: 201 mg/dL — ABNORMAL HIGH (ref 70–99)
Glucose-Capillary: 191 mg/dL — ABNORMAL HIGH (ref 70–99)
Glucose-Capillary: 211 mg/dL — ABNORMAL HIGH (ref 70–99)

## 2018-09-16 LAB — COMPREHENSIVE METABOLIC PANEL
ALT: 59 U/L — ABNORMAL HIGH (ref 0–44)
AST: 143 U/L — ABNORMAL HIGH (ref 15–41)
Albumin: 2.1 g/dL — ABNORMAL LOW (ref 3.5–5.0)
Alkaline Phosphatase: 75 U/L (ref 38–126)
Anion gap: 14 (ref 5–15)
BUN: 20 mg/dL (ref 6–20)
CO2: 23 mmol/L (ref 22–32)
Calcium: 8.7 mg/dL — ABNORMAL LOW (ref 8.9–10.3)
Chloride: 96 mmol/L — ABNORMAL LOW (ref 98–111)
Creatinine, Ser: 2.2 mg/dL — ABNORMAL HIGH (ref 0.61–1.24)
GFR calc Af Amer: 36 mL/min — ABNORMAL LOW (ref >60)
GFR calc non Af Amer: 31 mL/min — ABNORMAL LOW (ref >60)
Glucose, Bld: 197 mg/dL — ABNORMAL HIGH (ref 70–99)
Potassium: 3.5 mmol/L (ref 3.5–5.1)
Sodium: 133 mmol/L — ABNORMAL LOW (ref 135–145)
Total Bilirubin: 0.8 mg/dL (ref 0.3–1.2)
Total Protein: 6.2 g/dL — ABNORMAL LOW (ref 6.5–8.1)

## 2018-09-16 LAB — PROCALCITONIN: Procalcitonin: 3.08 ng/mL

## 2018-09-16 NOTE — Progress Notes (Signed)
[place] [place] NAME: Isaac Cross    MR#:  [id number]  DATE OF BIRTH:  [birth year]  SUBJECTIVE:  CHIEF COMPLAINT:   Chief Complaint  Patient presents with   Dizziness   -Confusion persists though improving -Status post right knee fluid tap done today at bedside by orthopedics  REVIEW OF SYSTEMS:  Review of Systems  Constitutional: Positive for fever and malaise/fatigue. Negative for chills.  HENT: Negative for ear discharge, hearing loss, nosebleeds and sinus pain.   Eyes: Negative for blurred vision and double vision.  Respiratory: Negative for cough, shortness of breath and wheezing.   Cardiovascular: Negative for chest pain and palpitations.  Gastrointestinal: Negative for abdominal pain, constipation, diarrhea, nausea and vomiting.  Genitourinary: Negative for dysuria.  Musculoskeletal: Positive for joint pain and myalgias.  Neurological: Negative for dizziness, focal weakness, seizures, weakness and headaches.  Psychiatric/Behavioral: Negative for depression.    DRUG ALLERGIES:   Allergies  Allergen Reactions   Lipitor [Atorvastatin] Hives and Itching   Other    Viagra  [Sildenafil Citrate]     vision loss    VITALS:  Blood pressure 105/69, pulse (!) 117, temperature 99.2 F (37.3 C), temperature source Oral, resp. rate (!) 23, height 6\' 4"  (1.93 m), weight (!) 145.2 kg, SpO2 94 %.  PHYSICAL EXAMINATION:  Physical Exam   GENERAL:  58-year-old patient lying in the bed with no acute distress.  EYES: Pupils equal, round, reactive to light and accommodation. No scleral icterus. Extraocular muscles intact.  HEENT: Head atraumatic, normocephalic. Oropharynx and nasopharynx clear.  NECK:  Supple, no jugular venous distention. No thyroid enlargement, no tenderness.  LUNGS: Normal breath sounds bilaterally, no wheezing, rales,rhonchi or crepitation. No use of accessory muscles of respiration.  Decreased  bibasilar breath sounds CARDIOVASCULAR: S1, S2 normal. No murmurs, rubs, or gallops.  ABDOMEN: Soft, nontender, nondistended. Bowel sounds present. No organomegaly or mass.  EXTREMITIES: Swelling of both knees noted with recent surgery, Steri-Strips in place.  No pedal edema, cyanosis, or clubbing.  NEUROLOGIC: Cranial nerves II through XII are intact. Muscle strength 5/5 in all extremities. Sensation intact. Gait not checked.  Global weakness noted PSYCHIATRIC: The patient is alert and oriented x 3.  Some intermittent confusion still present SKIN: No obvious rash, lesion, or ulcer.    LABORATORY PANEL:   CBC Recent Labs  Lab 09/16/18 0459  WBC 6.8  HGB 9.6*  HCT 29.1*  PLT 193   ------------------------------------------------------------------------------------------------------------------  Chemistries  Recent Labs  Lab 09/16/18 0459  NA 133*  K 3.5  CL 96*  CO2 23  GLUCOSE 197*  BUN 20  CREATININE 2.20*  CALCIUM 8.7*  AST 143*  ALT 59*  ALKPHOS 75  BILITOT 0.8   ------------------------------------------------------------------------------------------------------------------  Cardiac Enzymes No results for input(s): TROPONINI in the last 168 hours. ------------------------------------------------------------------------------------------------------------------  RADIOLOGY:  Ct Head Wo Contrast  Result Date: 09/14/2018 CLINICAL DATA:  Altered mental status. Hypertension. Symptoms began today. EXAM: CT HEAD WITHOUT CONTRAST TECHNIQUE: Contiguous axial images were obtained from the base of the skull through the vertex without intravenous contrast. COMPARISON:  None. FINDINGS: Brain: The brain shows a normal appearance without evidence of malformation, atrophy, old or acute small or large vessel infarction, mass lesion, hemorrhage, hydrocephalus or extra-axial collection. Vascular: No hyperdense vessel. No evidence of atherosclerotic calcification. Skull: Normal.  No  traumatic finding.  No focal bone lesion. Sinuses/Orbits: Sinuses are clear. Orbits appear normal. Mastoids are clear. Other: None significant IMPRESSION:  Normal head CT Electronically Signed   By: Nelson Chimes M.D.   On: 09/14/2018 15:21   Ct Knee Left Wo Contrast  Result Date: 09/15/2018 CLINICAL DATA:  Knee replacement, bilateral knee pain.  Sepsis. EXAM: CT OF THE left KNEE WITHOUT CONTRAST TECHNIQUE: Multidetector CT imaging of the left knee was performed according to the standard protocol. Multiplanar CT image reconstructions were also generated. COMPARISON:  Radiographs 09/12/2018 FINDINGS: Bones/Joint/Cartilage Total knee prosthesis in place. No significant abnormal lucency along the metal-methacrylate it or methacrylate-bone interfaces at this time. There may be a tiny amount of gas just above the anterior portion of the tibial tray on image 53/2 which would be unusual for 2 weeks out from surgery, but the patient has had recent arthrocentesis or drain in place than back because for gas in the joint. Moderate to large joint effusion with some speckled calcifications along the margins of the suprapatellar bursa for example as shown on image 12/3. Free osteochondral fragments are present posteromedially in the joint along the margin of the polymer spacer. No complicating feature of the patellar component is identified. There is a moderate size Baker's cyst posteromedially. In addition there is a suggestion of a somewhat distended popliteus recess potentially companion containing at least 1 free osteochondral fragment. Ligaments Suboptimally assessed by CT. Muscles and Tendons There is appearance of at least partial disruption of the distal quadriceps tendon. Soft tissues Diffuse subcutaneous edema along the visualized portion of the distal thigh and proximal calf. IMPRESSION: 1. Moderate to large joint effusion, potentially with a tiny amount of loculated gas just above the anterior portion of the tibial  tray best appreciated on the axial images. This would be unusual 2 weeks out from surgery, and could possibly indicate infection, but if the patient has had recent arthrocentesis or drain placement in the joint then sterile gas may have been introduced at that time. Arthrocentesis could be utilized for definitive assessment of infection of the joint fluid. 2. There is no abnormal lucency along the margins of the implant. 3. Several free osteochondral fragments posteromedial to the joint line and also in the popliteus recess. 4. Partial disruption of the distal quadriceps tendon. Depending on the type of surgery employed (traditional versus minimally invasive) , this can be a normal postoperative finding. 5. Subcutaneous edema along the distal thigh and proximal calf. Electronically Signed   By: Van Clines M.D.   On: 09/15/2018 16:36   Ct Knee Right Wo Contrast  Result Date: 09/15/2018 CLINICAL DATA:  Bilateral knee pain. Sepsis. Knee replacement 2 weeks ago. EXAM: CT OF THE right KNEE WITHOUT CONTRAST TECHNIQUE: Multidetector CT imaging of the right knee was performed according to the standard protocol. Multiplanar CT image reconstructions were also generated. COMPARISON:  None. FINDINGS: Bones/Joint/Cartilage Right total knee prosthesis in place without abnormal lucency along the components the prosthesis to suggest loosening or bony findings of infection. No bony destructive findings are observed. No well-defined periprosthetic fracture. Expected alignment of components of the implant. Moderate-sized knee joint effusion with scattered calcifications along the margins of the suprapatellar bursa. No appreciable gas in the joint. Small well corticated ossicle along the tibial tubercle, likely from remote Osgood-Schlatter disease. Moderate-sized Baker's cyst. There is evidence of an old fibular shaft fracture which has healed. Small amount of chronic appearing heterotopic ossification along the posterior  margin of the soleus muscle. Ligaments Suboptimally assessed by CT. Muscles and Tendons Small amount of heterotopic calcification along the posterior margin of the soleus muscle. There  is some irregularity along the medial distal quadriceps possibly from partial release, correlate with operative history. The defect in the quadriceps is smaller than on the contralateral side. Soft tissues Moderate subcutaneous edema in the distal thigh and proximal calf. Medial subcutaneous varicosities in the proximal calf. IMPRESSION: 1. Moderate-sized knee joint effusion extending into the suprapatellar bursa. Moderate size Baker's cyst. No appreciable gas in the joint or bony destructive findings. No abnormal lucency along the implant. 2. Irregularity in the medial distal quadriceps tendon potentially from partial release or a port into the joint, correlate with operative history. 3. Well corticated ossicle in the distal patellar tendon likely from remote Osgood-Schlatter disease. 4. Moderate subcutaneous edema in the region. Electronically Signed   By: Van Clines M.D.   On: 09/15/2018 16:45   Dg Chest Port 1 View  Result Date: 09/14/2018 CLINICAL DATA:  Central line placement EXAM: PORTABLE CHEST 1 VIEW COMPARISON:  09/14/2018 FINDINGS: There is a newly placed right-sided central venous catheter with tip terminating over the SVC. There is no pneumothorax. The lung volumes are low. The heart size is enlarged. There are hazy opacities bilaterally with prominent interstitial lung markings. There is a small right-sided pleural effusion. There is likely a trace left-sided pleural effusion. IMPRESSION: 1. Right-sided central venous catheter as above with no pneumothorax. 2. Low lung volumes. 3. Borderline enlarged heart. 4. Prominent interstitial lung markings and a few hazy ground-glass airspace opacities bilaterally are nonspecific but can be seen in patients with pulmonary edema or an atypical infectious process. 5.  Developing small right-sided pleural effusion. Electronically Signed   By: Constance Holster M.D.   On: 09/14/2018 13:47    EKG:   Orders placed or performed during the hospital encounter of 09/12/18   EKG 12-Lead   EKG 12-Lead    ASSESSMENT AND PLAN:    EdwardGradyis a58 y.o.malewith a known history of arthritis, COPD not on home oxygen, hypertension, sleep apnea, non-insulin-dependent diabetes mellitus presents to hospital secondary to dizziness, weakness and a fall  1.Sepsis-with ongoing fevers.  Concern given recent bilateral knee replacement surgery. -CT of both knees showing moderate to large effusions.  Status post left knee arthrocentesis and fluid showing WBCs about 10,000.  Right knee has been tapped today and fluid sent for cultures. -Concern for infection and possible need for washout. -Appreciate Ortho and ID consults. -On minimal dose of Levophed at this time. -Blood cultures negative - urine cultures negative -Continue antibiotics with vancomycin and cefepime at this time  2. Acute renal failure-likely ATN from sepsis and recent use of Bactrim -Creatinine is slowly improving.  Urine output has improved significantly in the last 24 hours. -Renal ultrasound with no significant obstruction.  Appreciate nephrology consult. -Hold lisinopril and metformin and other nephrotoxins.  3.  Acute encephalopathy-secondary to metabolic causes, sepsis.  Monitor closely.  No focal deficits  4. Hypertension-hold lisinopril.  5. Diabetes mellitus- sliding scale insulin.  -Hold metformin given worsening renal failure.  6. DVT prophylaxis- SCD     All the records are reviewed and case discussed with Care Management/Social Workerr. Management plans discussed with the patient, family and they are in agreement.  CODE STATUS: Full code  TOTAL TIME TAKING CARE OF THIS PATIENT: 41 minutes.   POSSIBLE D/C IN 3-4 DAYS, DEPENDING ON CLINICAL CONDITION.   Gladstone Lighter M.D on 09/16/2018 at 1:10 PM  Between 7am to 6pm - Pager - [phone]  After 6pm go to [email or web address] - password [email or web address]  [employer]  Office  [phone]  CC: Primary care physician; Steele Sizer, MD

## 2018-09-16 NOTE — Progress Notes (Signed)
Subjective:  Patient reports pain as mild.   Objective:   VITALS:   Vitals:   09/16/18 1100 09/16/18 1200 09/16/18 1300 09/16/18 1400  BP: 90/71 (!) 83/60 (!) 103/54 110/69  Pulse: (!) 116 (!) 107 (!) 115 (!) 120  Resp: (!) 35 (!) 23 (!) 28 (!) 25  Temp:    (!) 100.9 F (38.3 C)  TempSrc:    Axillary  SpO2:    96%  Weight:      Height:        PHYSICAL EXAM:  Neurovascular intact Dorsiflexion/Plantar flexion intact Incision: dressing C/D/I No cellulitis present Compartment soft  LABS  Results for orders placed or performed during the hospital encounter of 09/12/18 (from the past 24 hour(s))  Synovial cell count + diff, w/ crystals     Status: Abnormal   Collection Time: 09/15/18  5:13 PM  Result Value Ref Range   Color, Synovial RED (A) YELLOW   Appearance-Synovial CLOUDY (A) CLEAR   Crystals, Fluid NO CRYSTALS SEEN    WBC, Synovial 9,690 (H) 0 - 200 /cu mm   Neutrophil, Synovial 92 %   Lymphocytes-Synovial Fld 7 %   Monocyte-Macrophage-Synovial Fluid 1 %   Eosinophils-Synovial 0 %  Body fluid culture     Status: None (Preliminary result)   Collection Time: 09/15/18  5:13 PM  Result Value Ref Range   Specimen Description      KNEE Performed at [facility], [address], [place], [place] [address]    Special Requests      NONE Performed at [facility], [address] [place], [place] [address]    Gram Stain      RARE RBCS FEW WBC SEEN NO ORGANISMS SEEN Performed at [facility], [address] [place], [place] [address]    Culture      NO GROWTH < 24 HOURS Performed at [facility], [place] [address] [place], [place] [address]    Report Status PENDING   TSH     Status: Abnormal   Collection Time: 09/15/18  5:36 PM  Result Value Ref Range   TSH 5.055 (H) 0.350 - 4.500 uIU/mL  Hemoglobin and hematocrit, blood     Status: Abnormal   Collection Time: 09/15/18  5:47 PM  Result Value Ref Range   Hemoglobin 5.7  (L) 13.0 - 17.0 g/dL   HCT 17.4 (L) 39.0 - 52.0 %  Prepare RBC     Status: None   Collection Time: 09/15/18  6:22 PM  Result Value Ref Range   Order Confirmation      ORDER PROCESSED BY BLOOD BANK Performed at [facility], [address] [place], [place] [address]   Protime-INR     Status: None   Collection Time: 09/15/18  6:32 PM  Result Value Ref Range   Prothrombin Time 13.8 11.4 - 15.2 seconds   INR 1.1 0.8 - 1.2  Hemoglobin and hematocrit, blood     Status: Abnormal   Collection Time: 09/15/18  6:32 PM  Result Value Ref Range   Hemoglobin 9.3 (L) 13.0 - 17.0 g/dL   HCT 28.3 (L) 39.0 - 52.0 %  Glucose, capillary     Status: Abnormal   Collection Time: 09/15/18 10:39 PM  Result Value Ref Range   Glucose-Capillary 187 (H) 70 - 99 mg/dL  Hemoglobin and hematocrit, blood     Status: Abnormal   Collection Time: 09/15/18 10:55 PM  Result Value Ref Range   Hemoglobin  9.4 (L) 13.0 - 17.0 g/dL   HCT 28.4 (L) 39.0 - 52.0 %  Procalcitonin     Status: None   Collection Time: 09/16/18  4:59 AM  Result Value Ref Range   Procalcitonin 3.08 ng/mL  Comprehensive metabolic panel     Status: Abnormal   Collection Time: 09/16/18  4:59 AM  Result Value Ref Range   Sodium 133 (L) 135 - 145 mmol/L   Potassium 3.5 3.5 - 5.1 mmol/L   Chloride 96 (L) 98 - 111 mmol/L   CO2 23 22 - 32 mmol/L   Glucose, Bld 197 (H) 70 - 99 mg/dL   BUN 20 6 - 20 mg/dL   Creatinine, Ser 2.20 (H) 0.61 - 1.24 mg/dL   Calcium 8.7 (L) 8.9 - 10.3 mg/dL   Total Protein 6.2 (L) 6.5 - 8.1 g/dL   Albumin 2.1 (L) 3.5 - 5.0 g/dL   AST 143 (H) 15 - 41 U/L   ALT 59 (H) 0 - 44 U/L   Alkaline Phosphatase 75 38 - 126 U/L   Total Bilirubin 0.8 0.3 - 1.2 mg/dL   GFR calc non Af Amer 31 (L) >60 mL/min   GFR calc Af Amer 36 (L) >60 mL/min   Anion gap 14 5 - 15  CBC with Differential/Platelet     Status: Abnormal   Collection Time: 09/16/18  4:59 AM  Result Value Ref Range   WBC 6.8 4.0 - 10.5 K/uL   RBC 3.58 (L)  4.22 - 5.81 MIL/uL   Hemoglobin 9.6 (L) 13.0 - 17.0 g/dL   HCT 29.1 (L) 39.0 - 52.0 %   MCV 81.3 80.0 - 100.0 fL   MCH 26.8 26.0 - 34.0 pg   MCHC 33.0 30.0 - 36.0 g/dL   RDW 14.4 11.5 - 15.5 %   Platelets 193 150 - 400 K/uL   nRBC 0.0 0.0 - 0.2 %   Neutrophils Relative % 64 %   Neutro Abs 4.3 1.7 - 7.7 K/uL   Lymphocytes Relative 21 %   Lymphs Abs 1.4 0.7 - 4.0 K/uL   Monocytes Relative 11 %   Monocytes Absolute 0.8 0.1 - 1.0 K/uL   Eosinophils Relative 3 %   Eosinophils Absolute 0.2 0.0 - 0.5 K/uL   Basophils Relative 0 %   Basophils Absolute 0.0 0.0 - 0.1 K/uL   Immature Granulocytes 1 %   Abs Immature Granulocytes 0.08 (H) 0.00 - 0.07 K/uL  Glucose, capillary     Status: Abnormal   Collection Time: 09/16/18  7:29 AM  Result Value Ref Range   Glucose-Capillary 185 (H) 70 - 99 mg/dL  Synovial cell count + diff, w/ crystals     Status: Abnormal   Collection Time: 09/16/18  8:55 AM  Result Value Ref Range   Color, Synovial YELLOW YELLOW   Appearance-Synovial HAZY (A) CLEAR   Crystals, Fluid NONE SEEN    WBC, Synovial 2,581 (H) 0 - 200 /cu mm   Neutrophil, Synovial 88 %   Lymphocytes-Synovial Fld 6 %   Monocyte-Macrophage-Synovial Fluid 6 %   Eosinophils-Synovial 0 %  Body fluid culture     Status: None (Preliminary result)   Collection Time: 09/16/18  8:55 AM  Result Value Ref Range   Specimen Description KNEE RIGHT    Special Requests NONE    Gram Stain      RARE WBC PRESENT, PREDOMINANTLY PMN NO ORGANISMS SEEN Performed at [facility], [address], [place], [place] [address]  Culture PENDING    Report Status PENDING   Glucose, capillary     Status: Abnormal   Collection Time: 09/16/18 11:31 AM  Result Value Ref Range   Glucose-Capillary 201 (H) 70 - 99 mg/dL  Glucose, capillary     Status: Abnormal   Collection Time: 09/16/18  3:42 PM  Result Value Ref Range   Glucose-Capillary 191 (H) 70 - 99 mg/dL    Ct Knee Left Wo Contrast  Result Date:  09/15/2018 CLINICAL DATA:  Knee replacement, bilateral knee pain.  Sepsis. EXAM: CT OF THE left KNEE WITHOUT CONTRAST TECHNIQUE: Multidetector CT imaging of the left knee was performed according to the standard protocol. Multiplanar CT image reconstructions were also generated. COMPARISON:  Radiographs 09/12/2018 FINDINGS: Bones/Joint/Cartilage Total knee prosthesis in place. No significant abnormal lucency along the metal-methacrylate it or methacrylate-bone interfaces at this time. There may be a tiny amount of gas just above the anterior portion of the tibial tray on image 53/2 which would be unusual for 2 weeks out from surgery, but the patient has had recent arthrocentesis or drain in place than back because for gas in the joint. Moderate to large joint effusion with some speckled calcifications along the margins of the suprapatellar bursa for example as shown on image 12/3. Free osteochondral fragments are present posteromedially in the joint along the margin of the polymer spacer. No complicating feature of the patellar component is identified. There is a moderate size Baker's cyst posteromedially. In addition there is a suggestion of a somewhat distended popliteus recess potentially companion containing at least 1 free osteochondral fragment. Ligaments Suboptimally assessed by CT. Muscles and Tendons There is appearance of at least partial disruption of the distal quadriceps tendon. Soft tissues Diffuse subcutaneous edema along the visualized portion of the distal thigh and proximal calf. IMPRESSION: 1. Moderate to large joint effusion, potentially with a tiny amount of loculated gas just above the anterior portion of the tibial tray best appreciated on the axial images. This would be unusual 2 weeks out from surgery, and could possibly indicate infection, but if the patient has had recent arthrocentesis or drain placement in the joint then sterile gas may have been introduced at that time. Arthrocentesis  could be utilized for definitive assessment of infection of the joint fluid. 2. There is no abnormal lucency along the margins of the implant. 3. Several free osteochondral fragments posteromedial to the joint line and also in the popliteus recess. 4. Partial disruption of the distal quadriceps tendon. Depending on the type of surgery employed (traditional versus minimally invasive) , this can be a normal postoperative finding. 5. Subcutaneous edema along the distal thigh and proximal calf. Electronically Signed   By: Van Clines M.D.   On: 09/15/2018 16:36   Ct Knee Right Wo Contrast  Result Date: 09/15/2018 CLINICAL DATA:  Bilateral knee pain. Sepsis. Knee replacement 2 weeks ago. EXAM: CT OF THE right KNEE WITHOUT CONTRAST TECHNIQUE: Multidetector CT imaging of the right knee was performed according to the standard protocol. Multiplanar CT image reconstructions were also generated. COMPARISON:  None. FINDINGS: Bones/Joint/Cartilage Right total knee prosthesis in place without abnormal lucency along the components the prosthesis to suggest loosening or bony findings of infection. No bony destructive findings are observed. No well-defined periprosthetic fracture. Expected alignment of components of the implant. Moderate-sized knee joint effusion with scattered calcifications along the margins of the suprapatellar bursa. No appreciable gas in the joint. Small well corticated ossicle along the tibial tubercle, likely from remote  Osgood-Schlatter disease. Moderate-sized Baker's cyst. There is evidence of an old fibular shaft fracture which has healed. Small amount of chronic appearing heterotopic ossification along the posterior margin of the soleus muscle. Ligaments Suboptimally assessed by CT. Muscles and Tendons Small amount of heterotopic calcification along the posterior margin of the soleus muscle. There is some irregularity along the medial distal quadriceps possibly from partial release, correlate  with operative history. The defect in the quadriceps is smaller than on the contralateral side. Soft tissues Moderate subcutaneous edema in the distal thigh and proximal calf. Medial subcutaneous varicosities in the proximal calf. IMPRESSION: 1. Moderate-sized knee joint effusion extending into the suprapatellar bursa. Moderate size Baker's cyst. No appreciable gas in the joint or bony destructive findings. No abnormal lucency along the implant. 2. Irregularity in the medial distal quadriceps tendon potentially from partial release or a port into the joint, correlate with operative history. 3. Well corticated ossicle in the distal patellar tendon likely from remote Osgood-Schlatter disease. 4. Moderate subcutaneous edema in the region. Electronically Signed   By: Van Clines M.D.   On: 09/15/2018 16:45    Assessment/Plan:     Active Problems:   Sepsis ([place])   The CT, lab values and synovial analysis are all reviewed. There has been no growth in any of the cultures, however he continues to be tachycardic, hypotensive and spiking fevers. The left knee analysis is suspicious for infection and I discussed the possibility of left knee irrigation and debridement with the patient and his wife and they agree to proceed. Will also perform a polyethylene exchange at the time of surgery. This is set for tomorrow morning.   The diagnosis, risks, benefits and alternatives to treatment are all discussed in detail with the patient and family. Risks include but are not limited to bleeding, infection, deep vein thrombosis, pulmonary embolism, nerve or vascular injury, repeat operation, persistent pain, persistent infection, weakness, stiffness and death. They understand and are eager to proceed.    Lovell Sheehan , MD 09/16/2018, 4:32 PM

## 2018-09-16 NOTE — Progress Notes (Signed)
Arthrocentesis of left knee showing about 9000 WBCs, patient still spiking low-grade fevers in spite of IV antibiotics for more than 3 days. -Cultures are negative though.  Discussed with infectious disease physician who has recommended likely left knee washout might help.

## 2018-09-16 NOTE — Progress Notes (Signed)
Patient is confused, his spouse wants the doctor to speak with her and patient at the same time tomorrow morning on speaker phone to explain the procedure.  She feels he needs it, but wants to be reassured that his vital signs support being put under again.  Please note that the patient seems oriented but forgets and gets confused.  Spouse is expecting a call from the doctor in the morning before the procedure to get consent.  Phillis Knack, RN

## 2018-09-16 NOTE — Progress Notes (Signed)
[facility]  ROUNDING NOTE   Subjective:   UOP 4225 Tmax 100.9  Arthrocentesis yesterday.   Norepinephrine gtt  Mental status has improved.   Objective:  Vital signs in last 24 hours:  Temp:  [98.7 F (37.1 C)-100.9 F (38.3 C)] 99.2 F (37.3 C) (05/30 0425) Pulse Rate:  [77-135] 117 (05/30 0700) Resp:  [15-35] 23 (05/30 0700) BP: (104-135)/(61-96) 105/69 (05/30 0700) SpO2:  [85 %-100 %] 94 % (05/30 0700) Arterial Line BP: (91-144)/(44-74) 124/67 (05/30 0700)  Weight change:  Filed Weights   09/12/18 0935  Weight: (!) 145.2 kg    Intake/Output: I/O last 3 completed shifts: In: 1554.6 [I.V.:414.8; Blood:640; IV Piggyback:499.9] Out: 1610 [Urine:5725]   Intake/Output this shift:  No intake/output data recorded.  Physical Exam: General: NAD, ill appearing  Head: Normocephalic, atraumatic. Moist oral mucosal membranes  Eyes: Anicteric, PERRL  Neck: Supple, trachea midline  Lungs:  Clear to auscultation  Heart: tachycardia  Abdomen:  Soft, nontender, obese  Extremities:  ++ peripheral edema.  Neurologic: Alert and oriented  Skin: No lesions        Basic Metabolic Panel: Recent Labs  Lab 09/12/18 0955 09/13/18 0344 09/14/18 0336 09/15/18 0436 09/16/18 0459  NA 126* 126* 126* 127* 133*  K 4.4 4.1 3.8 3.6 3.5  CL 90* 96* 92* 90* 96*  CO2 17* 18* 24 22 23   GLUCOSE 129* 196* 247* 213* 197*  BUN 28* 34* 29* 27* 20  CREATININE 3.71* 4.31* 3.50* 2.82* 2.20*  CALCIUM 9.7 7.9* 7.8* 8.3* 8.7*    Liver Function Tests: Recent Labs  Lab 09/12/18 0955 09/15/18 0436 09/16/18 0459  AST 70* 148* 143*  ALT 42 57* 59*  ALKPHOS 86 70 75  BILITOT 0.7 0.7 0.8  PROT 7.6 5.8* 6.2*  ALBUMIN 3.0* 1.9* 2.1*   Recent Labs  Lab 09/12/18 0955  LIPASE 74*   No results for input(s): AMMONIA in the last 168 hours.  CBC: Recent Labs  Lab 09/12/18 0955 09/13/18 0344 09/14/18 0336 09/15/18 0436 09/15/18 1747 09/15/18 1832 09/15/18 2255  09/16/18 0459  WBC 16.1* 9.4 5.9 5.5  --   --   --  6.8  NEUTROABS 10.3*  --  3.5 3.0  --   --   --  4.3  HGB 11.5* 8.7* 8.0* 7.5* 5.7* 9.3* 9.4* 9.6*  HCT 35.7* 26.7* 24.5* 23.2* 17.4* 28.3* 28.4* 29.1*  MCV 82.4 81.9 81.9 82.0  --   --   --  81.3  PLT 137* 137* 144* 158  --   --   --  193    Cardiac Enzymes: Recent Labs  Lab 09/13/18 0344  CKTOTAL 4,516*    BNP: Invalid input(s): POCBNP  CBG: Recent Labs  Lab 09/15/18 0731 09/15/18 1151 09/15/18 1557 09/15/18 2239 09/16/18 0729  GLUCAP 209* 250* 243* 187* 185*    Microbiology: Results for orders placed or performed during the hospital encounter of 09/12/18  Blood Culture (routine x 2)     Status: None (Preliminary result)   Collection Time: 09/12/18  9:56 AM  Result Value Ref Range Status   Specimen Description BLOOD LEFT HAND  Final   Special Requests   Final    BOTTLES DRAWN AEROBIC AND ANAEROBIC Blood Culture adequate volume   Culture   Final    NO GROWTH 4 DAYS Performed at [facility], [address], [place], [place] [address]    Report Status PENDING  Incomplete  Urine culture     Status: None  Collection Time: 09/12/18 10:01 AM  Result Value Ref Range Status   Specimen Description   Final    URINE, RANDOM Performed at [facility], [address] [place], [place] [address]    Special Requests   Final    NONE Performed at [facility], [address] [place], [place] [address]    Culture   Final    NO GROWTH Performed at [facility], [place] [address], [place], [place] [address]    Report Status 09/13/2018 FINAL  Final  SARS Coronavirus 2 (CEPHEID- Performed in [place] hospital lab), Hosp Order     Status: None   Collection Time: 09/12/18 10:02 AM  Result Value Ref Range Status   SARS Coronavirus 2 NEGATIVE NEGATIVE Final    Comment: (NOTE) If result is NEGATIVE SARS-CoV-2 target nucleic acids are NOT DETECTED. The SARS-CoV-2 RNA is generally  detectable in upper and lower  respiratory specimens during the acute phase of infection. The lowest  concentration of SARS-CoV-2 viral copies this assay can detect is 250  copies / mL. A negative result does not preclude SARS-CoV-2 infection  and should not be used as the sole basis for treatment or other  patient management decisions.  A negative result may occur with  improper specimen collection / handling, submission of specimen other  than nasopharyngeal swab, presence of viral mutation(s) within the  areas targeted by this assay, and inadequate number of viral copies  (<250 copies / mL). A negative result must be combined with clinical  observations, patient history, and epidemiological information. If result is POSITIVE SARS-CoV-2 target nucleic acids are DETECTED. The SARS-CoV-2 RNA is generally detectable in upper and lower  respiratory specimens dur ing the acute phase of infection.  Positive  results are indicative of active infection with SARS-CoV-2.  Clinical  correlation with patient history and other diagnostic information is  necessary to determine patient infection status.  Positive results do  not rule out bacterial infection or co-infection with other viruses. If result is PRESUMPTIVE POSTIVE SARS-CoV-2 nucleic acids MAY BE PRESENT.   A presumptive positive result was obtained on the submitted specimen  and confirmed on repeat testing.  While 2019 novel coronavirus  (SARS-CoV-2) nucleic acids may be present in the submitted sample  additional confirmatory testing may be necessary for epidemiological  and / or clinical management purposes  to differentiate between  SARS-CoV-2 and other Sarbecovirus currently known to infect humans.  If clinically indicated additional testing with an alternate test  methodology [phone]) is advised. The SARS-CoV-2 RNA is generally  detectable in upper and lower respiratory sp ecimens during the acute  phase of infection. The  expected result is Negative. Fact Sheet for Patients:  StrictlyIdeas.no Fact Sheet for Healthcare Providers: BankingDealers.co.za This test is not yet approved or cleared by the [place] FDA and has been authorized for detection and/or diagnosis of SARS-CoV-2 by FDA under an Emergency Use Authorization (EUA).  This EUA will remain in effect (meaning this test can be used) for the duration of the COVID-19 declaration under Section 564(b)(1) of the Act, 21 U.S.C. section 360bbb-3(b)(1), unless the authorization is terminated or revoked sooner. Performed at [facility], [address] [place], [place] [address]   Blood Culture (routine x 2)     Status: None (Preliminary result)   Collection Time: 09/12/18 10:06 AM  Result Value Ref Range Status   Specimen Description BLOOD LEFT ANTECUBITAL  Final   Special Requests  Final    BOTTLES DRAWN AEROBIC AND ANAEROBIC Blood Culture adequate volume   Culture   Final    NO GROWTH 4 DAYS Performed at [facility], [address] [place], [place] [address]    Report Status PENDING  Incomplete  MRSA PCR Screening     Status: None   Collection Time: 09/12/18  7:04 PM  Result Value Ref Range Status   MRSA by PCR NEGATIVE NEGATIVE Final    Comment:        The GeneXpert MRSA Assay (FDA approved for NASAL specimens only), is one component of a comprehensive MRSA colonization surveillance program. It is not intended to diagnose MRSA infection nor to guide or monitor treatment for MRSA infections. Performed at [facility] [address] [place], [place] [address]   C difficile quick scan w PCR reflex     Status: None   Collection Time: 09/14/18  2:21 PM  Result Value Ref Range Status   C Diff antigen NEGATIVE NEGATIVE Final   C Diff toxin NEGATIVE NEGATIVE Final   C Diff interpretation No C. difficile detected.  Final    Comment: Performed at [facility], [address] [place], [place] [address]  Group A Strep by PCR     Status: None   Collection Time: 09/15/18  1:53 PM  Result Value Ref Range Status   Group A Strep by PCR NOT DETECTED NOT DETECTED Final    Comment: Performed at [facility], [address] [place], [place] [address]  Body fluid culture     Status: None (Preliminary result)   Collection Time: 09/15/18  5:13 PM  Result Value Ref Range Status   Specimen Description KNEE  Final   Special Requests NONE  Final   Gram Stain   Final    RARE RBCS FEW WBC SEEN NO ORGANISMS SEEN Performed at [facility], [address] [place], [place] [address]    Culture PENDING  Incomplete   Report Status PENDING  Incomplete    Coagulation Studies: Recent Labs    09/14/18 1121 09/15/18 1832  LABPROT 15.7* 13.8  INR 1.3* 1.1    Urinalysis: No results for input(s): COLORURINE, LABSPEC, PHURINE, GLUCOSEU, HGBUR, BILIRUBINUR, KETONESUR, PROTEINUR, UROBILINOGEN, NITRITE, LEUKOCYTESUR in the last 72 hours.  Invalid input(s): APPERANCEUR    Imaging: Ct Head Wo Contrast  Result Date: 09/14/2018 CLINICAL DATA:  Altered mental status. Hypertension. Symptoms began today. EXAM: CT HEAD WITHOUT CONTRAST TECHNIQUE: Contiguous axial images were obtained from the base of the skull through the vertex without intravenous contrast. COMPARISON:  None. FINDINGS: Brain: The brain shows a normal appearance without evidence of malformation, atrophy, old or acute small or large vessel infarction, mass lesion, hemorrhage, hydrocephalus or extra-axial collection. Vascular: No hyperdense vessel. No evidence of atherosclerotic calcification. Skull: Normal.  No traumatic finding.  No focal bone lesion. Sinuses/Orbits: Sinuses are clear. Orbits appear normal. Mastoids are clear. Other: None significant IMPRESSION: Normal head CT Electronically Signed   By: Nelson Chimes M.D.   On: 09/14/2018 15:21   Ct Knee Left Wo  Contrast  Result Date: 09/15/2018 CLINICAL DATA:  Knee replacement, bilateral knee pain.  Sepsis. EXAM: CT OF THE left KNEE WITHOUT CONTRAST TECHNIQUE: Multidetector CT imaging of the left knee was performed according to the standard protocol. Multiplanar CT image reconstructions were also generated. COMPARISON:  Radiographs 09/12/2018 FINDINGS: Bones/Joint/Cartilage Total knee prosthesis in place. No significant abnormal lucency along the metal-methacrylate it or methacrylate-bone interfaces  at this time. There may be a tiny amount of gas just above the anterior portion of the tibial tray on image 53/2 which would be unusual for 2 weeks out from surgery, but the patient has had recent arthrocentesis or drain in place than back because for gas in the joint. Moderate to large joint effusion with some speckled calcifications along the margins of the suprapatellar bursa for example as shown on image 12/3. Free osteochondral fragments are present posteromedially in the joint along the margin of the polymer spacer. No complicating feature of the patellar component is identified. There is a moderate size Baker's cyst posteromedially. In addition there is a suggestion of a somewhat distended popliteus recess potentially companion containing at least 1 free osteochondral fragment. Ligaments Suboptimally assessed by CT. Muscles and Tendons There is appearance of at least partial disruption of the distal quadriceps tendon. Soft tissues Diffuse subcutaneous edema along the visualized portion of the distal thigh and proximal calf. IMPRESSION: 1. Moderate to large joint effusion, potentially with a tiny amount of loculated gas just above the anterior portion of the tibial tray best appreciated on the axial images. This would be unusual 2 weeks out from surgery, and could possibly indicate infection, but if the patient has had recent arthrocentesis or drain placement in the joint then sterile gas may have been introduced at  that time. Arthrocentesis could be utilized for definitive assessment of infection of the joint fluid. 2. There is no abnormal lucency along the margins of the implant. 3. Several free osteochondral fragments posteromedial to the joint line and also in the popliteus recess. 4. Partial disruption of the distal quadriceps tendon. Depending on the type of surgery employed (traditional versus minimally invasive) , this can be a normal postoperative finding. 5. Subcutaneous edema along the distal thigh and proximal calf. Electronically Signed   By: Van Clines M.D.   On: 09/15/2018 16:36   Ct Knee Right Wo Contrast  Result Date: 09/15/2018 CLINICAL DATA:  Bilateral knee pain. Sepsis. Knee replacement 2 weeks ago. EXAM: CT OF THE right KNEE WITHOUT CONTRAST TECHNIQUE: Multidetector CT imaging of the right knee was performed according to the standard protocol. Multiplanar CT image reconstructions were also generated. COMPARISON:  None. FINDINGS: Bones/Joint/Cartilage Right total knee prosthesis in place without abnormal lucency along the components the prosthesis to suggest loosening or bony findings of infection. No bony destructive findings are observed. No well-defined periprosthetic fracture. Expected alignment of components of the implant. Moderate-sized knee joint effusion with scattered calcifications along the margins of the suprapatellar bursa. No appreciable gas in the joint. Small well corticated ossicle along the tibial tubercle, likely from remote Osgood-Schlatter disease. Moderate-sized Baker's cyst. There is evidence of an old fibular shaft fracture which has healed. Small amount of chronic appearing heterotopic ossification along the posterior margin of the soleus muscle. Ligaments Suboptimally assessed by CT. Muscles and Tendons Small amount of heterotopic calcification along the posterior margin of the soleus muscle. There is some irregularity along the medial distal quadriceps possibly from  partial release, correlate with operative history. The defect in the quadriceps is smaller than on the contralateral side. Soft tissues Moderate subcutaneous edema in the distal thigh and proximal calf. Medial subcutaneous varicosities in the proximal calf. IMPRESSION: 1. Moderate-sized knee joint effusion extending into the suprapatellar bursa. Moderate size Baker's cyst. No appreciable gas in the joint or bony destructive findings. No abnormal lucency along the implant. 2. Irregularity in the medial distal quadriceps tendon potentially from partial release  or a port into the joint, correlate with operative history. 3. Well corticated ossicle in the distal patellar tendon likely from remote Osgood-Schlatter disease. 4. Moderate subcutaneous edema in the region. Electronically Signed   By: Van Clines M.D.   On: 09/15/2018 16:45   Dg Chest Port 1 View  Result Date: 09/14/2018 CLINICAL DATA:  Central line placement EXAM: PORTABLE CHEST 1 VIEW COMPARISON:  09/14/2018 FINDINGS: There is a newly placed right-sided central venous catheter with tip terminating over the SVC. There is no pneumothorax. The lung volumes are low. The heart size is enlarged. There are hazy opacities bilaterally with prominent interstitial lung markings. There is a small right-sided pleural effusion. There is likely a trace left-sided pleural effusion. IMPRESSION: 1. Right-sided central venous catheter as above with no pneumothorax. 2. Low lung volumes. 3. Borderline enlarged heart. 4. Prominent interstitial lung markings and a few hazy ground-glass airspace opacities bilaterally are nonspecific but can be seen in patients with pulmonary edema or an atypical infectious process. 5. Developing small right-sided pleural effusion. Electronically Signed   By: Constance Holster M.D.   On: 09/14/2018 13:47   Dg Chest Port 1 View  Result Date: 09/14/2018 CLINICAL DATA:  Dyspnea EXAM: PORTABLE CHEST 1 VIEW COMPARISON:  09/12/2018 chest  radiograph. FINDINGS: Low lung volumes. Stable cardiomediastinal silhouette with normal heart size. No pneumothorax. No pleural effusion. No overt pulmonary edema. No acute consolidative airspace disease. IMPRESSION: Low lung volumes, without active cardiopulmonary disease. Electronically Signed   By: Ilona Sorrel M.D.   On: 09/14/2018 10:56     Medications:   . ceFEPime (MAXIPIME) IV Stopped (09/15/18 2331)  . norepinephrine (LEVOPHED) Adult infusion 3 mcg/min (09/16/18 0600)  . phenylephrine (NEO-SYNEPHRINE) Adult infusion Stopped (09/14/18 1305)  . vancomycin Stopped (09/15/18 1526)  . vasopressin (PITRESSIN) infusion - *FOR SHOCK* Stopped (09/15/18 1829)   . sodium chloride   Intravenous Once  . aspirin EC  81 mg Oral Daily  . Chlorhexidine Gluconate Cloth  6 each Topical Q0600  . citalopram  20 mg Oral Daily  . fluticasone furoate-vilanterol  1 puff Inhalation Daily  . insulin aspart  0-15 Units Subcutaneous TID WC  . insulin aspart  0-5 Units Subcutaneous QHS  . insulin glargine  12 Units Subcutaneous QHS  . loratadine  10 mg Oral Daily  . rosuvastatin  20 mg Oral Daily   acetaminophen **OR** acetaminophen, albuterol, docusate sodium, HYDROcodone-acetaminophen, HYDROcodone-acetaminophen, ondansetron **OR** ondansetron (ZOFRAN) IV  Assessment/ Plan:  [name] is a 58 y.o. black male with hyeprtension, COPD, diabetes mellitus type II who underwent bilateral knee replacement surgeries last week. who was admitted to [facility] on 09/12/2018 for sepsis  1. Acute renal failure with metabolic acidosis: baseline creatinine of 1.07 with normal GFR in 06/12/18.  TMP/SMX could have been contributing to renal failure Nonoliguric urine output - Holding metformin, lisinopril and TMP/SMX  2. Septic shock/hypotension Requiring vasopressors. Weaned down to norepinephrine.  Appreciate ortho input.  Appreciate ID input - empiric cefepime and vanco  3. Hyponatremia: secondary to renal  failure and hypervolemic volume status.    LOS: 4 Chianna Spirito 5/30/20209:23 AM

## 2018-09-16 NOTE — Progress Notes (Signed)
CRITICAL CARE NOTE        SUBJECTIVE FINDINGS & SIGNIFICANT EVENTS   Patient remains critically ill Patient is off of vasopressor support and mental status has significantly improved.  -Had right knee synovial fluid aspiration today with studies in progress.  Discussed case with orthopedic surgery possibly for OR soon.  PAST MEDICAL HISTORY   Past Medical History:  Diagnosis Date   Allergy    Anxiety    Arthritis    knees   Asthma, mild intermittent, well-controlled    Chronic obstructive asthma (HCC)    COPD (chronic obstructive pulmonary disease) (HCC)    Depression with anxiety    Diabetes (HCC)    GERD (gastroesophageal reflux disease)    HBP (high blood pressure)    High cholesterol    Renal cyst, right    Sleep apnea    CPAP   Swelling      SURGICAL HISTORY   Past Surgical History:  Procedure Laterality Date   BLADDER REPAIR     COLONOSCOPY WITH PROPOFOL N/A 04/07/2015   Procedure: COLONOSCOPY WITH PROPOFOL;  Surgeon: Lucilla Lame, MD;  Location: [facility];  Service: Endoscopy;  Laterality: N/A;  Diabetic - oral meds CPAP   HAND SURGERY     4TH AND 5TH FINGER   KNEE ARTHROSCOPY Right    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   VEIN SURGERY Right      FAMILY HISTORY   Family History  Problem Relation Age of Onset   Hypertension Mother    Diabetes Mother    Diabetes Father    Hypertension Father    Cancer Father    Seizures Daughter    Lupus Daughter    Cancer Maternal Grandfather        Prostate   Diabetes Paternal Grandfather    Prostate cancer Maternal Uncle    Bladder Cancer Neg Hx    Kidney cancer Neg Hx      SOCIAL HISTORY   Social History   Tobacco Use   Smoking status: Former Smoker    Packs/day:  1.00    Years: 10.00    Pack years: 10.00    Types: Cigarettes    Start date: 04/20/1995    Last attempt to quit: 04/19/2005    Years since quitting: 13.4   Smokeless tobacco: Never Used  Substance Use Topics   Alcohol use: Yes    Alcohol/week: 10.0 standard drinks    Types: 10 Cans of beer per week    Comment: occasional   Drug use: No     MEDICATIONS   Current Medication:  Current Facility-Administered Medications:    0.9 %  sodium chloride infusion (Manually program via Guardrails IV Fluids), , Intravenous, Once, Ottie Glazier, MD, Stopped at 09/15/18 0936   acetaminophen (TYLENOL) tablet 650 mg, 650 mg, Oral, Q6H PRN, 650 mg at 09/14/18 1527 **OR** acetaminophen (TYLENOL) suppository 650 mg, 650 mg, Rectal, Q6H PRN, Gladstone Lighter, MD   albuterol (PROVENTIL) (2.5 MG/3ML) 0.083% nebulizer solution 2.5 mg, 2.5 mg, Nebulization, Q6H PRN, Ottie Glazier, MD   aspirin EC tablet 81 mg, 81 mg, Oral, Daily, Kalisetti, Radhika, MD, 81 mg at 09/15/18 0803   ceFEPIme (MAXIPIME) 2 g in sodium chloride 0.9 % 100 mL IVPB, 2 g, Intravenous, Q12H, Rocky Morel, RPH, Stopped at 09/15/18 2331   Chlorhexidine Gluconate Cloth 2 % PADS 6 each, 6 each, Topical, Q0600, Darel Hong D, NP, 6 each at 09/15/18 0630   citalopram (CELEXA) tablet 20  mg, 20 mg, Oral, Daily, Gladstone Lighter, MD, 20 mg at 09/15/18 0803   docusate sodium (COLACE) capsule 100 mg, 100 mg, Oral, BID PRN, Lanney Gins, Auden Tatar, MD   fluticasone furoate-vilanterol (BREO ELLIPTA) 100-25 MCG/INH 1 puff, 1 puff, Inhalation, Daily, Gladstone Lighter, MD, 1 puff at 09/14/18 1015   HYDROcodone-acetaminophen (NORCO/VICODIN) 5-325 MG per tablet 1-2 tablet, 1-2 tablet, Oral, Q4H PRN, Gladstone Lighter, MD, 1 tablet at 09/15/18 0947   HYDROcodone-acetaminophen (NORCO/VICODIN) 5-325 MG per tablet 2 tablet, 2 tablet, Oral, Q4H PRN, Ottie Glazier, MD, 2 tablet at 09/14/18 0300   insulin aspart (novoLOG) injection 0-15  Units, 0-15 Units, Subcutaneous, TID WC, Ottie Glazier, MD, 5 Units at 09/15/18 1700   insulin aspart (novoLOG) injection 0-5 Units, 0-5 Units, Subcutaneous, QHS, Gladstone Lighter, MD, 3 Units at 09/14/18 2029   insulin glargine (LANTUS) injection 12 Units, 12 Units, Subcutaneous, QHS, Ottie Glazier, MD, 12 Units at 09/15/18 2246   loratadine (CLARITIN) tablet 10 mg, 10 mg, Oral, Daily, Tressia Miners, Radhika, MD, 10 mg at 09/15/18 2878   norepinephrine (LEVOPHED) 16 mg in 258mL premix infusion, 0-40 mcg/min, Intravenous, Titrated, Awilda Bill, NP, Last Rate: 2.81 mL/hr at 09/16/18 0600, 3 mcg/min at 09/16/18 0600   ondansetron (ZOFRAN) tablet 4 mg, 4 mg, Oral, Q6H PRN **OR** ondansetron (ZOFRAN) injection 4 mg, 4 mg, Intravenous, Q6H PRN, Gladstone Lighter, MD   phenylephrine (NEO-SYNEPHRINE) 10 mg in sodium chloride 0.9 % 250 mL (0.04 mg/mL) infusion, 0-400 mcg/min, Intravenous, Titrated, Darel Hong D, NP, Stopped at 09/14/18 1305   rosuvastatin (CRESTOR) tablet 20 mg, 20 mg, Oral, Daily, Tressia Miners, Radhika, MD, 20 mg at 09/15/18 0803   vancomycin (VANCOCIN) IVPB 1000 mg/200 mL premix, 1,000 mg, Intravenous, Q24H, Gladstone Lighter, MD, Stopped at 09/15/18 1526   vasopressin (PITRESSIN) 40 Units in sodium chloride 0.9 % 250 mL (0.16 Units/mL) infusion, 0.03 Units/min, Intravenous, Continuous, Blakeney, Dreama Saa, NP, Stopped at 09/15/18 1829    ALLERGIES   Lipitor [atorvastatin]; Other; and Viagra  [sildenafil citrate]    REVIEW OF SYSTEMS    10 point ROS conducted and is negative except as per subjective findings  PHYSICAL EXAMINATION   Vitals:   09/16/18 0600 09/16/18 0700  BP: 108/74 105/69  Pulse: 77 (!) 117  Resp: (!) 26 (!) 23  Temp:    SpO2: (!) 85% 94%    GENERAL: Minimal distress due to acutely ill status HEAD: Normocephalic, atraumatic.  EYES: Pupils equal, round, reactive to light.  No scleral icterus.  MOUTH: Moist mucosal membrane. NECK:  Supple. No thyromegaly. No nodules. No JVD.  PULMONARY: Clear to auscultation bilaterally CARDIOVASCULAR: S1 and S2. Regular rate and rhythm. No murmurs, rubs, or gallops.  GASTROINTESTINAL: Soft, nontender, non-distended. No masses. Positive bowel sounds. No hepatosplenomegaly.  MUSCULOSKELETAL: No swelling, clubbing, or edema.  NEUROLOGIC: Mild distress due to acute illness SKIN:intact,warm,dry   LABS AND IMAGING     LAB RESULTS: Recent Labs  Lab 09/14/18 0336 09/15/18 0436 09/16/18 0459  NA 126* 127* 133*  K 3.8 3.6 3.5  CL 92* 90* 96*  CO2 24 22 23   BUN 29* 27* 20  CREATININE 3.50* 2.82* 2.20*  GLUCOSE 247* 213* 197*   Recent Labs  Lab 09/14/18 0336 09/15/18 0436  09/15/18 1832 09/15/18 2255 09/16/18 0459  HGB 8.0* 7.5*   < > 9.3* 9.4* 9.6*  HCT 24.5* 23.2*   < > 28.3* 28.4* 29.1*  WBC 5.9 5.5  --   --   --  6.8  PLT 144* 158  --   --   --  193   < > = values in this interval not displayed.     IMAGING RESULTS: Ct Knee Left Wo Contrast  Result Date: 09/15/2018 CLINICAL DATA:  Knee replacement, bilateral knee pain.  Sepsis. EXAM: CT OF THE left KNEE WITHOUT CONTRAST TECHNIQUE: Multidetector CT imaging of the left knee was performed according to the standard protocol. Multiplanar CT image reconstructions were also generated. COMPARISON:  Radiographs 09/12/2018 FINDINGS: Bones/Joint/Cartilage Total knee prosthesis in place. No significant abnormal lucency along the metal-methacrylate it or methacrylate-bone interfaces at this time. There may be a tiny amount of gas just above the anterior portion of the tibial tray on image 53/2 which would be unusual for 2 weeks out from surgery, but the patient has had recent arthrocentesis or drain in place than back because for gas in the joint. Moderate to large joint effusion with some speckled calcifications along the margins of the suprapatellar bursa for example as shown on image 12/3. Free osteochondral fragments are present  posteromedially in the joint along the margin of the polymer spacer. No complicating feature of the patellar component is identified. There is a moderate size Baker's cyst posteromedially. In addition there is a suggestion of a somewhat distended popliteus recess potentially companion containing at least 1 free osteochondral fragment. Ligaments Suboptimally assessed by CT. Muscles and Tendons There is appearance of at least partial disruption of the distal quadriceps tendon. Soft tissues Diffuse subcutaneous edema along the visualized portion of the distal thigh and proximal calf. IMPRESSION: 1. Moderate to large joint effusion, potentially with a tiny amount of loculated gas just above the anterior portion of the tibial tray best appreciated on the axial images. This would be unusual 2 weeks out from surgery, and could possibly indicate infection, but if the patient has had recent arthrocentesis or drain placement in the joint then sterile gas may have been introduced at that time. Arthrocentesis could be utilized for definitive assessment of infection of the joint fluid. 2. There is no abnormal lucency along the margins of the implant. 3. Several free osteochondral fragments posteromedial to the joint line and also in the popliteus recess. 4. Partial disruption of the distal quadriceps tendon. Depending on the type of surgery employed (traditional versus minimally invasive) , this can be a normal postoperative finding. 5. Subcutaneous edema along the distal thigh and proximal calf. Electronically Signed   By: Van Clines M.D.   On: 09/15/2018 16:36   Ct Knee Right Wo Contrast  Result Date: 09/15/2018 CLINICAL DATA:  Bilateral knee pain. Sepsis. Knee replacement 2 weeks ago. EXAM: CT OF THE right KNEE WITHOUT CONTRAST TECHNIQUE: Multidetector CT imaging of the right knee was performed according to the standard protocol. Multiplanar CT image reconstructions were also generated. COMPARISON:  None.  FINDINGS: Bones/Joint/Cartilage Right total knee prosthesis in place without abnormal lucency along the components the prosthesis to suggest loosening or bony findings of infection. No bony destructive findings are observed. No well-defined periprosthetic fracture. Expected alignment of components of the implant. Moderate-sized knee joint effusion with scattered calcifications along the margins of the suprapatellar bursa. No appreciable gas in the joint. Small well corticated ossicle along the tibial tubercle, likely from remote Osgood-Schlatter disease. Moderate-sized Baker's cyst. There is evidence of an old fibular shaft fracture which has healed. Small amount of chronic appearing heterotopic ossification along the posterior margin of the soleus muscle. Ligaments Suboptimally assessed by CT. Muscles and Tendons Small amount of heterotopic calcification along the posterior margin of the soleus muscle. There is  some irregularity along the medial distal quadriceps possibly from partial release, correlate with operative history. The defect in the quadriceps is smaller than on the contralateral side. Soft tissues Moderate subcutaneous edema in the distal thigh and proximal calf. Medial subcutaneous varicosities in the proximal calf. IMPRESSION: 1. Moderate-sized knee joint effusion extending into the suprapatellar bursa. Moderate size Baker's cyst. No appreciable gas in the joint or bony destructive findings. No abnormal lucency along the implant. 2. Irregularity in the medial distal quadriceps tendon potentially from partial release or a port into the joint, correlate with operative history. 3. Well corticated ossicle in the distal patellar tendon likely from remote Osgood-Schlatter disease. 4. Moderate subcutaneous edema in the region. Electronically Signed   By: Van Clines M.D.   On: 09/15/2018 16:45      ASSESSMENT AND PLAN     -Multidisciplinary rounds held today   Septic shock -Possibly  due to urinary tract infection with severe AKI stage IV -concern forpossiblePJI of bilateral knees as they are hot to touch, swollen/tender, patient with fevers, chills hypotension  - on Vancomycing and cefepime IV - will place ID consult - appreciate input - orthopedics on case- appreciate input -follow ABG and LA -follow up cultures Leukocytosis improved on antibiotics    Acute blood loss anemia -status post PRBC transfusion -No obvious source of grossly appreciable bleeding -3 BMs yesteday - will send for occult blood next BM -Monitor H&H - will consider DIC - fibrinogen is adequate - pathology slide consultation -peripheral blood film normal     Acute kidney injury stage IV -DC nonessential nephrotoxic meds -Nephrology consultation- GFR improved overnight -Status post IV fluid rehydration-net positive 8 L -Developing crackles at the bases- cxr pending   Diabetes mellitus with uncontrolled hyperglycemia -On insulin sliding scale AC at bedtime -Starting nightly long-acting Lantus 8 units subcu for now    ID -continue IV abx as prescibed -follow up cultures  GI/Nutrition GI PROPHYLAXIS as indicated DIET-->TF's as tolerated Constipation protocol as indicated  ENDO - ICU hypoglycemic\Hyperglycemia protocol -check FSBS per protocol   ELECTROLYTES -follow labs as needed -replace as needed -pharmacy consultation   DVT/GI PRX ordered -SCDs  TRANSFUSIONS AS NEEDED MONITOR FSBS ASSESS the need for LABS as needed   Critical care provider statement:  Critical care time (minutes):33 Critical care time was exclusive of: Separately billable procedures and treating other patients Critical care was necessary to treat or prevent imminent or life-threatening deterioration of the following conditions: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 I assumed direction of critical care for this patient from another provider in my specialty: no    Ottie Glazier, M.D.  Division of [facility]

## 2018-09-16 NOTE — Progress Notes (Signed)
ID Chart reviewed remotely and spoke to Dr.Kailsetti Still has low grade fever and 100.9 last night inspite of 5th day of Iv antibiotic and was also on 2 days of oral antibiotics prior to admission     Left knee aspirate  Around 9690 WBC ( 92% ) N and Rt knee 2581 ( 88% N) > 2000 wbc indicative of inflammatory or infective process. Because of ongoing fever and still tachycardic and needing pressor even today and with no other source recommend left knee washout.

## 2018-09-16 NOTE — Progress Notes (Signed)
[facility] [place] NAME: Isaac Cross    MR#:  [id number]  DATE OF BIRTH:  10-14-1960  SUBJECTIVE:  patient came in with dizziness and weakness. Continues to have fever. Found to have acute renal failure.  REVIEW OF SYSTEMS:   Review of Systems  Constitutional: Negative for chills, fever and weight loss.  HENT: Negative for ear discharge, ear pain and nosebleeds.   Eyes: Negative for blurred vision, pain and discharge.  Respiratory: Negative for sputum production, shortness of breath, wheezing and stridor.   Cardiovascular: Negative for chest pain, palpitations, orthopnea and PND.  Gastrointestinal: Positive for nausea. Negative for abdominal pain, diarrhea and vomiting.  Genitourinary: Negative for frequency and urgency.  Musculoskeletal: Positive for joint pain. Negative for back pain.  Neurological: Positive for weakness. Negative for sensory change, speech change and focal weakness.  Psychiatric/Behavioral: Negative for depression and hallucinations. The patient is not nervous/anxious.    Tolerating Diet:yes Tolerating PT: pending  DRUG ALLERGIES:   Allergies  Allergen Reactions  . Lipitor [Atorvastatin] Hives and Itching  . Other   . Viagra  [Sildenafil Citrate]     vision loss    VITALS:  Blood pressure 105/69, pulse (!) 117, temperature 99.2 F (37.3 C), temperature source Oral, resp. rate (!) 23, height 6\' 4"  (1.93 m), weight (!) 145.2 kg, SpO2 94 %.  PHYSICAL EXAMINATION:   Physical Exam  GENERAL:  58 y.o.-year-old patient lying in the bed with no acute distress.  EYES: Pupils equal, round, reactive to light and accommodation. No scleral icterus. Extraocular muscles intact.  HEENT: Head atraumatic, normocephalic. Oropharynx and nasopharynx clear.  NECK:  Supple, no jugular venous distention. No thyroid enlargement, no tenderness.  LUNGS: Normal breath sounds bilaterally, no wheezing, rales, rhonchi. No use of  accessory muscles of respiration.  CARDIOVASCULAR: S1, S2 normal. No murmurs, rubs, or gallops.  ABDOMEN: Soft, nontender, nondistended. Bowel sounds present. No organomegaly or mass.  EXTREMITIES: bilateral knee joint Staples present with swelling + Skin looks normal. No drainage from the incision site.  NEUROLOGIC: Cranial nerves II through XII are intact. No focal Motor or sensory deficits b/l.   PSYCHIATRIC:  patient is alert and oriented x 3.  SKIN: No obvious rash, lesion, or ulcer.   LABORATORY PANEL:  CBC Recent Labs  Lab 09/16/18 0459  WBC 6.8  HGB 9.6*  HCT 29.1*  PLT 193    Chemistries  Recent Labs  Lab 09/16/18 0459  NA 133*  K 3.5  CL 96*  CO2 23  GLUCOSE 197*  BUN 20  CREATININE 2.20*  CALCIUM 8.7*  AST 143*  ALT 59*  ALKPHOS 75  BILITOT 0.8   Cardiac Enzymes No results for input(s): TROPONINI in the last 168 hours. RADIOLOGY:  Ct Head Wo Contrast  Result Date: 09/14/2018 CLINICAL DATA:  Altered mental status. Hypertension. Symptoms began today. EXAM: CT HEAD WITHOUT CONTRAST TECHNIQUE: Contiguous axial images were obtained from the base of the skull through the vertex without intravenous contrast. COMPARISON:  None. FINDINGS: Brain: The brain shows a normal appearance without evidence of malformation, atrophy, old or acute small or large vessel infarction, mass lesion, hemorrhage, hydrocephalus or extra-axial collection. Vascular: No hyperdense vessel. No evidence of atherosclerotic calcification. Skull: Normal.  No traumatic finding.  No focal bone lesion. Sinuses/Orbits: Sinuses are clear. Orbits appear normal. Mastoids are clear. Other: None significant IMPRESSION: Normal head CT Electronically Signed   By: Nelson Chimes M.D.   On: 09/14/2018  15:21   Ct Knee Left Wo Contrast  Result Date: 09/15/2018 CLINICAL DATA:  Knee replacement, bilateral knee pain.  Sepsis. EXAM: CT OF THE left KNEE WITHOUT CONTRAST TECHNIQUE: Multidetector CT imaging of the left  knee was performed according to the standard protocol. Multiplanar CT image reconstructions were also generated. COMPARISON:  Radiographs 09/12/2018 FINDINGS: Bones/Joint/Cartilage Total knee prosthesis in place. No significant abnormal lucency along the metal-methacrylate it or methacrylate-bone interfaces at this time. There may be a tiny amount of gas just above the anterior portion of the tibial tray on image 53/2 which would be unusual for 2 weeks out from surgery, but the patient has had recent arthrocentesis or drain in place than back because for gas in the joint. Moderate to large joint effusion with some speckled calcifications along the margins of the suprapatellar bursa for example as shown on image 12/3. Free osteochondral fragments are present posteromedially in the joint along the margin of the polymer spacer. No complicating feature of the patellar component is identified. There is a moderate size Baker's cyst posteromedially. In addition there is a suggestion of a somewhat distended popliteus recess potentially companion containing at least 1 free osteochondral fragment. Ligaments Suboptimally assessed by CT. Muscles and Tendons There is appearance of at least partial disruption of the distal quadriceps tendon. Soft tissues Diffuse subcutaneous edema along the visualized portion of the distal thigh and proximal calf. IMPRESSION: 1. Moderate to large joint effusion, potentially with a tiny amount of loculated gas just above the anterior portion of the tibial tray best appreciated on the axial images. This would be unusual 2 weeks out from surgery, and could possibly indicate infection, but if the patient has had recent arthrocentesis or drain placement in the joint then sterile gas may have been introduced at that time. Arthrocentesis could be utilized for definitive assessment of infection of the joint fluid. 2. There is no abnormal lucency along the margins of the implant. 3. Several free  osteochondral fragments posteromedial to the joint line and also in the popliteus recess. 4. Partial disruption of the distal quadriceps tendon. Depending on the type of surgery employed (traditional versus minimally invasive) , this can be a normal postoperative finding. 5. Subcutaneous edema along the distal thigh and proximal calf. Electronically Signed   By: Van Clines M.D.   On: 09/15/2018 16:36   Ct Knee Right Wo Contrast  Result Date: 09/15/2018 CLINICAL DATA:  Bilateral knee pain. Sepsis. Knee replacement 2 weeks ago. EXAM: CT OF THE right KNEE WITHOUT CONTRAST TECHNIQUE: Multidetector CT imaging of the right knee was performed according to the standard protocol. Multiplanar CT image reconstructions were also generated. COMPARISON:  None. FINDINGS: Bones/Joint/Cartilage Right total knee prosthesis in place without abnormal lucency along the components the prosthesis to suggest loosening or bony findings of infection. No bony destructive findings are observed. No well-defined periprosthetic fracture. Expected alignment of components of the implant. Moderate-sized knee joint effusion with scattered calcifications along the margins of the suprapatellar bursa. No appreciable gas in the joint. Small well corticated ossicle along the tibial tubercle, likely from remote Osgood-Schlatter disease. Moderate-sized Baker's cyst. There is evidence of an old fibular shaft fracture which has healed. Small amount of chronic appearing heterotopic ossification along the posterior margin of the soleus muscle. Ligaments Suboptimally assessed by CT. Muscles and Tendons Small amount of heterotopic calcification along the posterior margin of the soleus muscle. There is some irregularity along the medial distal quadriceps possibly from partial release, correlate with operative history.  The defect in the quadriceps is smaller than on the contralateral side. Soft tissues Moderate subcutaneous edema in the distal thigh and  proximal calf. Medial subcutaneous varicosities in the proximal calf. IMPRESSION: 1. Moderate-sized knee joint effusion extending into the suprapatellar bursa. Moderate size Baker's cyst. No appreciable gas in the joint or bony destructive findings. No abnormal lucency along the implant. 2. Irregularity in the medial distal quadriceps tendon potentially from partial release or a port into the joint, correlate with operative history. 3. Well corticated ossicle in the distal patellar tendon likely from remote Osgood-Schlatter disease. 4. Moderate subcutaneous edema in the region. Electronically Signed   By: Van Clines M.D.   On: 09/15/2018 16:45   Dg Chest Port 1 View  Result Date: 09/14/2018 CLINICAL DATA:  Central line placement EXAM: PORTABLE CHEST 1 VIEW COMPARISON:  09/14/2018 FINDINGS: There is a newly placed right-sided central venous catheter with tip terminating over the SVC. There is no pneumothorax. The lung volumes are low. The heart size is enlarged. There are hazy opacities bilaterally with prominent interstitial lung markings. There is a small right-sided pleural effusion. There is likely a trace left-sided pleural effusion. IMPRESSION: 1. Right-sided central venous catheter as above with no pneumothorax. 2. Low lung volumes. 3. Borderline enlarged heart. 4. Prominent interstitial lung markings and a few hazy ground-glass airspace opacities bilaterally are nonspecific but can be seen in patients with pulmonary edema or an atypical infectious process. 5. Developing small right-sided pleural effusion. Electronically Signed   By: Constance Holster M.D.   On: 09/14/2018 13:47   Dg Chest Port 1 View  Result Date: 09/14/2018 CLINICAL DATA:  Dyspnea EXAM: PORTABLE CHEST 1 VIEW COMPARISON:  09/12/2018 chest radiograph. FINDINGS: Low lung volumes. Stable cardiomediastinal silhouette with normal heart size. No pneumothorax. No pleural effusion. No overt pulmonary edema. No acute consolidative  airspace disease. IMPRESSION: Low lung volumes, without active cardiopulmonary disease. Electronically Signed   By: Ilona Sorrel M.D.   On: 09/14/2018 10:56   ASSESSMENT AND PLAN:   Rion Catala  is a 58 y.o. male with a known history of arthritis, COPD not on home oxygen, hypertension, sleep apnea, non-insulin-dependent diabetes mellitus presents to hospital secondary to dizziness, weakness and a fall this morning.  1.Sepsis-source? Unclear likely knee joint -Bilateral knee staples in place with no signs of infection but there is swelling of knee joint - Ortho has been consult-- Dr. Harlow Mares noted --?joint aspiration -Blood cultures negative - urine cultures negative -PCR RSA negative -cont on vancomycin and cefepime--vanc d/ced -Procalcitonin is elevated.   -IV fluids-- with bicarb  2.  Acute renal failure-likely ATN from sepsis/ pt was taking Bactrim as out pt -creat 3.7--4.31--3.50-2.20 -was on IV bicarb gtt -  improvedl urine output.   -Renal ultrasound ordered. - Nephrology  Consult noted - Serological work-up has been ordered as well. -Hold lisinopril and metformin and other nephrotoxins.  3.  Bilateral knee replacement surgery-bilateral knee surgery done about 10 days ago.   -X-ray showing soft tissue swelling, patient had a fall earlier today onto his knees. -Orthopedics input noted--?joint aspiration  4.  Hypertension-hold lisinopril.  5.  Diabetes mellitus-we will start sliding scale insulin.  - Hold metformin given worsening renal failure.  6.  DVT prophylaxis- SCD   CODE STATUS: full  DVT Prophylaxis:SCD  TOTAL TIME TAKING CARE OF THIS PATIENT: 30* minutes.  >50% time spent on counselling and coordination of care  POSSIBLE D/C IN **?* DAYS, DEPENDING ON CLINICAL CONDITION.  Note: This  dictation was prepared with Dragon dictation along with smaller phrase technology. Any transcriptional errors that result from this process are unintentional.  Fritzi Mandes  M.D on 09/16/2018 at 10:03 AM  Between 7am to 6pm - Pager - [phone]  After 6pm go to [email or web address] - password [email or web address] [facility]  Office  [phone]  CC: Primary care physician; Steele Sizer, MDPatient ID: [name], male   DOB: [birth year], 58 y.o.   MRN: [id number]

## 2018-09-17 ENCOUNTER — Inpatient Hospital Stay: Payer: BC Managed Care – PPO | Admitting: Anesthesiology

## 2018-09-17 ENCOUNTER — Encounter
Admission: EM | Disposition: A | Discharge status: Home Health | Payer: Self-pay | Source: Home / Self Care | Attending: Internal Medicine

## 2018-09-17 HISTORY — PX: TOTAL KNEE ARTHROPLASTY: SHX125

## 2018-09-17 LAB — BPAM RBC
Blood Product Expiration Date: 202006232359
Blood Product Expiration Date: 202006252359
Blood Product Expiration Date: 202006272359
ISSUE DATE / TIME: 202005291352
ISSUE DATE / TIME: 202005291843
Unit Type and Rh: 1700
Unit Type and Rh: 7300
Unit Type and Rh: 7300

## 2018-09-17 LAB — BASIC METABOLIC PANEL
Anion gap: 15 (ref 5–15)
BUN: 19 mg/dL (ref 6–20)
CO2: 22 mmol/L (ref 22–32)
Calcium: 9.1 mg/dL (ref 8.9–10.3)
Chloride: 98 mmol/L (ref 98–111)
Creatinine, Ser: 2.46 mg/dL — ABNORMAL HIGH (ref 0.61–1.24)
GFR calc Af Amer: 32 mL/min — ABNORMAL LOW (ref >60)
GFR calc non Af Amer: 27 mL/min — ABNORMAL LOW (ref >60)
Glucose, Bld: 201 mg/dL — ABNORMAL HIGH (ref 70–99)
Potassium: 3.4 mmol/L — ABNORMAL LOW (ref 3.5–5.1)
Sodium: 135 mmol/L (ref 135–145)

## 2018-09-17 LAB — TYPE AND SCREEN
ABO/RH(D): B POS
Antibody Screen: NEGATIVE
Unit division: 0
Unit division: 0
Unit division: 0

## 2018-09-17 LAB — HAPTOGLOBIN: Haptoglobin: 563 mg/dL — ABNORMAL HIGH (ref 29–370)

## 2018-09-17 LAB — GLUCOSE, CAPILLARY
Glucose-Capillary: 192 mg/dL — ABNORMAL HIGH (ref 70–99)
Glucose-Capillary: 184 mg/dL — ABNORMAL HIGH (ref 70–99)
Glucose-Capillary: 187 mg/dL — ABNORMAL HIGH (ref 70–99)

## 2018-09-17 LAB — CBC
HCT: 29.3 % — ABNORMAL LOW (ref 39.0–52.0)
Hemoglobin: 9.5 g/dL — ABNORMAL LOW (ref 13.0–17.0)
MCH: 26.5 pg (ref 26.0–34.0)
MCHC: 32.4 g/dL (ref 30.0–36.0)
MCV: 81.8 fL (ref 80.0–100.0)
Platelets: 227 10*3/uL (ref 150–400)
RBC: 3.58 MIL/uL — ABNORMAL LOW (ref 4.22–5.81)
RDW: 14.6 % (ref 11.5–15.5)
WBC: 7.9 10*3/uL (ref 4.0–10.5)
nRBC: 0 % (ref 0.0–0.2)

## 2018-09-17 LAB — PHOSPHORUS: Phosphorus: 4.5 mg/dL (ref 2.5–4.6)

## 2018-09-17 LAB — CULTURE, BLOOD (ROUTINE X 2)
Culture: NO GROWTH
Culture: NO GROWTH
Special Requests: ADEQUATE
Special Requests: ADEQUATE

## 2018-09-17 LAB — PREPARE RBC (CROSSMATCH)

## 2018-09-17 LAB — MAGNESIUM: Magnesium: 1.5 mg/dL — ABNORMAL LOW (ref 1.7–2.4)

## 2018-09-17 SURGERY — ARTHROPLASTY, KNEE, TOTAL
Anesthesia: General | Laterality: Left

## 2018-09-17 MED ORDER — PHENOL 1.4 % MT LIQD
1.0000 | OROMUCOSAL | Status: DC | PRN
  Filled 2018-09-17: qty 177

## 2018-09-17 MED ORDER — ONDANSETRON HCL 4 MG/2ML IJ SOLN
INTRAMUSCULAR | Status: DC | PRN
  Administered 2018-09-17: 4 mg via INTRAVENOUS

## 2018-09-17 MED ORDER — BUPIVACAINE-EPINEPHRINE (PF) 0.5% -1:200000 IJ SOLN
INTRAMUSCULAR | Status: AC
  Filled 2018-09-17: qty 30

## 2018-09-17 MED ORDER — ACETAMINOPHEN 10 MG/ML IV SOLN
INTRAVENOUS | Status: AC
  Filled 2018-09-17: qty 100

## 2018-09-17 MED ORDER — METOCLOPRAMIDE HCL 5 MG PO TABS
5.0000 mg | ORAL_TABLET | Freq: Three times a day (TID) | ORAL | Status: DC | PRN
  Filled 2018-09-17: qty 2

## 2018-09-17 MED ORDER — ROCURONIUM BROMIDE 100 MG/10ML IV SOLN
INTRAVENOUS | Status: DC | PRN
  Administered 2018-09-17: 20 mg via INTRAVENOUS
  Administered 2018-09-17: 30 mg via INTRAVENOUS

## 2018-09-17 MED ORDER — ONDANSETRON HCL 4 MG/2ML IJ SOLN: 4.0000 mg | Freq: Four times a day (QID) | INTRAMUSCULAR | Status: DC | PRN

## 2018-09-17 MED ORDER — HYDROMORPHONE HCL 1 MG/ML IJ SOLN
INTRAMUSCULAR | Status: DC | PRN
  Administered 2018-09-17 (×5): .2 mg via INTRAVENOUS

## 2018-09-17 MED ORDER — SODIUM CHLORIDE FLUSH 0.9 % IV SOLN
INTRAVENOUS | Status: AC
  Filled 2018-09-17: qty 40

## 2018-09-17 MED ORDER — BACITRACIN 50000 UNITS IM SOLR
INTRAMUSCULAR | Status: AC
  Filled 2018-09-17: qty 2

## 2018-09-17 MED ORDER — ACETAMINOPHEN 10 MG/ML IV SOLN
INTRAVENOUS | Status: DC | PRN
  Administered 2018-09-17: 1000 mg via INTRAVENOUS

## 2018-09-17 MED ORDER — MAGNESIUM SULFATE 2 GM/50ML IV SOLN
2.0000 g | Freq: Once | INTRAVENOUS | Status: AC
  Administered 2018-09-17: 2 g via INTRAVENOUS
  Filled 2018-09-17: qty 50

## 2018-09-17 MED ORDER — HYDROMORPHONE HCL 1 MG/ML IJ SOLN
INTRAMUSCULAR | Status: AC
  Filled 2018-09-17: qty 1

## 2018-09-17 MED ORDER — FENTANYL CITRATE (PF) 100 MCG/2ML IJ SOLN
INTRAMUSCULAR | Status: DC | PRN
  Administered 2018-09-17: 50 ug via INTRAVENOUS
  Administered 2018-09-17: 100 ug via INTRAVENOUS
  Administered 2018-09-17: 50 ug via INTRAVENOUS

## 2018-09-17 MED ORDER — FENTANYL CITRATE (PF) 100 MCG/2ML IJ SOLN
INTRAMUSCULAR | Status: AC
  Filled 2018-09-17: qty 2

## 2018-09-17 MED ORDER — KETAMINE HCL 50 MG/ML IJ SOLN
INTRAMUSCULAR | Status: AC
  Filled 2018-09-17: qty 10

## 2018-09-17 MED ORDER — PHENYLEPHRINE HCL (PRESSORS) 10 MG/ML IV SOLN
INTRAVENOUS | Status: DC | PRN
  Administered 2018-09-17 (×6): 100 ug via INTRAVENOUS

## 2018-09-17 MED ORDER — PROPOFOL 10 MG/ML IV BOLUS
INTRAVENOUS | Status: DC | PRN
  Administered 2018-09-17: 150 mg via INTRAVENOUS

## 2018-09-17 MED ORDER — SODIUM CHLORIDE 0.9 % IR SOLN
Status: DC | PRN
  Administered 2018-09-17: 6000 mL

## 2018-09-17 MED ORDER — TRANEXAMIC ACID 1000 MG/10ML IV SOLN
INTRAVENOUS | Status: AC
  Filled 2018-09-17: qty 10

## 2018-09-17 MED ORDER — KETAMINE HCL 10 MG/ML IJ SOLN
INTRAMUSCULAR | Status: DC | PRN
  Administered 2018-09-17: 50 mg via INTRAVENOUS
  Administered 2018-09-17: 20 mg via INTRAVENOUS

## 2018-09-17 MED ORDER — LIDOCAINE HCL (CARDIAC) PF 100 MG/5ML IV SOSY
PREFILLED_SYRINGE | INTRAVENOUS | Status: DC | PRN
  Administered 2018-09-17: 100 mg via INTRAVENOUS

## 2018-09-17 MED ORDER — ONDANSETRON HCL 4 MG PO TABS: 4.0000 mg | ORAL_TABLET | Freq: Four times a day (QID) | ORAL | Status: DC | PRN

## 2018-09-17 MED ORDER — BUPIVACAINE LIPOSOME 1.3 % IJ SUSP
INTRAMUSCULAR | Status: AC
  Filled 2018-09-17: qty 20

## 2018-09-17 MED ORDER — MENTHOL 3 MG MT LOZG
1.0000 | LOZENGE | OROMUCOSAL | Status: DC | PRN
  Filled 2018-09-17: qty 9

## 2018-09-17 MED ORDER — BACITRACIN 50000 UNITS IM SOLR
INTRAMUSCULAR | Status: AC
  Filled 2018-09-17: qty 3

## 2018-09-17 MED ORDER — SODIUM CHLORIDE 0.9 % IV SOLN
INTRAVENOUS | Status: DC | PRN
  Administered 2018-09-17: 50 ug/min via INTRAVENOUS
  Administered 2018-09-17: 11:00:00 30 ug/min via INTRAVENOUS

## 2018-09-17 MED ORDER — DEXMEDETOMIDINE HCL 200 MCG/2ML IV SOLN
INTRAVENOUS | Status: DC | PRN
  Administered 2018-09-17: 8 ug via INTRAVENOUS
  Administered 2018-09-17 (×2): 4 ug via INTRAVENOUS

## 2018-09-17 MED ORDER — PROPOFOL 500 MG/50ML IV EMUL
INTRAVENOUS | Status: AC
  Filled 2018-09-17: qty 50

## 2018-09-17 MED ORDER — MAGNESIUM SULFATE 50 % IJ SOLN
INTRAMUSCULAR | Status: DC | PRN
  Administered 2018-09-17: 2 g via INTRAVENOUS

## 2018-09-17 MED ORDER — DOCUSATE SODIUM 100 MG PO CAPS
100.0000 mg | ORAL_CAPSULE | Freq: Two times a day (BID) | ORAL | Status: DC
  Administered 2018-09-17 – 2018-09-19 (×4): 100 mg via ORAL
  Filled 2018-09-17 (×6): qty 1

## 2018-09-17 MED ORDER — SUCCINYLCHOLINE CHLORIDE 20 MG/ML IJ SOLN
INTRAMUSCULAR | Status: DC | PRN
  Administered 2018-09-17: 160 mg via INTRAVENOUS

## 2018-09-17 MED ORDER — NOREPINEPHRINE BITARTRATE 1 MG/ML IV SOLN
INTRAVENOUS | Status: DC | PRN
  Administered 2018-09-17: 12:00:00 4 ug/min via INTRAVENOUS

## 2018-09-17 MED ORDER — SODIUM CHLORIDE 0.9 % IV SOLN
INTRAVENOUS | Status: DC | PRN
  Administered 2018-09-17: 11:00:00 via INTRAVENOUS

## 2018-09-17 MED ORDER — TRANEXAMIC ACID-NACL 1000-0.7 MG/100ML-% IV SOLN
INTRAVENOUS | Status: AC | PRN
  Administered 2018-09-17: 1000 mg via INTRAVENOUS

## 2018-09-17 MED ORDER — METOCLOPRAMIDE HCL 5 MG/ML IJ SOLN: 5.0000 mg | Freq: Three times a day (TID) | INTRAMUSCULAR | Status: DC | PRN

## 2018-09-17 MED ORDER — SUGAMMADEX SODIUM 200 MG/2ML IV SOLN
INTRAVENOUS | Status: DC | PRN
  Administered 2018-09-17: 300 mg via INTRAVENOUS

## 2018-09-17 MED ORDER — VASOPRESSIN 20 UNIT/ML IV SOLN
INTRAVENOUS | Status: DC | PRN
  Administered 2018-09-17: 2 [IU] via INTRAVENOUS
  Administered 2018-09-17 (×5): 1 [IU] via INTRAVENOUS

## 2018-09-17 MED ORDER — DEXMEDETOMIDINE HCL IN NACL 80 MCG/20ML IV SOLN
INTRAVENOUS | Status: AC
  Filled 2018-09-17: qty 20

## 2018-09-17 SURGICAL SUPPLY — 61 items
BLADE SAW 18WX90L 1.27 THK (BLADE) ×3 IMPLANT
BLADE SAW SAG 25X90X1.19 (BLADE) ×3 IMPLANT
BNDG COHESIVE 6X5 TAN STRL LF (GAUZE/BANDAGES/DRESSINGS) ×12 IMPLANT
BOWL CEMENT MIX W/ADAPTER (MISCELLANEOUS) ×3 IMPLANT
BRUSH SCRUB EZ  4% CHG (MISCELLANEOUS) ×4
BRUSH SCRUB EZ 4% CHG (MISCELLANEOUS) ×2 IMPLANT
CANISTER SUCT 1200ML W/VALVE (MISCELLANEOUS) IMPLANT
CANISTER SUCT 3000ML PPV (MISCELLANEOUS) ×3 IMPLANT
CHLORAPREP W/TINT 26 (MISCELLANEOUS) ×6 IMPLANT
COOLER POLAR GLACIER W/PUMP (MISCELLANEOUS) ×3 IMPLANT
COVER WAND RF STERILE (DRAPES) ×3 IMPLANT
CUFF TOURN SGL QUICK 30 (TOURNIQUET CUFF)
CUFF TOURN SGL QUICK 42 (TOURNIQUET CUFF) ×3 IMPLANT
CUFF TRNQT CYL 30X4X21-28X (TOURNIQUET CUFF) IMPLANT
DRAPE INCISE IOBAN 66X60 STRL (DRAPES) ×3 IMPLANT
DRAPE SHEET LG 3/4 BI-LAMINATE (DRAPES) ×6 IMPLANT
ELECT REM PT RETURN 9FT ADLT (ELECTROSURGICAL) ×3
ELECTRODE REM PT RTRN 9FT ADLT (ELECTROSURGICAL) ×1 IMPLANT
GAUZE SPONGE 4X4 12PLY STRL (GAUZE/BANDAGES/DRESSINGS) ×3 IMPLANT
GAUZE XEROFORM 1X8 LF (GAUZE/BANDAGES/DRESSINGS) ×3 IMPLANT
GLOVE BIO SURGEON STRL SZ8 (GLOVE) ×3 IMPLANT
GLOVE BIOGEL PI IND STRL 8.5 (GLOVE) ×1 IMPLANT
GLOVE BIOGEL PI INDICATOR 8.5 (GLOVE) ×2
GLOVE INDICATOR 8.0 STRL GRN (GLOVE) ×12 IMPLANT
GLOVE SURG ORTHO 8.0 STRL STRW (GLOVE) ×9 IMPLANT
GOWN STRL REUS W/ TWL LRG LVL3 (GOWN DISPOSABLE) ×1 IMPLANT
GOWN STRL REUS W/ TWL XL LVL3 (GOWN DISPOSABLE) ×1 IMPLANT
GOWN STRL REUS W/TWL LRG LVL3 (GOWN DISPOSABLE) ×2
GOWN STRL REUS W/TWL XL LVL3 (GOWN DISPOSABLE) ×2
HANDLE YANKAUER SUCT BULB TIP (MISCELLANEOUS) ×3 IMPLANT
HOLDER KNEE ALVARADO LINER (MISCELLANEOUS) ×3 IMPLANT
HOOD PEEL AWAY FLYTE STAYCOOL (MISCELLANEOUS) ×3 IMPLANT
INSERT ARTI HI FLEX 11 SZ 7-8 (Insert) ×3 IMPLANT
IV NS 1000ML (IV SOLUTION)
IV NS 1000ML BAXH (IV SOLUTION) IMPLANT
KIT TURNOVER KIT A (KITS) ×3 IMPLANT
MAT ABSORB  FLUID 56X50 GRAY (MISCELLANEOUS) ×2
MAT ABSORB FLUID 56X50 GRAY (MISCELLANEOUS) ×1 IMPLANT
NDL SAFETY ECLIPSE 18X1.5 (NEEDLE) ×1 IMPLANT
NEEDLE HYPO 18GX1.5 SHARP (NEEDLE) ×2
NEEDLE SPNL 20GX3.5 QUINCKE YW (NEEDLE) ×3 IMPLANT
NEPTUNE MANIFOLD (MISCELLANEOUS) ×3 IMPLANT
NS IRRIG 1000ML POUR BTL (IV SOLUTION) ×3 IMPLANT
PACK TOTAL KNEE (MISCELLANEOUS) ×3 IMPLANT
PAD DE MAYO PRESSURE PROTECT (MISCELLANEOUS) ×3 IMPLANT
PAD WRAPON POLAR KNEE (MISCELLANEOUS) ×1 IMPLANT
PULSAVAC PLUS IRRIG FAN TIP (DISPOSABLE) ×3
SOL .9 NS 3000ML IRR  AL (IV SOLUTION) ×4
SOL .9 NS 3000ML IRR UROMATIC (IV SOLUTION) ×2 IMPLANT
STAPLER SKIN PROX 35W (STAPLE) ×3 IMPLANT
SUCTION FRAZIER HANDLE 10FR (MISCELLANEOUS) ×2
SUCTION TUBE FRAZIER 10FR DISP (MISCELLANEOUS) ×1 IMPLANT
SUT DVC 2 QUILL PDO  T11 36X36 (SUTURE)
SUT DVC 2 QUILL PDO T11 36X36 (SUTURE) IMPLANT
SUT VIC AB 2-0 CT1 18 (SUTURE) ×3 IMPLANT
SUT VIC AB 2-0 CT1 27 (SUTURE)
SUT VIC AB 2-0 CT1 TAPERPNT 27 (SUTURE) IMPLANT
SUT VIC AB PLUS 45CM 1-MO-4 (SUTURE) ×3 IMPLANT
SYR 30ML LL (SYRINGE) ×9 IMPLANT
TIP FAN IRRIG PULSAVAC PLUS (DISPOSABLE) ×1 IMPLANT
WRAPON POLAR PAD KNEE (MISCELLANEOUS) ×3

## 2018-09-17 NOTE — Anesthesia Procedure Notes (Signed)
Procedure Name: Intubation Date/Time: 09/17/2018 12:00 PM Performed by: Allean Found, CRNA Pre-anesthesia Checklist: Patient identified, Patient being monitored, Timeout performed, Emergency Drugs available and Suction available Patient Re-evaluated:Patient Re-evaluated prior to induction Oxygen Delivery Method: Circle system utilized Preoxygenation: Pre-oxygenation with 100% oxygen Induction Type: IV induction Ventilation: Mask ventilation without difficulty Laryngoscope Size: McGraph and 4 Grade View: Grade I Tube type: Oral Tube size: 7.5 mm Number of attempts: 1 Airway Equipment and Method: Stylet Placement Confirmation: ETT inserted through vocal cords under direct vision,  positive ETCO2 and breath sounds checked- equal and bilateral Secured at: 23 cm Tube secured with: Tape Dental Injury: Teeth and Oropharynx as per pre-operative assessment

## 2018-09-17 NOTE — Progress Notes (Signed)
[facility] [place] NAME: Isaac Cross    MR#:  [id number]  DATE OF BIRTH:  1960/05/19  SUBJECTIVE:  CHIEF COMPLAINT:   Chief Complaint  Patient presents with   Dizziness   -Continues to spike fevers.  Status post left knee washout today -Still groggy from the anesthesia  REVIEW OF SYSTEMS:  Review of Systems  Constitutional: Positive for fever and malaise/fatigue. Negative for chills.  HENT: Negative for ear discharge, hearing loss, nosebleeds and sinus pain.   Eyes: Negative for blurred vision and double vision.  Respiratory: Negative for cough, shortness of breath and wheezing.   Cardiovascular: Negative for chest pain and palpitations.  Gastrointestinal: Negative for abdominal pain, constipation, diarrhea, nausea and vomiting.  Genitourinary: Negative for dysuria.  Musculoskeletal: Positive for joint pain and myalgias.  Neurological: Negative for dizziness, focal weakness, seizures, weakness and headaches.  Psychiatric/Behavioral: Negative for depression.    DRUG ALLERGIES:   Allergies  Allergen Reactions   Lipitor [Atorvastatin] Hives and Itching   Other    Viagra  [Sildenafil Citrate]     vision loss    VITALS:  Blood pressure 101/80, pulse (!) 115, temperature 98.9 F (37.2 C), temperature source Oral, resp. rate (!) 25, height 6\' 4"  (1.93 m), weight (!) 145.2 kg, SpO2 96 %.  PHYSICAL EXAMINATION:  Physical Exam   GENERAL:  58 y.o.-year-old patient lying in the bed with no acute distress.  EYES: Pupils equal, round, reactive to light and accommodation. No scleral icterus. Extraocular muscles intact.  HEENT: Head atraumatic, normocephalic. Oropharynx and nasopharynx clear.  NECK:  Supple, no jugular venous distention. No thyroid enlargement, no tenderness.  LUNGS: Normal breath sounds bilaterally, no wheezing, rales,rhonchi or crepitation. No use of accessory muscles of respiration.  Decreased bibasilar breath  sounds CARDIOVASCULAR: S1, S2 normal. No murmurs, rubs, or gallops.  ABDOMEN: Soft, nontender, nondistended. Bowel sounds present. No organomegaly or mass.  EXTREMITIES: Swelling of both knees noted with recent surgery, Steri-Strips in place.  Left knee with the ice packing today.  No pedal edema, cyanosis, or clubbing.  NEUROLOGIC: Cranial nerves II through XII are intact. Muscle strength 5/5 in all extremities. Sensation intact. Gait not checked.  Global weakness noted PSYCHIATRIC: The patient is sleepy, easily arousable.  Intermittent confusion noted. SKIN: No obvious rash, lesion, or ulcer.    LABORATORY PANEL:   CBC Recent Labs  Lab 09/17/18 0543  WBC 7.9  HGB 9.5*  HCT 29.3*  PLT 227   ------------------------------------------------------------------------------------------------------------------  Chemistries  Recent Labs  Lab 09/16/18 0459 09/17/18 0543  NA 133* 135  K 3.5 3.4*  CL 96* 98  CO2 23 22  GLUCOSE 197* 201*  BUN 20 19  CREATININE 2.20* 2.46*  CALCIUM 8.7* 9.1  MG  --  1.5*  AST 143*  --   ALT 59*  --   ALKPHOS 75  --   BILITOT 0.8  --    ------------------------------------------------------------------------------------------------------------------  Cardiac Enzymes No results for input(s): TROPONINI in the last 168 hours. ------------------------------------------------------------------------------------------------------------------  RADIOLOGY:  Ct Knee Left Wo Contrast  Result Date: 09/15/2018 CLINICAL DATA:  Knee replacement, bilateral knee pain.  Sepsis. EXAM: CT OF THE left KNEE WITHOUT CONTRAST TECHNIQUE: Multidetector CT imaging of the left knee was performed according to the standard protocol. Multiplanar CT image reconstructions were also generated. COMPARISON:  Radiographs 09/12/2018 FINDINGS: Bones/Joint/Cartilage Total knee prosthesis in place. No significant abnormal lucency along the metal-methacrylate it or methacrylate-bone  interfaces at this time.  There may be a tiny amount of gas just above the anterior portion of the tibial tray on image 53/2 which would be unusual for 2 weeks out from surgery, but the patient has had recent arthrocentesis or drain in place than back because for gas in the joint. Moderate to large joint effusion with some speckled calcifications along the margins of the suprapatellar bursa for example as shown on image 12/3. Free osteochondral fragments are present posteromedially in the joint along the margin of the polymer spacer. No complicating feature of the patellar component is identified. There is a moderate size Baker's cyst posteromedially. In addition there is a suggestion of a somewhat distended popliteus recess potentially companion containing at least 1 free osteochondral fragment. Ligaments Suboptimally assessed by CT. Muscles and Tendons There is appearance of at least partial disruption of the distal quadriceps tendon. Soft tissues Diffuse subcutaneous edema along the visualized portion of the distal thigh and proximal calf. IMPRESSION: 1. Moderate to large joint effusion, potentially with a tiny amount of loculated gas just above the anterior portion of the tibial tray best appreciated on the axial images. This would be unusual 2 weeks out from surgery, and could possibly indicate infection, but if the patient has had recent arthrocentesis or drain placement in the joint then sterile gas may have been introduced at that time. Arthrocentesis could be utilized for definitive assessment of infection of the joint fluid. 2. There is no abnormal lucency along the margins of the implant. 3. Several free osteochondral fragments posteromedial to the joint line and also in the popliteus recess. 4. Partial disruption of the distal quadriceps tendon. Depending on the type of surgery employed (traditional versus minimally invasive) , this can be a normal postoperative finding. 5. Subcutaneous edema along the  distal thigh and proximal calf. Electronically Signed   By: Van Clines M.D.   On: 09/15/2018 16:36   Ct Knee Right Wo Contrast  Result Date: 09/15/2018 CLINICAL DATA:  Bilateral knee pain. Sepsis. Knee replacement 2 weeks ago. EXAM: CT OF THE right KNEE WITHOUT CONTRAST TECHNIQUE: Multidetector CT imaging of the right knee was performed according to the standard protocol. Multiplanar CT image reconstructions were also generated. COMPARISON:  None. FINDINGS: Bones/Joint/Cartilage Right total knee prosthesis in place without abnormal lucency along the components the prosthesis to suggest loosening or bony findings of infection. No bony destructive findings are observed. No well-defined periprosthetic fracture. Expected alignment of components of the implant. Moderate-sized knee joint effusion with scattered calcifications along the margins of the suprapatellar bursa. No appreciable gas in the joint. Small well corticated ossicle along the tibial tubercle, likely from remote Osgood-Schlatter disease. Moderate-sized Baker's cyst. There is evidence of an old fibular shaft fracture which has healed. Small amount of chronic appearing heterotopic ossification along the posterior margin of the soleus muscle. Ligaments Suboptimally assessed by CT. Muscles and Tendons Small amount of heterotopic calcification along the posterior margin of the soleus muscle. There is some irregularity along the medial distal quadriceps possibly from partial release, correlate with operative history. The defect in the quadriceps is smaller than on the contralateral side. Soft tissues Moderate subcutaneous edema in the distal thigh and proximal calf. Medial subcutaneous varicosities in the proximal calf. IMPRESSION: 1. Moderate-sized knee joint effusion extending into the suprapatellar bursa. Moderate size Baker's cyst. No appreciable gas in the joint or bony destructive findings. No abnormal lucency along the implant. 2. Irregularity  in the medial distal quadriceps tendon potentially from partial release or a port  into the joint, correlate with operative history. 3. Well corticated ossicle in the distal patellar tendon likely from remote Osgood-Schlatter disease. 4. Moderate subcutaneous edema in the region. Electronically Signed   By: Van Clines M.D.   On: 09/15/2018 16:45    EKG:   Orders placed or performed during the hospital encounter of 09/12/18   EKG 12-Lead   EKG 12-Lead    ASSESSMENT AND PLAN:    EdwardGradyis a58 y.o.malewith a known history of arthritis, COPD not on home oxygen, hypertension, sleep apnea, non-insulin-dependent diabetes mellitus presents to hospital secondary to dizziness, weakness and a fall  1.Sepsis-with ongoing fevers.  Concern for left knee septic joint- given recent bilateral knee replacement surgery. -CT of both knees showing moderate to large effusions.  Status post left knee arthrocentesis and fluid showing WBCs about 10,000.  Right knee has been tapped with WBC of only 2000.  Gram stain is negative so far. -Appreciate Ortho and ID consults. -Blood cultures negative - urine cultures negative -Continue antibiotics with vancomycin and cefepime at this time -That is post left knee washout today  2. Acute renal failure-likely ATN from sepsis and recent use of Bactrim -Creatinine is slowly improving.  Urine output has improved . -Renal ultrasound with no significant obstruction.  Appreciate nephrology consult. -Hold lisinopril and metformin and other nephrotoxins.  3.  Acute encephalopathy-secondary to metabolic causes, sepsis.  Monitor closely.  No focal deficits  4. Hypertension-hold lisinopril.  5. Diabetes mellitus- sliding scale insulin.  -Hold metformin given worsening renal failure.  6. DVT prophylaxis- SCD  Physical therapy once more stable   All the records are reviewed and case discussed with Care Management/Social Workerr. Management plans  discussed with the patient, family and they are in agreement.  CODE STATUS: Full code  TOTAL TIME TAKING CARE OF THIS PATIENT: 41 minutes.   POSSIBLE D/C IN 3-4 DAYS, DEPENDING ON CLINICAL CONDITION.   Gladstone Lighter M.D on 09/17/2018 at 2:45 PM  Between 7am to 6pm - Pager - [phone]  After 6pm go to [email or web address] - password [email or web address] [facility]  Office  [phone]  CC: Primary care physician; Steele Sizer, MD

## 2018-09-17 NOTE — Anesthesia Preprocedure Evaluation (Addendum)
Anesthesia Evaluation  Patient identified by MRN, date of birth, ID band Patient awake    Reviewed: Allergy & Precautions, H&P , NPO status , Patient's Chart, lab work & pertinent test results  Airway Mallampati: III  TM Distance: >3 FB     Dental   Pulmonary asthma , sleep apnea and Continuous Positive Airway Pressure Ventilation , COPD, former smoker,           Cardiovascular hypertension, + CAD       Neuro/Psych PSYCHIATRIC DISORDERS Anxiety Depression negative neurological ROS     GI/Hepatic Neg liver ROS, GERD  ,  Endo/Other  diabetes  Renal/GU ARFRenal disease     Musculoskeletal   Abdominal   Peds  Hematology negative hematology ROS (+)   Anesthesia Other Findings Oriented to person and place, thinks it is April R IJ TL CVC R femoral art line 20G PIV L hand  Past Medical History: No date: Allergy No date: Anxiety No date: Arthritis     Comment:  knees No date: Asthma, mild intermittent, well-controlled No date: Chronic obstructive asthma (HCC) No date: COPD (chronic obstructive pulmonary disease) (HCC) No date: Depression with anxiety No date: Diabetes (HCC) No date: GERD (gastroesophageal reflux disease) No date: HBP (high blood pressure) No date: High cholesterol No date: Renal cyst, right No date: Sleep apnea     Comment:  CPAP No date: Swelling  Past Surgical History: No date: BLADDER REPAIR 04/07/2015: COLONOSCOPY WITH PROPOFOL; N/A     Comment:  Procedure: COLONOSCOPY WITH PROPOFOL;  Surgeon: Lucilla Lame, MD;  Location: [facility];  Service:               Endoscopy;  Laterality: N/A;  Diabetic - oral meds CPAP No date: HAND SURGERY     Comment:  4TH AND 5TH FINGER No date: KNEE ARTHROSCOPY; Right 09/01/2018: REPLACEMENT TOTAL KNEE BILATERAL; Bilateral No date: VEIN SURGERY; Right  BMI    Body Mass Index:  38.95 kg/m      Reproductive/Obstetrics negative  OB ROS                            Anesthesia Physical Anesthesia Plan  ASA: III  Anesthesia Plan: General ETT   Post-op Pain Management:    Induction:   PONV Risk Score and Plan: Ondansetron, Dexamethasone, Midazolam and Treatment may vary due to age or medical condition  Airway Management Planned:   Additional Equipment:   Intra-op Plan:   Post-operative Plan:   Informed Consent: I have reviewed the patients History and Physical, chart, labs and discussed the procedure including the risks, benefits and alternatives for the proposed anesthesia with the patient or authorized representative who has indicated his/her understanding and acceptance.     Dental Advisory Given  Plan Discussed with: Anesthesiologist and CRNA  Anesthesia Plan Comments:         Anesthesia Quick Evaluation

## 2018-09-17 NOTE — Anesthesia Post-op Follow-up Note (Signed)
Anesthesia QCDR form completed.        

## 2018-09-17 NOTE — Progress Notes (Signed)
CRITICAL CARE NOTE       SUBJECTIVE FINDINGS & SIGNIFICANT EVENTS    S/p L knee washout for septic arthritis. Patient remains critically ill Prognosis is guarded   PAST MEDICAL HISTORY   Past Medical History:  Diagnosis Date  . Allergy   . Anxiety   . Arthritis    knees  . Asthma, mild intermittent, well-controlled   . Chronic obstructive asthma ([place])   . COPD (chronic obstructive pulmonary disease) ([place])   . Depression with anxiety   . Diabetes ([place])   . GERD (gastroesophageal reflux disease)   . HBP (high blood pressure)   . High cholesterol   . Renal cyst, right   . Sleep apnea    CPAP  . Swelling      SURGICAL HISTORY   Past Surgical History:  Procedure Laterality Date  . BLADDER REPAIR    . COLONOSCOPY WITH PROPOFOL N/A 04/07/2015   Procedure: COLONOSCOPY WITH PROPOFOL;  Surgeon: Lucilla Lame, MD;  Location: [facility];  Service: Endoscopy;  Laterality: N/A;  Diabetic - oral meds CPAP  . HAND SURGERY     4TH AND 5TH FINGER  . KNEE ARTHROSCOPY Right   . 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 VEIN SURGERY Right      FAMILY HISTORY   Family History  Problem Relation Age of Onset  . Hypertension Mother   . Diabetes Mother   . Diabetes Father   . Hypertension Father   . Cancer Father   . Seizures Daughter   . Lupus Daughter   . Cancer Maternal Grandfather        Prostate  . Diabetes Paternal Grandfather   . Prostate cancer Maternal Uncle   . Bladder Cancer Neg Hx   . Kidney cancer Neg Hx      SOCIAL HISTORY   Social History   Tobacco Use  . Smoking status: Former Smoker    Packs/day: 1.00    Years: 10.00    Pack years: 10.00    Types: Cigarettes    Start date: 04/20/1995    Last attempt to quit: 04/19/2005    Years since quitting: 13.4   . Smokeless tobacco: Never Used  Substance Use Topics  . Alcohol use: Yes    Alcohol/week: 10.0 standard drinks    Types: 10 Cans of beer per week    Comment: occasional  . Drug use: No     MEDICATIONS   Current Medication:  Current Facility-Administered Medications:  .  0.9 %  sodium chloride infusion (Manually program via Guardrails IV Fluids), , Intravenous, Once, Ottie Glazier, MD, Stopped at 09/15/18 [phone] .  acetaminophen (TYLENOL) tablet 650 mg, 650 mg, Oral, Q6H PRN, 650 mg at 09/14/18 1527 **OR** acetaminophen (TYLENOL) suppository 650 mg, 650 mg, Rectal, Q6H PRN, Gladstone Lighter, MD, 650 mg at 09/17/18 0321 .  albuterol (PROVENTIL) (2.5 MG/3ML) 0.083% nebulizer solution 2.5 mg, 2.5 mg, Nebulization, Q6H PRN, Lanney Gins, Richard Holz, MD .  aspirin EC tablet 81 mg, 81 mg, Oral, Daily, Gladstone Lighter, MD, 81 mg at 09/16/18 0945 .  ceFEPIme (MAXIPIME) 2 g in sodium chloride 0.9 % 100 mL IVPB, 2 g, Intravenous, Q12H, Rocky Morel, RPH, Stopped at 09/16/18 2224 .  Chlorhexidine Gluconate Cloth 2 % PADS 6 each, 6 each, Topical, Q0600, Bradly Bienenstock, NP, 6 each at 09/17/18 0630 .  citalopram (CELEXA) tablet 20 mg, 20 mg, Oral, Daily, Tressia Miners, Radhika, MD, 20 mg at 09/16/18 0945 .  docusate sodium (COLACE)  capsule 100 mg, 100 mg, Oral, BID PRN, Lanney Gins, Hymen Arnett, MD .  fluticasone furoate-vilanterol (BREO ELLIPTA) 100-25 MCG/INH 1 puff, 1 puff, Inhalation, Daily, Gladstone Lighter, MD, 1 puff at 09/16/18 0944 .  HYDROcodone-acetaminophen (NORCO/VICODIN) 5-325 MG per tablet 1-2 tablet, 1-2 tablet, Oral, Q4H PRN, Gladstone Lighter, MD, 1 tablet at 09/15/18 0947 .  HYDROcodone-acetaminophen (NORCO/VICODIN) 5-325 MG per tablet 2 tablet, 2 tablet, Oral, Q4H PRN, Ottie Glazier, MD, 2 tablet at 09/14/18 0300 .  insulin aspart (novoLOG) injection 0-15 Units, 0-15 Units, Subcutaneous, TID WC, Ottie Glazier, MD, 3 Units at 09/16/18 1630 .  insulin aspart (novoLOG) injection 0-5 Units,  0-5 Units, Subcutaneous, QHS, Gladstone Lighter, MD, 2 Units at 09/16/18 2147 .  insulin glargine (LANTUS) injection 12 Units, 12 Units, Subcutaneous, QHS, Ottie Glazier, MD, 12 Units at 09/16/18 2147 .  loratadine (CLARITIN) tablet 10 mg, 10 mg, Oral, Daily, Gladstone Lighter, MD, 10 mg at 09/16/18 0945 .  norepinephrine (LEVOPHED) 16 mg in 271mL premix infusion, 0-40 mcg/min, Intravenous, Titrated, Awilda Bill, NP, Last Rate: 1.88 mL/hr at 09/17/18 0738, 2 mcg/min at 09/17/18 0738 .  ondansetron (ZOFRAN) tablet 4 mg, 4 mg, Oral, Q6H PRN **OR** ondansetron (ZOFRAN) injection 4 mg, 4 mg, Intravenous, Q6H PRN, Tressia Miners, Radhika, MD .  phenylephrine (NEO-SYNEPHRINE) 10 mg in sodium chloride 0.9 % 250 mL (0.04 mg/mL) infusion, 0-400 mcg/min, Intravenous, Titrated, Bradly Bienenstock, NP, Stopped at 09/14/18 1305 .  rosuvastatin (CRESTOR) tablet 20 mg, 20 mg, Oral, Daily, Gladstone Lighter, MD, 20 mg at 09/16/18 0945 .  vancomycin (VANCOCIN) IVPB 1000 mg/200 mL premix, 1,000 mg, Intravenous, Q24H, Gladstone Lighter, MD, Stopped at 09/16/18 1513 .  vasopressin (PITRESSIN) 40 Units in sodium chloride 0.9 % 250 mL (0.16 Units/mL) infusion, 0.03 Units/min, Intravenous, Continuous, Blakeney, Dreama Saa, NP, Stopped at 09/15/18 1829    ALLERGIES   Lipitor [atorvastatin]; Other; and Viagra  [sildenafil citrate]    REVIEW OF SYSTEMS   Unable to obtain as patient is s/p surgery post anesthesia.   PHYSICAL EXAMINATION   Vitals:   09/17/18 0500 09/17/18 0600  BP: 118/76 128/82  Pulse: (!) 113 (!) 102  Resp: (!) 21 (!) 21  Temp: 98.4 F (36.9 C)   SpO2: 97% 99%    GENERAL:NAD, resting in bed.  HEAD: Normocephalic, atraumatic.  EYES: Pupils equal, round, reactive to light.  No scleral icterus.  MOUTH: Moist mucosal membrane. NECK: Supple. No thyromegaly. No nodules. No JVD.  PULMONARY: CTAB CARDIOVASCULAR: S1 and S2. Regular rate and rhythm. No murmurs, rubs, or gallops.   GASTROINTESTINAL: Soft, nontender, non-distended. No masses. Positive bowel sounds. No hepatosplenomegaly.  MUSCULOSKELETAL: No swelling, clubbing, or edema.  NEUROLOGIC: Mild distress due to acute illness SKIN:intact,warm,dry   LABS AND IMAGING       LAB RESULTS: Recent Labs  Lab 09/15/18 0436 09/16/18 0459 09/17/18 0543  NA 127* 133* 135  K 3.6 3.5 3.4*  CL 90* 96* 98  CO2 22 23 22   BUN 27* 20 19  CREATININE 2.82* 2.20* 2.46*  GLUCOSE 213* 197* 201*   Recent Labs  Lab 09/15/18 0436  09/15/18 2255 09/16/18 0459 09/17/18 0543  HGB 7.5*   < > 9.4* 9.6* 9.5*  HCT 23.2*   < > 28.4* 29.1* 29.3*  WBC 5.5  --   --  6.8 7.9  PLT 158  --   --  193 227   < > = values in this interval not displayed.     IMAGING RESULTS: No results found.  ASSESSMENT AND PLAN     -Multidisciplinary rounds held today   Septic shock - improved -Possibly due to urinary tract infection with severe AKI stage IV -concern forpossiblePJI of bilateral knees as they are hot to touch, swollen/tender, patient with fevers, chills hypotension  -follow ABG and LA -follow up cultures Leukocytosis improved on antibiotics -s/p Left knee washout of septic arthritis   Acute blood loss anemia -status post PRBC transfusion -No obvious source of grossly appreciable bleeding -3 BMs yesteday - will send for occult blood next BM -Monitor H&H - will consider DIC - fibrinogenis adequate - pathology slide consultation-peripheral blood film normal    Acute kidney injury stage IV -DC nonessential nephrotoxic meds -Nephrology consultation- GFR improved overnight -Status post IV fluid rehydration-net positive 8 L -Developing crackles at the bases- cxr pending   Diabetes mellitus with uncontrolled hyperglycemia -On insulin sliding scale AC at bedtime -Starting nightly long-acting Lantus 8 units subcu for now    ID -continue IV abx as  prescibed -follow up cultures  GI/Nutrition GI PROPHYLAXIS as indicated DIET-->TF's as tolerated Constipation protocol as indicated  ENDO - ICU hypoglycemic\Hyperglycemia protocol -check FSBS per protocol   ELECTROLYTES -follow labs as needed -replace as needed -pharmacy consultation   DVT/GI PRX ordered -SCDs  TRANSFUSIONS AS NEEDED MONITOR FSBS ASSESS the need for LABS as needed   Critical care provider statement:  Critical care time (minutes):32 Critical care time was exclusive of: Separately billable procedures and treating other patients Critical care was necessary to treat or prevent imminent or life-threatening deterioration of the following conditions: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 I assumed direction of critical care for this patient from another provider in my specialty: no   Ottie Glazier, M.D.  Division of [place]

## 2018-09-17 NOTE — Transfer of Care (Signed)
Immediate Anesthesia Transfer of Care Note  Patient: Isaac Cross  Procedure(s) Performed: I &D left knee and polyethylene exchange (Left )  Patient Location: PACU  Anesthesia Type:General  Level of Consciousness: awake and patient cooperative  Airway & Oxygen Therapy: Patient Spontanous Breathing and Patient connected to face mask oxygen  Post-op Assessment: Report given to RN and Post -op Vital signs reviewed and stable  Post vital signs: Reviewed and stable  Last Vitals:  Vitals Value Taken Time  BP    Temp    Pulse 113 09/17/2018  2:09 PM  Resp 30 09/17/2018  2:09 PM  SpO2 99 % 09/17/2018  2:09 PM  Vitals shown include unvalidated device data.  Last Pain:  Vitals:   09/17/18 0800  TempSrc: Oral  PainSc: 0-No pain      Patients Stated Pain Goal: 0 (03/79/55 8316)  Complications: No apparent anesthesia complications

## 2018-09-17 NOTE — Op Note (Signed)
DATE OF SURGERY:  09/17/2018 TIME: 1:30 PM  PATIENT NAME:  Isaac Cross   AGE: 58 y.o.    PRE-OPERATIVE DIAGNOSIS:  left knee septic arthritis  POST-OPERATIVE DIAGNOSIS:  Same  PROCEDURE:  Procedure(s): I &D left knee and polyethylene exchange  SURGEON:  Lovell Sheehan, MD   ASSISTANT:  None  OPERATIVE IMPLANTS: 11 mm HighFlex insert   PREOPERATIVE INDICATIONS:  Isaac Cross is an 58 y.o. male who has a diagnosis of left knee septic arthritis and elected for &D left knee and polyethylene exchange.  The risks, benefits, and alternatives were discussed at length including but not limited to the risks of persistent infection, bleeding, nerve or blood vessel injury, knee stiffness, fracture, dislocation, loosening or failure of the hardware and the need for further surgery. Medical risks include but not limited to DVT and pulmonary embolism, myocardial infarction, stroke, pneumonia, respiratory failure and death. I discussed these risks with the patient and his wife prior to the date of surgery. They understood these risks and were willing to proceed.  OPERATIVE FINDINGS AND UNIQUE ASPECTS OF THE CASE: Serosanguinous fluid throughout with maceration of the tissues. The implants were well fixed.   OPERATIVE DESCRIPTION:  The patient was brought to the operative room and placed in a supine position after undergoing placement of a general anesthetic. The lower extremity was prepped and draped in the usual sterile fashion.  A time out was performed to verify the patient's name, date of birth, medical record number, correct site of surgery and correct procedure to be performed. The timeout was also used to confirm the patient received antibiotics and that appropriate instruments, implants and radiographs studies were available in the room.  The leg was elevated and exsanguinated with an Esmarch and the tourniquet was inflated to 275 mmHg.  The prior incision was utilized over the left  knee.. A medial parapatellar arthrotomy was then made and the patella subluxed laterally and the knee was brought into 90 of flexion. Suture material was removed and the medial and lateral gutters were sharply debrided and large amounts of scar and thickened synovium was passed from the field. A curette was used to debride the exposed areas of bone. Deep cultures were sent from each of the 3 compartments of the knee. Over 9 liters of bacitracin laced fluid was irrigated throughout the knee. The implants were also scrubbed with clean laps.  The polyethylene liner was removed and passed from the field. Debridement was performed posteriorly. The area was re-draped and new instruments and gloves were then used. The new liner was placed.  The medial arthrotomy was closed with #1 Vicryl. The subcutaneous tissue closed with  2-0 vicryl, and skin approximated with staples.  A dry sterile and compressive dressing was applied.  A Polar Care was applied to the operative knee.  The patient was awakened and brought to the PACU in stable and satisfactory condition.  All sharp, lap and instrument counts were correct at the conclusion the case. I spoke with the patient's family in the postop consultation room to let them know the case had been performed without complication and the patient was stable in recovery room.   Total tourniquet time was 46 minutes.

## 2018-09-17 NOTE — Progress Notes (Signed)
Subjective:  Patient reports pain as mild.  Left knee pain. Denies pain in the right knee.  Objective:   VITALS:   Vitals:   09/17/18 0315 09/17/18 0400 09/17/18 0500 09/17/18 0600  BP:  114/76 118/76 128/82  Pulse:  (!) 104 (!) 113 (!) 102  Resp:  (!) 30 (!) 21 (!) 21  Temp: (!) 101.4 F (38.6 C)  98.4 F (36.9 C)   TempSrc: Axillary  Oral   SpO2:  96% 97% 99%  Weight:      Height:        PHYSICAL EXAM:  Neurovascular intact Dorsiflexion/Plantar flexion intact Incision: no drainage Compartment soft  LABS  Results for orders placed or performed during the hospital encounter of 09/12/18 (from the past 24 hour(s))  Glucose, capillary     Status: Abnormal   Collection Time: 09/16/18 11:31 AM  Result Value Ref Range   Glucose-Capillary 201 (H) 70 - 99 mg/dL  Glucose, capillary     Status: Abnormal   Collection Time: 09/16/18  3:42 PM  Result Value Ref Range   Glucose-Capillary 191 (H) 70 - 99 mg/dL  Vancomycin, peak     Status: Abnormal   Collection Time: 09/16/18  4:10 PM  Result Value Ref Range   Vancomycin Pk 22 (L) 30 - 40 ug/mL  Glucose, capillary     Status: Abnormal   Collection Time: 09/16/18  9:40 PM  Result Value Ref Range   Glucose-Capillary 211 (H) 70 - 99 mg/dL  CBC     Status: Abnormal   Collection Time: 09/17/18  5:43 AM  Result Value Ref Range   WBC 7.9 4.0 - 10.5 K/uL   RBC 3.58 (L) 4.22 - 5.81 MIL/uL   Hemoglobin 9.5 (L) 13.0 - 17.0 g/dL   HCT 29.3 (L) 39.0 - 52.0 %   MCV 81.8 80.0 - 100.0 fL   MCH 26.5 26.0 - 34.0 pg   MCHC 32.4 30.0 - 36.0 g/dL   RDW 14.6 11.5 - 15.5 %   Platelets 227 150 - 400 K/uL   nRBC 0.0 0.0 - 0.2 %  Basic metabolic panel     Status: Abnormal   Collection Time: 09/17/18  5:43 AM  Result Value Ref Range   Sodium 135 135 - 145 mmol/L   Potassium 3.4 (L) 3.5 - 5.1 mmol/L   Chloride 98 98 - 111 mmol/L   CO2 22 22 - 32 mmol/L   Glucose, Bld 201 (H) 70 - 99 mg/dL   BUN 19 6 - 20 mg/dL   Creatinine, Ser 2.46 (H)  0.61 - 1.24 mg/dL   Calcium 9.1 8.9 - 10.3 mg/dL   GFR calc non Af Amer 27 (L) >60 mL/min   GFR calc Af Amer 32 (L) >60 mL/min   Anion gap 15 5 - 15  Magnesium     Status: Abnormal   Collection Time: 09/17/18  5:43 AM  Result Value Ref Range   Magnesium 1.5 (L) 1.7 - 2.4 mg/dL  Phosphorus     Status: None   Collection Time: 09/17/18  5:43 AM  Result Value Ref Range   Phosphorus 4.5 2.5 - 4.6 mg/dL  Glucose, capillary     Status: Abnormal   Collection Time: 09/17/18  7:35 AM  Result Value Ref Range   Glucose-Capillary 192 (H) 70 - 99 mg/dL    Ct Knee Left Wo Contrast  Result Date: 09/15/2018 CLINICAL DATA:  Knee replacement, bilateral knee pain.  Sepsis. EXAM: CT OF THE left KNEE  WITHOUT CONTRAST TECHNIQUE: Multidetector CT imaging of the left knee was performed according to the standard protocol. Multiplanar CT image reconstructions were also generated. COMPARISON:  Radiographs 09/12/2018 FINDINGS: Bones/Joint/Cartilage Total knee prosthesis in place. No significant abnormal lucency along the metal-methacrylate it or methacrylate-bone interfaces at this time. There may be a tiny amount of gas just above the anterior portion of the tibial tray on image 53/2 which would be unusual for 2 weeks out from surgery, but the patient has had recent arthrocentesis or drain in place than back because for gas in the joint. Moderate to large joint effusion with some speckled calcifications along the margins of the suprapatellar bursa for example as shown on image 12/3. Free osteochondral fragments are present posteromedially in the joint along the margin of the polymer spacer. No complicating feature of the patellar component is identified. There is a moderate size Baker's cyst posteromedially. In addition there is a suggestion of a somewhat distended popliteus recess potentially companion containing at least 1 free osteochondral fragment. Ligaments Suboptimally assessed by CT. Muscles and Tendons There is  appearance of at least partial disruption of the distal quadriceps tendon. Soft tissues Diffuse subcutaneous edema along the visualized portion of the distal thigh and proximal calf. IMPRESSION: 1. Moderate to large joint effusion, potentially with a tiny amount of loculated gas just above the anterior portion of the tibial tray best appreciated on the axial images. This would be unusual 2 weeks out from surgery, and could possibly indicate infection, but if the patient has had recent arthrocentesis or drain placement in the joint then sterile gas may have been introduced at that time. Arthrocentesis could be utilized for definitive assessment of infection of the joint fluid. 2. There is no abnormal lucency along the margins of the implant. 3. Several free osteochondral fragments posteromedial to the joint line and also in the popliteus recess. 4. Partial disruption of the distal quadriceps tendon. Depending on the type of surgery employed (traditional versus minimally invasive) , this can be a normal postoperative finding. 5. Subcutaneous edema along the distal thigh and proximal calf. Electronically Signed   By: Van Clines M.D.   On: 09/15/2018 16:36   Ct Knee Right Wo Contrast  Result Date: 09/15/2018 CLINICAL DATA:  Bilateral knee pain. Sepsis. Knee replacement 2 weeks ago. EXAM: CT OF THE right KNEE WITHOUT CONTRAST TECHNIQUE: Multidetector CT imaging of the right knee was performed according to the standard protocol. Multiplanar CT image reconstructions were also generated. COMPARISON:  None. FINDINGS: Bones/Joint/Cartilage Right total knee prosthesis in place without abnormal lucency along the components the prosthesis to suggest loosening or bony findings of infection. No bony destructive findings are observed. No well-defined periprosthetic fracture. Expected alignment of components of the implant. Moderate-sized knee joint effusion with scattered calcifications along the margins of the  suprapatellar bursa. No appreciable gas in the joint. Small well corticated ossicle along the tibial tubercle, likely from remote Osgood-Schlatter disease. Moderate-sized Baker's cyst. There is evidence of an old fibular shaft fracture which has healed. Small amount of chronic appearing heterotopic ossification along the posterior margin of the soleus muscle. Ligaments Suboptimally assessed by CT. Muscles and Tendons Small amount of heterotopic calcification along the posterior margin of the soleus muscle. There is some irregularity along the medial distal quadriceps possibly from partial release, correlate with operative history. The defect in the quadriceps is smaller than on the contralateral side. Soft tissues Moderate subcutaneous edema in the distal thigh and proximal calf. Medial subcutaneous varicosities in  the proximal calf. IMPRESSION: 1. Moderate-sized knee joint effusion extending into the suprapatellar bursa. Moderate size Baker's cyst. No appreciable gas in the joint or bony destructive findings. No abnormal lucency along the implant. 2. Irregularity in the medial distal quadriceps tendon potentially from partial release or a port into the joint, correlate with operative history. 3. Well corticated ossicle in the distal patellar tendon likely from remote Osgood-Schlatter disease. 4. Moderate subcutaneous edema in the region. Electronically Signed   By: Van Clines M.D.   On: 09/15/2018 16:45    Assessment/Plan: Day of Surgery   Active Problems:   Sepsis ([place])   Plan irrigation and debridement with polyethylene liner exchange today of the left knee. We discussed the overall treatment plan. The right knee exam is benign and the cell count < 3,000 WBC. There is the possibility of right knee involvement, however given the current findings and his need for ICU management the plan is to proceed with only the left knee at this time and closely following the right knee. There is an approximate  success rate of 75% with I&D with poly exchange given the index procedure was less than 3 weeks ago. He will require long term IV antibiotics and may ultimately require a two stage revision. He understand and is ready to proceed.  This plan is also discussed with his wife and she agrees to proceed.  Lovell Sheehan , MD 09/17/2018, 9:29 AM

## 2018-09-18 ENCOUNTER — Inpatient Hospital Stay: Payer: BC Managed Care – PPO | Admitting: Anesthesiology

## 2018-09-18 ENCOUNTER — Encounter
Admission: EM | Disposition: A | Discharge status: Home Health | Payer: Self-pay | Source: Home / Self Care | Attending: Internal Medicine

## 2018-09-18 DIAGNOSIS — T8454XA Infection and inflammatory reaction due to internal left knee prosthesis, initial encounter: Principal | ICD-10-CM

## 2018-09-18 DIAGNOSIS — R6521 Severe sepsis with septic shock: Secondary | ICD-10-CM

## 2018-09-18 HISTORY — PX: I & D KNEE WITH POLY EXCHANGE: SHX5024

## 2018-09-18 LAB — BASIC METABOLIC PANEL
Anion gap: 13 (ref 5–15)
BUN: 20 mg/dL (ref 6–20)
CO2: 21 mmol/L — ABNORMAL LOW (ref 22–32)
Calcium: 8.8 mg/dL — ABNORMAL LOW (ref 8.9–10.3)
Chloride: 102 mmol/L (ref 98–111)
Creatinine, Ser: 2.6 mg/dL — ABNORMAL HIGH (ref 0.61–1.24)
GFR calc Af Amer: 30 mL/min — ABNORMAL LOW (ref >60)
GFR calc non Af Amer: 26 mL/min — ABNORMAL LOW (ref >60)
Glucose, Bld: 186 mg/dL — ABNORMAL HIGH (ref 70–99)
Potassium: 3.7 mmol/L (ref 3.5–5.1)
Sodium: 136 mmol/L (ref 135–145)

## 2018-09-18 LAB — VANCOMYCIN, RANDOM: Vancomycin Rm: 11

## 2018-09-18 LAB — GLUCOSE, CAPILLARY
Glucose-Capillary: 181 mg/dL — ABNORMAL HIGH (ref 70–99)
Glucose-Capillary: 179 mg/dL — ABNORMAL HIGH (ref 70–99)
Glucose-Capillary: 175 mg/dL — ABNORMAL HIGH (ref 70–99)
Glucose-Capillary: 156 mg/dL — ABNORMAL HIGH (ref 70–99)

## 2018-09-18 LAB — PROCALCITONIN: Procalcitonin: 1.25 ng/mL

## 2018-09-18 LAB — MAGNESIUM: Magnesium: 1.7 mg/dL (ref 1.7–2.4)

## 2018-09-18 SURGERY — IRRIGATION AND DEBRIDEMENT KNEE WITH POLY EXCHANGE
Anesthesia: General | Laterality: Right

## 2018-09-18 MED ORDER — SUGAMMADEX SODIUM 500 MG/5ML IV SOLN
INTRAVENOUS | Status: DC | PRN
  Administered 2018-09-18: 300 mg via INTRAVENOUS

## 2018-09-18 MED ORDER — OXYCODONE HCL 5 MG PO TABS
10.0000 mg | ORAL_TABLET | ORAL | Status: DC | PRN
  Administered 2018-09-19: 04:00:00 10 mg via ORAL
  Filled 2018-09-18 (×2): qty 2

## 2018-09-18 MED ORDER — POTASSIUM CHLORIDE 20 MEQ PO PACK
40.0000 meq | PACK | Freq: Once | ORAL | Status: AC
  Administered 2018-09-18: 40 meq via ORAL
  Filled 2018-09-18: qty 2

## 2018-09-18 MED ORDER — ONDANSETRON HCL 4 MG/2ML IJ SOLN: 4.0000 mg | Freq: Once | INTRAMUSCULAR | Status: DC | PRN

## 2018-09-18 MED ORDER — LACTATED RINGERS IV BOLUS
500.0000 mL | Freq: Once | INTRAVENOUS | Status: AC
  Administered 2018-09-18: 500 mL via INTRAVENOUS

## 2018-09-18 MED ORDER — LACTATED RINGERS IV SOLN
INTRAVENOUS | Status: AC
  Administered 2018-09-18: 10:00:00 via INTRAVENOUS

## 2018-09-18 MED ORDER — SUCCINYLCHOLINE CHLORIDE 20 MG/ML IJ SOLN
INTRAMUSCULAR | Status: DC | PRN
  Administered 2018-09-18: 160 mg via INTRAVENOUS

## 2018-09-18 MED ORDER — LACTATED RINGERS IV SOLN: INTRAVENOUS | Status: DC

## 2018-09-18 MED ORDER — SODIUM CHLORIDE 0.9 % IV SOLN
INTRAVENOUS | Status: DC | PRN
  Administered 2018-09-18: 30 ug/min via INTRAVENOUS

## 2018-09-18 MED ORDER — ETOMIDATE 2 MG/ML IV SOLN
INTRAVENOUS | Status: AC
  Filled 2018-09-18: qty 20

## 2018-09-18 MED ORDER — SODIUM CHLORIDE FLUSH 0.9 % IV SOLN
INTRAVENOUS | Status: AC
  Administered 2018-09-19: 10 mL
  Filled 2018-09-18: qty 20

## 2018-09-18 MED ORDER — FENTANYL CITRATE (PF) 100 MCG/2ML IJ SOLN
INTRAMUSCULAR | Status: AC
  Filled 2018-09-18: qty 2

## 2018-09-18 MED ORDER — FENTANYL CITRATE (PF) 100 MCG/2ML IJ SOLN
INTRAMUSCULAR | Status: DC | PRN
  Administered 2018-09-18: 50 ug via INTRAVENOUS
  Administered 2018-09-18 (×2): 25 ug via INTRAVENOUS

## 2018-09-18 MED ORDER — TRANEXAMIC ACID 1000 MG/10ML IV SOLN
INTRAVENOUS | Status: DC | PRN
  Administered 2018-09-18: 2000 mg via TOPICAL

## 2018-09-18 MED ORDER — VANCOMYCIN HCL 10 G IV SOLR
1250.0000 mg | INTRAVENOUS | Status: DC
  Administered 2018-09-18: 14:00:00 1250 mg via INTRAVENOUS
  Filled 2018-09-18 (×2): qty 1250

## 2018-09-18 MED ORDER — FENTANYL CITRATE (PF) 100 MCG/2ML IJ SOLN: 25.0000 ug | INTRAMUSCULAR | Status: DC | PRN

## 2018-09-18 MED ORDER — TRANEXAMIC ACID 1000 MG/10ML IV SOLN
2000.0000 mg | Freq: Once | INTRAVENOUS | Status: DC
  Filled 2018-09-18: qty 20

## 2018-09-18 MED ORDER — LIDOCAINE HCL (CARDIAC) PF 100 MG/5ML IV SOSY
PREFILLED_SYRINGE | INTRAVENOUS | Status: DC | PRN
  Administered 2018-09-18: 100 mg via INTRAVENOUS

## 2018-09-18 MED ORDER — SUCCINYLCHOLINE CHLORIDE 20 MG/ML IJ SOLN
INTRAMUSCULAR | Status: AC
  Filled 2018-09-18: qty 1

## 2018-09-18 MED ORDER — ETOMIDATE 2 MG/ML IV SOLN
INTRAVENOUS | Status: DC | PRN
  Administered 2018-09-18: 20 mg via INTRAVENOUS

## 2018-09-18 MED ORDER — MAGNESIUM SULFATE 2 GM/50ML IV SOLN
2.0000 g | Freq: Once | INTRAVENOUS | Status: AC
  Administered 2018-09-18: 2 g via INTRAVENOUS
  Filled 2018-09-18: qty 50

## 2018-09-18 MED ORDER — ROCURONIUM BROMIDE 100 MG/10ML IV SOLN
INTRAVENOUS | Status: DC | PRN
  Administered 2018-09-18: 50 mg via INTRAVENOUS

## 2018-09-18 MED ORDER — LIDOCAINE HCL (PF) 2 % IJ SOLN
INTRAMUSCULAR | Status: AC
  Filled 2018-09-18: qty 10

## 2018-09-18 MED ORDER — ONDANSETRON HCL 4 MG/2ML IJ SOLN
INTRAMUSCULAR | Status: AC
  Filled 2018-09-18: qty 2

## 2018-09-18 MED ORDER — PROPOFOL 10 MG/ML IV BOLUS
INTRAVENOUS | Status: AC
  Filled 2018-09-18: qty 20

## 2018-09-18 MED ORDER — LACTATED RINGERS IV SOLN
INTRAVENOUS | Status: DC | PRN
  Administered 2018-09-18: 21:00:00 via INTRAVENOUS

## 2018-09-18 SURGICAL SUPPLY — 67 items
BASIN GRAD PLASTIC 32OZ STRL (MISCELLANEOUS) ×3 IMPLANT
BLADE SAW 18WX90L 1.27 THK (BLADE) ×3 IMPLANT
BLADE SAW SAG 25X90X1.19 (BLADE) ×3 IMPLANT
BOWL CEMENT MIX W/ADAPTER (MISCELLANEOUS) ×3 IMPLANT
BRUSH SCRUB EZ  4% CHG (MISCELLANEOUS) ×4
BRUSH SCRUB EZ 4% CHG (MISCELLANEOUS) ×2 IMPLANT
CANISTER SUCT 1200ML W/VALVE (MISCELLANEOUS) ×3 IMPLANT
CANISTER SUCT 3000ML PPV (MISCELLANEOUS) ×6 IMPLANT
CHLORAPREP W/TINT 26 (MISCELLANEOUS) ×6 IMPLANT
CNTNR SPEC 2.5X3XGRAD LEK (MISCELLANEOUS) ×3
CONT SPEC 4OZ STER OR WHT (MISCELLANEOUS) ×6
CONTAINER SPEC 2.5X3XGRAD LEK (MISCELLANEOUS) ×3 IMPLANT
COOLER POLAR GLACIER W/PUMP (MISCELLANEOUS) ×6 IMPLANT
COVER WAND RF STERILE (DRAPES) ×3 IMPLANT
CUFF TOURN SGL QUICK 30 (TOURNIQUET CUFF)
CUFF TOURN SGL QUICK 42 (TOURNIQUET CUFF) ×3 IMPLANT
CUFF TRNQT CYL 30X4X21-28X (TOURNIQUET CUFF) IMPLANT
DECANTER SPIKE VIAL GLASS SM (MISCELLANEOUS) ×3 IMPLANT
DRAPE INCISE IOBAN 66X60 STRL (DRAPES) IMPLANT
DRAPE SHEET LG 3/4 BI-LAMINATE (DRAPES) ×6 IMPLANT
DRSG TEGADERM 6X8 (GAUZE/BANDAGES/DRESSINGS) ×3 IMPLANT
ELECT REM PT RETURN 9FT ADLT (ELECTROSURGICAL) ×3
ELECTRODE REM PT RTRN 9FT ADLT (ELECTROSURGICAL) ×1 IMPLANT
GAUZE SPONGE 4X4 12PLY STRL (GAUZE/BANDAGES/DRESSINGS) ×3 IMPLANT
GAUZE SPONGE 4X4 16PLY XRAY LF (GAUZE/BANDAGES/DRESSINGS) ×3 IMPLANT
GAUZE XEROFORM 1X8 LF (GAUZE/BANDAGES/DRESSINGS) ×3 IMPLANT
GLOVE BIO SURGEON STRL SZ8 (GLOVE) ×3 IMPLANT
GLOVE BIOGEL PI IND STRL 8.5 (GLOVE) ×1 IMPLANT
GLOVE BIOGEL PI INDICATOR 8.5 (GLOVE) ×2
GLOVE INDICATOR 8.0 STRL GRN (GLOVE) ×6 IMPLANT
GLOVE SURG ORTHO 8.0 STRL STRW (GLOVE) ×9 IMPLANT
GOWN STRL REUS W/ TWL LRG LVL3 (GOWN DISPOSABLE) ×1 IMPLANT
GOWN STRL REUS W/ TWL XL LVL3 (GOWN DISPOSABLE) ×1 IMPLANT
GOWN STRL REUS W/TWL LRG LVL3 (GOWN DISPOSABLE) ×2
GOWN STRL REUS W/TWL XL LVL3 (GOWN DISPOSABLE) ×2
HANDLE YANKAUER SUCT BULB TIP (MISCELLANEOUS) ×3 IMPLANT
HOLDER FOLEY CATH W/STRAP (MISCELLANEOUS) ×3 IMPLANT
HOLDER KNEE ALVARADO LINER (MISCELLANEOUS) ×3 IMPLANT
HOOD PEEL AWAY FLYTE STAYCOOL (MISCELLANEOUS) ×12 IMPLANT
INSERT ARTI HI FLEX 9 SZ 7-8 (Insert) ×3 IMPLANT
IV NS 1000ML (IV SOLUTION)
IV NS 1000ML BAXH (IV SOLUTION) IMPLANT
KIT TURNOVER KIT A (KITS) ×3 IMPLANT
MAT ABSORB  FLUID 56X50 GRAY (MISCELLANEOUS) ×2
MAT ABSORB FLUID 56X50 GRAY (MISCELLANEOUS) ×1 IMPLANT
NDL SAFETY ECLIPSE 18X1.5 (NEEDLE) ×1 IMPLANT
NEEDLE HYPO 18GX1.5 SHARP (NEEDLE) ×2
NEEDLE SPNL 20GX3.5 QUINCKE YW (NEEDLE) ×3 IMPLANT
NS IRRIG 1000ML POUR BTL (IV SOLUTION) ×3 IMPLANT
PACK TOTAL KNEE (MISCELLANEOUS) ×3 IMPLANT
PAD DE MAYO PRESSURE PROTECT (MISCELLANEOUS) ×3 IMPLANT
PAD WRAPON POLAR KNEE (MISCELLANEOUS) ×2 IMPLANT
PULSAVAC PLUS IRRIG FAN TIP (DISPOSABLE) ×3
STAPLER SKIN PROX 35W (STAPLE) ×6 IMPLANT
STOCKINETTE BIAS CUT 6 980064 (GAUZE/BANDAGES/DRESSINGS) ×3 IMPLANT
SUCTION FRAZIER HANDLE 10FR (MISCELLANEOUS) ×2
SUCTION TUBE FRAZIER 10FR DISP (MISCELLANEOUS) ×1 IMPLANT
SUT DVC 2 QUILL PDO  T11 36X36 (SUTURE) ×2
SUT DVC 2 QUILL PDO T11 36X36 (SUTURE) ×1 IMPLANT
SUT VIC AB 2-0 CT1 18 (SUTURE) ×3 IMPLANT
SUT VIC AB 2-0 CT1 27 (SUTURE) ×2
SUT VIC AB 2-0 CT1 TAPERPNT 27 (SUTURE) ×1 IMPLANT
SUT VIC AB PLUS 45CM 1-MO-4 (SUTURE) ×6 IMPLANT
SYR 30ML LL (SYRINGE) ×9 IMPLANT
SYR 50ML LL SCALE MARK (SYRINGE) ×3 IMPLANT
TIP FAN IRRIG PULSAVAC PLUS (DISPOSABLE) ×1 IMPLANT
WRAPON POLAR PAD KNEE (MISCELLANEOUS) ×6

## 2018-09-18 NOTE — Transfer of Care (Signed)
Immediate Anesthesia Transfer of Care Note  Patient: NICODEMUS DENK  Procedure(s) Performed: IRRIGATION AND DEBRIDEMENT KNEE WITH POLY EXCHANGE (Right )  Patient Location: PACU  Anesthesia Type:General  Level of Consciousness: oriented and patient cooperative  Airway & Oxygen Therapy: Patient Spontanous Breathing and Patient connected to face mask oxygen  Post-op Assessment: Report given to RN and Post -op Vital signs reviewed and stable  Post vital signs: Reviewed and stable  Last Vitals:  Vitals Value Taken Time  BP 87/52 09/18/2018 11:33 PM  Temp    Pulse 114 09/18/2018 11:37 PM  Resp 13 09/18/2018 11:37 PM  SpO2 98 % 09/18/2018 11:37 PM  Vitals shown include unvalidated device data.  Last Pain:  Vitals:   09/18/18 2303  TempSrc:   PainSc: 0-No pain      Patients Stated Pain Goal: 0 (02/11/84 2778)  Complications: No apparent anesthesia complications

## 2018-09-18 NOTE — Progress Notes (Signed)
PT Cancellation Note  Patient Details Name: Isaac Cross MRN: [id number] DOB: 04/19/1961   Cancelled Treatment:    Reason Eval/Treat Not Completed: Patient not medically ready.  Pt noted to be resting in bed with HR 130 bpm at rest.  Discussed HR concern with pt's nurse.  D/t elevated HR at rest, will hold PT at this time and re-attempt PT session tomorrow.  Leitha Bleak, PT 09/18/18, 1:46 PM [phone]

## 2018-09-18 NOTE — Op Note (Signed)
DATE OF SURGERY:  09/18/2018 TIME: 11:04 PM  PATIENT NAME:  Isaac Cross   AGE: 58 y.o.    PRE-OPERATIVE DIAGNOSIS:  INFECTED RIGHT KNEE  POST-OPERATIVE DIAGNOSIS:  Same  PROCEDURE:  Procedure(s): IRRIGATION AND DEBRIDEMENT KNEE WITH POLY EXCHANGE, RIGHT KNEE  SURGEON:  Lovell Sheehan, MD   ASSISTANT:  None  OPERATIVE IMPLANTS: 9 mm HighFlex insert   PREOPERATIVE INDICATIONS:  Isaac Cross is an 58 y.o. male who has a presumptive diagnosis of right knee septic arthritis and elected for &D left knee and polyethylene exchange.  The risks, benefits, and alternatives were discussed at length including but not limited to the risks of persistent infection, bleeding, nerve or blood vessel injury, knee stiffness, fracture, dislocation, loosening or failure of the hardware and the need for further surgery. Medical risks include but not limited to DVT and pulmonary embolism, myocardial infarction, stroke, pneumonia, respiratory failure and death. I discussed these risks with the patient and his wife prior to the date of surgery. They understood these risks and were willing to proceed.  OPERATIVE FINDINGS AND UNIQUE ASPECTS OF THE CASE: Serosanguinous fluid throughout with maceration of the tissues. The implants were well fixed.   OPERATIVE DESCRIPTION:  The patient was brought to the operative room and placed in a supine position after undergoing placement of a general anesthetic. The lower extremity was prepped and draped in the usual sterile fashion.  A time out was performed to verify the patient's name, date of birth, medical record number, correct site of surgery and correct procedure to be performed. The timeout was also used to confirm the patient received antibiotics and that appropriate instruments, implants and radiographs studies were available in the room.  The leg was elevated and the tourniquet was inflated to 275 mmHg.  The prior incision was utilized over the left  knee.. A medial parapatellar arthrotomy was then made and the patella subluxed laterally and the knee was brought into 90 of flexion. Suture material was removed and the medial and lateral gutters were sharply debrided and large amounts of scar and thickened synovium was passed from the field. A curette was used to debride the exposed areas of bone. Deep cultures were sent from each of the 3 compartments of the knee. Over 9 liters of povidone-iodine laced fluid was irrigated throughout the knee. The implants were also scrubbed with clean laps.  The polyethylene liner was removed and passed from the field. Debridement was performed posteriorly. The area was re-draped and new instruments and gloves were then used. The new liner was placed.  The medial arthrotomy was closed with #1 Vicryl. The subcutaneous tissue closed with  2-0 vicryl, and skin approximated with staples.  A dry sterile and compressive dressing was applied.  A Polar Care was applied to the operative knee.  The patient was awakened and brought to the PACU in stable and satisfactory condition.  All sharp, lap and instrument counts were correct at the conclusion the case. I spoke with the patient's family in the postop consultation room to let them know the case had been performed without complication and the patient was stable in recovery room.   Total tourniquet time was 36 minutes.

## 2018-09-18 NOTE — Anesthesia Procedure Notes (Signed)
Procedure Name: Intubation Date/Time: 09/18/2018 9:03 PM Performed by: Lendon Colonel, CRNA Pre-anesthesia Checklist: Patient identified, Patient being monitored, Timeout performed, Emergency Drugs available and Suction available Patient Re-evaluated:Patient Re-evaluated prior to induction Oxygen Delivery Method: Circle system utilized Preoxygenation: Pre-oxygenation with 100% oxygen Induction Type: IV induction Ventilation: Mask ventilation without difficulty Laryngoscope Size: McGraph and 4 Grade View: Grade I Tube type: Oral Tube size: 7.0 mm Number of attempts: 1 Airway Equipment and Method: Stylet and Video-laryngoscopy Placement Confirmation: ETT inserted through vocal cords under direct vision,  positive ETCO2 and breath sounds checked- equal and bilateral Secured at: 21 cm Tube secured with: Tape Dental Injury: Teeth and Oropharynx as per pre-operative assessment

## 2018-09-18 NOTE — Progress Notes (Signed)
[facility] [place] NAME: Isaac Cross    MR#:  [id number]  DATE OF BIRTH:  19-Apr-1961  SUBJECTIVE:  CHIEF COMPLAINT:   Chief Complaint  Patient presents with   Dizziness   -Patient had left knee washout procedure done yesterday.  Slightly tachycardic today.  Some intermittent confusion still persistent.  Low-grade fevers noted  REVIEW OF SYSTEMS:  Review of Systems  Constitutional: Positive for fever and malaise/fatigue. Negative for chills.  HENT: Negative for ear discharge, hearing loss, nosebleeds and sinus pain.   Eyes: Negative for blurred vision and double vision.  Respiratory: Negative for cough, shortness of breath and wheezing.   Cardiovascular: Negative for chest pain and palpitations.  Gastrointestinal: Negative for abdominal pain, constipation, diarrhea, nausea and vomiting.  Genitourinary: Negative for dysuria.  Musculoskeletal: Positive for joint pain and myalgias.  Neurological: Negative for dizziness, focal weakness, seizures, weakness and headaches.  Psychiatric/Behavioral: Negative for depression.    DRUG ALLERGIES:   Allergies  Allergen Reactions   Lipitor [Atorvastatin] Hives and Itching   Other    Viagra  [Sildenafil Citrate]     vision loss    VITALS:  Blood pressure 101/83, pulse (!) 115, temperature 100.3 F (37.9 C), temperature source Axillary, resp. rate (!) 25, height 6\' 4"  (1.93 m), weight (!) 145.2 kg, SpO2 100 %.  PHYSICAL EXAMINATION:  Physical Exam   GENERAL:  58 y.o.-year-old patient lying in the bed with no acute distress.  EYES: Pupils equal, round, reactive to light and accommodation. No scleral icterus. Extraocular muscles intact.  HEENT: Head atraumatic, normocephalic. Oropharynx and nasopharynx clear.  NECK:  Supple, no jugular venous distention. No thyroid enlargement, no tenderness.  LUNGS: Normal breath sounds bilaterally, no wheezing, rales,rhonchi or crepitation. No use of  accessory muscles of respiration.  Decreased bibasilar breath sounds CARDIOVASCULAR: S1, S2 normal. No murmurs, rubs, or gallops.  ABDOMEN: Soft, nontender, nondistended. Bowel sounds present. No organomegaly or mass.  EXTREMITIES: Swelling of both knees noted with recent surgery, Steri-Strips in place.  Left knee with the ice packing.  No pedal edema, cyanosis, or clubbing.  NEUROLOGIC: Cranial nerves II through XII are intact. Muscle strength 5/5 in all extremities. Sensation intact. Gait not checked.  Global weakness noted PSYCHIATRIC: The patient is alert and oriented x2.  Intermittent confusion noted SKIN: No obvious rash, lesion, or ulcer.    LABORATORY PANEL:   CBC Recent Labs  Lab 09/17/18 0543  WBC 7.9  HGB 9.5*  HCT 29.3*  PLT 227   ------------------------------------------------------------------------------------------------------------------  Chemistries  Recent Labs  Lab 09/16/18 0459  09/18/18 0517  NA 133*   < > 136  K 3.5   < > 3.7  CL 96*   < > 102  CO2 23   < > 21*  GLUCOSE 197*   < > 186*  BUN 20   < > 20  CREATININE 2.20*   < > 2.60*  CALCIUM 8.7*   < > 8.8*  MG  --    < > 1.7  AST 143*  --   --   ALT 59*  --   --   ALKPHOS 75  --   --   BILITOT 0.8  --   --    < > = values in this interval not displayed.   ------------------------------------------------------------------------------------------------------------------  Cardiac Enzymes No results for input(s): TROPONINI in the last 168 hours. ------------------------------------------------------------------------------------------------------------------  RADIOLOGY:  No results found.  EKG:   Orders placed  or performed during the hospital encounter of 09/12/18   EKG 12-Lead   EKG 12-Lead    ASSESSMENT AND PLAN:    EdwardGradyis a58 y.o.malewith a known history of arthritis, COPD not on home oxygen, hypertension, sleep apnea, non-insulin-dependent diabetes mellitus presents to  hospital secondary to dizziness, weakness and a fall  1.Sepsis-with ongoing fevers.  Concern for left knee septic joint- given recent bilateral knee replacement surgery. -CT of both knees showing moderate to large effusions.  Status post left knee arthrocentesis and fluid showing WBCs about 10,000.  Right knee has been tapped with WBC of only 2000.  Gram stain is negative so far. -Appreciate Ortho and ID consults. -Blood cultures negative - urine cultures negative -Continue antibiotics with vancomycin and cefepime at this time -Status post left knee washout yesterday.  Follow-up cultures  2. Acute renal failure-likely ATN from sepsis and recent use of Bactrim -Creatinine was improving, however steady and slightly worse today at 2.6. -Urine output has improved and at 2 L in the last 24 hours -Renal ultrasound with no significant obstruction.  Appreciate nephrology consult. -Hold lisinopril and metformin and other nephrotoxins.  3.  Acute encephalopathy-secondary to metabolic causes, sepsis.  Monitor closely.  No focal deficits  4. Hypertension-hold lisinopril.  5. Diabetes mellitus- sliding scale insulin.  -Hold metformin given worsening renal failure.  6. DVT prophylaxis- SCD  Physical therapy consult needed   All the records are reviewed and case discussed with Care Management/Social Workerr. Management plans discussed with the patient, family and they are in agreement.  CODE STATUS: Full code  TOTAL TIME TAKING CARE OF THIS PATIENT: 38 minutes.   POSSIBLE D/C IN 3-4 DAYS, DEPENDING ON CLINICAL CONDITION.   Gladstone Lighter M.D on 09/18/2018 at 10:15 AM  Between 7am to 6pm - Pager - [phone]  After 6pm go to [email or web address] - password [email or web address] [facility]  Office  [phone]  CC: Primary care physician; Steele Sizer, MD

## 2018-09-18 NOTE — Progress Notes (Signed)
[facility]  ROUNDING NOTE   Subjective:    Intake/Output Summary (Last 24 hours) at 09/18/2018 1014 Last data filed at 09/18/2018 0600 Gross per 24 hour  Intake 1609.33 ml  Output 1950 ml  Net -340.67 ml   S Creatinine slightly higher Otherwise states he is able to eat some No acute complaints  Objective:  Vital signs in last 24 hours:  Temp:  [98.4 F (36.9 C)-100.3 F (37.9 C)] 100.3 F (37.9 C) (06/01 0215) Pulse Rate:  [97-123] 115 (06/01 0630) Resp:  [17-30] 25 (06/01 0630) BP: (93-120)/(67-102) 101/83 (06/01 0630) SpO2:  [88 %-100 %] 100 % (06/01 0630) Arterial Line BP: (78-133)/(46-88) 78/72 (06/01 0215)  Weight change:  Filed Weights   09/12/18 0935  Weight: (!) 145.2 kg    Intake/Output: I/O last 3 completed shifts: In: 1746.9 [I.V.:1062.4; IV Piggyback:684.5] Out: 8588 [Urine:4000; Blood:50]   Intake/Output this shift:  No intake/output data recorded.  Physical Exam: General: NAD, ill appearing  Head: Normocephalic, atraumatic. Moist oral mucosal membranes  Eyes: Anicteric,    Neck: Supple,    Lungs:  Clear to auscultation  Heart: tachycardia  Abdomen:  Soft, nontender, obese  Extremities:  ++ peripheral edema.  Neurologic: Alert and oriented  Skin: No lesions        Basic Metabolic Panel: Recent Labs  Lab 09/14/18 0336 09/15/18 0436 09/16/18 0459 09/17/18 0543 09/18/18 0517  NA 126* 127* 133* 135 136  K 3.8 3.6 3.5 3.4* 3.7  CL 92* 90* 96* 98 102  CO2 24 22 23 22  21*  GLUCOSE 247* 213* 197* 201* 186*  BUN 29* 27* 20 19 20   CREATININE 3.50* 2.82* 2.20* 2.46* 2.60*  CALCIUM 7.8* 8.3* 8.7* 9.1 8.8*  MG  --   --   --  1.5* 1.7  PHOS  --   --   --  4.5  --     Liver Function Tests: Recent Labs  Lab 09/12/18 0955 09/15/18 0436 09/16/18 0459  AST 70* 148* 143*  ALT 42 57* 59*  ALKPHOS 86 70 75  BILITOT 0.7 0.7 0.8  PROT 7.6 5.8* 6.2*  ALBUMIN 3.0* 1.9* 2.1*   Recent Labs  Lab 09/12/18 0955  LIPASE 74*   No  results for input(s): AMMONIA in the last 168 hours.  CBC: Recent Labs  Lab 09/12/18 0955 09/13/18 0344 09/14/18 0336 09/15/18 0436 09/15/18 1747 09/15/18 1832 09/15/18 2255 09/16/18 0459 09/17/18 0543  WBC 16.1* 9.4 5.9 5.5  --   --   --  6.8 7.9  NEUTROABS 10.3*  --  3.5 3.0  --   --   --  4.3  --   HGB 11.5* 8.7* 8.0* 7.5* 5.7* 9.3* 9.4* 9.6* 9.5*  HCT 35.7* 26.7* 24.5* 23.2* 17.4* 28.3* 28.4* 29.1* 29.3*  MCV 82.4 81.9 81.9 82.0  --   --   --  81.3 81.8  PLT 137* 137* 144* 158  --   --   --  193 227    Cardiac Enzymes: Recent Labs  Lab 09/13/18 0344  CKTOTAL 4,516*    BNP: Invalid input(s): POCBNP  CBG: Recent Labs  Lab 09/16/18 2140 09/17/18 0735 09/17/18 1610 09/17/18 2116 09/18/18 0745  GLUCAP 211* 192* 184* 30* 181*    Microbiology: Results for orders placed or performed during the hospital encounter of 09/12/18  Blood Culture (routine x 2)     Status: None   Collection Time: 09/12/18  9:56 AM  Result Value Ref Range Status  Specimen Description BLOOD LEFT HAND  Final   Special Requests   Final    BOTTLES DRAWN AEROBIC AND ANAEROBIC Blood Culture adequate volume   Culture   Final    NO GROWTH 5 DAYS Performed at [facility], [address] [place], [place] [address]    Report Status 09/17/2018 FINAL  Final  Urine culture     Status: None   Collection Time: 09/12/18 10:01 AM  Result Value Ref Range Status   Specimen Description   Final    URINE, RANDOM Performed at [facility], [address] [place], [place] [address]    Special Requests   Final    NONE Performed at [facility], [address], [place], [place] [address]    Culture   Final    NO GROWTH Performed at [facility], [place] [address] [place], [place] [address]    Report Status 09/13/2018 FINAL  Final  SARS Coronavirus 2 (CEPHEID- Performed in [place] hospital lab), Hosp Order     Status: None   Collection Time: 09/12/18 10:02 AM   Result Value Ref Range Status   SARS Coronavirus 2 NEGATIVE NEGATIVE Final    Comment: (NOTE) If result is NEGATIVE SARS-CoV-2 target nucleic acids are NOT DETECTED. The SARS-CoV-2 RNA is generally detectable in upper and lower  respiratory specimens during the acute phase of infection. The lowest  concentration of SARS-CoV-2 viral copies this assay can detect is 250  copies / mL. A negative result does not preclude SARS-CoV-2 infection  and should not be used as the sole basis for treatment or other  patient management decisions.  A negative result may occur with  improper specimen collection / handling, submission of specimen other  than nasopharyngeal swab, presence of viral mutation(s) within the  areas targeted by this assay, and inadequate number of viral copies  (<250 copies / mL). A negative result must be combined with clinical  observations, patient history, and epidemiological information. If result is POSITIVE SARS-CoV-2 target nucleic acids are DETECTED. The SARS-CoV-2 RNA is generally detectable in upper and lower  respiratory specimens dur ing the acute phase of infection.  Positive  results are indicative of active infection with SARS-CoV-2.  Clinical  correlation with patient history and other diagnostic information is  necessary to determine patient infection status.  Positive results do  not rule out bacterial infection or co-infection with other viruses. If result is PRESUMPTIVE POSTIVE SARS-CoV-2 nucleic acids MAY BE PRESENT.   A presumptive positive result was obtained on the submitted specimen  and confirmed on repeat testing.  While 2019 novel coronavirus  (SARS-CoV-2) nucleic acids may be present in the submitted sample  additional confirmatory testing may be necessary for epidemiological  and / or clinical management purposes  to differentiate between  SARS-CoV-2 and other Sarbecovirus currently known to infect humans.  If clinically indicated additional  testing with an alternate test  methodology [id number]) is advised. The SARS-CoV-2 RNA is generally  detectable in upper and lower respiratory sp ecimens during the acute  phase of infection. The expected result is Negative. Fact Sheet for Patients:  StrictlyIdeas.no Fact Sheet for Healthcare Providers: BankingDealers.co.za This test is not yet approved or cleared by the [place] FDA and has been authorized for detection and/or diagnosis of SARS-CoV-2 by FDA under an Emergency Use Authorization (EUA).  This EUA will remain in effect (meaning this test can be used) for the duration of the COVID-19 declaration under Section  564(b)(1) of the Act, 21 U.S.C. section 360bbb-3(b)(1), unless the authorization is terminated or revoked sooner. Performed at [facility], [address] [place], [place] [address]   Blood Culture (routine x 2)     Status: None   Collection Time: 09/12/18 10:06 AM  Result Value Ref Range Status   Specimen Description BLOOD LEFT ANTECUBITAL  Final   Special Requests   Final    BOTTLES DRAWN AEROBIC AND ANAEROBIC Blood Culture adequate volume   Culture   Final    NO GROWTH 5 DAYS Performed at [facility], [address] [place], [place] [address]    Report Status 09/17/2018 FINAL  Final  MRSA PCR Screening     Status: None   Collection Time: 09/12/18  7:04 PM  Result Value Ref Range Status   MRSA by PCR NEGATIVE NEGATIVE Final    Comment:        The GeneXpert MRSA Assay (FDA approved for NASAL specimens only), is one component of a comprehensive MRSA colonization surveillance program. It is not intended to diagnose MRSA infection nor to guide or monitor treatment for MRSA infections. Performed at [facility], [address] [place], [place] [address]   C difficile quick scan w PCR reflex     Status: None   Collection Time: 09/14/18  2:21 PM  Result Value Ref Range  Status   C Diff antigen NEGATIVE NEGATIVE Final   C Diff toxin NEGATIVE NEGATIVE Final   C Diff interpretation No C. difficile detected.  Final    Comment: Performed at [facility], [address] [place], [place] [address]  Group A Strep by PCR     Status: None   Collection Time: 09/15/18  1:53 PM  Result Value Ref Range Status   Group A Strep by PCR NOT DETECTED NOT DETECTED Final    Comment: Performed at [facility], [address] [place], [place] [address]  Body fluid culture     Status: None (Preliminary result)   Collection Time: 09/15/18  5:13 PM  Result Value Ref Range Status   Specimen Description   Final    KNEE Performed at [facility], [address] [place], [place] [address]    Special Requests   Final    NONE Performed at [facility], [address], [place], [place] [address]    Gram Stain   Final    RARE RBCS FEW WBC SEEN NO ORGANISMS SEEN Performed at [facility], [address], [place], [place] [address]    Culture   Final    NO GROWTH 3 DAYS Performed at [facility], [place] [address], [place], [place] [address]    Report Status PENDING  Incomplete  Body fluid culture     Status: None (Preliminary result)   Collection Time: 09/16/18  8:55 AM  Result Value Ref Range Status   Specimen Description KNEE RIGHT  Final   Special Requests NONE  Final   Gram Stain   Final    RARE WBC PRESENT, PREDOMINANTLY PMN NO ORGANISMS SEEN    Culture   Final    NO GROWTH 2 DAYS Performed at [facility], [place] [address], [place], [place] [address]    Report Status PENDING  Incomplete  Aerobic/Anaerobic Culture (surgical/deep wound)     Status: None (Preliminary result)   Collection Time: 09/17/18 11:32 AM  Result Value Ref Range Status   Specimen Description  Final    TISSUE LEFT KNEE 1 Performed at [facility], [address] [place], [place] [address]    Special Requests PATIENT ON  FOLLOWING MAXIPIME VANCOMYCIN  Final   Gram Stain   Final    FEW WBC PRESENT,BOTH PMN AND MONONUCLEAR NO ORGANISMS SEEN    Culture   Final    NO GROWTH < 12 HOURS Performed at [facility], [place] [address] [place], [place] [address]    Report Status PENDING  Incomplete  Aerobic/Anaerobic Culture (surgical/deep wound)     Status: None (Preliminary result)   Collection Time: 09/17/18 11:43 AM  Result Value Ref Range Status   Specimen Description   Final    TISSUE LEFT KNEE 2 Performed at [facility], [address], [place], [place] [address]    Special Requests PATIENT ON FOLLOWING MAXIPIME VANCOMYCIN  Final   Gram Stain   Final    FEW WBC PRESENT, PREDOMINANTLY PMN NO ORGANISMS SEEN    Culture   Final    NO GROWTH < 12 HOURS Performed at [facility], [place] [address], [place], [place] [address]    Report Status PENDING  Incomplete  Aerobic/Anaerobic Culture (surgical/deep wound)     Status: None (Preliminary result)   Collection Time: 09/17/18 11:44 AM  Result Value Ref Range Status   Specimen Description   Final    TISSUE LEFT KNEE 3 Performed at [facility], [address] [place], [place] [address]    Special Requests PATIENT ON FOLLOWING MAXIPIME VANCOMYCIN  Final   Gram Stain   Final    MODERATE WBC PRESENT, PREDOMINANTLY PMN NO ORGANISMS SEEN    Culture   Final    NO GROWTH < 12 HOURS Performed at [facility], [place] [address], [place], [place] [address]    Report Status PENDING  Incomplete    Coagulation Studies: Recent Labs    09/15/18 1832  LABPROT 13.8  INR 1.1    Urinalysis: No results for input(s): COLORURINE, LABSPEC, PHURINE, GLUCOSEU, HGBUR, BILIRUBINUR, KETONESUR, PROTEINUR, UROBILINOGEN, NITRITE, LEUKOCYTESUR in the last 72 hours.  Invalid input(s): APPERANCEUR    Imaging: No results found.   Medications:   . ceFEPime (MAXIPIME) IV Stopped (09/17/18 2220)  . lactated ringers 125 mL/hr at  09/18/18 1000  . norepinephrine (LEVOPHED) Adult infusion 4 mcg/min (09/18/18 0753)  . phenylephrine (NEO-SYNEPHRINE) Adult infusion Stopped (09/14/18 1305)  . vancomycin Stopped (09/17/18 1628)  . vasopressin (PITRESSIN) infusion - *FOR SHOCK* Stopped (09/15/18 1829)   . sodium chloride   Intravenous Once  . aspirin EC  81 mg Oral Daily  . Chlorhexidine Gluconate Cloth  6 each Topical Q0600  . citalopram  20 mg Oral Daily  . docusate sodium  100 mg Oral BID  . fluticasone furoate-vilanterol  1 puff Inhalation Daily  . insulin aspart  0-15 Units Subcutaneous TID WC  . insulin aspart  0-5 Units Subcutaneous QHS  . insulin glargine  12 Units Subcutaneous QHS  . loratadine  10 mg Oral Daily  . rosuvastatin  20 mg Oral Daily   acetaminophen **OR** acetaminophen, albuterol, docusate sodium, HYDROcodone-acetaminophen, HYDROcodone-acetaminophen, menthol-cetylpyridinium **OR** phenol, metoCLOPramide **OR** metoCLOPramide (REGLAN) injection, ondansetron **OR** ondansetron (ZOFRAN) IV  Assessment/ Plan:  [name] is a 58 y.o. black male with hyeprtension, COPD, diabetes mellitus type II who underwent bilateral knee replacement surgeries last week. who was admitted to [facility] on 09/12/2018 for sepsis  1. Acute renal failure with  metabolic acidosis: baseline creatinine of 1.07 with normal GFR in 06/12/18.  TMP/SMX could have been contributing to renal failure Nonoliguric urine output - Holding metformin, lisinopril and TMP/SMX Serum creatinine slightly higher today compared to yesterday Continue to monitor closely Agree with maintenance IV fluids until diet gets close to normal  2. Septic shock/hypotension Requiring vasopressors -Recent bilateral knee replacement on Sep 01, 2018 at [facility]  -Suspected left knee septic arthritis, patient underwent I&D and polyethylene exchange on 5/31 -Currently being treated with cefepime and vancomycin  3. Hyponatremia and peripheral edema: secondary to  renal failure and hypervolemic volume status.  -Level has improved to 136 today     LOS: 6 Alvaro Aungst 6/1/202010:07 AM

## 2018-09-18 NOTE — Anesthesia Preprocedure Evaluation (Signed)
Anesthesia Evaluation  Patient identified by MRN, date of birth, ID band Patient awake    Reviewed: Allergy & Precautions, H&P , NPO status , Patient's Chart, lab work & pertinent test results, reviewed documented beta blocker date and time   Airway Mallampati: II  TM Distance: >3 FB Neck ROM: full    Dental  (+) Teeth Intact   Pulmonary asthma , sleep apnea and Continuous Positive Airway Pressure Ventilation , COPD, former smoker,    Pulmonary exam normal        Cardiovascular Exercise Tolerance: Poor hypertension, On Medications + CAD  negative cardio ROS Normal cardiovascular exam Rate:Normal     Neuro/Psych PSYCHIATRIC DISORDERS Anxiety Depression negative neurological ROS     GI/Hepatic Neg liver ROS, GERD  ,  Endo/Other  negative endocrine ROSdiabetes  Renal/GU Renal disease  negative genitourinary   Musculoskeletal   Abdominal   Peds  Hematology negative hematology ROS (+)   Anesthesia Other Findings   Reproductive/Obstetrics negative OB ROS                             Anesthesia Physical Anesthesia Plan  ASA: IV and emergent  Anesthesia Plan: General LMA   Post-op Pain Management:    Induction:   PONV Risk Score and Plan:   Airway Management Planned:   Additional Equipment:   Intra-op Plan:   Post-operative Plan:   Informed Consent: I have reviewed the patients History and Physical, chart, labs and discussed the procedure including the risks, benefits and alternatives for the proposed anesthesia with the patient or authorized representative who has indicated his/her understanding and acceptance.       Plan Discussed with: CRNA  Anesthesia Plan Comments:         Anesthesia Quick Evaluation

## 2018-09-18 NOTE — Progress Notes (Signed)
CRITICAL CARE NOTE        SUBJECTIVE FINDINGS & SIGNIFICANT EVENTS   58 year old male status post bilateral knee replacement surgery with admission for septic shock. Status post left knee septic arthritis washout revision.  Discussed with orthopedics today-Dr. Harlow Mares appreciate input- will go for washout of right knee tonight at 8 PM  Patient remains critically ill on vasopressor support.   PAST MEDICAL HISTORY   Past Medical History:  Diagnosis Date  . Allergy   . Anxiety   . Arthritis    knees  . Asthma, mild intermittent, well-controlled   . Chronic obstructive asthma ([place])   . COPD (chronic obstructive pulmonary disease) ([place])   . Depression with anxiety   . Diabetes ([place])   . GERD (gastroesophageal reflux disease)   . HBP (high blood pressure)   . High cholesterol   . Renal cyst, right   . Sleep apnea    CPAP  . Swelling      SURGICAL HISTORY   Past Surgical History:  Procedure Laterality Date  . BLADDER REPAIR    . COLONOSCOPY WITH PROPOFOL N/A 04/07/2015   Procedure: COLONOSCOPY WITH PROPOFOL;  Surgeon: Lucilla Lame, MD;  Location: [facility];  Service: Endoscopy;  Laterality: N/A;  Diabetic - oral meds CPAP  . HAND SURGERY     4TH AND 5TH FINGER  . KNEE ARTHROSCOPY Right   . 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 VEIN SURGERY Right      FAMILY HISTORY   Family History  Problem Relation Age of Onset  . Hypertension Mother   . Diabetes Mother   . Diabetes Father   . Hypertension Father   . Cancer Father   . Seizures Daughter   . Lupus Daughter   . Cancer Maternal Grandfather        Prostate  . Diabetes Paternal Grandfather   . Prostate cancer Maternal Uncle   . Bladder Cancer Neg Hx   . Kidney cancer Neg Hx      SOCIAL HISTORY   Social  History   Tobacco Use  . Smoking status: Former Smoker    Packs/day: 1.00    Years: 10.00    Pack years: 10.00    Types: Cigarettes    Start date: 04/20/1995    Last attempt to quit: 04/19/2005    Years since quitting: 13.4  . Smokeless tobacco: Never Used  Substance Use Topics  . Alcohol use: Yes    Alcohol/week: 10.0 standard drinks    Types: 10 Cans of beer per week    Comment: occasional  . Drug use: No     MEDICATIONS   Current Medication:  Current Facility-Administered Medications:  .  0.9 %  sodium chloride infusion (Manually program via Guardrails IV Fluids), , Intravenous, Once, Lovell Sheehan, MD, Stopped at 09/15/18 [phone] .  acetaminophen (TYLENOL) tablet 650 mg, 650 mg, Oral, Q6H PRN, 650 mg at 09/14/18 1527 **OR** acetaminophen (TYLENOL) suppository 650 mg, 650 mg, Rectal, Q6H PRN, Lovell Sheehan, MD, 650 mg at 09/17/18 0321 .  albuterol (PROVENTIL) (2.5 MG/3ML) 0.083% nebulizer solution 2.5 mg, 2.5 mg, Nebulization, Q6H PRN, Lovell Sheehan, MD .  aspirin EC tablet 81 mg, 81 mg, Oral, Daily, Lovell Sheehan, MD, 81 mg at 09/17/18 1497 .  ceFEPIme (MAXIPIME) 2 g in sodium chloride 0.9 % 100 mL IVPB, 2 g, Intravenous, Q12H, Lovell Sheehan, MD, Stopped at 09/17/18 2220 .  Chlorhexidine Gluconate Cloth 2 % PADS 6  each, 6 each, Topical, Q0600, Lovell Sheehan, MD, 6 each at 09/18/18 [phone] .  citalopram (CELEXA) tablet 20 mg, 20 mg, Oral, Daily, Lovell Sheehan, MD, 20 mg at 09/17/18 4193 .  docusate sodium (COLACE) capsule 100 mg, 100 mg, Oral, BID PRN, Lovell Sheehan, MD .  docusate sodium (COLACE) capsule 100 mg, 100 mg, Oral, BID, Lovell Sheehan, MD, 100 mg at 09/17/18 2142 .  fluticasone furoate-vilanterol (BREO ELLIPTA) 100-25 MCG/INH 1 puff, 1 puff, Inhalation, Daily, Lovell Sheehan, MD, 1 puff at 09/17/18 [phone] .  HYDROcodone-acetaminophen (NORCO/VICODIN) 5-325 MG per tablet 1-2 tablet, 1-2 tablet, Oral, Q4H PRN, Lovell Sheehan, MD, 1 tablet at 09/17/18 1612 .   HYDROcodone-acetaminophen (NORCO/VICODIN) 5-325 MG per tablet 2 tablet, 2 tablet, Oral, Q4H PRN, Lovell Sheehan, MD, 2 tablet at 09/18/18 0645 .  insulin aspart (novoLOG) injection 0-15 Units, 0-15 Units, Subcutaneous, TID WC, Lovell Sheehan, MD, 3 Units at 09/17/18 1636 .  insulin aspart (novoLOG) injection 0-5 Units, 0-5 Units, Subcutaneous, QHS, Lovell Sheehan, MD, 2 Units at 09/16/18 2147 .  insulin glargine (LANTUS) injection 12 Units, 12 Units, Subcutaneous, QHS, Lovell Sheehan, MD, 12 Units at 09/17/18 2143 .  loratadine (CLARITIN) tablet 10 mg, 10 mg, Oral, Daily, Lovell Sheehan, MD, 10 mg at 09/17/18 0945 .  menthol-cetylpyridinium (CEPACOL) lozenge 3 mg, 1 lozenge, Oral, PRN **OR** phenol (CHLORASEPTIC) mouth spray 1 spray, 1 spray, Mouth/Throat, PRN, Lovell Sheehan, MD .  metoCLOPramide (REGLAN) tablet 5-10 mg, 5-10 mg, Oral, Q8H PRN **OR** metoCLOPramide (REGLAN) injection 5-10 mg, 5-10 mg, Intravenous, Q8H PRN, Lovell Sheehan, MD .  norepinephrine (LEVOPHED) 16 mg in 240mL premix infusion, 0-40 mcg/min, Intravenous, Titrated, Lovell Sheehan, MD, Last Rate: 5.63 mL/hr at 09/18/18 0600, 6 mcg/min at 09/18/18 0600 .  ondansetron (ZOFRAN) tablet 4 mg, 4 mg, Oral, Q6H PRN **OR** ondansetron (ZOFRAN) injection 4 mg, 4 mg, Intravenous, Q6H PRN, Lovell Sheehan, MD .  phenylephrine (NEO-SYNEPHRINE) 10 mg in sodium chloride 0.9 % 250 mL (0.04 mg/mL) infusion, 0-400 mcg/min, Intravenous, Titrated, Lovell Sheehan, MD, Stopped at 09/14/18 1305 .  rosuvastatin (CRESTOR) tablet 20 mg, 20 mg, Oral, Daily, Lovell Sheehan, MD, 20 mg at 09/17/18 0940 .  vancomycin (VANCOCIN) IVPB 1000 mg/200 mL premix, 1,000 mg, Intravenous, Q24H, Lovell Sheehan, MD, Stopped at 09/17/18 1628 .  vasopressin (PITRESSIN) 40 Units in sodium chloride 0.9 % 250 mL (0.16 Units/mL) infusion, 0.03 Units/min, Intravenous, Continuous, Lovell Sheehan, MD, Stopped at 09/15/18 1829    ALLERGIES   Lipitor [atorvastatin];  Other; and Viagra  [sildenafil citrate]    REVIEW OF SYSTEMS    Point ROS conducted is negative except for severe knee pain  PHYSICAL EXAMINATION   Vitals:   09/18/18 0600 09/18/18 0630  BP: 118/75 101/83  Pulse: (!) 114 (!) 115  Resp: (!) 24 (!) 25  Temp:    SpO2: 99% 100%    GENERAL: Mild distress due to pain HEAD: Normocephalic, atraumatic.  EYES: Pupils equal, round, reactive to light.  No scleral icterus.  MOUTH: Moist mucosal membrane. NECK: Supple. No thyromegaly. No nodules. No JVD.  PULMONARY:  mild crackles at the bases CARDIOVASCULAR: S1 and S2. Regular rate and rhythm. No murmurs, rubs, or gallops.  GASTROINTESTINAL: Soft, nontender, non-distended. No masses. Positive bowel sounds. No hepatosplenomegaly.  MUSCULOSKELETAL: No swelling, clubbing, or edema.  NEUROLOGIC: Mild distress due to acute illness SKIN:intact,warm,dry   LABS AND IMAGING   LAB RESULTS:  Recent Labs  Lab 09/16/18 0459 09/17/18 0543 09/18/18 0517  NA 133* 135 136  K 3.5 3.4* 3.7  CL 96* 98 102  CO2 23 22 21*  BUN 20 19 20   CREATININE 2.20* 2.46* 2.60*  GLUCOSE 197* 201* 186*   Recent Labs  Lab 09/15/18 0436  09/15/18 2255 09/16/18 0459 09/17/18 0543  HGB 7.5*   < > 9.4* 9.6* 9.5*  HCT 23.2*   < > 28.4* 29.1* 29.3*  WBC 5.5  --   --  6.8 7.9  PLT 158  --   --  193 227   < > = values in this interval not displayed.     IMAGING RESULTS: No results found.    ASSESSMENT AND PLAN     -Multidisciplinary rounds held today   Septic shock - improved -Possibly due to urinary tract infection with severe AKI stage IV -concern forpossiblePJI of bilateral knees as they are hot to touch, swollen/tender, patient with fevers, chills hypotension s Leukocytosis improved on antibiotics -s/p Left knee washout of septic arthritis, going for right knee washout today 8pm   Acute blood loss anemia -status post PRBC transfusion -No obvious source of grossly appreciable  bleeding -3 BMs yesteday - will send for occult blood next BM -Monitor H&H - will consider DIC - fibrinogenis adequate - pathology slide consultation-peripheral blood film normal    Acute kidney injury stage IV -DC nonessential nephrotoxic meds -Nephrology consultation- GFR improved overnight -Status post IV fluid rehydration-net positive 8 L -Developing crackles at the bases- cxr pending   Diabetes mellitus with uncontrolled hyperglycemia -On insulin sliding scale AC at bedtime -Starting nightly long-acting Lantus 8 units subcu for now    ID -continue IV abx as prescibed -follow up cultures  GI/Nutrition GI PROPHYLAXIS as indicated DIET-->TF's as tolerated Constipation protocol as indicated  ENDO - ICU hypoglycemic\Hyperglycemia protocol -check FSBS per protocol   ELECTROLYTES -follow labs as needed -replace as needed -pharmacy consultation   DVT/GI PRX ordered -SCDs  TRANSFUSIONS AS NEEDED MONITOR FSBS ASSESS the need for LABS as needed   Critical care provider statement:  Critical care time (minutes):33 Critical care time was exclusive of: Separately billable procedures and treating other patients Critical care was necessary to treat or prevent imminent or life-threatening deterioration of the following conditions: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 I assumed direction of critical care for this patient from another provider in my specialty: no     Ottie Glazier, M.D.   Division of [facility]

## 2018-09-18 NOTE — Progress Notes (Addendum)
  Subjective:  Patient reports pain as moderate.  Moderate pain in the left knee. Little to no pain in the right knee  Objective:   VITALS:   Vitals:   09/18/18 0500 09/18/18 0530 09/18/18 0600 09/18/18 0630  BP: 116/88  118/75 101/83  Pulse: (!) 108 (!) 115 (!) 114 (!) 115  Resp: (!) 24 17 (!) 24 (!) 25  Temp:      TempSrc:      SpO2: 98% 94% 99% 100%  Weight:      Height:        PHYSICAL EXAM:  Neurologically intact ABD soft Neurovascular intact Sensation intact distally Intact pulses distally Dorsiflexion/Plantar flexion intact Incision: no drainage No cellulitis present Compartment soft  LABS  Results for orders placed or performed during the hospital encounter of 09/12/18 (from the past 24 hour(s))  Glucose, capillary     Status: Abnormal   Collection Time: 09/17/18  4:10 PM  Result Value Ref Range   Glucose-Capillary 184 (H) 70 - 99 mg/dL  Glucose, capillary     Status: Abnormal   Collection Time: 09/17/18  9:16 PM  Result Value Ref Range   Glucose-Capillary 187 (H) 70 - 99 mg/dL  Magnesium     Status: None   Collection Time: 09/18/18  5:17 AM  Result Value Ref Range   Magnesium 1.7 1.7 - 2.4 mg/dL  Basic metabolic panel     Status: Abnormal   Collection Time: 09/18/18  5:17 AM  Result Value Ref Range   Sodium 136 135 - 145 mmol/L   Potassium 3.7 3.5 - 5.1 mmol/L   Chloride 102 98 - 111 mmol/L   CO2 21 (L) 22 - 32 mmol/L   Glucose, Bld 186 (H) 70 - 99 mg/dL   BUN 20 6 - 20 mg/dL   Creatinine, Ser 2.60 (H) 0.61 - 1.24 mg/dL   Calcium 8.8 (L) 8.9 - 10.3 mg/dL   GFR calc non Af Amer 26 (L) >60 mL/min   GFR calc Af Amer 30 (L) >60 mL/min   Anion gap 13 5 - 15  Procalcitonin - Baseline     Status: None   Collection Time: 09/18/18  5:17 AM  Result Value Ref Range   Procalcitonin 1.25 ng/mL  Glucose, capillary     Status: Abnormal   Collection Time: 09/18/18  7:45 AM  Result Value Ref Range   Glucose-Capillary 181 (H) 70 - 99 mg/dL  Vancomycin,  random     Status: None   Collection Time: 09/18/18 10:19 AM  Result Value Ref Range   Vancomycin Rm 11   Glucose, capillary     Status: Abnormal   Collection Time: 09/18/18 11:59 AM  Result Value Ref Range   Glucose-Capillary 179 (H) 70 - 99 mg/dL    No results found.  Assessment/Plan: 1 Day Post-Op   Active Problems:   Sepsis ([place])   Advance diet Up with therapy  Recheck tomorrow bilateral knees   Carlynn Spry , PA-C 09/18/2018, 3:00 PM   Discussed case further with care team and given his poor progress today, recommend right knee irrigation and debridement with polyethylene exchange. Discussed the risks, benefits and alternatives with his wife and agree to proceed.

## 2018-09-18 NOTE — Anesthesia Post-op Follow-up Note (Signed)
Anesthesia QCDR form completed.        

## 2018-09-18 NOTE — Consult Note (Signed)
PHARMACY CONSULT NOTE - FOLLOW UP  Pharmacy Consult for Electrolyte Monitoring and Replacement   Recent Labs: Potassium (mmol/L)  Date Value  09/18/2018 3.7   Magnesium (mg/dL)  Date Value  09/18/2018 1.7   Calcium (mg/dL)  Date Value  09/18/2018 8.8 (L)   Albumin (g/dL)  Date Value  09/16/2018 2.1 (L)  06/17/2015 4.7   Phosphorus (mg/dL)  Date Value  09/17/2018 4.5   Sodium (mmol/L)  Date Value  09/18/2018 136  06/17/2015 137   Assessment: 58 y.o.malewith a history of arthritis, COPD not on home oxygen, hypertension, sleep apnea, non-insulin-dependent diabetes mellitus presents to hospital secondary to dizziness, weakness and a fall  Plan:  --IV magnesium sulfate 2 grams to keep magnesium ~2 --oral KCl 40 mEq x1 to keep potassium ~ 4.0 --F/U labs in am and replace as needed  Dallie Piles ,PharmD Clinical Pharmacist 09/18/2018 12:59 PM

## 2018-09-18 NOTE — Progress Notes (Signed)
Pharmacy Antibiotic Note  Isaac Cross is a 58 y.o. male admitted on 09/12/2018 with septic arthritis.  Pharmacy has been consulted for vancomycin and cefepime dosing. Patient s/p bilateral TKR 09/01/2018 CTX x 1 and Cipro x 1 in ER. His SCr remains elevated above his baseline of 1.07 mg/dL but is trending down. Status post left knee washout yesterday.  Follow-up cultures  Plan: 1) increase vancomycin to 1250 mg IV Q 24 hrs. Goal AUC 400-550. Expected AUC: 457 SCr used: 2.60       BMI 38     2) continue cefepime 2g IV q12h  Height: 6\' 4"  (193 cm) Weight: (!) 320 lb (145.2 kg) IBW/kg (Calculated) : 86.8  Temp (24hrs), Avg:99.3 F (37.4 C), Min:98.4 F (36.9 C), Max:100.3 F (37.9 C)  Recent Labs  Lab 09/12/18 0955 09/12/18 1301 09/12/18 1823 09/12/18 2234 09/13/18 0344 09/14/18 0336 09/15/18 0436 09/16/18 0459 09/16/18 1610 09/17/18 0543 09/18/18 0517 09/18/18 1019  WBC 16.1*  --   --   --  9.4 5.9 5.5 6.8  --  7.9  --   --   CREATININE 3.71*  --   --   --  4.31* 3.50* 2.82* 2.20*  --  2.46* 2.60*  --   LATICACIDVEN 4.9* 2.0* 1.4 1.2  --   --   --   --   --   --   --   --   VANCOPEAK  --   --   --   --   --   --   --   --  22*  --   --   --   VANCORANDOM  --   --   --   --   --   --   --   --   --   --   --  11    Estimated Creatinine Clearance: 47.1 mL/min (A) (by C-G formula based on SCr of 2.6 mg/dL (H)).    Antimicrobials this admission: cipro 5/26 >> 5/26 CTX 5/26 >>  5/26 Vanc 5/26 >> Cefepime 5/26 >>  Dose adjustments this admission: 6/1 vancomycin 1000 mg q24h--->1250 mg q24h   Microbiology results: 6/1 WCx x3: pending 5/26 MRSA PCR: neg 5/26 BCx: NG 5/26 UCx: NG  Thank you for allowing pharmacy to be a part of this patient's care.  Vallery Sa, PharmD 09/18/2018 12:45 PM

## 2018-09-18 NOTE — Progress Notes (Signed)
   Date of Admission:  09/12/2018      Subjective: Confused, in pain  Medications:  . sodium chloride   Intravenous Once  . aspirin EC  81 mg Oral Daily  . Chlorhexidine Gluconate Cloth  6 each Topical Q0600  . citalopram  20 mg Oral Daily  . docusate sodium  100 mg Oral BID  . fluticasone furoate-vilanterol  1 puff Inhalation Daily  . insulin aspart  0-15 Units Subcutaneous TID WC  . insulin aspart  0-5 Units Subcutaneous QHS  . insulin glargine  12 Units Subcutaneous QHS  . loratadine  10 mg Oral Daily  . potassium chloride  40 mEq Oral Once  . rosuvastatin  20 mg Oral Daily    Objective: Vital signs in last 24 hours: Temp:  [98.4 F (36.9 C)-100.3 F (37.9 C)] 100.3 F (37.9 C) (06/01 0215) Pulse Rate:  [97-123] 115 (06/01 0630) Resp:  [17-30] 25 (06/01 0630) BP: (93-120)/(67-102) 101/83 (06/01 0630) SpO2:  [88 %-100 %] 100 % (06/01 0630) Arterial Line BP: (78-133)/(46-88) 78/72 (06/01 0215)  PHYSICAL EXAM:  General: Awake but confused tachycardic Lungs: Bilateral air entry decreased in bases Heart: Tachycardia abdomen: Soft, non-tender,not distended. Bowel sounds normal. No masses Extremities: Left knee had IND and now has a healed surgical dressing Right knee is swollen  skin: No rashes or lesions. Or bruising Lymph: Cervical, supraclavicular normal. Neurologic: Grossly non-focal  Lab Results Recent Labs    09/16/18 0459 09/17/18 0543 09/18/18 0517  WBC 6.8 7.9  --   HGB 9.6* 9.5*  --   HCT 29.1* 29.3*  --   NA 133* 135 136  K 3.5 3.4* 3.7  CL 96* 98 102  CO2 23 22 21*  BUN 20 19 20   CREATININE 2.20* 2.46* 2.60*   Liver Panel Recent Labs    09/16/18 0459  PROT 6.2*  ALBUMIN 2.1*  AST 143*  ALT 59*  ALKPHOS 75  BILITOT 0.8     Microbiology: 5/31- Knee tissue culture P 5/30 synovial fluid rt knee 09/15/18 synovial fluid left knee 09/12/18 BC- NG  Studies/Results: Ct knees 09/15/18 Moderate-sized knee joint effusion extending into  the suprapatellar bursa. Moderate size Baker's cyst Assessment/Plan: Feverwith  Septic shock in a patient who recently had bilateral knee replacement.  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  Currently on Vanco and cefepime.  Because of ongoing fever, tachycardia right knee will have washout as well  AKI could be multifactorial in etiology. Infection  versus medication.  .Seen by nephrologist. Metformin lisinopril and Bactrim have been stopped. ?ultrasound of the kidneys no hydronephrosis.   Hyponatremia - corrected  Bilateral knee joint replacement on 09/01/2018.    Diabetes mellitus was on metformin. Now on insulin.  Discussed the management with care team

## 2018-09-18 NOTE — Anesthesia Postprocedure Evaluation (Signed)
Anesthesia Post Note  Patient: Isaac Cross  Procedure(s) Performed: I &D left knee and polyethylene exchange (Left )  Patient location during evaluation: Other Anesthesia Type: General Level of consciousness: awake and alert Pain management: pain level controlled Vital Signs Assessment: post-procedure vital signs reviewed and stable Respiratory status: spontaneous breathing, nonlabored ventilation, respiratory function stable and patient connected to nasal cannula oxygen Cardiovascular status: blood pressure returned to baseline and stable Postop Assessment: no apparent nausea or vomiting Anesthetic complications: no     Last Vitals:  Vitals:   09/18/18 0600 09/18/18 0630  BP: 118/75 101/83  Pulse: (!) 114 (!) 115  Resp: (!) 24 (!) 25  Temp:    SpO2: 99% 100%    Last Pain:  Vitals:   09/18/18 0645  TempSrc:   PainSc: 7                  Alison Stalling

## 2018-09-19 ENCOUNTER — Encounter: Payer: Self-pay | Admitting: Orthopedic Surgery

## 2018-09-19 DIAGNOSIS — E871 Hypo-osmolality and hyponatremia: Secondary | ICD-10-CM

## 2018-09-19 DIAGNOSIS — A419 Sepsis, unspecified organism: Secondary | ICD-10-CM

## 2018-09-19 DIAGNOSIS — N179 Acute kidney failure, unspecified: Secondary | ICD-10-CM

## 2018-09-19 DIAGNOSIS — R6521 Severe sepsis with septic shock: Secondary | ICD-10-CM

## 2018-09-19 DIAGNOSIS — G9340 Encephalopathy, unspecified: Secondary | ICD-10-CM

## 2018-09-19 DIAGNOSIS — T8453XA Infection and inflammatory reaction due to internal right knee prosthesis, initial encounter: Secondary | ICD-10-CM

## 2018-09-19 LAB — CBC WITH DIFFERENTIAL/PLATELET
Abs Immature Granulocytes: 0.16 10*3/uL — ABNORMAL HIGH (ref 0.00–0.07)
Basophils Absolute: 0.1 10*3/uL (ref 0.0–0.1)
Basophils Relative: 0 %
Eosinophils Absolute: 0.5 10*3/uL (ref 0.0–0.5)
Eosinophils Relative: 4 %
HCT: 28.4 % — ABNORMAL LOW (ref 39.0–52.0)
Hemoglobin: 9 g/dL — ABNORMAL LOW (ref 13.0–17.0)
Immature Granulocytes: 1 %
Lymphocytes Relative: 24 %
Lymphs Abs: 2.7 10*3/uL (ref 0.7–4.0)
MCH: 27 pg (ref 26.0–34.0)
MCHC: 31.7 g/dL (ref 30.0–36.0)
MCV: 85.3 fL (ref 80.0–100.0)
Monocytes Absolute: 1.5 10*3/uL — ABNORMAL HIGH (ref 0.1–1.0)
Monocytes Relative: 13 %
Neutro Abs: 6.4 10*3/uL (ref 1.7–7.7)
Neutrophils Relative %: 58 %
Platelets: 269 10*3/uL (ref 150–400)
RBC: 3.33 MIL/uL — ABNORMAL LOW (ref 4.22–5.81)
RDW: 15.3 % (ref 11.5–15.5)
WBC: 11.2 10*3/uL — ABNORMAL HIGH (ref 4.0–10.5)
nRBC: 0 % (ref 0.0–0.2)

## 2018-09-19 LAB — URINALYSIS, ROUTINE W REFLEX MICROSCOPIC
Bacteria, UA: NONE SEEN
Bilirubin Urine: NEGATIVE
Glucose, UA: NEGATIVE mg/dL
Ketones, ur: 5 mg/dL — AB
Leukocytes,Ua: NEGATIVE
Nitrite: NEGATIVE
Protein, ur: 100 mg/dL — AB
Specific Gravity, Urine: 1.018 (ref 1.005–1.030)
Squamous Epithelial / HPF: NONE SEEN (ref 0–5)
pH: 5 (ref 5.0–8.0)

## 2018-09-19 LAB — COMPREHENSIVE METABOLIC PANEL
ALT: 43 U/L (ref 0–44)
AST: 70 U/L — ABNORMAL HIGH (ref 15–41)
Albumin: 1.9 g/dL — ABNORMAL LOW (ref 3.5–5.0)
Alkaline Phosphatase: 71 U/L (ref 38–126)
Anion gap: 11 (ref 5–15)
BUN: 22 mg/dL — ABNORMAL HIGH (ref 6–20)
CO2: 22 mmol/L (ref 22–32)
Calcium: 8.2 mg/dL — ABNORMAL LOW (ref 8.9–10.3)
Chloride: 101 mmol/L (ref 98–111)
Creatinine, Ser: 3.42 mg/dL — ABNORMAL HIGH (ref 0.61–1.24)
GFR calc Af Amer: 21 mL/min — ABNORMAL LOW (ref >60)
GFR calc non Af Amer: 18 mL/min — ABNORMAL LOW (ref >60)
Glucose, Bld: 167 mg/dL — ABNORMAL HIGH (ref 70–99)
Potassium: 3.8 mmol/L (ref 3.5–5.1)
Sodium: 134 mmol/L — ABNORMAL LOW (ref 135–145)
Total Bilirubin: 0.6 mg/dL (ref 0.3–1.2)
Total Protein: 6 g/dL — ABNORMAL LOW (ref 6.5–8.1)

## 2018-09-19 LAB — GLUCOSE, CAPILLARY
Glucose-Capillary: 138 mg/dL — ABNORMAL HIGH (ref 70–99)
Glucose-Capillary: 207 mg/dL — ABNORMAL HIGH (ref 70–99)
Glucose-Capillary: 151 mg/dL — ABNORMAL HIGH (ref 70–99)
Glucose-Capillary: 154 mg/dL — ABNORMAL HIGH (ref 70–99)

## 2018-09-19 LAB — BODY FLUID CULTURE: Culture: NO GROWTH

## 2018-09-19 LAB — LACTIC ACID, PLASMA: Lactic Acid, Venous: 1 mmol/L (ref 0.5–1.9)

## 2018-09-19 LAB — FERRITIN: Ferritin: 1738 ng/mL — ABNORMAL HIGH (ref 24–336)

## 2018-09-19 LAB — CK: Total CK: 716 U/L — ABNORMAL HIGH (ref 49–397)

## 2018-09-19 LAB — MAGNESIUM: Magnesium: 1.9 mg/dL (ref 1.7–2.4)

## 2018-09-19 MED ORDER — NOREPINEPHRINE 4 MG/250ML-% IV SOLN
0.0000 ug/min | INTRAVENOUS | Status: DC
  Administered 2018-09-19: 20:00:00 2 ug/min via INTRAVENOUS
  Administered 2018-09-20: 7 ug/min via INTRAVENOUS
  Filled 2018-09-19: qty 250

## 2018-09-19 MED ORDER — ADULT MULTIVITAMIN W/MINERALS CH
1.0000 | ORAL_TABLET | Freq: Every day | ORAL | Status: DC
  Administered 2018-09-23 – 2018-09-25 (×2): 1 via ORAL
  Filled 2018-09-19 (×4): qty 1

## 2018-09-19 MED ORDER — LACTATED RINGERS IV SOLN
INTRAVENOUS | Status: DC
  Administered 2018-09-19: 900 mL via INTRAVENOUS

## 2018-09-19 MED ORDER — THIAMINE HCL 100 MG/ML IJ SOLN
Freq: Once | INTRAVENOUS | Status: AC
  Administered 2018-09-19: 17:00:00 via INTRAVENOUS
  Filled 2018-09-19: qty 1000

## 2018-09-19 MED ORDER — LORAZEPAM 2 MG/ML IJ SOLN
2.0000 mg | INTRAMUSCULAR | Status: DC | PRN
  Administered 2018-09-19 (×2): 3 mg via INTRAVENOUS
  Administered 2018-09-20 – 2018-09-24 (×12): 2 mg via INTRAVENOUS
  Administered 2018-09-24: 3 mg via INTRAVENOUS
  Filled 2018-09-19: qty 1
  Filled 2018-09-19: qty 2
  Filled 2018-09-19: qty 1
  Filled 2018-09-19: qty 2
  Filled 2018-09-19: qty 1
  Filled 2018-09-19: qty 2
  Filled 2018-09-19 (×9): qty 1

## 2018-09-19 MED ORDER — ORAL CARE MOUTH RINSE
15.0000 mL | Freq: Two times a day (BID) | OROMUCOSAL | Status: DC
  Administered 2018-09-19 – 2018-10-11 (×32): 15 mL via OROMUCOSAL

## 2018-09-19 MED ORDER — DOCUSATE SODIUM 100 MG PO CAPS
100.0000 mg | ORAL_CAPSULE | Freq: Two times a day (BID) | ORAL | Status: DC
  Administered 2018-09-19: 100 mg via ORAL

## 2018-09-19 MED ORDER — MAGNESIUM SULFATE IN D5W 1-5 GM/100ML-% IV SOLN
1.0000 g | Freq: Once | INTRAVENOUS | Status: AC
  Administered 2018-09-19: 18:00:00 1 g via INTRAVENOUS
  Filled 2018-09-19: qty 100

## 2018-09-19 MED ORDER — PHENOL 1.4 % MT LIQD
1.0000 | OROMUCOSAL | Status: DC | PRN
  Filled 2018-09-19: qty 177

## 2018-09-19 MED ORDER — MIDAZOLAM HCL 2 MG/2ML IJ SOLN
2.0000 mg | Freq: Once | INTRAMUSCULAR | Status: AC
  Administered 2018-09-19: 2 mg via INTRAVENOUS
  Filled 2018-09-19: qty 2

## 2018-09-19 MED ORDER — PIPERACILLIN-TAZOBACTAM 3.375 G IVPB
3.3750 g | Freq: Three times a day (TID) | INTRAVENOUS | Status: DC
  Administered 2018-09-19 – 2018-09-21 (×5): 3.375 g via INTRAVENOUS
  Filled 2018-09-19 (×5): qty 50

## 2018-09-19 MED ORDER — METOCLOPRAMIDE HCL 10 MG PO TABS
5.0000 mg | ORAL_TABLET | Freq: Three times a day (TID) | ORAL | Status: DC | PRN
  Filled 2018-09-19: qty 1

## 2018-09-19 MED ORDER — VANCOMYCIN VARIABLE DOSE PER UNSTABLE RENAL FUNCTION (PHARMACIST DOSING): Status: DC

## 2018-09-19 MED ORDER — METOCLOPRAMIDE HCL 5 MG/ML IJ SOLN: 5.0000 mg | Freq: Three times a day (TID) | INTRAMUSCULAR | Status: DC | PRN

## 2018-09-19 MED ORDER — NOREPINEPHRINE 4 MG/250ML-% IV SOLN
INTRAVENOUS | Status: AC
  Filled 2018-09-19: qty 250

## 2018-09-19 MED ORDER — MENTHOL 3 MG MT LOZG
1.0000 | LOZENGE | OROMUCOSAL | Status: DC | PRN
  Filled 2018-09-19: qty 9

## 2018-09-19 MED ORDER — FOLIC ACID 1 MG PO TABS
1.0000 mg | ORAL_TABLET | Freq: Every day | ORAL | Status: DC
  Filled 2018-09-19: qty 1

## 2018-09-19 MED ORDER — ONDANSETRON HCL 4 MG PO TABS: 4.0000 mg | ORAL_TABLET | Freq: Four times a day (QID) | ORAL | Status: DC | PRN

## 2018-09-19 MED ORDER — VITAMIN B-1 100 MG PO TABS
100.0000 mg | ORAL_TABLET | Freq: Every day | ORAL | Status: DC
  Filled 2018-09-19: qty 1

## 2018-09-19 MED ORDER — LACTATED RINGERS IV BOLUS
1000.0000 mL | Freq: Once | INTRAVENOUS | Status: AC
  Administered 2018-09-19: 1000 mL via INTRAVENOUS

## 2018-09-19 MED ORDER — SODIUM CHLORIDE 0.9 % IV SOLN
INTRAVENOUS | Status: DC
  Administered 2018-09-19 – 2018-09-20 (×2): via INTRAVENOUS

## 2018-09-19 MED ORDER — POTASSIUM CHLORIDE 20 MEQ PO PACK
40.0000 meq | PACK | Freq: Once | ORAL | Status: AC
  Administered 2018-09-19: 40 meq via ORAL
  Filled 2018-09-19: qty 2

## 2018-09-19 MED ORDER — ONDANSETRON HCL 4 MG/2ML IJ SOLN: 4.0000 mg | Freq: Four times a day (QID) | INTRAMUSCULAR | Status: DC | PRN

## 2018-09-19 NOTE — Progress Notes (Signed)
Pharmacy Antibiotic Note  Isaac Cross is a 58 y.o. male admitted on 09/12/2018 with septic arthritis.  Pharmacy has been consulted for vancomycin and cefepime dosing. Patient s/p bilateral TKR 09/01/2018 CTX x 1 and Cipro x 1 in ER. His SCr remains elevated above his baseline of 1.07 mg/dL and now trending back up. Status post left knee washout yesterday.  Follow-up cultures  Plan: 1) vancomycin has been changed to variable dosing with a random level scheduled for tomorrow morning. The last dose was 1250 mg on 6/1 at approximately 1400  2) continue cefepime 2g IV q12h  Height: 6\' 4"  (193 cm) Weight: (!) 320 lb (145.2 kg) IBW/kg (Calculated) : 86.8  Temp (24hrs), Avg:99.8 F (37.7 C), Min:97.5 F (36.4 C), Max:103 F (39.4 C)  Recent Labs  Lab 09/12/18 1823 09/12/18 2234  09/14/18 0336 09/15/18 0436 09/16/18 0459 09/16/18 1610 09/17/18 0543 09/18/18 0517 09/18/18 1019 09/19/18 0439  WBC  --   --    < > 5.9 5.5 6.8  --  7.9  --   --  11.2*  CREATININE  --   --    < > 3.50* 2.82* 2.20*  --  2.46* 2.60*  --  3.42*  LATICACIDVEN 1.4 1.2  --   --   --   --   --   --   --   --  1.0  VANCOPEAK  --   --   --   --   --   --  22*  --   --   --   --   VANCORANDOM  --   --   --   --   --   --   --   --   --  11  --    < > = values in this interval not displayed.    Estimated Creatinine Clearance: 35.8 mL/min (A) (by C-G formula based on SCr of 3.42 mg/dL (H)).    Antimicrobials this admission: cipro 5/26 >> 5/26 CTX 5/26 >>  5/26 Vanc 5/26 >> Cefepime 5/26 >>  Dose adjustments this admission: 6/1 vancomycin 1000 mg q24h--->1250 mg q24h   Microbiology results: 6/1 WCx x3: pending 5/26 MRSA PCR: neg 5/26 BCx: NG 5/26 UCx: NG  Thank you for allowing pharmacy to be a part of this patient's care.  Vallery Sa, PharmD 09/19/2018 2:58 PM

## 2018-09-19 NOTE — Progress Notes (Signed)
[facility] NAME: Isaac Cross    MR#:  [id number]  DATE OF BIRTH:  July 07, 1960  SUBJECTIVE:  CHIEF COMPLAINT:   Chief Complaint  Patient presents with  . Dizziness   -Status post bilateral knee washouts for sepsis.  Still spiking fevers.  Confused and encephalopathic this morning. -Worsening renal function today  REVIEW OF SYSTEMS:  Review of Systems  Constitutional: Positive for fever and malaise/fatigue. Negative for chills.  HENT: Negative for ear discharge, hearing loss, nosebleeds and sinus pain.   Eyes: Negative for blurred vision and double vision.  Respiratory: Negative for cough, shortness of breath and wheezing.   Cardiovascular: Negative for chest pain and palpitations.  Gastrointestinal: Negative for abdominal pain, constipation, diarrhea, nausea and vomiting.  Genitourinary: Negative for dysuria.  Musculoskeletal: Positive for joint pain and myalgias.  Neurological: Negative for dizziness, focal weakness, seizures, weakness and headaches.  Psychiatric/Behavioral: Negative for depression.       Confused    DRUG ALLERGIES:   Allergies  Allergen Reactions  . Lipitor [Atorvastatin] Hives and Itching  . Other   . Viagra  [Sildenafil Citrate]     vision loss    VITALS:  Blood pressure (!) 105/47, pulse (!) 127, temperature (!) 103 F (39.4 C), temperature source Oral, resp. rate 16, height 6\' 4"  (1.93 m), weight (!) 145.2 kg, SpO2 99 %.  PHYSICAL EXAMINATION:  Physical Exam   GENERAL:  58 y.o.-year-old patient lying in the bed with no acute distress.  EYES: Pupils equal, round, reactive to light and accommodation. No scleral icterus. Extraocular muscles intact.  HEENT: Head atraumatic, normocephalic. Oropharynx and nasopharynx clear.  NECK:  Supple, no jugular venous distention. No thyroid enlargement, no tenderness.  LUNGS: Normal breath sounds bilaterally, no wheezing, rales,rhonchi or crepitation. No  use of accessory muscles of respiration.  Decreased bibasilar breath sounds CARDIOVASCULAR: S1, S2 normal. No murmurs, rubs, or gallops.  ABDOMEN: Soft, nontender, nondistended. Bowel sounds present. No organomegaly or mass.  EXTREMITIES: Swelling of both knees noted with recent surgery, both knees in ice packing.  No pedal edema, cyanosis, or clubbing.  Sign myoclonic jerks noted NEUROLOGIC: Cranial nerves II through XII are intact. Muscle strength 5/5 in all extremities. Sensation intact. Gait not checked.  Global weakness noted PSYCHIATRIC: The patient is alert and oriented x2.  Intermittent confusion noted SKIN: No obvious rash, lesion, or ulcer.    LABORATORY PANEL:   CBC Recent Labs  Lab 09/19/18 0439  WBC 11.2*  HGB 9.0*  HCT 28.4*  PLT 269   ------------------------------------------------------------------------------------------------------------------  Chemistries  Recent Labs  Lab 09/19/18 0439  NA 134*  K 3.8  CL 101  CO2 22  GLUCOSE 167*  BUN 22*  CREATININE 3.42*  CALCIUM 8.2*  MG 1.9  AST 70*  ALT 43  ALKPHOS 71  BILITOT 0.6   ------------------------------------------------------------------------------------------------------------------  Cardiac Enzymes No results for input(s): TROPONINI in the last 168 hours. ------------------------------------------------------------------------------------------------------------------  RADIOLOGY:  No results found.  EKG:   Orders placed or performed during the hospital encounter of 09/12/18  . EKG 12-Lead  . EKG 12-Lead    ASSESSMENT AND PLAN:    EdwardGradyis a58 y.o.malewith a known history of arthritis, COPD not on home oxygen, hypertension, sleep apnea, non-insulin-dependent diabetes mellitus presents to hospital secondary to dizziness, weakness and a fall  1.Sepsis-with ongoing fevers.  Concern for left knee septic joint- given recent bilateral knee replacement surgery. -CT of both knees  showing moderate to  large effusions.  Status post left knee arthrocentesis and fluid showing WBCs about 10,000.  Right knee has been tapped with WBC of only 2000.  Gram stain is negative so far. -Appreciate Ortho and ID consults. -Blood cultures negative - urine cultures negative -on antibiotics with vancomycin and cefepime at this time -Status post bilateral knee washouts with still spiking fevers.  2. Acute renal failure-likely ATN from sepsis and recent use of Bactrim -Worsening creatinine, appreciate nephrology input. -Patient getting more encephalopathic.  Would consider dialysis -Urine output slowly decreasing -Hold lisinopril and metformin and other nephrotoxins.  3.  Acute encephalopathy-secondary to metabolic causes, sepsis and uremia.  Monitor closely.  No focal deficits  4. Hypertension-hold lisinopril.  5. Diabetes mellitus- sliding scale insulin.  -Hold metformin given worsening renal failure.  6. DVT prophylaxis- SCD  Physical therapy consult needed Patient is critically ill   All the records are reviewed and case discussed with Care Management/Social Workerr. Management plans discussed with the patient, family and they are in agreement.  CODE STATUS: Full code  TOTAL TIME TAKING CARE OF THIS PATIENT: 38 minutes.   POSSIBLE D/C IN 3-4 DAYS, DEPENDING ON CLINICAL CONDITION.   Isaac Cross M.D on 09/19/2018 at 10:59 AM  Between 7am to 6pm - Pager - [phone]  After 6pm go to [email or web address] - password [email or web address] [facility]  Office  [phone]  CC: Primary care physician; Steele Sizer, MD

## 2018-09-19 NOTE — Progress Notes (Signed)
Notified NP Darlyn Chamber that pt was started on peripheral levophed for low BP.  Currently on 2 mcg.

## 2018-09-19 NOTE — Progress Notes (Signed)
Date of Admission:  09/12/2018      Subjective: Sedated Found out today by ICU team   that he drinks a fifth every day and is in DT.so he is getting ativan by CIWA protocol Today creatinine worse as well Urine past 24 hrs 250 Fever of 103 today after 36 hrs of near normal fever   Medications:  . sodium chloride   Intravenous Once  . aspirin EC  81 mg Oral Daily  . Chlorhexidine Gluconate Cloth  6 each Topical Q0600  . citalopram  20 mg Oral Daily  . docusate sodium  100 mg Oral BID  . fluticasone furoate-vilanterol  1 puff Inhalation Daily  . [START ON 09/23/3417] folic acid  1 mg Oral Daily  . insulin aspart  0-15 Units Subcutaneous TID WC  . insulin aspart  0-5 Units Subcutaneous QHS  . insulin glargine  12 Units Subcutaneous QHS  . loratadine  10 mg Oral Daily  . [START ON 09/20/2018] multivitamin with minerals  1 tablet Oral Daily  . rosuvastatin  20 mg Oral Daily  . [START ON 09/20/2018] thiamine  100 mg Oral Daily  . tranexamic acid (CYKLOKAPRON) topical -INTRAOP  2,000 mg Topical Once    Objective: Vital signs in last 24 hours: Temp:  [97.5 F (36.4 C)-103 F (39.4 C)] 98.3 F (36.8 C) (06/02 1730) Pulse Rate:  [112-133] 131 (06/02 1800) Resp:  [13-31] 29 (06/02 1800) BP: (65-156)/(45-132) 119/82 (06/02 1730) SpO2:  [90 %-100 %] 100 % (06/02 1800)  PHYSICAL EXAM:  General: sedated Lungs: b/l air entry Heart:tachycardia Abdomen: Soft, non-tender,not distended. Bowel sounds normal. No masses Foley in place Extremities:b/l edema legs Skin: No rashes or lesions. Or bruising  Neurologic: sedated Lab Results Recent Labs    09/17/18 0543 09/18/18 0517 09/19/18 0439  WBC 7.9  --  11.2*  HGB 9.5*  --  9.0*  HCT 29.3*  --  28.4*  NA 135 136 134*  K 3.4* 3.7 3.8  CL 98 102 101  CO2 22 21* 22  BUN 19 20 22*  CREATININE 2.46* 2.60* 3.42*   Liver Panel Recent Labs    09/19/18 0439  PROT 6.0*  ALBUMIN 1.9*  AST 70*  ALT 43  ALKPHOS 71  BILITOT 0.6   Microbiology:  Studies/Results: No results found.   Assessment/Plan: Feverwith  Septic shock - had B/l knee washout for PJI -All cultures negative Fever near normal for 36 hrs and increased to 103 today Repeat blood culture , urine analysis  Encephalopathy -due to Delirium tremens Will dc cefepime in order to avoid neurotoxicity especially with AKI and change tozosyn- will also decide on changing vanco..   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    Diabetes mellitus was on metformin. Now on insulin.  Discussed the management with care team Discussed with hs wife

## 2018-09-19 NOTE — Progress Notes (Signed)
Pharmacy Antibiotic Note  Isaac Cross is a 58 y.o. male admitted on 09/12/2018 with septic arthritis.  Pharmacy has been consulted for vancomycin and cefepime dosing. Patient s/p bilateral TKR 09/01/2018 CTX x 1 and Cipro x 1 in ER. His SCr remains elevated above his baseline of 1.07 mg/dL and now trending back up. Status post left knee washout yesterday.  Follow-up cultures  Plan: 1) vancomycin has been changed to variable dosing with a random level scheduled for tomorrow morning. The last dose was 1250 mg on 6/1 at approximately 1400  2) continue cefepime 2g IV q12h  ADDENDUM 6/2 After discussion with ID, will stop cefepime for concern for neurotoxicity in setting of worsening renal fx.  Stop vancomycin and start zosyn.  Plan to add additional MRSA coverage pending result of random vanco level in am - start zosyn 3.375gm IV q8h over 4h infusion - continue vancomycin random level for am to help determine need to start new anti-MRSA therapy (likely daptomycin) - follow renal function  Height: 6\' 4"  (193 cm) Weight: (!) 320 lb (145.2 kg) IBW/kg (Calculated) : 86.8  Temp (24hrs), Avg:99.9 F (37.7 C), Min:97.5 F (36.4 C), Max:103 F (39.4 C)  Recent Labs  Lab 09/12/18 1823 09/12/18 2234  09/14/18 0336 09/15/18 0436 09/16/18 0459 09/16/18 1610 09/17/18 0543 09/18/18 0517 09/18/18 1019 09/19/18 0439  WBC  --   --    < > 5.9 5.5 6.8  --  7.9  --   --  11.2*  CREATININE  --   --    < > 3.50* 2.82* 2.20*  --  2.46* 2.60*  --  3.42*  LATICACIDVEN 1.4 1.2  --   --   --   --   --   --   --   --  1.0  VANCOPEAK  --   --   --   --   --   --  22*  --   --   --   --   VANCORANDOM  --   --   --   --   --   --   --   --   --  11  --    < > = values in this interval not displayed.    Estimated Creatinine Clearance: 35.8 mL/min (A) (by C-G formula based on SCr of 3.42 mg/dL (H)).    Antimicrobials this admission: cipro 5/26 >> 5/26 CTX 5/26 >>  5/26 Vanc 5/26 >> Cefepime 5/26  >>  Dose adjustments this admission: 6/1 vancomycin 1000 mg q24h--->1250 mg q24h   Microbiology results: 6/1 WCx x3: pending 5/26 MRSA PCR: neg 5/26 BCx: NG 5/26 UCx: NG  Thank you for allowing pharmacy to be a part of this patient's care.  Doreene Eland, PharmD, BCPS.   Work Cell: [phone] 09/19/2018 4:25 PM

## 2018-09-19 NOTE — Progress Notes (Signed)
Notified MD that pt states he drinks "a fifth of beer every day".  MD ordered for CIWA protocol.

## 2018-09-19 NOTE — Progress Notes (Signed)
Notified Dr Mortimer Fries of pts tremulousness and restlessness, unable to get accurate blood pressure, pt states he is not in pain.  MD ordered 2 mg versed.

## 2018-09-19 NOTE — Consult Note (Signed)
PHARMACY CONSULT NOTE - FOLLOW UP  Pharmacy Consult for Electrolyte Monitoring and Replacement   Recent Labs: Potassium (mmol/L)  Date Value  09/19/2018 3.8   Magnesium (mg/dL)  Date Value  09/19/2018 1.9   Calcium (mg/dL)  Date Value  09/19/2018 8.2 (L)   Albumin (g/dL)  Date Value  09/19/2018 1.9 (L)  06/17/2015 4.7   Phosphorus (mg/dL)  Date Value  09/17/2018 4.5   Sodium (mmol/L)  Date Value  09/19/2018 134 (L)  06/17/2015 137   Assessment: 58 y.o.malewith a history of arthritis, COPD not on home oxygen, hypertension, sleep apnea, non-insulin-dependent diabetes mellitus presents to hospital secondary to dizziness, weakness and a fall  Plan:  --IV magnesium sulfate 1 gram to keep magnesium ~2 --oral KCl 40 mEq x1 to keep potassium ~ 4.0 --F/U labs in am and replace as needed  Dallie Piles ,PharmD Clinical Pharmacist 09/19/2018 2:53 PM

## 2018-09-19 NOTE — Progress Notes (Signed)
CRITICAL CARE NOTE  CC  follow up sepsis  SUBJECTIVE Patient remains critically ill Prognosis is guarded Now with severe DT's Sepsis and fevers CIWA started Wife updated   BP (!) 131/100   Pulse (!) 129   Temp 98.3 F (36.8 C) (Oral)   Resp (!) 36   Ht 6\' 4"  (1.93 m)   Wt (!) 145.2 kg   SpO2 96%   BMI 38.95 kg/m    I/O last 3 completed shifts: In: 3273 [P.O.:720; I.V.:1052.9; IV Piggyback:1500.2] Out: 560 [Urine:560] No intake/output data recorded.  SpO2: 96 % O2 Flow Rate (L/min): 6 L/min     REVIEW OF SYSTEMS  PATIENT IS UNABLE TO PROVIDE COMPLETE REVIEW OF SYSTEMS DUE TO SEVERE CRITICAL ILLNESS   PHYSICAL EXAMINATION:  GENERAL:critically ill appearing, +resp distress HEAD: Normocephalic, atraumatic.  EYES: Pupils equal, round, reactive to light.  No scleral icterus.  MOUTH: Moist mucosal membrane. NECK: Supple. No thyromegaly. No nodules. No JVD.  PULMONARY: +rhonchi,  CARDIOVASCULAR: S1 and S2. Regular rate and rhythm. No murmurs, rubs, or gallops.  GASTROINTESTINAL: Soft, nontender, -distended. No masses. Positive bowel sounds. No hepatosplenomegaly.  MUSCULOSKELETAL:+edema.  NEUROLOGIC: delerious SKIN:intact,warm,dry  MEDICATIONS: I have reviewed all medications and confirmed regimen as documented   CULTURE RESULTS   Recent Results (from the past 240 hour(s))  Blood Culture (routine x 2)     Status: None   Collection Time: 09/12/18  9:56 AM  Result Value Ref Range Status   Specimen Description BLOOD LEFT HAND  Final   Special Requests   Final    BOTTLES DRAWN AEROBIC AND ANAEROBIC Blood Culture adequate volume   Culture   Final    NO GROWTH 5 DAYS Performed at [facility], [address], [place], [place] [address]    Report Status 09/17/2018 FINAL  Final  Urine culture     Status: None   Collection Time: 09/12/18 10:01 AM  Result Value Ref Range Status   Specimen Description   Final    URINE, RANDOM Performed at [facility], [address] [place], [place] [address]    Special Requests   Final    NONE Performed at [facility], [address], [place], [place] [address]    Culture   Final    NO GROWTH Performed at [facility], [place] [address], [place], [place] [address]    Report Status 09/13/2018 FINAL  Final  SARS Coronavirus 2 (CEPHEID- Performed in [facility]), Hosp Order     Status: None   Collection Time: 09/12/18 10:02 AM  Result Value Ref Range Status   SARS Coronavirus 2 NEGATIVE NEGATIVE Final    Comment: (NOTE) If result is NEGATIVE SARS-CoV-2 target nucleic acids are NOT DETECTED. The SARS-CoV-2 RNA is generally detectable in upper and lower  respiratory specimens during the acute phase of infection. The lowest  concentration of SARS-CoV-2 viral copies this assay can detect is 250  copies / mL. A negative result does not preclude SARS-CoV-2 infection  and should not be used as the sole basis for treatment or other  patient management decisions.  A negative result may occur with  improper specimen collection / handling, submission of specimen other  than nasopharyngeal swab, presence of viral mutation(s) within the  areas targeted by this assay, and inadequate number of viral copies  (<250 copies / mL). A negative result must be combined with clinical  observations, patient history, and epidemiological information. If result is POSITIVE  SARS-CoV-2 target nucleic acids are DETECTED. The SARS-CoV-2 RNA is generally detectable in upper and lower  respiratory specimens dur ing the acute phase of infection.  Positive  results are indicative of active infection with SARS-CoV-2.  Clinical  correlation with patient history and other diagnostic information is  necessary to determine patient infection status.  Positive results do  not rule out bacterial infection or co-infection with other viruses. If result is PRESUMPTIVE POSTIVE SARS-CoV-2 nucleic  acids MAY BE PRESENT.   A presumptive positive result was obtained on the submitted specimen  and confirmed on repeat testing.  While 2019 novel coronavirus  (SARS-CoV-2) nucleic acids may be present in the submitted sample  additional confirmatory testing may be necessary for epidemiological  and / or clinical management purposes  to differentiate between  SARS-CoV-2 and other Sarbecovirus currently known to infect humans.  If clinically indicated additional testing with an alternate test  methodology [phone] is advised. The SARS-CoV-2 RNA is generally  detectable in upper and lower respiratory sp ecimens during the acute  phase of infection. The expected result is Negative. Fact Sheet for Patients:  StrictlyIdeas.no Fact Sheet for Healthcare Providers: BankingDealers.co.za This test is not yet approved or cleared by the [place] FDA and has been authorized for detection and/or diagnosis of SARS-CoV-2 by FDA under an Emergency Use Authorization (EUA).  This EUA will remain in effect (meaning this test can be used) for the duration of the COVID-19 declaration under Section 564(b)(1) of the Act, 21 U.S.C. section 360bbb-3(b)(1), unless the authorization is terminated or revoked sooner. Performed at [facility], [address] [place], [place] [address]   Blood Culture (routine x 2)     Status: None   Collection Time: 09/12/18 10:06 AM  Result Value Ref Range Status   Specimen Description BLOOD LEFT ANTECUBITAL  Final   Special Requests   Final    BOTTLES DRAWN AEROBIC AND ANAEROBIC Blood Culture adequate volume   Culture   Final    NO GROWTH 5 DAYS Performed at [facility], [address] [place], [place] [address]    Report Status 09/17/2018 FINAL  Final  MRSA PCR Screening     Status: None   Collection Time: 09/12/18  7:04 PM  Result Value Ref Range Status   MRSA by PCR NEGATIVE NEGATIVE Final     Comment:        The GeneXpert MRSA Assay (FDA approved for NASAL specimens only), is one component of a comprehensive MRSA colonization surveillance program. It is not intended to diagnose MRSA infection nor to guide or monitor treatment for MRSA infections. Performed at [facility], [address] [place], [place] [address]   C difficile quick scan w PCR reflex     Status: None   Collection Time: 09/14/18  2:21 PM  Result Value Ref Range Status   C Diff antigen NEGATIVE NEGATIVE Final   C Diff toxin NEGATIVE NEGATIVE Final   C Diff interpretation No C. difficile detected.  Final    Comment: Performed at [facility], [address] [place],  [address]  Group A Strep by PCR     Status: None   Collection Time: 09/15/18  1:53 PM  Result Value Ref Range Status   Group A Strep by PCR NOT DETECTED NOT DETECTED Final    Comment: Performed at [facility], [address], [place],  [address]  Body fluid culture     Status: None  Collection Time: 09/15/18  5:13 PM  Result Value Ref Range Status   Specimen Description   Final    KNEE Performed at [facility], [address] [place], [place] [address]    Special Requests   Final    NONE Performed at [facility], [address] [place], [place] [address]    Gram Stain   Final    RARE RBCS FEW WBC SEEN NO ORGANISMS SEEN Performed at [facility], [address] [place], [place] [address]    Culture   Final    NO GROWTH 3 DAYS Performed at [facility], [place] [address] [place], [place] [address]    Report Status 09/19/2018 FINAL  Final  Body fluid culture     Status: None (Preliminary result)   Collection Time: 09/16/18  8:55 AM  Result Value Ref Range Status   Specimen Description KNEE RIGHT  Final   Special Requests NONE  Final   Gram Stain   Final    RARE WBC PRESENT, PREDOMINANTLY PMN NO ORGANISMS SEEN    Culture   Final    NO  GROWTH 3 DAYS Performed at [facility], [place] [address], [place], [place] [address]    Report Status PENDING  Incomplete  Aerobic/Anaerobic Culture (surgical/deep wound)     Status: None (Preliminary result)   Collection Time: 09/17/18 11:32 AM  Result Value Ref Range Status   Specimen Description   Final    TISSUE LEFT KNEE 1 Performed at [facility], [address], [place], [place] [address]    Special Requests PATIENT ON FOLLOWING MAXIPIME VANCOMYCIN  Final   Gram Stain   Final    FEW WBC PRESENT,BOTH PMN AND MONONUCLEAR NO ORGANISMS SEEN    Culture   Final    NO GROWTH 2 DAYS NO ANAEROBES ISOLATED; CULTURE IN PROGRESS FOR 5 DAYS Performed at [facility], [place] [address], [place], [place] [address]    Report Status PENDING  Incomplete  Acid Fast Smear (AFB)     Status: None   Collection Time: 09/17/18 11:32 AM  Result Value Ref Range Status   AFB Specimen Processing Comment  Final    Comment: Tissue Grinding and Digestion/Decontamination   Acid Fast Smear Negative  Final    Comment: (NOTE) Performed At: [facility] [place], [place] [id number] Rush Farmer MD [id number]    Source (AFB) TISSUE  Final    Comment: LEFT KNEE Performed at [facility], [place] [address], [place], [place] [address]   Aerobic/Anaerobic Culture (surgical/deep wound)     Status: None (Preliminary result)   Collection Time: 09/17/18 11:43 AM  Result Value Ref Range Status   Specimen Description   Final    TISSUE LEFT KNEE 2 Performed at [facility], [address] [place], [place] [address]    Special Requests PATIENT ON FOLLOWING MAXIPIME VANCOMYCIN  Final   Gram Stain   Final    FEW WBC PRESENT, PREDOMINANTLY PMN NO ORGANISMS SEEN    Culture   Final    NO GROWTH 2 DAYS NO ANAEROBES ISOLATED; CULTURE IN PROGRESS FOR 5 DAYS Performed at [facility], [place] [address], [place],  [address]    Report Status  PENDING  Incomplete  Aerobic/Anaerobic Culture (surgical/deep wound)     Status: None (Preliminary result)   Collection Time: 09/17/18 11:44 AM  Result Value Ref Range Status   Specimen  Description   Final    TISSUE LEFT KNEE 3 Performed at [facility], [address] [place], [place] [address]    Special Requests PATIENT ON FOLLOWING MAXIPIME VANCOMYCIN  Final   Gram Stain   Final    MODERATE WBC PRESENT, PREDOMINANTLY PMN NO ORGANISMS SEEN    Culture   Final    NO GROWTH 2 DAYS NO ANAEROBES ISOLATED; CULTURE IN PROGRESS FOR 5 DAYS Performed at [facility], [place] [address] [place], [place] [address]    Report Status PENDING  Incomplete  Aerobic/Anaerobic Culture (surgical/deep wound)     Status: None (Preliminary result)   Collection Time: 09/18/18  9:43 PM  Result Value Ref Range Status   Specimen Description   Final    SYNOVIAL Performed at [facility], [address] [place], [place] [address]    Special Requests   Final    NONE Performed at [facility], [address] [place], [place] [address]    Gram Stain   Final    RARE WBC PRESENT, PREDOMINANTLY MONONUCLEAR NO ORGANISMS SEEN Performed at [facility], [place] [address], [place], [place] [address]    Culture PENDING  Incomplete   Report Status PENDING  Incomplete  Aerobic/Anaerobic Culture (surgical/deep wound)     Status: None (Preliminary result)   Collection Time: 09/18/18  9:44 PM  Result Value Ref Range Status   Specimen Description   Final    SYNOVIAL Performed at [facility], [address], [place], [place] [address]    Special Requests   Final    RIGHT KNEE Performed at [facility], [address] [place], [place] [address]    Gram Stain   Final    FEW WBC PRESENT,BOTH PMN AND MONONUCLEAR NO ORGANISMS SEEN Performed at [facility], [place] [address] [place], [place] [address]    Culture PENDING  Incomplete   Report Status  PENDING  Incomplete  Aerobic/Anaerobic Culture (surgical/deep wound)     Status: None (Preliminary result)   Collection Time: 09/18/18  9:45 PM  Result Value Ref Range Status   Specimen Description   Final    SYNOVIAL Performed at [facility], [address] [place], [place] [address]    Special Requests   Final    RIGHT KNEE Performed at [facility], [address] [place], [place] [address]    Gram Stain   Final    FEW WBC PRESENT, PREDOMINANTLY MONONUCLEAR NO ORGANISMS SEEN Performed at [facility], [place] [address] [place], [place] [address]    Culture PENDING  Incomplete   Report Status PENDING  Incomplete         CBC    Component Value Date/Time   WBC 11.2 (H) 09/19/2018 0439   RBC 3.33 (L) 09/19/2018 0439   HGB 9.0 (L) 09/19/2018 0439   HCT 28.4 (L) 09/19/2018 0439   PLT 269 09/19/2018 0439   MCV 85.3 09/19/2018 0439   MCH 27.0 09/19/2018 0439   MCHC 31.7 09/19/2018 0439   RDW 15.3 09/19/2018 0439   LYMPHSABS 2.7 09/19/2018 0439   MONOABS 1.5 (H) 09/19/2018 0439   EOSABS 0.5 09/19/2018 0439   BASOSABS 0.1 09/19/2018 0439   BMP Latest Ref Rng & Units 09/19/2018 09/18/2018 09/17/2018  Glucose 70 - 99 mg/dL 167(H) 186(H) 201(H)  BUN 6 - 20 mg/dL 22(H) 20 19  Creatinine 0.61 - 1.24 mg/dL 3.42(H) 2.60(H) 2.46(H)  BUN/Creat Ratio  6 - 22 (calc) - - -  Sodium 135 - 145 mmol/L 134(L) 136 135  Potassium 3.5 - 5.1 mmol/L 3.8 3.7 3.4(L)  Chloride 98 - 111 mmol/L 101 102 98  CO2 22 - 32 mmol/L 22 21(L) 22  Calcium 8.9 - 10.3 mg/dL 8.2(L) 8.8(L) 9.1        ASSESSMENT AND PLAN SYNOPSIS  INFECTIOUS DISEASE Severe SEPSIS b/l knee infections? Cultures NEG -continue antibiotics as prescribed -follow up cultures -follow up ID consultation  SEVERE ALCOHOL WITHDRAWAL -Therapy with Thiamine and MVI -CIWA Protocol -Precedex as needed -High risk for intubation  -high risk for aspiration   ACUTE KIDNEY INJURY/Renal Failure -follow chem  7 -follow UO -continue Foley Catheter-assess need -Avoid nephrotoxic agents -Recheck creatinine      SHOCK-SEPSIS/HYPOVOLUMIC/ -use vasopressors to keep MAP>65 if needed -emperic ABX -consider stress dose steroids  CARDIAC ICU monitoring  ID -continue IV abx as prescibed -follow up cultures  GI GI PROPHYLAXIS as indicated  NUTRITIONAL STATUS DIET-->NPO Constipation protocol as indicated  ENDO - will use ICU hypoglycemic\Hyperglycemia protocol if indicated   ELECTROLYTES -follow labs as needed -replace as needed -pharmacy consultation and following   DVT/GI PRX ordered TRANSFUSIONS AS NEEDED MONITOR FSBS ASSESS the need for LABS as needed   Critical Care Time devoted to patient care services described in this note is 34 minutes.   Overall, patient is critically ill, prognosis is guarded.    Corrin Parker, M.D.  [facility]  Medical Director [place] Director [facility] Cardio-Pulmonary Department

## 2018-09-19 NOTE — Progress Notes (Signed)
[facility]  ROUNDING NOTE   Subjective:    Intake/Output Summary (Last 24 hours) at 09/19/2018 1143 Last data filed at 09/19/2018 1010 Gross per 24 hour  Intake 2047.69 ml  Output 285 ml  Net 1762.69 ml   S Creatinine slightly higher at 3.42 Patient is delirious this morning. He underwent rt knee debridement last night Hypotensive, getting NS Bolus  Objective:  Vital signs in last 24 hours:  Temp:  [97.5 F (36.4 C)-103 F (39.4 C)] 103 F (39.4 C) (06/02 1014) Pulse Rate:  [112-131] 125 (06/02 1130) Resp:  [13-31] 19 (06/02 1130) BP: (65-156)/(45-132) 90/76 (06/02 1100) SpO2:  [93 %-100 %] 100 % (06/02 1130)  Weight change:  Filed Weights   09/12/18 0935  Weight: (!) 145.2 kg    Intake/Output: I/O last 3 completed shifts: In: 1970.1 [P.O.:480; I.V.:990.1; IV Piggyback:500] Out: 1035 [Urine:1035]   Intake/Output this shift:  Total I/O In: 240 [P.O.:240] Out: -   Physical Exam: General: NAD, ill appearing  Head: Normocephalic, atraumatic. Moist oral mucosal membranes  Eyes: Anicteric,    Neck: Supple,    Lungs:  Clear to auscultation  Heart: tachycardic  Abdomen:  Soft, nontender, obese  Extremities:  ++ peripheral edema., b.l knee dressing  Neurologic: Alert and oriented  Skin: No lesions    Foley in place    Basic Metabolic Panel: Recent Labs  Lab 09/15/18 0436 09/16/18 0459 09/17/18 0543 09/18/18 0517 09/19/18 0439  NA 127* 133* 135 136 134*  K 3.6 3.5 3.4* 3.7 3.8  CL 90* 96* 98 102 101  CO2 22 23 22  21* 22  GLUCOSE 213* 197* 201* 186* 167*  BUN 27* 20 19 20  22*  CREATININE 2.82* 2.20* 2.46* 2.60* 3.42*  CALCIUM 8.3* 8.7* 9.1 8.8* 8.2*  MG  --   --  1.5* 1.7 1.9  PHOS  --   --  4.5  --   --     Liver Function Tests: Recent Labs  Lab 09/15/18 0436 09/16/18 0459 09/19/18 0439  AST 148* 143* 70*  ALT 57* 59* 43  ALKPHOS 70 75 71  BILITOT 0.7 0.8 0.6  PROT 5.8* 6.2* 6.0*  ALBUMIN 1.9* 2.1* 1.9*   No results for  input(s): LIPASE, AMYLASE in the last 168 hours. No results for input(s): AMMONIA in the last 168 hours.  CBC: Recent Labs  Lab 09/14/18 0336 09/15/18 0436  09/15/18 1832 09/15/18 2255 09/16/18 0459 09/17/18 0543 09/19/18 0439  WBC 5.9 5.5  --   --   --  6.8 7.9 11.2*  NEUTROABS 3.5 3.0  --   --   --  4.3  --  6.4  HGB 8.0* 7.5*   < > 9.3* 9.4* 9.6* 9.5* 9.0*  HCT 24.5* 23.2*   < > 28.3* 28.4* 29.1* 29.3* 28.4*  MCV 81.9 82.0  --   --   --  81.3 81.8 85.3  PLT 144* 158  --   --   --  193 227 269   < > = values in this interval not displayed.    Cardiac Enzymes: Recent Labs  Lab 09/13/18 0344  CKTOTAL 4,516*    BNP: Invalid input(s): POCBNP  CBG: Recent Labs  Lab 09/18/18 0745 09/18/18 1159 09/18/18 1603 09/18/18 2321 09/19/18 0803  GLUCAP 181* 179* 175* 156* 138*    Microbiology: Results for orders placed or performed during the hospital encounter of 09/12/18  Blood Culture (routine x 2)     Status: None   Collection  Time: 09/12/18  9:56 AM  Result Value Ref Range Status   Specimen Description BLOOD LEFT HAND  Final   Special Requests   Final    BOTTLES DRAWN AEROBIC AND ANAEROBIC Blood Culture adequate volume   Culture   Final    NO GROWTH 5 DAYS Performed at [facility], [address], [place], [place] [address]    Report Status 09/17/2018 FINAL  Final  Urine culture     Status: None   Collection Time: 09/12/18 10:01 AM  Result Value Ref Range Status   Specimen Description   Final    URINE, RANDOM Performed at [facility], [address], [place], [place] [address]    Special Requests   Final    NONE Performed at [facility], [address] [place], [place] [address]    Culture   Final    NO GROWTH Performed at [facility], [place] [address], [place], [place] [address]    Report Status 09/13/2018 FINAL  Final  SARS Coronavirus 2 (CEPHEID- Performed in [place] hospital lab), Hosp Order     Status: None    Collection Time: 09/12/18 10:02 AM  Result Value Ref Range Status   SARS Coronavirus 2 NEGATIVE NEGATIVE Final    Comment: (NOTE) If result is NEGATIVE SARS-CoV-2 target nucleic acids are NOT DETECTED. The SARS-CoV-2 RNA is generally detectable in upper and lower  respiratory specimens during the acute phase of infection. The lowest  concentration of SARS-CoV-2 viral copies this assay can detect is 250  copies / mL. A negative result does not preclude SARS-CoV-2 infection  and should not be used as the sole basis for treatment or other  patient management decisions.  A negative result may occur with  improper specimen collection / handling, submission of specimen other  than nasopharyngeal swab, presence of viral mutation(s) within the  areas targeted by this assay, and inadequate number of viral copies  (<250 copies / mL). A negative result must be combined with clinical  observations, patient history, and epidemiological information. If result is POSITIVE SARS-CoV-2 target nucleic acids are DETECTED. The SARS-CoV-2 RNA is generally detectable in upper and lower  respiratory specimens dur ing the acute phase of infection.  Positive  results are indicative of active infection with SARS-CoV-2.  Clinical  correlation with patient history and other diagnostic information is  necessary to determine patient infection status.  Positive results do  not rule out bacterial infection or co-infection with other viruses. If result is PRESUMPTIVE POSTIVE SARS-CoV-2 nucleic acids MAY BE PRESENT.   A presumptive positive result was obtained on the submitted specimen  and confirmed on repeat testing.  While 2019 novel coronavirus  (SARS-CoV-2) nucleic acids may be present in the submitted sample  additional confirmatory testing may be necessary for epidemiological  and / or clinical management purposes  to differentiate between  SARS-CoV-2 and other Sarbecovirus currently known to infect humans.   If clinically indicated additional testing with an alternate test  methodology [phone]) is advised. The SARS-CoV-2 RNA is generally  detectable in upper and lower respiratory sp ecimens during the acute  phase of infection. The expected result is Negative. Fact Sheet for Patients:  StrictlyIdeas.no Fact Sheet for Healthcare Providers: BankingDealers.co.za This test is not yet approved or cleared by the [place] FDA and has been authorized for detection and/or diagnosis of SARS-CoV-2 by FDA under an Emergency Use Authorization (EUA).  This EUA will remain in effect (meaning this  test can be used) for the duration of the COVID-19 declaration under Section 564(b)(1) of the Act, 21 U.S.C. section 360bbb-3(b)(1), unless the authorization is terminated or revoked sooner. Performed at [facility], [address] [place], [place] [address]   Blood Culture (routine x 2)     Status: None   Collection Time: 09/12/18 10:06 AM  Result Value Ref Range Status   Specimen Description BLOOD LEFT ANTECUBITAL  Final   Special Requests   Final    BOTTLES DRAWN AEROBIC AND ANAEROBIC Blood Culture adequate volume   Culture   Final    NO GROWTH 5 DAYS Performed at [facility], [address] [place], [place] [address]    Report Status 09/17/2018 FINAL  Final  MRSA PCR Screening     Status: None   Collection Time: 09/12/18  7:04 PM  Result Value Ref Range Status   MRSA by PCR NEGATIVE NEGATIVE Final    Comment:        The GeneXpert MRSA Assay (FDA approved for NASAL specimens only), is one component of a comprehensive MRSA colonization surveillance program. It is not intended to diagnose MRSA infection nor to guide or monitor treatment for MRSA infections. Performed at [facility], [address] [place], [place] [address]   C difficile quick scan w PCR reflex     Status: None   Collection Time: 09/14/18   2:21 PM  Result Value Ref Range Status   C Diff antigen NEGATIVE NEGATIVE Final   C Diff toxin NEGATIVE NEGATIVE Final   C Diff interpretation No C. difficile detected.  Final    Comment: Performed at [facility], [address] [place], [place] [address]  Group A Strep by PCR     Status: None   Collection Time: 09/15/18  1:53 PM  Result Value Ref Range Status   Group A Strep by PCR NOT DETECTED NOT DETECTED Final    Comment: Performed at [facility], [address] [place], [place] [address]  Body fluid culture     Status: None   Collection Time: 09/15/18  5:13 PM  Result Value Ref Range Status   Specimen Description   Final    KNEE Performed at [facility], [address], [place], [place] [address]    Special Requests   Final    NONE Performed at [facility], [address] [place], [place] [address]    Gram Stain   Final    RARE RBCS FEW WBC SEEN NO ORGANISMS SEEN Performed at [facility], [address], [place], [place] [address]    Culture   Final    NO GROWTH 3 DAYS Performed at [facility], [place] [address], [place], [place] [address]    Report Status 09/19/2018 FINAL  Final  Body fluid culture     Status: None (Preliminary result)   Collection Time: 09/16/18  8:55 AM  Result Value Ref Range Status   Specimen Description KNEE RIGHT  Final   Special Requests NONE  Final   Gram Stain   Final    RARE WBC PRESENT, PREDOMINANTLY PMN NO ORGANISMS SEEN    Culture   Final    NO GROWTH 3 DAYS Performed at [facility], [place] [address] [place], [place] [address]    Report Status PENDING  Incomplete  Aerobic/Anaerobic Culture (surgical/deep wound)     Status: None (Preliminary result)   Collection Time: 09/17/18 11:32 AM  Result Value Ref Range Status   Specimen Description   Final    TISSUE LEFT KNEE 1 Performed at [facility], [address] [place], [place] [address]    Special  Requests PATIENT ON FOLLOWING MAXIPIME VANCOMYCIN  Final   Gram Stain   Final    FEW WBC PRESENT,BOTH PMN AND MONONUCLEAR NO ORGANISMS SEEN    Culture   Final    NO GROWTH 2 DAYS NO ANAEROBES ISOLATED; CULTURE IN PROGRESS FOR 5 DAYS Performed at [facility], [place] [address], [place], [place] [address]    Report Status PENDING  Incomplete  Aerobic/Anaerobic Culture (surgical/deep wound)     Status: None (Preliminary result)   Collection Time: 09/17/18 11:43 AM  Result Value Ref Range Status   Specimen Description   Final    TISSUE LEFT KNEE 2 Performed at [facility], [address], [place], [place] [address]    Special Requests PATIENT ON FOLLOWING MAXIPIME VANCOMYCIN  Final   Gram Stain   Final    FEW WBC PRESENT, PREDOMINANTLY PMN NO ORGANISMS SEEN    Culture   Final    NO GROWTH 2 DAYS NO ANAEROBES ISOLATED; CULTURE IN PROGRESS FOR 5 DAYS Performed at [facility], [place] [address] [place], [place] [address]    Report Status PENDING  Incomplete  Aerobic/Anaerobic Culture (surgical/deep wound)     Status: None (Preliminary result)   Collection Time: 09/17/18 11:44 AM  Result Value Ref Range Status   Specimen Description   Final    TISSUE LEFT KNEE 3 Performed at [facility], [address] [place], [place] [address]    Special Requests PATIENT ON FOLLOWING MAXIPIME VANCOMYCIN  Final   Gram Stain   Final    MODERATE WBC PRESENT, PREDOMINANTLY PMN NO ORGANISMS SEEN    Culture   Final    NO GROWTH 2 DAYS NO ANAEROBES ISOLATED; CULTURE IN PROGRESS FOR 5 DAYS Performed at [facility], [place] [address] [place], [place] [address]    Report Status PENDING  Incomplete    Coagulation Studies: No results for input(s): LABPROT, INR in the last 72 hours.  Urinalysis: No results for input(s): COLORURINE, LABSPEC, PHURINE, GLUCOSEU, HGBUR, BILIRUBINUR, KETONESUR, PROTEINUR, UROBILINOGEN, NITRITE, LEUKOCYTESUR in the last 72  hours.  Invalid input(s): APPERANCEUR    Imaging: No results found.   Medications:   . ceFEPime (MAXIPIME) IV 2 g (09/19/18 1123)  . norepinephrine (LEVOPHED) Adult infusion Stopped (09/18/18 2309)  . phenylephrine (NEO-SYNEPHRINE) Adult infusion Stopped (09/14/18 1305)  . vasopressin (PITRESSIN) infusion - *FOR SHOCK* Stopped (09/15/18 1829)   . sodium chloride   Intravenous Once  . aspirin EC  81 mg Oral Daily  . Chlorhexidine Gluconate Cloth  6 each Topical Q0600  . citalopram  20 mg Oral Daily  . docusate sodium  100 mg Oral BID  . fluticasone furoate-vilanterol  1 puff Inhalation Daily  . insulin aspart  0-15 Units Subcutaneous TID WC  . insulin aspart  0-5 Units Subcutaneous QHS  . insulin glargine  12 Units Subcutaneous QHS  . loratadine  10 mg Oral Daily  . rosuvastatin  20 mg Oral Daily  . tranexamic acid (CYKLOKAPRON) topical -INTRAOP  2,000 mg Topical Once  . vancomycin variable dose per unstable renal function (pharmacist dosing)   Does not apply See admin instructions   acetaminophen **OR** acetaminophen, albuterol, docusate sodium, HYDROcodone-acetaminophen, menthol-cetylpyridinium **OR** phenol, menthol-cetylpyridinium **OR** phenol, metoCLOPramide **OR** metoCLOPramide (REGLAN) injection,  metoCLOPramide **OR** metoCLOPramide (REGLAN) injection, ondansetron **OR** ondansetron (ZOFRAN) IV, ondansetron **OR** ondansetron (ZOFRAN) IV, oxyCODONE  Assessment/ Plan:  [name] is a 58 y.o. black male with hyeprtension, COPD, diabetes mellitus type II who underwent bilateral knee replacement surgeries last week. who was admitted to [facility] on 09/12/2018 for sepsis  1. Acute renal failure with metabolic acidosis: baseline creatinine of 1.07 with normal GFR in 06/12/18.  TMP/SMX could have been contributing to renal failure Nonoliguric urine output - Holding metformin, lisinopril and TMP/SMX Serum creatinine slightly higher today compared to yesterday Continue to  monitor closely Agree with maintenance IV fluids  Electrolytes and Volume status are acceptable No acute indication for Dialysis at present   2. Septic shock/hypotension Requiring vasopressors -Recent bilateral knee replacement on Sep 01, 2018 at [facility]  -Suspected left knee septic arthritis, patient underwent I&D and polyethylene exchange on 5/31 and Rt knee on 6/1 -Currently being treated with cefepime and vancomycin  3. Hyponatremia and peripheral edema: secondary to renal failure and hypervolemic volume status.  - monitor closely     LOS: [place] 6/2/202011:43 AM

## 2018-09-19 NOTE — Anesthesia Postprocedure Evaluation (Signed)
Anesthesia Post Note  Patient: Isaac Cross  Procedure(s) Performed: IRRIGATION AND DEBRIDEMENT KNEE WITH POLY EXCHANGE (Right )  Patient location during evaluation: ICU Anesthesia Type: General Level of consciousness: awake and alert Pain management: pain level controlled Vital Signs Assessment: post-procedure vital signs reviewed and stable Respiratory status: spontaneous breathing, nonlabored ventilation, respiratory function stable and patient connected to nasal cannula oxygen Cardiovascular status: blood pressure returned to baseline and stable Postop Assessment: no apparent nausea or vomiting Anesthetic complications: no     Last Vitals:  Vitals:   09/19/18 0500 09/19/18 0600  BP: 105/65 (!) 77/57  Pulse: (!) 125 (!) 122  Resp: 20 19  Temp:    SpO2: 95% 98%    Last Pain:  Vitals:   09/19/18 0546  TempSrc:   PainSc: Asleep                 Alison Stalling

## 2018-09-19 NOTE — Progress Notes (Signed)
Notified Dr Mortimer Fries that pts UOP had decreased over past few hours, pt on IVF, no new orders at this time.

## 2018-09-19 NOTE — Progress Notes (Signed)
Patient responsive to verbal stimulus. Febrile on reassessment (102F). Patient SBP running between high 70's and low 90's. Urine output first 4 hours shift less than 30 ml/hr. Polar ice to bilateral knees, dumped and refilled, and functioning properly.  Darlyn Chamber, NP alerted.

## 2018-09-19 NOTE — Progress Notes (Signed)
Spoke with patient wife [name].  Updated her regarding patient's status and to request consent for procedure to the right knee for irrigation and debridement with polyethylene exchange, as indicated for right knee septic arthritis

## 2018-09-19 NOTE — Progress Notes (Signed)
Spoke with pts wife, updated on pt status and plan of care for the day.  Pt spoke with wife on phone for a few minutes.

## 2018-09-19 NOTE — Progress Notes (Signed)
Patient returned from PACU at [facility], spoke with PACU nurse - Vickie to update regarding procedure to right knee for irrigation and debridement with polyethylene exchange as indicated for right knee septic arthritis.  Patient received 913ml of Lactated Ringer during procedure, urine output was 40 ml while in the OR and 27ml urine output while in PACU.  Patient's right knee dressing is: 4x4 gauze, xeroform, bias cut, web roll, and polar care.

## 2018-09-19 NOTE — Progress Notes (Signed)
PT Cancellation Note  Patient Details Name: Isaac Cross MRN: [id number] DOB: 21-Aug-1960   Cancelled Treatment:    Reason Eval/Treat Not Completed: Patient not medically ready.  Chart reviewed.  Pt noted with HR 128-129 bpm and BP 87/72 at rest in bed; pt also noted to be talking to himself.  Nurse reporting pt also with fever and confused today.  Per chart review pt also with worsening renal function today.  D/t pt's current medical status/concerns, will hold PT at this time (discussed with pt's nurse) and will re-attempt PT re-evaluation at a later date/time as medically appropriate.  Leitha Bleak, PT 09/19/18, 12:22 PM [phone]

## 2018-09-20 ENCOUNTER — Inpatient Hospital Stay: Payer: BC Managed Care – PPO

## 2018-09-20 ENCOUNTER — Inpatient Hospital Stay
Admit: 2018-09-20 | Discharge: 2018-09-20 | Disposition: A | Discharge status: Home / Self Care | Payer: BC Managed Care – PPO | Attending: Critical Care Medicine | Admitting: Critical Care Medicine

## 2018-09-20 DIAGNOSIS — I959 Hypotension, unspecified: Secondary | ICD-10-CM

## 2018-09-20 DIAGNOSIS — F10231 Alcohol dependence with withdrawal delirium: Secondary | ICD-10-CM

## 2018-09-20 LAB — HEMOGLOBIN AND HEMATOCRIT, BLOOD
HCT: 27.3 % — ABNORMAL LOW (ref 39.0–52.0)
Hemoglobin: 8.4 g/dL — ABNORMAL LOW (ref 13.0–17.0)

## 2018-09-20 LAB — BODY FLUID CULTURE: Culture: NO GROWTH

## 2018-09-20 LAB — LACTATE DEHYDROGENASE: LDH: 406 U/L — ABNORMAL HIGH (ref 98–192)

## 2018-09-20 LAB — BASIC METABOLIC PANEL
Anion gap: 11 (ref 5–15)
BUN: 30 mg/dL — ABNORMAL HIGH (ref 6–20)
CO2: 19 mmol/L — ABNORMAL LOW (ref 22–32)
Calcium: 8.2 mg/dL — ABNORMAL LOW (ref 8.9–10.3)
Chloride: 107 mmol/L (ref 98–111)
Creatinine, Ser: 5.98 mg/dL — ABNORMAL HIGH (ref 0.61–1.24)
GFR calc Af Amer: 11 mL/min — ABNORMAL LOW (ref >60)
GFR calc non Af Amer: 9 mL/min — ABNORMAL LOW (ref >60)
Glucose, Bld: 174 mg/dL — ABNORMAL HIGH (ref 70–99)
Potassium: 3.9 mmol/L (ref 3.5–5.1)
Sodium: 137 mmol/L (ref 135–145)

## 2018-09-20 LAB — CBC
HCT: 28.4 % — ABNORMAL LOW (ref 39.0–52.0)
Hemoglobin: 8.6 g/dL — ABNORMAL LOW (ref 13.0–17.0)
MCH: 26 pg (ref 26.0–34.0)
MCHC: 30.3 g/dL (ref 30.0–36.0)
MCV: 85.8 fL (ref 80.0–100.0)
Platelets: 354 10*3/uL (ref 150–400)
RBC: 3.31 MIL/uL — ABNORMAL LOW (ref 4.22–5.81)
RDW: 15.7 % — ABNORMAL HIGH (ref 11.5–15.5)
WBC: 13.5 10*3/uL — ABNORMAL HIGH (ref 4.0–10.5)
nRBC: 0 % (ref 0.0–0.2)

## 2018-09-20 LAB — URINALYSIS, COMPLETE (UACMP) WITH MICROSCOPIC
Bilirubin Urine: NEGATIVE
Glucose, UA: NEGATIVE mg/dL
Ketones, ur: NEGATIVE mg/dL
Nitrite: NEGATIVE
Protein, ur: 100 mg/dL — AB
Specific Gravity, Urine: 1.016 (ref 1.005–1.030)
Squamous Epithelial / HPF: NONE SEEN (ref 0–5)
pH: 5 (ref 5.0–8.0)

## 2018-09-20 LAB — HEPATIC FUNCTION PANEL
ALT: 35 U/L (ref 0–44)
AST: 64 U/L — ABNORMAL HIGH (ref 15–41)
Albumin: 1.8 g/dL — ABNORMAL LOW (ref 3.5–5.0)
Alkaline Phosphatase: 67 U/L (ref 38–126)
Bilirubin, Direct: 0.2 mg/dL (ref 0.0–0.2)
Indirect Bilirubin: 0.5 mg/dL (ref 0.3–0.9)
Total Bilirubin: 0.7 mg/dL (ref 0.3–1.2)
Total Protein: 6.1 g/dL — ABNORMAL LOW (ref 6.5–8.1)

## 2018-09-20 LAB — VANCOMYCIN, RANDOM: Vancomycin Rm: 15

## 2018-09-20 LAB — PROCALCITONIN: Procalcitonin: 3.97 ng/mL

## 2018-09-20 LAB — ANA W/REFLEX IF POSITIVE: Anti Nuclear Antibody (ANA): NEGATIVE

## 2018-09-20 LAB — CORTISOL: Cortisol, Plasma: 18.4 ug/dL

## 2018-09-20 LAB — APTT: aPTT: 35 seconds (ref 24–36)

## 2018-09-20 LAB — ANGIOTENSIN CONVERTING ENZYME: Angiotensin-Converting Enzyme: 14 U/L (ref 14–82)

## 2018-09-20 LAB — CK: Total CK: 992 U/L — ABNORMAL HIGH (ref 49–397)

## 2018-09-20 LAB — GLUCOSE, CAPILLARY
Glucose-Capillary: 151 mg/dL — ABNORMAL HIGH (ref 70–99)
Glucose-Capillary: 149 mg/dL — ABNORMAL HIGH (ref 70–99)
Glucose-Capillary: 166 mg/dL — ABNORMAL HIGH (ref 70–99)
Glucose-Capillary: 184 mg/dL — ABNORMAL HIGH (ref 70–99)

## 2018-09-20 LAB — HEPARIN LEVEL (UNFRACTIONATED): Heparin Unfractionated: <0.1 IU/mL — ABNORMAL LOW (ref 0.30–0.70)

## 2018-09-20 LAB — SEDIMENTATION RATE: Sed Rate: 106 mm/hr — ABNORMAL HIGH (ref 0–20)

## 2018-09-20 LAB — AMMONIA: Ammonia: 12 umol/L (ref 9–35)

## 2018-09-20 LAB — MAGNESIUM: Magnesium: 2 mg/dL (ref 1.7–2.4)

## 2018-09-20 IMAGING — US VENOUS DOPPLER ULTRASOUND OF  LOWER EXTREMITIES
1 series · 12 of 24 positions shown · non-contrast
Comparison: None.

CLINICAL DATA: Bilateral lower extremity edema. History of knee
surgery. Evaluate for DVT.



[Series 1: venous doppler ultrasound of lower extremities · 12 of 74 slices shown]
[im 4/74]
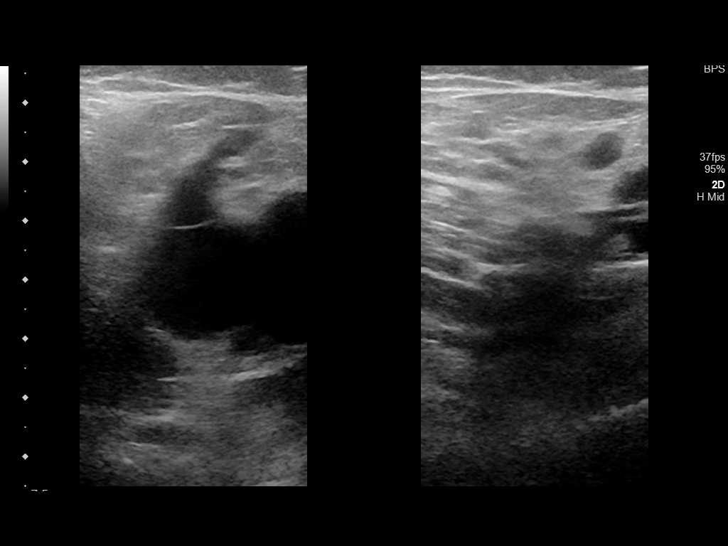
[im 10/74]
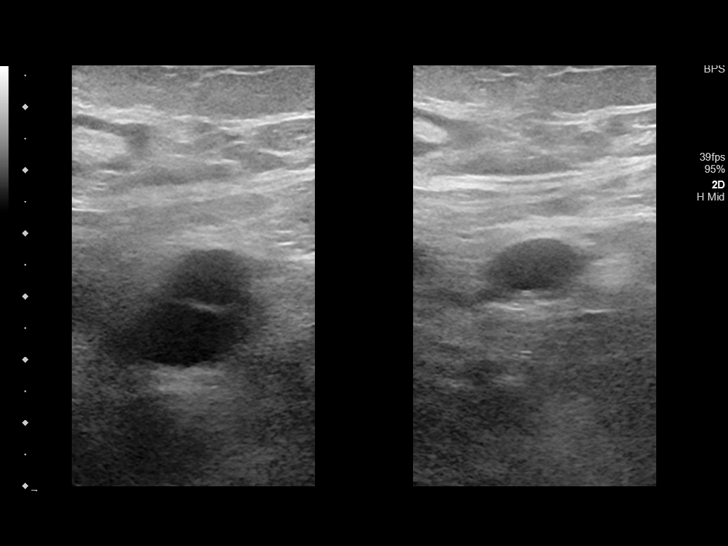
[im 16/74]
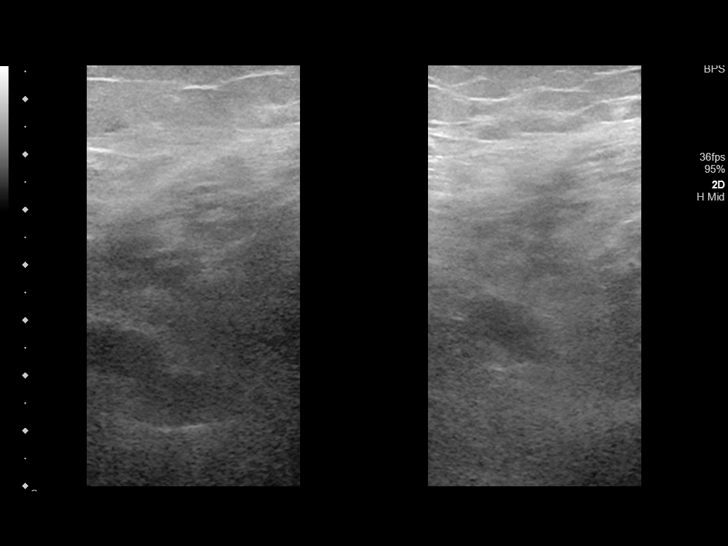
[im 23/74]
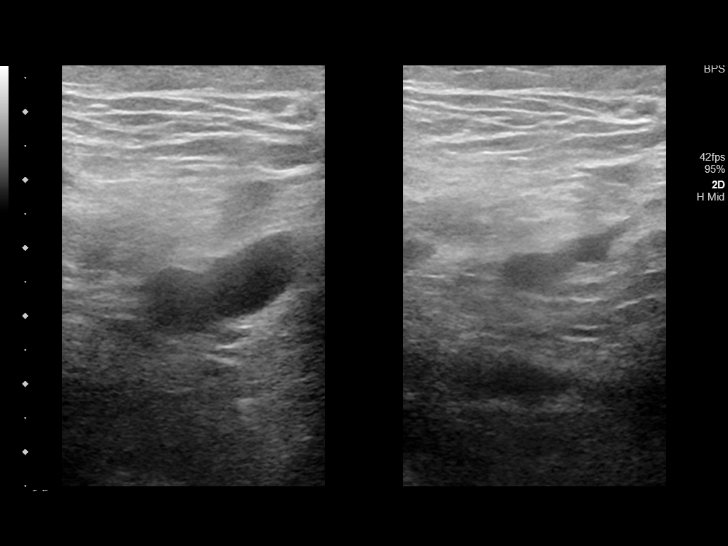
[im 29/74]
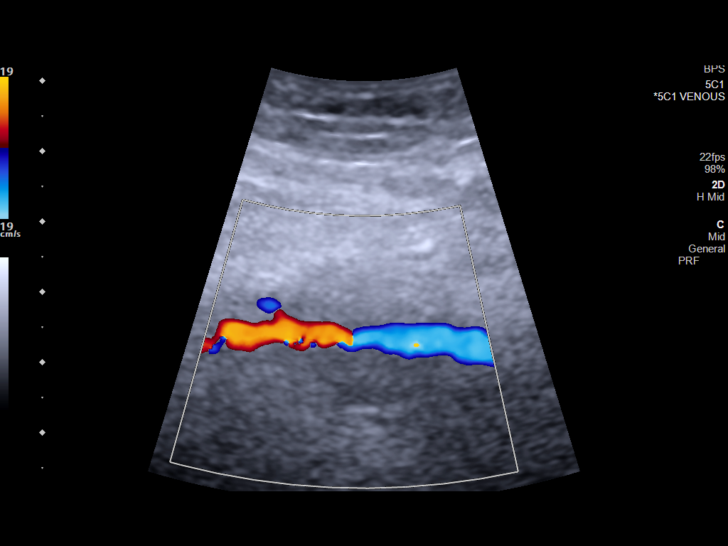
[im 35/74]
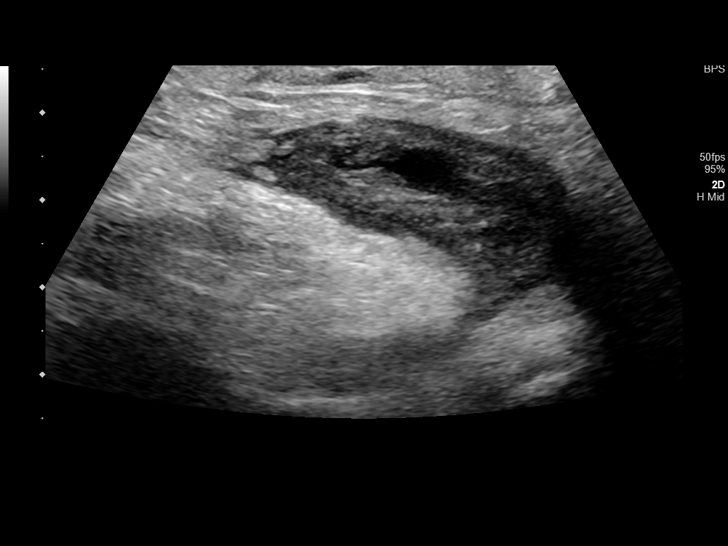
[im 42/74]
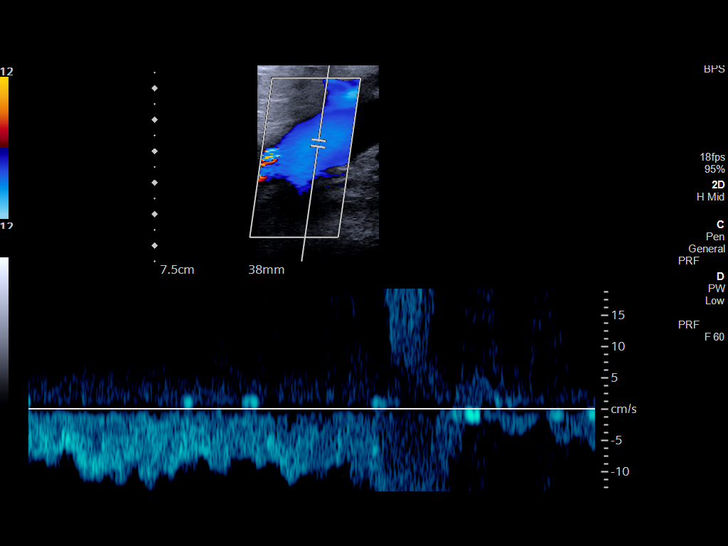
[im 48/74]
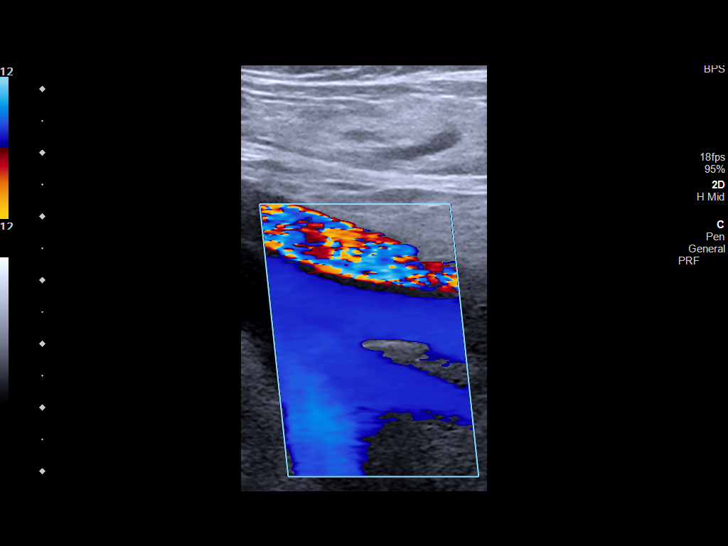
[im 54/74]
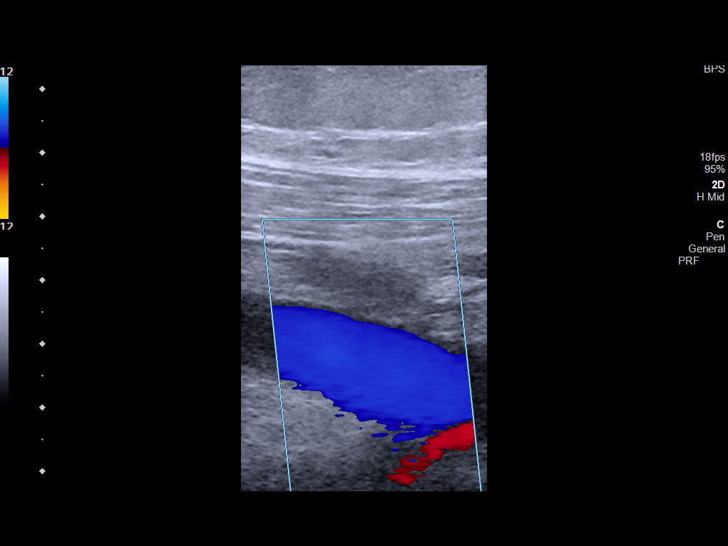
[im 61/74]
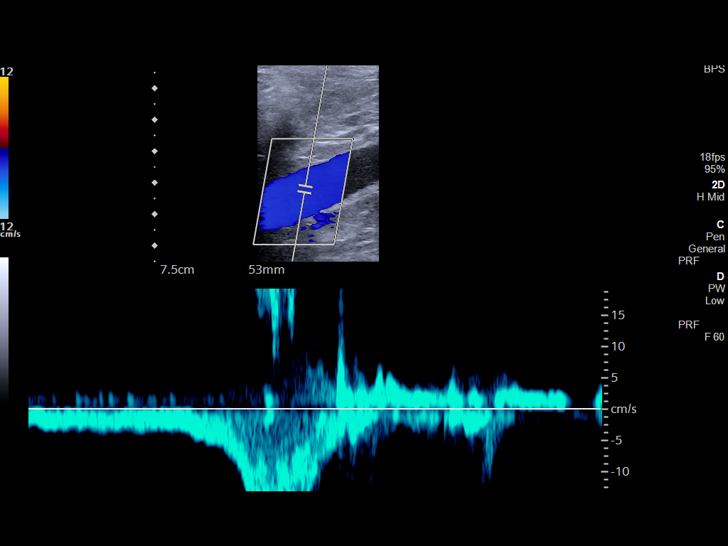
[im 67/74]
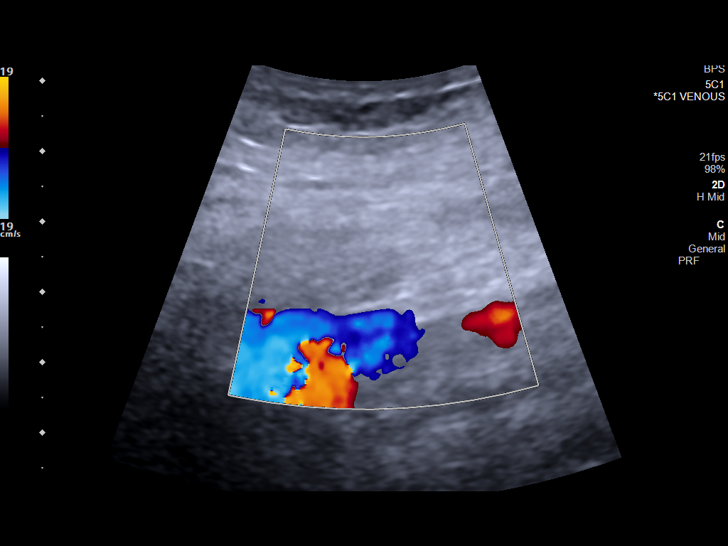
[im 74/74]
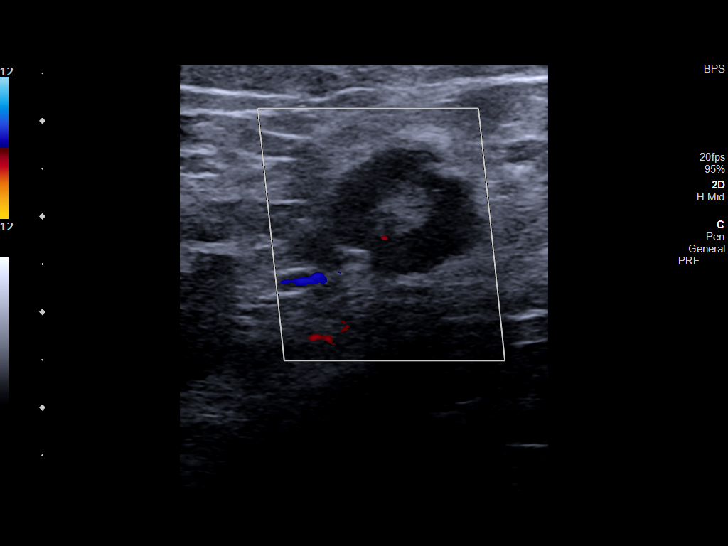

[12 of 24 positions shown; findings below may reference images not displayed]

FINDINGS: Examination is degraded due to patient body habitus and poor
sonographic window.

RIGHT LOWER EXTREMITY

Common Femoral Vein: No evidence of thrombus. Normal
compressibility, respiratory phasicity and response to augmentation.

Saphenofemoral Junction: No evidence of thrombus. Normal
compressibility and flow on color Doppler imaging.

Profunda Femoral Vein: No evidence of thrombus. Normal
compressibility and flow on color Doppler imaging.

Femoral Vein: No evidence of thrombus. Normal compressibility,
respiratory phasicity and response to augmentation.

Popliteal Vein: No evidence of thrombus. Normal compressibility,
respiratory phasicity and response to augmentation.

Calf Veins: Appear patent where imaged.

Superficial Great Saphenous Vein: No evidence of thrombus. Normal
compressibility.

Other Findings: Note is made of a right inguinal lymph node which is
not enlarged by size criteria measuring 1 cm greatest short axis
diameter and maintains a benign fatty hila.

LEFT LOWER EXTREMITY

Common Femoral Vein: No evidence of thrombus. Normal
compressibility, respiratory phasicity and response to augmentation.

Saphenofemoral Junction: No evidence of thrombus. Normal
compressibility and flow on color Doppler imaging.

Profunda Femoral Vein: No evidence of thrombus. Normal
compressibility and flow on color Doppler imaging.

Femoral Vein: No evidence of thrombus. Normal compressibility,
respiratory phasicity and response to augmentation.

Popliteal Vein: There is hypoechoic near occlusive thrombus within
the distal aspect the left popliteal vein (images 25 through 28).

Calf Veins: Appear patent where imaged.

Superficial Great Saphenous Vein: No evidence of thrombus. Normal
compressibility.

Other Findings: Note is made of an approximately 5.2 x 3.2 x 1.5 cm
mixed echogenic serpiginous fluid collection with left popliteal
fossa compatible with a Baker cyst.
IMPRESSION: 1. The examination is positive for near occlusive DVT involving the
distal aspect of the left popliteal vein.
2. No evidence of DVT within the right lower extremity.
3. Note made of an approximately 5.2 cm complex left-sided Baker's
cyst.

## 2018-09-20 IMAGING — DX PORTABLE CHEST - 1 VIEW
2 series · 2 of 2 positions shown · non-contrast
Comparison: [DATE]

CLINICAL DATA: Fevers

EXAM:
PORTABLE CHEST 1 VIEW

[chest ap (1 of 2)]
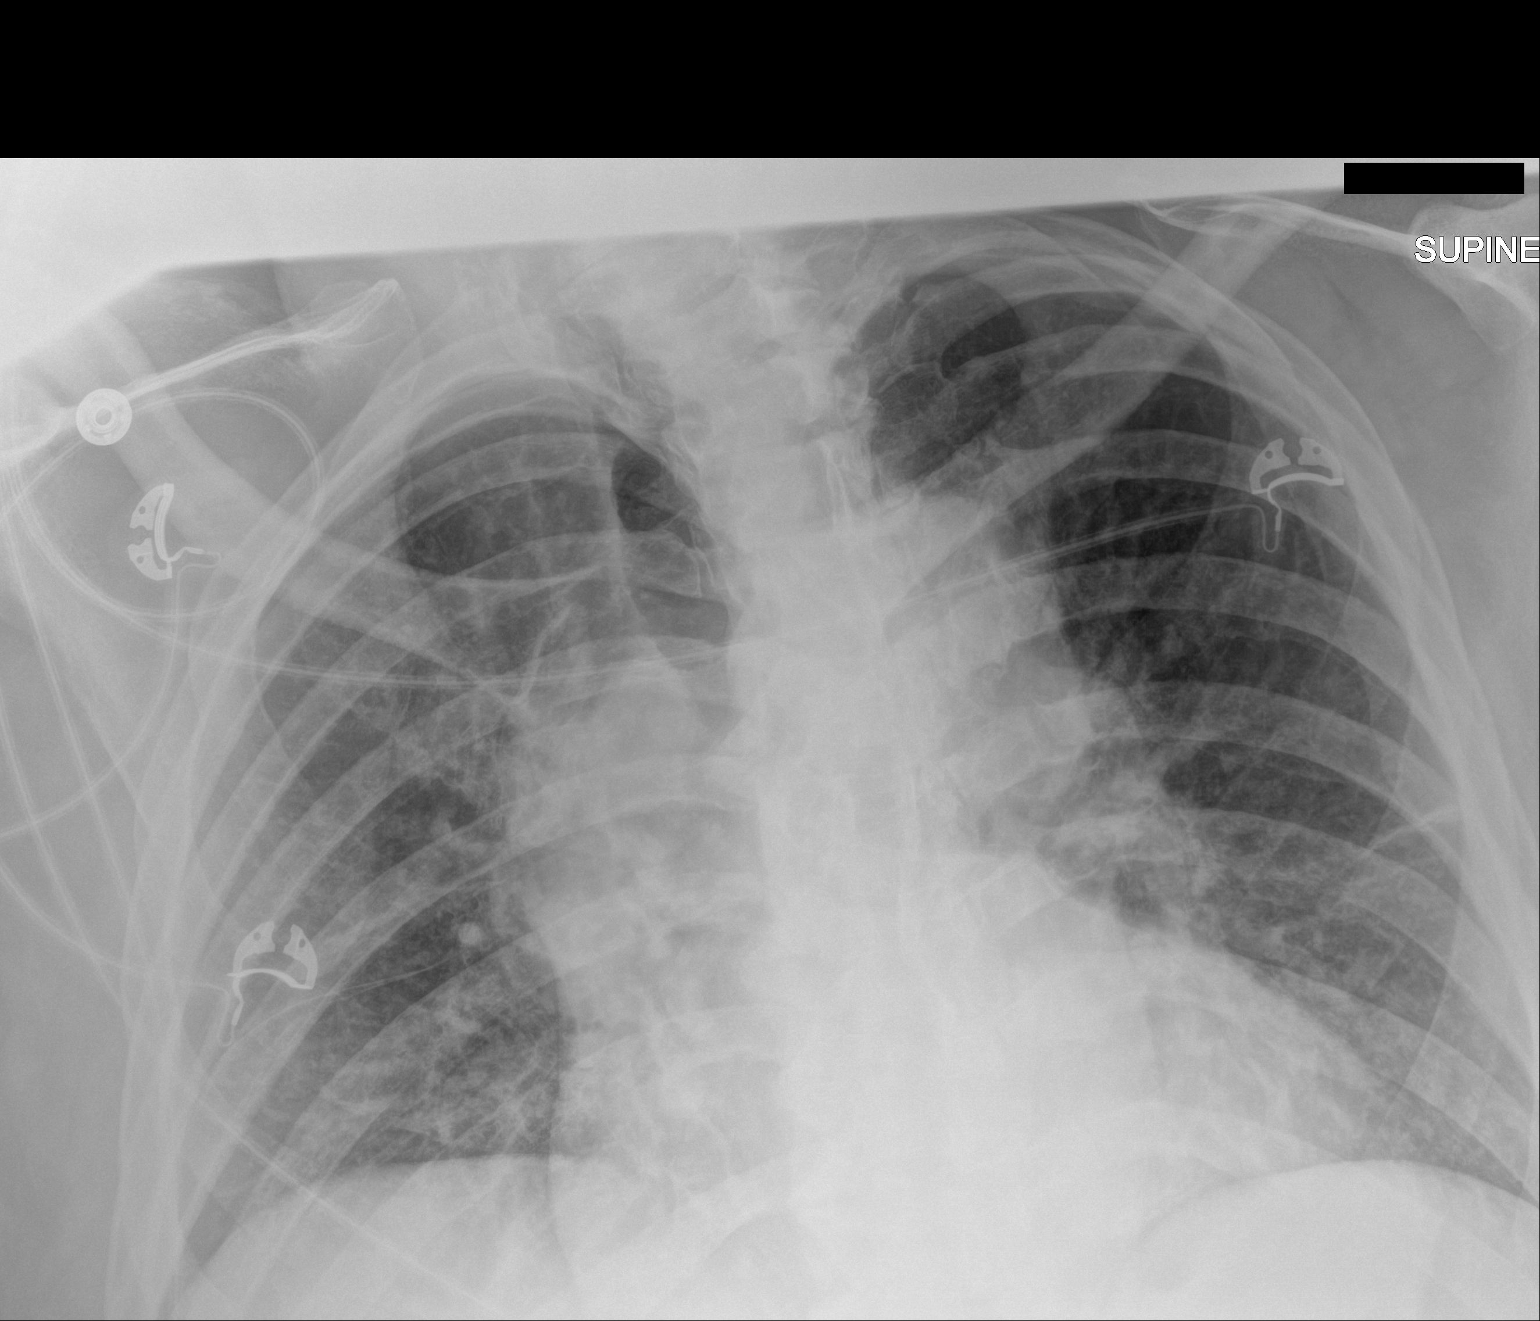

[chest ap (2 of 2)]
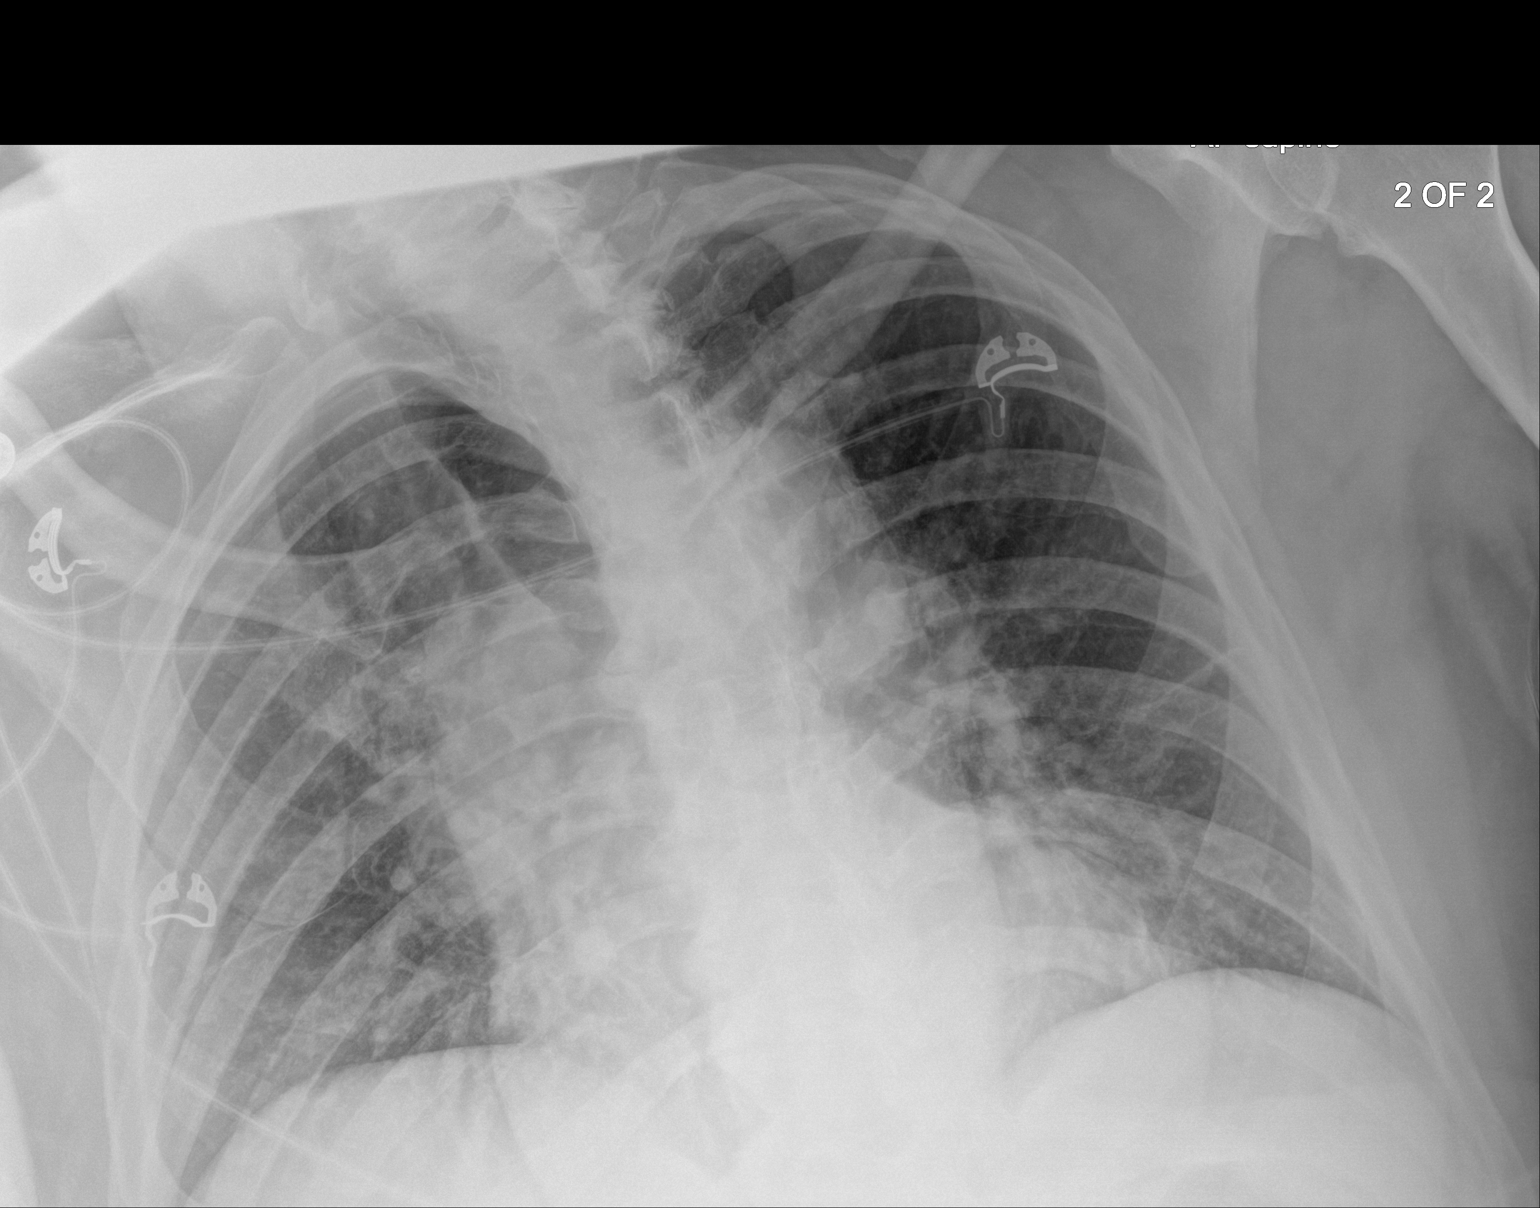

[2 of 2 positions shown; findings below may reference images not displayed]

FINDINGS: Cardiac shadow is stable. Previously seen right jugular central line
is been removed. The lungs demonstrate mild bibasilar atelectasis
right greater than left. No bony abnormality is seen.
IMPRESSION: Mild bibasilar atelectasis.

## 2018-09-20 MED ORDER — SODIUM CHLORIDE 0.9 % IV SOLN
INTRAVENOUS | Status: DC
  Administered 2018-09-20 – 2018-09-21 (×2): via INTRAVENOUS

## 2018-09-20 MED ORDER — DEXMEDETOMIDINE HCL IN NACL 400 MCG/100ML IV SOLN
0.4000 ug/kg/h | INTRAVENOUS | Status: DC
  Administered 2018-09-20: 16:00:00 0.4 ug/kg/h via INTRAVENOUS
  Filled 2018-09-20: qty 100

## 2018-09-20 MED ORDER — HYDROCORTISONE NA SUCCINATE PF 100 MG IJ SOLR
50.0000 mg | Freq: Four times a day (QID) | INTRAMUSCULAR | Status: DC
  Administered 2018-09-20 – 2018-09-25 (×20): 50 mg via INTRAVENOUS
  Filled 2018-09-20 (×7): qty 2
  Filled 2018-09-20: qty 1
  Filled 2018-09-20 (×6): qty 2
  Filled 2018-09-20: qty 1
  Filled 2018-09-20 (×6): qty 2

## 2018-09-20 MED ORDER — IOHEXOL 240 MG/ML SOLN: 50.0000 mL | Freq: Once | INTRAMUSCULAR | Status: DC | PRN

## 2018-09-20 MED ORDER — SODIUM CHLORIDE 0.9 % IV SOLN
100.0000 mg | INTRAVENOUS | Status: AC
  Administered 2018-09-21 – 2018-09-26 (×5): 100 mg via INTRAVENOUS
  Filled 2018-09-20 (×8): qty 100

## 2018-09-20 MED ORDER — SODIUM CHLORIDE 0.9 % IV SOLN
200.0000 mg | Freq: Once | INTRAVENOUS | Status: AC
  Administered 2018-09-20: 200 mg via INTRAVENOUS
  Filled 2018-09-20: qty 200

## 2018-09-20 MED ORDER — HEPARIN (PORCINE) 25000 UT/250ML-% IV SOLN
2800.0000 [IU]/h | INTRAVENOUS | Status: DC
  Administered 2018-09-20 – 2018-09-21 (×2): 1900 [IU]/h via INTRAVENOUS
  Administered 2018-09-21: 2450 [IU]/h via INTRAVENOUS
  Administered 2018-09-21: 15:00:00 2300 [IU]/h via INTRAVENOUS
  Administered 2018-09-22: 20:00:00 2800 [IU]/h via INTRAVENOUS
  Administered 2018-09-22: 2550 [IU]/h via INTRAVENOUS
  Administered 2018-09-23: 04:00:00 2800 [IU]/h via INTRAVENOUS
  Filled 2018-09-20 (×7): qty 250

## 2018-09-20 MED ORDER — SODIUM CHLORIDE 0.9 % IV SOLN
100.0000 mg | Freq: Two times a day (BID) | INTRAVENOUS | Status: DC
  Administered 2018-09-20 – 2018-09-27 (×13): 100 mg via INTRAVENOUS
  Filled 2018-09-20 (×17): qty 100

## 2018-09-20 MED ORDER — SODIUM CHLORIDE 0.9 % IV BOLUS
1000.0000 mL | Freq: Once | INTRAVENOUS | Status: AC
  Administered 2018-09-20: 11:00:00 1000 mL via INTRAVENOUS

## 2018-09-20 MED ORDER — ALBUMIN HUMAN 25 % IV SOLN
25.0000 g | Freq: Once | INTRAVENOUS | Status: AC
  Administered 2018-09-20: 25 g via INTRAVENOUS
  Filled 2018-09-20: qty 100

## 2018-09-20 NOTE — Progress Notes (Signed)
CRITICAL CARE NOTE  CC  follow up Sepsis  SUBJECTIVE Patient remains critically ill Prognosis is guarded On CPAP for OSA On CIWA protocol Sepsis and fevers Creatinine increased    BP 104/70   Pulse (!) 123   Temp 100 F (37.8 C)   Resp (!) 33   Ht 6\' 4"  (1.93 m)   Wt (!) 145.2 kg   SpO2 100%   BMI 38.95 kg/m    I/O last 3 completed shifts: In: 3277.8 [P.O.:240; I.V.:1834.5; IV Piggyback:1203.3] Out: 660 [Urine:660] No intake/output data recorded.  SpO2: 100 % O2 Flow Rate (L/min): 6 L/min     REVIEW OF SYSTEMS  PATIENT IS UNABLE TO PROVIDE COMPLETE REVIEW OF SYSTEMS DUE TO SEVERE CRITICAL ILLNESS   PHYSICAL EXAMINATION:  GENERAL:critically ill appearing,  HEAD: Normocephalic, atraumatic.  EYES: Pupils equal, round, reactive to light.  No scleral icterus.  MOUTH: Moist mucosal membrane. NECK: Supple. No thyromegaly. No nodules. No JVD.  PULMONARY: +rhonchi, +wheezing CARDIOVASCULAR: S1 and S2. Regular rate and rhythm. No murmurs, rubs, or gallops.  GASTROINTESTINAL: Soft, nontender, -distended. No masses. Positive bowel sounds. No hepatosplenomegaly.  MUSCULOSKELETAL: +edema. +swelling NEUROLOGIC: obtunded, SKIN:intact,warm,dry  MEDICATIONS: I have reviewed all medications and confirmed regimen as documented   CULTURE RESULTS   Recent Results (from the past 240 hour(s))  Blood Culture (routine x 2)     Status: None   Collection Time: 09/12/18  9:56 AM  Result Value Ref Range Status   Specimen Description BLOOD LEFT HAND  Final   Special Requests   Final    BOTTLES DRAWN AEROBIC AND ANAEROBIC Blood Culture adequate volume   Culture   Final    NO GROWTH 5 DAYS Performed at [facility], [address], [place], [place] [address]    Report Status 09/17/2018 FINAL  Final  Urine culture     Status: None   Collection Time: 09/12/18 10:01 AM  Result Value Ref Range Status   Specimen Description   Final    URINE, RANDOM Performed at  [facility], [address], [place], [place] [address]    Special Requests   Final    NONE Performed at [facility], [address], [place], [place] [address]    Culture   Final    NO GROWTH Performed at [facility], [place] [address], [place], [place] [address]    Report Status 09/13/2018 FINAL  Final  SARS Coronavirus 2 (CEPHEID- Performed in [place] hospital lab), Hosp Order     Status: None   Collection Time: 09/12/18 10:02 AM  Result Value Ref Range Status   SARS Coronavirus 2 NEGATIVE NEGATIVE Final    Comment: (NOTE) If result is NEGATIVE SARS-CoV-2 target nucleic acids are NOT DETECTED. The SARS-CoV-2 RNA is generally detectable in upper and lower  respiratory specimens during the acute phase of infection. The lowest  concentration of SARS-CoV-2 viral copies this assay can detect is 250  copies / mL. A negative result does not preclude SARS-CoV-2 infection  and should not be used as the sole basis for treatment or other  patient management decisions.  A negative result may occur with  improper specimen collection / handling, submission of specimen other  than nasopharyngeal swab, presence of viral mutation(s) within the  areas targeted by this assay, and inadequate number of viral copies  (<250 copies / mL). A negative result must be combined with clinical  observations, patient history, and epidemiological information. If result is POSITIVE  SARS-CoV-2 target nucleic acids are DETECTED. The SARS-CoV-2 RNA is generally detectable in upper and lower  respiratory specimens dur ing the acute phase of infection.  Positive  results are indicative of active infection with SARS-CoV-2.  Clinical  correlation with patient history and other diagnostic information is  necessary to determine patient infection status.  Positive results do  not rule out bacterial infection or co-infection with other viruses. If result is PRESUMPTIVE POSTIVE SARS-CoV-2  nucleic acids MAY BE PRESENT.   A presumptive positive result was obtained on the submitted specimen  and confirmed on repeat testing.  While 2019 novel coronavirus  (SARS-CoV-2) nucleic acids may be present in the submitted sample  additional confirmatory testing may be necessary for epidemiological  and / or clinical management purposes  to differentiate between  SARS-CoV-2 and other Sarbecovirus currently known to infect humans.  If clinically indicated additional testing with an alternate test  methodology [phone]) is advised. The SARS-CoV-2 RNA is generally  detectable in upper and lower respiratory sp ecimens during the acute  phase of infection. The expected result is Negative. Fact Sheet for Patients:  StrictlyIdeas.no Fact Sheet for Healthcare Providers: BankingDealers.co.za This test is not yet approved or cleared by the [place] FDA and has been authorized for detection and/or diagnosis of SARS-CoV-2 by FDA under an Emergency Use Authorization (EUA).  This EUA will remain in effect (meaning this test can be used) for the duration of the COVID-19 declaration under Section 564(b)(1) of the Act, 21 U.S.C. section 360bbb-3(b)(1), unless the authorization is terminated or revoked sooner. Performed at [facility], [address] [place], [place] [address]   Blood Culture (routine x 2)     Status: None   Collection Time: 09/12/18 10:06 AM  Result Value Ref Range Status   Specimen Description BLOOD LEFT ANTECUBITAL  Final   Special Requests   Final    BOTTLES DRAWN AEROBIC AND ANAEROBIC Blood Culture adequate volume   Culture   Final    NO GROWTH 5 DAYS Performed at [facility], [address] [place], [place] [address]    Report Status 09/17/2018 FINAL  Final  MRSA PCR Screening     Status: None   Collection Time: 09/12/18  7:04 PM  Result Value Ref Range Status   MRSA by PCR NEGATIVE NEGATIVE  Final    Comment:        The GeneXpert MRSA Assay (FDA approved for NASAL specimens only), is one component of a comprehensive MRSA colonization surveillance program. It is not intended to diagnose MRSA infection nor to guide or monitor treatment for MRSA infections. Performed at [facility], [address] [place], [place] [address]   C difficile quick scan w PCR reflex     Status: None   Collection Time: 09/14/18  2:21 PM  Result Value Ref Range Status   C Diff antigen NEGATIVE NEGATIVE Final   C Diff toxin NEGATIVE NEGATIVE Final   C Diff interpretation No C. difficile detected.  Final    Comment: Performed at [facility], [address] [place], [place] [address]  Group A Strep by PCR     Status: None   Collection Time: 09/15/18  1:53 PM  Result Value Ref Range Status   Group A Strep by PCR NOT DETECTED NOT DETECTED Final    Comment: Performed at [facility], [address] [place], [place] [address]  Body fluid culture     Status: None  Collection Time: 09/15/18  5:13 PM  Result Value Ref Range Status   Specimen Description   Final    KNEE Performed at [facility], [address], [place], [place] [address]    Special Requests   Final    NONE Performed at [facility], [address] [place], [place] [address]    Gram Stain   Final    RARE RBCS FEW WBC SEEN NO ORGANISMS SEEN Performed at [facility], [address], [place], [place] [address]    Culture   Final    NO GROWTH 3 DAYS Performed at [facility], [place] [address], [place], [place] [address]    Report Status 09/19/2018 FINAL  Final  Body fluid culture     Status: None (Preliminary result)   Collection Time: 09/16/18  8:55 AM  Result Value Ref Range Status   Specimen Description KNEE RIGHT  Final   Special Requests NONE  Final   Gram Stain   Final    RARE WBC PRESENT, PREDOMINANTLY PMN NO ORGANISMS SEEN    Culture   Final     NO GROWTH 3 DAYS Performed at [facility], [place] [address] [place], [place] [address]    Report Status PENDING  Incomplete  Aerobic/Anaerobic Culture (surgical/deep wound)     Status: None (Preliminary result)   Collection Time: 09/17/18 11:32 AM  Result Value Ref Range Status   Specimen Description   Final    TISSUE LEFT KNEE 1 Performed at [facility], [address], [place], [place] [address]    Special Requests PATIENT ON FOLLOWING MAXIPIME VANCOMYCIN  Final   Gram Stain   Final    FEW WBC PRESENT,BOTH PMN AND MONONUCLEAR NO ORGANISMS SEEN    Culture   Final    NO GROWTH 2 DAYS NO ANAEROBES ISOLATED; CULTURE IN PROGRESS FOR 5 DAYS Performed at [facility], [place] [address] [place], [place] [address]    Report Status PENDING  Incomplete  Acid Fast Smear (AFB)     Status: None   Collection Time: 09/17/18 11:32 AM  Result Value Ref Range Status   AFB Specimen Processing Comment  Final    Comment: Tissue Grinding and Digestion/Decontamination   Acid Fast Smear Negative  Final    Comment: (NOTE) Performed At: [facility] [place], [place] [id number] Rush Farmer MD LK:[id number]    Source (AFB) TISSUE  Final    Comment: LEFT KNEE Performed at [facility], [place] [address], [place], [place] [address]   Aerobic/Anaerobic Culture (surgical/deep wound)     Status: None (Preliminary result)   Collection Time: 09/17/18 11:43 AM  Result Value Ref Range Status   Specimen Description   Final    TISSUE LEFT KNEE 2 Performed at [facility], [address], [place], [place] [address]    Special Requests PATIENT ON FOLLOWING MAXIPIME VANCOMYCIN  Final   Gram Stain   Final    FEW WBC PRESENT, PREDOMINANTLY PMN NO ORGANISMS SEEN    Culture   Final    NO GROWTH 2 DAYS NO ANAEROBES ISOLATED; CULTURE IN PROGRESS FOR 5 DAYS Performed at [facility], [place] [address], [place], [place] [address]    Report Status  PENDING  Incomplete  Aerobic/Anaerobic Culture (surgical/deep wound)     Status: None (Preliminary result)   Collection Time: 09/17/18 11:44 AM  Result Value Ref Range Status   Specimen  Description   Final    TISSUE LEFT KNEE 3 Performed at [facility], [address] [place], [place] [address]    Special Requests PATIENT ON FOLLOWING MAXIPIME VANCOMYCIN  Final   Gram Stain   Final    MODERATE WBC PRESENT, PREDOMINANTLY PMN NO ORGANISMS SEEN    Culture   Final    NO GROWTH 2 DAYS NO ANAEROBES ISOLATED; CULTURE IN PROGRESS FOR 5 DAYS Performed at [facility], [place] [address] [place], [place] [address]    Report Status PENDING  Incomplete  Aerobic/Anaerobic Culture (surgical/deep wound)     Status: None (Preliminary result)   Collection Time: 09/18/18  9:43 PM  Result Value Ref Range Status   Specimen Description   Final    SYNOVIAL Performed at [facility], [address], [place], [place] [address]    Special Requests   Final    NONE Performed at [facility], [address] [place], [place] [address]    Gram Stain   Final    RARE WBC PRESENT, PREDOMINANTLY MONONUCLEAR NO ORGANISMS SEEN Performed at [facility], [place] [address] [place], [place] [address]    Culture PENDING  Incomplete   Report Status PENDING  Incomplete  Aerobic/Anaerobic Culture (surgical/deep wound)     Status: None (Preliminary result)   Collection Time: 09/18/18  9:44 PM  Result Value Ref Range Status   Specimen Description   Final    SYNOVIAL Performed at [facility], [address], [place], [place] [address]    Special Requests   Final    RIGHT KNEE Performed at [facility], [address] [place], [place] [address]    Gram Stain   Final    FEW WBC PRESENT,BOTH PMN AND MONONUCLEAR NO ORGANISMS SEEN Performed at [facility], [place] [address] [place], [place] [address]    Culture PENDING  Incomplete   Report Status  PENDING  Incomplete  Aerobic/Anaerobic Culture (surgical/deep wound)     Status: None (Preliminary result)   Collection Time: 09/18/18  9:45 PM  Result Value Ref Range Status   Specimen Description   Final    SYNOVIAL Performed at [facility], [address], [place], [place] [address]    Special Requests   Final    RIGHT KNEE Performed at [facility], [address] [place],  [address]    Gram Stain   Final    FEW WBC PRESENT, PREDOMINANTLY MONONUCLEAR NO ORGANISMS SEEN Performed at [facility], [place] [address], [place], [place] [address]    Culture PENDING  Incomplete   Report Status PENDING  Incomplete        CBC    Component Value Date/Time   WBC 13.5 (H) 09/20/2018 0500   RBC 3.31 (L) 09/20/2018 0500   HGB 8.6 (L) 09/20/2018 0500   HCT 28.4 (L) 09/20/2018 0500   PLT 354 09/20/2018 0500   MCV 85.8 09/20/2018 0500   MCH 26.0 09/20/2018 0500   MCHC 30.3 09/20/2018 0500   RDW 15.7 (H) 09/20/2018 0500   LYMPHSABS 2.7 09/19/2018 0439   MONOABS 1.5 (H) 09/19/2018 0439   EOSABS 0.5 09/19/2018 0439   BASOSABS 0.1 09/19/2018 0439   BMP Latest Ref Rng & Units 09/20/2018 09/19/2018 09/18/2018  Glucose 70 - 99 mg/dL 174(H) 167(H) 186(H)  BUN 6 - 20 mg/dL 30(H) 22(H) 20  Creatinine 0.61 - 1.24 mg/dL 5.98(H) 3.42(H) 2.60(H)  BUN/Creat Ratio  6 - 22 (calc) - - -  Sodium 135 - 145 mmol/L 137 134(L) 136  Potassium 3.5 - 5.1 mmol/L 3.9 3.8 3.7  Chloride 98 - 111 mmol/L 107 101 102  CO2 22 - 32 mmol/L 19(L) 22 21(L)  Calcium 8.9 - 10.3 mg/dL 8.2(L) 8.2(L) 8.8(L)          ASSESSMENT AND PLAN SYNOPSIS  INFECTIOUS DISEASE -continue antibiotics as prescribed -follow up cultures -follow up ID consultation Severe sepsis b/l knee infections CULTURES NEG  SEVERE ALCOHOL WITHDRAWAL -Therapy with Thiamine and MVI -CIWA Protocol -Precedex as needed -High risk for intubation  -high risk for aspiration     ACUTE KIDNEY INJURY/Renal  Failure -follow chem 7 -follow UO -continue Foley Catheter-assess need -Avoid nephrotoxic agents -Recheck creatinine  -Renal ultrasound Will likely need HD     SHOCK-SEPSIS/HYPOVOLUMIC -use vasopressors to keep MAP>65 -follow ABG and LA -follow up cultures -emperic ABX -consider stress dose steroids   CARDIAC ICU monitoring    GI GI PROPHYLAXIS as indicated  NUTRITIONAL STATUS DIET-->NPO Constipation protocol as indicated  ENDO - will use ICU hypoglycemic\Hyperglycemia protocol if indicated   ELECTROLYTES -follow labs as needed -replace as needed -pharmacy consultation and following   DVT/GI PRX ordered TRANSFUSIONS AS NEEDED MONITOR FSBS ASSESS the need for LABS as needed   Critical Care Time devoted to patient care services described in this note is 32 minutes.   Overall, patient is critically ill, prognosis is guarded.    Corrin Parker, M.D.  [facility]  Medical Director [place] Director [facility] Cardio-Pulmonary Department

## 2018-09-20 NOTE — Progress Notes (Signed)
[place] [facility] [place] NAME: Isaac Cross    MR#:  [id number]  DATE OF BIRTH:  06-08-1960  SUBJECTIVE:  CHIEF COMPLAINT:   Chief Complaint  Patient presents with  . Dizziness   -  Appears critically ill, some myoclonic jerks, very confused - still spiking fevers -Renal function worsening  REVIEW OF SYSTEMS:  Review of Systems  Unable to perform ROS: Critical illness      DRUG ALLERGIES:   Allergies  Allergen Reactions  . Lipitor [Atorvastatin] Hives and Itching  . Other   . Viagra  [Sildenafil Citrate]     vision loss    VITALS:  Blood pressure (!) 115/56, pulse (!) 124, temperature 99.4 F (37.4 C), temperature source Oral, resp. rate (!) 32, height 6\' 4"  (1.93 m), weight (!) 145.2 kg, SpO2 99 %.  PHYSICAL EXAMINATION:  Physical Exam   GENERAL:  58 y.o.-year-old patient lying in the bed with no acute distress.  EYES: Pupils equal, round, reactive to light and accommodation. No scleral icterus. Extraocular muscles intact.  HEENT: Head atraumatic, normocephalic. Oropharynx and nasopharynx clear.  NECK:  Supple, no jugular venous distention. No thyroid enlargement, no tenderness.  LUNGS: Normal breath sounds bilaterally, no wheezing, rales,rhonchi or crepitation. No use of accessory muscles of respiration.  Decreased bibasilar breath sounds CARDIOVASCULAR: S1, S2 normal. No murmurs, rubs, or gallops.  ABDOMEN: Soft, nontender, nondistended. Bowel sounds present. No organomegaly or mass.  EXTREMITIES: Swelling of both knees noted with recent surgery, both knees in ice packing.  No  cyanosis, or clubbing.  myoclonic jerks noted NEUROLOGIC: Cranial nerves II through XII are intact.  Extremely confused.  Not following commands.  Myoclonic jerks noted.  Withdrawing to pain. PSYCHIATRIC: The patient is alert and oriented x2.  Intermittent confusion noted SKIN: No obvious rash, lesion, or ulcer.    LABORATORY PANEL:   CBC  Recent Labs  Lab 09/20/18 0500  WBC 13.5*  HGB 8.6*  HCT 28.4*  PLT 354   ------------------------------------------------------------------------------------------------------------------  Chemistries  Recent Labs  Lab 09/20/18 0500 09/20/18 0505  NA 137  --   K 3.9  --   CL 107  --   CO2 19*  --   GLUCOSE 174*  --   BUN 30*  --   CREATININE 5.98*  --   CALCIUM 8.2*  --   MG 2.0  --   AST  --  64*  ALT  --  35  ALKPHOS  --  67  BILITOT  --  0.7   ------------------------------------------------------------------------------------------------------------------  Cardiac Enzymes No results for input(s): TROPONINI in the last 168 hours. ------------------------------------------------------------------------------------------------------------------  RADIOLOGY:  Dg Chest Port 1 View  Result Date: 09/20/2018 CLINICAL DATA:  Fevers EXAM: PORTABLE CHEST 1 VIEW COMPARISON:  09/14/2018 FINDINGS: Cardiac shadow is stable. Previously seen right jugular central line is been removed. The lungs demonstrate mild bibasilar atelectasis right greater than left. No bony abnormality is seen. IMPRESSION: Mild bibasilar atelectasis. Electronically Signed   By: Inez Catalina M.D.   On: 09/20/2018 11:12   US Abdomen Limited Ruq  Result Date: 09/20/2018 CLINICAL DATA:  Liver cirrhosis.  Alcohol withdrawal. EXAM: ULTRASOUND ABDOMEN LIMITED RIGHT UPPER QUADRANT COMPARISON:  CT AP 04/30/2015 FINDINGS: Gallbladder: Gallbladder sludge noted. No gallstones, wall thickening or sonographic Murphy's sign. Common bile duct: Diameter: 5 mm Liver: The liver is diffusely echogenic compatible with hepatic steatosis. No focal liver lesion. Portal vein is patent on color Doppler imaging with normal direction  of blood flow towards the liver. IMPRESSION: 1. Echogenic liver compatible with hepatic steatosis. 2. Gallbladder sludge. Electronically Signed   By: Kerby Moors M.D.   On: 09/20/2018 12:15    EKG:    Orders placed or performed during the hospital encounter of 09/12/18  . EKG 12-Lead  . EKG 12-Lead    ASSESSMENT AND PLAN:    EdwardGradyis a58 y.o.malewith a known history of arthritis, COPD not on home oxygen, hypertension, sleep apnea, non-insulin-dependent diabetes mellitus presents to hospital secondary to dizziness, weakness and a fall  1.Sepsis-with ongoing fevers.  Still spiking as high as 103 F. -Recent bilateral knee replacement surgeries as outpatient and now bilateral knee washouts done.  Cultures are negative so far.  Fungal cultures are pending -Appreciate ID consult.  Patient on vancomycin and Zosyn will be added to cover for anaerobes. -Fever of unknown origin work-up recommended.  Will need a CT of his chest abdomen and pelvis when stable.  Blood cultures are negative.- urine cultures negative  2. Acute renal failure-likely ATN from sepsis -Much worsening creatinine and decreased urine output - appreciate nephrology input. -Patient getting more encephalopathic.  -Trial of IV fluids ordered today, if no improvement-consider hemodialysis -Hold lisinopril and metformin and other nephrotoxins.  Patient remains on vancomycin  3.  Acute encephalopathy-secondary to metabolic causes, sepsis and uremia.  Monitor closely.  No focal deficits  4. Hypertension-hold lisinopril.  5. Diabetes mellitus- sliding scale insulin.  -Hold metformin given worsening renal failure.  6. DVT prophylaxis- SCD  Physical therapy consult needed Patient is critically ill   All the records are reviewed and case discussed with Care Management/Social Workerr. Management plans discussed with the patient, family and they are in agreement.  CODE STATUS: Full code  TOTAL TIME TAKING CARE OF THIS PATIENT: 39 minutes.   POSSIBLE D/C IN 3-4 DAYS, DEPENDING ON CLINICAL CONDITION.   Gladstone Lighter M.D on 09/20/2018 at 12:30 PM  Between 7am to 6pm - Pager - [phone]  After  6pm go to [email or web address] - password [email or web address] [facility]  Office  [phone]  CC: Primary care physician; Steele Sizer, MD

## 2018-09-20 NOTE — Progress Notes (Signed)
Date of Admission:  09/12/2018   T     Subjective: Sedated with ativan/precedex febrile Medications:  . sodium chloride   Intravenous Once  . aspirin EC  81 mg Oral Daily  . Chlorhexidine Gluconate Cloth  6 each Topical Q0600  . citalopram  20 mg Oral Daily  . docusate sodium  100 mg Oral BID  . fluticasone furoate-vilanterol  1 puff Inhalation Daily  . folic acid  1 mg Oral Daily  . insulin aspart  0-15 Units Subcutaneous TID WC  . insulin aspart  0-5 Units Subcutaneous QHS  . insulin glargine  12 Units Subcutaneous QHS  . loratadine  10 mg Oral Daily  . mouth rinse  15 mL Mouth Rinse BID  . multivitamin with minerals  1 tablet Oral Daily  . thiamine  100 mg Oral Daily  . tranexamic acid (CYKLOKAPRON) topical -INTRAOP  2,000 mg Topical Once    Objective: Vital signs in last 24 hours: Patient Vitals for the past 24 hrs:  BP Temp Temp src Pulse Resp SpO2  09/20/18 1900 104/71 - - (!) 103 (!) 25 91 %  09/20/18 1830 111/60 - - (!) 106 (!) 22 98 %  09/20/18 1800 99/74 - - (!) 109 (!) 28 98 %  09/20/18 1730 (!) 72/50 - - (!) 105 (!) 38 (!) 89 %  09/20/18 1700 (!) 80/57 - - (!) 116 (!) 32 97 %  09/20/18 1630 (!) 83/54 - - (!) 118 (!) 37 96 %  09/20/18 1600 (!) 129/59 - - (!) 121 (!) 26 93 %  09/20/18 1530 110/61 99.7 F (37.6 C) Axillary (!) 121 (!) 31 92 %  09/20/18 1500 109/70 99.9 F (37.7 C) Oral (!) 121 (!) 29 93 %  09/20/18 1430 124/70 - - (!) 123 (!) 23 96 %  09/20/18 1400 (!) 112/58 - - (!) 122 (!) 29 95 %  09/20/18 1330 121/60 - - (!) 123 (!) 39 96 %  09/20/18 1300 - 99.8 F (37.7 C) Oral - - -  09/20/18 1230 (!) 119/50 - - (!) 125 (!) 28 97 %  09/20/18 1200 (!) 115/56 - - (!) 124 (!) 32 99 %  09/20/18 1130 112/64 - - (!) 127 (!) 34 98 %  09/20/18 1100 110/72 - - (!) 104 (!) 26 100 %  09/20/18 1030 (!) 111/59 - - (!) 123 (!) 27 98 %  09/20/18 0900 - - - - - 98 %  09/20/18 0830 94/68 - - (!) 127 20 100 %  09/20/18 0800 90/75 99.4 F (37.4 C) Oral (!) 124 (!)  26 91 %  09/20/18 0730 96/78 - - (!) 123 (!) 32 99 %  09/20/18 0643 - 100 F (37.8 C) - - - -  09/20/18 0630 104/70 - - (!) 123 (!) 33 100 %  09/20/18 0615 (!) 88/67 - - (!) 120 (!) 29 98 %  09/20/18 0600 109/60 - - (!) 122 (!) 24 99 %  09/20/18 0545 106/61 - - (!) 123 (!) 33 97 %  09/20/18 0530 99/60 (!) 101 F (38.3 C) - (!) 122 (!) 30 97 %  09/20/18 0515 111/64 - - (!) 124 (!) 26 99 %  09/20/18 0500 97/64 - - (!) 127 (!) 30 98 %  09/20/18 0445 (!) 96/58 - - (!) 127 (!) 28 100 %  09/20/18 0430 (!) 94/57 - - (!) 124 (!) 30 98 %  09/20/18 0415 (!) 110/58 (!) 101.5 F (38.6 C) Axillary Marland Kitchen)  123 (!) 31 99 %  09/20/18 0400 (!) 85/58 - - (!) 126 19 99 %  09/20/18 0345 (!) 88/58 - - (!) 125 (!) 28 97 %  09/20/18 0330 (!) 112/53 - - (!) 124 (!) 33 99 %  09/20/18 0315 (!) 97/54 - - (!) 123 (!) 30 96 %  09/20/18 0300 99/61 - - (!) 124 (!) 24 98 %  09/20/18 0245 (!) 92/55 - - (!) 126 (!) 29 97 %  09/20/18 0230 (!) 92/55 - - (!) 126 (!) 33 99 %  09/20/18 0215 (!) 94/56 - - (!) 124 (!) 28 99 %  09/20/18 0200 (!) 88/56 - - (!) 126 (!) 23 97 %  09/20/18 0145 (!) 86/53 - - (!) 125 17 96 %  09/20/18 0130 (!) 83/50 - - (!) 126 (!) 27 -  09/20/18 0115 (!) 83/51 - - (!) 127 17 96 %  09/20/18 0100 (!) 109/57 - - (!) 126 (!) 30 96 %  09/20/18 0045 (!) 93/55 (!) 102 F (38.9 C) - (!) 127 (!) 33 98 %  09/20/18 0030 (!) 104/49 - - (!) 127 (!) 28 96 %  09/20/18 0015 (!) 90/53 - - (!) 125 (!) 24 94 %  09/20/18 0000 (!) 83/49 - - (!) 125 (!) 35 95 %  09/19/18 2245 (!) 86/45 - - (!) 127 (!) 32 95 %  09/19/18 2215 - (!) 102 F (38.9 C) - - - -  09/19/18 2130 (!) 88/54 - - (!) 127 (!) 34 97 %  09/19/18 2115 (!) 76/46 - - (!) 129 (!) 32 97 %  09/19/18 2100 (!) 78/47 - - (!) 128 (!) 31 98 %  09/19/18 2030 (!) 94/53 - - (!) 125 (!) 32 96 %  09/19/18 2000 (!) 76/54 (!) 103 F (39.4 C) - (!) 129 (!) 30 98 %  09/19/18 1945 (!) 69/56 - - (!) 131 (!) 22 97 %   PHYSICAL EXAM:  General: sedated- on calling his  name he is trying to talk, jerking movts Head: Normocephalic, without obvious abnormality, atraumatic. Eyes: Conjunctivae clear, anicteric sclerae. Pupils are equal ENT cannot examine properly Some rigidity to his arms/legs Lungs: b/l air entry Heart: Tachycardia Abdomen: Soft,  Extremities: b/l knee surgeries- site okay edema both legs Skin: No rashes or lesions. Or bruising Lymph: Cervical, supraclavicular normal. Neurologic: obtunded  Lab Results Recent Labs    09/19/18 0439 09/20/18 0500  WBC 11.2* 13.5*  HGB 9.0* 8.6*  HCT 28.4* 28.4*  NA 134* 137  K 3.8 3.9  CL 101 107  CO2 22 19*  BUN 22* 30*  CREATININE 3.42* 5.98*   Liver Panel Recent Labs    09/19/18 0439  PROT 6.0*  ALBUMIN 1.9*  AST 70*  ALT 43  ALKPHOS 71  BILITOT 0.6   Sedimentation Rate Recent Labs    09/20/18 0500  ESRSEDRATE 106*   C-Reactive Protein No results for input(s): CRP in the last 72 hours.  Microbiology:  Studies/Results:   Assessment/Plan: 58 yr male had B/l knee replacements Sep 01, 2018 was admitted with fever, AKI . He was taking bactrim and keflex when he came to the Ed on 09/12/18  Initially fevers were persisting  inspite of IV vanco and cefepime and as there was no other source (Blood culture neg, urine culture neg, no pneumonia, ) the left knee was aspirated and it had 8000 wbc and he was taken for a wash out on 09/17/18. Rt knee was washed  out on 09/18/18 All cultures neg Pts fever was down for 36-48 hrs and started up again on 09/19/18 at 103. Central line, femoral line were removed Cefepime was changed to zosyn on 6/2  Also his creatinine which on admission was 3.71, then 4.3 was down to 2.46 on 5/31 and then started climbing up again and today is 5.98. Vanco was discontinued on 09/19/18 ( when it was 3.42)  Pts mental status has been worsening since the weekend after surgery/ anesthesia and it was found out yesterday that he consumes heavy alcohol- Dts were questioned  and he was started on ativan on 09/19/18. Fever - PJI of the left knee wa squestioned and he had wash out and cultures neg No UTI, no bacteremia, no pneumonia ?? Candidemia Today because of the high fever we sent fungal blood culture added anidulafungin and doxy and also getting CT of abd/pelvis and head Ultrasound legs showed Left popliteal vein DVT  Encephalopathy? DT? Drug reaction like NMS/serotonin syndrome Doubt he has any CNS infection as he was alert and appropriate until saturday  High cpk around 4500 initially  thought to be due to fall Declined to 717 and today increased to > 900. R/o NMS  Hypotension - septic shock VS other cause- on pressor- Intensivist added stress dose steroids  AKI- worsening and urine out put declining- nephrology on board.avoid nephrotoxic drugs  Discussed the management with care team and with his wife.

## 2018-09-20 NOTE — Progress Notes (Signed)
Patient with Left LEG DVT based on US findings prelim Report I have discussed case with DR Harlow Mares with Orthopedic surgery regarding use of anticoagulation in setting of recent knee reablements and wash out. Will proceed with Heparin infusion with his permission.

## 2018-09-20 NOTE — Progress Notes (Signed)
[facility]  ROUNDING NOTE   Subjective:    Intake/Output Summary (Last 24 hours) at 09/20/2018 0939 Last data filed at 09/20/2018 0451 Gross per 24 hour  Intake 2796.18 ml  Output 375 ml  Net 2421.18 ml   S Creatinine significantly higher at 3.42-> 6 Patient remains delirious this morning. Being treated for DTs Hypotensive   Objective:  Vital signs in last 24 hours:  Temp:  [98.3 F (36.8 C)-103 F (39.4 C)] 100 F (37.8 C) (06/03 0643) Pulse Rate:  [120-133] 123 (06/03 0630) Resp:  [16-36] 33 (06/03 0630) BP: (68-131)/(45-100) 104/70 (06/03 0630) SpO2:  [90 %-100 %] 100 % (06/03 0630)  Weight change:  Filed Weights   09/12/18 0935  Weight: (!) 145.2 kg    Intake/Output: I/O last 3 completed shifts: In: 3277.8 [P.O.:240; I.V.:1834.5; IV Piggyback:1203.3] Out: 660 [Urine:660]   Intake/Output this shift:  No intake/output data recorded.  Physical Exam: General: NAD, ill appearing  Head: Normocephalic, atraumatic. Dry oral mucosal membranes  Eyes: Anicteric,    Lungs:  Clear to auscultation  Heart: tachycardic  Abdomen:  Soft, nontender, obese, distended, decreased BS  Extremities:  ++ peripheral edema., b.l knee dressing  Neurologic: Alert and oriented  Skin: warm    Foley in place    Basic Metabolic Panel: Recent Labs  Lab 09/16/18 0459 09/17/18 0543 09/18/18 0517 09/19/18 0439 09/20/18 0500  NA 133* 135 136 134* 137  K 3.5 3.4* 3.7 3.8 3.9  CL 96* 98 102 101 107  CO2 23 22 21* 22 19*  GLUCOSE 197* 201* 186* 167* 174*  BUN 20 19 20  22* 30*  CREATININE 2.20* 2.46* 2.60* 3.42* 5.98*  CALCIUM 8.7* 9.1 8.8* 8.2* 8.2*  MG  --  1.5* 1.7 1.9 2.0  PHOS  --  4.5  --   --   --     Liver Function Tests: Recent Labs  Lab 09/15/18 0436 09/16/18 0459 09/19/18 0439  AST 148* 143* 70*  ALT 57* 59* 43  ALKPHOS 70 75 71  BILITOT 0.7 0.8 0.6  PROT 5.8* 6.2* 6.0*  ALBUMIN 1.9* 2.1* 1.9*   No results for input(s): LIPASE, AMYLASE in the  last 168 hours. No results for input(s): AMMONIA in the last 168 hours.  CBC: Recent Labs  Lab 09/14/18 0336 09/15/18 0436  09/15/18 2255 09/16/18 0459 09/17/18 0543 09/19/18 0439 09/20/18 0500  WBC 5.9 5.5  --   --  6.8 7.9 11.2* 13.5*  NEUTROABS 3.5 3.0  --   --  4.3  --  6.4  --   HGB 8.0* 7.5*   < > 9.4* 9.6* 9.5* 9.0* 8.6*  HCT 24.5* 23.2*   < > 28.4* 29.1* 29.3* 28.4* 28.4*  MCV 81.9 82.0  --   --  81.3 81.8 85.3 85.8  PLT 144* 158  --   --  193 227 269 354   < > = values in this interval not displayed.    Cardiac Enzymes: Recent Labs  Lab 09/19/18 1312  CKTOTAL 716*    BNP: Invalid input(s): POCBNP  CBG: Recent Labs  Lab 09/19/18 0803 09/19/18 1200 09/19/18 1812 09/19/18 2159 09/20/18 0733  GLUCAP 138* 207* 151* 154* 151*    Microbiology: Results for orders placed or performed during the hospital encounter of 09/12/18  Blood Culture (routine x 2)     Status: None   Collection Time: 09/12/18  9:56 AM  Result Value Ref Range Status   Specimen Description BLOOD LEFT HAND  Final   Special Requests   Final    BOTTLES DRAWN AEROBIC AND ANAEROBIC Blood Culture adequate volume   Culture   Final    NO GROWTH 5 DAYS Performed at [facility], [address] [place], [place] [address]    Report Status 09/17/2018 FINAL  Final  Urine culture     Status: None   Collection Time: 09/12/18 10:01 AM  Result Value Ref Range Status   Specimen Description   Final    URINE, RANDOM Performed at [facility], [address] [place], [place] [address]    Special Requests   Final    NONE Performed at [facility], [address], [place], [place] [address]    Culture   Final    NO GROWTH Performed at [facility], [place] [address] [place], [place] [address]    Report Status 09/13/2018 FINAL  Final  SARS Coronavirus 2 (CEPHEID- Performed in [place] hospital lab), Hosp Order     Status: None   Collection Time: 09/12/18 10:02  AM  Result Value Ref Range Status   SARS Coronavirus 2 NEGATIVE NEGATIVE Final    Comment: (NOTE) If result is NEGATIVE SARS-CoV-2 target nucleic acids are NOT DETECTED. The SARS-CoV-2 RNA is generally detectable in upper and lower  respiratory specimens during the acute phase of infection. The lowest  concentration of SARS-CoV-2 viral copies this assay can detect is 250  copies / mL. A negative result does not preclude SARS-CoV-2 infection  and should not be used as the sole basis for treatment or other  patient management decisions.  A negative result may occur with  improper specimen collection / handling, submission of specimen other  than nasopharyngeal swab, presence of viral mutation(s) within the  areas targeted by this assay, and inadequate number of viral copies  (<250 copies / mL). A negative result must be combined with clinical  observations, patient history, and epidemiological information. If result is POSITIVE SARS-CoV-2 target nucleic acids are DETECTED. The SARS-CoV-2 RNA is generally detectable in upper and lower  respiratory specimens dur ing the acute phase of infection.  Positive  results are indicative of active infection with SARS-CoV-2.  Clinical  correlation with patient history and other diagnostic information is  necessary to determine patient infection status.  Positive results do  not rule out bacterial infection or co-infection with other viruses. If result is PRESUMPTIVE POSTIVE SARS-CoV-2 nucleic acids MAY BE PRESENT.   A presumptive positive result was obtained on the submitted specimen  and confirmed on repeat testing.  While 2019 novel coronavirus  (SARS-CoV-2) nucleic acids may be present in the submitted sample  additional confirmatory testing may be necessary for epidemiological  and / or clinical management purposes  to differentiate between  SARS-CoV-2 and other Sarbecovirus currently known to infect humans.  If clinically indicated  additional testing with an alternate test  methodology [phone]) is advised. The SARS-CoV-2 RNA is generally  detectable in upper and lower respiratory sp ecimens during the acute  phase of infection. The expected result is Negative. Fact Sheet for Patients:  StrictlyIdeas.no Fact Sheet for Healthcare Providers: BankingDealers.co.za This test is not yet approved or cleared by the [place] FDA and has been authorized for detection and/or diagnosis of SARS-CoV-2 by FDA under an Emergency Use Authorization (EUA).  This EUA will remain in effect (meaning this test can be used) for the duration of the COVID-19 declaration under Section 564(b)(1) of the Act, 21 U.S.C.  section 360bbb-3(b)(1), unless the authorization is terminated or revoked sooner. Performed at [facility], [address] [place], [place] [address]   Blood Culture (routine x 2)     Status: None   Collection Time: 09/12/18 10:06 AM  Result Value Ref Range Status   Specimen Description BLOOD LEFT ANTECUBITAL  Final   Special Requests   Final    BOTTLES DRAWN AEROBIC AND ANAEROBIC Blood Culture adequate volume   Culture   Final    NO GROWTH 5 DAYS Performed at [facility], [address] [place], [place] [address]    Report Status 09/17/2018 FINAL  Final  MRSA PCR Screening     Status: None   Collection Time: 09/12/18  7:04 PM  Result Value Ref Range Status   MRSA by PCR NEGATIVE NEGATIVE Final    Comment:        The GeneXpert MRSA Assay (FDA approved for NASAL specimens only), is one component of a comprehensive MRSA colonization surveillance program. It is not intended to diagnose MRSA infection nor to guide or monitor treatment for MRSA infections. Performed at [facility], [address] [place], [place] [address]   C difficile quick scan w PCR reflex     Status: None   Collection Time: 09/14/18  2:21 PM  Result Value Ref  Range Status   C Diff antigen NEGATIVE NEGATIVE Final   C Diff toxin NEGATIVE NEGATIVE Final   C Diff interpretation No C. difficile detected.  Final    Comment: Performed at [facility], [address] [place], [place] [address]  Group A Strep by PCR     Status: None   Collection Time: 09/15/18  1:53 PM  Result Value Ref Range Status   Group A Strep by PCR NOT DETECTED NOT DETECTED Final    Comment: Performed at [facility], [address], [place], [place] [address]  Body fluid culture     Status: None   Collection Time: 09/15/18  5:13 PM  Result Value Ref Range Status   Specimen Description   Final    KNEE Performed at [facility], [address] [place], [place] [address]    Special Requests   Final    NONE Performed at [facility], [address], [place], [place] [address]    Gram Stain   Final    RARE RBCS FEW WBC SEEN NO ORGANISMS SEEN Performed at [facility], [address], [place], [place] [address]    Culture   Final    NO GROWTH 3 DAYS Performed at [facility], [place] [address], [place], [place] [address]    Report Status 09/19/2018 FINAL  Final  Body fluid culture     Status: None (Preliminary result)   Collection Time: 09/16/18  8:55 AM  Result Value Ref Range Status   Specimen Description KNEE RIGHT  Final   Special Requests NONE  Final   Gram Stain   Final    RARE WBC PRESENT, PREDOMINANTLY PMN NO ORGANISMS SEEN    Culture   Final    NO GROWTH 3 DAYS Performed at [facility], [place] [address], [place],  [address]    Report Status PENDING  Incomplete  Aerobic/Anaerobic Culture (surgical/deep wound)     Status: None (Preliminary result)   Collection Time: 09/17/18 11:32 AM  Result Value Ref Range Status   Specimen Description   Final    TISSUE LEFT KNEE  1 Performed at [facility], [address] [place], [place] [address]    Special Requests PATIENT ON FOLLOWING  MAXIPIME VANCOMYCIN  Final   Gram Stain   Final    FEW WBC PRESENT,BOTH PMN AND MONONUCLEAR NO ORGANISMS SEEN    Culture   Final    NO GROWTH 2 DAYS NO ANAEROBES ISOLATED; CULTURE IN PROGRESS FOR 5 DAYS Performed at [facility], [place] [address] [place], [place] [address]    Report Status PENDING  Incomplete  Acid Fast Smear (AFB)     Status: None   Collection Time: 09/17/18 11:32 AM  Result Value Ref Range Status   AFB Specimen Processing Comment  Final    Comment: Tissue Grinding and Digestion/Decontamination   Acid Fast Smear Negative  Final    Comment: (NOTE) Performed At: [facility] [place], [place] [id number] Rush Farmer MD CW:[id number]    Source (AFB) TISSUE  Final    Comment: LEFT KNEE Performed at [facility], [place] [address], [place], [place] [address]   Aerobic/Anaerobic Culture (surgical/deep wound)     Status: None (Preliminary result)   Collection Time: 09/17/18 11:43 AM  Result Value Ref Range Status   Specimen Description   Final    TISSUE LEFT KNEE 2 Performed at [facility], [address] [place], [place] [address]    Special Requests PATIENT ON FOLLOWING MAXIPIME VANCOMYCIN  Final   Gram Stain   Final    FEW WBC PRESENT, PREDOMINANTLY PMN NO ORGANISMS SEEN    Culture   Final    NO GROWTH 2 DAYS NO ANAEROBES ISOLATED; CULTURE IN PROGRESS FOR 5 DAYS Performed at [facility], [place] [address] [place], [place] [address]    Report Status PENDING  Incomplete  Aerobic/Anaerobic Culture (surgical/deep wound)     Status: None (Preliminary result)   Collection Time: 09/17/18 11:44 AM  Result Value Ref Range Status   Specimen Description   Final    TISSUE LEFT KNEE 3 Performed at [facility], [address] [place], [place] [address]    Special Requests PATIENT ON FOLLOWING MAXIPIME VANCOMYCIN  Final   Gram Stain   Final    MODERATE WBC PRESENT, PREDOMINANTLY PMN NO ORGANISMS SEEN     Culture   Final    NO GROWTH 2 DAYS NO ANAEROBES ISOLATED; CULTURE IN PROGRESS FOR 5 DAYS Performed at [facility], [place] [address] [place], [place] [address]    Report Status PENDING  Incomplete  Aerobic/Anaerobic Culture (surgical/deep wound)     Status: None (Preliminary result)   Collection Time: 09/18/18  9:43 PM  Result Value Ref Range Status   Specimen Description   Final    SYNOVIAL Performed at [facility], [address] [place], [place] [address]    Special Requests   Final    NONE Performed at [facility], [address] [place], [place] [address]    Gram Stain   Final    RARE WBC PRESENT, PREDOMINANTLY MONONUCLEAR NO ORGANISMS SEEN Performed at [facility], [place] [address] [place],  [address]    Culture PENDING  Incomplete   Report Status PENDING  Incomplete  Aerobic/Anaerobic Culture (surgical/deep wound)     Status: None (Preliminary result)   Collection Time: 09/18/18  9:44 PM  Result Value Ref Range Status   Specimen Description   Final    SYNOVIAL Performed at [facility], [address], [place], [place] [address]    Special Requests   Final    RIGHT KNEE Performed at [facility], [address] [place], [place] [address]    Gram Stain   Final    FEW WBC PRESENT,BOTH PMN AND MONONUCLEAR NO ORGANISMS SEEN Performed at [facility], [place] [address] [place], [place] [address]    Culture PENDING  Incomplete   Report Status PENDING  Incomplete  Aerobic/Anaerobic Culture (surgical/deep wound)     Status: None (Preliminary result)   Collection Time: 09/18/18  9:45 PM  Result Value Ref Range Status   Specimen Description   Final    SYNOVIAL Performed at [facility], [address] [place], [place] [address]    Special Requests   Final    RIGHT KNEE Performed at [facility], [address] [place] [place], [place] [address]    Gram Stain   Final    FEW WBC PRESENT,  PREDOMINANTLY MONONUCLEAR NO ORGANISMS SEEN Performed at [facility], [place] [address] [place], [place] [address]    Culture PENDING  Incomplete   Report Status PENDING  Incomplete  Culture, blood (Routine X 2) w Reflex to ID Panel     Status: None (Preliminary result)   Collection Time: 09/19/18  1:11 PM  Result Value Ref Range Status   Specimen Description BLOOD LEFT ANTECUBITAL  Final   Special Requests   Final    BOTTLES DRAWN AEROBIC AND ANAEROBIC Blood Culture results may not be optimal due to an excessive volume of blood received in culture bottles   Culture   Final    NO GROWTH < 24 HOURS Performed at [facility], [address], [place],  [address]    Report Status PENDING  Incomplete  Culture, blood (Routine X 2) w Reflex to ID Panel     Status: None (Preliminary result)   Collection Time: 09/19/18  1:20 PM  Result Value Ref Range Status   Specimen Description BLOOD BLOOD RIGHT HAND  Final   Special Requests   Final    BOTTLES DRAWN AEROBIC AND ANAEROBIC Blood Culture results may not be optimal due to an excessive volume of blood received in culture bottles   Culture   Final    NO GROWTH < 24 HOURS Performed at [facility], [place] [place],  [address]    Report Status PENDING  Incomplete    Coagulation Studies: No results for input(s): LABPROT, INR in the last 72 hours.  Urinalysis: Recent Labs    09/19/18 1234  COLORURINE AMBER*  LABSPEC 1.018  PHURINE 5.0  GLUCOSEU NEGATIVE  HGBUR LARGE*  BILIRUBINUR NEGATIVE  KETONESUR 5*  PROTEINUR 100*  NITRITE NEGATIVE  LEUKOCYTESUR NEGATIVE      Imaging: No results found.   Medications:   . sodium chloride 100 mL/hr at 09/20/18 0451  . sodium chloride    . albumin human    . norepinephrine (LEVOPHED) Adult infusion 7 mcg/min (09/20/18 0620)  . piperacillin-tazobactam (ZOSYN)  IV 3.375 g (09/20/18 0451)  . sodium chloride     . sodium chloride    Intravenous Once  . aspirin EC  81 mg Oral Daily  . Chlorhexidine Gluconate Cloth  6 each Topical Q0600  . citalopram  20 mg Oral Daily  . docusate sodium  100 mg Oral BID  . fluticasone furoate-vilanterol  1 puff Inhalation Daily  . folic acid  1 mg Oral Daily  .  insulin aspart  0-15 Units Subcutaneous TID WC  . insulin aspart  0-5 Units Subcutaneous QHS  . insulin glargine  12 Units Subcutaneous QHS  . loratadine  10 mg Oral Daily  . mouth rinse  15 mL Mouth Rinse BID  . multivitamin with minerals  1 tablet Oral Daily  . thiamine  100 mg Oral Daily  . tranexamic acid (CYKLOKAPRON) topical -INTRAOP  2,000 mg Topical Once   acetaminophen **OR** acetaminophen, albuterol, docusate sodium, HYDROcodone-acetaminophen, LORazepam, menthol-cetylpyridinium **OR** phenol, menthol-cetylpyridinium **OR** phenol, metoCLOPramide **OR** metoCLOPramide (REGLAN) injection, metoCLOPramide **OR** metoCLOPramide (REGLAN) injection, ondansetron **OR** ondansetron (ZOFRAN) IV, ondansetron **OR** ondansetron (ZOFRAN) IV, oxyCODONE  Assessment/ Plan:  Isaac Cross is a 58 y.o. black male with hyeprtension, COPD, diabetes mellitus type II who underwent bilateral knee replacement surgeries last week. who was admitted to [facility] on 09/12/2018 for sepsis  1. Acute renal failure with metabolic acidosis: baseline creatinine of 1.07 with normal GFR in 06/12/18.  Likely severe ATN from hypotension and sepsis - Hold nephrotoxic meds  Agree with maintenance IV fluids , bolus Iv Albumin support Electrolytes and Volume status are acceptable No acute indication for Dialysis at present but may need it if renal function does not improve   2. Septic shock/hypotension Requiring vasopressors -Recent bilateral knee replacement on Sep 01, 2018 at [facility]  -Suspected  knee septic arthritis, patient underwent I&D and polyethylene exchange on 5/31 and Rt knee on 6/1 -Currently being treated with cefepime and vancomycin  3.  Hyponatremia and peripheral edema: secondary to renal failure and hypervolemic volume status.  - monitor closely     LOS: 8 Harlynn Kimbell 6/3/20209:39 AM

## 2018-09-20 NOTE — Progress Notes (Signed)
Subjective:  Patient reports pain as moderate in bilateral knees.  Objective:   VITALS:   Vitals:   09/20/18 1330 09/20/18 1400 09/20/18 1430 09/20/18 1500  BP: 121/60 (!) 112/58 124/70 109/70  Pulse: (!) 123 (!) 122 (!) 123 (!) 121  Resp: (!) 39 (!) 29 (!) 23 (!) 29  Temp:    99.9 F (37.7 C)  TempSrc:    Oral  SpO2: 96% 95% 96% 93%  Weight:      Height:        PHYSICAL EXAM:  Sensation intact distally Intact pulses distally Dorsiflexion/Plantar flexion intact Incision: dressing C/D/I Compartment soft  LABS  Results for orders placed or performed during the hospital encounter of 09/12/18 (from the past 24 hour(s))  Glucose, capillary     Status: Abnormal   Collection Time: 09/19/18  6:12 PM  Result Value Ref Range   Glucose-Capillary 151 (H) 70 - 99 mg/dL  Glucose, capillary     Status: Abnormal   Collection Time: 09/19/18  9:59 PM  Result Value Ref Range   Glucose-Capillary 154 (H) 70 - 99 mg/dL  Vancomycin, random     Status: None   Collection Time: 09/20/18  5:00 AM  Result Value Ref Range   Vancomycin Rm 15   Basic metabolic panel     Status: Abnormal   Collection Time: 09/20/18  5:00 AM  Result Value Ref Range   Sodium 137 135 - 145 mmol/L   Potassium 3.9 3.5 - 5.1 mmol/L   Chloride 107 98 - 111 mmol/L   CO2 19 (L) 22 - 32 mmol/L   Glucose, Bld 174 (H) 70 - 99 mg/dL   BUN 30 (H) 6 - 20 mg/dL   Creatinine, Ser 5.98 (H) 0.61 - 1.24 mg/dL   Calcium 8.2 (L) 8.9 - 10.3 mg/dL   GFR calc non Af Amer 9 (L) >60 mL/min   GFR calc Af Amer 11 (L) >60 mL/min   Anion gap 11 5 - 15  Sedimentation rate     Status: Abnormal   Collection Time: 09/20/18  5:00 AM  Result Value Ref Range   Sed Rate 106 (H) 0 - 20 mm/hr  Magnesium     Status: None   Collection Time: 09/20/18  5:00 AM  Result Value Ref Range   Magnesium 2.0 1.7 - 2.4 mg/dL  CBC     Status: Abnormal   Collection Time: 09/20/18  5:00 AM  Result Value Ref Range   WBC 13.5 (H) 4.0 - 10.5 K/uL   RBC  3.31 (L) 4.22 - 5.81 MIL/uL   Hemoglobin 8.6 (L) 13.0 - 17.0 g/dL   HCT 28.4 (L) 39.0 - 52.0 %   MCV 85.8 80.0 - 100.0 fL   MCH 26.0 26.0 - 34.0 pg   MCHC 30.3 30.0 - 36.0 g/dL   RDW 15.7 (H) 11.5 - 15.5 %   Platelets 354 150 - 400 K/uL   nRBC 0.0 0.0 - 0.2 %  Hepatic function panel     Status: Abnormal   Collection Time: 09/20/18  5:05 AM  Result Value Ref Range   Total Protein 6.1 (L) 6.5 - 8.1 g/dL   Albumin 1.8 (L) 3.5 - 5.0 g/dL   AST 64 (H) 15 - 41 U/L   ALT 35 0 - 44 U/L   Alkaline Phosphatase 67 38 - 126 U/L   Total Bilirubin 0.7 0.3 - 1.2 mg/dL   Bilirubin, Direct 0.2 0.0 - 0.2 mg/dL   Indirect Bilirubin 0.5  0.3 - 0.9 mg/dL  Lactate dehydrogenase     Status: Abnormal   Collection Time: 09/20/18  5:05 AM  Result Value Ref Range   LDH 406 (H) 98 - 192 U/L  Glucose, capillary     Status: Abnormal   Collection Time: 09/20/18  7:33 AM  Result Value Ref Range   Glucose-Capillary 151 (H) 70 - 99 mg/dL  Ammonia     Status: None   Collection Time: 09/20/18  9:48 AM  Result Value Ref Range   Ammonia 12 9 - 35 umol/L  CK     Status: Abnormal   Collection Time: 09/20/18  9:48 AM  Result Value Ref Range   Total CK 992 (H) 49 - 397 U/L  Glucose, capillary     Status: Abnormal   Collection Time: 09/20/18 11:44 AM  Result Value Ref Range   Glucose-Capillary 149 (H) 70 - 99 mg/dL    Dg Chest Port 1 View  Result Date: 09/20/2018 CLINICAL DATA:  Fevers EXAM: PORTABLE CHEST 1 VIEW COMPARISON:  09/14/2018 FINDINGS: Cardiac shadow is stable. Previously seen right jugular central line is been removed. The lungs demonstrate mild bibasilar atelectasis right greater than left. No bony abnormality is seen. IMPRESSION: Mild bibasilar atelectasis. Electronically Signed   By: Inez Catalina M.D.   On: 09/20/2018 11:12   US Abdomen Limited Ruq  Result Date: 09/20/2018 CLINICAL DATA:  Liver cirrhosis.  Alcohol withdrawal. EXAM: ULTRASOUND ABDOMEN LIMITED RIGHT UPPER QUADRANT COMPARISON:  CT AP  04/30/2015 FINDINGS: Gallbladder: Gallbladder sludge noted. No gallstones, wall thickening or sonographic Murphy's sign. Common bile duct: Diameter: 5 mm Liver: The liver is diffusely echogenic compatible with hepatic steatosis. No focal liver lesion. Portal vein is patent on color Doppler imaging with normal direction of blood flow towards the liver. IMPRESSION: 1. Echogenic liver compatible with hepatic steatosis. 2. Gallbladder sludge. Electronically Signed   By: Kerby Moors M.D.   On: 09/20/2018 12:15    Assessment/Plan: 2 Days Post-Op   Active Problems:   Sepsis ([place])   Acute renal failure ([place])   Septic shock ([place])   Patient condition is worsening despite antibiotics and bilateral washout. Discussed care with team and may require FUO workup. Will continue to follow knee exam and cultures closely.   Lovell Sheehan , MD 09/20/2018, 3:17 PM

## 2018-09-20 NOTE — Progress Notes (Signed)
Pharmacy Antibiotic Note  Isaac Cross is a 58 y.o. male admitted on 09/12/2018 with septic arthritis.  Pharmacy was consulted for Zosyn dosing. Patient s/p bilateral TKR 09/01/2018 CTX x 1 and Cipro x 1 in ER. His SCr remains well elevated above his baseline of 1.07 mg/dL and is still up. He was evaluated by nephrology this morning but HD is being deferred for now. Status post left knee washout. Follow-up cultures. Cefepime and vancomycin were stopped yesterday over concern for neurotoxicity in the setting of worsening renal function. Daptomycin was considered as anti-MRSA therapy but CK remains elevated, therefore doxycycline was started along with anidulafungin today by infectious diseasee  Plan: --continue Zosyn 3.375gm IV over 4h infusion: this dose is borderline for his current renal function and may need to be adjusted lower tomorrow if there is further deterioration  Height: 6\' 4"  (193 cm) Weight: (!) 320 lb (145.2 kg) IBW/kg (Calculated) : 86.8  Temp (24hrs), Avg:101 F (38.3 C), Min:98.3 F (36.8 C), Max:103 F (39.4 C)  Recent Labs  Lab 09/15/18 0436 09/16/18 0459 09/16/18 1610 09/17/18 0543 09/18/18 0517 09/18/18 1019 09/19/18 0439 09/20/18 0500  WBC 5.5 6.8  --  7.9  --   --  11.2* 13.5*  CREATININE 2.82* 2.20*  --  2.46* 2.60*  --  3.42* 5.98*  LATICACIDVEN  --   --   --   --   --   --  1.0  --   VANCOPEAK  --   --  22*  --   --   --   --   --   VANCORANDOM  --   --   --   --   --  11  --  15    Estimated Creatinine Clearance: 20.5 mL/min (A) (by C-G formula based on SCr of 5.98 mg/dL (H)).    Antimicrobials this admission: Zosyn 6/2 >> anidulafungin 6/3 >> doxycycline 6/3 >> cipro 5/26 >> 5/26 CTX 5/26 >>  5/26 Vanc 5/26 >> 6/2 Cefepime 5/26 >> 6/2   Microbiology results: 6/1 WCx x3: pending 5/26 MRSA PCR: neg 5/26 BCx: NG 5/26 UCx: NG  Thank you for allowing pharmacy to be a part of this patient's care.  Vallery Sa, PharmD 09/20/2018 11:54 AM

## 2018-09-20 NOTE — Progress Notes (Signed)
Subjective:  Patient reports pain as mild.  Follow commands but is confused.  Objective:   VITALS:   Vitals:   09/20/18 0600 09/20/18 0615 09/20/18 0630 09/20/18 0643  BP: 109/60 (!) 88/67 104/70   Pulse: (!) 122 (!) 120 (!) 123   Resp: (!) 24 (!) 29 (!) 33   Temp:    100 F (37.8 C)  TempSrc:      SpO2: 99% 98% 100%   Weight:      Height:        PHYSICAL EXAM:  Neurovascular intact Dorsiflexion/Plantar flexion intact Incision: dressing C/D/I Compartment soft  LABS  Results for orders placed or performed during the hospital encounter of 09/12/18 (from the past 24 hour(s))  Glucose, capillary     Status: Abnormal   Collection Time: 09/19/18 12:00 PM  Result Value Ref Range   Glucose-Capillary 207 (H) 70 - 99 mg/dL  Urinalysis, Routine w reflex microscopic     Status: Abnormal   Collection Time: 09/19/18 12:34 PM  Result Value Ref Range   Color, Urine AMBER (A) YELLOW   APPearance TURBID (A) CLEAR   Specific Gravity, Urine 1.018 1.005 - 1.030   pH 5.0 5.0 - 8.0   Glucose, UA NEGATIVE NEGATIVE mg/dL   Hgb urine dipstick LARGE (A) NEGATIVE   Bilirubin Urine NEGATIVE NEGATIVE   Ketones, ur 5 (A) NEGATIVE mg/dL   Protein, ur 100 (A) NEGATIVE mg/dL   Nitrite NEGATIVE NEGATIVE   Leukocytes,Ua NEGATIVE NEGATIVE   RBC / HPF 6-10 0 - 5 RBC/hpf   WBC, UA 0-5 0 - 5 WBC/hpf   Bacteria, UA NONE SEEN NONE SEEN   Squamous Epithelial / LPF NONE SEEN 0 - 5  Ferritin     Status: Abnormal   Collection Time: 09/19/18  1:07 PM  Result Value Ref Range   Ferritin 1,738 (H) 24 - 336 ng/mL  Culture, blood (Routine X 2) w Reflex to ID Panel     Status: None (Preliminary result)   Collection Time: 09/19/18  1:11 PM  Result Value Ref Range   Specimen Description BLOOD LEFT ANTECUBITAL    Special Requests      BOTTLES DRAWN AEROBIC AND ANAEROBIC Blood Culture results may not be optimal due to an excessive volume of blood received in culture bottles   Culture      NO GROWTH < 24  HOURS Performed at [facility], [address] [place], [place] [address]    Report Status PENDING   CK     Status: Abnormal   Collection Time: 09/19/18  1:12 PM  Result Value Ref Range   Total CK 716 (H) 49 - 397 U/L  Culture, blood (Routine X 2) w Reflex to ID Panel     Status: None (Preliminary result)   Collection Time: 09/19/18  1:20 PM  Result Value Ref Range   Specimen Description BLOOD BLOOD RIGHT HAND    Special Requests      BOTTLES DRAWN AEROBIC AND ANAEROBIC Blood Culture results may not be optimal due to an excessive volume of blood received in culture bottles   Culture      NO GROWTH < 24 HOURS Performed at [facility], [address] [place], [place] [address]    Report Status PENDING   Glucose, capillary     Status: Abnormal   Collection Time: 09/19/18  6:12 PM  Result Value Ref Range   Glucose-Capillary 151 (H) 70 - 99 mg/dL  Glucose, capillary  Status: Abnormal   Collection Time: 09/19/18  9:59 PM  Result Value Ref Range   Glucose-Capillary 154 (H) 70 - 99 mg/dL  Vancomycin, random     Status: None   Collection Time: 09/20/18  5:00 AM  Result Value Ref Range   Vancomycin Rm 15   Basic metabolic panel     Status: Abnormal   Collection Time: 09/20/18  5:00 AM  Result Value Ref Range   Sodium 137 135 - 145 mmol/L   Potassium 3.9 3.5 - 5.1 mmol/L   Chloride 107 98 - 111 mmol/L   CO2 19 (L) 22 - 32 mmol/L   Glucose, Bld 174 (H) 70 - 99 mg/dL   BUN 30 (H) 6 - 20 mg/dL   Creatinine, Ser 5.98 (H) 0.61 - 1.24 mg/dL   Calcium 8.2 (L) 8.9 - 10.3 mg/dL   GFR calc non Af Amer 9 (L) >60 mL/min   GFR calc Af Amer 11 (L) >60 mL/min   Anion gap 11 5 - 15  Sedimentation rate     Status: Abnormal   Collection Time: 09/20/18  5:00 AM  Result Value Ref Range   Sed Rate 106 (H) 0 - 20 mm/hr  Magnesium     Status: None   Collection Time: 09/20/18  5:00 AM  Result Value Ref Range   Magnesium 2.0 1.7 - 2.4 mg/dL  CBC     Status: Abnormal    Collection Time: 09/20/18  5:00 AM  Result Value Ref Range   WBC 13.5 (H) 4.0 - 10.5 K/uL   RBC 3.31 (L) 4.22 - 5.81 MIL/uL   Hemoglobin 8.6 (L) 13.0 - 17.0 g/dL   HCT 28.4 (L) 39.0 - 52.0 %   MCV 85.8 80.0 - 100.0 fL   MCH 26.0 26.0 - 34.0 pg   MCHC 30.3 30.0 - 36.0 g/dL   RDW 15.7 (H) 11.5 - 15.5 %   Platelets 354 150 - 400 K/uL   nRBC 0.0 0.0 - 0.2 %  Glucose, capillary     Status: Abnormal   Collection Time: 09/20/18  7:33 AM  Result Value Ref Range   Glucose-Capillary 151 (H) 70 - 99 mg/dL    No results found.  Assessment/Plan: 2 Days Post-Op bilateral knee irrigation, debridement and polyethylene liner exchange  Active Problems:   Sepsis ([place])   Acute renal failure ([place])   Septic shock ([place])   Patient condition worsening and spiking fevers despite antibiotic therapy and bilateral knee irrigation and debridement. No positive cultures to date. No organism isolated. Will continue to follow knee exam closely, incisions are healing well. Patient my weight bear as tolerated and participate with therapy once his condition is improved. He is critically ill.   Lovell Sheehan , MD 09/20/2018, 8:36 AM

## 2018-09-20 NOTE — Progress Notes (Signed)
PT Cancellation Note  Patient Details Name: Isaac Cross MRN: [id number] DOB: May 10, 1960   Cancelled Treatment:    Reason Eval/Treat Not Completed: Patient not medically ready.  Per MD Kalisetti's note from today "Appears critically ill, some myoclonic jerks, very confused", "still spiking fevers", and "Renal function worsening".  D/t medical concerns/status, will hold PT at this time and re-attempt PT re-evaluation at a later date/time as medically appropriate.  Leitha Bleak, PT 09/20/18, 1:45 PM [phone]

## 2018-09-20 NOTE — Addendum Note (Signed)
Addendum  created 09/20/18 1155 by Doreen Salvage, CRNA   Charge Capture section accepted

## 2018-09-20 NOTE — Progress Notes (Signed)
Polar ice emptied, and refilled, working properly.

## 2018-09-20 NOTE — Progress Notes (Signed)
[place] for heparin drip management Indication: DVT  Allergies  Allergen Reactions  . Lipitor [Atorvastatin] Hives and Itching  . Other   . Viagra  [Sildenafil Citrate]     vision loss    Patient Measurements: Height: 6\' 4"  (193 cm) Weight: (!) 320 lb (145.2 kg) IBW/kg (Calculated) : 86.8 Heparin Dosing Weight: 120 kg  Vital Signs: Temp: 99.1 F (37.3 C) (06/03 2000) Temp Source: Axillary (06/03 2000) BP: 94/60 (06/03 2100) Pulse Rate: 108 (06/03 2100)  Labs: Recent Labs    09/18/18 0517  09/19/18 0439 09/19/18 1312 09/20/18 0500 09/20/18 0948 09/20/18 1811 09/20/18 1924  HGB  --    < > 9.0*  --  8.6*  --   --  8.4*  HCT  --   --  28.4*  --  28.4*  --   --  27.3*  PLT  --   --  269  --  354  --   --   --   APTT  --   --   --   --   --   --  35  --   HEPARINUNFRC  --   --   --   --   --   --  <0.10*  --   CREATININE 2.60*  --  3.42*  --  5.98*  --   --   --   CKTOTAL  --   --   --  716*  --  992*  --   --    < > = values in this interval not displayed.    Estimated Creatinine Clearance: 20.5 mL/min (A) (by C-G formula based on SCr of 5.98 mg/dL (H)).   Medical History: Past Medical History:  Diagnosis Date  . Allergy   . Anxiety   . Arthritis    knees  . Asthma, mild intermittent, well-controlled   . Chronic obstructive asthma ([place])   . COPD (chronic obstructive pulmonary disease) ([place])   . Depression with anxiety   . Diabetes ([place])   . GERD (gastroesophageal reflux disease)   . HBP (high blood pressure)   . High cholesterol   . Renal cyst, right   . Sleep apnea    CPAP  . Swelling     Medications:  Scheduled:  . sodium chloride   Intravenous Once  . aspirin EC  81 mg Oral Daily  . Chlorhexidine Gluconate Cloth  6 each Topical Q0600  . docusate sodium  100 mg Oral BID  . fluticasone furoate-vilanterol  1 puff Inhalation Daily  . folic acid  1 mg Oral Daily  . hydrocortisone sod succinate (SOLU-CORTEF)  inj  50 mg Intravenous Q6H  . insulin aspart  0-15 Units Subcutaneous TID WC  . insulin aspart  0-5 Units Subcutaneous QHS  . insulin glargine  12 Units Subcutaneous QHS  . loratadine  10 mg Oral Daily  . mouth rinse  15 mL Mouth Rinse BID  . multivitamin with minerals  1 tablet Oral Daily  . thiamine  100 mg Oral Daily  . tranexamic acid (CYKLOKAPRON) topical -INTRAOP  2,000 mg Topical Once   Infusions:  . sodium chloride 100 mL/hr at 09/20/18 2000  . [START ON 09/21/2018] anidulafungin    . dexmedetomidine (PRECEDEX) IV infusion 0.4 mcg/kg/hr (09/20/18 1836)  . doxycycline (VIBRAMYCIN) IV 100 mg (09/20/18 1214)  . heparin 1,900 Units/hr (09/20/18 2000)  . norepinephrine (LEVOPHED) Adult infusion Stopped (09/20/18 1953)  . piperacillin-tazobactam (ZOSYN)  IV  3.375 g (09/20/18 2125)    Assessment: Pharmacy consulted for heparin drip management for 58 yo male being treated with heparin for left popliteal vein DVT. Patient with bilateral knee replacement on 5/15. Patient admitted to [facility] on 5/26 with weakness and pain. Patient underwent I&D of left knee on 5/31 and right knee on 6/1. Patient prior to admission was on rivaroxaban 10mg . This was not continued in house and anti-Xa level was <0.1.   Goal of Therapy:  Heparin level 0.3-0.7 units/ml Monitor platelets by anticoagulation protocol: Yes   Plan:  Per discussion with ICU team, will initiate heparin drip at 1900 units/hr with no bolus. Will obtain anti-Xa level at 0200. Patient is to have serial hematocrit and hemoglobins as ordered by ICU team.   Pharmacy will continue to monitor and adjust per consult.   Simpson,Michael L 09/20/2018,9:46 PM

## 2018-09-20 NOTE — Consult Note (Signed)
PHARMACY CONSULT NOTE - FOLLOW UP  Pharmacy Consult for Electrolyte Monitoring and Replacement   Recent Labs: Potassium (mmol/L)  Date Value  09/20/2018 3.9   Magnesium (mg/dL)  Date Value  09/20/2018 2.0   Calcium (mg/dL)  Date Value  09/20/2018 8.2 (L)   Albumin (g/dL)  Date Value  09/19/2018 1.9 (L)  06/17/2015 4.7   Phosphorus (mg/dL)  Date Value  09/17/2018 4.5   Sodium (mmol/L)  Date Value  09/20/2018 137  06/17/2015 137   Assessment: 58 y.o.malewith a history of arthritis, COPD not on home oxygen, hypertension, sleep apnea, non-insulin-dependent diabetes mellitus presents to hospital secondary to dizziness, weakness and a fall  Plan:  --keep magnesium ~2: no supplementation required --renal function deteriorated sharply overnight: potassium ~ 4.0; no supplementation indicated today --F/U labs in am and replace as needed  Dallie Piles ,PharmD Clinical Pharmacist 09/20/2018 8:14 AM

## 2018-09-21 ENCOUNTER — Inpatient Hospital Stay: Payer: BC Managed Care – PPO

## 2018-09-21 DIAGNOSIS — N170 Acute kidney failure with tubular necrosis: Secondary | ICD-10-CM

## 2018-09-21 LAB — GASTROINTESTINAL PANEL BY PCR, STOOL (REPLACES STOOL CULTURE)

## 2018-09-21 LAB — BASIC METABOLIC PANEL WITH GFR
Anion gap: 11 (ref 5–15)
BUN: 37 mg/dL — ABNORMAL HIGH (ref 6–20)
CO2: 17 mmol/L — ABNORMAL LOW (ref 22–32)
Calcium: 8.3 mg/dL — ABNORMAL LOW (ref 8.9–10.3)
Chloride: 111 mmol/L (ref 98–111)
Creatinine, Ser: 7.73 mg/dL — ABNORMAL HIGH (ref 0.61–1.24)
GFR calc Af Amer: 8 mL/min — ABNORMAL LOW (ref >60)
GFR calc non Af Amer: 7 mL/min — ABNORMAL LOW (ref >60)
Glucose, Bld: 192 mg/dL — ABNORMAL HIGH (ref 70–99)
Potassium: 4.3 mmol/L (ref 3.5–5.1)
Sodium: 139 mmol/L (ref 135–145)

## 2018-09-21 LAB — CBC WITH DIFFERENTIAL/PLATELET
Abs Immature Granulocytes: 0.12 10*3/uL — ABNORMAL HIGH (ref 0.00–0.07)
Basophils Absolute: 0 10*3/uL (ref 0.0–0.1)
Basophils Relative: 0 %
Eosinophils Absolute: 0.3 10*3/uL (ref 0.0–0.5)
Eosinophils Relative: 3 %
HCT: 28 % — ABNORMAL LOW (ref 39.0–52.0)
Hemoglobin: 8.7 g/dL — ABNORMAL LOW (ref 13.0–17.0)
Immature Granulocytes: 1 %
Lymphocytes Relative: 13 %
Lymphs Abs: 1.4 10*3/uL (ref 0.7–4.0)
MCH: 26.8 pg (ref 26.0–34.0)
MCHC: 31.1 g/dL (ref 30.0–36.0)
MCV: 86.2 fL (ref 80.0–100.0)
Monocytes Absolute: 1.2 10*3/uL — ABNORMAL HIGH (ref 0.1–1.0)
Monocytes Relative: 11 %
Neutro Abs: 8 10*3/uL — ABNORMAL HIGH (ref 1.7–7.7)
Neutrophils Relative %: 72 %
Platelets: 276 10*3/uL (ref 150–400)
RBC: 3.25 MIL/uL — ABNORMAL LOW (ref 4.22–5.81)
RDW: 15.7 % — ABNORMAL HIGH (ref 11.5–15.5)
WBC: 11.1 10*3/uL — ABNORMAL HIGH (ref 4.0–10.5)
nRBC: 0 % (ref 0.0–0.2)

## 2018-09-21 LAB — HEPATITIS PANEL, ACUTE
HCV Ab: 0.2 s/co ratio (ref 0.0–0.9)
Hep A IgM: NEGATIVE
Hep B C IgM: NEGATIVE
Hepatitis B Surface Ag: NEGATIVE

## 2018-09-21 LAB — GLUCOSE, CAPILLARY
Glucose-Capillary: 198 mg/dL — ABNORMAL HIGH (ref 70–99)
Glucose-Capillary: 206 mg/dL — ABNORMAL HIGH (ref 70–99)
Glucose-Capillary: 213 mg/dL — ABNORMAL HIGH (ref 70–99)
Glucose-Capillary: 256 mg/dL — ABNORMAL HIGH (ref 70–99)

## 2018-09-21 LAB — ACID FAST CULTURE WITH REFLEXED SENSITIVITIES (MYCOBACTERIA)

## 2018-09-21 LAB — HEMOGLOBIN AND HEMATOCRIT, BLOOD
HCT: 26.5 % — ABNORMAL LOW (ref 39.0–52.0)
HCT: 24.8 % — ABNORMAL LOW (ref 39.0–52.0)
Hemoglobin: 8.2 g/dL — ABNORMAL LOW (ref 13.0–17.0)
Hemoglobin: 7.8 g/dL — ABNORMAL LOW (ref 13.0–17.0)

## 2018-09-21 LAB — NOROVIRUS GROUP 1 & 2 BY PCR, STOOL
Norovirus 1 by PCR: NEGATIVE
Norovirus 2  by PCR: NEGATIVE

## 2018-09-21 LAB — ECHOCARDIOGRAM COMPLETE
Height: 76 in
Weight: 5120 oz

## 2018-09-21 LAB — C DIFFICILE QUICK SCREEN W PCR REFLEX
C Diff antigen: NEGATIVE
C Diff interpretation: NOT DETECTED
C Diff toxin: NEGATIVE

## 2018-09-21 LAB — HIV ANTIBODY (ROUTINE TESTING W REFLEX): HIV Screen 4th Generation wRfx: NONREACTIVE

## 2018-09-21 LAB — MAGNESIUM: Magnesium: 2.4 mg/dL (ref 1.7–2.4)

## 2018-09-21 LAB — HEPARIN LEVEL (UNFRACTIONATED)
Heparin Unfractionated: <0.1 IU/mL — ABNORMAL LOW (ref 0.30–0.70)
Heparin Unfractionated: <0.1 IU/mL — ABNORMAL LOW (ref 0.30–0.70)
Heparin Unfractionated: 0.12 IU/mL — ABNORMAL LOW (ref 0.30–0.70)
Heparin Unfractionated: 0.16 IU/mL — ABNORMAL LOW (ref 0.30–0.70)

## 2018-09-21 LAB — PROCALCITONIN: Procalcitonin: 4.78 ng/mL

## 2018-09-21 LAB — HSV(HERPES SIMPLEX VRS) I + II AB-IGM: HSVI/II Comb IgM: <0.91 Ratio (ref 0.00–0.90)

## 2018-09-21 LAB — URINE CULTURE: Culture: NO GROWTH

## 2018-09-21 IMAGING — DX PORTABLE CHEST - 1 VIEW
1 series · 1 of 1 positions shown · non-contrast
Comparison: [DATE]

CLINICAL DATA: Central line placement

EXAM:
PORTABLE CHEST 1 VIEW

[chest ap]
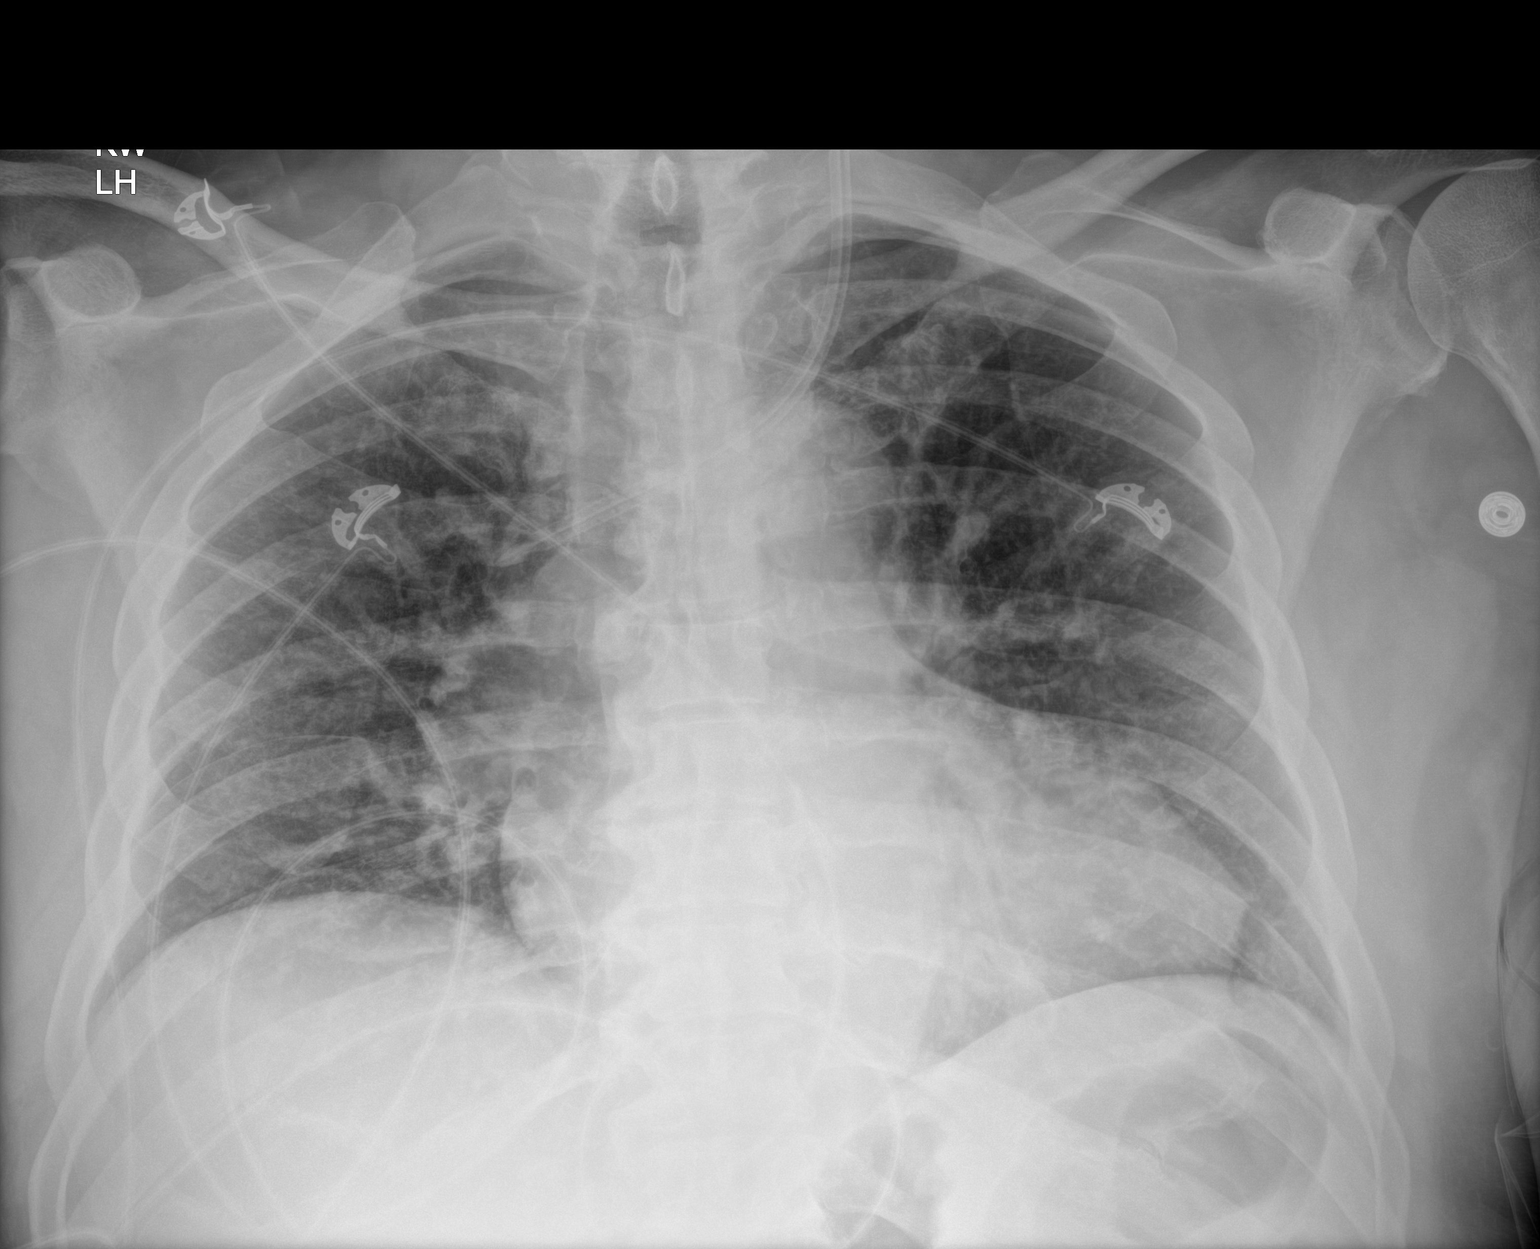

[1 of 1 positions shown; findings below may reference images not displayed]

FINDINGS: Interval placement of a left neck multi lumen vascular catheter, tip
projecting in the vicinity of the brachiocephalic confluence.
Otherwise unchanged low volume AP portable examination with
cardiomegaly. No acute appearing airspace disease.
IMPRESSION: Interval placement of a left neck multi lumen vascular catheter, tip
projecting in the vicinity of the brachiocephalic confluence.
Otherwise unchanged low volume AP portable examination with
cardiomegaly. No acute appearing airspace disease.

## 2018-09-21 IMAGING — DX PORTABLE CHEST - 1 VIEW
1 series · 2 of 2 positions shown · non-contrast
Comparison: [DATE]

CLINICAL DATA: Acute respiratory failure

EXAM:
PORTABLE CHEST 1 VIEW

[Series 1: chest ap · 0.14mm/px · 2 of 2 slices shown]
[im 1/2]
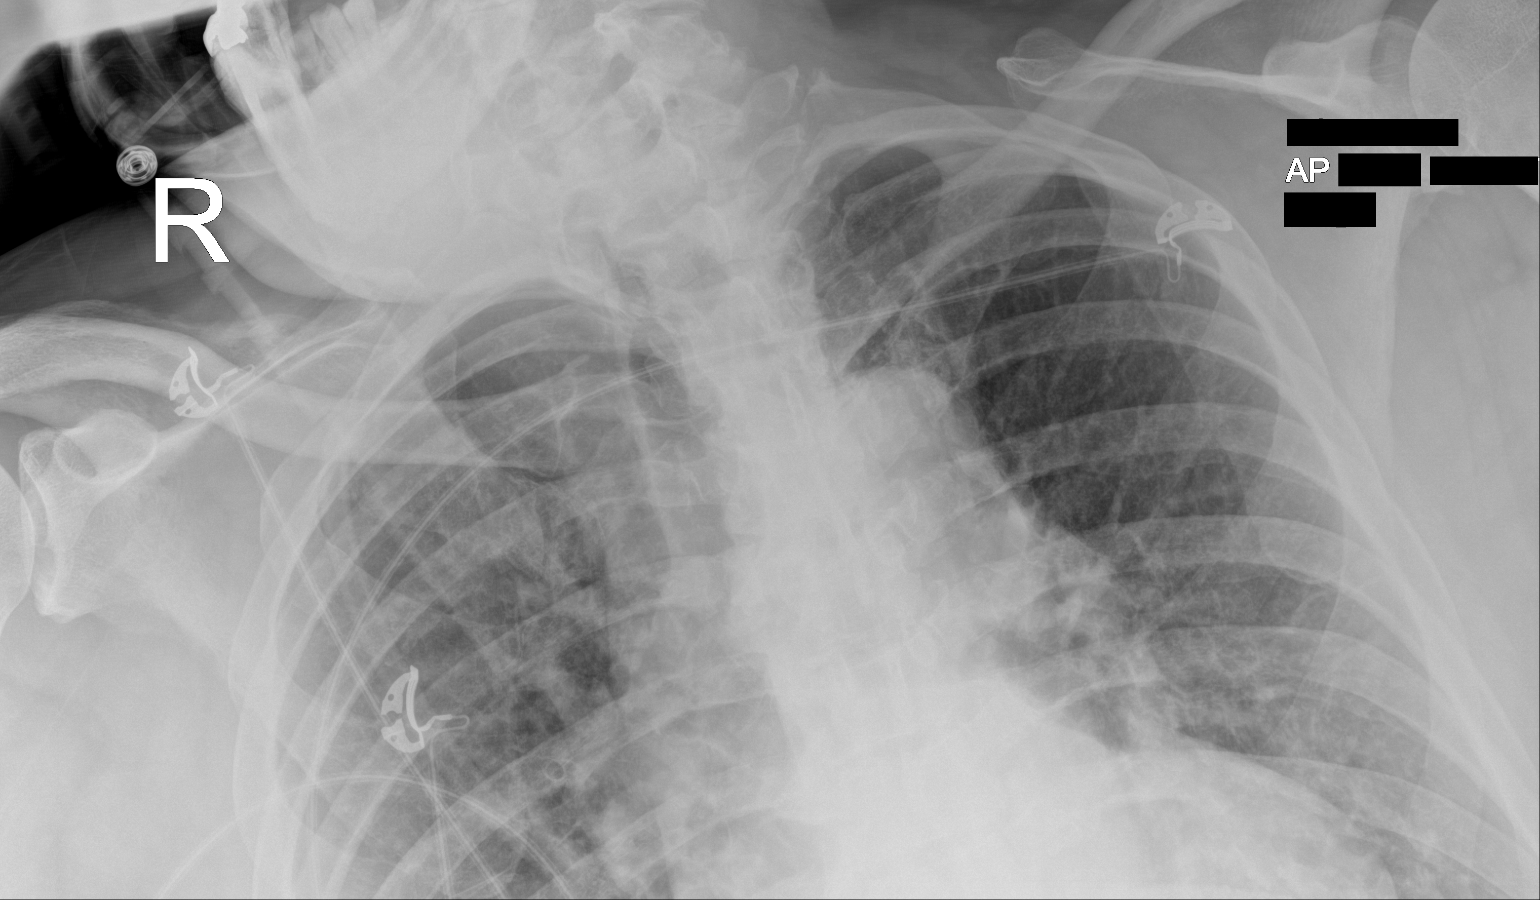
[im 2/2]
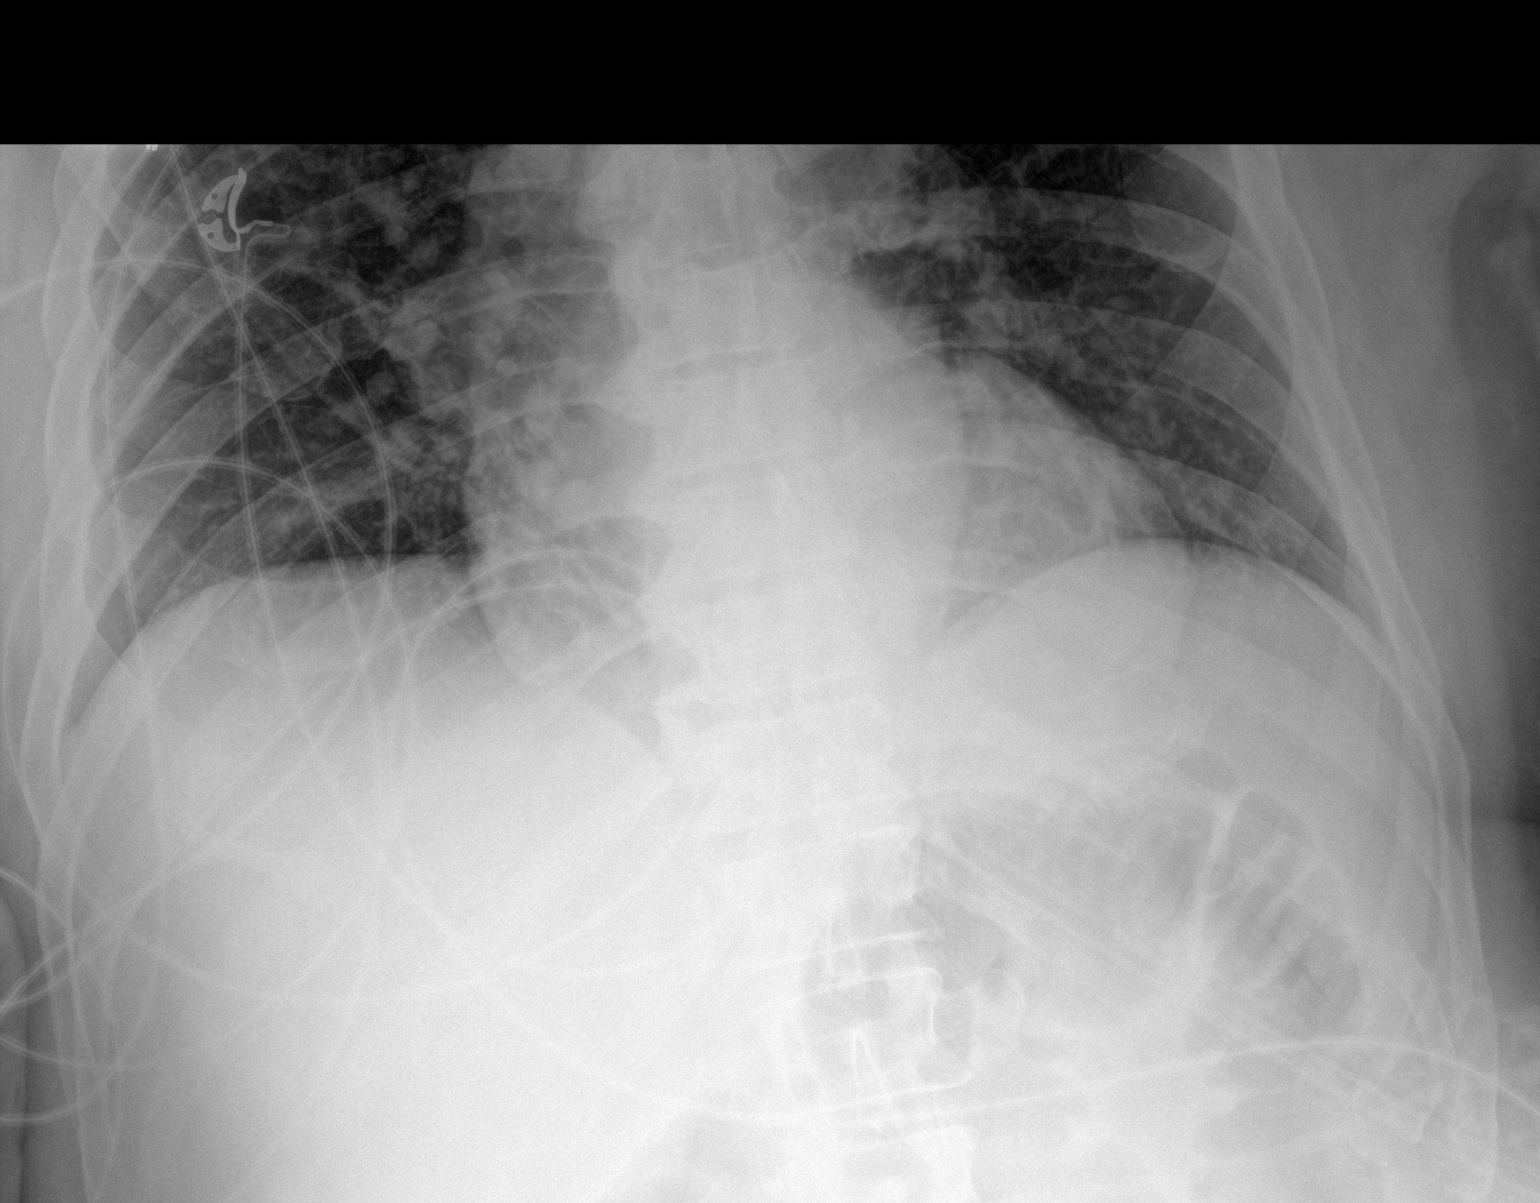

[2 of 2 positions shown; findings below may reference images not displayed]

FINDINGS: Cardiac shadow is stable. Lungs are well aerated bilaterally. No
focal infiltrate or sizable effusion is seen. No bony abnormality is
noted.
IMPRESSION: No acute abnormality noted.

## 2018-09-21 MED ORDER — HEPARIN SODIUM (PORCINE) 1000 UNIT/ML IJ SOLN
1000.0000 [IU] | INTRAMUSCULAR | Status: DC | PRN
  Administered 2018-09-21: 12:00:00 2800 [IU] via INTRAVENOUS
  Filled 2018-09-21 (×2): qty 6
  Filled 2018-09-21: qty 3

## 2018-09-21 MED ORDER — FENTANYL CITRATE (PF) 100 MCG/2ML IJ SOLN
INTRAMUSCULAR | Status: AC
  Filled 2018-09-21: qty 2

## 2018-09-21 MED ORDER — FENTANYL CITRATE (PF) 100 MCG/2ML IJ SOLN
12.5000 ug | Freq: Once | INTRAMUSCULAR | Status: AC
  Administered 2018-09-21: 12.5 ug via INTRAVENOUS

## 2018-09-21 MED ORDER — STERILE WATER FOR INJECTION IV SOLN
INTRAVENOUS | Status: DC
  Administered 2018-09-21 – 2018-09-23 (×4): via INTRAVENOUS
  Filled 2018-09-21 (×5): qty 850

## 2018-09-21 MED ORDER — BUDESONIDE 0.5 MG/2ML IN SUSP
0.5000 mg | Freq: Two times a day (BID) | RESPIRATORY_TRACT | Status: DC
  Administered 2018-09-21 – 2018-09-29 (×16): 0.5 mg via RESPIRATORY_TRACT
  Filled 2018-09-21 (×16): qty 2

## 2018-09-21 MED ORDER — IPRATROPIUM-ALBUTEROL 0.5-2.5 (3) MG/3ML IN SOLN
3.0000 mL | RESPIRATORY_TRACT | Status: DC
  Administered 2018-09-21 – 2018-09-23 (×11): 3 mL via RESPIRATORY_TRACT
  Filled 2018-09-21 (×11): qty 3

## 2018-09-21 MED ORDER — HEPARIN BOLUS VIA INFUSION
3000.0000 [IU] | Freq: Once | INTRAVENOUS | Status: AC
  Administered 2018-09-21: 3000 [IU] via INTRAVENOUS
  Filled 2018-09-21: qty 3000

## 2018-09-21 MED ORDER — FENTANYL CITRATE (PF) 100 MCG/2ML IJ SOLN
12.5000 ug | Freq: Four times a day (QID) | INTRAMUSCULAR | Status: DC
  Administered 2018-09-21 – 2018-09-22 (×5): 12.5 ug via INTRAVENOUS
  Filled 2018-09-21 (×5): qty 2

## 2018-09-21 MED ORDER — PIPERACILLIN-TAZOBACTAM 3.375 G IVPB
3.3750 g | Freq: Two times a day (BID) | INTRAVENOUS | Status: DC
  Administered 2018-09-21 – 2018-09-27 (×10): 3.375 g via INTRAVENOUS
  Filled 2018-09-21 (×11): qty 50

## 2018-09-21 MED ORDER — HEPARIN BOLUS VIA INFUSION
3000.0000 [IU] | Freq: Once | INTRAVENOUS | Status: AC
  Administered 2018-09-21: 23:00:00 3000 [IU] via INTRAVENOUS
  Filled 2018-09-21: qty 3000

## 2018-09-21 MED ORDER — FENTANYL CITRATE (PF) 100 MCG/2ML IJ SOLN
12.5000 ug | Freq: Three times a day (TID) | INTRAMUSCULAR | Status: DC | PRN
  Administered 2018-09-22 (×2): 12.5 ug via INTRAVENOUS
  Filled 2018-09-21 (×2): qty 2

## 2018-09-21 NOTE — Progress Notes (Signed)
PHARMACY NOTE:  ANTIMICROBIAL RENAL DOSAGE ADJUSTMENT  Current antimicrobial regimen includes a mismatch between antimicrobial dosage and estimated renal function.  As per policy approved by the Pharmacy & Therapeutics and Medical Executive Committees, the antimicrobial dosage will be adjusted accordingly.  Current antimicrobial dosage:  Zosyn 3.375 q8h  Indication: sepsis s/t UTI vs. Infected joint  Renal Function:  Estimated Creatinine Clearance: 15.8 mL/min (A) (by C-G formula based on SCr of 7.73 mg/dL (H)). []      On intermittent HD, scheduled: []      On CRRT    Antimicrobial dosage has been changed to:  Zosyn 3.375g IV q12h per CrCl < 20 ml/min  Additional comments: Will continue to monitor renal function and adjust abx doses as necessary.  Thank you for allowing pharmacy to be a part of this patient's care.  Tobie Lords, [facility] 09/21/2018 6:29 AM

## 2018-09-21 NOTE — Progress Notes (Signed)
PT Cancellation Note  Patient Details Name: Isaac Cross MRN: [id number] DOB: 06-01-1960   Cancelled Treatment:    Reason Eval/Treat Not Completed: Patient not medically ready.  Per discussion with pt's nurse, will hold PT today (d/t pt's medical status).  Plan to start dialysis today.  Will monitor pt's status and re-attempt PT re-evaluation tomorrow as medically appropriate.  Leitha Bleak, PT 09/21/18, 1:23 PM [phone]

## 2018-09-21 NOTE — Progress Notes (Signed)
Standard for heparin drip management Indication: DVT  Allergies  Allergen Reactions  . Lipitor [Atorvastatin] Hives and Itching  . Other   . Viagra  [Sildenafil Citrate]     vision loss    Patient Measurements: Height: 6\' 4"  (193 cm) Weight: (!) 320 lb (145.2 kg) IBW/kg (Calculated) : 86.8 Heparin Dosing Weight: 120 kg  Vital Signs: Temp: 98.7 F (37.1 C) (06/04 2000) Temp Source: Axillary (06/04 2000) BP: 133/75 (06/04 2000) Pulse Rate: 108 (06/04 2000)  Labs: Recent Labs    09/19/18 0439 09/19/18 1312 09/20/18 0500 09/20/18 0948  09/20/18 1811  09/21/18 0120 09/21/18 1013 09/21/18 1319 09/21/18 1356 09/21/18 1905 09/21/18 2216  HGB 9.0*  --  8.6*  --   --   --    < > 8.7*  --  8.2*  --  7.8*  --   HCT 28.4*  --  28.4*  --   --   --    < > 28.0*  --  26.5*  --  24.8*  --   PLT 269  --  354  --   --   --   --  276  --   --   --   --   --   APTT  --   --   --   --   --  35  --   --   --   --   --   --   --   HEPARINUNFRC  --   --   --   --    < > <0.10*  --  <0.10* <0.10*  --  0.12*  --  0.16*  CREATININE 3.42*  --  5.98*  --   --   --   --  7.73*  --   --   --   --   --   CKTOTAL  --  716*  --  992*  --   --   --   --   --   --   --   --   --    < > = values in this interval not displayed.    Estimated Creatinine Clearance: 15.8 mL/min (A) (by C-G formula based on SCr of 7.73 mg/dL (H)).   Medical History: Past Medical History:  Diagnosis Date  . Allergy   . Anxiety   . Arthritis    knees  . Asthma, mild intermittent, well-controlled   . Chronic obstructive asthma ([place])   . COPD (chronic obstructive pulmonary disease) ([place])   . Depression with anxiety   . Diabetes ([place])   . GERD (gastroesophageal reflux disease)   . HBP (high blood pressure)   . High cholesterol   . Renal cyst, right   . Sleep apnea    CPAP  . Swelling     Medications:  Scheduled:  . sodium chloride   Intravenous Once  . aspirin EC  81  mg Oral Daily  . budesonide (PULMICORT) nebulizer solution  0.5 mg Nebulization BID  . Chlorhexidine Gluconate Cloth  6 each Topical Q0600  . fentaNYL (SUBLIMAZE) injection  12.5 mcg Intravenous H7W  . folic acid  1 mg Oral Daily  . heparin  3,000 Units Intravenous Once  . hydrocortisone sod succinate (SOLU-CORTEF) inj  50 mg Intravenous Q6H  . insulin aspart  0-15 Units Subcutaneous TID WC  . insulin aspart  0-5 Units Subcutaneous QHS  . insulin glargine  12 Units Subcutaneous QHS  .  ipratropium-albuterol  3 mL Nebulization Q4H  . mouth rinse  15 mL Mouth Rinse BID  . multivitamin with minerals  1 tablet Oral Daily  . thiamine  100 mg Oral Daily  . tranexamic acid (CYKLOKAPRON) topical -INTRAOP  2,000 mg Topical Once   Infusions:  . anidulafungin Stopped (09/21/18 1501)  . dexmedetomidine (PRECEDEX) IV infusion Stopped (09/20/18 2153)  . doxycycline (VIBRAMYCIN) IV 100 mg (09/21/18 2212)  . heparin 2,300 Units/hr (09/21/18 1647)  . norepinephrine (LEVOPHED) Adult infusion Stopped (09/20/18 1953)  . piperacillin-tazobactam (ZOSYN)  IV 12.5 mL/hr at 09/21/18 1647  .  sodium bicarbonate (isotonic) infusion in sterile water 50 mL/hr at 09/21/18 1647    Assessment: Pharmacy consulted for heparin drip management for 58 yo male being treated with heparin for left popliteal vein DVT. Patient with bilateral knee replacement on 5/15. Patient admitted to [facility] on 5/26 with weakness and pain. Patient underwent I&D of left knee on 5/31 and right knee on 6/1. Patient prior to admission was on rivaroxaban 10mg . This was not continued in house and anti-Xa level was <0.1.   Goal of Therapy:  Heparin level 0.3-0.7 units/ml Monitor platelets by anticoagulation protocol: Yes   Plan:  06/04 @ 2200 HL 0.16 subtherapeutic, no interruptions. Will rebolus w/ heparin 3000 units IV x 1 and increase rate to 2450 units/hr and will recheck HL w/ am labs. Hgb trending down will continue to monitor.  Tobie Lords, PharmD, BCPS Clinical Pharmacist 09/21/2018

## 2018-09-21 NOTE — Progress Notes (Signed)
Subjective:  Patient very confused  Objective:   VITALS:   Vitals:   09/21/18 1630 09/21/18 1700 09/21/18 1730 09/21/18 1800  BP: 121/83 130/72 135/78 131/81  Pulse: (!) 115 (!) 114 (!) 114 (!) 112  Resp: (!) 24 (!) 26 (!) 31 (!) 25  Temp: 99.5 F (37.5 C)     TempSrc: Axillary     SpO2: 96% 96% 95% 96%  Weight:      Height:        PHYSICAL EXAM:  Incision: dressing C/D/I bilaterally  LABS  Results for orders placed or performed during the hospital encounter of 09/12/18 (from the past 24 hour(s))  Hemoglobin and hematocrit, blood     Status: Abnormal   Collection Time: 09/20/18  7:24 PM  Result Value Ref Range   Hemoglobin 8.4 (L) 13.0 - 17.0 g/dL   HCT 27.3 (L) 39.0 - 52.0 %  Glucose, capillary     Status: Abnormal   Collection Time: 09/20/18  9:23 PM  Result Value Ref Range   Glucose-Capillary 184 (H) 70 - 99 mg/dL  Basic metabolic panel     Status: Abnormal   Collection Time: 09/21/18  1:20 AM  Result Value Ref Range   Sodium 139 135 - 145 mmol/L   Potassium 4.3 3.5 - 5.1 mmol/L   Chloride 111 98 - 111 mmol/L   CO2 17 (L) 22 - 32 mmol/L   Glucose, Bld 192 (H) 70 - 99 mg/dL   BUN 37 (H) 6 - 20 mg/dL   Creatinine, Ser 7.73 (H) 0.61 - 1.24 mg/dL   Calcium 8.3 (L) 8.9 - 10.3 mg/dL   GFR calc non Af Amer 7 (L) >60 mL/min   GFR calc Af Amer 8 (L) >60 mL/min   Anion gap 11 5 - 15  Magnesium     Status: None   Collection Time: 09/21/18  1:20 AM  Result Value Ref Range   Magnesium 2.4 1.7 - 2.4 mg/dL  Procalcitonin     Status: None   Collection Time: 09/21/18  1:20 AM  Result Value Ref Range   Procalcitonin 4.78 ng/mL  CBC with Differential/Platelet     Status: Abnormal   Collection Time: 09/21/18  1:20 AM  Result Value Ref Range   WBC 11.1 (H) 4.0 - 10.5 K/uL   RBC 3.25 (L) 4.22 - 5.81 MIL/uL   Hemoglobin 8.7 (L) 13.0 - 17.0 g/dL   HCT 28.0 (L) 39.0 - 52.0 %   MCV 86.2 80.0 - 100.0 fL   MCH 26.8 26.0 - 34.0 pg   MCHC 31.1 30.0 - 36.0 g/dL   RDW 15.7  (H) 11.5 - 15.5 %   Platelets 276 150 - 400 K/uL   nRBC 0.0 0.0 - 0.2 %   Neutrophils Relative % 72 %   Neutro Abs 8.0 (H) 1.7 - 7.7 K/uL   Lymphocytes Relative 13 %   Lymphs Abs 1.4 0.7 - 4.0 K/uL   Monocytes Relative 11 %   Monocytes Absolute 1.2 (H) 0.1 - 1.0 K/uL   Eosinophils Relative 3 %   Eosinophils Absolute 0.3 0.0 - 0.5 K/uL   Basophils Relative 0 %   Basophils Absolute 0.0 0.0 - 0.1 K/uL   Immature Granulocytes 1 %   Abs Immature Granulocytes 0.12 (H) 0.00 - 0.07 K/uL  Heparin level (unfractionated)     Status: Abnormal   Collection Time: 09/21/18  1:20 AM  Result Value Ref Range   Heparin Unfractionated <0.10 (L) 0.30 - 0.70 IU/mL  Glucose, capillary     Status: Abnormal   Collection Time: 09/21/18  8:00 AM  Result Value Ref Range   Glucose-Capillary 198 (H) 70 - 99 mg/dL  Heparin level (unfractionated)     Status: Abnormal   Collection Time: 09/21/18 10:13 AM  Result Value Ref Range   Heparin Unfractionated <0.10 (L) 0.30 - 0.70 IU/mL  Glucose, capillary     Status: Abnormal   Collection Time: 09/21/18 11:21 AM  Result Value Ref Range   Glucose-Capillary 206 (H) 70 - 99 mg/dL  Hemoglobin and hematocrit, blood     Status: Abnormal   Collection Time: 09/21/18  1:19 PM  Result Value Ref Range   Hemoglobin 8.2 (L) 13.0 - 17.0 g/dL   HCT 26.5 (L) 39.0 - 52.0 %  Heparin level (unfractionated)     Status: Abnormal   Collection Time: 09/21/18  1:56 PM  Result Value Ref Range   Heparin Unfractionated 0.12 (L) 0.30 - 0.70 IU/mL  C difficile quick scan w PCR reflex     Status: None   Collection Time: 09/21/18  3:27 PM  Result Value Ref Range   C Diff antigen NEGATIVE NEGATIVE   C Diff toxin NEGATIVE NEGATIVE   C Diff interpretation No C. difficile detected.   Gastrointestinal Panel by PCR , Stool     Status: None   Collection Time: 09/21/18  3:27 PM  Result Value Ref Range   Campylobacter species NOT DETECTED NOT DETECTED   Plesimonas shigelloides NOT DETECTED NOT  DETECTED   Salmonella species NOT DETECTED NOT DETECTED   Yersinia enterocolitica NOT DETECTED NOT DETECTED   Vibrio species NOT DETECTED NOT DETECTED   Vibrio cholerae NOT DETECTED NOT DETECTED   Enteroaggregative E coli (EAEC) NOT DETECTED NOT DETECTED   Enteropathogenic E coli (EPEC) NOT DETECTED NOT DETECTED   Enterotoxigenic E coli (ETEC) NOT DETECTED NOT DETECTED   Shiga like toxin producing E coli (STEC) NOT DETECTED NOT DETECTED   Shigella/Enteroinvasive E coli (EIEC) NOT DETECTED NOT DETECTED   Cryptosporidium NOT DETECTED NOT DETECTED   Cyclospora cayetanensis NOT DETECTED NOT DETECTED   Entamoeba histolytica NOT DETECTED NOT DETECTED   Giardia lamblia NOT DETECTED NOT DETECTED   Adenovirus F40/41 NOT DETECTED NOT DETECTED   Astrovirus NOT DETECTED NOT DETECTED   Norovirus GI/GII NOT DETECTED NOT DETECTED   Rotavirus A NOT DETECTED NOT DETECTED   Sapovirus (I, II, IV, and V) NOT DETECTED NOT DETECTED  Glucose, capillary     Status: Abnormal   Collection Time: 09/21/18  4:01 PM  Result Value Ref Range   Glucose-Capillary 213 (H) 70 - 99 mg/dL    US Venous Img Lower Bilateral  Result Date: 09/20/2018 CLINICAL DATA:  Bilateral lower extremity edema. History of knee surgery. Evaluate for DVT. EXAM: BILATERAL LOWER EXTREMITY VENOUS DOPPLER ULTRASOUND TECHNIQUE: Gray-scale sonography with graded compression, as well as color Doppler and duplex ultrasound were performed to evaluate the lower extremity deep venous systems from the level of the common femoral vein and including the common femoral, femoral, profunda femoral, popliteal and calf veins including the posterior tibial, peroneal and gastrocnemius veins when visible. The superficial great saphenous vein was also interrogated. Spectral Doppler was utilized to evaluate flow at rest and with distal augmentation maneuvers in the common femoral, femoral and popliteal veins. COMPARISON:  None. FINDINGS: Examination is degraded due to  patient body habitus and poor sonographic window. RIGHT LOWER EXTREMITY Common Femoral Vein: No evidence of thrombus. Normal compressibility, respiratory phasicity and  response to augmentation. Saphenofemoral Junction: No evidence of thrombus. Normal compressibility and flow on color Doppler imaging. Profunda Femoral Vein: No evidence of thrombus. Normal compressibility and flow on color Doppler imaging. Femoral Vein: No evidence of thrombus. Normal compressibility, respiratory phasicity and response to augmentation. Popliteal Vein: No evidence of thrombus. Normal compressibility, respiratory phasicity and response to augmentation. Calf Veins: Appear patent where imaged. Superficial Great Saphenous Vein: No evidence of thrombus. Normal compressibility. Other Findings: Note is made of a right inguinal lymph node which is not enlarged by size criteria measuring 1 cm greatest short axis diameter and maintains a benign fatty hila. LEFT LOWER EXTREMITY Common Femoral Vein: No evidence of thrombus. Normal compressibility, respiratory phasicity and response to augmentation. Saphenofemoral Junction: No evidence of thrombus. Normal compressibility and flow on color Doppler imaging. Profunda Femoral Vein: No evidence of thrombus. Normal compressibility and flow on color Doppler imaging. Femoral Vein: No evidence of thrombus. Normal compressibility, respiratory phasicity and response to augmentation. Popliteal Vein: There is hypoechoic near occlusive thrombus within the distal aspect the left popliteal vein (images 25 through 28). Calf Veins: Appear patent where imaged. Superficial Great Saphenous Vein: No evidence of thrombus. Normal compressibility. Other Findings: Note is made of an approximately 5.2 x 3.2 x 1.5 cm mixed echogenic serpiginous fluid collection with left popliteal fossa compatible with a Baker cyst. IMPRESSION: 1. The examination is positive for near occlusive DVT involving the distal aspect of the left  popliteal vein. 2. No evidence of DVT within the right lower extremity. 3. Note made of an approximately 5.2 cm complex left-sided Baker's cyst. Electronically Signed   By: Sandi Mariscal M.D.   On: 09/20/2018 17:31   Dg Chest Port 1 View  Result Date: 09/21/2018 CLINICAL DATA:  Central line placement EXAM: PORTABLE CHEST 1 VIEW COMPARISON:  09/21/2018 FINDINGS: Interval placement of a left neck multi lumen vascular catheter, tip projecting in the vicinity of the brachiocephalic confluence. Otherwise unchanged low volume AP portable examination with cardiomegaly. No acute appearing airspace disease. IMPRESSION: Interval placement of a left neck multi lumen vascular catheter, tip projecting in the vicinity of the brachiocephalic confluence. Otherwise unchanged low volume AP portable examination with cardiomegaly. No acute appearing airspace disease. Electronically Signed   By: Eddie Candle M.D.   On: 09/21/2018 11:38   Dg Chest Port 1 View  Result Date: 09/21/2018 CLINICAL DATA:  Acute respiratory failure EXAM: PORTABLE CHEST 1 VIEW COMPARISON:  09/20/2018 FINDINGS: Cardiac shadow is stable. Lungs are well aerated bilaterally. No focal infiltrate or sizable effusion is seen. No bony abnormality is noted. IMPRESSION: No acute abnormality noted. Electronically Signed   By: Inez Catalina M.D.   On: 09/21/2018 07:46   Dg Chest Port 1 View  Result Date: 09/20/2018 CLINICAL DATA:  Fevers EXAM: PORTABLE CHEST 1 VIEW COMPARISON:  09/14/2018 FINDINGS: Cardiac shadow is stable. Previously seen right jugular central line is been removed. The lungs demonstrate mild bibasilar atelectasis right greater than left. No bony abnormality is seen. IMPRESSION: Mild bibasilar atelectasis. Electronically Signed   By: Inez Catalina M.D.   On: 09/20/2018 11:12   US Abdomen Limited Ruq  Result Date: 09/20/2018 CLINICAL DATA:  Liver cirrhosis.  Alcohol withdrawal. EXAM: ULTRASOUND ABDOMEN LIMITED RIGHT UPPER QUADRANT COMPARISON:  CT AP  04/30/2015 FINDINGS: Gallbladder: Gallbladder sludge noted. No gallstones, wall thickening or sonographic Murphy's sign. Common bile duct: Diameter: 5 mm Liver: The liver is diffusely echogenic compatible with hepatic steatosis. No focal liver lesion. Portal vein is patent  on color Doppler imaging with normal direction of blood flow towards the liver. IMPRESSION: 1. Echogenic liver compatible with hepatic steatosis. 2. Gallbladder sludge. Electronically Signed   By: Kerby Moors M.D.   On: 09/20/2018 12:15    Assessment/Plan: 3 Days Post-Op   Active Problems:   Sepsis ([place])   Acute renal failure ([place])   Septic shock ([place])   Will plan right knee dressing change tomorrow. Continue to monitor closely.   Lovell Sheehan , MD 09/21/2018, 6:54 PM

## 2018-09-21 NOTE — Progress Notes (Signed)
[facility]  ROUNDING NOTE   Subjective:    Intake/Output Summary (Last 24 hours) at 09/21/2018 0926 Last data filed at 09/21/2018 0900 Gross per 24 hour  Intake 4697.71 ml  Output 130 ml  Net 4567.71 ml   S Creatinine significantly higher at 3.42-> 6->7.7 UOP 100 cc Patient remains delirious this morning. Hypotensive  Off pressors Mental status may be a little better today as he is oriented to self  Objective:  Vital signs in last 24 hours:  Temp:  [98.5 F (36.9 C)-100.2 F (37.9 C)] 100.2 F (37.9 C) (06/04 0830) Pulse Rate:  [103-127] 112 (06/04 0830) Resp:  [11-39] 31 (06/04 0830) BP: (72-138)/(50-105) 118/87 (06/04 0830) SpO2:  [89 %-100 %] 100 % (06/04 0830)  Weight change:  Filed Weights   09/12/18 0935  Weight: (!) 145.2 kg    Intake/Output: I/O last 3 completed shifts: In: 5631.9 [I.V.:4558.1; Other:125; IV Piggyback:948.8] Out: 200 [Urine:200]   Intake/Output this shift:  Total I/O In: 396.7 [I.V.:360.6; IV Piggyback:36.1] Out: 30 [Urine:30]  Physical Exam: General: NAD, ill appearing  Head: Normocephalic, atraumatic. Dry oral mucosal membranes  Eyes: Anicteric,    Lungs:  Clear to auscultation  Heart: tachycardic  Abdomen:  Soft, nontender, obese, distended, decreased BS  Extremities:  ++ peripheral edema., b.l knee dressing  Neurologic: Alert;  Oriented to self  Skin: warm    Foley in place    Basic Metabolic Panel: Recent Labs  Lab 09/17/18 0543 09/18/18 0517 09/19/18 0439 09/20/18 0500 09/21/18 0120  NA 135 136 134* 137 139  K 3.4* 3.7 3.8 3.9 4.3  CL 98 102 101 107 111  CO2 22 21* 22 19* 17*  GLUCOSE 201* 186* 167* 174* 192*  BUN 19 20 22* 30* 37*  CREATININE 2.46* 2.60* 3.42* 5.98* 7.73*  CALCIUM 9.1 8.8* 8.2* 8.2* 8.3*  MG 1.5* 1.7 1.9 2.0 2.4  PHOS 4.5  --   --   --   --     Liver Function Tests: Recent Labs  Lab 09/15/18 0436 09/16/18 0459 09/19/18 0439 09/20/18 0505  AST 148* 143* 70* 64*  ALT 57* 59*  43 35  ALKPHOS 70 75 71 67  BILITOT 0.7 0.8 0.6 0.7  PROT 5.8* 6.2* 6.0* 6.1*  ALBUMIN 1.9* 2.1* 1.9* 1.8*   No results for input(s): LIPASE, AMYLASE in the last 168 hours. Recent Labs  Lab 09/20/18 0948  AMMONIA 12    CBC: Recent Labs  Lab 09/15/18 0436  09/16/18 0459 09/17/18 0543 09/19/18 0439 09/20/18 0500 09/20/18 1924 09/21/18 0120  WBC 5.5  --  6.8 7.9 11.2* 13.5*  --  11.1*  NEUTROABS 3.0  --  4.3  --  6.4  --   --  8.0*  HGB 7.5*   < > 9.6* 9.5* 9.0* 8.6* 8.4* 8.7*  HCT 23.2*   < > 29.1* 29.3* 28.4* 28.4* 27.3* 28.0*  MCV 82.0  --  81.3 81.8 85.3 85.8  --  86.2  PLT 158  --  193 227 269 354  --  276   < > = values in this interval not displayed.    Cardiac Enzymes: Recent Labs  Lab 09/19/18 1312 09/20/18 0948  CKTOTAL 716* 992*    BNP: Invalid input(s): POCBNP  CBG: Recent Labs  Lab 09/20/18 0733 09/20/18 1144 09/20/18 1555 09/20/18 2123 09/21/18 0800  GLUCAP 151* 149* 166* 184* 198*    Microbiology: Results for orders placed or performed during the hospital encounter of 09/12/18  Blood Culture (routine x 2)     Status: None   Collection Time: 09/12/18  9:56 AM  Result Value Ref Range Status   Specimen Description BLOOD LEFT HAND  Final   Special Requests   Final    BOTTLES DRAWN AEROBIC AND ANAEROBIC Blood Culture adequate volume   Culture   Final    NO GROWTH 5 DAYS Performed at [facility], [address], [place], [place] [address]    Report Status 09/17/2018 FINAL  Final  Urine culture     Status: None   Collection Time: 09/12/18 10:01 AM  Result Value Ref Range Status   Specimen Description   Final    URINE, RANDOM Performed at [facility], [address], [place], [place] [address]    Special Requests   Final    NONE Performed at [facility], [address], [place], [place] [address]    Culture   Final    NO GROWTH Performed at [facility], [place] [address], [place],   [address]    Report Status 09/13/2018 FINAL  Final  SARS Coronavirus 2 (CEPHEID- Performed in [place] hospital lab), Hosp Order     Status: None   Collection Time: 09/12/18 10:02 AM  Result Value Ref Range Status   SARS Coronavirus 2 NEGATIVE NEGATIVE Final    Comment: (NOTE) If result is NEGATIVE SARS-CoV-2 target nucleic acids are NOT DETECTED. The SARS-CoV-2 RNA is generally detectable in upper and lower  respiratory specimens during the acute phase of infection. The lowest  concentration of SARS-CoV-2 viral copies this assay can detect is 250  copies / mL. A negative result does not preclude SARS-CoV-2 infection  and should not be used as the sole basis for treatment or other  patient management decisions.  A negative result may occur with  improper specimen collection / handling, submission of specimen other  than nasopharyngeal swab, presence of viral mutation(s) within the  areas targeted by this assay, and inadequate number of viral copies  (<250 copies / mL). A negative result must be combined with clinical  observations, patient history, and epidemiological information. If result is POSITIVE SARS-CoV-2 target nucleic acids are DETECTED. The SARS-CoV-2 RNA is generally detectable in upper and lower  respiratory specimens dur ing the acute phase of infection.  Positive  results are indicative of active infection with SARS-CoV-2.  Clinical  correlation with patient history and other diagnostic information is  necessary to determine patient infection status.  Positive results do  not rule out bacterial infection or co-infection with other viruses. If result is PRESUMPTIVE POSTIVE SARS-CoV-2 nucleic acids MAY BE PRESENT.   A presumptive positive result was obtained on the submitted specimen  and confirmed on repeat testing.  While 2019 novel coronavirus  (SARS-CoV-2) nucleic acids may be present in the submitted sample  additional confirmatory testing may be necessary for  epidemiological  and / or clinical management purposes  to differentiate between  SARS-CoV-2 and other Sarbecovirus currently known to infect humans.  If clinically indicated additional testing with an alternate test  methodology [phone]) is advised. The SARS-CoV-2 RNA is generally  detectable in upper and lower respiratory sp ecimens during the acute  phase of infection. The expected result is Negative. Fact Sheet for Patients:  StrictlyIdeas.no Fact Sheet for Healthcare Providers: BankingDealers.co.za This test is not yet approved or cleared by the [place] FDA and has been authorized for detection and/or diagnosis of SARS-CoV-2 by FDA under  an Emergency Use Authorization (EUA).  This EUA will remain in effect (meaning this test can be used) for the duration of the COVID-19 declaration under Section 564(b)(1) of the Act, 21 U.S.C. section 360bbb-3(b)(1), unless the authorization is terminated or revoked sooner. Performed at [facility], [address] [place], [place] [address]   Blood Culture (routine x 2)     Status: None   Collection Time: 09/12/18 10:06 AM  Result Value Ref Range Status   Specimen Description BLOOD LEFT ANTECUBITAL  Final   Special Requests   Final    BOTTLES DRAWN AEROBIC AND ANAEROBIC Blood Culture adequate volume   Culture   Final    NO GROWTH 5 DAYS Performed at [facility], [address] [place], [place] [address]    Report Status 09/17/2018 FINAL  Final  MRSA PCR Screening     Status: None   Collection Time: 09/12/18  7:04 PM  Result Value Ref Range Status   MRSA by PCR NEGATIVE NEGATIVE Final    Comment:        The GeneXpert MRSA Assay (FDA approved for NASAL specimens only), is one component of a comprehensive MRSA colonization surveillance program. It is not intended to diagnose MRSA infection nor to guide or monitor treatment for MRSA infections. Performed at  [facility], [address] [place], [place] [address]   C difficile quick scan w PCR reflex     Status: None   Collection Time: 09/14/18  2:21 PM  Result Value Ref Range Status   C Diff antigen NEGATIVE NEGATIVE Final   C Diff toxin NEGATIVE NEGATIVE Final   C Diff interpretation No C. difficile detected.  Final    Comment: Performed at [facility], [address] [place], [place] [address]  Group A Strep by PCR     Status: None   Collection Time: 09/15/18  1:53 PM  Result Value Ref Range Status   Group A Strep by PCR NOT DETECTED NOT DETECTED Final    Comment: Performed at [facility], [address], [place], [place] [address]  Body fluid culture     Status: None   Collection Time: 09/15/18  5:13 PM  Result Value Ref Range Status   Specimen Description   Final    KNEE Performed at [facility], [address] [place], [place] [address]    Special Requests   Final    NONE Performed at [facility], [address], [place], [place] [address]    Gram Stain   Final    RARE RBCS FEW WBC SEEN NO ORGANISMS SEEN Performed at [facility], [address], [place], [place] [address]    Culture   Final    NO GROWTH 3 DAYS Performed at [facility], [place] [address], [place], [place] [address]    Report Status 09/19/2018 FINAL  Final  Body fluid culture     Status: None   Collection Time: 09/16/18  8:55 AM  Result Value Ref Range Status   Specimen Description KNEE RIGHT  Final   Special Requests NONE  Final   Gram Stain   Final    RARE WBC PRESENT, PREDOMINANTLY PMN NO ORGANISMS SEEN    Culture   Final    NO GROWTH 3 DAYS Performed at [facility], [place] [address], [place], [place] [address]    Report Status 09/20/2018 FINAL  Final  Aerobic/Anaerobic Culture (surgical/deep wound)  Status: None (Preliminary result)   Collection Time: 09/17/18 11:32 AM  Result Value Ref Range Status   Specimen  Description   Final    TISSUE LEFT KNEE 1 Performed at [facility], [address] [place], [place] [address]    Special Requests PATIENT ON FOLLOWING MAXIPIME VANCOMYCIN  Final   Gram Stain   Final    FEW WBC PRESENT,BOTH PMN AND MONONUCLEAR NO ORGANISMS SEEN    Culture   Final    NO GROWTH 3 DAYS NO ANAEROBES ISOLATED; CULTURE IN PROGRESS FOR 5 DAYS Performed at [facility], [place] [address], [place], [place] [address]    Report Status PENDING  Incomplete  Acid Fast Smear (AFB)     Status: None   Collection Time: 09/17/18 11:32 AM  Result Value Ref Range Status   AFB Specimen Processing Comment  Final    Comment: Tissue Grinding and Digestion/Decontamination   Acid Fast Smear Negative  Final    Comment: (NOTE) Performed At: [facility] [place], [place] [id number] Rush Farmer MD FB:[phone]    Source (AFB) TISSUE  Final    Comment: LEFT KNEE Performed at [facility], [place] [address] [place], [place] [address]   Aerobic/Anaerobic Culture (surgical/deep wound)     Status: None (Preliminary result)   Collection Time: 09/17/18 11:43 AM  Result Value Ref Range Status   Specimen Description   Final    TISSUE LEFT KNEE 2 Performed at [facility], [address] [place], [place] [address]    Special Requests PATIENT ON FOLLOWING MAXIPIME VANCOMYCIN  Final   Gram Stain   Final    FEW WBC PRESENT, PREDOMINANTLY PMN NO ORGANISMS SEEN    Culture   Final    NO GROWTH 3 DAYS NO ANAEROBES ISOLATED; CULTURE IN PROGRESS FOR 5 DAYS Performed at [facility], [place] [address] [place], [place] [address]    Report Status PENDING  Incomplete  Aerobic/Anaerobic Culture (surgical/deep wound)     Status: None (Preliminary result)   Collection Time: 09/17/18 11:44 AM  Result Value Ref Range Status   Specimen Description   Final    TISSUE LEFT KNEE 3 Performed at [facility], [address] [place],  [place] [address]    Special Requests PATIENT ON FOLLOWING MAXIPIME VANCOMYCIN  Final   Gram Stain   Final    MODERATE WBC PRESENT, PREDOMINANTLY PMN NO ORGANISMS SEEN    Culture   Final    NO GROWTH 3 DAYS NO ANAEROBES ISOLATED; CULTURE IN PROGRESS FOR 5 DAYS Performed at [facility], [place] [address], [place], [place] [address]    Report Status PENDING  Incomplete  Aerobic/Anaerobic Culture (surgical/deep wound)     Status: None (Preliminary result)   Collection Time: 09/18/18  9:43 PM  Result Value Ref Range Status   Specimen Description   Final    SYNOVIAL Performed at [facility], [address] [place], [place] [address]    Special Requests   Final    NONE Performed at [facility], [address] [place], [place] [address]    Gram Stain   Final    RARE WBC PRESENT, PREDOMINANTLY MONONUCLEAR NO ORGANISMS SEEN    Culture   Final    NO GROWTH 1 DAY Performed at [facility], [place] [address], [place], [place] [address]    Report Status PENDING  Incomplete  Aerobic/Anaerobic Culture (surgical/deep  wound)     Status: None (Preliminary result)   Collection Time: 09/18/18  9:44 PM  Result Value Ref Range Status   Specimen Description   Final    SYNOVIAL Performed at [facility], [address] [place], [place] [address]    Special Requests   Final    RIGHT KNEE Performed at [facility], [place], [place] [address]    Gram Stain   Final    FEW WBC PRESENT,BOTH PMN AND MONONUCLEAR NO ORGANISMS SEEN    Culture   Final    NO GROWTH 1 DAY Performed at [facility], [place] [address] [place], [place] [address]    Report Status PENDING  Incomplete  Aerobic/Anaerobic Culture (surgical/deep wound)     Status: None (Preliminary result)   Collection Time: 09/18/18  9:45 PM  Result Value Ref Range Status   Specimen Description   Final    SYNOVIAL Performed at [facility], [address],  [place], [place] [address]    Special Requests   Final    RIGHT KNEE Performed at [facility], [address] [place], [place] [address]    Gram Stain   Final    FEW WBC PRESENT, PREDOMINANTLY MONONUCLEAR NO ORGANISMS SEEN    Culture   Final    NO GROWTH 1 DAY Performed at [facility], [place] [address], [place], [place] [address]    Report Status PENDING  Incomplete  Culture, blood (Routine X 2) w Reflex to ID Panel     Status: None (Preliminary result)   Collection Time: 09/19/18  1:11 PM  Result Value Ref Range Status   Specimen Description BLOOD LEFT ANTECUBITAL  Final   Special Requests   Final    BOTTLES DRAWN AEROBIC AND ANAEROBIC Blood Culture results may not be optimal due to an excessive volume of blood received in culture bottles   Culture   Final    NO GROWTH 2 DAYS Performed at [facility], [address] [place], [place] [address]    Report Status PENDING  Incomplete  Culture, blood (Routine X 2) w Reflex to ID Panel     Status: None (Preliminary result)   Collection Time: 09/19/18  1:20 PM  Result Value Ref Range Status   Specimen Description BLOOD BLOOD RIGHT HAND  Final   Special Requests   Final    BOTTLES DRAWN AEROBIC AND ANAEROBIC Blood Culture results may not be optimal due to an excessive volume of blood received in culture bottles   Culture   Final    NO GROWTH 2 DAYS Performed at [facility], [address] [place], [place] [address]    Report Status PENDING  Incomplete    Coagulation Studies: No results for input(s): LABPROT, INR in the last 72 hours.  Urinalysis: Recent Labs    09/19/18 1234 09/20/18 1559  COLORURINE AMBER* AMBER*  LABSPEC 1.018 1.016  PHURINE 5.0 5.0  GLUCOSEU NEGATIVE NEGATIVE  HGBUR LARGE* LARGE*  BILIRUBINUR NEGATIVE NEGATIVE  KETONESUR 5* NEGATIVE  PROTEINUR 100* 100*  NITRITE NEGATIVE NEGATIVE  LEUKOCYTESUR NEGATIVE TRACE*      Imaging: US Venous Img Lower  Bilateral  Result Date: 09/20/2018 CLINICAL DATA:  Bilateral lower extremity edema. History of knee surgery. Evaluate for DVT. EXAM: BILATERAL LOWER EXTREMITY VENOUS DOPPLER ULTRASOUND TECHNIQUE: Gray-scale sonography with graded compression, as well as color Doppler and duplex ultrasound were performed to evaluate the lower extremity deep venous  systems from the level of the common femoral vein and including the common femoral, femoral, profunda femoral, popliteal and calf veins including the posterior tibial, peroneal and gastrocnemius veins when visible. The superficial great saphenous vein was also interrogated. Spectral Doppler was utilized to evaluate flow at rest and with distal augmentation maneuvers in the common femoral, femoral and popliteal veins. COMPARISON:  None. FINDINGS: Examination is degraded due to patient body habitus and poor sonographic window. RIGHT LOWER EXTREMITY Common Femoral Vein: No evidence of thrombus. Normal compressibility, respiratory phasicity and response to augmentation. Saphenofemoral Junction: No evidence of thrombus. Normal compressibility and flow on color Doppler imaging. Profunda Femoral Vein: No evidence of thrombus. Normal compressibility and flow on color Doppler imaging. Femoral Vein: No evidence of thrombus. Normal compressibility, respiratory phasicity and response to augmentation. Popliteal Vein: No evidence of thrombus. Normal compressibility, respiratory phasicity and response to augmentation. Calf Veins: Appear patent where imaged. Superficial Great Saphenous Vein: No evidence of thrombus. Normal compressibility. Other Findings: Note is made of a right inguinal lymph node which is not enlarged by size criteria measuring 1 cm greatest short axis diameter and maintains a benign fatty hila. LEFT LOWER EXTREMITY Common Femoral Vein: No evidence of thrombus. Normal compressibility, respiratory phasicity and response to augmentation. Saphenofemoral Junction: No  evidence of thrombus. Normal compressibility and flow on color Doppler imaging. Profunda Femoral Vein: No evidence of thrombus. Normal compressibility and flow on color Doppler imaging. Femoral Vein: No evidence of thrombus. Normal compressibility, respiratory phasicity and response to augmentation. Popliteal Vein: There is hypoechoic near occlusive thrombus within the distal aspect the left popliteal vein (images 25 through 28). Calf Veins: Appear patent where imaged. Superficial Great Saphenous Vein: No evidence of thrombus. Normal compressibility. Other Findings: Note is made of an approximately 5.2 x 3.2 x 1.5 cm mixed echogenic serpiginous fluid collection with left popliteal fossa compatible with a Baker cyst. IMPRESSION: 1. The examination is positive for near occlusive DVT involving the distal aspect of the left popliteal vein. 2. No evidence of DVT within the right lower extremity. 3. Note made of an approximately 5.2 cm complex left-sided Baker's cyst. Electronically Signed   By: Sandi Mariscal M.D.   On: 09/20/2018 17:31   Dg Chest Port 1 View  Result Date: 09/21/2018 CLINICAL DATA:  Acute respiratory failure EXAM: PORTABLE CHEST 1 VIEW COMPARISON:  09/20/2018 FINDINGS: Cardiac shadow is stable. Lungs are well aerated bilaterally. No focal infiltrate or sizable effusion is seen. No bony abnormality is noted. IMPRESSION: No acute abnormality noted. Electronically Signed   By: Inez Catalina M.D.   On: 09/21/2018 07:46   Dg Chest Port 1 View  Result Date: 09/20/2018 CLINICAL DATA:  Fevers EXAM: PORTABLE CHEST 1 VIEW COMPARISON:  09/14/2018 FINDINGS: Cardiac shadow is stable. Previously seen right jugular central line is been removed. The lungs demonstrate mild bibasilar atelectasis right greater than left. No bony abnormality is seen. IMPRESSION: Mild bibasilar atelectasis. Electronically Signed   By: Inez Catalina M.D.   On: 09/20/2018 11:12   US Abdomen Limited Ruq  Result Date: 09/20/2018 CLINICAL  DATA:  Liver cirrhosis.  Alcohol withdrawal. EXAM: ULTRASOUND ABDOMEN LIMITED RIGHT UPPER QUADRANT COMPARISON:  CT AP 04/30/2015 FINDINGS: Gallbladder: Gallbladder sludge noted. No gallstones, wall thickening or sonographic Murphy's sign. Common bile duct: Diameter: 5 mm Liver: The liver is diffusely echogenic compatible with hepatic steatosis. No focal liver lesion. Portal vein is patent on color Doppler imaging with normal direction of blood flow towards the liver. IMPRESSION: 1.  Echogenic liver compatible with hepatic steatosis. 2. Gallbladder sludge. Electronically Signed   By: Kerby Moors M.D.   On: 09/20/2018 12:15     Medications:   . sodium chloride 50 mL/hr at 09/21/18 0918  . anidulafungin    . dexmedetomidine (PRECEDEX) IV infusion Stopped (09/20/18 2153)  . doxycycline (VIBRAMYCIN) IV Stopped (09/21/18 0053)  . heparin 2,100 Units/hr (09/21/18 0900)  . norepinephrine (LEVOPHED) Adult infusion Stopped (09/20/18 1953)  . piperacillin-tazobactam (ZOSYN)  IV    .  sodium bicarbonate (isotonic) infusion in sterile water 50 mL/hr at 09/21/18 0900   . sodium chloride   Intravenous Once  . aspirin EC  81 mg Oral Daily  . Chlorhexidine Gluconate Cloth  6 each Topical Q0600  . docusate sodium  100 mg Oral BID  . fluticasone furoate-vilanterol  1 puff Inhalation Daily  . folic acid  1 mg Oral Daily  . hydrocortisone sod succinate (SOLU-CORTEF) inj  50 mg Intravenous Q6H  . insulin aspart  0-15 Units Subcutaneous TID WC  . insulin aspart  0-5 Units Subcutaneous QHS  . insulin glargine  12 Units Subcutaneous QHS  . loratadine  10 mg Oral Daily  . mouth rinse  15 mL Mouth Rinse BID  . multivitamin with minerals  1 tablet Oral Daily  . thiamine  100 mg Oral Daily  . tranexamic acid (CYKLOKAPRON) topical -INTRAOP  2,000 mg Topical Once   acetaminophen **OR** acetaminophen, albuterol, docusate sodium, HYDROcodone-acetaminophen, iohexol, LORazepam, menthol-cetylpyridinium **OR** phenol,  menthol-cetylpyridinium **OR** phenol, metoCLOPramide **OR** metoCLOPramide (REGLAN) injection, ondansetron **OR** ondansetron (ZOFRAN) IV  Assessment/ Plan:  [name] is a 58 y.o. black male with hyeprtension, COPD, diabetes mellitus type II who underwent bilateral knee replacement surgeries last week. who was admitted to [facility] on 09/12/2018 for sepsis  1. Acute renal failure with metabolic acidosis:  baseline creatinine of 1.07 with normal GFR in 06/12/18.  Likely severe ATN from hypotension and sepsis -  Hold nephrotoxic meds - patient remains delirious, oliguric. Creatinine critically elevated, acidotic -  Discussed with patient's wife and medical team that patient may benefit from dialysis for metabolic optimization. She has consented to proceed - ICU team with place dialysis catheter - orders prepared  2. 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 Hyponatremia and peripheral edema: secondary to renal failure and hypervolemic volume status.  - monitor closely     LOS: 9 Delrose Rohwer 6/4/20209:26 AM

## 2018-09-21 NOTE — Progress Notes (Signed)
[place] for heparin drip management Indication: DVT  Allergies  Allergen Reactions  . Lipitor [Atorvastatin] Hives and Itching  . Other   . Viagra  [Sildenafil Citrate]     vision loss    Patient Measurements: Height: 6\' 4"  (193 cm) Weight: (!) 320 lb (145.2 kg) IBW/kg (Calculated) : 86.8 Heparin Dosing Weight: 120 kg  Vital Signs: Temp: 100.3 F (37.9 C) (06/04 1200) Temp Source: Axillary (06/04 1200) BP: 143/79 (06/04 1230) Pulse Rate: 113 (06/04 1200)  Labs: Recent Labs    09/19/18 0439 09/19/18 1312 09/20/18 0500 09/20/18 0948  09/20/18 1811 09/20/18 1924 09/21/18 0120 09/21/18 1013 09/21/18 1319 09/21/18 1356  HGB 9.0*  --  8.6*  --   --   --  8.4* 8.7*  --  8.2*  --   HCT 28.4*  --  28.4*  --   --   --  27.3* 28.0*  --  26.5*  --   PLT 269  --  354  --   --   --   --  276  --   --   --   APTT  --   --   --   --   --  35  --   --   --   --   --   HEPARINUNFRC  --   --   --   --    < > <0.10*  --  <0.10* <0.10*  --  0.12*  CREATININE 3.42*  --  5.98*  --   --   --   --  7.73*  --   --   --   CKTOTAL  --  716*  --  992*  --   --   --   --   --   --   --    < > = values in this interval not displayed.    Estimated Creatinine Clearance: 15.8 mL/min (A) (by C-G formula based on SCr of 7.73 mg/dL (H)).   Medical History: Past Medical History:  Diagnosis Date  . Allergy   . Anxiety   . Arthritis    knees  . Asthma, mild intermittent, well-controlled   . Chronic obstructive asthma ([place])   . COPD (chronic obstructive pulmonary disease) ([place])   . Depression with anxiety   . Diabetes ([place])   . GERD (gastroesophageal reflux disease)   . HBP (high blood pressure)   . High cholesterol   . Renal cyst, right   . Sleep apnea    CPAP  . Swelling     Medications:  Scheduled:  . sodium chloride   Intravenous Once  . aspirin EC  81 mg Oral Daily  . budesonide (PULMICORT) nebulizer solution  0.5 mg Nebulization BID  .  Chlorhexidine Gluconate Cloth  6 each Topical Q0600  . fentaNYL (SUBLIMAZE) injection  12.5 mcg Intravenous F6E  . folic acid  1 mg Oral Daily  . heparin  3,000 Units Intravenous Once  . hydrocortisone sod succinate (SOLU-CORTEF) inj  50 mg Intravenous Q6H  . insulin aspart  0-15 Units Subcutaneous TID WC  . insulin aspart  0-5 Units Subcutaneous QHS  . insulin glargine  12 Units Subcutaneous QHS  . ipratropium-albuterol  3 mL Nebulization Q4H  . mouth rinse  15 mL Mouth Rinse BID  . multivitamin with minerals  1 tablet Oral Daily  . thiamine  100 mg Oral Daily  . tranexamic acid (CYKLOKAPRON) topical -INTRAOP  2,000  mg Topical Once   Infusions:  . sodium chloride 50 mL/hr at 09/21/18 0918  . anidulafungin    . dexmedetomidine (PRECEDEX) IV infusion Stopped (09/20/18 2153)  . doxycycline (VIBRAMYCIN) IV 100 mg (09/21/18 1200)  . heparin 2,100 Units/hr (09/21/18 0900)  . norepinephrine (LEVOPHED) Adult infusion Stopped (09/20/18 1953)  . piperacillin-tazobactam (ZOSYN)  IV    .  sodium bicarbonate (isotonic) infusion in sterile water 50 mL/hr at 09/21/18 0900    Assessment: Pharmacy consulted for heparin drip management for 58 yo male being treated with heparin for left popliteal vein DVT. Patient with bilateral knee replacement on 5/15. Patient admitted to [facility] on 5/26 with weakness and pain. Patient underwent I&D of left knee on 5/31 and right knee on 6/1. Patient prior to admission was on rivaroxaban 10mg . This was not continued in house and anti-Xa level was <0.1.   Goal of Therapy:  Heparin level 0.3-0.7 units/ml Monitor platelets by anticoagulation protocol: Yes   Plan:  Heparin level remains subtherapeutic. Due to computer interruptions throughout the day, ordered 1000 level did not result until 1415. Confirmed level was subtherapeutic with lab work resulting at 1428. Confirmed with RN no interruptions in therapy. Will order heparin bolus of 3000 units and increase rate to 2300  units/hr. Will obtain follow-up anti-Xa level at 2230. Patient is to have serial hematocrit and hemoglobins as ordered by ICU team.   Pharmacy will continue to monitor and adjust per consult.   Currie Paris, RPh 09/21/2018,2:33 PM

## 2018-09-21 NOTE — Progress Notes (Signed)
[place] for heparin drip management Indication: DVT  Allergies  Allergen Reactions  . Lipitor [Atorvastatin] Hives and Itching  . Other   . Viagra  [Sildenafil Citrate]     vision loss    Patient Measurements: Height: 6\' 4"  (193 cm) Weight: (!) 320 lb (145.2 kg) IBW/kg (Calculated) : 86.8 Heparin Dosing Weight: 120 kg  Vital Signs: Temp: 98.5 F (36.9 C) (06/04 0200) Temp Source: Axillary (06/04 0200) BP: 101/84 (06/04 0300) Pulse Rate: 111 (06/04 0300)  Labs: Recent Labs    09/19/18 0439 09/19/18 1312 09/20/18 0500 09/20/18 0948 09/20/18 1811 09/20/18 1924 09/21/18 0120  HGB 9.0*  --  8.6*  --   --  8.4* 8.7*  HCT 28.4*  --  28.4*  --   --  27.3* 28.0*  PLT 269  --  354  --   --   --  276  APTT  --   --   --   --  35  --   --   HEPARINUNFRC  --   --   --   --  <0.10*  --  <0.10*  CREATININE 3.42*  --  5.98*  --   --   --  7.73*  CKTOTAL  --  716*  --  992*  --   --   --     Estimated Creatinine Clearance: 15.8 mL/min (A) (by C-G formula based on SCr of 7.73 mg/dL (H)).   Medical History: Past Medical History:  Diagnosis Date  . Allergy   . Anxiety   . Arthritis    knees  . Asthma, mild intermittent, well-controlled   . Chronic obstructive asthma ([place])   . COPD (chronic obstructive pulmonary disease) ([place])   . Depression with anxiety   . Diabetes ([place])   . GERD (gastroesophageal reflux disease)   . HBP (high blood pressure)   . High cholesterol   . Renal cyst, right   . Sleep apnea    CPAP  . Swelling     Medications:  Scheduled:  . sodium chloride   Intravenous Once  . aspirin EC  81 mg Oral Daily  . Chlorhexidine Gluconate Cloth  6 each Topical Q0600  . docusate sodium  100 mg Oral BID  . fluticasone furoate-vilanterol  1 puff Inhalation Daily  . folic acid  1 mg Oral Daily  . heparin  3,000 Units Intravenous Once  . hydrocortisone sod succinate (SOLU-CORTEF) inj  50 mg Intravenous Q6H  . insulin  aspart  0-15 Units Subcutaneous TID WC  . insulin aspart  0-5 Units Subcutaneous QHS  . insulin glargine  12 Units Subcutaneous QHS  . loratadine  10 mg Oral Daily  . mouth rinse  15 mL Mouth Rinse BID  . multivitamin with minerals  1 tablet Oral Daily  . thiamine  100 mg Oral Daily  . tranexamic acid (CYKLOKAPRON) topical -INTRAOP  2,000 mg Topical Once   Infusions:  . sodium chloride 50 mL/hr at 09/21/18 0335  . anidulafungin    . dexmedetomidine (PRECEDEX) IV infusion Stopped (09/20/18 2153)  . doxycycline (VIBRAMYCIN) IV Stopped (09/21/18 0053)  . heparin 1,900 Units/hr (09/21/18 0337)  . norepinephrine (LEVOPHED) Adult infusion Stopped (09/20/18 1953)  . piperacillin-tazobactam (ZOSYN)  IV Stopped (09/20/18 2152)  .  sodium bicarbonate (isotonic) infusion in sterile water      Assessment: Pharmacy consulted for heparin drip management for 58 yo male being treated with heparin for left popliteal vein DVT. Patient  with bilateral knee replacement on 5/15. Patient admitted to [facility] on 5/26 with weakness and pain. Patient underwent I&D of left knee on 5/31 and right knee on 6/1. Patient prior to admission was on rivaroxaban 10mg . This was not continued in house and anti-Xa level was <0.1.   Goal of Therapy:  Heparin level 0.3-0.7 units/ml Monitor platelets by anticoagulation protocol: Yes   Plan:  06/04 @ 0130 HL < 0.10 x 2 subtherapeutic. Will bolus heparin 3000 units IV x 1 and will increase rate 2100 units/hr and will recheck HL @ 1000, H/h trending normally will continue to monitor.  Tobie Lords, PharmD, BCPS Clinical Pharmacist 09/21/2018,4:01 AM

## 2018-09-21 NOTE — Progress Notes (Signed)
[place] [facility] [place] NAME: Isaac Cross    MR#:  [id number]  DATE OF BIRTH:  [birth year]  SUBJECTIVE:  CHIEF COMPLAINT:   Chief Complaint  Patient presents with   Dizziness   -  Appears critically ill, some myoclonic jerks, very confused -T-max is improved in the last 24 hours.  Has a left leg clot -Renal function worsening, decreased urine output  REVIEW OF SYSTEMS:  Review of Systems  Unable to perform ROS: Critical illness      DRUG ALLERGIES:   Allergies  Allergen Reactions   Lipitor [Atorvastatin] Hives and Itching   Other    Viagra  [Sildenafil Citrate]     vision loss    VITALS:  Blood pressure 124/86, pulse (!) 113, temperature 100.2 F (37.9 C), temperature source Axillary, resp. rate (!) 27, height 6\' 4"  (1.93 m), weight (!) 145.2 kg, SpO2 99 %.  PHYSICAL EXAMINATION:  Physical Exam   GENERAL:  59 y.o.-year-old patient lying in the bed with no acute distress.  EYES: Pupils equal, round, reactive to light and accommodation. No scleral icterus. Extraocular muscles intact.  HEENT: Head atraumatic, normocephalic. Oropharynx and nasopharynx clear.  NECK:  Supple, no jugular venous distention. No thyroid enlargement, no tenderness.  LUNGS: Normal breath sounds bilaterally, no wheezing, rales,rhonchi or crepitation. No use of accessory muscles of respiration.  Decreased bibasilar breath sounds CARDIOVASCULAR: S1, S2 normal. No murmurs, rubs, or gallops.  ABDOMEN: Soft, nontender, nondistended. Bowel sounds present. No organomegaly or mass.  EXTREMITIES: Swelling of both legs, knees noted with recent surgery, both knees in ice packing.  No  cyanosis, or clubbing.  myoclonic jerks noted Both hands are swollen as well. NEUROLOGIC: Cranial nerves II through XII are intact.  Extremely confused.  Not following commands.  Myoclonic jerks noted.  Withdrawing to pain. PSYCHIATRIC: The patient is alert but disoriented.   Intermittent confusion noted SKIN: No obvious rash, lesion, or ulcer.    LABORATORY PANEL:   CBC Recent Labs  Lab 09/21/18 0120  WBC 11.1*  HGB 8.7*  HCT 28.0*  PLT 276   ------------------------------------------------------------------------------------------------------------------  Chemistries  Recent Labs  Lab 09/20/18 0505 09/21/18 0120  NA  --  139  K  --  4.3  CL  --  111  CO2  --  17*  GLUCOSE  --  192*  BUN  --  37*  CREATININE  --  7.73*  CALCIUM  --  8.3*  MG  --  2.4  AST 64*  --   ALT 35  --   ALKPHOS 67  --   BILITOT 0.7  --    ------------------------------------------------------------------------------------------------------------------  Cardiac Enzymes No results for input(s): TROPONINI in the last 168 hours. ------------------------------------------------------------------------------------------------------------------  RADIOLOGY:  US Venous Img Lower Bilateral  Result Date: 09/20/2018 CLINICAL DATA:  Bilateral lower extremity edema. History of knee surgery. Evaluate for DVT. EXAM: BILATERAL LOWER EXTREMITY VENOUS DOPPLER ULTRASOUND TECHNIQUE: Gray-scale sonography with graded compression, as well as color Doppler and duplex ultrasound were performed to evaluate the lower extremity deep venous systems from the level of the common femoral vein and including the common femoral, femoral, profunda femoral, popliteal and calf veins including the posterior tibial, peroneal and gastrocnemius veins when visible. The superficial great saphenous vein was also interrogated. Spectral Doppler was utilized to evaluate flow at rest and with distal augmentation maneuvers in the common femoral, femoral and popliteal veins. COMPARISON:  None. FINDINGS: Examination is degraded due to patient  body habitus and poor sonographic window. RIGHT LOWER EXTREMITY Common Femoral Vein: No evidence of thrombus. Normal compressibility, respiratory phasicity and response to  augmentation. Saphenofemoral Junction: No evidence of thrombus. Normal compressibility and flow on color Doppler imaging. Profunda Femoral Vein: No evidence of thrombus. Normal compressibility and flow on color Doppler imaging. Femoral Vein: No evidence of thrombus. Normal compressibility, respiratory phasicity and response to augmentation. Popliteal Vein: No evidence of thrombus. Normal compressibility, respiratory phasicity and response to augmentation. Calf Veins: Appear patent where imaged. Superficial Great Saphenous Vein: No evidence of thrombus. Normal compressibility. Other Findings: Note is made of a right inguinal lymph node which is not enlarged by size criteria measuring 1 cm greatest short axis diameter and maintains a benign fatty hila. LEFT LOWER EXTREMITY Common Femoral Vein: No evidence of thrombus. Normal compressibility, respiratory phasicity and response to augmentation. Saphenofemoral Junction: No evidence of thrombus. Normal compressibility and flow on color Doppler imaging. Profunda Femoral Vein: No evidence of thrombus. Normal compressibility and flow on color Doppler imaging. Femoral Vein: No evidence of thrombus. Normal compressibility, respiratory phasicity and response to augmentation. Popliteal Vein: There is hypoechoic near occlusive thrombus within the distal aspect the left popliteal vein (images 25 through 28). Calf Veins: Appear patent where imaged. Superficial Great Saphenous Vein: No evidence of thrombus. Normal compressibility. Other Findings: Note is made of an approximately 5.2 x 3.2 x 1.5 cm mixed echogenic serpiginous fluid collection with left popliteal fossa compatible with a Baker cyst. IMPRESSION: 1. The examination is positive for near occlusive DVT involving the distal aspect of the left popliteal vein. 2. No evidence of DVT within the right lower extremity. 3. Note made of an approximately 5.2 cm complex left-sided Baker's cyst. Electronically Signed   By: Sandi Mariscal  M.D.   On: 09/20/2018 17:31   Dg Chest Port 1 View  Result Date: 09/21/2018 CLINICAL DATA:  Central line placement EXAM: PORTABLE CHEST 1 VIEW COMPARISON:  09/21/2018 FINDINGS: Interval placement of a left neck multi lumen vascular catheter, tip projecting in the vicinity of the brachiocephalic confluence. Otherwise unchanged low volume AP portable examination with cardiomegaly. No acute appearing airspace disease. IMPRESSION: Interval placement of a left neck multi lumen vascular catheter, tip projecting in the vicinity of the brachiocephalic confluence. Otherwise unchanged low volume AP portable examination with cardiomegaly. No acute appearing airspace disease. Electronically Signed   By: Eddie Candle M.D.   On: 09/21/2018 11:38   Dg Chest Port 1 View  Result Date: 09/21/2018 CLINICAL DATA:  Acute respiratory failure EXAM: PORTABLE CHEST 1 VIEW COMPARISON:  09/20/2018 FINDINGS: Cardiac shadow is stable. Lungs are well aerated bilaterally. No focal infiltrate or sizable effusion is seen. No bony abnormality is noted. IMPRESSION: No acute abnormality noted. Electronically Signed   By: Inez Catalina M.D.   On: 09/21/2018 07:46   Dg Chest Port 1 View  Result Date: 09/20/2018 CLINICAL DATA:  Fevers EXAM: PORTABLE CHEST 1 VIEW COMPARISON:  09/14/2018 FINDINGS: Cardiac shadow is stable. Previously seen right jugular central line is been removed. The lungs demonstrate mild bibasilar atelectasis right greater than left. No bony abnormality is seen. IMPRESSION: Mild bibasilar atelectasis. Electronically Signed   By: Inez Catalina M.D.   On: 09/20/2018 11:12   US Abdomen Limited Ruq  Result Date: 09/20/2018 CLINICAL DATA:  Liver cirrhosis.  Alcohol withdrawal. EXAM: ULTRASOUND ABDOMEN LIMITED RIGHT UPPER QUADRANT COMPARISON:  CT AP 04/30/2015 FINDINGS: Gallbladder: Gallbladder sludge noted. No gallstones, wall thickening or sonographic Murphy's sign. Common bile duct:  Diameter: 5 mm Liver: The liver is diffusely  echogenic compatible with hepatic steatosis. No focal liver lesion. Portal vein is patent on color Doppler imaging with normal direction of blood flow towards the liver. IMPRESSION: 1. Echogenic liver compatible with hepatic steatosis. 2. Gallbladder sludge. Electronically Signed   By: Kerby Moors M.D.   On: 09/20/2018 12:15    EKG:   Orders placed or performed during the hospital encounter of 09/12/18   EKG 12-Lead   EKG 12-Lead    ASSESSMENT AND PLAN:    EdwardGradyis a58 y.o.malewith a known history of arthritis, COPD not on home oxygen, hypertension, sleep apnea, non-insulin-dependent diabetes mellitus presents to hospital secondary to dizziness, weakness and a fall  1.Sepsis-with ongoing fevers.  Still spiking as high as 103 F.  Decreased T-max within the last 24 hours. -Underlying new blood clot found in left popliteal vein. -s/p Recent bilateral knee replacement surgeries as outpatient and now bilateral knee washouts done.  Cultures are negative so far.  Fungal cultures are pending -Appreciate ID consult.  Patient on zosyn, doxycycline and anidulafungin to cover fungal orgms - Will need a CT of his chest abdomen and pelvis when stable.  Blood cultures are negative.- urine cultures negative -Off pressors today.  Received IV albumin as well.  Remains on stress dose steroids  2. Acute renal failure-likely ATN from sepsis -Much worsening creatinine and decreased urine output - appreciate nephrology input. -Patient getting more encephalopathic.  Creatinine greater than 7, even though potassium is within normal limits, metabolic acidosis noted with low bicarb. -Hemodialysis will be started. -Hold lisinopril and metformin and other nephrotoxins.     3.  Acute encephalopathy-secondary to metabolic causes, sepsis and uremia.  Monitor closely.  No focal deficits  4. Left popliteal vein DVT-started on heparin drip  5. Diabetes mellitus- sliding scale insulin and Lantus  has been added while inpatient -Hold metformin given worsening renal failure.  6. DVT prophylaxis- SCD  Physical therapy consult needed Patient is critically ill Nephrology has updated wife today   All the records are reviewed and case discussed with Care Management/Social Workerr. Management plans discussed with the patient, family and they are in agreement.  CODE STATUS: Full code  TOTAL TIME TAKING CARE OF THIS PATIENT: 41 minutes.   POSSIBLE D/C IN 3-4 DAYS, DEPENDING ON CLINICAL CONDITION.   Gladstone Lighter M.D on 09/21/2018 at 12:53 PM  Between 7am to 6pm - Pager - [phone]  After 6pm go to [email or web address] - password [email or web address] [facility]  Office  [phone]  CC: Primary care physician; Steele Sizer, MD

## 2018-09-21 NOTE — Progress Notes (Signed)
CRITICAL CARE NOTE  CC  follow up sepsis  SUBJECTIVE Patient remains critically ill Prognosis is guarded On CIWA Sepsis and fevers [place] + DVT On pressors Started heparin infusion   BP 125/73   Pulse (!) 114   Temp 98.5 F (36.9 C) (Axillary)   Resp (!) 25   Ht 6\' 4"  (1.93 m)   Wt (!) 145.2 kg   SpO2 99%   BMI 38.95 kg/m    I/O last 3 completed shifts: In: 5631.9 [I.V.:4558.1; Other:125; IV Piggyback:948.8] Out: 200 [Urine:200] No intake/output data recorded.  SpO2: 99 % O2 Flow Rate (L/min): 6 L/min   SIGNIFICANT EVENTS 6/3 [place] + DVT  REVIEW OF SYSTEMS  PATIENT IS UNABLE TO PROVIDE COMPLETE REVIEW OF SYSTEMS DUE TO SEVERE CRITICAL ILLNESS   PHYSICAL EXAMINATION:  GENERAL:critically ill appearing,  HEAD: Normocephalic, atraumatic.  EYES: Pupils equal, round, reactive to light.  No scleral icterus.  MOUTH: Moist mucosal membrane. NECK: Supple. No thyromegaly. No nodules. No JVD.  PULMONARY: +rhonchi,  CARDIOVASCULAR: S1 and S2. Regular rate and rhythm. No murmurs, rubs, or gallops.  GASTROINTESTINAL: Soft, nontender, -distended. No masses. Positive bowel sounds. No hepatosplenomegaly.  MUSCULOSKELETAL: No swelling, clubbing, or edema.  NEUROLOGIC: obtunded SKIN:intact,warm,dry  MEDICATIONS: I have reviewed all medications and confirmed regimen as documented   CULTURE RESULTS   Recent Results (from the past 240 hour(s))  Blood Culture (routine x 2)     Status: None   Collection Time: 09/12/18  9:56 AM  Result Value Ref Range Status   Specimen Description BLOOD LEFT HAND  Final   Special Requests   Final    BOTTLES DRAWN AEROBIC AND ANAEROBIC Blood Culture adequate volume   Culture   Final    NO GROWTH 5 DAYS Performed at [facility], [address], [place], [place] [address]    Report Status 09/17/2018 FINAL  Final  Urine culture     Status: None   Collection Time: 09/12/18 10:01 AM  Result Value Ref Range Status   Specimen  Description   Final    URINE, RANDOM Performed at [facility], [address] [place], [place] [address]    Special Requests   Final    NONE Performed at [facility], [address] [place], [place] [address]    Culture   Final    NO GROWTH Performed at [facility], [place] [address] [place], [place] [address]    Report Status 09/13/2018 FINAL  Final  SARS Coronavirus 2 (CEPHEID- Performed in [place] hospital lab), Hosp Order     Status: None   Collection Time: 09/12/18 10:02 AM  Result Value Ref Range Status   SARS Coronavirus 2 NEGATIVE NEGATIVE Final    Comment: (NOTE) If result is NEGATIVE SARS-CoV-2 target nucleic acids are NOT DETECTED. The SARS-CoV-2 RNA is generally detectable in upper and lower  respiratory specimens during the acute phase of infection. The lowest  concentration of SARS-CoV-2 viral copies this assay can detect is 250  copies / mL. A negative result does not preclude SARS-CoV-2 infection  and should not be used as the sole basis for treatment or other  patient management decisions.  A negative result may occur with  improper specimen collection / handling, submission of specimen other  than nasopharyngeal swab, presence of viral mutation(s) within the  areas targeted by this assay, and inadequate number of viral copies  (<250 copies / mL). A negative result must be combined with clinical  observations, patient history, and epidemiological information. If result is POSITIVE SARS-CoV-2 target nucleic acids are DETECTED. The SARS-CoV-2 RNA is generally detectable in upper and lower  respiratory specimens dur ing the acute phase of infection.  Positive  results are indicative of active infection with SARS-CoV-2.  Clinical  correlation with patient history and other diagnostic information is  necessary to determine patient infection status.  Positive results do  not rule out bacterial infection or co-infection with other  viruses. If result is PRESUMPTIVE POSTIVE SARS-CoV-2 nucleic acids MAY BE PRESENT.   A presumptive positive result was obtained on the submitted specimen  and confirmed on repeat testing.  While 2019 novel coronavirus  (SARS-CoV-2) nucleic acids may be present in the submitted sample  additional confirmatory testing may be necessary for epidemiological  and / or clinical management purposes  to differentiate between  SARS-CoV-2 and other Sarbecovirus currently known to infect humans.  If clinically indicated additional testing with an alternate test  methodology [phone]) is advised. The SARS-CoV-2 RNA is generally  detectable in upper and lower respiratory sp ecimens during the acute  phase of infection. The expected result is Negative. Fact Sheet for Patients:  StrictlyIdeas.no Fact Sheet for Healthcare Providers: BankingDealers.co.za This test is not yet approved or cleared by the [place] FDA and has been authorized for detection and/or diagnosis of SARS-CoV-2 by FDA under an Emergency Use Authorization (EUA).  This EUA will remain in effect (meaning this test can be used) for the duration of the COVID-19 declaration under Section 564(b)(1) of the Act, 21 U.S.C. section 360bbb-3(b)(1), unless the authorization is terminated or revoked sooner. Performed at [facility], [address] [place], [place] [address]   Blood Culture (routine x 2)     Status: None   Collection Time: 09/12/18 10:06 AM  Result Value Ref Range Status   Specimen Description BLOOD LEFT ANTECUBITAL  Final   Special Requests   Final    BOTTLES DRAWN AEROBIC AND ANAEROBIC Blood Culture adequate volume   Culture   Final    NO GROWTH 5 DAYS Performed at [facility], [address] [place], [place] [address]    Report Status 09/17/2018 FINAL  Final  MRSA PCR Screening     Status: None   Collection Time: 09/12/18  7:04 PM  Result Value  Ref Range Status   MRSA by PCR NEGATIVE NEGATIVE Final    Comment:        The GeneXpert MRSA Assay (FDA approved for NASAL specimens only), is one component of a comprehensive MRSA colonization surveillance program. It is not intended to diagnose MRSA infection nor to guide or monitor treatment for MRSA infections. Performed at [facility], [address] [place], [place] [address]   C difficile quick scan w PCR reflex     Status: None   Collection Time: 09/14/18  2:21 PM  Result Value Ref Range Status   C Diff antigen NEGATIVE NEGATIVE Final   C Diff toxin NEGATIVE NEGATIVE Final   C Diff interpretation No C. difficile detected.  Final    Comment: Performed at [facility], [address] [place], [place] [address]  Group A Strep by PCR     Status: None   Collection Time: 09/15/18  1:53 PM  Result Value Ref Range Status   Group A Strep by PCR NOT DETECTED NOT DETECTED Final    Comment: Performed at [facility], [address], [place], [place] [address]  Body  fluid culture     Status: None   Collection Time: 09/15/18  5:13 PM  Result Value Ref Range Status   Specimen Description   Final    KNEE Performed at [facility], [address], [place], [place] [address]    Special Requests   Final    NONE Performed at [facility], [address] [place], [place] [address]    Gram Stain   Final    RARE RBCS FEW WBC SEEN NO ORGANISMS SEEN Performed at [facility], [address] [place], [place] [address]    Culture   Final    NO GROWTH 3 DAYS Performed at [facility], [place] [address] [place], [place] [address]    Report Status 09/19/2018 FINAL  Final  Body fluid culture     Status: None   Collection Time: 09/16/18  8:55 AM  Result Value Ref Range Status   Specimen Description KNEE RIGHT  Final   Special Requests NONE  Final   Gram Stain   Final    RARE WBC PRESENT, PREDOMINANTLY PMN NO  ORGANISMS SEEN    Culture   Final    NO GROWTH 3 DAYS Performed at [facility], [place] [address], [place], [place] [address]    Report Status 09/20/2018 FINAL  Final  Aerobic/Anaerobic Culture (surgical/deep wound)     Status: None (Preliminary result)   Collection Time: 09/17/18 11:32 AM  Result Value Ref Range Status   Specimen Description   Final    TISSUE LEFT KNEE 1 Performed at [facility], [address] [place], [place] [address]    Special Requests PATIENT ON FOLLOWING MAXIPIME VANCOMYCIN  Final   Gram Stain   Final    FEW WBC PRESENT,BOTH PMN AND MONONUCLEAR NO ORGANISMS SEEN    Culture   Final    NO GROWTH 3 DAYS NO ANAEROBES ISOLATED; CULTURE IN PROGRESS FOR 5 DAYS Performed at [facility], [place] [address], [place], [place] [address]    Report Status PENDING  Incomplete  Acid Fast Smear (AFB)     Status: None   Collection Time: 09/17/18 11:32 AM  Result Value Ref Range Status   AFB Specimen Processing Comment  Final    Comment: Tissue Grinding and Digestion/Decontamination   Acid Fast Smear Negative  Final    Comment: (NOTE) Performed At: [facility] [place], [place] [id number] Rush Farmer MD [id number]    Source (AFB) TISSUE  Final    Comment: LEFT KNEE Performed at [facility], [place] [address] [place], [place] [address]   Aerobic/Anaerobic Culture (surgical/deep wound)     Status: None (Preliminary result)   Collection Time: 09/17/18 11:43 AM  Result Value Ref Range Status   Specimen Description   Final    TISSUE LEFT KNEE 2 Performed at [facility], [address] [place], [place] [address]    Special Requests PATIENT ON FOLLOWING MAXIPIME VANCOMYCIN  Final   Gram Stain   Final    FEW WBC PRESENT, PREDOMINANTLY PMN NO ORGANISMS SEEN    Culture   Final    NO GROWTH 3 DAYS NO ANAEROBES ISOLATED; CULTURE IN PROGRESS FOR 5 DAYS Performed at [facility], [place] [address]  [place], [place] [address]    Report Status PENDING  Incomplete  Aerobic/Anaerobic Culture (surgical/deep wound)     Status: None (Preliminary result)   Collection Time: 09/17/18 11:44 AM  Result Value Ref Range Status   Specimen Description   Final    TISSUE LEFT KNEE 3 Performed at [facility], [address] [place], [place] [address]    Special Requests PATIENT ON FOLLOWING MAXIPIME VANCOMYCIN  Final   Gram Stain   Final    MODERATE WBC PRESENT, PREDOMINANTLY PMN NO ORGANISMS SEEN    Culture   Final    NO GROWTH 3 DAYS NO ANAEROBES ISOLATED; CULTURE IN PROGRESS FOR 5 DAYS Performed at [facility], [place] [address], [place], [place] [address]    Report Status PENDING  Incomplete  Aerobic/Anaerobic Culture (surgical/deep wound)     Status: None (Preliminary result)   Collection Time: 09/18/18  9:43 PM  Result Value Ref Range Status   Specimen Description   Final    SYNOVIAL Performed at [facility], [address] [place], [place] [address]    Special Requests   Final    NONE Performed at [facility], [address] [place], [place] [address]    Gram Stain   Final    RARE WBC PRESENT, PREDOMINANTLY MONONUCLEAR NO ORGANISMS SEEN    Culture   Final    NO GROWTH 1 DAY Performed at [facility], [place] [address], [place], [place] [address]    Report Status PENDING  Incomplete  Aerobic/Anaerobic Culture (surgical/deep wound)     Status: None (Preliminary result)   Collection Time: 09/18/18  9:44 PM  Result Value Ref Range Status   Specimen Description   Final    SYNOVIAL Performed at [facility], [address] [place], [place] [address]    Special Requests   Final    RIGHT KNEE Performed at [facility], [place], [place] [address]    Gram Stain   Final    FEW WBC PRESENT,BOTH PMN AND MONONUCLEAR NO ORGANISMS SEEN    Culture   Final    NO GROWTH 1 DAY Performed at [facility], [place] [address] [place], [place] [address]    Report Status PENDING  Incomplete  Aerobic/Anaerobic Culture (surgical/deep wound)     Status: None (Preliminary result)   Collection Time: 09/18/18  9:45 PM  Result Value Ref Range Status   Specimen Description   Final    SYNOVIAL Performed at [facility], [address] [place], [place] [address]    Special Requests   Final    RIGHT KNEE Performed at [facility], [address] [place], [place] [address]    Gram Stain   Final    FEW WBC PRESENT, PREDOMINANTLY MONONUCLEAR NO ORGANISMS SEEN    Culture   Final    NO GROWTH 1 DAY Performed at [facility], [place] [address] [place], [place] [address]    Report Status PENDING  Incomplete  Culture, blood (Routine X 2) w Reflex to ID Panel     Status: None (Preliminary result)   Collection Time: 09/19/18  1:11 PM  Result Value Ref Range Status   Specimen Description BLOOD LEFT ANTECUBITAL  Final   Special Requests   Final    BOTTLES DRAWN AEROBIC AND ANAEROBIC Blood Culture results may not be optimal due to an excessive volume of blood received in culture bottles   Culture   Final    NO GROWTH 2 DAYS Performed at [facility], [address] [place], [place] [address]    Report Status PENDING  Incomplete  Culture, blood (Routine X 2) w Reflex to ID Panel     Status: None (Preliminary result)   Collection Time: 09/19/18  1:20 PM  Result Value Ref Range Status   Specimen Description BLOOD BLOOD RIGHT HAND  Final   Special Requests   Final    BOTTLES DRAWN AEROBIC AND ANAEROBIC Blood Culture results may not be optimal due to an excessive volume of blood received in culture bottles   Culture   Final    NO GROWTH 2 DAYS Performed at [facility], [address], [place], [place] [address]    Report Status PENDING  Incomplete          IMAGING    US Venous Img Lower Bilateral  Result Date: 09/20/2018 CLINICAL DATA:  Bilateral lower  extremity edema. History of knee surgery. Evaluate for DVT. EXAM: BILATERAL LOWER EXTREMITY VENOUS DOPPLER ULTRASOUND TECHNIQUE: Gray-scale sonography with graded compression, as well as color Doppler and duplex ultrasound were performed to evaluate the lower extremity deep venous systems from the level of the common femoral vein and including the common femoral, femoral, profunda femoral, popliteal and calf veins including the posterior tibial, peroneal and gastrocnemius veins when visible. The superficial great saphenous vein was also interrogated. Spectral Doppler was utilized to evaluate flow at rest and with distal augmentation maneuvers in the common femoral, femoral and popliteal veins. COMPARISON:  None. FINDINGS: Examination is degraded due to patient body habitus and poor sonographic window. RIGHT LOWER EXTREMITY Common Femoral Vein: No evidence of thrombus. Normal compressibility, respiratory phasicity and response to augmentation. Saphenofemoral Junction: No evidence of thrombus. Normal compressibility and flow on color Doppler imaging. Profunda Femoral Vein: No evidence of thrombus. Normal compressibility and flow on color Doppler imaging. Femoral Vein: No evidence of thrombus. Normal compressibility, respiratory phasicity and response to augmentation. Popliteal Vein: No evidence of thrombus. Normal compressibility, respiratory phasicity and response to augmentation. Calf Veins: Appear patent where imaged. Superficial Great Saphenous Vein: No evidence of thrombus. Normal compressibility. Other Findings: Note is made of a right inguinal lymph node which is not enlarged by size criteria measuring 1 cm greatest short axis diameter and maintains a benign fatty hila. LEFT LOWER EXTREMITY Common Femoral Vein: No evidence of thrombus. Normal compressibility, respiratory phasicity and response to augmentation. Saphenofemoral Junction: No evidence of thrombus. Normal compressibility and flow on color Doppler  imaging. Profunda Femoral Vein: No evidence of thrombus. Normal compressibility and flow on color Doppler imaging. Femoral Vein: No evidence of thrombus. Normal compressibility, respiratory phasicity and response to augmentation. Popliteal Vein: There is hypoechoic near occlusive thrombus within the distal aspect the left popliteal vein (images 25 through 28). Calf Veins: Appear patent where imaged. Superficial Great Saphenous Vein: No evidence of thrombus. Normal compressibility. Other Findings: Note is made of an approximately 5.2 x 3.2 x 1.5 cm mixed echogenic serpiginous fluid collection with left popliteal fossa compatible with a Baker cyst. IMPRESSION: 1. The examination is positive for near occlusive DVT involving the distal aspect of the left popliteal vein. 2. No evidence of DVT within the right lower extremity. 3. Note made of an approximately 5.2 cm complex left-sided Baker's cyst. Electronically Signed   By: Sandi Mariscal M.D.   On: 09/20/2018 17:31   Dg Chest Port 1 View  Result Date: 09/20/2018 CLINICAL DATA:  Fevers EXAM: PORTABLE CHEST 1 VIEW COMPARISON:  09/14/2018 FINDINGS: Cardiac shadow is stable. Previously seen right jugular central line is been removed. The lungs demonstrate mild bibasilar atelectasis  right greater than left. No bony abnormality is seen. IMPRESSION: Mild bibasilar atelectasis. Electronically Signed   By: Inez Catalina M.D.   On: 09/20/2018 11:12   US Abdomen Limited Ruq  Result Date: 09/20/2018 CLINICAL DATA:  Liver cirrhosis.  Alcohol withdrawal. EXAM: ULTRASOUND ABDOMEN LIMITED RIGHT UPPER QUADRANT COMPARISON:  CT AP 04/30/2015 FINDINGS: Gallbladder: Gallbladder sludge noted. No gallstones, wall thickening or sonographic Murphy's sign. Common bile duct: Diameter: 5 mm Liver: The liver is diffusely echogenic compatible with hepatic steatosis. No focal liver lesion. Portal vein is patent on color Doppler imaging with normal direction of blood flow towards the liver.  IMPRESSION: 1. Echogenic liver compatible with hepatic steatosis. 2. Gallbladder sludge. Electronically Signed   By: Kerby Moors M.D.   On: 09/20/2018 12:15   CBC    Component Value Date/Time   WBC 11.1 (H) 09/21/2018 0120   RBC 3.25 (L) 09/21/2018 0120   HGB 8.7 (L) 09/21/2018 0120   HCT 28.0 (L) 09/21/2018 0120   PLT 276 09/21/2018 0120   MCV 86.2 09/21/2018 0120   MCH 26.8 09/21/2018 0120   MCHC 31.1 09/21/2018 0120   RDW 15.7 (H) 09/21/2018 0120   LYMPHSABS 1.4 09/21/2018 0120   MONOABS 1.2 (H) 09/21/2018 0120   EOSABS 0.3 09/21/2018 0120   BASOSABS 0.0 09/21/2018 0120   BMP Latest Ref Rng & Units 09/21/2018 09/20/2018 09/19/2018  Glucose 70 - 99 mg/dL 192(H) 174(H) 167(H)  BUN 6 - 20 mg/dL 37(H) 30(H) 22(H)  Creatinine 0.61 - 1.24 mg/dL 7.73(H) 5.98(H) 3.42(H)  BUN/Creat Ratio 6 - 22 (calc) - - -  Sodium 135 - 145 mmol/L 139 137 134(L)  Potassium 3.5 - 5.1 mmol/L 4.3 3.9 3.8  Chloride 98 - 111 mmol/L 111 107 101  CO2 22 - 32 mmol/L 17(L) 19(L) 22  Calcium 8.9 - 10.3 mg/dL 8.3(L) 8.2(L) 8.2(L)         ASSESSMENT AND PLAN SYNOPSIS   INFECTIOUS DISEASE -continue antibiotics as prescribed -follow up cultures -follow up ID consultation Cultures NEG  Septic shock-Left knee infection -use vasopressors to keep MAP>65 -follow ABG and LA -follow up cultures -emperic ABX -consider stress dose steroids -aggressive IV fluid Resuscitation    SEVERE ALCOHOL WITHDRAWAL -Therapy with Thiamine and MVI -CIWA Protocol -Precedex as needed -High risk for intubation  -high risk for aspiration     ACUTE KIDNEY INJURY/Renal Failure -follow chem 7 -follow UO -continue Foley Catheter-assess need -Avoid nephrotoxic agents -Recheck creatinine  -will likely need HD/CRRT    CARDIAC ICU monitoring    GI GI PROPHYLAXIS as indicated  NUTRITIONAL STATUS DIET-->NPO Constipation protocol as indicated  ENDO - will use ICU hypoglycemic\Hyperglycemia protocol if  indicated   ELECTROLYTES -follow labs as needed -replace as needed -pharmacy consultation and following   DVT/GI PRX ordered TRANSFUSIONS AS NEEDED MONITOR FSBS ASSESS the need for LABS as needed   Critical Care Time devoted to patient care services described in this note is 34 minutes.   Overall, patient is critically ill, prognosis is guarded.  Patient with Multiorgan failure and at high risk for cardiac arrest and death.    Corrin Parker, M.D.  [facility]  Medical Director [place] Director [facility] Cardio-Pulmonary Department

## 2018-09-21 NOTE — Procedures (Signed)
Central Venous Dailysis Catheter Placement: TRIPLE LUMEN  Indication: Hemo Dialysis/CRRT   Consent:emergent   Hand washing performed prior to starting the procedure.   Procedure: An active timeout was performed and correct patient, name, & ID confirmed. Patient was positioned correctly for central venous access. Patient was prepped using strict sterile technique including chlorohexadine preps, sterile drape, sterile gown and sterile gloves.  The area was prepped, draped and anesthetized in the usual sterile manner. Patient comfort was obtained.    A Double lumen catheter was placed in LEFT IJ  Vein There was good blood return, catheter caps were placed on lumens, catheter flushed easily, the line was secured and a sterile dressing and BIO-PATCH applied.   Ultrasound was used to visualize vasculature and guidance of needle.   Number of Attempts: 1 Complications:none  Estimated Blood Loss: none Operator: Jaxson Anglin.   Isaac Cross Isaac Cross, M.D.  [facility]  Medical Director ICU-[facility] [place] Medical Director [facility] Cardio-Pulmonary Department     

## 2018-09-22 LAB — CBC WITH DIFFERENTIAL/PLATELET
Abs Immature Granulocytes: 0.11 10*3/uL — ABNORMAL HIGH (ref 0.00–0.07)
Basophils Absolute: 0 10*3/uL (ref 0.0–0.1)
Basophils Relative: 0 %
Eosinophils Absolute: 0 10*3/uL (ref 0.0–0.5)
Eosinophils Relative: 0 %
HCT: 24.7 % — ABNORMAL LOW (ref 39.0–52.0)
Hemoglobin: 7.8 g/dL — ABNORMAL LOW (ref 13.0–17.0)
Immature Granulocytes: 1 %
Lymphocytes Relative: 9 %
Lymphs Abs: 0.9 10*3/uL (ref 0.7–4.0)
MCH: 26.6 pg (ref 26.0–34.0)
MCHC: 31.6 g/dL (ref 30.0–36.0)
MCV: 84.3 fL (ref 80.0–100.0)
Monocytes Absolute: 0.8 10*3/uL (ref 0.1–1.0)
Monocytes Relative: 8 %
Neutro Abs: 8.5 10*3/uL — ABNORMAL HIGH (ref 1.7–7.7)
Neutrophils Relative %: 82 %
Platelets: 124 10*3/uL — ABNORMAL LOW (ref 150–400)
RBC: 2.93 MIL/uL — ABNORMAL LOW (ref 4.22–5.81)
RDW: 15.6 % — ABNORMAL HIGH (ref 11.5–15.5)
WBC: 10.3 10*3/uL (ref 4.0–10.5)
nRBC: 0 % (ref 0.0–0.2)

## 2018-09-22 LAB — BLOOD GAS, ARTERIAL
Acid-base deficit: 0.2 mmol/L (ref 0.0–2.0)
Bicarbonate: 22.6 mmol/L (ref 20.0–28.0)
Delivery systems: POSITIVE
Expiratory PAP: 5
FIO2: 0.4
Inspiratory PAP: 10
Mechanical Rate: 12
O2 Saturation: 99.8 %
Patient temperature: 37
pCO2 arterial: 31 mmHg — ABNORMAL LOW (ref 32.0–48.0)
pH, Arterial: 7.47 — ABNORMAL HIGH (ref 7.350–7.450)
pO2, Arterial: 202 mmHg — ABNORMAL HIGH (ref 83.0–108.0)

## 2018-09-22 LAB — BASIC METABOLIC PANEL
Anion gap: 15 (ref 5–15)
Anion gap: 13 (ref 5–15)
BUN: 54 mg/dL — ABNORMAL HIGH (ref 6–20)
BUN: 55 mg/dL — ABNORMAL HIGH (ref 6–20)
CO2: 20 mmol/L — ABNORMAL LOW (ref 22–32)
CO2: 22 mmol/L (ref 22–32)
Calcium: 8 mg/dL — ABNORMAL LOW (ref 8.9–10.3)
Calcium: 7.9 mg/dL — ABNORMAL LOW (ref 8.9–10.3)
Chloride: 107 mmol/L (ref 98–111)
Chloride: 107 mmol/L (ref 98–111)
Creatinine, Ser: 8.11 mg/dL — ABNORMAL HIGH (ref 0.61–1.24)
Creatinine, Ser: 8.89 mg/dL — ABNORMAL HIGH (ref 0.61–1.24)
GFR calc Af Amer: 8 mL/min — ABNORMAL LOW (ref >60)
GFR calc Af Amer: 7 mL/min — ABNORMAL LOW (ref >60)
GFR calc non Af Amer: 6 mL/min — ABNORMAL LOW (ref >60)
GFR calc non Af Amer: 6 mL/min — ABNORMAL LOW (ref >60)
Glucose, Bld: 258 mg/dL — ABNORMAL HIGH (ref 70–99)
Glucose, Bld: 222 mg/dL — ABNORMAL HIGH (ref 70–99)
Potassium: 3.1 mmol/L — ABNORMAL LOW (ref 3.5–5.1)
Potassium: 3.2 mmol/L — ABNORMAL LOW (ref 3.5–5.1)
Sodium: 142 mmol/L (ref 135–145)
Sodium: 142 mmol/L (ref 135–145)

## 2018-09-22 LAB — GLUCOSE, CAPILLARY
Glucose-Capillary: 168 mg/dL — ABNORMAL HIGH (ref 70–99)
Glucose-Capillary: 184 mg/dL — ABNORMAL HIGH (ref 70–99)
Glucose-Capillary: 194 mg/dL — ABNORMAL HIGH (ref 70–99)
Glucose-Capillary: 196 mg/dL — ABNORMAL HIGH (ref 70–99)
Glucose-Capillary: 200 mg/dL — ABNORMAL HIGH (ref 70–99)
Glucose-Capillary: 222 mg/dL — ABNORMAL HIGH (ref 70–99)
Glucose-Capillary: 230 mg/dL — ABNORMAL HIGH (ref 70–99)
Glucose-Capillary: 240 mg/dL — ABNORMAL HIGH (ref 70–99)
Glucose-Capillary: 253 mg/dL — ABNORMAL HIGH (ref 70–99)
Glucose-Capillary: 261 mg/dL — ABNORMAL HIGH (ref 70–99)
Glucose-Capillary: 311 mg/dL — ABNORMAL HIGH (ref 70–99)
Glucose-Capillary: 317 mg/dL — ABNORMAL HIGH (ref 70–99)
Glucose-Capillary: 331 mg/dL — ABNORMAL HIGH (ref 70–99)
Glucose-Capillary: 338 mg/dL — ABNORMAL HIGH (ref 70–99)
Glucose-Capillary: 351 mg/dL — ABNORMAL HIGH (ref 70–99)
Glucose-Capillary: 352 mg/dL — ABNORMAL HIGH (ref 70–99)
Glucose-Capillary: 353 mg/dL — ABNORMAL HIGH (ref 70–99)

## 2018-09-22 LAB — COMPREHENSIVE METABOLIC PANEL
ALT: 27 U/L (ref 0–44)
AST: 44 U/L — ABNORMAL HIGH (ref 15–41)
Albumin: 1.8 g/dL — ABNORMAL LOW (ref 3.5–5.0)
Alkaline Phosphatase: 62 U/L (ref 38–126)
Anion gap: 16 — ABNORMAL HIGH (ref 5–15)
BUN: 58 mg/dL — ABNORMAL HIGH (ref 6–20)
CO2: 16 mmol/L — ABNORMAL LOW (ref 22–32)
Calcium: 8.1 mg/dL — ABNORMAL LOW (ref 8.9–10.3)
Chloride: 108 mmol/L (ref 98–111)
Creatinine, Ser: 9.24 mg/dL — ABNORMAL HIGH (ref 0.61–1.24)
GFR calc Af Amer: 6 mL/min — ABNORMAL LOW (ref >60)
GFR calc non Af Amer: 6 mL/min — ABNORMAL LOW (ref >60)
Glucose, Bld: 303 mg/dL — ABNORMAL HIGH (ref 70–99)
Potassium: 3.5 mmol/L (ref 3.5–5.1)
Sodium: 140 mmol/L (ref 135–145)
Total Bilirubin: 0.8 mg/dL (ref 0.3–1.2)
Total Protein: 6.1 g/dL — ABNORMAL LOW (ref 6.5–8.1)

## 2018-09-22 LAB — AEROBIC/ANAEROBIC CULTURE W GRAM STAIN (SURGICAL/DEEP WOUND)
Culture: NO GROWTH
Culture: NO GROWTH
Culture: NO GROWTH

## 2018-09-22 LAB — HEPARIN LEVEL (UNFRACTIONATED)
Heparin Unfractionated: 0.28 IU/mL — ABNORMAL LOW (ref 0.30–0.70)
Heparin Unfractionated: 0.17 IU/mL — ABNORMAL LOW (ref 0.30–0.70)
Heparin Unfractionated: 0.41 IU/mL (ref 0.30–0.70)

## 2018-09-22 LAB — HEMOGLOBIN AND HEMATOCRIT, BLOOD
HCT: 24.5 % — ABNORMAL LOW (ref 39.0–52.0)
HCT: 24.7 % — ABNORMAL LOW (ref 39.0–52.0)
Hemoglobin: 7.8 g/dL — ABNORMAL LOW (ref 13.0–17.0)
Hemoglobin: 7.8 g/dL — ABNORMAL LOW (ref 13.0–17.0)

## 2018-09-22 LAB — HEPATITIS B SURFACE ANTIGEN: Hepatitis B Surface Ag: NEGATIVE

## 2018-09-22 LAB — CBC
HCT: 23.9 % — ABNORMAL LOW (ref 39.0–52.0)
Hemoglobin: 7.7 g/dL — ABNORMAL LOW (ref 13.0–17.0)
MCH: 26.8 pg (ref 26.0–34.0)
MCHC: 32.2 g/dL (ref 30.0–36.0)
MCV: 83.3 fL (ref 80.0–100.0)
Platelets: 107 10*3/uL — ABNORMAL LOW (ref 150–400)
RBC: 2.87 MIL/uL — ABNORMAL LOW (ref 4.22–5.81)
RDW: 15.5 % (ref 11.5–15.5)
WBC: 9.5 10*3/uL (ref 4.0–10.5)
nRBC: 0 % (ref 0.0–0.2)

## 2018-09-22 LAB — HEPATITIS B SURFACE ANTIBODY,QUALITATIVE: Hep B S Ab: NONREACTIVE

## 2018-09-22 LAB — HEPATITIS C ANTIBODY: HCV Ab: 0.2 s/co ratio (ref 0.0–0.9)

## 2018-09-22 LAB — MAGNESIUM: Magnesium: 2.2 mg/dL (ref 1.7–2.4)

## 2018-09-22 LAB — FUNGITELL, SERUM: Fungitell Result: <31 pg/mL (ref <80)

## 2018-09-22 LAB — HEPATITIS B CORE ANTIBODY, TOTAL: Hep B Core Total Ab: NEGATIVE

## 2018-09-22 LAB — AMMONIA: Ammonia: 10 umol/L (ref 9–35)

## 2018-09-22 LAB — PHOSPHORUS: Phosphorus: 4 mg/dL (ref 2.5–4.6)

## 2018-09-22 LAB — PROCALCITONIN: Procalcitonin: 3.93 ng/mL

## 2018-09-22 MED ORDER — INSULIN REGULAR(HUMAN) IN NACL 100-0.9 UT/100ML-% IV SOLN
INTRAVENOUS | Status: DC
  Administered 2018-09-22: 22:00:00 19 [IU]/h via INTRAVENOUS
  Administered 2018-09-22: 2.7 [IU]/h via INTRAVENOUS
  Filled 2018-09-22 (×3): qty 100

## 2018-09-22 MED ORDER — FOLIC ACID 5 MG/ML IJ SOLN
1.0000 mg | Freq: Every day | INTRAMUSCULAR | Status: DC
  Administered 2018-09-22 – 2018-09-25 (×4): 1 mg via INTRAVENOUS
  Filled 2018-09-22 (×9): qty 0.2

## 2018-09-22 MED ORDER — THIAMINE HCL 100 MG/ML IJ SOLN
100.0000 mg | Freq: Every day | INTRAMUSCULAR | Status: DC
  Administered 2018-09-22 – 2018-09-25 (×4): 100 mg via INTRAVENOUS
  Filled 2018-09-22 (×4): qty 2

## 2018-09-22 MED ORDER — FENTANYL CITRATE (PF) 100 MCG/2ML IJ SOLN
12.5000 ug | Freq: Four times a day (QID) | INTRAMUSCULAR | Status: AC
  Administered 2018-09-22 – 2018-09-23 (×2): 12.5 ug via INTRAVENOUS
  Filled 2018-09-22 (×2): qty 2

## 2018-09-22 MED ORDER — FUROSEMIDE 10 MG/ML IJ SOLN
10.0000 mg/h | INTRAVENOUS | Status: DC
  Filled 2018-09-22: qty 25

## 2018-09-22 MED ORDER — SODIUM CHLORIDE 0.9 % IV SOLN
INTRAVENOUS | Status: DC | PRN
  Administered 2018-09-22 – 2018-09-27 (×4): 250 mL via INTRAVENOUS

## 2018-09-22 MED ORDER — RENA-VITE PO TABS
1.0000 | ORAL_TABLET | Freq: Every day | ORAL | Status: DC
  Administered 2018-09-23 – 2018-09-26 (×4): 1 via ORAL
  Filled 2018-09-22 (×4): qty 1

## 2018-09-22 MED ORDER — NEPRO/CARBSTEADY PO LIQD
237.0000 mL | Freq: Two times a day (BID) | ORAL | Status: DC
  Administered 2018-09-23 – 2018-10-10 (×23): 237 mL via ORAL

## 2018-09-22 MED ORDER — FUROSEMIDE 10 MG/ML IJ SOLN: 80.0000 mg | INTRAMUSCULAR | Status: DC

## 2018-09-22 MED ORDER — HEPARIN BOLUS VIA INFUSION
3000.0000 [IU] | Freq: Once | INTRAVENOUS | Status: DC
  Filled 2018-09-22: qty 3000

## 2018-09-22 NOTE — Plan of Care (Signed)

## 2018-09-22 NOTE — Progress Notes (Signed)
[place] for heparin drip management Indication: DVT  Allergies  Allergen Reactions  . Lipitor [Atorvastatin] Hives and Itching  . Other   . Viagra  [Sildenafil Citrate]     vision loss    Patient Measurements: Height: 6\' 4"  (193 cm) Weight: (!) 334 lb 3.5 oz (151.6 kg) IBW/kg (Calculated) : 86.8 Heparin Dosing Weight: 120 kg  Vital Signs: Temp: 99.3 F (37.4 C) (06/05 1600) Temp Source: Axillary (06/05 1600) BP: 139/83 (06/05 1700) Pulse Rate: 111 (06/05 1700)  Labs: Recent Labs    09/20/18 0500 09/20/18 0948  09/20/18 1811  09/21/18 0120  09/21/18 2216 09/22/18 0020 09/22/18 0411 09/22/18 0700 09/22/18 1220 09/22/18 1640  HGB 8.6*  --   --   --    < > 8.7*   < >  --  7.8* 7.8* 7.8*  --   --   HCT 28.4*  --   --   --    < > 28.0*   < >  --  24.7* 24.7* 24.5*  --   --   PLT 354  --   --   --   --  276  --   --   --  124*  --   --   --   APTT  --   --   --  35  --   --   --   --   --   --   --   --   --   HEPARINUNFRC  --   --    < > <0.10*  --  <0.10*   < > 0.16*  --  0.28*  --  0.17*  --   CREATININE 5.98*  --   --   --   --  7.73*  --   --   --  9.24*  --   --  8.11*  CKTOTAL  --  992*  --   --   --   --   --   --   --   --   --   --   --    < > = values in this interval not displayed.    Estimated Creatinine Clearance: 15.4 mL/min (A) (by C-G formula based on SCr of 8.11 mg/dL (H)).   Medical History: Past Medical History:  Diagnosis Date  . Allergy   . Anxiety   . Arthritis    knees  . Asthma, mild intermittent, well-controlled   . Chronic obstructive asthma ([place])   . COPD (chronic obstructive pulmonary disease) ([place])   . Depression with anxiety   . Diabetes ([place])   . GERD (gastroesophageal reflux disease)   . HBP (high blood pressure)   . High cholesterol   . Renal cyst, right   . Sleep apnea    CPAP  . Swelling     Medications:  Scheduled:  . aspirin EC  81 mg Oral Daily  . budesonide (PULMICORT)  nebulizer solution  0.5 mg Nebulization BID  . Chlorhexidine Gluconate Cloth  6 each Topical Q0600  . [START ON 09/23/2018] feeding supplement (NEPRO CARB STEADY)  237 mL Oral BID BM  . fentaNYL (SUBLIMAZE) injection  12.5 mcg Intravenous L3J  . folic acid  1 mg Intravenous Daily  . heparin  3,000 Units Intravenous Once  . hydrocortisone sod succinate (SOLU-CORTEF) inj  50 mg Intravenous Q6H  . ipratropium-albuterol  3 mL Nebulization Q4H  . mouth rinse  15 mL Mouth  Rinse BID  . multivitamin  1 tablet Oral QHS  . multivitamin with minerals  1 tablet Oral Daily  . thiamine injection  100 mg Intravenous Daily  . tranexamic acid (CYKLOKAPRON) topical -INTRAOP  2,000 mg Topical Once   Infusions:  . sodium chloride Stopped (09/22/18 1559)  . anidulafungin 78 mL/hr at 09/22/18 1700  . doxycycline (VIBRAMYCIN) IV Stopped (09/22/18 1519)  . heparin 2,550 Units/hr (09/22/18 1700)  . insulin 16.1 mL/hr at 09/22/18 1700  . norepinephrine (LEVOPHED) Adult infusion Stopped (09/20/18 1953)  . piperacillin-tazobactam (ZOSYN)  IV Stopped (09/22/18 2725)  .  sodium bicarbonate (isotonic) infusion in sterile water 50 mL/hr at 09/22/18 1700    Assessment: Pharmacy consulted for heparin drip management for 58 yo male being treated with heparin for left popliteal vein DVT. Patient with bilateral knee replacement on 5/15. Patient admitted to [facility] on 5/26 with weakness and pain. Patient underwent I&D of left knee on 5/31 and right knee on 6/1. Patient prior to admission was on rivaroxaban 10mg . This was not continued in house and anti-Xa level was <0.1.   Goal of Therapy:  Heparin level 0.3-0.7 units/ml Monitor platelets by anticoagulation protocol: Yes   Plan:  Heparin level remains subtherapeutic. Confirmed with RN no interruptions in therapy. Will defer heparin bolus based on conversation with CCM and increase rate to 2800 units/hr. Will obtain follow-up anti-Xa level at 2330. Patient is to have serial  hematocrit and hemoglobins as ordered by ICU team.   Pharmacy will continue to monitor and adjust per consult.   Currie Paris, RPh 09/22/2018,5:19 PM

## 2018-09-22 NOTE — Progress Notes (Signed)
Post HD Tx  365mL net removal, tolerated tx well.   09/22/18 1530  Vital Signs  Temp 99.2 F (37.3 C)  Temp Source Axillary  Pulse Rate (!) 109  Pulse Rate Source Monitor  Resp (!) 29  BP 137/72  BP Location Left Arm  BP Method Automatic  Patient Position (if appropriate) Lying  Oxygen Therapy  SpO2 95 %  O2 Device Room Air  Pain Assessment  Pain Scale CPOT  Critical Care Pain Observation Tool (CPOT)  Facial Expression 0  Body Movements 0  Muscle Tension 0  Compliance with ventilator (intubated pts.) N/A  Vocalization (extubated pts.) 0  CPOT Total 0  Post-Hemodialysis Assessment  Rinseback Volume (mL) 250 mL  Dialyzer Clearance Lightly streaked  Duration of HD Treatment -hour(s) 2 hour(s)  Hemodialysis Intake (mL) 500 mL  UF Total -Machine (mL) 822 mL  Net UF (mL) 322 mL  Tolerated HD Treatment Yes  Hemodialysis Catheter Left Internal jugular Triple lumen Temporary  Placement Date/Time: 09/21/18 1100   Placed prior to admission: No  Time Out: Correct patient;Correct procedure;Correct site  Maximum sterile barrier precautions: Hand hygiene;Sterile gown;Cap;Sterile gloves;Large sterile sheet;Mask  Site Prep: Chlorh...  Site Condition No complications  Blue Lumen Status Flushed;Capped (Central line);Heparin locked  Red Lumen Status Flushed;Capped (Central line);Heparin locked  Purple Lumen Status Infusing  Dressing Type Biopatch  Dressing Status Clean;Dry;Intact  Drainage Description None  Dressing Change Due 09/23/18  Post treatment catheter status Capped and Clamped

## 2018-09-22 NOTE — Progress Notes (Signed)
Patient's wife at bedside at this time.

## 2018-09-22 NOTE — Progress Notes (Signed)
HD Tx End 380mL net UF removal over 2 hour tx. Tolerated well. More intentional movement, seems to be waking up somewhat following dialysis tx.     09/22/18 1515  Vital Signs  Pulse Rate (!) 107  Resp (!) 26  BP (!) 119/104  Oxygen Therapy  SpO2 91 %  During Hemodialysis Assessment  Blood Flow Rate (mL/min) 200 mL/min  Arterial Pressure (mmHg) -90 mmHg  Venous Pressure (mmHg) 260 mmHg  Transmembrane Pressure (mmHg) 40 mmHg  Ultrafiltration Rate (mL/min) 400 mL/min  Dialysate Flow Rate (mL/min) 300 ml/min  Conductivity: Machine  14  HD Safety Checks Performed Yes  Intra-Hemodialysis Comments Tx completed

## 2018-09-22 NOTE — Progress Notes (Signed)
Post HD Assessment    09/22/18 1545  Neurological  Level of Consciousness Responds to Voice  Orientation Level Disoriented X4  Respiratory  Respiratory Pattern Irregular  Chest Assessment Chest expansion symmetrical  Bilateral Breath Sounds Diminished  Cardiac  Pulse Regular  Heart Sounds S1, S2  Jugular Venous Distention (JVD) No  ECG Monitor Yes  Cardiac Rhythm ST  Antiarrhythmic device No  Vascular  R Radial Pulse +2  L Radial Pulse +2  R Dorsalis Pedis Pulse +1  L Dorsalis Pedis Pulse +1  Edema Right lower extremity;Left lower extremity;Generalized  Generalized Edema +1  RLE Edema +2  LLE Edema +2  Integumentary  Integumentary (WDL) X  Skin Color Appropriate for ethnicity  Skin Condition Dry  Skin Integrity Surgical Incision (see LDA)  Musculoskeletal  Musculoskeletal (WDL) X  Generalized Weakness Yes  Gastrointestinal  Bowel Sounds Assessment Active  GU Assessment  Genitourinary (WDL) X  Genitourinary Symptoms Urinary Catheter (HD pt)  Urine Characteristics  Urine Color Yellow/straw  Urine Appearance Clear  Urethral Catheter Elizebeth Koller, RN Latex 16 Fr.  Placement Date/Time: 09/20/18 1730   Perineal care performed prior to insertion?: Yes  Person Inserting Catheter: Elizebeth Koller, RN  Person Assisting with Catheter Insertion: Sh[place], NT  Patient Location at Time of Insertion: ICU 20  Catheter Type: Late...  Indication for Insertion or Continuance of Catheter Therapy based on hourly urine output monitoring and documentation for critical condition (NOT STRICT I&O)  Site Assessment Clean;Intact  Catheter Maintenance Bag below level of bladder;Insertion date on drainage bag;Bag emptied prior to transport;No dependent loops;Seal intact;Drainage bag/tubing not touching floor;Catheter secured  Collection Container Standard drainage bag  Securement Method Securing device (Describe)  Urinary Catheter Interventions Unclamped  Psychosocial  Psychosocial (WDL) X  Patient  Behaviors Calm;Not interactive  Needs Expressed Denies  Emotional support given Given to patient  Wound/Incision (LDAs)  Type of Wound/Incision (LDA) Incision  Incision (Closed) Knee Right;Left;Bilateral  No Date First Assessed or Time First Assessed found.   Location: Knee  Location Orientation: Right;Left;Bilateral  Present on Admission: Yes  Dressing Type Other (Comment) (ACE wrap and polar care)  Dressing Clean;Dry;Intact  Site / Wound Assessment Dressing in place / Unable to assess  Incision (Closed) 09/17/18 Knee Left  Date First Assessed/Time First Assessed: 09/17/18 1301   Location: Knee  Location Orientation: Left  Dressing Type Other (Comment) (ACE wrap and polar care)  Dressing Clean;Dry;Intact  Site / Wound Assessment Dressing in place / Unable to assess  Incision (Closed) 09/18/18 Knee Right  Date First Assessed/Time First Assessed: 09/18/18 2158   Location: Knee  Location Orientation: Right  Dressing Type Other (Comment) (ACE wrap and polar care)  Dressing Clean;Dry;Intact  Site / Wound Assessment Dressing in place / Unable to assess

## 2018-09-22 NOTE — Progress Notes (Signed)
PT Cancellation Note  Patient Details Name: Isaac Cross MRN: [id number] DOB: [birth year]   Cancelled Treatment:    Reason Eval/Treat Not Completed: Medical issues which prohibited therapy.  Chart reviewed.  Per notes, pt critically ill; creatinine also critically elevated with plan for dialysis today.  Per discussion with pt's nurse (regarding pt's medical status and appropriateness for PT), will discontinue pt's physical therapy order (pt not appropriate for PT d/t medical status).  Please re-consult PT when pt is medically appropriate to participate in physical therapy.  Leitha Bleak, PT 09/22/18, 12:33 PM [phone]

## 2018-09-22 NOTE — Progress Notes (Signed)
[place] [facility] [place] NAME: Isaac Cross    MR#:  [id number]  DATE OF BIRTH:  [birth year]  SUBJECTIVE:  CHIEF COMPLAINT:   Chief Complaint  Patient presents with   Dizziness   -  Appears critically ill, very confused -T-max is improved in the last 48 hours.  Has a left leg clot on heparin gtt -Renal function worsening, decreased urine output- to be started on HD today  REVIEW OF SYSTEMS:  Review of Systems  Unable to perform ROS: Critical illness   DRUG ALLERGIES:   Allergies  Allergen Reactions   Lipitor [Atorvastatin] Hives and Itching   Other    Viagra  [Sildenafil Citrate]     vision loss    VITALS:  Blood pressure 128/86, pulse (!) 114, temperature 98.3 F (36.8 C), temperature source Axillary, resp. rate (!) 24, height 6\' 4"  (1.93 m), weight (!) 145.2 kg, SpO2 98 %.  PHYSICAL EXAMINATION:  Physical Exam   GENERAL:  58 y.o.-year-old patient lying in the bed with no acute distress. Critically ill appearing EYES: Pupils equal, round, reactive to light and accommodation. No scleral icterus. Extraocular muscles intact.  HEENT: Head atraumatic, normocephalic. Oropharynx and nasopharynx clear.  NECK:  Supple, no jugular venous distention. No thyroid enlargement, no tenderness.  LUNGS: Normal breath sounds bilaterally, no wheezing, rales,rhonchi or crepitation. No use of accessory muscles of respiration.  Decreased bibasilar breath sounds CARDIOVASCULAR: S1, S2 normal. No murmurs, rubs, or gallops. left chest dialysis catheter noted ABDOMEN: Soft, nontender, nondistended. Bowel sounds present. No organomegaly or mass.  EXTREMITIES: Swelling of both legs, knees noted with recent surgery.  No  cyanosis, or clubbing.  myoclonic jerks noted Both hands are swollen as well. NEUROLOGIC: Cranial nerves II through XII are intact.  Extremely confused.  Not following commands.  Myoclonic jerks noted.  Withdrawing to pain. PSYCHIATRIC:  The patient is alert and disoriented.  Intermittent confusion noted SKIN: No obvious rash, lesion, or ulcer.    LABORATORY PANEL:   CBC Recent Labs  Lab 09/22/18 0411 09/22/18 0700  WBC 10.3  --   HGB 7.8* 7.8*  HCT 24.7* 24.5*  PLT 124*  --    ------------------------------------------------------------------------------------------------------------------  Chemistries  Recent Labs  Lab 09/21/18 0120 09/22/18 0411  NA 139 140  K 4.3 3.5  CL 111 108  CO2 17* 16*  GLUCOSE 192* 303*  BUN 37* 58*  CREATININE 7.73* 9.24*  CALCIUM 8.3* 8.1*  MG 2.4  --   AST  --  44*  ALT  --  27  ALKPHOS  --  62  BILITOT  --  0.8   ------------------------------------------------------------------------------------------------------------------  Cardiac Enzymes No results for input(s): TROPONINI in the last 168 hours. ------------------------------------------------------------------------------------------------------------------  RADIOLOGY:  US Venous Img Lower Bilateral  Result Date: 09/20/2018 CLINICAL DATA:  Bilateral lower extremity edema. History of knee surgery. Evaluate for DVT. EXAM: BILATERAL LOWER EXTREMITY VENOUS DOPPLER ULTRASOUND TECHNIQUE: Gray-scale sonography with graded compression, as well as color Doppler and duplex ultrasound were performed to evaluate the lower extremity deep venous systems from the level of the common femoral vein and including the common femoral, femoral, profunda femoral, popliteal and calf veins including the posterior tibial, peroneal and gastrocnemius veins when visible. The superficial great saphenous vein was also interrogated. Spectral Doppler was utilized to evaluate flow at rest and with distal augmentation maneuvers in the common femoral, femoral and popliteal veins. COMPARISON:  None. FINDINGS: Examination is degraded due to patient body habitus  and poor sonographic window. RIGHT LOWER EXTREMITY Common Femoral Vein: No evidence of thrombus.  Normal compressibility, respiratory phasicity and response to augmentation. Saphenofemoral Junction: No evidence of thrombus. Normal compressibility and flow on color Doppler imaging. Profunda Femoral Vein: No evidence of thrombus. Normal compressibility and flow on color Doppler imaging. Femoral Vein: No evidence of thrombus. Normal compressibility, respiratory phasicity and response to augmentation. Popliteal Vein: No evidence of thrombus. Normal compressibility, respiratory phasicity and response to augmentation. Calf Veins: Appear patent where imaged. Superficial Great Saphenous Vein: No evidence of thrombus. Normal compressibility. Other Findings: Note is made of a right inguinal lymph node which is not enlarged by size criteria measuring 1 cm greatest short axis diameter and maintains a benign fatty hila. LEFT LOWER EXTREMITY Common Femoral Vein: No evidence of thrombus. Normal compressibility, respiratory phasicity and response to augmentation. Saphenofemoral Junction: No evidence of thrombus. Normal compressibility and flow on color Doppler imaging. Profunda Femoral Vein: No evidence of thrombus. Normal compressibility and flow on color Doppler imaging. Femoral Vein: No evidence of thrombus. Normal compressibility, respiratory phasicity and response to augmentation. Popliteal Vein: There is hypoechoic near occlusive thrombus within the distal aspect the left popliteal vein (images 25 through 28). Calf Veins: Appear patent where imaged. Superficial Great Saphenous Vein: No evidence of thrombus. Normal compressibility. Other Findings: Note is made of an approximately 5.2 x 3.2 x 1.5 cm mixed echogenic serpiginous fluid collection with left popliteal fossa compatible with a Baker cyst. IMPRESSION: 1. The examination is positive for near occlusive DVT involving the distal aspect of the left popliteal vein. 2. No evidence of DVT within the right lower extremity. 3. Note made of an approximately 5.2 cm complex  left-sided Baker's cyst. Electronically Signed   By: Sandi Mariscal M.D.   On: 09/20/2018 17:31   Dg Chest Port 1 View  Result Date: 09/21/2018 CLINICAL DATA:  Central line placement EXAM: PORTABLE CHEST 1 VIEW COMPARISON:  09/21/2018 FINDINGS: Interval placement of a left neck multi lumen vascular catheter, tip projecting in the vicinity of the brachiocephalic confluence. Otherwise unchanged low volume AP portable examination with cardiomegaly. No acute appearing airspace disease. IMPRESSION: Interval placement of a left neck multi lumen vascular catheter, tip projecting in the vicinity of the brachiocephalic confluence. Otherwise unchanged low volume AP portable examination with cardiomegaly. No acute appearing airspace disease. Electronically Signed   By: Eddie Candle M.D.   On: 09/21/2018 11:38   Dg Chest Port 1 View  Result Date: 09/21/2018 CLINICAL DATA:  Acute respiratory failure EXAM: PORTABLE CHEST 1 VIEW COMPARISON:  09/20/2018 FINDINGS: Cardiac shadow is stable. Lungs are well aerated bilaterally. No focal infiltrate or sizable effusion is seen. No bony abnormality is noted. IMPRESSION: No acute abnormality noted. Electronically Signed   By: Inez Catalina M.D.   On: 09/21/2018 07:46    EKG:   Orders placed or performed during the hospital encounter of 09/12/18   EKG 12-Lead   EKG 12-Lead    ASSESSMENT AND PLAN:    EdwardGradyis a58 y.o.malewith a known history of arthritis, COPD not on home oxygen, hypertension, sleep apnea, non-insulin-dependent diabetes mellitus presents to hospital secondary to dizziness, weakness and a fall  1.Sepsis- with ongoing fevers. T max is improving now   -s/p Recent bilateral knee replacement surgeries as outpatient and now bilateral knee washouts done this adm.  Cultures are negative so far.  Fungal cultures are pending -Appreciate ID consult.  Patient on zosyn, doxycycline and anidulafungin to cover fungal orgms - ID consult  appreciated. - If  spiking fevers again- Will need a CT of his chest abdomen and pelvis when stable.   -Off pressors today.  Received IV albumin as well.  Remains on stress dose steroids  2. Acute renal failure-likely ATN from sepsis and hypotension -Much worsening creatinine and decreased urine output - appreciate nephrology input. -Patient getting more encephalopathic.  Creatinine greater than 9, even though potassium is within normal limits, metabolic acidosis noted with low bicarb. -Hemodialysis will be started today, dialysis catheter placed. -Hold lisinopril and metformin and other nephrotoxins.     3.  Acute encephalopathy-secondary to metabolic causes, sepsis and uremia.  Monitor closely.  No focal deficits  4. Left popliteal vein DVT- on heparin drip  5. Diabetes mellitus- sliding scale insulin and Lantus has been added while inpatient - insulin drip to be started today -Hold metformin given worsening renal failure.  6. DVT prophylaxis- heparin drip  Physical therapy consult when more stable Patient is critically ill   All the records are reviewed and case discussed with Care Management/Social Workerr. Management plans discussed with the patient, family and they are in agreement.  CODE STATUS: Full code  TOTAL TIME TAKING CARE OF THIS PATIENT: 38 minutes.   POSSIBLE D/C IN 3-4 DAYS, DEPENDING ON CLINICAL CONDITION.   Gladstone Lighter M.D on 09/22/2018 at 12:25 PM  Between 7am to 6pm - Pager - [phone]  After 6pm go to [email or web address] - password [email or web address] [facility]  Office  [phone]  CC: Primary care physician; Steele Sizer, MD

## 2018-09-22 NOTE — Progress Notes (Signed)
HD Tx Start  Wife at bedside pre-tx. Insulin drip w/ Qhr checks. Heparin drip for DVT. Evident tremor and and confusion, encephalopathy. BP stable, HR sinus tachy. Respirations irregular, coincide with tremor. Afebrile, sats 98-100 on room air. Noted elevation in venous pressure after catheter trouble shooting, required reversal of lumens and VP of machine remains elevated -- likely d/t being new cath / vascular swelling from placement (cath placed yesterday).      09/22/18 1315  Vital Signs  Pulse Rate (!) 113  Resp (!) 24  BP 130/72  Oxygen Therapy  SpO2 97 %  During Hemodialysis Assessment  Blood Flow Rate (mL/min) 200 mL/min  Arterial Pressure (mmHg) -90 mmHg  Venous Pressure (mmHg) 250 mmHg  Transmembrane Pressure (mmHg) 40 mmHg  Ultrafiltration Rate (mL/min) 400 mL/min (400 mL per HOUR removal)  Dialysate Flow Rate (mL/min) 300 ml/min  Conductivity: Machine  14  HD Safety Checks Performed Yes  Dialysis Fluid Bolus Normal Saline  Bolus Amount (mL) 250 mL  Intra-Hemodialysis Comments Tx initiated  Hemodialysis Catheter Left Internal jugular Triple lumen Temporary  Placement Date/Time: 09/21/18 1100   Placed prior to admission: No  Time Out: Correct patient;Correct procedure;Correct site  Maximum sterile barrier precautions: Hand hygiene;Sterile gown;Cap;Sterile gloves;Large sterile sheet;Mask  Site Prep: Chlorh...  Site Condition No complications  Blue Lumen Status Infusing  Red Lumen Status Infusing

## 2018-09-22 NOTE — Progress Notes (Signed)
CRITICAL CARE NOTE  CC  follow up septic shock  SUBJECTIVE Patient remains critically ill Prognosis is guarded On CIWA Sepsis/low grade fevers Heparin for DVT and bicarb infusion creatinine worsening Need HD today   BP 123/82   Pulse (!) 110   Temp 98.2 F (36.8 C) (Axillary)   Resp (!) 26   Ht 6\' 4"  (1.93 m)   Wt (!) 145.2 kg   SpO2 98%   BMI 38.95 kg/m    I/O last 3 completed shifts: In: 4595.9 [I.V.:3522; IV Piggyback:1073.9] Out: 550 [Urine:350; Stool:200] No intake/output data recorded.  SpO2: 98 % O2 Flow Rate (L/min): 6 L/min   SIGNIFICANT EVENTS   REVIEW OF SYSTEMS  PATIENT IS UNABLE TO PROVIDE COMPLETE REVIEW OF SYSTEMS DUE TO SEVERE CRITICAL ILLNESS   PHYSICAL EXAMINATION:  GENERAL:critically ill appearing,  HEAD: Normocephalic, atraumatic.  EYES: Pupils equal, round, reactive to light.  No scleral icterus.  MOUTH: Moist mucosal membrane. NECK: Supple. No thyromegaly. No nodules. No JVD.  PULMONARY: +rhonchi, +wheezing CARDIOVASCULAR: S1 and S2. Regular rate and rhythm. No murmurs, rubs, or gallops.  GASTROINTESTINAL: Soft, nontender, -distended. No masses. Positive bowel sounds. No hepatosplenomegaly.  MUSCULOSKELETAL: No swelling, clubbing, or edema.  NEUROLOGIC: obtunded, SKIN:intact,warm,dry  MEDICATIONS: I have reviewed all medications and confirmed regimen as documented   CULTURE RESULTS   Recent Results (from the past 240 hour(s))  Blood Culture (routine x 2)     Status: None   Collection Time: 09/12/18  9:56 AM  Result Value Ref Range Status   Specimen Description BLOOD LEFT HAND  Final   Special Requests   Final    BOTTLES DRAWN AEROBIC AND ANAEROBIC Blood Culture adequate volume   Culture   Final    NO GROWTH 5 DAYS Performed at [facility], [address], [place], [place] [address]    Report Status 09/17/2018 FINAL  Final  Urine culture     Status: None   Collection Time: 09/12/18 10:01 AM  Result Value Ref  Range Status   Specimen Description   Final    URINE, RANDOM Performed at [facility], [address], [place], [place] [address]    Special Requests   Final    NONE Performed at [facility], [address], [place], [place] [address]    Culture   Final    NO GROWTH Performed at [facility], [place] [address], [place], [place] [address]    Report Status 09/13/2018 FINAL  Final  SARS Coronavirus 2 (CEPHEID- Performed in [facility]), Hosp Order     Status: None   Collection Time: 09/12/18 10:02 AM  Result Value Ref Range Status   SARS Coronavirus 2 NEGATIVE NEGATIVE Final    Comment: (NOTE) If result is NEGATIVE SARS-CoV-2 target nucleic acids are NOT DETECTED. The SARS-CoV-2 RNA is generally detectable in upper and lower  respiratory specimens during the acute phase of infection. The lowest  concentration of SARS-CoV-2 viral copies this assay can detect is 250  copies / mL. A negative result does not preclude SARS-CoV-2 infection  and should not be used as the sole basis for treatment or other  patient management decisions.  A negative result may occur with  improper specimen collection / handling, submission of specimen other  than nasopharyngeal swab, presence of viral mutation(s) within the  areas targeted by this assay, and inadequate number of viral copies  (<250 copies / mL). A negative result must be combined with clinical  observations, patient history, and epidemiological information. If result is POSITIVE SARS-CoV-2 target nucleic acids are DETECTED. The SARS-CoV-2 RNA is generally detectable in upper and lower  respiratory specimens dur ing the acute phase of infection.  Positive  results are indicative of active infection with SARS-CoV-2.  Clinical  correlation with patient history and other diagnostic information is  necessary to determine patient infection status.  Positive results do  not rule out bacterial infection or  co-infection with other viruses. If result is PRESUMPTIVE POSTIVE SARS-CoV-2 nucleic acids MAY BE PRESENT.   A presumptive positive result was obtained on the submitted specimen  and confirmed on repeat testing.  While 2019 novel coronavirus  (SARS-CoV-2) nucleic acids may be present in the submitted sample  additional confirmatory testing may be necessary for epidemiological  and / or clinical management purposes  to differentiate between  SARS-CoV-2 and other Sarbecovirus currently known to infect humans.  If clinically indicated additional testing with an alternate test  methodology [id number]) is advised. The SARS-CoV-2 RNA is generally  detectable in upper and lower respiratory sp ecimens during the acute  phase of infection. The expected result is Negative. Fact Sheet for Patients:  StrictlyIdeas.no Fact Sheet for Healthcare Providers: BankingDealers.co.za This test is not yet approved or cleared by the [place] FDA and has been authorized for detection and/or diagnosis of SARS-CoV-2 by FDA under an Emergency Use Authorization (EUA).  This EUA will remain in effect (meaning this test can be used) for the duration of the COVID-19 declaration under Section 564(b)(1) of the Act, 21 U.S.C. section 360bbb-3(b)(1), unless the authorization is terminated or revoked sooner. Performed at [facility], [address] [place], [place] [address]   Blood Culture (routine x 2)     Status: None   Collection Time: 09/12/18 10:06 AM  Result Value Ref Range Status   Specimen Description BLOOD LEFT ANTECUBITAL  Final   Special Requests   Final    BOTTLES DRAWN AEROBIC AND ANAEROBIC Blood Culture adequate volume   Culture   Final    NO GROWTH 5 DAYS Performed at [facility], [address] [place], [place] [address]    Report Status 09/17/2018 FINAL  Final  MRSA PCR Screening     Status: None   Collection Time: 09/12/18   7:04 PM  Result Value Ref Range Status   MRSA by PCR NEGATIVE NEGATIVE Final    Comment:        The GeneXpert MRSA Assay (FDA approved for NASAL specimens only), is one component of a comprehensive MRSA colonization surveillance program. It is not intended to diagnose MRSA infection nor to guide or monitor treatment for MRSA infections. Performed at [facility], [address] [place], [place] [address]   C difficile quick scan w PCR reflex     Status: None   Collection Time: 09/14/18  2:21 PM  Result Value Ref Range Status   C Diff antigen NEGATIVE NEGATIVE Final   C Diff toxin NEGATIVE NEGATIVE Final   C Diff interpretation No C. difficile detected.  Final    Comment: Performed at [facility], [address] [place], [place] [address]  Group A Strep by PCR     Status: None   Collection Time: 09/15/18  1:53 PM  Result Value Ref Range Status   Group A Strep by PCR NOT DETECTED NOT DETECTED Final    Comment: Performed at [facility], [address], [place], [place] [address]  Body  fluid culture     Status: None   Collection Time: 09/15/18  5:13 PM  Result Value Ref Range Status   Specimen Description   Final    KNEE Performed at [facility], [address] [place], [place] [address]    Special Requests   Final    NONE Performed at [facility], [address] [place], [place] [address]    Gram Stain   Final    RARE RBCS FEW WBC SEEN NO ORGANISMS SEEN Performed at [facility], [address] [place], [place] [address]    Culture   Final    NO GROWTH 3 DAYS Performed at [facility], [place] [address], [place], [place] [address]    Report Status 09/19/2018 FINAL  Final  Body fluid culture     Status: None   Collection Time: 09/16/18  8:55 AM  Result Value Ref Range Status   Specimen Description KNEE RIGHT  Final   Special Requests NONE  Final   Gram Stain   Final    RARE WBC PRESENT,  PREDOMINANTLY PMN NO ORGANISMS SEEN    Culture   Final    NO GROWTH 3 DAYS Performed at [facility], [place] [address] [place], [place] [address]    Report Status 09/20/2018 FINAL  Final  Aerobic/Anaerobic Culture (surgical/deep wound)     Status: None (Preliminary result)   Collection Time: 09/17/18 11:32 AM  Result Value Ref Range Status   Specimen Description   Final    TISSUE LEFT KNEE 1 Performed at [facility], [address] [place], [place] [address]    Special Requests PATIENT ON FOLLOWING MAXIPIME VANCOMYCIN  Final   Gram Stain   Final    FEW WBC PRESENT,BOTH PMN AND MONONUCLEAR NO ORGANISMS SEEN    Culture   Final    NO GROWTH 4 DAYS NO ANAEROBES ISOLATED; CULTURE IN PROGRESS FOR 5 DAYS Performed at [facility], [place] [address] [place], [place] [address]    Report Status PENDING  Incomplete  Acid Fast Smear (AFB)     Status: None   Collection Time: 09/17/18 11:32 AM  Result Value Ref Range Status   AFB Specimen Processing Comment  Final    Comment: Tissue Grinding and Digestion/Decontamination   Acid Fast Smear Negative  Final    Comment: (NOTE) Performed At: [facility] [place], [place] [id number] Rush Farmer MD [id number]    Source (AFB) TISSUE  Final    Comment: LEFT KNEE Performed at [facility], [place] [address] [place], [place] [address]   Culture, fungus without smear     Status: None (Preliminary result)   Collection Time: 09/17/18 11:34 AM  Result Value Ref Range Status   Specimen Description   Final    SYNOVIAL Performed at [facility], [address] [place], [place] [address]    Special Requests   Final    NONE Performed at [facility], [address] [place], [place] [address]    Culture   Final    NO FUNGUS ISOLATED AFTER 1 DAY Performed at [facility], [place] [address], [place],  [address]    Report Status PENDING  Incomplete  Acid Fast  Culture with reflexed sensitivities     Status: None   Collection Time: 09/17/18 11:36 AM  Result Value Ref Range Status   Acid Fast Culture [facility]  Final  Comment: (NOTE) Test cancelled at client's request.      Cancelled per Levin Erp 09/21/2018 Performed At: [facility] [place], [place] [id number] Rush Farmer MD [id number]    Source of Sample TISSUE  Final    Comment: RIGHT KNEE Performed at [facility], [place] [address], [place], [place] [address]   Aerobic/Anaerobic Culture (surgical/deep wound)     Status: None (Preliminary result)   Collection Time: 09/17/18 11:43 AM  Result Value Ref Range Status   Specimen Description   Final    TISSUE LEFT KNEE 2 Performed at [facility], [address], [place], [place] [address]    Special Requests PATIENT ON FOLLOWING MAXIPIME VANCOMYCIN  Final   Gram Stain   Final    FEW WBC PRESENT, PREDOMINANTLY PMN NO ORGANISMS SEEN    Culture   Final    NO GROWTH 4 DAYS NO ANAEROBES ISOLATED; CULTURE IN PROGRESS FOR 5 DAYS Performed at [facility], [place] [address], [place], [place] [address]    Report Status PENDING  Incomplete  Aerobic/Anaerobic Culture (surgical/deep wound)     Status: None (Preliminary result)   Collection Time: 09/17/18 11:44 AM  Result Value Ref Range Status   Specimen Description   Final    TISSUE LEFT KNEE 3 Performed at [facility], [address], [place], [place] [address]    Special Requests PATIENT ON FOLLOWING MAXIPIME VANCOMYCIN  Final   Gram Stain   Final    MODERATE WBC PRESENT, PREDOMINANTLY PMN NO ORGANISMS SEEN    Culture   Final    NO GROWTH 4 DAYS NO ANAEROBES ISOLATED; CULTURE IN PROGRESS FOR 5 DAYS Performed at [facility], [place] [address], [place], [place] [address]    Report Status PENDING  Incomplete  Aerobic/Anaerobic Culture (surgical/deep wound)     Status: None (Preliminary result)   Collection Time: 09/18/18  9:43  PM  Result Value Ref Range Status   Specimen Description   Final    SYNOVIAL Performed at [facility], [address], [place], [place] [address]    Special Requests   Final    NONE Performed at [facility], [address] [place], [place] [address]    Gram Stain   Final    RARE WBC PRESENT, PREDOMINANTLY MONONUCLEAR NO ORGANISMS SEEN    Culture   Final    NO GROWTH 2 DAYS NO ANAEROBES ISOLATED; CULTURE IN PROGRESS FOR 5 DAYS Performed at [facility], [place] [address] [place], [place] [address]    Report Status PENDING  Incomplete  Aerobic/Anaerobic Culture (surgical/deep wound)     Status: None (Preliminary result)   Collection Time: 09/18/18  9:44 PM  Result Value Ref Range Status   Specimen Description   Final    SYNOVIAL Performed at [facility], [address], [place], [place] [address]    Special Requests   Final    RIGHT KNEE Performed at [facility], [address] [place], [place] [address]    Gram Stain   Final    FEW WBC PRESENT,BOTH PMN AND MONONUCLEAR NO ORGANISMS SEEN    Culture   Final    NO GROWTH 2 DAYS NO ANAEROBES ISOLATED; CULTURE IN PROGRESS FOR 5 DAYS Performed at [facility], [place] [address] [place],  [address]    Report Status PENDING  Incomplete  Aerobic/Anaerobic Culture (surgical/deep wound)     Status:  None (Preliminary result)   Collection Time: 09/18/18  9:45 PM  Result Value Ref Range Status   Specimen Description   Final    SYNOVIAL Performed at [facility], [address] [place], [place] [address]    Special Requests   Final    RIGHT KNEE Performed at [facility], [address] [place], [place] [address]    Gram Stain   Final    FEW WBC PRESENT, PREDOMINANTLY MONONUCLEAR NO ORGANISMS SEEN    Culture   Final    NO GROWTH 2 DAYS NO ANAEROBES ISOLATED; CULTURE IN PROGRESS FOR 5 DAYS Performed at [facility], [place] [address]  [place], [place] [address]    Report Status PENDING  Incomplete  Culture, blood (Routine X 2) w Reflex to ID Panel     Status: None (Preliminary result)   Collection Time: 09/19/18  1:11 PM  Result Value Ref Range Status   Specimen Description BLOOD LEFT ANTECUBITAL  Final   Special Requests   Final    BOTTLES DRAWN AEROBIC AND ANAEROBIC Blood Culture results may not be optimal due to an excessive volume of blood received in culture bottles   Culture   Final    NO GROWTH 3 DAYS Performed at [facility], [address] [place], [place] [address]    Report Status PENDING  Incomplete  Culture, blood (Routine X 2) w Reflex to ID Panel     Status: None (Preliminary result)   Collection Time: 09/19/18  1:20 PM  Result Value Ref Range Status   Specimen Description BLOOD BLOOD RIGHT HAND  Final   Special Requests   Final    BOTTLES DRAWN AEROBIC AND ANAEROBIC Blood Culture results may not be optimal due to an excessive volume of blood received in culture bottles   Culture   Final    NO GROWTH 3 DAYS Performed at [facility], [address] [place], [place] [address]    Report Status PENDING  Incomplete  Urine Culture     Status: None   Collection Time: 09/20/18  3:59 PM  Result Value Ref Range Status   Specimen Description   Final    URINE, RANDOM Performed at [facility], [address] [place], [place] [address]    Special Requests   Final    NONE Performed at [facility], [address] [place], [place] [address]    Culture   Final    NO GROWTH Performed at [facility], [place] [address], [place], [place] [address]    Report Status 09/21/2018 FINAL  Final  C difficile quick scan w PCR reflex     Status: None   Collection Time: 09/21/18  3:27 PM  Result Value Ref Range Status   C Diff antigen NEGATIVE NEGATIVE Final   C Diff toxin NEGATIVE NEGATIVE Final   C Diff interpretation No C. difficile detected.  Final    Comment:  Performed at [facility], [address] [place],  [address]  Gastrointestinal Panel by PCR , Stool     Status: None   Collection Time: 09/21/18  3:27 PM  Result Value Ref Range Status   Campylobacter species NOT DETECTED NOT DETECTED Final   Plesimonas shigelloides NOT DETECTED NOT DETECTED Final   Salmonella species NOT DETECTED NOT DETECTED Final   Yersinia enterocolitica NOT DETECTED NOT DETECTED Final   Vibrio species NOT DETECTED NOT DETECTED Final   Vibrio cholerae NOT  DETECTED NOT DETECTED Final   Enteroaggregative E coli (EAEC) NOT DETECTED NOT DETECTED Final   Enteropathogenic E coli (EPEC) NOT DETECTED NOT DETECTED Final   Enterotoxigenic E coli (ETEC) NOT DETECTED NOT DETECTED Final   Shiga like toxin producing E coli (STEC) NOT DETECTED NOT DETECTED Final   Shigella/Enteroinvasive E coli (EIEC) NOT DETECTED NOT DETECTED Final   Cryptosporidium NOT DETECTED NOT DETECTED Final   Cyclospora cayetanensis NOT DETECTED NOT DETECTED Final   Entamoeba histolytica NOT DETECTED NOT DETECTED Final   Giardia lamblia NOT DETECTED NOT DETECTED Final   Adenovirus F40/41 NOT DETECTED NOT DETECTED Final   Astrovirus NOT DETECTED NOT DETECTED Final   Norovirus GI/GII NOT DETECTED NOT DETECTED Final   Rotavirus A NOT DETECTED NOT DETECTED Final   Sapovirus (I, II, IV, and V) NOT DETECTED NOT DETECTED Final    Comment: Performed at [facility], [address] [place], [place] [address]        CBC    Component Value Date/Time   WBC 10.3 09/22/2018 0411   RBC 2.93 (L) 09/22/2018 0411   HGB 7.8 (L) 09/22/2018 0411   HCT 24.7 (L) 09/22/2018 0411   PLT 124 (L) 09/22/2018 0411   MCV 84.3 09/22/2018 0411   MCH 26.6 09/22/2018 0411   MCHC 31.6 09/22/2018 0411   RDW 15.6 (H) 09/22/2018 0411   LYMPHSABS 0.9 09/22/2018 0411   MONOABS 0.8 09/22/2018 0411   EOSABS 0.0 09/22/2018 0411   BASOSABS 0.0 09/22/2018 0411    BMP Latest Ref Rng & Units 09/22/2018  09/21/2018 09/20/2018  Glucose 70 - 99 mg/dL 303(H) 192(H) 174(H)  BUN 6 - 20 mg/dL 58(H) 37(H) 30(H)  Creatinine 0.61 - 1.24 mg/dL 9.24(H) 7.73(H) 5.98(H)  BUN/Creat Ratio 6 - 22 (calc) - - -  Sodium 135 - 145 mmol/L 140 139 137  Potassium 3.5 - 5.1 mmol/L 3.5 4.3 3.9  Chloride 98 - 111 mmol/L 108 111 107  CO2 22 - 32 mmol/L 16(L) 17(L) 19(L)  Calcium 8.9 - 10.3 mg/dL 8.1(L) 8.3(L) 8.2(L)      IMAGING    Dg Chest Port 1 View  Result Date: 09/21/2018 CLINICAL DATA:  Central line placement EXAM: PORTABLE CHEST 1 VIEW COMPARISON:  09/21/2018 FINDINGS: Interval placement of a left neck multi lumen vascular catheter, tip projecting in the vicinity of the brachiocephalic confluence. Otherwise unchanged low volume AP portable examination with cardiomegaly. No acute appearing airspace disease. IMPRESSION: Interval placement of a left neck multi lumen vascular catheter, tip projecting in the vicinity of the brachiocephalic confluence. Otherwise unchanged low volume AP portable examination with cardiomegaly. No acute appearing airspace disease. Electronically Signed   By: Eddie Candle M.D.   On: 09/21/2018 11:38       Indwelling Urinary Catheter continued, requirement due to   Reason to continue Indwelling Urinary Catheter strict Intake/Output monitoring for hemodynamic instability   Central Line/ continued, requirement due to  Reason to continue [place] of central venous pressure or other hemodynamic parameters and poor IV access        ASSESSMENT AND PLAN SYNOPSIS  Admitted for severe sepsis from infected LEFT knee s/p knee replacements-s/p multiple knee wash outs complicated by severe encephalopathy from DT's with h/o extensive ETOH abuse  and progressive renal failure with metabolic acidosis  INFECTIOUS DISEASE LEFT KNEE SEPTIC ARTHITIS -continue antibiotics as prescribed -follow up cultures -follow up ID consultation CULTURES NEG   SEVERE ALCOHOL  WITHDRAWAL -Therapy with Thiamine and MVI -CIWA Protocol -  Precedex as needed -High risk for intubation  -high risk for aspiration     Septic shock/severe sepsis -use vasopressors to keep MAP>65 -follow ABG and LA -follow up cultures -emperic ABX -consider stress dose steroids -aggressive IV fluid Resuscitation     ACUTE KIDNEY INJURY/Renal Failure -follow chem 7 -follow UO -continue Foley Catheter-assess need -Avoid nephrotoxic agents -Recheck creatinine  Needs HD today   CARDIAC ICU monitoring  ID -continue IV abx as prescibed -follow up cultures  GI GI PROPHYLAXIS as indicated  NUTRITIONAL STATUS DIET-->on hold Constipation protocol as indicated  ENDO - will use ICU hypoglycemic\Hyperglycemia protocol if indicated   ELECTROLYTES -follow labs as needed -replace as needed -pharmacy consultation and following   DVT/GI PRX ordered TRANSFUSIONS AS NEEDED MONITOR FSBS ASSESS the need for LABS as needed   Critical Care Time devoted to patient care services described in this note is 32 minutes.   Overall, patient is critically ill, prognosis is guarded.    Corrin Parker, M.D.  [facility]  Medical Director [place] Director [facility] Cardio-Pulmonary Department

## 2018-09-22 NOTE — Progress Notes (Signed)
[facility]  ROUNDING NOTE   Subjective:    06/04 0701 - 06/05 0700 In: 3027.2 [I.V.:2276.4; IV Piggyback:750.8] Out: 450 [Urine:250; Stool:200]  S Creatinine significantly higher at 3.42-> 6->7.7->9.2 UOP 250 cc Patient remains delirious this morning. Speech slurred  Hypotensive  Off pressors. Getting maintenance iv bicarb Answering a few Qs Denies pain  Objective:  Vital signs in last 24 hours:  Temp:  [98.2 F (36.8 C)-100.3 F (37.9 C)] 98.2 F (36.8 C) (06/05 0200) Pulse Rate:  [108-116] 110 (06/05 0800) Resp:  [19-31] 25 (06/05 0800) BP: (110-152)/(70-115) 144/82 (06/05 0800) SpO2:  [93 %-100 %] 99 % (06/05 0800)  Weight change:  Filed Weights   09/12/18 0935  Weight: (!) 145.2 kg    Intake/Output: I/O last 3 completed shifts: In: 4693.2 [I.V.:3606.9; IV Piggyback:1086.3] Out: 550 [Urine:350; Stool:200]   Intake/Output this shift:  Total I/O In: 143.3 [I.V.:128.1; IV Piggyback:15.2] Out: 240 [Urine:40; Stool:200]  Physical Exam: General: NAD,critically  ill appearing  Head:  Dry oral mucosal membranes  Eyes: Anicteric,    Lungs:  Coarse breath sounds, room air  Heart: tachycardic  Abdomen:  Soft, nontender, obese, distended, decreased BS  Extremities:  ++ peripheral edema., b.l knee dressing  Neurologic: Alert;  Oriented to self, encephalopathic  Skin: warm    Foley in place    Basic Metabolic Panel: Recent Labs  Lab 09/17/18 0543 09/18/18 0517 09/19/18 0439 09/20/18 0500 09/21/18 0120 09/22/18 0411  NA 135 136 134* 137 139 140  K 3.4* 3.7 3.8 3.9 4.3 3.5  CL 98 102 101 107 111 108  CO2 22 21* 22 19* 17* 16*  GLUCOSE 201* 186* 167* 174* 192* 303*  BUN 19 20 22* 30* 37* 58*  CREATININE 2.46* 2.60* 3.42* 5.98* 7.73* 9.24*  CALCIUM 9.1 8.8* 8.2* 8.2* 8.3* 8.1*  MG 1.5* 1.7 1.9 2.0 2.4  --   PHOS 4.5  --   --   --   --   --     Liver Function Tests: Recent Labs  Lab 09/16/18 0459 09/19/18 0439 09/20/18 0505  09/22/18 0411  AST 143* 70* 64* 44*  ALT 59* 43 35 27  ALKPHOS 75 71 67 62  BILITOT 0.8 0.6 0.7 0.8  PROT 6.2* 6.0* 6.1* 6.1*  ALBUMIN 2.1* 1.9* 1.8* 1.8*   No results for input(s): LIPASE, AMYLASE in the last 168 hours. Recent Labs  Lab 09/20/18 0948  AMMONIA 12    CBC: Recent Labs  Lab 09/16/18 0459 09/17/18 0543 09/19/18 0439 09/20/18 0500  09/21/18 0120 09/21/18 1319 09/21/18 1905 09/22/18 0020 09/22/18 0411 09/22/18 0700  WBC 6.8 7.9 11.2* 13.5*  --  11.1*  --   --   --  10.3  --   NEUTROABS 4.3  --  6.4  --   --  8.0*  --   --   --  8.5*  --   HGB 9.6* 9.5* 9.0* 8.6*   < > 8.7* 8.2* 7.8* 7.8* 7.8* 7.8*  HCT 29.1* 29.3* 28.4* 28.4*   < > 28.0* 26.5* 24.8* 24.7* 24.7* 24.5*  MCV 81.3 81.8 85.3 85.8  --  86.2  --   --   --  84.3  --   PLT 193 227 269 354  --  276  --   --   --  124*  --    < > = values in this interval not displayed.    Cardiac Enzymes: Recent Labs  Lab 09/19/18 1312 09/20/18 1610  CKTOTAL 716* 992*    BNP: Invalid input(s): POCBNP  CBG: Recent Labs  Lab 09/21/18 1121 09/21/18 1601 09/21/18 2205 09/22/18 0742 09/22/18 0835  GLUCAP 206* 213* 256* 317* 331*    Microbiology: Results for orders placed or performed during the hospital encounter of 09/12/18  Blood Culture (routine x 2)     Status: None   Collection Time: 09/12/18  9:56 AM  Result Value Ref Range Status   Specimen Description BLOOD LEFT HAND  Final   Special Requests   Final    BOTTLES DRAWN AEROBIC AND ANAEROBIC Blood Culture adequate volume   Culture   Final    NO GROWTH 5 DAYS Performed at [facility], [address], [place], [place] [address]    Report Status 09/17/2018 FINAL  Final  Urine culture     Status: None   Collection Time: 09/12/18 10:01 AM  Result Value Ref Range Status   Specimen Description   Final    URINE, RANDOM Performed at [facility], [address] [place], [place] [address]    Special Requests   Final     NONE Performed at [facility], [address] [place], [place] [address]    Culture   Final    NO GROWTH Performed at [facility], [place] [address], [place], [place] [address]    Report Status 09/13/2018 FINAL  Final  SARS Coronavirus 2 (CEPHEID- Performed in [place] hospital lab), Hosp Order     Status: None   Collection Time: 09/12/18 10:02 AM  Result Value Ref Range Status   SARS Coronavirus 2 NEGATIVE NEGATIVE Final    Comment: (NOTE) If result is NEGATIVE SARS-CoV-2 target nucleic acids are NOT DETECTED. The SARS-CoV-2 RNA is generally detectable in upper and lower  respiratory specimens during the acute phase of infection. The lowest  concentration of SARS-CoV-2 viral copies this assay can detect is 250  copies / mL. A negative result does not preclude SARS-CoV-2 infection  and should not be used as the sole basis for treatment or other  patient management decisions.  A negative result may occur with  improper specimen collection / handling, submission of specimen other  than nasopharyngeal swab, presence of viral mutation(s) within the  areas targeted by this assay, and inadequate number of viral copies  (<250 copies / mL). A negative result must be combined with clinical  observations, patient history, and epidemiological information. If result is POSITIVE SARS-CoV-2 target nucleic acids are DETECTED. The SARS-CoV-2 RNA is generally detectable in upper and lower  respiratory specimens dur ing the acute phase of infection.  Positive  results are indicative of active infection with SARS-CoV-2.  Clinical  correlation with patient history and other diagnostic information is  necessary to determine patient infection status.  Positive results do  not rule out bacterial infection or co-infection with other viruses. If result is PRESUMPTIVE POSTIVE SARS-CoV-2 nucleic acids MAY BE PRESENT.   A presumptive positive result was obtained on the submitted specimen   and confirmed on repeat testing.  While 2019 novel coronavirus  (SARS-CoV-2) nucleic acids may be present in the submitted sample  additional confirmatory testing may be necessary for epidemiological  and / or clinical management purposes  to differentiate between  SARS-CoV-2 and other Sarbecovirus currently known to infect humans.  If clinically indicated additional testing with an alternate test  methodology [phone]) is advised. The SARS-CoV-2 RNA is generally  detectable in upper and lower respiratory sp ecimens  during the acute  phase of infection. The expected result is Negative. Fact Sheet for Patients:  StrictlyIdeas.no Fact Sheet for Healthcare Providers: BankingDealers.co.za This test is not yet approved or cleared by the [place] FDA and has been authorized for detection and/or diagnosis of SARS-CoV-2 by FDA under an Emergency Use Authorization (EUA).  This EUA will remain in effect (meaning this test can be used) for the duration of the COVID-19 declaration under Section 564(b)(1) of the Act, 21 U.S.C. section 360bbb-3(b)(1), unless the authorization is terminated or revoked sooner. Performed at [facility], [address] [place], [place] [address]   Blood Culture (routine x 2)     Status: None   Collection Time: 09/12/18 10:06 AM  Result Value Ref Range Status   Specimen Description BLOOD LEFT ANTECUBITAL  Final   Special Requests   Final    BOTTLES DRAWN AEROBIC AND ANAEROBIC Blood Culture adequate volume   Culture   Final    NO GROWTH 5 DAYS Performed at [facility] [address] [place], [place] [address]    Report Status 09/17/2018 FINAL  Final  MRSA PCR Screening     Status: None   Collection Time: 09/12/18  7:04 PM  Result Value Ref Range Status   MRSA by PCR NEGATIVE NEGATIVE Final    Comment:        The GeneXpert MRSA Assay (FDA approved for NASAL specimens only), is one  component of a comprehensive MRSA colonization surveillance program. It is not intended to diagnose MRSA infection nor to guide or monitor treatment for MRSA infections. Performed at [facility], [address] [place], [place] [address]   C difficile quick scan w PCR reflex     Status: None   Collection Time: 09/14/18  2:21 PM  Result Value Ref Range Status   C Diff antigen NEGATIVE NEGATIVE Final   C Diff toxin NEGATIVE NEGATIVE Final   C Diff interpretation No C. difficile detected.  Final    Comment: Performed at [facility], [address] [place], [place] [address]  Group A Strep by PCR     Status: None   Collection Time: 09/15/18  1:53 PM  Result Value Ref Range Status   Group A Strep by PCR NOT DETECTED NOT DETECTED Final    Comment: Performed at [facility], [address] [place], [place] [address]  Body fluid culture     Status: None   Collection Time: 09/15/18  5:13 PM  Result Value Ref Range Status   Specimen Description   Final    KNEE Performed at [facility], [address] [place], [place] [address]    Special Requests   Final    NONE Performed at [facility], [address], [place], [place] [address]    Gram Stain   Final    RARE RBCS FEW WBC SEEN NO ORGANISMS SEEN Performed at [facility], [address] [place], [place] [address]    Culture   Final    NO GROWTH 3 DAYS Performed at [facility], [place] [address] [place], [place] [address]    Report Status 09/19/2018 FINAL  Final  Body fluid culture     Status: None   Collection Time: 09/16/18  8:55 AM  Result Value Ref Range Status   Specimen Description KNEE RIGHT  Final   Special Requests NONE  Final   Gram Stain   Final    RARE  WBC PRESENT, PREDOMINANTLY PMN NO ORGANISMS SEEN    Culture   Final    NO GROWTH 3 DAYS Performed at [facility] [address] [place], [place] [address]    Report Status  09/20/2018 FINAL  Final  Aerobic/Anaerobic Culture (surgical/deep wound)     Status: None (Preliminary result)   Collection Time: 09/17/18 11:32 AM  Result Value Ref Range Status   Specimen Description   Final    TISSUE LEFT KNEE 1 Performed at [facility], [address] [place], [place] [address]    Special Requests PATIENT ON FOLLOWING MAXIPIME VANCOMYCIN  Final   Gram Stain   Final    FEW WBC PRESENT,BOTH PMN AND MONONUCLEAR NO ORGANISMS SEEN    Culture   Final    NO GROWTH 4 DAYS NO ANAEROBES ISOLATED; CULTURE IN PROGRESS FOR 5 DAYS Performed at [facility], [place] [address], [place], [place] [address]    Report Status PENDING  Incomplete  Acid Fast Smear (AFB)     Status: None   Collection Time: 09/17/18 11:32 AM  Result Value Ref Range Status   AFB Specimen Processing Comment  Final    Comment: Tissue Grinding and Digestion/Decontamination   Acid Fast Smear Negative  Final    Comment: (NOTE) Performed At: [facility] [place], [place] [id number] Rush Farmer MD [id number]    Source (AFB) TISSUE  Final    Comment: LEFT KNEE Performed at [facility], [place] [address] [place], [place] [address]   Culture, fungus without smear     Status: None (Preliminary result)   Collection Time: 09/17/18 11:34 AM  Result Value Ref Range Status   Specimen Description   Final    SYNOVIAL Performed at [facility], [address], [place], [place] [address]    Special Requests   Final    NONE Performed at [facility], [address] [place], [place] [address]    Culture   Final    NO FUNGUS ISOLATED AFTER 1 DAY Performed at [facility], [place] [address], [place], [place] [address]    Report Status PENDING  Incomplete  Acid Fast Culture with reflexed sensitivities     Status: None   Collection Time: 09/17/18 11:36 AM  Result Value Ref Range Status   Acid Fast Culture CANC  Final    Comment:  (NOTE) Test cancelled at client's request.      Cancelled per Levin Erp 09/21/2018 Performed At: [facility] [place], [place] [id number] Rush Farmer MD QH:[id number]    Source of Sample TISSUE  Final    Comment: RIGHT KNEE Performed at [facility], [place] [address] [place], [place] [address]   Aerobic/Anaerobic Culture (surgical/deep wound)     Status: None (Preliminary result)   Collection Time: 09/17/18 11:43 AM  Result Value Ref Range Status   Specimen Description   Final    TISSUE LEFT KNEE 2 Performed at [facility], [address], [place],  [address]    Special Requests PATIENT ON FOLLOWING MAXIPIME VANCOMYCIN  Final   Gram Stain   Final    FEW WBC PRESENT, PREDOMINANTLY PMN NO ORGANISMS SEEN    Culture   Final    NO GROWTH 4 DAYS NO ANAEROBES ISOLATED; CULTURE IN PROGRESS FOR 5 DAYS Performed at [facility], [place] [address] [place],  [address]    Report Status PENDING  Incomplete  Aerobic/Anaerobic Culture (surgical/deep wound)     Status: None (Preliminary result)   Collection Time: 09/17/18 11:44 AM  Result Value Ref Range Status   Specimen Description   Final    TISSUE LEFT KNEE 3 Performed at [facility], [address] [place], [place] [address]    Special Requests PATIENT ON FOLLOWING MAXIPIME VANCOMYCIN  Final   Gram Stain   Final    MODERATE WBC PRESENT, PREDOMINANTLY PMN NO ORGANISMS SEEN    Culture   Final    NO GROWTH 4 DAYS NO ANAEROBES ISOLATED; CULTURE IN PROGRESS FOR 5 DAYS Performed at [facility], [address], [place], [place] [address]    Report Status PENDING  Incomplete  Aerobic/Anaerobic Culture (surgical/deep wound)     Status: None (Preliminary result)   Collection Time: 09/18/18  9:43 PM  Result Value Ref Range Status   Specimen Description   Final    SYNOVIAL Performed at [facility], [address] [place], [place] [address]     Special Requests   Final    NONE Performed at [facility], [address] [place], [place] [address]    Gram Stain   Final    RARE WBC PRESENT, PREDOMINANTLY MONONUCLEAR NO ORGANISMS SEEN    Culture   Final    NO GROWTH 2 DAYS NO ANAEROBES ISOLATED; CULTURE IN PROGRESS FOR 5 DAYS Performed at [facility], [place] [address] [place], [place] [address]    Report Status PENDING  Incomplete  Aerobic/Anaerobic Culture (surgical/deep wound)     Status: None (Preliminary result)   Collection Time: 09/18/18  9:44 PM  Result Value Ref Range Status   Specimen Description   Final    SYNOVIAL Performed at [facility], [address], [place], [place] [address]    Special Requests   Final    RIGHT KNEE Performed at [facility], [address] [place], [place] [address]    Gram Stain   Final    FEW WBC PRESENT,BOTH PMN AND MONONUCLEAR NO ORGANISMS SEEN    Culture   Final    NO GROWTH 2 DAYS NO ANAEROBES ISOLATED; CULTURE IN PROGRESS FOR 5 DAYS Performed at [facility], [place] [address], [place], [place] [address]    Report Status PENDING  Incomplete  Aerobic/Anaerobic Culture (surgical/deep wound)     Status: None (Preliminary result)   Collection Time: 09/18/18  9:45 PM  Result Value Ref Range Status   Specimen Description   Final    SYNOVIAL Performed at [facility], [address], [place], [place] [address]    Special Requests   Final    RIGHT KNEE Performed at [facility], [address] [place],  [address]    Gram Stain   Final    FEW WBC PRESENT, PREDOMINANTLY MONONUCLEAR NO ORGANISMS SEEN    Culture   Final    NO GROWTH 2 DAYS NO ANAEROBES ISOLATED; CULTURE IN PROGRESS FOR 5 DAYS Performed at [facility], [place] [address] [place],  [address]    Report Status PENDING  Incomplete  Culture, blood (Routine X 2) w Reflex to ID Panel     Status: None (Preliminary result)   Collection Time:  09/19/18  1:11 PM  Result Value Ref Range Status   Specimen Description BLOOD LEFT ANTECUBITAL  Final   Special Requests   Final    BOTTLES DRAWN AEROBIC AND ANAEROBIC  Blood Culture results may not be optimal due to an excessive volume of blood received in culture bottles   Culture   Final    NO GROWTH 3 DAYS Performed at [facility], [address] [place], [place] [address]    Report Status PENDING  Incomplete  Culture, blood (Routine X 2) w Reflex to ID Panel     Status: None (Preliminary result)   Collection Time: 09/19/18  1:20 PM  Result Value Ref Range Status   Specimen Description BLOOD BLOOD RIGHT HAND  Final   Special Requests   Final    BOTTLES DRAWN AEROBIC AND ANAEROBIC Blood Culture results may not be optimal due to an excessive volume of blood received in culture bottles   Culture   Final    NO GROWTH 3 DAYS Performed at [facility], [address] [place], [place] [address]    Report Status PENDING  Incomplete  Urine Culture     Status: None   Collection Time: 09/20/18  3:59 PM  Result Value Ref Range Status   Specimen Description   Final    URINE, RANDOM Performed at [facility], [address] [place], [place] [address]    Special Requests   Final    NONE Performed at [facility], [address] [place], [place] [address]    Culture   Final    NO GROWTH Performed at [facility], [place] [address], [place], [place] [address]    Report Status 09/21/2018 FINAL  Final  C difficile quick scan w PCR reflex     Status: None   Collection Time: 09/21/18  3:27 PM  Result Value Ref Range Status   C Diff antigen NEGATIVE NEGATIVE Final   C Diff toxin NEGATIVE NEGATIVE Final   C Diff interpretation No C. difficile detected.  Final    Comment: Performed at [facility], [address] [place], [place] [address]  Gastrointestinal Panel by PCR , Stool     Status: None   Collection Time: 09/21/18  3:27 PM   Result Value Ref Range Status   Campylobacter species NOT DETECTED NOT DETECTED Final   Plesimonas shigelloides NOT DETECTED NOT DETECTED Final   Salmonella species NOT DETECTED NOT DETECTED Final   Yersinia enterocolitica NOT DETECTED NOT DETECTED Final   Vibrio species NOT DETECTED NOT DETECTED Final   Vibrio cholerae NOT DETECTED NOT DETECTED Final   Enteroaggregative E coli (EAEC) NOT DETECTED NOT DETECTED Final   Enteropathogenic E coli (EPEC) NOT DETECTED NOT DETECTED Final   Enterotoxigenic E coli (ETEC) NOT DETECTED NOT DETECTED Final   Shiga like toxin producing E coli (STEC) NOT DETECTED NOT DETECTED Final   Shigella/Enteroinvasive E coli (EIEC) NOT DETECTED NOT DETECTED Final   Cryptosporidium NOT DETECTED NOT DETECTED Final   Cyclospora cayetanensis NOT DETECTED NOT DETECTED Final   Entamoeba histolytica NOT DETECTED NOT DETECTED Final   Giardia lamblia NOT DETECTED NOT DETECTED Final   Adenovirus F40/41 NOT DETECTED NOT DETECTED Final   Astrovirus NOT DETECTED NOT DETECTED Final   Norovirus GI/GII NOT DETECTED NOT DETECTED Final   Rotavirus A NOT DETECTED NOT DETECTED Final   Sapovirus (I, II, IV, and V) NOT DETECTED NOT DETECTED Final    Comment: Performed at [facility], [address] [place], [place] [address]    Coagulation Studies: No results for input(s): LABPROT, INR in the last 72 hours.  Urinalysis: Recent Labs    09/19/18 1234  09/20/18 1559  COLORURINE AMBER* AMBER*  LABSPEC 1.018 1.016  PHURINE 5.0 5.0  GLUCOSEU NEGATIVE NEGATIVE  HGBUR LARGE* LARGE*  BILIRUBINUR NEGATIVE NEGATIVE  KETONESUR 5* NEGATIVE  PROTEINUR 100* 100*  NITRITE NEGATIVE NEGATIVE  LEUKOCYTESUR NEGATIVE TRACE*      Imaging: US Venous Img Lower Bilateral  Result Date: 09/20/2018 CLINICAL DATA:  Bilateral lower extremity edema. History of knee surgery. Evaluate for DVT. EXAM: BILATERAL LOWER EXTREMITY VENOUS DOPPLER ULTRASOUND TECHNIQUE: Gray-scale sonography with  graded compression, as well as color Doppler and duplex ultrasound were performed to evaluate the lower extremity deep venous systems from the level of the common femoral vein and including the common femoral, femoral, profunda femoral, popliteal and calf veins including the posterior tibial, peroneal and gastrocnemius veins when visible. The superficial great saphenous vein was also interrogated. Spectral Doppler was utilized to evaluate flow at rest and with distal augmentation maneuvers in the common femoral, femoral and popliteal veins. COMPARISON:  None. FINDINGS: Examination is degraded due to patient body habitus and poor sonographic window. RIGHT LOWER EXTREMITY Common Femoral Vein: No evidence of thrombus. Normal compressibility, respiratory phasicity and response to augmentation. Saphenofemoral Junction: No evidence of thrombus. Normal compressibility and flow on color Doppler imaging. Profunda Femoral Vein: No evidence of thrombus. Normal compressibility and flow on color Doppler imaging. Femoral Vein: No evidence of thrombus. Normal compressibility, respiratory phasicity and response to augmentation. Popliteal Vein: No evidence of thrombus. Normal compressibility, respiratory phasicity and response to augmentation. Calf Veins: Appear patent where imaged. Superficial Great Saphenous Vein: No evidence of thrombus. Normal compressibility. Other Findings: Note is made of a right inguinal lymph node which is not enlarged by size criteria measuring 1 cm greatest short axis diameter and maintains a benign fatty hila. LEFT LOWER EXTREMITY Common Femoral Vein: No evidence of thrombus. Normal compressibility, respiratory phasicity and response to augmentation. Saphenofemoral Junction: No evidence of thrombus. Normal compressibility and flow on color Doppler imaging. Profunda Femoral Vein: No evidence of thrombus. Normal compressibility and flow on color Doppler imaging. Femoral Vein: No evidence of thrombus.  Normal compressibility, respiratory phasicity and response to augmentation. Popliteal Vein: There is hypoechoic near occlusive thrombus within the distal aspect the left popliteal vein (images 25 through 28). Calf Veins: Appear patent where imaged. Superficial Great Saphenous Vein: No evidence of thrombus. Normal compressibility. Other Findings: Note is made of an approximately 5.2 x 3.2 x 1.5 cm mixed echogenic serpiginous fluid collection with left popliteal fossa compatible with a Baker cyst. IMPRESSION: 1. The examination is positive for near occlusive DVT involving the distal aspect of the left popliteal vein. 2. No evidence of DVT within the right lower extremity. 3. Note made of an approximately 5.2 cm complex left-sided Baker's cyst. Electronically Signed   By: Sandi Mariscal M.D.   On: 09/20/2018 17:31   Dg Chest Port 1 View  Result Date: 09/21/2018 CLINICAL DATA:  Central line placement EXAM: PORTABLE CHEST 1 VIEW COMPARISON:  09/21/2018 FINDINGS: Interval placement of a left neck multi lumen vascular catheter, tip projecting in the vicinity of the brachiocephalic confluence. Otherwise unchanged low volume AP portable examination with cardiomegaly. No acute appearing airspace disease. IMPRESSION: Interval placement of a left neck multi lumen vascular catheter, tip projecting in the vicinity of the brachiocephalic confluence. Otherwise unchanged low volume AP portable examination with cardiomegaly. No acute appearing airspace disease. Electronically Signed   By: Eddie Candle M.D.   On: 09/21/2018 11:38   Dg Chest Port 1 View  Result Date: 09/21/2018  CLINICAL DATA:  Acute respiratory failure EXAM: PORTABLE CHEST 1 VIEW COMPARISON:  09/20/2018 FINDINGS: Cardiac shadow is stable. Lungs are well aerated bilaterally. No focal infiltrate or sizable effusion is seen. No bony abnormality is noted. IMPRESSION: No acute abnormality noted. Electronically Signed   By: Inez Catalina M.D.   On: 09/21/2018 07:46   Dg  Chest Port 1 View  Result Date: 09/20/2018 CLINICAL DATA:  Fevers EXAM: PORTABLE CHEST 1 VIEW COMPARISON:  09/14/2018 FINDINGS: Cardiac shadow is stable. Previously seen right jugular central line is been removed. The lungs demonstrate mild bibasilar atelectasis right greater than left. No bony abnormality is seen. IMPRESSION: Mild bibasilar atelectasis. Electronically Signed   By: Inez Catalina M.D.   On: 09/20/2018 11:12   US Abdomen Limited Ruq  Result Date: 09/20/2018 CLINICAL DATA:  Liver cirrhosis.  Alcohol withdrawal. EXAM: ULTRASOUND ABDOMEN LIMITED RIGHT UPPER QUADRANT COMPARISON:  CT AP 04/30/2015 FINDINGS: Gallbladder: Gallbladder sludge noted. No gallstones, wall thickening or sonographic Murphy's sign. Common bile duct: Diameter: 5 mm Liver: The liver is diffusely echogenic compatible with hepatic steatosis. No focal liver lesion. Portal vein is patent on color Doppler imaging with normal direction of blood flow towards the liver. IMPRESSION: 1. Echogenic liver compatible with hepatic steatosis. 2. Gallbladder sludge. Electronically Signed   By: Kerby Moors M.D.   On: 09/20/2018 12:15     Medications:   . sodium chloride 250 mL (09/22/18 0827)  . anidulafungin Stopped (09/21/18 1501)  . dexmedetomidine (PRECEDEX) IV infusion Stopped (09/20/18 2153)  . doxycycline (VIBRAMYCIN) IV Stopped (09/22/18 0012)  . heparin 2,550 Units/hr (09/22/18 0826)  . insulin 2.7 Units/hr (09/22/18 0843)  . norepinephrine (LEVOPHED) Adult infusion Stopped (09/20/18 1953)  . piperacillin-tazobactam (ZOSYN)  IV Stopped (09/22/18 8250)  .  sodium bicarbonate (isotonic) infusion in sterile water 50 mL/hr at 09/22/18 0826   . aspirin EC  81 mg Oral Daily  . budesonide (PULMICORT) nebulizer solution  0.5 mg Nebulization BID  . Chlorhexidine Gluconate Cloth  6 each Topical Q0600  . fentaNYL (SUBLIMAZE) injection  12.5 mcg Intravenous N3Z  . folic acid  1 mg Oral Daily  . hydrocortisone sod succinate  (SOLU-CORTEF) inj  50 mg Intravenous Q6H  . ipratropium-albuterol  3 mL Nebulization Q4H  . mouth rinse  15 mL Mouth Rinse BID  . multivitamin with minerals  1 tablet Oral Daily  . thiamine  100 mg Oral Daily  . tranexamic acid (CYKLOKAPRON) topical -INTRAOP  2,000 mg Topical Once   sodium chloride, acetaminophen **OR** acetaminophen, albuterol, fentaNYL (SUBLIMAZE) injection, heparin, HYDROcodone-acetaminophen, iohexol, LORazepam, menthol-cetylpyridinium **OR** phenol, menthol-cetylpyridinium **OR** phenol, metoCLOPramide **OR** metoCLOPramide (REGLAN) injection, ondansetron **OR** ondansetron (ZOFRAN) IV  Assessment/ Plan:  [name] is a 58 y.o. black male with hyeprtension, COPD, diabetes mellitus type II who underwent bilateral knee replacement surgeries last week. who was admitted to [facility] on 09/12/2018 for sepsis  1. Acute renal failure with metabolic acidosis and volume overload baseline creatinine of 1.07 with normal GFR in 06/12/18.  Likely severe ATN from hypotension and sepsis -  Hold nephrotoxic meds - patient remains delirious, oliguric. Creatinine critically elevated, acidotic -  Dialysis today  2. Septic shock/hypotension Requiring vasopressors -Recent bilateral knee replacement on Sep 01, 2018 at [facility]  -Suspected  knee septic arthritis, patient underwent I&D and polyethylene exchange on 5/31 and Rt knee on 6/1 -Currently being treated with anidulafungin, Doxy and Zosyn - dose adjustment as per pharmacist       LOS: 10 Garen Woolbright  Candiss Norse 6/5/20209:19 AM

## 2018-09-22 NOTE — Progress Notes (Signed)
Pre HD Assessment  Wife at bedside pre-tx. Insulin drip w/ Qhr checks. Heparin drip for DVT. Evident tremor and and confusion, encephalopathy. BP stable, HR sinus tachy. Respirations irregular, coincide with tremor. Afebrile, sats 98-100 on room air.    09/22/18 1200  Neurological  Level of Consciousness Responds to Voice  Orientation Level Disoriented X4  Respiratory  Respiratory Pattern Irregular  Chest Assessment Chest expansion symmetrical  Bilateral Breath Sounds Diminished  Cardiac  Pulse Regular  Heart Sounds S1, S2  ECG Monitor Yes  Cardiac Rhythm ST  Vascular  R Radial Pulse +2  L Radial Pulse +2  Integumentary  Integumentary (WDL) X  Skin Color Appropriate for ethnicity  Skin Condition Dry  Skin Integrity Surgical Incision (see LDA)  Additional Integumentary Comments  (HD pt / first tx)  Musculoskeletal  Musculoskeletal (WDL) X  Generalized Weakness Yes  Gastrointestinal  Bowel Sounds Assessment Active  GU Assessment  Genitourinary (WDL) X  Genitourinary Symptoms Urinary Catheter (HD pt)  Urine Characteristics  Urine Color Yellow/straw  Urine Appearance Clear  Urethral Catheter Elizebeth Koller, RN Latex 16 Fr.  Placement Date/Time: 09/20/18 1730   Perineal care performed prior to insertion?: Yes  Person Inserting Catheter: Elizebeth Koller, RN  Person Assisting with Catheter Insertion: [place], NT  Patient Location at Time of Insertion: ICU 20  Catheter Type: Late...  Output (mL) 30 mL  Psychosocial  Psychosocial (WDL) X  Patient Behaviors Calm;Not interactive  Needs Expressed Denies  Emotional support given Given to patient

## 2018-09-22 NOTE — TOC Progression Note (Signed)
Transition of Care [facility]) - Progression Note    Patient Details  Name: Isaac Cross MRN: [id number] Date of Birth: December 27, 1960  [facility] CM/SW Contact  Isaac Hutching, RN Phone Number: 09/22/2018, 1:37 PM  Clinical Narrative:    Patient remains in the ICU stepdown status.  Patient is encephalopathic on CIWA for ETOH abuse. Patient will be started on HD today for acute renal failure.  Patient's wife has been at the bedside.  RNCM continues to monitor patient progress, unsure of discharge disposition at this time.     Expected Discharge Plan: [place] Barriers to Discharge: Continued Medical Work up  Expected Discharge Plan and Services Expected Discharge Plan: [place]   Discharge Planning Services: CM Consult Post Acute Care Choice: [place] arrangements for the past 2 months: Single Family Home(Condo)                             [facility] Date [place]: 09/18/18 Time [place]: [place] Representative spoke with at [place]: [name]- unsure of when patient will discharge.   Social Determinants of Health (SDOH) Interventions    Readmission Risk Interventions No flowsheet data found.

## 2018-09-22 NOTE — Progress Notes (Signed)
ID   Date of Admission:  09/12/2018     Subjective: Obtunded Just finished 1st  Dialysis session  Fever down in the past 48 hrs TMAx of 100 since starting hydrocortisone and also anidulafungin, doxy and heparin Medications:  . aspirin EC  81 mg Oral Daily  . budesonide (PULMICORT) nebulizer solution  0.5 mg Nebulization BID  . Chlorhexidine Gluconate Cloth  6 each Topical Q0600  . [START ON 09/23/2018] feeding supplement (NEPRO CARB STEADY)  237 mL Oral BID BM  . fentaNYL (SUBLIMAZE) injection  12.5 mcg Intravenous V4Q  . folic acid  1 mg Intravenous Daily  . hydrocortisone sod succinate (SOLU-CORTEF) inj  50 mg Intravenous Q6H  . ipratropium-albuterol  3 mL Nebulization Q4H  . mouth rinse  15 mL Mouth Rinse BID  . multivitamin  1 tablet Oral QHS  . multivitamin with minerals  1 tablet Oral Daily  . thiamine injection  100 mg Intravenous Daily  . tranexamic acid (CYKLOKAPRON) topical -INTRAOP  2,000 mg Topical Once    Objective: Vital signs in last 24 hours: Temp:  [98.2 F (36.8 C)-99.3 F (37.4 C)] 99.3 F (37.4 C) (06/05 1600) Pulse Rate:  [108-116] 110 (06/05 1600) Resp:  [18-31] 25 (06/05 1600) BP: (121-145)/(70-115) 132/80 (06/05 1600) SpO2:  [93 %-100 %] 94 % (06/05 1600) Weight:  [151.6 kg] 151.6 kg (06/05 1300)  PHYSICAL EXAM:  General: obtunded- shaky at times  Head: Normocephalic, without obvious abnormality, atraumatic. Eyes: Conjunctivae clear, anicteric sclerae. Pupils are equal Lungs: b/l air entry Heart: s1s2 Abdomen: Soft, non-tender,not distended. Bowel sounds normal. No masses Extremities:edematous- knee surgical site covered with dressing Skin: No rashes or lesions. Or bruising Lymph: Cervical, supraclavicular normal. Neurologic: cannot be examined Lab Results Recent Labs    09/21/18 0120  09/22/18 0411 09/22/18 0700  WBC 11.1*  --  10.3  --   HGB 8.7*   < > 7.8* 7.8*  HCT 28.0*   < > 24.7* 24.5*  NA 139  --  140  --   K 4.3  --  3.5  --    CL 111  --  108  --   CO2 17*  --  16*  --   BUN 37*  --  58*  --   CREATININE 7.73*  --  9.24*  --    < > = values in this interval not displayed.   Liver Panel Recent Labs    09/20/18 0505 09/22/18 0411  PROT 6.1* 6.1*  ALBUMIN 1.8* 1.8*  AST 64* 44*  ALT 35 27  ALKPHOS 67 62  BILITOT 0.7 0.8  BILIDIR 0.2  --   IBILI 0.5  --    Sedimentation Rate Recent Labs    09/20/18 0500  ESRSEDRATE 106*   C-Reactive Protein No results for input(s): CRP in the last 72 hours.  Microbiology:  Studies/Results: US Venous Img Lower Bilateral  Result Date: 09/20/2018 CLINICAL DATA:  Bilateral lower extremity edema. History of knee surgery. Evaluate for DVT. EXAM: BILATERAL LOWER EXTREMITY VENOUS DOPPLER ULTRASOUND TECHNIQUE: Gray-scale sonography with graded compression, as well as color Doppler and duplex ultrasound were performed to evaluate the lower extremity deep venous systems from the level of the common femoral vein and including the common femoral, femoral, profunda femoral, popliteal and calf veins including the posterior tibial, peroneal and gastrocnemius veins when visible. The superficial great saphenous vein was also interrogated. Spectral Doppler was utilized to evaluate flow at rest and with distal augmentation maneuvers in the common  femoral, femoral and popliteal veins. COMPARISON:  None. FINDINGS: Examination is degraded due to patient body habitus and poor sonographic window. RIGHT LOWER EXTREMITY Common Femoral Vein: No evidence of thrombus. Normal compressibility, respiratory phasicity and response to augmentation. Saphenofemoral Junction: No evidence of thrombus. Normal compressibility and flow on color Doppler imaging. Profunda Femoral Vein: No evidence of thrombus. Normal compressibility and flow on color Doppler imaging. Femoral Vein: No evidence of thrombus. Normal compressibility, respiratory phasicity and response to augmentation. Popliteal Vein: No evidence of  thrombus. Normal compressibility, respiratory phasicity and response to augmentation. Calf Veins: Appear patent where imaged. Superficial Great Saphenous Vein: No evidence of thrombus. Normal compressibility. Other Findings: Note is made of a right inguinal lymph node which is not enlarged by size criteria measuring 1 cm greatest short axis diameter and maintains a benign fatty hila. LEFT LOWER EXTREMITY Common Femoral Vein: No evidence of thrombus. Normal compressibility, respiratory phasicity and response to augmentation. Saphenofemoral Junction: No evidence of thrombus. Normal compressibility and flow on color Doppler imaging. Profunda Femoral Vein: No evidence of thrombus. Normal compressibility and flow on color Doppler imaging. Femoral Vein: No evidence of thrombus. Normal compressibility, respiratory phasicity and response to augmentation. Popliteal Vein: There is hypoechoic near occlusive thrombus within the distal aspect the left popliteal vein (images 25 through 28). Calf Veins: Appear patent where imaged. Superficial Great Saphenous Vein: No evidence of thrombus. Normal compressibility. Other Findings: Note is made of an approximately 5.2 x 3.2 x 1.5 cm mixed echogenic serpiginous fluid collection with left popliteal fossa compatible with a Baker cyst. IMPRESSION: 1. The examination is positive for near occlusive DVT involving the distal aspect of the left popliteal vein. 2. No evidence of DVT within the right lower extremity. 3. Note made of an approximately 5.2 cm complex left-sided Baker's cyst. Electronically Signed   By: Sandi Mariscal M.D.   On: 09/20/2018 17:31   Dg Chest Port 1 View  Result Date: 09/21/2018 CLINICAL DATA:  Central line placement EXAM: PORTABLE CHEST 1 VIEW COMPARISON:  09/21/2018 FINDINGS: Interval placement of a left neck multi lumen vascular catheter, tip projecting in the vicinity of the brachiocephalic confluence. Otherwise unchanged low volume AP portable examination with  cardiomegaly. No acute appearing airspace disease. IMPRESSION: Interval placement of a left neck multi lumen vascular catheter, tip projecting in the vicinity of the brachiocephalic confluence. Otherwise unchanged low volume AP portable examination with cardiomegaly. No acute appearing airspace disease. Electronically Signed   By: Eddie Candle M.D.   On: 09/21/2018 11:38   Dg Chest Port 1 View  Result Date: 09/21/2018 CLINICAL DATA:  Acute respiratory failure EXAM: PORTABLE CHEST 1 VIEW COMPARISON:  09/20/2018 FINDINGS: Cardiac shadow is stable. Lungs are well aerated bilaterally. No focal infiltrate or sizable effusion is seen. No bony abnormality is noted. IMPRESSION: No acute abnormality noted. Electronically Signed   By: Inez Catalina M.D.   On: 09/21/2018 07:46     Assessment/Plan: 58 yr male had B/l knee replacements Sep 01, 2018 was admitted with fever, AKI . He was taking bactrim and keflex when he came to the Ed on 09/12/18  Initially fevers were persisting  inspite of IV vanco and cefepime and as there was no other source (Blood culture neg, urine culture neg, no pneumonia, ) the left knee was aspirated and it had 8000 wbc and he was taken for a wash out on 09/17/18. Rt knee was washed out on 09/18/18 All cultures neg Pts fever was down for 36-48 hrs and  started up again on 09/19/18 at 103. Central line, femoral line were removed Cefepime was changed to zosyn on 6/2  Also his creatinine which on admission was 3.71, then 4.3 was down to 2.46 on 5/31 and then started climbing up again and today is 5.98. Vanco was discontinued on 09/19/18 ( when it was 3.42)  Pts mental status has been worsening since the weekend after surgery/ anesthesia and it was found out yesterday that he consumes heavy alcohol- Dts were questioned and he was started on ativan on 09/19/18. Fever - PJI of the left knee was questioned and he had wash out and cultures neg No UTI, no bacteremia, no pneumonia ?? Candidemia   fungal blood culture neg so far added anidulafungin and doxy  Ultrasound legs showed Left popliteal vein DVT  Encephalopathy? DT? Drug reaction like NMS/serotonin syndrome Doubt he has any CNS infection as he was alert and appropriate until Saturday, worsened because of AKI  High cpk around 4500 initially  thought to be due to fall Declined to 717 and today increased to > 900. R/o NMS  Hypotension - septic shock VS other cause- after adding stress dose steroids BP normalized off pressor AKI- worsening started dialysis  Discussed the management with care team and with his wife who is at bedside ID will follow peripherally this weekend.

## 2018-09-22 NOTE — Progress Notes (Signed)
[place] for heparin drip management Indication: DVT  Allergies  Allergen Reactions  . Lipitor [Atorvastatin] Hives and Itching  . Other   . Viagra  [Sildenafil Citrate]     vision loss    Patient Measurements: Height: 6\' 4"  (193 cm) Weight: (!) 334 lb 3.5 oz (151.6 kg) IBW/kg (Calculated) : 86.8 Heparin Dosing Weight: 120 kg  Vital Signs: Temp: 98.5 F (36.9 C) (06/05 2000) Temp Source: Axillary (06/05 2000) BP: 142/85 (06/05 2300) Pulse Rate: 94 (06/05 2300)  Labs: Recent Labs    09/20/18 0948  09/20/18 1811  09/21/18 0120  09/22/18 0411 09/22/18 0700 09/22/18 1220 09/22/18 1640 09/22/18 2013 09/22/18 2306  HGB  --   --   --    < > 8.7*   < > 7.8* 7.8*  --   --  7.7*  --   HCT  --   --   --    < > 28.0*   < > 24.7* 24.5*  --   --  23.9*  --   PLT  --   --   --   --  276  --  124*  --   --   --  107*  --   APTT  --   --  35  --   --   --   --   --   --   --   --   --   HEPARINUNFRC  --    < > <0.10*  --  <0.10*   < > 0.28*  --  0.17*  --   --  0.41  CREATININE  --   --   --   --  7.73*  --  9.24*  --   --  8.11* 8.89*  --   CKTOTAL 992*  --   --   --   --   --   --   --   --   --   --   --    < > = values in this interval not displayed.    Estimated Creatinine Clearance: 14.1 mL/min (A) (by C-G formula based on SCr of 8.89 mg/dL (H)).   Medical History: Past Medical History:  Diagnosis Date  . Allergy   . Anxiety   . Arthritis    knees  . Asthma, mild intermittent, well-controlled   . Chronic obstructive asthma ([place])   . COPD (chronic obstructive pulmonary disease) ([place])   . Depression with anxiety   . Diabetes ([place])   . GERD (gastroesophageal reflux disease)   . HBP (high blood pressure)   . High cholesterol   . Renal cyst, right   . Sleep apnea    CPAP  . Swelling     Medications:  Scheduled:  . aspirin EC  81 mg Oral Daily  . budesonide (PULMICORT) nebulizer solution  0.5 mg Nebulization BID  .  Chlorhexidine Gluconate Cloth  6 each Topical Q0600  . [START ON 09/23/2018] feeding supplement (NEPRO CARB STEADY)  237 mL Oral BID BM  . fentaNYL (SUBLIMAZE) injection  12.5 mcg Intravenous F0Y  . folic acid  1 mg Intravenous Daily  . hydrocortisone sod succinate (SOLU-CORTEF) inj  50 mg Intravenous Q6H  . ipratropium-albuterol  3 mL Nebulization Q4H  . mouth rinse  15 mL Mouth Rinse BID  . multivitamin  1 tablet Oral QHS  . multivitamin with minerals  1 tablet Oral Daily  . thiamine injection  100 mg  Intravenous Daily  . tranexamic acid (CYKLOKAPRON) topical -INTRAOP  2,000 mg Topical Once   Infusions:  . sodium chloride Stopped (09/22/18 2221)  . anidulafungin Stopped (09/22/18 1745)  . doxycycline (VIBRAMYCIN) IV 125 mL/hr at 09/22/18 2300  . heparin 2,800 Units/hr (09/22/18 2300)  . insulin 19 mL/hr at 09/22/18 2300  . norepinephrine (LEVOPHED) Adult infusion Stopped (09/20/18 1953)  . piperacillin-tazobactam (ZOSYN)  IV Stopped (09/22/18 2218)  .  sodium bicarbonate (isotonic) infusion in sterile water 50 mL/hr at 09/22/18 2300    Assessment: Pharmacy consulted for heparin drip management for 58 yo male being treated with heparin for left popliteal vein DVT. Patient with bilateral knee replacement on 5/15. Patient admitted to [facility] on 5/26 with weakness and pain. Patient underwent I&D of left knee on 5/31 and right knee on 6/1. Patient prior to admission was on rivaroxaban 10mg . This was not continued in house and anti-Xa level was <0.1.   Goal of Therapy:  Heparin level 0.3-0.7 units/ml Monitor platelets by anticoagulation protocol: Yes   Plan:  Heparin level remains subtherapeutic. Confirmed with RN no interruptions in therapy. Will defer heparin bolus based on conversation with CCM and increase rate to 2800 units/hr. Will obtain follow-up anti-Xa level at 2330. Patient is to have serial hematocrit and hemoglobins as ordered by ICU team.   6/5:  HL @ 2300 = 0.41 Will continue  pt on current rate and draw confirmation level in 8 hrs on 6/6 @ 0700.   Pharmacy will continue to monitor and adjust per consult.   Isaac Cross D  09/22/2018,11:33 PM

## 2018-09-22 NOTE — Progress Notes (Signed)
Initial Nutrition Assessment  DOCUMENTATION CODES:   Obesity unspecified  INTERVENTION:   Nepro Shake po BID, each supplement provides 425 kcal and 19 grams protein  MVI daily   Rena-vite daily   Pt likely at high refeed risk; recommend monitor K, Mg and P labs daily once oral intake improves.   May need to consider NGT and nutrition support if oral intake does not improve.   NUTRITION DIAGNOSIS:   Inadequate oral intake related to acute illness as evidenced by other (comment)(per chart review ).  GOAL:   Patient will meet greater than or equal to 90% of their needs  MONITOR:   PO intake, Supplement acceptance, Labs, Weight trends, I & O's, Skin  REASON FOR ASSESSMENT:   LOS    ASSESSMENT:   58 y.o. black male with hyeprtension, COPD, diabetes mellitus type II who underwent bilateral knee replacement surgeries last week. who was admitted to [facility] on 09/12/2018 for sepsis and ARF  RD working remotely.  Pt with encephalopathy and confusion. Pt eating <50% of meals in hospital. HD initiated today. RD will add supplements and vitamins to help pt meet his estimated needs. If pt's oral intake does not improve with HD, recommend consider NGT placement and nutrition support as pt with increased estimated needs r/t obesity, large stature and HD. Pt likely at high refeed risk.     Per chart pt appears fairly weight stable pta.   Medications reviewed and include: aspirin, folic acid, MVI, thiamine, doxycycline, heparin, insulin, levophed, zosyn, Na bicarbonate    Labs reviewed: BUN 58(H), creat 9.24(H), Mg 2.4 wnl Hgb 7.8(L), Hct 24.5(L) cbgs- 351, 352, 311, 253, 261 x 24 hrs AIC 6.9(H)- 5/26  Unable to complete Nutrition-Focused physical exam at this time.   Diet Order:   Diet Order            Diet Carb Modified Fluid consistency: Thin; Room service appropriate? Yes  Diet effective now             EDUCATION NEEDS:   Not appropriate for education at this  time  Skin:  Skin Assessment: Reviewed RN Assessment(Incisions L and R knees)  Last BM:  6/5- TYPE 7  Height:   Ht Readings from Last 1 Encounters:  09/12/18 6\' 4"  (1.93 m)    Weight:   Wt Readings from Last 1 Encounters:  09/22/18 (!) 151.6 kg    Ideal Body Weight:  91.8 kg  BMI:  Body mass index is 40.68 kg/m.  Estimated Nutritional Needs:   Kcal:  2700-3000kcal/day   Protein:  >150g/day   Fluid:  >2.7L/day   Koleen Distance MS, RD, LDN Pager #- [phone] Office#- [phone] After Hours Pager: [phone]

## 2018-09-22 NOTE — Progress Notes (Signed)
Pre HD Tx    09/22/18 1300  Hand-Off documentation  Report given to (Full Name) Trellis Paganini RN  Report received from (Full Name) Harriett Sine RN  Vital Signs  Temp 98.3 F (36.8 C)  Temp Source Axillary  Pulse Rate (!) 116  Pulse Rate Source Monitor  Resp (!) 28  BP 131/71  BP Location Left Arm  BP Method Automatic  Patient Position (if appropriate) Lying  Oxygen Therapy  SpO2 98 %  O2 Device Room Air  Pain Assessment  Pain Scale CPOT  Critical Care Pain Observation Tool (CPOT)  Facial Expression 0  Body Movements 0  Muscle Tension 0  Compliance with ventilator (intubated pts.) N/A  Vocalization (extubated pts.) 1  CPOT Total 1  Dialysis Weight  Weight (!) 151.6 kg  Type of Weight Pre-Dialysis  Time-Out for Hemodialysis  What Procedure? Hemodialysis   Pt Identifiers(min of two) First/Last Name;MRN/Account#  Correct Site? Yes  Correct Side? Yes  Correct Procedure? Yes  Consents Verified? Yes  Rad Studies Available? N/A  Safety Precautions Reviewed? Yes  Engineer, civil (consulting) Number 3  Station Number  ([id number])  UF/Alarm Test Passed  Conductivity: Meter 14  Conductivity: Machine  14  pH 7.4  Reverse Osmosis WRO4  Normal Saline Lot Number [id number]  Dialyzer Lot Number [id number]  Disposable Set Lot Number [id number]  Machine Temperature 98.6 F (37 C)  Musician and Audible Yes  Blood Lines Intact and Secured Yes  Pre Treatment Patient Checks  Vascular access used during treatment Catheter  Hepatitis B Surface Antigen Results Negative  Date Hepatitis B Surface Antigen Drawn 09/21/18  Hepatitis B Surface Antibody  (<10)  Date Hepatitis B Surface Antibody Drawn 09/21/18  Hemodialysis Consent Verified Yes  Hemodialysis Standing Orders Initiated Yes  ECG (Telemetry) Monitor On Yes  Prime Ordered Normal Saline  Length of  DialysisTreatment -hour(s) 2 Hour(s)  Dialysis Treatment Comments Na 140  Dialyzer Elisio 17H NR  Dialysate 3K, 2.5 Ca  Dialysate  Flow Ordered 300  Blood Flow Rate Ordered 200 mL/min  Ultrafiltration Goal 0 Liters  Dialysis Blood Pressure Support Ordered Normal Saline  Education / Care Plan  Dialysis Education Provided Yes  Documented Education in Care Plan Yes  Hemodialysis Catheter Left Internal jugular Triple lumen Temporary  Placement Date/Time: 09/21/18 1100   Placed prior to admission: No  Time Out: Correct patient;Correct procedure;Correct site  Maximum sterile barrier precautions: Hand hygiene;Sterile gown;Cap;Sterile gloves;Large sterile sheet;Mask  Site Prep: Chlorh...  Site Condition No complications  Blue Lumen Status Capped (Central line)  Red Lumen Status Capped (Central line)  Dressing Type Biopatch  Dressing Status Clean;Dry;Intact  Drainage Description None

## 2018-09-22 NOTE — Progress Notes (Signed)
[place] for heparin drip management Indication: DVT  Allergies  Allergen Reactions  . Lipitor [Atorvastatin] Hives and Itching  . Other   . Viagra  [Sildenafil Citrate]     vision loss    Patient Measurements: Height: 6\' 4"  (193 cm) Weight: (!) 320 lb (145.2 kg) IBW/kg (Calculated) : 86.8 Heparin Dosing Weight: 120 kg  Vital Signs: Temp: 98.2 F (36.8 C) (06/05 0200) Temp Source: Axillary (06/05 0200) BP: 123/82 (06/05 0600) Pulse Rate: 110 (06/05 0600)  Labs: Recent Labs    09/19/18 1312 09/20/18 0500 09/20/18 0948  09/20/18 1811  09/21/18 0120  09/21/18 1356 09/21/18 1905 09/21/18 2216 09/22/18 0020 09/22/18 0411  HGB  --  8.6*  --   --   --    < > 8.7*   < >  --  7.8*  --  7.8* 7.8*  HCT  --  28.4*  --   --   --    < > 28.0*   < >  --  24.8*  --  24.7* 24.7*  PLT  --  354  --   --   --   --  276  --   --   --   --   --  124*  APTT  --   --   --   --  35  --   --   --   --   --   --   --   --   HEPARINUNFRC  --   --   --    < > <0.10*  --  <0.10*   < > 0.12*  --  0.16*  --  0.28*  CREATININE  --  5.98*  --   --   --   --  7.73*  --   --   --   --   --  9.24*  CKTOTAL 716*  --  992*  --   --   --   --   --   --   --   --   --   --    < > = values in this interval not displayed.    Estimated Creatinine Clearance: 13.3 mL/min (A) (by C-G formula based on SCr of 9.24 mg/dL (H)).   Medical History: Past Medical History:  Diagnosis Date  . Allergy   . Anxiety   . Arthritis    knees  . Asthma, mild intermittent, well-controlled   . Chronic obstructive asthma ([place])   . COPD (chronic obstructive pulmonary disease) ([place])   . Depression with anxiety   . Diabetes ([place])   . GERD (gastroesophageal reflux disease)   . HBP (high blood pressure)   . High cholesterol   . Renal cyst, right   . Sleep apnea    CPAP  . Swelling     Medications:  Scheduled:  . sodium chloride   Intravenous Once  . aspirin EC  81 mg Oral  Daily  . budesonide (PULMICORT) nebulizer solution  0.5 mg Nebulization BID  . Chlorhexidine Gluconate Cloth  6 each Topical Q0600  . fentaNYL (SUBLIMAZE) injection  12.5 mcg Intravenous E7M  . folic acid  1 mg Oral Daily  . hydrocortisone sod succinate (SOLU-CORTEF) inj  50 mg Intravenous Q6H  . insulin aspart  0-15 Units Subcutaneous TID WC  . insulin aspart  0-5 Units Subcutaneous QHS  . insulin glargine  12 Units Subcutaneous QHS  . ipratropium-albuterol  3 mL  Nebulization Q4H  . mouth rinse  15 mL Mouth Rinse BID  . multivitamin with minerals  1 tablet Oral Daily  . thiamine  100 mg Oral Daily  . tranexamic acid (CYKLOKAPRON) topical -INTRAOP  2,000 mg Topical Once   Infusions:  . anidulafungin Stopped (09/21/18 1501)  . dexmedetomidine (PRECEDEX) IV infusion Stopped (09/20/18 2153)  . doxycycline (VIBRAMYCIN) IV Stopped (09/22/18 0012)  . heparin 2,450 Units/hr (09/22/18 0600)  . norepinephrine (LEVOPHED) Adult infusion Stopped (09/20/18 1953)  . piperacillin-tazobactam (ZOSYN)  IV 12.5 mL/hr at 09/22/18 0600  .  sodium bicarbonate (isotonic) infusion in sterile water 50 mL/hr at 09/22/18 0600    Assessment: Pharmacy consulted for heparin drip management for 58 yo male being treated with heparin for left popliteal vein DVT. Patient with bilateral knee replacement on 5/15. Patient [facility] to [facility] on 5/26 with weakness and pain. Patient underwent I&D of left knee on 5/31 and right knee on 6/1. Patient prior to admission was on rivaroxaban 10mg . This was not continued in house and anti-Xa level was <0.1.   Goal of Therapy:  Heparin level 0.3-0.7 units/ml Monitor platelets by anticoagulation protocol: Yes   Plan:  06/04 @ 2200 HL 0.28 subtherapeutic, no interruptions. Will increase rate to 2550 units/hr and will recheck HL @ 1200. CBC low but stable, will continue to monitor.  Tobie Lords, PharmD, BCPS Clinical Pharmacist 09/22/2018

## 2018-09-23 DIAGNOSIS — D696 Thrombocytopenia, unspecified: Secondary | ICD-10-CM

## 2018-09-23 LAB — COMPREHENSIVE METABOLIC PANEL
ALT: 28 U/L (ref 0–44)
AST: 48 U/L — ABNORMAL HIGH (ref 15–41)
Albumin: 1.9 g/dL — ABNORMAL LOW (ref 3.5–5.0)
Alkaline Phosphatase: 59 U/L (ref 38–126)
Anion gap: 12 (ref 5–15)
BUN: 60 mg/dL — ABNORMAL HIGH (ref 6–20)
CO2: 23 mmol/L (ref 22–32)
Calcium: 8.1 mg/dL — ABNORMAL LOW (ref 8.9–10.3)
Chloride: 109 mmol/L (ref 98–111)
Creatinine, Ser: 9.18 mg/dL — ABNORMAL HIGH (ref 0.61–1.24)
GFR calc Af Amer: 6 mL/min — ABNORMAL LOW (ref >60)
GFR calc non Af Amer: 6 mL/min — ABNORMAL LOW (ref >60)
Glucose, Bld: 104 mg/dL — ABNORMAL HIGH (ref 70–99)
Potassium: 3.2 mmol/L — ABNORMAL LOW (ref 3.5–5.1)
Sodium: 144 mmol/L (ref 135–145)
Total Bilirubin: 0.5 mg/dL (ref 0.3–1.2)
Total Protein: 6.4 g/dL — ABNORMAL LOW (ref 6.5–8.1)

## 2018-09-23 LAB — CBC WITH DIFFERENTIAL/PLATELET
Abs Immature Granulocytes: 0.16 10*3/uL — ABNORMAL HIGH (ref 0.00–0.07)
Basophils Absolute: 0 10*3/uL (ref 0.0–0.1)
Basophils Relative: 0 %
Eosinophils Absolute: 0 10*3/uL (ref 0.0–0.5)
Eosinophils Relative: 0 %
HCT: 23.8 % — ABNORMAL LOW (ref 39.0–52.0)
Hemoglobin: 7.7 g/dL — ABNORMAL LOW (ref 13.0–17.0)
Immature Granulocytes: 2 %
Lymphocytes Relative: 11 %
Lymphs Abs: 1.1 10*3/uL (ref 0.7–4.0)
MCH: 26.6 pg (ref 26.0–34.0)
MCHC: 32.4 g/dL (ref 30.0–36.0)
MCV: 82.4 fL (ref 80.0–100.0)
Monocytes Absolute: 1.2 10*3/uL — ABNORMAL HIGH (ref 0.1–1.0)
Monocytes Relative: 12 %
Neutro Abs: 7.5 10*3/uL (ref 1.7–7.7)
Neutrophils Relative %: 75 %
Platelets: 102 10*3/uL — ABNORMAL LOW (ref 150–400)
RBC: 2.89 MIL/uL — ABNORMAL LOW (ref 4.22–5.81)
RDW: 15.6 % — ABNORMAL HIGH (ref 11.5–15.5)
WBC: 9.9 10*3/uL (ref 4.0–10.5)
nRBC: 0 % (ref 0.0–0.2)

## 2018-09-23 LAB — GLUCOSE, CAPILLARY
Glucose-Capillary: 117 mg/dL — ABNORMAL HIGH (ref 70–99)
Glucose-Capillary: 137 mg/dL — ABNORMAL HIGH (ref 70–99)
Glucose-Capillary: 147 mg/dL — ABNORMAL HIGH (ref 70–99)
Glucose-Capillary: 165 mg/dL — ABNORMAL HIGH (ref 70–99)
Glucose-Capillary: 64 mg/dL — ABNORMAL LOW (ref 70–99)
Glucose-Capillary: 73 mg/dL (ref 70–99)
Glucose-Capillary: 79 mg/dL (ref 70–99)
Glucose-Capillary: 80 mg/dL (ref 70–99)
Glucose-Capillary: 85 mg/dL (ref 70–99)
Glucose-Capillary: 87 mg/dL (ref 70–99)
Glucose-Capillary: 93 mg/dL (ref 70–99)
Glucose-Capillary: 99 mg/dL (ref 70–99)

## 2018-09-23 LAB — CK: Total CK: 100 U/L (ref 49–397)

## 2018-09-23 LAB — PHOSPHORUS: Phosphorus: 4.1 mg/dL (ref 2.5–4.6)

## 2018-09-23 LAB — MAGNESIUM: Magnesium: 2.2 mg/dL (ref 1.7–2.4)

## 2018-09-23 LAB — APTT
aPTT: 91 seconds — ABNORMAL HIGH (ref 24–36)
aPTT: 64 seconds — ABNORMAL HIGH (ref 24–36)
aPTT: 63 seconds — ABNORMAL HIGH (ref 24–36)

## 2018-09-23 LAB — HEPARIN LEVEL (UNFRACTIONATED): Heparin Unfractionated: 0.47 IU/mL (ref 0.30–0.70)

## 2018-09-23 MED ORDER — SODIUM CHLORIDE 0.9% FLUSH
3.0000 mL | Freq: Two times a day (BID) | INTRAVENOUS | Status: DC
  Administered 2018-09-23 – 2018-10-11 (×24): 3 mL via INTRAVENOUS

## 2018-09-23 MED ORDER — DEXTROSE 50 % IV SOLN
INTRAVENOUS | Status: AC
  Filled 2018-09-23: qty 50

## 2018-09-23 MED ORDER — ARGATROBAN 50 MG/50ML IV SOLN
0.4000 ug/kg/min | INTRAVENOUS | Status: AC
  Administered 2018-09-23 – 2018-09-28 (×11): 0.5 ug/kg/min via INTRAVENOUS
  Filled 2018-09-23 (×11): qty 50

## 2018-09-23 MED ORDER — SODIUM CHLORIDE 0.9 % IV SOLN
250.0000 mL | INTRAVENOUS | Status: DC | PRN
  Administered 2018-09-30: 16:00:00 250 mL via INTRAVENOUS

## 2018-09-23 MED ORDER — IPRATROPIUM-ALBUTEROL 0.5-2.5 (3) MG/3ML IN SOLN
3.0000 mL | Freq: Four times a day (QID) | RESPIRATORY_TRACT | Status: DC
  Administered 2018-09-23 – 2018-09-25 (×7): 3 mL via RESPIRATORY_TRACT
  Filled 2018-09-23 (×7): qty 3

## 2018-09-23 MED ORDER — INSULIN REGULAR(HUMAN) IN NACL 100-0.9 UT/100ML-% IV SOLN: INTRAVENOUS | Status: DC

## 2018-09-23 MED ORDER — INSULIN ASPART 100 UNIT/ML ~~LOC~~ SOLN
0.0000 [IU] | SUBCUTANEOUS | Status: DC
  Administered 2018-09-23: 3 [IU] via SUBCUTANEOUS
  Administered 2018-09-23: 4 [IU] via SUBCUTANEOUS
  Administered 2018-09-24: 7 [IU] via SUBCUTANEOUS
  Administered 2018-09-24: 4 [IU] via SUBCUTANEOUS
  Administered 2018-09-24: 3 [IU] via SUBCUTANEOUS
  Administered 2018-09-24: 4 [IU] via SUBCUTANEOUS
  Administered 2018-09-24: 3 [IU] via SUBCUTANEOUS
  Administered 2018-09-25: 4 [IU] via SUBCUTANEOUS
  Administered 2018-09-25: 09:00:00 7 [IU] via SUBCUTANEOUS
  Administered 2018-09-25: 13:00:00 15 [IU] via SUBCUTANEOUS
  Administered 2018-09-25 (×2): 7 [IU] via SUBCUTANEOUS
  Administered 2018-09-25 – 2018-09-26 (×2): 3 [IU] via SUBCUTANEOUS
  Filled 2018-09-23 (×14): qty 1

## 2018-09-23 MED ORDER — SODIUM CHLORIDE 0.9% FLUSH
3.0000 mL | INTRAVENOUS | Status: DC | PRN
  Administered 2018-09-24: 3 mL via INTRAVENOUS
  Filled 2018-09-23: qty 3

## 2018-09-23 MED ORDER — DEXTROSE 50 % IV SOLN
12.5000 g | Freq: Once | INTRAVENOUS | Status: AC
  Administered 2018-09-23: 07:00:00 12.5 g via INTRAVENOUS

## 2018-09-23 NOTE — Consult Note (Signed)
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  Patient Measurements: Height: 6\' 4"  (193 cm) Weight: (!) 333 lb 12.4 oz (151.4 kg) IBW/kg (Calculated) : 86.8  Vital Signs: Temp: 97.7 F (36.5 C) (06/06 1400) Temp Source: Oral (06/06 1400) BP: 161/82 (06/06 1400) Pulse Rate: 106 (06/06 1500)  Labs: Recent Labs    09/20/18 1811  09/22/18 0411 09/22/18 0700 09/22/18 1220 09/22/18 1640 09/22/18 2013 09/22/18 2306 09/23/18 0407 09/23/18 0620 09/23/18 1207 09/23/18 1525  HGB  --    < > 7.8* 7.8*  --   --  7.7*  --  7.7*  --   --   --   HCT  --    < > 24.7* 24.5*  --   --  23.9*  --  23.8*  --   --   --   PLT  --    < > 124*  --   --   --  107*  --  102*  --   --   --   APTT 35  --   --   --   --   --   --   --   --   --   --  91*  HEPARINUNFRC <0.10*   < > 0.28*  --  0.17*  --   --  0.41  --  0.47  --   --   CREATININE  --    < > 9.24*  --   --  8.11* 8.89*  --  9.18*  --   --   --   CKTOTAL  --   --   --   --   --   --   --   --   --   --  100  --    < > = values in this interval not displayed.    Estimated Creatinine Clearance: 13.6 mL/min (A) (by C-G formula based on SCr of 9.18 mg/dL (H)).   Medical History: Past Medical History:  Diagnosis Date  . Allergy   . Anxiety   . Arthritis    knees  . Asthma, mild intermittent, well-controlled   . Chronic obstructive asthma ([place])   . COPD (chronic obstructive pulmonary disease) ([place])   . Depression with anxiety   . Diabetes ([place])   . GERD (gastroesophageal reflux disease)   . HBP (high blood pressure)   . High cholesterol   . Renal cyst, right   . Sleep apnea    CPAP  . Swelling     Medications:  Scheduled:  . aspirin EC  81 mg Oral Daily  . budesonide (PULMICORT) nebulizer solution  0.5 mg Nebulization BID  . Chlorhexidine Gluconate Cloth  6 each Topical Q0600  . dextrose      . feeding supplement (NEPRO CARB STEADY)  237 mL Oral BID BM  . folic acid  1 mg  Intravenous Daily  . hydrocortisone sod succinate (SOLU-CORTEF) inj  50 mg Intravenous Q6H  . insulin aspart  0-20 Units Subcutaneous Q4H  . ipratropium-albuterol  3 mL Nebulization Q6H  . mouth rinse  15 mL Mouth Rinse BID  . multivitamin  1 tablet Oral QHS  . multivitamin with minerals  1 tablet Oral Daily  . sodium chloride flush  3 mL Intravenous Q12H  . thiamine injection  100 mg Intravenous Daily  . tranexamic acid (CYKLOKAPRON) topical -INTRAOP  2,000 mg Topical Once    Assessment: 58 yr male had  B/l knee replacements Sep 01, 2018 was admitted with fever, AKI/ Platelet count has been declining since 6/5. He was started on heparin 6/3. Child Pugh score estimated to be 5-6  Baseline aPTT: 35s 09/23/18@1525 : aPTT 91s    Goal of Therapy:  aPTT 50-90 seconds Monitor platelets by anticoagulation protocol: Yes   Plan:  --Level slightly supratherapeutic. Will continue with argatroban 0.5 mcg/kg/min --baseline aPTT 35s --check aPTT every 2 hours until two therapeutic levels are obtained --daily CBC  [name], PharmD 09/23/2018,3:52 PM

## 2018-09-23 NOTE — Progress Notes (Signed)
[place] at [place] NAME: Isaac Cross    MR#:  [id number]  DATE OF BIRTH:  1960/11/20  SUBJECTIVE:   -  Appears critically ill, very confused -T-max is improved in the last 48 hours.  Has a left leg clot on heparin gtt -Renal function worsening, decreased urine output-now started on HD  REVIEW OF SYSTEMS:  Review of Systems  Unable to perform ROS: Critical illness   DRUG ALLERGIES:   Allergies  Allergen Reactions  . Lipitor [Atorvastatin] Hives and Itching  . Other   . Viagra  [Sildenafil Citrate]     vision loss    VITALS:  Blood pressure (!) 144/108, pulse (!) 109, temperature 98 F (36.7 C), temperature source Oral, resp. rate 20, height 6\' 4"  (1.93 m), weight (!) 151.4 kg, SpO2 95 %.  PHYSICAL EXAMINATION:  Physical Exam   GENERAL:  58 y.o.-year-old patient lying in the bed with no acute distress. Critically ill appearing EYES: Pupils equal, round, reactive to light and accommodation. No scleral icterus. Extraocular muscles intact.  HEENT: Head atraumatic, normocephalic. Oropharynx and nasopharynx clear.  NECK:  Supple, no jugular venous distention. No thyroid enlargement, no tenderness.  LUNGS: Normal breath sounds bilaterally, no wheezing, rales,rhonchi or crepitation. No use of accessory muscles of respiration.  Decreased bibasilar breath sounds CARDIOVASCULAR: S1, S2 normal. No murmurs, rubs, or gallops. left chest dialysis catheter noted ABDOMEN: Soft, nontender, nondistended. Bowel sounds present. No organomegaly or mass.  EXTREMITIES: Swelling of both legs, knees noted with recent surgery.  No  cyanosis, or clubbing.  myoclonic jerks noted Both hands are swollen as well. NEUROLOGIC: Cranial nerves II through XII are intact.  Extremely confused.  Not following commands.  Myoclonic jerks noted.  Withdrawing to pain. PSYCHIATRIC: The patient is alert and disoriented.  Intermittent confusion noted SKIN: No obvious  rash, lesion, or ulcer.    LABORATORY PANEL:   CBC Recent Labs  Lab 09/23/18 0407  WBC 9.9  HGB 7.7*  HCT 23.8*  PLT 102*   ------------------------------------------------------------------------------------------------------------------  Chemistries  Recent Labs  Lab 09/23/18 0407  NA 144  K 3.2*  CL 109  CO2 23  GLUCOSE 104*  BUN 60*  CREATININE 9.18*  CALCIUM 8.1*  MG 2.2  AST 48*  ALT 28  ALKPHOS 59  BILITOT 0.5   ------------------------------------------------------------------------------------------------------------------  Cardiac Enzymes No results for input(s): TROPONINI in the last 168 hours. ------------------------------------------------------------------------------------------------------------------  RADIOLOGY:  No results found.  EKG:   Orders placed or performed during the hospital encounter of 09/12/18  . EKG 12-Lead  . EKG 12-Lead    ASSESSMENT AND PLAN:    EdwardGradyis a58 y.o.malewith a known history of arthritis, COPD not on home oxygen, hypertension, sleep apnea, non-insulin-dependent diabetes mellitus presents to hospital secondary to dizziness, weakness and a fall  1.Sepsis- with ongoing fevers. T max is improving now   -s/p Recent bilateral knee replacement surgeries as outpatient and now bilateral knee washouts done this adm. - fluid Cultures are negative so far. -  Fungal cultures negative -Appreciate ID consult.  Patient on zosyn, doxycycline and anidulafungin to cover fungal orgms - If spiking fevers again- Will need a CT of his chest abdomen and pelvis when stable.   -Off pressors today.  Received IV albumin as well.  Remains on stress dose steroids  2. Acute renal failure-likely ATN from sepsis and hypotension -Much worsening creatinine and decreased urine output - appreciate nephrology input. -Patient getting more encephalopathic.  Creatinine  greater than 9, even though potassium is within normal limits,  metabolic acidosis noted with low bicarb. -Hemodialysis started 09/22/2018, dialysis catheter placed. -Hold lisinopril and metformin and other nephrotoxins.     3. Acute encephalopathy-secondary to metabolic causes, sepsis and uremia.   -  No focal deficits  4. Left popliteal vein DVT- was on heparin drip--platelets dropping--IV argatroban  5. Diabetes mellitus- sliding scale insulin and Lantus has been added while inpatient - insulin drip to be started today -Hold metformin given worsening renal failure.  6. DVT prophylaxis- argatroban drip  Physical therapy consult when more stable Patient is critically ill  CODE STATUS: Full code  TOTAL TIME TAKING CARE OF THIS PATIENT: 28 minutes.   POSSIBLE D/C IN ?? DAYS, DEPENDING ON CLINICAL CONDITION.   Isaac Cross M.D on 09/23/2018 at 1:54 PM  Between 7am to 6pm - Pager - [phone]  After 6pm go to [email or web address] - password [email or web address] [facility]  Office  [phone]  CC: Primary care physician; Isaac Sizer, MD

## 2018-09-23 NOTE — Progress Notes (Signed)
[facility]  ROUNDING NOTE   Subjective:    06/05 0701 - 06/06 0700 In: 2809.4 [I.V.:2087.7; IV Piggyback:721.7] Out: 922 [Urine:300; Stool:300]  S Creatinine significantly higher at 3.42-> 6->7.7->9.2 No new labs today UOP remains low,  Patient is more alert. Answeres simple Qs but not oriented    Objective:  Vital signs in last 24 hours:  Temp:  [98.2 F (36.8 C)-99.3 F (37.4 C)] 98.2 F (36.8 C) (06/06 1015) Pulse Rate:  [82-116] 82 (06/06 1045) Resp:  [14-29] 14 (06/06 1045) BP: (119-177)/(71-115) 150/105 (06/06 1045) SpO2:  [87 %-100 %] 93 % (06/06 1045) FiO2 (%):  [30 %-40 %] 30 % (06/06 0752) Weight:  [150.4 kg-151.6 kg] 150.4 kg (06/06 1000)  Weight change:  Filed Weights   09/12/18 0935 09/22/18 1300 09/23/18 1000  Weight: (!) 145.2 kg (!) 151.6 kg (!) 150.4 kg    Intake/Output: I/O last 3 completed shifts: In: 4476.9 [I.V.:3171.6; IV Piggyback:1305.3] Out: 1247 [Urine:425; Other:322; Stool:500]   Intake/Output this shift:  No intake/output data recorded.  Physical Exam: General: NAD,critically  ill appearing  Head:  Dry oral mucosal membranes  Eyes: Anicteric,    Lungs:  Coarse breath sounds, room air  Heart: tachycardic  Abdomen:  Soft, nontender, obese, distended, decreased BS  Extremities:  ++ peripheral edema., b.l knee dressing  Neurologic: Alert;  Oriented to self, encephalopathic  Skin: warm    Foley in place, rectal tube    Basic Metabolic Panel: Recent Labs  Lab 09/17/18 0543  09/19/18 0439 09/20/18 0500 09/21/18 0120 09/22/18 0411 09/22/18 1640 09/22/18 2013 09/23/18 0407  NA 135   < > 134* 137 139 140 142 142 144  K 3.4*   < > 3.8 3.9 4.3 3.5 3.1* 3.2* 3.2*  CL 98   < > 101 107 111 108 107 107 109  CO2 22   < > 22 19* 17* 16* 20* 22 23  GLUCOSE 201*   < > 167* 174* 192* 303* 258* 222* 104*  BUN 19   < > 22* 30* 37* 58* 54* 55* 60*  CREATININE 2.46*   < > 3.42* 5.98* 7.73* 9.24* 8.11* 8.89* 9.18*  CALCIUM 9.1    < > 8.2* 8.2* 8.3* 8.1* 8.0* 7.9* 8.1*  MG 1.5*   < > 1.9 2.0 2.4  --   --  2.2 2.2  PHOS 4.5  --   --   --   --   --   --  4.0 4.1   < > = values in this interval not displayed.    Liver Function Tests: Recent Labs  Lab 09/19/18 0439 09/20/18 0505 09/22/18 0411 09/23/18 0407  AST 70* 64* 44* 48*  ALT 43 35 27 28  ALKPHOS 71 67 62 59  BILITOT 0.6 0.7 0.8 0.5  PROT 6.0* 6.1* 6.1* 6.4*  ALBUMIN 1.9* 1.8* 1.8* 1.9*   No results for input(s): LIPASE, AMYLASE in the last 168 hours. Recent Labs  Lab 09/20/18 0948 09/22/18 2014  AMMONIA 12 10    CBC: Recent Labs  Lab 09/19/18 0439 09/20/18 0500  09/21/18 0120  09/22/18 0020 09/22/18 0411 09/22/18 0700 09/22/18 2013 09/23/18 0407  WBC 11.2* 13.5*  --  11.1*  --   --  10.3  --  9.5 9.9  NEUTROABS 6.4  --   --  8.0*  --   --  8.5*  --   --  7.5  HGB 9.0* 8.6*   < > 8.7*   < >  7.8* 7.8* 7.8* 7.7* 7.7*  HCT 28.4* 28.4*   < > 28.0*   < > 24.7* 24.7* 24.5* 23.9* 23.8*  MCV 85.3 85.8  --  86.2  --   --  84.3  --  83.3 82.4  PLT 269 354  --  276  --   --  124*  --  107* 102*   < > = values in this interval not displayed.    Cardiac Enzymes: Recent Labs  Lab 09/19/18 1312 09/20/18 0948  CKTOTAL 716* 992*    BNP: Invalid input(s): POCBNP  CBG: Recent Labs  Lab 09/23/18 0500 09/23/18 0610 09/23/18 0642 09/23/18 0659 09/23/18 0750  GLUCAP 87 73 64* 93 80    Microbiology: Results for orders placed or performed during the hospital encounter of 09/12/18  Blood Culture (routine x 2)     Status: None   Collection Time: 09/12/18  9:56 AM  Result Value Ref Range Status   Specimen Description BLOOD LEFT HAND  Final   Special Requests   Final    BOTTLES DRAWN AEROBIC AND ANAEROBIC Blood Culture adequate volume   Culture   Final    NO GROWTH 5 DAYS Performed at [facility], [address], [place], [place] [address]    Report Status 09/17/2018 FINAL  Final  Urine culture     Status: None   Collection  Time: 09/12/18 10:01 AM  Result Value Ref Range Status   Specimen Description   Final    URINE, RANDOM Performed at [facility], [address], [place], [place] [address]    Special Requests   Final    NONE Performed at [facility], [address], [place], [place] [address]    Culture   Final    NO GROWTH Performed at [facility], [place] [address], [place], [place] [address]    Report Status 09/13/2018 FINAL  Final  SARS Coronavirus 2 (CEPHEID- Performed in [place] hospital lab), Hosp Order     Status: None   Collection Time: 09/12/18 10:02 AM  Result Value Ref Range Status   SARS Coronavirus 2 NEGATIVE NEGATIVE Final    Comment: (NOTE) If result is NEGATIVE SARS-CoV-2 target nucleic acids are NOT DETECTED. The SARS-CoV-2 RNA is generally detectable in upper and lower  respiratory specimens during the acute phase of infection. The lowest  concentration of SARS-CoV-2 viral copies this assay can detect is 250  copies / mL. A negative result does not preclude SARS-CoV-2 infection  and should not be used as the sole basis for treatment or other  patient management decisions.  A negative result may occur with  improper specimen collection / handling, submission of specimen other  than nasopharyngeal swab, presence of viral mutation(s) within the  areas targeted by this assay, and inadequate number of viral copies  (<250 copies / mL). A negative result must be combined with clinical  observations, patient history, and epidemiological information. If result is POSITIVE SARS-CoV-2 target nucleic acids are DETECTED. The SARS-CoV-2 RNA is generally detectable in upper and lower  respiratory specimens dur ing the acute phase of infection.  Positive  results are indicative of active infection with SARS-CoV-2.  Clinical  correlation with patient history and other diagnostic information is  necessary to determine patient infection status.  Positive results  do  not rule out bacterial infection or co-infection with other viruses. If result is PRESUMPTIVE POSTIVE SARS-CoV-2 nucleic acids MAY BE PRESENT.   A presumptive positive  result was obtained on the submitted specimen  and confirmed on repeat testing.  While 2019 novel coronavirus  (SARS-CoV-2) nucleic acids may be present in the submitted sample  additional confirmatory testing may be necessary for epidemiological  and / or clinical management purposes  to differentiate between  SARS-CoV-2 and other Sarbecovirus currently known to infect humans.  If clinically indicated additional testing with an alternate test  methodology [phone]) is advised. The SARS-CoV-2 RNA is generally  detectable in upper and lower respiratory sp ecimens during the acute  phase of infection. The expected result is Negative. Fact Sheet for Patients:  StrictlyIdeas.no Fact Sheet for Healthcare Providers: BankingDealers.co.za This test is not yet approved or cleared by the [place] FDA and has been authorized for detection and/or diagnosis of SARS-CoV-2 by FDA under an Emergency Use Authorization (EUA).  This EUA will remain in effect (meaning this test can be used) for the duration of the COVID-19 declaration under Section 564(b)(1) of the Act, 21 U.S.C. section 360bbb-3(b)(1), unless the authorization is terminated or revoked sooner. Performed at [facility], [address] [place], [place] [address]   Blood Culture (routine x 2)     Status: None   Collection Time: 09/12/18 10:06 AM  Result Value Ref Range Status   Specimen Description BLOOD LEFT ANTECUBITAL  Final   Special Requests   Final    BOTTLES DRAWN AEROBIC AND ANAEROBIC Blood Culture adequate volume   Culture   Final    NO GROWTH 5 DAYS Performed at [facility], [address] [place], [place] [address]    Report Status 09/17/2018 FINAL  Final  MRSA PCR Screening      Status: None   Collection Time: 09/12/18  7:04 PM  Result Value Ref Range Status   MRSA by PCR NEGATIVE NEGATIVE Final    Comment:        The GeneXpert MRSA Assay (FDA approved for NASAL specimens only), is one component of a comprehensive MRSA colonization surveillance program. It is not intended to diagnose MRSA infection nor to guide or monitor treatment for MRSA infections. Performed at [facility], [address] [place], [place] [address]   C difficile quick scan w PCR reflex     Status: None   Collection Time: 09/14/18  2:21 PM  Result Value Ref Range Status   C Diff antigen NEGATIVE NEGATIVE Final   C Diff toxin NEGATIVE NEGATIVE Final   C Diff interpretation No C. difficile detected.  Final    Comment: Performed at [facility], [address] [place], [place] [address]  Group A Strep by PCR     Status: None   Collection Time: 09/15/18  1:53 PM  Result Value Ref Range Status   Group A Strep by PCR NOT DETECTED NOT DETECTED Final    Comment: Performed at [facility], [address] [place], [place] [address]  Body fluid culture     Status: None   Collection Time: 09/15/18  5:13 PM  Result Value Ref Range Status   Specimen Description   Final    KNEE Performed at [facility], [address], [place], [place] [address]    Special Requests   Final    NONE Performed at [facility], [address] [place], [place] [address]    Gram Stain   Final    RARE RBCS FEW WBC SEEN NO ORGANISMS SEEN Performed at [facility], [place],  [place] [address]    Culture   Final    NO GROWTH 3 DAYS Performed at [facility], [place] [address] [place], [place] [address]    Report Status 09/19/2018 FINAL  Final  Body fluid culture     Status: None   Collection Time: 09/16/18  8:55 AM  Result Value Ref Range Status   Specimen Description KNEE RIGHT  Final   Special Requests NONE  Final   Gram  Stain   Final    RARE WBC PRESENT, PREDOMINANTLY PMN NO ORGANISMS SEEN    Culture   Final    NO GROWTH 3 DAYS Performed at [facility], [place] [address], [place], [place] [address]    Report Status 09/20/2018 FINAL  Final  Aerobic/Anaerobic Culture (surgical/deep wound)     Status: None   Collection Time: 09/17/18 11:32 AM  Result Value Ref Range Status   Specimen Description   Final    TISSUE LEFT KNEE 1 Performed at [facility], [address] [place], [place] [address]    Special Requests PATIENT ON FOLLOWING MAXIPIME VANCOMYCIN  Final   Gram Stain   Final    FEW WBC PRESENT,BOTH PMN AND MONONUCLEAR NO ORGANISMS SEEN    Culture   Final    No growth aerobically or anaerobically. Performed at [facility], [place] [address], [place], [place] [address]    Report Status 09/22/2018 FINAL  Final  Acid Fast Smear (AFB)     Status: None   Collection Time: 09/17/18 11:32 AM  Result Value Ref Range Status   AFB Specimen Processing Comment  Final    Comment: Tissue Grinding and Digestion/Decontamination   Acid Fast Smear Negative  Final    Comment: (NOTE) Performed At: [facility] [place], [place] [id number] Rush Farmer MD [id number]    Source (AFB) TISSUE  Final    Comment: LEFT KNEE Performed at [facility], [place] [address] [place], [place] [address]   Culture, fungus without smear     Status: None (Preliminary result)   Collection Time: 09/17/18 11:34 AM  Result Value Ref Range Status   Specimen Description   Final    SYNOVIAL Performed at [facility], [address] [place], [place] [address]    Special Requests   Final    NONE Performed at [facility], [address], [place], [place] [address]    Culture   Final    NO FUNGUS ISOLATED AFTER 2 DAYS Performed at [facility], [place] [address] [place], [place] [address]    Report Status PENDING  Incomplete  Acid Fast Culture with  reflexed sensitivities     Status: None   Collection Time: 09/17/18 11:36 AM  Result Value Ref Range Status   Acid Fast Culture CANC  Final    Comment: (NOTE) Test cancelled at client's request.      Cancelled per Levin Erp 09/21/2018 Performed At: [facility] [place], [place] [id number] Rush Farmer MD [id number]    Source of Sample TISSUE  Final    Comment: RIGHT KNEE Performed at [facility], [place] [address] [place],  [address]   Aerobic/Anaerobic Culture (surgical/deep wound)     Status: None   Collection Time: 09/17/18 11:43 AM  Result Value Ref Range Status   Specimen Description   Final    TISSUE LEFT KNEE 2 Performed at [place]  [address] [place], [place] [address]    Special Requests PATIENT ON FOLLOWING MAXIPIME VANCOMYCIN  Final   Gram Stain   Final    FEW WBC PRESENT, PREDOMINANTLY PMN NO ORGANISMS SEEN    Culture   Final    No growth aerobically or anaerobically. Performed at [facility], [place] [address], [place], [place] [address]    Report Status 09/22/2018 FINAL  Final  Aerobic/Anaerobic Culture (surgical/deep wound)     Status: None   Collection Time: 09/17/18 11:44 AM  Result Value Ref Range Status   Specimen Description   Final    TISSUE LEFT KNEE 3 Performed at [facility], [address] [place], [place] [address]    Special Requests PATIENT ON FOLLOWING MAXIPIME VANCOMYCIN  Final   Gram Stain   Final    MODERATE WBC PRESENT, PREDOMINANTLY PMN NO ORGANISMS SEEN    Culture   Final    No growth aerobically or anaerobically. Performed at [facility], [place] [address], [place], [place] [address]    Report Status 09/22/2018 FINAL  Final  Aerobic/Anaerobic Culture (surgical/deep wound)     Status: None (Preliminary result)   Collection Time: 09/18/18  9:43 PM  Result Value Ref Range Status   Specimen Description   Final    SYNOVIAL Performed at [facility], [address], [place], [place] [address]    Special Requests   Final    NONE Performed at [facility], [address] [place], [place] [address]    Gram Stain   Final    RARE WBC PRESENT, PREDOMINANTLY MONONUCLEAR NO ORGANISMS SEEN    Culture   Final    NO GROWTH 4 DAYS NO ANAEROBES ISOLATED; CULTURE IN PROGRESS FOR 5 DAYS Performed at [facility], [place] [address] [place], [place] [address]    Report Status PENDING  Incomplete  Aerobic/Anaerobic Culture (surgical/deep wound)     Status: None (Preliminary result)   Collection Time: 09/18/18  9:44 PM  Result Value Ref Range Status   Specimen Description   Final    SYNOVIAL Performed at [facility], [address], [place], [place] [address]    Special Requests   Final    RIGHT KNEE Performed at [facility], [address] [place], [place] [address]    Gram Stain   Final    FEW WBC PRESENT,BOTH PMN AND MONONUCLEAR NO ORGANISMS SEEN    Culture   Final    NO GROWTH 4 DAYS NO ANAEROBES ISOLATED; CULTURE IN PROGRESS FOR 5 DAYS Performed at [facility], [place] [address] [place], [place] [address]    Report Status PENDING  Incomplete  Aerobic/Anaerobic Culture (surgical/deep wound)     Status: None (Preliminary result)   Collection Time: 09/18/18  9:45 PM  Result Value Ref Range Status   Specimen Description   Final    SYNOVIAL Performed at [facility], [address] [place], [place] [address]    Special Requests   Final    RIGHT KNEE Performed at [facility], [address] [place], [place] [address]    Gram Stain   Final    FEW WBC PRESENT, PREDOMINANTLY MONONUCLEAR NO ORGANISMS SEEN    Culture   Final    NO GROWTH 4 DAYS NO ANAEROBES ISOLATED; CULTURE IN PROGRESS FOR 5 DAYS Performed at [facility], [place] [address] [place], [place] [address]    Report  Status PENDING  Incomplete  Culture, blood (Routine X 2) w Reflex to ID  Panel     Status: None (Preliminary result)   Collection Time: 09/19/18  1:11 PM  Result Value Ref Range Status   Specimen Description BLOOD LEFT ANTECUBITAL  Final   Special Requests   Final    BOTTLES DRAWN AEROBIC AND ANAEROBIC Blood Culture results may not be optimal due to an excessive volume of blood received in culture bottles   Culture   Final    NO GROWTH 4 DAYS Performed at [facility], [address] [place], [place] [address]    Report Status PENDING  Incomplete  Culture, blood (Routine X 2) w Reflex to ID Panel     Status: None (Preliminary result)   Collection Time: 09/19/18  1:20 PM  Result Value Ref Range Status   Specimen Description BLOOD BLOOD RIGHT HAND  Final   Special Requests   Final    BOTTLES DRAWN AEROBIC AND ANAEROBIC Blood Culture results may not be optimal due to an excessive volume of blood received in culture bottles   Culture   Final    NO GROWTH 4 DAYS Performed at [facility], [address] [place], [place] [address]    Report Status PENDING  Incomplete  Urine Culture     Status: None   Collection Time: 09/20/18  3:59 PM  Result Value Ref Range Status   Specimen Description   Final    URINE, RANDOM Performed at [facility], [address] [place], [place] [address]    Special Requests   Final    NONE Performed at [facility], [address] [place], [place] [address]    Culture   Final    NO GROWTH Performed at [facility], [place] [address] [place], [place] [address]    Report Status 09/21/2018 FINAL  Final  C difficile quick scan w PCR reflex     Status: None   Collection Time: 09/21/18  3:27 PM  Result Value Ref Range Status   C Diff antigen NEGATIVE NEGATIVE Final   C Diff toxin NEGATIVE NEGATIVE Final   C Diff interpretation No C. difficile detected.  Final    Comment: Performed at [facility], [address] [place], [place] [address]  Gastrointestinal Panel by PCR ,  Stool     Status: None   Collection Time: 09/21/18  3:27 PM  Result Value Ref Range Status   Campylobacter species NOT DETECTED NOT DETECTED Final   Plesimonas shigelloides NOT DETECTED NOT DETECTED Final   Salmonella species NOT DETECTED NOT DETECTED Final   Yersinia enterocolitica NOT DETECTED NOT DETECTED Final   Vibrio species NOT DETECTED NOT DETECTED Final   Vibrio cholerae NOT DETECTED NOT DETECTED Final   Enteroaggregative E coli (EAEC) NOT DETECTED NOT DETECTED Final   Enteropathogenic E coli (EPEC) NOT DETECTED NOT DETECTED Final   Enterotoxigenic E coli (ETEC) NOT DETECTED NOT DETECTED Final   Shiga like toxin producing E coli (STEC) NOT DETECTED NOT DETECTED Final   Shigella/Enteroinvasive E coli (EIEC) NOT DETECTED NOT DETECTED Final   Cryptosporidium NOT DETECTED NOT DETECTED Final   Cyclospora cayetanensis NOT DETECTED NOT DETECTED Final   Entamoeba histolytica NOT DETECTED NOT DETECTED Final   Giardia lamblia NOT DETECTED NOT DETECTED Final   Adenovirus F40/41 NOT DETECTED NOT DETECTED Final   Astrovirus NOT DETECTED NOT DETECTED Final   Norovirus GI/GII NOT DETECTED NOT DETECTED Final  Rotavirus A NOT DETECTED NOT DETECTED Final   Sapovirus (I, II, IV, and V) NOT DETECTED NOT DETECTED Final    Comment: Performed at [facility], [address] [place], [place] [address]    Coagulation Studies: No results for input(s): LABPROT, INR in the last 72 hours.  Urinalysis: Recent Labs    09/20/18 1559  COLORURINE AMBER*  LABSPEC 1.016  PHURINE 5.0  GLUCOSEU NEGATIVE  HGBUR LARGE*  BILIRUBINUR NEGATIVE  KETONESUR NEGATIVE  PROTEINUR 100*  NITRITE NEGATIVE  LEUKOCYTESUR TRACE*      Imaging: Dg Chest Port 1 View  Result Date: 09/21/2018 CLINICAL DATA:  Central line placement EXAM: PORTABLE CHEST 1 VIEW COMPARISON:  09/21/2018 FINDINGS: Interval placement of a left neck multi lumen vascular catheter, tip projecting in the vicinity of the  brachiocephalic confluence. Otherwise unchanged low volume AP portable examination with cardiomegaly. No acute appearing airspace disease. IMPRESSION: Interval placement of a left neck multi lumen vascular catheter, tip projecting in the vicinity of the brachiocephalic confluence. Otherwise unchanged low volume AP portable examination with cardiomegaly. No acute appearing airspace disease. Electronically Signed   By: Eddie Candle M.D.   On: 09/21/2018 11:38     Medications:   . sodium chloride Stopped (09/22/18 2221)  . anidulafungin Stopped (09/22/18 1745)  . doxycycline (VIBRAMYCIN) IV Stopped (09/23/18 0023)  . heparin 2,800 Units/hr (09/23/18 0600)  . norepinephrine (LEVOPHED) Adult infusion Stopped (09/20/18 1953)  . piperacillin-tazobactam (ZOSYN)  IV 12.5 mL/hr at 09/23/18 0600  .  sodium bicarbonate (isotonic) infusion in sterile water 50 mL/hr at 09/23/18 0600   . aspirin EC  81 mg Oral Daily  . budesonide (PULMICORT) nebulizer solution  0.5 mg Nebulization BID  . Chlorhexidine Gluconate Cloth  6 each Topical Q0600  . dextrose      . feeding supplement (NEPRO CARB STEADY)  237 mL Oral BID BM  . folic acid  1 mg Intravenous Daily  . hydrocortisone sod succinate (SOLU-CORTEF) inj  50 mg Intravenous Q6H  . insulin aspart  0-20 Units Subcutaneous Q4H  . ipratropium-albuterol  3 mL Nebulization Q6H  . mouth rinse  15 mL Mouth Rinse BID  . multivitamin  1 tablet Oral QHS  . multivitamin with minerals  1 tablet Oral Daily  . thiamine injection  100 mg Intravenous Daily  . tranexamic acid (CYKLOKAPRON) topical -INTRAOP  2,000 mg Topical Once   sodium chloride, acetaminophen **OR** acetaminophen, albuterol, fentaNYL (SUBLIMAZE) injection, heparin, HYDROcodone-acetaminophen, iohexol, LORazepam, menthol-cetylpyridinium **OR** phenol, menthol-cetylpyridinium **OR** phenol, metoCLOPramide **OR** metoCLOPramide (REGLAN) injection, ondansetron **OR** ondansetron (ZOFRAN) IV  Assessment/  Plan:  [name] is a 58 y.o. black male with hyeprtension, COPD, diabetes mellitus type II who underwent bilateral knee replacement surgeries last week. who was admitted to [facility] on 09/12/2018 for sepsis  1. Acute renal failure with metabolic acidosis and volume overload baseline creatinine of 1.07 with normal GFR in 06/12/18.  Likely severe ATN from hypotension and sepsis -  Hold nephrotoxic meds - patient remains delirious, oliguric. Creatinine critically elevated, acidotic -  Dialysis today  2. Septic shock/hypotension  -Recent bilateral knee replacement on Sep 01, 2018 at [facility]  -  knee septic arthritis, patient underwent I&D and polyethylene exchange on 5/31 and Rt knee on 6/1 -Currently being treated with anidulafungin, Doxy and Zosyn - dose adjustment as per pharmacist  3. Hypokalemia - 4 K dialysate - may replace with small doses as needed       LOS: 11 [place] [employer] 6/6/202010:53  AM

## 2018-09-23 NOTE — Progress Notes (Signed)
HD tx end. Tolerated tx without complications.    09/23/18 1245  Vital Signs  Temp 98 F (36.7 C)  Temp Source Oral  Pulse Rate 95  Pulse Rate Source Monitor  Resp 19  BP (!) 137/93  BP Location Left Arm  BP Method Automatic  Patient Position (if appropriate) Lying  Oxygen Therapy  SpO2 92 %  O2 Device Room Air  Pain Assessment  Pain Scale 0-10  Pain Score 0  During Hemodialysis Assessment  Dialysis Fluid Bolus Normal Saline  Bolus Amount (mL) 250 mL  Intra-Hemodialysis Comments Tx completed;Tolerated well (5ml fluid removal)

## 2018-09-23 NOTE — Consult Note (Signed)
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  Patient Measurements: Height: 6\' 4"  (193 cm) Weight: (!) 333 lb 12.4 oz (151.4 kg) IBW/kg (Calculated) : 86.8  Vital Signs: Temp: 97.7 F (36.5 C) (06/06 1400) Temp Source: Oral (06/06 1400) BP: 161/82 (06/06 1400) Pulse Rate: 103 (06/06 1600)  Labs: Recent Labs    09/22/18 0411 09/22/18 0700 09/22/18 1220 09/22/18 1640 09/22/18 2013 09/22/18 2306 09/23/18 0407 09/23/18 0620 09/23/18 1207 09/23/18 1525 09/23/18 1823  HGB 7.8* 7.8*  --   --  7.7*  --  7.7*  --   --   --   --   HCT 24.7* 24.5*  --   --  23.9*  --  23.8*  --   --   --   --   PLT 124*  --   --   --  107*  --  102*  --   --   --   --   APTT  --   --   --   --   --   --   --   --   --  91* 64*  HEPARINUNFRC 0.28*  --  0.17*  --   --  0.41  --  0.47  --   --   --   CREATININE 9.24*  --   --  8.11* 8.89*  --  9.18*  --   --   --   --   CKTOTAL  --   --   --   --   --   --   --   --  100  --   --     Estimated Creatinine Clearance: 13.6 mL/min (A) (by C-G formula based on SCr of 9.18 mg/dL (H)).   Medical History: Past Medical History:  Diagnosis Date  . Allergy   . Anxiety   . Arthritis    knees  . Asthma, mild intermittent, well-controlled   . Chronic obstructive asthma ([place])   . COPD (chronic obstructive pulmonary disease) ([place])   . Depression with anxiety   . Diabetes ([place])   . GERD (gastroesophageal reflux disease)   . HBP (high blood pressure)   . High cholesterol   . Renal cyst, right   . Sleep apnea    CPAP  . Swelling     Medications:  Scheduled:  . aspirin EC  81 mg Oral Daily  . budesonide (PULMICORT) nebulizer solution  0.5 mg Nebulization BID  . Chlorhexidine Gluconate Cloth  6 each Topical Q0600  . dextrose      . feeding supplement (NEPRO CARB STEADY)  237 mL Oral BID BM  . folic acid  1 mg Intravenous Daily  . hydrocortisone sod succinate (SOLU-CORTEF) inj  50 mg Intravenous  Q6H  . insulin aspart  0-20 Units Subcutaneous Q4H  . ipratropium-albuterol  3 mL Nebulization Q6H  . mouth rinse  15 mL Mouth Rinse BID  . multivitamin  1 tablet Oral QHS  . multivitamin with minerals  1 tablet Oral Daily  . sodium chloride flush  3 mL Intravenous Q12H  . thiamine injection  100 mg Intravenous Daily  . tranexamic acid (CYKLOKAPRON) topical -INTRAOP  2,000 mg Topical Once    Assessment: 58 yr male had B/l knee replacements Sep 01, 2018 was admitted with fever, AKI/ Platelet count has been declining since 6/5. He was started on heparin 6/3. Child Pugh score estimated to be 5-6  Baseline aPTT: 35s 09/23/18@1525 :  aPTT 91s 09/23/18@1823 : aPTT 64s    Goal of Therapy:  aPTT 50-90 seconds Monitor platelets by anticoagulation protocol: Yes   Plan:  --Level is currently therapeutic. Will continue with argatroban 0.5 mcg/kg/min --baseline aPTT 35s --check aPTT every 2 hours until two therapeutic levels are obtained --daily CBC  [name], PharmD 09/23/2018,6:54 PM

## 2018-09-23 NOTE — Progress Notes (Signed)
ID Chart reviewed and spoke to his nurse  Pt is getting dialysis (2nd day) He is oriented X1 Fever curve down significantly Patient Vitals for the past 24 hrs:  BP Temp Temp src Pulse Resp SpO2 Weight  09/23/18 1030 (!) 162/93 - - 93 20 95 % -  09/23/18 1015 (!) 158/101 98.2 F (36.8 C) Oral 91 18 94 % -  09/23/18 1000 (!) 153/90 98.2 F (36.8 C) Oral 95 (!) 23 93 % (!) 150.4 kg  09/23/18 0800 (!) 139/115 - - 93 19 100 % -  09/23/18 0752 (!) 156/88 98.7 F (37.1 C) Axillary 91 (!) 23 100 % -  09/23/18 0751 - - - 96 (!) 24 100 % -  09/23/18 0700 (!) 153/99 - - 93 (!) 21 100 % -  09/23/18 0600 (!) 152/96 - - 94 20 92 % -  09/23/18 0500 (!) 151/83 - - 86 (!) 24 99 % -  09/23/18 0400 (!) 177/96 - - (!) 102 18 100 % -  09/23/18 0300 (!) 157/101 - - 94 20 99 % -  09/23/18 0200 (!) 156/97 98.5 F (36.9 C) Axillary 86 (!) 23 99 % -  09/23/18 0100 (!) 159/93 - - (!) 102 19 99 % -  09/23/18 0000 (!) 146/86 - - 91 (!) 21 99 % -  09/22/18 2300 (!) 142/85 - - 94 (!) 23 100 % -  09/22/18 2200 (!) 145/95 - - 92 (!) 21 100 % -  09/22/18 2100 (!) 142/83 - - 91 (!) 21 100 % -  09/22/18 2000 (!) 141/82 98.5 F (36.9 C) Axillary 95 (!) 22 100 % -  09/22/18 1919 - - - 100 (!) 25 100 % -  09/22/18 1900 135/81 - - 99 (!) 22 95 % -  09/22/18 1840 - - - (!) 106 (!) 24 95 % -  09/22/18 1835 - - - (!) 108 (!) 28 (!) 87 % -  09/22/18 1830 139/83 - - (!) 109 (!) 23 94 % -  09/22/18 1815 139/75 - - (!) 109 (!) 29 95 % -  09/22/18 1800 (!) 144/85 - - - (!) 24 - -  09/22/18 1745 139/90 - - (!) 110 (!) 27 93 % -  09/22/18 1700 139/83 - - (!) 111 (!) 28 (!) 88 % -  09/22/18 1600 132/80 99.3 F (37.4 C) Axillary (!) 110 (!) 25 94 % -  09/22/18 1530 137/72 99.2 F (37.3 C) Axillary (!) 109 (!) 29 95 % -  09/22/18 1515 (!) 119/104 - - (!) 107 (!) 26 91 % -  09/22/18 1500 133/85 - - (!) 108 20 95 % -  09/22/18 1445 (!) 141/87 - - (!) 108 18 96 % -  09/22/18 1430 137/85 - - (!) 110 (!) 22 100 % -  09/22/18  1415 139/81 - - (!) 110 20 95 % -  09/22/18 1400 135/90 - - (!) 110 (!) 22 96 % -  09/22/18 1345 (!) 141/81 - - (!) 113 (!) 25 97 % -  09/22/18 1330 138/74 - - (!) 116 20 100 % -  09/22/18 1315 130/72 - - (!) 113 (!) 24 97 % -  09/22/18 1300 131/71 98.3 F (36.8 C) Axillary (!) 116 (!) 28 98 % (!) 151.6 kg  09/22/18 1200 128/86 98.3 F (36.8 C) Axillary (!) 114 (!) 24 98 % -  09/22/18 1100 136/84 - - (!) 110 (!) 27 98 % -    CBC  Latest Ref Rng & Units 09/23/2018 09/22/2018 09/22/2018  WBC 4.0 - 10.5 K/uL 9.9 9.5 -  Hemoglobin 13.0 - 17.0 g/dL 7.7(L) 7.7(L) 7.8(L)  Hematocrit 39.0 - 52.0 % 23.8(L) 23.9(L) 24.5(L)  Platelets 150 - 400 K/uL 102(L) 107(L) -    Platelet count  CMP Latest Ref Rng & Units 09/23/2018 09/22/2018 09/22/2018  Glucose 70 - 99 mg/dL 104(H) 222(H) 258(H)  BUN 6 - 20 mg/dL 60(H) 55(H) 54(H)  Creatinine 0.61 - 1.24 mg/dL 9.18(H) 8.89(H) 8.11(H)  Sodium 135 - 145 mmol/L 144 142 142  Potassium 3.5 - 5.1 mmol/L 3.2(L) 3.2(L) 3.1(L)  Chloride 98 - 111 mmol/L 109 107 107  CO2 22 - 32 mmol/L 23 22 20(L)  Calcium 8.9 - 10.3 mg/dL 8.1(L) 7.9(L) 8.0(L)  Total Protein 6.5 - 8.1 g/dL 6.4(L) - -  Total Bilirubin 0.3 - 1.2 mg/dL 0.5 - -  Alkaline Phos 38 - 126 U/L 59 - -  AST 15 - 41 U/L 48(H) - -  ALT 0 - 44 U/L 28 - -     58 yr male had B/l knee replacements Sep 01, 2018 was admitted with fever, AKI . He was taking bactrim and keflex when he came to the Ed on 09/12/18  Initially feverswere persisting inspite of IV vanco and cefepime and as there was no other source (Blood culture neg, urine culture neg, no pneumonia, ) the left knee was aspirated and it had 8000 wbc and he was taken for a wash out on 09/17/18. Rt knee was washed out on 09/18/18 All cultures neg Pts fever was down for 36-48 hrs and started up again on 09/19/18 at 103. Central line, femoral line were removed Cefepime was changed to zosyn on 6/2  Also his creatinine which on admission was 3.71, then 4.3 was down  to 2.46 on 5/31 and then started climbing up again and today is 5.98. Vanco was discontinued on 09/19/18 ( when it was 3.42)  Fever- downward trend for 4 days and afebrile for the past 36 hours  PJI of the left knee - and he had wash out and cultures neg No UTI, no bacteremia, no pneumonia Suspected Candidemia--added anidulafungin 6/3  and doxy 6/3-and fever has resolved - he also was started on stress dose steroids on the same day and was also diagnosed with left popliteal DVT on 09/20/18 and started on heparin- So not sure which impacted the fever. As  fungal blood culture neg so far but not finalized yet  and also beta d glucan negative - less likely candida- may stop anidulafungin once the blood culture finalizes   Encephalopathy? DT? Drug reaction like NMS/serotonin syndrome, compounded by worsening renal function Doubt he has any CNS infection as he was alert and appropriate until Saturday  High cpk around 4500 initially thought to be due to fall Declined to 717 and today increased to > 900.   Hypotension - resolved septic shock VS other cause- after adding stress dose steroids BP normalized off pressor  AKI- started dialysis on 09/22/18  Thrombocytopenia  Platelet count Declining since 09/22/18- He was started on heparin 6/3 , zosyn on 6/2 R/o HIT   Discussed the management with care team

## 2018-09-23 NOTE — Progress Notes (Signed)
Subjective:  Patient seen in the ICU.  Patient admitted with sepsis following bilateral total TKAs and subsequent I&Ds.   Hospital course complicated by severe encephalopathy from DTs in setting of history of EtOH abuse per medical record.  Patient has just received hemodialysis.  HD tech in the room.  Patient is awake sitting up in bed.  Still seems confused.  He says that he is not having significant knee pain currently but states he has not been up on them.  He indicates that he would like to go home.  Objective:   VITALS:   Vitals:   09/23/18 1215 09/23/18 1230 09/23/18 1245 09/23/18 1300  BP: (!) 146/88 (!) 147/93 (!) 137/93 (!) 144/108  Pulse: 95 100 95 (!) 109  Resp: 20 16 19 20   Temp:   98 F (36.7 C)   TempSrc:   Oral   SpO2: 95% 94% 92% 95%  Weight:    (!) 151.4 kg  Height:        PHYSICAL EXAM: Right knee dressing is clean, dry and intact was changed recently.  Honeycomb dressing in place.  No drainage.   Left knee has Aquacel dressing in place overwrapped with an Ace wrap.  Aquacel has mild drainage but there is no evidence for active drainage.  A Polar Care is on the left knee.  Patient has bilateral lower extremity edema.  His compartments in both legs are soft and compressible in both lower extremities.  Patient has palpable pedal pulses.  He has spontaneous movements to the toes and ankles in both lower extremities.    LABS  Results for orders placed or performed during the hospital encounter of 09/12/18 (from the past 24 hour(s))  Glucose, capillary     Status: Abnormal   Collection Time: 09/22/18  2:07 PM  Result Value Ref Range   Glucose-Capillary 311 (H) 70 - 99 mg/dL  Glucose, capillary     Status: Abnormal   Collection Time: 09/22/18  3:08 PM  Result Value Ref Range   Glucose-Capillary 253 (H) 70 - 99 mg/dL  Glucose, capillary     Status: Abnormal   Collection Time: 09/22/18  4:02 PM  Result Value Ref Range   Glucose-Capillary 261 (H) 70 - 99 mg/dL  Basic  metabolic panel     Status: Abnormal   Collection Time: 09/22/18  4:40 PM  Result Value Ref Range   Sodium 142 135 - 145 mmol/L   Potassium 3.1 (L) 3.5 - 5.1 mmol/L   Chloride 107 98 - 111 mmol/L   CO2 20 (L) 22 - 32 mmol/L   Glucose, Bld 258 (H) 70 - 99 mg/dL   BUN 54 (H) 6 - 20 mg/dL   Creatinine, Ser 8.11 (H) 0.61 - 1.24 mg/dL   Calcium 8.0 (L) 8.9 - 10.3 mg/dL   GFR calc non Af Amer 6 (L) >60 mL/min   GFR calc Af Amer 8 (L) >60 mL/min   Anion gap 15 5 - 15  Glucose, capillary     Status: Abnormal   Collection Time: 09/22/18  5:09 PM  Result Value Ref Range   Glucose-Capillary 240 (H) 70 - 99 mg/dL  Glucose, capillary     Status: Abnormal   Collection Time: 09/22/18  6:05 PM  Result Value Ref Range   Glucose-Capillary 222 (H) 70 - 99 mg/dL  Glucose, capillary     Status: Abnormal   Collection Time: 09/22/18  7:03 PM  Result Value Ref Range   Glucose-Capillary 230 (H)  70 - 99 mg/dL  Glucose, capillary     Status: Abnormal   Collection Time: 09/22/18  7:59 PM  Result Value Ref Range   Glucose-Capillary 200 (H) 70 - 99 mg/dL  Basic metabolic panel     Status: Abnormal   Collection Time: 09/22/18  8:13 PM  Result Value Ref Range   Sodium 142 135 - 145 mmol/L   Potassium 3.2 (L) 3.5 - 5.1 mmol/L   Chloride 107 98 - 111 mmol/L   CO2 22 22 - 32 mmol/L   Glucose, Bld 222 (H) 70 - 99 mg/dL   BUN 55 (H) 6 - 20 mg/dL   Creatinine, Ser 8.89 (H) 0.61 - 1.24 mg/dL   Calcium 7.9 (L) 8.9 - 10.3 mg/dL   GFR calc non Af Amer 6 (L) >60 mL/min   GFR calc Af Amer 7 (L) >60 mL/min   Anion gap 13 5 - 15  Magnesium     Status: None   Collection Time: 09/22/18  8:13 PM  Result Value Ref Range   Magnesium 2.2 1.7 - 2.4 mg/dL  Phosphorus     Status: None   Collection Time: 09/22/18  8:13 PM  Result Value Ref Range   Phosphorus 4.0 2.5 - 4.6 mg/dL  CBC     Status: Abnormal   Collection Time: 09/22/18  8:13 PM  Result Value Ref Range   WBC 9.5 4.0 - 10.5 K/uL   RBC 2.87 (L) 4.22 - 5.81  MIL/uL   Hemoglobin 7.7 (L) 13.0 - 17.0 g/dL   HCT 23.9 (L) 39.0 - 52.0 %   MCV 83.3 80.0 - 100.0 fL   MCH 26.8 26.0 - 34.0 pg   MCHC 32.2 30.0 - 36.0 g/dL   RDW 15.5 11.5 - 15.5 %   Platelets 107 (L) 150 - 400 K/uL   nRBC 0.0 0.0 - 0.2 %  Ammonia     Status: None   Collection Time: 09/22/18  8:14 PM  Result Value Ref Range   Ammonia 10 9 - 35 umol/L  Blood gas, arterial     Status: Abnormal   Collection Time: 09/22/18  8:21 PM  Result Value Ref Range   FIO2 0.40    Delivery systems BILEVEL POSITIVE AIRWAY PRESSURE    Inspiratory PAP 10.0    Expiratory PAP 5.0    pH, Arterial 7.47 (H) 7.350 - 7.450   pCO2 arterial 31 (L) 32.0 - 48.0 mmHg   pO2, Arterial 202 (H) 83.0 - 108.0 mmHg   Bicarbonate 22.6 20.0 - 28.0 mmol/L   Acid-base deficit 0.2 0.0 - 2.0 mmol/L   O2 Saturation 99.8 %   Patient temperature 37.0    Collection site RIGHT RADIAL    Sample type ARTERIAL DRAW    Allens test (pass/fail) PASS PASS   Mechanical Rate 12   Glucose, capillary     Status: Abnormal   Collection Time: 09/22/18  8:56 PM  Result Value Ref Range   Glucose-Capillary 194 (H) 70 - 99 mg/dL   Comment 1 Notify RN   Glucose, capillary     Status: Abnormal   Collection Time: 09/22/18  9:51 PM  Result Value Ref Range   Glucose-Capillary 196 (H) 70 - 99 mg/dL  Glucose, capillary     Status: Abnormal   Collection Time: 09/22/18 10:56 PM  Result Value Ref Range   Glucose-Capillary 184 (H) 70 - 99 mg/dL   Comment 1 Notify RN   Heparin level (unfractionated)     Status:  None   Collection Time: 09/22/18 11:06 PM  Result Value Ref Range   Heparin Unfractionated 0.41 0.30 - 0.70 IU/mL  Glucose, capillary     Status: Abnormal   Collection Time: 09/22/18 11:54 PM  Result Value Ref Range   Glucose-Capillary 168 (H) 70 - 99 mg/dL  Glucose, capillary     Status: Abnormal   Collection Time: 09/23/18 12:53 AM  Result Value Ref Range   Glucose-Capillary 165 (H) 70 - 99 mg/dL  Glucose, capillary     Status:  Abnormal   Collection Time: 09/23/18  2:12 AM  Result Value Ref Range   Glucose-Capillary 137 (H) 70 - 99 mg/dL   Comment 1 Notify RN   Glucose, capillary     Status: Abnormal   Collection Time: 09/23/18  3:06 AM  Result Value Ref Range   Glucose-Capillary 117 (H) 70 - 99 mg/dL   Comment 1 Notify RN   Glucose, capillary     Status: None   Collection Time: 09/23/18  3:58 AM  Result Value Ref Range   Glucose-Capillary 99 70 - 99 mg/dL   Comment 1 Notify RN   CBC with Differential/Platelet     Status: Abnormal   Collection Time: 09/23/18  4:07 AM  Result Value Ref Range   WBC 9.9 4.0 - 10.5 K/uL   RBC 2.89 (L) 4.22 - 5.81 MIL/uL   Hemoglobin 7.7 (L) 13.0 - 17.0 g/dL   HCT 23.8 (L) 39.0 - 52.0 %   MCV 82.4 80.0 - 100.0 fL   MCH 26.6 26.0 - 34.0 pg   MCHC 32.4 30.0 - 36.0 g/dL   RDW 15.6 (H) 11.5 - 15.5 %   Platelets 102 (L) 150 - 400 K/uL   nRBC 0.0 0.0 - 0.2 %   Neutrophils Relative % 75 %   Neutro Abs 7.5 1.7 - 7.7 K/uL   Lymphocytes Relative 11 %   Lymphs Abs 1.1 0.7 - 4.0 K/uL   Monocytes Relative 12 %   Monocytes Absolute 1.2 (H) 0.1 - 1.0 K/uL   Eosinophils Relative 0 %   Eosinophils Absolute 0.0 0.0 - 0.5 K/uL   Basophils Relative 0 %   Basophils Absolute 0.0 0.0 - 0.1 K/uL   Immature Granulocytes 2 %   Abs Immature Granulocytes 0.16 (H) 0.00 - 0.07 K/uL  Comprehensive metabolic panel     Status: Abnormal   Collection Time: 09/23/18  4:07 AM  Result Value Ref Range   Sodium 144 135 - 145 mmol/L   Potassium 3.2 (L) 3.5 - 5.1 mmol/L   Chloride 109 98 - 111 mmol/L   CO2 23 22 - 32 mmol/L   Glucose, Bld 104 (H) 70 - 99 mg/dL   BUN 60 (H) 6 - 20 mg/dL   Creatinine, Ser 9.18 (H) 0.61 - 1.24 mg/dL   Calcium 8.1 (L) 8.9 - 10.3 mg/dL   Total Protein 6.4 (L) 6.5 - 8.1 g/dL   Albumin 1.9 (L) 3.5 - 5.0 g/dL   AST 48 (H) 15 - 41 U/L   ALT 28 0 - 44 U/L   Alkaline Phosphatase 59 38 - 126 U/L   Total Bilirubin 0.5 0.3 - 1.2 mg/dL   GFR calc non Af Amer 6 (L) >60 mL/min    GFR calc Af Amer 6 (L) >60 mL/min   Anion gap 12 5 - 15  Magnesium     Status: None   Collection Time: 09/23/18  4:07 AM  Result Value Ref Range   Magnesium  2.2 1.7 - 2.4 mg/dL  Phosphorus     Status: None   Collection Time: 09/23/18  4:07 AM  Result Value Ref Range   Phosphorus 4.1 2.5 - 4.6 mg/dL  Glucose, capillary     Status: None   Collection Time: 09/23/18  5:00 AM  Result Value Ref Range   Glucose-Capillary 87 70 - 99 mg/dL   Comment 1 Notify RN   Glucose, capillary     Status: None   Collection Time: 09/23/18  6:10 AM  Result Value Ref Range   Glucose-Capillary 73 70 - 99 mg/dL   Comment 1 Notify RN   Heparin level (unfractionated)     Status: None   Collection Time: 09/23/18  6:20 AM  Result Value Ref Range   Heparin Unfractionated 0.47 0.30 - 0.70 IU/mL  Glucose, capillary     Status: Abnormal   Collection Time: 09/23/18  6:42 AM  Result Value Ref Range   Glucose-Capillary 64 (L) 70 - 99 mg/dL  Glucose, capillary     Status: None   Collection Time: 09/23/18  6:59 AM  Result Value Ref Range   Glucose-Capillary 93 70 - 99 mg/dL  Glucose, capillary     Status: None   Collection Time: 09/23/18  7:50 AM  Result Value Ref Range   Glucose-Capillary 80 70 - 99 mg/dL  CK     Status: None   Collection Time: 09/23/18 12:07 PM  Result Value Ref Range   Total CK 100 49 - 397 U/L  Glucose, capillary     Status: None   Collection Time: 09/23/18 12:22 PM  Result Value Ref Range   Glucose-Capillary 79 70 - 99 mg/dL    No results found.  Assessment/Plan: 5 Days Post-Op   Active Problems:   Sepsis ([place])   Acute renal failure ([place])   Septic shock ([facility])  According to Dr. Mortimer Fries the intensivist, patient's condition is still guarded.  According to the nephrologist (Dr. Candiss Norse) note, patient's creatinine is elevated and urine output remains low.  Dr. Steva Ready, from ID notes the patient's fever curves are down significantly.  Cultures being followed.  Appreciate care from  medical specialists.  Continue IV antibiotics per ID.      Thornton Park , MD 09/23/2018, 1:45 PM

## 2018-09-23 NOTE — Progress Notes (Signed)
PRE-HD  09/23/18 1000  Hand-Off documentation  Report received from (Full Name) Britt Boozer  Vital Signs  Temp 98.2 F (36.8 C)  Temp Source Oral  Pulse Rate 95  Pulse Rate Source Monitor  Resp (!) 23  BP (!) 153/90  BP Location Left Arm  BP Method Automatic  Patient Position (if appropriate) Lying  Oxygen Therapy  SpO2 93 %  O2 Device Room Air  Pain Assessment  Pain Scale 0-10  Pain Score 0  Dialysis Weight  Weight (!) 150.4 kg  Type of Weight Pre-Dialysis  Time-Out for Hemodialysis  What Procedure? hd  Pt Identifiers(min of two) First/Last Name;MRN/Account#  Correct Site? Yes  Correct Side? Yes  Correct Procedure? Yes  Consents Verified? Yes  Rad Studies Available? N/A  Safety Precautions Reviewed? Yes  Engineer, civil (consulting) Number [id number]  Station Number  [id number])  UF/Alarm Test Passed  Conductivity: Meter 14.2 (tcd 14)  Conductivity: Machine  114.2  pH 7.3  Reverse Osmosis 4  Normal Saline Lot Number [id number]  Dialyzer Lot Number [id number]  Disposable Set Lot Number [id number]  Machine Temperature 98.6 F (37 C)  Musician and Audible Yes  Blood Lines Intact and Secured Yes  Pre Treatment Patient Checks  Vascular access used during treatment Catheter  Hepatitis B Surface Antigen Results Negative  Date Hepatitis B Surface Antigen Drawn 09/21/18  Hepatitis B Surface Antibody  (<10)  Date Hepatitis B Surface Antibody Drawn 09/21/18  Hemodialysis Consent Verified Yes  Hemodialysis Standing Orders Initiated Yes  ECG (Telemetry) Monitor On Yes  Prime Ordered Normal Saline  Length of  DialysisTreatment -hour(s) 2.5 Hour(s)  Dialyzer  (15h)  Dialysate 4K  Variable Sodium  (140)  Dialysis Anticoagulant None  Dialysate Flow Ordered 500  Blood Flow Rate Ordered 250 mL/min  Ultrafiltration Goal 0 Liters  Dialysis Blood Pressure Support Ordered Normal Saline  Education / Care Plan  Dialysis Education Provided Yes  Documented Education in Care Plan Yes   Hemodialysis Catheter Left Internal jugular Triple lumen Temporary  Placement Date/Time: 09/21/18 1100   Placed prior to admission: No  Time Out: Correct patient;Correct procedure;Correct site  Maximum sterile barrier precautions: Hand hygiene;Sterile gown;Cap;Sterile gloves;Large sterile sheet;Mask  Site Prep: Chlorh...  Site Condition No complications  Blue Lumen Status Flushed;Blood return noted  Red Lumen Status Flushed;No blood return (pressure has to be applied to neck )  Dressing Type Biopatch;Occlusive  Dressing Status Clean;Dry;Intact  Interventions New dressing  Drainage Description None  Dressing Change Due 09/30/18

## 2018-09-23 NOTE — Progress Notes (Signed)
HD START   09/23/18 1015  Vital Signs  Temp 98.2 F (36.8 C)  Temp Source Oral  Pulse Rate 91  Pulse Rate Source Monitor  Resp 18  BP (!) 158/101  BP Location Left Arm  BP Method Automatic  Patient Position (if appropriate) Lying  Oxygen Therapy  SpO2 94 %  O2 Device Room Air  During Hemodialysis Assessment  Blood Flow Rate (mL/min) 200 mL/min  Arterial Pressure (mmHg) -80 mmHg  Venous Pressure (mmHg) 120 mmHg  Transmembrane Pressure (mmHg) 50 mmHg  Ultrafiltration Rate (mL/min) 0 mL/min  Dialysate Flow Rate (mL/min) 500 ml/min  Conductivity: Machine  14.2  HD Safety Checks Performed Yes  Dialysis Fluid Bolus Normal Saline  Bolus Amount (mL) 250 mL  Intra-Hemodialysis Comments Tx initiated ([facility] care per policy. Dr Candiss Norse Rounding )

## 2018-09-23 NOTE — Progress Notes (Signed)
CRITICAL CARE NOTE  CC  follow up septic shock  SUBJECTIVE Patient remains critically ill Prognosis is guarded On ciwa S/p HD creatinine worsening heparin drip for DVT    BP (!) 152/96   Pulse 94   Temp 98.5 F (36.9 C) (Axillary)   Resp 20   Ht 6\' 4"  (1.93 m)   Wt (!) 151.6 kg   SpO2 92%   BMI 40.68 kg/m    I/O last 3 completed shifts: In: 4476.9 [I.V.:3171.6; IV Piggyback:1305.3] Out: 1247 [Urine:425; Other:322; Stool:500] No intake/output data recorded.  SpO2: 92 % O2 Flow Rate (L/min): 6 L/min FiO2 (%): 30 %    REVIEW OF SYSTEMS  PATIENT IS UNABLE TO PROVIDE COMPLETE REVIEW OF SYSTEMS DUE TO SEVERE CRITICAL ILLNESS   PHYSICAL EXAMINATION:  GENERAL:critically ill appearing,  HEAD: Normocephalic, atraumatic.  EYES: Pupils equal, round, reactive to light.  No scleral icterus.  MOUTH: Moist mucosal membrane. NECK: Supple. No thyromegaly. No nodules. No JVD.  PULMONARY: +rhonchi, +wheezing CARDIOVASCULAR: S1 and S2. Regular rate and rhythm. No murmurs, rubs, or gallops.  GASTROINTESTINAL: Soft, nontender, -distended. No masses. Positive bowel sounds. No hepatosplenomegaly.  MUSCULOSKELETAL: No swelling, clubbing, or edema.  NEUROLOGIC: obtunded SKIN:intact,warm,dry  MEDICATIONS: I have reviewed all medications and confirmed regimen as documented   CULTURE RESULTS   Recent Results (from the past 240 hour(s))  C difficile quick scan w PCR reflex     Status: None   Collection Time: 09/14/18  2:21 PM  Result Value Ref Range Status   C Diff antigen NEGATIVE NEGATIVE Final   C Diff toxin NEGATIVE NEGATIVE Final   C Diff interpretation No C. difficile detected.  Final    Comment: Performed at [facility], [address] [place], [place] [address]  Group A Strep by PCR     Status: None   Collection Time: 09/15/18  1:53 PM  Result Value Ref Range Status   Group A Strep by PCR NOT DETECTED NOT DETECTED Final    Comment: Performed at [facility], [address], [place], [place] [address]  Body fluid culture     Status: None   Collection Time: 09/15/18  5:13 PM  Result Value Ref Range Status   Specimen Description   Final    KNEE Performed at [facility], [address], [place], [place] [address]    Special Requests   Final    NONE Performed at [facility], [address], [place], [place] [address]    Gram Stain   Final    RARE RBCS FEW WBC SEEN NO ORGANISMS SEEN Performed at [facility], [address], [place], [place] [address]    Culture   Final    NO GROWTH 3 DAYS Performed at [facility], [place] [address] [place], [place] [address]    Report Status 09/19/2018 FINAL  Final  Body fluid culture     Status: None   Collection Time: 09/16/18  8:55 AM  Result Value Ref Range Status   Specimen Description KNEE RIGHT  Final   Special Requests NONE  Final   Gram Stain   Final    RARE WBC PRESENT, PREDOMINANTLY PMN NO ORGANISMS SEEN    Culture   Final    NO GROWTH 3 DAYS Performed at [facility], [place] [address], [place], [place] [address]    Report Status 09/20/2018 FINAL  Final  Aerobic/Anaerobic Culture (surgical/deep wound)     Status: None  Collection Time: 09/17/18 11:32 AM  Result Value Ref Range Status   Specimen Description   Final    TISSUE LEFT KNEE 1 Performed at [facility], [address] [place], [place] [address]    Special Requests PATIENT ON FOLLOWING MAXIPIME VANCOMYCIN  Final   Gram Stain   Final    FEW WBC PRESENT,BOTH PMN AND MONONUCLEAR NO ORGANISMS SEEN    Culture   Final    No growth aerobically or anaerobically. Performed at [facility], [place] [address] [place], [place] [address]    Report Status 09/22/2018 FINAL  Final  Acid Fast Smear (AFB)     Status: None   Collection Time: 09/17/18 11:32 AM  Result Value Ref Range Status   AFB Specimen Processing Comment  Final    Comment: Tissue Grinding and  Digestion/Decontamination   Acid Fast Smear Negative  Final    Comment: (NOTE) Performed At: [facility] [place], [place] [id number] Rush Farmer MD PO:[id number]    Source (AFB) TISSUE  Final    Comment: LEFT KNEE Performed at [facility], [place] [address], [place], [place] [address]   Culture, fungus without smear     Status: None (Preliminary result)   Collection Time: 09/17/18 11:34 AM  Result Value Ref Range Status   Specimen Description   Final    SYNOVIAL Performed at [facility], [address], [place], [place] [address]    Special Requests   Final    NONE Performed at [facility], [address] [place], [place] [address]    Culture   Final    NO FUNGUS ISOLATED AFTER 2 DAYS Performed at [facility], [place] [address] [place], [place] [address]    Report Status PENDING  Incomplete  Acid Fast Culture with reflexed sensitivities     Status: None   Collection Time: 09/17/18 11:36 AM  Result Value Ref Range Status   Acid Fast Culture CANC  Final    Comment: (NOTE) Test cancelled at client's request.      Cancelled per Levin Erp 09/21/2018 Performed At: [facility] [place], [place] [id number] Rush Farmer MD JA:[id number]    Source of Sample TISSUE  Final    Comment: RIGHT KNEE Performed at [facility], [place] [address] [place], [place] [address]   Aerobic/Anaerobic Culture (surgical/deep wound)     Status: None   Collection Time: 09/17/18 11:43 AM  Result Value Ref Range Status   Specimen Description   Final    TISSUE LEFT KNEE 2 Performed at [facility], [address] [place], [place] [address]    Special Requests PATIENT ON FOLLOWING MAXIPIME VANCOMYCIN  Final   Gram Stain   Final    FEW WBC PRESENT, PREDOMINANTLY PMN NO ORGANISMS SEEN    Culture   Final    No growth aerobically or anaerobically. Performed at [facility], [place] [address], [place], [place] [address]    Report Status 09/22/2018 FINAL  Final  Aerobic/Anaerobic Culture (surgical/deep wound)     Status: None   Collection Time: 09/17/18 11:44 AM  Result Value Ref Range Status   Specimen Description   Final    TISSUE LEFT KNEE 3 Performed at [facility], [address] [place], [place] [address]    Special Requests PATIENT ON FOLLOWING MAXIPIME VANCOMYCIN  Final   Gram Stain   Final  MODERATE WBC PRESENT, PREDOMINANTLY PMN NO ORGANISMS SEEN    Culture   Final    No growth aerobically or anaerobically. Performed at [facility], [place] [address] [place], [place] [address]    Report Status 09/22/2018 FINAL  Final  Aerobic/Anaerobic Culture (surgical/deep wound)     Status: None (Preliminary result)   Collection Time: 09/18/18  9:43 PM  Result Value Ref Range Status   Specimen Description   Final    SYNOVIAL Performed at [facility], [address], [place], [place] [address]    Special Requests   Final    NONE Performed at [facility], [place] [place], [place] [address]    Gram Stain   Final    RARE WBC PRESENT, PREDOMINANTLY MONONUCLEAR NO ORGANISMS SEEN    Culture   Final    NO GROWTH 3 DAYS NO ANAEROBES ISOLATED; CULTURE IN PROGRESS FOR 5 DAYS Performed at [facility], [place] [address], [place], [place] [address]    Report Status PENDING  Incomplete  Aerobic/Anaerobic Culture (surgical/deep wound)     Status: None (Preliminary result)   Collection Time: 09/18/18  9:44 PM  Result Value Ref Range Status   Specimen Description   Final    SYNOVIAL Performed at [facility], [address] [place], [place] [address]    Special Requests   Final    RIGHT KNEE Performed at [facility], [place] [address] [place], [place] [address]    Gram Stain   Final    FEW WBC PRESENT,BOTH PMN AND MONONUCLEAR NO ORGANISMS SEEN    Culture   Final    NO GROWTH 3 DAYS NO ANAEROBES  ISOLATED; CULTURE IN PROGRESS FOR 5 DAYS Performed at [facility], [place] [address], [place], [place] [address]    Report Status PENDING  Incomplete  Aerobic/Anaerobic Culture (surgical/deep wound)     Status: None (Preliminary result)   Collection Time: 09/18/18  9:45 PM  Result Value Ref Range Status   Specimen Description   Final    SYNOVIAL Performed at [facility], [address], [place], [place] [address]    Special Requests   Final    RIGHT KNEE Performed at [facility], [address] [place], [place] [address]    Gram Stain   Final    FEW WBC PRESENT, PREDOMINANTLY MONONUCLEAR NO ORGANISMS SEEN    Culture   Final    NO GROWTH 3 DAYS NO ANAEROBES ISOLATED; CULTURE IN PROGRESS FOR 5 DAYS Performed at [facility], [place] [address], [place],  [address]    Report Status PENDING  Incomplete  Culture, blood (Routine X 2) w Reflex to ID Panel     Status: None (Preliminary result)   Collection Time: 09/19/18  1:11 PM  Result Value Ref Range Status   Specimen Description BLOOD LEFT ANTECUBITAL  Final   Special Requests   Final    BOTTLES DRAWN AEROBIC AND ANAEROBIC Blood Culture results may not be optimal due to an excessive volume of blood received in culture bottles   Culture   Final    NO GROWTH 3 DAYS Performed at [facility], [address], [place],  [address]    Report Status PENDING  Incomplete  Culture, blood (Routine X 2) w Reflex to ID Panel     Status: None (Preliminary result)   Collection Time: 09/19/18  1:20 PM  Result Value Ref Range  Status   Specimen Description BLOOD BLOOD RIGHT HAND  Final   Special Requests   Final    BOTTLES DRAWN AEROBIC AND ANAEROBIC Blood Culture results may not be optimal due to an excessive volume of blood received in culture bottles   Culture   Final    NO GROWTH 3 DAYS Performed at [facility], [address], [place], [place] [address]    Report Status  PENDING  Incomplete  Urine Culture     Status: None   Collection Time: 09/20/18  3:59 PM  Result Value Ref Range Status   Specimen Description   Final    URINE, RANDOM Performed at [facility], [address], [place], [place] [address]    Special Requests   Final    NONE Performed at [facility], [address], [place], [place] [address]    Culture   Final    NO GROWTH Performed at [facility], [place] [address] [place], [place] [address]    Report Status 09/21/2018 FINAL  Final  C difficile quick scan w PCR reflex     Status: None   Collection Time: 09/21/18  3:27 PM  Result Value Ref Range Status   C Diff antigen NEGATIVE NEGATIVE Final   C Diff toxin NEGATIVE NEGATIVE Final   C Diff interpretation No C. difficile detected.  Final    Comment: Performed at [facility], [address] [place], [place] [address]  Gastrointestinal Panel by PCR , Stool     Status: None   Collection Time: 09/21/18  3:27 PM  Result Value Ref Range Status   Campylobacter species NOT DETECTED NOT DETECTED Final   Plesimonas shigelloides NOT DETECTED NOT DETECTED Final   Salmonella species NOT DETECTED NOT DETECTED Final   Yersinia enterocolitica NOT DETECTED NOT DETECTED Final   Vibrio species NOT DETECTED NOT DETECTED Final   Vibrio cholerae NOT DETECTED NOT DETECTED Final   Enteroaggregative E coli (EAEC) NOT DETECTED NOT DETECTED Final   Enteropathogenic E coli (EPEC) NOT DETECTED NOT DETECTED Final   Enterotoxigenic E coli (ETEC) NOT DETECTED NOT DETECTED Final   Shiga like toxin producing E coli (STEC) NOT DETECTED NOT DETECTED Final   Shigella/Enteroinvasive E coli (EIEC) NOT DETECTED NOT DETECTED Final   Cryptosporidium NOT DETECTED NOT DETECTED Final   Cyclospora cayetanensis NOT DETECTED NOT DETECTED Final   Entamoeba histolytica NOT DETECTED NOT DETECTED Final   Giardia lamblia NOT DETECTED NOT DETECTED Final   Adenovirus F40/41 NOT DETECTED NOT  DETECTED Final   Astrovirus NOT DETECTED NOT DETECTED Final   Norovirus GI/GII NOT DETECTED NOT DETECTED Final   Rotavirus A NOT DETECTED NOT DETECTED Final   Sapovirus (I, II, IV, and V) NOT DETECTED NOT DETECTED Final    Comment: Performed at [facility], [address] [place], [place] [address]          IMAGING    No results found.     Indwelling Urinary Catheter continued, requirement due to   Reason to continue Indwelling Urinary Catheter strict Intake/Output monitoring for hemodynamic instability   Central Line/ continued, requirement due to  Reason to continue [place] of central venous pressure or other hemodynamic parameters and poor IV access        ASSESSMENT AND PLAN SYNOPSIS  Admitted for severe sepsis from infected LEFT knee s/p knee replacements-s/p multiple knee wash outs complicated by severe encephalopathy from DT's with h/o extensive ETOH abuse  and progressive renal failure with metabolic acidosis   INFECTIOUS DISEASE LEFT KNEE SEPTIC ARTHRITIS -continue antibiotics as prescribed -follow up cultures -follow up ID consultation CULTURES NEG   SEVERE ALCOHOL WITHDRAWAL -Therapy with Thiamine and MVI -CIWA Protocol -Precedex as needed -High risk for intubation  -high risk for aspiration     ACUTE KIDNEY INJURY/Renal Failure -follow chem 7 -follow UO -continue Foley Catheter-assess need -Avoid nephrotoxic agents -Recheck creatinine  Needs HD today   CARDIAC ICU monitoring  ID -continue IV abx as prescibed -follow up cultures  GI GI PROPHYLAXIS as indicated  NUTRITIONAL STATUS DIET-->on hold Constipation protocol as indicated  ENDO - will use ICU hypoglycemic\Hyperglycemia protocol if indicated   ELECTROLYTES -follow labs as needed -replace as needed -pharmacy consultation and following   DVT/GI PRX ordered TRANSFUSIONS AS NEEDED MONITOR FSBS ASSESS the need for LABS as  needed   Critical Care Time devoted to patient care services described in this note is 34 minutes.   Overall, patient is critically ill, prognosis is guarded.  Patient with Multiorgan failure and at high risk for cardiac arrest and death.    Corrin Parker, M.D.  [facility]  Medical Director [place] Director [facility] Cardio-Pulmonary Department

## 2018-09-23 NOTE — Consult Note (Signed)
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  Patient Measurements: Height: 6\' 4"  (193 cm) Weight: (!) 333 lb 12.4 oz (151.4 kg) IBW/kg (Calculated) : 86.8  Vital Signs: Temp: 98.2 F (36.8 C) (06/06 2000) Temp Source: Oral (06/06 2000) BP: 129/84 (06/06 2000) Pulse Rate: 103 (06/06 1600)  Labs: Recent Labs    09/22/18 0411 09/22/18 0700 09/22/18 1220 09/22/18 1640 09/22/18 2013 09/22/18 2306 09/23/18 0407 09/23/18 0620 09/23/18 1207 09/23/18 1525 09/23/18 1823 09/23/18 2013  HGB 7.8* 7.8*  --   --  7.7*  --  7.7*  --   --   --   --   --   HCT 24.7* 24.5*  --   --  23.9*  --  23.8*  --   --   --   --   --   PLT 124*  --   --   --  107*  --  102*  --   --   --   --   --   APTT  --   --   --   --   --   --   --   --   --  91* 64* 63*  HEPARINUNFRC 0.28*  --  0.17*  --   --  0.41  --  0.47  --   --   --   --   CREATININE 9.24*  --   --  8.11* 8.89*  --  9.18*  --   --   --   --   --   CKTOTAL  --   --   --   --   --   --   --   --  100  --   --   --     Estimated Creatinine Clearance: 13.6 mL/min (A) (by C-G formula based on SCr of 9.18 mg/dL (H)).   Medical History: Past Medical History:  Diagnosis Date  . Allergy   . Anxiety   . Arthritis    knees  . Asthma, mild intermittent, well-controlled   . Chronic obstructive asthma ([place])   . COPD (chronic obstructive pulmonary disease) ([place])   . Depression with anxiety   . Diabetes ([place])   . GERD (gastroesophageal reflux disease)   . HBP (high blood pressure)   . High cholesterol   . Renal cyst, right   . Sleep apnea    CPAP  . Swelling     Medications:  Scheduled:  . aspirin EC  81 mg Oral Daily  . budesonide (PULMICORT) nebulizer solution  0.5 mg Nebulization BID  . Chlorhexidine Gluconate Cloth  6 each Topical Q0600  . feeding supplement (NEPRO CARB STEADY)  237 mL Oral BID BM  . folic acid  1 mg Intravenous Daily  . hydrocortisone sod succinate  (SOLU-CORTEF) inj  50 mg Intravenous Q6H  . insulin aspart  0-20 Units Subcutaneous Q4H  . ipratropium-albuterol  3 mL Nebulization Q6H  . mouth rinse  15 mL Mouth Rinse BID  . multivitamin  1 tablet Oral QHS  . multivitamin with minerals  1 tablet Oral Daily  . sodium chloride flush  3 mL Intravenous Q12H  . thiamine injection  100 mg Intravenous Daily  . tranexamic acid (CYKLOKAPRON) topical -INTRAOP  2,000 mg Topical Once    Assessment: 58 yr male had B/l knee replacements Sep 01, 2018 was admitted with fever, AKI/ Platelet count has been declining since 6/5. He was started on  heparin 6/3. Child Pugh score estimated to be 5-6  Baseline aPTT: 35s 09/23/18@1525 : aPTT 91s 09/23/18@1823 : aPTT 64s 09/23/18@2013 : aPTT 63s    Goal of Therapy:  aPTT 50-90 seconds Monitor platelets by anticoagulation protocol: Yes   Plan:  --Level is currently therapeutic for second consecutive level. Will continue with argatroban 0.5 mcg/kg/min --baseline aPTT 35s --Will check aPTT with AM labs --daily CBC  [name], PharmD 09/23/2018,9:07 PM

## 2018-09-23 NOTE — Consult Note (Signed)
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  Patient Measurements: Height: 6\' 4"  (193 cm) Weight: (!) 331 lb 9.2 oz (150.4 kg) IBW/kg (Calculated) : 86.8  Vital Signs: Temp: 98.2 F (36.8 C) (06/06 1015) Temp Source: Oral (06/06 1015) BP: 153/92 (06/06 1115) Pulse Rate: 95 (06/06 1130)  Labs: Recent Labs    09/20/18 1811  09/22/18 0411 09/22/18 0700 09/22/18 1220 09/22/18 1640 09/22/18 2013 09/22/18 2306 09/23/18 0407 09/23/18 0620  HGB  --    < > 7.8* 7.8*  --   --  7.7*  --  7.7*  --   HCT  --    < > 24.7* 24.5*  --   --  23.9*  --  23.8*  --   PLT  --    < > 124*  --   --   --  107*  --  102*  --   APTT 35  --   --   --   --   --   --   --   --   --   HEPARINUNFRC <0.10*   < > 0.28*  --  0.17*  --   --  0.41  --  0.47  CREATININE  --    < > 9.24*  --   --  8.11* 8.89*  --  9.18*  --    < > = values in this interval not displayed.    Estimated Creatinine Clearance: 13.6 mL/min (A) (by C-G formula based on SCr of 9.18 mg/dL (H)).   Medical History: Past Medical History:  Diagnosis Date  . Allergy   . Anxiety   . Arthritis    knees  . Asthma, mild intermittent, well-controlled   . Chronic obstructive asthma ([place])   . COPD (chronic obstructive pulmonary disease) ([place])   . Depression with anxiety   . Diabetes ([place])   . GERD (gastroesophageal reflux disease)   . HBP (high blood pressure)   . High cholesterol   . Renal cyst, right   . Sleep apnea    CPAP  . Swelling     Medications:  Scheduled:  . aspirin EC  81 mg Oral Daily  . budesonide (PULMICORT) nebulizer solution  0.5 mg Nebulization BID  . Chlorhexidine Gluconate Cloth  6 each Topical Q0600  . dextrose      . feeding supplement (NEPRO CARB STEADY)  237 mL Oral BID BM  . folic acid  1 mg Intravenous Daily  . hydrocortisone sod succinate (SOLU-CORTEF) inj  50 mg Intravenous Q6H  . insulin aspart  0-20 Units Subcutaneous Q4H  .  ipratropium-albuterol  3 mL Nebulization Q6H  . mouth rinse  15 mL Mouth Rinse BID  . multivitamin  1 tablet Oral QHS  . multivitamin with minerals  1 tablet Oral Daily  . sodium chloride flush  3 mL Intravenous Q12H  . thiamine injection  100 mg Intravenous Daily  . tranexamic acid (CYKLOKAPRON) topical -INTRAOP  2,000 mg Topical Once    Assessment: 58 yr male had B/l knee replacements Sep 01, 2018 was admitted with fever, AKI/ Platelet count has been declining since 6/5. He was started on heparin 6/3. Child Pugh score estimated to be 5-6  Goal of Therapy:  aPTT 50-90 seconds Monitor platelets by anticoagulation protocol: Yes   Plan:  --Start with argatroban 0.5 mcg/kg/min --baseline aPTT 35s --check aPTT every 2 hours until two therapeutic levels are obtained --daily CBC  Dallie Piles,  PharmD 09/23/2018,11:43 AM

## 2018-09-23 NOTE — Progress Notes (Signed)
Assisted tele visit to patient with family Isaac Vanacker RN  

## 2018-09-23 NOTE — Progress Notes (Signed)
Isaac Cross for heparin drip management Indication: DVT  Allergies  Allergen Reactions  . Lipitor [Atorvastatin] Hives and Itching  . Other   . Viagra  [Sildenafil Citrate]     vision loss    Patient Measurements: Height: 6\' 4"  (193 cm) Weight: (!) 334 lb 3.5 oz (151.6 kg) IBW/kg (Calculated) : 86.8 Heparin Dosing Weight: 120 kg  Vital Signs: Temp: 98.7 F (37.1 C) (06/06 0752) Temp Source: Axillary (06/06 0752) BP: 139/115 (06/06 0800) Pulse Rate: 93 (06/06 0800)  Labs: Recent Labs    09/20/18 0948 09/20/18 1811  09/22/18 0411 09/22/18 0700 09/22/18 1220 09/22/18 1640 09/22/18 2013 09/22/18 2306 09/23/18 0407 09/23/18 0620  HGB  --   --    < > 7.8* 7.8*  --   --  7.7*  --  7.7*  --   HCT  --   --    < > 24.7* 24.5*  --   --  23.9*  --  23.8*  --   PLT  --   --    < > 124*  --   --   --  107*  --  102*  --   APTT  --  35  --   --   --   --   --   --   --   --   --   HEPARINUNFRC  --  <0.10*   < > 0.28*  --  0.17*  --   --  0.41  --  0.47  CREATININE  --   --    < > 9.24*  --   --  8.11* 8.89*  --  9.18*  --   CKTOTAL 992*  --   --   --   --   --   --   --   --   --   --    < > = values in this interval not displayed.    Estimated Creatinine Clearance: 13.6 mL/min (A) (by C-G formula based on SCr of 9.18 mg/dL (H)).   Medical History: Past Medical History:  Diagnosis Date  . Allergy   . Anxiety   . Arthritis    knees  . Asthma, mild intermittent, well-controlled   . Chronic obstructive asthma ([place])   . COPD (chronic obstructive pulmonary disease) ([place])   . Depression with anxiety   . Diabetes ([place])   . GERD (gastroesophageal reflux disease)   . HBP (high blood pressure)   . High cholesterol   . Renal cyst, right   . Sleep apnea    CPAP  . Swelling     Medications:  Scheduled:  . aspirin EC  81 mg Oral Daily  . budesonide (PULMICORT) nebulizer solution  0.5 mg Nebulization BID  . Chlorhexidine Gluconate  Cloth  6 each Topical Q0600  . dextrose      . feeding supplement (NEPRO CARB STEADY)  237 mL Oral BID BM  . folic acid  1 mg Intravenous Daily  . hydrocortisone sod succinate (SOLU-CORTEF) inj  50 mg Intravenous Q6H  . insulin aspart  0-20 Units Subcutaneous Q4H  . ipratropium-albuterol  3 mL Nebulization Q4H  . mouth rinse  15 mL Mouth Rinse BID  . multivitamin  1 tablet Oral QHS  . multivitamin with minerals  1 tablet Oral Daily  . thiamine injection  100 mg Intravenous Daily  . tranexamic acid (CYKLOKAPRON) topical -INTRAOP  2,000 mg Topical Once   Infusions:  .  sodium chloride Stopped (09/22/18 2221)  . anidulafungin Stopped (09/22/18 1745)  . doxycycline (VIBRAMYCIN) IV Stopped (09/23/18 0023)  . heparin 2,800 Units/hr (09/23/18 0600)  . norepinephrine (LEVOPHED) Adult infusion Stopped (09/20/18 1953)  . piperacillin-tazobactam (ZOSYN)  IV 12.5 mL/hr at 09/23/18 0600  .  sodium bicarbonate (isotonic) infusion in sterile water 50 mL/hr at 09/23/18 0600    Assessment: Admitted with severe sepsis-infected L knee with h/o extensive ETOH abuse , acute renal failure Pharmacy consulted for heparin drip management for 58 yo male being treated with heparin for left popliteal vein DVT. Patient with bilateral knee replacement on 5/15. Patient admitted to [facility] on 5/26 with weakness and pain. Patient underwent I&D of left knee on 5/31 and right knee on 6/1.  Patient prior to admission was on rivaroxaban 10mg . This was not continued in house and anti-Xa level was <0.1.  -Patient now on Hemodialysis acutely  6/5:  HL @ 2300 = 0.41 Will continue pt on current rate and draw confirmation level in 8 hrs on 6/6 @ 0700.   Goal of Therapy:  Heparin level 0.3-0.7 units/ml Monitor platelets by anticoagulation protocol: Yes   Plan:   6/6 HL@0620 = 0.47. Will continue current Heparin rate of 2800 units/hr. Hgb 7/7  Plt 102.  Will f/u HL with am labs.  Patient is to have serial hematocrit and  hemoglobins as ordered by ICU team.   Pharmacy will continue to monitor and adjust per consult.   Isaac Cross A  09/23/2018,9:20 AM

## 2018-09-23 NOTE — Progress Notes (Signed)
Subjective:  Patient obtunded.  Objective:   VITALS:   Vitals:   09/23/18 0300 09/23/18 0400 09/23/18 0500 09/23/18 0600  BP: (!) 157/101 (!) 177/96 (!) 151/83 (!) 152/96  Pulse: 94 (!) 102 86 94  Resp: 20 18 (!) 24 20  Temp:      TempSrc:      SpO2: 99% 100% 99% 92%  Weight:      Height:        PHYSICAL EXAM:  Incision: dressing C/D/I No cellulitis present Compartment soft  LABS  Results for orders placed or performed during the hospital encounter of 09/12/18 (from the past 24 hour(s))  Glucose, capillary     Status: Abnormal   Collection Time: 09/22/18  7:42 AM  Result Value Ref Range   Glucose-Capillary 317 (H) 70 - 99 mg/dL  Glucose, capillary     Status: Abnormal   Collection Time: 09/22/18  8:35 AM  Result Value Ref Range   Glucose-Capillary 331 (H) 70 - 99 mg/dL  Glucose, capillary     Status: Abnormal   Collection Time: 09/22/18  9:54 AM  Result Value Ref Range   Glucose-Capillary 353 (H) 70 - 99 mg/dL  Glucose, capillary     Status: Abnormal   Collection Time: 09/22/18 11:14 AM  Result Value Ref Range   Glucose-Capillary 338 (H) 70 - 99 mg/dL  Glucose, capillary     Status: Abnormal   Collection Time: 09/22/18 12:02 PM  Result Value Ref Range   Glucose-Capillary 351 (H) 70 - 99 mg/dL  Heparin level (unfractionated)     Status: Abnormal   Collection Time: 09/22/18 12:20 PM  Result Value Ref Range   Heparin Unfractionated 0.17 (L) 0.30 - 0.70 IU/mL  Glucose, capillary     Status: Abnormal   Collection Time: 09/22/18  1:05 PM  Result Value Ref Range   Glucose-Capillary 352 (H) 70 - 99 mg/dL  Glucose, capillary     Status: Abnormal   Collection Time: 09/22/18  2:07 PM  Result Value Ref Range   Glucose-Capillary 311 (H) 70 - 99 mg/dL  Glucose, capillary     Status: Abnormal   Collection Time: 09/22/18  3:08 PM  Result Value Ref Range   Glucose-Capillary 253 (H) 70 - 99 mg/dL  Glucose, capillary     Status: Abnormal   Collection Time: 09/22/18   4:02 PM  Result Value Ref Range   Glucose-Capillary 261 (H) 70 - 99 mg/dL  Basic metabolic panel     Status: Abnormal   Collection Time: 09/22/18  4:40 PM  Result Value Ref Range   Sodium 142 135 - 145 mmol/L   Potassium 3.1 (L) 3.5 - 5.1 mmol/L   Chloride 107 98 - 111 mmol/L   CO2 20 (L) 22 - 32 mmol/L   Glucose, Bld 258 (H) 70 - 99 mg/dL   BUN 54 (H) 6 - 20 mg/dL   Creatinine, Ser 8.11 (H) 0.61 - 1.24 mg/dL   Calcium 8.0 (L) 8.9 - 10.3 mg/dL   GFR calc non Af Amer 6 (L) >60 mL/min   GFR calc Af Amer 8 (L) >60 mL/min   Anion gap 15 5 - 15  Glucose, capillary     Status: Abnormal   Collection Time: 09/22/18  5:09 PM  Result Value Ref Range   Glucose-Capillary 240 (H) 70 - 99 mg/dL  Glucose, capillary     Status: Abnormal   Collection Time: 09/22/18  6:05 PM  Result Value Ref Range   Glucose-Capillary  222 (H) 70 - 99 mg/dL  Glucose, capillary     Status: Abnormal   Collection Time: 09/22/18  7:03 PM  Result Value Ref Range   Glucose-Capillary 230 (H) 70 - 99 mg/dL  Glucose, capillary     Status: Abnormal   Collection Time: 09/22/18  7:59 PM  Result Value Ref Range   Glucose-Capillary 200 (H) 70 - 99 mg/dL  Basic metabolic panel     Status: Abnormal   Collection Time: 09/22/18  8:13 PM  Result Value Ref Range   Sodium 142 135 - 145 mmol/L   Potassium 3.2 (L) 3.5 - 5.1 mmol/L   Chloride 107 98 - 111 mmol/L   CO2 22 22 - 32 mmol/L   Glucose, Bld 222 (H) 70 - 99 mg/dL   BUN 55 (H) 6 - 20 mg/dL   Creatinine, Ser 8.89 (H) 0.61 - 1.24 mg/dL   Calcium 7.9 (L) 8.9 - 10.3 mg/dL   GFR calc non Af Amer 6 (L) >60 mL/min   GFR calc Af Amer 7 (L) >60 mL/min   Anion gap 13 5 - 15  Magnesium     Status: None   Collection Time: 09/22/18  8:13 PM  Result Value Ref Range   Magnesium 2.2 1.7 - 2.4 mg/dL  Phosphorus     Status: None   Collection Time: 09/22/18  8:13 PM  Result Value Ref Range   Phosphorus 4.0 2.5 - 4.6 mg/dL  CBC     Status: Abnormal   Collection Time: 09/22/18  8:13  PM  Result Value Ref Range   WBC 9.5 4.0 - 10.5 K/uL   RBC 2.87 (L) 4.22 - 5.81 MIL/uL   Hemoglobin 7.7 (L) 13.0 - 17.0 g/dL   HCT 23.9 (L) 39.0 - 52.0 %   MCV 83.3 80.0 - 100.0 fL   MCH 26.8 26.0 - 34.0 pg   MCHC 32.2 30.0 - 36.0 g/dL   RDW 15.5 11.5 - 15.5 %   Platelets 107 (L) 150 - 400 K/uL   nRBC 0.0 0.0 - 0.2 %  Ammonia     Status: None   Collection Time: 09/22/18  8:14 PM  Result Value Ref Range   Ammonia 10 9 - 35 umol/L  Blood gas, arterial     Status: Abnormal   Collection Time: 09/22/18  8:21 PM  Result Value Ref Range   FIO2 0.40    Delivery systems BILEVEL POSITIVE AIRWAY PRESSURE    Inspiratory PAP 10.0    Expiratory PAP 5.0    pH, Arterial 7.47 (H) 7.350 - 7.450   pCO2 arterial 31 (L) 32.0 - 48.0 mmHg   pO2, Arterial 202 (H) 83.0 - 108.0 mmHg   Bicarbonate 22.6 20.0 - 28.0 mmol/L   Acid-base deficit 0.2 0.0 - 2.0 mmol/L   O2 Saturation 99.8 %   Patient temperature 37.0    Collection site RIGHT RADIAL    Sample type ARTERIAL DRAW    Allens test (pass/fail) PASS PASS   Mechanical Rate 12   Glucose, capillary     Status: Abnormal   Collection Time: 09/22/18  8:56 PM  Result Value Ref Range   Glucose-Capillary 194 (H) 70 - 99 mg/dL   Comment 1 Notify RN   Glucose, capillary     Status: Abnormal   Collection Time: 09/22/18  9:51 PM  Result Value Ref Range   Glucose-Capillary 196 (H) 70 - 99 mg/dL  Glucose, capillary     Status: Abnormal   Collection Time:  09/22/18 10:56 PM  Result Value Ref Range   Glucose-Capillary 184 (H) 70 - 99 mg/dL   Comment 1 Notify RN   Heparin level (unfractionated)     Status: None   Collection Time: 09/22/18 11:06 PM  Result Value Ref Range   Heparin Unfractionated 0.41 0.30 - 0.70 IU/mL  Glucose, capillary     Status: Abnormal   Collection Time: 09/22/18 11:54 PM  Result Value Ref Range   Glucose-Capillary 168 (H) 70 - 99 mg/dL  Glucose, capillary     Status: Abnormal   Collection Time: 09/23/18 12:53 AM  Result Value  Ref Range   Glucose-Capillary 165 (H) 70 - 99 mg/dL  Glucose, capillary     Status: Abnormal   Collection Time: 09/23/18  2:12 AM  Result Value Ref Range   Glucose-Capillary 137 (H) 70 - 99 mg/dL   Comment 1 Notify RN   Glucose, capillary     Status: Abnormal   Collection Time: 09/23/18  3:06 AM  Result Value Ref Range   Glucose-Capillary 117 (H) 70 - 99 mg/dL   Comment 1 Notify RN   Glucose, capillary     Status: None   Collection Time: 09/23/18  3:58 AM  Result Value Ref Range   Glucose-Capillary 99 70 - 99 mg/dL   Comment 1 Notify RN   CBC with Differential/Platelet     Status: Abnormal   Collection Time: 09/23/18  4:07 AM  Result Value Ref Range   WBC 9.9 4.0 - 10.5 K/uL   RBC 2.89 (L) 4.22 - 5.81 MIL/uL   Hemoglobin 7.7 (L) 13.0 - 17.0 g/dL   HCT 23.8 (L) 39.0 - 52.0 %   MCV 82.4 80.0 - 100.0 fL   MCH 26.6 26.0 - 34.0 pg   MCHC 32.4 30.0 - 36.0 g/dL   RDW 15.6 (H) 11.5 - 15.5 %   Platelets 102 (L) 150 - 400 K/uL   nRBC 0.0 0.0 - 0.2 %   Neutrophils Relative % 75 %   Neutro Abs 7.5 1.7 - 7.7 K/uL   Lymphocytes Relative 11 %   Lymphs Abs 1.1 0.7 - 4.0 K/uL   Monocytes Relative 12 %   Monocytes Absolute 1.2 (H) 0.1 - 1.0 K/uL   Eosinophils Relative 0 %   Eosinophils Absolute 0.0 0.0 - 0.5 K/uL   Basophils Relative 0 %   Basophils Absolute 0.0 0.0 - 0.1 K/uL   Immature Granulocytes 2 %   Abs Immature Granulocytes 0.16 (H) 0.00 - 0.07 K/uL  Comprehensive metabolic panel     Status: Abnormal   Collection Time: 09/23/18  4:07 AM  Result Value Ref Range   Sodium 144 135 - 145 mmol/L   Potassium 3.2 (L) 3.5 - 5.1 mmol/L   Chloride 109 98 - 111 mmol/L   CO2 23 22 - 32 mmol/L   Glucose, Bld 104 (H) 70 - 99 mg/dL   BUN 60 (H) 6 - 20 mg/dL   Creatinine, Ser 9.18 (H) 0.61 - 1.24 mg/dL   Calcium 8.1 (L) 8.9 - 10.3 mg/dL   Total Protein 6.4 (L) 6.5 - 8.1 g/dL   Albumin 1.9 (L) 3.5 - 5.0 g/dL   AST 48 (H) 15 - 41 U/L   ALT 28 0 - 44 U/L   Alkaline Phosphatase 59 38 - 126  U/L   Total Bilirubin 0.5 0.3 - 1.2 mg/dL   GFR calc non Af Amer 6 (L) >60 mL/min   GFR calc Af Amer 6 (L) >60  mL/min   Anion gap 12 5 - 15  Magnesium     Status: None   Collection Time: 09/23/18  4:07 AM  Result Value Ref Range   Magnesium 2.2 1.7 - 2.4 mg/dL  Phosphorus     Status: None   Collection Time: 09/23/18  4:07 AM  Result Value Ref Range   Phosphorus 4.1 2.5 - 4.6 mg/dL  Glucose, capillary     Status: None   Collection Time: 09/23/18  5:00 AM  Result Value Ref Range   Glucose-Capillary 87 70 - 99 mg/dL   Comment 1 Notify RN   Glucose, capillary     Status: None   Collection Time: 09/23/18  6:10 AM  Result Value Ref Range   Glucose-Capillary 73 70 - 99 mg/dL   Comment 1 Notify RN   Heparin level (unfractionated)     Status: None   Collection Time: 09/23/18  6:20 AM  Result Value Ref Range   Heparin Unfractionated 0.47 0.30 - 0.70 IU/mL  Glucose, capillary     Status: Abnormal   Collection Time: 09/23/18  6:42 AM  Result Value Ref Range   Glucose-Capillary 64 (L) 70 - 99 mg/dL  Glucose, capillary     Status: None   Collection Time: 09/23/18  6:59 AM  Result Value Ref Range   Glucose-Capillary 93 70 - 99 mg/dL    Dg Chest Port 1 View  Result Date: 09/21/2018 CLINICAL DATA:  Central line placement EXAM: PORTABLE CHEST 1 VIEW COMPARISON:  09/21/2018 FINDINGS: Interval placement of a left neck multi lumen vascular catheter, tip projecting in the vicinity of the brachiocephalic confluence. Otherwise unchanged low volume AP portable examination with cardiomegaly. No acute appearing airspace disease. IMPRESSION: Interval placement of a left neck multi lumen vascular catheter, tip projecting in the vicinity of the brachiocephalic confluence. Otherwise unchanged low volume AP portable examination with cardiomegaly. No acute appearing airspace disease. Electronically Signed   By: Eddie Candle M.D.   On: 09/21/2018 11:38    Assessment/Plan: 5 Days Post-Op   Active  Problems:   Sepsis ([place])   Acute renal failure ([place])   Septic shock ([place])   The right knee dressing is changed. The incision is healing well without drainage. Xeroform and honeycomb placed. The left knee incision is also healing well without drainage. Will continue to follow closely. Cultures remain negative.   Lovell Sheehan , MD 09/23/2018, 7:16 AM

## 2018-09-24 LAB — CULTURE, BLOOD (ROUTINE X 2)
Culture: NO GROWTH
Culture: NO GROWTH

## 2018-09-24 LAB — GLUCOSE, CAPILLARY
Glucose-Capillary: 171 mg/dL — ABNORMAL HIGH (ref 70–99)
Glucose-Capillary: 133 mg/dL — ABNORMAL HIGH (ref 70–99)
Glucose-Capillary: 160 mg/dL — ABNORMAL HIGH (ref 70–99)
Glucose-Capillary: 152 mg/dL — ABNORMAL HIGH (ref 70–99)
Glucose-Capillary: 247 mg/dL — ABNORMAL HIGH (ref 70–99)
Glucose-Capillary: 147 mg/dL — ABNORMAL HIGH (ref 70–99)

## 2018-09-24 LAB — PREPARE RBC (CROSSMATCH)

## 2018-09-24 LAB — APTT: aPTT: 60 seconds — ABNORMAL HIGH (ref 24–36)

## 2018-09-24 LAB — CBC
HCT: 22.3 % — ABNORMAL LOW (ref 39.0–52.0)
Hemoglobin: 7 g/dL — ABNORMAL LOW (ref 13.0–17.0)
MCH: 26.4 pg (ref 26.0–34.0)
MCHC: 31.4 g/dL (ref 30.0–36.0)
MCV: 84.2 fL (ref 80.0–100.0)
Platelets: 99 10*3/uL — ABNORMAL LOW (ref 150–400)
RBC: 2.65 MIL/uL — ABNORMAL LOW (ref 4.22–5.81)
RDW: 15.4 % (ref 11.5–15.5)
WBC: 8.6 10*3/uL (ref 4.0–10.5)
nRBC: 0 % (ref 0.0–0.2)

## 2018-09-24 LAB — RENAL FUNCTION PANEL
Albumin: 1.9 g/dL — ABNORMAL LOW (ref 3.5–5.0)
Anion gap: 11 (ref 5–15)
BUN: 56 mg/dL — ABNORMAL HIGH (ref 6–20)
CO2: 28 mmol/L (ref 22–32)
Calcium: 7.6 mg/dL — ABNORMAL LOW (ref 8.9–10.3)
Chloride: 105 mmol/L (ref 98–111)
Creatinine, Ser: 8.3 mg/dL — ABNORMAL HIGH (ref 0.61–1.24)
GFR calc Af Amer: 7 mL/min — ABNORMAL LOW (ref >60)
GFR calc non Af Amer: 6 mL/min — ABNORMAL LOW (ref >60)
Glucose, Bld: 158 mg/dL — ABNORMAL HIGH (ref 70–99)
Phosphorus: 3.7 mg/dL (ref 2.5–4.6)
Potassium: 3.1 mmol/L — ABNORMAL LOW (ref 3.5–5.1)
Sodium: 144 mmol/L (ref 135–145)

## 2018-09-24 LAB — AEROBIC/ANAEROBIC CULTURE W GRAM STAIN (SURGICAL/DEEP WOUND)
Culture: NO GROWTH
Culture: NO GROWTH
Culture: NO GROWTH

## 2018-09-24 MED ORDER — SODIUM CHLORIDE 0.9% IV SOLUTION: Freq: Once | INTRAVENOUS | Status: DC

## 2018-09-24 MED ORDER — POTASSIUM CHLORIDE 10 MEQ/50ML IV SOLN
10.0000 meq | INTRAVENOUS | Status: AC
  Administered 2018-09-24 (×3): 10 meq via INTRAVENOUS
  Filled 2018-09-24 (×3): qty 50

## 2018-09-24 NOTE — Progress Notes (Signed)
HD Tx End    09/24/18 1915  Vital Signs  Pulse Rate 86  Resp 18  BP (!) 168/87  Oxygen Therapy  SpO2 95 %  During Hemodialysis Assessment  Blood Flow Rate (mL/min) 200 mL/min  Arterial Pressure (mmHg) -120 mmHg  Venous Pressure (mmHg) 150 mmHg  Transmembrane Pressure (mmHg) 60 mmHg  Ultrafiltration Rate (mL/min) 190 mL/min  Dialysate Flow Rate (mL/min) 600 ml/min  Conductivity: Machine  13.8  HD Safety Checks Performed Yes  Intra-Hemodialysis Comments Tx completed;Tolerated well

## 2018-09-24 NOTE — Consult Note (Addendum)
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  Patient Measurements: Height: 6\' 4"  (193 cm) Weight: (!) 333 lb 12.4 oz (151.4 kg) IBW/kg (Calculated) : 86.8  Vital Signs: Temp: 97.9 F (36.6 C) (06/07 0200) Temp Source: Oral (06/07 0200) BP: 136/79 (06/07 0300) Pulse Rate: 82 (06/07 0300)  Labs: Recent Labs    09/22/18 1220  09/22/18 2013 09/22/18 2306 09/23/18 0407 09/23/18 0620 09/23/18 1207  09/23/18 1823 09/23/18 2013 09/24/18 0327  HGB  --   --  7.7*  --  7.7*  --   --   --   --   --  7.0*  HCT  --   --  23.9*  --  23.8*  --   --   --   --   --  22.3*  PLT  --   --  107*  --  102*  --   --   --   --   --  99*  APTT  --   --   --   --   --   --   --    < > 64* 63* 60*  HEPARINUNFRC 0.17*  --   --  0.41  --  0.47  --   --   --   --   --   CREATININE  --    < > 8.89*  --  9.18*  --   --   --   --   --  8.30*  CKTOTAL  --   --   --   --   --   --  100  --   --   --   --    < > = values in this interval not displayed.    Estimated Creatinine Clearance: 15.1 mL/min (A) (by C-G formula based on SCr of 8.3 mg/dL (H)).   Medical History: Past Medical History:  Diagnosis Date  . Allergy   . Anxiety   . Arthritis    knees  . Asthma, mild intermittent, well-controlled   . Chronic obstructive asthma ([place])   . COPD (chronic obstructive pulmonary disease) ([place])   . Depression with anxiety   . Diabetes ([place])   . GERD (gastroesophageal reflux disease)   . HBP (high blood pressure)   . High cholesterol   . Renal cyst, right   . Sleep apnea    CPAP  . Swelling     Medications:  Scheduled:  . aspirin EC  81 mg Oral Daily  . budesonide (PULMICORT) nebulizer solution  0.5 mg Nebulization BID  . Chlorhexidine Gluconate Cloth  6 each Topical Q0600  . feeding supplement (NEPRO CARB STEADY)  237 mL Oral BID BM  . folic acid  1 mg Intravenous Daily  . hydrocortisone sod succinate (SOLU-CORTEF) inj  50 mg Intravenous Q6H   . insulin aspart  0-20 Units Subcutaneous Q4H  . ipratropium-albuterol  3 mL Nebulization Q6H  . mouth rinse  15 mL Mouth Rinse BID  . multivitamin  1 tablet Oral QHS  . multivitamin with minerals  1 tablet Oral Daily  . sodium chloride flush  3 mL Intravenous Q12H  . thiamine injection  100 mg Intravenous Daily  . tranexamic acid (CYKLOKAPRON) topical -INTRAOP  2,000 mg Topical Once    Assessment: 58 yr male had B/l knee replacements Sep 01, 2018 was admitted with fever, AKI/ Platelet count has been declining since 6/5. He was started on heparin 6/3. Child Pugh  score estimated to be 5-6  Baseline aPTT: 35s 09/23/18@1525 : aPTT 91s 09/23/18@1823 : aPTT 64s 09/23/18@2013 : aPTT 63s    Goal of Therapy:  aPTT 50-90 seconds Monitor platelets by anticoagulation protocol: Yes   Plan:  06/07 @ 0330 aPTT 60 seconds therapeutic. Will continue current argatroban rate @ 0.5 mcg/kg/min since patient is CP class B as well. CBC has trended down will continue to monitor CBC and recheck aPTT w/ am labs  Tobie Lords, PharmD 09/24/2018,4:53 AM

## 2018-09-24 NOTE — Progress Notes (Signed)
Pre HD Assessment   09/24/18 1640  Neurological  Level of Consciousness Alert  Orientation Level Oriented to person;Oriented to place;Oriented to time;Disoriented to situation  Respiratory  Respiratory Pattern Regular  Chest Assessment Chest expansion symmetrical  Bilateral Breath Sounds Diminished;Clear  Cardiac  Pulse Regular  Heart Sounds S1, S2;No adventitious heart sounds  Jugular Venous Distention (JVD) No  Cardiac Rhythm ST  Antiarrhythmic device No  Vascular  R Radial Pulse +2  L Radial Pulse +2  Edema Generalized  Generalized Edema +2  Integumentary  Integumentary (WDL) X  Skin Color Appropriate for ethnicity  Skin Condition Dry  Skin Integrity Surgical Incision (see LDA);Abrasion  Additional Integumentary Comments  (HD Pt new start)  Musculoskeletal  Musculoskeletal (WDL) X  Generalized Weakness Yes  Gastrointestinal  Bowel Sounds Assessment Active  GU Assessment  Genitourinary (WDL) X  Genitourinary Symptoms Urinary Catheter  Psychosocial  Psychosocial (WDL) X  Patient Behaviors Cooperative;Restless  Emotional support given Given to patient

## 2018-09-24 NOTE — Progress Notes (Signed)
[facility]  ROUNDING NOTE   Subjective:    06/06 0701 - 06/07 0700 In: 2215.6 [I.V.:1489.7; IV Piggyback:725.9] Out: 225 [Urine:400; Stool:325]  S Creatinine significantly higher at 3.42-> 6->7.7->9.2->8.30 K is low at 3.1, BUN 56 UOP remains low,  Patient is more alert. Answeres simple Qs but oriented to self and place    Objective:  Vital signs in last 24 hours:  Temp:  [97.7 F (36.5 C)-98.5 F (36.9 C)] 98.5 F (36.9 C) (06/07 0800) Pulse Rate:  [81-109] 81 (06/07 0500) Resp:  [14-35] 20 (06/07 0500) BP: (109-162)/(78-108) 148/86 (06/07 0500) SpO2:  [90 %-99 %] 99 % (06/07 0500) Weight:  [151.4 kg] 151.4 kg (06/06 1300)  Weight change: -1.2 kg Filed Weights   09/22/18 1300 09/23/18 1000 09/23/18 1300  Weight: (!) 151.6 kg (!) 150.4 kg (!) 151.4 kg    Intake/Output: I/O last 3 completed shifts: In: 3523.5 [I.V.:2484.9; IV Piggyback:1038.6] Out: 365 [Urine:540; Stool:325]   Intake/Output this shift:  Total I/O In: 240 [P.O.:240] Out: 30 [Urine:30]  Physical Exam: General: NAD,critically  ill appearing  Head:  Dry oral mucosal membranes  Eyes: Anicteric,    Lungs:  Coarse breath sounds, O2 by [place]  Heart: tachycardic  Abdomen:  Soft, nontender, obese, distended, decreased BS  Extremities:  + peripheral edema., b.l knee dressing  Neurologic: Sleepy but arousable, Oriented to self, encephalopathic  Skin: warm    Foley in place, rectal tube    Basic Metabolic Panel: Recent Labs  Lab 09/19/18 0439 09/20/18 0500 09/21/18 0120 09/22/18 0411 09/22/18 1640 09/22/18 2013 09/23/18 0407 09/24/18 0327  NA 134* 137 139 140 142 142 144 144  K 3.8 3.9 4.3 3.5 3.1* 3.2* 3.2* 3.1*  CL 101 107 111 108 107 107 109 105  CO2 22 19* 17* 16* 20* 22 23 28   GLUCOSE 167* 174* 192* 303* 258* 222* 104* 158*  BUN 22* 30* 37* 58* 54* 55* 60* 56*  CREATININE 3.42* 5.98* 7.73* 9.24* 8.11* 8.89* 9.18* 8.30*  CALCIUM 8.2* 8.2* 8.3* 8.1* 8.0* 7.9* 8.1* 7.6*  MG  1.9 2.0 2.4  --   --  2.2 2.2  --   PHOS  --   --   --   --   --  4.0 4.1 3.7    Liver Function Tests: Recent Labs  Lab 09/19/18 0439 09/20/18 0505 09/22/18 0411 09/23/18 0407 09/24/18 0327  AST 70* 64* 44* 48*  --   ALT 43 35 27 28  --   ALKPHOS 71 67 62 59  --   BILITOT 0.6 0.7 0.8 0.5  --   PROT 6.0* 6.1* 6.1* 6.4*  --   ALBUMIN 1.9* 1.8* 1.8* 1.9* 1.9*   No results for input(s): LIPASE, AMYLASE in the last 168 hours. Recent Labs  Lab 09/20/18 0948 09/22/18 2014  AMMONIA 12 10    CBC: Recent Labs  Lab 09/19/18 0439  09/21/18 0120  09/22/18 0411 09/22/18 0700 09/22/18 2013 09/23/18 0407 09/24/18 0327  WBC 11.2*   < > 11.1*  --  10.3  --  9.5 9.9 8.6  NEUTROABS 6.4  --  8.0*  --  8.5*  --   --  7.5  --   HGB 9.0*   < > 8.7*   < > 7.8* 7.8* 7.7* 7.7* 7.0*  HCT 28.4*   < > 28.0*   < > 24.7* 24.5* 23.9* 23.8* 22.3*  MCV 85.3   < > 86.2  --  84.3  --  83.3 82.4 84.2  PLT 269   < > 276  --  124*  --  107* 102* 99*   < > = values in this interval not displayed.    Cardiac Enzymes: Recent Labs  Lab 09/19/18 1312 09/20/18 0948 09/23/18 1207  CKTOTAL 716* 992* 100    BNP: Invalid input(s): POCBNP  CBG: Recent Labs  Lab 09/23/18 1553 09/23/18 1935 09/23/18 2309 09/24/18 0320 09/24/18 0746  GLUCAP 85 171* 147* 133* 160*    Microbiology: Results for orders placed or performed during the hospital encounter of 09/12/18  Blood Culture (routine x 2)     Status: None   Collection Time: 09/12/18  9:56 AM  Result Value Ref Range Status   Specimen Description BLOOD LEFT HAND  Final   Special Requests   Final    BOTTLES DRAWN AEROBIC AND ANAEROBIC Blood Culture adequate volume   Culture   Final    NO GROWTH 5 DAYS Performed at [facility], [address] [place], [place] [address]    Report Status 09/17/2018 FINAL  Final  Urine culture     Status: None   Collection Time: 09/12/18 10:01 AM  Result Value Ref Range Status   Specimen  Description   Final    URINE, RANDOM Performed at [facility], [address], [place], [place] [address]    Special Requests   Final    NONE Performed at [facility], [address] [place], [place] [address]    Culture   Final    NO GROWTH Performed at [facility], [place] [address], [place], [place] [address]    Report Status 09/13/2018 FINAL  Final  SARS Coronavirus 2 (CEPHEID- Performed in [facility] hospital lab), Hosp Order     Status: None   Collection Time: 09/12/18 10:02 AM  Result Value Ref Range Status   SARS Coronavirus 2 NEGATIVE NEGATIVE Final    Comment: (NOTE) If result is NEGATIVE SARS-CoV-2 target nucleic acids are NOT DETECTED. The SARS-CoV-2 RNA is generally detectable in upper and lower  respiratory specimens during the acute phase of infection. The lowest  concentration of SARS-CoV-2 viral copies this assay can detect is 250  copies / mL. A negative result does not preclude SARS-CoV-2 infection  and should not be used as the sole basis for treatment or other  patient management decisions.  A negative result may occur with  improper specimen collection / handling, submission of specimen other  than nasopharyngeal swab, presence of viral mutation(s) within the  areas targeted by this assay, and inadequate number of viral copies  (<250 copies / mL). A negative result must be combined with clinical  observations, patient history, and epidemiological information. If result is POSITIVE SARS-CoV-2 target nucleic acids are DETECTED. The SARS-CoV-2 RNA is generally detectable in upper and lower  respiratory specimens dur ing the acute phase of infection.  Positive  results are indicative of active infection with SARS-CoV-2.  Clinical  correlation with patient history and other diagnostic information is  necessary to determine patient infection status.  Positive results do  not rule out bacterial infection or co-infection with other  viruses. If result is PRESUMPTIVE POSTIVE SARS-CoV-2 nucleic acids MAY BE PRESENT.   A presumptive positive result was obtained on the submitted specimen  and confirmed on repeat testing.  While 2019 novel coronavirus  (SARS-CoV-2) nucleic acids may be present in the submitted sample  additional confirmatory testing may be necessary for epidemiological  and / or clinical management purposes  to differentiate between  SARS-CoV-2 and other Sarbecovirus currently known to infect humans.  If clinically indicated additional testing with an alternate test  methodology [phone]) is advised. The SARS-CoV-2 RNA is generally  detectable in upper and lower respiratory sp ecimens during the acute  phase of infection. The expected result is Negative. Fact Sheet for Patients:  StrictlyIdeas.no Fact Sheet for Healthcare Providers: BankingDealers.co.za This test is not yet approved or cleared by the [place] FDA and has been authorized for detection and/or diagnosis of SARS-CoV-2 by FDA under an Emergency Use Authorization (EUA).  This EUA will remain in effect (meaning this test can be used) for the duration of the COVID-19 declaration under Section 564(b)(1) of the Act, 21 U.S.C. section 360bbb-3(b)(1), unless the authorization is terminated or revoked sooner. Performed at [facility], [address] [place], [place] [address]   Blood Culture (routine x 2)     Status: None   Collection Time: 09/12/18 10:06 AM  Result Value Ref Range Status   Specimen Description BLOOD LEFT ANTECUBITAL  Final   Special Requests   Final    BOTTLES DRAWN AEROBIC AND ANAEROBIC Blood Culture adequate volume   Culture   Final    NO GROWTH 5 DAYS Performed at [facility], [address] [place], [place] [address]    Report Status 09/17/2018 FINAL  Final  MRSA PCR Screening     Status: None   Collection Time: 09/12/18  7:04 PM  Result Value  Ref Range Status   MRSA by PCR NEGATIVE NEGATIVE Final    Comment:        The GeneXpert MRSA Assay (FDA approved for NASAL specimens only), is one component of a comprehensive MRSA colonization surveillance program. It is not intended to diagnose MRSA infection nor to guide or monitor treatment for MRSA infections. Performed at [facility], [address] [place], [place] [address]   C difficile quick scan w PCR reflex     Status: None   Collection Time: 09/14/18  2:21 PM  Result Value Ref Range Status   C Diff antigen NEGATIVE NEGATIVE Final   C Diff toxin NEGATIVE NEGATIVE Final   C Diff interpretation No C. difficile detected.  Final    Comment: Performed at [facility], [address] [place], [place] [address]  Group A Strep by PCR     Status: None   Collection Time: 09/15/18  1:53 PM  Result Value Ref Range Status   Group A Strep by PCR NOT DETECTED NOT DETECTED Final    Comment: Performed at [facility], [address], [place], [place] [address]  Body fluid culture     Status: None   Collection Time: 09/15/18  5:13 PM  Result Value Ref Range Status   Specimen Description   Final    KNEE Performed at [facility], [address] [place], [place] [address]    Special Requests   Final    NONE Performed at [facility], [address], [place], [place] [address]    Gram Stain   Final    RARE RBCS FEW WBC SEEN NO ORGANISMS SEEN Performed at [facility], [address] [place], [place] [address]    Culture   Final    NO GROWTH 3 DAYS Performed at [facility], [place] [address], [place], [place] [address]    Report Status 09/19/2018 FINAL  Final  Body fluid culture     Status: None   Collection Time: 09/16/18  8:55 AM  Result Value Ref Range Status   Specimen Description KNEE RIGHT  Final   Special Requests NONE  Final   Gram Stain   Final    RARE WBC PRESENT, PREDOMINANTLY PMN NO  ORGANISMS SEEN    Culture   Final    NO GROWTH 3 DAYS Performed at [facility], [place] [address] [place], [place] [address]    Report Status 09/20/2018 FINAL  Final  Aerobic/Anaerobic Culture (surgical/deep wound)     Status: None   Collection Time: 09/17/18 11:32 AM  Result Value Ref Range Status   Specimen Description   Final    TISSUE LEFT KNEE 1 Performed at [facility], [address] [place], [place] [address]    Special Requests PATIENT ON FOLLOWING MAXIPIME VANCOMYCIN  Final   Gram Stain   Final    FEW WBC PRESENT,BOTH PMN AND MONONUCLEAR NO ORGANISMS SEEN    Culture   Final    No growth aerobically or anaerobically. Performed at [facility], [place] [address] [place], [place] [address]    Report Status 09/22/2018 FINAL  Final  Acid Fast Smear (AFB)     Status: None   Collection Time: 09/17/18 11:32 AM  Result Value Ref Range Status   AFB Specimen Processing Comment  Final    Comment: Tissue Grinding and Digestion/Decontamination   Acid Fast Smear Negative  Final    Comment: (NOTE) Performed At: [facility] [place], [place] [id number] Rush Farmer MD OZ:[id number]    Source (AFB) TISSUE  Final    Comment: LEFT KNEE Performed at [facility], [place] [address] [place], [place] [address]   Culture, fungus without smear     Status: None (Preliminary result)   Collection Time: 09/17/18 11:34 AM  Result Value Ref Range Status   Specimen Description   Final    SYNOVIAL Performed at [facility], [address] [place], [place] [address]    Special Requests   Final    NONE Performed at [facility], [address] [place], [place] [address]    Culture   Final    NO FUNGUS ISOLATED AFTER 2 DAYS Performed at [facility], [place] [address], [place], [place] [address]    Report Status PENDING  Incomplete  Acid Fast Culture with reflexed sensitivities     Status: None   Collection Time:  09/17/18 11:36 AM  Result Value Ref Range Status   Acid Fast Culture CANC  Final    Comment: (NOTE) Test cancelled at client's request.      Cancelled per Levin Erp 09/21/2018 Performed At: [facility] [place], [place] [id number] Rush Farmer MD [id number]    Source of Sample TISSUE  Final    Comment: RIGHT KNEE Performed at [facility], [place] [address], [place], [place] [address]   Aerobic/Anaerobic Culture (surgical/deep wound)     Status: None   Collection Time: 09/17/18 11:43 AM  Result Value Ref Range Status   Specimen Description   Final    TISSUE LEFT KNEE 2 Performed at [facility], [address] [place], [place] [address]    Special Requests PATIENT ON FOLLOWING MAXIPIME VANCOMYCIN  Final   Gram Stain   Final    FEW WBC PRESENT, PREDOMINANTLY PMN NO ORGANISMS SEEN    Culture  Final    No growth aerobically or anaerobically. Performed at [facility], [place] [address] [place], [place] [address]    Report Status 09/22/2018 FINAL  Final  Aerobic/Anaerobic Culture (surgical/deep wound)     Status: None   Collection Time: 09/17/18 11:44 AM  Result Value Ref Range Status   Specimen Description   Final    TISSUE LEFT KNEE 3 Performed at [facility], [address] [place], [place] [address]    Special Requests PATIENT ON FOLLOWING MAXIPIME VANCOMYCIN  Final   Gram Stain   Final    MODERATE WBC PRESENT, PREDOMINANTLY PMN NO ORGANISMS SEEN    Culture   Final    No growth aerobically or anaerobically. Performed at [facility], [place] [address], [place], [place] [address]    Report Status 09/22/2018 FINAL  Final  Aerobic/Anaerobic Culture (surgical/deep wound)     Status: None (Preliminary result)   Collection Time: 09/18/18  9:43 PM  Result Value Ref Range Status   Specimen Description   Final    SYNOVIAL Performed at [facility], [address] [place], [place] [address]     Special Requests   Final    NONE Performed at [facility], [address] [place], [place] [address]    Gram Stain   Final    RARE WBC PRESENT, PREDOMINANTLY MONONUCLEAR NO ORGANISMS SEEN    Culture   Final    NO GROWTH 4 DAYS NO ANAEROBES ISOLATED; CULTURE IN PROGRESS FOR 5 DAYS Performed at [facility], [place] [address] [place], [place] [address]    Report Status PENDING  Incomplete  Aerobic/Anaerobic Culture (surgical/deep wound)     Status: None (Preliminary result)   Collection Time: 09/18/18  9:44 PM  Result Value Ref Range Status   Specimen Description   Final    SYNOVIAL Performed at [facility], [address], [place], [place] [address]    Special Requests   Final    RIGHT KNEE Performed at [facility], [address] [place], [place] [address]    Gram Stain   Final    FEW WBC PRESENT,BOTH PMN AND MONONUCLEAR NO ORGANISMS SEEN    Culture   Final    NO GROWTH 4 DAYS NO ANAEROBES ISOLATED; CULTURE IN PROGRESS FOR 5 DAYS Performed at [facility], [place] [address], [place], [place] [address]    Report Status PENDING  Incomplete  Aerobic/Anaerobic Culture (surgical/deep wound)     Status: None (Preliminary result)   Collection Time: 09/18/18  9:45 PM  Result Value Ref Range Status   Specimen Description   Final    SYNOVIAL Performed at [facility], [address], [place], [place] [address]    Special Requests   Final    RIGHT KNEE Performed at [facility], [address] [place], [place] [address]    Gram Stain   Final    FEW WBC PRESENT, PREDOMINANTLY MONONUCLEAR NO ORGANISMS SEEN    Culture   Final    NO GROWTH 4 DAYS NO ANAEROBES ISOLATED; CULTURE IN PROGRESS FOR 5 DAYS Performed at [facility], [place] [address], [place], [place] [address]    Report Status PENDING  Incomplete  Culture, blood (Routine X 2) w Reflex to ID Panel     Status: None   Collection Time: 09/19/18  1:11 PM   Result Value Ref Range Status   Specimen Description BLOOD  LEFT ANTECUBITAL  Final   Special Requests   Final    BOTTLES DRAWN AEROBIC AND ANAEROBIC Blood Culture results may not be optimal due to an excessive volume of blood received in culture bottles   Culture   Final    NO GROWTH 5 DAYS Performed at [facility], [address] [place], [place] [address]    Report Status 09/24/2018 FINAL  Final  Culture, blood (Routine X 2) w Reflex to ID Panel     Status: None   Collection Time: 09/19/18  1:20 PM  Result Value Ref Range Status   Specimen Description BLOOD BLOOD RIGHT HAND  Final   Special Requests   Final    BOTTLES DRAWN AEROBIC AND ANAEROBIC Blood Culture results may not be optimal due to an excessive volume of blood received in culture bottles   Culture   Final    NO GROWTH 5 DAYS Performed at [facility], [address], [place], [place] [address]    Report Status 09/24/2018 FINAL  Final  Urine Culture     Status: None   Collection Time: 09/20/18  3:59 PM  Result Value Ref Range Status   Specimen Description   Final    URINE, RANDOM Performed at [facility], [address] [place], [place] [address]    Special Requests   Final    NONE Performed at [facility], [address], [place], [place] [address]    Culture   Final    NO GROWTH Performed at [facility], [place] [address] [place], [place] [address]    Report Status 09/21/2018 FINAL  Final  C difficile quick scan w PCR reflex     Status: None   Collection Time: 09/21/18  3:27 PM  Result Value Ref Range Status   C Diff antigen NEGATIVE NEGATIVE Final   C Diff toxin NEGATIVE NEGATIVE Final   C Diff interpretation No C. difficile detected.  Final    Comment: Performed at [facility], [address] [place],  [address]  Gastrointestinal Panel by PCR , Stool     Status: None   Collection Time: 09/21/18  3:27 PM  Result Value Ref Range Status    Campylobacter species NOT DETECTED NOT DETECTED Final   Plesimonas shigelloides NOT DETECTED NOT DETECTED Final   Salmonella species NOT DETECTED NOT DETECTED Final   Yersinia enterocolitica NOT DETECTED NOT DETECTED Final   Vibrio species NOT DETECTED NOT DETECTED Final   Vibrio cholerae NOT DETECTED NOT DETECTED Final   Enteroaggregative E coli (EAEC) NOT DETECTED NOT DETECTED Final   Enteropathogenic E coli (EPEC) NOT DETECTED NOT DETECTED Final   Enterotoxigenic E coli (ETEC) NOT DETECTED NOT DETECTED Final   Shiga like toxin producing E coli (STEC) NOT DETECTED NOT DETECTED Final   Shigella/Enteroinvasive E coli (EIEC) NOT DETECTED NOT DETECTED Final   Cryptosporidium NOT DETECTED NOT DETECTED Final   Cyclospora cayetanensis NOT DETECTED NOT DETECTED Final   Entamoeba histolytica NOT DETECTED NOT DETECTED Final   Giardia lamblia NOT DETECTED NOT DETECTED Final   Adenovirus F40/41 NOT DETECTED NOT DETECTED Final   Astrovirus NOT DETECTED NOT DETECTED Final   Norovirus GI/GII NOT DETECTED NOT DETECTED Final   Rotavirus A NOT DETECTED NOT DETECTED Final   Sapovirus (I, II, IV, and V) NOT DETECTED NOT DETECTED Final    Comment: Performed at [facility], [address] [place],  [address]    Coagulation Studies: No  results for input(s): LABPROT, INR in the last 72 hours.  Urinalysis: No results for input(s): COLORURINE, LABSPEC, PHURINE, GLUCOSEU, HGBUR, BILIRUBINUR, KETONESUR, PROTEINUR, UROBILINOGEN, NITRITE, LEUKOCYTESUR in the last 72 hours.  Invalid input(s): APPERANCEUR    Imaging: No results found.   Medications:   . sodium chloride Stopped (09/22/18 2221)  . sodium chloride    . anidulafungin Stopped (09/23/18 1736)  . argatroban 0.5 mcg/kg/min (09/24/18 0700)  . doxycycline (VIBRAMYCIN) IV Stopped (09/24/18 0104)  . norepinephrine (LEVOPHED) Adult infusion Stopped (09/20/18 1953)  . piperacillin-tazobactam (ZOSYN)  IV 12.5 mL/hr at 09/24/18 0700   .  sodium bicarbonate (isotonic) infusion in sterile water 50 mL/hr at 09/24/18 0700   . sodium chloride   Intravenous Once  . sodium chloride   Intravenous Once  . aspirin EC  81 mg Oral Daily  . budesonide (PULMICORT) nebulizer solution  0.5 mg Nebulization BID  . Chlorhexidine Gluconate Cloth  6 each Topical Q0600  . feeding supplement (NEPRO CARB STEADY)  237 mL Oral BID BM  . folic acid  1 mg Intravenous Daily  . hydrocortisone sod succinate (SOLU-CORTEF) inj  50 mg Intravenous Q6H  . insulin aspart  0-20 Units Subcutaneous Q4H  . ipratropium-albuterol  3 mL Nebulization Q6H  . mouth rinse  15 mL Mouth Rinse BID  . multivitamin  1 tablet Oral QHS  . multivitamin with minerals  1 tablet Oral Daily  . sodium chloride flush  3 mL Intravenous Q12H  . thiamine injection  100 mg Intravenous Daily  . tranexamic acid (CYKLOKAPRON) topical -INTRAOP  2,000 mg Topical Once   sodium chloride, sodium chloride, acetaminophen **OR** acetaminophen, albuterol, fentaNYL (SUBLIMAZE) injection, heparin, HYDROcodone-acetaminophen, iohexol, LORazepam, menthol-cetylpyridinium **OR** phenol, menthol-cetylpyridinium **OR** phenol, metoCLOPramide **OR** metoCLOPramide (REGLAN) injection, ondansetron **OR** ondansetron (ZOFRAN) IV, sodium chloride flush  Assessment/ Plan:  [name] is a 58 y.o. black male with hyeprtension, COPD, diabetes mellitus type II who underwent bilateral knee replacement surgeries last week. who was admitted to [facility] on 09/12/2018 for sepsis  1. Acute renal failure with metabolic acidosis  baseline creatinine of 1.07 with normal GFR in 06/12/18.  Likely severe ATN from hypotension and sepsis -  Hold nephrotoxic meds - patient remains delirious, oliguric. Creatinine critically elevated, acidotic -  Since patient is showing improvement with HD, will dialyze today to correct uremia - d/c bicarb suplpementation  2. Septic shock/hypotension  -Recent bilateral knee replacement  on Sep 01, 2018 [facility]  -  knee septic arthritis, patient underwent I&D and polyethylene exchange on 5/31 and Rt knee on 6/1 -Currently being treated with anidulafungin, Doxy and Zosyn - dose adjustment as per pharmacist  3. Hypokalemia - 4 K dialysate - may replace with small doses as needed       LOS: 12 Masha Orbach 6/7/202010:23 AM

## 2018-09-24 NOTE — Progress Notes (Signed)
Pre HD Tx   09/24/18 1640  Hand-Off documentation  Report given to (Full Name) Trellis Paganini RN  Report received from (Full Name) Cameron Proud RN  Vital Signs  Temp 97.7 F (36.5 C)  Temp Source Oral  Pulse Rate (!) 102  Pulse Rate Source Monitor  Resp 16  BP (!) 160/91  BP Location Left Arm  BP Method Automatic  Patient Position (if appropriate) Lying  Oxygen Therapy  SpO2 92 %  O2 Device Room Air  Pain Assessment  Pain Scale 0-10  Pain Score 0  Dialysis Weight  Weight (!) 153.7 kg  Type of Weight Pre-Dialysis  Time-Out for Hemodialysis  What Procedure? Hemodialysis  Pt Identifiers(min of two) MRN/Account#;First/Last Name  Correct Site? Yes  Correct Side? Yes  Correct Procedure? Yes  Consents Verified? Yes  Rad Studies Available? N/A  Safety Precautions Reviewed? Yes  Engineer, civil (consulting) Number 5  Station Number  [id number] [id number])  UF/Alarm Test Passed  Conductivity: Meter 14  Conductivity: Machine  14  pH 7.2  Reverse Osmosis WRO4  Normal Saline Lot Number [id number]  Dialyzer Lot Number [id number]  Disposable Set Lot Number [id number]  Machine Temperature 98.6 F (37 C)  Musician and Audible Yes  Blood Lines Intact and Secured Yes  Pre Treatment Patient Checks  Vascular access used during treatment Catheter  Hepatitis B Surface Antigen Results Negative  Date Hepatitis B Surface Antigen Drawn 09/21/18  Hepatitis B Surface Antibody  (<10)  Date Hepatitis B Surface Antibody Drawn 09/21/18  Hemodialysis Consent Verified Yes  Hemodialysis Standing Orders Initiated Yes  ECG (Telemetry) Monitor On Yes  Prime Ordered Normal Saline  Length of  DialysisTreatment -hour(s) 2.5 Hour(s)  Dialysis Treatment Comments Na 140  Dialyzer Elisio 17H NR  Dialysate 4K  Dialysate Flow Ordered 600  Blood Flow Rate Ordered 300 mL/min  Ultrafiltration Goal 0 Liters  Dialysis Blood Pressure Support Ordered Normal Saline  Education / Care Plan  Dialysis Education Provided No  (Comment)  Documented Education in Care Plan  (pt confused)  Hemodialysis Catheter Left Internal jugular Triple lumen Temporary  Placement Date/Time: 09/21/18 1100   Placed prior to admission: No  Time Out: Correct patient;Correct procedure;Correct site  Maximum sterile barrier precautions: Hand hygiene;Sterile gown;Cap;Sterile gloves;Large sterile sheet;Mask  Site Prep: Chlorh...  Site Condition No complications  Blue Lumen Status Capped (Central line)  Red Lumen Status Capped (Central line)  Purple Lumen Status Infusing  Dressing Type Biopatch;Occlusive  Dressing Status Dressing changed  Interventions New dressing;Dressing changed  Drainage Description None  Dressing Change Due 10/01/18

## 2018-09-24 NOTE — Progress Notes (Signed)
CRITICAL CARE NOTE   SYNOPSIS  Admitted for severe sepsis from infected LEFT knee  S/p B/L  knee replacements-s/p multiple knee wash outs complicated by severe encephalopathy from DT's with h/o extensive ETOH abuse and progressive renal failure with metabolic acidosis  CC  follow up sepsis  SUBJECTIVE Patient remains critically ill Prognosis is guarded On CIWA S/p HD Checking for HIT On argatroban One unit of blood to be given  CBC    Component Value Date/Time   WBC 8.6 09/24/2018 0327   RBC 2.65 (L) 09/24/2018 0327   HGB 7.0 (L) 09/24/2018 0327   HCT 22.3 (L) 09/24/2018 0327   PLT 99 (L) 09/24/2018 0327   MCV 84.2 09/24/2018 0327   MCH 26.4 09/24/2018 0327   MCHC 31.4 09/24/2018 0327   RDW 15.4 09/24/2018 0327   LYMPHSABS 1.1 09/23/2018 0407   MONOABS 1.2 (H) 09/23/2018 0407   EOSABS 0.0 09/23/2018 0407   BASOSABS 0.0 09/23/2018 0407   BMP Latest Ref Rng & Units 09/24/2018 09/23/2018 09/22/2018  Glucose 70 - 99 mg/dL 158(H) 104(H) 222(H)  BUN 6 - 20 mg/dL 56(H) 60(H) 55(H)  Creatinine 0.61 - 1.24 mg/dL 8.30(H) 9.18(H) 8.89(H)  BUN/Creat Ratio 6 - 22 (calc) - - -  Sodium 135 - 145 mmol/L 144 144 142  Potassium 3.5 - 5.1 mmol/L 3.1(L) 3.2(L) 3.2(L)  Chloride 98 - 111 mmol/L 105 109 107  CO2 22 - 32 mmol/L 28 23 22   Calcium 8.9 - 10.3 mg/dL 7.6(L) 8.1(L) 7.9(L)      BP (!) 148/86   Pulse 81   Temp 97.9 F (36.6 C) (Oral)   Resp 20   Ht 6\' 4"  (1.93 m)   Wt (!) 151.4 kg   SpO2 99%   BMI 40.63 kg/m    I/O last 3 completed shifts: In: 3523.5 [I.V.:2484.9; IV Piggyback:1038.6] Out: 365 [Urine:540; Stool:325] No intake/output data recorded.  SpO2: 99 % O2 Flow Rate (L/min): 6 L/min FiO2 (%): 30 %   SIGNIFICANT EVENTS 5/26 admitted for sepsis LEFT knee infection 5/27-6/1 visits to OR for wash outs of knees 6/2 developed severe DT's 6/3 developed progressive renal failure 6/3 [place] + DVT, started heparin PLT count 350 6/5 started HD, severe DT's CIWA  protocol 6/6 severe DT's, HD again, PLT count 100, check HIT and started argatroban   REVIEW OF SYSTEMS  PATIENT IS UNABLE TO PROVIDE COMPLETE REVIEW OF SYSTEMS DUE TO SEVERE CRITICAL ILLNESS   PHYSICAL EXAMINATION:  GENERAL:critically ill appearing HEAD: Normocephalic, atraumatic.  EYES: Pupils equal, round, reactive to light.  No scleral icterus.  MOUTH: Moist mucosal membrane. NECK: Supple. No thyromegaly. No nodules. No JVD.  PULMONARY: +rhonchi, +wheezing CARDIOVASCULAR: S1 and S2. Regular rate and rhythm. No murmurs, rubs, or gallops.  GASTROINTESTINAL: Soft, nontender, -distended. No masses. Positive bowel sounds. No hepatosplenomegaly.  MUSCULOSKELETAL: No swelling, clubbing, or edema.  NEUROLOGIC: lethargic SKIN:intact,warm,dry  MEDICATIONS: I have reviewed all medications and confirmed regimen as documented   CULTURE RESULTS   Recent Results (from the past 240 hour(s))  C difficile quick scan w PCR reflex     Status: None   Collection Time: 09/14/18  2:21 PM  Result Value Ref Range Status   C Diff antigen NEGATIVE NEGATIVE Final   C Diff toxin NEGATIVE NEGATIVE Final   C Diff interpretation No C. difficile detected.  Final    Comment: Performed at [facility], [place], [place] [address]  Group A Strep by PCR  Status: None   Collection Time: 09/15/18  1:53 PM  Result Value Ref Range Status   Group A Strep by PCR NOT DETECTED NOT DETECTED Final    Comment: Performed at [facility], [address] [place], [place] [address]  Body fluid culture     Status: None   Collection Time: 09/15/18  5:13 PM  Result Value Ref Range Status   Specimen Description   Final    KNEE Performed at [facility], [address] [place], [place] [address]    Special Requests   Final    NONE Performed at [facility], [address] [place], [place] [address]    Gram Stain   Final    RARE RBCS FEW WBC SEEN NO  ORGANISMS SEEN Performed at [facility], [address], [place], [place] [address]    Culture   Final    NO GROWTH 3 DAYS Performed at [facility], [place] [address] [place], [place] [address]    Report Status 09/19/2018 FINAL  Final  Body fluid culture     Status: None   Collection Time: 09/16/18  8:55 AM  Result Value Ref Range Status   Specimen Description KNEE RIGHT  Final   Special Requests NONE  Final   Gram Stain   Final    RARE WBC PRESENT, PREDOMINANTLY PMN NO ORGANISMS SEEN    Culture   Final    NO GROWTH 3 DAYS Performed at [facility], [place] [address], [place], [place] [address]    Report Status 09/20/2018 FINAL  Final  Aerobic/Anaerobic Culture (surgical/deep wound)     Status: None   Collection Time: 09/17/18 11:32 AM  Result Value Ref Range Status   Specimen Description   Final    TISSUE LEFT KNEE 1 Performed at [facility], [address] [place], [place] [address]    Special Requests PATIENT ON FOLLOWING MAXIPIME VANCOMYCIN  Final   Gram Stain   Final    FEW WBC PRESENT,BOTH PMN AND MONONUCLEAR NO ORGANISMS SEEN    Culture   Final    No growth aerobically or anaerobically. Performed at [facility], [place] [address], [place], [place] [address]    Report Status 09/22/2018 FINAL  Final  Acid Fast Smear (AFB)     Status: None   Collection Time: 09/17/18 11:32 AM  Result Value Ref Range Status   AFB Specimen Processing Comment  Final    Comment: Tissue Grinding and Digestion/Decontamination   Acid Fast Smear Negative  Final    Comment: (NOTE) Performed At: [facility] [place], [place] [id number] Rush Farmer MD QI:[id number]    Source (AFB) TISSUE  Final    Comment: LEFT KNEE Performed at [facility], [place] [address] [place], [place] [address]   Culture, fungus without smear     Status: None (Preliminary result)   Collection Time: 09/17/18 11:34 AM  Result Value Ref Range  Status   Specimen Description   Final    SYNOVIAL Performed at [facility], [address] [place], [place] [address]    Special Requests   Final    NONE Performed at [facility], [address] [place], [place] [address]    Culture   Final    NO FUNGUS ISOLATED AFTER 2 DAYS Performed at [facility], [place] [address] [place], [place] [address]    Report Status PENDING  Incomplete  Acid Fast Culture with reflexed sensitivities     Status: None   Collection Time: 09/17/18 11:36 AM  Result Value Ref Range Status   Acid Fast Culture CANC  Final    Comment: (NOTE) Test cancelled at client's request.      Cancelled per Levin Erp 09/21/2018 Performed At: [facility] [place], [place] [id number] Rush Farmer MD YJ:[id number]    Source of Sample TISSUE  Final    Comment: RIGHT KNEE Performed at [facility], [place] [address] [place], [place] [address]   Aerobic/Anaerobic Culture (surgical/deep wound)     Status: None   Collection Time: 09/17/18 11:43 AM  Result Value Ref Range Status   Specimen Description   Final    TISSUE LEFT KNEE 2 Performed at [facility], [place] [place], [place] [address]    Special Requests PATIENT ON FOLLOWING MAXIPIME VANCOMYCIN  Final   Gram Stain   Final    FEW WBC PRESENT, PREDOMINANTLY PMN NO ORGANISMS SEEN    Culture   Final    No growth aerobically or anaerobically. Performed at [facility], [place] [address], [place], [place] [address]    Report Status 09/22/2018 FINAL  Final  Aerobic/Anaerobic Culture (surgical/deep wound)     Status: None   Collection Time: 09/17/18 11:44 AM  Result Value Ref Range Status   Specimen Description   Final    TISSUE LEFT KNEE 3 Performed at [facility], [place] [place], [place] [address]    Special Requests PATIENT ON FOLLOWING MAXIPIME VANCOMYCIN  Final   Gram Stain   Final    MODERATE WBC PRESENT,  PREDOMINANTLY PMN NO ORGANISMS SEEN    Culture   Final    No growth aerobically or anaerobically. Performed at [facility], [place] [address], [place], [place] [address]    Report Status 09/22/2018 FINAL  Final  Aerobic/Anaerobic Culture (surgical/deep wound)     Status: None (Preliminary result)   Collection Time: 09/18/18  9:43 PM  Result Value Ref Range Status   Specimen Description   Final    SYNOVIAL Performed at [facility], [address] [place], [place] [address]    Special Requests   Final    NONE Performed at [facility], [place] [place], [place] [address]    Gram Stain   Final    RARE WBC PRESENT, PREDOMINANTLY MONONUCLEAR NO ORGANISMS SEEN    Culture   Final    NO GROWTH 4 DAYS NO ANAEROBES ISOLATED; CULTURE IN PROGRESS FOR 5 DAYS Performed at [facility], [place] [address] [place], [place] [address]    Report Status PENDING  Incomplete  Aerobic/Anaerobic Culture (surgical/deep wound)     Status: None (Preliminary result)   Collection Time: 09/18/18  9:44 PM  Result Value Ref Range Status   Specimen Description   Final    SYNOVIAL Performed at [facility], [address], [place], [place] [address]    Special Requests   Final    RIGHT KNEE Performed at [facility], [place] [place], [place] [address]    Gram Stain   Final    FEW WBC PRESENT,BOTH PMN AND MONONUCLEAR NO ORGANISMS SEEN    Culture   Final    NO GROWTH 4 DAYS NO ANAEROBES ISOLATED; CULTURE IN PROGRESS FOR 5 DAYS Performed at [facility], [place],  [place] [address]    Report Status PENDING  Incomplete  Aerobic/Anaerobic Culture (surgical/deep wound)     Status: None (Preliminary result)   Collection Time: 09/18/18  9:45 PM  Result Value Ref Range Status   Specimen Description   Final    SYNOVIAL Performed at [facility], [address], [place], [place] [address]    Special Requests    Final    RIGHT KNEE Performed at [facility], [address] [place], [place] [address]    Gram Stain   Final    FEW WBC PRESENT, PREDOMINANTLY MONONUCLEAR NO ORGANISMS SEEN    Culture   Final    NO GROWTH 4 DAYS NO ANAEROBES ISOLATED; CULTURE IN PROGRESS FOR 5 DAYS Performed at [facility], [place] [address] [place], [place] [address]    Report Status PENDING  Incomplete  Culture, blood (Routine X 2) w Reflex to ID Panel     Status: None   Collection Time: 09/19/18  1:11 PM  Result Value Ref Range Status   Specimen Description BLOOD LEFT ANTECUBITAL  Final   Special Requests   Final    BOTTLES DRAWN AEROBIC AND ANAEROBIC Blood Culture results may not be optimal due to an excessive volume of blood received in culture bottles   Culture   Final    NO GROWTH 5 DAYS Performed at [facility], [address] [place], [place] [address]    Report Status 09/24/2018 FINAL  Final  Culture, blood (Routine X 2) w Reflex to ID Panel     Status: None   Collection Time: 09/19/18  1:20 PM  Result Value Ref Range Status   Specimen Description BLOOD BLOOD RIGHT HAND  Final   Special Requests   Final    BOTTLES DRAWN AEROBIC AND ANAEROBIC Blood Culture results may not be optimal due to an excessive volume of blood received in culture bottles   Culture   Final    NO GROWTH 5 DAYS Performed at [facility], [address] [place], [place] [address]    Report Status 09/24/2018 FINAL  Final  Urine Culture     Status: None   Collection Time: 09/20/18  3:59 PM  Result Value Ref Range Status   Specimen Description   Final    URINE, RANDOM Performed at [facility], [address] [place], [place] [address]    Special Requests   Final    NONE Performed at [facility], [address] [place], [place] [address]    Culture   Final    NO GROWTH Performed at [facility], [place] [address] [place], [place] [address]    Report Status  09/21/2018 FINAL  Final  C difficile quick scan w PCR reflex     Status: None   Collection Time: 09/21/18  3:27 PM  Result Value Ref Range Status   C Diff antigen NEGATIVE NEGATIVE Final   C Diff toxin NEGATIVE NEGATIVE Final   C Diff interpretation No C. difficile detected.  Final    Comment: Performed at [facility], [place] [place], [place] [address]  Gastrointestinal Panel by PCR , Stool     Status: None   Collection Time: 09/21/18  3:27 PM  Result Value Ref Range Status   Campylobacter species NOT DETECTED NOT DETECTED Final   Plesimonas shigelloides NOT DETECTED NOT DETECTED Final   Salmonella species NOT DETECTED NOT DETECTED Final   Yersinia enterocolitica NOT  DETECTED NOT DETECTED Final   Vibrio species NOT DETECTED NOT DETECTED Final   Vibrio cholerae NOT DETECTED NOT DETECTED Final   Enteroaggregative E coli (EAEC) NOT DETECTED NOT DETECTED Final   Enteropathogenic E coli (EPEC) NOT DETECTED NOT DETECTED Final   Enterotoxigenic E coli (ETEC) NOT DETECTED NOT DETECTED Final   Shiga like toxin producing E coli (STEC) NOT DETECTED NOT DETECTED Final   Shigella/Enteroinvasive E coli (EIEC) NOT DETECTED NOT DETECTED Final   Cryptosporidium NOT DETECTED NOT DETECTED Final   Cyclospora cayetanensis NOT DETECTED NOT DETECTED Final   Entamoeba histolytica NOT DETECTED NOT DETECTED Final   Giardia lamblia NOT DETECTED NOT DETECTED Final   Adenovirus F40/41 NOT DETECTED NOT DETECTED Final   Astrovirus NOT DETECTED NOT DETECTED Final   Norovirus GI/GII NOT DETECTED NOT DETECTED Final   Rotavirus A NOT DETECTED NOT DETECTED Final   Sapovirus (I, II, IV, and V) NOT DETECTED NOT DETECTED Final    Comment: Performed at [facility], [address] [place], [place] [address]            Indwelling Urinary Catheter continued, requirement due to   Reason to continue Indwelling Urinary Catheter strict Intake/Output monitoring for hemodynamic instability    Central Line/ continued, requirement due to  Reason to continue [place] of central venous pressure or other hemodynamic parameters and poor IV access        ASSESSMENT AND PLAN SYNOPSIS  SEVERE SEPSIS FROM LEFT KNEE INFECTION INFECTIOUS DISEASE LEFT KNEE SEPTIC ARTHRITIS -continue antibiotics as prescribed -follow up cultures -follow up ID consultation CULTURES NEG   ACUTE KIDNEY INJURY/Renal Failure -follow chem 7 -follow UO -continue Foley Catheter-assess need -Avoid nephrotoxic agents -Recheck creatinine  On HD   SEVERE ALCOHOL WITHDRAWAL -Therapy with Thiamine and MVI -CIWA Protocol -Precedex as needed -High risk for intubation  -high risk for aspiration    SHOCK-SEPSIS -use vasopressors to keep MAP>65 if needed - stress dose steroids   CARDIAC ICU monitoring  ID -continue IV abx as prescibed -follow up cultures  GI GI PROPHYLAXIS as indicated  NUTRITIONAL STATUS DIET-->NPO Constipation protocol as indicated  ENDO - will use ICU hypoglycemic\Hyperglycemia protocol if indicated   ELECTROLYTES -follow labs as needed -replace as needed -pharmacy consultation and following   DVT/GI PRX ordered TRANSFUSIONS AS NEEDED MONITOR FSBS ASSESS the need for LABS as needed   Critical Care Time devoted to patient care services described in this note is 34 minutes.   Overall, patient is critically ill, prognosis is guarded.  Patient with Multiorgan failure and at high risk for cardiac arrest and death.    Corrin Parker, M.D.  [facility]  Medical Director [place] Director [facility] Cardio-Pulmonary Department

## 2018-09-24 NOTE — Progress Notes (Signed)
HD Tx Start    09/24/18 1645  Vital Signs  Pulse Rate (!) 107  Resp 20  BP (!) 163/95  Oxygen Therapy  SpO2 95 %  During Hemodialysis Assessment  Blood Flow Rate (mL/min) 300 mL/min  Arterial Pressure (mmHg) -100 mmHg  Venous Pressure (mmHg) 190 mmHg  Transmembrane Pressure (mmHg) 40 mmHg  Ultrafiltration Rate (mL/min) 190 mL/min (190 mL per HOUR removal)  Dialysate Flow Rate (mL/min) 600 ml/min  Conductivity: Machine  14  HD Safety Checks Performed Yes  Dialysis Fluid Bolus Normal Saline  Bolus Amount (mL) 250 mL  Intra-Hemodialysis Comments Tx initiated (venous pressure elevated after lumen reversal([email or web address]))  Hemodialysis Catheter Left Internal jugular Triple lumen Temporary  Placement Date/Time: 09/21/18 1100   Placed prior to admission: No  Time Out: Correct patient;Correct procedure;Correct site  Maximum sterile barrier precautions: Hand hygiene;Sterile gown;Cap;Sterile gloves;Large sterile sheet;Mask  Site Prep: Chlorh...  Site Condition No complications  Blue Lumen Status Infusing (used as arterial hemodialysis)  Red Lumen Status Infusing (used as venous hemodialysis(d/t blue high pressure))  Purple Lumen Status Infusing (blood transfusion )

## 2018-09-24 NOTE — Progress Notes (Signed)
Assisted tele visit to patient with family.  Hillard Danker, RN l

## 2018-09-24 NOTE — Progress Notes (Signed)
Subjective:  Patient in the ICU for treatment of sepsis following bilateral total knee arthroplasties.  Patient is more alert and responsive today.  He can better participate with his physical exam today.  Patient reports he is not having significant knee pain.  Patient is ordered for a transfusion of 1 unit of PRBCs for hemoglobin of 7 and hematocrit of 22.  Patient has had slight improvement in his creatinine which is 8.30 today.  Objective:   VITALS:   Vitals:   09/24/18 1100 09/24/18 1200 09/24/18 1300 09/24/18 1428  BP: (!) 151/127 (!) 150/80 (!) 144/80 (!) 141/70  Pulse: 94  94 88  Resp: 16 18 20 18   Temp:    97.8 F (36.6 C)  TempSrc:    Oral  SpO2: 100%  96%   Weight:      Height:        PHYSICAL EXAM: Bilateral lower extremities: Patient's dressings remain clean dry and intact.  Patient has a [place] on the left knee along with an Ace wrap overlying his Aquacel dressing.  Patient has bilateral lower extremity edema which is consistent with his examination yesterday.  Patient is able to flex and extend his toes and dorsiflex and plantarflex his ankles.  He has intact sensation light touch and palpable dorsalis pedis pulses.    LABS  Results for orders placed or performed during the hospital encounter of 09/12/18 (from the past 24 hour(s))  APTT     Status: Abnormal   Collection Time: 09/23/18  3:25 PM  Result Value Ref Range   aPTT 91 (H) 24 - 36 seconds  Glucose, capillary     Status: None   Collection Time: 09/23/18  3:53 PM  Result Value Ref Range   Glucose-Capillary 85 70 - 99 mg/dL  APTT     Status: Abnormal   Collection Time: 09/23/18  6:23 PM  Result Value Ref Range   aPTT 64 (H) 24 - 36 seconds  Glucose, capillary     Status: Abnormal   Collection Time: 09/23/18  7:35 PM  Result Value Ref Range   Glucose-Capillary 171 (H) 70 - 99 mg/dL  APTT     Status: Abnormal   Collection Time: 09/23/18  8:13 PM  Result Value Ref Range   aPTT 63 (H) 24 - 36  seconds  Glucose, capillary     Status: Abnormal   Collection Time: 09/23/18 11:09 PM  Result Value Ref Range   Glucose-Capillary 147 (H) 70 - 99 mg/dL  Glucose, capillary     Status: Abnormal   Collection Time: 09/24/18  3:20 AM  Result Value Ref Range   Glucose-Capillary 133 (H) 70 - 99 mg/dL  CBC     Status: Abnormal   Collection Time: 09/24/18  3:27 AM  Result Value Ref Range   WBC 8.6 4.0 - 10.5 K/uL   RBC 2.65 (L) 4.22 - 5.81 MIL/uL   Hemoglobin 7.0 (L) 13.0 - 17.0 g/dL   HCT 22.3 (L) 39.0 - 52.0 %   MCV 84.2 80.0 - 100.0 fL   MCH 26.4 26.0 - 34.0 pg   MCHC 31.4 30.0 - 36.0 g/dL   RDW 15.4 11.5 - 15.5 %   Platelets 99 (L) 150 - 400 K/uL   nRBC 0.0 0.0 - 0.2 %  APTT     Status: Abnormal   Collection Time: 09/24/18  3:27 AM  Result Value Ref Range   aPTT 60 (H) 24 - 36 seconds  Renal function panel  Status: Abnormal   Collection Time: 09/24/18  3:27 AM  Result Value Ref Range   Sodium 144 135 - 145 mmol/L   Potassium 3.1 (L) 3.5 - 5.1 mmol/L   Chloride 105 98 - 111 mmol/L   CO2 28 22 - 32 mmol/L   Glucose, Bld 158 (H) 70 - 99 mg/dL   BUN 56 (H) 6 - 20 mg/dL   Creatinine, Ser 8.30 (H) 0.61 - 1.24 mg/dL   Calcium 7.6 (L) 8.9 - 10.3 mg/dL   Phosphorus 3.7 2.5 - 4.6 mg/dL   Albumin 1.9 (L) 3.5 - 5.0 g/dL   GFR calc non Af Amer 6 (L) >60 mL/min   GFR calc Af Amer 7 (L) >60 mL/min   Anion gap 11 5 - 15  Prepare RBC     Status: None   Collection Time: 09/24/18  6:35 AM  Result Value Ref Range   Order Confirmation      NO CURRENT SAMPLE, MUST ORDER TYPE AND SCREEN ORDER PROCESSED BY BLOOD BANK Performed at [facility], [address] [place], [place] [address]   Glucose, capillary     Status: Abnormal   Collection Time: 09/24/18  7:46 AM  Result Value Ref Range   Glucose-Capillary 160 (H) 70 - 99 mg/dL  Type and screen     Status: None (Preliminary result)   Collection Time: 09/24/18 10:01 AM  Result Value Ref Range   ABO/RH(D) B POS    Antibody  Screen NEG    Sample Expiration 09/27/2018,2359    Unit Number [id number]    Blood Component Type RED CELLS,LR    Unit division 00    Status of Unit ISSUED    Transfusion Status OK TO TRANSFUSE    Crossmatch Result      Compatible Performed at [facility], [address] [place], [place] [address]   Glucose, capillary     Status: Abnormal   Collection Time: 09/24/18 11:43 AM  Result Value Ref Range   Glucose-Capillary 152 (H) 70 - 99 mg/dL    No results found.  Assessment/Plan: 6 Days Post-Op   Active Problems:   Sepsis ([place])   Acute renal failure ([place])   Septic shock [facility])  Patient treatment by medical specialties.  Continue IV antibiotics per infectious disease.  Orthopaedics will continue to follow.    Thornton Park , MD 09/24/2018, 2:29 PM

## 2018-09-24 NOTE — Progress Notes (Signed)
[place] [facility] [place] NAME: Isaac Cross    MR#:  [id number]  DATE OF BIRTH:  February 19, 1961  SUBJECTIVE:   -  Appears critically ill, very confused -T-max is improved    -Renal function worsening, decreased urine output-now started on HD  REVIEW OF SYSTEMS:  Review of Systems  Unable to perform ROS: Critical illness   DRUG ALLERGIES:   Allergies  Allergen Reactions  . Lipitor [Atorvastatin] Hives and Itching  . Other   . Viagra  [Sildenafil Citrate]     vision loss    VITALS:  Blood pressure (!) 141/70, pulse 88, temperature 97.8 F (36.6 C), temperature source Oral, resp. rate 18, height 6\' 4"  (1.93 m), weight (!) 151.4 kg, SpO2 96 %.  PHYSICAL EXAMINATION:  Physical Exam   GENERAL:  58 y.o.-year-old patient lying in the bed with no acute distress. Critically ill appearing EYES: Pupils equal, round, reactive to light and accommodation. No scleral icterus.  HEENT: Head atraumatic, normocephalic. Oropharynx and nasopharynx clear.  NECK:  Supple, no jugular venous distention. No thyroid enlargement, no tenderness.  LUNGS: Normal breath sounds bilaterally, no wheezing, rales,rhonchi or crepitation. No use of accessory muscles of respiration.  Decreased bibasilar breath sounds CARDIOVASCULAR: S1, S2 normal. No murmurs, rubs, or gallops. left chest dialysis catheter noted ABDOMEN: Soft, nontender, nondistended. Bowel sounds present. No organomegaly or mass.  EXTREMITIES: Swelling of both legs, knees noted with recent surgery.  No  cyanosis, or clubbing.  myoclonic jerks noted Both hands are swollen as well. NEUROLOGIC: Cranial nerves II through XII are intact.  Extremely confused.  Not following commands.  Myoclonic jerks noted.  Withdrawing to pain. PSYCHIATRIC: The patient is alert and disoriented.  Intermittent confusion noted SKIN: No obvious rash, lesion, or ulcer.    LABORATORY PANEL:   CBC Recent Labs  Lab 09/24/18 0327   WBC 8.6  HGB 7.0*  HCT 22.3*  PLT 99*   ------------------------------------------------------------------------------------------------------------------  Chemistries  Recent Labs  Lab 09/23/18 0407 09/24/18 0327  NA 144 144  K 3.2* 3.1*  CL 109 105  CO2 23 28  GLUCOSE 104* 158*  BUN 60* 56*  CREATININE 9.18* 8.30*  CALCIUM 8.1* 7.6*  MG 2.2  --   AST 48*  --   ALT 28  --   ALKPHOS 59  --   BILITOT 0.5  --    ------------------------------------------------------------------------------------------------------------------  Cardiac Enzymes No results for input(s): TROPONINI in the last 168 hours. ------------------------------------------------------------------------------------------------------------------  RADIOLOGY:  No results found.  EKG:   Orders placed or performed during the hospital encounter of 09/12/18  . EKG 12-Lead  . EKG 12-Lead    ASSESSMENT AND PLAN:    EdwardGradyis a58 y.o.malewith a known history of arthritis, COPD not on home oxygen, hypertension, sleep apnea, non-insulin-dependent diabetes mellitus presents to hospital secondary to dizziness, weakness and a fall  1.Sepsis- with ongoing fevers. T max is improving now   -s/p Recent bilateral knee replacement surgeries as outpatient and now bilateral knee washouts done this adm. - fluid Cultures are negative so far. -  Fungal cultures negative -Appreciate ID consult.   -Patient on zosyn, doxycycline and anidulafungin to cover fungal orgms -Received IV albumin as well--now d/ced -Remains on stress dose steroids  2. Acute Oliguric renal failure-likely ATN from sepsis and hypotension - appreciate nephrology input. -Patient getting more encephalopathic.  Creatinine greater than 9, even though potassium is within normal limits, metabolic acidosis noted with low bicarb. -Hemodialysis started  09/22/2018 -Hold lisinopril and metformin and other nephrotoxins.     3. Acute  encephalopathy-secondary to metabolic causes, sepsis and uremia.   -  No focal deficits  4. Left popliteal vein DVT- was on heparin drip--platelets dropping--IV argatroban  5. Diabetes mellitus- sliding scale insulin and Lantus has been added while inpatient - insulin drip to be started today -Hold metformin given worsening renal failure.  6. DVT prophylaxis- argatroban drip  Physical therapy consult when more stable Patient is critically ill  CODE STATUS: Full code  TOTAL TIME TAKING CARE OF THIS PATIENT: 28 minutes.   POSSIBLE D/C IN ?? DAYS, DEPENDING ON CLINICAL CONDITION.   Isaac Cross M.D on 09/24/2018 at 2:38 PM  Between 7am to 6pm - Pager - [phone]  After 6pm go to [email or web address] - password [email or web address] [facility] [facility]  Office  [phone]  CC: Primary care physician; Steele Sizer, MD

## 2018-09-24 NOTE — Progress Notes (Signed)
Post HD Tx    09/24/18 1930  Vital Signs  Temp 98 F (36.7 C)  Temp Source Oral  Pulse Rate 89  Pulse Rate Source Monitor  Resp 19  BP (!) 164/89  BP Location Right Arm  Oxygen Therapy  SpO2 91 %  Pain Assessment  Pain Scale 0-10  Pain Score 0  Dialysis Weight  Weight (!) 153.7 kg  Type of Weight Post-Dialysis  Post-Hemodialysis Assessment  Rinseback Volume (mL) 250 mL  Dialyzer Clearance Lightly streaked  Duration of HD Treatment -hour(s) 2.5 hour(s)  Hemodialysis Intake (mL) 500 mL  UF Total -Machine (mL) 500 mL  Net UF (mL) 0 mL  Tolerated HD Treatment Yes  Hemodialysis Catheter Left Internal jugular Triple lumen Temporary  Placement Date/Time: 09/21/18 1100   Placed prior to admission: No  Time Out: Correct patient;Correct procedure;Correct site  Maximum sterile barrier precautions: Hand hygiene;Sterile gown;Cap;Sterile gloves;Large sterile sheet;Mask  Site Prep: Chlorh...  Site Condition No complications  Blue Lumen Status Flushed;Capped (Central line);Heparin locked  Red Lumen Status Flushed;Capped (Central line);Heparin locked  Purple Lumen Status Flushed  Catheter fill solution Heparin 1000 units/ml  Catheter fill volume (Arterial) 1.4 cc  Catheter fill volume (Venous) 1.4  Dressing Type Biopatch;Occlusive  Drainage Description None  Dressing Change Due 09/30/18  Post treatment catheter status Capped and Clamped

## 2018-09-25 DIAGNOSIS — Z960 Presence of urogenital implants: Secondary | ICD-10-CM

## 2018-09-25 DIAGNOSIS — D649 Anemia, unspecified: Secondary | ICD-10-CM

## 2018-09-25 DIAGNOSIS — Z992 Dependence on renal dialysis: Secondary | ICD-10-CM

## 2018-09-25 LAB — TYPE AND SCREEN
ABO/RH(D): B POS
Antibody Screen: NEGATIVE
Unit division: 0

## 2018-09-25 LAB — CBC
HCT: 25.4 % — ABNORMAL LOW (ref 39.0–52.0)
Hemoglobin: 8.2 g/dL — ABNORMAL LOW (ref 13.0–17.0)
MCH: 27.1 pg (ref 26.0–34.0)
MCHC: 32.3 g/dL (ref 30.0–36.0)
MCV: 83.8 fL (ref 80.0–100.0)
Platelets: 150 10*3/uL (ref 150–400)
RBC: 3.03 MIL/uL — ABNORMAL LOW (ref 4.22–5.81)
RDW: 15.4 % (ref 11.5–15.5)
WBC: 9.8 10*3/uL (ref 4.0–10.5)
nRBC: 0.2 % (ref 0.0–0.2)

## 2018-09-25 LAB — GLUCOSE, CAPILLARY
Glucose-Capillary: 229 mg/dL — ABNORMAL HIGH (ref 70–99)
Glucose-Capillary: 199 mg/dL — ABNORMAL HIGH (ref 70–99)
Glucose-Capillary: 203 mg/dL — ABNORMAL HIGH (ref 70–99)
Glucose-Capillary: 313 mg/dL — ABNORMAL HIGH (ref 70–99)
Glucose-Capillary: 227 mg/dL — ABNORMAL HIGH (ref 70–99)
Glucose-Capillary: 135 mg/dL — ABNORMAL HIGH (ref 70–99)

## 2018-09-25 LAB — APTT: aPTT: 59 seconds — ABNORMAL HIGH (ref 24–36)

## 2018-09-25 LAB — COMPREHENSIVE METABOLIC PANEL
ALT: 37 U/L (ref 0–44)
AST: 56 U/L — ABNORMAL HIGH (ref 15–41)
Albumin: 2 g/dL — ABNORMAL LOW (ref 3.5–5.0)
Alkaline Phosphatase: 64 U/L (ref 38–126)
Anion gap: 15 (ref 5–15)
BUN: 52 mg/dL — ABNORMAL HIGH (ref 6–20)
CO2: 24 mmol/L (ref 22–32)
Calcium: 7.7 mg/dL — ABNORMAL LOW (ref 8.9–10.3)
Chloride: 103 mmol/L (ref 98–111)
Creatinine, Ser: 7.57 mg/dL — ABNORMAL HIGH (ref 0.61–1.24)
GFR calc Af Amer: 8 mL/min — ABNORMAL LOW (ref >60)
GFR calc non Af Amer: 7 mL/min — ABNORMAL LOW (ref >60)
Glucose, Bld: 234 mg/dL — ABNORMAL HIGH (ref 70–99)
Potassium: 3.4 mmol/L — ABNORMAL LOW (ref 3.5–5.1)
Sodium: 142 mmol/L (ref 135–145)
Total Bilirubin: 0.7 mg/dL (ref 0.3–1.2)
Total Protein: 6 g/dL — ABNORMAL LOW (ref 6.5–8.1)

## 2018-09-25 LAB — BPAM RBC
Blood Product Expiration Date: 202007032359
ISSUE DATE / TIME: 202006071411
Unit Type and Rh: 7300

## 2018-09-25 LAB — PHOSPHORUS: Phosphorus: 4.5 mg/dL (ref 2.5–4.6)

## 2018-09-25 LAB — MAGNESIUM: Magnesium: 1.9 mg/dL (ref 1.7–2.4)

## 2018-09-25 LAB — HEPARIN INDUCED PLATELET AB (HIT ANTIBODY): Heparin Induced Plt Ab: 1.854 OD — ABNORMAL HIGH (ref 0.000–0.400)

## 2018-09-25 MED ORDER — IPRATROPIUM-ALBUTEROL 0.5-2.5 (3) MG/3ML IN SOLN
3.0000 mL | Freq: Four times a day (QID) | RESPIRATORY_TRACT | Status: DC | PRN
  Administered 2018-09-25: 3 mL via RESPIRATORY_TRACT
  Filled 2018-09-25: qty 3

## 2018-09-25 MED ORDER — INSULIN GLARGINE 100 UNIT/ML ~~LOC~~ SOLN: 20.0000 [IU] | Freq: Every day | SUBCUTANEOUS | Status: DC

## 2018-09-25 MED ORDER — INSULIN GLARGINE 100 UNIT/ML ~~LOC~~ SOLN
15.0000 [IU] | Freq: Every day | SUBCUTANEOUS | Status: DC
  Administered 2018-09-25 – 2018-09-27 (×2): 15 [IU] via SUBCUTANEOUS
  Filled 2018-09-25 (×3): qty 0.15

## 2018-09-25 MED ORDER — ALUM & MAG HYDROXIDE-SIMETH 200-200-20 MG/5ML PO SUSP: 30.0000 mL | ORAL | Status: DC | PRN

## 2018-09-25 MED ORDER — SODIUM CHLORIDE 0.9% FLUSH: 10.0000 mL | INTRAVENOUS | Status: DC | PRN

## 2018-09-25 MED ORDER — SODIUM CHLORIDE 0.9% FLUSH
10.0000 mL | Freq: Two times a day (BID) | INTRAVENOUS | Status: DC
  Administered 2018-09-26: 20 mL
  Administered 2018-09-27 (×2): 10 mL
  Administered 2018-09-28: 30 mL
  Administered 2018-09-30 – 2018-10-03 (×4): 10 mL

## 2018-09-25 NOTE — Progress Notes (Signed)
Inpatient Diabetes Program Recommendations  AACE/ADA: New Consensus Statement on Inpatient Glycemic Control   Target Ranges:  Prepandial:   less than 140 mg/dL      Peak postprandial:   less than 180 mg/dL (1-2 hours)      Critically ill patients:  140 - 180 mg/dL   Results for Isaac Cross, Isaac Cross (MRN [id number]) as of 09/25/2018 09:00  Ref. Range 09/24/2018 07:46 09/24/2018 11:43 09/24/2018 17:01 09/24/2018 20:16 09/25/2018 00:05 09/25/2018 04:15 09/25/2018 07:43  Glucose-Capillary Latest Ref Range: 70 - 99 mg/dL 160 (H) 152 (H) 247 (H) 147 (H) 199 (H) 229 (H) 203 (H)   Review of Glycemic Control  Current orders for Inpatient glycemic control: Novolog 0-20 units Q4H; Solucortef 50 mg Q6H  Inpatient Diabetes Program Recommendations:   Insulin - Basal: If Solucortef is continued as ordered, please consider ordering Lantus 10 units Q24H.  Thanks, Barnie Alderman, RN, MSN, CDE Diabetes Coordinator Inpatient Diabetes Program [phone] (Team Pager from 8am to 5pm)

## 2018-09-25 NOTE — Evaluation (Signed)
Physical Therapy Re-Evaluation Patient Details Name: Isaac Cross MRN: [id number] DOB: [birth year] Today's Date: 09/25/2018   History of Present Illness  Pt is a 58 y.o. male presenting to hospital 09/12/18 after rolling OOB landing on L knee; pt with increased dizziness x2 days, fevers x3 days, and generalized weakness.  S/p B TKR 09/01/18 in [place] (pt reports discharging home same day).  Pt admitted with sepsis, acute renal failure, and B TKR.  Pt s/p L knee 5/31 and R knee 6/1 I&D with poly exchange.  Pt noted with severe encephalopathy from DT's with extensive ETOH abuse.  L LE DVT noted.  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  HD started 6/5 with L IJ temporary HD catheter.  PMH includes B TKR 09/01/18 in [place], DM, asthma, R vein surgery, COPD.  Clinical Impression  New PT consult received so PT re-evaluation performed.  Pt tolerated LE ex's in bed fairly well but required assist for L LE SLR (pt able to perform R LE SLR independently).  R knee ROM 0-90 degrees and L knee flexion to 90 degrees (all AROM) but L knee extension 8 degrees short of neutral.  Pt reporting minimal pain during session.  Although pt oriented x4, confusion noted during session with impaired insight and difficulties with problems solving.  Able to perform semi-supine to/from sit with SBA (further mobility deferred d/t temporary IJ HD catheter and also safety concerns (with pt on argatroban IV)).  Will continue to focus on strengthening, knee ROM, and progressive functional mobility as appropriate during hospital stay.  PT goals reviewed and updated as appropriate.  May benefit from CIR but will continue to monitor pt's progress and update PT recommendations as appropriate.    Follow Up Recommendations CIR    Equipment Recommendations  Rolling walker with 5" wheels;3in1 (PT)    Recommendations for Other Services OT consult     Precautions / Restrictions Precautions Precautions: Fall Precaution Comments: L IJ  temporary HD catheter Restrictions Weight Bearing Restrictions: Yes RLE Weight Bearing: Weight bearing as tolerated LLE Weight Bearing: Weight bearing as tolerated Other Position/Activity Restrictions: WBAT B LE's      Mobility  Bed Mobility Overal bed mobility: Needs Assistance Bed Mobility: Supine to Sit;Sit to Supine     Supine to sit: Supervision Sit to supine: Supervision   General bed mobility comments: Able to sit onto edge of bed and then lay back down into bed with increased time to process sequencing  Transfers                 General transfer comment: Mobility deferred d/t temporary IJ HD catheter and also safety concerns (with pt on argatroban IV).  Ambulation/Gait             General Gait Details: Mobility deferred d/t temporary IJ HD catheter and also safety concerns (with pt on argatroban IV).  Stairs            Wheelchair Mobility    Modified Rankin (Stroke Patients Only)       Balance Overall balance assessment: Needs assistance Sitting-balance support: No upper extremity supported;Feet supported Sitting balance-Leahy Scale: Fair Sitting balance - Comments: steady static sitting                                     Pertinent Vitals/Pain Pain Assessment: 0-10 Pain Score: 0-No pain Pain Location: B knee, worse in L knee Pain Descriptors /  Indicators: Sore Pain Intervention(s): Limited activity within patient's tolerance;Monitored during session;Repositioned  Vitals (HR and O2 on room air) stable and WFL throughout treatment session.    Home Living Family/patient expects to be discharged to:: Private residence Living Arrangements: Spouse/significant other Available Help at Discharge: Family Type of Home: House Home Access: Stairs to enter Entrance Stairs-Rails: Psychiatric nurse of Steps: 2 Home Layout: Two level;Able to live on main level with bedroom/bathroom Home Equipment: Gilford Rile - 2  wheels;Toilet riser;Grab bars - tub/shower;Bedside commode;Shower seat - built in;Hand held shower head;Adaptive equipment      Prior Function Level of Independence: Independent with assistive device(s)         Comments: Ambulatory with RW.  Pt reports doing well s/p B TKR.     Hand Dominance   Dominant Hand: Right    Extremity/Trunk Assessment   Upper Extremity Assessment Upper Extremity Assessment: Defer to OT evaluation    Lower Extremity Assessment Lower Extremity Assessment: Defer to PT evaluation;Generalized weakness RLE Deficits / Details: at least 3/5 AROM hip flexion, knee flexion/extension, and DF/PF LLE Deficits / Details: at least 3/5 AROM hip flexion, knee flexion/extension, and DF/PF    Cervical / Trunk Assessment Cervical / Trunk Assessment: Normal  Communication   Communication: No difficulties  Cognition Arousal/Alertness: Awake/alert Behavior During Therapy: WFL for tasks assessed/performed Overall Cognitive Status: Impaired/Different from baseline Area of Impairment: Following commands;Safety/judgement;Awareness;Problem solving                       Following Commands: Follows multi-step commands inconsistently Safety/Judgement: Decreased awareness of safety;Decreased awareness of deficits Awareness: Anticipatory Problem Solving: Slow processing;Decreased initiation;Difficulty sequencing;Requires verbal cues General Comments: A&O x4      General Comments General comments (skin integrity, edema, etc.): Pt initially appearing to shake laying in bed but once pt started doing activity no more of this noted.    Exercises Total Joint Exercises Ankle Circles/Pumps: AROM;Strengthening;Both;10 reps;Supine Quad Sets: AROM;Strengthening;Both;10 reps;Supine Heel Slides: AAROM;Strengthening;Both;10 reps;Supine Hip ABduction/ADduction: AAROM;Strengthening;Both;10 reps;Supine Straight Leg Raises: AAROM;Left;AROM;Right;Strengthening;10  reps;Supine Goniometric ROM: B knees AROM flexion to 90 degrees sitting edge of bed; R knee AROM extension to neutral; L knee AROM extension 8 degrees short of neutral   Assessment/Plan    PT Assessment Patient needs continued PT services  PT Problem List Decreased strength;Decreased range of motion;Decreased activity tolerance;Decreased balance;Decreased mobility;Decreased knowledge of use of DME;Decreased knowledge of precautions;Pain;Decreased skin integrity;Decreased cognition;Decreased safety awareness       PT Treatment Interventions DME instruction;Gait training;Stair training;Functional mobility training;Therapeutic activities;Therapeutic exercise;Balance training;Patient/family education;Cognitive remediation    PT Goals (Current goals can be found in the Care Plan section)  Acute Rehab PT Goals Patient Stated Goal: to go home PT Goal Formulation: With patient Time For Goal Achievement: 10/13/18 Potential to Achieve Goals: Fair    Frequency 7X/week   Barriers to discharge        Co-evaluation               AM-PAC PT "6 Clicks" Mobility  Outcome Measure Help needed turning from your back to your side while in a flat bed without using bedrails?: A Little Help needed moving from lying on your back to sitting on the side of a flat bed without using bedrails?: A Little Help needed moving to and from a bed to a chair (including a wheelchair)?: A Little Help needed standing up from a chair using your arms (e.g., wheelchair or bedside chair)?: A Little Help needed to walk in hospital  room?: A Lot Help needed climbing 3-5 steps with a railing? : A Lot 6 Click Score: 16    End of Session Equipment Utilized During Treatment: Gait belt Activity Tolerance: Patient tolerated treatment well Patient left: in bed;with call bell/phone within reach;with bed alarm set;Other (comment)(B heels elevated via towel rolls; polar care in place L Knee and activated) Nurse Communication:  Mobility status;Precautions;Weight bearing status PT Visit Diagnosis: Other abnormalities of gait and mobility (R26.89);Muscle weakness (generalized) (M62.81);Difficulty in walking, not elsewhere classified (R26.2);Pain Pain - Right/Left: Left Pain - part of body: Knee    Time: 1520-1555 PT Time Calculation (min) (ACUTE ONLY): 35 min   Charges:   PT Evaluation $PT Re-evaluation: 1 Re-eval PT Treatments $Therapeutic Exercise: 8-22 mins       Leitha Bleak, PT 09/25/18, 4:37 PM [phone]

## 2018-09-25 NOTE — Progress Notes (Signed)
[place] at [place] NAME: Isaac Cross    MR#:  [id number]  DATE OF BIRTH:  Mar 17, 1961  SUBJECTIVE:   - more alert and conversive -T-max is improved    -Renal function worsening, decreased urine output-now started on HD  REVIEW OF SYSTEMS:  Review of Systems  Unable to perform ROS: Critical illness   DRUG ALLERGIES:   Allergies  Allergen Reactions  . Lipitor [Atorvastatin] Hives and Itching  . Other   . Viagra  [Sildenafil Citrate]     vision loss    VITALS:  Blood pressure 138/88, pulse (!) 103, temperature 98.4 F (36.9 C), temperature source Oral, resp. rate 20, height 6\' 4"  (1.93 m), weight (!) 153.7 kg, SpO2 97 %.  PHYSICAL EXAMINATION:  Physical Exam   GENERAL:  58 y.o.-year-old patient lying in the bed with no acute distress. Critically ill appearing EYES: Pupils equal, round, reactive to light and accommodation. No scleral icterus.  HEENT: Head atraumatic, normocephalic. Oropharynx and nasopharynx clear.  NECK:  Supple, no jugular venous distention. No thyroid enlargement, no tenderness.  LUNGS: Normal breath sounds bilaterally, no wheezing, rales,rhonchi or crepitation. No use of accessory muscles of respiration.  Decreased bibasilar breath sounds CARDIOVASCULAR: S1, S2 normal. No murmurs, rubs, or gallops. left chest dialysis catheter noted ABDOMEN: Soft, nontender, nondistended. Bowel sounds present. No organomegaly or mass.  EXTREMITIES: Swelling of both legs, knees noted with recent surgery.  No  cyanosis, or clubbing.  myoclonic jerks noted Both hands are swollen as well. NEUROLOGIC: Cranial nerves II through XII are intact. Moving all extremities well PSYCHIATRIC: The patient is alert and oriented x2 SKIN: No obvious rash, lesion, or ulcer.    LABORATORY PANEL:   CBC Recent Labs  Lab 09/25/18 0444  WBC 9.8  HGB 8.2*  HCT 25.4*  PLT 150    ------------------------------------------------------------------------------------------------------------------  Chemistries  Recent Labs  Lab 09/25/18 0444  NA 142  K 3.4*  CL 103  CO2 24  GLUCOSE 234*  BUN 52*  CREATININE 7.57*  CALCIUM 7.7*  MG 1.9  AST 56*  ALT 37  ALKPHOS 64  BILITOT 0.7   ------------------------------------------------------------------------------------------------------------------  Cardiac Enzymes No results for input(s): TROPONINI in the last 168 hours. ------------------------------------------------------------------------------------------------------------------  RADIOLOGY:  No results found.  EKG:   Orders placed or performed during the hospital encounter of 09/12/18  . EKG 12-Lead  . EKG 12-Lead    ASSESSMENT AND PLAN:    EdwardGradyis a58 y.o.malewith a known history of arthritis, COPD not on home oxygen, hypertension, sleep apnea, non-insulin-dependent diabetes mellitus presents to hospital secondary to dizziness, weakness and a fall  1.Sepsis- with ongoing fevers. T max is improving now   -s/p Recent bilateral knee replacement surgeries as outpatient and now bilateral knee washouts done this adm. - fluid Cultures are negative so far. - Fungal cultures negative -Appreciate ID consult.   -Patient on zosyn, doxycycline and anidulafungin -Received IV albumin as well--now d/ced -now off IV stress dose steroids  2. Acute Oliguric renal failure-likely ATN from sepsis and hypotension - appreciate nephrology input. -Hemodialysis started 09/22/2018 -Hold lisinopril and metformin and other nephrotoxins.     3. Acute encephalopathy-secondary to metabolic causes, sepsis and uremia.   -  No focal deficits -improving mentation  4. Left popliteal vein DVT- was on heparin drip--platelets dropping--IV argatroban platelet count normal -HIT pending  5. Diabetes mellitus- sliding scale insulin and Lantus has been added while  inpatient - start lantus and cont  ssi -Hold metformin given worsening renal failure.  6. DVT prophylaxis- argatroban drip  Physical therapy  To be started  CODE STATUS: Full code  TOTAL TIME TAKING CARE OF THIS PATIENT: 28 minutes.   POSSIBLE D/C IN ?? DAYS, DEPENDING ON CLINICAL CONDITION.   Fritzi Mandes M.D on 09/25/2018 at 3:31 PM  Between 7am to 6pm - Pager - [phone] After 6pm go to [email or web address] - password [email or web address] [facility] [facility]  Office  [phone]  CC: Primary care physician; Steele Sizer, MD

## 2018-09-25 NOTE — TOC Progression Note (Signed)
Transition of Care [facility]) - Progression Note    Patient Details  Name: JSHON IBE MRN: [id number] Date of Birth: [birth year]  Transition of Care [facility]) CM/SW Young, RN Phone Number: 09/25/2018, 2:44 PM  Clinical Narrative:    Touched base with Estill Bamberg with [employer], She is following the patient at this time for inpatient dialysis and will continue to monitor for any outpatient needs, Disposition is unable to be determined at this time. Will continue to Monitor for needs   Expected Discharge Plan: [place] Barriers to Discharge: Continued Medical Work up  Expected Discharge Plan and Services Expected Discharge Plan: [place]   Discharge Planning Services: CM Consult Post Acute Care Choice: [place] arrangements for the past 2 months: Single Family Home(Condo)                             [facility] Date [facility] Agency Contacted: 09/18/18 Time [place]: [place] Representative spoke with at [place]: [name]- unsure of when patient will discharge.   Social Determinants of Health (SDOH) Interventions    Readmission Risk Interventions No flowsheet data found.

## 2018-09-25 NOTE — Consult Note (Signed)
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  Patient Measurements: Height: 6\' 4"  (193 cm) Weight: (!) 338 lb 13.6 oz (153.7 kg) IBW/kg (Calculated) : 86.8  Vital Signs: Temp: 98.8 F (37.1 C) (06/07 2015) Temp Source: Oral (06/07 2015) BP: 158/84 (06/08 0500) Pulse Rate: 74 (06/08 0500)  Labs: Recent Labs    09/22/18 1220  09/22/18 2306 09/23/18 0407 09/23/18 0620 09/23/18 1207  09/23/18 2013 09/24/18 0327 09/25/18 0444 09/25/18 0540  HGB  --    < >  --  7.7*  --   --   --   --  7.0* 8.2*  --   HCT  --    < >  --  23.8*  --   --   --   --  22.3* 25.4*  --   PLT  --    < >  --  102*  --   --   --   --  99* 150  --   APTT  --   --   --   --   --   --    < > 63* 60*  --  59*  HEPARINUNFRC 0.17*  --  0.41  --  0.47  --   --   --   --   --   --   CREATININE  --    < >  --  9.18*  --   --   --   --  8.30* 7.57*  --   CKTOTAL  --   --   --   --   --  100  --   --   --   --   --    < > = values in this interval not displayed.    Estimated Creatinine Clearance: 16.7 mL/min (A) (by C-G formula based on SCr of 7.57 mg/dL (H)).   Medical History: Past Medical History:  Diagnosis Date  . Allergy   . Anxiety   . Arthritis    knees  . Asthma, mild intermittent, well-controlled   . Chronic obstructive asthma ([place])   . COPD (chronic obstructive pulmonary disease) ([place])   . Depression with anxiety   . Diabetes ([place])   . GERD (gastroesophageal reflux disease)   . HBP (high blood pressure)   . High cholesterol   . Renal cyst, right   . Sleep apnea    CPAP  . Swelling     Medications:  Scheduled:  . sodium chloride   Intravenous Once  . sodium chloride   Intravenous Once  . aspirin EC  81 mg Oral Daily  . budesonide (PULMICORT) nebulizer solution  0.5 mg Nebulization BID  . Chlorhexidine Gluconate Cloth  6 each Topical Q0600  . feeding supplement (NEPRO CARB STEADY)  237 mL Oral BID BM  . folic acid  1 mg  Intravenous Daily  . hydrocortisone sod succinate (SOLU-CORTEF) inj  50 mg Intravenous Q6H  . insulin aspart  0-20 Units Subcutaneous Q4H  . ipratropium-albuterol  3 mL Nebulization Q6H  . mouth rinse  15 mL Mouth Rinse BID  . multivitamin  1 tablet Oral QHS  . multivitamin with minerals  1 tablet Oral Daily  . sodium chloride flush  3 mL Intravenous Q12H  . thiamine injection  100 mg Intravenous Daily  . tranexamic acid (CYKLOKAPRON) topical -INTRAOP  2,000 mg Topical Once    Assessment: 58 yr male had B/l knee replacements Sep 01, 2018 was  admitted with fever, AKI/ Platelet count has been declining since 6/5. He was started on heparin 6/3. Child Pugh score estimated to be 5-6  Baseline aPTT: 35s 09/23/18@1525 : aPTT 91s 09/23/18@1823 : aPTT 64s 09/23/18@2013 : aPTT 63s 06/07 @ 0330 aPTT 60 s    Goal of Therapy:  aPTT 50-90 seconds Monitor platelets by anticoagulation protocol: Yes   Plan:   0608 @ 0540 aPTT 59. Level remains therapeutic. Will continue current argatroban rate @ 0.5 mcg/kg/min. Continue to monitor CBC and recheck aPTT w/ am labs  Pernell Dupre, PharmD, BCPS Clinical Pharmacist 09/25/2018 6:26 AM

## 2018-09-25 NOTE — Progress Notes (Signed)
Updated patients wife at this time.

## 2018-09-25 NOTE — Progress Notes (Signed)
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  CC  follow up sepsis  SUBJECTIVE Encephalopathy and DT's improving On argatroban +pain PT  pending    CBC    Component Value Date/Time   WBC 9.8 09/25/2018 0444   RBC 3.03 (L) 09/25/2018 0444   HGB 8.2 (L) 09/25/2018 0444   HCT 25.4 (L) 09/25/2018 0444   PLT 150 09/25/2018 0444   MCV 83.8 09/25/2018 0444   MCH 27.1 09/25/2018 0444   MCHC 32.3 09/25/2018 0444   RDW 15.4 09/25/2018 0444   LYMPHSABS 1.1 09/23/2018 0407   MONOABS 1.2 (H) 09/23/2018 0407   EOSABS 0.0 09/23/2018 0407   BASOSABS 0.0 09/23/2018 0407   BMP Latest Ref Rng & Units 09/25/2018 09/24/2018 09/23/2018  Glucose 70 - 99 mg/dL 234(H) 158(H) 104(H)  BUN 6 - 20 mg/dL 52(H) 56(H) 60(H)  Creatinine 0.61 - 1.24 mg/dL 7.57(H) 8.30(H) 9.18(H)  BUN/Creat Ratio 6 - 22 (calc) - - -  Sodium 135 - 145 mmol/L 142 144 144  Potassium 3.5 - 5.1 mmol/L 3.4(L) 3.1(L) 3.2(L)  Chloride 98 - 111 mmol/L 103 105 109  CO2 22 - 32 mmol/L 24 28 23   Calcium 8.9 - 10.3 mg/dL 7.7(L) 7.6(L) 8.1(L)      BP (!) 155/86   Pulse 88   Temp 98.8 F (37.1 C) (Oral)   Resp 16   Ht 6\' 4"  (1.93 m)   Wt (!) 153.7 kg   SpO2 95%   BMI 41.25 kg/m    I/O last 3 completed shifts: In: 2642.8 [P.O.:240; I.V.:753.4; Blood:760; IV Piggyback:889.4] Out: 620 [Urine:620] No intake/output data recorded.  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 and started argatroban 6/8 DT resolving, plt count 150, on argatroban, CPAP for OSA   Review of Systems:  Gen:  Denies  fever, sweats, chills weigh loss  HEENT: Denies blurred vision, double vision, ear pain, eye pain, hearing loss, nose bleeds, sore throat Cardiac:  No dizziness, chest pain or heaviness, chest tightness,edema, No JVD Resp:   No cough, -sputum production, -shortness of breath,-wheezing, -hemoptysis,  Gi: Denies swallowing difficulty, stomach pain, nausea or vomiting, diarrhea, constipation, bowel incontinence Gu:  Denies bladder incontinence, burning urine Ext:   Knee incisions intact no pus  Other:  All other systems negative   GENERAL:slighly confused HEAD: Normocephalic, atraumatic.  EYES: Pupils equal, round, reactive to light.  No scleral icterus.  MOUTH: Moist mucosal membrane. NECK: Supple. No thyromegaly. No nodules. No JVD.  PULMONARY:CTA b/l CARDIOVASCULAR: S1 and S2. Regular rate and rhythm. No murmurs, rubs, or gallops.  GASTROINTESTINAL: Soft, nontender, -distended. No masses. Positive bowel sounds. No hepatosplenomegaly.  MUSCULOSKELETAL: + edema.  NEUROLOGIC: lethargic but arousable SKIN:intact,warm,dry  MEDICATIONS: I have reviewed all medications and confirmed regimen as documented   CULTURE RESULTS   Recent Results (from the past 240 hour(s))  Group A Strep by PCR  Status: None   Collection Time: 09/15/18  1:53 PM  Result Value Ref Range Status   Group A Strep by PCR NOT DETECTED NOT DETECTED Final    Comment: Performed at [facility], [address] [place], [place] [address]  Body fluid culture     Status: None   Collection Time: 09/15/18  5:13 PM  Result Value Ref Range Status   Specimen Description   Final    KNEE Performed at [facility], [address] [place], [place] [address]    Special Requests   Final    NONE Performed at  [facility], [place] [address] [place], [place] [address]    Gram Stain   Final    RARE RBCS FEW WBC SEEN NO ORGANISMS SEEN Performed at [facility], [address], [place], [place] [address]    Culture   Final    NO GROWTH 3 DAYS Performed at [facility], [place] [address], [place], [place] [address]    Report Status 09/19/2018 FINAL  Final  Body fluid culture     Status: None   Collection Time: 09/16/18  8:55 AM  Result Value Ref Range Status   Specimen Description KNEE RIGHT  Final   Special Requests NONE  Final   Gram Stain   Final    RARE WBC PRESENT, PREDOMINANTLY PMN NO ORGANISMS SEEN    Culture   Final    NO GROWTH 3 DAYS Performed at [facility], [place] [address], [place], [place] [address]    Report Status 09/20/2018 FINAL  Final  Aerobic/Anaerobic Culture (surgical/deep wound)     Status: None   Collection Time: 09/17/18 11:32 AM  Result Value Ref Range Status   Specimen Description   Final    TISSUE LEFT KNEE 1 Performed at [facility], [place] [place], [place] [address]    Special Requests PATIENT ON FOLLOWING MAXIPIME VANCOMYCIN  Final   Gram Stain   Final    FEW WBC PRESENT,BOTH PMN AND MONONUCLEAR NO ORGANISMS SEEN    Culture   Final    No growth aerobically or anaerobically. Performed at [facility], [place] [address], [place], [place] [address]    Report Status 09/22/2018 FINAL  Final  Acid Fast Smear (AFB)     Status: None   Collection Time: 09/17/18 11:32 AM  Result Value Ref Range Status   AFB Specimen Processing Comment  Final    Comment: Tissue Grinding and Digestion/Decontamination   Acid Fast Smear Negative  Final    Comment: (NOTE) Performed At: [facility] [place], [place] [id number] Rush Farmer MD WN:[id number]    Source (AFB) TISSUE  Final    Comment: LEFT KNEE Performed at [facility], [place] [address] [place], [place] [address]   Culture,  fungus without smear     Status: None (Preliminary result)   Collection Time: 09/17/18 11:34 AM  Result Value Ref Range Status   Specimen Description   Final    SYNOVIAL Performed at [facility], [address] [place], [place] [address]    Special Requests   Final    NONE Performed at [facility], [address], [place], [place] [address]    Culture   Final    NO FUNGUS ISOLATED AFTER 2 DAYS Performed at [facility], [place] [address] [place],  [address]    Report Status PENDING  Incomplete  Acid Fast Culture with reflexed sensitivities     Status: None   Collection Time: 09/17/18 11:36 AM  Result Value Ref Range Status   Acid Fast Culture CANC  Final    Comment: (NOTE) Test cancelled at client's request.      Cancelled per [place] Erp 09/21/2018 Performed At: [facility] [place], [place] [id number] Rush Farmer MD EX:[id number]    Source of Sample TISSUE  Final    Comment: RIGHT KNEE Performed at [facility], [place] [address], [place], [place] [address]   Aerobic/Anaerobic Culture (surgical/deep wound)     Status: None   Collection Time: 09/17/18 11:43 AM  Result Value Ref Range Status   Specimen Description   Final    TISSUE LEFT KNEE 2 Performed at [facility], [address] [place], [place] [address]    Special Requests PATIENT ON FOLLOWING MAXIPIME VANCOMYCIN  Final   Gram Stain   Final    FEW WBC PRESENT, PREDOMINANTLY PMN NO ORGANISMS SEEN    Culture   Final    No growth aerobically or anaerobically. Performed at [facility], [place] [address] [place], [place] [address]    Report Status 09/22/2018 FINAL  Final  Aerobic/Anaerobic Culture (surgical/deep wound)     Status: None   Collection Time: 09/17/18 11:44 AM  Result Value Ref Range Status   Specimen Description   Final    TISSUE LEFT KNEE 3 Performed at [facility], [place] [place], [place]  [address]    Special Requests PATIENT ON FOLLOWING MAXIPIME VANCOMYCIN  Final   Gram Stain   Final    MODERATE WBC PRESENT, PREDOMINANTLY PMN NO ORGANISMS SEEN    Culture   Final    No growth aerobically or anaerobically. Performed at [facility], [place] [address] [place], [place] [address]    Report Status 09/22/2018 FINAL  Final  Aerobic/Anaerobic Culture (surgical/deep wound)     Status: None   Collection Time: 09/18/18  9:43 PM  Result Value Ref Range Status   Specimen Description   Final    SYNOVIAL Performed at [facility], [address], [place], [place] [address]    Special Requests   Final    NONE Performed at [facility], [place] [place], [place] [address]    Gram Stain   Final    RARE WBC PRESENT, PREDOMINANTLY MONONUCLEAR NO ORGANISMS SEEN    Culture   Final    No growth aerobically or anaerobically. Performed at [facility], [place] [address], [place], [place] [address]    Report Status 09/24/2018 FINAL  Final  Aerobic/Anaerobic Culture (surgical/deep wound)     Status: None   Collection Time: 09/18/18  9:44 PM  Result Value Ref Range Status   Specimen Description   Final    SYNOVIAL Performed at [facility], [address], [place], [place] [address]    Special Requests   Final    RIGHT KNEE Performed at [facility], [place], [place] [address]    Gram Stain   Final    FEW WBC PRESENT,BOTH PMN AND MONONUCLEAR NO ORGANISMS SEEN    Culture   Final    No growth aerobically or anaerobically. Performed at [facility], [place] [address] [place], [place] [address]    Report Status 09/24/2018 FINAL  Final  Aerobic/Anaerobic Culture (surgical/deep wound)  Status: None   Collection Time: 09/18/18  9:45 PM  Result Value Ref Range Status   Specimen Description   Final    SYNOVIAL Performed at [facility], [place] [place], [place] [address]    Special  Requests   Final    RIGHT KNEE Performed at [facility], [place] [place], [place] [address]    Gram Stain   Final    FEW WBC PRESENT, PREDOMINANTLY MONONUCLEAR NO ORGANISMS SEEN    Culture   Final    No growth aerobically or anaerobically. Performed at [facility], [place] [address], [place], [place] [address]    Report Status 09/24/2018 FINAL  Final  Culture, blood (Routine X 2) w Reflex to ID Panel     Status: None   Collection Time: 09/19/18  1:11 PM  Result Value Ref Range Status   Specimen Description BLOOD LEFT ANTECUBITAL  Final   Special Requests   Final    BOTTLES DRAWN AEROBIC AND ANAEROBIC Blood Culture results may not be optimal due to an excessive volume of blood received in culture bottles   Culture   Final    NO GROWTH 5 DAYS Performed at [facility], [place] [place], [place] [address]    Report Status 09/24/2018 FINAL  Final  Culture, blood (Routine X 2) w Reflex to ID Panel     Status: None   Collection Time: 09/19/18  1:20 PM  Result Value Ref Range Status   Specimen Description BLOOD BLOOD RIGHT HAND  Final   Special Requests   Final    BOTTLES DRAWN AEROBIC AND ANAEROBIC Blood Culture results may not be optimal due to an excessive volume of blood received in culture bottles   Culture   Final    NO GROWTH 5 DAYS Performed at [facility], [address] [place], [place] [address]    Report Status 09/24/2018 FINAL  Final  Urine Culture     Status: None   Collection Time: 09/20/18  3:59 PM  Result Value Ref Range Status   Specimen Description   Final    URINE, RANDOM Performed at [facility], [address] [place], [place] [address]    Special Requests   Final    NONE Performed at [facility], [address], [place], [place] [address]    Culture   Final    NO GROWTH Performed at [facility], [place] [address] [place], [place] [address]    Report Status 09/21/2018 FINAL   Final  C difficile quick scan w PCR reflex     Status: None   Collection Time: 09/21/18  3:27 PM  Result Value Ref Range Status   C Diff antigen NEGATIVE NEGATIVE Final   C Diff toxin NEGATIVE NEGATIVE Final   C Diff interpretation No C. difficile detected.  Final    Comment: Performed at [facility], [place] [place],  [address]  Gastrointestinal Panel by PCR , Stool     Status: None   Collection Time: 09/21/18  3:27 PM  Result Value Ref Range Status   Campylobacter species NOT DETECTED NOT DETECTED Final   Plesimonas shigelloides NOT DETECTED NOT DETECTED Final   Salmonella species NOT DETECTED NOT DETECTED Final   Yersinia enterocolitica NOT DETECTED NOT DETECTED Final   Vibrio species NOT DETECTED NOT DETECTED Final   Vibrio cholerae NOT DETECTED NOT DETECTED Final   Enteroaggregative E coli (EAEC)  NOT DETECTED NOT DETECTED Final   Enteropathogenic E coli (EPEC) NOT DETECTED NOT DETECTED Final   Enterotoxigenic E coli (ETEC) NOT DETECTED NOT DETECTED Final   Shiga like toxin producing E coli (STEC) NOT DETECTED NOT DETECTED Final   Shigella/Enteroinvasive E coli (EIEC) NOT DETECTED NOT DETECTED Final   Cryptosporidium NOT DETECTED NOT DETECTED Final   Cyclospora cayetanensis NOT DETECTED NOT DETECTED Final   Entamoeba histolytica NOT DETECTED NOT DETECTED Final   Giardia lamblia NOT DETECTED NOT DETECTED Final   Adenovirus F40/41 NOT DETECTED NOT DETECTED Final   Astrovirus NOT DETECTED NOT DETECTED Final   Norovirus GI/GII NOT DETECTED NOT DETECTED Final   Rotavirus A NOT DETECTED NOT DETECTED Final   Sapovirus (I, II, IV, and V) NOT DETECTED NOT DETECTED Final    Comment: Performed at [facility], [address] [place], [place] [address]            Indwelling Urinary Catheter continued, requirement due to   Reason to continue Indwelling Urinary Catheter strict Intake/Output monitoring for hemodynamic instability   Central Line/  continued, requirement due to  Reason to continue [place] of central venous pressure or other hemodynamic parameters and poor IV access        ASSESSMENT AND PLAN SYNOPSIS  SEVERE SEPSIS FROM LEFT KNEE INFECTION INFECTIOUS DISEASE LEFT KNEE SEPTIC ARTHRITIS-slowly improving -continue antibiotics as prescribed -follow up cultures -follow up ID consultation CULTURES NEG   ACUTE KIDNEY INJURY/Renal Failure On HD   SEVERE ALCOHOL WITHDRAWAL-slowly improving -Therapy with Thiamine and MVI -CIWA Protocol  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  [facility]  Medical Director [place] Director [place] Department

## 2018-09-25 NOTE — Evaluation (Signed)
Occupational Therapy Evaluation Patient Details Name: Isaac Cross MRN: [id number] DOB: 1961-02-27 Today's Date: 09/25/2018    History of Present Illness Pt is a 58 y.o. male presenting to hospital 09/12/18 after rolling OOB landing on L knee; pt with increased dizziness x2 days, fevers x3 days, and generalized weakness.  S/p B TKR 09/01/18 in [place] (pt reports discharging home same day).  Pt admitted with sepsis, acute renal failure, and B TKR.  PMH includes B TKR 09/01/18 in [place], DM, asthma, R vein surgery, COPD.   Clinical Impression   [name]") was seen for OT evaluation this date. Prior to hospital admission, Pt was independent in all ADLs using a RW for mobility 2/2 recent Bilat. TKR sx. Pt is eager to return to PLOF with less pain and improved safety and independence. Pt currently requires mod assist for LB ADL tasks while in seated position due to pain and limited AROM of both knees. Pt also demonstrated generalized weakness in BUE limiting his ability to complete UB ADLs including grooming and UB bathing/dressing. Pt instructed falls prevention strategies including use of his hospital call bell, continued use of hospital non-skid socks, safe use of AE for mobility, and pet mgt strategies to minimize falls risk upon return home. Assessment/treatment limited on this date as pt had temporary IJ triple lumen at time of evaluation. Activity restrictions were presumed, however, Dr. Holley Raring in at end of evaluation and cleared pt for OOB activity during future therapy sessions. Pt would benefit from skilled OT services including additional instruction in dressing techniques with or without assistive devices for dressing and bathing skills to support recall and carryover prior to discharge and ultimately to maximize safety, independence, and minimize falls risk and caregiver burden. Recommend STR upon hospital DC.    Follow Up Recommendations  SNF    Equipment Recommendations  None  recommended by OT    Recommendations for Other Services       Precautions / Restrictions Precautions Precautions: Fall Precaution Comments: R IJ CVC Restrictions Weight Bearing Restrictions: Yes RLE Weight Bearing: Weight bearing as tolerated LLE Weight Bearing: Weight bearing as tolerated Other Position/Activity Restrictions: WBAT per MD Harlow Mares note 09/12/18      Mobility Bed Mobility               General bed mobility comments: Mobility deferred d/t temporary IJ triple lumen.   Transfers                 General transfer comment: Mobility deferred d/t temporary IJ triple lumen.     Balance                                           ADL either performed or assessed with clinical judgement   ADL Overall ADL's : Needs assistance/impaired Eating/Feeding: Set up;Sitting   Grooming: Minimal assistance;Set up;Sitting   Upper Body Bathing: Set up;Minimal assistance;Sitting   Lower Body Bathing: Set up;Moderate assistance;Sitting/lateral leans   Upper Body Dressing : Set up;Minimal assistance;Sitting   Lower Body Dressing: Set up;Moderate assistance;Sit to/from stand   Toilet Transfer: Set up;Moderate assistance;Stand-pivot;BSC;RW   Toileting- Clothing Manipulation and Hygiene: Set up;Minimal assistance;Sit to/from stand         General ADL Comments: Assessment limited to bed level activity 2/2 to temporary IJ triple lumen. Pt also notably fatigued, shiverying t/o session despite extra blankets provided. RN notified.  Vision Baseline Vision/History: Wears glasses Wears Glasses: At all times Patient Visual Report: No change from baseline       Perception     Praxis      Pertinent Vitals/Pain Pain Assessment: 0-10 Pain Score: 6  Pain Location: B knee, worse in L knee Pain Descriptors / Indicators: Sore Pain Intervention(s): Limited activity within patient's tolerance;Monitored during session;Patient requesting pain meds-RN  notified     Hand Dominance Right   Extremity/Trunk Assessment Upper Extremity Assessment Upper Extremity Assessment: Generalized weakness(Grip WFL, Noted drift in LUE. Assessment limited 2/2 Temporary IJ. )   Lower Extremity Assessment Lower Extremity Assessment: Defer to PT evaluation;Generalized weakness   Cervical / Trunk Assessment Cervical / Trunk Assessment: Normal   Communication Communication Communication: No difficulties   Cognition Arousal/Alertness: Awake/alert Behavior During Therapy: WFL for tasks assessed/performed Overall Cognitive Status: Within Functional Limits for tasks assessed                                 General Comments: Pt A&O x 4.    General Comments  Assessment limited 2/2 temporary IJ triple lumen. (in place at start and end of session). Pt also notably fatigued and shiverying/shaking t/o evaluation stated he was cold despite extra blankets provided. RN notified.     Exercises Other Exercises Other Exercises: Pt educated on falls prevention strategies both in and out of the hospital, including use of his call bell and waiting for assistance for any OOB activity, use of appropriate footwear, safe use of AE for mobility and ADL, as well as pet mgt. Pt verbalized understanding, but would benefit from additional reinforcement of education provided.    Shoulder Instructions      Home Living Family/patient expects to be discharged to:: Private residence Living Arrangements: Spouse/significant other Available Help at Discharge: Family Type of Home: House Home Access: Stairs to enter [employer] of Steps: 2 Entrance Stairs-Rails: Right;Left Home Layout: Two level;Able to live on main level with bedroom/bathroom     Bathroom Shower/Tub: Walk-in shower   Bathroom Toilet: Standard(With riser)     Home Equipment: Environmental consultant - 2 wheels;Toilet riser;Grab bars - tub/shower;Bedside commode;Shower seat - built in;Hand held shower  head;Adaptive equipment Adaptive Equipment: Reacher;Long-handled shoe horn;Long-handled sponge        Prior Functioning/Environment Level of Independence: Independent with assistive device(s)        Comments: Ambulatory with RW.  Pt reports doing well s/p B TKR.        OT Problem List: Decreased strength;Pain;Decreased knowledge of precautions;Decreased range of motion;Decreased safety awareness;Cardiopulmonary status limiting activity;Obesity;Decreased activity tolerance;Decreased coordination;Decreased knowledge of use of DME or AE;Impaired UE functional use      OT Treatment/Interventions: Self-care/ADL training;Therapeutic exercise;Patient/family education;Balance training;Energy conservation;Therapeutic activities;DME and/or AE instruction    OT Goals(Current goals can be found in the care plan section) Acute Rehab OT Goals Patient Stated Goal: to go home OT Goal Formulation: With patient Time For Goal Achievement: 10/09/18 Potential to Achieve Goals: Good ADL Goals Pt Will Perform Lower Body Bathing: sit to/from stand;with adaptive equipment;with min assist(With LRAD for improved safety and functional independence.) Pt Will Perform Lower Body Dressing: with min assist;with adaptive equipment;sit to/from stand(With LRAD for improved safety and functional independence.) Pt Will Transfer to Toilet: ambulating;bedside commode;with supervision(With LRAD for improved safety and functional independence.)  OT Frequency: Min 1X/week   Barriers to D/C: Inaccessible home environment  Pt with steps to enter the home.  Co-evaluation              AM-PAC OT "6 Clicks" Daily Activity     Outcome Measure Help from another person eating meals?: None Help from another person taking care of personal grooming?: A Little Help from another person toileting, which includes using toliet, bedpan, or urinal?: A Lot Help from another person bathing (including washing, rinsing,  drying)?: A Lot Help from another person to put on and taking off regular upper body clothing?: A Little Help from another person to put on and taking off regular lower body clothing?: A Lot 6 Click Score: 16   End of Session Nurse Communication: Other (comment)(Pt continues to be cold after getting blankets, shivering. )  Activity Tolerance: Patient tolerated treatment well Patient left: in bed;with call bell/phone within reach;with bed alarm set;with SCD's reapplied;Other (comment)(With polar care in place. )  OT Visit Diagnosis: Other abnormalities of gait and mobility (R26.89);History of falling (Z91.81);Pain Pain - Right/Left: (Both) Pain - part of body: Knee                Time: 6294-7654 OT Time Calculation (min): 32 min Charges:  OT General Charges $OT Visit: 1 Visit OT Evaluation $OT Eval Moderate Complexity: 1 Mod OT Treatments $Self Care/Home Management : 23-37 mins  Shara Blazing, M.S., OTR/L Ascom: [phone] 09/25/18, 2:38 PM

## 2018-09-25 NOTE — Progress Notes (Signed)
[facility]  ROUNDING NOTE   Subjective:  Patient transition to floor care today. Remains oliguric at the moment. Resting comfortably in bed at the moment.    Objective:  Vital signs in last 24 hours:  Temp:  [97.7 F (36.5 C)-98.8 F (37.1 C)] 98.4 F (36.9 C) (06/08 1633) Pulse Rate:  [66-117] 103 (06/08 1633) Resp:  [12-23] 20 (06/08 1154) BP: (119-172)/(64-145) 133/82 (06/08 1633) SpO2:  [89 %-100 %] 99 % (06/08 1633) Weight:  [153.7 kg] 153.7 kg (06/07 1930)  Weight change: 3.3 kg Filed Weights   09/23/18 1300 09/24/18 1640 09/24/18 1930  Weight: (!) 151.4 kg (!) 153.7 kg (!) 153.7 kg    Intake/Output: I/O last 3 completed shifts: In: 2676.9 [P.O.:240; I.V.:762.4; Blood:760; IV Piggyback:914.5] Out: 620 [Urine:620]   Intake/Output this shift:  Total I/O In: 509.6 [P.O.:480; I.V.:12.9; IV Piggyback:16.6] Out: 90 [Urine:90]  Physical Exam: General: NAD  Head: Dry oral mucosal membranes  Eyes: Anicteric   Lungs:  Scatterd rhonchi, normal effort  Heart: S1S2 no rubs  Abdomen:  Soft, nontender, obese, distended  Extremities: + peripheral edema., b/l knee dressing  Neurologic: Awake, alert, conversant  Skin: warm        Basic Metabolic Panel: Recent Labs  Lab 09/20/18 0500 09/21/18 0120  09/22/18 1640 09/22/18 2013 09/23/18 0407 09/24/18 0327 09/25/18 0444  NA 137 139   < > 142 142 144 144 142  K 3.9 4.3   < > 3.1* 3.2* 3.2* 3.1* 3.4*  CL 107 111   < > 107 107 109 105 103  CO2 19* 17*   < > 20* 22 23 28 24   GLUCOSE 174* 192*   < > 258* 222* 104* 158* 234*  BUN 30* 37*   < > 54* 55* 60* 56* 52*  CREATININE 5.98* 7.73*   < > 8.11* 8.89* 9.18* 8.30* 7.57*  CALCIUM 8.2* 8.3*   < > 8.0* 7.9* 8.1* 7.6* 7.7*  MG 2.0 2.4  --   --  2.2 2.2  --  1.9  PHOS  --   --   --   --  4.0 4.1 3.7 4.5   < > = values in this interval not displayed.    Liver Function Tests: Recent Labs  Lab 09/19/18 0439 09/20/18 0505 09/22/18 0411 09/23/18 0407  09/24/18 0327 09/25/18 0444  AST 70* 64* 44* 48*  --  56*  ALT 43 35 27 28  --  37  ALKPHOS 71 67 62 59  --  64  BILITOT 0.6 0.7 0.8 0.5  --  0.7  PROT 6.0* 6.1* 6.1* 6.4*  --  6.0*  ALBUMIN 1.9* 1.8* 1.8* 1.9* 1.9* 2.0*   No results for input(s): LIPASE, AMYLASE in the last 168 hours. Recent Labs  Lab 09/20/18 0948 09/22/18 2014  AMMONIA 12 10    CBC: Recent Labs  Lab 09/19/18 0439  09/21/18 0120  09/22/18 0411 09/22/18 0700 09/22/18 2013 09/23/18 0407 09/24/18 0327 09/25/18 0444  WBC 11.2*   < > 11.1*  --  10.3  --  9.5 9.9 8.6 9.8  NEUTROABS 6.4  --  8.0*  --  8.5*  --   --  7.5  --   --   HGB 9.0*   < > 8.7*   < > 7.8* 7.8* 7.7* 7.7* 7.0* 8.2*  HCT 28.4*   < > 28.0*   < > 24.7* 24.5* 23.9* 23.8* 22.3* 25.4*  MCV 85.3   < > 86.2  --  84.3  --  83.3 82.4 84.2 83.8  PLT 269   < > 276  --  124*  --  107* 102* 99* 150   < > = values in this interval not displayed.    Cardiac Enzymes: Recent Labs  Lab 09/19/18 1312 09/20/18 0948 09/23/18 1207  CKTOTAL 716* 992* 100    BNP: Invalid input(s): POCBNP  CBG: Recent Labs  Lab 09/25/18 0005 09/25/18 0415 09/25/18 0743 09/25/18 1231 09/25/18 1636  GLUCAP 199* 229* 203* 313* 227*    Microbiology: Results for orders placed or performed during the hospital encounter of 09/12/18  Blood Culture (routine x 2)     Status: None   Collection Time: 09/12/18  9:56 AM  Result Value Ref Range Status   Specimen Description BLOOD LEFT HAND  Final   Special Requests   Final    BOTTLES DRAWN AEROBIC AND ANAEROBIC Blood Culture adequate volume   Culture   Final    NO GROWTH 5 DAYS Performed at [facility], [address] [place], [place] [address]    Report Status 09/17/2018 FINAL  Final  Urine culture     Status: None   Collection Time: 09/12/18 10:01 AM  Result Value Ref Range Status   Specimen Description   Final    URINE, RANDOM Performed at [facility], [address] [place],  [place] [address]    Special Requests   Final    NONE Performed at [facility], [address], [place], [place] [address]    Culture   Final    NO GROWTH Performed at [facility], [place] [address] [place], [place] [address]    Report Status 09/13/2018 FINAL  Final  SARS Coronavirus 2 (CEPHEID- Performed in [facility] lab), Hosp Order     Status: None   Collection Time: 09/12/18 10:02 AM  Result Value Ref Range Status   SARS Coronavirus 2 NEGATIVE NEGATIVE Final    Comment: (NOTE) If result is NEGATIVE SARS-CoV-2 target nucleic acids are NOT DETECTED. The SARS-CoV-2 RNA is generally detectable in upper and lower  respiratory specimens during the acute phase of infection. The lowest  concentration of SARS-CoV-2 viral copies this assay can detect is 250  copies / mL. A negative result does not preclude SARS-CoV-2 infection  and should not be used as the sole basis for treatment or other  patient management decisions.  A negative result may occur with  improper specimen collection / handling, submission of specimen other  than nasopharyngeal swab, presence of viral mutation(s) within the  areas targeted by this assay, and inadequate number of viral copies  (<250 copies / mL). A negative result must be combined with clinical  observations, patient history, and epidemiological information. If result is POSITIVE SARS-CoV-2 target nucleic acids are DETECTED. The SARS-CoV-2 RNA is generally detectable in upper and lower  respiratory specimens dur ing the acute phase of infection.  Positive  results are indicative of active infection with SARS-CoV-2.  Clinical  correlation with patient history and other diagnostic information is  necessary to determine patient infection status.  Positive results do  not rule out bacterial infection or co-infection with other viruses. If result is PRESUMPTIVE POSTIVE SARS-CoV-2 nucleic acids MAY BE PRESENT.   A presumptive positive  result was obtained on the submitted specimen  and confirmed on repeat testing.  While 2019 novel coronavirus  (SARS-CoV-2) nucleic acids may be present in the submitted sample  additional confirmatory testing  may be necessary for epidemiological  and / or clinical management purposes  to differentiate between  SARS-CoV-2 and other Sarbecovirus currently known to infect humans.  If clinically indicated additional testing with an alternate test  methodology [phone]) is advised. The SARS-CoV-2 RNA is generally  detectable in upper and lower respiratory sp ecimens during the acute  phase of infection. The expected result is Negative. Fact Sheet for Patients:  StrictlyIdeas.no Fact Sheet for Healthcare Providers: BankingDealers.co.za This test is not yet approved or cleared by the [place] FDA and has been authorized for detection and/or diagnosis of SARS-CoV-2 by FDA under an Emergency Use Authorization (EUA).  This EUA will remain in effect (meaning this test can be used) for the duration of the COVID-19 declaration under Section 564(b)(1) of the Act, 21 U.S.C. section 360bbb-3(b)(1), unless the authorization is terminated or revoked sooner. Performed at [facility], [address] [place], [place] [address]   Blood Culture (routine x 2)     Status: None   Collection Time: 09/12/18 10:06 AM  Result Value Ref Range Status   Specimen Description BLOOD LEFT ANTECUBITAL  Final   Special Requests   Final    BOTTLES DRAWN AEROBIC AND ANAEROBIC Blood Culture adequate volume   Culture   Final    NO GROWTH 5 DAYS Performed at [facility], [address] [place], [place] [address]    Report Status 09/17/2018 FINAL  Final  MRSA PCR Screening     Status: None   Collection Time: 09/12/18  7:04 PM  Result Value Ref Range Status   MRSA by PCR NEGATIVE NEGATIVE Final    Comment:        The GeneXpert MRSA Assay  (FDA approved for NASAL specimens only), is one component of a comprehensive MRSA colonization surveillance program. It is not intended to diagnose MRSA infection nor to guide or monitor treatment for MRSA infections. Performed at [facility], [address] [place], [place] [address]   C difficile quick scan w PCR reflex     Status: None   Collection Time: 09/14/18  2:21 PM  Result Value Ref Range Status   C Diff antigen NEGATIVE NEGATIVE Final   C Diff toxin NEGATIVE NEGATIVE Final   C Diff interpretation No C. difficile detected.  Final    Comment: Performed at [facility], [address] [place], [place] [address]  Group A Strep by PCR     Status: None   Collection Time: 09/15/18  1:53 PM  Result Value Ref Range Status   Group A Strep by PCR NOT DETECTED NOT DETECTED Final    Comment: Performed at [facility], [address] [place], [place] [address]  Body fluid culture     Status: None   Collection Time: 09/15/18  5:13 PM  Result Value Ref Range Status   Specimen Description   Final    KNEE Performed at [facility], [address], [place], [place] [address]    Special Requests   Final    NONE Performed at [facility], [address], [place], [place] [address]    Gram Stain   Final    RARE RBCS FEW WBC SEEN NO ORGANISMS SEEN Performed at [facility], [address] [place], [place] [address]    Culture   Final    NO GROWTH 3 DAYS Performed at [facility], [place] [address] [place], [place] [address]    Report  Status 09/19/2018 FINAL  Final  Body fluid culture     Status: None   Collection Time: 09/16/18  8:55 AM  Result Value Ref Range Status   Specimen Description KNEE RIGHT  Final   Special Requests NONE  Final   Gram Stain   Final    RARE WBC PRESENT, PREDOMINANTLY PMN NO ORGANISMS SEEN    Culture   Final    NO GROWTH 3 DAYS Performed at [facility], [place] [address] [place], [place] [address]    Report Status 09/20/2018 FINAL  Final  Aerobic/Anaerobic Culture (surgical/deep wound)     Status: None   Collection Time: 09/17/18 11:32 AM  Result Value Ref Range Status   Specimen Description   Final    TISSUE LEFT KNEE 1 Performed at [facility], [address] [place], [place] [address]    Special Requests PATIENT ON FOLLOWING MAXIPIME VANCOMYCIN  Final   Gram Stain   Final    FEW WBC PRESENT,BOTH PMN AND MONONUCLEAR NO ORGANISMS SEEN    Culture   Final    No growth aerobically or anaerobically. Performed at [facility], [place] [address], [place], [place] [address]    Report Status 09/22/2018 FINAL  Final  Acid Fast Smear (AFB)     Status: None   Collection Time: 09/17/18 11:32 AM  Result Value Ref Range Status   AFB Specimen Processing Comment  Final    Comment: Tissue Grinding and Digestion/Decontamination   Acid Fast Smear Negative  Final    Comment: (NOTE) Performed At: [facility] [place], [place] [id number] Rush Farmer MD [id number]    Source (AFB) TISSUE  Final    Comment: LEFT KNEE Performed at [facility], [place] [address] [place], [place] [address]   Culture, fungus without smear     Status: None (Preliminary result)   Collection Time: 09/17/18 11:34 AM  Result Value Ref Range Status   Specimen Description   Final    SYNOVIAL Performed at [facility], [address], [place], [place] [address]    Special Requests   Final    NONE Performed at [facility], [address], [place], [place] [address]    Culture   Final    NO FUNGUS ISOLATED AFTER 3 DAYS Performed at [facility], [place] [address] [place], [place] [address]    Report Status PENDING  Incomplete  Acid Fast Culture with reflexed sensitivities     Status: None   Collection Time: 09/17/18 11:36 AM  Result Value Ref Range Status   Acid Fast Culture CANC  Final    Comment:  (NOTE) Test cancelled at client's request.      Cancelled per Levin Erp 09/21/2018 Performed At: [facility] [place], [place] [id number] Rush Farmer MD PX:[id number]    Source of Sample TISSUE  Final    Comment: RIGHT KNEE Performed at [facility], [place] [address], [place], [place] [address]   Aerobic/Anaerobic Culture (surgical/deep wound)     Status: None   Collection Time: 09/17/18 11:43 AM  Result Value Ref Range Status   Specimen Description   Final    TISSUE LEFT KNEE 2 Performed at [facility], [address] [place],  [address]    Special Requests PATIENT ON FOLLOWING MAXIPIME VANCOMYCIN  Final   Gram Stain   Final    FEW WBC PRESENT, PREDOMINANTLY PMN NO  ORGANISMS SEEN    Culture   Final    No growth aerobically or anaerobically. Performed at [facility], [place] [address] [place], [place] [address]    Report Status 09/22/2018 FINAL  Final  Aerobic/Anaerobic Culture (surgical/deep wound)     Status: None   Collection Time: 09/17/18 11:44 AM  Result Value Ref Range Status   Specimen Description   Final    TISSUE LEFT KNEE 3 Performed at [facility], [address] [place], [place] [address]    Special Requests PATIENT ON FOLLOWING MAXIPIME VANCOMYCIN  Final   Gram Stain   Final    MODERATE WBC PRESENT, PREDOMINANTLY PMN NO ORGANISMS SEEN    Culture   Final    No growth aerobically or anaerobically. Performed at [facility], [place] [address], [place], [place] [address]    Report Status 09/22/2018 FINAL  Final  Aerobic/Anaerobic Culture (surgical/deep wound)     Status: None   Collection Time: 09/18/18  9:43 PM  Result Value Ref Range Status   Specimen Description   Final    SYNOVIAL Performed at [facility], [address] [place], [place] [address]    Special Requests   Final    NONE Performed at [facility], [address] [place], [place] [address]     Gram Stain   Final    RARE WBC PRESENT, PREDOMINANTLY MONONUCLEAR NO ORGANISMS SEEN    Culture   Final    No growth aerobically or anaerobically. Performed at [facility], [place] [address] [place], [place] [address]    Report Status 09/24/2018 FINAL  Final  Aerobic/Anaerobic Culture (surgical/deep wound)     Status: None   Collection Time: 09/18/18  9:44 PM  Result Value Ref Range Status   Specimen Description   Final    SYNOVIAL Performed at [facility], [address] [place], [place] [address]    Special Requests   Final    RIGHT KNEE Performed at [facility], [place], [place] [address]    Gram Stain   Final    FEW WBC PRESENT,BOTH PMN AND MONONUCLEAR NO ORGANISMS SEEN    Culture   Final    No growth aerobically or anaerobically. Performed at [facility], [place] [address] [place], [place] [address]    Report Status 09/24/2018 FINAL  Final  Aerobic/Anaerobic Culture (surgical/deep wound)     Status: None   Collection Time: 09/18/18  9:45 PM  Result Value Ref Range Status   Specimen Description   Final    SYNOVIAL Performed at [facility], [address], [place], [place] [address]    Special Requests   Final    RIGHT KNEE Performed at [facility], [address] [place], [place] [address]    Gram Stain   Final    FEW WBC PRESENT, PREDOMINANTLY MONONUCLEAR NO ORGANISMS SEEN    Culture   Final    No growth aerobically or anaerobically. Performed at [facility], [place] [address], [place], [place] [address]    Report Status 09/24/2018 FINAL  Final  Culture, blood (Routine X 2) w Reflex to ID Panel     Status: None   Collection Time: 09/19/18  1:11 PM  Result Value Ref Range Status   Specimen Description BLOOD LEFT ANTECUBITAL  Final   Special Requests   Final    BOTTLES DRAWN AEROBIC AND ANAEROBIC Blood  Culture results may not be optimal due to an excessive volume of blood received in  culture bottles   Culture   Final    NO GROWTH 5 DAYS Performed at [facility], [address] [place], [place] [address]    Report Status 09/24/2018 FINAL  Final  Culture, blood (Routine X 2) w Reflex to ID Panel     Status: None   Collection Time: 09/19/18  1:20 PM  Result Value Ref Range Status   Specimen Description BLOOD BLOOD RIGHT HAND  Final   Special Requests   Final    BOTTLES DRAWN AEROBIC AND ANAEROBIC Blood Culture results may not be optimal due to an excessive volume of blood received in culture bottles   Culture   Final    NO GROWTH 5 DAYS Performed at [facility], [address], [place], [place] [address]    Report Status 09/24/2018 FINAL  Final  Urine Culture     Status: None   Collection Time: 09/20/18  3:59 PM  Result Value Ref Range Status   Specimen Description   Final    URINE, RANDOM Performed at [facility], [address] [place], [place] [address]    Special Requests   Final    NONE Performed at [facility], [address], [place], [place] [address]    Culture   Final    NO GROWTH Performed at [facility], [place] [address] [place], [place] [address]    Report Status 09/21/2018 FINAL  Final  C difficile quick scan w PCR reflex     Status: None   Collection Time: 09/21/18  3:27 PM  Result Value Ref Range Status   C Diff antigen NEGATIVE NEGATIVE Final   C Diff toxin NEGATIVE NEGATIVE Final   C Diff interpretation No C. difficile detected.  Final    Comment: Performed at [facility] [address] [place], [place] [address]  Gastrointestinal Panel by PCR , Stool     Status: None   Collection Time: 09/21/18  3:27 PM  Result Value Ref Range Status   Campylobacter species NOT DETECTED NOT DETECTED Final   Plesimonas shigelloides NOT DETECTED NOT DETECTED Final   Salmonella species NOT DETECTED NOT DETECTED Final   Yersinia enterocolitica NOT DETECTED NOT DETECTED Final   Vibrio  species NOT DETECTED NOT DETECTED Final   Vibrio cholerae NOT DETECTED NOT DETECTED Final   Enteroaggregative E coli (EAEC) NOT DETECTED NOT DETECTED Final   Enteropathogenic E coli (EPEC) NOT DETECTED NOT DETECTED Final   Enterotoxigenic E coli (ETEC) NOT DETECTED NOT DETECTED Final   Shiga like toxin producing E coli (STEC) NOT DETECTED NOT DETECTED Final   Shigella/Enteroinvasive E coli (EIEC) NOT DETECTED NOT DETECTED Final   Cryptosporidium NOT DETECTED NOT DETECTED Final   Cyclospora cayetanensis NOT DETECTED NOT DETECTED Final   Entamoeba histolytica NOT DETECTED NOT DETECTED Final   Giardia lamblia NOT DETECTED NOT DETECTED Final   Adenovirus F40/41 NOT DETECTED NOT DETECTED Final   Astrovirus NOT DETECTED NOT DETECTED Final   Norovirus GI/GII NOT DETECTED NOT DETECTED Final   Rotavirus A NOT DETECTED NOT DETECTED Final   Sapovirus (I, II, IV, and V) NOT DETECTED NOT DETECTED Final    Comment: Performed at [facility], [address] [place], [place] [address]    Coagulation Studies: No results for input(s): LABPROT, INR in the last 72 hours.  Urinalysis: No results for input(s): COLORURINE, LABSPEC, [place],  GLUCOSEU, HGBUR, BILIRUBINUR, KETONESUR, PROTEINUR, UROBILINOGEN, NITRITE, LEUKOCYTESUR in the last 72 hours.  Invalid input(s): APPERANCEUR    Imaging: No results found.   Medications:   . sodium chloride Stopped (09/22/18 2221)  . sodium chloride    . anidulafungin Stopped (09/23/18 1736)  . argatroban 0.5 mcg/kg/min (09/25/18 1000)  . doxycycline (VIBRAMYCIN) IV 100 mg (09/25/18 1040)  . piperacillin-tazobactam (ZOSYN)  IV Stopped (09/25/18 0819)   . aspirin EC  81 mg Oral Daily  . budesonide (PULMICORT) nebulizer solution  0.5 mg Nebulization BID  . Chlorhexidine Gluconate Cloth  6 each Topical Q0600  . feeding supplement (NEPRO CARB STEADY)  237 mL Oral BID BM  . folic acid  1 mg Intravenous Daily  . insulin aspart  0-20 Units Subcutaneous  Q4H  . insulin glargine  15 Units Subcutaneous Daily  . mouth rinse  15 mL Mouth Rinse BID  . multivitamin  1 tablet Oral QHS  . sodium chloride flush  3 mL Intravenous Q12H   sodium chloride, sodium chloride, acetaminophen **OR** acetaminophen, albuterol, fentaNYL (SUBLIMAZE) injection, heparin, HYDROcodone-acetaminophen, iohexol, ipratropium-albuterol, menthol-cetylpyridinium **OR** phenol, menthol-cetylpyridinium **OR** phenol, metoCLOPramide **OR** metoCLOPramide (REGLAN) injection, ondansetron **OR** ondansetron (ZOFRAN) IV, sodium chloride flush  Assessment/ Plan:  [name] is a 58 y.o. black male with hyeprtension, COPD, diabetes mellitus type II who underwent bilateral knee replacement surgeries last week. who was admitted to [facility] on 09/12/2018 for sepsis  1. Acute renal failure with metabolic acidosis  baseline creatinine of 1.07 with normal GFR in 06/12/18.  Likely severe ATN from hypotension and sepsis -Patient remains oliguric at the moment.  Urine output was only 420 cc over the preceding 24 hours.  We will consider dialysis again tomorrow if urine output remains low.  2. Septic shock/hypotension  -Recent bilateral knee replacement on Sep 01, 2018 at [facility]  -  knee septic arthritis, patient underwent I&D and polyethylene exchange on 5/31 and Rt knee on 6/1 -Currently being treated with anidulafungin, Doxy and Zosyn - dose adjustment as per pharmacist  3. Hypokalemia -Potassium currently 3.4.  Recheck tomorrow.       LOS: 13 Inmer Nix 6/8/20204:43 PM

## 2018-09-25 NOTE — Plan of Care (Signed)

## 2018-09-25 NOTE — Progress Notes (Signed)
Date of Admission:  09/12/2018      LDA HD catheter RT IJ  09/21/18  Foley 09/20/18  Doing better Out of ICU   Subjective: Doing better No fever X 96 hrs  Medications:  . sodium chloride   Intravenous Once  . sodium chloride   Intravenous Once  . aspirin EC  81 mg Oral Daily  . budesonide (PULMICORT) nebulizer solution  0.5 mg Nebulization BID  . Chlorhexidine Gluconate Cloth  6 each Topical Q0600  . feeding supplement (NEPRO CARB STEADY)  237 mL Oral BID BM  . folic acid  1 mg Intravenous Daily  . hydrocortisone sod succinate (SOLU-CORTEF) inj  50 mg Intravenous Q6H  . insulin aspart  0-20 Units Subcutaneous Q4H  . mouth rinse  15 mL Mouth Rinse BID  . multivitamin  1 tablet Oral QHS  . multivitamin with minerals  1 tablet Oral Daily  . sodium chloride flush  3 mL Intravenous Q12H  . thiamine injection  100 mg Intravenous Daily  . tranexamic acid (CYKLOKAPRON) topical -INTRAOP  2,000 mg Topical Once    Objective: Vital signs in last 24 hours: Temp:  [97.7 F (36.5 C)-98.8 F (37.1 C)] 98.8 F (37.1 C) (06/07 2015) Pulse Rate:  [66-107] 88 (06/08 0700) Resp:  [12-23] 16 (06/08 0700) BP: (133-172)/(70-145) 155/86 (06/08 0700) SpO2:  [90 %-100 %] 95 % (06/08 0700) Weight:  [153.7 kg] 153.7 kg (06/07 1930)  PHYSICAL EXAM:  General: Alert, cooperative, no distress, appears stated age.  Head: Normocephalic, without obvious abnormality, atraumatic. Eyes: Conjunctivae clear, anicteric sclerae. Pupils are equal ENT Nares normal. No drainage or sinus tenderness. Eschar nasal bridge Lips, mucosaN, and tongue coatedl. No Thrush Neck: Supple, symmetrical, no adenopathy, thyroid: non tender no carotid bruit and no JVD. Back: No CVA tenderness. Lungs: b/l air entry. Heart:s1s2 Abdomen: Soft, non-tender,not distended. Bowel sounds normal. No masses Extremities: edema legs left > rt B/l surgical site covered with dressing Skin: No rashes or lesions. Or bruising Lymph:  Cervical, supraclavicular normal. Neurologic: Grossly non-focal  Lab Results Recent Labs    09/24/18 0327 09/25/18 0444  WBC 8.6 9.8  HGB 7.0* 8.2*  HCT 22.3* 25.4*  NA 144 142  K 3.1* 3.4*  CL 105 103  CO2 28 24  BUN 56* 52*  CREATININE 8.30* 7.57*   Liver Panel Recent Labs    09/23/18 0407 09/24/18 0327 09/25/18 0444  PROT 6.4*  --  6.0*  ALBUMIN 1.9* 1.9* 2.0*  AST 48*  --  56*  ALT 28  --  37  ALKPHOS 59  --  64  BILITOT 0.5  --  0.7    Microbiology: [facility] 09/12/18-NG 09/19/18 BC NG 6/3 fungal culture neg so far Synovial fluid 09/15/18 Neg Synovial tissue culture 5/30 NG  Assessment/Plan: 57 yr male had B/l knee replacements Sep 01, 2018 was admitted with fever, AKI . He was taking bactrim and keflex when he came to the Ed on 09/12/18  Initially feverswere persisting inspite of IV vanco and cefepime and as there was no other source (Blood culture neg, urine culture neg, no pneumonia, ) the left knee was aspirated and it had 8000 wbc and he was taken for a wash out on 09/17/18. Rt knee was washed out on 09/18/18 All cultures neg Pts fever was down for 36-48 hrs and started up again on 09/19/18 at 103. Central line, femoral line were removed Cefepime was changed to zosyn on 6/2  Also his creatinine which on  admission was 3.71, then 4.3 was down to 2.46 on 5/31 and then started climbing up again and today is 5.98. Vanco was discontinued on 09/19/18 ( when it was 3.42)  Fever- no fever in 4 days  PJI of the left knee - and he had wash out and cultures neg No UTI, no bacteremia, no pneumonia Suspected Candidemia--added anidulafungin 6/3  and doxy 6/3- - he also was started on stress dose steroids on the same day and was also diagnosed with left popliteal DVT on 09/20/18 and started on heparin- So not sure which impacted the fever. As fungal blood culture neg so far but not finalized yet  and also beta d glucan negative - less likely candida-  stop anidulafungin tomorrow ( 7  days rx)  All cultures neg, even 16S RNA NGS negative- will need 4 weeks of IV ceftazidime or cefepime + vanco?? On discharge- will check with nephrologist whether vanco can be given or we can switch to Dapto  Encephalopathy? DT? Drug reaction like NMS/serotonin syndrome, compounded by worsening renal function resolved  High cpk around 4500 initially thought to be due to fall Declined to 717 and today increased to > 900.   Hypotension - resolved septic shock  AKI- started dialysis on 09/22/18  Thrombocytopenia  Platelet count Declining since 09/22/18- He was started on heparin 6/3 , zosyn on 6/2 R/o HIT Now on argatroban and platelet improving  Anemia  Discussed the management with the patient

## 2018-09-25 NOTE — Progress Notes (Addendum)
1000 hrs: Attempted to call report X 1 to RN from 1A; awaiting call back.   1050 hrs: Report given to 1A RN.

## 2018-09-26 DIAGNOSIS — J9600 Acute respiratory failure, unspecified whether with hypoxia or hypercapnia: Secondary | ICD-10-CM

## 2018-09-26 DIAGNOSIS — I82432 Acute embolism and thrombosis of left popliteal vein: Secondary | ICD-10-CM

## 2018-09-26 DIAGNOSIS — N1330 Unspecified hydronephrosis: Secondary | ICD-10-CM

## 2018-09-26 DIAGNOSIS — D7582 Heparin induced thrombocytopenia (HIT): Secondary | ICD-10-CM

## 2018-09-26 DIAGNOSIS — Z87891 Personal history of nicotine dependence: Secondary | ICD-10-CM

## 2018-09-26 DIAGNOSIS — G9341 Metabolic encephalopathy: Secondary | ICD-10-CM

## 2018-09-26 DIAGNOSIS — K7460 Unspecified cirrhosis of liver: Secondary | ICD-10-CM

## 2018-09-26 DIAGNOSIS — Z8042 Family history of malignant neoplasm of prostate: Secondary | ICD-10-CM

## 2018-09-26 DIAGNOSIS — M009 Pyogenic arthritis, unspecified: Secondary | ICD-10-CM

## 2018-09-26 DIAGNOSIS — T45515A Adverse effect of anticoagulants, initial encounter: Secondary | ICD-10-CM

## 2018-09-26 LAB — QUANTIFERON-TB GOLD PLUS (RQFGPL)
QuantiFERON Mitogen Value: 0.3 IU/mL
QuantiFERON Nil Value: 0.08 IU/mL
QuantiFERON TB1 Ag Value: 0.07 IU/mL
QuantiFERON TB2 Ag Value: 0.07 IU/mL

## 2018-09-26 LAB — CBC
HCT: 26.9 % — ABNORMAL LOW (ref 39.0–52.0)
Hemoglobin: 8.8 g/dL — ABNORMAL LOW (ref 13.0–17.0)
MCH: 27.3 pg (ref 26.0–34.0)
MCHC: 32.7 g/dL (ref 30.0–36.0)
MCV: 83.5 fL (ref 80.0–100.0)
Platelets: 227 10*3/uL (ref 150–400)
RBC: 3.22 MIL/uL — ABNORMAL LOW (ref 4.22–5.81)
RDW: 15.4 % (ref 11.5–15.5)
WBC: 10.4 10*3/uL (ref 4.0–10.5)
nRBC: 0 % (ref 0.0–0.2)

## 2018-09-26 LAB — RENAL FUNCTION PANEL
Albumin: 1.8 g/dL — ABNORMAL LOW (ref 3.5–5.0)
Anion gap: 13 (ref 5–15)
BUN: 66 mg/dL — ABNORMAL HIGH (ref 6–20)
CO2: 25 mmol/L (ref 22–32)
Calcium: 7.7 mg/dL — ABNORMAL LOW (ref 8.9–10.3)
Chloride: 102 mmol/L (ref 98–111)
Creatinine, Ser: 9.23 mg/dL — ABNORMAL HIGH (ref 0.61–1.24)
GFR calc Af Amer: 6 mL/min — ABNORMAL LOW (ref >60)
GFR calc non Af Amer: 6 mL/min — ABNORMAL LOW (ref >60)
Glucose, Bld: 100 mg/dL — ABNORMAL HIGH (ref 70–99)
Phosphorus: 4.4 mg/dL (ref 2.5–4.6)
Potassium: 2.9 mmol/L — ABNORMAL LOW (ref 3.5–5.1)
Sodium: 140 mmol/L (ref 135–145)

## 2018-09-26 LAB — APTT: aPTT: 55 seconds — ABNORMAL HIGH (ref 24–36)

## 2018-09-26 LAB — QUANTIFERON-TB GOLD PLUS: QuantiFERON-TB Gold Plus: UNDETERMINED — AB

## 2018-09-26 LAB — GLUCOSE, CAPILLARY
Glucose-Capillary: 69 mg/dL — ABNORMAL LOW (ref 70–99)
Glucose-Capillary: 95 mg/dL (ref 70–99)
Glucose-Capillary: 104 mg/dL — ABNORMAL HIGH (ref 70–99)
Glucose-Capillary: 93 mg/dL (ref 70–99)
Glucose-Capillary: 94 mg/dL (ref 70–99)
Glucose-Capillary: 142 mg/dL — ABNORMAL HIGH (ref 70–99)

## 2018-09-26 LAB — PROTIME-INR
INR: 2 — ABNORMAL HIGH (ref 0.8–1.2)
Prothrombin Time: 22.3 seconds — ABNORMAL HIGH (ref 11.4–15.2)

## 2018-09-26 LAB — CK: Total CK: 90 U/L (ref 49–397)

## 2018-09-26 MED ORDER — WARFARIN - PHARMACIST DOSING INPATIENT
Freq: Every day | Status: DC
  Administered 2018-09-28 – 2018-10-09 (×5)

## 2018-09-26 MED ORDER — WARFARIN SODIUM 5 MG PO TABS
5.0000 mg | ORAL_TABLET | Freq: Once | ORAL | Status: AC
  Administered 2018-09-26: 19:00:00 5 mg via ORAL
  Filled 2018-09-26: qty 1

## 2018-09-26 MED ORDER — POTASSIUM CHLORIDE 10 MEQ/100ML IV SOLN
10.0000 meq | Freq: Once | INTRAVENOUS | Status: AC
  Administered 2018-09-26: 10 meq via INTRAVENOUS

## 2018-09-26 MED ORDER — POTASSIUM CHLORIDE 10 MEQ/100ML IV SOLN
10.0000 meq | INTRAVENOUS | Status: AC
  Administered 2018-09-26 (×3): 10 meq via INTRAVENOUS
  Filled 2018-09-26 (×4): qty 100

## 2018-09-26 MED ORDER — ANTICOAGULANT SODIUM CITRATE 4% (200MG/5ML) IV SOLN
5.0000 mL | Status: DC | PRN
  Administered 2018-09-26 – 2018-09-29 (×2): 5 mL via INTRAVENOUS
  Filled 2018-09-26 (×2): qty 5

## 2018-09-26 NOTE — Progress Notes (Signed)
Pre HD Assessment    09/26/18 1035  Neurological  Level of Consciousness Alert  Orientation Level Oriented X4  Respiratory  Respiratory Pattern Regular  Chest Assessment Chest expansion symmetrical  Bilateral Breath Sounds Diminished;Coarse crackles  Cough None  Cardiac  Pulse Regular  Heart Sounds S1, S2  ECG Monitor Yes  Cardiac Rhythm ST  Vascular  R Radial Pulse +2  L Radial Pulse +2  Edema Generalized  Generalized Edema +2  Psychosocial  Psychosocial (WDL) X  Patient Behaviors Calm;Flat affect

## 2018-09-26 NOTE — Progress Notes (Signed)
Inpatient Diabetes Program Recommendations  AACE/ADA: New Consensus Statement on Inpatient Glycemic Control   Target Ranges:  Prepandial:   less than 140 mg/dL      Peak postprandial:   less than 180 mg/dL (1-2 hours)      Critically ill patients:  140 - 180 mg/dL  Results for MEHKAI, GALLO (MRN [id number]) as of 09/26/2018 10:27  Ref. Range 09/25/2018 04:15 09/25/2018 07:43 09/25/2018 12:31 09/25/2018 16:36 09/25/2018 20:57 09/26/2018 01:09 09/26/2018 01:37 09/26/2018 04:35 09/26/2018 07:56  Glucose-Capillary Latest Ref Range: 70 - 99 mg/dL 229 (H) 203 (H) 313 (H) 227 (H) 135 (H) 69 (L) 95 104 (H) 93   Results for RIEL, HIRSCHMAN (MRN [id number]) as of 09/26/2018 10:27  Ref. Range 09/12/2018 09:55  Hemoglobin A1C Latest Ref Range: 4.8 - 5.6 % 6.9 (H)   Review of Glycemic Control  Diabetes history: DM2 Outpatient Diabetes medications: Metformin 1000 mg BID Current orders for Inpatient glycemic control: Lantus 15 units daily, Novolog 0-20 units Q4H  Inpatient Diabetes Program Recommendations:   Insulin - Basal: Noted Lantus 15 units daily ordered on 09/25/18 and patient received on 09/25/18. Glucose down to 69 mg/dl today and noted steorids were discontinued. Please consider discontinuing Lantus at this time.   NOTE: Last dose of Solucortef 50 mg given at 5am on 09/25/18.   Thanks, Barnie Alderman, RN, MSN, CDE Diabetes Coordinator Inpatient Diabetes Program [phone] (Team Pager from 8am to 5pm)

## 2018-09-26 NOTE — Progress Notes (Signed)
Post HD Tx    09/26/18 1330  Vital Signs  Pulse Rate (!) 106  Resp 19  BP (!) 141/83  Oxygen Therapy  SpO2 98 %  Post-Hemodialysis Assessment  Rinseback Volume (mL) 250 mL  KECN 34.6 V  Dialyzer Clearance Lightly streaked  Duration of HD Treatment -hour(s) 2.5 hour(s)  Hemodialysis Intake (mL) 500 mL  UF Total -Machine (mL) 500 mL  Net UF (mL) 0 mL  Tolerated HD Treatment Yes  Hemodialysis Catheter Left Internal jugular Triple lumen Temporary  Placement Date/Time: 09/21/18 1100   Placed prior to admission: No  Time Out: Correct patient;Correct procedure;Correct site  Maximum sterile barrier precautions: Hand hygiene;Sterile gown;Cap;Sterile gloves;Large sterile sheet;Mask  Site Prep: Chlorh...  Site Condition No complications  Blue Lumen Status Other (Comment) (Sodium citrate packed )  Red Lumen Status Other (Comment) (Sodium Citrate packed )  Purple Lumen Status Capped (Central line)  Catheter fill solution 4% Sodium Citrate  Catheter fill volume (Arterial) 1.4 cc  Catheter fill volume (Venous) 1.4  Dressing Type Biopatch;Occlusive  Dressing Status Dressing changed  Interventions Dressing changed  Drainage Description None  Dressing Change Due 10/03/18  Post treatment catheter status Capped and Clamped

## 2018-09-26 NOTE — TOC Initial Note (Signed)
Transition of [facility]) - Initial/Assessment Note    Patient Details  Name: Isaac Cross MRN: [id number] Date of Birth: 1961/01/12  Transition of [facility]) CM/SW Contact:    Isaac Cross, Isaac Beets, LCSW Phone Number: 09/26/2018, 3:43 PM  Clinical Narrative:    Clinical Social Worker ([place]) and Chief Strategy Officer met with patient alone at bedside to discuss D/C plan. Patient was alert and oriented to self and place. CSW introduced self and explained role of CSW department. Per patient he lives in [place] with his girlfriend Isaac Cross and recently had bilateral knee surgery. CSW explained that PT is recommending CIR. CSW explained the difference between CIR and SNF. Per patient he prefers to D/C home and stated he already has home health. CSW and RN case manager explained the benefits of SNF and CIR. Patient continued to refuse CIR and SNF and stated that he wants to go home. Patient is hoping that he will not need dialysis long term. CSW provided emotional support. Patient did give CSW permission to contact his girlfriend to discuss D/C plan. CSW will continue to follow and assist as needed.   [employer], LCSW [phone]               Expected Discharge Plan: Isaac Cross to Discharge: Continued Medical Work up   Patient Goals and CMS Choice Patient states their goals for this hospitalization and ongoing recovery are:: Patient wants to feel better and go home, he wants to be able to walk without pain.  CMS Medicare.gov Compare Post Acute Cross list provided to:: Patient Choice offered to / list presented to : Patient  Expected Discharge Plan and Services Expected Discharge Plan: [place] In-house Referral: Clinical Social Work Discharge Planning Services: CM Consult Post Acute Cross Choice: [place] arrangements for the past 2 months: Isaac Cross: (Patient stated that he is open to  home health. ) Date [place]: 09/18/18 Time [place]: [place] Representative spoke with at [place]: Isaac Cross- unsure of when patient will discharge.  Prior Living Arrangements/Services Living arrangements for the past 2 months: [place] with:: Spouse Patient language and need for interpreter reviewed:: No Do you feel safe going back to the place where you live?: Yes      Need for Family Participation in Patient Cross: Yes (Comment) Cross giver support system in place?: Yes (comment) Current home services: DME([facility] ) Criminal Activity/Legal Involvement Pertinent to Current Situation/Hospitalization: No - Comment as needed  Activities of Daily Living Home Assistive Devices/Equipment: Environmental consultant (specify type), Eyeglasses ADL Screening (condition at time of admission) Patient's cognitive ability adequate to safely complete daily activities?: Yes Is the patient deaf or have difficulty hearing?: Yes Does the patient have difficulty seeing, even when wearing glasses/contacts?: No Does the patient have difficulty concentrating, remembering, or making decisions?: No Patient able to express need for assistance with ADLs?: Yes Does the patient have difficulty dressing or bathing?: No Independently performs ADLs?: Yes (appropriate for developmental age) Does the patient have difficulty walking or climbing stairs?: No Weakness of Legs: Both Weakness of Arms/Hands: None  Permission Sought/Granted Permission sought to share information with : Family Supports Permission granted to share information with : Yes, Verbal Permission Granted     Permission  granted to share info w AGENCY: Isaac Cross  Permission granted to share info w Relationship: Wife     Emotional Assessment Appearance:: Appears stated age Attitude/Demeanor/Rapport: Engaged Affect (typically observed): Guarded Orientation: : Oriented to Self, Oriented to Place, Oriented to  Time,  Fluctuating Orientation (Suspected and/or reported Sundowners) Alcohol / Substance Use: Not Applicable Psych Involvement: No (comment)  Admission diagnosis:  ARF (acute renal failure) ([place]) [N17.9] Sepsis ([place]) [A41.9] Patient Active Problem List   Diagnosis Date Noted  . Acute renal failure ([place])   . Septic shock ([place])   . Sepsis ([place]) 09/12/2018  . History of total knee arthroplasty 09/04/2018  . Coronary atherosclerosis due to calcified coronary lesion 12/02/2017  . Atherosclerosis of abdominal aorta ([place]) 06/17/2015  . Fatty liver disease, nonalcoholic 95/70/2202  . Renal cyst, right 06/17/2015  . BPH (benign prostatic hyperplasia) 05/25/2015  . Microscopic hematuria 05/25/2015  . Second degree hemorrhoids   . Diverticulosis of large intestine without diverticulitis   . Asthma, mild intermittent, Isaac-controlled 10/16/2014  . Allergic rhinitis 10/13/2014  . ED (erectile dysfunction) of organic origin 07/22/2014  . Gastro-esophageal reflux disease without esophagitis 07/22/2014  . Benign hypertension 07/22/2014  . 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[place]) 07/22/2014  . Adult BMI 30+ 07/22/2014  . Chronic obstructive asthma ([place]) 07/22/2014  . Depression with anxiety 07/22/2014  . Dyslipidemia 07/22/2014  . Genital herpes 07/22/2014  . 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 Osteoarthritis of both knees 07/22/2014  . Obstructive apnea 05/14/2013  . 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 Blood in the urine 09/20/2006   PCP:  Isaac Sizer, MD Pharmacy:   [facility] [address], [place] [address] Phone: [phone] Fax: [place] [place], [place] [address] [address] Phone: [phone] Fax: [phone]  [id number] [id number]: [phone]:  [phone]    Social Determinants of Health (SDOH) Interventions    Readmission Risk Interventions No flowsheet data found.

## 2018-09-26 NOTE — Progress Notes (Signed)
[facility]  ROUNDING NOTE   Subjective:  Patient completed dialysis today. Tolerated well. Potassium still low at 2.9.  Objective:  Vital signs in last 24 hours:  Temp:  [98.4 F (36.9 C)-98.8 F (37.1 C)] 98.8 F (37.1 C) (06/09 1330) Pulse Rate:  [96-110] 106 (06/09 1330) Resp:  [18-27] 19 (06/09 1330) BP: (127-153)/(78-94) 141/83 (06/09 1330) SpO2:  [91 %-100 %] 98 % (06/09 1330)  Weight change:  Filed Weights   09/23/18 1300 09/24/18 1640 09/24/18 1930  Weight: (!) 151.4 kg (!) 153.7 kg (!) 153.7 kg    Intake/Output: I/O last 3 completed shifts: In: 1765.8 [P.O.:480; I.V.:193.4; IV Piggyback:1092.3] Out: 1170 [Urine:1170]   Intake/Output this shift:  Total I/O In: 0  Out: 250 [Urine:250]  Physical Exam: General: NAD  Head: Moist oral mucosal membranes  Eyes: Anicteric   Lungs:  Scatterd rhonchi, normal effort  Heart: S1S2 no rubs  Abdomen:  Soft, nontender, obese, distended  Extremities: + peripheral edema., b/l knee dressing  Neurologic: Awake, alert, conversant  Skin: warm        Basic Metabolic Panel: Recent Labs  Lab 09/20/18 0500 09/21/18 0120  09/22/18 2013 09/23/18 0407 09/24/18 0327 09/25/18 0444 09/26/18 1027  NA 137 139   < > 142 144 144 142 140  K 3.9 4.3   < > 3.2* 3.2* 3.1* 3.4* 2.9*  CL 107 111   < > 107 109 105 103 102  CO2 19* 17*   < > 22 23 28 24 25   GLUCOSE 174* 192*   < > 222* 104* 158* 234* 100*  BUN 30* 37*   < > 55* 60* 56* 52* 66*  CREATININE 5.98* 7.73*   < > 8.89* 9.18* 8.30* 7.57* 9.23*  CALCIUM 8.2* 8.3*   < > 7.9* 8.1* 7.6* 7.7* 7.7*  MG 2.0 2.4  --  2.2 2.2  --  1.9  --   PHOS  --   --   --  4.0 4.1 3.7 4.5 4.4   < > = values in this interval not displayed.    Liver Function Tests: Recent Labs  Lab 09/20/18 0505 09/22/18 0411 09/23/18 0407 09/24/18 0327 09/25/18 0444 09/26/18 1027  AST 64* 44* 48*  --  56*  --   ALT 35 27 28  --  37  --   ALKPHOS 67 62 59  --  64  --   BILITOT 0.7 0.8  0.5  --  0.7  --   PROT 6.1* 6.1* 6.4*  --  6.0*  --   ALBUMIN 1.8* 1.8* 1.9* 1.9* 2.0* 1.8*   No results for input(s): LIPASE, AMYLASE in the last 168 hours. Recent Labs  Lab 09/20/18 0948 09/22/18 2014  AMMONIA 12 10    CBC: Recent Labs  Lab 09/21/18 0120  09/22/18 0411  09/22/18 2013 09/23/18 0407 09/24/18 0327 09/25/18 0444 09/26/18 0326  WBC 11.1*  --  10.3  --  9.5 9.9 8.6 9.8 10.4  NEUTROABS 8.0*  --  8.5*  --   --  7.5  --   --   --   HGB 8.7*   < > 7.8*   < > 7.7* 7.7* 7.0* 8.2* 8.8*  HCT 28.0*   < > 24.7*   < > 23.9* 23.8* 22.3* 25.4* 26.9*  MCV 86.2  --  84.3  --  83.3 82.4 84.2 83.8 83.5  PLT 276  --  124*  --  107* 102* 99* 150 227   < > =  values in this interval not displayed.    Cardiac Enzymes: Recent Labs  Lab 09/20/18 0948 09/23/18 1207 09/26/18 0326  CKTOTAL 992* 100 90    BNP: Invalid input(s): POCBNP  CBG: Recent Labs  Lab 09/25/18 2057 09/26/18 0109 09/26/18 0137 09/26/18 0435 09/26/18 0756  GLUCAP 135* 69* 95 104* 93    Microbiology: Results for orders placed or performed during the hospital encounter of 09/12/18  Blood Culture (routine x 2)     Status: None   Collection Time: 09/12/18  9:56 AM  Result Value Ref Range Status   Specimen Description BLOOD LEFT HAND  Final   Special Requests   Final    BOTTLES DRAWN AEROBIC AND ANAEROBIC Blood Culture adequate volume   Culture   Final    NO GROWTH 5 DAYS Performed at [facility], [address] [place], [place] [address]    Report Status 09/17/2018 FINAL  Final  Urine culture     Status: None   Collection Time: 09/12/18 10:01 AM  Result Value Ref Range Status   Specimen Description   Final    URINE, RANDOM Performed at [facility], [address], [place], [place] [address]    Special Requests   Final    NONE Performed at [facility], [address], [place], [place] [address]    Culture   Final    NO GROWTH Performed at [facility], [place] [address], [place], [place] [address]    Report Status 09/13/2018 FINAL  Final  SARS Coronavirus 2 (CEPHEID- Performed in [place] hospital lab), Hosp Order     Status: None   Collection Time: 09/12/18 10:02 AM  Result Value Ref Range Status   SARS Coronavirus 2 NEGATIVE NEGATIVE Final    Comment: (NOTE) If result is NEGATIVE SARS-CoV-2 target nucleic acids are NOT DETECTED. The SARS-CoV-2 RNA is generally detectable in upper and lower  respiratory specimens during the acute phase of infection. The lowest  concentration of SARS-CoV-2 viral copies this assay can detect is 250  copies / mL. A negative result does not preclude SARS-CoV-2 infection  and should not be used as the sole basis for treatment or other  patient management decisions.  A negative result may occur with  improper specimen collection / handling, submission of specimen other  than nasopharyngeal swab, presence of viral mutation(s) within the  areas targeted by this assay, and inadequate number of viral copies  (<250 copies / mL). A negative result must be combined with clinical  observations, patient history, and epidemiological information. If result is POSITIVE SARS-CoV-2 target nucleic acids are DETECTED. The SARS-CoV-2 RNA is generally detectable in upper and lower  respiratory specimens dur ing the acute phase of infection.  Positive  results are indicative of active infection with SARS-CoV-2.  Clinical  correlation with patient history and other diagnostic information is  necessary to determine patient infection status.  Positive results do  not rule out bacterial infection or co-infection with other viruses. If result is PRESUMPTIVE POSTIVE SARS-CoV-2 nucleic acids MAY BE PRESENT.   A presumptive positive result was obtained on the submitted specimen  and confirmed on repeat testing.  While 2019 novel coronavirus  (SARS-CoV-2) nucleic acids may be present in the submitted sample  additional  confirmatory testing may be necessary for epidemiological  and / or clinical management purposes  to differentiate between  SARS-CoV-2 and other Sarbecovirus currently known to infect humans.  If clinically indicated additional testing  with an alternate test  methodology [phone]) is advised. The SARS-CoV-2 RNA is generally  detectable in upper and lower respiratory sp ecimens during the acute  phase of infection. The expected result is Negative. Fact Sheet for Patients:  StrictlyIdeas.no Fact Sheet for Healthcare Providers: BankingDealers.co.za This test is not yet approved or cleared by the [place] FDA and has been authorized for detection and/or diagnosis of SARS-CoV-2 by FDA under an Emergency Use Authorization (EUA).  This EUA will remain in effect (meaning this test can be used) for the duration of the COVID-19 declaration under Section 564(b)(1) of the Act, 21 U.S.C. section 360bbb-3(b)(1), unless the authorization is terminated or revoked sooner. Performed at [facility], [address] [place], [place] [address]   Blood Culture (routine x 2)     Status: None   Collection Time: 09/12/18 10:06 AM  Result Value Ref Range Status   Specimen Description BLOOD LEFT ANTECUBITAL  Final   Special Requests   Final    BOTTLES DRAWN AEROBIC AND ANAEROBIC Blood Culture adequate volume   Culture   Final    NO GROWTH 5 DAYS Performed at [facility], [address] [place], [place] [address]    Report Status 09/17/2018 FINAL  Final  MRSA PCR Screening     Status: None   Collection Time: 09/12/18  7:04 PM  Result Value Ref Range Status   MRSA by PCR NEGATIVE NEGATIVE Final    Comment:        The GeneXpert MRSA Assay (FDA approved for NASAL specimens only), is one component of a comprehensive MRSA colonization surveillance program. It is not intended to diagnose MRSA infection nor to guide or monitor  treatment for MRSA infections. Performed at [facility], [address] [place], [place] [address]   C difficile quick scan w PCR reflex     Status: None   Collection Time: 09/14/18  2:21 PM  Result Value Ref Range Status   C Diff antigen NEGATIVE NEGATIVE Final   C Diff toxin NEGATIVE NEGATIVE Final   C Diff interpretation No C. difficile detected.  Final    Comment: Performed at [facility], [address] [place], [place] [address]  Group A Strep by PCR     Status: None   Collection Time: 09/15/18  1:53 PM  Result Value Ref Range Status   Group A Strep by PCR NOT DETECTED NOT DETECTED Final    Comment: Performed at [facility], [address] [place], [place] [address]  Body fluid culture     Status: None   Collection Time: 09/15/18  5:13 PM  Result Value Ref Range Status   Specimen Description   Final    KNEE Performed at [facility], [address] [place], [place] [address]    Special Requests   Final    NONE Performed at [facility], [address] [place], [place] [address]    Gram Stain   Final    RARE RBCS FEW WBC SEEN NO ORGANISMS SEEN Performed at [facility], [address], [place], [place] [address]    Culture   Final    NO GROWTH 3 DAYS Performed at [facility], [place] [address] [place],  [address]    Report Status 09/19/2018 FINAL  Final  Body fluid culture     Status: None   Collection Time: 09/16/18  8:55 AM  Result Value Ref Range Status   Specimen  Description KNEE RIGHT  Final   Special Requests NONE  Final   Gram Stain   Final    RARE WBC PRESENT, PREDOMINANTLY PMN NO ORGANISMS SEEN    Culture   Final    NO GROWTH 3 DAYS Performed at [facility], [place] [address] [place], [place] [address]    Report Status 09/20/2018 FINAL  Final  Aerobic/Anaerobic Culture (surgical/deep wound)     Status: None   Collection Time: 09/17/18 11:32 AM  Result Value Ref  Range Status   Specimen Description   Final    TISSUE LEFT KNEE 1 Performed at [facility], [address] [place], [place] [address]    Special Requests PATIENT ON FOLLOWING MAXIPIME VANCOMYCIN  Final   Gram Stain   Final    FEW WBC PRESENT,BOTH PMN AND MONONUCLEAR NO ORGANISMS SEEN    Culture   Final    No growth aerobically or anaerobically. Performed at [facility], [place] [address], [place], [place] [address]    Report Status 09/22/2018 FINAL  Final  Acid Fast Smear (AFB)     Status: None   Collection Time: 09/17/18 11:32 AM  Result Value Ref Range Status   AFB Specimen Processing Comment  Final    Comment: Tissue Grinding and Digestion/Decontamination   Acid Fast Smear Negative  Final    Comment: (NOTE) Performed At: [facility] [place], [place] [id number] Rush Farmer MD FU:[id number]    Source (AFB) TISSUE  Final    Comment: LEFT KNEE Performed at [facility], [place] [address] [place], [place] [address]   Culture, fungus without smear     Status: None (Preliminary result)   Collection Time: 09/17/18 11:34 AM  Result Value Ref Range Status   Specimen Description   Final    SYNOVIAL Performed at [facility], [address], [place], [place] [address]    Special Requests   Final    NONE Performed at [facility], [address] [place], [place] [address]    Culture   Final    NO FUNGUS ISOLATED AFTER 6 DAYS Performed at [facility], [place] [address], [place], [place] [address]    Report Status PENDING  Incomplete  Acid Fast Culture with reflexed sensitivities     Status: None   Collection Time: 09/17/18 11:36 AM  Result Value Ref Range Status   Acid Fast Culture CANC  Final    Comment: (NOTE) Test cancelled at client's request.      Cancelled per [place] Erp 09/21/2018 Performed At: [facility] [place], [place] [id number] Rush Farmer MD YI:[id number]     Source of Sample TISSUE  Final    Comment: RIGHT KNEE Performed at [facility], [place] [address], [place], [place] [address]   Aerobic/Anaerobic Culture (surgical/deep wound)     Status: None   Collection Time: 09/17/18 11:43 AM  Result Value Ref Range Status   Specimen Description   Final    TISSUE LEFT KNEE 2 Performed at [facility], [address] [place], [place] [address]    Special Requests PATIENT ON FOLLOWING MAXIPIME VANCOMYCIN  Final   Gram Stain   Final    FEW WBC PRESENT, PREDOMINANTLY PMN NO ORGANISMS SEEN    Culture   Final    No growth aerobically or anaerobically. Performed at [facility], [place] [address], [place], [place] [address]  Report Status 09/22/2018 FINAL  Final  Aerobic/Anaerobic Culture (surgical/deep wound)     Status: None   Collection Time: 09/17/18 11:44 AM  Result Value Ref Range Status   Specimen Description   Final    TISSUE LEFT KNEE 3 Performed at [facility], [address] [place], [place] [address]    Special Requests PATIENT ON FOLLOWING MAXIPIME VANCOMYCIN  Final   Gram Stain   Final    MODERATE WBC PRESENT, PREDOMINANTLY PMN NO ORGANISMS SEEN    Culture   Final    No growth aerobically or anaerobically. Performed at [facility], [place] [address] [place], [place] [address]    Report Status 09/22/2018 FINAL  Final  Aerobic/Anaerobic Culture (surgical/deep wound)     Status: None   Collection Time: 09/18/18  9:43 PM  Result Value Ref Range Status   Specimen Description   Final    SYNOVIAL Performed at [facility], [address], [place], [place] [address]    Special Requests   Final    NONE Performed at [facility], [address] [place], [place] [address]    Gram Stain   Final    RARE WBC PRESENT, PREDOMINANTLY MONONUCLEAR NO ORGANISMS SEEN    Culture   Final    No growth aerobically or anaerobically. Performed at [facility], [place] [address], [place], [place] [address]    Report Status 09/24/2018 FINAL  Final  Aerobic/Anaerobic Culture (surgical/deep wound)     Status: None   Collection Time: 09/18/18  9:44 PM  Result Value Ref Range Status   Specimen Description   Final    SYNOVIAL Performed at [facility], [address], [place], [place] [address]    Special Requests   Final    RIGHT KNEE Performed at [facility], [place], [place] [address]    Gram Stain   Final    FEW WBC PRESENT,BOTH PMN AND MONONUCLEAR NO ORGANISMS SEEN    Culture   Final    No growth aerobically or anaerobically. Performed at [facility], [place] [address] [place], [place] [address]    Report Status 09/24/2018 FINAL  Final  Aerobic/Anaerobic Culture (surgical/deep wound)     Status: None   Collection Time: 09/18/18  9:45 PM  Result Value Ref Range Status   Specimen Description   Final    SYNOVIAL Performed at [facility], [address] [place], [place] [address]    Special Requests   Final    RIGHT KNEE Performed at [facility], [address], [place] [address]    Gram Stain   Final    FEW WBC PRESENT, PREDOMINANTLY MONONUCLEAR NO ORGANISMS SEEN    Culture   Final    No growth aerobically or anaerobically. Performed at [facility], [place] [address] [place],  [address]    Report Status 09/24/2018 FINAL  Final  Culture, blood (Routine X 2) w Reflex to ID Panel     Status: None   Collection Time: 09/19/18  1:11 PM  Result Value Ref Range Status   Specimen Description BLOOD LEFT ANTECUBITAL  Final   Special Requests   Final    BOTTLES DRAWN AEROBIC AND ANAEROBIC Blood Culture results may not be optimal due to an excessive volume of blood received in culture bottles   Culture   Final    NO GROWTH 5 DAYS Performed at [facility], [address] [place], [place] [address]    Report Status 09/24/2018 FINAL  Final  Culture, blood  (Routine X 2) w Reflex to ID Panel     Status: None   Collection Time: 09/19/18  1:20 PM  Result Value Ref Range Status   Specimen Description BLOOD BLOOD RIGHT HAND  Final   Special Requests   Final    BOTTLES DRAWN AEROBIC AND ANAEROBIC Blood Culture results may not be optimal due to an excessive volume of blood received in culture bottles   Culture   Final    NO GROWTH 5 DAYS Performed at [facility], [address] [place], [place] [address]    Report Status 09/24/2018 FINAL  Final  Urine Culture     Status: None   Collection Time: 09/20/18  3:59 PM  Result Value Ref Range Status   Specimen Description   Final    URINE, RANDOM Performed at [facility], [address], [place], [place] [address]    Special Requests   Final    NONE Performed at [facility], [address] [place], [place] [address]    Culture   Final    NO GROWTH Performed at [facility], [place] [address], [place], [place] [address]    Report Status 09/21/2018 FINAL  Final  C difficile quick scan w PCR reflex     Status: None   Collection Time: 09/21/18  3:27 PM  Result Value Ref Range Status   C Diff antigen NEGATIVE NEGATIVE Final   C Diff toxin NEGATIVE NEGATIVE Final   C Diff interpretation No C. difficile detected.  Final    Comment: Performed at [facility], [address] [place], [place] [address]  Gastrointestinal Panel by PCR , Stool     Status: None   Collection Time: 09/21/18  3:27 PM  Result Value Ref Range Status   Campylobacter species NOT DETECTED NOT DETECTED Final   Plesimonas shigelloides NOT DETECTED NOT DETECTED Final   Salmonella species NOT DETECTED NOT DETECTED Final   Yersinia enterocolitica NOT DETECTED NOT DETECTED Final   Vibrio species NOT DETECTED NOT DETECTED Final   Vibrio cholerae NOT DETECTED NOT DETECTED Final   Enteroaggregative E coli (EAEC) NOT DETECTED NOT DETECTED Final   Enteropathogenic E coli (EPEC) NOT  DETECTED NOT DETECTED Final   Enterotoxigenic E coli (ETEC) NOT DETECTED NOT DETECTED Final   Shiga like toxin producing E coli (STEC) NOT DETECTED NOT DETECTED Final   Shigella/Enteroinvasive E coli (EIEC) NOT DETECTED NOT DETECTED Final   Cryptosporidium NOT DETECTED NOT DETECTED Final   Cyclospora cayetanensis NOT DETECTED NOT DETECTED Final   Entamoeba histolytica NOT DETECTED NOT DETECTED Final   Giardia lamblia NOT DETECTED NOT DETECTED Final   Adenovirus F40/41 NOT DETECTED NOT DETECTED Final   Astrovirus NOT DETECTED NOT DETECTED Final   Norovirus GI/GII NOT DETECTED NOT DETECTED Final   Rotavirus A NOT DETECTED NOT DETECTED Final   Sapovirus (I, II, IV, and V) NOT DETECTED NOT DETECTED Final    Comment: Performed at [facility], [address] [place], [place] [address]    Coagulation Studies: No results for input(s): LABPROT, INR in the last 72 hours.  Urinalysis: No results for input(s): COLORURINE, LABSPEC, PHURINE, GLUCOSEU, HGBUR, BILIRUBINUR, KETONESUR, PROTEINUR, UROBILINOGEN, NITRITE, LEUKOCYTESUR in the last 72 hours.  Invalid input(s): APPERANCEUR    Imaging: No results found.   Medications:   . sodium chloride Stopped (  09/26/18 0548)  . sodium chloride    . anidulafungin Stopped (09/25/18 2339)  . anticoagulant sodium citrate    . argatroban 0.5 mcg/kg/min (09/26/18 4128)  . doxycycline (VIBRAMYCIN) IV Stopped (09/26/18 0004)  . piperacillin-tazobactam (ZOSYN)  IV 3.375 g (09/26/18 0548)   . aspirin EC  81 mg Oral Daily  . budesonide (PULMICORT) nebulizer solution  0.5 mg Nebulization BID  . Chlorhexidine Gluconate Cloth  6 each Topical Q0600  . feeding supplement (NEPRO CARB STEADY)  237 mL Oral BID BM  . folic acid  1 mg Intravenous Daily  . insulin aspart  0-20 Units Subcutaneous Q4H  . insulin glargine  15 Units Subcutaneous Daily  . mouth rinse  15 mL Mouth Rinse BID  . multivitamin  1 tablet Oral QHS  . sodium chloride flush  10-40  mL Intracatheter Q12H  . sodium chloride flush  3 mL Intravenous Q12H  . warfarin  5 mg Oral ONCE-1800  . Warfarin - Pharmacist Dosing Inpatient   Does not apply q1800   sodium chloride, sodium chloride, acetaminophen **OR** acetaminophen, albuterol, alum & mag hydroxide-simeth, anticoagulant sodium citrate, fentaNYL (SUBLIMAZE) injection, HYDROcodone-acetaminophen, iohexol, ipratropium-albuterol, menthol-cetylpyridinium **OR** phenol, menthol-cetylpyridinium **OR** phenol, metoCLOPramide **OR** metoCLOPramide (REGLAN) injection, ondansetron **OR** ondansetron (ZOFRAN) IV, sodium chloride flush, sodium chloride flush  Assessment/ Plan:  [name] is a 58 y.o. black male with hyeprtension, COPD, diabetes mellitus type II who underwent bilateral knee replacement surgeries last week. who was admitted to [facility] on 09/12/2018 for sepsis  1. Acute renal failure with metabolic acidosis  baseline creatinine of 1.07 with normal GFR in 06/12/18.  Likely severe ATN from hypotension and sepsis -Urine output was 890 cc over the preceding 24 hours.  However BUN and creatinine remain high.  Patient underwent dialysis treatment today.  He may end up requiring short-term hemodialysis as an outpatient as well.  2. Septic shock/hypotension  -Recent bilateral knee replacement on Sep 01, 2018 at [facility]  -  knee septic arthritis, patient underwent I&D and polyethylene exchange on 5/31 and Rt knee on 6/1 -Currently being treated with anidulafungin, Doxycycline and Zosyn - dose adjustment as per pharmacist  3. Hypokalemia -Potassium down to 2.9 today.       LOS: 14 Naleyah Ohlinger 6/9/20203:22 PM

## 2018-09-26 NOTE — Consult Note (Signed)
Hematology/Oncology Consult note [facility] [phone] [phone]  Patient Care Team: Steele Sizer, MD as PCP - General (Family Medicine)   Name of the patient: Isaac Cross  [id number]  Mar 22, 1961    Reason for consult: Management of heparin-induced thrombocytopenia   Requesting physician: Dr. Fritzi Mandes  Date of visit: 09/26/2018    History of presenting illness-patient is a 58 year old African-American male with a past medical history significant for COPD, hypertension and type 2 diabetes who recently underwent a bilateral knee replacement on 09/12/2018.  He has been admitted to the hospital with septic shock possibly secondary to septic arthritis complicated by acute renal failure requiring dialysis and acute encephalopathy.  Patient was noted to have  acute left popliteal vein DVT on 09/20/2018 and was started on a heparin drip.  Platelet count of 269 on admission which steadily went down to 124 on 09/22/2018 and down 102 on 09/23/2018.  His heparin drip was stopped and he was switched to argatroban.  HIT antibody testing was positive.  Serotonin release assay is currently pending.  Platelet counts are now normalized after starting argatroban drip.  Hematology consulted for further anticoagulation management  Patient is currently resting peacefully. Denies any pain or bleeding    Review of systems- Review of Systems  Constitutional: Positive for malaise/fatigue. Negative for chills, fever and weight loss.  HENT: Negative for congestion, ear discharge and nosebleeds.   Eyes: Negative for blurred vision.  Respiratory: Negative for cough, hemoptysis, sputum production, shortness of breath and wheezing.   Cardiovascular: Negative for chest pain, palpitations, orthopnea and claudication.  Gastrointestinal: Negative for abdominal pain, blood in stool, constipation, diarrhea, heartburn, melena, nausea and vomiting.  Genitourinary: Negative for  dysuria, flank pain, frequency, hematuria and urgency.  Musculoskeletal: Negative for back pain, joint pain and myalgias.  Skin: Negative for itching and rash.  Neurological: Negative for dizziness, tingling, focal weakness, seizures, weakness and headaches.  Endo/Heme/Allergies: Does not bruise/bleed easily.  Psychiatric/Behavioral: Negative for depression and suicidal ideas. The patient does not have insomnia.     Allergies  Allergen Reactions   Heparin     Heparin antibody positive; SRA pending   Lipitor [Atorvastatin] Hives and Itching   Other    Viagra  [Sildenafil Citrate]     vision loss    Patient Active Problem List   Diagnosis Date Noted   Acute renal failure ([place])    Septic shock ([place])    Sepsis ([place]) 09/12/2018   History of total knee arthroplasty 09/04/2018   Coronary atherosclerosis due to calcified coronary lesion 12/02/2017   Atherosclerosis of abdominal aorta ([place]) 06/17/2015   Fatty liver disease, nonalcoholic 02/13/2535   Renal cyst, right 06/17/2015   BPH (benign prostatic hyperplasia) 05/25/2015   Microscopic hematuria 05/25/2015   Second degree hemorrhoids    Diverticulosis of large intestine without diverticulitis    Asthma, mild intermittent, well-controlled 10/16/2014   Allergic rhinitis 10/13/2014   ED (erectile dysfunction) of organic origin 07/22/2014   Gastro-esophageal reflux disease without esophagitis 07/22/2014   Benign hypertension 07/22/2014   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[place]) 07/22/2014   Adult BMI 30+ 07/22/2014   Chronic obstructive asthma ([place]) 07/22/2014   Depression with anxiety 07/22/2014   Dyslipidemia 07/22/2014   Genital herpes 07/22/2014   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   Osteoarthritis of both knees 07/22/2014   Obstructive apnea 05/14/2013   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   Blood in the urine 09/20/2006     Past Medical  History:  Diagnosis Date    Allergy    Anxiety    Arthritis    knees   Asthma, mild intermittent, well-controlled    Chronic obstructive asthma (HCC)    COPD (chronic obstructive pulmonary disease) (HCC)    Depression with anxiety    Diabetes (HCC)    GERD (gastroesophageal reflux disease)    HBP (high blood pressure)    High cholesterol    Renal cyst, right    Sleep apnea    CPAP   Swelling      Past Surgical History:  Procedure Laterality Date   BLADDER REPAIR     COLONOSCOPY WITH PROPOFOL N/A 04/07/2015   Procedure: COLONOSCOPY WITH PROPOFOL;  Surgeon: Lucilla Lame, MD;  Location: [facility];  Service: Endoscopy;  Laterality: N/A;  Diabetic - oral meds CPAP   HAND SURGERY     4TH AND 5TH FINGER   I&D KNEE WITH POLY EXCHANGE Right 09/18/2018   Procedure: IRRIGATION AND DEBRIDEMENT KNEE WITH POLY EXCHANGE;  Surgeon: Lovell Sheehan, MD;  Location: [facility];  Service: Orthopedics;  Laterality: Right;   KNEE ARTHROSCOPY Right    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   TOTAL KNEE ARTHROPLASTY Left 09/17/2018   Procedure: I &D left knee and polyethylene exchange;  Surgeon: Lovell Sheehan, MD;  Location: [facility];  Service: Orthopedics;  Laterality: Left;   VEIN SURGERY Right     Social History   Socioeconomic History   Marital status: Married    Spouse name: Not on file   Number of children: 2   Years of education: Not on file   Highest education level: Bachelor's degree (e.g., BA, AB, BS)  Occupational History   Occupation: case Engineer, maintenance strain: Not hard at all   Food insecurity:    Worry: Never true    Inability: Never true   Transportation needs:    Medical: No    Non-medical: No  Tobacco Use   Smoking status: Former Smoker    Packs/day: 1.00    Years: 10.00    Pack years: 10.00    Types: Cigarettes    Start date: 04/20/1995    Last attempt to quit: 04/19/2005    Years since quitting: 13.4    Smokeless tobacco: Never Used  Substance and Sexual Activity   Alcohol use: Yes    Alcohol/week: 10.0 standard drinks    Types: 10 Cans of beer per week    Comment: occasional   Drug use: No   Sexual activity: Yes    Partners: Female  Lifestyle   Physical activity:    Days per week: 0 days    Minutes per session: 0 min   Stress: Not at all  Relationships   Social connections:    Talks on phone: More than three times a week    Gets together: Once a week    Attends religious service: Never    Active member of club or organization: No    Attends meetings of clubs or organizations: Never    Relationship status: Married   Intimate partner violence:    Fear of current or ex partner: No    Emotionally abused: No    Physically abused: No    Forced sexual activity: No  Other Topics Concern   Not on file  Social History Narrative   Lives at home with wife, using a walker since his surgery recently.     Family History  Problem Relation Age of Onset   Hypertension Mother    Diabetes Mother    Diabetes Father    Hypertension Father    Cancer Father    Seizures Daughter    Lupus Daughter    Cancer Maternal Grandfather        Prostate   Diabetes Paternal Grandfather    Prostate cancer Maternal Uncle    Bladder Cancer Neg Hx    Kidney cancer Neg Hx      Current Facility-Administered Medications:    0.9 %  sodium chloride infusion, , Intravenous, PRN, Flora Lipps, MD, Stopped at 09/26/18 0548   0.9 %  sodium chloride infusion, 250 mL, Intravenous, PRN, Flora Lipps, MD   acetaminophen (TYLENOL) tablet 650 mg, 650 mg, Oral, Q6H PRN, 650 mg at 09/19/18 0903 **OR** acetaminophen (TYLENOL) suppository 650 mg, 650 mg, Rectal, Q6H PRN, Lovell Sheehan, MD, 650 mg at 09/20/18 0444   albuterol (PROVENTIL) (2.5 MG/3ML) 0.083% nebulizer solution 2.5 mg, 2.5 mg, Nebulization, Q6H PRN, Lovell Sheehan, MD   alum & mag hydroxide-simeth (MAALOX/MYLANTA) 200-200-20  MG/5ML suspension 30 mL, 30 mL, Oral, Q4H PRN, Fritzi Mandes, MD   [COMPLETED] anidulafungin (ERAXIS) 200 mg in sodium chloride 0.9 % 200 mL IVPB, 200 mg, Intravenous, Once, Stopped at 09/20/18 2104 **FOLLOWED BY** anidulafungin (ERAXIS) 100 mg in sodium chloride 0.9 % 100 mL IVPB, 100 mg, Intravenous, Q24H, Ravishankar, Jayashree, MD, Stopped at 09/25/18 2339   argatroban 1 mg/mL infusion, 0.5 mcg/kg/min, Intravenous, Continuous, Dallie Piles, RPH, Last Rate: 4.51 mL/hr at 09/26/18 0623, 0.5 mcg/kg/min at 09/26/18 2330   aspirin EC tablet 81 mg, 81 mg, Oral, Daily, Lovell Sheehan, MD, 81 mg at 09/25/18 0936   budesonide (PULMICORT) nebulizer solution 0.5 mg, 0.5 mg, Nebulization, BID, Kasa, Kurian, MD, 0.5 mg at 09/26/18 0830   Chlorhexidine Gluconate Cloth 2 % PADS 6 each, 6 each, Topical, Q0600, Lovell Sheehan, MD, 6 each at 09/26/18 0559   doxycycline (VIBRAMYCIN) 100 mg in sodium chloride 0.9 % 250 mL IVPB, 100 mg, Intravenous, Q12H, Ravishankar, Joellyn Quails, MD, Stopped at 09/26/18 0004   feeding supplement (NEPRO CARB STEADY) liquid 237 mL, 237 mL, Oral, BID BM, Kasa, Kurian, MD, Last Rate: 237 mL/hr at 09/25/18 0951, 237 mL at 09/25/18 0951   fentaNYL (SUBLIMAZE) injection 12.5 mcg, 12.5 mcg, Intravenous, Q8H PRN, Awilda Bill, NP, 12.5 mcg at 07/62/26 3335   folic acid injection 1 mg, 1 mg, Intravenous, Daily, Awilda Bill, NP, 1 mg at 09/25/18 1105   HYDROcodone-acetaminophen (NORCO/VICODIN) 5-325 MG per tablet 1-2 tablet, 1-2 tablet, Oral, Q4H PRN, Lovell Sheehan, MD, 2 tablet at 09/19/18 0450   insulin aspart (novoLOG) injection 0-20 Units, 0-20 Units, Subcutaneous, Q4H, Tukov-Yual, Magdalene S, NP, 3 Units at 09/25/18 2114   insulin glargine (LANTUS) injection 15 Units, 15 Units, Subcutaneous, Daily, Fritzi Mandes, MD, 15 Units at 09/25/18 1729   iohexol (OMNIPAQUE) 240 MG/ML injection 50 mL, 50 mL, Oral, Once PRN, Awilda Bill, NP   ipratropium-albuterol (DUONEB)  0.5-2.5 (3) MG/3ML nebulizer solution 3 mL, 3 mL, Nebulization, Q6H PRN, Mortimer Fries, Kurian, MD, 3 mL at 09/25/18 0721   MEDLINE mouth rinse, 15 mL, Mouth Rinse, BID, Kasa, Kurian, MD, 15 mL at 09/25/18 0951   menthol-cetylpyridinium (CEPACOL) lozenge 3 mg, 1 lozenge, Oral, PRN **OR** phenol (CHLORASEPTIC) mouth spray 1 spray, 1 spray, Mouth/Throat, PRN, Lovell Sheehan, MD   menthol-cetylpyridinium (CEPACOL) lozenge 3 mg, 1 lozenge, Oral, PRN **OR** phenol (CHLORASEPTIC) mouth spray  1 spray, 1 spray, Mouth/Throat, PRN, Lovell Sheehan, MD   metoCLOPramide (REGLAN) tablet 5-10 mg, 5-10 mg, Oral, Q8H PRN **OR** metoCLOPramide (REGLAN) injection 5-10 mg, 5-10 mg, Intravenous, Q8H PRN, Lovell Sheehan, MD   multivitamin (RENA-VIT) tablet 1 tablet, 1 tablet, Oral, QHS, Flora Lipps, MD, 1 tablet at 09/25/18 2113   ondansetron (ZOFRAN) tablet 4 mg, 4 mg, Oral, Q6H PRN **OR** ondansetron (ZOFRAN) injection 4 mg, 4 mg, Intravenous, Q6H PRN, Lovell Sheehan, MD   piperacillin-tazobactam (ZOSYN) IVPB 3.375 g, 3.375 g, Intravenous, Q12H, Gladstone Lighter, MD, Last Rate: 12.5 mL/hr at 09/26/18 0548, 3.375 g at 09/26/18 0548   sodium chloride flush (NS) 0.9 % injection 10-40 mL, 10-40 mL, Intracatheter, Q12H, Kolluru, Sarath, MD   sodium chloride flush (NS) 0.9 % injection 10-40 mL, 10-40 mL, Intracatheter, PRN, Kolluru, Sarath, MD   sodium chloride flush (NS) 0.9 % injection 3 mL, 3 mL, Intravenous, Q12H, Kasa, Kurian, MD, 3 mL at 09/25/18 0934   sodium chloride flush (NS) 0.9 % injection 3 mL, 3 mL, Intravenous, PRN, Flora Lipps, MD, 3 mL at 09/24/18 0805   Physical exam:  Vitals:   09/26/18 1041 09/26/18 1045 09/26/18 1100 09/26/18 1115  BP: (!) 141/78 137/82 (!) 145/80 (!) 127/92  Pulse: (!) 102 100 100 100  Resp: (!) 21 (!) 25 (!) 25 20  Temp:      TempSrc:      SpO2: 98% 99% 98% 99%  Weight:      Height:       Physical Exam HENT:     Head: Normocephalic and atraumatic.  Eyes:      Pupils: Pupils are equal, round, and reactive to light.  Neck:     Musculoskeletal: Normal range of motion.     Comments: Dialysis catheter on the left side Cardiovascular:     Rate and Rhythm: Normal rate and regular rhythm.     Heart sounds: Normal heart sounds.  Pulmonary:     Effort: Pulmonary effort is normal.     Breath sounds: Normal breath sounds.  Abdominal:     General: Bowel sounds are normal.     Palpations: Abdomen is soft.  Skin:    General: Skin is warm and dry.  Neurological:     Mental Status: He is alert and oriented to person, place, and time.        CMP Latest Ref Rng & Units 09/26/2018  Glucose 70 - 99 mg/dL 100(H)  BUN 6 - 20 mg/dL 66(H)  Creatinine 0.61 - 1.24 mg/dL 9.23(H)  Sodium 135 - 145 mmol/L 140  Potassium 3.5 - 5.1 mmol/L 2.9(L)  Chloride 98 - 111 mmol/L 102  CO2 22 - 32 mmol/L 25  Calcium 8.9 - 10.3 mg/dL 7.7(L)  Total Protein 6.5 - 8.1 g/dL -  Total Bilirubin 0.3 - 1.2 mg/dL -  Alkaline Phos 38 - 126 U/L -  AST 15 - 41 U/L -  ALT 0 - 44 U/L -   CBC Latest Ref Rng & Units 09/26/2018  WBC 4.0 - 10.5 K/uL 10.4  Hemoglobin 13.0 - 17.0 g/dL 8.8(L)  Hematocrit 39.0 - 52.0 % 26.9(L)  Platelets 150 - 400 K/uL 227    @IMAGES @  Ct Head Wo Contrast  Result Date: 09/14/2018 CLINICAL DATA:  Altered mental status. Hypertension. Symptoms began today. EXAM: CT HEAD WITHOUT CONTRAST TECHNIQUE: Contiguous axial images were obtained from the base of the skull through the vertex without intravenous contrast. COMPARISON:  None. FINDINGS: Brain: The  brain shows a normal appearance without evidence of malformation, atrophy, old or acute small or large vessel infarction, mass lesion, hemorrhage, hydrocephalus or extra-axial collection. Vascular: No hyperdense vessel. No evidence of atherosclerotic calcification. Skull: Normal.  No traumatic finding.  No focal bone lesion. Sinuses/Orbits: Sinuses are clear. Orbits appear normal. Mastoids are clear. Other: None  significant IMPRESSION: Normal head CT Electronically Signed   By: Nelson Chimes M.D.   On: 09/14/2018 15:21   Ct Knee Left Wo Contrast  Result Date: 09/15/2018 CLINICAL DATA:  Knee replacement, bilateral knee pain.  Sepsis. EXAM: CT OF THE left KNEE WITHOUT CONTRAST TECHNIQUE: Multidetector CT imaging of the left knee was performed according to the standard protocol. Multiplanar CT image reconstructions were also generated. COMPARISON:  Radiographs 09/12/2018 FINDINGS: Bones/Joint/Cartilage Total knee prosthesis in place. No significant abnormal lucency along the metal-methacrylate it or methacrylate-bone interfaces at this time. There may be a tiny amount of gas just above the anterior portion of the tibial tray on image 53/2 which would be unusual for 2 weeks out from surgery, but the patient has had recent arthrocentesis or drain in place than back because for gas in the joint. Moderate to large joint effusion with some speckled calcifications along the margins of the suprapatellar bursa for example as shown on image 12/3. Free osteochondral fragments are present posteromedially in the joint along the margin of the polymer spacer. No complicating feature of the patellar component is identified. There is a moderate size Baker's cyst posteromedially. In addition there is a suggestion of a somewhat distended popliteus recess potentially companion containing at least 1 free osteochondral fragment. Ligaments Suboptimally assessed by CT. Muscles and Tendons There is appearance of at least partial disruption of the distal quadriceps tendon. Soft tissues Diffuse subcutaneous edema along the visualized portion of the distal thigh and proximal calf. IMPRESSION: 1. Moderate to large joint effusion, potentially with a tiny amount of loculated gas just above the anterior portion of the tibial tray best appreciated on the axial images. This would be unusual 2 weeks out from surgery, and could possibly indicate infection,  but if the patient has had recent arthrocentesis or drain placement in the joint then sterile gas may have been introduced at that time. Arthrocentesis could be utilized for definitive assessment of infection of the joint fluid. 2. There is no abnormal lucency along the margins of the implant. 3. Several free osteochondral fragments posteromedial to the joint line and also in the popliteus recess. 4. Partial disruption of the distal quadriceps tendon. Depending on the type of surgery employed (traditional versus minimally invasive) , this can be a normal postoperative finding. 5. Subcutaneous edema along the distal thigh and proximal calf. Electronically Signed   By: Van Clines M.D.   On: 09/15/2018 16:36   Ct Knee Right Wo Contrast  Result Date: 09/15/2018 CLINICAL DATA:  Bilateral knee pain. Sepsis. Knee replacement 2 weeks ago. EXAM: CT OF THE right KNEE WITHOUT CONTRAST TECHNIQUE: Multidetector CT imaging of the right knee was performed according to the standard protocol. Multiplanar CT image reconstructions were also generated. COMPARISON:  None. FINDINGS: Bones/Joint/Cartilage Right total knee prosthesis in place without abnormal lucency along the components the prosthesis to suggest loosening or bony findings of infection. No bony destructive findings are observed. No well-defined periprosthetic fracture. Expected alignment of components of the implant. Moderate-sized knee joint effusion with scattered calcifications along the margins of the suprapatellar bursa. No appreciable gas in the joint. Small well corticated ossicle along the  tibial tubercle, likely from remote Osgood-Schlatter disease. Moderate-sized Baker's cyst. There is evidence of an old fibular shaft fracture which has healed. Small amount of chronic appearing heterotopic ossification along the posterior margin of the soleus muscle. Ligaments Suboptimally assessed by CT. Muscles and Tendons Small amount of heterotopic calcification  along the posterior margin of the soleus muscle. There is some irregularity along the medial distal quadriceps possibly from partial release, correlate with operative history. The defect in the quadriceps is smaller than on the contralateral side. Soft tissues Moderate subcutaneous edema in the distal thigh and proximal calf. Medial subcutaneous varicosities in the proximal calf. IMPRESSION: 1. Moderate-sized knee joint effusion extending into the suprapatellar bursa. Moderate size Baker's cyst. No appreciable gas in the joint or bony destructive findings. No abnormal lucency along the implant. 2. Irregularity in the medial distal quadriceps tendon potentially from partial release or a port into the joint, correlate with operative history. 3. Well corticated ossicle in the distal patellar tendon likely from remote Osgood-Schlatter disease. 4. Moderate subcutaneous edema in the region. Electronically Signed   By: Van Clines M.D.   On: 09/15/2018 16:45   US Renal  Result Date: 09/13/2018 CLINICAL DATA:  Acute renal failure EXAM: RENAL / URINARY TRACT ULTRASOUND COMPLETE COMPARISON:  CT dated 04/30/2015 FINDINGS: Right Kidney: Renal measurements: 15.8 x 6.6 x 6.4 cm = volume: 349 mL. There is a simple cyst measuring 4.1 cm in the interpolar region. There is a simple appearing cyst in the lower pole measuring 4.4 cm. Left Kidney: Renal measurements: 3.5 x 6 x 5.1 cm = volume: 214 mL. Echogenicity within normal limits. No mass or hydronephrosis visualized. Bladder: Poorly evaluated secondary to the presence of a Foley catheter. IMPRESSION: 1. No acute sonographic abnormality detected.  No hydronephrosis. 2. Decompressed urinary bladder which limits evaluation. Electronically Signed   By: Constance Holster M.D.   On: 09/13/2018 19:19   US Venous Img Lower Bilateral  Result Date: 09/20/2018 CLINICAL DATA:  Bilateral lower extremity edema. History of knee surgery. Evaluate for DVT. EXAM: BILATERAL LOWER  EXTREMITY VENOUS DOPPLER ULTRASOUND TECHNIQUE: Gray-scale sonography with graded compression, as well as color Doppler and duplex ultrasound were performed to evaluate the lower extremity deep venous systems from the level of the common femoral vein and including the common femoral, femoral, profunda femoral, popliteal and calf veins including the posterior tibial, peroneal and gastrocnemius veins when visible. The superficial great saphenous vein was also interrogated. Spectral Doppler was utilized to evaluate flow at rest and with distal augmentation maneuvers in the common femoral, femoral and popliteal veins. COMPARISON:  None. FINDINGS: Examination is degraded due to patient body habitus and poor sonographic window. RIGHT LOWER EXTREMITY Common Femoral Vein: No evidence of thrombus. Normal compressibility, respiratory phasicity and response to augmentation. Saphenofemoral Junction: No evidence of thrombus. Normal compressibility and flow on color Doppler imaging. Profunda Femoral Vein: No evidence of thrombus. Normal compressibility and flow on color Doppler imaging. Femoral Vein: No evidence of thrombus. Normal compressibility, respiratory phasicity and response to augmentation. Popliteal Vein: No evidence of thrombus. Normal compressibility, respiratory phasicity and response to augmentation. Calf Veins: Appear patent where imaged. Superficial Great Saphenous Vein: No evidence of thrombus. Normal compressibility. Other Findings: Note is made of a right inguinal lymph node which is not enlarged by size criteria measuring 1 cm greatest short axis diameter and maintains a benign fatty hila. LEFT LOWER EXTREMITY Common Femoral Vein: No evidence of thrombus. Normal compressibility, respiratory phasicity and response to augmentation. Saphenofemoral  Junction: No evidence of thrombus. Normal compressibility and flow on color Doppler imaging. Profunda Femoral Vein: No evidence of thrombus. Normal compressibility and  flow on color Doppler imaging. Femoral Vein: No evidence of thrombus. Normal compressibility, respiratory phasicity and response to augmentation. Popliteal Vein: There is hypoechoic near occlusive thrombus within the distal aspect the left popliteal vein (images 25 through 28). Calf Veins: Appear patent where imaged. Superficial Great Saphenous Vein: No evidence of thrombus. Normal compressibility. Other Findings: Note is made of an approximately 5.2 x 3.2 x 1.5 cm mixed echogenic serpiginous fluid collection with left popliteal fossa compatible with a Baker cyst. IMPRESSION: 1. The examination is positive for near occlusive DVT involving the distal aspect of the left popliteal vein. 2. No evidence of DVT within the right lower extremity. 3. Note made of an approximately 5.2 cm complex left-sided Baker's cyst. Electronically Signed   By: Sandi Mariscal M.D.   On: 09/20/2018 17:31   US Venous Img Lower Bilateral  Result Date: 09/13/2018 CLINICAL DATA:  Leg pain and swelling. EXAM: BILATERAL LOWER EXTREMITY VENOUS DOPPLER ULTRASOUND TECHNIQUE: Gray-scale sonography with graded compression, as well as color Doppler and duplex ultrasound were performed to evaluate the lower extremity deep venous systems from the level of the common femoral vein and including the common femoral, femoral, profunda femoral, popliteal and calf veins including the posterior tibial, peroneal and gastrocnemius veins when visible. The superficial great saphenous vein was also interrogated. Spectral Doppler was utilized to evaluate flow at rest and with distal augmentation maneuvers in the common femoral, femoral and popliteal veins. COMPARISON:  None. FINDINGS: RIGHT LOWER EXTREMITY Common Femoral Vein: No evidence of thrombus. Normal compressibility, respiratory phasicity and response to augmentation. Saphenofemoral Junction: No evidence of thrombus. Normal compressibility and flow on color Doppler imaging. Profunda Femoral Vein: No evidence  of thrombus. Normal compressibility and flow on color Doppler imaging. Femoral Vein: No evidence of thrombus. Normal compressibility, respiratory phasicity and response to augmentation. Popliteal Vein: No evidence of thrombus. Normal compressibility, respiratory phasicity and response to augmentation. Calf Veins: No evidence of thrombus. Normal compressibility and flow on color Doppler imaging. The peroneal vein was not well visualized. Superficial Great Saphenous Vein: No evidence of thrombus. Normal compressibility. Venous Reflux:  None. Other Findings:  None. LEFT LOWER EXTREMITY Common Femoral Vein: No evidence of thrombus. Normal compressibility, respiratory phasicity and response to augmentation. Saphenofemoral Junction: No evidence of thrombus. Normal compressibility and flow on color Doppler imaging. Profunda Femoral Vein: No evidence of thrombus. Normal compressibility and flow on color Doppler imaging. Femoral Vein: No evidence of thrombus. Normal compressibility, respiratory phasicity and response to augmentation. Popliteal Vein: No evidence of thrombus. Normal compressibility, respiratory phasicity and response to augmentation. Calf Veins: No evidence of thrombus. Normal compressibility and flow on color Doppler imaging. The peroneal vein was not well visualized. Superficial Great Saphenous Vein: No evidence of thrombus. Normal compressibility. Venous Reflux:  None. Other Findings:  None. IMPRESSION: No evidence of deep venous thrombosis in either lower extremity. The bilateral peroneal veins were not well visualized. Electronically Signed   By: Constance Holster M.D.   On: 09/13/2018 19:17   Dg Chest Port 1 View  Result Date: 09/21/2018 CLINICAL DATA:  Central line placement EXAM: PORTABLE CHEST 1 VIEW COMPARISON:  09/21/2018 FINDINGS: Interval placement of a left neck multi lumen vascular catheter, tip projecting in the vicinity of the brachiocephalic confluence. Otherwise unchanged low volume AP  portable examination with cardiomegaly. No acute appearing airspace disease. IMPRESSION: Interval placement  of a left neck multi lumen vascular catheter, tip projecting in the vicinity of the brachiocephalic confluence. Otherwise unchanged low volume AP portable examination with cardiomegaly. No acute appearing airspace disease. Electronically Signed   By: Eddie Candle M.D.   On: 09/21/2018 11:38   Dg Chest Port 1 View  Result Date: 09/21/2018 CLINICAL DATA:  Acute respiratory failure EXAM: PORTABLE CHEST 1 VIEW COMPARISON:  09/20/2018 FINDINGS: Cardiac shadow is stable. Lungs are well aerated bilaterally. No focal infiltrate or sizable effusion is seen. No bony abnormality is noted. IMPRESSION: No acute abnormality noted. Electronically Signed   By: Inez Catalina M.D.   On: 09/21/2018 07:46   Dg Chest Port 1 View  Result Date: 09/20/2018 CLINICAL DATA:  Fevers EXAM: PORTABLE CHEST 1 VIEW COMPARISON:  09/14/2018 FINDINGS: Cardiac shadow is stable. Previously seen right jugular central line is been removed. The lungs demonstrate mild bibasilar atelectasis right greater than left. No bony abnormality is seen. IMPRESSION: Mild bibasilar atelectasis. Electronically Signed   By: Inez Catalina M.D.   On: 09/20/2018 11:12   Dg Chest Port 1 View  Result Date: 09/14/2018 CLINICAL DATA:  Central line placement EXAM: PORTABLE CHEST 1 VIEW COMPARISON:  09/14/2018 FINDINGS: There is a newly placed right-sided central venous catheter with tip terminating over the SVC. There is no pneumothorax. The lung volumes are low. The heart size is enlarged. There are hazy opacities bilaterally with prominent interstitial lung markings. There is a small right-sided pleural effusion. There is likely a trace left-sided pleural effusion. IMPRESSION: 1. Right-sided central venous catheter as above with no pneumothorax. 2. Low lung volumes. 3. Borderline enlarged heart. 4. Prominent interstitial lung markings and a few hazy ground-glass  airspace opacities bilaterally are nonspecific but can be seen in patients with pulmonary edema or an atypical infectious process. 5. Developing small right-sided pleural effusion. Electronically Signed   By: Constance Holster M.D.   On: 09/14/2018 13:47   Dg Chest Port 1 View  Result Date: 09/14/2018 CLINICAL DATA:  Dyspnea EXAM: PORTABLE CHEST 1 VIEW COMPARISON:  09/12/2018 chest radiograph. FINDINGS: Low lung volumes. Stable cardiomediastinal silhouette with normal heart size. No pneumothorax. No pleural effusion. No overt pulmonary edema. No acute consolidative airspace disease. IMPRESSION: Low lung volumes, without active cardiopulmonary disease. Electronically Signed   By: Ilona Sorrel M.D.   On: 09/14/2018 10:56   Dg Chest Port 1 View  Result Date: 09/12/2018 CLINICAL DATA:  58 year old male with a history of increasing dizziness EXAM: PORTABLE CHEST 1 VIEW COMPARISON:  11/09/2006 FINDINGS: Cardiomediastinal silhouette unchanged in size and contour. No pneumothorax or pleural effusion. Low lung volumes with coarsened interstitial markings. Linear opacity at the left lung base with no confluent airspace disease. IMPRESSION: Low lung volumes without evidence of acute cardiopulmonary disease. Electronically Signed   By: Corrie Mckusick D.O.   On: 09/12/2018 10:19   Dg Knee Complete 4 Views Left  Result Date: 09/12/2018 CLINICAL DATA:  58 year old male with a history of knee replacement EXAM: LEFT KNEE - COMPLETE 4+ VIEW COMPARISON:  None. FINDINGS: No acute displaced fracture. Surgical changes of arthroplasty left knee. Joint effusion. Surgical staples project within the soft tissues of the midline. Soft tissue swelling at the surgical site. IMPRESSION: Early surgical changes of left knee arthroplasty, with soft tissue swelling and joint effusion. Electronically Signed   By: Corrie Mckusick D.O.   On: 09/12/2018 10:15   US Abdomen Limited Ruq  Result Date: 09/20/2018 CLINICAL DATA:  Liver cirrhosis.   Alcohol withdrawal.  EXAM: ULTRASOUND ABDOMEN LIMITED RIGHT UPPER QUADRANT COMPARISON:  CT AP 04/30/2015 FINDINGS: Gallbladder: Gallbladder sludge noted. No gallstones, wall thickening or sonographic Murphy's sign. Common bile duct: Diameter: 5 mm Liver: The liver is diffusely echogenic compatible with hepatic steatosis. No focal liver lesion. Portal vein is patent on color Doppler imaging with normal direction of blood flow towards the liver. IMPRESSION: 1. Echogenic liver compatible with hepatic steatosis. 2. Gallbladder sludge. Electronically Signed   By: Kerby Moors M.D.   On: 09/20/2018 12:15    Assessment and plan- Patient is a 58 y.o. male with recent knee replacement surgery complicated by septic arthritis, septic shock and acute renal failure requiring dialysis.  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  Hematology consulted for further recommendations  I would recommend continuing argatroban drip for 5 days Overlapping with Coumadin.  Start Coumadin at a lower dose of 5 mg/day to maintain an INR of 2-3.  Argatroban drip can be stopped after 5 days of overlap with Coumadin.  Patient will continue to remain on Coumadin with a target INR of 2-3 for at least 3 months work for potentially 6 months before Coumadin can be stopped.  This can be managed by his primary care doctor Dr. Ancil Boozer as an outpatient.  Given that patient is currently on hemodialysis and potential to come off it in the near future,  I would not recommend newer oral anticoagulants such as Eliquis or Xarelto at this time. This can be considered once he is off dialysis and renal functions normalize.       Visit Diagnosis 1. Septic shock ([place])   2. ARF (acute renal failure) ([place])   3. Acute renal failure, unspecified acute renal failure type ([place])   4. Other specified diabetes  mellitus with other specified complication, unspecified whether long term insulin use ([place])   5. Edema   6. Hydronephrosis   7. SOB (shortness of breath)   8. Encounter for central line placement   9. Liver cirrhosis ([place])   10. Fever   11. Acute respiratory failure ([place])     Dr. Randa Evens, MD, MPH [facility] [phone] 09/26/2018 4:37 PM

## 2018-09-26 NOTE — Consult Note (Signed)
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  Patient Measurements: Height: 6\' 4"  (193 cm) Weight: (!) 338 lb 13.6 oz (153.7 kg) IBW/kg (Calculated) : 86.8  Vital Signs: Temp: 98.7 F (37.1 C) (06/09 0050) Temp Source: Oral (06/09 0050) BP: 148/82 (06/09 0050) Pulse Rate: 96 (06/09 0050)  Labs: Recent Labs    09/23/18 5364 09/23/18 1207  09/24/18 0327 09/25/18 0444 09/25/18 0540 09/26/18 0326  HGB  --   --    < > 7.0* 8.2*  --  8.8*  HCT  --   --   --  22.3* 25.4*  --  26.9*  PLT  --   --   --  99* 150  --  227  APTT  --   --    < > 60*  --  59* 55*  HEPARINUNFRC 0.47  --   --   --   --   --   --   CREATININE  --   --   --  8.30* 7.57*  --   --   CKTOTAL  --  100  --   --   --   --  90   < > = values in this interval not displayed.    Estimated Creatinine Clearance: 16.7 mL/min (A) (by C-G formula based on SCr of 7.57 mg/dL (H)).   Medical History: Past Medical History:  Diagnosis Date  . Allergy   . Anxiety   . Arthritis    knees  . Asthma, mild intermittent, well-controlled   . Chronic obstructive asthma ([place])   . COPD (chronic obstructive pulmonary disease) ([place])   . Depression with anxiety   . Diabetes ([place])   . GERD (gastroesophageal reflux disease)   . HBP (high blood pressure)   . High cholesterol   . Renal cyst, right   . Sleep apnea    CPAP  . Swelling     Medications:  Scheduled:  . aspirin EC  81 mg Oral Daily  . budesonide (PULMICORT) nebulizer solution  0.5 mg Nebulization BID  . Chlorhexidine Gluconate Cloth  6 each Topical Q0600  . feeding supplement (NEPRO CARB STEADY)  237 mL Oral BID BM  . folic acid  1 mg Intravenous Daily  . insulin aspart  0-20 Units Subcutaneous Q4H  . insulin glargine  15 Units Subcutaneous Daily  . mouth rinse  15 mL Mouth Rinse BID  . multivitamin  1 tablet Oral QHS  . sodium chloride flush  10-40 mL Intracatheter Q12H  . sodium chloride flush  3 mL  Intravenous Q12H    Assessment: 58 yr male had B/l knee replacements Sep 01, 2018 was admitted with fever, AKI/ Platelet count has been declining since 6/5. He was started on heparin 6/3. Child Pugh score estimated to be 5-6  Baseline aPTT: 35s 09/23/18@1525 : aPTT 91s 09/23/18@1823 : aPTT 64s 09/23/18@2013 : aPTT 63s 06/07 @ 0330 aPTT 60 s 06/08 @ 0540 aPTT 59s   Goal of Therapy:  aPTT 50-90 seconds Monitor platelets by anticoagulation protocol: Yes   Plan:   0609 @ 0326 aPTT 55. Level remains therapeutic. Will continue current argatroban rate @ 0.5 mcg/kg/min. Continue to monitor CBC and recheck aPTT w/ am labs  Pernell Dupre, PharmD, BCPS Clinical Pharmacist 09/26/2018 5:00 AM

## 2018-09-26 NOTE — Progress Notes (Signed)
Rehab Admissions Coordinator Note:  Per PT recommendation this patient was screened by Jhonnie Garner for appropriateness for an Inpatient Acute Rehab Consult.  At this time, we will need to see how pt progresses once able to transfer OOB. Also noted pt wants to DC home. AC will follow for progress/tolerance prior to requesting CIR consult.   Jhonnie Garner 09/26/2018, 9:14 AM  I can be reached at [phone].

## 2018-09-26 NOTE — Progress Notes (Signed)
Physical Therapy Treatment Patient Details Name: Isaac Cross MRN: [id number] DOB: 11-19-1960 Today's Date: 09/26/2018    History of Present Illness Pt is a 58 y.o. male presenting to hospital 09/12/18 after rolling OOB landing on L knee; pt with increased dizziness x2 days, fevers x3 days, and generalized weakness.  S/p B TKR 09/01/18 in [place] (pt reports discharging home same day).  Pt admitted with sepsis, acute renal failure, and B TKR.  Pt s/p L knee 5/31 and R knee 6/1 I&D with poly exchange.  Pt noted with severe encephalopathy from DT's with extensive ETOH abuse.  L LE DVT noted.  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  HD started 6/5 with L IJ temporary HD catheter.  PMH includes B TKR 09/01/18 in [place], DM, asthma, R vein surgery, COPD.    PT Comments    Pt appearing more confused today (nurse aware): pt oriented to self; pt reported it was August 9th of 2020; pt reported he was "here" when asked multiple times regarding place/where he was; pt inconsistent with understanding of situation (and pt also reporting this was his 1st day of dialysis).  General confusion noted again today; pt closed his eyes for bed level ex's although therapist attempted to cue pt to keep eyes open.  Pt tolerated LE ex's in bed fairly well but session limited d/t transport arrived to take pt to dialysis.  Will continue to focus on strengthening, B knee ROM, and progressive functional mobility as appropriate next session.     Follow Up Recommendations  CIR     Equipment Recommendations  Rolling walker with 5" wheels;3in1 (PT)    Recommendations for Other Services OT consult     Precautions / Restrictions Precautions Precautions: Fall Precaution Comments: L IJ temporary HD catheter Restrictions Weight Bearing Restrictions: Yes RLE Weight Bearing: Weight bearing as tolerated LLE Weight Bearing: Weight bearing as tolerated Other Position/Activity Restrictions: WBAT B LE's    Mobility  Bed  Mobility                  Transfers                    Ambulation/Gait                 Stairs             Wheelchair Mobility    Modified Rankin (Stroke Patients Only)       Balance                                            Cognition Arousal/Alertness: Awake/alert Behavior During Therapy: WFL for tasks assessed/performed Overall Cognitive Status: Impaired/Different from baseline Area of Impairment: Following commands;Safety/judgement;Awareness;Problem solving;Orientation                 Orientation Level: Disoriented to;Time;Situation;Place     Following Commands: Follows multi-step commands inconsistently Safety/Judgement: Decreased awareness of safety;Decreased awareness of deficits Awareness: Anticipatory Problem Solving: Slow processing;Decreased initiation;Difficulty sequencing;Requires verbal cues General Comments: Pt oriented to self; pt reported it was August 9th of 2020; pt reported he was "here" when asked multiple times regarding place; pt inconsistent with understanding of situation (and pt also reporting this was his 1st day of dialysis)      Exercises Total Joint Exercises Ankle Circles/Pumps: AROM;Strengthening;Both;10 reps;Supine Quad Sets: AROM;Strengthening;Both;10 reps;Supine Short Arc Quad: AROM;Strengthening;Both;10 reps;Supine Heel  Slides: AAROM;Strengthening;Both;10 reps;Supine Hip ABduction/ADduction: AAROM;Strengthening;Both;10 reps;Supine Straight Leg Raises: AAROM;Strengthening;Both;10 reps;Supine Goniometric ROM: R knee AROM extension to neutral; L knee AROM extension to 8 degrees short of neutral; unable to assess knee flexion AROM d/t transport arrived to take pt to dialysis    General Comments   Nursing cleared pt for participation in physical therapy.  Pt agreeable to PT session.      Pertinent Vitals/Pain Pain Assessment: No/denies pain Pain Score: 0-No pain Pain  Intervention(s): Limited activity within patient's tolerance;Monitored during session;Repositioned;Other (comment)(polar care in place)    Home Living                      Prior Function            PT Goals (current goals can now be found in the care plan section) Acute Rehab PT Goals Patient Stated Goal: to go home PT Goal Formulation: With patient Time For Goal Achievement: 10/13/18 Potential to Achieve Goals: Fair Progress towards PT goals: Progressing toward goals    Frequency    7X/week      PT Plan Current plan remains appropriate    Co-evaluation              AM-PAC PT "6 Clicks" Mobility   Outcome Measure  Help needed turning from your back to your side while in a flat bed without using bedrails?: A Little Help needed moving from lying on your back to sitting on the side of a flat bed without using bedrails?: A Little Help needed moving to and from a bed to a chair (including a wheelchair)?: A Little Help needed standing up from a chair using your arms (e.g., wheelchair or bedside chair)?: A Little Help needed to walk in hospital room?: A Lot Help needed climbing 3-5 steps with a railing? : A Lot 6 Click Score: 16    End of Session   Activity Tolerance: Patient tolerated treatment well Patient left: in bed;with bed alarm set;with nursing/sitter in room;Other (comment)(B heels elevated via towel rolls; polar care in place; transport present to take pt to dialysis) Nurse Communication: Mobility status;Precautions;Weight bearing status PT Visit Diagnosis: Other abnormalities of gait and mobility (R26.89);Muscle weakness (generalized) (M62.81);Difficulty in walking, not elsewhere classified (R26.2);Pain Pain - Right/Left: Left Pain - part of body: Knee     Time: 6815-9470 PT Time Calculation (min) (ACUTE ONLY): 23 min  Charges:  $Therapeutic Exercise: 23-37 mins                    Leitha Bleak, PT 09/26/18, 10:44 AM [phone]

## 2018-09-26 NOTE — Progress Notes (Signed)
OT Cancellation Note  Patient Details Name: Isaac Cross MRN: [id number] DOB: 07/21/1960   Cancelled Treatment:    Reason Eval/Treat Not Completed: Patient at procedure or test/ unavailable. Pt out for HD. Will re-attempt OT tx at a later date/time as pt is available and medically appropriate.   Jeni Salles, MPH, MS, OTR/L ascom [phone] 09/26/18, 11:12 AM

## 2018-09-26 NOTE — Progress Notes (Signed)
Post HD Assessment    09/26/18 1352  Neurological  Level of Consciousness Alert  Orientation Level Oriented X4  Respiratory  Respiratory Pattern Regular  Chest Assessment Chest expansion symmetrical  Bilateral Breath Sounds Diminished;Fine crackles  Cough None  Cardiac  Pulse Regular  Heart Sounds S1, S2  ECG Monitor Yes  Cardiac Rhythm ST  Vascular  R Radial Pulse +2  L Radial Pulse +2  Edema Generalized  Generalized Edema +2  Psychosocial  Psychosocial (WDL) WDL  Patient Behaviors Calm;Cooperative

## 2018-09-26 NOTE — TOC Progression Note (Signed)
Transition of Care [facility]) - Progression Note    Patient Details  Name: Isaac Cross MRN: [id number] Date of Birth: May 23, 1960  Transition of Care [facility]) CM/SW [place], RN Phone Number: 09/26/2018, 8:27 AM  Clinical Narrative:    Isaac Cross with CIR to please screen the patient for appropriateness   Expected Discharge Plan: [place] Barriers to Discharge: Continued Medical Work up  Expected Discharge Plan and Services Expected Discharge Plan: [place]   Discharge Planning Services: CM Consult Post Acute Care Choice: [place] arrangements for the past 2 months: Single Family Home(Condo)                             [facility] Date [facility] Agency Contacted: 09/18/18 Time [place]: No Name Representative spoke with at [place]: [name]- unsure of when patient will discharge.   Social Determinants of Health (SDOH) Interventions    Readmission Risk Interventions No flowsheet data found.

## 2018-09-26 NOTE — Consult Note (Addendum)
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  Pharmacy also consulted for Warfarin dosing/management for DVT with argatroban bridge  Patient Measurements: Height: 6\' 4"  (193 cm) Weight: (!) 338 lb 13.6 oz (153.7 kg) IBW/kg (Calculated) : 86.8  Vital Signs: Temp: 98.7 F (37.1 C) (06/09 0754) Temp Source: Oral (06/09 0754) BP: 151/91 (06/09 0754) Pulse Rate: 99 (06/09 0754)  Labs: Recent Labs    09/23/18 1207  09/24/18 0327 09/25/18 0444 09/25/18 0540 09/26/18 0326  HGB  --    < > 7.0* 8.2*  --  8.8*  HCT  --   --  22.3* 25.4*  --  26.9*  PLT  --   --  99* 150  --  227  APTT  --    < > 60*  --  59* 55*  CREATININE  --   --  8.30* 7.57*  --   --   CKTOTAL 100  --   --   --   --  90   < > = values in this interval not displayed.    Estimated Creatinine Clearance: 16.7 mL/min (A) (by C-G formula based on SCr of 7.57 mg/dL (H)).   Medical History: Past Medical History:  Diagnosis Date  . Allergy   . Anxiety   . Arthritis    knees  . Asthma, mild intermittent, well-controlled   . Chronic obstructive asthma ([place])   . COPD (chronic obstructive pulmonary disease) ([place])   . Depression with anxiety   . Diabetes ( [place])   . GERD (gastroesophageal reflux disease)   . HBP (high blood pressure)   . High cholesterol   . Renal cyst, right   . Sleep apnea    CPAP  . Swelling     Medications:  Scheduled:  . aspirin EC  81 mg Oral Daily  . budesonide (PULMICORT) nebulizer solution  0.5 mg Nebulization BID  . Chlorhexidine Gluconate Cloth  6 each Topical Q0600  . feeding supplement (NEPRO CARB STEADY)  237 mL Oral BID BM  . folic acid  1 mg Intravenous Daily  . insulin aspart  0-20 Units Subcutaneous Q4H  . insulin glargine  15 Units Subcutaneous Daily  . mouth rinse  15 mL Mouth Rinse BID  . multivitamin  1 tablet Oral QHS  . sodium chloride flush  10-40 mL Intracatheter Q12H  . sodium chloride flush  3 mL  Intravenous Q12H    Assessment: 58 yr male had B/l knee replacements Sep 01, 2018 was admitted with fever, AKI/ Platelet count has been declining since 6/5. He was started on heparin 6/3. 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 Level remains therapeutic.   Will continue current argatroban rate @ 0.5 mcg/kg/min, with a stop date of 5 days from today (6/14) per hematology recommendation.   Continue to monitor CBC and recheck aPTT w/ am labs  Will dose 5mg  warfarin @ 1800 per Hematology recommendation.  Will obtain  baseline INR and check INR daily while on warfarin therapy   Lu Duffel, PharmD, BCPS Clinical Pharmacist 09/26/2018 8:29 AM

## 2018-09-26 NOTE — Progress Notes (Signed)
[facility] [place] NAME: Gerard Cantara    MR#:  [id number]  DATE OF BIRTH:  1960-11-26  SUBJECTIVE:   - more alert and conversive. Wants to go home -T-max is improved    -Renal function worsening, decreased urine output-now started on HD  REVIEW OF SYSTEMS:  Review of Systems  Constitutional: Negative for chills, fever and weight loss.  HENT: Negative for ear discharge, ear pain and nosebleeds.   Eyes: Negative for blurred vision, pain and discharge.  Respiratory: Negative for sputum production, shortness of breath, wheezing and stridor.   Cardiovascular: Negative for chest pain, palpitations, orthopnea and PND.  Gastrointestinal: Negative for abdominal pain, diarrhea, nausea and vomiting.  Genitourinary: Negative for frequency and urgency.  Musculoskeletal: Negative for back pain and joint pain.  Neurological: Positive for weakness. Negative for sensory change, speech change and focal weakness.  Psychiatric/Behavioral: Negative for depression and hallucinations. The patient is not nervous/anxious.    DRUG ALLERGIES:   Allergies  Allergen Reactions  . Lipitor [Atorvastatin] Hives and Itching  . Other   . Viagra  [Sildenafil Citrate]     vision loss    VITALS:  Blood pressure (!) 151/91, pulse 99, temperature 98.7 F (37.1 C), temperature source Oral, resp. rate 18, height 6\' 4"  (1.93 m), weight (!) 153.7 kg, SpO2 96 %.  PHYSICAL EXAMINATION:  Physical Exam   GENERAL:  58 y.o.-year-old patient lying in the bed with no acute distress. Critically ill appearing EYES: Pupils equal, round, reactive to light and accommodation. No scleral icterus.  HEENT: Head atraumatic, normocephalic. Oropharynx and nasopharynx clear.  NECK:  Supple, no jugular venous distention. No thyroid enlargement, no tenderness.  LUNGS: Normal breath sounds bilaterally, no wheezing, rales,rhonchi or crepitation. No use of accessory muscles of respiration.   Decreased bibasilar breath sounds CARDIOVASCULAR: S1, S2 normal. No murmurs, rubs, or gallops. left IJ dialysis catheter  ABDOMEN: Soft, nontender, nondistended. Bowel sounds present. No organomegaly or mass.  EXTREMITIES: Swelling of both legs, knees noted with recent surgery.  No  cyanosis, or clubbing.  Both hands are swollen as well. NEUROLOGIC: Cranial nerves II through XII are intact. Moving all extremities well PSYCHIATRIC: patient is alert and oriented x2 SKIN: No obvious rash, lesion, or ulcer.    LABORATORY PANEL:   CBC Recent Labs  Lab 09/26/18 0326  WBC 10.4  HGB 8.8*  HCT 26.9*  PLT 227   ------------------------------------------------------------------------------------------------------------------  Chemistries  Recent Labs  Lab 09/25/18 0444  NA 142  K 3.4*  CL 103  CO2 24  GLUCOSE 234*  BUN 52*  CREATININE 7.57*  CALCIUM 7.7*  MG 1.9  AST 56*  ALT 37  ALKPHOS 64  BILITOT 0.7   ------------------------------------------------------------------------------------------------------------------  Cardiac Enzymes No results for input(s): TROPONINI in the last 168 hours. ------------------------------------------------------------------------------------------------------------------  RADIOLOGY:  No results found.  EKG:   Orders placed or performed during the hospital encounter of 09/12/18  . EKG 12-Lead  . EKG 12-Lead    ASSESSMENT AND PLAN:    EdwardGradyis a58 y.o.malewith a known history of arthritis, COPD not on home oxygen, hypertension, sleep apnea, non-insulin-dependent diabetes mellitus presents to hospital secondary to dizziness, weakness and a fall  1.Sepsis- with ongoing fevers. T max is improving now   -s/p Recent bilateral knee replacement surgeries as outpatient and now bilateral knee washouts done this adm. - fluid Cultures are negative so far. - Fungal cultures negative -Appreciate ID consult.   -Patient on zosyn,  doxycycline and anidulafungin (last dose today) ID to d/w Dr Holley Raring if he can get abxs during dialysis -Received IV albumin as well--now d/ced -now off IV stress dose steroids  2. Acute Oliguric renal failure-likely ATN from sepsis and hypotension - appreciate nephrology input. -Hemodialysis started 09/22/2018 -Hold lisinopril and metformin and other nephrotoxins.     3. Acute encephalopathy-secondary to metabolic causes, sepsis and uremia.   -  No focal deficits -improving mentation  4. Left popliteal vein DVT - was on heparin drip--platelets dropping--IV argatroban -platelet count now normal -HIT antibody 1.89  5. Diabetes mellitus - start lantus and cont ssi -Hold metformin given worsening renal failure.  6. DVT prophylaxis- argatroban drip  Physical therapy evaluation noted  Spoke with wife [name] and updated today  CODE STATUS: Full code  TOTAL TIME TAKING CARE OF THIS PATIENT: 28 minutes.   POSSIBLE D/C IN ?? DAYS, DEPENDING ON CLINICAL CONDITION.   Fritzi Mandes M.D on 09/26/2018 at 8:10 AM  Between 7am to 6pm - Pager - [phone] After 6pm go to [email or web address] - password [email or web address]  Office  [phone]  CC: Primary care physician; Steele Sizer, MD

## 2018-09-26 NOTE — NC FL2 (Addendum)
[place] LEVEL OF CARE SCREENING TOOL     IDENTIFICATION  Patient Name: AYAD NIEMAN Birthdate: 05-Aug-1960 Sex: male Admission Date (Current Location): 09/12/2018  [place] and [place] Number:  Engineering geologist and Address:  [facility], [address] [place], [place] [address]      Provider Number: [id number]  Attending Physician Name and Address:  Fritzi Mandes, MD  Relative Name and Phone Number:       Current Level of Care: Hospital Recommended Level of Care: [place] Prior Approval Number:    Date Approved/Denied:   PASRR Number:    Discharge Plan: SNF    Current Diagnoses: Patient Active Problem List   Diagnosis Date Noted  . Acute renal failure ([place])   . Septic shock ([place])   . Sepsis ([facility]) 09/12/2018  . History of total knee arthroplasty 09/04/2018  . Coronary atherosclerosis due to calcified coronary lesion 12/02/2017  . Atherosclerosis of abdominal aorta ([place]) 06/17/2015  . Fatty liver disease, nonalcoholic 94/49/6759  . Renal cyst, right 06/17/2015  . BPH (benign prostatic hyperplasia) 05/25/2015  . Microscopic hematuria 05/25/2015  . Second degree hemorrhoids   . Diverticulosis of large intestine without diverticulitis   . Asthma, mild intermittent, well-controlled 10/16/2014  . Allergic rhinitis 10/13/2014  . ED (erectile dysfunction) of organic origin 07/22/2014  . Gastro-esophageal reflux disease without esophagitis 07/22/2014  . Benign hypertension 07/22/2014  . 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[place]) 07/22/2014  . Adult BMI 30+ 07/22/2014  . Chronic obstructive asthma ([place]) 07/22/2014  . Depression with anxiety 07/22/2014  . Dyslipidemia 07/22/2014  . Genital herpes 07/22/2014  . 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 Osteoarthritis of both knees 07/22/2014  . Obstructive apnea 05/14/2013  . 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 Blood in the urine 09/20/2006     Orientation RESPIRATION BLADDER Height & Weight     Self, Time, Place  Normal Incontinent, Indwelling catheter Weight: (!) 338 lb 13.6 oz (153.7 kg) Height:  6\' 4"  (193 cm)  BEHAVIORAL SYMPTOMS/MOOD NEUROLOGICAL BOWEL NUTRITION STATUS      Continent Diet(Diet: Carb Modified. )  AMBULATORY STATUS COMMUNICATION OF NEEDS Skin   Extensive Assist Verbally Surgical wounds(09/17/2018 Bilateral Knee Incisions)                       Personal Care Assistance Level of Assistance  Bathing, Feeding, Dressing Bathing Assistance: Maximum assistance Feeding assistance: Limited assistance Dressing Assistance: Maximum assistance     Functional Limitations Info  Sight, Hearing, Speech Sight Info: Adequate Hearing Info: Adequate Speech Info: Adequate    SPECIAL CARE FACTORS FREQUENCY  PT (By licensed PT), OT (By licensed OT)   Dialysis      PT Frequency: (5) OT Frequency: (5)            Contractures      Additional Factors Info  Code Status, Allergies Code Status Info: (Full Code. ) Allergies Info: (Heparin, Lipitor Atorvastatin, Other, Viagra  Sildenafil Citrate)           Current Medications (09/26/2018):  This is the current hospital active medication list Current Facility-Administered Medications  Medication Dose Route Frequency Provider Last Rate Last Dose  . 0.9 %  sodium chloride infusion   Intravenous PRN Flora Lipps, MD   Stopped at 09/26/18 0548  . 0.9 %  sodium chloride infusion  250 mL Intravenous PRN Flora Lipps, MD      . acetaminophen (TYLENOL) tablet  650 mg  650 mg Oral Q6H PRN Lovell Sheehan, MD   650 mg at 09/19/18 9147   Or  . acetaminophen (TYLENOL) suppository 650 mg  650 mg Rectal Q6H PRN Lovell Sheehan, MD   650 mg at 09/20/18 0444  . albuterol (PROVENTIL) (2.5 MG/3ML) 0.083% nebulizer solution 2.5 mg  2.5 mg Nebulization Q6H PRN Lovell Sheehan, MD      . alum & mag hydroxide-simeth (MAALOX/MYLANTA) 200-200-20 MG/5ML suspension 30 mL  30 mL  Oral Q4H PRN Fritzi Mandes, MD      . anidulafungin (ERAXIS) 100 mg in sodium chloride 0.9 % 100 mL IVPB  100 mg Intravenous Q24H Tsosie Billing, MD   Stopped at 09/25/18 2339  . argatroban 1 mg/mL infusion  0.5 mcg/kg/min Intravenous Continuous Dallie Piles, RPH 4.51 mL/hr at 09/26/18 0623 0.5 mcg/kg/min at 09/26/18 8295  . aspirin EC tablet 81 mg  81 mg Oral Daily Lovell Sheehan, MD   81 mg at 09/25/18 6213  . budesonide (PULMICORT) nebulizer solution 0.5 mg  0.5 mg Nebulization BID Flora Lipps, MD   0.5 mg at 09/26/18 0830  . Chlorhexidine Gluconate Cloth 2 % PADS 6 each  6 each Topical Q0600 Lovell Sheehan, MD   6 each at 09/26/18 0559  . doxycycline (VIBRAMYCIN) 100 mg in sodium chloride 0.9 % 250 mL IVPB  100 mg Intravenous Q12H Tsosie Billing, MD   Stopped at 09/26/18 0004  . feeding supplement (NEPRO CARB STEADY) liquid 237 mL  237 mL Oral BID BM Flora Lipps, MD 237 mL/hr at 09/25/18 0951 237 mL at 09/25/18 0951  . fentaNYL (SUBLIMAZE) injection 12.5 mcg  12.5 mcg Intravenous Q8H PRN Awilda Bill, NP   12.5 mcg at 09/22/18 1756  . folic acid injection 1 mg  1 mg Intravenous Daily Awilda Bill, NP   1 mg at 09/25/18 1105  . HYDROcodone-acetaminophen (NORCO/VICODIN) 5-325 MG per tablet 1-2 tablet  1-2 tablet Oral Q4H PRN Lovell Sheehan, MD   2 tablet at 09/19/18 0450  . insulin aspart (novoLOG) injection 0-20 Units  0-20 Units Subcutaneous Q4H Tukov-Yual, Magdalene S, NP   3 Units at 09/25/18 2114  . insulin glargine (LANTUS) injection 15 Units  15 Units Subcutaneous Daily Fritzi Mandes, MD   15 Units at 09/25/18 1729  . iohexol (OMNIPAQUE) 240 MG/ML injection 50 mL  50 mL Oral Once PRN Awilda Bill, NP      . ipratropium-albuterol (DUONEB) 0.5-2.5 (3) MG/3ML nebulizer solution 3 mL  3 mL Nebulization Q6H PRN Flora Lipps, MD   3 mL at 09/25/18 0721  . MEDLINE mouth rinse  15 mL Mouth Rinse BID Flora Lipps, MD   15 mL at 09/25/18 0951  . menthol-cetylpyridinium  (CEPACOL) lozenge 3 mg  1 lozenge Oral PRN Lovell Sheehan, MD       Or  . phenol (CHLORASEPTIC) mouth spray 1 spray  1 spray Mouth/Throat PRN Lovell Sheehan, MD      . menthol-cetylpyridinium (CEPACOL) lozenge 3 mg  1 lozenge Oral PRN Lovell Sheehan, MD       Or  . phenol (CHLORASEPTIC) mouth spray 1 spray  1 spray Mouth/Throat PRN Lovell Sheehan, MD      . metoCLOPramide (REGLAN) tablet 5-10 mg  5-10 mg Oral Q8H PRN Lovell Sheehan, MD       Or  . metoCLOPramide (REGLAN) injection 5-10 mg  5-10 mg Intravenous Q8H PRN Lovell Sheehan,  MD      . multivitamin (RENA-VIT) tablet 1 tablet  1 tablet Oral QHS Flora Lipps, MD   1 tablet at 09/25/18 2113  . ondansetron (ZOFRAN) tablet 4 mg  4 mg Oral Q6H PRN Lovell Sheehan, MD       Or  . ondansetron Continuous [facility]) injection 4 mg  4 mg Intravenous Q6H PRN Lovell Sheehan, MD      . piperacillin-tazobactam (ZOSYN) IVPB 3.375 g  3.375 g Intravenous Q12H Gladstone Lighter, MD 12.5 mL/hr at 09/26/18 0548 3.375 g at 09/26/18 0548  . sodium chloride flush (NS) 0.9 % injection 10-40 mL  10-40 mL Intracatheter Q12H Kolluru, Sarath, MD      . sodium chloride flush (NS) 0.9 % injection 10-40 mL  10-40 mL Intracatheter PRN Kolluru, Sarath, MD      . sodium chloride flush (NS) 0.9 % injection 3 mL  3 mL Intravenous Q12H Flora Lipps, MD   3 mL at 09/25/18 0934  . sodium chloride flush (NS) 0.9 % injection 3 mL  3 mL Intravenous PRN Flora Lipps, MD   3 mL at 09/24/18 0805     Discharge Medications: Please see discharge summary for a list of discharge medications.  Relevant Imaging Results:  Relevant Lab Results:   Additional Information (SSN: [id number])  Cyndy Braver, Veronia Beets, LCSW

## 2018-09-26 NOTE — Progress Notes (Signed)
HD Tx completed, tolerated well, no UF today.    09/26/18 1324  Vital Signs  Pulse Rate (!) 108  Resp 20  BP (!) 149/80  Oxygen Therapy  SpO2 98 %  During Hemodialysis Assessment  HD Safety Checks Performed Yes  KECN 34.6 KECN  Dialysis Fluid Bolus Normal Saline  Bolus Amount (mL) 250 mL  Intra-Hemodialysis Comments Tx completed

## 2018-09-26 NOTE — Progress Notes (Signed)
HD Tx started w/o complication   01/48/40 3979  Vital Signs  Pulse Rate (!) 102  Pulse Rate Source Monitor  Resp (!) 21  BP (!) 141/78  BP Location Right Arm  BP Method Automatic  Patient Position (if appropriate) Lying  Oxygen Therapy  SpO2 98 %  O2 Device Room Air  During Hemodialysis Assessment  Blood Flow Rate (mL/min) 250 mL/min  Arterial Pressure (mmHg) -110 mmHg  Venous Pressure (mmHg) 90 mmHg  Transmembrane Pressure (mmHg) 50 mmHg  Ultrafiltration Rate (mL/min) 200 mL/min  Dialysate Flow Rate (mL/min) 500 ml/min  Conductivity: Machine  14  HD Safety Checks Performed Yes  Dialysis Fluid Bolus Normal Saline  Bolus Amount (mL) 250 mL  Intra-Hemodialysis Comments Tx initiated

## 2018-09-26 NOTE — Progress Notes (Signed)
Pre HD Tx   09/26/18 1037  Hand-Off documentation  Report given to (Full Name) Beatris Ship, RN   Report received from (Full Name) Yves Dill, RN   Vital Signs  Temp 98.7 F (37.1 C)  Temp Source Oral  Pulse Rate 99  Pulse Rate Source Monitor  BP (!) 153/90  BP Location Right Arm  BP Method Automatic  Patient Position (if appropriate) Lying  Oxygen Therapy  SpO2 98 %  O2 Device Room Air  Pain Assessment  Pain Scale 0-10  Pain Score 0  Dialysis Weight  Weight  (una)  Time-Out for Hemodialysis  What Procedure? HD  Pt Identifiers(min of two) First/Last Name;MRN/Account#  Correct Site? Yes  Correct Side? Yes  Correct Procedure? Yes  Consents Verified? Yes  Rad Studies Available? N/A  Safety Precautions Reviewed? Yes  Engineer, civil (consulting) Number 5  Station Number 1  UF/Alarm Test Passed  Conductivity: Meter 14  Conductivity: Machine  14.1  pH 7.4  Reverse Osmosis Main  Normal Saline Lot Number [id number]  Dialyzer Lot Number [id number]  Disposable Set Lot Number [id number]  Machine Temperature 98.6 F (37 C)  Musician and Audible Yes  Blood Lines Intact and Secured Yes  Pre Treatment Patient Checks  Vascular access used during treatment Catheter  Hepatitis B Surface Antigen Results Negative  Date Hepatitis B Surface Antigen Drawn 09/21/18  Hepatitis B Surface Antibody  (<10)  Date Hepatitis B Surface Antibody Drawn 09/21/18  Hemodialysis Consent Verified Yes  Hemodialysis Standing Orders Initiated Yes  ECG (Telemetry) Monitor On Yes  Prime Ordered Normal Saline  Length of  DialysisTreatment -hour(s) 2.5 Hour(s)  Dialysis Treatment Comments Na 140  Dialyzer Elisio 17H NR  Dialysate 4K  Dialysis Anticoagulant None  Dialysate Flow Ordered 500  Blood Flow Rate Ordered 250 mL/min  Ultrafiltration Goal 0 Liters  Dialysis Blood Pressure Support Ordered Normal Saline  Education / Care Plan  Dialysis Education Provided Yes  Documented Education in Care  Plan Yes  Hemodialysis Catheter Left Internal jugular Triple lumen Temporary  Placement Date/Time: 09/21/18 1100   Placed prior to admission: No  Time Out: Correct patient;Correct procedure;Correct site  Maximum sterile barrier precautions: Hand hygiene;Sterile gown;Cap;Sterile gloves;Large sterile sheet;Mask  Site Prep: Chlorh...  Site Condition No complications  Blue Lumen Status Blood return noted  Red Lumen Status Blood return noted  Purple Lumen Status Capped (Central line)  Dressing Type Biopatch;Occlusive  Dressing Status Dry;Clean  Interventions Dressing reinforced  Drainage Description None  Dressing Change Due 09/28/18

## 2018-09-27 ENCOUNTER — Other Ambulatory Visit: Payer: Self-pay | Admitting: Family Medicine

## 2018-09-27 DIAGNOSIS — R350 Frequency of micturition: Secondary | ICD-10-CM

## 2018-09-27 LAB — BASIC METABOLIC PANEL
Anion gap: 12 (ref 5–15)
BUN: 52 mg/dL — ABNORMAL HIGH (ref 6–20)
CO2: 25 mmol/L (ref 22–32)
Calcium: 7.6 mg/dL — ABNORMAL LOW (ref 8.9–10.3)
Chloride: 101 mmol/L (ref 98–111)
Creatinine, Ser: 8.38 mg/dL — ABNORMAL HIGH (ref 0.61–1.24)
GFR calc Af Amer: 7 mL/min — ABNORMAL LOW (ref >60)
GFR calc non Af Amer: 6 mL/min — ABNORMAL LOW (ref >60)
Glucose, Bld: 121 mg/dL — ABNORMAL HIGH (ref 70–99)
Potassium: 3.4 mmol/L — ABNORMAL LOW (ref 3.5–5.1)
Sodium: 138 mmol/L (ref 135–145)

## 2018-09-27 LAB — PROTIME-INR
INR: 2.2 — ABNORMAL HIGH (ref 0.8–1.2)
Prothrombin Time: 24.2 seconds — ABNORMAL HIGH (ref 11.4–15.2)

## 2018-09-27 LAB — CBC
HCT: 29.7 % — ABNORMAL LOW (ref 39.0–52.0)
Hemoglobin: 9.7 g/dL — ABNORMAL LOW (ref 13.0–17.0)
MCH: 27.1 pg (ref 26.0–34.0)
MCHC: 32.7 g/dL (ref 30.0–36.0)
MCV: 83 fL (ref 80.0–100.0)
Platelets: 274 10*3/uL (ref 150–400)
RBC: 3.58 MIL/uL — ABNORMAL LOW (ref 4.22–5.81)
RDW: 15.6 % — ABNORMAL HIGH (ref 11.5–15.5)
WBC: 12.1 10*3/uL — ABNORMAL HIGH (ref 4.0–10.5)
nRBC: 0 % (ref 0.0–0.2)

## 2018-09-27 LAB — GLUCOSE, CAPILLARY
Glucose-Capillary: 101 mg/dL — ABNORMAL HIGH (ref 70–99)
Glucose-Capillary: 111 mg/dL — ABNORMAL HIGH (ref 70–99)
Glucose-Capillary: 113 mg/dL — ABNORMAL HIGH (ref 70–99)
Glucose-Capillary: 113 mg/dL — ABNORMAL HIGH (ref 70–99)
Glucose-Capillary: 120 mg/dL — ABNORMAL HIGH (ref 70–99)
Glucose-Capillary: 132 mg/dL — ABNORMAL HIGH (ref 70–99)

## 2018-09-27 LAB — APTT: aPTT: 71 seconds — ABNORMAL HIGH (ref 24–36)

## 2018-09-27 MED ORDER — WARFARIN SODIUM 5 MG PO TABS
5.0000 mg | ORAL_TABLET | Freq: Once | ORAL | Status: AC
  Administered 2018-09-27: 18:00:00 5 mg via ORAL
  Filled 2018-09-27: qty 1

## 2018-09-27 MED ORDER — INSULIN GLARGINE 100 UNIT/ML ~~LOC~~ SOLN
10.0000 [IU] | Freq: Every day | SUBCUTANEOUS | Status: DC
  Administered 2018-09-28 – 2018-09-29 (×2): 10 [IU] via SUBCUTANEOUS
  Filled 2018-09-27 (×3): qty 0.1

## 2018-09-27 MED ORDER — RENA-VITE PO TABS
1.0000 | ORAL_TABLET | Freq: Every day | ORAL | Status: DC
  Administered 2018-09-27 – 2018-10-03 (×6): 1 via ORAL
  Filled 2018-09-27 (×8): qty 1

## 2018-09-27 MED ORDER — SODIUM CHLORIDE 0.9 % IV SOLN: 2.0000 g | INTRAVENOUS | Status: DC

## 2018-09-27 MED ORDER — SODIUM CHLORIDE 0.9 % IV SOLN
1.0000 g | INTRAVENOUS | Status: DC
  Filled 2018-09-27: qty 1

## 2018-09-27 MED ORDER — SODIUM CHLORIDE 0.9 % IV SOLN
700.0000 mg | INTRAVENOUS | Status: DC
  Administered 2018-09-27 – 2018-10-07 (×6): 700 mg via INTRAVENOUS
  Filled 2018-09-27 (×6): qty 14

## 2018-09-27 MED ORDER — FOLIC ACID 1 MG PO TABS
1.0000 mg | ORAL_TABLET | Freq: Every day | ORAL | Status: DC
  Administered 2018-09-27 – 2018-10-12 (×16): 1 mg via ORAL
  Filled 2018-09-27 (×16): qty 1

## 2018-09-27 MED ORDER — SODIUM CHLORIDE 0.9 % IV SOLN
2.0000 g | Freq: Once | INTRAVENOUS | Status: AC
  Administered 2018-09-27: 2 g via INTRAVENOUS
  Filled 2018-09-27: qty 2

## 2018-09-27 MED ORDER — DOXYCYCLINE HYCLATE 100 MG PO TABS
100.0000 mg | ORAL_TABLET | Freq: Two times a day (BID) | ORAL | Status: DC
  Administered 2018-09-27: 100 mg via ORAL
  Filled 2018-09-27: qty 1

## 2018-09-27 MED ORDER — INSULIN ASPART 100 UNIT/ML ~~LOC~~ SOLN: 0.0000 [IU] | Freq: Every day | SUBCUTANEOUS | Status: DC

## 2018-09-27 MED ORDER — INSULIN ASPART 100 UNIT/ML ~~LOC~~ SOLN
0.0000 [IU] | Freq: Three times a day (TID) | SUBCUTANEOUS | Status: DC
  Administered 2018-09-27 – 2018-09-28 (×2): 1 [IU] via SUBCUTANEOUS
  Administered 2018-09-30: 18:00:00 2 [IU] via SUBCUTANEOUS
  Administered 2018-10-03 – 2018-10-07 (×2): 1 [IU] via SUBCUTANEOUS
  Administered 2018-10-07: 12:00:00 2 [IU] via SUBCUTANEOUS
  Administered 2018-10-07 – 2018-10-08 (×2): 1 [IU] via SUBCUTANEOUS
  Administered 2018-10-08 – 2018-10-10 (×5): 2 [IU] via SUBCUTANEOUS
  Administered 2018-10-11 – 2018-10-12 (×3): 1 [IU] via SUBCUTANEOUS
  Filled 2018-09-27 (×16): qty 1

## 2018-09-27 MED ORDER — TUBERCULIN PPD 5 UNIT/0.1ML ID SOLN
5.0000 [IU] | Freq: Once | INTRADERMAL | Status: AC
  Administered 2018-09-28: 5 [IU] via INTRADERMAL
  Filled 2018-09-27: qty 0.1

## 2018-09-27 NOTE — Progress Notes (Signed)
[facility]  ROUNDING NOTE   Subjective:  Patient doing better. UOP increased to 1 liter over the preceding 24 hours.  Cr down to 8.38.   Objective:  Vital signs in last 24 hours:  Temp:  [98.3 F (36.8 C)-98.8 F (37.1 C)] 98.3 F (36.8 C) (06/10 0805) Pulse Rate:  [102-108] 108 (06/10 0805) Resp:  [19-20] 20 (06/10 0805) BP: (150-163)/(79-96) 150/96 (06/10 0805) SpO2:  [96 %-98 %] 98 % (06/10 0805)  Weight change:  Filed Weights   09/23/18 1300 09/24/18 1640 09/24/18 1930  Weight: (!) 151.4 kg (!) 153.7 kg (!) 153.7 kg    Intake/Output: I/O last 3 completed shifts: In: 1722.6 [P.O.:240; I.V.:190.2; IV Piggyback:1292.4] Out: 1550 [Urine:1550]   Intake/Output this shift:  Total I/O In: 390.2 [P.O.:240; I.V.:98.9; IV Piggyback:51.3] Out: -   Physical Exam: General: NAD  Head: Moist oral mucosal membranes  Eyes: Anicteric   Lungs:  Scatterd rhonchi, normal effort  Heart: S1S2 no rubs  Abdomen:  Soft, nontender, obese, distended  Extremities: + peripheral edema., b/l knee dressing  Neurologic: Awake, alert, conversant  Skin: warm        Basic Metabolic Panel: Recent Labs  Lab 09/21/18 0120  09/22/18 2013 09/23/18 0407 09/24/18 0327 09/25/18 0444 09/26/18 1027 09/27/18 0512  NA 139   < > 142 144 144 142 140 138  K 4.3   < > 3.2* 3.2* 3.1* 3.4* 2.9* 3.4*  CL 111   < > 107 109 105 103 102 101  CO2 17*   < > 22 23 28 24 25 25   GLUCOSE 192*   < > 222* 104* 158* 234* 100* 121*  BUN 37*   < > 55* 60* 56* 52* 66* 52*  CREATININE 7.73*   < > 8.89* 9.18* 8.30* 7.57* 9.23* 8.38*  CALCIUM 8.3*   < > 7.9* 8.1* 7.6* 7.7* 7.7* 7.6*  MG 2.4  --  2.2 2.2  --  1.9  --   --   PHOS  --   --  4.0 4.1 3.7 4.5 4.4  --    < > = values in this interval not displayed.    Liver Function Tests: Recent Labs  Lab 09/22/18 0411 09/23/18 0407 09/24/18 0327 09/25/18 0444 09/26/18 1027  AST 44* 48*  --  56*  --   ALT 27 28  --  37  --   ALKPHOS 62 59  --  64   --   BILITOT 0.8 0.5  --  0.7  --   PROT 6.1* 6.4*  --  6.0*  --   ALBUMIN 1.8* 1.9* 1.9* 2.0* 1.8*   No results for input(s): LIPASE, AMYLASE in the last 168 hours. Recent Labs  Lab 09/22/18 2014  AMMONIA 10    CBC: Recent Labs  Lab 09/21/18 0120  09/22/18 0411  09/23/18 0407 09/24/18 0327 09/25/18 0444 09/26/18 0326 09/27/18 0512  WBC 11.1*  --  10.3   < > 9.9 8.6 9.8 10.4 12.1*  NEUTROABS 8.0*  --  8.5*  --  7.5  --   --   --   --   HGB 8.7*   < > 7.8*   < > 7.7* 7.0* 8.2* 8.8* 9.7*  HCT 28.0*   < > 24.7*   < > 23.8* 22.3* 25.4* 26.9* 29.7*  MCV 86.2  --  84.3   < > 82.4 84.2 83.8 83.5 83.0  PLT 276  --  124*   < > 102* 99*  150 227 274   < > = values in this interval not displayed.    Cardiac Enzymes: Recent Labs  Lab 09/23/18 1207 09/26/18 0326  CKTOTAL 100 90    BNP: Invalid input(s): POCBNP  CBG: Recent Labs  Lab 09/26/18 1946 09/26/18 2359 09/27/18 0351 09/27/18 0804 09/27/18 1206  GLUCAP 142* 113* 111* 120* 132*    Microbiology: Results for orders placed or performed during the hospital encounter of 09/12/18  Blood Culture (routine x 2)     Status: None   Collection Time: 09/12/18  9:56 AM  Result Value Ref Range Status   Specimen Description BLOOD LEFT HAND  Final   Special Requests   Final    BOTTLES DRAWN AEROBIC AND ANAEROBIC Blood Culture adequate volume   Culture   Final    NO GROWTH 5 DAYS Performed at [facility], [address] [place], [place] [address]    Report Status 09/17/2018 FINAL  Final  Urine culture     Status: None   Collection Time: 09/12/18 10:01 AM  Result Value Ref Range Status   Specimen Description   Final    URINE, RANDOM Performed at [facility], [address] [place], [place] [address]    Special Requests   Final    NONE Performed at [facility], [address], [place], [place] [address]    Culture   Final    NO GROWTH Performed at [facility], [place]  [address] [place],  [address]    Report Status 09/13/2018 FINAL  Final  SARS Coronavirus 2 (CEPHEID- Performed in [place] hospital lab), Hosp Order     Status: None   Collection Time: 09/12/18 10:02 AM  Result Value Ref Range Status   SARS Coronavirus 2 NEGATIVE NEGATIVE Final    Comment: (NOTE) If result is NEGATIVE SARS-CoV-2 target nucleic acids are NOT DETECTED. The SARS-CoV-2 RNA is generally detectable in upper and lower  respiratory specimens during the acute phase of infection. The lowest  concentration of SARS-CoV-2 viral copies this assay can detect is 250  copies / mL. A negative result does not preclude SARS-CoV-2 infection  and should not be used as the sole basis for treatment or other  patient management decisions.  A negative result may occur with  improper specimen collection / handling, submission of specimen other  than nasopharyngeal swab, presence of viral mutation(s) within the  areas targeted by this assay, and inadequate number of viral copies  (<250 copies / mL). A negative result must be combined with clinical  observations, patient history, and epidemiological information. If result is POSITIVE SARS-CoV-2 target nucleic acids are DETECTED. The SARS-CoV-2 RNA is generally detectable in upper and lower  respiratory specimens dur ing the acute phase of infection.  Positive  results are indicative of active infection with SARS-CoV-2.  Clinical  correlation with patient history and other diagnostic information is  necessary to determine patient infection status.  Positive results do  not rule out bacterial infection or co-infection with other viruses. If result is PRESUMPTIVE POSTIVE SARS-CoV-2 nucleic acids MAY BE PRESENT.   A presumptive positive result was obtained on the submitted specimen  and confirmed on repeat testing.  While 2019 novel coronavirus  (SARS-CoV-2) nucleic acids may be present in the submitted sample  additional confirmatory testing  may be necessary for epidemiological  and / or clinical management purposes  to differentiate between  SARS-CoV-2 and other Sarbecovirus currently known to infect humans.  If clinically indicated additional testing with an alternate test  methodology [id number]) is advised. The SARS-CoV-2 RNA is generally  detectable in upper and lower respiratory sp ecimens during the acute  phase of infection. The expected result is Negative. Fact Sheet for Patients:  StrictlyIdeas.no Fact Sheet for Healthcare Providers: BankingDealers.co.za This test is not yet approved or cleared by the [place] FDA and has been authorized for detection and/or diagnosis of SARS-CoV-2 by FDA under an Emergency Use Authorization (EUA).  This EUA will remain in effect (meaning this test can be used) for the duration of the COVID-19 declaration under Section 564(b)(1) of the Act, 21 U.S.C. section 360bbb-3(b)(1), unless the authorization is terminated or revoked sooner. Performed at [facility], [address] [place], [place] [address]   Blood Culture (routine x 2)     Status: None   Collection Time: 09/12/18 10:06 AM  Result Value Ref Range Status   Specimen Description BLOOD LEFT ANTECUBITAL  Final   Special Requests   Final    BOTTLES DRAWN AEROBIC AND ANAEROBIC Blood Culture adequate volume   Culture   Final    NO GROWTH 5 DAYS Performed at [facility], [address] [place], [place] [address]    Report Status 09/17/2018 FINAL  Final  MRSA PCR Screening     Status: None   Collection Time: 09/12/18  7:04 PM  Result Value Ref Range Status   MRSA by PCR NEGATIVE NEGATIVE Final    Comment:        The GeneXpert MRSA Assay (FDA approved for NASAL specimens only), is one component of a comprehensive MRSA colonization surveillance program. It is not intended to diagnose MRSA infection nor to guide or monitor treatment for MRSA  infections. Performed at [facility], [address] [place], [place] [address]   C difficile quick scan w PCR reflex     Status: None   Collection Time: 09/14/18  2:21 PM  Result Value Ref Range Status   C Diff antigen NEGATIVE NEGATIVE Final   C Diff toxin NEGATIVE NEGATIVE Final   C Diff interpretation No C. difficile detected.  Final    Comment: Performed at [facility], [address] [place], [place] [address]  Group A Strep by PCR     Status: None   Collection Time: 09/15/18  1:53 PM  Result Value Ref Range Status   Group A Strep by PCR NOT DETECTED NOT DETECTED Final    Comment: Performed at [facility], [address], [place], [place] [address]  Body fluid culture     Status: None   Collection Time: 09/15/18  5:13 PM  Result Value Ref Range Status   Specimen Description   Final    KNEE Performed at [facility], [address], [place], [place] [address]    Special Requests   Final    NONE Performed at [facility], [address] [place], [place] [address]    Gram Stain   Final    RARE RBCS FEW WBC SEEN NO ORGANISMS SEEN Performed at [facility] [place], [address], [place], [place] [address]    Culture   Final    NO GROWTH 3 DAYS Performed at [facility], [place] [address] [place], [place] [address]    Report Status 09/19/2018 FINAL  Final  Body fluid culture     Status: None   Collection Time: 09/16/18  8:55 AM  Result Value Ref  Range Status   Specimen Description KNEE RIGHT  Final   Special Requests NONE  Final   Gram Stain   Final    RARE WBC PRESENT, PREDOMINANTLY PMN NO ORGANISMS SEEN    Culture   Final    NO GROWTH 3 DAYS Performed at [facility], [place] [address], [place], [place] [address]    Report Status 09/20/2018 FINAL  Final  Aerobic/Anaerobic Culture (surgical/deep wound)     Status: None   Collection Time: 09/17/18 11:32 AM  Result Value Ref Range Status    Specimen Description   Final    TISSUE LEFT KNEE 1 Performed at [facility], [address] [place], [place] [address]    Special Requests PATIENT ON FOLLOWING MAXIPIME VANCOMYCIN  Final   Gram Stain   Final    FEW WBC PRESENT,BOTH PMN AND MONONUCLEAR NO ORGANISMS SEEN    Culture   Final    No growth aerobically or anaerobically. Performed at [facility], [place] [address] [place], [place] [address]    Report Status 09/22/2018 FINAL  Final  Acid Fast Smear (AFB)     Status: None   Collection Time: 09/17/18 11:32 AM  Result Value Ref Range Status   AFB Specimen Processing Comment  Final    Comment: Tissue Grinding and Digestion/Decontamination   Acid Fast Smear Negative  Final    Comment: (NOTE) Performed At: [facility] [place], [place] [id number] [place] MD [id number]    Source (AFB) TISSUE  Final    Comment: LEFT KNEE Performed at [facility], [place] [address], [place], [place] [address]   Culture, fungus without smear     Status: None (Preliminary result)   Collection Time: 09/17/18 11:34 AM  Result Value Ref Range Status   Specimen Description   Final    SYNOVIAL Performed at [facility], [address], [place], [place] [address]    Special Requests   Final    NONE Performed at [facility], [address], [place], [place] [address]    Culture   Final    NO FUNGUS ISOLATED AFTER 6 DAYS Performed at [facility], [place] [address] [place], [place] [address]    Report Status PENDING  Incomplete  Acid Fast Culture with reflexed sensitivities     Status: None   Collection Time: 09/17/18 11:36 AM  Result Value Ref Range Status   Acid Fast Culture CANC  Final    Comment: (NOTE) Test cancelled at client's request.      Cancelled per Levin Erp 09/21/2018 Performed At: [facility] [place], [place] [id number] Rush Farmer MD [id number]    Source of  Sample TISSUE  Final    Comment: RIGHT KNEE Performed at [facility], [place] [address] [place], [place] [address]   Aerobic/Anaerobic Culture (surgical/deep wound)     Status: None   Collection Time: 09/17/18 11:43 AM  Result Value Ref Range Status   Specimen Description   Final    TISSUE LEFT KNEE 2 Performed at [facility], [place]., [place], [place] [address]    Special Requests PATIENT ON FOLLOWING MAXIPIME VANCOMYCIN  Final   Gram Stain   Final    FEW WBC PRESENT, PREDOMINANTLY PMN NO ORGANISMS SEEN    Culture   Final    No growth aerobically or anaerobically. Performed at [facility], [place] [address],  [place], [place] [address]    Report Status 09/22/2018 FINAL  Final  Aerobic/Anaerobic Culture (surgical/deep wound)     Status: None   Collection Time: 09/17/18 11:44 AM  Result Value Ref Range Status   Specimen Description   Final    TISSUE LEFT KNEE 3 Performed at [facility], [address] [place], [place] [address]    Special Requests PATIENT ON FOLLOWING MAXIPIME VANCOMYCIN  Final   Gram Stain   Final    MODERATE WBC PRESENT, PREDOMINANTLY PMN NO ORGANISMS SEEN    Culture   Final    No growth aerobically or anaerobically. Performed at [facility], [place] [address] [place], [place] [address]    Report Status 09/22/2018 FINAL  Final  Aerobic/Anaerobic Culture (surgical/deep wound)     Status: None   Collection Time: 09/18/18  9:43 PM  Result Value Ref Range Status   Specimen Description   Final    SYNOVIAL Performed at [facility], [address], [place], [place] [address]    Special Requests   Final    NONE Performed at [facility], [address] [place], [place] [address]    Gram Stain   Final    RARE WBC PRESENT, PREDOMINANTLY MONONUCLEAR NO ORGANISMS SEEN    Culture   Final    No growth aerobically or anaerobically. Performed at [facility], [place] [address]  [place], [place] [address]    Report Status 09/24/2018 FINAL  Final  Aerobic/Anaerobic Culture (surgical/deep wound)     Status: None   Collection Time: 09/18/18  9:44 PM  Result Value Ref Range Status   Specimen Description   Final    SYNOVIAL Performed at [facility], [address], [place], [place] [address]    Special Requests   Final    RIGHT KNEE Performed at [facility], [place], [place] [address]    Gram Stain   Final    FEW WBC PRESENT,BOTH PMN AND MONONUCLEAR NO ORGANISMS SEEN    Culture   Final    No growth aerobically or anaerobically. Performed at [facility], [place] [address] [place], [place] [address]    Report Status 09/24/2018 FINAL  Final  Aerobic/Anaerobic Culture (surgical/deep wound)     Status: None   Collection Time: 09/18/18  9:45 PM  Result Value Ref Range Status   Specimen Description   Final    SYNOVIAL Performed at [facility], [address] [place], [place] [address]    Special Requests   Final    RIGHT KNEE Performed at [facility], [address] [place],  [address]    Gram Stain   Final    FEW WBC PRESENT, PREDOMINANTLY MONONUCLEAR NO ORGANISMS SEEN    Culture   Final    No growth aerobically or anaerobically. Performed at [facility], [place] [address], [place],  [address]    Report Status 09/24/2018 FINAL  Final  Culture, blood (Routine X 2) w Reflex to ID Panel     Status: None   Collection Time: 09/19/18  1:11 PM  Result Value Ref Range Status   Specimen Description BLOOD LEFT ANTECUBITAL  Final   Special Requests   Final    BOTTLES DRAWN AEROBIC AND ANAEROBIC Blood Culture results may not be optimal due to an excessive volume of blood received in culture bottles   Culture   Final    NO  GROWTH 5 DAYS Performed at [facility], [address] [place], [place] [address]    Report Status 09/24/2018 FINAL  Final  Culture, blood (Routine X  2) w Reflex to ID Panel     Status: None   Collection Time: 09/19/18  1:20 PM  Result Value Ref Range Status   Specimen Description BLOOD BLOOD RIGHT HAND  Final   Special Requests   Final    BOTTLES DRAWN AEROBIC AND ANAEROBIC Blood Culture results may not be optimal due to an excessive volume of blood received in culture bottles   Culture   Final    NO GROWTH 5 DAYS Performed at [facility], [address], [place], [place] [address]    Report Status 09/24/2018 FINAL  Final  Urine Culture     Status: None   Collection Time: 09/20/18  3:59 PM  Result Value Ref Range Status   Specimen Description   Final    URINE, RANDOM Performed at [facility], [address] [place], [place] [address]    Special Requests   Final    NONE Performed at [facility], [address] [place], [place] [address]    Culture   Final    NO GROWTH Performed at [facility], [place] [address], [place], [place] [address]    Report Status 09/21/2018 FINAL  Final  C difficile quick scan w PCR reflex     Status: None   Collection Time: 09/21/18  3:27 PM  Result Value Ref Range Status   C Diff antigen NEGATIVE NEGATIVE Final   C Diff toxin NEGATIVE NEGATIVE Final   C Diff interpretation No C. difficile detected.  Final    Comment: Performed at [facility], [address] [place], [place] [address]  Gastrointestinal Panel by PCR , Stool     Status: None   Collection Time: 09/21/18  3:27 PM  Result Value Ref Range Status   Campylobacter species NOT DETECTED NOT DETECTED Final   Plesimonas shigelloides NOT DETECTED NOT DETECTED Final   Salmonella species NOT DETECTED NOT DETECTED Final   Yersinia enterocolitica NOT DETECTED NOT DETECTED Final   Vibrio species NOT DETECTED NOT DETECTED Final   Vibrio cholerae NOT DETECTED NOT DETECTED Final   Enteroaggregative E coli (EAEC) NOT DETECTED NOT DETECTED Final   Enteropathogenic E coli (EPEC) NOT DETECTED NOT  DETECTED Final   Enterotoxigenic E coli (ETEC) NOT DETECTED NOT DETECTED Final   Shiga like toxin producing E coli (STEC) NOT DETECTED NOT DETECTED Final   Shigella/Enteroinvasive E coli (EIEC) NOT DETECTED NOT DETECTED Final   Cryptosporidium NOT DETECTED NOT DETECTED Final   Cyclospora cayetanensis NOT DETECTED NOT DETECTED Final   Entamoeba histolytica NOT DETECTED NOT DETECTED Final   Giardia lamblia NOT DETECTED NOT DETECTED Final   Adenovirus F40/41 NOT DETECTED NOT DETECTED Final   Astrovirus NOT DETECTED NOT DETECTED Final   Norovirus GI/GII NOT DETECTED NOT DETECTED Final   Rotavirus A NOT DETECTED NOT DETECTED Final   Sapovirus (I, II, IV, and V) NOT DETECTED NOT DETECTED Final    Comment: Performed at [facility], [address] [place],  [address]    Coagulation Studies: Recent Labs    09/26/18 1508 09/27/18 0512  LABPROT 22.3* 24.2*  INR 2.0* 2.2*    Urinalysis: No results for input(s): COLORURINE, LABSPEC, PHURINE, GLUCOSEU, HGBUR, BILIRUBINUR, KETONESUR, PROTEINUR, UROBILINOGEN, NITRITE, LEUKOCYTESUR in the last 72 hours.  Invalid input(s): APPERANCEUR  Imaging: No results found.   Medications:   . sodium chloride 100 mL/hr at 09/27/18 0419  . sodium chloride    . anticoagulant sodium citrate    . argatroban 0.5 mcg/kg/min (09/27/18 1513)  . [START ON 09/28/2018] cefTAZidime (FORTAZ)  IV    . cefTAZidime (FORTAZ)  IV 2 g (09/27/18 1512)  . DAPTOmycin (CUBICIN)  IV     . aspirin EC  81 mg Oral Daily  . budesonide (PULMICORT) nebulizer solution  0.5 mg Nebulization BID  . Chlorhexidine Gluconate Cloth  6 each Topical Q0600  . feeding supplement (NEPRO CARB STEADY)  237 mL Oral BID BM  . folic acid  1 mg Oral Daily  . insulin aspart  0-5 Units Subcutaneous QHS  . insulin aspart  0-9 Units Subcutaneous TID WC  . [START ON 09/28/2018] insulin glargine  10 Units Subcutaneous Daily  . mouth rinse  15 mL Mouth Rinse BID  . multivitamin   1 tablet Oral Q1200  . sodium chloride flush  10-40 mL Intracatheter Q12H  . sodium chloride flush  3 mL Intravenous Q12H  . tuberculin  5 Units Intradermal Once  . warfarin  5 mg Oral ONCE-1800  . Warfarin - Pharmacist Dosing Inpatient   Does not apply q1800   sodium chloride, sodium chloride, acetaminophen **OR** acetaminophen, albuterol, alum & mag hydroxide-simeth, anticoagulant sodium citrate, fentaNYL (SUBLIMAZE) injection, HYDROcodone-acetaminophen, iohexol, ipratropium-albuterol, menthol-cetylpyridinium **OR** phenol, menthol-cetylpyridinium **OR** phenol, metoCLOPramide **OR** metoCLOPramide (REGLAN) injection, ondansetron **OR** ondansetron (ZOFRAN) IV, sodium chloride flush, sodium chloride flush  Assessment/ Plan:  Isaac Cross is a 58 y.o. black male with hyeprtension, COPD, diabetes mellitus type II who underwent bilateral knee replacement surgeries last week. who was admitted to [facility] on 09/12/2018 for sepsis  1. Acute renal failure with metabolic acidosis  baseline creatinine of 1.07 with normal GFR in 06/12/18.  Likely severe ATN from hypotension and sepsis -Urine output increased to slightly over 1 liter over the past 24 hours, good UOP today as well, hold off on further HD, monitor renal function trend.  2. Septic shock/hypotension  -Recent bilateral knee replacement on Sep 01, 2018 at [facility]  -  knee septic arthritis, patient underwent I&D and polyethylene exchange on 5/31 and Rt knee on 6/1 -abx management per ID/hospitalist.   3. Hypokalemia -Potassium improved to 3.4 post repletion.        LOS: 15 Dauna Ziska 6/10/20203:40 PM

## 2018-09-27 NOTE — TOC Progression Note (Signed)
Transition of Care [facility]) - Progression Note    Patient Details  Name: Isaac Cross MRN: [id number] Date of Birth: December 08, 1960  [facility] CM/SW Contact  Autym Siess, [place], [place] Phone Number: 09/27/2018, 1:52 PM  Clinical Narrative:   Clinical Social Worker (CSW) contacted patient's wife [name] and made her aware that patient is refusing CIR and SNF. Per wife she is concerned about taking care of patient at home and wants him to go to rehab. Per wife she will discuss rehab with the patient. CSW will continue to follow and assist as needed.   [employer], LCSW [phone]     Expected Discharge Plan: [place] Barriers to Discharge: Continued Medical Work up  Expected Discharge Plan and Services Expected Discharge Plan: [place] In-house Referral: Clinical Social Work Discharge Planning Services: CM Consult Post Acute Care Choice: [place] arrangements for the past 2 months: [place]: (Patient stated that he is open to home health. ) Date [place]: 09/18/18 Time [place]: [place] Representative spoke with at [place]: [name]- unsure of when patient will discharge.   Social Determinants of Health (SDOH) Interventions    Readmission Risk Interventions No flowsheet data found.

## 2018-09-27 NOTE — Consult Note (Signed)
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  Pharmacy also consulted for Warfarin dosing/management for DVT with argatroban bridge  Patient Measurements: Height: 6\' 4"  (193 cm) Weight: (una) IBW/kg (Calculated) : 86.8  Vital Signs: Temp: 98.8 F (37.1 C) (06/09 2359) Temp Source: Oral (06/09 2359) BP: 163/93 (06/09 2359) Pulse Rate: 106 (06/09 2359)  Labs: Recent Labs    09/25/18 0444 09/25/18 0540 09/26/18 0326 09/26/18 1027 09/26/18 1508 09/27/18 0512  HGB 8.2*  --  8.8*  --   --  9.7*  HCT 25.4*  --  26.9*  --   --  29.7*  PLT 150  --  227  --   --  274  APTT  --  59* 55*  --   --  71*  LABPROT  --   --   --   --  22.3* 24.2*  INR  --   --   --   --  2.0* 2.2*  CREATININE 7.57*  --   --  9.23*  --  8.38*  CKTOTAL  --   --  90  --   --   --     Estimated Creatinine Clearance: 15.1 mL/min (A) (by C-G formula based on SCr of 8.38 mg/dL (H)).   Medical History: Past Medical History:  Diagnosis Date  . Allergy   . Anxiety   . Arthritis    knees  . Asthma, mild intermittent, well-controlled   . Chronic obstructive asthma ([place])   . COPD (chronic obstructive pulmonary disease) ([place])   . Depression with anxiety   . Diabetes ([place])   . GERD (gastroesophageal reflux disease)   . HBP (high blood pressure)   . High cholesterol   . Renal cyst, right   . Sleep apnea    CPAP  . Swelling     Medications:  Scheduled:  . aspirin EC  81 mg Oral Daily  . budesonide (PULMICORT) nebulizer solution  0.5 mg Nebulization BID  . Chlorhexidine Gluconate Cloth  6 each Topical Q0600  . feeding supplement (NEPRO CARB STEADY)  237 mL Oral BID BM  . folic acid  1 mg Intravenous Daily  . insulin aspart  0-20 Units Subcutaneous Q4H  . insulin glargine  15 Units Subcutaneous Daily  . mouth rinse  15 mL Mouth Rinse BID  . multivitamin  1 tablet Oral QHS  . sodium chloride flush  10-40 mL Intracatheter Q12H  . sodium chloride  flush  3 mL Intravenous Q12H  . Warfarin - Pharmacist Dosing Inpatient   Does not apply q1800    Assessment: 58 yr male had B/l knee replacements Sep 01, 2018 was admitted with fever, AKI/ Platelet count has been declining since 6/5. He was started on heparin 6/3. 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 Level remains therapeutic.   Will continue current argatroban rate @ 0.5 mcg/kg/min, with a stop date of  4 days from today (6/14) per hematology recommendation.   Continue to monitor CBC and recheck aPTT w/ am labs   Pernell Dupre, PharmD, BCPS Clinical Pharmacist 09/27/2018 5:55 AM

## 2018-09-27 NOTE — Progress Notes (Signed)
Pharmacy Antibiotic Note  Isaac Cross is a 58 y.o. male admitted on 09/12/2018 with sepsis.  Pharmacy has been consulted for Daptomycin dosing.  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  He underwent I&D of both knees.  Cultures remain unrevealing but had received antibiotics prior to surgery.  He developed AKI and is currently requiring HD but Nephrology is hopeful that his kidney function is returned and may not need further HD (UOP improved).    Day #16 antibiotics/Day #8 current regimen doxy + zosyn  (s/p 7 days of eraxis)  Plan:  Ceftazidime 2gm IV x 1 when next dose of zosyn would be due then start 1gm IV q24h  Start Daptomycin 6mg /kg (per Adj BW for BMI >40) 700mg  IV q48h  Check CK level in 3-4 days since had recent CK elevation.  His CK 6/9 was WNL at 90  Need to monitor renal function closely as his kidneys function appears to be returning.  If does need a random HD then may require supplemental dose of abx  Height: 6\' 4"  (193 cm) Weight: (una) IBW/kg (Calculated) : 86.8  Temp (24hrs), Avg:98.6 F (37 C), Min:98.3 F (36.8 C), Max:98.8 F (37.1 C)  Recent Labs  Lab 09/23/18 0407 09/24/18 0327 09/25/18 0444 09/26/18 0326 09/26/18 1027 09/27/18 0512  WBC 9.9 8.6 9.8 10.4  --  12.1*  CREATININE 9.18* 8.30* 7.57*  --  9.23* 8.38*    Estimated Creatinine Clearance: 15.1 mL/min (A) (by C-G formula based on SCr of 8.38 mg/dL (H)).    Allergies  Allergen Reactions  . Heparin     Heparin antibody positive; SRA pending  . Lipitor [Atorvastatin] Hives and Itching  . Other   . Viagra  [Sildenafil Citrate]     vision loss     Thank you for allowing pharmacy to be a part of this patient's care.  Doreene Eland, PharmD, BCPS.   Work Cell: [phone] 09/27/2018 1:02 PM

## 2018-09-27 NOTE — Progress Notes (Signed)
[place] for patient to work with PT out of bed with temporary IJ dialysis catheter in place.

## 2018-09-27 NOTE — Progress Notes (Signed)
Subjective:  Patient reports pain as mild.    Objective:   VITALS:   Vitals:   09/26/18 1645 09/26/18 2012 09/26/18 2359 09/27/18 0805  BP: (!) 158/79  (!) 163/93 (!) 150/96  Pulse: (!) 102  (!) 106 (!) 108  Resp:   19 20  Temp: 98.6 F (37 C)  98.8 F (37.1 C) 98.3 F (36.8 C)  TempSrc: Oral  Oral Oral  SpO2: 97% 97% 96% 98%  Weight:      Height:        PHYSICAL EXAM:  Sensation intact distally Intact pulses distally Dorsiflexion/Plantar flexion intact Incision: dressing C/D/I No cellulitis present Compartment soft  LABS  Results for orders placed or performed during the hospital encounter of 09/12/18 (from the past 24 hour(s))  Protime-INR     Status: Abnormal   Collection Time: 09/26/18  3:08 PM  Result Value Ref Range   Prothrombin Time 22.3 (H) 11.4 - 15.2 seconds   INR 2.0 (H) 0.8 - 1.2  Glucose, capillary     Status: None   Collection Time: 09/26/18  4:44 PM  Result Value Ref Range   Glucose-Capillary 94 70 - 99 mg/dL  Glucose, capillary     Status: Abnormal   Collection Time: 09/26/18  7:46 PM  Result Value Ref Range   Glucose-Capillary 142 (H) 70 - 99 mg/dL  Glucose, capillary     Status: Abnormal   Collection Time: 09/26/18 11:59 PM  Result Value Ref Range   Glucose-Capillary 113 (H) 70 - 99 mg/dL  Glucose, capillary     Status: Abnormal   Collection Time: 09/27/18  3:51 AM  Result Value Ref Range   Glucose-Capillary 111 (H) 70 - 99 mg/dL  APTT     Status: Abnormal   Collection Time: 09/27/18  5:12 AM  Result Value Ref Range   aPTT 71 (H) 24 - 36 seconds  CBC     Status: Abnormal   Collection Time: 09/27/18  5:12 AM  Result Value Ref Range   WBC 12.1 (H) 4.0 - 10.5 K/uL   RBC 3.58 (L) 4.22 - 5.81 MIL/uL   Hemoglobin 9.7 (L) 13.0 - 17.0 g/dL   HCT 29.7 (L) 39.0 - 52.0 %   MCV 83.0 80.0 - 100.0 fL   MCH 27.1 26.0 - 34.0 pg   MCHC 32.7 30.0 - 36.0 g/dL   RDW 15.6 (H) 11.5 - 15.5 %   Platelets 274 150 - 400 K/uL   nRBC 0.0 0.0 - 0.2 %   Protime-INR     Status: Abnormal   Collection Time: 09/27/18  5:12 AM  Result Value Ref Range   Prothrombin Time 24.2 (H) 11.4 - 15.2 seconds   INR 2.2 (H) 0.8 - 1.2  Basic metabolic panel     Status: Abnormal   Collection Time: 09/27/18  5:12 AM  Result Value Ref Range   Sodium 138 135 - 145 mmol/L   Potassium 3.4 (L) 3.5 - 5.1 mmol/L   Chloride 101 98 - 111 mmol/L   CO2 25 22 - 32 mmol/L   Glucose, Bld 121 (H) 70 - 99 mg/dL   BUN 52 (H) 6 - 20 mg/dL   Creatinine, Ser 8.38 (H) 0.61 - 1.24 mg/dL   Calcium 7.6 (L) 8.9 - 10.3 mg/dL   GFR calc non Af Amer 6 (L) >60 mL/min   GFR calc Af Amer 7 (L) >60 mL/min   Anion gap 12 5 - 15  Glucose, capillary  Status: Abnormal   Collection Time: 09/27/18  8:04 AM  Result Value Ref Range   Glucose-Capillary 120 (H) 70 - 99 mg/dL    No results found.  Assessment/Plan: 9 Days Post-Op   Active Problems:   Sepsis ([place])   Acute renal failure ([place])   Septic shock ([place])   Up with therapy if OK with renal and hematology team Continue antibiotics per ID Discharge planning   Lovell Sheehan , MD 09/27/2018, 10:38 AM

## 2018-09-27 NOTE — Progress Notes (Signed)
Physical Therapy Treatment Patient Details Name: Isaac Cross MRN: [id number] DOB: September 25, 1960 Today's Date: 09/27/2018    History of Present Illness Pt is a 58 y.o. male presenting to hospital 09/12/18 after rolling OOB landing on L knee; pt with increased dizziness x2 days, fevers x3 days, and generalized weakness.  S/p B TKR 09/01/18 in [place] (pt reports discharging home same day).  Pt admitted with sepsis, acute renal failure, and B TKR.  Pt s/p L knee 5/31 and R knee 6/1 I&D with poly exchange.  Pt noted with severe encephalopathy from DT's with extensive ETOH abuse.  L LE DVT noted.  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  HD started 6/5 with L IJ temporary HD catheter.  PMH includes B TKR 09/01/18 in [place], DM, asthma, R vein surgery, COPD.    PT Comments    PT/OT co-treat.  Pt oriented to person and place but not fully time or situation.  Increased time noted for pt to process information/cues and initiate movement or sequence movement for activities.  Pt inconsistent with one step and multi-step cues.  Impaired insight into deficits noted.  Bed height initially elevated to stand (d/t pt being tall) but pt unable to stand with 3 assist so bed height significantly elevated with B knees blocked and then pt able to stand 2 times with walker and 3 assist (see below for details/assist levels).  Pt requiring significant cueing for upright posture in standing.  Able to side step a few feet along bed to L with walker (see assist levels below).  HR 110 bpm at rest beginning/end of session but increased up to 138 bpm with activity.  Will continue to focus on strengthening, knee ROM, and progressive functional mobility during hospital stay.   Follow Up Recommendations  CIR     Equipment Recommendations  Rolling walker with 5" wheels;3in1 (PT)    Recommendations for Other Services OT consult     Precautions / Restrictions Precautions Precautions: Fall Precaution Comments: L IJ temporary HD  catheter (Per discussion with Dr. Holley Raring and nephrology note 09/27/18 "Ok for patient to work with PT out of bed with temporary IJ dialysis catheter in place". Restrictions Weight Bearing Restrictions: Yes RLE Weight Bearing: Weight bearing as tolerated LLE Weight Bearing: Weight bearing as tolerated Other Position/Activity Restrictions: WBAT B LE's    Mobility  Bed Mobility Overal bed mobility: Needs Assistance Bed Mobility: Supine to Sit;Sit to Supine     Supine to sit: Mod assist;HOB elevated;+2 for safety/equipment(2nd assist for IV lines) Sit to supine: Max assist;+2 for physical assistance(3rd assist for IV lines)   General bed mobility comments: assist for LE's and initiating movement to sit up onto edge of bed; assist for trunk and B LE's sit to semi-supine  Transfers Overall transfer level: Needs assistance Equipment used: Rolling walker (2 wheeled) Transfers: Sit to/from Stand Sit to Stand: From elevated surface         General transfer comment: pt unable to stand 1st trial from partially elevated bed and 3 assist; 2nd trial standing pt able to stand with mod assist x2 and 3rd trial min assist x2 --all with 3rd assist for safety (bed height significantly elevated with B knees blocked); vc's and assist for UE/LE placement and overall technique  Ambulation/Gait Ambulation/Gait assistance: Min guard;Min assist;+2 physical assistance(3rd assist for lines/safety)   Assistive device: Rolling walker (2 wheeled)(Bariatric)   Gait velocity: decreased   General Gait Details: pt able to side step to L a few feet (  up towards head of bed) with vc's for technique and increased time to take steps   Stairs             Wheelchair Mobility    Modified Rankin (Stroke Patients Only)       Balance Overall balance assessment: Needs assistance Sitting-balance support: No upper extremity supported;Feet supported Sitting balance-Leahy Scale: Fair Sitting balance - Comments:  steady static sitting   Standing balance support: Bilateral upper extremity supported Standing balance-Leahy Scale: Poor Standing balance comment: required BUE support on RW; vc's and tactile cues for upright posture required both times standing (pt in forward flexed posture otherwise)                            Cognition Arousal/Alertness: Awake/alert Behavior During Therapy: Flat affect Overall Cognitive Status: Impaired/Different from baseline Area of Impairment: Following commands;Safety/judgement;Awareness;Problem solving;Orientation                 Orientation Level: Disoriented to;Time;Situation     Following Commands: Follows multi-step commands inconsistently;Follows one step commands inconsistently Safety/Judgement: Decreased awareness of safety;Decreased awareness of deficits Awareness: Anticipatory Problem Solving: Slow processing;Decreased initiation;Difficulty sequencing;Requires verbal cues        Exercises     General Comments General comments (skin integrity, edema, etc.): L UE noted to be slightly swollen and warm to touch as compared to R UE (OT notified RN)      Pertinent Vitals/Pain Pain Assessment: No/denies pain Faces Pain Scale: Hurts little more Pain Location: B knees Pain Descriptors / Indicators: Sore Pain Intervention(s): Monitored during session;Limited activity within patient's tolerance;Repositioned;Ice applied    Home Living                      Prior Function            PT Goals (current goals can now be found in the care plan section) Acute Rehab PT Goals Patient Stated Goal: to go home PT Goal Formulation: With patient Time For Goal Achievement: 10/13/18 Potential to Achieve Goals: Fair Progress towards PT goals: Progressing toward goals    Frequency    7X/week      PT Plan Current plan remains appropriate    Co-evaluation   Reason for Co-Treatment: Complexity of the patient's impairments  (multi-system involvement);For patient/therapist safety;To address functional/ADL transfers PT goals addressed during session: Mobility/safety with mobility;Balance;Proper use of DME OT goals addressed during session: ADL's and self-care;Proper use of Adaptive equipment and DME      AM-PAC PT "6 Clicks" Mobility   Outcome Measure  Help needed turning from your back to your side while in a flat bed without using bedrails?: A Lot Help needed moving from lying on your back to sitting on the side of a flat bed without using bedrails?: A Lot Help needed moving to and from a bed to a chair (including a wheelchair)?: Total Help needed standing up from a chair using your arms (e.g., wheelchair or bedside chair)?: Total Help needed to walk in hospital room?: Total Help needed climbing 3-5 steps with a railing? : Total 6 Click Score: 8    End of Session Equipment Utilized During Treatment: Gait belt Activity Tolerance: Patient tolerated treatment well Patient left: in bed;with call bell/phone within reach;with bed alarm set;with SCD's reapplied;Other (comment)(B heels elevated via towel rolls; polar care in place and activated to L knee) Nurse Communication: Mobility status;Precautions;Weight bearing status PT Visit Diagnosis: Other abnormalities of  gait and mobility (R26.89);Muscle weakness (generalized) (M62.81);Difficulty in walking, not elsewhere classified (R26.2);Pain Pain - Right/Left: Left Pain - part of body: Knee     Time: 0350-0938 PT Time Calculation (min) (ACUTE ONLY): 47 min  Charges:  $Therapeutic Activity: 23-37 mins                    Leitha Bleak, PT 09/27/18, 5:42 PM [phone]

## 2018-09-27 NOTE — Progress Notes (Addendum)
[place] at [place] NAME: Isaac Cross    MR#:  [id number]  DATE OF BIRTH:  Nov 28, 1960  SUBJECTIVE:   - more alert and conversive. Wants to go home -T-max is improved    -Renal function worsening, decreased urine output-now started on HD  REVIEW OF SYSTEMS:  Review of Systems  Constitutional: Negative for chills, fever and weight loss.  HENT: Negative for ear discharge, ear pain and nosebleeds.   Eyes: Negative for blurred vision, pain and discharge.  Respiratory: Negative for sputum production, shortness of breath, wheezing and stridor.   Cardiovascular: Negative for chest pain, palpitations, orthopnea and PND.  Gastrointestinal: Negative for abdominal pain, diarrhea, nausea and vomiting.  Genitourinary: Negative for frequency and urgency.  Musculoskeletal: Negative for back pain and joint pain.  Neurological: Positive for weakness. Negative for sensory change, speech change and focal weakness.  Psychiatric/Behavioral: Negative for depression and hallucinations. The patient is not nervous/anxious.    DRUG ALLERGIES:   Allergies  Allergen Reactions  . Heparin     Heparin antibody positive; SRA pending  . Lipitor [Atorvastatin] Hives and Itching  . Other   . Viagra  [Sildenafil Citrate]     vision loss    VITALS:  Blood pressure (!) 150/96, pulse (!) 108, temperature 98.3 F (36.8 C), temperature source Oral, resp. rate 20, height 6\' 4"  (1.93 m), weight (!) 153.7 kg, SpO2 98 %.  PHYSICAL EXAMINATION:  Physical Exam   GENERAL:  58 y.o.-year-old patient lying in the bed with no acute distress. Critically ill appearing EYES: Pupils equal, round, reactive to light and accommodation. No scleral icterus.  HEENT: Head atraumatic, normocephalic. Oropharynx and nasopharynx clear.  NECK:  Supple, no jugular venous distention. No thyroid enlargement, no tenderness.  LUNGS: Normal breath sounds bilaterally, no wheezing, rales,rhonchi  or crepitation. No use of accessory muscles of respiration.  Decreased bibasilar breath sounds CARDIOVASCULAR: S1, S2 normal. No murmurs, rubs, or gallops. left IJ dialysis catheter  ABDOMEN: Soft, nontender, nondistended. Bowel sounds present. No organomegaly or mass. Foley+ EXTREMITIES: Swelling of both legs, knees noted with recent surgery.  No  cyanosis, or clubbing.  Both hands are swollen as well. NEUROLOGIC: Cranial nerves II through XII are intact. Moving all extremities well PSYCHIATRIC: patient is alert and oriented x2 SKIN: No obvious rash, lesion, or ulcer.    LABORATORY PANEL:   CBC Recent Labs  Lab 09/27/18 0512  WBC 12.1*  HGB 9.7*  HCT 29.7*  PLT 274   ------------------------------------------------------------------------------------------------------------------  Chemistries  Recent Labs  Lab 09/25/18 0444  09/27/18 0512  NA 142   < > 138  K 3.4*   < > 3.4*  CL 103   < > 101  CO2 24   < > 25  GLUCOSE 234*   < > 121*  BUN 52*   < > 52*  CREATININE 7.57*   < > 8.38*  CALCIUM 7.7*   < > 7.6*  MG 1.9  --   --   AST 56*  --   --   ALT 37  --   --   ALKPHOS 64  --   --   BILITOT 0.7  --   --    < > = values in this interval not displayed.   ------------------------------------------------------------------------------------------------------------------  Cardiac Enzymes No results for input(s): TROPONINI in the last 168 hours. ------------------------------------------------------------------------------------------------------------------  RADIOLOGY:  No results found.  EKG:   Orders placed or performed during the hospital  encounter of 09/12/18  . EKG 12-Lead  . EKG 12-Lead    ASSESSMENT AND PLAN:    EdwardGradyis a58 y.o.malewith a known history of arthritis, COPD not on home oxygen, hypertension, sleep apnea, non-insulin-dependent diabetes mellitus presents to hospital secondary to dizziness, weakness and a fall  1.Sepsis -s/p  Recent bilateral knee replacement surgeries as outpatient and now bilateral knee washouts done this adm. - fluid Cultures are negative so far. - Fungal cultures negative -Appreciate ID consult.   -Patient on zosyn, doxycycline  -completed 7 days of anidulafungin (last dose today)  -Received IV albumin as well--now d/ced -now off IV stress dose steroids  2. Acute Oliguric renal failure-likely ATN from sepsis and hypotension - appreciate nephrology input. -Hemodialysis started 09/22/2018 -Hold lisinopril and metformin and other nephrotoxins.    -pt has started making urine!!  3. Acute encephalopathy-secondary to metabolic causes, sepsis and uremia.   -  No focal deficits -improving mentation  4. 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 Diabetes mellitus-2 - start lantus and cont ssi -Hold metformin given worsening renal failure.  6. DVT prophylaxis- argatroban drip and coumadin  Physical therapy evaluation noted  Spoke with wife [name] yday  CODE STATUS: Full code  TOTAL TIME TAKING CARE OF THIS PATIENT: 28 minutes.   POSSIBLE D/C IN ?? DAYS, DEPENDING ON CLINICAL CONDITION.   Fritzi Mandes M.D on 09/27/2018 at 11:39 AM  Between 7am to 6pm - Pager - [phone] After 6pm go to [email or web address] - password [email or web address] [place] [facility]  Office  [phone]  CC: Primary care physician; Steele Sizer, MD

## 2018-09-27 NOTE — TOC Progression Note (Signed)
Transition of Care [facility]) - Progression Note    Patient Details  Name: Isaac Cross MRN: [id number] Date of Birth: Feb 21, 1961  Transition of Care [facility]) CM/SW [place], RN Phone Number: 09/27/2018, 10:28 AM  Clinical Narrative:    Levada Schilling with [facility] about the probable need for IV ABX.    Expected Discharge Plan: [place] Barriers to Discharge: Continued Medical Work up  Expected Discharge Plan and Services Expected Discharge Plan: [place] In-house Referral: Clinical Social Work Discharge Planning Services: CM Consult Post Acute Care Choice: [place] arrangements for the past 2 months: [place]: (Patient stated that he is open to home health. ) Date [place]: 09/18/18 Time [place]: [place] Representative spoke with at [place]: [name]- unsure of when patient will discharge.   Social Determinants of Health (SDOH) Interventions    Readmission Risk Interventions No flowsheet data found.

## 2018-09-27 NOTE — Progress Notes (Signed)
Occupational Therapy Treatment Patient Details Name: Isaac Cross MRN: [id number] DOB: 08-13-1960 Today's Date: 09/27/2018    History of present illness Pt is a 58 y.o. male presenting to hospital 09/12/18 after rolling OOB landing on L knee; pt with increased dizziness x2 days, fevers x3 days, and generalized weakness.  S/p B TKR 09/01/18 in [place] (pt reports discharging home same day).  Pt admitted with sepsis, acute renal failure, and B TKR.  Pt s/p L knee 5/31 and R knee 6/1 I&D with poly exchange.  Pt noted with severe encephalopathy from DT's with extensive ETOH abuse.  L LE DVT noted.  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  HD started 6/5 with L IJ temporary HD catheter.  PMH includes B TKR 09/01/18 in [place], DM, asthma, R vein surgery, COPD.   OT comments  Pt seen for OT co-tx with PT this date. Pt eager to work with therapists. Pt noted to require increased cuing for sequencing and safety this date for all aspects of functional mobility training for ADL. Pt required increased assist from previous session and pt demonstrated impaired cognition, requiring additional time and cues for problem solving and safety. Pt able to successfully stand with +2 physical assist and 3rd person for safety/equipment on 2/3 stand attempts. HR up to 138 with effort, recovered quickly.  RN notified of pt's LUE which was noted to be slightly swollen and warm to touch as compared to RUE. Pt denied pain. Pt continues to benefit from skilled OT services. Recommendation updated to CIR, as pt would benefit from high-intensity, skilled therapy to maximize return to PLOF, minimize risk of falls, and minimize caregiver burden prior to return home with spouse.   Follow Up Recommendations  CIR    Equipment Recommendations  3 in 1 bedside commode    Recommendations for Other Services Rehab consult    Precautions / Restrictions Precautions Precautions: Fall Precaution Comments: L IJ temporary HD  catheter Restrictions Weight Bearing Restrictions: Yes RLE Weight Bearing: Weight bearing as tolerated LLE Weight Bearing: Weight bearing as tolerated       Mobility Bed Mobility Overal bed mobility: Needs Assistance Bed Mobility: Supine to Sit;Sit to Supine     Supine to sit: Mod assist;HOB elevated Sit to supine: +2 for physical assistance;Max assist   General bed mobility comments: see PT treatment note for additional detail  Transfers Overall transfer level: Needs assistance Equipment used: Rolling walker (2 wheeled) Transfers: Sit to/from Stand Sit to Stand: Mod assist;+2 physical assistance         General transfer comment: see PT treatment note for additional detail; 3rd person for safety/equipment    Balance Overall balance assessment: Needs assistance Sitting-balance support: No upper extremity supported;Feet supported Sitting balance-Leahy Scale: Fair     Standing balance support: Bilateral upper extremity supported Standing balance-Leahy Scale: Poor Standing balance comment: required BUE support on RW                           ADL either performed or assessed with clinical judgement   ADL Overall ADL's : Needs assistance/impaired                                       General ADL Comments: Max Assist for LB ADL tasks     Vision Baseline Vision/History: Wears glasses Wears Glasses: At all times Patient Visual Report:  No change from baseline     Perception     Praxis      Cognition Arousal/Alertness: Awake/alert Behavior During Therapy: Flat affect Overall Cognitive Status: Impaired/Different from baseline Area of Impairment: Following commands;Safety/judgement;Awareness;Problem solving;Orientation                 Orientation Level: Disoriented to;Time;Situation;Place     Following Commands: Follows multi-step commands inconsistently;Follows one step commands inconsistently Safety/Judgement: Decreased  awareness of safety;Decreased awareness of deficits Awareness: Anticipatory Problem Solving: Slow processing;Decreased initiation;Difficulty sequencing;Requires verbal cues          Exercises Other Exercises Other Exercises: functional transfer training   Shoulder Instructions       General Comments LUE noted to be slightly swollen and warm to touch as compared to RUE. Notified RN.    Pertinent Vitals/ Pain       Pain Assessment: No/denies pain Pain Intervention(s): Monitored during session;Limited activity within patient's tolerance;Repositioned;Ice applied  Home Living                                          Prior Functioning/Environment              Frequency  Min 3X/week        Progress Toward Goals  OT Goals(current goals can now be found in the care plan section)  Progress towards OT goals: OT to reassess next treatment  Acute Rehab OT Goals Patient Stated Goal: to go home OT Goal Formulation: With patient Time For Goal Achievement: 10/09/18 Potential to Achieve Goals: Good  Plan Discharge plan needs to be updated;Frequency needs to be updated    Co-evaluation    PT/OT/SLP Co-Evaluation/Treatment: Yes Reason for Co-Treatment: Complexity of the patient's impairments (multi-system involvement);For patient/therapist safety;To address functional/ADL transfers PT goals addressed during session: Mobility/safety with mobility;Balance;Proper use of DME OT goals addressed during session: ADL's and self-care;Proper use of Adaptive equipment and DME      AM-PAC OT "6 Clicks" Daily Activity     Outcome Measure   Help from another person eating meals?: None Help from another person taking care of personal grooming?: None Help from another person toileting, which includes using toliet, bedpan, or urinal?: Total Help from another person bathing (including washing, rinsing, drying)?: Total Help from another person to put on and taking off  regular upper body clothing?: A Little Help from another person to put on and taking off regular lower body clothing?: Total 6 Click Score: 14    End of Session Equipment Utilized During Treatment: Gait belt;Rolling walker  OT Visit Diagnosis: Other abnormalities of gait and mobility (R26.89);History of falling (Z91.81);Pain Pain - Right/Left: Left(both) Pain - part of body: Knee   Activity Tolerance Patient tolerated treatment well   Patient Left in bed;with call bell/phone within reach;with bed alarm set;with SCD's reapplied;Other (comment)(polar care on L knee)   Nurse Communication          Time: 7062-3762 OT Time Calculation (min): 38 min  Charges: OT General Charges $OT Visit: 1 Visit OT Treatments $Therapeutic Activity: 8-22 mins  Jeni Salles, MPH, MS, OTR/L ascom [phone] 09/27/18, 4:59 PM

## 2018-09-27 NOTE — Progress Notes (Signed)
Date of Admission:  09/12/2018    Feeling better No fever off diaysis today as his urine output > 1000   Medications:  . aspirin EC  81 mg Oral Daily  . budesonide (PULMICORT) nebulizer solution  0.5 mg Nebulization BID  . Chlorhexidine Gluconate Cloth  6 each Topical Q0600  . feeding supplement (NEPRO CARB STEADY)  237 mL Oral BID BM  . folic acid  1 mg Oral Daily  . insulin aspart  0-5 Units Subcutaneous QHS  . insulin aspart  0-9 Units Subcutaneous TID WC  . [START ON 09/28/2018] insulin glargine  10 Units Subcutaneous Daily  . mouth rinse  15 mL Mouth Rinse BID  . multivitamin  1 tablet Oral Q1200  . sodium chloride flush  10-40 mL Intracatheter Q12H  . sodium chloride flush  3 mL Intravenous Q12H  . tuberculin  5 Units Intradermal Once  . warfarin  5 mg Oral ONCE-1800  . Warfarin - Pharmacist Dosing Inpatient   Does not apply q1800    Objective: Vital signs in last 24 hours: Temp:  [98.3 F (36.8 C)-98.8 F (37.1 C)] 98.3 F (36.8 C) (06/10 0805) Pulse Rate:  [102-108] 108 (06/10 0805) Resp:  [19-20] 20 (06/10 0805) BP: (141-163)/(79-96) 150/96 (06/10 0805) SpO2:  [96 %-98 %] 98 % (06/10 0805)  PHYSICAL EXAM:  General: Alert, cooperative, no distress, appears stated age.  Head: Normocephalic, without obvious abnormality, atraumatic. Eyes: Conjunctivae clear, anicteric sclerae. Pupils are equal ENT Nares normal. No drainage or sinus tenderness. Lips, mucosa, and tongue normal. No Thrush Neck: Supple, symmetrical, no adenopathy, thyroid: non tender no carotid bruit and no JVD. Back: No CVA tenderness. Lungs: b/l air entry Heart: s1s2 Abdomen: Soft, non-tender,not distended. Foley in place   Extremities: b/l leg edema B/l surgial knee Left IJ   Skin: No rashes or lesions. Or bruising Lymph: Cervical, supraclavicular normal. Neurologic: Grossly non-focal  Lab Results Recent Labs    09/26/18 0326 09/26/18 1027 09/27/18 0512  WBC 10.4  --  12.1*  HGB  8.8*  --  9.7*  HCT 26.9*  --  29.7*  NA  --  140 138  K  --  2.9* 3.4*  CL  --  102 101  CO2  --  25 25  BUN  --  66* 52*  CREATININE  --  9.23* 8.38*   Liver Panel Recent Labs    09/25/18 0444 09/26/18 1027  PROT 6.0*  --   ALBUMIN 2.0* 1.8*  AST 56*  --   ALT 37  --   ALKPHOS 64  --   BILITOT 0.7  --      Microbiology: [facility] 09/12/18-NG 09/19/18 BC NG 6/3 fungal culture neg so far Synovial fluid 09/15/18 Neg Synovial tissue culture 5/30 NG  Assessment/Plan: 58 yr male had B/l knee replacements Sep 01, 2018 was admitted with fever, AKI . He was taking bactrim and keflex when he came to the Ed on 09/12/18  Initially feverswere persisting inspite of IV vanco and cefepime and as there was no other source (Blood culture neg, urine culture neg, no pneumonia, ) the left knee was aspirated and it had 8000 wbc and he was taken for a wash out on 09/17/18. Rt knee was washed out on 09/18/18 All cultures neg Pts fever was down for 36-48 hrs and started up again on 09/19/18 at 103. Central line, femoral line were removed Cefepime was changed to zosyn on 6/2  Also his creatinine which on admission  was 3.71, then 4.3 was down to 2.46 on 5/31 and then started climbing up again and today is 5.98. Vanco was discontinued on 09/19/18 ( when it was 3.42)  Fever- no fever in 4 days PJI of the left knee -and he had wash out and cultures neg No UTI, no bacteremia, no pneumonia SuspectedCandidemia--added anidulafungin6/3and doxy 6/3- - he also was started on stress dose steroids on the same day and was also diagnosed with left popliteal DVT on 09/20/18 and started on heparin- So not sure which impacted the fever. Asfungal blood culture neg  DC anidulafungin day 7  All cultures neg, even 16S RNA NGS negative- will need 4 weeks of IV ceftazidime  + daptomycin(6mg /kg dose) on discharge until 10/31/18  Adjusted to crcl    Encephalopathy resolved ? DT? Drug reaction like NMS/serotonin syndrome,  compounded by worsening renal function resolved  CPK has normalized  septic shock Hypotension -resolved  AKI- started dialysison 09/22/18- now on hold  ThrombocytopeniaPlatelet countwas Declining since 09/22/18-HIT positive, changed to  argatroban and platelet improving.  Anemia  Discussed the management with the patient and care team

## 2018-09-27 NOTE — Progress Notes (Signed)
Nutrition Follow-up  RD working remotely.  DOCUMENTATION CODES:   Obesity unspecified  INTERVENTION:  Continue Nepro Shake po BID, each supplement provides 425 kcal and 19 grams protein. Encourage patient to drink to help meet calorie/protein needs.  Continue Rena-vite daily. If patient continues to not need HD this can be switched to a regular MVI daily.  NUTRITION DIAGNOSIS:   Inadequate oral intake related to acute illness as evidenced by other (comment)(per chart review ).  Resolving.  GOAL:   Patient will meet greater than or equal to 90% of their needs  Progressing.  MONITOR:   PO intake, Supplement acceptance, Labs, Weight trends, I & O's, Skin  REASON FOR ASSESSMENT:   LOS    ASSESSMENT:   58 y.o. black male with hyeprtension, COPD, diabetes mellitus type II who underwent bilateral knee replacement surgeries last week. who was admitted to [facility] on 09/12/2018 for sepsis and ARF  Attempted to call patient for follow-up nutrition assessment but he was unable to answer the phone. Per chart PO intake is improving. He ate 50% of dinner last night and 70% of breakfast this morning. Per chart patient was drinking 1-2 bottles of Nepro per day but has refused them yesterday and today. Per Nephrology note today holding off on HD today and monitoring renal function.  Medications reviewed and include: folic acid 1 mg daily, Novolog 0-9 units TID, Novolog 0-5 units QHS, Lantus 10 units daily, Rena-vite daily, warfarin.  Labs reviewed: CBG 111-132, Potassium 3.4, BUN 52, Creatinine 8.38.  Diet Order:   Diet Order            Diet Carb Modified Fluid consistency: Thin; Room service appropriate? Yes  Diet effective now             EDUCATION NEEDS:   Not appropriate for education at this time  Skin:  Skin Assessment: Reviewed RN Assessment(Incisions L and R knees)  Last BM:  6/5- TYPE 7  Height:   Ht Readings from Last 1 Encounters:  09/12/18 6\' 4"  (1.93 m)    Weight:   Wt Readings from Last 1 Encounters:  09/24/18 (!) 153.7 kg   Ideal Body Weight:  91.8 kg  BMI:  Body mass index is 41.25 kg/m.  Estimated Nutritional Needs:   Kcal:  2700-3000kcal/day   Protein:  >150g/day   Fluid:  >2.7L/day   Willey Blade, MS, RD, LDN Office: [phone] Pager: [phone] After Hours/Weekend Pager: [phone]

## 2018-09-27 NOTE — Consult Note (Signed)
[place] for argatroban Indication: HIT  Pharmacy also consulted for Warfarin dosing/management for DVT with argatroban bridge  Patient Measurements: Height: 6\' 4"  (193 cm) Weight: (una) IBW/kg (Calculated) : 86.8  Vital Signs: Temp: 98.8 F (37.1 C) (06/09 2359) Temp Source: Oral (06/09 2359) BP: 163/93 (06/09 2359) Pulse Rate: 106 (06/09 2359)  Labs: Recent Labs    09/25/18 0444 09/25/18 0540 09/26/18 0326 09/26/18 1027 09/26/18 1508 09/27/18 0512  HGB 8.2*  --  8.8*  --   --  9.7*  HCT 25.4*  --  26.9*  --   --  29.7*  PLT 150  --  227  --   --  274  APTT  --  59* 55*  --   --  71*  LABPROT  --   --   --   --  22.3* 24.2*  INR  --   --   --   --  2.0* 2.2*  CREATININE 7.57*  --   --  9.23*  --  8.38*  CKTOTAL  --   --  90  --   --   --     Estimated Creatinine Clearance: 15.1 mL/min (A) (by C-G formula based on SCr of 8.38 mg/dL (H)).   Medical History: Past Medical History:  Diagnosis Date  . Allergy   . Anxiety   . Arthritis    knees  . Asthma, mild intermittent, well-controlled   . Chronic obstructive asthma ([place])   . COPD (chronic obstructive pulmonary disease) ([place])   . Depression with anxiety   . Diabetes ([place])   . GERD (gastroesophageal reflux disease)   . HBP (high blood pressure)   . High cholesterol   . Renal cyst, right   . Sleep apnea    CPAP  . Swelling     Medications:  Scheduled:  . aspirin EC  81 mg Oral Daily  . budesonide (PULMICORT) nebulizer solution  0.5 mg Nebulization BID  . Chlorhexidine Gluconate Cloth  6 each Topical Q0600  . feeding supplement (NEPRO CARB STEADY)  237 mL Oral BID BM  . folic acid  1 mg Intravenous Daily  . insulin aspart  0-20 Units Subcutaneous Q4H  . insulin glargine  15 Units Subcutaneous Daily  . mouth rinse  15 mL Mouth Rinse BID  . multivitamin  1 tablet Oral QHS  . sodium chloride flush  10-40 mL Intracatheter Q12H  . sodium chloride flush  3 mL  Intravenous Q12H  . Warfarin - Pharmacist Dosing Inpatient   Does not apply q1800    Assessment: 58 yr male had B/l knee replacements Sep 01, 2018 was admitted with fever, AKI/ Platelet count has been declining since 6/5. He was started on heparin 6/3. Child Pugh score estimated to be 5-6  Baseline aPTT: 35s 06/6   @1525 :aPTT 91s  plt 102 06/6   @1823 :aPTT 64s       06/6   @2013 :aPTT 63s   06/07 @0330  aPTT 60s  plt   99 06/08 @0540  aPTT 59s  plt 150 06/09 @0326  aPTT 55s  plt 227   INR 2.0  Warfarin 5mg  06/10 @0512  aPTT 71s  plt 274   INR 2.2  Warfarin 5mg   HIT lab resulted 6/8 1740 - Abnormal 1.854, ordered SRA  Goal of Therapy:  aPTT 50-90 seconds Monitor platelets by anticoagulation protocol: Yes   Plan:   0610 @ 0512  aPTT 71. Level remains therapeutic.   Will continue current argatroban  rate @ 0.5 mcg/kg/min, with a stop date of 4 days from today (6/14) per hematology recommendation.   Will dose Warfarin 5mg  per hematology  Will continue to monitor CBC and recheck aPTT/INR w/ am labs   Lu Duffel, PharmD, BCPS Clinical Pharmacist 09/27/2018 7:37 AM

## 2018-09-28 LAB — GLUCOSE, CAPILLARY
Glucose-Capillary: 76 mg/dL (ref 70–99)
Glucose-Capillary: 118 mg/dL — ABNORMAL HIGH (ref 70–99)
Glucose-Capillary: 141 mg/dL — ABNORMAL HIGH (ref 70–99)
Glucose-Capillary: 109 mg/dL — ABNORMAL HIGH (ref 70–99)
Glucose-Capillary: 100 mg/dL — ABNORMAL HIGH (ref 70–99)
Glucose-Capillary: 96 mg/dL (ref 70–99)

## 2018-09-28 LAB — PROTEIN / CREATININE RATIO, URINE
Creatinine, Urine: 48 mg/dL
Protein Creatinine Ratio: 0.96 mg/mg{Cre} — ABNORMAL HIGH (ref 0.00–0.15)
Total Protein, Urine: 46 mg/dL

## 2018-09-28 LAB — PROTIME-INR
INR: 2.6 — ABNORMAL HIGH (ref 0.8–1.2)
Prothrombin Time: 27.5 seconds — ABNORMAL HIGH (ref 11.4–15.2)

## 2018-09-28 LAB — CBC
HCT: 27.3 % — ABNORMAL LOW (ref 39.0–52.0)
Hemoglobin: 8.9 g/dL — ABNORMAL LOW (ref 13.0–17.0)
MCH: 26.7 pg (ref 26.0–34.0)
MCHC: 32.6 g/dL (ref 30.0–36.0)
MCV: 82 fL (ref 80.0–100.0)
Platelets: 373 10*3/uL (ref 150–400)
RBC: 3.33 MIL/uL — ABNORMAL LOW (ref 4.22–5.81)
RDW: 15.5 % (ref 11.5–15.5)
WBC: 10.4 10*3/uL (ref 4.0–10.5)
nRBC: 0 % (ref 0.0–0.2)

## 2018-09-28 LAB — CREATININE, SERUM
Creatinine, Ser: 10.19 mg/dL — ABNORMAL HIGH (ref 0.61–1.24)
GFR calc Af Amer: 6 mL/min — ABNORMAL LOW (ref >60)
GFR calc non Af Amer: 5 mL/min — ABNORMAL LOW (ref >60)

## 2018-09-28 LAB — SEDIMENTATION RATE: Sed Rate: 69 mm/hr — ABNORMAL HIGH (ref 0–20)

## 2018-09-28 LAB — C-REACTIVE PROTEIN: CRP: 19.1 mg/dL — ABNORMAL HIGH (ref <1.0)

## 2018-09-28 LAB — APTT
aPTT: 88 seconds — ABNORMAL HIGH (ref 24–36)
aPTT: 80 s — ABNORMAL HIGH (ref 24–36)

## 2018-09-28 MED ORDER — CITALOPRAM HYDROBROMIDE 20 MG PO TABS
20.0000 mg | ORAL_TABLET | Freq: Every day | ORAL | Status: DC
  Administered 2018-09-28 – 2018-10-04 (×7): 20 mg via ORAL
  Filled 2018-09-28 (×7): qty 1

## 2018-09-28 MED ORDER — WARFARIN SODIUM 2 MG PO TABS: 2.0000 mg | ORAL_TABLET | Freq: Once | ORAL | Status: DC

## 2018-09-28 MED ORDER — DEXTROSE 5 % IV SOLN
0.5000 g | INTRAVENOUS | Status: DC
  Administered 2018-09-28: 0.5 g via INTRAVENOUS
  Filled 2018-09-28 (×2): qty 0.5

## 2018-09-28 MED ORDER — ATENOLOL 25 MG PO TABS
25.0000 mg | ORAL_TABLET | Freq: Every day | ORAL | Status: DC
  Administered 2018-09-28 – 2018-10-04 (×7): 25 mg via ORAL
  Filled 2018-09-28 (×7): qty 1

## 2018-09-28 MED ORDER — FLUTICASONE FUROATE-VILANTEROL 100-25 MCG/INH IN AEPB
1.0000 | INHALATION_SPRAY | Freq: Every day | RESPIRATORY_TRACT | Status: DC
  Administered 2018-09-28 – 2018-10-12 (×15): 1 via RESPIRATORY_TRACT
  Filled 2018-09-28: qty 28

## 2018-09-28 MED ORDER — ADULT MULTIVITAMIN W/MINERALS CH
1.0000 | ORAL_TABLET | Freq: Every day | ORAL | Status: DC
  Administered 2018-09-28 – 2018-10-12 (×15): 1 via ORAL
  Filled 2018-09-28 (×15): qty 1

## 2018-09-28 MED ORDER — OMEGA-3-ACID ETHYL ESTERS 1 G PO CAPS
1.0000 g | ORAL_CAPSULE | Freq: Every day | ORAL | Status: DC
  Administered 2018-09-28 – 2018-10-12 (×14): 1 g via ORAL
  Filled 2018-09-28 (×15): qty 1

## 2018-09-28 MED ORDER — LABETALOL HCL 5 MG/ML IV SOLN
10.0000 mg | Freq: Once | INTRAVENOUS | Status: AC
  Administered 2018-09-28: 10 mg via INTRAVENOUS
  Filled 2018-09-28: qty 4

## 2018-09-28 MED ORDER — ROSUVASTATIN CALCIUM 20 MG PO TABS
20.0000 mg | ORAL_TABLET | Freq: Every day | ORAL | Status: DC
  Administered 2018-09-28 – 2018-10-02 (×5): 20 mg via ORAL
  Filled 2018-09-28 (×3): qty 1
  Filled 2018-09-28: qty 2
  Filled 2018-09-28: qty 1
  Filled 2018-09-28 (×2): qty 2
  Filled 2018-09-28: qty 1
  Filled 2018-09-28 (×2): qty 2

## 2018-09-28 MED ORDER — WARFARIN SODIUM 5 MG PO TABS
5.0000 mg | ORAL_TABLET | Freq: Once | ORAL | Status: AC
  Administered 2018-09-28: 5 mg via ORAL
  Filled 2018-09-28: qty 1

## 2018-09-28 NOTE — Progress Notes (Signed)
Pharmacy Antibiotic Note  Isaac Cross is a 58 y.o. male admitted on 09/12/2018 with sepsis.  Pharmacy has been consulted for Daptomycin dosing.  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  He underwent I&D of both knees.  Cultures remain unrevealing but had received antibiotics prior to surgery.  He developed AKI and is currently requiring HD but Nephrology is hopeful that his kidney function is returned and may not need further HD (UOP improved).    Day #17 antibiotics/Day #2 daptomyicn/ceftazidime  (s/p 7 days of eraxis) - renal fx worse this am - likely will need HD as outpatient   Plan:  No HD planned today, reduce Ceftazidime to 500mg  q24h  Start Daptomycin 6mg /kg (per Adj BW for BMI >40) 700mg  IV q48h  Check CK level in 3-4 days since had recent CK elevation.  His CK 6/9 was WNL at 90  Need to monitor renal function closely as his kidneys function appears to be returning.  If does need a random HD then may require supplemental dose of abx  Await HD plan per nephrology  Height: 6\' 4"  (193 cm) Weight: (una) IBW/kg (Calculated) : 86.8  Temp (24hrs), Avg:98.7 F (37.1 C), Min:98.2 F (36.8 C), Max:99.8 F (37.7 C)  Recent Labs  Lab 09/24/18 0327 09/25/18 0444 09/26/18 0326 09/26/18 1027 09/27/18 0512 09/28/18 0253 09/28/18 0817  WBC 8.6 9.8 10.4  --  12.1* 10.4  --   CREATININE 8.30* 7.57*  --  9.23* 8.38*  --  10.19*    Estimated Creatinine Clearance: 12.4 mL/min (A) (by C-G formula based on SCr of 10.19 mg/dL (H)).    Allergies  Allergen Reactions  . Heparin     Heparin antibody positive; SRA pending  . Lipitor [Atorvastatin] Hives and Itching  . Other   . Viagra  [Sildenafil Citrate]     vision loss     Thank you for allowing pharmacy to be a part of this patient's care.  Doreene Eland, PharmD, BCPS.   Work Cell: [phone] 09/28/2018 3:34 PM

## 2018-09-28 NOTE — Consult Note (Signed)
[place] for argatroban Indication: HIT  Pharmacy also consulted for Warfarin dosing/management for DVT with argatroban bridge  Patient Measurements: Height: 6\' 4"  (193 cm) Weight: (una) IBW/kg (Calculated) : 86.8  Vital Signs: Temp: 98.3 F (36.8 C) (06/11 0808) BP: 153/84 (06/11 0808) Pulse Rate: 102 (06/11 0808)  Labs: Recent Labs    09/26/18 0326 09/26/18 1027 09/26/18 1508 09/27/18 0512 09/28/18 0253 09/28/18 0817  HGB 8.8*  --   --  9.7* 8.9*  --   HCT 26.9*  --   --  29.7* 27.3*  --   PLT 227  --   --  274 373  --   APTT 55*  --   --  71* 88* 80*  LABPROT  --   --  22.3* 24.2* 27.5*  --   INR  --   --  2.0* 2.2* 2.6*  --   CREATININE  --  9.23*  --  8.38*  --  10.19*  CKTOTAL 90  --   --   --   --   --     Estimated Creatinine Clearance: 12.4 mL/min (A) (by C-G formula based on SCr of 10.19 mg/dL (H)).   Medical History: Past Medical History:  Diagnosis Date  . Allergy   . Anxiety   . Arthritis    knees  . Asthma, mild intermittent, well-controlled   . Chronic obstructive asthma ([place])   . COPD (chronic obstructive pulmonary disease) ([place])   . Depression with anxiety   . Diabetes ([place])   . GERD (gastroesophageal reflux disease)   . HBP (high blood pressure)   . High cholesterol   . Renal cyst, right   . Sleep apnea    CPAP  . Swelling     Medications:  Scheduled:  . aspirin EC  81 mg Oral Daily  . atenolol  25 mg Oral Daily  . budesonide (PULMICORT) nebulizer solution  0.5 mg Nebulization BID  . Chlorhexidine Gluconate Cloth  6 each Topical Q0600  . citalopram  20 mg Oral Daily  . feeding supplement (NEPRO CARB STEADY)  237 mL Oral BID BM  . fluticasone furoate-vilanterol  1 puff Inhalation Daily  . folic acid  1 mg Oral Daily  . insulin aspart  0-5 Units Subcutaneous QHS  . insulin aspart  0-9 Units Subcutaneous TID WC  . insulin glargine  10 Units Subcutaneous Daily  . mouth rinse  15 mL Mouth  Rinse BID  . multivitamin  1 tablet Oral Q1200  . multivitamin with minerals  1 tablet Oral Daily  . omega-3 acid ethyl esters  1 g Oral Daily  . rosuvastatin  20 mg Oral Daily  . sodium chloride flush  10-40 mL Intracatheter Q12H  . sodium chloride flush  3 mL Intravenous Q12H  . tuberculin  5 Units Intradermal Once  . warfarin  5 mg Oral ONCE-1800  . Warfarin - Pharmacist Dosing Inpatient   Does not apply q1800    Assessment: 58 yr male had B/l knee replacements Sep 01, 2018 was admitted with fever, AKI/ Platelet count has been declining since 6/5. He was started on heparin 6/3. 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 Level remains therapeutic but continues to trend upward. Will reduce infusion rate to 0.4 mcg/kg/min. Recheck aPTT 2 hours after infusion rate change - aPTT level returned 80 - continue current rate.   Stop date for argatroban 6/14 per cardiology recommendation.   WARFARIN: INR 2.6. Level is therapeutic.    Will dose Warfarin 5mg  this evening x 1 dose  Will continue to monitor CBC and recheck aPTT/INR w/ am labs  Lu Duffel, PharmD, BCPS Clinical Pharmacist 09/28/2018 2:11 PM

## 2018-09-28 NOTE — Progress Notes (Signed)
[facility]  ROUNDING NOTE   Subjective:  Urine output yesterday was only 750 cc. Creatinine up to 10.19.  Objective:  Vital signs in last 24 hours:  Temp:  [98.2 F (36.8 C)-99.8 F (37.7 C)] 99.8 F (37.7 C) (06/11 1523) Pulse Rate:  [97-108] 101 (06/11 1523) Resp:  [17-20] 17 (06/11 1523) BP: (142-169)/(84-102) 161/94 (06/11 1523) SpO2:  [94 %-100 %] 94 % (06/11 1523)  Weight change:  Filed Weights   09/23/18 1300 09/24/18 1640 09/24/18 1930  Weight: (!) 151.4 kg (!) 153.7 kg (!) 153.7 kg    Intake/Output: I/O last 3 completed shifts: In: 1898.5 [P.O.:480; I.V.:391.9; IV Piggyback:1026.5] Out: 1050 [Urine:1050]   Intake/Output this shift:  Total I/O In: 17.1 [I.V.:17.1] Out: 700 [Urine:700]  Physical Exam: General: NAD  Head: Moist oral mucosal membranes  Eyes: Anicteric   Lungs:  Scatterd rhonchi, normal effort  Heart: S1S2 no rubs  Abdomen:  Soft, nontender, obese, distended  Extremities: + peripheral edema., b/l knee dressing  Neurologic: Awake, alert, conversant  Skin: warm        Basic Metabolic Panel: Recent Labs  Lab 09/22/18 2013 09/23/18 0407 09/24/18 0327 09/25/18 0444 09/26/18 1027 09/27/18 0512 09/28/18 0817  NA 142 144 144 142 140 138  --   K 3.2* 3.2* 3.1* 3.4* 2.9* 3.4*  --   CL 107 109 105 103 102 101  --   CO2 22 23 28 24 25 25   --   GLUCOSE 222* 104* 158* 234* 100* 121*  --   BUN 55* 60* 56* 52* 66* 52*  --   CREATININE 8.89* 9.18* 8.30* 7.57* 9.23* 8.38* 10.19*  CALCIUM 7.9* 8.1* 7.6* 7.7* 7.7* 7.6*  --   MG 2.2 2.2  --  1.9  --   --   --   PHOS 4.0 4.1 3.7 4.5 4.4  --   --     Liver Function Tests: Recent Labs  Lab 09/22/18 0411 09/23/18 0407 09/24/18 0327 09/25/18 0444 09/26/18 1027  AST 44* 48*  --  56*  --   ALT 27 28  --  37  --   ALKPHOS 62 59  --  64  --   BILITOT 0.8 0.5  --  0.7  --   PROT 6.1* 6.4*  --  6.0*  --   ALBUMIN 1.8* 1.9* 1.9* 2.0* 1.8*   No results for input(s): LIPASE, AMYLASE  in the last 168 hours. Recent Labs  Lab 09/22/18 2014  AMMONIA 10    CBC: Recent Labs  Lab 09/22/18 0411  09/23/18 0407 09/24/18 0327 09/25/18 0444 09/26/18 0326 09/27/18 0512 09/28/18 0253  WBC 10.3   < > 9.9 8.6 9.8 10.4 12.1* 10.4  NEUTROABS 8.5*  --  7.5  --   --   --   --   --   HGB 7.8*   < > 7.7* 7.0* 8.2* 8.8* 9.7* 8.9*  HCT 24.7*   < > 23.8* 22.3* 25.4* 26.9* 29.7* 27.3*  MCV 84.3   < > 82.4 84.2 83.8 83.5 83.0 82.0  PLT 124*   < > 102* 99* 150 227 274 373   < > = values in this interval not displayed.    Cardiac Enzymes: Recent Labs  Lab 09/23/18 1207 09/26/18 0326  CKTOTAL 100 90    BNP: Invalid input(s): POCBNP  CBG: Recent Labs  Lab 09/27/18 1707 09/27/18 2123 09/28/18 0811 09/28/18 0954 09/28/18 1154  GLUCAP 113* 101* 76 118* 141*  Microbiology: Results for orders placed or performed during the hospital encounter of 09/12/18  Blood Culture (routine x 2)     Status: None   Collection Time: 09/12/18  9:56 AM   Specimen: BLOOD LEFT HAND  Result Value Ref Range Status   Specimen Description BLOOD LEFT HAND  Final   Special Requests   Final    BOTTLES DRAWN AEROBIC AND ANAEROBIC Blood Culture adequate volume   Culture   Final    NO GROWTH 5 DAYS Performed at [facility], [address], [place], [place] [address]    Report Status 09/17/2018 FINAL  Final  Urine culture     Status: None   Collection Time: 09/12/18 10:01 AM   Specimen: Urine, Random  Result Value Ref Range Status   Specimen Description   Final    URINE, RANDOM Performed at [facility], [address], [place], [place] [address]    Special Requests   Final    NONE Performed at [facility], [address] [place], [place] [address]    Culture   Final    NO GROWTH Performed at [facility], [place] [address], [place], [place] [address]    Report Status 09/13/2018 FINAL  Final  SARS Coronavirus 2 (CEPHEID- Performed in [place]  hospital lab), Hosp Order     Status: None   Collection Time: 09/12/18 10:02 AM   Specimen: Nasopharyngeal Swab  Result Value Ref Range Status   SARS Coronavirus 2 NEGATIVE NEGATIVE Final    Comment: (NOTE) If result is NEGATIVE SARS-CoV-2 target nucleic acids are NOT DETECTED. The SARS-CoV-2 RNA is generally detectable in upper and lower  respiratory specimens during the acute phase of infection. The lowest  concentration of SARS-CoV-2 viral copies this assay can detect is 250  copies / mL. A negative result does not preclude SARS-CoV-2 infection  and should not be used as the sole basis for treatment or other  patient management decisions.  A negative result may occur with  improper specimen collection / handling, submission of specimen other  than nasopharyngeal swab, presence of viral mutation(s) within the  areas targeted by this assay, and inadequate number of viral copies  (<250 copies / mL). A negative result must be combined with clinical  observations, patient history, and epidemiological information. If result is POSITIVE SARS-CoV-2 target nucleic acids are DETECTED. The SARS-CoV-2 RNA is generally detectable in upper and lower  respiratory specimens dur ing the acute phase of infection.  Positive  results are indicative of active infection with SARS-CoV-2.  Clinical  correlation with patient history and other diagnostic information is  necessary to determine patient infection status.  Positive results do  not rule out bacterial infection or co-infection with other viruses. If result is PRESUMPTIVE POSTIVE SARS-CoV-2 nucleic acids MAY BE PRESENT.   A presumptive positive result was obtained on the submitted specimen  and confirmed on repeat testing.  While 2019 novel coronavirus  (SARS-CoV-2) nucleic acids may be present in the submitted sample  additional confirmatory testing may be necessary for epidemiological  and / or clinical management purposes  to differentiate  between  SARS-CoV-2 and other Sarbecovirus currently known to infect humans.  If clinically indicated additional testing with an alternate test  methodology [phone]) is advised. The SARS-CoV-2 RNA is generally  detectable in upper and lower respiratory sp ecimens during the acute  phase of infection. The expected result is Negative. Fact Sheet for Patients:  StrictlyIdeas.no Fact Sheet  for Healthcare Providers: BankingDealers.co.za This test is not yet approved or cleared by the [place] and has been authorized for detection and/or diagnosis of SARS-CoV-2 by FDA under an Emergency Use Authorization (EUA).  This EUA will remain in effect (meaning this test can be used) for the duration of the COVID-19 declaration under Section 564(b)(1) of the Act, 21 U.S.C. section 360bbb-3(b)(1), unless the authorization is terminated or revoked sooner. Performed at [facility], [address] [place], [place] [address]   Blood Culture (routine x 2)     Status: None   Collection Time: 09/12/18 10:06 AM   Specimen: BLOOD  Result Value Ref Range Status   Specimen Description BLOOD LEFT ANTECUBITAL  Final   Special Requests   Final    BOTTLES DRAWN AEROBIC AND ANAEROBIC Blood Culture adequate volume   Culture   Final    NO GROWTH 5 DAYS Performed at [facility], [address] [place], [place] [address]    Report Status 09/17/2018 FINAL  Final  MRSA PCR Screening     Status: None   Collection Time: 09/12/18  7:04 PM   Specimen: Nasal Mucosa; Nasopharyngeal  Result Value Ref Range Status   MRSA by PCR NEGATIVE NEGATIVE Final    Comment:        The GeneXpert MRSA Assay (FDA approved for NASAL specimens only), is one component of a comprehensive MRSA colonization surveillance program. It is not intended to diagnose MRSA infection nor to guide or monitor treatment for MRSA infections. Performed at [facility], [address] [place], [place] [address]   C difficile quick scan w PCR reflex     Status: None   Collection Time: 09/14/18  2:21 PM   Specimen: STOOL  Result Value Ref Range Status   C Diff antigen NEGATIVE NEGATIVE Final   C Diff toxin NEGATIVE NEGATIVE Final   C Diff interpretation No C. difficile detected.  Final    Comment: Performed at [facility], [address] [place], [place] [address]  Group A Strep by PCR     Status: None   Collection Time: 09/15/18  1:53 PM  Result Value Ref Range Status   Group A Strep by PCR NOT DETECTED NOT DETECTED Final    Comment: Performed at [facility], [address], [place], [place] [address]  Body fluid culture     Status: None   Collection Time: 09/15/18  5:13 PM   Specimen: KNEE  Result Value Ref Range Status   Specimen Description   Final    KNEE Performed at [facility], [address] [place], [place] [address]    Special Requests   Final    NONE Performed at [facility], [address] [place], [place] [address]    Gram Stain   Final    RARE RBCS FEW WBC SEEN NO ORGANISMS SEEN Performed at [facility], [address] [place],  [address]    Culture   Final    NO GROWTH 3 DAYS Performed at [facility], [place] [address], [place],  [address]    Report Status 09/19/2018 FINAL  Final  Body fluid culture     Status: None   Collection Time: 09/16/18  8:55 AM   Specimen: KNEE  Result Value Ref Range Status   Specimen Description KNEE RIGHT  Final   Special Requests NONE  Final   Gram Stain   Final  RARE WBC PRESENT, PREDOMINANTLY PMN NO ORGANISMS SEEN    Culture   Final    NO GROWTH 3 DAYS Performed at [place] [address] [place], [place] [address]    Report Status 09/20/2018 FINAL  Final  Aerobic/Anaerobic Culture (surgical/deep wound)     Status: None   Collection Time: 09/17/18 11:32 AM   Specimen: Wound   Result Value Ref Range Status   Specimen Description   Final    TISSUE LEFT KNEE 1 Performed at [facility], [address] [place], [place] [address]    Special Requests PATIENT ON FOLLOWING MAXIPIME VANCOMYCIN  Final   Gram Stain   Final    FEW WBC PRESENT,BOTH PMN AND MONONUCLEAR NO ORGANISMS SEEN    Culture   Final    No growth aerobically or anaerobically. Performed at [facility], [place] [address] [place], [place] [address]    Report Status 09/22/2018 FINAL  Final  Acid Fast Smear (AFB)     Status: None   Collection Time: 09/17/18 11:32 AM   Specimen: Synovium  Result Value Ref Range Status   AFB Specimen Processing Comment  Final    Comment: Tissue Grinding and Digestion/Decontamination   Acid Fast Smear Negative  Final    Comment: (NOTE) Performed At: [facility] [place], [place] [id number] Rush Farmer MD [id number]    Source (AFB) TISSUE  Final    Comment: LEFT KNEE Performed at [facility], [place] [address] [place], [place] [address]   Culture, fungus without smear     Status: None (Preliminary result)   Collection Time: 09/17/18 11:34 AM   Specimen: Synovium; Other  Result Value Ref Range Status   Specimen Description   Final    SYNOVIAL Performed at [facility], [address] [place], [place] [address]    Special Requests   Final    NONE Performed at [facility], [address] [place], [place] [address]    Culture   Final    NO FUNGUS ISOLATED AFTER 8 DAYS Performed at [facility], [place] [address], [place], [place] [address]    Report Status PENDING  Incomplete  Acid Fast Culture with reflexed sensitivities     Status: None   Collection Time: 09/17/18 11:36 AM   Specimen: Synovium  Result Value Ref Range Status   Acid Fast Culture CANC  Final    Comment: (NOTE) Test cancelled at client's request.      Cancelled per Levin Erp 09/21/2018 Performed At: [facility] [place], [place] [id number] Rush Farmer MD [id number]    Source of Sample TISSUE  Final    Comment: RIGHT KNEE Performed at [facility], [place] [address], [place], [place] [address]   Aerobic/Anaerobic Culture (surgical/deep wound)     Status: None   Collection Time: 09/17/18 11:43 AM   Specimen: Wound  Result Value Ref Range Status   Specimen Description   Final    TISSUE LEFT KNEE 2 Performed at [facility], [address] [place] [place], [place] [address]    Special Requests PATIENT ON FOLLOWING MAXIPIME VANCOMYCIN  Final   Gram Stain   Final    FEW WBC PRESENT, PREDOMINANTLY PMN NO ORGANISMS SEEN    Culture   Final    No growth aerobically or anaerobically. Performed at [facility], [place] [address], [place],  [address]  Report Status 09/22/2018 FINAL  Final  Aerobic/Anaerobic Culture (surgical/deep wound)     Status: None   Collection Time: 09/17/18 11:44 AM   Specimen: Wound  Result Value Ref Range Status   Specimen Description   Final    TISSUE LEFT KNEE 3 Performed at [facility], [address] [place], [place] [address]    Special Requests PATIENT ON FOLLOWING MAXIPIME VANCOMYCIN  Final   Gram Stain   Final    MODERATE WBC PRESENT, PREDOMINANTLY PMN NO ORGANISMS SEEN    Culture   Final    No growth aerobically or anaerobically. Performed at [facility], [place] [address], [place], [place] [address]    Report Status 09/22/2018 FINAL  Final  Aerobic/Anaerobic Culture (surgical/deep wound)     Status: None   Collection Time: 09/18/18  9:43 PM   Specimen: [facility] Other; Tissue  Result Value Ref Range Status   Specimen Description   Final    SYNOVIAL Performed at [facility], [address], [place], [place] [address]    Special Requests   Final    NONE Performed at [facility], [address] [place], [place] [address]    Gram Stain   Final    RARE WBC  PRESENT, PREDOMINANTLY MONONUCLEAR NO ORGANISMS SEEN    Culture   Final    No growth aerobically or anaerobically. Performed at [facility], [place] [address], [place], [place] [address]    Report Status 09/24/2018 FINAL  Final  Aerobic/Anaerobic Culture (surgical/deep wound)     Status: None   Collection Time: 09/18/18  9:44 PM   Specimen: [facility] Other; Tissue  Result Value Ref Range Status   Specimen Description   Final    SYNOVIAL Performed at [facility], [address], [place], [place] [address]    Special Requests   Final    RIGHT KNEE Performed at [facility], [place], [place] [address]    Gram Stain   Final    FEW WBC PRESENT,BOTH PMN AND MONONUCLEAR NO ORGANISMS SEEN    Culture   Final    No growth aerobically or anaerobically. Performed at [facility], [place] [address], [place], [place] [address]    Report Status 09/24/2018 FINAL  Final  Aerobic/Anaerobic Culture (surgical/deep wound)     Status: None   Collection Time: 09/18/18  9:45 PM   Specimen: [facility] Other; Tissue  Result Value Ref Range Status   Specimen Description   Final    SYNOVIAL Performed at [facility], [address] [place], [place] [address]    Special Requests   Final    RIGHT KNEE Performed at [facility], [address] [place], [place] [address]    Gram Stain   Final    FEW WBC PRESENT, PREDOMINANTLY MONONUCLEAR NO ORGANISMS SEEN    Culture   Final    No growth aerobically or anaerobically. Performed at [facility], [place] [address], [place], [place] [address]    Report Status 09/24/2018 FINAL  Final  Culture, blood (Routine X 2) w Reflex to ID Panel     Status: None   Collection Time: 09/19/18  1:11 PM   Specimen: BLOOD  Result Value Ref Range Status   Specimen Description BLOOD LEFT ANTECUBITAL  Final   Special Requests   Final    BOTTLES DRAWN AEROBIC AND ANAEROBIC Blood Culture results may not be optimal  due to an excessive volume of blood received in culture bottles   Culture   Final    NO GROWTH 5 DAYS Performed at [facility] [address] [place], [place] [address]    Report Status 09/24/2018 FINAL  Final  Culture, blood (Routine X 2) w Reflex to ID Panel     Status: None   Collection Time: 09/19/18  1:20 PM   Specimen: BLOOD  Result Value Ref Range Status   Specimen Description BLOOD BLOOD RIGHT HAND  Final   Special Requests   Final    BOTTLES DRAWN AEROBIC AND ANAEROBIC Blood Culture results may not be optimal due to an excessive volume of blood received in culture bottles   Culture   Final    NO GROWTH 5 DAYS Performed at [facility], [address], [place], [place] [address]    Report Status 09/24/2018 FINAL  Final  Urine Culture     Status: None   Collection Time: 09/20/18  3:59 PM   Specimen: Urine, Random  Result Value Ref Range Status   Specimen Description   Final    URINE, RANDOM Performed at [facility], [address] [place], [place] [address]    Special Requests   Final    NONE Performed at [facility], [address], [place], [place] [address]    Culture   Final    NO GROWTH Performed at [facility], [place] [address], [place], [place] [address]    Report Status 09/21/2018 FINAL  Final  C difficile quick scan w PCR reflex     Status: None   Collection Time: 09/21/18  3:27 PM   Specimen: STOOL  Result Value Ref Range Status   C Diff antigen NEGATIVE NEGATIVE Final   C Diff toxin NEGATIVE NEGATIVE Final   C Diff interpretation No C. difficile detected.  Final    Comment: Performed at [facility], [address] [place], [place] [address]  Gastrointestinal Panel by PCR , Stool     Status: None   Collection Time: 09/21/18  3:27 PM   Specimen: Stool  Result Value Ref Range Status   Campylobacter species NOT DETECTED NOT DETECTED Final   Plesimonas shigelloides NOT DETECTED NOT DETECTED  Final   Salmonella species NOT DETECTED NOT DETECTED Final   Yersinia enterocolitica NOT DETECTED NOT DETECTED Final   Vibrio species NOT DETECTED NOT DETECTED Final   Vibrio cholerae NOT DETECTED NOT DETECTED Final   Enteroaggregative E coli (EAEC) NOT DETECTED NOT DETECTED Final   Enteropathogenic E coli (EPEC) NOT DETECTED NOT DETECTED Final   Enterotoxigenic E coli (ETEC) NOT DETECTED NOT DETECTED Final   Shiga like toxin producing E coli (STEC) NOT DETECTED NOT DETECTED Final   Shigella/Enteroinvasive E coli (EIEC) NOT DETECTED NOT DETECTED Final   Cryptosporidium NOT DETECTED NOT DETECTED Final   Cyclospora cayetanensis NOT DETECTED NOT DETECTED Final   Entamoeba histolytica NOT DETECTED NOT DETECTED Final   Giardia lamblia NOT DETECTED NOT DETECTED Final   Adenovirus F40/41 NOT DETECTED NOT DETECTED Final   Astrovirus NOT DETECTED NOT DETECTED Final   Norovirus GI/GII NOT DETECTED NOT DETECTED Final   Rotavirus A NOT DETECTED NOT DETECTED Final   Sapovirus (I, II, IV, and V) NOT DETECTED NOT DETECTED Final    Comment: Performed at [facility], [address] [place], [place] [address]    Coagulation Studies: Recent Labs    09/26/18 1508 09/27/18 0512 09/28/18  0253  LABPROT 22.3* 24.2* 27.5*  INR 2.0* 2.2* 2.6*    Urinalysis: No results for input(s): COLORURINE, LABSPEC, PHURINE, GLUCOSEU, HGBUR, BILIRUBINUR, KETONESUR, PROTEINUR, UROBILINOGEN, NITRITE, LEUKOCYTESUR in the last 72 hours.  Invalid input(s): APPERANCEUR    Imaging: No results found.   Medications:   . sodium chloride Stopped (09/28/18 0355)  . sodium chloride    . anticoagulant sodium citrate    . cefTAZidime (FORTAZ)  IV    . DAPTOmycin (CUBICIN)  IV Stopped (09/27/18 2126)   . aspirin EC  81 mg Oral Daily  . atenolol  25 mg Oral Daily  . budesonide (PULMICORT) nebulizer solution  0.5 mg Nebulization BID  . Chlorhexidine Gluconate Cloth  6 each Topical Q0600  . citalopram  20 mg  Oral Daily  . feeding supplement (NEPRO CARB STEADY)  237 mL Oral BID BM  . fluticasone furoate-vilanterol  1 puff Inhalation Daily  . folic acid  1 mg Oral Daily  . insulin aspart  0-5 Units Subcutaneous QHS  . insulin aspart  0-9 Units Subcutaneous TID WC  . insulin glargine  10 Units Subcutaneous Daily  . mouth rinse  15 mL Mouth Rinse BID  . multivitamin  1 tablet Oral Q1200  . multivitamin with minerals  1 tablet Oral Daily  . omega-3 acid ethyl esters  1 g Oral Daily  . rosuvastatin  20 mg Oral Daily  . sodium chloride flush  10-40 mL Intracatheter Q12H  . sodium chloride flush  3 mL Intravenous Q12H  . tuberculin  5 Units Intradermal Once  . warfarin  5 mg Oral ONCE-1800  . Warfarin - Pharmacist Dosing Inpatient   Does not apply q1800   sodium chloride, sodium chloride, acetaminophen **OR** acetaminophen, albuterol, alum & mag hydroxide-simeth, anticoagulant sodium citrate, fentaNYL (SUBLIMAZE) injection, HYDROcodone-acetaminophen, iohexol, ipratropium-albuterol, menthol-cetylpyridinium **OR** phenol, menthol-cetylpyridinium **OR** phenol, metoCLOPramide **OR** metoCLOPramide (REGLAN) injection, ondansetron **OR** ondansetron (ZOFRAN) IV, sodium chloride flush, sodium chloride flush  Assessment/ Plan:  [name] is a 58 y.o. black male with hyeprtension, COPD, diabetes mellitus type II who underwent bilateral knee replacement surgeries last week. who was admitted to [facility] on 09/12/2018 for sepsis  1. Acute renal failure with metabolic acidosis  baseline creatinine of 1.07 with normal GFR in 06/12/18.  Likely severe ATN from hypotension and sepsis -Patient continues to have significantly diminished renal function.  We will reassess the patient for hemodialysis again tomorrow as his creatinine is up to 10.  Urine output was only 750 cc over the preceding 24 hours.  Not yet ready for discharge.  2. Septic shock/hypotension  -Recent bilateral knee replacement on Sep 01, 2018 at  [facility]  -  knee septic arthritis, patient underwent I&D and polyethylene exchange on 5/31 and Rt knee on 6/1 -abx management per ID/hospitalist.   3. Hypokalemia -Potassium still slightly low at 3.4.  Continue to monitor.       LOS: 16 Alyssabeth Bruster 6/11/20203:45 PM

## 2018-09-28 NOTE — Consult Note (Addendum)
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  Patient Measurements: Height: 6\' 4"  (193 cm) Weight: (una) IBW/kg (Calculated) : 86.8  Vital Signs: Temp: 98.5 F (36.9 C) (06/11 0304) Temp Source: Oral (06/11 0009) BP: 155/88 (06/11 0436) Pulse Rate: 97 (06/11 0436)  Labs: Recent Labs    09/26/18 0326 09/26/18 1027 09/26/18 1508 09/27/18 0512 09/28/18 0253  HGB 8.8*  --   --  9.7* 8.9*  HCT 26.9*  --   --  29.7* 27.3*  PLT 227  --   --  274 373  APTT 55*  --   --  71* 88*  LABPROT  --   --  22.3* 24.2* 27.5*  INR  --   --  2.0* 2.2* 2.6*  CREATININE  --  9.23*  --  8.38*  --   CKTOTAL 90  --   --   --   --     Estimated Creatinine Clearance: 15.1 mL/min (A) (by C-G formula based on SCr of 8.38 mg/dL (H)).   Medical History: Past Medical History:  Diagnosis Date  . Allergy   . Anxiety   . Arthritis    knees  . Asthma, mild intermittent, well-controlled   . Chronic obstructive asthma ([place])   . COPD (chronic obstructive pulmonary disease) ([place])   . Depression with anxiety   . Diabetes ([place])   . GERD (gastroesophageal reflux disease)   . HBP (high blood pressure)   . High cholesterol   . Renal cyst, right   . Sleep apnea    CPAP  . Swelling     Medications:  Scheduled:  . aspirin EC  81 mg Oral Daily  . budesonide (PULMICORT) nebulizer solution  0.5 mg Nebulization BID  . Chlorhexidine Gluconate Cloth  6 each Topical Q0600  . feeding supplement (NEPRO CARB STEADY)  237 mL Oral BID BM  . folic acid  1 mg Oral Daily  . insulin aspart  0-5 Units Subcutaneous QHS  . insulin aspart  0-9 Units Subcutaneous TID WC  . insulin glargine  10 Units Subcutaneous Daily  . mouth rinse  15 mL Mouth Rinse BID  . multivitamin  1 tablet Oral Q1200  . sodium chloride flush  10-40 mL Intracatheter Q12H  . sodium chloride flush  3 mL Intravenous Q12H   . tuberculin  5 Units Intradermal Once  . Warfarin - Pharmacist Dosing Inpatient   Does not apply q1800    Assessment: 58 yr male had B/l knee replacements Sep 01, 2018 was admitted with fever, AKI/ Platelet count has been declining since 6/5. He was started on heparin 6/3. 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 Level remains therapeutic but continues to  trend upward. Will reduce infusion rate to 0.4 mcg/kg/min. Recheck aPTT 2 hours after infusion rate change. Stop date for argatroban 6/14 per cardiology recommendation.   WARFARIN: INR 2.6. Level is therapeutic.    Will dose Warfarin 5mg  this evening x 1 dose  Will continue to monitor CBC and recheck aPTT/INR w/ am labs  Pernell Dupre, PharmD, BCPS Clinical Pharmacist 09/28/2018 5:25 AM

## 2018-09-28 NOTE — TOC Progression Note (Signed)
Transition of Care [facility]) - Progression Note    Patient Details  Name: Isaac Cross MRN: [id number] Date of Birth: Dec 20, 1960  Transition [facility]) CM/SW Contact  Sergei Delo, Lenice Llamas Phone Number: [phone]  09/28/2018, 2:17 PM  Clinical Narrative:  Per Otila Kluver [facility] liaison [facility] SN[facility] authorization has been received. Clinical Social Worker (CSW) contacted patient's wife [name] and made her aware of above. CSW will continue to follow and assist as needed.      Expected Discharge Plan: [place] Barriers to Discharge: Continued Medical Work up  Expected Discharge Plan and Services Expected Discharge Plan: [place] In-house Referral: Clinical Social Work Discharge Planning Services: CM Consult Post Acute Care Choice: [place] arrangements for the past 2 months: [place]: (Patient stated that he is open to home health. ) Date [place]: 09/18/18 Time [place]: [place] Representative spoke with at [place]: [name]- unsure of when patient will discharge.   Social Determinants of Health (SDOH) Interventions    Readmission Risk Interventions No flowsheet data found.

## 2018-09-28 NOTE — Progress Notes (Signed)
Patient blood pressure elevated. Provider notified, new orders placed/ will continue to monitor.

## 2018-09-28 NOTE — Progress Notes (Signed)
[facility] [place] NAME: Isaac Cross    MR#:  [id number]  DATE OF BIRTH:  1961/03/06  SUBJECTIVE:   - more alert and conversive. -Renal function worsening although making urine  REVIEW OF SYSTEMS:  Review of Systems  Constitutional: Negative for chills, fever and weight loss.  HENT: Negative for ear discharge, ear pain and nosebleeds.   Eyes: Negative for blurred vision, pain and discharge.  Respiratory: Negative for sputum production, shortness of breath, wheezing and stridor.   Cardiovascular: Negative for chest pain, palpitations, orthopnea and PND.  Gastrointestinal: Negative for abdominal pain, diarrhea, nausea and vomiting.  Genitourinary: Negative for frequency and urgency.  Musculoskeletal: Negative for back pain and joint pain.  Neurological: Positive for weakness. Negative for sensory change, speech change and focal weakness.  Psychiatric/Behavioral: Negative for depression and hallucinations. The patient is not nervous/anxious.    DRUG ALLERGIES:   Allergies  Allergen Reactions  . Heparin     Heparin antibody positive; SRA pending  . Lipitor [Atorvastatin] Hives and Itching  . Other   . Viagra  [Sildenafil Citrate]     vision loss    VITALS:  Blood pressure (!) 153/84, pulse (!) 102, temperature 98.3 F (36.8 C), resp. rate 17, height 6\' 4"  (1.93 m), weight (!) 153.7 kg, SpO2 100 %.  PHYSICAL EXAMINATION:  Physical Exam   GENERAL:  58 y.o.-year-old patient lying in the bed with no acute distress. Critically ill appearing EYES: Pupils equal, round, reactive to light and accommodation. No scleral icterus.  HEENT: Head atraumatic, normocephalic. Oropharynx and nasopharynx clear.  NECK:  Supple, no jugular venous distention. No thyroid enlargement, no tenderness.  LUNGS: Normal breath sounds bilaterally, no wheezing, rales,rhonchi or crepitation. No use of accessory muscles of respiration.  Decreased bibasilar  breath sounds CARDIOVASCULAR: S1, S2 normal. No murmurs, rubs, or gallops. left IJ dialysis catheter  ABDOMEN: Soft, nontender, nondistended. Bowel sounds present. No organomegaly or mass. Foley+ EXTREMITIES: Swelling of both legs, knees noted with recent surgery.  No  cyanosis, or clubbing.  Both hands are swollen as well. NEUROLOGIC: Cranial nerves II through XII are intact. Moving all extremities well PSYCHIATRIC: patient is alert and oriented x2 SKIN: No obvious rash, lesion, or ulcer.    LABORATORY PANEL:   CBC Recent Labs  Lab 09/28/18 0253  WBC 10.4  HGB 8.9*  HCT 27.3*  PLT 373   ------------------------------------------------------------------------------------------------------------------  Chemistries  Recent Labs  Lab 09/25/18 0444  09/27/18 0512 09/28/18 0817  NA 142   < > 138  --   K 3.4*   < > 3.4*  --   CL 103   < > 101  --   CO2 24   < > 25  --   GLUCOSE 234*   < > 121*  --   BUN 52*   < > 52*  --   CREATININE 7.57*   < > 8.38* 10.19*  CALCIUM 7.7*   < > 7.6*  --   MG 1.9  --   --   --   AST 56*  --   --   --   ALT 37  --   --   --   ALKPHOS 64  --   --   --   BILITOT 0.7  --   --   --    < > = values in this interval not displayed.   ------------------------------------------------------------------------------------------------------------------  Cardiac Enzymes No results for input(s): TROPONINI  in the last 168 hours. ------------------------------------------------------------------------------------------------------------------  RADIOLOGY:  No results found.  EKG:   Orders placed or performed during the hospital encounter of 09/12/18  . EKG 12-Lead  . EKG 12-Lead    ASSESSMENT AND PLAN:    Isaac Cross a58 y.o.malewith a known history of arthritis, COPD not on home oxygen, hypertension, sleep apnea, non-insulin-dependent diabetes mellitus presents to hospital secondary to dizziness, weakness and a fall  1.Sepsis -s/p  Recent bilateral knee replacement surgeries as outpatient and now bilateral knee washouts done this adm. - fluid Cultures are negative so far. - Fungal cultures negative -Appreciate ID consult.   -Patient was on zosyn, doxycycline --now on ceftazidime and daptomycin (last dose 10/31/2018) -completed 7 days of anidulafungin (last dose today)  -Received IV albumin as well--now d/ced  2. Acute Oliguric renal failure-likely ATN from sepsis and hypotension - appreciate nephrology input. -Hemodialysis started 09/22/2018 -Hold lisinopril and metformin and other nephrotoxins.    -pt has started making urine!! HD on hold -creatinine increasing  3. Acute encephalopathy-secondary to metabolic causes, sepsis and uremia. -No focal deficits -improving mentation  4. 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 Diabetes mellitus-2 - on lantus and cont ssi -Hold metformin given worsening renal failure.  6. DVT prophylaxis- argatroban drip and coumadin  Physical therapy evaluation noted--pt is now agreeable for rehab  Spoke with dater Chanel yday and updated  CODE STATUS: Full code  TOTAL TIME TAKING CARE OF THIS PATIENT: 28 minutes.   POSSIBLE D/C IN ?? DAYS, DEPENDING ON CLINICAL CONDITION.   Fritzi Mandes M.D on 09/28/2018 at 11:10 AM  Between 7am to 6pm - Pager - [phone] After 6pm go to [email or web address] - password [email or web address]  Office  [phone]  CC: Primary care physician; Steele Sizer, MD

## 2018-09-28 NOTE — Progress Notes (Signed)
Subjective:  Patient reports pain as mild.    Objective:   VITALS:   Vitals:   09/28/18 0319 09/28/18 0436 09/28/18 0531 09/28/18 0808  BP: (!) 150/100 (!) 155/88 (!) 169/97 (!) 153/84  Pulse:  97  (!) 102  Resp:    17  Temp:    98.3 F (36.8 C)  TempSrc:      SpO2:    100%  Weight:      Height:        PHYSICAL EXAM:  Neurovascular intact Dorsiflexion/Plantar flexion intact Incision: dressing C/D/I No cellulitis present  LABS  Results for orders placed or performed during the hospital encounter of 09/12/18 (from the past 24 hour(s))  Glucose, capillary     Status: Abnormal   Collection Time: 09/27/18 12:06 PM  Result Value Ref Range   Glucose-Capillary 132 (H) 70 - 99 mg/dL  Glucose, capillary     Status: Abnormal   Collection Time: 09/27/18  5:07 PM  Result Value Ref Range   Glucose-Capillary 113 (H) 70 - 99 mg/dL  Glucose, capillary     Status: Abnormal   Collection Time: 09/27/18  9:23 PM  Result Value Ref Range   Glucose-Capillary 101 (H) 70 - 99 mg/dL  Protime-INR     Status: Abnormal   Collection Time: 09/28/18  2:53 AM  Result Value Ref Range   Prothrombin Time 27.5 (H) 11.4 - 15.2 seconds   INR 2.6 (H) 0.8 - 1.2  APTT     Status: Abnormal   Collection Time: 09/28/18  2:53 AM  Result Value Ref Range   aPTT 88 (H) 24 - 36 seconds  CBC     Status: Abnormal   Collection Time: 09/28/18  2:53 AM  Result Value Ref Range   WBC 10.4 4.0 - 10.5 K/uL   RBC 3.33 (L) 4.22 - 5.81 MIL/uL   Hemoglobin 8.9 (L) 13.0 - 17.0 g/dL   HCT 27.3 (L) 39.0 - 52.0 %   MCV 82.0 80.0 - 100.0 fL   MCH 26.7 26.0 - 34.0 pg   MCHC 32.6 30.0 - 36.0 g/dL   RDW 15.5 11.5 - 15.5 %   Platelets 373 150 - 400 K/uL   nRBC 0.0 0.0 - 0.2 %  Sedimentation rate     Status: Abnormal   Collection Time: 09/28/18  2:53 AM  Result Value Ref Range   Sed Rate 69 (H) 0 - 20 mm/hr  C-reactive protein     Status: Abnormal   Collection Time: 09/28/18  2:53 AM  Result Value Ref Range   CRP  19.1 (H) <1.0 mg/dL  Glucose, capillary     Status: None   Collection Time: 09/28/18  8:11 AM  Result Value Ref Range   Glucose-Capillary 76 70 - 99 mg/dL   Comment 1 Notify RN   APTT     Status: Abnormal   Collection Time: 09/28/18  8:17 AM  Result Value Ref Range   aPTT 80 (H) 24 - 36 seconds  Creatinine, serum     Status: Abnormal   Collection Time: 09/28/18  8:17 AM  Result Value Ref Range   Creatinine, Ser 10.19 (H) 0.61 - 1.24 mg/dL   GFR calc non Af Amer 5 (L) >60 mL/min   GFR calc Af Amer 6 (L) >60 mL/min  Glucose, capillary     Status: Abnormal   Collection Time: 09/28/18  9:54 AM  Result Value Ref Range   Glucose-Capillary 118 (H) 70 - 99 mg/dL  Comment 1 Notify RN     No results found.  Assessment/Plan: 10 Days Post-Op   Active Problems:   Sepsis ([place])   Acute renal failure ([place])   Septic shock ([place])   Up with therapy  Discharge planning Will remove staples in 1 week   Lovell Sheehan , MD 09/28/2018, 11:18 AM

## 2018-09-28 NOTE — TOC Progression Note (Signed)
Transition of Care [facility]) - Progression Note    Patient Details  Name: Isaac Cross MRN: [id number] Date of Birth: 1960/11/03  Transition of Care [facility]) CM/SW [place], RN Phone Number: 09/28/2018, 11:59 AM  Clinical Narrative:     Damaris Schooner with Estill Bamberg from [employer], She alerted me that Dr Holley Raring has determined that the patient will in fact need outpatient dialysis due to Creatinine levels increasing, She stated that he was going to call the patient's wife and discuss and he wanted all others to wait until he calls her before any other care team member calls for dialysis information.  I let Estill Bamberg know that the patient has accepted a bed offer from [facility].  She stated that after Dr. Holley Raring calls the wife then she will call and get the Dialysis center set up and arranged.  She stated that she has all of the needed information and papers already.  She will touch base with me once everything is set up and arranged for dialysis Expected Discharge Plan: [place] Barriers to Discharge: Continued Medical Work up  Expected Discharge Plan and Services Expected Discharge Plan: [place] In-house Referral: Clinical Social Work Discharge Planning Services: CM Consult Post Acute Care Choice: [place] arrangements for the past 2 months: [place]: (Patient stated that he is open to home health. ) Date [place]: 09/18/18 Time [place]: [place] Representative spoke with at [place]: [name]- unsure of when patient will discharge.   Social Determinants of Health (SDOH) Interventions    Readmission Risk Interventions No flowsheet data found.

## 2018-09-28 NOTE — Progress Notes (Signed)
Physical Therapy Treatment Patient Details Name: Isaac Cross MRN: [id number] DOB: [birth year] Today's Date: 09/28/2018    History of Present Illness Pt is a 58 y.o. male presenting to hospital 09/12/18 after rolling OOB landing on L knee; pt with increased dizziness x2 days, fevers x3 days, and generalized weakness.  S/p B TKR 09/01/18 in [place] (pt reports discharging home same day).  Pt admitted with sepsis, acute renal failure, and B TKR.  Pt s/p L knee 5/31 and R knee 6/1 I&D with poly exchange.  Pt noted with severe encephalopathy from DT's with extensive ETOH abuse.  L LE DVT noted.  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  HD started 6/5 with L IJ temporary HD catheter.  PMH includes B TKR 09/01/18 in [place], DM, asthma, R vein surgery, COPD.    PT Comments    Pt oriented to person, place, situation, and partial time today (improved compared to yesterday).  Pt still noted with difficulty with initiation, sequencing, and requiring increased time to process information; pt also inconsistent with multi-step and single step commands.  Bed height required significant elevation to stand with 2 assist up to bariatric RW and then pt able to side step a few feet to L side with walker and 2 assist.  On 2nd trial standing pt able to come to brief upright posture with walker but then pt started to flex trunk and shift weight forward requiring 2 assist to safely assist pt back to sitting onto edge of bed (to prevent fall forward).  Pt then assisted back to bed.  L knee blocked for standing activities.  Will continue to focus on strengthening, B knee ROM, and progressive functional mobility during hospital stay.   Follow Up Recommendations  CIR     Equipment Recommendations  Rolling walker with 5" wheels;3in1 (PT)(bariatric)    Recommendations for Other Services OT consult     Precautions / Restrictions Precautions Precautions: Fall Precaution Comments: L IJ temporary HD catheter (Per  discussion with Dr. Holley Raring and nephrology note 09/27/18 "Ok for patient to work with PT out of bed with temporary IJ dialysis catheter in place". Restrictions Weight Bearing Restrictions: Yes RLE Weight Bearing: Weight bearing as tolerated LLE Weight Bearing: Weight bearing as tolerated Other Position/Activity Restrictions: WBAT B LE's    Mobility  Bed Mobility Overal bed mobility: Needs Assistance Bed Mobility: Supine to Sit     Supine to sit: Mod assist Sit to supine: +2 for physical assistance;+2 for safety/equipment   General bed mobility comments: assist for LE's > trunk semi-supine to sit with increased time to process/sequence/initiate movement; 2 assist sit to supine for safety (assist trunk and B LE's)  Transfers Overall transfer level: Needs assistance Equipment used: Rolling walker (2 wheeled)(bariatric) Transfers: Sit to/from Stand Sit to Stand: Max assist;+2 physical assistance;From elevated surface         General transfer comment: bed height elevated but unable to stand with 2 assist and required additional significant bed height elevation in order to stand with 2 assist (L knee blocked and 2nd assist in front of pt d/t forward lean when standing); extra time and cueing to come to full stand x2 trials  Ambulation/Gait Ambulation/Gait assistance: Min assist;Mod assist;+2 physical assistance   Assistive device: Rolling walker (2 wheeled)(bariatric)   Gait velocity: decreased   General Gait Details: pt able to side step to L a few feet (up towards head of bed) with vc's for technique and increased time to take steps   Stairs  Wheelchair Mobility    Modified Rankin (Stroke Patients Only)       Balance Overall balance assessment: Needs assistance Sitting-balance support: No upper extremity supported;Feet supported Sitting balance-Leahy Scale: Fair Sitting balance - Comments: steady static sitting   Standing balance support: Bilateral  upper extremity supported Standing balance-Leahy Scale: Poor Standing balance comment: required BUE support on RW; vc's and tactile cues for upright posture required both times standing (pt in forward flexed posture otherwise)                            Cognition Arousal/Alertness: Awake/alert Behavior During Therapy: Flat affect;Impulsive Overall Cognitive Status: Impaired/Different from baseline Area of Impairment: Orientation;Memory;Following commands;Safety/judgement;Awareness;Problem solving                 Orientation Level: Disoriented to;Time Current Attention Level: Selective Memory: Decreased recall of precautions;Decreased short-term memory Following Commands: Follows one step commands inconsistently;Follows multi-step commands inconsistently Safety/Judgement: Decreased awareness of safety;Decreased awareness of deficits Awareness: Anticipatory Problem Solving: Slow processing;Decreased initiation;Difficulty sequencing;Requires verbal cues;Requires tactile cues        Exercises Total Joint Exercises Goniometric ROM: B knee AROM extension 10 degrees short of neutral; R knee AAROM flexion to 80 degrees; L knee AAROM flexion to 90 degrees (ROM measurements performed semi-supine in bed)    General Comments General comments (skin integrity, edema, etc.): L UE noted to be slightly swollen compared to R UE (RN was notified yesterday).  Nursing cleared pt for participation in physical therapy.  Pt agreeable to PT session.      Pertinent Vitals/Pain Pain Assessment: Faces Pain Score: 3  Pain Location: L>R knee Pain Descriptors / Indicators: Sore Pain Intervention(s): Limited activity within patient's tolerance;Monitored during session;Repositioned;Other (comment)(polar care applied L knee)     Home Living                      Prior Function            PT Goals (current goals can now be found in the care plan section) Acute Rehab PT  Goals Patient Stated Goal: to go home PT Goal Formulation: With patient Time For Goal Achievement: 10/13/18 Potential to Achieve Goals: Fair Progress towards PT goals: Progressing toward goals    Frequency    7X/week      PT Plan Current plan remains appropriate    Co-evaluation              AM-PAC PT "6 Clicks" Mobility   Outcome Measure  Help needed turning from your back to your side while in a flat bed without using bedrails?: A Lot Help needed moving from lying on your back to sitting on the side of a flat bed without using bedrails?: A Lot Help needed moving to and from a bed to a chair (including a wheelchair)?: Total Help needed standing up from a chair using your arms (e.g., wheelchair or bedside chair)?: Total Help needed to walk in hospital room?: Total Help needed climbing 3-5 steps with a railing? : Total 6 Click Score: 8    End of Session Equipment Utilized During Treatment: Gait belt Activity Tolerance: Patient tolerated treatment well Patient left: in bed;with call bell/phone within reach;with bed alarm set;with SCD's reapplied;Other (comment);with nursing/sitter in room(B heels elevated via towel rolls; polar care in place and activated to L knee) Nurse Communication: Mobility status;Precautions;Weight bearing status PT Visit Diagnosis: Other abnormalities of gait and mobility (R26.89);Muscle weakness (generalized) (  M62.81);Difficulty in walking, not elsewhere classified (R26.2);Pain Pain - Right/Left: Left Pain - part of body: Knee     Time: 5643-3295 PT Time Calculation (min) (ACUTE ONLY): 44 min  Charges:  $Therapeutic Exercise: 8-22 mins $Therapeutic Activity: 23-37 mins                    Leitha Bleak, PT 09/28/18, 10:24 AM [phone]

## 2018-09-28 NOTE — TOC Progression Note (Signed)
Transition of Care [facility]) - Progression Note    Patient Details  Name: SALAH NAKAMURA MRN: [id number] Date of Birth: [birth year]  Transition of Care [facility]) CM/SW Contact  Yasmina [place], Veronia Beets, [employer] Phone Number: 09/28/2018, 9:12 AM  Clinical Narrative:   Per patient's wife [name] patient is agreeable to SNF. Clinical Education officer, museum (CSW) faxed out [facility] and presented bed offer to patient's wife and she chose [facility]. Per [facility] liaison she will start [facility] SNF authorization today. CSW explained to wife that [employer] will have to approve SNF. Wife verbalized her understanding. CSW will continue to follow and assist as needed.     Expected Discharge Plan: [place] Barriers to Discharge: Continued Medical Work up  Expected Discharge Plan and Services Expected Discharge Plan: [employer] In-house Referral: Clinical Social Work Discharge Planning Services: CM Consult Post Acute Care Choice: [place] arrangements for the past 2 months: [place]: (Patient stated that he is open to home health. ) Date [place]: 09/18/18 Time [place]: [place] Representative spoke with at [place]: [name]- unsure of when patient will discharge.   Social Determinants of Health (SDOH) Interventions    Readmission Risk Interventions No flowsheet data found.

## 2018-09-29 DIAGNOSIS — R410 Disorientation, unspecified: Secondary | ICD-10-CM

## 2018-09-29 LAB — HEPATIC FUNCTION PANEL
ALT: 22 U/L (ref 0–44)
AST: 38 U/L (ref 15–41)
Albumin: 1.8 g/dL — ABNORMAL LOW (ref 3.5–5.0)
Alkaline Phosphatase: 58 U/L (ref 38–126)
Bilirubin, Direct: 0.2 mg/dL (ref 0.0–0.2)
Indirect Bilirubin: 0.5 mg/dL (ref 0.3–0.9)
Total Bilirubin: 0.7 mg/dL (ref 0.3–1.2)
Total Protein: 5.6 g/dL — ABNORMAL LOW (ref 6.5–8.1)

## 2018-09-29 LAB — BASIC METABOLIC PANEL
Anion gap: 15 (ref 5–15)
BUN: 61 mg/dL — ABNORMAL HIGH (ref 6–20)
CO2: 22 mmol/L (ref 22–32)
Calcium: 8.1 mg/dL — ABNORMAL LOW (ref 8.9–10.3)
Chloride: 101 mmol/L (ref 98–111)
Creatinine, Ser: 10.82 mg/dL — ABNORMAL HIGH (ref 0.61–1.24)
GFR calc Af Amer: 5 mL/min — ABNORMAL LOW (ref >60)
GFR calc non Af Amer: 5 mL/min — ABNORMAL LOW (ref >60)
Glucose, Bld: 94 mg/dL (ref 70–99)
Potassium: 3.2 mmol/L — ABNORMAL LOW (ref 3.5–5.1)
Sodium: 138 mmol/L (ref 135–145)

## 2018-09-29 LAB — CBC
HCT: 26.8 % — ABNORMAL LOW (ref 39.0–52.0)
Hemoglobin: 8.6 g/dL — ABNORMAL LOW (ref 13.0–17.0)
MCH: 26.1 pg (ref 26.0–34.0)
MCHC: 32.1 g/dL (ref 30.0–36.0)
MCV: 81.5 fL (ref 80.0–100.0)
Platelets: 499 10*3/uL — ABNORMAL HIGH (ref 150–400)
RBC: 3.29 MIL/uL — ABNORMAL LOW (ref 4.22–5.81)
RDW: 15.3 % (ref 11.5–15.5)
WBC: 11.2 10*3/uL — ABNORMAL HIGH (ref 4.0–10.5)
nRBC: 0 % (ref 0.0–0.2)

## 2018-09-29 LAB — APTT
aPTT: 49 seconds — ABNORMAL HIGH (ref 24–36)
aPTT: 60 seconds — ABNORMAL HIGH (ref 24–36)

## 2018-09-29 LAB — MPO/PR-3 (ANCA) ANTIBODIES
ANCA Proteinase 3: <3.5 U/mL (ref 0.0–3.5)
Myeloperoxidase Abs: <9 U/mL (ref 0.0–9.0)

## 2018-09-29 LAB — PROTIME-INR
INR: 1.6 — ABNORMAL HIGH (ref 0.8–1.2)
Prothrombin Time: 18.7 seconds — ABNORMAL HIGH (ref 11.4–15.2)

## 2018-09-29 LAB — CK: Total CK: 89 U/L (ref 49–397)

## 2018-09-29 LAB — C4 COMPLEMENT: Complement C4, Body Fluid: 26 mg/dL (ref 14–44)

## 2018-09-29 LAB — PHOSPHORUS: Phosphorus: 5.3 mg/dL — ABNORMAL HIGH (ref 2.5–4.6)

## 2018-09-29 LAB — GLOMERULAR BASEMENT MEMBRANE ANTIBODIES: GBM Ab: 6 units (ref 0–20)

## 2018-09-29 LAB — SEROTONIN RELEASE ASSAY (SRA)
SRA .2 IU/mL UFH Ser-aCnc: 99 % — ABNORMAL HIGH (ref 0–20)
SRA 100IU/mL UFH Ser-aCnc: 1 % (ref 0–20)

## 2018-09-29 LAB — C3 COMPLEMENT: C3 Complement: 114 mg/dL (ref 82–167)

## 2018-09-29 LAB — GLUCOSE, CAPILLARY
Glucose-Capillary: 84 mg/dL (ref 70–99)
Glucose-Capillary: 88 mg/dL (ref 70–99)
Glucose-Capillary: 103 mg/dL — ABNORMAL HIGH (ref 70–99)

## 2018-09-29 MED ORDER — POTASSIUM CHLORIDE 10 MEQ/100ML IV SOLN
10.0000 meq | INTRAVENOUS | Status: AC
  Administered 2018-09-29 (×2): 10 meq via INTRAVENOUS

## 2018-09-29 MED ORDER — ARGATROBAN 50 MG/50ML IV SOLN
0.1570 ug/kg/min | INTRAVENOUS | Status: DC
  Administered 2018-09-29 – 2018-09-30 (×4): 0.4 ug/kg/min via INTRAVENOUS
  Administered 2018-10-01: 0.32 ug/kg/min via INTRAVENOUS
  Administered 2018-10-02: 0.224 ug/kg/min via INTRAVENOUS
  Filled 2018-09-29 (×7): qty 50

## 2018-09-29 MED ORDER — WARFARIN SODIUM 5 MG PO TABS
5.0000 mg | ORAL_TABLET | Freq: Once | ORAL | Status: AC
  Administered 2018-09-29: 21:00:00 5 mg via ORAL
  Filled 2018-09-29: qty 1

## 2018-09-29 MED ORDER — POTASSIUM CHLORIDE 10 MEQ/100ML IV SOLN
10.0000 meq | INTRAVENOUS | Status: AC
  Administered 2018-09-29 (×2): 10 meq via INTRAVENOUS
  Filled 2018-09-29 (×2): qty 100

## 2018-09-29 MED ORDER — SODIUM CHLORIDE 0.9 % IV SOLN
1.0000 g | INTRAVENOUS | Status: DC
  Administered 2018-09-30 – 2018-10-03 (×5): 1 g via INTRAVENOUS
  Filled 2018-09-29 (×7): qty 1

## 2018-09-29 NOTE — Progress Notes (Signed)
[facility]  ROUNDING NOTE   Subjective:  Patient seen at bedside. Continues to have diminished renal function. Therefore we have decided upon dialysis for today. Urine output yesterday was 2.2 L.  Objective:  Vital signs in last 24 hours:  Temp:  [98.2 F (36.8 C)-100.3 F (37.9 C)] 99.3 F (37.4 C) (06/12 0740) Pulse Rate:  [93-101] 93 (06/12 0740) Resp:  [17-22] 22 (06/11 2319) BP: (122-161)/(80-94) 122/80 (06/12 0740) SpO2:  [94 %-98 %] 98 % (06/12 0740) Weight:  [157.1 kg] 157.1 kg (06/12 0630)  Weight change:  Filed Weights   09/24/18 1640 09/24/18 1930 09/29/18 0630  Weight: (!) 153.7 kg (!) 153.7 kg (!) 157.1 kg    Intake/Output: I/O last 3 completed shifts: In: 415.9 [I.V.:251.9; IV Piggyback:164] Out: 3000 [Urine:3000]   Intake/Output this shift:  Total I/O In: -  Out: 950 [Urine:950]  Physical Exam: General: NAD  Head: Moist oral mucosal membranes  Eyes: Anicteric   Lungs:  Scatterd rhonchi, normal effort  Heart: S1S2 no rubs  Abdomen:  Soft, nontender, obese, distended  Extremities: + peripheral edema., b/l knee dressing  Neurologic: Awake, alert, conversant  Skin: warm        Basic Metabolic Panel: Recent Labs  Lab 09/22/18 2013 09/23/18 0407 09/24/18 0327 09/25/18 0444 09/26/18 1027 09/27/18 0512 09/28/18 0817 09/29/18 0504  NA 142 144 144 142 140 138  --  138  K 3.2* 3.2* 3.1* 3.4* 2.9* 3.4*  --  3.2*  CL 107 109 105 103 102 101  --  101  CO2 22 23 28 24 25 25   --  22  GLUCOSE 222* 104* 158* 234* 100* 121*  --  94  BUN 55* 60* 56* 52* 66* 52*  --  61*  CREATININE 8.89* 9.18* 8.30* 7.57* 9.23* 8.38* 10.19* 10.82*  CALCIUM 7.9* 8.1* 7.6* 7.7* 7.7* 7.6*  --  8.1*  MG 2.2 2.2  --  1.9  --   --   --   --   PHOS 4.0 4.1 3.7 4.5 4.4  --   --   --     Liver Function Tests: Recent Labs  Lab 09/23/18 0407 09/24/18 0327 09/25/18 0444 09/26/18 1027  AST 48*  --  56*  --   ALT 28  --  37  --   ALKPHOS 59  --  64  --    BILITOT 0.5  --  0.7  --   PROT 6.4*  --  6.0*  --   ALBUMIN 1.9* 1.9* 2.0* 1.8*   No results for input(s): LIPASE, AMYLASE in the last 168 hours. Recent Labs  Lab 09/22/18 2014  AMMONIA 10    CBC: Recent Labs  Lab 09/23/18 0407  09/25/18 0444 09/26/18 0326 09/27/18 0512 09/28/18 0253 09/29/18 0504  WBC 9.9   < > 9.8 10.4 12.1* 10.4 11.2*  NEUTROABS 7.5  --   --   --   --   --   --   HGB 7.7*   < > 8.2* 8.8* 9.7* 8.9* 8.6*  HCT 23.8*   < > 25.4* 26.9* 29.7* 27.3* 26.8*  MCV 82.4   < > 83.8 83.5 83.0 82.0 81.5  PLT 102*   < > 150 227 274 373 499*   < > = values in this interval not displayed.    Cardiac Enzymes: Recent Labs  Lab 09/23/18 1207 09/26/18 0326 09/29/18 0504  CKTOTAL 100 90 89    BNP: Invalid input(s): POCBNP  CBG: Recent  Labs  Lab 09/28/18 1621 09/28/18 1640 09/28/18 2106 09/29/18 0741 09/29/18 1137  GLUCAP 109* 100* 96 84 55    Microbiology: Results for orders placed or performed during the hospital encounter of 09/12/18  Blood Culture (routine x 2)     Status: None   Collection Time: 09/12/18  9:56 AM   Specimen: BLOOD LEFT HAND  Result Value Ref Range Status   Specimen Description BLOOD LEFT HAND  Final   Special Requests   Final    BOTTLES DRAWN AEROBIC AND ANAEROBIC Blood Culture adequate volume   Culture   Final    NO GROWTH 5 DAYS Performed at [facility], [address] [place], [place] [address]    Report Status 09/17/2018 FINAL  Final  Urine culture     Status: None   Collection Time: 09/12/18 10:01 AM   Specimen: Urine, Random  Result Value Ref Range Status   Specimen Description   Final    URINE, RANDOM Performed at [facility], [address], [place], [place] [address]    Special Requests   Final    NONE Performed at [facility], [address], [place], [place] [address]    Culture   Final    NO GROWTH Performed at [facility], [place] [address] [place], [place]  [address]    Report Status 09/13/2018 FINAL  Final  SARS Coronavirus 2 (CEPHEID- Performed in [place] hospital lab), Hosp Order     Status: None   Collection Time: 09/12/18 10:02 AM   Specimen: Nasopharyngeal Swab  Result Value Ref Range Status   SARS Coronavirus 2 NEGATIVE NEGATIVE Final    Comment: (NOTE) If result is NEGATIVE SARS-CoV-2 target nucleic acids are NOT DETECTED. The SARS-CoV-2 RNA is generally detectable in upper and lower  respiratory specimens during the acute phase of infection. The lowest  concentration of SARS-CoV-2 viral copies this assay can detect is 250  copies / mL. A negative result does not preclude SARS-CoV-2 infection  and should not be used as the sole basis for treatment or other  patient management decisions.  A negative result may occur with  improper specimen collection / handling, submission of specimen other  than nasopharyngeal swab, presence of viral mutation(s) within the  areas targeted by this assay, and inadequate number of viral copies  (<250 copies / mL). A negative result must be combined with clinical  observations, patient history, and epidemiological information. If result is POSITIVE SARS-CoV-2 target nucleic acids are DETECTED. The SARS-CoV-2 RNA is generally detectable in upper and lower  respiratory specimens dur ing the acute phase of infection.  Positive  results are indicative of active infection with SARS-CoV-2.  Clinical  correlation with patient history and other diagnostic information is  necessary to determine patient infection status.  Positive results do  not rule out bacterial infection or co-infection with other viruses. If result is PRESUMPTIVE POSTIVE SARS-CoV-2 nucleic acids MAY BE PRESENT.   A presumptive positive result was obtained on the submitted specimen  and confirmed on repeat testing.  While 2019 novel coronavirus  (SARS-CoV-2) nucleic acids may be present in the submitted sample  additional confirmatory  testing may be necessary for epidemiological  and / or clinical management purposes  to differentiate between  SARS-CoV-2 and other Sarbecovirus currently known to infect humans.  If clinically indicated additional testing with an alternate test  methodology [phone]) is advised. The SARS-CoV-2 RNA is generally  detectable in upper and lower  respiratory sp ecimens during the acute  phase of infection. The expected result is Negative. Fact Sheet for Patients:  StrictlyIdeas.no Fact Sheet for Healthcare Providers: BankingDealers.co.za This test is not yet approved or cleared by the [place] FDA and has been authorized for detection and/or diagnosis of SARS-CoV-2 by FDA under an Emergency Use Authorization (EUA).  This EUA will remain in effect (meaning this test can be used) for the duration of the COVID-19 declaration under Section 564(b)(1) of the Act, 21 U.S.C. section 360bbb-3(b)(1), unless the authorization is terminated or revoked sooner. Performed at [facility], [address] [place], [place] [address]   Blood Culture (routine x 2)     Status: None   Collection Time: 09/12/18 10:06 AM   Specimen: BLOOD  Result Value Ref Range Status   Specimen Description BLOOD LEFT ANTECUBITAL  Final   Special Requests   Final    BOTTLES DRAWN AEROBIC AND ANAEROBIC Blood Culture adequate volume   Culture   Final    NO GROWTH 5 DAYS Performed at [facility], [address] [place], [place] [address]    Report Status 09/17/2018 FINAL  Final  MRSA PCR Screening     Status: None   Collection Time: 09/12/18  7:04 PM   Specimen: Nasal Mucosa; Nasopharyngeal  Result Value Ref Range Status   MRSA by PCR NEGATIVE NEGATIVE Final    Comment:        The GeneXpert MRSA Assay (FDA approved for NASAL specimens only), is one component of a comprehensive MRSA colonization surveillance program. It is not intended to diagnose  MRSA infection nor to guide or monitor treatment for MRSA infections. Performed at [facility], [address] [place], [place] [address]   C difficile quick scan w PCR reflex     Status: None   Collection Time: 09/14/18  2:21 PM   Specimen: STOOL  Result Value Ref Range Status   C Diff antigen NEGATIVE NEGATIVE Final   C Diff toxin NEGATIVE NEGATIVE Final   C Diff interpretation No C. difficile detected.  Final    Comment: Performed at [facility], [address] [place], [place] [address]  Group A Strep by PCR     Status: None   Collection Time: 09/15/18  1:53 PM  Result Value Ref Range Status   Group A Strep by PCR NOT DETECTED NOT DETECTED Final    Comment: Performed at [facility], [address], [place], [place] [address]  Body fluid culture     Status: None   Collection Time: 09/15/18  5:13 PM   Specimen: KNEE  Result Value Ref Range Status   Specimen Description   Final    KNEE Performed at [facility], [address], [place], [place] [address]    Special Requests   Final    NONE Performed at [facility], [address], [place], [place] [address]    Gram Stain   Final    RARE RBCS FEW WBC SEEN NO ORGANISMS SEEN Performed at [facility], [address], [place],  [address]    Culture   Final    NO GROWTH 3 DAYS Performed at [facility], [place] [address] [place],  [address]    Report Status 09/19/2018 FINAL  Final  Body fluid culture     Status: None   Collection Time: 09/16/18  8:55 AM   Specimen: KNEE  Result Value Ref Range Status  Specimen Description KNEE RIGHT  Final   Special Requests NONE  Final   Gram Stain   Final    RARE WBC PRESENT, PREDOMINANTLY PMN NO ORGANISMS SEEN    Culture   Final    NO GROWTH 3 DAYS Performed at [facility], [place] [address], [place], [place] [address]    Report Status 09/20/2018 FINAL  Final  Aerobic/Anaerobic Culture  (surgical/deep wound)     Status: None   Collection Time: 09/17/18 11:32 AM   Specimen: Wound  Result Value Ref Range Status   Specimen Description   Final    TISSUE LEFT KNEE 1 Performed at [facility], [address] [place], [place] [address]    Special Requests PATIENT ON FOLLOWING MAXIPIME VANCOMYCIN  Final   Gram Stain   Final    FEW WBC PRESENT,BOTH PMN AND MONONUCLEAR NO ORGANISMS SEEN    Culture   Final    No growth aerobically or anaerobically. Performed at [facility], [place] [address], [place], [place] [address]    Report Status 09/22/2018 FINAL  Final  Acid Fast Smear (AFB)     Status: None   Collection Time: 09/17/18 11:32 AM   Specimen: Synovium  Result Value Ref Range Status   AFB Specimen Processing Comment  Final    Comment: Tissue Grinding and Digestion/Decontamination   Acid Fast Smear Negative  Final    Comment: (NOTE) Performed At: [facility] [place], [place] [id number] Rush Farmer MD SA:[id number]    Source (AFB) TISSUE  Final    Comment: LEFT KNEE Performed at [facility], [place] [address], [place], [place] [address]   Culture, fungus without smear     Status: None (Preliminary result)   Collection Time: 09/17/18 11:34 AM   Specimen: Synovium; Other  Result Value Ref Range Status   Specimen Description   Final    SYNOVIAL Performed at [facility], [address], [place], [place] [address]    Special Requests   Final    NONE Performed at [facility], [address], [place], [place] [address]    Culture   Final    NO FUNGUS ISOLATED AFTER 9 DAYS Performed at [facility], [place] [address], [place], [place] [address]    Report Status PENDING  Incomplete  Acid Fast Culture with reflexed sensitivities     Status: None   Collection Time: 09/17/18 11:36 AM   Specimen: Synovium  Result Value Ref Range Status   Acid Fast Culture CANC  Final    Comment: (NOTE) Test  cancelled at client's request.      Cancelled per Levin Erp 09/21/2018 Performed At: [facility] [place], [place] [id number] Rush Farmer MD YW:[id number]    Source of Sample TISSUE  Final    Comment: RIGHT KNEE Performed at [facility], [place] [address] [place], [place] [address]   Aerobic/Anaerobic Culture (surgical/deep wound)     Status: None   Collection Time: 09/17/18 11:43 AM   Specimen: Wound  Result Value Ref Range Status   Specimen Description   Final    TISSUE LEFT KNEE 2 Performed at [facility], [address] [place], [place] [address]    Special Requests PATIENT ON FOLLOWING MAXIPIME VANCOMYCIN  Final   Gram Stain   Final    FEW WBC PRESENT, PREDOMINANTLY PMN NO ORGANISMS SEEN    Culture   Final  No growth aerobically or anaerobically. Performed at [facility], [place] [address], [place], [place] [address]    Report Status 09/22/2018 FINAL  Final  Aerobic/Anaerobic Culture (surgical/deep wound)     Status: None   Collection Time: 09/17/18 11:44 AM   Specimen: Wound  Result Value Ref Range Status   Specimen Description   Final    TISSUE LEFT KNEE 3 Performed at [facility], [address] [place], [place] [address]    Special Requests PATIENT ON FOLLOWING MAXIPIME VANCOMYCIN  Final   Gram Stain   Final    MODERATE WBC PRESENT, PREDOMINANTLY PMN NO ORGANISMS SEEN    Culture   Final    No growth aerobically or anaerobically. Performed at [facility], [place] [address] [place], [place] [address]    Report Status 09/22/2018 FINAL  Final  Aerobic/Anaerobic Culture (surgical/deep wound)     Status: None   Collection Time: 09/18/18  9:43 PM   Specimen: [facility] Other; Tissue  Result Value Ref Range Status   Specimen Description   Final    SYNOVIAL Performed at [facility], [address] [place], [place] [address]    Special Requests   Final    NONE Performed at [facility], [address] [place], [place] [address]    Gram Stain   Final    RARE WBC PRESENT, PREDOMINANTLY MONONUCLEAR NO ORGANISMS SEEN    Culture   Final    No growth aerobically or anaerobically. Performed at [facility], [place] [address] [place], [place] [address]    Report Status 09/24/2018 FINAL  Final  Aerobic/Anaerobic Culture (surgical/deep wound)     Status: None   Collection Time: 09/18/18  9:44 PM   Specimen: [facility] Other; Tissue  Result Value Ref Range Status   Specimen Description   Final    SYNOVIAL Performed at [facility], [address] [place], [place] [address]    Special Requests   Final    RIGHT KNEE Performed at [facility], [place], [place] [address]    Gram Stain   Final    FEW WBC PRESENT,BOTH PMN AND MONONUCLEAR NO ORGANISMS SEEN    Culture   Final    No growth aerobically or anaerobically. Performed at [facility], [place] [address] [place], [place] [address]    Report Status 09/24/2018 FINAL  Final  Aerobic/Anaerobic Culture (surgical/deep wound)     Status: None   Collection Time: 09/18/18  9:45 PM   Specimen: [facility] Other; Tissue  Result Value Ref Range Status   Specimen Description   Final    SYNOVIAL Performed at [facility], [address] [place], [place] [address]    Special Requests   Final    RIGHT KNEE Performed at [facility], [address] [place], [place] [address]    Gram Stain   Final    FEW WBC PRESENT, PREDOMINANTLY MONONUCLEAR NO ORGANISMS SEEN    Culture   Final    No growth aerobically or anaerobically. Performed at [facility], [place] [address] [place], [place] [address]    Report Status 09/24/2018 FINAL  Final  Culture, blood (Routine X 2) w Reflex to ID Panel     Status: None   Collection Time: 09/19/18  1:11 PM   Specimen: BLOOD  Result Value Ref Range Status   Specimen Description BLOOD LEFT ANTECUBITAL  Final  Special  Requests   Final    BOTTLES DRAWN AEROBIC AND ANAEROBIC Blood Culture results may not be optimal due to an excessive volume of blood received in culture bottles   Culture   Final    NO GROWTH 5 DAYS Performed at [facility], [address] [place], [place] [address]    Report Status 09/24/2018 FINAL  Final  Culture, blood (Routine X 2) w Reflex to ID Panel     Status: None   Collection Time: 09/19/18  1:20 PM   Specimen: BLOOD  Result Value Ref Range Status   Specimen Description BLOOD BLOOD RIGHT HAND  Final   Special Requests   Final    BOTTLES DRAWN AEROBIC AND ANAEROBIC Blood Culture results may not be optimal due to an excessive volume of blood received in culture bottles   Culture   Final    NO GROWTH 5 DAYS Performed at [facility], [address], [place], [place] [address]    Report Status 09/24/2018 FINAL  Final  Urine Culture     Status: None   Collection Time: 09/20/18  3:59 PM   Specimen: Urine, Random  Result Value Ref Range Status   Specimen Description   Final    URINE, RANDOM Performed at [facility], [address], [place], [place] [address]    Special Requests   Final    NONE Performed at [facility], [address] [place], [place] [address]    Culture   Final    NO GROWTH Performed at [facility], [place] [address], [place], [place] [address]    Report Status 09/21/2018 FINAL  Final  C difficile quick scan w PCR reflex     Status: None   Collection Time: 09/21/18  3:27 PM   Specimen: STOOL  Result Value Ref Range Status   C Diff antigen NEGATIVE NEGATIVE Final   C Diff toxin NEGATIVE NEGATIVE Final   C Diff interpretation No C. difficile detected.  Final    Comment: Performed at [facility], [address] [place],  [address]  Gastrointestinal Panel by PCR , Stool     Status: None   Collection Time: 09/21/18  3:27 PM   Specimen: Stool  Result Value Ref Range Status    Campylobacter species NOT DETECTED NOT DETECTED Final   Plesimonas shigelloides NOT DETECTED NOT DETECTED Final   Salmonella species NOT DETECTED NOT DETECTED Final   Yersinia enterocolitica NOT DETECTED NOT DETECTED Final   Vibrio species NOT DETECTED NOT DETECTED Final   Vibrio cholerae NOT DETECTED NOT DETECTED Final   Enteroaggregative E coli (EAEC) NOT DETECTED NOT DETECTED Final   Enteropathogenic E coli (EPEC) NOT DETECTED NOT DETECTED Final   Enterotoxigenic E coli (ETEC) NOT DETECTED NOT DETECTED Final   Shiga like toxin producing E coli (STEC) NOT DETECTED NOT DETECTED Final   Shigella/Enteroinvasive E coli (EIEC) NOT DETECTED NOT DETECTED Final   Cryptosporidium NOT DETECTED NOT DETECTED Final   Cyclospora cayetanensis NOT DETECTED NOT DETECTED Final   Entamoeba histolytica NOT DETECTED NOT DETECTED Final   Giardia lamblia NOT DETECTED NOT DETECTED Final   Adenovirus F40/41 NOT DETECTED NOT DETECTED Final   Astrovirus NOT DETECTED NOT DETECTED Final   Norovirus GI/GII NOT DETECTED NOT DETECTED Final   Rotavirus A NOT DETECTED NOT DETECTED Final   Sapovirus (I, II, IV, and V) NOT DETECTED NOT DETECTED Final    Comment: Performed at [facility], [address] [place], [place] [address]    Coagulation Studies: Recent Labs    09/26/18 1508 09/27/18 0512 09/28/18 0253 09/29/18 0504  LABPROT 22.3* 24.2* 27.5* 18.7*  INR 2.0* 2.2* 2.6* 1.6*    Urinalysis: No results for input(s): COLORURINE, LABSPEC, PHURINE, GLUCOSEU, HGBUR, BILIRUBINUR, KETONESUR, PROTEINUR, UROBILINOGEN, NITRITE, LEUKOCYTESUR in the last 72 hours.  Invalid input(s): APPERANCEUR    Imaging: No results found.   Medications:   . sodium chloride 5 mL/hr at 09/29/18 0242  . sodium chloride    . anticoagulant sodium citrate    . argatroban 0.4 mcg/kg/min (09/29/18 0825)  . cefTAZidime (FORTAZ)  IV    . DAPTOmycin (CUBICIN)  IV Stopped (09/27/18 2126)   . aspirin EC  81 mg Oral Daily   . atenolol  25 mg Oral Daily  . budesonide (PULMICORT) nebulizer solution  0.5 mg Nebulization BID  . Chlorhexidine Gluconate Cloth  6 each Topical Q0600  . citalopram  20 mg Oral Daily  . feeding supplement (NEPRO CARB STEADY)  237 mL Oral BID BM  . fluticasone furoate-vilanterol  1 puff Inhalation Daily  . folic acid  1 mg Oral Daily  . insulin aspart  0-5 Units Subcutaneous QHS  . insulin aspart  0-9 Units Subcutaneous TID WC  . insulin glargine  10 Units Subcutaneous Daily  . mouth rinse  15 mL Mouth Rinse BID  . multivitamin  1 tablet Oral Q1200  . multivitamin with minerals  1 tablet Oral Daily  . omega-3 acid ethyl esters  1 g Oral Daily  . rosuvastatin  20 mg Oral Daily  . sodium chloride flush  10-40 mL Intracatheter Q12H  . sodium chloride flush  3 mL Intravenous Q12H  . tuberculin  5 Units Intradermal Once  . Warfarin - Pharmacist Dosing Inpatient   Does not apply q1800   sodium chloride, sodium chloride, acetaminophen **OR** acetaminophen, albuterol, alum & mag hydroxide-simeth, anticoagulant sodium citrate, fentaNYL (SUBLIMAZE) injection, HYDROcodone-acetaminophen, iohexol, ipratropium-albuterol, menthol-cetylpyridinium **OR** phenol, menthol-cetylpyridinium **OR** phenol, metoCLOPramide **OR** metoCLOPramide (REGLAN) injection, ondansetron **OR** ondansetron (ZOFRAN) IV, sodium chloride flush, sodium chloride flush  Assessment/ Plan:  Isaac Cross is a 58 y.o. black male with hyeprtension, COPD, diabetes mellitus type II who underwent bilateral knee replacement surgeries last week. who was admitted to [facility] on 09/12/2018 for sepsis  1. Acute renal failure with metabolic acidosis  baseline creatinine of 1.07 with normal GFR in 06/12/18.  Likely severe ATN from hypotension and sepsis -Patient continues to have significant azotemia.  Creatinine greater than 10.  Therefore we will plan for another dialysis session.  Patient is making adequate amounts of urine in May 2.2 L  over the preceding 24 hours.  We will continue to monitor this..  2. Septic shock/hypotension  -Recent bilateral knee replacement on Sep 01, 2018 [facility]  -  knee septic arthritis, patient underwent I&D and polyethylene exchange on 5/31 and Rt knee on 6/1 -abx management per ID/hospitalist.   3. Hypokalemia -Administer potassium chloride 40 mEq IV x1 today.       LOS: 17 Isaac Cross 6/12/20201:27 PM

## 2018-09-29 NOTE — Consult Note (Addendum)
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  Pharmacy also consulted for Warfarin dosing/management for DVT with argatroban bridge  Patient Measurements: Height: 6\' 4"  (193 cm) Weight: (una) IBW/kg (Calculated) : 86.8  Vital Signs: Temp: 100.3 F (37.9 C) (06/11 2319) Temp Source: Axillary (06/11 2319) BP: 151/83 (06/11 2319) Pulse Rate: 94 (06/11 2319)  Labs: Recent Labs    09/26/18 1027  09/27/18 0512 09/28/18 0253 09/28/18 0817 09/29/18 0504  HGB  --    < > 9.7* 8.9*  --  8.6*  HCT  --   --  29.7* 27.3*  --  26.8*  PLT  --   --  274 373  --  499*  APTT  --    < > 71* 88* 80* 49*  LABPROT  --    < > 24.2* 27.5*  --  18.7*  INR  --    < > 2.2* 2.6*  --  1.6*  CREATININE 9.23*  --  8.38*  --  10.19*  --    < > = values in this interval not displayed.    Estimated Creatinine Clearance: 12.4 mL/min (A) (by C-G formula based on SCr of 10.19 mg/dL (H)).   Medical History: Past Medical History:  Diagnosis Date  . Allergy   . Anxiety   . Arthritis    knees  . Asthma, mild intermittent, well-controlled   . Chronic obstructive asthma ([place])   . COPD (chronic obstructive pulmonary disease) ([place])   . Depression with anxiety   . Diabetes ([place])   . GERD (gastroesophageal reflux disease)   . HBP (high blood pressure)   . High cholesterol   . Renal cyst, right   . Sleep apnea    CPAP  . Swelling     Medications:  Scheduled:  . aspirin EC  81 mg Oral Daily  . atenolol  25 mg Oral Daily  . budesonide (PULMICORT) nebulizer solution  0.5 mg Nebulization BID  . Chlorhexidine Gluconate Cloth  6 each Topical Q0600  . citalopram  20 mg Oral Daily  . feeding supplement (NEPRO CARB STEADY)  237 mL Oral BID BM  . fluticasone furoate-vilanterol  1 puff Inhalation Daily  . folic acid  1 mg Oral Daily  . insulin aspart  0-5 Units Subcutaneous QHS  . insulin aspart  0-9 Units Subcutaneous TID WC  . insulin glargine  10 Units Subcutaneous  Daily  . mouth rinse  15 mL Mouth Rinse BID  . multivitamin  1 tablet Oral Q1200  . multivitamin with minerals  1 tablet Oral Daily  . omega-3 acid ethyl esters  1 g Oral Daily  . rosuvastatin  20 mg Oral Daily  . sodium chloride flush  10-40 mL Intracatheter Q12H  . sodium chloride flush  3 mL Intravenous Q12H  . tuberculin  5 Units Intradermal Once  . Warfarin - Pharmacist Dosing Inpatient   Does not apply q1800    Assessment: 58 yr male had B/l knee replacements Sep 01, 2018 was admitted with fever, AKI/ Platelet count has been declining since 6/5. He was started on heparin 6/3. 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  Will F/U on 1st shift if this needs to be restarted   Addendum - Order was stopped prematurely (~8am) - will restart now and check APTT in 6 hours  Stop date for argatroban 6/14 per cardiology recommendation.   WARFARIN: INR 1.6. Level is subtherapeutic. Change in INR  due to argatroban being Dcd.  Patient has received 3 doses of warfarin.  Will continue with Warfarin 5mg  dose this evening x 1 dose  Hgb trending down 9.7>> 8.9>> 8.6- continue to monitor.   Recheck aPTT/INR w/ am labs  Pernell Dupre, PharmD, BCPS Clinical Pharmacist 09/29/2018 6:02  AM   Lu Duffel, PharmD, BCPS Clinical Pharmacist 09/29/2018 7:38 AM

## 2018-09-29 NOTE — Progress Notes (Signed)
Pre HD TX   09/29/18 1445  Vital Signs  Temp 98.9 F (37.2 C)  Temp Source Oral  Pulse Rate 95  Pulse Rate Source Monitor  Resp 18  BP (!) 149/75  BP Location Left Wrist  BP Method Manual  Patient Position (if appropriate) Lying  Oxygen Therapy  SpO2 100 %  O2 Device Room Air  Pain Assessment  Pain Scale 0-10  Pain Score 0  Dialysis Weight  Weight (!) 157 kg  Type of Weight Pre-Dialysis  Time-Out for Hemodialysis  What Procedure? HD   Pt Identifiers(min of two) First/Last Name;MRN/Account#  Correct Site? Yes  Correct Side? Yes  Correct Procedure? Yes  Consents Verified? Yes  Rad Studies Available? N/A  Safety Precautions Reviewed? Yes  Engineer, civil (consulting) Number 1  Station Number 2  UF/Alarm Test Passed  Conductivity: Meter 14  Conductivity: Machine  14  pH 7.4  Reverse Osmosis Main  Normal Saline Lot Number [id number]  Dialyzer Lot Number [id number]  Disposable Set Lot Number [id number]  Machine Temperature 98.6 F (37 C)  Musician and Audible Yes  Blood Lines Intact and Secured Yes  Pre Treatment Patient Checks  Vascular access used during treatment Catheter  Hepatitis B Surface Antigen Results Negative  Date Hepatitis B Surface Antigen Drawn 09/21/18  Hepatitis B Surface Antibody  (<10)  Date Hepatitis B Surface Antibody Drawn 09/21/18  Hemodialysis Consent Verified Yes  Hemodialysis Standing Orders Initiated Yes  ECG (Telemetry) Monitor On Yes  Prime Ordered Normal Saline  Length of  DialysisTreatment -hour(s) 3.5 Hour(s)  Dialysis Treatment Comments Na 140  Dialyzer Elisio 17H NR  Dialysate 4K  Dialysis Anticoagulant None  Dialysate Flow Ordered 800  Blood Flow Rate Ordered 400 mL/min  Ultrafiltration Goal 1 Liters  Pre Treatment Labs Phosphorus  Dialysis Blood Pressure Support Ordered Normal Saline  Education / Care Plan  Dialysis Education Provided Yes  Documented Education in Care Plan Yes  Hemodialysis Catheter Left Internal jugular  Triple lumen Temporary  Placement Date/Time: 09/21/18 1100   Placed prior to admission: No  Time Out: Correct patient;Correct procedure;Correct site  Maximum sterile barrier precautions: Hand hygiene;Sterile gown;Cap;Sterile gloves;Large sterile sheet;Mask  Site Prep: Chlorh...  Site Condition No complications  Blue Lumen Status Blood return noted  Red Lumen Status Blood return noted  Purple Lumen Status Infusing  Dressing Type Biopatch;Occlusive  Dressing Status Clean;Dry;Dressing reinforced  Interventions Dressing reinforced

## 2018-09-29 NOTE — Progress Notes (Signed)
Subjective:  Patient reports pain as mild.    Objective:   VITALS:   Vitals:   09/28/18 2014 09/28/18 2319 09/29/18 0630 09/29/18 0740  BP:  (!) 151/83  122/80  Pulse:  94  93  Resp:  (!) 22    Temp:  100.3 F (37.9 C)  99.3 F (37.4 C)  TempSrc:  Axillary  Oral  SpO2: 96% 98%  98%  Weight:   (!) 157.1 kg   Height:        PHYSICAL EXAM:  Neurologically intact ABD soft Neurovascular intact Sensation intact distally Intact pulses distally Dorsiflexion/Plantar flexion intact Incision: no drainage No cellulitis present Compartment soft  LABS  Results for orders placed or performed during the hospital encounter of 09/12/18 (from the past 24 hour(s))  Glucose, capillary     Status: None   Collection Time: 09/28/18  8:11 AM  Result Value Ref Range   Glucose-Capillary 76 70 - 99 mg/dL   Comment 1 Notify RN   APTT     Status: Abnormal   Collection Time: 09/28/18  8:17 AM  Result Value Ref Range   aPTT 80 (H) 24 - 36 seconds  Creatinine, serum     Status: Abnormal   Collection Time: 09/28/18  8:17 AM  Result Value Ref Range   Creatinine, Ser 10.19 (H) 0.61 - 1.24 mg/dL   GFR calc non Af Amer 5 (L) >60 mL/min   GFR calc Af Amer 6 (L) >60 mL/min  Glucose, capillary     Status: Abnormal   Collection Time: 09/28/18  9:54 AM  Result Value Ref Range   Glucose-Capillary 118 (H) 70 - 99 mg/dL   Comment 1 Notify RN   Glucose, capillary     Status: Abnormal   Collection Time: 09/28/18 11:54 AM  Result Value Ref Range   Glucose-Capillary 141 (H) 70 - 99 mg/dL   Comment 1 Notify RN   Glucose, capillary     Status: Abnormal   Collection Time: 09/28/18  4:21 PM  Result Value Ref Range   Glucose-Capillary 109 (H) 70 - 99 mg/dL  C3 complement     Status: None   Collection Time: 09/28/18  4:30 PM  Result Value Ref Range   C3 Complement 114 82 - 167 mg/dL  C4 complement     Status: None   Collection Time: 09/28/18  4:30 PM  Result Value Ref Range   Complement C4, Body  Fluid 26 14 - 44 mg/dL  Glucose, capillary     Status: Abnormal   Collection Time: 09/28/18  4:40 PM  Result Value Ref Range   Glucose-Capillary 100 (H) 70 - 99 mg/dL  Protein / creatinine ratio, urine     Status: Abnormal   Collection Time: 09/28/18  5:19 PM  Result Value Ref Range   Creatinine, Urine 48 mg/dL   Total Protein, Urine 46 mg/dL   Protein Creatinine Ratio 0.96 (H) 0.00 - 0.15 mg/mg[Cre]  Glucose, capillary     Status: None   Collection Time: 09/28/18  9:06 PM  Result Value Ref Range   Glucose-Capillary 96 70 - 99 mg/dL  Protime-INR     Status: Abnormal   Collection Time: 09/29/18  5:04 AM  Result Value Ref Range   Prothrombin Time 18.7 (H) 11.4 - 15.2 seconds   INR 1.6 (H) 0.8 - 1.2  CK     Status: None   Collection Time: 09/29/18  5:04 AM  Result Value Ref Range   Total CK 89  49 - 397 U/L  CBC     Status: Abnormal   Collection Time: 09/29/18  5:04 AM  Result Value Ref Range   WBC 11.2 (H) 4.0 - 10.5 K/uL   RBC 3.29 (L) 4.22 - 5.81 MIL/uL   Hemoglobin 8.6 (L) 13.0 - 17.0 g/dL   HCT 26.8 (L) 39.0 - 52.0 %   MCV 81.5 80.0 - 100.0 fL   MCH 26.1 26.0 - 34.0 pg   MCHC 32.1 30.0 - 36.0 g/dL   RDW 15.3 11.5 - 15.5 %   Platelets 499 (H) 150 - 400 K/uL   nRBC 0.0 0.0 - 0.2 %  APTT     Status: Abnormal   Collection Time: 09/29/18  5:04 AM  Result Value Ref Range   aPTT 49 (H) 24 - 36 seconds  Basic metabolic panel     Status: Abnormal   Collection Time: 09/29/18  5:04 AM  Result Value Ref Range   Sodium 138 135 - 145 mmol/L   Potassium 3.2 (L) 3.5 - 5.1 mmol/L   Chloride 101 98 - 111 mmol/L   CO2 22 22 - 32 mmol/L   Glucose, Bld 94 70 - 99 mg/dL   BUN 61 (H) 6 - 20 mg/dL   Creatinine, Ser 10.82 (H) 0.61 - 1.24 mg/dL   Calcium 8.1 (L) 8.9 - 10.3 mg/dL   GFR calc non Af Amer 5 (L) >60 mL/min   GFR calc Af Amer 5 (L) >60 mL/min   Anion gap 15 5 - 15  Glucose, capillary     Status: None   Collection Time: 09/29/18  7:41 AM  Result Value Ref Range    Glucose-Capillary 84 70 - 99 mg/dL    No results found.  Assessment/Plan: 11 Days Post-Op   Active Problems:   Sepsis ([place])   Acute renal failure ([place])   Septic shock ([place])   Advance diet Up with therapy  Discharge planning Will remove staples in 1 week  Carlynn Spry , PA-C 09/29/2018, 8:10 AM

## 2018-09-29 NOTE — Progress Notes (Signed)
[facility] [place] NAME: Isaac Cross    MR#:  [id number]  DATE OF BIRTH:  [birth year]  SUBJECTIVE:   - more alert and conversive. -Renal function worsening although making urine--HD today  REVIEW OF SYSTEMS:  Review of Systems  Constitutional: Negative for chills, fever and weight loss.  HENT: Negative for ear discharge, ear pain and nosebleeds.   Eyes: Negative for blurred vision, pain and discharge.  Respiratory: Negative for sputum production, shortness of breath, wheezing and stridor.   Cardiovascular: Negative for chest pain, palpitations, orthopnea and PND.  Gastrointestinal: Negative for abdominal pain, diarrhea, nausea and vomiting.  Genitourinary: Negative for frequency and urgency.  Musculoskeletal: Negative for back pain and joint pain.  Neurological: Positive for weakness. Negative for sensory change, speech change and focal weakness.  Psychiatric/Behavioral: Negative for depression and hallucinations. The patient is not nervous/anxious.    DRUG ALLERGIES:   Allergies  Allergen Reactions  . Heparin     Heparin antibody positive; SRA pending  . Lipitor [Atorvastatin] Hives and Itching  . Other   . Viagra  [Sildenafil Citrate]     vision loss    VITALS:  Blood pressure 122/80, pulse 93, temperature 99.3 F (37.4 C), temperature source Oral, resp. rate (!) 22, height 6\' 4"  (1.93 m), weight (!) 157.1 kg, SpO2 98 %.  PHYSICAL EXAMINATION:  Physical Exam   GENERAL:  58 y.o.-year-old patient lying in the bed with no acute distress. Critically ill appearing EYES: Pupils equal, round, reactive to light and accommodation. No scleral icterus.  HEENT: Head atraumatic, normocephalic. Oropharynx and nasopharynx clear.  NECK:  Supple, no jugular venous distention. No thyroid enlargement, no tenderness.  LUNGS: Normal breath sounds bilaterally, no wheezing, rales,rhonchi or crepitation. No use of accessory muscles of  respiration.  Decreased bibasilar breath sounds CARDIOVASCULAR: S1, S2 normal. No murmurs, rubs, or gallops. left IJ dialysis catheter  ABDOMEN: Soft, nontender, nondistended. Bowel sounds present. No organomegaly or mass. Foley+ EXTREMITIES: Swelling of both legs, knees noted with recent surgery.  No  cyanosis, or clubbing.  Both hands are swollen as well. NEUROLOGIC: Cranial nerves II through XII are intact. Moving all extremities well PSYCHIATRIC: patient is alert and oriented x2 SKIN: No obvious rash, lesion, or ulcer.    LABORATORY PANEL:   CBC Recent Labs  Lab 09/29/18 0504  WBC 11.2*  HGB 8.6*  HCT 26.8*  PLT 499*   ------------------------------------------------------------------------------------------------------------------  Chemistries  Recent Labs  Lab 09/25/18 0444  09/29/18 0504  NA 142   < > 138  K 3.4*   < > 3.2*  CL 103   < > 101  CO2 24   < > 22  GLUCOSE 234*   < > 94  BUN 52*   < > 61*  CREATININE 7.57*   < > 10.82*  CALCIUM 7.7*   < > 8.1*  MG 1.9  --   --   AST 56*  --   --   ALT 37  --   --   ALKPHOS 64  --   --   BILITOT 0.7  --   --    < > = values in this interval not displayed.   ------------------------------------------------------------------------------------------------------------------  Cardiac Enzymes No results for input(s): TROPONINI in the last 168 hours. ------------------------------------------------------------------------------------------------------------------  RADIOLOGY:  No results found.  EKG:   Orders placed or performed during the hospital encounter of 09/12/18  . EKG 12-Lead  . EKG 12-Lead  ASSESSMENT AND PLAN:    Isaac Cross a58 y.o.malewith a known history of arthritis, COPD not on home oxygen, hypertension, sleep apnea, non-insulin-dependent diabetes mellitus presents to hospital secondary to dizziness, weakness and a fall  1.Sepsis -s/p Recent bilateral knee replacement surgeries as  outpatient and now bilateral knee washouts done this adm. - fluid Cultures are negative so far. - Fungal cultures negative -Appreciate ID consult.   -Patient was on zosyn, doxycycline --now on ceftazidime and daptomycin (last dose 10/31/2018) -completed 7 days of anidulafungin    2. Acute non- Oliguric renal failure-likely ATN from sepsis and hypotension - appreciate nephrology input. -Hemodialysis started 09/22/2018 -Hold lisinopril and metformin and other nephrotoxins.    -pt has started making urine!! HD today due to creat 10.0 -creatinine increasing  3. Acute encephalopathy-secondary to metabolic causes, sepsis and uremia. -No focal deficits -improving mentation  4. Left popliteal vein DVT - was on heparin drip--platelets dropping--IV argatroban--plt count normal--added po coumadin--needs 5 days of bridiging--pharmacy handling it -platelet count now normal -HIT antibody 1.89 -appreciate hematology input with dr Janese Banks -INR 2.6  5. Diabetes mellitus-2 - on lantus and cont ssi  6. DVT prophylaxis- argatroban drip and coumadin  Physical therapy evaluation noted--pt is now agreeable for rehab  Spoke with dater valerie wife yday and updated  CODE STATUS: Full code  TOTAL TIME TAKING CARE OF THIS PATIENT: 28 minutes.   POSSIBLE D/C IN ?? DAYS, DEPENDING ON CLINICAL CONDITION.   Fritzi Mandes M.D on 09/29/2018 at 2:08 PM  Between 7am to 6pm - Pager - [phone] After 6pm go to [email or web address] - password [email or web address] [facility]  Office  [phone]  CC: Primary care physician; Steele Sizer, MD

## 2018-09-29 NOTE — Progress Notes (Signed)
Post HD Assessment    09/29/18 1839  Neurological  Level of Consciousness Alert  Orientation Level Oriented X4  Respiratory  Respiratory Pattern Regular;Unlabored  Chest Assessment Chest expansion symmetrical  Bilateral Breath Sounds Clear;Diminished  Cardiac  Pulse Regular  Heart Sounds S1, S2  ECG Monitor Yes  Cardiac Rhythm NSR  Vascular  R Radial Pulse +2  L Radial Pulse +2  Edema Generalized  Psychosocial  Psychosocial (WDL) WDL

## 2018-09-29 NOTE — Progress Notes (Signed)
Date of Admission:  09/12/2018    was confused yesterday Restarted dialysis today   Medications:  . aspirin EC  81 mg Oral Daily  . atenolol  25 mg Oral Daily  . budesonide (PULMICORT) nebulizer solution  0.5 mg Nebulization BID  . Chlorhexidine Gluconate Cloth  6 each Topical Q0600  . citalopram  20 mg Oral Daily  . feeding supplement (NEPRO CARB STEADY)  237 mL Oral BID BM  . fluticasone furoate-vilanterol  1 puff Inhalation Daily  . folic acid  1 mg Oral Daily  . insulin aspart  0-5 Units Subcutaneous QHS  . insulin aspart  0-9 Units Subcutaneous TID WC  . insulin glargine  10 Units Subcutaneous Daily  . mouth rinse  15 mL Mouth Rinse BID  . multivitamin  1 tablet Oral Q1200  . multivitamin with minerals  1 tablet Oral Daily  . omega-3 acid ethyl esters  1 g Oral Daily  . rosuvastatin  20 mg Oral Daily  . sodium chloride flush  10-40 mL Intracatheter Q12H  . sodium chloride flush  3 mL Intravenous Q12H  . tuberculin  5 Units Intradermal Once  . Warfarin - Pharmacist Dosing Inpatient   Does not apply q1800    Objective: Vital signs in last 24 hours: Temp:  [98.2 F (36.8 C)-100.3 F (37.9 C)] 99.3 F (37.4 C) (06/12 0740) Pulse Rate:  [93-101] 93 (06/12 0740) Resp:  [17-22] 22 (06/11 2319) BP: (122-161)/(80-94) 122/80 (06/12 0740) SpO2:  [94 %-98 %] 98 % (06/12 0740) Weight:  [157.1 kg] 157.1 kg (06/12 0630)  Getting dialysis  creatinine Lab Results   Recent Labs    09/27/18 0512 09/28/18 0253 09/28/18 0817 09/29/18 0504  WBC 12.1* 10.4  --  11.2*  HGB 9.7* 8.9*  --  8.6*  HCT 29.7* 27.3*  --  26.8*  NA 138  --   --  138  K 3.4*  --   --  3.2*  CL 101  --   --  101  CO2 25  --   --  22  BUN 52*  --   --  61*  CREATININE 8.38*  --  10.19* 10.82*   CK 89 Liver Panel Sedimentation Rate Recent Labs    09/28/18 0253  ESRSEDRATE 69*   C-Reactive Protein Recent Labs    09/28/18 0253  CRP 19.1*     Assessment/Plan: 58 yr male had B/l knee  replacements Sep 01, 2018 was admitted with fever, AKI . He was taking bactrim and keflex when he came to the Ed on 09/12/18  Initially feverswere persisting inspite of IV vanco and cefepime and as there was no other source (Blood culture neg, urine culture neg, no pneumonia, ) the left knee was aspirated and it had 8000 wbc and he was taken for a wash out on 09/17/18. Rt knee was washed out on 09/18/18 All cultures neg Pts fever was down for 36-48 hrs and started up again on 09/19/18 at 103. Central line, femoral line were removed Cefepime was changed to zosyn on 6/2  Also his creatinine which on admission was 3.71, then 4.3 was down to 2.46 on 5/31 and then started climbing up again and today is 5.98. Vanco was discontinued on 09/19/18 ( when it was 3.42)  Fever- no fever in 4 days PJI of the left knee -and he had wash out and cultures neg No UTI, no bacteremia, no pneumonia SuspectedCandidemia--added anidulafungin6/3and doxy 6/3- - he also was started on  stress dose steroids on the same day and was also diagnosed with left popliteal DVT on 09/20/18 and started on heparin- So not sure which impacted the fever. Asfungal blood culture neg  DC anidulafungin day 7  All cultures neg, even 16S RNA NGS negative- will need 4 weeks of IV ceftazidime  + daptomycin(6mg /kg dose) on discharge until 10/31/18  Adjusted to crcl    Encephalopathy resolved ? DT? Drug reaction, compounded by worsening renal function resolved  CPK has normalized  septic shock Hypotension -resolved  AKI- started dialysison 09/22/18- - was stopped on 6/9 restarted today  ThrombocytopeniaPlatelet countwas Declining since 09/22/18-HIT positive, changed to  argatroban and platelet improving.  Anemia  ID will follow peripherally this weekend. Call if needed

## 2018-09-29 NOTE — Progress Notes (Signed)
PT Cancellation Note  Patient Details Name: Isaac Cross MRN: [id number] DOB: 08/06/1960   Cancelled Treatment:    Reason Eval/Treat Not Completed: Patient at procedure or test/unavailable(Pt off floor for HD. Noted worsening renal function. Will resume services at later date/time.)  3:08 PM, 09/29/18 Etta Grandchild, PT, DPT Physical Therapist - [facility]  [phone] ([place])    Gordie Crumby C 09/29/2018, 3:08 PM

## 2018-09-29 NOTE — Progress Notes (Signed)
Pharmacy Antibiotic Note  Isaac Cross is a 58 y.o. male admitted on 09/12/2018 with sepsis.  Pharmacy has been consulted for Daptomycin dosing.  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  He underwent I&D of both knees.  Cultures remain unrevealing but had received antibiotics prior to surgery.  He developed AKI and is currently requiring HD but Nephrology is hopeful that his kidney function is returned and may not need further HD (UOP improved).    Day #18 antibiotics/Day #3 daptomyicn/ceftazidime  (s/p 7 days of eraxis) - renal fx worse this am - CK this am = 89 - plan for HD today  Plan:  Ceftazidime increased to 1gm IV q24h by ID.  probably OK due to large size but will need to follow plans for HD  Continue Daptomycin 6mg /kg (per Adj BW for BMI >40) 700mg  IV q48h  Check CK level in 3  days since had recent CK elevation.  His CK 6/9 was WNL at 90 and was stable today  Getting HD today, 6/12 - both doses scheduled today after HD  Need to monitor renal function closely as his kidneys function appears to be returning.  If does need a random HD then may require supplemental dose of daptomycin  Monitor HD plans as per nephrology  Height: 6\' 4"  (193 cm) Weight: (!) 346 lb 4.8 oz (157.1 kg) IBW/kg (Calculated) : 86.8  Temp (24hrs), Avg:99.3 F (37.4 C), Min:98.2 F (36.8 C), Max:100.3 F (37.9 C)  Recent Labs  Lab 09/25/18 0444 09/26/18 0326 09/26/18 1027 09/27/18 0512 09/28/18 0253 09/28/18 0817 09/29/18 0504  WBC 9.8 10.4  --  12.1* 10.4  --  11.2*  CREATININE 7.57*  --  9.23* 8.38*  --  10.19* 10.82*    Estimated Creatinine Clearance: 11.8 mL/min (A) (by C-G formula based on SCr of 10.82 mg/dL (H)).    Allergies  Allergen Reactions  . Heparin     Heparin antibody positive; SRA pending  . Lipitor [Atorvastatin] Hives and Itching  . Other   . Viagra  [Sildenafil Citrate]     vision loss     Thank you for allowing pharmacy to be a part of this patient's  care.  Doreene Eland, PharmD, BCPS.   Work Cell: [phone] 09/29/2018 3:51 PM

## 2018-09-29 NOTE — Progress Notes (Signed)
HD Tx started w/o complication   22/02/54 2706  Vital Signs  Pulse Rate 95  Resp 18  BP 135/78  BP Location Left Wrist  BP Method Automatic  Patient Position (if appropriate) Lying  Oxygen Therapy  SpO2 100 %  During Hemodialysis Assessment  Blood Flow Rate (mL/min) 400 mL/min  Arterial Pressure (mmHg) -160 mmHg  Venous Pressure (mmHg) 130 mmHg  Transmembrane Pressure (mmHg) 50 mmHg  Ultrafiltration Rate (mL/min) 500 mL/min  Dialysate Flow Rate (mL/min) 800 ml/min  Conductivity: Machine  14  HD Safety Checks Performed Yes  Dialysis Fluid Bolus Normal Saline  Bolus Amount (mL) 250 mL  Intra-Hemodialysis Comments Tx initiated

## 2018-09-29 NOTE — Progress Notes (Signed)
Post HD Tx    09/29/18 1823  Vital Signs  Temp 98.9 F (37.2 C)  Temp Source Oral  Pulse Rate 93  Pulse Rate Source Monitor  Resp (!) 22  BP (!) 160/76  BP Location Left Wrist  BP Method Automatic  Patient Position (if appropriate) Lying  Oxygen Therapy  SpO2 100 %  O2 Device Room Air  Pain Assessment  Pain Scale 0-10  Pain Score 0  Dialysis Weight  Weight (!) 156 kg  Type of Weight Post-Dialysis  Post-Hemodialysis Assessment  Rinseback Volume (mL) 250 mL  KECN 65.8 V  Dialyzer Clearance Lightly streaked  Duration of HD Treatment -hour(s) 3.5 hour(s)  Hemodialysis Intake (mL) 500 mL  UF Total -Machine (mL) 1500 mL  Net UF (mL) 1000 mL  Tolerated HD Treatment Yes  Hemodialysis Catheter Left Internal jugular Triple lumen Temporary  Placement Date/Time: 09/21/18 1100   Placed prior to admission: No  Time Out: Correct patient;Correct procedure;Correct site  Maximum sterile barrier precautions: Hand hygiene;Sterile gown;Cap;Sterile gloves;Large sterile sheet;Mask  Site Prep: Chlorh...  Site Condition No complications  Catheter fill solution 4% Sodium Citrate  Catheter fill volume (Arterial) 1.4 cc  Catheter fill volume (Venous) 1.4  Post treatment catheter status Capped and Clamped

## 2018-09-29 NOTE — Progress Notes (Signed)
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 Heparin     Heparin antibody positive; SRA pending  . Lipitor [Atorvastatin] Hives and Itching  . Other   . Viagra  [Sildenafil Citrate]     vision loss    Patient Measurements: Height: 6\' 4"  (193 cm) Weight: (!) 343 lb 14.7 oz (156 kg) IBW/kg (Calculated) : 86.8 Heparin Dosing Weight:   Vital Signs: Temp: 98.9 F (37.2 C) (06/12 1823) Temp Source: Oral (06/12 1823) BP: 160/76 (06/12 1823) Pulse Rate: 93 (06/12 1823)  Labs: Recent Labs    09/27/18 0512 09/28/18 0253 09/28/18 0817 09/29/18 0504 09/29/18 1903  HGB 9.7* 8.9*  --  8.6*  --   HCT 29.7* 27.3*  --  26.8*  --   PLT 274 373  --  499*  --   APTT 71* 88* 80* 49* 60*  LABPROT 24.2* 27.5*  --  18.7*  --   INR 2.2* 2.6*  --  1.6*  --   CREATININE 8.38*  --  10.19* 10.82*  --   CKTOTAL  --   --   --  89  --     Estimated Creatinine Clearance: 11.8 mL/min (A) (by C-G formula based on SCr of 10.82 mg/dL (H)).   Medical History: Past Medical History:  Diagnosis Date  . Allergy   . Anxiety   . Arthritis    knees  . Asthma, mild intermittent, well-controlled   . Chronic obstructive asthma ([place])   . COPD (chronic obstructive pulmonary disease) ([place])   . Depression with anxiety   . Diabetes ([place])   . GERD (gastroesophageal reflux disease)   . HBP (high blood pressure)   . High cholesterol   . Renal cyst, right   . Sleep apnea    CPAP  . Swelling     Medications:  Medications Prior to Admission  Medication Sig Dispense Refill Last Dose  . acetaminophen (TYLENOL) 500 MG tablet Take 1 tablet (500 mg total) by mouth every 6 (six) hours as needed. 90 tablet 0 09/12/2018 at 0800  . albuterol (PROVENTIL HFA;VENTOLIN HFA) 108 (90 Base) MCG/ACT inhaler INHALE 2 PUFFS INTO THE LUNGS 4 (FOUR) TIMES DAILY. 6.7 Inhaler 0 09/12/2018 at 0800  . aspirin 81 MG tablet Take 81 mg  by mouth daily.   09/12/2018 at 0800  . atenolol (TENORMIN) 25 MG tablet Take 1 tablet (25 mg total) by mouth every evening. 90 tablet 1 09/11/2018 at 2000  . cephALEXin (KEFLEX) 500 MG capsule Take 500 mg by mouth 3 (three) times daily.   09/12/2018 at 0800  . citalopram (CELEXA) 20 MG tablet Take 1 tablet (20 mg total) by mouth daily. 90 tablet 1 09/12/2018 at 0800  . fluticasone furoate-vilanterol (BREO ELLIPTA) 100-25 MCG/INH AEPB Inhale 1 puff into the lungs daily. 180 each 1 09/12/2018 at 0800  . Multiple Vitamins-Minerals (MENS MULTIVITAMIN PLUS) TABS Take by mouth.   09/12/2018 at 0800  . Omega-3 Fatty Acids (FISH OIL) 1000 MG CAPS Take by mouth daily.   09/12/2018 at 0800  . oxyCODONE-acetaminophen (PERCOCET/ROXICET) 5-325 MG tablet Take 1 tablet by mouth every 4 (four) hours.   09/12/2018 at 0800  . rosuvastatin (CRESTOR) 20 MG tablet TAKE 1 TABLET BY MOUTH EVERY DAY 90 tablet 1 09/12/2018 at 0800  . SENNA-PLUS 8.6-50 MG tablet Take 2 tablets by mouth every morning.   09/12/2018 at 0800  . sulfamethoxazole-trimethoprim (BACTRIM DS) 800-160 MG  tablet Take 1 tablet by mouth 2 (two) times a day.   09/12/2018 at 0800  . XARELTO 10 MG TABS tablet Take 10 mg by mouth daily.    09/12/2018 at 0800  . cetirizine (ZYRTEC) 10 MG tablet Take 10 mg by mouth daily.   prn at prn  . diclofenac sodium (VOLTAREN) 1 % GEL APPLY 4 GRAMS TOPICALLY FOUR TIMES A DAY 100 g 2 prn at prn  . lidocaine (XYLOCAINE) 2 % solution Use as directed 15 mLs in the mouth or throat as needed for mouth pain. 100 mL 0 prn at prn  . triamcinolone cream (KENALOG) 0.1 % triamcinolone acetonide 0.1 % topical cream   prn at prn    Assessment: 58 yr male had B/l knee replacements Sep 01, 2018 was admitted with fever, AKI/ Platelet count has been declining since 6/5. He was started on heparin 6/3. 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  Will F/U on 1st shift if this needs to be restarted   Addendum - Order was stopped prematurely (~8am) - will restart now and check APTT in 6 hours  Stop date for argatroban 6/14 per cardiology recommendation.   6/12:  APTT is still therapeutic.  Will recheck aPTT on 6/13 with AM labs.   WARFARIN: INR 1.6. Level is subtherapeutic. Change in INR  due to argatroban being Dcd.  Patient has received 3 doses of warfarin.  Will continue with Warfarin 5mg  dose this evening x 1 dose  Hgb trending down 9.7>> 8.9>> 8.6- continue to monitor.   Recheck aPTT/INR w/ am labs   Alvah Gilder D 09/29/2018,7:51 PM

## 2018-09-29 NOTE — Progress Notes (Signed)
HD Tx completed, tolerated well, UF goal met.    09/29/18 1819  Vital Signs  Pulse Rate 91  Pulse Rate Source Monitor  Resp (!) 21  BP (!) 146/81  BP Location Left Wrist  BP Method Automatic  Patient Position (if appropriate) Lying  Oxygen Therapy  SpO2 100 %  O2 Device Room Air  During Hemodialysis Assessment  HD Safety Checks Performed Yes  KECN 65.8 KECN  Dialysis Fluid Bolus Normal Saline  Bolus Amount (mL) 250 mL  Intra-Hemodialysis Comments Tx completed;Tolerated well

## 2018-09-29 NOTE — Progress Notes (Signed)
Pre HD Assessment    09/29/18 1440  Neurological  Level of Consciousness Alert  Orientation Level Oriented X4  Respiratory  Respiratory Pattern Regular;Unlabored  Chest Assessment Chest expansion symmetrical  Bilateral Breath Sounds Clear;Diminished  Cardiac  Pulse Regular  Heart Sounds S1, S2  ECG Monitor Yes  Cardiac Rhythm NSR  Vascular  R Radial Pulse +2  L Radial Pulse +2  Edema Generalized  Psychosocial  Psychosocial (WDL) WDL

## 2018-09-29 NOTE — TOC Progression Note (Addendum)
Transition of Care [facility]) - Progression Note    Patient Details  Name: Isaac Cross MRN: [id number] Date of Birth: Nov 18, 1960  Transition of Care [facility]) CM/SW Contact  Marshell Garfinkel, RN Phone Number: 09/29/2018, 12:40 PM  Clinical Narrative:     Romelle Starcher- to determine need for outpatient dialysis.  RNCM spoke with TIna at [facility] [phone] and they have received authorization from insurance today (expires 10/02/18) Auth: [id number] however patient doesn't appear to have a outpatient dialysis chair time and is still being monitored for dialysis need.  If this determination is met before 6/15 he has room at [facility] and nurse to can call report [phone]. Tina with [facility] updated that patient is not ready for discharge today. RNCM spoke with Estill Bamberg with [facility] and patient can be acute renal and she is working on outpatient chair. Per [facility]- patient is not medically ready to transition to SNF. Covid 19 screen requested from MD for [facility]. Update: note from [place] with [facility]- patient will need to sit over the weekend for dialysis. He will be TTS schedule but don't have time yet. Right now, it looks like he can start outpatient dialysis on Tuesday at [facility]. Daptomycin may be an issue due to cost for Skilled nursing. Antibiotic will be delivered to [facility] to be administered.    Expected Discharge Plan: [place] Barriers to Discharge: Waiting for outpatient dialysis, Other (comment)(acute renal currently)  Expected Discharge Plan and Services Expected Discharge Plan: [place] In-house Referral: Clinical Social Work Discharge Planning Services: CM Consult Post Acute Care Choice: [place] arrangements for the past 2 months: [place]: (Patient stated that he is open to home health. ) Date [place]: 09/18/18 Time [place]:  [place] Representative spoke with at [place]: [name]- unsure of when patient will discharge.   Social Determinants of Health (SDOH) Interventions    Readmission Risk Interventions No flowsheet data found.

## 2018-09-30 LAB — BASIC METABOLIC PANEL
Anion gap: 11 (ref 5–15)
BUN: 36 mg/dL — ABNORMAL HIGH (ref 6–20)
CO2: 26 mmol/L (ref 22–32)
Calcium: 7.7 mg/dL — ABNORMAL LOW (ref 8.9–10.3)
Chloride: 98 mmol/L (ref 98–111)
Creatinine, Ser: 8.21 mg/dL — ABNORMAL HIGH (ref 0.61–1.24)
GFR calc Af Amer: 7 mL/min — ABNORMAL LOW (ref >60)
GFR calc non Af Amer: 6 mL/min — ABNORMAL LOW (ref >60)
Glucose, Bld: 100 mg/dL — ABNORMAL HIGH (ref 70–99)
Potassium: 3.3 mmol/L — ABNORMAL LOW (ref 3.5–5.1)
Sodium: 135 mmol/L (ref 135–145)

## 2018-09-30 LAB — CBC
HCT: 25.7 % — ABNORMAL LOW (ref 39.0–52.0)
Hemoglobin: 8.4 g/dL — ABNORMAL LOW (ref 13.0–17.0)
MCH: 26.7 pg (ref 26.0–34.0)
MCHC: 32.7 g/dL (ref 30.0–36.0)
MCV: 81.6 fL (ref 80.0–100.0)
Platelets: 504 10*3/uL — ABNORMAL HIGH (ref 150–400)
RBC: 3.15 MIL/uL — ABNORMAL LOW (ref 4.22–5.81)
RDW: 15.2 % (ref 11.5–15.5)
WBC: 10.2 10*3/uL (ref 4.0–10.5)
nRBC: 0 % (ref 0.0–0.2)

## 2018-09-30 LAB — PROTIME-INR
INR: 2.2 — ABNORMAL HIGH (ref 0.8–1.2)
Prothrombin Time: 24.1 seconds — ABNORMAL HIGH (ref 11.4–15.2)

## 2018-09-30 LAB — POTASSIUM: Potassium: 3.5 mmol/L (ref 3.5–5.1)

## 2018-09-30 LAB — GLUCOSE, CAPILLARY
Glucose-Capillary: 77 mg/dL (ref 70–99)
Glucose-Capillary: 116 mg/dL — ABNORMAL HIGH (ref 70–99)
Glucose-Capillary: 150 mg/dL — ABNORMAL HIGH (ref 70–99)
Glucose-Capillary: 110 mg/dL — ABNORMAL HIGH (ref 70–99)

## 2018-09-30 LAB — APTT: aPTT: 75 seconds — ABNORMAL HIGH (ref 24–36)

## 2018-09-30 MED ORDER — WARFARIN SODIUM 5 MG PO TABS
5.0000 mg | ORAL_TABLET | Freq: Once | ORAL | Status: AC
  Administered 2018-09-30: 5 mg via ORAL
  Filled 2018-09-30: qty 1

## 2018-09-30 MED ORDER — POTASSIUM CHLORIDE 10 MEQ/100ML IV SOLN
10.0000 meq | INTRAVENOUS | Status: AC
  Administered 2018-09-30 (×4): 10 meq via INTRAVENOUS
  Filled 2018-09-30 (×4): qty 100

## 2018-09-30 NOTE — Progress Notes (Signed)
Occupational Therapy Treatment Patient Details Name: Isaac Cross MRN: [id number] DOB: [birth year] Today's Date: 09/30/2018    History of present illness Pt is a 58 y.o. male presenting to hospital 09/12/18 after rolling OOB landing on L knee; pt with increased dizziness x2 days, fevers x3 days, and generalized weakness.  S/p B TKR 09/01/18 in [place] (pt reports discharging home same day).  Pt admitted with sepsis, acute renal failure, and B TKR.  Pt s/p L knee 5/31 and R knee 6/1 I&D with poly exchange.  Pt noted with severe encephalopathy from DT's with extensive ETOH abuse.  L LE DVT noted.  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  HD started 6/5 with L IJ temporary HD catheter.  PMH includes B TKR 09/01/18 in [place], DM, asthma, R vein surgery, COPD.   OT comments  Pt seen for co-tx with PT this date to address functional transfers and toileting. Pt seated EOB with PT upon OT's arrival. Pt eager to get to [facility] for BM. With bari BSC set up, pt able to perform STS transfer from EOB elevated with CGAx2 with cues for hand placement and sequencing. Pt able to perform pericare with lateral lean from [facility] with CGA after set up. Noted to require additional assist once back in bed for thoroughness and skin integrity. Min A to complete pericare with rolling side to side in bed (PT assist for log roll). Pt progressing well towards goals. Continues to benefit from skilled OT services and continue to recommend CIR at discharge.    Follow Up Recommendations  CIR    Equipment Recommendations  3 in 1 bedside commode(bariatric BSC, long handled reacher)    Recommendations for Other Services      Precautions / Restrictions Precautions Precautions: Fall Precaution Comments: L IJ temporary HD catheter (Per discussion with Dr. Holley Raring and nephrology note 09/27/18 "Ok for patient to work with PT out of bed with temporary IJ dialysis catheter in place". Restrictions Weight Bearing Restrictions: Yes RLE Weight  Bearing: Weight bearing as tolerated LLE Weight Bearing: Weight bearing as tolerated       Mobility Bed Mobility Overal bed mobility: Needs Assistance Bed Mobility: Rolling;Sit to Supine Rolling: Min assist;Mod assist     Sit to supine: +2 for physical assistance;+2 for safety/equipment;Mod assist   General bed mobility comments: BLE mgt primarily, cues for foot/hand placement  Transfers Overall transfer level: Needs assistance Equipment used: Rolling walker (2 wheeled) Transfers: Sit to/from Stand Sit to Stand: Min guard;Min assist;From elevated surface;+2 physical assistance;+2 safety/equipment         General transfer comment: initial STS from EOB elevated performed with CGA x2, STS from [facility] requiring Min Ax2 2/2 lower height surface    Balance Overall balance assessment: Needs assistance Sitting-balance support: No upper extremity supported;Feet supported Sitting balance-Leahy Scale: Fair     Standing balance support: Bilateral upper extremity supported Standing balance-Leahy Scale: Poor Standing balance comment: requires BUE support on RW                           ADL either performed or assessed with clinical judgement   ADL Overall ADL's : Needs assistance/impaired                         Toilet Transfer: RW;Requires wide/bariatric;Ambulation;Cueing for safety;Cueing for sequencing;BSC;Min guard;Minimal assistance;+2 for physical assistance Toilet Transfer Details (indicate cue type and reason): Stand>sit to bari [facility] - Eat with CGA+2 for  safety and cues for sequencing/safety; requiring Min Ax2 for sit> stand after toileting 2/2 lower height Toileting- Clothing Manipulation and Hygiene: Min guard;Set up;Minimal assistance;Sitting/lateral lean;Bed level Toileting - Clothing Manipulation Details (indicate cue type and reason): pt able to perform pericare with lateral lean from [facility] with CGA after set up. Noted to require additional assist once in bed  for thoroughness and skin integrity. Min A to complete pericare with rolling side to side in bed (PT assist for log roll)             Vision Baseline Vision/History: Wears glasses Wears Glasses: At all times Patient Visual Report: No change from baseline     Perception     Praxis      Cognition Arousal/Alertness: Awake/alert Behavior During Therapy: Flat affect Overall Cognitive Status: Impaired/Different from baseline Area of Impairment: Memory;Problem solving                     Memory: Decreased short-term memory       Problem Solving: Slow processing;Decreased initiation;Difficulty sequencing;Requires verbal cues General Comments: Pt unable to recall who sent him flowers when provided their names or previously meeting and working with this therapist, but was able to recall previous fishing trip in detail; follows commands well, increase time to initiate        Exercises Other Exercises Other Exercises: toileting training with functional transfers, bari [facility] used   Shoulder Instructions       General Comments      Pertinent Vitals/ Pain       Pain Assessment: Faces Faces Pain Scale: Hurts a little bit Pain Location: knees Pain Descriptors / Indicators: Sore Pain Intervention(s): Limited activity within patient's tolerance;Monitored during session;Repositioned  Home Living                                          Prior Functioning/Environment              Frequency  Min 3X/week        Progress Toward Goals  OT Goals(current goals can now be found in the care plan section)  Progress towards OT goals: Progressing toward goals  Acute Rehab OT Goals Patient Stated Goal: to go home OT Goal Formulation: With patient Time For Goal Achievement: 10/09/18 Potential to Achieve Goals: Good  Plan Discharge plan remains appropriate;Frequency remains appropriate    Co-evaluation    PT/OT/SLP Co-Evaluation/Treatment:  Yes Reason for Co-Treatment: For patient/therapist safety;To address functional/ADL transfers PT goals addressed during session: Mobility/safety with mobility;Balance;Proper use of DME OT goals addressed during session: ADL's and self-care;Proper use of Adaptive equipment and DME      AM-PAC OT "6 Clicks" Daily Activity     Outcome Measure   Help from another person eating meals?: None Help from another person taking care of personal grooming?: None Help from another person toileting, which includes using toliet, bedpan, or urinal?: A Lot Help from another person bathing (including washing, rinsing, drying)?: A Lot Help from another person to put on and taking off regular upper body clothing?: A Little Help from another person to put on and taking off regular lower body clothing?: A Lot 6 Click Score: 17    End of Session Equipment Utilized During Treatment: Gait belt;Rolling walker  OT Visit Diagnosis: Other abnormalities of gait and mobility (R26.89);History of falling (Z91.81);Pain Pain - Right/Left: Left(both) Pain - part  of body: Knee   Activity Tolerance Patient tolerated treatment well   Patient Left in bed;with call bell/phone within reach;with bed alarm set   Nurse Communication          Time: 3582-5189 OT Time Calculation (min): 29 min  Charges: OT General Charges $OT Visit: 1 Visit OT Treatments $Self Care/Home Management : 23-37 mins  Jeni Salles, MPH, MS, OTR/L ascom [phone] 09/30/18, 11:33 AM

## 2018-09-30 NOTE — Progress Notes (Signed)
Received call from telemetry advising that pt ad QT [phone] notified provider ordered EKG

## 2018-09-30 NOTE — Progress Notes (Signed)
Pt sleeping with his cpap refused to eat lunch says he is tired and just wants to rest

## 2018-09-30 NOTE — Progress Notes (Signed)
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 Lipitor [Atorvastatin] Hives and Itching  . Other   . Viagra  [Sildenafil Citrate]     vision loss    Patient Measurements: Height: 6\' 4"  (193 cm) Weight: (!) 343 lb 14.7 oz (156 kg) IBW/kg (Calculated) : 86.8 Heparin Dosing Weight:   Vital Signs: Temp: 98.7 F (37.1 C) (06/13 0059) Temp Source: Oral (06/12 1823) BP: 142/87 (06/13 0059) Pulse Rate: 95 (06/13 0059)  Labs: Recent Labs    09/27/18 0512 09/28/18 0253 09/28/18 0817 09/29/18 0504 09/29/18 1903 09/30/18 0025  HGB 9.7* 8.9*  --  8.6*  --   --   HCT 29.7* 27.3*  --  26.8*  --   --   PLT 274 373  --  499*  --   --   APTT 71* 88* 80* 49* 60* 75*  LABPROT 24.2* 27.5*  --  18.7*  --  24.1*  INR 2.2* 2.6*  --  1.6*  --  2.2*  CREATININE 8.38*  --  10.19* 10.82*  --   --   CKTOTAL  --   --   --  89  --   --     Estimated Creatinine Clearance: 11.8 mL/min (A) (by C-G formula based on SCr of 10.82 mg/dL (H)).   Medical History: Past Medical History:  Diagnosis Date  . Allergy   . Anxiety   . Arthritis    knees  . Asthma, mild intermittent, well-controlled   . Chronic obstructive asthma ([place])   . COPD (chronic obstructive pulmonary disease) ([place])   . Depression with anxiety   . Diabetes ([place])   . GERD (gastroesophageal reflux disease)   . HBP (high blood pressure)   . High cholesterol   . Renal cyst, right   . Sleep apnea    CPAP  . Swelling     Medications:  Medications Prior to Admission  Medication Sig Dispense Refill Last Dose  . acetaminophen (TYLENOL) 500 MG tablet Take 1 tablet (500 mg total) by mouth every 6 (six) hours as needed. 90 tablet 0 09/12/2018 at 0800  . albuterol (PROVENTIL HFA;VENTOLIN HFA) 108 (90 Base) MCG/ACT inhaler INHALE 2 PUFFS INTO THE LUNGS 4 (FOUR) TIMES DAILY.  6.7 Inhaler 0 09/12/2018 at 0800  . aspirin 81 MG tablet Take 81 mg by mouth daily.   09/12/2018 at 0800  . atenolol (TENORMIN) 25 MG tablet Take 1 tablet (25 mg total) by mouth every evening. 90 tablet 1 09/11/2018 at 2000  . cephALEXin (KEFLEX) 500 MG capsule Take 500 mg by mouth 3 (three) times daily.   09/12/2018 at 0800  . citalopram (CELEXA) 20 MG tablet Take 1 tablet (20 mg total) by mouth daily. 90 tablet 1 09/12/2018 at 0800  . fluticasone furoate-vilanterol (BREO ELLIPTA) 100-25 MCG/INH AEPB Inhale 1 puff into the lungs daily. 180 each 1 09/12/2018 at 0800  . Multiple Vitamins-Minerals (MENS MULTIVITAMIN PLUS) TABS Take by mouth.   09/12/2018 at 0800  . Omega-3 Fatty Acids (FISH OIL) 1000 MG CAPS Take by mouth daily.   09/12/2018 at 0800  . oxyCODONE-acetaminophen (PERCOCET/ROXICET) 5-325 MG tablet Take 1 tablet by mouth every 4 (four) hours.   09/12/2018 at 0800  . rosuvastatin (CRESTOR) 20 MG tablet TAKE 1 TABLET BY MOUTH EVERY DAY 90 tablet 1 09/12/2018 at 0800  . SENNA-PLUS  8.6-50 MG tablet Take 2 tablets by mouth every morning.   09/12/2018 at 0800  . sulfamethoxazole-trimethoprim (BACTRIM DS) 800-160 MG tablet Take 1 tablet by mouth 2 (two) times a day.   09/12/2018 at 0800  . XARELTO 10 MG TABS tablet Take 10 mg by mouth daily.    09/12/2018 at 0800  . cetirizine (ZYRTEC) 10 MG tablet Take 10 mg by mouth daily.   prn at prn  . diclofenac sodium (VOLTAREN) 1 % GEL APPLY 4 GRAMS TOPICALLY FOUR TIMES A DAY 100 g 2 prn at prn  . lidocaine (XYLOCAINE) 2 % solution Use as directed 15 mLs in the mouth or throat as needed for mouth pain. 100 mL 0 prn at prn  . triamcinolone cream (KENALOG) 0.1 % triamcinolone acetonide 0.1 % topical cream   prn at prn    Assessment: 58 yr male had B/l knee replacements Sep 01, 2018 was admitted with fever, AKI/ Platelet count has been declining since 6/5. He was started on heparin 6/3. 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 Continue argatroban 0.4 mcg/kg/min. Will continue to check aPTT and CBC with AM labs daily.    Stop date for argatroban 6/14 per cardiology recommendation.    WARFARIN: INR 2.2. Level is therapeutic.  Patient has received 4 doses of warfarin 5mg .   Will continue with Warfarin 5mg  dose this evening x 1 dose  Hgb trending down 9.7>> 8.9>> 8.6- continue to monitor.   Recheck aPTT/INR w/ am labs  Pernell Dupre, PharmD, BCPS Clinical Pharmacist 09/30/2018 5:16 AM

## 2018-09-30 NOTE — Progress Notes (Signed)
[facility]  ROUNDING NOTE   Subjective:  Significant improvement noted in urine output. Urine output was 3.8 L over the preceding 24 hours. Patient did undergo dialysis yesterday for azotemia..  Objective:  Vital signs in last 24 hours:  Temp:  [98.5 F (36.9 C)-98.9 F (37.2 C)] 98.5 F (36.9 C) (06/13 0726) Pulse Rate:  [88-95] 94 (06/13 0726) Resp:  [16-28] 18 (06/13 0726) BP: (142-160)/(75-98) 156/93 (06/13 0726) SpO2:  [90 %-100 %] 96 % (06/13 0726) Weight:  [156 kg] 156 kg (06/12 1823)  Weight change: -0.081 kg Filed Weights   09/29/18 0630 09/29/18 1445 09/29/18 1823  Weight: (!) 157.1 kg (!) 157 kg (!) 156 kg    Intake/Output: I/O last 3 completed shifts: In: 110.5 [I.V.:93.6; IV Piggyback:17] Out: 0076 [Urine:5050; Other:1000; Stool:1]   Intake/Output this shift:  Total I/O In: 619.3 [P.O.:290; I.V.:118.8; IV Piggyback:210.4] Out: 750 [Urine:750]  Physical Exam: General: NAD  Head: Moist oral mucosal membranes  Eyes: Anicteric   Lungs:  Scatterd rhonchi, normal effort  Heart: S1S2 no rubs  Abdomen:  Soft, nontender, obese, distended  Extremities: + peripheral edema., b/l knee dressing  Neurologic: Awake, alert, conversant  Skin: warm        Basic Metabolic Panel: Recent Labs  Lab 09/24/18 0327 09/25/18 0444 09/26/18 1027 09/27/18 0512 09/28/18 0817 09/29/18 0504 09/29/18 1657 09/30/18 0025 09/30/18 0939  NA 144 142 140 138  --  138  --   --  135  K 3.1* 3.4* 2.9* 3.4*  --  3.2*  --  3.5 3.3*  CL 105 103 102 101  --  101  --   --  98  CO2 28 24 25 25   --  22  --   --  26  GLUCOSE 158* 234* 100* 121*  --  94  --   --  100*  BUN 56* 52* 66* 52*  --  61*  --   --  36*  CREATININE 8.30* 7.57* 9.23* 8.38* 10.19* 10.82*  --   --  8.21*  CALCIUM 7.6* 7.7* 7.7* 7.6*  --  8.1*  --   --  7.7*  MG  --  1.9  --   --   --   --   --   --   --   PHOS 3.7 4.5 4.4  --   --   --  5.3*  --   --     Liver Function Tests: Recent Labs  Lab  09/24/18 0327 09/25/18 0444 09/26/18 1027 09/29/18 1642  AST  --  56*  --  38  ALT  --  37  --  22  ALKPHOS  --  64  --  58  BILITOT  --  0.7  --  0.7  PROT  --  6.0*  --  5.6*  ALBUMIN 1.9* 2.0* 1.8* 1.8*   No results for input(s): LIPASE, AMYLASE in the last 168 hours. No results for input(s): AMMONIA in the last 168 hours.  CBC: Recent Labs  Lab 09/26/18 0326 09/27/18 0512 09/28/18 0253 09/29/18 0504 09/30/18 0535  WBC 10.4 12.1* 10.4 11.2* 10.2  HGB 8.8* 9.7* 8.9* 8.6* 8.4*  HCT 26.9* 29.7* 27.3* 26.8* 25.7*  MCV 83.5 83.0 82.0 81.5 81.6  PLT 227 274 373 499* 504*    Cardiac Enzymes: Recent Labs  Lab 09/26/18 0326 09/29/18 0504  CKTOTAL 90 89    BNP: Invalid input(s): POCBNP  CBG: Recent Labs  Lab 09/29/18 0741 09/29/18 1137  09/29/18 2017 09/30/18 0731 09/30/18 1218  GLUCAP 84 88 103* 77 116*    Microbiology: Results for orders placed or performed during the hospital encounter of 09/12/18  Blood Culture (routine x 2)     Status: None   Collection Time: 09/12/18  9:56 AM   Specimen: BLOOD LEFT HAND  Result Value Ref Range Status   Specimen Description BLOOD LEFT HAND  Final   Special Requests   Final    BOTTLES DRAWN AEROBIC AND ANAEROBIC Blood Culture adequate volume   Culture   Final    NO GROWTH 5 DAYS Performed at [facility], [address], [place], [place] [address]    Report Status 09/17/2018 FINAL  Final  Urine culture     Status: None   Collection Time: 09/12/18 10:01 AM   Specimen: Urine, Random  Result Value Ref Range Status   Specimen Description   Final    URINE, RANDOM Performed at [facility], [address] [place], [place] [address]    Special Requests   Final    NONE Performed at [facility], [address], [place], [place] [address]    Culture   Final    NO GROWTH Performed at [facility], [place] [address], [place], [place] [address]    Report Status 09/13/2018 FINAL  Final   SARS Coronavirus 2 (CEPHEID- Performed in [facility] hospital lab), Hosp Order     Status: None   Collection Time: 09/12/18 10:02 AM   Specimen: Nasopharyngeal Swab  Result Value Ref Range Status   SARS Coronavirus 2 NEGATIVE NEGATIVE Final    Comment: (NOTE) If result is NEGATIVE SARS-CoV-2 target nucleic acids are NOT DETECTED. The SARS-CoV-2 RNA is generally detectable in upper and lower  respiratory specimens during the acute phase of infection. The lowest  concentration of SARS-CoV-2 viral copies this assay can detect is 250  copies / mL. A negative result does not preclude SARS-CoV-2 infection  and should not be used as the sole basis for treatment or other  patient management decisions.  A negative result may occur with  improper specimen collection / handling, submission of specimen other  than nasopharyngeal swab, presence of viral mutation(s) within the  areas targeted by this assay, and inadequate number of viral copies  (<250 copies / mL). A negative result must be combined with clinical  observations, patient history, and epidemiological information. If result is POSITIVE SARS-CoV-2 target nucleic acids are DETECTED. The SARS-CoV-2 RNA is generally detectable in upper and lower  respiratory specimens dur ing the acute phase of infection.  Positive  results are indicative of active infection with SARS-CoV-2.  Clinical  correlation with patient history and other diagnostic information is  necessary to determine patient infection status.  Positive results do  not rule out bacterial infection or co-infection with other viruses. If result is PRESUMPTIVE POSTIVE SARS-CoV-2 nucleic acids MAY BE PRESENT.   A presumptive positive result was obtained on the submitted specimen  and confirmed on repeat testing.  While 2019 novel coronavirus  (SARS-CoV-2) nucleic acids may be present in the submitted sample  additional confirmatory testing may be necessary for epidemiological   and / or clinical management purposes  to differentiate between  SARS-CoV-2 and other Sarbecovirus currently known to infect humans.  If clinically indicated additional testing with an alternate test  methodology [id number]) is advised. The SARS-CoV-2 RNA is generally  detectable in upper and lower respiratory sp ecimens during the acute  phase of infection. The expected result is Negative. Fact Sheet for Patients:  StrictlyIdeas.no Fact Sheet for Healthcare Providers: BankingDealers.co.za This test is not yet approved or cleared by the [place] FDA and has been authorized for detection and/or diagnosis of SARS-CoV-2 by FDA under an Emergency Use Authorization (EUA).  This EUA will remain in effect (meaning this test can be used) for the duration of the COVID-19 declaration under Section 564(b)(1) of the Act, 21 U.S.C. section 360bbb-3(b)(1), unless the authorization is terminated or revoked sooner. Performed at [facility], [address] [place], [place] [address]   Blood Culture (routine x 2)     Status: None   Collection Time: 09/12/18 10:06 AM   Specimen: BLOOD  Result Value Ref Range Status   Specimen Description BLOOD LEFT ANTECUBITAL  Final   Special Requests   Final    BOTTLES DRAWN AEROBIC AND ANAEROBIC Blood Culture adequate volume   Culture   Final    NO GROWTH 5 DAYS Performed at [facility], [address] [place], [place] [address]    Report Status 09/17/2018 FINAL  Final  MRSA PCR Screening     Status: None   Collection Time: 09/12/18  7:04 PM   Specimen: Nasal Mucosa; Nasopharyngeal  Result Value Ref Range Status   MRSA by PCR NEGATIVE NEGATIVE Final    Comment:        The GeneXpert MRSA Assay (FDA approved for NASAL specimens only), is one component of a comprehensive MRSA colonization surveillance program. It is not intended to diagnose MRSA infection nor to guide or monitor  treatment for MRSA infections. Performed at [facility], [address] [place], [place] [address]   C difficile quick scan w PCR reflex     Status: None   Collection Time: 09/14/18  2:21 PM   Specimen: STOOL  Result Value Ref Range Status   C Diff antigen NEGATIVE NEGATIVE Final   C Diff toxin NEGATIVE NEGATIVE Final   C Diff interpretation No C. difficile detected.  Final    Comment: Performed at [facility], [address] [place], [place] [address]  Group A Strep by PCR     Status: None   Collection Time: 09/15/18  1:53 PM  Result Value Ref Range Status   Group A Strep by PCR NOT DETECTED NOT DETECTED Final    Comment: Performed at [facility], [address], [place], [place] [address]  Body fluid culture     Status: None   Collection Time: 09/15/18  5:13 PM   Specimen: KNEE  Result Value Ref Range Status   Specimen Description   Final    KNEE Performed at [facility], [address] [place], [place] [address]    Special Requests   Final    NONE Performed at [facility], [address] [place], [place] [address]    Gram Stain   Final    RARE RBCS FEW WBC SEEN NO ORGANISMS SEEN Performed at [facility], [address], [place],  [address]    Culture   Final    NO GROWTH 3 DAYS Performed at [facility], [place] [address], [place],  [address]    Report Status 09/19/2018 FINAL  Final  Body fluid culture     Status: None   Collection Time: 09/16/18  8:55 AM   Specimen: KNEE  Result Value Ref Range Status   Specimen Description KNEE RIGHT  Final  Special Requests NONE  Final   Gram Stain   Final    RARE WBC PRESENT, PREDOMINANTLY PMN NO ORGANISMS SEEN    Culture   Final    NO GROWTH 3 DAYS Performed at [place] [address], [place], [place] [address]    Report Status 09/20/2018 FINAL  Final  Aerobic/Anaerobic Culture (surgical/deep wound)     Status: None    Collection Time: 09/17/18 11:32 AM   Specimen: Wound  Result Value Ref Range Status   Specimen Description   Final    TISSUE LEFT KNEE 1 Performed at [facility], [address] [place], [place] [address]    Special Requests PATIENT ON FOLLOWING MAXIPIME VANCOMYCIN  Final   Gram Stain   Final    FEW WBC PRESENT,BOTH PMN AND MONONUCLEAR NO ORGANISMS SEEN    Culture   Final    No growth aerobically or anaerobically. Performed at [facility], [place] [address] [place], [place] [address]    Report Status 09/22/2018 FINAL  Final  Acid Fast Smear (AFB)     Status: None   Collection Time: 09/17/18 11:32 AM   Specimen: Synovium  Result Value Ref Range Status   AFB Specimen Processing Comment  Final    Comment: Tissue Grinding and Digestion/Decontamination   Acid Fast Smear Negative  Final    Comment: (NOTE) Performed At: [facility] [place], [place] [id number] Rush Farmer MD [id number]    Source (AFB) TISSUE  Final    Comment: LEFT KNEE Performed at [facility], [place] [address], [place], [place] [address]   Culture, fungus without smear     Status: None (Preliminary result)   Collection Time: 09/17/18 11:34 AM   Specimen: Synovium; Other  Result Value Ref Range Status   Specimen Description   Final    SYNOVIAL Performed at [facility], [address], [place], [place] [address]    Special Requests   Final    NONE Performed at [facility], [address] [place], [place] [address]    Culture   Final    NO FUNGUS ISOLATED AFTER 10 DAYS Performed at [facility], [place] [address], [place], [place] [address]    Report Status PENDING  Incomplete  Acid Fast Culture with reflexed sensitivities     Status: None   Collection Time: 09/17/18 11:36 AM   Specimen: Synovium  Result Value Ref Range Status   Acid Fast Culture CANC  Final    Comment: (NOTE) Test cancelled at client's request.       Cancelled per Levin Erp 09/21/2018 Performed At: [facility] [place], [place] [id number] Rush Farmer MD [id number]    Source of Sample TISSUE  Final    Comment: RIGHT KNEE Performed at [facility], [place] [address], [place], [place] [address]   Aerobic/Anaerobic Culture (surgical/deep wound)     Status: None   Collection Time: 09/17/18 11:43 AM   Specimen: Wound  Result Value Ref Range Status   Specimen Description   Final    TISSUE LEFT KNEE 2 Performed at [facility], [address] [place], [place] [address]    Special Requests PATIENT ON FOLLOWING MAXIPIME VANCOMYCIN  Final   Gram Stain   Final    FEW WBC PRESENT, PREDOMINANTLY PMN NO ORGANISMS SEEN    Culture   Final    No growth aerobically or anaerobically. Performed  at [facility], [place] [address] [place], [place] [address]    Report Status 09/22/2018 FINAL  Final  Aerobic/Anaerobic Culture (surgical/deep wound)     Status: None   Collection Time: 09/17/18 11:44 AM   Specimen: Wound  Result Value Ref Range Status   Specimen Description   Final    TISSUE LEFT KNEE 3 Performed at [facility], [address] [place], [place] [address]    Special Requests PATIENT ON FOLLOWING MAXIPIME VANCOMYCIN  Final   Gram Stain   Final    MODERATE WBC PRESENT, PREDOMINANTLY PMN NO ORGANISMS SEEN    Culture   Final    No growth aerobically or anaerobically. Performed at [facility], [place] [address], [place], [place] [address]    Report Status 09/22/2018 FINAL  Final  Aerobic/Anaerobic Culture (surgical/deep wound)     Status: None   Collection Time: 09/18/18  9:43 PM   Specimen: [facility] Other; Tissue  Result Value Ref Range Status   Specimen Description   Final    SYNOVIAL Performed at [facility], [address], [place], [place] [address]    Special Requests   Final    NONE Performed at [facility], [address]  [place], [place] [address]    Gram Stain   Final    RARE WBC PRESENT, PREDOMINANTLY MONONUCLEAR NO ORGANISMS SEEN    Culture   Final    No growth aerobically or anaerobically. Performed at [facility], [place] [address] [place], [place] [address]    Report Status 09/24/2018 FINAL  Final  Aerobic/Anaerobic Culture (surgical/deep wound)     Status: None   Collection Time: 09/18/18  9:44 PM   Specimen: [facility] Other; Tissue  Result Value Ref Range Status   Specimen Description   Final    SYNOVIAL Performed at [facility], [address], [place], [place] [address]    Special Requests   Final    RIGHT KNEE Performed at [facility], [place], [place] [address]    Gram Stain   Final    FEW WBC PRESENT,BOTH PMN AND MONONUCLEAR NO ORGANISMS SEEN    Culture   Final    No growth aerobically or anaerobically. Performed at [facility], [place] [address], [place], [place] [address]    Report Status 09/24/2018 FINAL  Final  Aerobic/Anaerobic Culture (surgical/deep wound)     Status: None   Collection Time: 09/18/18  9:45 PM   Specimen: [facility] Other; Tissue  Result Value Ref Range Status   Specimen Description   Final    SYNOVIAL Performed at [facility], [address] [place] [address] [place], [place] [address]    Special Requests   Final    RIGHT KNEE Performed at [facility], [address] [place], [place] [address]    Gram Stain   Final    FEW WBC PRESENT, PREDOMINANTLY MONONUCLEAR NO ORGANISMS SEEN    Culture   Final    No growth aerobically or anaerobically. Performed at [facility], [place] [address] [place] [address], [place], [place] [address]    Report Status 09/24/2018 FINAL  Final  Culture, blood (Routine X 2) w Reflex to ID Panel     Status: None   Collection Time: 09/19/18  1:11 PM   Specimen: BLOOD  Result Value Ref Range Status   Specimen Description BLOOD LEFT ANTECUBITAL  Final   Special Requests   Final  BOTTLES DRAWN  AEROBIC AND ANAEROBIC Blood Culture results may not be optimal due to an excessive volume of blood received in culture bottles   Culture   Final    NO GROWTH 5 DAYS Performed at [facility], [address] [place], [place] [address]    Report Status 09/24/2018 FINAL  Final  Culture, blood (Routine X 2) w Reflex to ID Panel     Status: None   Collection Time: 09/19/18  1:20 PM   Specimen: BLOOD  Result Value Ref Range Status   Specimen Description BLOOD BLOOD RIGHT HAND  Final   Special Requests   Final    BOTTLES DRAWN AEROBIC AND ANAEROBIC Blood Culture results may not be optimal due to an excessive volume of blood received in culture bottles   Culture   Final    NO GROWTH 5 DAYS Performed at [facility], [address] [place], [place] [address]    Report Status 09/24/2018 FINAL  Final  Urine Culture     Status: None   Collection Time: 09/20/18  3:59 PM   Specimen: Urine, Random  Result Value Ref Range Status   Specimen Description   Final    URINE, RANDOM Performed at [facility], [address], [place], [place] [address]    Special Requests   Final    NONE Performed at [facility], [address], [place], [place] [address]    Culture   Final    NO GROWTH Performed at [facility], [place] [address] [place], [place] [address]    Report Status 09/21/2018 FINAL  Final  C difficile quick scan w PCR reflex     Status: None   Collection Time: 09/21/18  3:27 PM   Specimen: STOOL  Result Value Ref Range Status   C Diff antigen NEGATIVE NEGATIVE Final   C Diff toxin NEGATIVE NEGATIVE Final   C Diff interpretation No C. difficile detected.  Final    Comment: Performed at [facility], [address] [place], [place] [address]  Gastrointestinal Panel by PCR , Stool     Status: None   Collection Time: 09/21/18  3:27 PM   Specimen: Stool  Result Value Ref Range Status   Campylobacter species NOT DETECTED NOT DETECTED  Final   Plesimonas shigelloides NOT DETECTED NOT DETECTED Final   Salmonella species NOT DETECTED NOT DETECTED Final   Yersinia enterocolitica NOT DETECTED NOT DETECTED Final   Vibrio species NOT DETECTED NOT DETECTED Final   Vibrio cholerae NOT DETECTED NOT DETECTED Final   Enteroaggregative E coli (EAEC) NOT DETECTED NOT DETECTED Final   Enteropathogenic E coli (EPEC) NOT DETECTED NOT DETECTED Final   Enterotoxigenic E coli (ETEC) NOT DETECTED NOT DETECTED Final   Shiga like toxin producing E coli (STEC) NOT DETECTED NOT DETECTED Final   Shigella/Enteroinvasive E coli (EIEC) NOT DETECTED NOT DETECTED Final   Cryptosporidium NOT DETECTED NOT DETECTED Final   Cyclospora cayetanensis NOT DETECTED NOT DETECTED Final   Entamoeba histolytica NOT DETECTED NOT DETECTED Final   Giardia lamblia NOT DETECTED NOT DETECTED Final   Adenovirus F40/41 NOT DETECTED NOT DETECTED Final   Astrovirus NOT DETECTED NOT DETECTED Final   Norovirus GI/GII NOT DETECTED NOT DETECTED Final   Rotavirus A NOT DETECTED NOT DETECTED Final   Sapovirus (I, II, IV, and V) NOT DETECTED NOT DETECTED Final    Comment: Performed at [facility], [address] [place], [place] [address]  Coagulation Studies: Recent Labs    09/28/18 0253 09/29/18 0504 09/30/18 0025  LABPROT 27.5* 18.7* 24.1*  INR 2.6* 1.6* 2.2*    Urinalysis: No results for input(s): COLORURINE, LABSPEC, PHURINE, GLUCOSEU, HGBUR, BILIRUBINUR, KETONESUR, PROTEINUR, UROBILINOGEN, NITRITE, LEUKOCYTESUR in the last 72 hours.  Invalid input(s): APPERANCEUR    Imaging: No results found.   Medications:   . sodium chloride 5 mL/hr at 09/29/18 0242  . sodium chloride    . anticoagulant sodium citrate    . argatroban 0.4 mcg/kg/min (09/30/18 1457)  . cefTAZidime (FORTAZ)  IV Stopped (09/30/18 0036)  . DAPTOmycin (CUBICIN)  IV Stopped (09/29/18 2018)   . aspirin EC  81 mg Oral Daily  . atenolol  25 mg Oral Daily  . Chlorhexidine  Gluconate Cloth  6 each Topical Q0600  . citalopram  20 mg Oral Daily  . feeding supplement (NEPRO CARB STEADY)  237 mL Oral BID BM  . fluticasone furoate-vilanterol  1 puff Inhalation Daily  . folic acid  1 mg Oral Daily  . insulin aspart  0-5 Units Subcutaneous QHS  . insulin aspart  0-9 Units Subcutaneous TID WC  . mouth rinse  15 mL Mouth Rinse BID  . multivitamin  1 tablet Oral Q1200  . multivitamin with minerals  1 tablet Oral Daily  . omega-3 acid ethyl esters  1 g Oral Daily  . rosuvastatin  20 mg Oral Daily  . sodium chloride flush  10-40 mL Intracatheter Q12H  . sodium chloride flush  3 mL Intravenous Q12H  . warfarin  5 mg Oral ONCE-1800  . Warfarin - Pharmacist Dosing Inpatient   Does not apply q1800   sodium chloride, sodium chloride, acetaminophen **OR** acetaminophen, albuterol, alum & mag hydroxide-simeth, anticoagulant sodium citrate, fentaNYL (SUBLIMAZE) injection, HYDROcodone-acetaminophen, iohexol, ipratropium-albuterol, menthol-cetylpyridinium **OR** phenol, menthol-cetylpyridinium **OR** phenol, metoCLOPramide **OR** metoCLOPramide (REGLAN) injection, ondansetron **OR** ondansetron (ZOFRAN) IV, sodium chloride flush, sodium chloride flush  Assessment/ Plan:  Isaac Cross is a 58 y.o. black male with hyeprtension, COPD, diabetes mellitus type II who underwent bilateral knee replacement surgeries last week. who was admitted to [facility] on 09/12/2018 for sepsis  1. Acute renal failure with metabolic acidosis  baseline creatinine of 1.07 with normal GFR in 06/12/18.  Likely severe ATN from hypotension and sepsis -Urine output significantly improved over the preceding 24 hours.  Urine output was 3.8 L.  Patient did undergo dialysis yesterday for azotemia.  We will hold off on further dialysis treatments given significant improvement in urine output.  2. Septic shock/hypotension  -Recent bilateral knee replacement on Sep 01, 2018 at [facility]  -  knee septic arthritis,  patient underwent I&D and polyethylene exchange on 5/31 and Rt knee on 6/1 -abx management per ID/hospitalist.   3. Hypokalemia -Potassium remains a bit low at 3.3.  Administer another potassium chloride 10 mEq IV x4.       LOS: 18 Isaac Cross 6/13/20203:13 PM

## 2018-09-30 NOTE — Progress Notes (Signed)
[facility] [place] NAME: Isaac Cross    MR#:  [id number]  DATE OF BIRTH:  03/25/1961  SUBJECTIVE:   The patient has no complaints except generalized weakness.  He has good urine output. REVIEW OF SYSTEMS:  Review of Systems  Constitutional: Negative for chills, fever and weight loss.  HENT: Negative for ear discharge, ear pain and nosebleeds.   Eyes: Negative for blurred vision, pain and discharge.  Respiratory: Negative for sputum production, shortness of breath, wheezing and stridor.   Cardiovascular: Negative for chest pain, palpitations, orthopnea and PND.  Gastrointestinal: Negative for abdominal pain, diarrhea, nausea and vomiting.  Genitourinary: Negative for frequency and urgency.  Musculoskeletal: Negative for back pain and joint pain.  Neurological: Positive for weakness. Negative for sensory change, speech change and focal weakness.  Psychiatric/Behavioral: Negative for depression and hallucinations. The patient is not nervous/anxious.    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 rate 18, height 6\' 4"  (1.93 m), weight (!) 156 kg, SpO2 96 %.  PHYSICAL EXAMINATION:  Physical Exam   GENERAL:  58 y.o.-year-old patient lying in the bed with no acute distress. Critically ill appearing EYES: Pupils equal, round, reactive to light and accommodation. No scleral icterus.  HEENT: Head atraumatic, normocephalic. Oropharynx and nasopharynx clear.  NECK:  Supple, no jugular venous distention. No thyroid enlargement, no tenderness.  LUNGS: Normal breath sounds bilaterally, no wheezing, rales,rhonchi or crepitation. No use of accessory muscles of respiration.  Decreased  bibasilar breath sounds CARDIOVASCULAR: S1, S2 normal. No murmurs, rubs, or gallops. left IJ dialysis catheter  ABDOMEN: Soft, nontender, nondistended. Bowel sounds present. No organomegaly or mass. Foley+ EXTREMITIES: Swelling of both legs, knees noted with recent surgery.  No  cyanosis, or clubbing.  Both hands are swollen as well. NEUROLOGIC: Cranial nerves II through XII are intact. Moving all extremities well PSYCHIATRIC: patient is alert and oriented x2 SKIN: No obvious rash, lesion, or ulcer.    LABORATORY PANEL:   CBC Recent Labs  Lab 09/30/18 0535  WBC 10.2  HGB 8.4*  HCT 25.7*  PLT 504*   ------------------------------------------------------------------------------------------------------------------  Chemistries  Recent Labs  Lab 09/25/18 0444  09/29/18 1642  09/30/18 0939  NA 142   < >  --   --  135  K 3.4*   < >  --    < > 3.3*  CL 103   < >  --   --  98  CO2 24   < >  --   --  26  GLUCOSE 234*   < >  --   --  100*  BUN 52*   < >  --   --  36*  CREATININE 7.57*   < >  --   --  8.21*  CALCIUM 7.7*   < >  --   --  7.7*  MG 1.9  --   --   --   --   AST 56*  --  38  --   --   ALT 37  --  22  --   --   ALKPHOS 64  --  58  --   --  BILITOT 0.7  --  0.7  --   --    < > = values in this interval not displayed.   ------------------------------------------------------------------------------------------------------------------  Cardiac Enzymes No results for input(s): TROPONINI in the last 168 hours. ------------------------------------------------------------------------------------------------------------------  RADIOLOGY:  No results found.  EKG:   Orders placed or performed during the hospital encounter of 09/12/18   EKG 12-Lead   EKG 12-Lead   EKG 12-Lead   EKG 12-Lead   EKG 12-Lead   EKG 12-Lead    ASSESSMENT AND PLAN:    EdwardGradyis a58 y.o.malewith a known history of arthritis, COPD not on home oxygen, hypertension, sleep  apnea, non-insulin-dependent diabetes mellitus presents to hospital secondary to dizziness, weakness and a fall  1.Sepsis -s/p Recent bilateral knee replacement surgeries as outpatient and now bilateral knee washouts done this adm. - fluid Cultures are negative so far. - Fungal cultures negative -Appreciate ID consult.   -Patient was on zosyn, doxycycline --now on ceftazidime and daptomycin (last dose 10/31/2018) -completed 7 days of anidulafungin    2. Acute non- Oliguric renal failure-likely ATN from sepsis and hypotension - appreciate nephrology input. -Hemodialysis started 09/22/2018 -Hold lisinopril and metformin and other nephrotoxins.    The patient has good urine output, creatinine is improving.  3. Acute encephalopathy-secondary to metabolic causes, sepsis and uremia. -No focal deficits Improved.  4. Left popliteal vein DVT - was on heparin drip--platelets dropping--IV argatroban--plt count normal--added po coumadin--needs 5 days of bridiging--pharmacy to dose. 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 Diabetes mellitus-2 Continue sliding scale, hold Lantus due to low set of blood sugar 77 this morning.  6. DVT prophylaxis- argatroban drip and coumadin  Anemia of chronic disease.  Stable.  Physical therapy evaluation noted--pt is now agreeable for rehab  I discussed with Dr. Holley Raring. I discussed with his wife.  Generalized weakness.  PT evaluation suggest CRI. CODE STATUS: Full code  TOTAL TIME TAKING CARE OF THIS PATIENT: 35 minutes.   POSSIBLE D/C IN 3 DAYS, DEPENDING ON CLINICAL CONDITION.   Demetrios Loll M.D on 09/30/2018 at 2:17 PM  Between 7am to 6pm - Pager - [phone] After 6pm go to [email or web address] - password [email or web address] [facility]  Office  [phone]  CC: Primary care physician; Steele Sizer, MD

## 2018-09-30 NOTE — Progress Notes (Signed)
Physical Therapy Treatment Patient Details Name: Isaac Cross MRN: [id number] DOB: Oct 28, 1960 Today's Date: 09/30/2018    History of Present Illness Pt is a 58 y.o. male presenting to hospital 09/12/18 after rolling OOB landing on L knee; pt with increased dizziness x2 days, fevers x3 days, and generalized weakness.  S/p B TKR 09/01/18 in [place] (pt reports discharging home same day).  Pt admitted with sepsis, acute renal failure, and B TKR.  Pt s/p L knee 5/31 and R knee 6/1 I&D with poly exchange.  Pt noted with severe encephalopathy from DT's with extensive ETOH abuse.  L LE DVT noted.  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  HD started 6/5 with L IJ temporary HD catheter.  PMH includes B TKR 09/01/18 in [place], DM, asthma, R vein surgery, COPD.    PT Comments    Pt supine in bed upon entry, awake, alert agreeable to participate. Bed exercises reviewed, some still requires maxA to perform d/t hip weakness. modA for bed mobility. STS from EOB 6x minGuard assist, but requires bed elevated to 30" and heavy effort from patient with heavy BUE contribution. From elevated BSC, +2Mod to rise. Pt assisted with toiletting/pericare with OT. Pt fatigued at end of session. Author attempted to gain access to a higher chair in facility, but unable to do this, hence pt made sitting tall in bed, positioned to prevent sacral sitting, heels elevated to achieve TKE, as bed does not allow for this 2/2 abnormally long tibial length of patient and mattress shortness.   Follow Up Recommendations  CIR     Equipment Recommendations  Rolling walker with 5" wheels;3in1 (PT)    Recommendations for Other Services OT consult     Precautions / Restrictions Precautions Precautions: Fall Precaution Comments: L IJ temporary HD catheter (Per discussion with Dr. Holley Raring and nephrology note 09/27/18 "Ok for patient to work with PT out of bed with temporary IJ dialysis catheter in place". Restrictions Weight Bearing  Restrictions: Yes RLE Weight Bearing: Weight bearing as tolerated LLE Weight Bearing: Weight bearing as tolerated    Mobility  Bed Mobility Overal bed mobility: Needs Assistance Bed Mobility: Rolling;Sit to Supine Rolling: Mod assist   Supine to sit: Mod assist;HOB elevated Sit to supine: +2 for physical assistance;+2 for safety/equipment;Mod assist;HOB elevated   General bed mobility comments: BLE mgt primarily, cues for foot/hand placement  Transfers Overall transfer level: Needs assistance Equipment used: Rolling walker (2 wheeled) Transfers: Sit to/from [facility] Sit to Stand: Min guard;From elevated surface(Minguard and heavy effort required from 30" high surface; performed 5x, VC for full stance prior to sitting.) Stand pivot transfers: Min guard(multimodal cues for safety and sequencing during steps for pivot to/from commode)       General transfer comment: initial STS from EOB elevated performed with CGA +2, STS from [facility] requiring Min A+2 2/2 lower height surface  Ambulation/Gait Ambulation/Gait assistance: (unable to attempt at this time)               Chief Strategy Officer    Modified Rankin (Stroke Patients Only)       Balance Overall balance assessment: Needs assistance Sitting-balance support: No upper extremity supported;Feet supported Sitting balance-Leahy Scale: Good     Standing balance support: Bilateral upper extremity supported;During functional activity Standing balance-Leahy Scale: Fair Standing balance comment: requires BUE support on RW  Cognition Arousal/Alertness: Awake/alert Behavior During Therapy: Flat affect Overall Cognitive Status: No family/caregiver present to determine baseline cognitive functioning Area of Impairment: Memory;Problem solving                 Orientation Level: Person;Place;Time;Situation Current Attention Level:  Focused Memory: (unable to recall prior PT sessions) Following Commands: Follows one step commands consistently     Problem Solving: Slow processing;Decreased initiation;Difficulty sequencing;Requires verbal cues General Comments: Pt unable to recall who sent him flowers when provided their names or previously meeting and working with this therapist, but was able to recall previous fishing trip in detail; follows commands well, increase time to initiate      Exercises Total Joint Exercises Short Arc Quad: AROM;Strengthening;Both;Supine;15 reps Heel Slides: AAROM;Strengthening;Both;Supine;15 reps Hip ABduction/ADduction: AAROM;Strengthening;Both;Supine;15 reps Bridges: Other (comment)(assist for set up, able to partially bridge to reposition in bed when cued) Other Exercises Other Exercises: toileting training with functional transfers, bari BSC used    General Comments        Pertinent Vitals/Pain Pain Assessment: No/denies pain Faces Pain Scale: Hurts a little bit Pain Location: knees Pain Descriptors / Indicators: Grimacing Pain Intervention(s): Limited activity within patient's tolerance;Monitored during session;Premedicated before session;Repositioned    Home Living                      Prior Function            PT Goals (current goals can now be found in the care plan section) Acute Rehab PT Goals Patient Stated Goal: to go home PT Goal Formulation: With patient Time For Goal Achievement: 10/13/18 Potential to Achieve Goals: Fair Additional Goals Additional Goal #1: Assess OOB mobility and update goals/POC as appropriate once R femoral arterial line is removed and pt medically appropriate for OOB mobility Progress towards PT goals: Progressing toward goals    Frequency    7X/week      PT Plan Current plan remains appropriate    Co-evaluation PT/OT/SLP Co-Evaluation/Treatment: Yes Reason for Co-Treatment: For patient/therapist safety;To address  functional/ADL transfers PT goals addressed during session: Mobility/safety with mobility;Balance;Proper use of DME;Strengthening/ROM OT goals addressed during session: Proper use of Adaptive equipment and DME;ADL's and self-care      AM-PAC PT "6 Clicks" Mobility   Outcome Measure  Help needed turning from your back to your side while in a flat bed without using bedrails?: A Lot Help needed moving from lying on your back to sitting on the side of a flat bed without using bedrails?: A Lot Help needed moving to and from a bed to a chair (including a wheelchair)?: Total Help needed standing up from a chair using your arms (e.g., wheelchair or bedside chair)?: Total Help needed to walk in hospital room?: Total Help needed climbing 3-5 steps with a railing? : Total 6 Click Score: 8    End of Session Equipment Utilized During Treatment: Gait belt Activity Tolerance: Patient tolerated treatment well Patient left: in bed;with call bell/phone within reach;with bed alarm set;Other (comment)(knees of bed elevated to allow for upright sittign with good control of pelvis to prevent FWD slump; heels elevated to achieve TKE bilat) Nurse Communication: Mobility status;Precautions;Weight bearing status PT Visit Diagnosis: Other abnormalities of gait and mobility (R26.89);Muscle weakness (generalized) (M62.81);Difficulty in walking, not elsewhere classified (R26.2);Pain Pain - Right/Left: Left Pain - part of body: Knee     Time: 2633-3545 PT Time Calculation (min) (ACUTE ONLY): 66 min  Charges:  $Therapeutic Exercise: 23-37 mins  1:08 PM, 09/30/18 Etta Grandchild, PT, DPT Physical Therapist - [facility]  [phone] ([place])    Russell Quinney C 09/30/2018, 1:05 PM

## 2018-10-01 LAB — CBC
HCT: 27 % — ABNORMAL LOW (ref 39.0–52.0)
Hemoglobin: 8.5 g/dL — ABNORMAL LOW (ref 13.0–17.0)
MCH: 26.5 pg (ref 26.0–34.0)
MCHC: 31.5 g/dL (ref 30.0–36.0)
MCV: 84.1 fL (ref 80.0–100.0)
Platelets: 499 10*3/uL — ABNORMAL HIGH (ref 150–400)
RBC: 3.21 MIL/uL — ABNORMAL LOW (ref 4.22–5.81)
RDW: 15 % (ref 11.5–15.5)
WBC: 10.9 10*3/uL — ABNORMAL HIGH (ref 4.0–10.5)
nRBC: 0 % (ref 0.0–0.2)

## 2018-10-01 LAB — BASIC METABOLIC PANEL
Anion gap: 11 (ref 5–15)
BUN: 37 mg/dL — ABNORMAL HIGH (ref 6–20)
CO2: 25 mmol/L (ref 22–32)
Calcium: 7.9 mg/dL — ABNORMAL LOW (ref 8.9–10.3)
Chloride: 98 mmol/L (ref 98–111)
Creatinine, Ser: 8.74 mg/dL — ABNORMAL HIGH (ref 0.61–1.24)
GFR calc Af Amer: 7 mL/min — ABNORMAL LOW (ref >60)
GFR calc non Af Amer: 6 mL/min — ABNORMAL LOW (ref >60)
Glucose, Bld: 94 mg/dL (ref 70–99)
Potassium: 3.4 mmol/L — ABNORMAL LOW (ref 3.5–5.1)
Sodium: 134 mmol/L — ABNORMAL LOW (ref 135–145)

## 2018-10-01 LAB — MAGNESIUM: Magnesium: 1.7 mg/dL (ref 1.7–2.4)

## 2018-10-01 LAB — APTT
aPTT: 97 seconds — ABNORMAL HIGH (ref 24–36)
aPTT: 86 seconds — ABNORMAL HIGH (ref 24–36)
aPTT: 82 seconds — ABNORMAL HIGH (ref 24–36)

## 2018-10-01 LAB — PROTIME-INR
INR: 2.6 — ABNORMAL HIGH (ref 0.8–1.2)
Prothrombin Time: 27.5 seconds — ABNORMAL HIGH (ref 11.4–15.2)

## 2018-10-01 LAB — GLUCOSE, CAPILLARY
Glucose-Capillary: 94 mg/dL (ref 70–99)
Glucose-Capillary: 105 mg/dL — ABNORMAL HIGH (ref 70–99)
Glucose-Capillary: 112 mg/dL — ABNORMAL HIGH (ref 70–99)
Glucose-Capillary: 133 mg/dL — ABNORMAL HIGH (ref 70–99)

## 2018-10-01 LAB — NOVEL CORONAVIRUS, NAA (HOSP ORDER, SEND-OUT TO REF LAB; TAT 18-24 HRS): SARS-CoV-2, NAA: NOT DETECTED

## 2018-10-01 MED ORDER — POTASSIUM CHLORIDE CRYS ER 20 MEQ PO TBCR
40.0000 meq | EXTENDED_RELEASE_TABLET | Freq: Once | ORAL | Status: AC
  Administered 2018-10-01: 40 meq via ORAL
  Filled 2018-10-01: qty 2

## 2018-10-01 MED ORDER — MAGNESIUM SULFATE 2 GM/50ML IV SOLN
2.0000 g | Freq: Once | INTRAVENOUS | Status: AC
  Administered 2018-10-01: 12:00:00 2 g via INTRAVENOUS
  Filled 2018-10-01: qty 50

## 2018-10-01 MED ORDER — WARFARIN SODIUM 5 MG PO TABS
5.0000 mg | ORAL_TABLET | Freq: Once | ORAL | Status: AC
  Administered 2018-10-01: 5 mg via ORAL
  Filled 2018-10-01: qty 1

## 2018-10-01 NOTE — Progress Notes (Addendum)
Physical Therapy Treatment Patient Details Name: Isaac Cross MRN: [id number] DOB: 1960/08/10 Today's Date: 10/01/2018    History of Present Illness Pt is a 58 y.o. male presenting to hospital 09/12/18 after rolling OOB landing on L knee; pt with increased dizziness x2 days, fevers x3 days, and generalized weakness.  S/p B TKR 09/01/18 in [place] (pt reports discharging home same day).  Pt admitted with sepsis, acute renal failure, and B TKR.  Pt s/p L knee 5/31 and R knee 6/1 I&D with poly exchange.  Pt noted with severe encephalopathy from DT's with extensive ETOH abuse.  L LE DVT noted.  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  HD started 6/5 with L IJ temporary HD catheter.  PMH includes B TKR 09/01/18 in [place], DM, asthma, R vein surgery, COPD.    PT Comments    Pt in bed upon arrival, dark room, TV off, blinds closed- pt agreeable to participate. Pt continues to demonstrate flat affect, minimally conversive, with slight malaise, oriented x4, has delayed response time verbally and motorically. Pt able to respond appropriately to broad variety of questions. He struggles to respond with meaningful behavior change to extensive and repeated cues during certain exercises, also noted to maintain eyes closed for much of session whilst supine. Pt denies any needs in session. Pt denies fatigue, effort, pain, or mentation impairment in session. Author explained concern regarding these denials in present time, as it may now be questionable if patient has poor awareness of his current limitations. A constant incongruence remains between patient subjective report and patient presentation, however differentiating between depressed state and an acute mild cognitive impairment is outside of the scope of practice of this author. Pt continues to struggle with A/ROM of BLE for exercises, but is shown AA/ROM with gait belt this date. Transfers from STS remain Supervision level now for day 2, but require a seated  height of 30-inches and extensive BUE contribution. Pt progressing in general but remains very weak and limited.    Follow Up Recommendations  CIR     Equipment Recommendations  3in1 (PT);Other (comment)(bariatric/tall RW (unclear if patient already has one from surgery 1MA))    Recommendations for Other Services OT consult     Precautions / Restrictions Precautions Precautions: Fall Precaution Comments: L IJ temporary HD catheter (Per discussion with Dr. Holley Raring and nephrology note 09/27/18 "Ok for patient to work with PT out of bed with temporary IJ dialysis catheter in place". Restrictions RLE Weight Bearing: Weight bearing as tolerated LLE Weight Bearing: Weight bearing as tolerated    Mobility  Bed Mobility Overal bed mobility: Needs Assistance Bed Mobility: Supine to Sit     Supine to sit: HOB elevated;Min assist     General bed mobility comments: Able to manage BLE OOB with effort, LUE offfered for pulling self to EOB  Transfers Overall transfer level: Needs assistance Equipment used: Rolling walker (2 wheeled);None(Tall/Bari RW) Transfers: Sit to/from [employer] to Stand: From elevated surface;Supervision;Min guard Stand pivot transfers: Min guard;Supervision(several small steps to turn toward chair,heavy BUE dependence.)       General transfer comment: Rises 6x from EOB elevated to 30", minGuard to supervision; Rises 1x from chair (~5" lower) with heavy minA+1 but with safe sequencing.  Ambulation/Gait Ambulation/Gait assistance: (could likely take a few steps, but appears fatigued and in not giving a report congruent to his presentation; consider next date with chair follow, prior to fatigued state.)  Stairs             Wheelchair Mobility    Modified Rankin (Stroke Patients Only)       Balance Overall balance assessment: Needs assistance         Standing balance support: Bilateral upper extremity  supported;During functional activity Standing balance-Leahy Scale: Fair                              Cognition Arousal/Alertness: Awake/alert(maintains eyes closed for much of session, denies drowsiness, offers no explnation) Behavior During Therapy: Flat affect Overall Cognitive Status: No family/caregiver present to determine baseline cognitive functioning                   Orientation Level: Person;Place;Time;Situation     Following Commands: Follows one step commands with increased time;Follows one step commands inconsistently     Problem Solving: Slow processing;Decreased initiation;Requires verbal cues;Requires tactile cues General Comments: pt remains generally flat affect, drowsy, fatigued, and exerted but denies all of these. Has difficulty with simple commands for exercises in bed (wrong hold time and wrong side); Should consider depression as well as mild cognitive impairment as etiologies, cognition warrants further in-depth screening.      Exercises      General Comments        Pertinent Vitals/Pain Pain Assessment: No/denies pain Pain Descriptors / Indicators: Grimacing(unclear if related to pain or effort, pt denies pain) Pain Intervention(s): Limited activity within patient's tolerance    Home Living                      Prior Function            PT Goals (current goals can now be found in the care plan section) Acute Rehab PT Goals Patient Stated Goal: to go home PT Goal Formulation: With patient Time For Goal Achievement: 10/13/18 Potential to Achieve Goals: Fair Additional Goals Additional Goal #1: Assess OOB mobility and update goals/POC as appropriate once R femoral arterial line is removed and pt medically appropriate for OOB mobility Progress towards PT goals: Progressing toward goals    Frequency    7X/week      PT Plan Current plan remains appropriate    Co-evaluation              AM-PAC PT "6  Clicks" Mobility   Outcome Measure  Help needed turning from your back to your side while in a flat bed without using bedrails?: A Little Help needed moving from lying on your back to sitting on the side of a flat bed without using bedrails?: A Lot Help needed moving to and from a bed to a chair (including a wheelchair)?: A Little Help needed standing up from a chair using your arms (e.g., wheelchair or bedside chair)?: A Little Help needed to walk in hospital room?: A Little Help needed climbing 3-5 steps with a railing? : Total 6 Click Score: 15    End of Session Equipment Utilized During Treatment: Gait belt Activity Tolerance: Patient tolerated treatment well;Other (comment)(mentation/affect?) Patient left: in chair;with chair alarm set;with call bell/phone within reach;Other (comment)(feet elevated) Nurse Communication: Mobility status PT Visit Diagnosis: Other abnormalities of gait and mobility (R26.89);Muscle weakness (generalized) (M62.81);Difficulty in walking, not elsewhere classified (R26.2);Pain Pain - Right/Left: Left Pain - part of body: Knee     Time: 4128-7867 PT Time Calculation (min) (ACUTE ONLY): 52 min  Charges:  $Therapeutic Exercise:  38-52 mins                     2:47 PM, 10/01/18 Etta Grandchild, PT, DPT Physical Therapist - [facility]  [phone] ([place])    [place] C 10/01/2018, 2:47 PM

## 2018-10-01 NOTE — Progress Notes (Signed)
[facility] [place] NAME: Isaac Cross    MR#:  [id number]  DATE OF BIRTH:  May 17, 1960  SUBJECTIVE:   The patient has no complaints except generalized Isaac.  He has good urine output. REVIEW OF SYSTEMS:  Review of Systems  Constitutional: Negative for chills, fever and weight loss.  HENT: Negative for ear discharge, ear pain and nosebleeds.   Eyes: Negative for blurred vision, pain and discharge.  Respiratory: Negative for sputum production, shortness of breath, wheezing and stridor.   Cardiovascular: Negative for chest pain, palpitations, orthopnea and PND.  Gastrointestinal: Negative for abdominal pain, diarrhea, nausea and vomiting.  Genitourinary: Negative for frequency and urgency.  Musculoskeletal: Negative for back pain and joint pain.  Neurological: Positive for Isaac. Negative for sensory change, speech change and focal Isaac.  Psychiatric/Behavioral: Negative for depression and hallucinations. The patient is not nervous/anxious.    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 rate 16, height 6\' 4"  (1.93 m), weight (!) 156 kg, SpO2 94 %.  PHYSICAL EXAMINATION:  Physical Exam   GENERAL:  58 y.o.-year-old patient lying in the bed with no acute distress. Critically ill appearing EYES: Pupils equal, round, reactive to light and accommodation. No scleral icterus.  HEENT: Head atraumatic, normocephalic. Oropharynx and nasopharynx clear.  NECK:  Supple, no jugular venous distention. No thyroid enlargement, no tenderness.  LUNGS: Normal breath sounds bilaterally, no wheezing, rales,rhonchi or crepitation. No use of accessory muscles of respiration.  Decreased  bibasilar breath sounds CARDIOVASCULAR: S1, S2 normal. No murmurs, rubs, or gallops. left IJ dialysis catheter  ABDOMEN: Soft, nontender, nondistended. Bowel sounds present. No organomegaly or mass. Foley+ EXTREMITIES: Swelling of both legs, knees noted with recent surgery.  No  cyanosis, or clubbing.  Both hands are swollen as well. NEUROLOGIC: Cranial nerves II through XII are intact. Moving all extremities well PSYCHIATRIC: patient is alert and oriented x2 SKIN: No obvious rash, lesion, or ulcer.    LABORATORY PANEL:   CBC Recent Labs  Lab 10/01/18 0427  WBC 10.9*  HGB 8.5*  HCT 27.0*  PLT 499*   ------------------------------------------------------------------------------------------------------------------  Chemistries  Recent Labs  Lab 09/29/18 1642  10/01/18 0745  NA  --    < > 134*  K  --    < > 3.4*  CL  --    < > 98  CO2  --    < > 25  GLUCOSE  --    < > 94  BUN  --    < > 37*  CREATININE  --    < > 8.74*  CALCIUM  --    < > 7.9*  MG  --   --  1.7  AST 38  --   --   ALT 22  --   --   ALKPHOS 58  --   --   BILITOT 0.7  --   --    < > = values in this interval not displayed.   ------------------------------------------------------------------------------------------------------------------  Cardiac Enzymes No results for input(s): TROPONINI in the last 168 hours. ------------------------------------------------------------------------------------------------------------------  RADIOLOGY:  No results found.  EKG:   Orders placed or performed  during the hospital encounter of 09/12/18   EKG 12-Lead   EKG 12-Lead   EKG 12-Lead   EKG 12-Lead   EKG 12-Lead   EKG 12-Lead    ASSESSMENT AND PLAN:    Isaac Cross, Isaac Cross, Isaac Cross, Isaac Cross, Isaac Cross, Isaac Cross  1.Sepsis -s/p Recent  bilateral knee replacement surgeries as outpatient and now bilateral knee washouts done this adm. - fluid Cultures are negative so far. - Fungal cultures negative -Appreciate ID consult.   -Patient was on zosyn, doxycycline --now on ceftazidime and daptomycin (last dose 10/31/2018) -completed 7 days of anidulafungin    2. Acute non- Oliguric renal failure-likely ATN from sepsis and hypotension - appreciate nephrology input. -Hemodialysis started 09/22/2018 -Hold lisinopril and metformin and other nephrotoxins.    The patient has good urine output, creatinine is improving.  3. Acute encephalopathy-secondary to metabolic causes, sepsis and uremia. -No focal deficits Improved.  4. Left popliteal vein DVT - was on heparin drip--platelets dropping--IV argatroban--plt count normal--added po coumadin--needs 5 days of bridiging--pharmacy to dose. 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 Diabetes mellitus-2 Continue sliding scale, hold Lantus due to low set of blood sugar 77 this morning.  6. DVT prophylaxis- argatroban drip and coumadin  Anemia of chronic disease.  Stable.  Hypokalemia.  Potassium supplement. Hypomagnesemia.  IV magnesium.  Physical therapy evaluation noted--pt is now agreeable for rehab  I discussed with Dr. Holley Raring. I discussed with his wife.  Generalized Isaac.  PT evaluation suggest CRI. CODE STATUS: Full code  TOTAL TIME TAKING CARE OF THIS PATIENT: 26 minutes.   POSSIBLE D/C IN 3 DAYS, DEPENDING ON CLINICAL CONDITION.   Demetrios Loll M.D on 10/01/2018 at 11:56 AM  Between 7am to 6pm - Pager - [phone] After 6pm go to [email or web address] - password [email or web address] [facility]  Office  [phone]  CC: Primary care physician; Steele Sizer, MD

## 2018-10-01 NOTE — Progress Notes (Signed)
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 Lipitor [Atorvastatin] Hives and Itching  . Other   . Viagra  [Sildenafil Citrate]     vision loss    Patient Measurements: Height: 6\' 4"  (193 cm) Weight: (!) 343 lb 14.7 oz (156 kg) IBW/kg (Calculated) : 86.8 Heparin Dosing Weight:   Vital Signs: Temp: 98.2 F (36.8 C) (06/14 0740) Temp Source: Axillary (06/13 2315) BP: 148/83 (06/14 0740) Pulse Rate: 91 (06/14 0740)  Labs: Recent Labs    09/29/18 0504  09/30/18 0025 09/30/18 0535 09/30/18 0939 10/01/18 0427 10/01/18 0745  HGB 8.6*  --   --  8.4*  --  8.5*  --   HCT 26.8*  --   --  25.7*  --  27.0*  --   PLT 499*  --   --  504*  --  499*  --   APTT 49*   < > 75*  --   --  97* 86*  LABPROT 18.7*  --  24.1*  --   --  27.5*  --   INR 1.6*  --  2.2*  --   --  2.6*  --   CREATININE 10.82*  --   --   --  8.21*  --  8.74*  CKTOTAL 89  --   --   --   --   --   --    < > = values in this interval not displayed.    Estimated Creatinine Clearance: 14.6 mL/min (A) (by C-G formula based on SCr of 8.74 mg/dL (H)).   Medical History: Past Medical History:  Diagnosis Date  . Allergy   . Anxiety   . Arthritis    knees  . Asthma, mild intermittent, well-controlled   . Chronic obstructive asthma ([place])   . COPD (chronic obstructive pulmonary disease) ([place])   . Depression with anxiety   . Diabetes ([place])   . GERD (gastroesophageal reflux disease)   . HBP (high blood pressure)   . High cholesterol   . Renal cyst, right   . Sleep apnea    CPAP  . Swelling     Medications:  Medications Prior to Admission  Medication Sig Dispense Refill Last Dose  . acetaminophen (TYLENOL) 500 MG tablet Take 1 tablet (500 mg total) by mouth every 6 (six) hours as needed. 90 tablet 0 09/12/2018 at 0800  . albuterol (PROVENTIL  HFA;VENTOLIN HFA) 108 (90 Base) MCG/ACT inhaler INHALE 2 PUFFS INTO THE LUNGS 4 (FOUR) TIMES DAILY. 6.7 Inhaler 0 09/12/2018 at 0800  . aspirin 81 MG tablet Take 81 mg by mouth daily.   09/12/2018 at 0800  . atenolol (TENORMIN) 25 MG tablet Take 1 tablet (25 mg total) by mouth every evening. 90 tablet 1 09/11/2018 at 2000  . cephALEXin (KEFLEX) 500 MG capsule Take 500 mg by mouth 3 (three) times daily.   09/12/2018 at 0800  . citalopram (CELEXA) 20 MG tablet Take 1 tablet (20 mg total) by mouth daily. 90 tablet 1 09/12/2018 at 0800  . fluticasone furoate-vilanterol (BREO ELLIPTA) 100-25 MCG/INH AEPB Inhale 1 puff into the lungs daily. 180 each 1 09/12/2018 at 0800  . Multiple Vitamins-Minerals (MENS MULTIVITAMIN PLUS) TABS Take by mouth.   09/12/2018 at 0800  . Omega-3 Fatty Acids (FISH OIL) 1000 MG CAPS Take by mouth daily.   09/12/2018 at  0800  . oxyCODONE-acetaminophen (PERCOCET/ROXICET) 5-325 MG tablet Take 1 tablet by mouth every 4 (four) hours.   09/12/2018 at 0800  . rosuvastatin (CRESTOR) 20 MG tablet TAKE 1 TABLET BY MOUTH EVERY DAY 90 tablet 1 09/12/2018 at 0800  . SENNA-PLUS 8.6-50 MG tablet Take 2 tablets by mouth every morning.   09/12/2018 at 0800  . sulfamethoxazole-trimethoprim (BACTRIM DS) 800-160 MG tablet Take 1 tablet by mouth 2 (two) times a day.   09/12/2018 at 0800  . XARELTO 10 MG TABS tablet Take 10 mg by mouth daily.    09/12/2018 at 0800  . cetirizine (ZYRTEC) 10 MG tablet Take 10 mg by mouth daily.   prn at prn  . diclofenac sodium (VOLTAREN) 1 % GEL APPLY 4 GRAMS TOPICALLY FOUR TIMES A DAY 100 g 2 prn at prn  . lidocaine (XYLOCAINE) 2 % solution Use as directed 15 mLs in the mouth or throat as needed for mouth pain. 100 mL 0 prn at prn  . triamcinolone cream (KENALOG) 0.1 % triamcinolone acetonide 0.1 % topical cream   prn at prn    Assessment: 58 yr male had B/l knee replacements Sep 01, 2018 was admitted with fever, AKI/ Platelet count has been declining since 6/5. He was  started on heparin 6/3. Child Pugh score estimated to be 5-6  Baseline aPTT: 35s 06/6   @1525 :aPTT 91s  plt 102 06/6   @1823 :aPTT 64s       06/6   @2013 :aPTT 63s   06/07 @0330  aPTT 60s  plt   99 06/08 @0540  aPTT 59s  plt 150 06/09 @0326  aPTT 55s  plt 227   INR 2.0  Warfarin 5mg  06/10 @0512  aPTT 71s  plt 274   INR 2.2  Warfarin 5mg  06/11 @ 0253 aPTT 88s plt 373   INR 2.6 warfarin 5mg  06/11 @ 0817 aPTT 80s 6/12 @ 0504 INR: 1.6 APTT 49  06/12 @ 1900 : aPTT 60 6/13 @ 0025 aPTT: 75, INR: 2.2  6/14 @ 0427 aPTT 97, INR 2.6 6/14 @ 0745 aPTT 86   HIT lab resulted 6/8 1740 - Abnormal 1.854, ordered SRA  Goal of Therapy:  aPTT 50-90 seconds Monitor platelets by anticoagulation protocol: Yes INR: 2-3 (however - may elevate higher than this while on Warfarin and Argatroban - it will come back down when argatroban stopped)  Plan:  ARGATROBAN:  aPTT level therapeutic. Argatroban 0.32 mcg/kg/min. Will check aPTT in 2 hours, if therapeutic will switch to daily, CBC with AM labs daily.    Stop date for argatroban 6/14 per cardiology recommendation.    WARFARIN: INR 2.6. Level is therapeutic.  Patient has received 4 doses of warfarin 5mg .   Will continue with Warfarin 5mg  dose this evening x 1 dose  Hgb trending down 9.7>> 8.9>> 8.6>>8.5- continue to monitor.   Recheck INR w/ am labs  Eleonore Chiquito, PharmD, BCPS 10/01/2018 8:31 AM

## 2018-10-01 NOTE — Progress Notes (Signed)
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 Lipitor [Atorvastatin] Hives and Itching  . Other   . Viagra  [Sildenafil Citrate]     vision loss    Patient Measurements: Height: 6\' 4"  (193 cm) Weight: (!) 343 lb 14.7 oz (156 kg) IBW/kg (Calculated) : 86.8  Vital Signs: Temp: 98.2 F (36.8 C) (06/14 0740) BP: 148/83 (06/14 0740) Pulse Rate: 91 (06/14 0740)  Labs: Recent Labs    09/29/18 0504  09/30/18 0025 09/30/18 0535 09/30/18 0939 10/01/18 0427 10/01/18 0745 10/01/18 1041  HGB 8.6*  --   --  8.4*  --  8.5*  --   --   HCT 26.8*  --   --  25.7*  --  27.0*  --   --   PLT 499*  --   --  504*  --  499*  --   --   APTT 49*   < > 75*  --   --  97* 86* 82*  LABPROT 18.7*  --  24.1*  --   --  27.5*  --   --   INR 1.6*  --  2.2*  --   --  2.6*  --   --   CREATININE 10.82*  --   --   --  8.21*  --  8.74*  --   CKTOTAL 89  --   --   --   --   --   --   --    < > = values in this interval not displayed.    Estimated Creatinine Clearance: 14.6 mL/min (A) (by C-G formula based on SCr of 8.74 mg/dL (H)).   Medical History: Past Medical History:  Diagnosis Date  . Allergy   . Anxiety   . Arthritis    knees  . Asthma, mild intermittent, well-controlled   . Chronic obstructive asthma ([place])   . COPD (chronic obstructive pulmonary disease) ([place])   . Depression with anxiety   . Diabetes ([place])   . GERD (gastroesophageal reflux disease)   . HBP (high blood pressure)   . High cholesterol   . Renal cyst, right   . Sleep apnea    CPAP  . Swelling     Medications:  Medications Prior to Admission  Medication Sig Dispense Refill Last Dose  . acetaminophen (TYLENOL) 500 MG tablet Take 1 tablet (500 mg total) by mouth every 6 (six) hours as needed. 90 tablet 0 09/12/2018 at 0800  . albuterol (PROVENTIL HFA;VENTOLIN  HFA) 108 (90 Base) MCG/ACT inhaler INHALE 2 PUFFS INTO THE LUNGS 4 (FOUR) TIMES DAILY. 6.7 Inhaler 0 09/12/2018 at 0800  . aspirin 81 MG tablet Take 81 mg by mouth daily.   09/12/2018 at 0800  . atenolol (TENORMIN) 25 MG tablet Take 1 tablet (25 mg total) by mouth every evening. 90 tablet 1 09/11/2018 at 2000  . cephALEXin (KEFLEX) 500 MG capsule Take 500 mg by mouth 3 (three) times daily.   09/12/2018 at 0800  . citalopram (CELEXA) 20 MG tablet Take 1 tablet (20 mg total) by mouth daily. 90 tablet 1 09/12/2018 at 0800  . fluticasone furoate-vilanterol (BREO ELLIPTA) 100-25 MCG/INH AEPB Inhale 1 puff into the lungs daily. 180 each 1 09/12/2018 at 0800  . Multiple Vitamins-Minerals (MENS MULTIVITAMIN PLUS) TABS Take by mouth.   09/12/2018 at 0800  . Omega-3  Fatty Acids (FISH OIL) 1000 MG CAPS Take by mouth daily.   09/12/2018 at 0800  . oxyCODONE-acetaminophen (PERCOCET/ROXICET) 5-325 MG tablet Take 1 tablet by mouth every 4 (four) hours.   09/12/2018 at 0800  . rosuvastatin (CRESTOR) 20 MG tablet TAKE 1 TABLET BY MOUTH EVERY DAY 90 tablet 1 09/12/2018 at 0800  . SENNA-PLUS 8.6-50 MG tablet Take 2 tablets by mouth every morning.   09/12/2018 at 0800  . sulfamethoxazole-trimethoprim (BACTRIM DS) 800-160 MG tablet Take 1 tablet by mouth 2 (two) times a day.   09/12/2018 at 0800  . XARELTO 10 MG TABS tablet Take 10 mg by mouth daily.    09/12/2018 at 0800  . cetirizine (ZYRTEC) 10 MG tablet Take 10 mg by mouth daily.   prn at prn  . diclofenac sodium (VOLTAREN) 1 % GEL APPLY 4 GRAMS TOPICALLY FOUR TIMES A DAY 100 g 2 prn at prn  . lidocaine (XYLOCAINE) 2 % solution Use as directed 15 mLs in the mouth or throat as needed for mouth pain. 100 mL 0 prn at prn  . triamcinolone cream (KENALOG) 0.1 % triamcinolone acetonide 0.1 % topical cream   prn at prn    Assessment: 58 yr male had B/l knee replacements Sep 01, 2018 was admitted with fever, AKI/ Platelet count has been declining since 6/5. He was started on heparin  6/3. Child Pugh score estimated to be 5-6  Baseline aPTT: 35s 06/6   @1525 :aPTT 91s  plt 102 06/6   @1823 :aPTT 64s       06/6   @2013 :aPTT 63s   06/07 @0330  aPTT 60s  plt   99 06/08 @0540  aPTT 59s  plt 150 06/09 @0326  aPTT 55s  plt 227   INR 2.0  Warfarin 5mg  06/10 @0512  aPTT 71s  plt 274   INR 2.2  Warfarin 5mg  06/11 @ 0253 aPTT 88s plt 373   INR 2.6 warfarin 5mg  06/11 @ 0817 aPTT 80s 6/12 @ 0504 INR: 1.6 APTT 49  06/12 @ 1900 : aPTT 60 6/13 @ 0025 aPTT: 75, INR: 2.2  6/14 @ 0427 aPTT 97, INR 2.6 6/14 @ 0745 aPTT 86  6/14 @ 1041 aPTT 82  HIT lab resulted 6/8 1740 - Abnormal 1.854, ordered SRA  Goal of Therapy:  aPTT 50-90 seconds Monitor platelets by anticoagulation protocol: Yes INR: 2-3 (however - may elevate higher than this while on Warfarin and Argatroban - it will come back down when argatroban stopped)  Plan:  ARGATROBAN:  aPTT level therapeutic x 2. Continue Argatroban 0.32 mcg/kg/min. Will check aPTT with AM labs. CBC with AM labs daily.    Stop date for argatroban 6/14 per cardiology recommendation.    WARFARIN: INR 2.6. Level is therapeutic.  Patient has received 4 doses of warfarin 5mg .   Will continue with Warfarin 5mg  dose this evening x 1 dose  Hgb trending down 9.7>> 8.9>> 8.6>>8.5- continue to monitor.   Recheck INR w/ am labs  Eleonore Chiquito, PharmD, BCPS 10/01/2018 12:20 PM

## 2018-10-01 NOTE — Progress Notes (Signed)
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 Lipitor [Atorvastatin] Hives and Itching  . Other   . Viagra  [Sildenafil Citrate]     vision loss    Patient Measurements: Height: 6\' 4"  (193 cm) Weight: (!) 343 lb 14.7 oz (156 kg) IBW/kg (Calculated) : 86.8 Heparin Dosing Weight:   Vital Signs: Temp: 100.3 F (37.9 C) (06/13 2315) Temp Source: Axillary (06/13 2315) BP: 149/87 (06/13 2316) Pulse Rate: 90 (06/13 2316)  Labs: Recent Labs    09/28/18 0817  09/29/18 0504 09/29/18 1903 09/30/18 0025 09/30/18 0535 09/30/18 0939 10/01/18 0427  HGB  --    < > 8.6*  --   --  8.4*  --  8.5*  HCT  --   --  26.8*  --   --  25.7*  --  27.0*  PLT  --   --  499*  --   --  504*  --  499*  APTT 80*  --  49* 60* 75*  --   --  97*  LABPROT  --   --  18.7*  --  24.1*  --   --  27.5*  INR  --   --  1.6*  --  2.2*  --   --  2.6*  CREATININE 10.19*  --  10.82*  --   --   --  8.21*  --   CKTOTAL  --   --  89  --   --   --   --   --    < > = values in this interval not displayed.    Estimated Creatinine Clearance: 15.5 mL/min (A) (by C-G formula based on SCr of 8.21 mg/dL (H)).   Medical History: Past Medical History:  Diagnosis Date  . Allergy   . Anxiety   . Arthritis    knees  . Asthma, mild intermittent, well-controlled   . Chronic obstructive asthma ([place])   . COPD (chronic obstructive pulmonary disease) ([place])   . Depression with anxiety   . Diabetes ([place])   . GERD (gastroesophageal reflux disease)   . HBP (high blood pressure)   . High cholesterol   . Renal cyst, right   . Sleep apnea    CPAP  . Swelling     Medications:  Medications Prior to Admission  Medication Sig Dispense Refill Last Dose  . acetaminophen (TYLENOL) 500 MG tablet Take 1 tablet (500 mg total) by mouth every 6 (six) hours as needed. 90  tablet 0 09/12/2018 at 0800  . albuterol (PROVENTIL HFA;VENTOLIN HFA) 108 (90 Base) MCG/ACT inhaler INHALE 2 PUFFS INTO THE LUNGS 4 (FOUR) TIMES DAILY. 6.7 Inhaler 0 09/12/2018 at 0800  . aspirin 81 MG tablet Take 81 mg by mouth daily.   09/12/2018 at 0800  . atenolol (TENORMIN) 25 MG tablet Take 1 tablet (25 mg total) by mouth every evening. 90 tablet 1 09/11/2018 at 2000  . cephALEXin (KEFLEX) 500 MG capsule Take 500 mg by mouth 3 (three) times daily.   09/12/2018 at 0800  . citalopram (CELEXA) 20 MG tablet Take 1 tablet (20 mg total) by mouth daily. 90 tablet 1 09/12/2018 at 0800  . fluticasone furoate-vilanterol (BREO ELLIPTA) 100-25 MCG/INH AEPB Inhale 1 puff into the lungs daily. 180 each 1 09/12/2018 at 0800  . Multiple Vitamins-Minerals (MENS MULTIVITAMIN PLUS) TABS Take by  mouth.   09/12/2018 at 0800  . Omega-3 Fatty Acids (FISH OIL) 1000 MG CAPS Take by mouth daily.   09/12/2018 at 0800  . oxyCODONE-acetaminophen (PERCOCET/ROXICET) 5-325 MG tablet Take 1 tablet by mouth every 4 (four) hours.   09/12/2018 at 0800  . rosuvastatin (CRESTOR) 20 MG tablet TAKE 1 TABLET BY MOUTH EVERY DAY 90 tablet 1 09/12/2018 at 0800  . SENNA-PLUS 8.6-50 MG tablet Take 2 tablets by mouth every morning.   09/12/2018 at 0800  . sulfamethoxazole-trimethoprim (BACTRIM DS) 800-160 MG tablet Take 1 tablet by mouth 2 (two) times a day.   09/12/2018 at 0800  . XARELTO 10 MG TABS tablet Take 10 mg by mouth daily.    09/12/2018 at 0800  . cetirizine (ZYRTEC) 10 MG tablet Take 10 mg by mouth daily.   prn at prn  . diclofenac sodium (VOLTAREN) 1 % GEL APPLY 4 GRAMS TOPICALLY FOUR TIMES A DAY 100 g 2 prn at prn  . lidocaine (XYLOCAINE) 2 % solution Use as directed 15 mLs in the mouth or throat as needed for mouth pain. 100 mL 0 prn at prn  . triamcinolone cream (KENALOG) 0.1 % triamcinolone acetonide 0.1 % topical cream   prn at prn    Assessment: 58 yr male had B/l knee replacements Sep 01, 2018 was admitted with fever, AKI/  Platelet count has been declining since 6/5. He was started on heparin 6/3. Child Pugh score estimated to be 5-6  Baseline aPTT: 35s 06/6   @1525 :aPTT 91s  plt 102 06/6   @1823 :aPTT 64s       06/6   @2013 :aPTT 63s   06/07 @0330  aPTT 60s  plt   99 06/08 @0540  aPTT 59s  plt 150 06/09 @0326  aPTT 55s  plt 227   INR 2.0  Warfarin 5mg  06/10 @0512  aPTT 71s  plt 274   INR 2.2  Warfarin 5mg  06/11 @ 0253 aPTT 88s plt 373   INR 2.6 warfarin 5mg  06/11 @ 0817 aPTT 80s 6/12 @ 0504 INR: 1.6 APTT 49  06/12 @ 1900 : aPTT 60 6/13 @ 0025 aPTT: 75, INR: 2.2  6/14 @ 0427 aPTT 97, INR 2.6  HIT lab resulted 6/8 1740 - Abnormal 1.854, ordered SRA  Goal of Therapy:  aPTT 50-90 seconds Monitor platelets by anticoagulation protocol: Yes INR: 2-3 (however - may elevate higher than this while on Warfarin and Argatroban - it will come back down when argatroban stopped)  Plan:  ARGATROBAN:  6/14 AM: aPTT level supratherapeutic. Decrease argatroban 0.32 mcg/kg/min (20% decrease). Will check aPTT in 2 hours, CBC with AM labs daily.    Stop date for argatroban 6/14 per cardiology recommendation.    WARFARIN: INR 2.6. Level is therapeutic.  Patient has received 4 doses of warfarin 5mg .   Will continue with Warfarin 5mg  dose this evening x 1 dose  Hgb trending down 9.7>> 8.9>> 8.6>>8.5- continue to monitor.   Recheck INR w/ am labs  Paulina Fusi, PharmD, BCPS 10/01/2018 5:26 AM

## 2018-10-01 NOTE — Progress Notes (Signed)
[facility]  ROUNDING NOTE   Subjective:  Patient seen at bedside. Urine output was dramatically increased to 5.1 L over the preceding 24 hours. However creatinine still remains high.  Objective:  Vital signs in last 24 hours:  Temp:  [98.2 F (36.8 C)-100.3 F (37.9 C)] 98.2 F (36.8 C) (06/14 0740) Pulse Rate:  [89-94] 91 (06/14 0740) Resp:  [16-21] 16 (06/14 0740) BP: (147-165)/(83-96) 148/83 (06/14 0740) SpO2:  [94 %-98 %] 94 % (06/14 0740)  Weight change:  Filed Weights   09/29/18 0630 09/29/18 1445 09/29/18 1823  Weight: (!) 157.1 kg (!) 157 kg (!) 156 kg    Intake/Output: I/O last 3 completed shifts: In: 727.6 [P.O.:340; I.V.:173.6; IV Piggyback:214] Out: 8051 [Urine:8050; Stool:1]   Intake/Output this shift:  Total I/O In: 366.2 [P.O.:240; I.V.:12.3; IV Piggyback:113.9] Out: 950 [Urine:950]  Physical Exam: General: NAD  Head: Moist oral mucosal membranes  Eyes: Anicteric   Lungs:  Scatterd rhonchi, normal effort  Heart: S1S2 no rubs  Abdomen:  Soft, nontender, obese, distended  Extremities: + peripheral edema  Neurologic: Awake, alert, conversant  Skin: warm        Basic Metabolic Panel: Recent Labs  Lab 09/25/18 0444 09/26/18 1027 09/27/18 0512 09/28/18 0817 09/29/18 0504 09/29/18 1657 09/30/18 0025 09/30/18 0939 10/01/18 0745  NA 142 140 138  --  138  --   --  135 134*  K 3.4* 2.9* 3.4*  --  3.2*  --  3.5 3.3* 3.4*  CL 103 102 101  --  101  --   --  98 98  CO2 24 25 25   --  22  --   --  26 25  GLUCOSE 234* 100* 121*  --  94  --   --  100* 94  BUN 52* 66* 52*  --  61*  --   --  36* 37*  CREATININE 7.57* 9.23* 8.38* 10.19* 10.82*  --   --  8.21* 8.74*  CALCIUM 7.7* 7.7* 7.6*  --  8.1*  --   --  7.7* 7.9*  MG 1.9  --   --   --   --   --   --   --  1.7  PHOS 4.5 4.4  --   --   --  5.3*  --   --   --     Liver Function Tests: Recent Labs  Lab 09/25/18 0444 09/26/18 1027 09/29/18 1642  AST 56*  --  38  ALT 37  --  22   ALKPHOS 64  --  58  BILITOT 0.7  --  0.7  PROT 6.0*  --  5.6*  ALBUMIN 2.0* 1.8* 1.8*   No results for input(s): LIPASE, AMYLASE in the last 168 hours. No results for input(s): AMMONIA in the last 168 hours.  CBC: Recent Labs  Lab 09/27/18 0512 09/28/18 0253 09/29/18 0504 09/30/18 0535 10/01/18 0427  WBC 12.1* 10.4 11.2* 10.2 10.9*  HGB 9.7* 8.9* 8.6* 8.4* 8.5*  HCT 29.7* 27.3* 26.8* 25.7* 27.0*  MCV 83.0 82.0 81.5 81.6 84.1  PLT 274 373 499* 504* 499*    Cardiac Enzymes: Recent Labs  Lab 09/26/18 0326 09/29/18 0504  CKTOTAL 90 89    BNP: Invalid input(s): POCBNP  CBG: Recent Labs  Lab 09/30/18 1218 09/30/18 1726 09/30/18 2106 10/01/18 0740 10/01/18 1142  GLUCAP 116* 150* 110* 94 105*    Microbiology: Results for orders placed or performed during the hospital encounter of 09/12/18  Blood  Culture (routine x 2)     Status: None   Collection Time: 09/12/18  9:56 AM   Specimen: BLOOD LEFT HAND  Result Value Ref Range Status   Specimen Description BLOOD LEFT HAND  Final   Special Requests   Final    BOTTLES DRAWN AEROBIC AND ANAEROBIC Blood Culture adequate volume   Culture   Final    NO GROWTH 5 DAYS Performed at [facility], [address] [place], [place] [address]    Report Status 09/17/2018 FINAL  Final  Urine culture     Status: None   Collection Time: 09/12/18 10:01 AM   Specimen: Urine, Random  Result Value Ref Range Status   Specimen Description   Final    URINE, RANDOM Performed at [facility], [address] [place], [place] [address]    Special Requests   Final    NONE Performed at [facility], [address], [place], [place] [address]    Culture   Final    NO GROWTH Performed at [facility], [place] [address], [place], [place] [address]    Report Status 09/13/2018 FINAL  Final  SARS Coronavirus 2 (CEPHEID- Performed in [place] hospital lab), Hosp Order     Status: None   Collection Time:  09/12/18 10:02 AM   Specimen: Nasopharyngeal Swab  Result Value Ref Range Status   SARS Coronavirus 2 NEGATIVE NEGATIVE Final    Comment: (NOTE) If result is NEGATIVE SARS-CoV-2 target nucleic acids are NOT DETECTED. The SARS-CoV-2 RNA is generally detectable in upper and lower  respiratory specimens during the acute phase of infection. The lowest  concentration of SARS-CoV-2 viral copies this assay can detect is 250  copies / mL. A negative result does not preclude SARS-CoV-2 infection  and should not be used as the sole basis for treatment or other  patient management decisions.  A negative result may occur with  improper specimen collection / handling, submission of specimen other  than nasopharyngeal swab, presence of viral mutation(s) within the  areas targeted by this assay, and inadequate number of viral copies  (<250 copies / mL). A negative result must be combined with clinical  observations, patient history, and epidemiological information. If result is POSITIVE SARS-CoV-2 target nucleic acids are DETECTED. The SARS-CoV-2 RNA is generally detectable in upper and lower  respiratory specimens dur ing the acute phase of infection.  Positive  results are indicative of active infection with SARS-CoV-2.  Clinical  correlation with patient history and other diagnostic information is  necessary to determine patient infection status.  Positive results do  not rule out bacterial infection or co-infection with other viruses. If result is PRESUMPTIVE POSTIVE SARS-CoV-2 nucleic acids MAY BE PRESENT.   A presumptive positive result was obtained on the submitted specimen  and confirmed on repeat testing.  While 2019 novel coronavirus  (SARS-CoV-2) nucleic acids may be present in the submitted sample  additional confirmatory testing may be necessary for epidemiological  and / or clinical management purposes  to differentiate between  SARS-CoV-2 and other Sarbecovirus currently known to  infect humans.  If clinically indicated additional testing with an alternate test  methodology [phone]) is advised. The SARS-CoV-2 RNA is generally  detectable in upper and lower respiratory sp ecimens during the acute  phase of infection. The expected result is Negative. Fact Sheet for Patients:  StrictlyIdeas.no Fact Sheet for Healthcare Providers: BankingDealers.co.za This test is not yet approved or cleared by the [place]  States FDA and has been authorized for detection and/or diagnosis of SARS-CoV-2 by FDA under an Emergency Use Authorization (EUA).  This EUA will remain in effect (meaning this test can be used) for the duration of the COVID-19 declaration under Section 564(b)(1) of the Act, 21 U.S.C. section 360bbb-3(b)(1), unless the authorization is terminated or revoked sooner. Performed at [facility], [address] [place], [place] [address]   Blood Culture (routine x 2)     Status: None   Collection Time: 09/12/18 10:06 AM   Specimen: BLOOD  Result Value Ref Range Status   Specimen Description BLOOD LEFT ANTECUBITAL  Final   Special Requests   Final    BOTTLES DRAWN AEROBIC AND ANAEROBIC Blood Culture adequate volume   Culture   Final    NO GROWTH 5 DAYS Performed at [facility], [address] [place], [place] [address]    Report Status 09/17/2018 FINAL  Final  MRSA PCR Screening     Status: None   Collection Time: 09/12/18  7:04 PM   Specimen: Nasal Mucosa; Nasopharyngeal  Result Value Ref Range Status   MRSA by PCR NEGATIVE NEGATIVE Final    Comment:        The GeneXpert MRSA Assay (FDA approved for NASAL specimens only), is one component of a comprehensive MRSA colonization surveillance program. It is not intended to diagnose MRSA infection nor to guide or monitor treatment for MRSA infections. Performed at [facility], [address] [place], [place] [address]   C  difficile quick scan w PCR reflex     Status: None   Collection Time: 09/14/18  2:21 PM   Specimen: STOOL  Result Value Ref Range Status   C Diff antigen NEGATIVE NEGATIVE Final   C Diff toxin NEGATIVE NEGATIVE Final   C Diff interpretation No C. difficile detected.  Final    Comment: Performed at [facility], [address] [place], [place] [address]  Group A Strep by PCR     Status: None   Collection Time: 09/15/18  1:53 PM  Result Value Ref Range Status   Group A Strep by PCR NOT DETECTED NOT DETECTED Final    Comment: Performed at [facility], [address] [place] [address], [place], [place] [address]  Body fluid culture     Status: None   Collection Time: 09/15/18  5:13 PM   Specimen: KNEE  Result Value Ref Range Status   Specimen Description   Final    KNEE Performed at [facility], [address], [place], [place] [address]    Special Requests   Final    NONE Performed at [facility], [address] [place], [place] [address]    Gram Stain   Final    RARE RBCS FEW WBC SEEN NO ORGANISMS SEEN Performed at [facility], [address], [place], [place] [address]    Culture   Final    NO GROWTH 3 DAYS Performed at [facility], [place] [address] [place],  [address]    Report Status 09/19/2018 FINAL  Final  Body fluid culture     Status: None   Collection Time: 09/16/18  8:55 AM   Specimen: KNEE  Result Value Ref Range Status   Specimen Description KNEE RIGHT  Final   Special Requests NONE  Final   Gram Stain   Final    RARE WBC PRESENT, PREDOMINANTLY PMN NO ORGANISMS SEEN    Culture  Final    NO GROWTH 3 DAYS Performed at [facility], [place] [address], [place], [place] [address]    Report Status 09/20/2018 FINAL  Final  Aerobic/Anaerobic Culture (surgical/deep wound)     Status: None   Collection Time: 09/17/18 11:32 AM   Specimen: Wound  Result Value Ref Range Status   Specimen Description   Final     TISSUE LEFT KNEE 1 Performed at [facility], [address] [place], [place] [address]    Special Requests PATIENT ON FOLLOWING MAXIPIME VANCOMYCIN  Final   Gram Stain   Final    FEW WBC PRESENT,BOTH PMN AND MONONUCLEAR NO ORGANISMS SEEN    Culture   Final    No growth aerobically or anaerobically. Performed at [facility], [place] [address] [place], [place] [address]    Report Status 09/22/2018 FINAL  Final  Acid Fast Smear (AFB)     Status: None   Collection Time: 09/17/18 11:32 AM   Specimen: Synovium  Result Value Ref Range Status   AFB Specimen Processing Comment  Final    Comment: Tissue Grinding and Digestion/Decontamination   Acid Fast Smear Negative  Final    Comment: (NOTE) Performed At: [facility] [place], [place] [id number] Rush Farmer MD GG:[id number]    Source (AFB) TISSUE  Final    Comment: LEFT KNEE Performed at [facility], [place] [address] [place], [place] [address]   Culture, fungus without smear     Status: None (Preliminary result)   Collection Time: 09/17/18 11:34 AM   Specimen: Synovium; Other  Result Value Ref Range Status   Specimen Description   Final    SYNOVIAL Performed at [facility], [address] [place], [place] [address]    Special Requests   Final    NONE Performed at [facility], [address] [place] [address], [place], [place] [address]    Culture   Final    NO FUNGUS ISOLATED AFTER 11 DAYS Performed at [facility], [place] [address], [place], [place] [address]    Report Status PENDING  Incomplete  Acid Fast Culture with reflexed sensitivities     Status: None   Collection Time: 09/17/18 11:36 AM   Specimen: Synovium  Result Value Ref Range Status   Acid Fast Culture CANC  Final    Comment: (NOTE) Test cancelled at client's request.      Cancelled per Levin Erp 09/21/2018 Performed At: [facility] [place], [place] [id number] Rush Farmer MD [id number]    Source of Sample TISSUE  Final    Comment: RIGHT KNEE Performed at [facility], [place] [address], [place], [place] [address]   Aerobic/Anaerobic Culture (surgical/deep wound)     Status: None   Collection Time: 09/17/18 11:43 AM   Specimen: Wound  Result Value Ref Range Status   Specimen Description   Final    TISSUE LEFT KNEE 2 Performed at [facility], [address] [place], [place] [address]    Special Requests PATIENT ON FOLLOWING MAXIPIME VANCOMYCIN  Final   Gram Stain   Final    FEW WBC PRESENT, PREDOMINANTLY PMN NO ORGANISMS SEEN    Culture   Final    No growth aerobically or anaerobically. Performed at [facility], [place] [address], [place], [place] [address]    Report Status 09/22/2018 FINAL  Final  Aerobic/Anaerobic Culture (surgical/deep wound)  Status: None   Collection Time: 09/17/18 11:44 AM   Specimen: Wound  Result Value Ref Range Status   Specimen Description   Final    TISSUE LEFT KNEE 3 Performed at [facility], [address] [place], [place] [address]    Special Requests PATIENT ON FOLLOWING MAXIPIME VANCOMYCIN  Final   Gram Stain   Final    MODERATE WBC PRESENT, PREDOMINANTLY PMN NO ORGANISMS SEEN    Culture   Final    No growth aerobically or anaerobically. Performed at [facility], [place] [address], [place], [place] [address]    Report Status 09/22/2018 FINAL  Final  Aerobic/Anaerobic Culture (surgical/deep wound)     Status: None   Collection Time: 09/18/18  9:43 PM   Specimen: [facility] Other; Tissue  Result Value Ref Range Status   Specimen Description   Final    SYNOVIAL Performed at [facility], [address] [place], [place] [address]    Special Requests   Final    NONE Performed at [facility], [address] [place], [place] [address]    Gram Stain   Final    RARE WBC PRESENT, PREDOMINANTLY MONONUCLEAR NO ORGANISMS SEEN    Culture   Final     No growth aerobically or anaerobically. Performed at [facility], [place] [address] [place], [place] [address]    Report Status 09/24/2018 FINAL  Final  Aerobic/Anaerobic Culture (surgical/deep wound)     Status: None   Collection Time: 09/18/18  9:44 PM   Specimen: [facility] Other; Tissue  Result Value Ref Range Status   Specimen Description   Final    SYNOVIAL Performed at [facility], [address] [place], [place] [address]    Special Requests   Final    RIGHT KNEE Performed at [facility], [place], [place] [address]    Gram Stain   Final    FEW WBC PRESENT,BOTH PMN AND MONONUCLEAR NO ORGANISMS SEEN    Culture   Final    No growth aerobically or anaerobically. Performed at [facility], [place] [address], [place], [place] [address]    Report Status 09/24/2018 FINAL  Final  Aerobic/Anaerobic Culture (surgical/deep wound)     Status: None   Collection Time: 09/18/18  9:45 PM   Specimen: [facility] Other; Tissue  Result Value Ref Range Status   Specimen Description   Final    SYNOVIAL Performed at [facility], [address], [place], [place] [address]    Special Requests   Final    RIGHT KNEE Performed at [facility], [address] [place], [place] [address]    Gram Stain   Final    FEW WBC PRESENT, PREDOMINANTLY MONONUCLEAR NO ORGANISMS SEEN    Culture   Final    No growth aerobically or anaerobically. Performed at [facility], [place] [address], [place], [place] [address]    Report Status 09/24/2018 FINAL  Final  Culture, blood (Routine X 2) w Reflex to ID Panel     Status: None   Collection Time: 09/19/18  1:11 PM   Specimen: BLOOD  Result Value Ref Range Status   Specimen Description BLOOD LEFT ANTECUBITAL  Final   Special Requests   Final    BOTTLES DRAWN AEROBIC AND ANAEROBIC Blood Culture results may not be optimal due to an excessive volume of blood received in culture bottles   Culture  Final    NO GROWTH 5 DAYS Performed at [facility], [address] [place], [place] [address]    Report Status 09/24/2018 FINAL  Final  Culture, blood (Routine X 2) w Reflex to ID Panel     Status: None   Collection Time: 09/19/18  1:20 PM   Specimen: BLOOD  Result Value Ref Range Status   Specimen Description BLOOD BLOOD RIGHT HAND  Final   Special Requests   Final    BOTTLES DRAWN AEROBIC AND ANAEROBIC Blood Culture results may not be optimal due to an excessive volume of blood received in culture bottles   Culture   Final    NO GROWTH 5 DAYS Performed at [facility], [address], [place], [place] [address]    Report Status 09/24/2018 FINAL  Final  Urine Culture     Status: None   Collection Time: 09/20/18  3:59 PM   Specimen: Urine, Random  Result Value Ref Range Status   Specimen Description   Final    URINE, RANDOM Performed at [facility], [address] [place], [place] [address]    Special Requests   Final    NONE Performed at [facility], [address], [place], [place] [address]    Culture   Final    NO GROWTH Performed at [facility], [place] [address] [place], [place] [address]    Report Status 09/21/2018 FINAL  Final  C difficile quick scan w PCR reflex     Status: None   Collection Time: 09/21/18  3:27 PM   Specimen: STOOL  Result Value Ref Range Status   C Diff antigen NEGATIVE NEGATIVE Final   C Diff toxin NEGATIVE NEGATIVE Final   C Diff interpretation No C. difficile detected.  Final    Comment: Performed at [facility], [address] [place], [place] [address]  Gastrointestinal Panel by PCR , Stool     Status: None   Collection Time: 09/21/18  3:27 PM   Specimen: Stool  Result Value Ref Range Status   Campylobacter species NOT DETECTED NOT DETECTED Final   Plesimonas shigelloides NOT DETECTED NOT DETECTED Final   Salmonella species NOT DETECTED NOT DETECTED Final   Yersinia  enterocolitica NOT DETECTED NOT DETECTED Final   Vibrio species NOT DETECTED NOT DETECTED Final   Vibrio cholerae NOT DETECTED NOT DETECTED Final   Enteroaggregative E coli (EAEC) NOT DETECTED NOT DETECTED Final   Enteropathogenic E coli (EPEC) NOT DETECTED NOT DETECTED Final   Enterotoxigenic E coli (ETEC) NOT DETECTED NOT DETECTED Final   Shiga like toxin producing E coli (STEC) NOT DETECTED NOT DETECTED Final   Shigella/Enteroinvasive E coli (EIEC) NOT DETECTED NOT DETECTED Final   Cryptosporidium NOT DETECTED NOT DETECTED Final   Cyclospora cayetanensis NOT DETECTED NOT DETECTED Final   Entamoeba histolytica NOT DETECTED NOT DETECTED Final   Giardia lamblia NOT DETECTED NOT DETECTED Final   Adenovirus F40/41 NOT DETECTED NOT DETECTED Final   Astrovirus NOT DETECTED NOT DETECTED Final   Norovirus GI/GII NOT DETECTED NOT DETECTED Final   Rotavirus A NOT DETECTED NOT DETECTED Final   Sapovirus (I, II, IV, and V) NOT DETECTED NOT DETECTED Final    Comment: Performed at [facility], [address] [place], [place] [address]    Coagulation Studies: Recent Labs    09/29/18 0504 09/30/18 0025 10/01/18 0427  LABPROT 18.7* 24.1* 27.5*  INR 1.6* 2.2* 2.6*    Urinalysis: No  results for input(s): COLORURINE, LABSPEC, [place], GLUCOSEU, HGBUR, BILIRUBINUR, KETONESUR, PROTEINUR, UROBILINOGEN, NITRITE, LEUKOCYTESUR in the last 72 hours.  Invalid input(s): APPERANCEUR    Imaging: No results found.   Medications:   . sodium chloride 5 mL/hr at 09/29/18 0242  . sodium chloride 0 mL/hr at 09/30/18 1534  . anticoagulant sodium citrate    . argatroban 0.32 mcg/kg/min (10/01/18 0720)  . cefTAZidime (FORTAZ)  IV 1 g (10/01/18 0020)  . DAPTOmycin (CUBICIN)  IV Stopped (09/29/18 2018)   . aspirin EC  81 mg Oral Daily  . atenolol  25 mg Oral Daily  . Chlorhexidine Gluconate Cloth  6 each Topical Q0600  . citalopram  20 mg Oral Daily  . feeding supplement (NEPRO CARB STEADY)   237 mL Oral BID BM  . fluticasone furoate-vilanterol  1 puff Inhalation Daily  . folic acid  1 mg Oral Daily  . insulin aspart  0-5 Units Subcutaneous QHS  . insulin aspart  0-9 Units Subcutaneous TID WC  . mouth rinse  15 mL Mouth Rinse BID  . multivitamin  1 tablet Oral Q1200  . multivitamin with minerals  1 tablet Oral Daily  . omega-3 acid ethyl esters  1 g Oral Daily  . rosuvastatin  20 mg Oral Daily  . sodium chloride flush  10-40 mL Intracatheter Q12H  . sodium chloride flush  3 mL Intravenous Q12H  . warfarin  5 mg Oral ONCE-1800  . Warfarin - Pharmacist Dosing Inpatient   Does not apply q1800   sodium chloride, sodium chloride, acetaminophen **OR** acetaminophen, albuterol, alum & mag hydroxide-simeth, anticoagulant sodium citrate, fentaNYL (SUBLIMAZE) injection, HYDROcodone-acetaminophen, iohexol, ipratropium-albuterol, menthol-cetylpyridinium **OR** phenol, menthol-cetylpyridinium **OR** phenol, metoCLOPramide **OR** metoCLOPramide (REGLAN) injection, ondansetron **OR** ondansetron (ZOFRAN) IV, sodium chloride flush, sodium chloride flush  Assessment/ Plan:  [name] is a 58 y.o. black male with hyeprtension, COPD, diabetes mellitus type II who underwent bilateral knee replacement surgeries last week. who was admitted to [facility] on 09/12/2018 for sepsis  1. Acute renal failure with metabolic acidosis  baseline creatinine of 1.07 with normal GFR in 06/12/18.  Likely severe ATN from hypotension and sepsis -Patient appears to be in the post oliguric phase of ATN.  He made 5.1 L of urine yesterday.  Therefore no urgent indication for dialysis.  Creatinine however remains high.  Continue to monitor renal function trend.  Encourage the patient to drink plenty of water given high urine output.  Patient verbalized understanding of this.  2. Septic shock/hypotension  -Recent bilateral knee replacement on Sep 01, 2018 at [facility]  -  knee septic arthritis, patient underwent I&D and  polyethylene exchange on 5/31 and Rt knee on 6/1 -abx management per ID/hospitalist.   3. Hypokalemia -Patient received additional potassium chloride yesterday intravenous.  Potassium currently 3.4.  Continue to monitor closely.       LOS: 19 Lonie Newsham 6/14/20202:31 PM

## 2018-10-02 LAB — BASIC METABOLIC PANEL
Anion gap: 14 (ref 5–15)
BUN: 36 mg/dL — ABNORMAL HIGH (ref 6–20)
CO2: 24 mmol/L (ref 22–32)
Calcium: 8.3 mg/dL — ABNORMAL LOW (ref 8.9–10.3)
Chloride: 99 mmol/L (ref 98–111)
Creatinine, Ser: 8.63 mg/dL — ABNORMAL HIGH (ref 0.61–1.24)
GFR calc Af Amer: 7 mL/min — ABNORMAL LOW (ref >60)
GFR calc non Af Amer: 6 mL/min — ABNORMAL LOW (ref >60)
Glucose, Bld: 106 mg/dL — ABNORMAL HIGH (ref 70–99)
Potassium: 3.4 mmol/L — ABNORMAL LOW (ref 3.5–5.1)
Sodium: 137 mmol/L (ref 135–145)

## 2018-10-02 LAB — CBC
HCT: 27.1 % — ABNORMAL LOW (ref 39.0–52.0)
Hemoglobin: 8.7 g/dL — ABNORMAL LOW (ref 13.0–17.0)
MCH: 26.2 pg (ref 26.0–34.0)
MCHC: 32.1 g/dL (ref 30.0–36.0)
MCV: 81.6 fL (ref 80.0–100.0)
Platelets: 503 10*3/uL — ABNORMAL HIGH (ref 150–400)
RBC: 3.32 MIL/uL — ABNORMAL LOW (ref 4.22–5.81)
RDW: 15.3 % (ref 11.5–15.5)
WBC: 10.3 10*3/uL (ref 4.0–10.5)
nRBC: 0 % (ref 0.0–0.2)

## 2018-10-02 LAB — PROTEIN ELECTROPHORESIS, SERUM
A/G Ratio: 0.6 — ABNORMAL LOW (ref 0.7–1.7)
Albumin ELP: 2 g/dL — ABNORMAL LOW (ref 2.9–4.4)
Alpha-1-Globulin: 0.4 g/dL (ref 0.0–0.4)
Alpha-2-Globulin: 0.9 g/dL (ref 0.4–1.0)
Beta Globulin: 0.5 g/dL — ABNORMAL LOW (ref 0.7–1.3)
Gamma Globulin: 1.3 g/dL (ref 0.4–1.8)
Globulin, Total: 3.2 g/dL (ref 2.2–3.9)
Total Protein ELP: 5.2 g/dL — ABNORMAL LOW (ref 6.0–8.5)

## 2018-10-02 LAB — PROTIME-INR
INR: 2.8 — ABNORMAL HIGH (ref 0.8–1.2)
Prothrombin Time: 29.3 seconds — ABNORMAL HIGH (ref 11.4–15.2)

## 2018-10-02 LAB — GLUCOSE, CAPILLARY
Glucose-Capillary: 89 mg/dL (ref 70–99)
Glucose-Capillary: 82 mg/dL (ref 70–99)
Glucose-Capillary: 94 mg/dL (ref 70–99)
Glucose-Capillary: 81 mg/dL (ref 70–99)

## 2018-10-02 LAB — APTT
aPTT: 103 seconds — ABNORMAL HIGH (ref 24–36)
aPTT: 85 seconds — ABNORMAL HIGH (ref 24–36)
aPTT: 80 seconds — ABNORMAL HIGH (ref 24–36)

## 2018-10-02 LAB — CK: Total CK: 114 U/L (ref 49–397)

## 2018-10-02 LAB — MAGNESIUM: Magnesium: 1.9 mg/dL (ref 1.7–2.4)

## 2018-10-02 LAB — CORTISOL-AM, BLOOD: Cortisol - AM: 4.1 ug/dL — ABNORMAL LOW (ref 6.7–22.6)

## 2018-10-02 MED ORDER — WARFARIN SODIUM 7.5 MG PO TABS
7.5000 mg | ORAL_TABLET | Freq: Once | ORAL | Status: AC
  Administered 2018-10-02: 7.5 mg via ORAL
  Filled 2018-10-02: qty 1

## 2018-10-02 MED ORDER — POTASSIUM CHLORIDE CRYS ER 20 MEQ PO TBCR
40.0000 meq | EXTENDED_RELEASE_TABLET | Freq: Once | ORAL | Status: AC
  Administered 2018-10-02: 40 meq via ORAL
  Filled 2018-10-02: qty 2

## 2018-10-02 MED ORDER — WARFARIN SODIUM 4 MG PO TABS
4.0000 mg | ORAL_TABLET | Freq: Once | ORAL | Status: DC
  Filled 2018-10-02: qty 1

## 2018-10-02 NOTE — Progress Notes (Addendum)
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 Lipitor [Atorvastatin] Hives and Itching  . Other   . Viagra  [Sildenafil Citrate]     vision loss    Patient Measurements: Height: 6\' 4"  (193 cm) Weight: (!) 343 lb 14.7 oz (156 kg) IBW/kg (Calculated) : 86.8  Vital Signs: Temp: 100.2 F (37.9 C) (06/15 0728) Temp Source: Oral (06/15 0728) BP: 144/92 (06/15 0728) Pulse Rate: 93 (06/15 0728)  Labs: Recent Labs    09/30/18 0025  09/30/18 0535 09/30/18 6045 10/01/18 0427 10/01/18 0745  10/02/18 0506 10/02/18 0800 10/02/18 1037  HGB  --    < > 8.4*  --  8.5*  --   --  8.7*  --   --   HCT  --   --  25.7*  --  27.0*  --   --  27.1*  --   --   PLT  --   --  504*  --  499*  --   --  503*  --   --   APTT 75*  --   --   --  97* 86*   < > 103* 85* 80*  LABPROT 24.1*  --   --   --  27.5*  --   --  29.3*  --   --   INR 2.2*  --   --   --  2.6*  --   --  2.8*  --   --   CREATININE  --   --   --  8.21*  --  8.74*  --  8.63*  --   --   CKTOTAL  --   --   --   --   --   --   --  114  --   --    < > = values in this interval not displayed.    Estimated Creatinine Clearance: 14.7 mL/min (A) (by C-G formula based on SCr of 8.63 mg/dL (H)).   Medical History: Past Medical History:  Diagnosis Date  . Allergy   . Anxiety   . Arthritis    knees  . Asthma, mild intermittent, well-controlled   . Chronic obstructive asthma ([place])   . COPD (chronic obstructive pulmonary disease) ([place])   . Depression with anxiety   . Diabetes ([place])   . GERD (gastroesophageal reflux disease)   . HBP (high blood pressure)   . High cholesterol   . Renal cyst, right   . Sleep apnea    CPAP  . Swelling     Medications:  Medications Prior to Admission  Medication Sig Dispense Refill Last Dose  . acetaminophen (TYLENOL) 500 MG tablet Take 1  tablet (500 mg total) by mouth every 6 (six) hours as needed. 90 tablet 0 09/12/2018 at 0800  . albuterol (PROVENTIL HFA;VENTOLIN HFA) 108 (90 Base) MCG/ACT inhaler INHALE 2 PUFFS INTO THE LUNGS 4 (FOUR) TIMES DAILY. 6.7 Inhaler 0 09/12/2018 at 0800  . aspirin 81 MG tablet Take 81 mg by mouth daily.   09/12/2018 at 0800  . atenolol (TENORMIN) 25 MG tablet Take 1 tablet (25 mg total) by mouth every evening. 90 tablet 1 09/11/2018 at 2000  . cephALEXin (KEFLEX) 500 MG capsule Take 500 mg by mouth 3 (three) times daily.   09/12/2018 at 0800  . citalopram (CELEXA) 20 MG tablet Take  1 tablet (20 mg total) by mouth daily. 90 tablet 1 09/12/2018 at 0800  . fluticasone furoate-vilanterol (BREO ELLIPTA) 100-25 MCG/INH AEPB Inhale 1 puff into the lungs daily. 180 each 1 09/12/2018 at 0800  . Multiple Vitamins-Minerals (MENS MULTIVITAMIN PLUS) TABS Take by mouth.   09/12/2018 at 0800  . Omega-3 Fatty Acids (FISH OIL) 1000 MG CAPS Take by mouth daily.   09/12/2018 at 0800  . oxyCODONE-acetaminophen (PERCOCET/ROXICET) 5-325 MG tablet Take 1 tablet by mouth every 4 (four) hours.   09/12/2018 at 0800  . rosuvastatin (CRESTOR) 20 MG tablet TAKE 1 TABLET BY MOUTH EVERY DAY 90 tablet 1 09/12/2018 at 0800  . SENNA-PLUS 8.6-50 MG tablet Take 2 tablets by mouth every morning.   09/12/2018 at 0800  . sulfamethoxazole-trimethoprim (BACTRIM DS) 800-160 MG tablet Take 1 tablet by mouth 2 (two) times a day.   09/12/2018 at 0800  . XARELTO 10 MG TABS tablet Take 10 mg by mouth daily.    09/12/2018 at 0800  . cetirizine (ZYRTEC) 10 MG tablet Take 10 mg by mouth daily.   prn at prn  . diclofenac sodium (VOLTAREN) 1 % GEL APPLY 4 GRAMS TOPICALLY FOUR TIMES A DAY 100 g 2 prn at prn  . lidocaine (XYLOCAINE) 2 % solution Use as directed 15 mLs in the mouth or throat as needed for mouth pain. 100 mL 0 prn at prn  . triamcinolone cream (KENALOG) 0.1 % triamcinolone acetonide 0.1 % topical cream   prn at prn    Assessment: 58 yr male had B/l  knee replacements Sep 01, 2018 was admitted with fever, AKI/ Platelet count has been declining since 6/5. He was started on heparin 6/3. Child Pugh score estimated to be 5-6  Baseline aPTT: 35s 06/6   @1525 :aPTT 91s  plt 102 06/6   @1823 :aPTT 64s       06/6   @2013 :aPTT 63s   06/07 @0330  aPTT 60s  plt   99 06/08 @0540  aPTT 59s  plt 150 06/09 @0326  aPTT 55s  plt 227   INR 2.0  Warfarin 5mg  06/10 @0512  aPTT 71s  plt 274   INR 2.2  Warfarin 5mg  06/11 @ 0253 aPTT 88s plt 373   INR 2.6 warfarin 5mg  06/11 @ 0817 aPTT 80s 6/12 @ 0504 INR: 1.6 APTT 49  06/12 @ 1900 : aPTT 60 6/13 @ 0025 aPTT: 75, INR: 2.2  6/14 @ 0427 aPTT 97, INR 2.6 6/14 @ 0745 aPTT 86  6/14 @ 1041 aPTT 82 6/15 @ 0506 aPTT 103, INR 2.8 6/15 @ 0800 aPTT 85 6/15 @ 1037 aPTT 80  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 Will continue current dosing and check aPTT and CBC with AM labs daily.    Stop date for argatroban 6/14 per cardiology recommendation.    WARFARIN: INR 2.8. Level is therapeutic but trending up.    Will order Warfarin 7.5mg  dose this evening x 1 dose  Recheck INR w/ am labs  [name], PharmD Clinical Pharmacist 10/02/2018 12:12 PM

## 2018-10-02 NOTE — Progress Notes (Signed)
Physical Therapy Treatment Patient Details Name: Isaac Cross MRN: [id number] DOB: 03/28/1961 Today's Date: 10/02/2018    History of Present Illness Pt is a 58 y.o. male presenting to hospital 09/12/18 after rolling OOB landing on L knee; pt with increased dizziness x2 days, fevers x3 days, and generalized weakness.  S/p B TKR 09/01/18 in [place] (pt reports discharging home same day).  Pt admitted with sepsis, acute renal failure, and B TKR.  Pt s/p L knee 5/31 and R knee 6/1 I&D with poly exchange.  Pt noted with severe encephalopathy from DT's with extensive ETOH abuse.  L LE DVT noted.  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  HD started 6/5 with L IJ temporary HD catheter.  PMH includes B TKR 09/01/18 in [place], DM, asthma, R vein surgery, COPD.    PT Comments    Pt sleeping upon arrival.  Awoke to gentle voice.  Initially declining to participate but agreed with strong encouragement to supine exercises.  Participated in exercises as described below.  Pt continued to decline OOB this am.  Discussed with PT team leader to see if someone could come back later for OOB as time allows.  Discussed with OT too who will see pt later and encourage OOB.      Follow Up Recommendations  CIR     Equipment Recommendations       Recommendations for Other Services       Precautions / Restrictions Precautions Precautions: Fall Precaution Comments: L IJ temporary HD catheter (Per discussion with Dr. Holley Raring and nephrology note 09/27/18 "Ok for patient to work with PT out of bed with temporary IJ dialysis catheter in place". Restrictions Weight Bearing Restrictions: Yes RLE Weight Bearing: Weight bearing as tolerated LLE Weight Bearing: Weight bearing as tolerated    Mobility  Bed Mobility               General bed mobility comments: refused OOB this am.  Pt was sleeping upon arrival.  Transfers                    Ambulation/Gait                 Stairs              Wheelchair Mobility    Modified Rankin (Stroke Patients Only)       Balance                                            Cognition Arousal/Alertness: Awake/alert Behavior During Therapy: WFL for tasks assessed/performed Overall Cognitive Status: No family/caregiver present to determine baseline cognitive functioning                                 General Comments: pt remains generally flat affect, drowsy, fatigued, and exerted but denies all of these. Has difficulty with simple commands for exercises in bed (wrong hold time and wrong side);      Exercises Other Exercises Other Exercises: 2 x 10 repsA/ AAROM for ankle pumps, quad sets , SLR, heel slides, ab/add and SAQ    General Comments        Pertinent Vitals/Pain Pain Assessment: No/denies pain    Home Living  Prior Function            PT Goals (current goals can now be found in the care plan section) Progress towards PT goals: Progressing toward goals    Frequency    7X/week      PT Plan Current plan remains appropriate    Co-evaluation              AM-PAC PT "6 Clicks" Mobility   Outcome Measure  Help needed turning from your back to your side while in a flat bed without using bedrails?: A Little Help needed moving from lying on your back to sitting on the side of a flat bed without using bedrails?: A Lot Help needed moving to and from a bed to a chair (including a wheelchair)?: A Little Help needed standing up from a chair using your arms (e.g., wheelchair or bedside chair)?: A Little Help needed to walk in hospital room?: A Little Help needed climbing 3-5 steps with a railing? : Total 6 Click Score: 15    End of Session Equipment Utilized During Treatment: Gait belt Activity Tolerance: Patient tolerated treatment well Patient left: in bed;with call bell/phone within reach;with bed alarm set;with nursing/sitter in room    Pain - Right/Left: Left Pain - part of body: Knee     Time: 7289-7915 PT Time Calculation (min) (ACUTE ONLY): 27 min  Charges:  $Therapeutic Exercise: 23-37 mins                    Chesley Noon, PTA 10/02/18, 10:17 AM

## 2018-10-02 NOTE — Progress Notes (Signed)
   10/02/18 1400  Clinical Encounter Type  Visited With Patient  Visit Type Follow-up  Stress Factors  Patient Stress Factors Health changes (Extended hospital stay; expressed a spritual need to be home)   Chaplain visited the patient re: length of stay. Upon arrival, the patient was sitting up in his bed with his eyes closed. He was welcoming and conversational. His presenting concern was a desire to "go home" soon and he wished to have his wife to pick him up. He expressed that the staff have all been quite kind and attentive, however there is nothing like being in your own home. The patient named several family members in his network of support whom he has been in touch with during his stay. The patient said he'd love to have a shower and another shave; chaplain will follow up with nursing staff. 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 Patient expressed gratitude for the visit and welcomes future visits from spiritual care staff.

## 2018-10-02 NOTE — Progress Notes (Signed)
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 Lipitor [Atorvastatin] Hives and Itching  . Other   . Viagra  [Sildenafil Citrate]     vision loss    Patient Measurements: Height: 6\' 4"  (193 cm) Weight: (!) 343 lb 14.7 oz (156 kg) IBW/kg (Calculated) : 86.8  Vital Signs: Temp: 100.6 F (38.1 C) (06/15 0025) Temp Source: Oral (06/15 0025) BP: 146/82 (06/15 0025) Pulse Rate: 94 (06/15 0025)  Labs: Recent Labs    09/30/18 0025  09/30/18 0535 09/30/18 0939 10/01/18 0427 10/01/18 0745 10/01/18 1041 10/02/18 0506  HGB  --    < > 8.4*  --  8.5*  --   --  8.7*  HCT  --   --  25.7*  --  27.0*  --   --  27.1*  PLT  --   --  504*  --  499*  --   --  503*  APTT 75*  --   --   --  97* 86* 82* 103*  LABPROT 24.1*  --   --   --  27.5*  --   --  29.3*  INR 2.2*  --   --   --  2.6*  --   --  2.8*  CREATININE  --   --   --  8.21*  --  8.74*  --   --    < > = values in this interval not displayed.    Estimated Creatinine Clearance: 14.6 mL/min (A) (by C-G formula based on SCr of 8.74 mg/dL (H)).   Medical History: Past Medical History:  Diagnosis Date  . Allergy   . Anxiety   . Arthritis    knees  . Asthma, mild intermittent, well-controlled   . Chronic obstructive asthma ([place])   . COPD (chronic obstructive pulmonary disease) ([place])   . Depression with anxiety   . Diabetes ([place])   . GERD (gastroesophageal reflux disease)   . HBP (high blood pressure)   . High cholesterol   . Renal cyst, right   . Sleep apnea    CPAP  . Swelling     Medications:  Medications Prior to Admission  Medication Sig Dispense Refill Last Dose  . acetaminophen (TYLENOL) 500 MG tablet Take 1 tablet (500 mg total) by mouth every 6 (six) hours as needed. 90 tablet 0 09/12/2018 at 0800  . albuterol (PROVENTIL HFA;VENTOLIN HFA) 108 (90  Base) MCG/ACT inhaler INHALE 2 PUFFS INTO THE LUNGS 4 (FOUR) TIMES DAILY. 6.7 Inhaler 0 09/12/2018 at 0800  . aspirin 81 MG tablet Take 81 mg by mouth daily.   09/12/2018 at 0800  . atenolol (TENORMIN) 25 MG tablet Take 1 tablet (25 mg total) by mouth every evening. 90 tablet 1 09/11/2018 at 2000  . cephALEXin (KEFLEX) 500 MG capsule Take 500 mg by mouth 3 (three) times daily.   09/12/2018 at 0800  . citalopram (CELEXA) 20 MG tablet Take 1 tablet (20 mg total) by mouth daily. 90 tablet 1 09/12/2018 at 0800  . fluticasone furoate-vilanterol (BREO ELLIPTA) 100-25 MCG/INH AEPB Inhale 1 puff into the lungs daily. 180 each 1 09/12/2018 at 0800  . Multiple Vitamins-Minerals (MENS MULTIVITAMIN PLUS) TABS Take by mouth.   09/12/2018 at 0800  . Omega-3 Fatty Acids (FISH OIL) 1000 MG CAPS Take by mouth daily.   09/12/2018 at 0800  .  oxyCODONE-acetaminophen (PERCOCET/ROXICET) 5-325 MG tablet Take 1 tablet by mouth every 4 (four) hours.   09/12/2018 at 0800  . rosuvastatin (CRESTOR) 20 MG tablet TAKE 1 TABLET BY MOUTH EVERY DAY 90 tablet 1 09/12/2018 at 0800  . SENNA-PLUS 8.6-50 MG tablet Take 2 tablets by mouth every morning.   09/12/2018 at 0800  . sulfamethoxazole-trimethoprim (BACTRIM DS) 800-160 MG tablet Take 1 tablet by mouth 2 (two) times a day.   09/12/2018 at 0800  . XARELTO 10 MG TABS tablet Take 10 mg by mouth daily.    09/12/2018 at 0800  . cetirizine (ZYRTEC) 10 MG tablet Take 10 mg by mouth daily.   prn at prn  . diclofenac sodium (VOLTAREN) 1 % GEL APPLY 4 GRAMS TOPICALLY FOUR TIMES A DAY 100 g 2 prn at prn  . lidocaine (XYLOCAINE) 2 % solution Use as directed 15 mLs in the mouth or throat as needed for mouth pain. 100 mL 0 prn at prn  . triamcinolone cream (KENALOG) 0.1 % triamcinolone acetonide 0.1 % topical cream   prn at prn    Assessment: 58 yr male had B/l knee replacements Sep 01, 2018 was admitted with fever, AKI/ Platelet count has been declining since 6/5. He was started on heparin 6/3. 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 Will decrease Argatroban by 30% to 0.224 mcg/kg/min. Will check aPTT in 2hours. CBC with AM labs daily.    Stop date for argatroban 6/14 per cardiology recommendation.    WARFARIN: INR 2.8. Level is therapeutic but trending up.    Will order Warfarin 4mg  dose this evening x 1 dose  Recheck INR w/ am labs  Paulina Fusi, PharmD, BCPS 10/02/2018 5:51 AM

## 2018-10-02 NOTE — Progress Notes (Signed)
[facility] [place] NAME: Isaac Cross    MR#:  [id number]  DATE OF BIRTH:  10/17/1960  SUBJECTIVE:   The patient has no complaints except generalized weakness.  He has good urine output. REVIEW OF SYSTEMS:  Review of Systems  Constitutional: Negative for chills, fever and weight loss.  HENT: Negative for ear discharge, ear pain and nosebleeds.   Eyes: Negative for blurred vision, pain and discharge.  Respiratory: Negative for sputum production, shortness of breath, wheezing and stridor.   Cardiovascular: Negative for chest pain, palpitations, orthopnea and PND.  Gastrointestinal: Negative for abdominal pain, diarrhea, nausea and vomiting.  Genitourinary: Negative for frequency and urgency.  Musculoskeletal: Negative for back pain and joint pain.  Neurological: Positive for weakness. Negative for sensory change, speech change and focal weakness.  Psychiatric/Behavioral: Negative for depression and hallucinations. The patient is not nervous/anxious.    DRUG ALLERGIES:   Allergies  Allergen Reactions  . 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 Lipitor [Atorvastatin] Hives and Itching  . Other   . Viagra  [Sildenafil Citrate]     vision loss    VITALS:  Blood pressure (!) 144/92, pulse 93, temperature 100.2 F (37.9 C), temperature source Oral, resp. rate 18, height 6\' 4"  (1.93 m), weight (!) 156 kg, SpO2 95 %.  PHYSICAL EXAMINATION:  Physical Exam   GENERAL:  58 y.o.-year-old patient lying in the bed with no acute distress. Critically ill appearing EYES: Pupils equal, round, reactive to light and accommodation. No scleral icterus.  HEENT: Head atraumatic, normocephalic. Oropharynx and nasopharynx clear.  NECK:  Supple, no jugular venous distention. No thyroid enlargement, no tenderness.  LUNGS: Normal breath sounds bilaterally, no wheezing, rales,rhonchi or crepitation. No use of accessory muscles of  respiration.  Decreased bibasilar breath sounds CARDIOVASCULAR: S1, S2 normal. No murmurs, rubs, or gallops. left IJ dialysis catheter  ABDOMEN: Soft, nontender, nondistended. Bowel sounds present. No organomegaly or mass. Foley+ EXTREMITIES: Swelling of both legs, knees noted with recent surgery.  No  cyanosis, or clubbing.  Both hands are swollen as well. NEUROLOGIC: Cranial nerves II through XII are intact. Moving all extremities well PSYCHIATRIC: patient is alert and oriented x2 SKIN: No obvious rash, lesion, or ulcer.    LABORATORY PANEL:   CBC Recent Labs  Lab 10/02/18 0506  WBC 10.3  HGB 8.7*  HCT 27.1*  PLT 503*   ------------------------------------------------------------------------------------------------------------------  Chemistries  Recent Labs  Lab 09/29/18 1642  10/02/18 0506  NA  --    < > 137  K  --    < > 3.4*  CL  --    < > 99  CO2  --    < > 24  GLUCOSE  --    < > 106*  BUN  --    < > 36*  CREATININE  --    < > 8.63*  CALCIUM  --    < > 8.3*  MG  --    < > 1.9  AST 38  --   --   ALT 22  --   --   ALKPHOS 58  --   --   BILITOT 0.7  --   --    < > = values in this interval not displayed.   ------------------------------------------------------------------------------------------------------------------  Cardiac Enzymes No results for input(s): TROPONINI in the last 168 hours. ------------------------------------------------------------------------------------------------------------------  RADIOLOGY:  No results found.  EKG:  Orders placed or performed during the hospital encounter of 09/12/18  . EKG 12-Lead  . EKG 12-Lead  . EKG 12-Lead  . EKG 12-Lead  . EKG 12-Lead  . EKG 12-Lead    ASSESSMENT AND PLAN:    EdwardGradyis a58 y.o.malewith a known history of arthritis, COPD not on home oxygen, hypertension, sleep apnea, non-insulin-dependent diabetes mellitus presents to hospital secondary to dizziness, weakness and a fall   1.Sepsis -s/p Recent bilateral knee replacement surgeries as outpatient and now bilateral knee washouts done this adm. - fluid Cultures are negative so far. - Fungal cultures negative -Appreciate ID consult.   -Patient was on zosyn, doxycycline --now on ceftazidime and daptomycin (last dose 10/31/2018) -completed 7 days of anidulafungin    2. Acute non- Oliguric renal failure-likely ATN from sepsis and hypotension - appreciate nephrology input. -Hemodialysis started 09/22/2018 -Hold lisinopril and metformin and other nephrotoxins.    The patient has good urine output, creatinine is improving.  3. Acute encephalopathy-secondary to metabolic causes, sepsis and uremia. -No focal deficits Improved.  4. Left popliteal vein DVT - was on heparin drip--platelets dropping--IV argatroban--plt count normal--added po coumadin--needs 5 days of bridiging--pharmacy to dose. 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 Diabetes mellitus-2 Continue sliding scale, hold Lantus.  6. DVT prophylaxis- argatroban drip and coumadin  Anemia of chronic disease.  Stable.  Hypokalemia.  Potassium supplement. Hypomagnesemia.  Improved with IV magnesium.  I discussed with Dr. Juleen China. I discussed with his wife.  Generalized weakness.  PT evaluation suggest CIR. CODE STATUS: Full code  TOTAL TIME TAKING CARE OF THIS PATIENT: 26 minutes.   POSSIBLE D/C IN 3 DAYS, DEPENDING ON CLINICAL CONDITION.   Demetrios Loll M.D on 10/02/2018 at 12:23 PM  Between 7am to 6pm - Pager - [phone] After 6pm go to [email or web address] - password [email or web address] [facility]  Office  [phone]  CC: Primary care physician; Steele Sizer, MD

## 2018-10-02 NOTE — Progress Notes (Signed)
Inpatient Rehabilitation-Admissions Coordinator   [facility] was contacted by [facility] regarding possible CIR needs. AC continues to follow pt progress. Noted pt still being followed daily for HD needs. Pt will need to have either renal recovery or be declared ESRD with outpatient bed secured prior to possible acceptance to CIR. While pt continues with medical workup, AC will follow for tolerance with therapies. Noted pt frequently declines OOB activity. Will need to see an increase in participation and endurance if pt would like to be considered for CIR.   Will continue to follow.   Jhonnie Garner, OTR/L  Rehab Admissions Coordinator  [phone] 10/02/2018 3:56 PM

## 2018-10-02 NOTE — TOC Progression Note (Addendum)
Transition of Care [facility]) - Progression Note    Patient Details  Name: Isaac Cross MRN: [id number] Date of Birth: [birth year]  Transition of [facility]) CM/SW Contact  Delmus Warwick, Lenice Llamas Phone Number: [phone]  10/02/2018, 1:55 PM  Clinical Narrative:   Per [facility] liaison they can't accept patient on dapto unless he is able to get dapto at the outpatient dialysis clinic. Nephrology MD and the Infectious Disease MD are aware of above. Clinical Education officer, museum (CSW contacted patient's wife [name] and made her aware of above. CSW provided emotional support. Wife is now interested in [place]. RN case manager asked [facility] admissions coordinator at [facility] to review chart.    Expected Discharge Plan: [place] Barriers to Discharge: Waiting for outpatient dialysis, Other (comment)(acute renal currently)  Expected Discharge Plan and Services Expected Discharge Plan: [place] In-house Referral: Clinical Social Work Discharge Planning Services: CM Consult Post Acute Care Choice: [place] arrangements for the past 2 months: [place]: (Patient stated that he is open to home health. ) Date [place]: 09/18/18 Time [place]: [place] Representative spoke with at [place]: [name]- unsure of when patient will discharge.   Social Determinants of Health (SDOH) Interventions    Readmission Risk Interventions No flowsheet data found.

## 2018-10-02 NOTE — Progress Notes (Signed)
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 Lipitor [Atorvastatin] Hives and Itching  . Other   . Viagra  [Sildenafil Citrate]     vision loss    Patient Measurements: Height: 6\' 4"  (193 cm) Weight: (!) 343 lb 14.7 oz (156 kg) IBW/kg (Calculated) : 86.8  Vital Signs: Temp: 100.2 F (37.9 C) (06/15 0728) Temp Source: Oral (06/15 0728) BP: 144/92 (06/15 0728) Pulse Rate: 93 (06/15 0728)  Labs: Recent Labs    09/30/18 0025  09/30/18 0535 09/30/18 2947 10/01/18 0427 10/01/18 0745 10/01/18 1041 10/02/18 0506 10/02/18 0800  HGB  --    < > 8.4*  --  8.5*  --   --  8.7*  --   HCT  --   --  25.7*  --  27.0*  --   --  27.1*  --   PLT  --   --  504*  --  499*  --   --  503*  --   APTT 75*  --   --   --  97* 86* 82* 103* 85*  LABPROT 24.1*  --   --   --  27.5*  --   --  29.3*  --   INR 2.2*  --   --   --  2.6*  --   --  2.8*  --   CREATININE  --   --   --  8.21*  --  8.74*  --  8.63*  --   CKTOTAL  --   --   --   --   --   --   --  114  --    < > = values in this interval not displayed.    Estimated Creatinine Clearance: 14.7 mL/min (A) (by C-G formula based on SCr of 8.63 mg/dL (H)).   Medical History: Past Medical History:  Diagnosis Date  . Allergy   . Anxiety   . Arthritis    knees  . Asthma, mild intermittent, well-controlled   . Chronic obstructive asthma ([place])   . COPD (chronic obstructive pulmonary disease) ([place])   . Depression with anxiety   . Diabetes ([place])   . GERD (gastroesophageal reflux disease)   . HBP (high blood pressure)   . High cholesterol   . Renal cyst, right   . Sleep apnea    CPAP  . Swelling     Medications:  Medications Prior to Admission  Medication Sig Dispense Refill Last Dose  . acetaminophen (TYLENOL) 500 MG tablet Take 1 tablet (500 mg total) by mouth every 6  (six) hours as needed. 90 tablet 0 09/12/2018 at 0800  . albuterol (PROVENTIL HFA;VENTOLIN HFA) 108 (90 Base) MCG/ACT inhaler INHALE 2 PUFFS INTO THE LUNGS 4 (FOUR) TIMES DAILY. 6.7 Inhaler 0 09/12/2018 at 0800  . aspirin 81 MG tablet Take 81 mg by mouth daily.   09/12/2018 at 0800  . atenolol (TENORMIN) 25 MG tablet Take 1 tablet (25 mg total) by mouth every evening. 90 tablet 1 09/11/2018 at 2000  . cephALEXin (KEFLEX) 500 MG capsule Take 500 mg by mouth 3 (three) times daily.   09/12/2018 at 0800  . citalopram (CELEXA) 20 MG tablet Take 1 tablet (20 mg total) by mouth daily. 90 tablet 1 09/12/2018 at 0800  . fluticasone furoate-vilanterol (BREO ELLIPTA) 100-25 MCG/INH AEPB Inhale 1 puff  into the lungs daily. 180 each 1 09/12/2018 at 0800  . Multiple Vitamins-Minerals (MENS MULTIVITAMIN PLUS) TABS Take by mouth.   09/12/2018 at 0800  . Omega-3 Fatty Acids (FISH OIL) 1000 MG CAPS Take by mouth daily.   09/12/2018 at 0800  . oxyCODONE-acetaminophen (PERCOCET/ROXICET) 5-325 MG tablet Take 1 tablet by mouth every 4 (four) hours.   09/12/2018 at 0800  . rosuvastatin (CRESTOR) 20 MG tablet TAKE 1 TABLET BY MOUTH EVERY DAY 90 tablet 1 09/12/2018 at 0800  . SENNA-PLUS 8.6-50 MG tablet Take 2 tablets by mouth every morning.   09/12/2018 at 0800  . sulfamethoxazole-trimethoprim (BACTRIM DS) 800-160 MG tablet Take 1 tablet by mouth 2 (two) times a day.   09/12/2018 at 0800  . XARELTO 10 MG TABS tablet Take 10 mg by mouth daily.    09/12/2018 at 0800  . cetirizine (ZYRTEC) 10 MG tablet Take 10 mg by mouth daily.   prn at prn  . diclofenac sodium (VOLTAREN) 1 % GEL APPLY 4 GRAMS TOPICALLY FOUR TIMES A DAY 100 g 2 prn at prn  . lidocaine (XYLOCAINE) 2 % solution Use as directed 15 mLs in the mouth or throat as needed for mouth pain. 100 mL 0 prn at prn  . triamcinolone cream (KENALOG) 0.1 % triamcinolone acetonide 0.1 % topical cream   prn at prn    Assessment: 58 yr male had B/l knee replacements Sep 01, 2018 was  admitted with fever, AKI/ Platelet count has been declining since 6/5. He was started on heparin 6/3. Child Pugh score estimated to be 5-6  Baseline aPTT: 35s 06/6   @1525 :aPTT 91s  plt 102 06/6   @1823 :aPTT 64s       06/6   @2013 :aPTT 63s   06/07 @0330  aPTT 60s  plt   99 06/08 @0540  aPTT 59s  plt 150 06/09 @0326  aPTT 55s  plt 227   INR 2.0  Warfarin 5mg  06/10 @0512  aPTT 71s  plt 274   INR 2.2  Warfarin 5mg  06/11 @ 0253 aPTT 88s plt 373   INR 2.6 warfarin 5mg  06/11 @ 0817 aPTT 80s 6/12 @ 0504 INR: 1.6 APTT 49  06/12 @ 1900 : aPTT 60 6/13 @ 0025 aPTT: 75, INR: 2.2  6/14 @ 0427 aPTT 97, INR 2.6 6/14 @ 0745 aPTT 86  6/14 @ 1041 aPTT 82 6/15 @ 0506 aPTT 103, INR 2.8 6/15 @ 0800 aPTT 85  HIT lab resulted 6/8 1740 - Abnormal 1.854, ordered SRA  Goal of Therapy:  aPTT 50-90 seconds Monitor platelets by anticoagulation protocol: Yes INR: 2-3 (however - may elevate higher than this while on Warfarin and Argatroban - it will come back down when argatroban stopped)  Plan:  ARGATROBAN:  aPTT level is therapeutic after previous dose decrease by 30% to 0.224 mcg/kg/min. Will check aPTT in 2hours. CBC with AM labs daily.    Stop date for argatroban 6/14 per cardiology recommendation.    WARFARIN: INR 2.8. Level is therapeutic but trending up.    Will order Warfarin 4mg  dose this evening x 1 dose  Recheck INR w/ am labs  Paulina Fusi, PharmD, BCPS 10/02/2018 8:32 AM

## 2018-10-02 NOTE — Progress Notes (Signed)
Patient accepted at [facility] TTS 11:40, Can start tomorrow 6/16 to arrive 11:00. Patient wife is aware.

## 2018-10-02 NOTE — Progress Notes (Signed)
[facility]  ROUNDING NOTE   Subjective:   Patient states he has no appetite  UOP 4500  Objective:  Vital signs in last 24 hours:  Temp:  [98.8 F (37.1 C)-100.6 F (38.1 C)] 100.2 F (37.9 C) (06/15 0728) Pulse Rate:  [89-96] 93 (06/15 0728) Resp:  [18-19] 18 (06/15 0728) BP: (133-146)/(82-109) 144/92 (06/15 0728) SpO2:  [91 %-98 %] 95 % (06/15 0728)  Weight change:  Filed Weights   09/29/18 0630 09/29/18 1445 09/29/18 1823  Weight: (!) 157.1 kg (!) 157 kg (!) 156 kg    Intake/Output: I/O last 3 completed shifts: In: 3081.7 [P.O.:240; I.V.:79.7; NG/GT:2607; IV Piggyback:155] Out: 7741 [Urine:7650]   Intake/Output this shift:  No intake/output data recorded.  Physical Exam: General: NAD  Head: Moist oral mucosal membranes  Eyes: Anicteric   Lungs:  clear  Heart: regular  Abdomen:  Soft, nontender, obese   Extremities: + peripheral edema  Neurologic: Awake, alert, conversant  Skin: warm        Basic Metabolic Panel: Recent Labs  Lab 09/26/18 1027 09/27/18 0512 09/28/18 0817 09/29/18 0504 09/29/18 1657 09/30/18 0025 09/30/18 0939 10/01/18 0745 10/02/18 0506  NA 140 138  --  138  --   --  135 134* 137  K 2.9* 3.4*  --  3.2*  --  3.5 3.3* 3.4* 3.4*  CL 102 101  --  101  --   --  98 98 99  CO2 25 25  --  22  --   --  26 25 24   GLUCOSE 100* 121*  --  94  --   --  100* 94 106*  BUN 66* 52*  --  61*  --   --  36* 37* 36*  CREATININE 9.23* 8.38* 10.19* 10.82*  --   --  8.21* 8.74* 8.63*  CALCIUM 7.7* 7.6*  --  8.1*  --   --  7.7* 7.9* 8.3*  MG  --   --   --   --   --   --   --  1.7 1.9  PHOS 4.4  --   --   --  5.3*  --   --   --   --     Liver Function Tests: Recent Labs  Lab 09/26/18 1027 09/29/18 1642  AST  --  38  ALT  --  22  ALKPHOS  --  58  BILITOT  --  0.7  PROT  --  5.6*  ALBUMIN 1.8* 1.8*   No results for input(s): LIPASE, AMYLASE in the last 168 hours. No results for input(s): AMMONIA in the last 168  hours.  CBC: Recent Labs  Lab 09/28/18 0253 09/29/18 0504 09/30/18 0535 10/01/18 0427 10/02/18 0506  WBC 10.4 11.2* 10.2 10.9* 10.3  HGB 8.9* 8.6* 8.4* 8.5* 8.7*  HCT 27.3* 26.8* 25.7* 27.0* 27.1*  MCV 82.0 81.5 81.6 84.1 81.6  PLT 373 499* 504* 499* 503*    Cardiac Enzymes: Recent Labs  Lab 09/26/18 0326 09/29/18 0504 10/02/18 0506  CKTOTAL 90 89 114    BNP: Invalid input(s): POCBNP  CBG: Recent Labs  Lab 10/01/18 1142 10/01/18 1626 10/01/18 2132 10/02/18 0730 10/02/18 1137  GLUCAP 105* 112* 133* 105 82    Microbiology: Results for orders placed or performed during the hospital encounter of 09/12/18  Blood Culture (routine x 2)     Status: None   Collection Time: 09/12/18  9:56 AM   Specimen: BLOOD LEFT HAND  Result Value  Ref Range Status   Specimen Description BLOOD LEFT HAND  Final   Special Requests   Final    BOTTLES DRAWN AEROBIC AND ANAEROBIC Blood Culture adequate volume   Culture   Final    NO GROWTH 5 DAYS Performed at [facility], [address], [place], [place] [address]    Report Status 09/17/2018 FINAL  Final  Urine culture     Status: None   Collection Time: 09/12/18 10:01 AM   Specimen: Urine, Random  Result Value Ref Range Status   Specimen Description   Final    URINE, RANDOM Performed at [facility], [address], [place], [place] [address]    Special Requests   Final    NONE Performed at [facility], [address], [place], [place] [address]    Culture   Final    NO GROWTH Performed at [facility], [place] [address] [place], [place] [address]    Report Status 09/13/2018 FINAL  Final  SARS Coronavirus 2 (CEPHEID- Performed in [place] hospital lab), Hosp Order     Status: None   Collection Time: 09/12/18 10:02 AM   Specimen: Nasopharyngeal Swab  Result Value Ref Range Status   SARS Coronavirus 2 NEGATIVE NEGATIVE Final    Comment: (NOTE) If result is NEGATIVE SARS-CoV-2 target  nucleic acids are NOT DETECTED. The SARS-CoV-2 RNA is generally detectable in upper and lower  respiratory specimens during the acute phase of infection. The lowest  concentration of SARS-CoV-2 viral copies this assay can detect is 250  copies / mL. A negative result does not preclude SARS-CoV-2 infection  and should not be used as the sole basis for treatment or other  patient management decisions.  A negative result may occur with  improper specimen collection / handling, submission of specimen other  than nasopharyngeal swab, presence of viral mutation(s) within the  areas targeted by this assay, and inadequate number of viral copies  (<250 copies / mL). A negative result must be combined with clinical  observations, patient history, and epidemiological information. If result is POSITIVE SARS-CoV-2 target nucleic acids are DETECTED. The SARS-CoV-2 RNA is generally detectable in upper and lower  respiratory specimens dur ing the acute phase of infection.  Positive  results are indicative of active infection with SARS-CoV-2.  Clinical  correlation with patient history and other diagnostic information is  necessary to determine patient infection status.  Positive results do  not rule out bacterial infection or co-infection with other viruses. If result is PRESUMPTIVE POSTIVE SARS-CoV-2 nucleic acids MAY BE PRESENT.   A presumptive positive result was obtained on the submitted specimen  and confirmed on repeat testing.  While 2019 novel coronavirus  (SARS-CoV-2) nucleic acids may be present in the submitted sample  additional confirmatory testing may be necessary for epidemiological  and / or clinical management purposes  to differentiate between  SARS-CoV-2 and other Sarbecovirus currently known to infect humans.  If clinically indicated additional testing with an alternate test  methodology [phone]) is advised. The SARS-CoV-2 RNA is generally  detectable in upper and lower  respiratory sp ecimens during the acute  phase of infection. The expected result is Negative. Fact Sheet for Patients:  StrictlyIdeas.no Fact Sheet for Healthcare Providers: BankingDealers.co.za This test is not yet approved or cleared by the [place] FDA and has been authorized for detection and/or diagnosis of SARS-CoV-2 by FDA under an Emergency Use Authorization (EUA).  This EUA will remain in effect (  meaning this test can be used) for the duration of the COVID-19 declaration under Section 564(b)(1) of the Act, 21 U.S.C. section 360bbb-3(b)(1), unless the authorization is terminated or revoked sooner. Performed at [facility], [address] [place], [place] [address]   Blood Culture (routine x 2)     Status: None   Collection Time: 09/12/18 10:06 AM   Specimen: BLOOD  Result Value Ref Range Status   Specimen Description BLOOD LEFT ANTECUBITAL  Final   Special Requests   Final    BOTTLES DRAWN AEROBIC AND ANAEROBIC Blood Culture adequate volume   Culture   Final    NO GROWTH 5 DAYS Performed at [facility], [address] [place], [place] [address]    Report Status 09/17/2018 FINAL  Final  MRSA PCR Screening     Status: None   Collection Time: 09/12/18  7:04 PM   Specimen: Nasal Mucosa; Nasopharyngeal  Result Value Ref Range Status   MRSA by PCR NEGATIVE NEGATIVE Final    Comment:        The GeneXpert MRSA Assay (FDA approved for NASAL specimens only), is one component of a comprehensive MRSA colonization surveillance program. It is not intended to diagnose MRSA infection nor to guide or monitor treatment for MRSA infections. Performed at [facility], [address] [place], [place] [address]   C difficile quick scan w PCR reflex     Status: None   Collection Time: 09/14/18  2:21 PM   Specimen: STOOL  Result Value Ref Range Status   C Diff antigen NEGATIVE NEGATIVE Final   C  Diff toxin NEGATIVE NEGATIVE Final   C Diff interpretation No C. difficile detected.  Final    Comment: Performed at [facility], [address] [place], [place] [address]  Group A Strep by PCR     Status: None   Collection Time: 09/15/18  1:53 PM  Result Value Ref Range Status   Group A Strep by PCR NOT DETECTED NOT DETECTED Final    Comment: Performed at [facility], [address], [place], [place] [address]  Body fluid culture     Status: None   Collection Time: 09/15/18  5:13 PM   Specimen: KNEE  Result Value Ref Range Status   Specimen Description   Final    KNEE Performed at [facility], [address], [place], [place] [address]    Special Requests   Final    NONE Performed at [facility], [address], [place], [place] [address]    Gram Stain   Final    RARE RBCS FEW WBC SEEN NO ORGANISMS SEEN Performed at [facility], [address] [place], [place] [address]    Culture   Final    NO GROWTH 3 DAYS Performed at [facility], [place] [address] [place], [place] [address]    Report Status 09/19/2018 FINAL  Final  Body fluid culture     Status: None   Collection Time: 09/16/18  8:55 AM   Specimen: KNEE  Result Value Ref Range Status   Specimen Description KNEE RIGHT  Final   Special Requests NONE  Final   Gram Stain   Final    RARE WBC PRESENT, PREDOMINANTLY PMN NO ORGANISMS SEEN    Culture   Final    NO GROWTH 3 DAYS Performed at [facility], [place] [address], [place], [place] [address]    Report Status 09/20/2018  FINAL  Final  Aerobic/Anaerobic Culture (surgical/deep wound)     Status: None   Collection Time: 09/17/18 11:32 AM   Specimen: Wound  Result Value Ref Range Status   Specimen Description   Final    TISSUE LEFT KNEE 1 Performed at [facility], [address] [place], [place] [address]    Special Requests PATIENT ON FOLLOWING MAXIPIME VANCOMYCIN  Final   Gram Stain    Final    FEW WBC PRESENT,BOTH PMN AND MONONUCLEAR NO ORGANISMS SEEN    Culture   Final    No growth aerobically or anaerobically. Performed at [facility], [place] [address], [place], [place] [address]    Report Status 09/22/2018 FINAL  Final  Acid Fast Smear (AFB)     Status: None   Collection Time: 09/17/18 11:32 AM   Specimen: Synovium  Result Value Ref Range Status   AFB Specimen Processing Comment  Final    Comment: Tissue Grinding and Digestion/Decontamination   Acid Fast Smear Negative  Final    Comment: (NOTE) Performed At: [facility] [place], [place] [id number] Rush Farmer MD [id number]    Source (AFB) TISSUE  Final    Comment: LEFT KNEE Performed at [facility], [place] [address], [place], [place] [address]   Culture, fungus without smear     Status: None (Preliminary result)   Collection Time: 09/17/18 11:34 AM   Specimen: Synovium; Other  Result Value Ref Range Status   Specimen Description   Final    SYNOVIAL Performed at [facility], [address], [place], [place] [address]    Special Requests   Final    NONE Performed at [facility], [address] [place], [place] [address]    Culture   Final    NO FUNGUS ISOLATED AFER 12 DAYS Performed at [facility], [place] [address] [place], [place] [address]    Report Status PENDING  Incomplete  Acid Fast Culture with reflexed sensitivities     Status: None   Collection Time: 09/17/18 11:36 AM   Specimen: Synovium  Result Value Ref Range Status   Acid Fast Culture CANC  Final    Comment: (NOTE) Test cancelled at client's request.      Cancelled per [place] Erp 09/21/2018 Performed At: [facility] [place], [place] [id number] Rush Farmer MD [id number]    Source of Sample TISSUE  Final    Comment: RIGHT KNEE Performed at [facility], [place] [address], [place], [place] [address]   Aerobic/Anaerobic  Culture (surgical/deep wound)     Status: None   Collection Time: 09/17/18 11:43 AM   Specimen: Wound  Result Value Ref Range Status   Specimen Description   Final    TISSUE LEFT KNEE 2 Performed at [facility], [address] [place], [place] [address]    Special Requests PATIENT ON FOLLOWING MAXIPIME VANCOMYCIN  Final   Gram Stain   Final    FEW WBC PRESENT, PREDOMINANTLY PMN NO ORGANISMS SEEN    Culture   Final    No growth aerobically or anaerobically. Performed at [facility], [place] [address], [place], [place] [address]    Report Status 09/22/2018 FINAL  Final  Aerobic/Anaerobic Culture (surgical/deep wound)     Status: None   Collection Time: 09/17/18 11:44 AM   Specimen: Wound  Result Value Ref Range Status   Specimen Description   Final  TISSUE LEFT KNEE 3 Performed at [facility], [address] [place], [place] [address]    Special Requests PATIENT ON FOLLOWING MAXIPIME VANCOMYCIN  Final   Gram Stain   Final    MODERATE WBC PRESENT, PREDOMINANTLY PMN NO ORGANISMS SEEN    Culture   Final    No growth aerobically or anaerobically. Performed at [facility], [place] [address] [place], [place] [address]    Report Status 09/22/2018 FINAL  Final  Aerobic/Anaerobic Culture (surgical/deep wound)     Status: None   Collection Time: 09/18/18  9:43 PM   Specimen: [facility] Other; Tissue  Result Value Ref Range Status   Specimen Description   Final    SYNOVIAL Performed at [facility], [address] [place], [place] [address]    Special Requests   Final    NONE Performed at [facility], [address] [place], [place] [address]    Gram Stain   Final    RARE WBC PRESENT, PREDOMINANTLY MONONUCLEAR NO ORGANISMS SEEN    Culture   Final    No growth aerobically or anaerobically. Performed at [facility], [place] [address], [place], [place] [address]    Report Status 09/24/2018 FINAL  Final  Aerobic/Anaerobic  Culture (surgical/deep wound)     Status: None   Collection Time: 09/18/18  9:44 PM   Specimen: [facility] Other; Tissue  Result Value Ref Range Status   Specimen Description   Final    SYNOVIAL Performed at [facility], [address], [place], [place] [address]    Special Requests   Final    RIGHT KNEE Performed at [facility], [place], [place] [address]    Gram Stain   Final    FEW WBC PRESENT,BOTH PMN AND MONONUCLEAR NO ORGANISMS SEEN    Culture   Final    No growth aerobically or anaerobically. Performed at [facility], [place] [address], [place], [place] [address]    Report Status 09/24/2018 FINAL  Final  Aerobic/Anaerobic Culture (surgical/deep wound)     Status: None   Collection Time: 09/18/18  9:45 PM   Specimen: [facility] Other; Tissue  Result Value Ref Range Status   Specimen Description   Final    SYNOVIAL Performed at [facility], [address], [place], [place] [address]    Special Requests   Final    RIGHT KNEE Performed at [facility], [address] [place], [place] [address]    Gram Stain   Final    FEW WBC PRESENT, PREDOMINANTLY MONONUCLEAR NO ORGANISMS SEEN    Culture   Final    No growth aerobically or anaerobically. Performed at [facility], [place] [address], [place], [place] [address]    Report Status 09/24/2018 FINAL  Final  Culture, blood (Routine X 2) w Reflex to ID Panel     Status: None   Collection Time: 09/19/18  1:11 PM   Specimen: BLOOD  Result Value Ref Range Status   Specimen Description BLOOD LEFT ANTECUBITAL  Final   Special Requests   Final    BOTTLES DRAWN AEROBIC AND ANAEROBIC Blood Culture results may not be optimal due to an excessive volume of blood received in culture bottles   Culture   Final    NO GROWTH 5 DAYS Performed at [facility], [address], [place],  [address]    Report Status 09/24/2018 FINAL  Final  Culture, blood (Routine X 2)  w Reflex to ID Panel     Status: None   Collection Time: 09/19/18  1:20 PM   Specimen: BLOOD  Result Value Ref Range Status   Specimen Description BLOOD BLOOD RIGHT HAND  Final   Special Requests   Final    BOTTLES DRAWN AEROBIC AND ANAEROBIC Blood Culture results may not be optimal due to an excessive volume of blood received in culture bottles   Culture   Final    NO GROWTH 5 DAYS Performed at [facility], [address], [place], [place] [address]    Report Status 09/24/2018 FINAL  Final  Urine Culture     Status: None   Collection Time: 09/20/18  3:59 PM   Specimen: Urine, Random  Result Value Ref Range Status   Specimen Description   Final    URINE, RANDOM Performed at [facility], [address], [place], [place] [address]    Special Requests   Final    NONE Performed at [facility], [address] [place], [place] [address]    Culture   Final    NO GROWTH Performed at [facility], [place] [address], [place], [place] [address]    Report Status 09/21/2018 FINAL  Final  C difficile quick scan w PCR reflex     Status: None   Collection Time: 09/21/18  3:27 PM   Specimen: STOOL  Result Value Ref Range Status   C Diff antigen NEGATIVE NEGATIVE Final   C Diff toxin NEGATIVE NEGATIVE Final   C Diff interpretation No C. difficile detected.  Final    Comment: Performed at [facility], [address] [place], [place] [address]  Gastrointestinal Panel by PCR , Stool     Status: None   Collection Time: 09/21/18  3:27 PM   Specimen: Stool  Result Value Ref Range Status   Campylobacter species NOT DETECTED NOT DETECTED Final   Plesimonas shigelloides NOT DETECTED NOT DETECTED Final   Salmonella species NOT DETECTED NOT DETECTED Final   Yersinia enterocolitica NOT DETECTED NOT DETECTED Final   Vibrio species NOT DETECTED NOT DETECTED Final   Vibrio cholerae NOT DETECTED NOT DETECTED Final   Enteroaggregative E coli (EAEC) NOT  DETECTED NOT DETECTED Final   Enteropathogenic E coli (EPEC) NOT DETECTED NOT DETECTED Final   Enterotoxigenic E coli (ETEC) NOT DETECTED NOT DETECTED Final   Shiga like toxin producing E coli (STEC) NOT DETECTED NOT DETECTED Final   Shigella/Enteroinvasive E coli (EIEC) NOT DETECTED NOT DETECTED Final   Cryptosporidium NOT DETECTED NOT DETECTED Final   Cyclospora cayetanensis NOT DETECTED NOT DETECTED Final   Entamoeba histolytica NOT DETECTED NOT DETECTED Final   Giardia lamblia NOT DETECTED NOT DETECTED Final   Adenovirus F40/41 NOT DETECTED NOT DETECTED Final   Astrovirus NOT DETECTED NOT DETECTED Final   Norovirus GI/GII NOT DETECTED NOT DETECTED Final   Rotavirus A NOT DETECTED NOT DETECTED Final   Sapovirus (I, II, IV, and V) NOT DETECTED NOT DETECTED Final    Comment: Performed at [facility], [address] [place], [place] [address]  Novel Coronavirus, NAA (hospital order; send-out to ref lab)     Status: None   Collection Time: 09/29/18  4:36 PM   Specimen: Nasopharyngeal Swab; Respiratory  Result Value Ref Range Status   SARS-CoV-2, NAA NOT DETECTED NOT DETECTED Final    Comment: (NOTE) This test was developed and its performance characteristics determined by  [employer]. This test has not been FDA cleared or approved. This test has been authorized by FDA under an Emergency Use Authorization (EUA). This test is only authorized for the duration of time the declaration that circumstances exist justifying the authorization of the emergency use of in vitro diagnostic tests for detection of SARS-CoV-2 virus and/or diagnosis of COVID-19 infection under section 564(b)(1) of the Act, 21 U.S.C. 003KJZ-7(H)(1), unless the authorization is terminated or revoked sooner. When diagnostic testing is negative, the possibility of a false negative result should be considered in the context of a patient's recent exposures and the presence of clinical signs and symptoms  consistent with COVID-19. An individual without symptoms of COVID-19 and who is not shedding SARS-CoV-2 virus would expect to have a negative (not detected) result in this assay. Performed  At: [facility] [place], [place] [id number] Rush Farmer MD [id number]    [place]  Final    Comment: Performed at [facility], [address] [place], [place] [address]    Coagulation Studies: Recent Labs    09/30/18 0025 10/01/18 0427 10/02/18 0506  LABPROT 24.1* 27.5* 29.3*  INR 2.2* 2.6* 2.8*    Urinalysis: No results for input(s): COLORURINE, LABSPEC, PHURINE, GLUCOSEU, HGBUR, BILIRUBINUR, KETONESUR, PROTEINUR, UROBILINOGEN, NITRITE, LEUKOCYTESUR in the last 72 hours.  Invalid input(s): APPERANCEUR    Imaging: No results found.   Medications:   . sodium chloride 5 mL/hr at 09/29/18 0242  . sodium chloride 0 mL/hr at 09/30/18 1534  . anticoagulant sodium citrate    . argatroban 0.224 mcg/kg/min (10/02/18 4492)  . cefTAZidime (FORTAZ)  IV 1 g (10/01/18 1647)  . DAPTOmycin (CUBICIN)  IV 700 mg (10/01/18 1958)   . aspirin EC  81 mg Oral Daily  . atenolol  25 mg Oral Daily  . Chlorhexidine Gluconate Cloth  6 each Topical Q0600  . citalopram  20 mg Oral Daily  . feeding supplement (NEPRO CARB STEADY)  237 mL Oral BID BM  . fluticasone furoate-vilanterol  1 puff Inhalation Daily  . folic acid  1 mg Oral Daily  . insulin aspart  0-5 Units Subcutaneous QHS  . insulin aspart  0-9 Units Subcutaneous TID WC  . mouth rinse  15 mL Mouth Rinse BID  . multivitamin  1 tablet Oral Q1200  . multivitamin with minerals  1 tablet Oral Daily  . omega-3 acid ethyl esters  1 g Oral Daily  . rosuvastatin  20 mg Oral Daily  . sodium chloride flush  10-40 mL Intracatheter Q12H  . sodium chloride flush  3 mL Intravenous Q12H  . warfarin  4 mg Oral ONCE-1800  . Warfarin - Pharmacist Dosing Inpatient   Does not apply q1800   sodium  chloride, sodium chloride, acetaminophen **OR** acetaminophen, albuterol, alum & mag hydroxide-simeth, anticoagulant sodium citrate, fentaNYL (SUBLIMAZE) injection, HYDROcodone-acetaminophen, iohexol, ipratropium-albuterol, menthol-cetylpyridinium **OR** phenol, menthol-cetylpyridinium **OR** phenol, metoCLOPramide **OR** metoCLOPramide (REGLAN) injection, ondansetron **OR** ondansetron (ZOFRAN) IV, sodium chloride flush, sodium chloride flush  Assessment/ Plan:  Mr. SEHAJ MCENROE is a 58 y.o. black male with hyeprtension, COPD, diabetes mellitus type II who underwent bilateral knee replacement surgeries last week. who was admitted to [facility] on 09/12/2018 for sepsis  1. Acute renal failure with metabolic acidosis  baseline creatinine of 1.07 with normal GFR in 06/12/18.  Likely severe ATN from hypotension and sepsis No indication for dialysis today. Polyuria could be post ATN diuresis - Monitor volume status - Monitor daily for dialysis need  2. Septic shock/hypotension: bilateral knee replacement on Sep 01, 2018 at [facility]  Knee septic arthritis, patient underwent I&D and polyethylene exchange on 5/31 and Rt knee on 6/1 - ID recommends 4 weeks of ceftazidime and daptomycin (6mg /kg) until 10/31/2018  3. Hypokalemia -Replace potassium    LOS: 20 Zamar Odwyer 6/15/20201:25 PM

## 2018-10-02 NOTE — TOC Progression Note (Signed)
Transition of Care [facility]) - Progression Note    Patient Details  Name: Isaac Cross MRN: [id number] Date of Birth: 1960-10-12  Transition of Care [facility]) CM/SW [place], RN Phone Number: 10/02/2018, 4:03 PM  Clinical Narrative:    Damaris Schooner with Claiborne Billings from [facility], she has reviewed the patient and pout in a note, she will continue to follow the patient but would like to see more participation with PT from the patient and him getting out of bed for at least an hour at a time.    Expected Discharge Plan: [place] Barriers to Discharge: Waiting for outpatient dialysis, Other (comment)(acute renal currently)  Expected Discharge Plan and Services Expected Discharge Plan: [place] In-house Referral: Clinical Social Work Discharge Planning Services: CM Consult Post Acute Care Choice: [place] arrangements for the past 2 months: [place]: (Patient stated that he is open to home health. ) Date [place]: 09/18/18 Time [place]: [place] Representative spoke with at [place]: [name]- unsure of when patient will discharge.   Social Determinants of Health (SDOH) Interventions    Readmission Risk Interventions No flowsheet data found.

## 2018-10-02 NOTE — Progress Notes (Signed)
Date of Admission:  09/12/2018   T Low grade fever over the weekend  Says he is feleing okay Denies any cough, chest pain or sob   Admitted with fever and swelling of legs /knee on 09/12/18 with AKI following b/l TKA on 09/01/18 hospital course complicated by hypotension, septic shock, worsening AKI/ Alcohol withdrawal with delirium needing sedation. 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 aspirin EC  81 mg Oral Daily  . atenolol  25 mg Oral Daily  . Chlorhexidine Gluconate Cloth  6 each Topical Q0600  . citalopram  20 mg Oral Daily  . feeding supplement (NEPRO CARB STEADY)  237 mL Oral BID BM  . fluticasone furoate-vilanterol  1 puff Inhalation Daily  . folic acid  1 mg Oral Daily  . insulin aspart  0-5 Units Subcutaneous QHS  . insulin aspart  0-9 Units Subcutaneous TID WC  . mouth rinse  15 mL Mouth Rinse BID  . multivitamin  1 tablet Oral Q1200  . multivitamin with minerals  1 tablet Oral Daily  . omega-3 acid ethyl esters  1 g Oral Daily  . rosuvastatin  20 mg Oral Daily  . sodium chloride flush  10-40 mL Intracatheter Q12H  . sodium chloride flush  3 mL Intravenous Q12H  . warfarin  4 mg Oral ONCE-1800  . Warfarin - Pharmacist Dosing Inpatient   Does not apply q1800    Objective: Vital signs in last 24 hours: Temp:  [98.8 F (37.1 C)-100.6 F (38.1 C)] 100.2 F (37.9 C) (06/15 0728) Pulse Rate:  [89-96] 93 (06/15 0728) Resp:  [18-19] 18 (06/15 0728) BP: (133-146)/(82-109) 144/92 (06/15 0728) SpO2:  [91 %-98 %] 95 % (06/15 0728)  PHYSICAL EXAM:  General: Alert, cooperative, no distress, appears stated age. Oriented in place, person, year, month Head: Normocephalic, without obvious abnormality, atraumatic. Eyes: Conjunctivae clear, anicteric sclerae. Pupils are equal ENT Nares normal. No drainage or sinus tenderness.  Lips, mucosa, and tongue normal. No Thrush Neck: Supple, symmetrical, no adenopathy, thyroid: non tender no carotid bruit and no JVD. Back: No CVA tenderness. Lungs: b/l air entry Heart: s1s2Abdomen: Soft, non-tender,not distended. Bowel sounds normal. No masses Extremities:edema legs Skin: No rashes or lesions. Or bruising Lymph: Cervical, supraclavicular normal. Neurologic: Grossly non-focal  Lab Results Recent Labs    10/01/18 0427 10/01/18 0745 10/02/18 0506  WBC 10.9*  --  10.3  HGB 8.5*  --  8.7*  HCT 27.0*  --  27.1*  NA  --  134* 137  K  --  3.4* 3.4*  CL  --  98 99  CO2  --  25 24  BUN  --  37* 36*  CREATININE  --  8.74* 8.63*   Liver Panel Recent Labs    09/29/18 1642  PROT 5.6*  ALBUMIN 1.8*  AST 38  ALT 22  ALKPHOS 58  BILITOT 0.7  BILIDIR 0.2  IBILI 0.5   6/3 ESR 106 6/11 ESR 69  Immunology and other work up for fever ANA 09/12/18-& 09/19/18 =Neg ACE level 09/19/18 NEg Complement c4/C3 Neg 09/28/18 ANCA neg 09/28/18 Beta D glucan 09/20/18-Neg CK 09/13/18 was 4516 CK 10/02/18 is 114 TSH 5.055 on 09/15/18 Cortisol on 09/20/18 was 18.4 ( 4 pm) Quantiferon Gold on 09/21/18 indeterminate Hepatitis C from 09/21/18 NR Hepatitis A and B neg  HIV NR 09/20/18  Microbiology:  BC 09/12/18-NG 09/19/18 BC NG 6/3 fungal culture blood  neg  Synovial fluid culture left 09/15/18 Neg Synovial fluid rt knee NG from 09/16/18 Synovial tissue culture left knee 5/31  NG NGS sent to [place] ( 16s RNA) neg AFB/FUNGAL from left synovial tissue negative from 09/17/18 so far    Assessment/Plan: 58 yr male had B/l knee replacements Sep 01, 2018 was admitted with fever, AKI . He was taking bactrim and keflex when he came to the Ed on 09/12/18 with fever  Initially feverswere persisting inspite of IV vanco and cefepime and  there was no other source (Blood culture neg, urine culture neg, no pneumonia, ) the left knee was aspirated and it had 8000 wbc and he was  taken for a wash out on 09/17/18. Rt knee was washed out on 09/18/18 All cultures neg Pts fever was down for 36-48 hrs and started up again on 09/19/18 at 103. Central line, femoral line were removed Cefepime was changed to zosyn on 6/2 Vanco was discontinued on 09/19/18 -added anidulafungin6/3and doxy 6/3- - he also was started on stress dose steroids on the same day and was also diagnosed with left popliteal DVT on 09/20/18 and started on heparin-  Asfungal blood culture negDC anidulafungin 0n 09/26/18 Doxy Dc as well. Started daptomycin fever work up done as above-was neg  Was afebrile from 6/4-until 6/11- is now having low grade fever X 72 hrs ? Source- has foley ( placed on 09/20/18) and left IJ( placed on 09/21/18) that need to be removed . Need to send blood cultures Check morning cortisol Repeat quantiferon gold as the first was indeterminate  Encephalopathyresolved ? DT? Drug reaction, compounded by worsening renal function resolved  CPK has normalized  septic shock Hypotension -resolved  AKI- started dialysison 09/22/18- -  Making > 4 L of urine Off dialysis Cr around 8.63  Left DVT  ThrombocytopeniaPlatelet countwasDeclining since 09/22/18-HIT positive, changed toargatroban and platelet has normalized  Anemia  Discussed the management with patient and care team

## 2018-10-02 NOTE — Progress Notes (Signed)
Occupational Therapy Treatment Patient Details Name: Isaac Cross MRN: [id number] DOB: [birth year] Today's Date: 10/02/2018    History of present illness Pt is a 58 y.o. male presenting to hospital 09/12/18 after rolling OOB landing on L knee; pt with increased dizziness x2 days, fevers x3 days, and generalized weakness.  S/p B TKR 09/01/18 in [place] (pt reports discharging home same day).  Pt admitted with sepsis, acute renal failure, and B TKR.  Pt s/p L knee 5/31 and R knee 6/1 I&D with poly exchange.  Pt noted with severe encephalopathy from DT's with extensive ETOH abuse.  L LE DVT noted.  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  HD started 6/5 with L IJ temporary HD catheter.  PMH includes B TKR 09/01/18 in [place], DM, asthma, R vein surgery, COPD.   OT comments  Pt seen for OT treatment on this date. Upon arrival to room pt asleep in bed with room lights off and blinds closed. Pt awoke to VC's from this therapist, but had difficulty opening eyes initially.  Pt A&O x 4, but reporting 7/10 pain in BLE. After taking some time to wake up, pt agreeable to OT tx. Pt assisted with bed mobility on this date. Pt agreeable to attempt sitting EOB for functional task. Pt required mod assist with heavy multimodal cueing for hand placement, sequencing, and initiation of movements on this date. Continues to appear limited by cognitive status. Once sitting EOB pt had difficulty sustaining seated posture EOB. VC's and mod assist provided by this therapist to assist pt with lateral scoots to reposition self in bed. Pt declined further EOB activity requested to return to supine. OT instructed pt in log roll technique to facilitate improved independence with bed mobility on this date. Pt completed sit>sup with mod assist and multimodal cueing. Afterward, OT assisted pt with set-up of lunch tray. Pt appears to have been asleep most of the day and had not attempted to eat his lunch or contact family members. OT provided  education on importance of maintaining appropriate sleep-wake cycles while in the hospital to maximize mobility and minimize risk of confusion/disorientation. Pt verbalized understanding of education provided. Pt making progress toward goals and continues to benefit from skilled OT services to maximize return to PLOF and minimize risk of future falls, injury, caregiver burden, and readmission. Will continue to follow POC. Discharge recommendation remains appropriate.     Follow Up Recommendations  CIR    Equipment Recommendations  3 in 1 bedside commode    Recommendations for Other Services      Precautions / Restrictions Precautions Precautions: Fall Precaution Comments: L IJ temporary HD catheter (Per discussion with Dr. Holley Raring and nephrology note 09/27/18 "Ok for patient to work with PT out of bed with temporary IJ dialysis catheter in place". Restrictions Weight Bearing Restrictions: Yes RLE Weight Bearing: Weight bearing as tolerated LLE Weight Bearing: Weight bearing as tolerated Other Position/Activity Restrictions: WBAT B LE's       Mobility Bed Mobility Overal bed mobility: Needs Assistance Bed Mobility: Supine to Sit     Supine to sit: HOB elevated;Mod assist Sit to supine: Mod assist   General bed mobility comments: Pt willing to attempt bed mobility on this date. Required mod assist with max multimodal cues for sequencing and hand placement with HOB elevated during sup>sit. Mod assist+cues for lateral scoots to reposition at EOB. Mod assist for sit>sup using log roll technique to promote improved independence.  Transfers Overall transfer level: Needs assistance  Balance Overall balance assessment: Needs assistance Sitting-balance support: No upper extremity supported;Feet supported Sitting balance-Leahy Scale: Fair Sitting balance - Comments: steady static sitting; dynamic sitting activities deferred due to pt cognitive status.                                    ADL either performed or assessed with clinical judgement   ADL Overall ADL's : Needs assistance/impaired                                       General ADL Comments: Max Assist for LB ADL tasks; heavy cueing for sequencing     Vision Baseline Vision/History: Wears glasses Wears Glasses: At all times Patient Visual Report: No change from baseline     Perception     Praxis      Cognition Arousal/Alertness: Awake/alert Behavior During Therapy: WFL for tasks assessed/performed Overall Cognitive Status: No family/caregiver present to determine baseline cognitive functioning Area of Impairment: Memory;Following commands;Awareness                 Orientation Level: Person;Place;Time;Situation     Following Commands: Follows one step commands with increased time;Follows one step commands inconsistently Safety/Judgement: Decreased awareness of safety;Decreased awareness of deficits   Problem Solving: Slow processing;Decreased initiation;Requires verbal cues;Requires tactile cues General Comments: pt remains generally flat affect, drowsy, fatigued, and exerted but denies all of these. Has difficulty with simple commands for bed mobility.        Exercises Other Exercises Other Exercises: Pt asssited with attempt at functional transfer on this date. Limited by cognitive status. Able to sit EOB, but not safe to attempt addtional functional mobility. Fatigued after <5 min. Attempted to lay back down and declined EOB activity.   Shoulder Instructions       General Comments      Pertinent Vitals/ Pain       Pain Score: 7  Pain Descriptors / Indicators: Sore;Discomfort;Grimacing Pain Intervention(s): Limited activity within patient's tolerance;Monitored during session;Repositioned  Home Living                                          Prior Functioning/Environment              Frequency  Min  3X/week        Progress Toward Goals  OT Goals(current goals can now be found in the care plan section)  Progress towards OT goals: Progressing toward goals  Acute Rehab OT Goals Patient Stated Goal: to go home OT Goal Formulation: With patient Time For Goal Achievement: 10/09/18 Potential to Achieve Goals: Good  Plan Discharge plan remains appropriate;Frequency remains appropriate    Co-evaluation                 AM-PAC OT "6 Clicks" Daily Activity     Outcome Measure   Help from another person eating meals?: None Help from another person taking care of personal grooming?: None Help from another person toileting, which includes using toliet, bedpan, or urinal?: A Lot Help from another person bathing (including washing, rinsing, drying)?: A Lot Help from another person to put on and taking off regular upper body clothing?: A Little Help from another person to put on and  taking off regular lower body clothing?: A Lot 6 Click Score: 17    End of Session    OT Visit Diagnosis: Other abnormalities of gait and mobility (R26.89);History of falling (Z91.81);Pain Pain - Right/Left: Left(R/L) Pain - part of body: Knee   Activity Tolerance Patient limited by fatigue;Patient limited by lethargy   Patient Left in bed;with call bell/phone within reach;with bed alarm set   Nurse Communication Other (comment)(Catheter bag full at end of session.)        Time: 1343-1406 OT Time Calculation (min): 23 min  Charges: OT General Charges $OT Visit: 1 Visit OT Treatments $Self Care/Home Management : 23-37 mins  Shara Blazing, M.S., OTR/L Ascom: [phone] 10/02/18, 3:29 PM

## 2018-10-02 NOTE — Progress Notes (Signed)
Physical Therapy Treatment Patient Details Name: Isaac Cross MRN: [id number] DOB: [birth year] Today's Date: 10/02/2018    History of Present Illness Pt is a 58 y.o. male presenting to hospital 09/12/18 after rolling OOB landing on L knee; pt with increased dizziness x2 days, fevers x3 days, and generalized weakness.  S/p B TKR 09/01/18 in [place] (pt reports discharging home same day).  Pt admitted with sepsis, acute renal failure, and B TKR.  Pt s/p L knee 5/31 and R knee 6/1 I&D with poly exchange.  Pt noted with severe encephalopathy from DT's with extensive ETOH abuse.  L LE DVT noted.  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  HD started 6/5 with L IJ temporary HD catheter.  PMH includes B TKR 09/01/18 in [place], DM, asthma, R vein surgery, COPD.    PT Comments    Patient reclining in bed at start of visit. Agreed to participate in physical therapy. Able to state time of day, name and DOB, and that he had knee surgery correctly. Continues to require extended time to follow commands. Agrees but long delay between verbalizing understanding/agreement to command and physical action to follow, to the point that he is suspected to have forgotten the command. At start of visit, patient declines assistance but agrees he is waiting for clinician at times despite clinician being ready and stating so. Patient stated he had to urinate upon coming to sit with CGA, then when advised he had a urinary catheter, eventually stated he had to have a BM. Pt transferred elevated bed to [facility] using RW with CGA and prolonged time to initiate and follow through with movement. Bed soiled, changed for clean sheets by clinician while pt practiced sitting balance while having BM. Patient also participated in gown change while sitting on BSC and practiced sequencing motor skills and following directions. Patient unsure when he finished. Pt practiced standing balance at edge of BSC during total assist pericare then transferred to  elevated bed with CGA-minA to help control RW that caught in room equipment. Pt fatigued quickly and requested to sit prior to being clean. Patient became diaphoretic and started drooling after transferring back to bed and although he agreed to complete further sit <> stands at edge of bed, seemed less cognitively aware. Pt moved sit to supine with mod A due to fatigue and increased difficulty following commands. Vitals taken after moving to supine were WNL. Patient scooted up in bed with mod A +2 and draw sheet. Patient is making mild progress towards goals and was willing to complete functional mobility this evening, but continues to have low tolerance for mobility and altered mentation. Nursing informed of patient's response to treatment. Patient would benefit from continued physical therapy to address remaining impairments and functional limitations to work towards stated goals and return to PLOF or maximal functional independence.    Follow Up Recommendations  CIR     Equipment Recommendations  3in1 (PT);Other (comment)    Recommendations for Other Services       Precautions / Restrictions Precautions Precautions: Fall Precaution Comments: L IJ temporary HD catheter (Per discussion with Dr. Holley Raring and nephrology note 09/27/18 "Ok for patient to work with PT out of bed with temporary IJ dialysis catheter in place". Restrictions Weight Bearing Restrictions: Yes RLE Weight Bearing: Weight bearing as tolerated LLE Weight Bearing: Weight bearing as tolerated Other Position/Activity Restrictions: WBAT B LE's    Mobility  Bed Mobility Overal bed mobility: Needs Assistance Bed Mobility: Supine to Sit;Sit to Supine  Supine to sit: HOB elevated;Min assist Sit to supine: Mod assist   General bed mobility comments: Patient willing to attempt mobility. Able to go supine to sit with min A to move legs and required prolonged processing time. Required mod A for sit to supine due to weakness and  urgency to get back to bed due to feeling tired and diaphoretic.  Transfers Overall transfer level: Needs assistance Equipment used: Rolling walker (2 wheeled) Transfers: Sit to/from Stand Sit to Stand: Min guard;Min assist         General transfer comment: Transfered elevated bed to [facility] with minGuard and heavy cuing for sequencing and for hand placement. Moves very slowly. Required min A to transfer BSC to edge of elevated bed using RW. Need help for walker navigation when it cought on obsticals in room. Able to request assistance with moving objects in room. Very fatigued at this point, diaphoretic and wanted to lie down.  Ambulation/Gait         Gait velocity: decreased   General Gait Details: pt able to take a few steps to pivot from edge of bed to [facility] and back during transfers.   Stairs             Wheelchair Mobility    Modified Rankin (Stroke Patients Only)       Balance Overall balance assessment: Needs assistance Sitting-balance support: No upper extremity supported;Feet supported Sitting balance-Leahy Scale: Fair Sitting balance - Comments: steady static sitting until fatigued, then leaning down to R elbow. Postural control: Right lateral lean Standing balance support: Bilateral upper extremity supported;During functional activity Standing balance-Leahy Scale: Fair Standing balance comment: requires BUE support on RW                            Cognition Arousal/Alertness: Lethargic Behavior During Therapy: WFL for tasks assessed/performed Overall Cognitive Status: No family/caregiver present to determine baseline cognitive functioning Area of Impairment: Memory;Following commands;Awareness;Attention;Problem solving                 Orientation Level: Person;Place;Situation;Time Current Attention Level: Selective   Following Commands: Follows one step commands with increased time;Follows one step commands  inconsistently Safety/Judgement: Decreased awareness of safety;Decreased awareness of deficits   Problem Solving: Slow processing;Decreased initiation;Requires verbal cues;Requires tactile cues General Comments: pt remains generally flat affect, drowsy, fatigued, and exerted. Has difficulty with simple commands for bed mobility. Asks for clinician to wait so he can do it when offered assistance at start of visit, but accepted assistence towards end.      Exercises Other Exercises Other Exercises: seated edge of bed trunk stability, practiced standing balance able to stand up to 1 minutes with RW at edge of BSC. Completed toileting sitting on BSC without back support. Practiced following commands for dressing and donning clean gown while seated on BSC. Other Exercises: agreed to continued sit <> stand practice from elevated bed after toileting, but did not feel well and needed to lay back down. Diaphoretic and having more difficulty communicating.    General Comments        Pertinent Vitals/Pain Pain Assessment: Faces Faces Pain Scale: Hurts even more Pain Location: knees when moving Pain Intervention(s): Monitored during session;Repositioned;Limited activity within patient's tolerance    Home Living                      Prior Function  PT Goals (current goals can now be found in the care plan section) Acute Rehab PT Goals Patient Stated Goal: to go home PT Goal Formulation: With patient Time For Goal Achievement: 10/13/18 Potential to Achieve Goals: Fair Additional Goals Additional Goal #1: Assess OOB mobility and update goals/POC as appropriate once R femoral arterial line is removed and pt medically appropriate for OOB mobility Progress towards PT goals: Progressing toward goals    Frequency    7X/week      PT Plan Current plan remains appropriate    Co-evaluation              AM-PAC PT "6 Clicks" Mobility   Outcome Measure  Help needed  turning from your back to your side while in a flat bed without using bedrails?: A Little Help needed moving from lying on your back to sitting on the side of a flat bed without using bedrails?: A Little Help needed moving to and from a bed to a chair (including a wheelchair)?: A Little Help needed standing up from a chair using your arms (e.g., wheelchair or bedside chair)?: A Little Help needed to walk in hospital room?: A Lot Help needed climbing 3-5 steps with a railing? : Total 6 Click Score: 15    End of Session Equipment Utilized During Treatment: Gait belt Activity Tolerance: Other (comment)(Patient tolerated treatment fair. He became diaphoretic and fatigued, so he was returned to bed. BP WNL after lying supine for a few minutes. O2 sat and HR WFL) Patient left: in bed;with call bell/phone within reach;with bed alarm set Nurse Communication: Mobility status(results of session) PT Visit Diagnosis: Other abnormalities of gait and mobility (R26.89);Muscle weakness (generalized) (M62.81);Difficulty in walking, not elsewhere classified (R26.2);Pain Pain - Right/Left: Right(bilateral knees) Pain - part of body: Knee     Time: 1755-1845 PT Time Calculation (min) (ACUTE ONLY): 50 min  Charges:  $Therapeutic Activity: 38-52 mins                     Everlean Alstrom. Graylon Good, PT, DPT 10/02/18, 7:15 PM

## 2018-10-03 DIAGNOSIS — G4733 Obstructive sleep apnea (adult) (pediatric): Secondary | ICD-10-CM

## 2018-10-03 DIAGNOSIS — R809 Proteinuria, unspecified: Secondary | ICD-10-CM

## 2018-10-03 DIAGNOSIS — F418 Other specified anxiety disorders: Secondary | ICD-10-CM

## 2018-10-03 DIAGNOSIS — E1129 Type 2 diabetes mellitus with other diabetic kidney complication: Secondary | ICD-10-CM

## 2018-10-03 LAB — PROTIME-INR
INR: 2.9 — ABNORMAL HIGH (ref 0.8–1.2)
Prothrombin Time: 30.2 seconds — ABNORMAL HIGH (ref 11.4–15.2)

## 2018-10-03 LAB — BASIC METABOLIC PANEL
Anion gap: 13 (ref 5–15)
BUN: 37 mg/dL — ABNORMAL HIGH (ref 6–20)
CO2: 24 mmol/L (ref 22–32)
Calcium: 8.5 mg/dL — ABNORMAL LOW (ref 8.9–10.3)
Chloride: 101 mmol/L (ref 98–111)
Creatinine, Ser: 8.38 mg/dL — ABNORMAL HIGH (ref 0.61–1.24)
GFR calc Af Amer: 7 mL/min — ABNORMAL LOW (ref >60)
GFR calc non Af Amer: 6 mL/min — ABNORMAL LOW (ref >60)
Glucose, Bld: 101 mg/dL — ABNORMAL HIGH (ref 70–99)
Potassium: 3.7 mmol/L (ref 3.5–5.1)
Sodium: 138 mmol/L (ref 135–145)

## 2018-10-03 LAB — CBC
HCT: 27.8 % — ABNORMAL LOW (ref 39.0–52.0)
Hemoglobin: 8.8 g/dL — ABNORMAL LOW (ref 13.0–17.0)
MCH: 26 pg (ref 26.0–34.0)
MCHC: 31.7 g/dL (ref 30.0–36.0)
MCV: 82 fL (ref 80.0–100.0)
Platelets: 494 10*3/uL — ABNORMAL HIGH (ref 150–400)
RBC: 3.39 MIL/uL — ABNORMAL LOW (ref 4.22–5.81)
RDW: 15.2 % (ref 11.5–15.5)
WBC: 10.4 10*3/uL (ref 4.0–10.5)
nRBC: 0 % (ref 0.0–0.2)

## 2018-10-03 LAB — GLUCOSE, CAPILLARY
Glucose-Capillary: 90 mg/dL (ref 70–99)
Glucose-Capillary: 86 mg/dL (ref 70–99)
Glucose-Capillary: 122 mg/dL — ABNORMAL HIGH (ref 70–99)
Glucose-Capillary: 90 mg/dL (ref 70–99)
Glucose-Capillary: 91 mg/dL (ref 70–99)

## 2018-10-03 LAB — CORTISOL-AM, BLOOD: Cortisol - AM: 4 ug/dL — ABNORMAL LOW (ref 6.7–22.6)

## 2018-10-03 LAB — APTT
aPTT: 106 seconds — ABNORMAL HIGH (ref 24–36)
aPTT: 93 seconds — ABNORMAL HIGH (ref 24–36)

## 2018-10-03 MED ORDER — WARFARIN SODIUM 5 MG PO TABS
5.0000 mg | ORAL_TABLET | Freq: Once | ORAL | Status: AC
  Administered 2018-10-03: 5 mg via ORAL
  Filled 2018-10-03: qty 1

## 2018-10-03 NOTE — Progress Notes (Signed)
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 Lipitor [Atorvastatin] Hives and Itching  . Other   . Viagra  [Sildenafil Citrate]     vision loss    Patient Measurements: Height: 6\' 4"  (193 cm) Weight: (!) 343 lb 14.7 oz (156 kg) IBW/kg (Calculated) : 86.8  Vital Signs: Temp: 99.8 F (37.7 C) (06/15 2047) Temp Source: Oral (06/15 2047) BP: 160/80 (06/15 2047) Pulse Rate: 93 (06/15 2047)  Labs: Recent Labs    10/01/18 0427 10/01/18 0745  10/02/18 0506 10/02/18 0800 10/02/18 1037 10/03/18 0302  HGB 8.5*  --   --  8.7*  --   --  8.8*  HCT 27.0*  --   --  27.1*  --   --  27.8*  PLT 499*  --   --  503*  --   --  494*  APTT 97* 86*   < > 103* 85* 80* 106*  LABPROT 27.5*  --   --  29.3*  --   --  30.2*  INR 2.6*  --   --  2.8*  --   --  2.9*  CREATININE  --  8.74*  --  8.63*  --   --  8.38*  CKTOTAL  --   --   --  114  --   --   --    < > = values in this interval not displayed.    Estimated Creatinine Clearance: 15.2 mL/min (A) (by C-G formula based on SCr of 8.38 mg/dL (H)).   Medical History: Past Medical History:  Diagnosis Date  . Allergy   . Anxiety   . Arthritis    knees  . Asthma, mild intermittent, well-controlled   . Chronic obstructive asthma ([place])   . COPD (chronic obstructive pulmonary disease) ([place])   . Depression with anxiety   . Diabetes ([place])   . GERD (gastroesophageal reflux disease)   . HBP (high blood pressure)   . High cholesterol   . Renal cyst, right   . Sleep apnea    CPAP  . Swelling     Medications:  Medications Prior to Admission  Medication Sig Dispense Refill Last Dose  . acetaminophen (TYLENOL) 500 MG tablet Take 1 tablet (500 mg total) by mouth every 6 (six) hours as needed. 90 tablet 0 09/12/2018 at 0800  . albuterol (PROVENTIL HFA;VENTOLIN HFA) 108 (90 Base)  MCG/ACT inhaler INHALE 2 PUFFS INTO THE LUNGS 4 (FOUR) TIMES DAILY. 6.7 Inhaler 0 09/12/2018 at 0800  . aspirin 81 MG tablet Take 81 mg by mouth daily.   09/12/2018 at 0800  . atenolol (TENORMIN) 25 MG tablet Take 1 tablet (25 mg total) by mouth every evening. 90 tablet 1 09/11/2018 at 2000  . cephALEXin (KEFLEX) 500 MG capsule Take 500 mg by mouth 3 (three) times daily.   09/12/2018 at 0800  . citalopram (CELEXA) 20 MG tablet Take 1 tablet (20 mg total) by mouth daily. 90 tablet 1 09/12/2018 at 0800  . fluticasone furoate-vilanterol (BREO ELLIPTA) 100-25 MCG/INH AEPB Inhale 1 puff into the lungs daily. 180 each 1 09/12/2018 at 0800  . Multiple Vitamins-Minerals (MENS MULTIVITAMIN PLUS) TABS Take by mouth.   09/12/2018 at 0800  . Omega-3 Fatty Acids (FISH OIL) 1000 MG CAPS Take by mouth daily.   09/12/2018 at 0800  . oxyCODONE-acetaminophen (PERCOCET/ROXICET) 5-325 MG tablet  Take 1 tablet by mouth every 4 (four) hours.   09/12/2018 at 0800  . rosuvastatin (CRESTOR) 20 MG tablet TAKE 1 TABLET BY MOUTH EVERY DAY 90 tablet 1 09/12/2018 at 0800  . SENNA-PLUS 8.6-50 MG tablet Take 2 tablets by mouth every morning.   09/12/2018 at 0800  . sulfamethoxazole-trimethoprim (BACTRIM DS) 800-160 MG tablet Take 1 tablet by mouth 2 (two) times a day.   09/12/2018 at 0800  . XARELTO 10 MG TABS tablet Take 10 mg by mouth daily.    09/12/2018 at 0800  . cetirizine (ZYRTEC) 10 MG tablet Take 10 mg by mouth daily.   prn at prn  . diclofenac sodium (VOLTAREN) 1 % GEL APPLY 4 GRAMS TOPICALLY FOUR TIMES A DAY 100 g 2 prn at prn  . lidocaine (XYLOCAINE) 2 % solution Use as directed 15 mLs in the mouth or throat as needed for mouth pain. 100 mL 0 prn at prn  . triamcinolone cream (KENALOG) 0.1 % triamcinolone acetonide 0.1 % topical cream   prn at prn    Assessment: 58 yr male had B/l knee replacements Sep 01, 2018 was admitted with fever, AKI/ Platelet count has been declining since 6/5. He was started on heparin 6/3. Child Pugh  score estimated to be 5-6  Baseline aPTT: 35s 06/6   @1525 :aPTT 91s  plt 102 06/6   @1823 :aPTT 64s       06/6   @2013 :aPTT 63s   06/07 @0330  aPTT 60s  plt   99 06/08 @0540  aPTT 59s  plt 150 06/09 @0326  aPTT 55s  plt 227   INR 2.0  Warfarin 5mg  06/10 @0512  aPTT 71s  plt 274   INR 2.2  Warfarin 5mg  06/11 @ 0253 aPTT 88s plt 373   INR 2.6 warfarin 5mg  06/11 @ 0817 aPTT 80s 6/12 @ 0504 INR: 1.6 APTT 49  06/12 @ 1900 : aPTT 60 6/13 @ 0025 aPTT: 75, INR: 2.2  6/14 @ 0427 aPTT 97, INR 2.6 6/14 @ 0745 aPTT 86  6/14 @ 1041 aPTT 82 6/15 @ 0506 aPTT 103, INR 2.8 6/15 @ 0800 aPTT 85 6/15 @ 1037 aPTT 80  HIT lab resulted 6/8 1740 - Abnormal 1.854, ordered SRA  Goal of Therapy:  aPTT 50-90 seconds Monitor platelets by anticoagulation protocol: Yes INR: 2-3 (however - may elevate higher than this while on Warfarin and Argatroban - it will come back down when argatroban stopped)  Plan:  ARGATROBAN:   06/16 @ 0300 aPTT 106 seconds supratherapeutic. Will reduce rate by 30% to 0.157 mcg/kg/min and will recheck aPTT @ 0800, CBC trending normally, will continue to monitor. INR has been therapeutic x 4 consecutive levels and patient has been on IV anticoagulant bridge for > 5 days. Recommend stopping IV anticoagulant bridge therapy as patient is now having therapeutic INRs.   WARFARIN: INR 2.9. Level is therapeutic and now slowing down remaining steady. Will give patient warfarin 5 mg PO x 1 tonight and will continue to trend INRs and monitor w/ am labs.  Tobie Lords, PharmD Clinical Pharmacist 10/03/2018 5:43 AM

## 2018-10-03 NOTE — TOC Progression Note (Signed)
Transition of Care [facility]) - Progression Note    Patient Details  Name: Isaac Cross MRN: [id number] Date of Birth: [birth year]  Transition of Care [facility] CM/SW [employer], RN Phone Number: 10/03/2018, 3:53 PM  Clinical Narrative:    Spoke with the Luanna Salk the patient's wife and gave an update on the situation, I let her know that the ID physician has been asked to call her.  She stated that she wanted to ask some questions on where the infection is and to why the patient is on specific antibiotics, she is concerned that his mental capacity is declining.  I encouraged her to ask the physician when they call her these questions and gave her my contact information to call for an update if she wished.  I explained that at this moment we are still treating him and he is not ready to DC yet.She stated understanding   Expected Discharge Plan: [place] Barriers to Discharge: Waiting for outpatient dialysis, Other (comment)(acute renal currently)  Expected Discharge Plan and Services Expected Discharge Plan: [place] In-house Referral: Clinical Social Work Discharge Planning Services: CM Consult Post Acute Care Choice: [place] arrangements for the past 2 months: [place]: (Patient stated that he is open to home health. ) Date [place]: 09/18/18 Time [place]: [place] Representative spoke with at [place]: [name]- unsure of when patient will discharge.   Social Determinants of Health (SDOH) Interventions    Readmission Risk Interventions No flowsheet data found.

## 2018-10-03 NOTE — Progress Notes (Addendum)
   Orthostatic vitals established during PT session. While seated EOB (after 3 sit-to-stand transfers)pt collapsed into a left leaning slump leaning on Left elbow, began to have shaking in BUE, report assisted to sitting upright by therapy tech, pt then reporting 'I'm dizzy- I need to lay down." Pt returned to supine with Total assist +2. Unable to get an immediate BP for 3-4 minutes, but patient remained dizzy for several minutes. Ortho vitals established in supine and sitting, but patient no longer orthostatic. *Of note: pt was dizzy and diaphoretic with PT last night.     10/03/18 1120  Therapy Vitals  Patient Position (if appropriate) Orthostatic Vitals  Orthostatic Lying   BP- Lying 123/76  Pulse- Lying 94  Orthostatic Sitting  BP- Sitting 104/76  Pulse- Sitting 99   **of note, patient was 157/42mmHG at 8:00AM this date with nursing.   1:53 PM, 10/03/18 Etta Grandchild, PT, DPT Physical Therapist - [facility]  [phone] [facility])

## 2018-10-03 NOTE — Progress Notes (Signed)
[facility]  ROUNDING NOTE   Subjective:   Patient complains of poor appetite and wants water.   Dialysis catheter removed yesterday.   UOP 3562mL  Objective:  Vital signs in last 24 hours:  Temp:  [98.8 F (37.1 C)-102.3 F (39.1 C)] 99.9 F (37.7 C) (06/16 0803) Pulse Rate:  [90-93] 93 (06/16 0803) Resp:  [18] 18 (06/16 0803) BP: (124-166)/(78-91) 157/85 (06/16 0803) SpO2:  [94 %-100 %] 100 % (06/16 0803)  Weight change:  Filed Weights   09/29/18 0630 09/29/18 1445 09/29/18 1823  Weight: (!) 157.1 kg (!) 157 kg (!) 156 kg    Intake/Output: I/O last 3 completed shifts: In: 495.5 [I.V.:181.5; IV Piggyback:314] Out: 6200 [Urine:6200]   Intake/Output this shift:  Total I/O In: -  Out: 500 [Urine:500]  Physical Exam: General: NAD  Head: Moist oral mucosal membranes  Eyes: Anicteric   Lungs:  clear  Heart: regular  Abdomen:  Soft, nontender, obese   Extremities: + peripheral edema  Neurologic: Awake, alert, conversant  Skin: warm        Basic Metabolic Panel: Recent Labs  Lab 09/29/18 0504 09/29/18 1657 09/30/18 0025 09/30/18 0939 10/01/18 0745 10/02/18 0506 10/03/18 0302  NA 138  --   --  135 134* 137 138  K 3.2*  --  3.5 3.3* 3.4* 3.4* 3.7  CL 101  --   --  98 98 99 101  CO2 22  --   --  26 25 24 24   GLUCOSE 94  --   --  100* 94 106* 101*  BUN 61*  --   --  36* 37* 36* 37*  CREATININE 10.82*  --   --  8.21* 8.74* 8.63* 8.38*  CALCIUM 8.1*  --   --  7.7* 7.9* 8.3* 8.5*  MG  --   --   --   --  1.7 1.9  --   PHOS  --  5.3*  --   --   --   --   --     Liver Function Tests: Recent Labs  Lab 09/29/18 1642  AST 38  ALT 22  ALKPHOS 58  BILITOT 0.7  PROT 5.6*  ALBUMIN 1.8*   No results for input(s): LIPASE, AMYLASE in the last 168 hours. No results for input(s): AMMONIA in the last 168 hours.  CBC: Recent Labs  Lab 09/29/18 0504 09/30/18 0535 10/01/18 0427 10/02/18 0506 10/03/18 0302  WBC 11.2* 10.2 10.9* 10.3 10.4   HGB 8.6* 8.4* 8.5* 8.7* 8.8*  HCT 26.8* 25.7* 27.0* 27.1* 27.8*  MCV 81.5 81.6 84.1 81.6 82.0  PLT 499* 504* 499* 503* 494*    Cardiac Enzymes: Recent Labs  Lab 09/29/18 0504 10/02/18 0506  CKTOTAL 89 114    BNP: Invalid input(s): POCBNP  CBG: Recent Labs  Lab 10/02/18 1706 10/02/18 2232 10/03/18 0223 10/03/18 0802 10/03/18 1208  GLUCAP 94 81 90 86 122*    Microbiology: Results for orders placed or performed during the hospital encounter of 09/12/18  Blood Culture (routine x 2)     Status: None   Collection Time: 09/12/18  9:56 AM   Specimen: BLOOD LEFT HAND  Result Value Ref Range Status   Specimen Description BLOOD LEFT HAND  Final   Special Requests   Final    BOTTLES DRAWN AEROBIC AND ANAEROBIC Blood Culture adequate volume   Culture   Final    NO GROWTH 5 DAYS Performed at [facility], [address] [place],  [place] [address]    Report Status 09/17/2018 FINAL  Final  Urine culture     Status: None   Collection Time: 09/12/18 10:01 AM   Specimen: Urine, Random  Result Value Ref Range Status   Specimen Description   Final    URINE, RANDOM Performed at [facility], [address], [place], [place] [address]    Special Requests   Final    NONE Performed at [facility], [address] [place], [place] [address]    Culture   Final    NO GROWTH Performed at [facility], [place] [address] [place], [place] [address]    Report Status 09/13/2018 FINAL  Final  SARS Coronavirus 2 (CEPHEID- Performed in [place] hospital lab), Hosp Order     Status: None   Collection Time: 09/12/18 10:02 AM   Specimen: Nasopharyngeal Swab  Result Value Ref Range Status   SARS Coronavirus 2 NEGATIVE NEGATIVE Final    Comment: (NOTE) If result is NEGATIVE SARS-CoV-2 target nucleic acids are NOT DETECTED. The SARS-CoV-2 RNA is generally detectable in upper and lower  respiratory specimens during the acute phase of infection. The  lowest  concentration of SARS-CoV-2 viral copies this assay can detect is 250  copies / mL. A negative result does not preclude SARS-CoV-2 infection  and should not be used as the sole basis for treatment or other  patient management decisions.  A negative result may occur with  improper specimen collection / handling, submission of specimen other  than nasopharyngeal swab, presence of viral mutation(s) within the  areas targeted by this assay, and inadequate number of viral copies  (<250 copies / mL). A negative result must be combined with clinical  observations, patient history, and epidemiological information. If result is POSITIVE SARS-CoV-2 target nucleic acids are DETECTED. The SARS-CoV-2 RNA is generally detectable in upper and lower  respiratory specimens dur ing the acute phase of infection.  Positive  results are indicative of active infection with SARS-CoV-2.  Clinical  correlation with patient history and other diagnostic information is  necessary to determine patient infection status.  Positive results do  not rule out bacterial infection or co-infection with other viruses. If result is PRESUMPTIVE POSTIVE SARS-CoV-2 nucleic acids MAY BE PRESENT.   A presumptive positive result was obtained on the submitted specimen  and confirmed on repeat testing.  While 2019 novel coronavirus  (SARS-CoV-2) nucleic acids may be present in the submitted sample  additional confirmatory testing may be necessary for epidemiological  and / or clinical management purposes  to differentiate between  SARS-CoV-2 and other Sarbecovirus currently known to infect humans.  If clinically indicated additional testing with an alternate test  methodology [phone]) is advised. The SARS-CoV-2 RNA is generally  detectable in upper and lower respiratory sp ecimens during the acute  phase of infection. The expected result is Negative. Fact Sheet for Patients:   StrictlyIdeas.no Fact Sheet for Healthcare Providers: BankingDealers.co.za This test is not yet approved or cleared by the [place] FDA and has been authorized for detection and/or diagnosis of SARS-CoV-2 by FDA under an Emergency Use Authorization (EUA).  This EUA will remain in effect (meaning this test can be used) for the duration of the COVID-19 declaration under Section 564(b)(1) of the Act, 21 U.S.C. section 360bbb-3(b)(1), unless the authorization is terminated or revoked sooner. Performed at [facility], [address], [place], [place] [address]   Blood Culture (routine x 2)  Status: None   Collection Time: 09/12/18 10:06 AM   Specimen: BLOOD  Result Value Ref Range Status   Specimen Description BLOOD LEFT ANTECUBITAL  Final   Special Requests   Final    BOTTLES DRAWN AEROBIC AND ANAEROBIC Blood Culture adequate volume   Culture   Final    NO GROWTH 5 DAYS Performed at [facility], [address] [place], [place] [address]    Report Status 09/17/2018 FINAL  Final  MRSA PCR Screening     Status: None   Collection Time: 09/12/18  7:04 PM   Specimen: Nasal Mucosa; Nasopharyngeal  Result Value Ref Range Status   MRSA by PCR NEGATIVE NEGATIVE Final    Comment:        The GeneXpert MRSA Assay (FDA approved for NASAL specimens only), is one component of a comprehensive MRSA colonization surveillance program. It is not intended to diagnose MRSA infection nor to guide or monitor treatment for MRSA infections. Performed at [facility], [address] [place], [place] [address]   C difficile quick scan w PCR reflex     Status: None   Collection Time: 09/14/18  2:21 PM   Specimen: STOOL  Result Value Ref Range Status   C Diff antigen NEGATIVE NEGATIVE Final   C Diff toxin NEGATIVE NEGATIVE Final   C Diff interpretation No C. difficile detected.  Final    Comment: Performed at  [facility], [address] [place], [place] [address]  Group A Strep by PCR     Status: None   Collection Time: 09/15/18  1:53 PM  Result Value Ref Range Status   Group A Strep by PCR NOT DETECTED NOT DETECTED Final    Comment: Performed at [facility], [address], [place], [place] [address]  Body fluid culture     Status: None   Collection Time: 09/15/18  5:13 PM   Specimen: KNEE  Result Value Ref Range Status   Specimen Description   Final    KNEE Performed at [facility], [address], [place], [place] [address]    Special Requests   Final    NONE Performed at [facility], [address] [place], [place] [address]    Gram Stain   Final    RARE RBCS FEW WBC SEEN NO ORGANISMS SEEN Performed at [facility], [address], [place], [place] [address]    Culture   Final    NO GROWTH 3 DAYS Performed at [facility], [place] [address], [place], [place] [address]    Report Status 09/19/2018 FINAL  Final  Body fluid culture     Status: None   Collection Time: 09/16/18  8:55 AM   Specimen: KNEE  Result Value Ref Range Status   Specimen Description KNEE RIGHT  Final   Special Requests NONE  Final   Gram Stain   Final    RARE WBC PRESENT, PREDOMINANTLY PMN NO ORGANISMS SEEN    Culture   Final    NO GROWTH 3 DAYS Performed at [facility], [place] [address], [place], [place] [address]    Report Status 09/20/2018 FINAL  Final  Aerobic/Anaerobic Culture (surgical/deep wound)     Status: None   Collection Time: 09/17/18 11:32 AM   Specimen: Wound  Result Value Ref Range Status   Specimen Description   Final    TISSUE LEFT KNEE 1 Performed at [facility], [address],  [place], [place] [address]    Special Requests PATIENT ON FOLLOWING MAXIPIME VANCOMYCIN  Final   Gram Stain   Final    FEW WBC PRESENT,BOTH PMN AND MONONUCLEAR NO ORGANISMS SEEN    Culture   Final    No growth aerobically or  anaerobically. Performed at [facility], [place] [address] [place], [place] [address]    Report Status 09/22/2018 FINAL  Final  Acid Fast Smear (AFB)     Status: None   Collection Time: 09/17/18 11:32 AM   Specimen: Synovium  Result Value Ref Range Status   AFB Specimen Processing Comment  Final    Comment: Tissue Grinding and Digestion/Decontamination   Acid Fast Smear Negative  Final    Comment: (NOTE) Performed At: [facility] [place], [place] [id number] Rush Farmer MD VC:[id number]    Source (AFB) TISSUE  Final    Comment: LEFT KNEE Performed at [facility], [place] [address] [place], [place] [address]   Culture, fungus without smear     Status: None (Preliminary result)   Collection Time: 09/17/18 11:34 AM   Specimen: Synovium; Other  Result Value Ref Range Status   Specimen Description   Final    SYNOVIAL Performed at [facility], [address] [place], [place] [address]    Special Requests   Final    NONE Performed at [facility], [address] [place], [place] [address]    Culture   Final    NO FUNGUS ISOLATED AFTER 13 DAYS Performed at [facility], [place] [address], [place], [place] [address]    Report Status PENDING  Incomplete  Acid Fast Culture with reflexed sensitivities     Status: None   Collection Time: 09/17/18 11:36 AM   Specimen: Synovium  Result Value Ref Range Status   Acid Fast Culture CANC  Final    Comment: (NOTE) Test cancelled at client's request.      Cancelled per [place] Erp 09/21/2018 Performed At: [facility] [place], [place] [id number] Rush Farmer MD MA:[id number]    Source of Sample TISSUE  Final    Comment: RIGHT KNEE Performed at [facility], [place] [address], [place], [place] [address]   Aerobic/Anaerobic Culture (surgical/deep wound)     Status: None   Collection Time: 09/17/18 11:43 AM   Specimen: Wound  Result Value Ref  Range Status   Specimen Description   Final    TISSUE LEFT KNEE 2 Performed at [facility], [address] [place], [place] [address]    Special Requests PATIENT ON FOLLOWING MAXIPIME VANCOMYCIN  Final   Gram Stain   Final    FEW WBC PRESENT, PREDOMINANTLY PMN NO ORGANISMS SEEN    Culture   Final    No growth aerobically or anaerobically. Performed at [facility], [place] [address] [place], [place] [address]    Report Status 09/22/2018 FINAL  Final  Aerobic/Anaerobic Culture (surgical/deep wound)     Status: None   Collection Time: 09/17/18 11:44 AM   Specimen: Wound  Result Value Ref Range Status   Specimen Description   Final    TISSUE LEFT KNEE 3 Performed at [facility], [address] [place], [place] [address]    Special Requests PATIENT ON FOLLOWING MAXIPIME VANCOMYCIN  Final   Gram Stain   Final    MODERATE WBC PRESENT, PREDOMINANTLY PMN NO ORGANISMS SEEN    Culture  Final    No growth aerobically or anaerobically. Performed at [facility], [place] [address] [place], [place] [address]    Report Status 09/22/2018 FINAL  Final  Aerobic/Anaerobic Culture (surgical/deep wound)     Status: None   Collection Time: 09/18/18  9:43 PM   Specimen: [facility] Other; Tissue  Result Value Ref Range Status   Specimen Description   Final    SYNOVIAL Performed at [facility], [address], [place], [place] [address]    Special Requests   Final    NONE Performed at [facility], [address] [place], [place] [address]    Gram Stain   Final    RARE WBC PRESENT, PREDOMINANTLY MONONUCLEAR NO ORGANISMS SEEN    Culture   Final    No growth aerobically or anaerobically. Performed at [facility], [place] [address] [place], [place] [address]    Report Status 09/24/2018 FINAL  Final  Aerobic/Anaerobic Culture (surgical/deep wound)     Status: None   Collection Time: 09/18/18  9:44 PM   Specimen: [facility] Other; Tissue   Result Value Ref Range Status   Specimen Description   Final    SYNOVIAL Performed at [facility], [address] [place], [place] [address]    Special Requests   Final    RIGHT KNEE Performed at [facility], [place], [place] [address]    Gram Stain   Final    FEW WBC PRESENT,BOTH PMN AND MONONUCLEAR NO ORGANISMS SEEN    Culture   Final    No growth aerobically or anaerobically. Performed at [facility], [place] [address] [place], [place] [address]    Report Status 09/24/2018 FINAL  Final  Aerobic/Anaerobic Culture (surgical/deep wound)     Status: None   Collection Time: 09/18/18  9:45 PM   Specimen: [facility] Other; Tissue  Result Value Ref Range Status   Specimen Description   Final    SYNOVIAL Performed at [facility], [address] [place], [place] [address]    Special Requests   Final    RIGHT KNEE Performed at [facility], [address] [place], [place] [address]    Gram Stain   Final    FEW WBC PRESENT, PREDOMINANTLY MONONUCLEAR NO ORGANISMS SEEN    Culture   Final    No growth aerobically or anaerobically. Performed at [facility], [place] [address] [place], [place] [address]    Report Status 09/24/2018 FINAL  Final  Culture, blood (Routine X 2) w Reflex to ID Panel     Status: None   Collection Time: 09/19/18  1:11 PM   Specimen: BLOOD  Result Value Ref Range Status   Specimen Description BLOOD LEFT ANTECUBITAL  Final   Special Requests   Final    BOTTLES DRAWN AEROBIC AND ANAEROBIC Blood Culture results may not be optimal due to an excessive volume of blood received in culture bottles   Culture   Final    NO GROWTH 5 DAYS Performed at [facility], [address] [place], [place] [address]    Report Status 09/24/2018 FINAL  Final  Culture, blood (Routine X 2) w Reflex to ID Panel     Status: None   Collection Time: 09/19/18  1:20 PM   Specimen: BLOOD  Result Value Ref  Range Status   Specimen Description BLOOD BLOOD RIGHT HAND  Final   Special Requests  Final    BOTTLES DRAWN AEROBIC AND ANAEROBIC Blood Culture results may not be optimal due to an excessive volume of blood received in culture bottles   Culture   Final    NO GROWTH 5 DAYS Performed at [facility], [address], [place], [place] [address]    Report Status 09/24/2018 FINAL  Final  Urine Culture     Status: None   Collection Time: 09/20/18  3:59 PM   Specimen: Urine, Random  Result Value Ref Range Status   Specimen Description   Final    URINE, RANDOM Performed at [facility], [address], [place], [place] [address]    Special Requests   Final    NONE Performed at [facility], [address] [place], [place] [address]    Culture   Final    NO GROWTH Performed at [facility], [place] [address], [place], [place] [address]    Report Status 09/21/2018 FINAL  Final  C difficile quick scan w PCR reflex     Status: None   Collection Time: 09/21/18  3:27 PM   Specimen: STOOL  Result Value Ref Range Status   C Diff antigen NEGATIVE NEGATIVE Final   C Diff toxin NEGATIVE NEGATIVE Final   C Diff interpretation No C. difficile detected.  Final    Comment: Performed at [facility], [address] [place], [place] [address]  Gastrointestinal Panel by PCR , Stool     Status: None   Collection Time: 09/21/18  3:27 PM   Specimen: Stool  Result Value Ref Range Status   Campylobacter species NOT DETECTED NOT DETECTED Final   Plesimonas shigelloides NOT DETECTED NOT DETECTED Final   Salmonella species NOT DETECTED NOT DETECTED Final   Yersinia enterocolitica NOT DETECTED NOT DETECTED Final   Vibrio species NOT DETECTED NOT DETECTED Final   Vibrio cholerae NOT DETECTED NOT DETECTED Final   Enteroaggregative E coli (EAEC) NOT DETECTED NOT DETECTED Final   Enteropathogenic E coli (EPEC) NOT DETECTED NOT DETECTED Final   Enterotoxigenic E  coli (ETEC) NOT DETECTED NOT DETECTED Final   Shiga like toxin producing E coli (STEC) NOT DETECTED NOT DETECTED Final   Shigella/Enteroinvasive E coli (EIEC) NOT DETECTED NOT DETECTED Final   Cryptosporidium NOT DETECTED NOT DETECTED Final   Cyclospora cayetanensis NOT DETECTED NOT DETECTED Final   Entamoeba histolytica NOT DETECTED NOT DETECTED Final   Giardia lamblia NOT DETECTED NOT DETECTED Final   Adenovirus F40/41 NOT DETECTED NOT DETECTED Final   Astrovirus NOT DETECTED NOT DETECTED Final   Norovirus GI/GII NOT DETECTED NOT DETECTED Final   Rotavirus A NOT DETECTED NOT DETECTED Final   Sapovirus (I, II, IV, and V) NOT DETECTED NOT DETECTED Final    Comment: Performed at [facility], [address] [place],  [address]  Novel Coronavirus, NAA (hospital order; send-out to ref lab)     Status: None   Collection Time: 09/29/18  4:36 PM   Specimen: Nasopharyngeal Swab; Respiratory  Result Value Ref Range Status   SARS-CoV-2, NAA NOT DETECTED NOT DETECTED Final    Comment: (NOTE) This test was developed and its performance characteristics determined by [employer]. This test has not been FDA cleared or approved. This test has been authorized by FDA under an Emergency Use Authorization (EUA). This test is only authorized for the duration of time the declaration that circumstances exist justifying the authorization of the emergency use of in vitro diagnostic tests  for detection of SARS-CoV-2 virus and/or diagnosis of COVID-19 infection under section 564(b)(1) of the Act, 21 U.S.C. 967ELF-8(B)(0), unless the authorization is terminated or revoked sooner. When diagnostic testing is negative, the possibility of a false negative result should be considered in the context of a patient's recent exposures and the presence of clinical signs and symptoms consistent with COVID-19. An individual without symptoms of COVID-19 and who is not shedding SARS-CoV-2 virus  would expect to have a negative (not detected) result in this assay. Performed  At: [facility] [address] [place], [place] [id number] Rush Farmer MD ID:[id number]    [place]  Final    Comment: Performed at [facility], [address] [place], [place] [address]  CULTURE, BLOOD (ROUTINE X 2) w Reflex to ID Panel     Status: None (Preliminary result)   Collection Time: 10/02/18  5:26 PM   Specimen: BLOOD  Result Value Ref Range Status   Specimen Description BLOOD BLOOD RIGHT FOREARM  Final   Special Requests   Final    BOTTLES DRAWN AEROBIC AND ANAEROBIC Blood Culture adequate volume   Culture   Final    NO GROWTH < 12 HOURS Performed at [facility], [address], [place], [place] [address]    Report Status PENDING  Incomplete  CULTURE, BLOOD (ROUTINE X 2) w Reflex to ID Panel     Status: None (Preliminary result)   Collection Time: 10/02/18  5:41 PM   Specimen: BLOOD  Result Value Ref Range Status   Specimen Description BLOOD BLOOD RIGHT HAND  Final   Special Requests   Final    BOTTLES DRAWN AEROBIC AND ANAEROBIC Blood Culture adequate volume   Culture   Final    NO GROWTH < 12 HOURS Performed at [facility], [address] [place], [place] [address]    Report Status PENDING  Incomplete    Coagulation Studies: Recent Labs    10/01/18 0427 10/02/18 0506 10/03/18 0302  LABPROT 27.5* 29.3* 30.2*  INR 2.6* 2.8* 2.9*    Urinalysis: No results for input(s): COLORURINE, LABSPEC, PHURINE, GLUCOSEU, HGBUR, BILIRUBINUR, KETONESUR, PROTEINUR, UROBILINOGEN, NITRITE, LEUKOCYTESUR in the last 72 hours.  Invalid input(s): APPERANCEUR    Imaging: No results found.   Medications:   . sodium chloride 5 mL/hr at 09/29/18 0242  . sodium chloride 0 mL/hr at 09/30/18 1534  . anticoagulant sodium citrate    . cefTAZidime (FORTAZ)  IV 1 g (10/02/18 1714)  . DAPTOmycin (CUBICIN)  IV 700 mg (10/01/18  1958)   . aspirin EC  81 mg Oral Daily  . atenolol  25 mg Oral Daily  . Chlorhexidine Gluconate Cloth  6 each Topical Q0600  . citalopram  20 mg Oral Daily  . feeding supplement (NEPRO CARB STEADY)  237 mL Oral BID BM  . fluticasone furoate-vilanterol  1 puff Inhalation Daily  . folic acid  1 mg Oral Daily  . insulin aspart  0-5 Units Subcutaneous QHS  . insulin aspart  0-9 Units Subcutaneous TID WC  . mouth rinse  15 mL Mouth Rinse BID  . multivitamin  1 tablet Oral Q1200  . multivitamin with minerals  1 tablet Oral Daily  . omega-3 acid ethyl esters  1 g Oral Daily  . sodium chloride flush  10-40 mL Intracatheter Q12H  . sodium chloride flush  3 mL Intravenous Q12H  . warfarin  5 mg Oral ONCE-1800  . Warfarin - Pharmacist Dosing Inpatient  Does not apply q1800   sodium chloride, sodium chloride, acetaminophen **OR** acetaminophen, albuterol, alum & mag hydroxide-simeth, anticoagulant sodium citrate, fentaNYL (SUBLIMAZE) injection, HYDROcodone-acetaminophen, iohexol, ipratropium-albuterol, menthol-cetylpyridinium **OR** phenol, menthol-cetylpyridinium **OR** phenol, metoCLOPramide **OR** metoCLOPramide (REGLAN) injection, ondansetron **OR** ondansetron (ZOFRAN) IV, sodium chloride flush, sodium chloride flush  Assessment/ Plan:  [name] is a 58 y.o. black male with hyeprtension, COPD, diabetes mellitus type II who underwent bilateral knee replacement surgeries last week. who was admitted to [facility] on 09/12/2018 for sepsis  1. Acute renal failure with metabolic acidosis  baseline creatinine of 1.07 with normal GFR in 06/12/18.  Likely severe ATN from hypotension and sepsis No indication for dialysis today. Polyuria could be post ATN diuresis - Monitor volume status - Monitor daily for dialysis need  2. Septic shock: bilateral knee replacement on Sep 01, 2018 at [facility], patient underwent I&D and polyethylene exchange on 5/31 and Rt knee on 6/1 - ID  recommends 4 weeks of ceftazidime and daptomycin (6mg /kg) until 10/31/2018  3. Hypokalemia -Replace potassium   4. Anemia with renal failure: hemoglobin 8.8 - Discussed EPO with patient.   5. Hypertension: on atenolol Orthostatic with PT.    LOS: 21 Isaac Cross 6/16/20201:32 PM

## 2018-10-03 NOTE — Progress Notes (Signed)
Date of Admission:  09/12/2018      Admitted with fever and swelling of legs /knee on 09/12/18 with AKI following b/l TKA on 09/01/18 hospital course complicated by hypotension, septic shock, worsening AKI/ Alcohol withdrawal with delirium needing sedation. 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 aspirin EC  81 mg Oral Daily  . atenolol  25 mg Oral Daily  . Chlorhexidine Gluconate Cloth  6 each Topical Q0600  . citalopram  20 mg Oral Daily  . feeding supplement (NEPRO CARB STEADY)  237 mL Oral BID BM  . fluticasone furoate-vilanterol  1 puff Inhalation Daily  . folic acid  1 mg Oral Daily  . insulin aspart  0-5 Units Subcutaneous QHS  . insulin aspart  0-9 Units Subcutaneous TID WC  . mouth rinse  15 mL Mouth Rinse BID  . multivitamin  1 tablet Oral Q1200  . multivitamin with minerals  1 tablet Oral Daily  . omega-3 acid ethyl esters  1 g Oral Daily  . sodium chloride flush  10-40 mL Intracatheter Q12H  . sodium chloride flush  3 mL Intravenous Q12H  . warfarin  5 mg Oral ONCE-1800  . Warfarin - Pharmacist Dosing Inpatient   Does not apply q1800    Objective: Vital signs in last 24 hours: Temp:  [98.8 F (37.1 C)-102.3 F (39.1 C)] 99.9 F (37.7 C) (06/16 0803) Pulse Rate:  [90-93] 93 (06/16 0803) Resp:  [18] 18 (06/16 0803) BP: (124-166)/(78-91) 157/85 (06/16 0803) SpO2:  [94 %-100 %] 100 % (06/16 0803)  PHYSICAL EXAM:  General: Awake, lethargic cooperative, no distress, appears stated age.  Lungs: b/l air entry Heart: s1s2 Abdomen: Soft, non-tender,not distended. Bowel sounds normal. No masses Extremities: b/l knee surgical dressing Skin: No rashes or lesions. Or bruising Lymph: Cervical, supraclavicular  normal. Neurologic: Grossly non-focal  Lab Results Recent Labs    10/02/18 0506 10/03/18 0302  WBC 10.3 10.4  HGB 8.7* 8.8*  HCT 27.1* 27.8*  NA 137 138  K 3.4* 3.7  CL 99 101  CO2 24 24  BUN 36* 37*  CREATININE 8.63* 8.38*    Immunology and other work up for fever ANA 09/12/18-& 09/19/18 =Neg ACE level 09/19/18 NEg Complement c4/C3 Neg 09/28/18 ANCA neg 09/28/18 Beta D glucan 09/20/18-Neg CK 09/13/18 was 4516 CK 10/02/18 is 114 TSH 5.055 on 09/15/18 Cortisol on 09/20/18 was 18.4 ( 4 pm) Quantiferon Gold on 09/21/18 indeterminate Hepatitis C from 09/21/18 NR Hepatitis A and B neg  HIV NR 09/20/18  Microbiology: [facility] 09/12/18-NG 09/19/18 BC NG 6/3 fungal culture blood  neg  Synovial fluid culture left 09/15/18 Neg Synovial fluid rt knee NG from 09/16/18 Synovial tissue culture left knee 5/31  NG NGS sent to [facility] ( 16s RNA) neg AFB/FUNGAL from left synovial tissue negative from 09/17/18 so far   Studies/Results: No results found.   Assessment/Plan: 58 yr male had B/l knee replacements Sep 01, 2018 was admitted with fever, AKI . He was taking bactrim and keflex when he came to the Ed on 09/12/18 with fever  Initially feverswere persisting inspite of IV vanco and cefepime and  there was no other source (Blood culture neg, urine culture neg, no pneumonia, ) the  left knee was aspirated and it had 8000 wbc and he was taken for a wash out on 09/17/18. Rt knee was washed out on 09/18/18 All cultures neg Pts fever was down for 36-48 hrs and started up again on 09/19/18 at 103. Central line, femoral line were removed Cefepime was changed to zosyn on 6/2 Vanco was discontinued on 09/19/18 -added anidulafungin6/3and doxy 6/3- - he also was started on stress dose steroids on the same day and was also diagnosed with left popliteal DVT on 09/20/18 and started on heparin-  Asfungal blood culture negDC anidulafungin 0n 09/26/18 Doxy Dc as well. Started daptomycin- currently on  dapto and ceftazidime-- for empiric gram positive ( MRSA) and gram negative coverage fever work up done as above-was neg  Was afebrile from 6/4-until 6/11- is now having low grade fever X 72 hrs ? Source- has foley ( placed on 09/20/18) and left IJ( placed on 09/21/18) that were removed yesterday ( 10/02/18) blood cultures Check morning cortisol ( need to have am cortisol- 4 Pm cortisol is low Repeat quantiferon gold as the first was indeterminate  Encephalopathyresolved Was thought to be due to  DT and possible  Drug reaction ( Serotonin syndrome??) , compounded by worsening renal function Has resolved  CPK has normalized  septic shock Hypotension -resolved  AKI-started dialysison 09/22/18--  Making > 4 L of urine Off dialysis Cr around 8.63 AKI limits use of vanco   Left DVT  ThrombocytopeniaPlatelet countwasDeclining since 09/22/18-HIT , changed toargatroban and platelet has normalized. Now on coumadin  Anemia  Discussed with his wife  [name] in great detail

## 2018-10-03 NOTE — Progress Notes (Signed)
Nutrition Follow-up  RD working remotely.  DOCUMENTATION CODES:   Obesity unspecified  INTERVENTION:  Continue Nepro Shake po BID, each supplement provides 425 kcal and 19 grams protein.  Continue daily MVI and Rena-vite QHS. Once patient no longer requiring HD can discontinue Rena-vite.  NUTRITION DIAGNOSIS:   Inadequate oral intake related to acute illness as evidenced by other (comment)(per chart review ).  Resolving.  GOAL:   Patient will meet greater than or equal to 90% of their needs  Progressing.  MONITOR:   PO intake, Supplement acceptance, Labs, Weight trends, I & O's, Skin  REASON FOR ASSESSMENT:   LOS    ASSESSMENT:   58 y.o. black male with hyeprtension, COPD, diabetes mellitus type II who underwent bilateral knee replacement surgeries last week. who was admitted to [facility] on 09/12/2018 for sepsis and ARF  Patient continues to have decreased appetite. PO intake is variable. He ate 50% of breakfast and lunch yesterday but 0% of dinner last night. He ate 30% of breakfast this morning. He is drinking 1-2 bottles of Nepro daily. Patient is being monitored daily for dialysis need. Last HD was on 6/12.  Medications reviewed and include: folic acid 1 mg daily, Novolog 0-9 units TID, Novolog 0-5 units QHS, Rena-vite QHS, MVI daily, Lovaza 1 gram daily, warfarin, ceftazidime, daptomycin.  Labs reviewed: CBG 86-122, BUN 37, Creatinine 8.38.  I/O: 3550 mL UOP yesterday (0.9 mL/kg/hr)  Weight trend: 156 kg on 6/12; +4.4 kg from 6/5  Diet Order:   Diet Order            Diet Carb Modified Fluid consistency: Thin; Room service appropriate? Yes  Diet effective now             EDUCATION NEEDS:   Not appropriate for education at this time  Skin:  Skin Assessment: Skin Integrity Issues:(incisions to bilateral knees)  Last BM:  10/02/2018 per chart  Height:   Ht Readings from Last 1 Encounters:  09/12/18 6\' 4"  (1.93 m)   Weight:   Wt Readings from Last 1  Encounters:  09/29/18 (!) 156 kg   Ideal Body Weight:  91.8 kg  BMI:  Body mass index is 41.86 kg/m.  Estimated Nutritional Needs:   Kcal:  2700-3000kcal/day   Protein:  >150g/day   Fluid:  >2.7L/day   Willey Blade, MS, RD, LDN Office: [phone] Pager: [phone] After Hours/Weekend Pager: [phone]

## 2018-10-03 NOTE — TOC Progression Note (Signed)
Transition of Care [facility]) - Progression Note    Patient Details  Name: Isaac Cross MRN: [id number] Date of Birth: March 31, 1961  Transition of Care [facility]) CM/SW [place], RN Phone Number: 10/03/2018, 3:49 PM  Clinical Narrative:    Sent a Message to Dr Luana Shu and asked if the Dapto would be able to be switched to a different medication being as SNF can't accept the patient on the medication due top cost, awaiting a response from the ID physician    Expected Discharge Plan: [place] Barriers to Discharge: Waiting for outpatient dialysis, Other (comment)(acute renal currently)  Expected Discharge Plan and Services Expected Discharge Plan: [place] In-house Referral: Clinical Social Work Discharge Planning Services: CM Consult Post Acute Care Choice: [place] arrangements for the past 2 months: [place]: (Patient stated that he is open to home health. ) Date [place]: 09/18/18 Time [place]: [place] Representative spoke with at [place]: [name]- unsure of when patient will discharge.   Social Determinants of Health (SDOH) Interventions    Readmission Risk Interventions No flowsheet data found.

## 2018-10-03 NOTE — Progress Notes (Signed)
Physical Therapy Treatment Patient Details Name: Isaac Cross MRN: [id number] DOB: [birth year] Today's Date: 10/03/2018    History of Present Illness Pt is a 58 y.o. male presenting to hospital 09/12/18 after rolling OOB landing on L knee; pt with increased dizziness x2 days, fevers x3 days, and generalized weakness.  S/p B TKR 09/01/18 in [place] (pt reports discharging home same day).  Pt admitted with sepsis, acute renal failure, and B TKR.  Pt s/p L knee 5/31 and R knee 6/1 I&D with poly exchange.  Pt noted with severe encephalopathy from DT's with extensive ETOH abuse.  L LE DVT noted.  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  HD started 6/5 with L IJ temporary HD catheter.  PMH includes B TKR 09/01/18 in [place], DM, asthma, R vein surgery, COPD.    PT Comments    Pt in bed awake up entry. Pt continues to demonstrate flat affect, minimal eye contact, delayed response time, delayed initation, delayed problem solving. This date, pt even shows inability to use the room phone and his personal mobile phone in a sensical manner. Pt confused about his catheterization status, having previously self-DC his condom cath, but confabulating that the RN got rid of 'days ago.' Pt moves to EOB with modA and elevated HOB, heavy effort. 3 STS transfers from EOB 30-inch height as in prir sessions. Pt becomes dizzy and weak while seated and is returned to supine, subsequently noted to be orthostatic, but at that time symptoms had gone away. Strength remains consistent, but heavily impaired. Cognition is clearly worse this date than 2 or 3 DA. Author will return to perform cognitive screening later in day. Wife provides baseline info over phone after session, reports pt have no cognitive deficits at baseline and is concerned that his current cognitive deficits are not being taken seriously.     Follow Up Recommendations  CIR     Equipment Recommendations  3in1 (PT);Other (comment)    Recommendations for Other  Services       Precautions / Restrictions Precautions Precautions: Fall Precaution Comments: Pt has been cognitively impaired with poor awareness of this. Restrictions Other Position/Activity Restrictions: WBAT B LE's    Mobility  Bed Mobility Overal bed mobility: Needs Assistance Bed Mobility: Supine to Sit;Sit to Supine     Supine to sit: HOB elevated;Min assist Sit to supine: Total assist;+2 for physical assistance;+2 for safety/equipment(moved to supien quickly as pt became dizzy and weak)      Transfers Overall transfer level: Needs assistance Equipment used: Rolling walker (2 wheeled) Transfers: Sit to/from Stand Sit to Stand: Min guard         General transfer comment: 3x from EOB, then pt slumps to left onto elbow, reports to feel weak and need to lay down  Ambulation/Gait Ambulation/Gait assistance: (pt orthostatic this date, unable to attempt)               Stairs             Wheelchair Mobility    Modified Rankin (Stroke Patients Only)       Balance                                            Cognition Arousal/Alertness: Awake/alert Behavior During Therapy: Flat affect(confused with delayed response/inititation. Pt has poor awareness of limitations at this time.)  Exercises Total Joint Exercises Heel Slides: AAROM;Strengthening;Both;Supine;Limitations;10 reps Heel Slides Limitations: requires mod-maxA 2/2 weakness; Left knee tighter and painful this date    General Comments        Pertinent Vitals/Pain Pain Assessment: Faces Faces Pain Scale: Hurts little more Pain Location: Left knee Pain Descriptors / Indicators: Sore;Discomfort;Grimacing Pain Intervention(s): Limited activity within patient's tolerance;Monitored during session;Repositioned    Home Living                      Prior Function            PT Goals (current goals can now  be found in the care plan section) Acute Rehab PT Goals Patient Stated Goal: to go home PT Goal Formulation: With patient Time For Goal Achievement: 10/13/18 Potential to Achieve Goals: Fair Additional Goals Additional Goal #1: Assess OOB mobility and update goals/POC as appropriate once R femoral arterial line is removed and pt medically appropriate for OOB mobility Progress towards PT goals: Not progressing toward goals - comment(orthostatic and presyncopal last two PT sessiosn)    Frequency    7X/week      PT Plan Current plan remains appropriate    Co-evaluation              AM-PAC PT "6 Clicks" Mobility   Outcome Measure  Help needed turning from your back to your side while in a flat bed without using bedrails?: A Little Help needed moving from lying on your back to sitting on the side of a flat bed without using bedrails?: A Little Help needed moving to and from a bed to a chair (including a wheelchair)?: A Little Help needed standing up from a chair using your arms (e.g., wheelchair or bedside chair)?: A Little Help needed to walk in hospital room?: A Lot Help needed climbing 3-5 steps with a railing? : Total 6 Click Score: 15    End of Session Equipment Utilized During Treatment: Gait belt Activity Tolerance: Other (comment)(orthostatic and dizzy after 3 transfers) Patient left: in bed;with call bell/phone within reach;with bed alarm set Nurse Communication: Mobility status PT Visit Diagnosis: Other abnormalities of gait and mobility (R26.89);Muscle weakness (generalized) (M62.81);Difficulty in walking, not elsewhere classified (R26.2);Pain     Time: 2111-5520 PT Time Calculation (min) (ACUTE ONLY): 50 min  Charges:  $Therapeutic Exercise: 23-37 mins $Therapeutic Activity: 8-22 mins                     2:08 PM, 10/03/18 Etta Grandchild, PT, DPT Physical Therapist - [facility]  [phone] ([place])      , C 10/03/2018, 2:02 PM

## 2018-10-03 NOTE — Progress Notes (Signed)
[place] at [place] NAME: Isaac Cross    MR#:  [id number]  DATE OF BIRTH:  [birth year]  SUBJECTIVE:   The patient has no complaints except generalized weakness.  He has good urine output. REVIEW OF SYSTEMS:  Review of Systems  Constitutional: Positive for malaise/fatigue. Negative for chills, fever and weight loss.  HENT: Negative for ear discharge, ear pain and nosebleeds.   Eyes: Negative for blurred vision, pain and discharge.  Respiratory: Negative for sputum production, shortness of breath, wheezing and stridor.   Cardiovascular: Negative for chest pain, palpitations, orthopnea and PND.  Gastrointestinal: Negative for abdominal pain, diarrhea, nausea and vomiting.  Genitourinary: Negative for frequency and urgency.  Musculoskeletal: Negative for back pain and joint pain.  Neurological: Negative for sensory change, speech change, focal weakness and weakness.  Psychiatric/Behavioral: Negative for depression and hallucinations. The patient is not nervous/anxious.    DRUG ALLERGIES:   Allergies  Allergen Reactions  . 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 Lipitor [Atorvastatin] Hives and Itching  . Other   . Viagra  [Sildenafil Citrate]     vision loss    VITALS:  Blood pressure (!) 157/85, pulse 93, temperature 99.9 F (37.7 C), resp. rate 18, height 6\' 4"  (1.93 m), weight (!) 156 kg, SpO2 100 %.  PHYSICAL EXAMINATION:  Physical Exam   GENERAL:  58 y.o.-year-old patient lying in the bed with no acute distress. Critically ill appearing EYES: Pupils equal, round, reactive to light and accommodation. No scleral icterus.  HEENT: Head atraumatic, normocephalic. Oropharynx and nasopharynx clear.  NECK:  Supple, no jugular venous distention. No thyroid enlargement, no tenderness.  LUNGS: Normal breath sounds bilaterally, no wheezing, rales,rhonchi or crepitation. No use of accessory muscles of  respiration.  Decreased bibasilar breath sounds CARDIOVASCULAR: S1, S2 normal. No murmurs, rubs, or gallops. ABDOMEN: Soft, nontender, nondistended. Bowel sounds present. No organomegaly or mass. EXTREMITIES: Swelling of both legs, knees noted with recent surgery.  No  cyanosis, or clubbing.  Both hands are swollen as well. NEUROLOGIC: Cranial nerves II through XII are intact. Moving all extremities well PSYCHIATRIC: patient is alert and oriented x3 SKIN: No obvious rash, lesion, or ulcer.    LABORATORY PANEL:   CBC Recent Labs  Lab 10/03/18 0302  WBC 10.4  HGB 8.8*  HCT 27.8*  PLT 494*   ------------------------------------------------------------------------------------------------------------------  Chemistries  Recent Labs  Lab 09/29/18 1642  10/02/18 0506 10/03/18 0302  NA  --    < > 137 138  K  --    < > 3.4* 3.7  CL  --    < > 99 101  CO2  --    < > 24 24  GLUCOSE  --    < > 106* 101*  BUN  --    < > 36* 37*  CREATININE  --    < > 8.63* 8.38*  CALCIUM  --    < > 8.3* 8.5*  MG  --    < > 1.9  --   AST 38  --   --   --   ALT 22  --   --   --   ALKPHOS 58  --   --   --   BILITOT 0.7  --   --   --    < > = values in this interval not displayed.   ------------------------------------------------------------------------------------------------------------------  Cardiac Enzymes No results for input(s):  TROPONINI in the last 168 hours. ------------------------------------------------------------------------------------------------------------------  RADIOLOGY:  No results found.  EKG:   Orders placed or performed during the hospital encounter of 09/12/18  . EKG 12-Lead  . EKG 12-Lead  . EKG 12-Lead  . EKG 12-Lead  . EKG 12-Lead  . EKG 12-Lead    ASSESSMENT AND PLAN:    EdwardGradyis a58 y.o.malewith a known history of arthritis, COPD not on home oxygen, hypertension, sleep apnea, non-insulin-dependent diabetes mellitus presents to hospital  secondary to dizziness, weakness and a fall  1.Sepsis -s/p Recent bilateral knee replacement surgeries as outpatient and now bilateral knee washouts done this adm. - fluid Cultures are negative so far. - Fungal cultures negative -Appreciate ID consult.   -Patient was on zosyn, doxycycline --now on ceftazidime and daptomycin (last dose 10/31/2018) -completed 7 days of anidulafungin    2. Acute non- Oliguric renal failure-likely ATN from sepsis and hypotension - appreciate nephrology input. -Hemodialysis started 09/22/2018 -Hold lisinopril and metformin and other nephrotoxins.    The patient has good urine output, creatinine down to 8.36.  3. Acute encephalopathy-secondary to metabolic causes, sepsis and uremia. -No focal deficits Improved.  4. Left popliteal vein DVT - was on heparin drip--platelets dropping--IV argatroban--plt count normal--added po coumadin--needs 5 days of bridiging--pharmacy to dose. -HIT antibody 1.89 -appreciate hematology input with dr Janese Banks -INR 2.9  5. Diabetes mellitus-2 Continue sliding scale, hold Lantus.  6. DVT prophylaxis- argatroban drip and coumadin  Anemia of chronic disease.  Stable.  Hypokalemia.  Potassium supplement. Hypomagnesemia.  Improved with IV magnesium.  I discussed with Dr. Juleen China. I discussed with his wife.  Generalized weakness.  PT evaluation suggest CIR. CODE STATUS: Full code  TOTAL TIME TAKING CARE OF THIS PATIENT: 26 minutes.   POSSIBLE D/C IN 2-3 DAYS, DEPENDING ON CLINICAL CONDITION.   Demetrios Loll M.D on 10/03/2018 at 12:13 PM  Between 7am to 6pm - Pager - [phone] After 6pm go to [email or web address] - password [email or web address] [facility]  Office  [phone]  CC: Primary care physician; Steele Sizer, MD

## 2018-10-03 NOTE — Consult Note (Signed)
[facility] Consult   Reason for Consult: Cognitive impairment, mocha score 7 out of 30 Per physical therapy Referring Physician: Dr. 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 Total Time spent with patient: 1 hour  Subjective: "I do not know when I am going to be ready for discharge yet."  HPI:   Isaac Cross is a 58 y.o. male patient   admitted on 09/12/2018 with a known history of arthritis, COPD not on home oxygen, hypertension, sleep apnea, non-insulin-dependent diabetes mellitus presents to hospital secondary to dizziness, weakness and a fall this morning. Patient had bilateral knee replacement surgeries done about 9 days ago.  He did well for the first week.  Over the last 3 to 4 days he has been having fevers and chills.  Initially had increased frequency of urination that has decreased the last 3 days.  His urine output has been decreased.  He has been feeling dizzy and lightheaded.  Has been nauseous and had one episode of vomiting.  Denies any abdominal pain or diarrhea.  Denies any exposure to sick contacts.  His COVID-19 test is negative here.  Denies any cough or respiratory symptoms or chest pain.  He states that he fell out of bed onto his knees this morning and hurt both his knees.  After the surgery he has been getting home health physical therapy and was able to walk using his walker. In the ED his creatinine is increased from 1 at baseline to 3.7 today.  He received 4.5 L of fluids in the emergency room and his urine output after a Foley catheter was placed here was only 75 cc.  He is hyponatremic, acidotic.  Urine analysis  showing possible infection.  Has lactic acidosis with elevated WBC and procalcitonin.  He is being admitted for sepsis.  Psychiatry consultation is requested for fluctuations in cognition. Record review does indicate that patient has had delirium while hospitalized.  Past Psychiatric History: Anxiety and depression; likely adjustment disorder due to bilateral knee replacement surgeries followed by acute sepsis; delirium per medical records.  On evaluation this afternoon, patient is calm and cooperative.  He is alert and oriented x4.  He is situationally aware of hospitalization, personal events/illness and world events.  Patient is frustrated with the length of his stay in the hospital, and his not being able to see his family.  He indicates he does not know when he will be able to discharge, but knows he has been in the hospital since May 26.  Patient describes his depression and anxiety are under control.  He is denying any suicidal ideation.  He is denying any homicidal ideation.  Patient denies any past history of psychosis or mania.  Risk to Self:  None Risk to Others:  None Prior Inpatient Therapy:  None Prior Outpatient Therapy:  None  Past Medical History:  Past Medical History:  Diagnosis Date  . Allergy   . Anxiety   . Arthritis    knees  . Asthma, mild intermittent, well-controlled   . Chronic obstructive asthma ([place])   . COPD (chronic obstructive pulmonary disease) ([place])   . Depression with anxiety   .  Diabetes ([place])   . GERD (gastroesophageal reflux disease)   . HBP (high blood pressure)   . High cholesterol   . Renal cyst, right   . Sleep apnea    CPAP  . Swelling     Past Surgical History:  Procedure Laterality Date  . BLADDER REPAIR    . COLONOSCOPY WITH PROPOFOL N/A 04/07/2015   Procedure: COLONOSCOPY WITH PROPOFOL;  Surgeon: Lucilla Lame, MD;  Location: [facility];  Service: Endoscopy;  Laterality: N/A;  Diabetic - oral meds CPAP  . HAND SURGERY      4TH AND 5TH FINGER  . I&D KNEE WITH POLY EXCHANGE Right 09/18/2018   Procedure: IRRIGATION AND DEBRIDEMENT KNEE WITH POLY EXCHANGE;  Surgeon: Lovell Sheehan, MD;  Location: [facility];  Service: Orthopedics;  Laterality: Right;  . KNEE ARTHROSCOPY Right   . 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 TOTAL KNEE ARTHROPLASTY Left 09/17/2018   Procedure: I &D left knee and polyethylene exchange;  Surgeon: Lovell Sheehan, MD;  Location: [facility];  Service: Orthopedics;  Laterality: Left;  Marland Kitchen VEIN SURGERY Right    Family History:  Family History  Problem Relation Age of Onset  . Hypertension Mother   . Diabetes Mother   . Diabetes Father   . Hypertension Father   . Cancer Father   . Seizures Daughter   . Lupus Daughter   . Cancer Maternal Grandfather        Prostate  . Diabetes Paternal Grandfather   . Prostate cancer Maternal Uncle   . Bladder Cancer Neg Hx   . Kidney cancer Neg Hx    Family Psychiatric  History: Denies  Social History:  Social History   Substance and Sexual Activity  Alcohol Use Yes  . Alcohol/week: 10.0 standard drinks  . Types: 10 Cans of beer per week   Comment: occasional     Social History   Substance and Sexual Activity  Drug Use No    Social History   Socioeconomic History  . Marital status: Married    Spouse name: Not on file  . Number of children: 2  . Years of education: Not on file  . Highest education level: Bachelor's degree (e.g., BA, AB, BS)  Occupational History  . Occupation: case Freight forwarder   Social Needs  . Financial resource strain: Not hard at all  . Food insecurity    Worry: Never true    Inability: Never true  . Transportation needs    Medical: No    Non-medical: No  Tobacco Use  . Smoking status: Former Smoker    Packs/day: 1.00    Years: 10.00    Pack years: 10.00    Types: Cigarettes    Start date: 04/20/1995    Quit date: 04/19/2005    Years since quitting: 13.4  . Smokeless tobacco: Never Used   Substance and Sexual Activity  . Alcohol use: Yes    Alcohol/week: 10.0 standard drinks    Types: 10 Cans of beer per week    Comment: occasional  . Drug use: No  . Sexual activity: Yes    Partners: Female  Lifestyle  . Physical activity    Days per week: 0 days    Minutes per session: 0 min  . Stress: Not at all  Relationships  . Social connections    Talks on phone: More than three times a week    Gets together: Once a week    Attends religious service: Never  Active member of club or organization: No    Attends meetings of clubs or organizations: Never    Relationship status: Married  Other Topics Concern  . Not on file  Social History Narrative   Lives at home with wife, using a walker since his surgery recently.   Additional Social History:  Patient is married to his current wife, [name] of 84 years.  He has 2 children ages 33 and 66 from his previous wife.  Patient is a Pharmacist, hospital at [employer].    Allergies:   Allergies  Allergen Reactions  . 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 Lipitor [Atorvastatin] Hives and Itching  . Other   . Viagra  [Sildenafil Citrate]     vision loss    Labs:  Results for orders placed or performed during the hospital encounter of 09/12/18 (from the past 48 hour(s))  Glucose, capillary     Status: Abnormal   Collection Time: 10/01/18  9:32 PM  Result Value Ref Range   Glucose-Capillary 133 (H) 70 - 99 mg/dL  Protime-INR     Status: Abnormal   Collection Time: 10/02/18  5:06 AM  Result Value Ref Range   Prothrombin Time 29.3 (H) 11.4 - 15.2 seconds   INR 2.8 (H) 0.8 - 1.2    Comment: (NOTE) INR goal varies based on device and disease states. Performed at [facility], [address] [place], [place] [address]   CK     Status: None   Collection Time: 10/02/18  5:06 AM  Result Value Ref Range   Total CK 114 49 - 397 U/L    Comment: Performed at [facility], [address] [place], [place] [address]  CBC     Status: Abnormal   Collection Time: 10/02/18  5:06 AM  Result Value Ref Range   WBC 10.3 4.0 - 10.5 K/uL   RBC 3.32 (L) 4.22 - 5.81 MIL/uL   Hemoglobin 8.7 (L) 13.0 - 17.0 g/dL   HCT 27.1 (L) 39.0 - 52.0 %   MCV 81.6 80.0 - 100.0 fL   MCH 26.2 26.0 - 34.0 pg   MCHC 32.1 30.0 - 36.0 g/dL   RDW 15.3 11.5 - 15.5 %   Platelets 503 (H) 150 - 400 K/uL   nRBC 0.0 0.0 - 0.2 %    Comment: Performed at [facility], [address] [place], [place] [address]  Basic metabolic panel     Status: Abnormal   Collection Time: 10/02/18  5:06 AM  Result Value Ref Range   Sodium 137 135 - 145 mmol/L   Potassium 3.4 (L) 3.5 - 5.1 mmol/L   Chloride 99 98 - 111 mmol/L   CO2 24 22 - 32 mmol/L   Glucose, Bld 106 (H) 70 - 99 mg/dL   BUN 36 (H) 6 - 20 mg/dL   Creatinine, Ser 8.63 (H) 0.61 - 1.24 mg/dL   Calcium 8.3 (L) 8.9 - 10.3 mg/dL   GFR calc non Af Amer 6 (L) >60 mL/min   GFR calc Af Amer 7 (L) >60 mL/min   Anion gap 14 5 - 15    Comment: Performed at [facility], [address] [place], [place] [address]  Magnesium     Status: None   Collection Time: 10/02/18  5:06 AM  Result Value Ref Range   Magnesium 1.9 1.7 - 2.4 mg/dL    Comment: Performed at [facility],  [place], [place] [address]  APTT     Status: Abnormal   Collection Time: 10/02/18  5:06 AM  Result Value Ref Range   aPTT 103 (H) 24 - 36 seconds    Comment:        IF BASELINE aPTT IS ELEVATED, SUGGEST PATIENT RISK ASSESSMENT BE USED TO DETERMINE APPROPRIATE ANTICOAGULANT THERAPY. Performed at [facility], [place], [place] [address]   Glucose, capillary     Status: None   Collection Time: 10/02/18  7:30 AM  Result Value Ref Range   Glucose-Capillary 89 70 - 99 mg/dL   Comment 1 Notify RN   APTT     Status: Abnormal   Collection Time: 10/02/18  8:00 AM  Result Value Ref Range   aPTT 85 (H) 24 -  36 seconds    Comment:        IF BASELINE aPTT IS ELEVATED, SUGGEST PATIENT RISK ASSESSMENT BE USED TO DETERMINE APPROPRIATE ANTICOAGULANT THERAPY. Performed at [facility], [address] [place], [place] [address]   APTT     Status: Abnormal   Collection Time: 10/02/18 10:37 AM  Result Value Ref Range   aPTT 80 (H) 24 - 36 seconds    Comment:        IF BASELINE aPTT IS ELEVATED, SUGGEST PATIENT RISK ASSESSMENT BE USED TO DETERMINE APPROPRIATE ANTICOAGULANT THERAPY. Performed at [facility], [address] [place], [place] [address]   Glucose, capillary     Status: None   Collection Time: 10/02/18 11:37 AM  Result Value Ref Range   Glucose-Capillary 82 70 - 99 mg/dL   Comment 1 Notify RN   Glucose, capillary     Status: None   Collection Time: 10/02/18  5:06 PM  Result Value Ref Range   Glucose-Capillary 94 70 - 99 mg/dL  CULTURE, BLOOD (ROUTINE X 2) w Reflex to ID Panel     Status: None (Preliminary result)   Collection Time: 10/02/18  5:26 PM   Specimen: BLOOD  Result Value Ref Range   Specimen Description BLOOD BLOOD RIGHT FOREARM    Special Requests      BOTTLES DRAWN AEROBIC AND ANAEROBIC Blood Culture adequate volume   Culture      NO GROWTH < 12 HOURS Performed at [facility], [address] [place], [place] [address]    Report Status PENDING   Cortisol-am, blood     Status: Abnormal   Collection Time: 10/02/18  5:26 PM  Result Value Ref Range   Cortisol - AM 4.1 (L) 6.7 - 22.6 ug/dL    Comment: Performed at [facility], [place] [address] [place], [place] [address]  CULTURE, BLOOD (ROUTINE X 2) w Reflex to ID Panel     Status: None (Preliminary result)   Collection Time: 10/02/18  5:41 PM   Specimen: BLOOD  Result Value Ref Range   Specimen Description BLOOD BLOOD RIGHT HAND    Special Requests      BOTTLES DRAWN AEROBIC AND ANAEROBIC Blood Culture adequate volume   Culture      NO GROWTH < 12 HOURS Performed at  [facility], [address] [place], [place] [address]    Report Status PENDING   Glucose, capillary     Status: None   Collection Time: 10/02/18 10:32 PM  Result Value Ref Range   Glucose-Capillary 81 70 - 99 mg/dL  Glucose, capillary     Status: None  Collection Time: 10/03/18  2:23 AM  Result Value Ref Range   Glucose-Capillary 90 70 - 99 mg/dL   Comment 1 Notify RN   Protime-INR     Status: Abnormal   Collection Time: 10/03/18  3:02 AM  Result Value Ref Range   Prothrombin Time 30.2 (H) 11.4 - 15.2 seconds   INR 2.9 (H) 0.8 - 1.2    Comment: (NOTE) INR goal varies based on device and disease states. Performed at [facility], [address] [place], [place] [address]   Basic metabolic panel     Status: Abnormal   Collection Time: 10/03/18  3:02 AM  Result Value Ref Range   Sodium 138 135 - 145 mmol/L   Potassium 3.7 3.5 - 5.1 mmol/L   Chloride 101 98 - 111 mmol/L   CO2 24 22 - 32 mmol/L   Glucose, Bld 101 (H) 70 - 99 mg/dL   BUN 37 (H) 6 - 20 mg/dL   Creatinine, Ser 8.38 (H) 0.61 - 1.24 mg/dL   Calcium 8.5 (L) 8.9 - 10.3 mg/dL   GFR calc non Af Amer 6 (L) >60 mL/min   GFR calc Af Amer 7 (L) >60 mL/min   Anion gap 13 5 - 15    Comment: Performed at [facility], [address] [place], [place] [address]  APTT     Status: Abnormal   Collection Time: 10/03/18  3:02 AM  Result Value Ref Range   aPTT 106 (H) 24 - 36 seconds    Comment:        IF BASELINE aPTT IS ELEVATED, SUGGEST PATIENT RISK ASSESSMENT BE USED TO DETERMINE APPROPRIATE ANTICOAGULANT THERAPY. Performed at [facility], [address] [place], [place] [address]   CBC     Status: Abnormal   Collection Time: 10/03/18  3:02 AM  Result Value Ref Range   WBC 10.4 4.0 - 10.5 K/uL   RBC 3.39 (L) 4.22 - 5.81 MIL/uL   Hemoglobin 8.8 (L) 13.0 - 17.0 g/dL   HCT 27.8 (L) 39.0 - 52.0 %   MCV 82.0 80.0 - 100.0 fL   MCH 26.0 26.0 - 34.0 pg   MCHC 31.7 30.0 - 36.0  g/dL   RDW 15.2 11.5 - 15.5 %   Platelets 494 (H) 150 - 400 K/uL   nRBC 0.0 0.0 - 0.2 %    Comment: Performed at [facility], [address] [place], [place] [address]  Glucose, capillary     Status: None   Collection Time: 10/03/18  8:02 AM  Result Value Ref Range   Glucose-Capillary 86 70 - 99 mg/dL  APTT     Status: Abnormal   Collection Time: 10/03/18  8:27 AM  Result Value Ref Range   aPTT 93 (H) 24 - 36 seconds    Comment:        IF BASELINE aPTT IS ELEVATED, SUGGEST PATIENT RISK ASSESSMENT BE USED TO DETERMINE APPROPRIATE ANTICOAGULANT THERAPY. Performed at [facility], [address] [place], [place] [address]   Glucose, capillary     Status: Abnormal   Collection Time: 10/03/18 12:08 PM  Result Value Ref Range   Glucose-Capillary 122 (H) 70 - 99 mg/dL  Glucose, capillary     Status: None   Collection Time: 10/03/18  5:08 PM  Result Value Ref Range   Glucose-Capillary 90 70 - 99 mg/dL    Current Facility-Administered Medications  Medication Dose Route Frequency Provider Last Rate Last Dose  .  0.9 %  sodium chloride infusion   Intravenous PRN Flora Lipps, MD 5 mL/hr at 09/29/18 0242    . 0.9 %  sodium chloride infusion  250 mL Intravenous PRN Flora Lipps, MD 0 mL/hr at 09/30/18 1534    . acetaminophen (TYLENOL) tablet 650 mg  650 mg Oral Q6H PRN Lovell Sheehan, MD   650 mg at 10/02/18 1713   Or  . acetaminophen (TYLENOL) suppository 650 mg  650 mg Rectal Q6H PRN Lovell Sheehan, MD   650 mg at 09/20/18 0444  . albuterol (PROVENTIL) (2.5 MG/3ML) 0.083% nebulizer solution 2.5 mg  2.5 mg Nebulization Q6H PRN Lovell Sheehan, MD      . alum & mag hydroxide-simeth (MAALOX/MYLANTA) 200-200-20 MG/5ML suspension 30 mL  30 mL Oral Q4H PRN Fritzi Mandes, MD      . anticoagulant sodium citrate solution 5 mL  5 mL Intravenous Q dialysis Lateef, [place], MD   5 mL at 09/29/18 1837  . aspirin EC tablet 81 mg  81 mg Oral Daily Lovell Sheehan, MD   81 mg at  10/03/18 1884  . atenolol (TENORMIN) tablet 25 mg  25 mg Oral Daily Fritzi Mandes, MD   25 mg at 10/03/18 0918  . cefTAZidime (FORTAZ) 1 g in sodium chloride 0.9 % 100 mL IVPB  1 g Intravenous Q24H Ravishankar, Joellyn Quails, MD 200 mL/hr at 10/03/18 1746 1 g at 10/03/18 1746  . Chlorhexidine Gluconate Cloth 2 % PADS 6 each  6 each Topical Q0600 Lovell Sheehan, MD   6 each at 10/02/18 [phone]  . citalopram (CELEXA) tablet 20 mg  20 mg Oral Daily Fritzi Mandes, MD   20 mg at 10/03/18 0918  . DAPTOmycin (CUBICIN) 700 mg in sodium chloride 0.9 % IVPB  700 mg Intravenous Q48H Berton Mount, RPH 228 mL/hr at 10/01/18 1958 700 mg at 10/01/18 1958  . feeding supplement (NEPRO CARB STEADY) liquid 237 mL  237 mL Oral BID BM Flora Lipps, MD 237 mL/hr at 09/25/18 0951 237 mL at 10/03/18 1741  . fentaNYL (SUBLIMAZE) injection 12.5 mcg  12.5 mcg Intravenous Q8H PRN Awilda Bill, NP   12.5 mcg at 09/22/18 1756  . fluticasone furoate-vilanterol (BREO ELLIPTA) 100-25 MCG/INH 1 puff  1 puff Inhalation Daily Fritzi Mandes, MD   1 puff at 10/03/18 0919  . folic acid (FOLVITE) tablet 1 mg  1 mg Oral Daily Fritzi Mandes, MD   1 mg at 10/03/18 6301  . HYDROcodone-acetaminophen (NORCO/VICODIN) 5-325 MG per tablet 1-2 tablet  1-2 tablet Oral Q4H PRN Lovell Sheehan, MD   2 tablet at 09/19/18 0450  . insulin aspart (novoLOG) injection 0-5 Units  0-5 Units Subcutaneous QHS Fritzi Mandes, MD      . insulin aspart (novoLOG) injection 0-9 Units  0-9 Units Subcutaneous TID WC Fritzi Mandes, MD   1 Units at 10/03/18 1248  . iohexol (OMNIPAQUE) 240 MG/ML injection 50 mL  50 mL Oral Once PRN Awilda Bill, NP      . ipratropium-albuterol (DUONEB) 0.5-2.5 (3) MG/3ML nebulizer solution 3 mL  3 mL Nebulization Q6H PRN Flora Lipps, MD   3 mL at 09/25/18 0721  . MEDLINE mouth rinse  15 mL Mouth Rinse BID Flora Lipps, MD   15 mL at 10/03/18 0918  . menthol-cetylpyridinium (CEPACOL) lozenge 3 mg  1 lozenge Oral PRN Lovell Sheehan, MD        Or  . phenol (CHLORASEPTIC) mouth spray 1 spray  1 spray Mouth/Throat PRN Lovell Sheehan, MD      . menthol-cetylpyridinium (CEPACOL) lozenge 3 mg  1 lozenge Oral PRN Lovell Sheehan, MD       Or  . phenol (CHLORASEPTIC) mouth spray 1 spray  1 spray Mouth/Throat PRN Lovell Sheehan, MD      . metoCLOPramide (REGLAN) tablet 5-10 mg  5-10 mg Oral Q8H PRN Lovell Sheehan, MD       Or  . metoCLOPramide (REGLAN) injection 5-10 mg  5-10 mg Intravenous Q8H PRN Lovell Sheehan, MD      . multivitamin (RENA-VIT) tablet 1 tablet  1 tablet Oral Q1200 Fritzi Mandes, MD   1 tablet at 10/03/18 1247  . multivitamin with minerals tablet 1 tablet  1 tablet Oral Daily Fritzi Mandes, MD   1 tablet at 10/03/18 [phone]  . omega-3 acid ethyl esters (LOVAZA) capsule 1 g  1 g Oral Daily Fritzi Mandes, MD   1 g at 10/03/18 0919  . ondansetron (ZOFRAN) tablet 4 mg  4 mg Oral Q6H PRN Lovell Sheehan, MD       Or  . ondansetron [facility]) injection 4 mg  4 mg Intravenous Q6H PRN Lovell Sheehan, MD      . sodium chloride flush (NS) 0.9 % injection 10-40 mL  10-40 mL Intracatheter Q12H Kolluru, Sarath, MD   10 mL at 10/03/18 0919  . sodium chloride flush (NS) 0.9 % injection 10-40 mL  10-40 mL Intracatheter PRN Kolluru, Sarath, MD      . sodium chloride flush (NS) 0.9 % injection 3 mL  3 mL Intravenous Q12H Flora Lipps, MD   3 mL at 10/03/18 0919  . sodium chloride flush (NS) 0.9 % injection 3 mL  3 mL Intravenous PRN Flora Lipps, MD   3 mL at 09/24/18 0805  . Warfarin - Pharmacist Dosing Inpatient   Does not apply [id number] Fritzi Mandes, MD        Musculoskeletal: Strength & Muscle Tone: decreased Gait & Station: unsteady Patient leans: N/A  Psychiatric Specialty Exam: Physical Exam  Nursing note and vitals reviewed. Constitutional: He is oriented to person, place, and time. He appears well-developed and well-nourished. No distress.  HENT:  Head: Normocephalic and atraumatic.  Eyes: EOM are normal.  Neck: Normal range of  motion.  Cardiovascular: Normal rate and regular rhythm.  Respiratory: Effort normal. No respiratory distress.  Musculoskeletal: Normal range of motion.  Neurological: He is alert and oriented to person, place, and time.    Review of Systems  Constitutional: Negative.   Respiratory: Negative.   Cardiovascular: Negative.   Gastrointestinal: Negative.   Musculoskeletal: Negative.   Neurological: Negative.     Blood pressure 127/64, pulse 95, temperature (!) 100.9 F (38.3 C), resp. rate 18, height 6\' 4"  (1.93 m), weight (!) 156 kg, SpO2 98 %.Body mass index is 41.86 kg/m.  General Appearance: Casual and Neat  Eye Contact:  Good  Speech:  Clear and Coherent and Normal Rate  Volume:  Normal  Mood:  Frustrated  Affect:  Appropriate  Thought Process:  Coherent, Linear and Descriptions of Associations: Intact  Orientation:  Full (Time, Place, and Person)  Thought Content:  Logical and Hallucinations: None  Suicidal Thoughts:  No  Homicidal Thoughts:  No  Memory:  Good  Judgement:  Fair  Insight:  Fair  Psychomotor Activity:  Decreased  Concentration:  Concentration: Good and Attention Span: Good  Recall:  [employer] of Knowledge:  Good  Language:  Good  Akathisia:  No  Handed:  Right  AIMS (if indicated):     Assets:  Communication Skills Desire for Improvement Financial Resources/Insurance Housing Intimacy Social Support Talents/Skills Vocational/Educational  ADL's:  Impaired  Cognition:  WNL  Sleep:   Patient has obstructive sleep apnea, uses BiPAP at night     Treatment Plan Summary: Daily contact with patient to assess and evaluate symptoms and progress in treatment and Medication management  Continue patient's Celexa 20 mg daily for anxiety and depression.  Patient likely has ongoing delirium which presents as fluctuating cognition.  At time of this assessment patient has full capacity.  The patient's exam is notable for altered sensorium, perceptual  disturbances, disorientation and cognitive deficits that appear markedly different than their baseline, suggesting a diagnosis of delirium.  Virtually any medical condition or physiologic stress can precipitate delirium in a susceptible individual, with risk increasing in those with: advanced age, sensory impairments, organic brain disease (stroke, dementia, Parkinsons), psychiatric illness, major chronic medical issues, prolonged hospitalizations, postoperative status, anemia, insomnia/disturbed sleep, and severe pain. Addressing the underlying medical condition and institution of preventative measures are recommended.  - Continue to monitor and treat underlying medical causes of delirium, including infection, electrolyte disturbances, etc. - Delirium precautions - Minimize/avoid deliriogenic meds including: anticholinergic, opiates, benzodiazepines           - Maintain hydration, oxygenation, nutrition           - Limit use of restraints and catheters           - Normalize sleep patterns by minimizing nighttime noise, light and interruptions by     -Ensure sleep apnea treatment is provided overnight.             clustering care, opening blinds during the day           - Reorient the patient frequently, provide easily visible clock and calendar           - Provide sensory aids like glasses, hearing aids           - Encourage ambulation, regular activities and visitors to maintain cognitive stimulation   -Patient would benefit from having family members at bedside to reinforce his orientation.  Disposition: No evidence of imminent risk to self or others at present.   Patient does not meet criteria for psychiatric inpatient admission. Supportive therapy provided about ongoing stressors.  Lavella Hammock, MD 10/03/2018 8:12 PM

## 2018-10-03 NOTE — TOC Progression Note (Signed)
Transition of Care [facility]) - Progression Note    Patient Details  Name: Isaac Cross MRN: [id number] Date of Birth: 07/06/1960  Transition of Care [facility]) CM/SW [place], RN Phone Number: 10/03/2018, 9:11 AM  Clinical Narrative:     Spoke to the patient's wife [name].  I explained that to be appropriate for CIR the patient would need to be able to be out of bed for at least an hour and to be able to work with PT.  At this point he is not able to be out of bed for that long of a time.  He does still have the Daptomyacin in pace and that would need to be able to be changed to something else for [facility] or another SNF to accept him..  I let her know we are still monitoring everything and I would let her know if anything changes.  She thanked me for the information and stated understadning  Expected Discharge Plan: [place] Barriers to Discharge: Waiting for outpatient dialysis, Other (comment)(acute renal currently)  Expected Discharge Plan and Services Expected Discharge Plan: [place] In-house Referral: Clinical Social Work Discharge Planning Services: CM Consult Post Acute Care Choice: [place] arrangements for the past 2 months: [place]: (Patient stated that he is open to home health. ) Date [place]: 09/18/18 Time [place]: [place] Representative spoke with at [place]: [name]- unsure of when patient will discharge.   Social Determinants of Health (SDOH) Interventions    Readmission Risk Interventions No flowsheet data found.

## 2018-10-03 NOTE — Progress Notes (Signed)
PT performing Montreal Cognitive Assessment screening tool with patient in the setting of several days with cognitive deficits.  Pt scored 7/30 whereas 26-30/30 is considered a normal score. Scores less than 26 are indicative of mild to moderate cognitive  impairment and warrant more in-depth cognitive assessment from a qualified practitioner. A score of 7/30 is far below the floor affect for scoring limits.   3:06 PM, 10/03/18 Etta Grandchild, PT, DPT Physical Therapist - [facility]  [phone])      10/03/18 1501  PT Visit Information  Last PT Received On 10/03/18  History of Present Illness Pt is a 58 y.o. male presenting to hospital 09/12/18 after rolling OOB landing on L knee; pt with increased dizziness x2 days, fevers x3 days, and generalized weakness.  S/p B TKR 09/01/18 in [place] (pt reports discharging home same day).  Pt admitted with sepsis, acute renal failure, and B TKR.  Pt s/p L knee 5/31 and R knee 6/1 I&D with poly exchange.  Pt noted with severe encephalopathy from DT's with extensive ETOH abuse.  L LE DVT noted.  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  HD started 6/5 with L IJ temporary HD catheter.  PMH includes B TKR 09/01/18 in [place], DM, asthma, R vein surgery, COPD.   Media Information    Document Information  Photos  [facility] 7/30  10/03/2018 15:01  Attached To:  Hospital Encounter on 09/12/18  Source Information  Buccola, Jackquline [place], PT  [facility] (1a)

## 2018-10-04 ENCOUNTER — Inpatient Hospital Stay: Payer: BC Managed Care – PPO

## 2018-10-04 DIAGNOSIS — E279 Disorder of adrenal gland, unspecified: Secondary | ICD-10-CM

## 2018-10-04 LAB — BASIC METABOLIC PANEL
Anion gap: 14 (ref 5–15)
BUN: 37 mg/dL — ABNORMAL HIGH (ref 6–20)
CO2: 22 mmol/L (ref 22–32)
Calcium: 8.6 mg/dL — ABNORMAL LOW (ref 8.9–10.3)
Chloride: 102 mmol/L (ref 98–111)
Creatinine, Ser: 7.51 mg/dL — ABNORMAL HIGH (ref 0.61–1.24)
GFR calc Af Amer: 8 mL/min — ABNORMAL LOW (ref >60)
GFR calc non Af Amer: 7 mL/min — ABNORMAL LOW (ref >60)
Glucose, Bld: 94 mg/dL (ref 70–99)
Potassium: 3.4 mmol/L — ABNORMAL LOW (ref 3.5–5.1)
Sodium: 138 mmol/L (ref 135–145)

## 2018-10-04 LAB — GLUCOSE, CAPILLARY
Glucose-Capillary: 78 mg/dL (ref 70–99)
Glucose-Capillary: 86 mg/dL (ref 70–99)
Glucose-Capillary: 82 mg/dL (ref 70–99)
Glucose-Capillary: 82 mg/dL (ref 70–99)
Glucose-Capillary: 79 mg/dL (ref 70–99)

## 2018-10-04 LAB — CBC WITH DIFFERENTIAL/PLATELET
Abs Immature Granulocytes: 0.04 10*3/uL (ref 0.00–0.07)
Basophils Absolute: 0.1 10*3/uL (ref 0.0–0.1)
Basophils Relative: 1 %
Eosinophils Absolute: 0.3 10*3/uL (ref 0.0–0.5)
Eosinophils Relative: 3 %
HCT: 26.6 % — ABNORMAL LOW (ref 39.0–52.0)
Hemoglobin: 8.2 g/dL — ABNORMAL LOW (ref 13.0–17.0)
Immature Granulocytes: 0 %
Lymphocytes Relative: 23 %
Lymphs Abs: 2.3 10*3/uL (ref 0.7–4.0)
MCH: 26.2 pg (ref 26.0–34.0)
MCHC: 30.8 g/dL (ref 30.0–36.0)
MCV: 85 fL (ref 80.0–100.0)
Monocytes Absolute: 1.7 10*3/uL — ABNORMAL HIGH (ref 0.1–1.0)
Monocytes Relative: 16 %
Neutro Abs: 5.7 10*3/uL (ref 1.7–7.7)
Neutrophils Relative %: 57 %
Platelets: 472 10*3/uL — ABNORMAL HIGH (ref 150–400)
RBC: 3.13 MIL/uL — ABNORMAL LOW (ref 4.22–5.81)
RDW: 16 % — ABNORMAL HIGH (ref 11.5–15.5)
WBC: 10.2 10*3/uL (ref 4.0–10.5)
nRBC: 0 % (ref 0.0–0.2)

## 2018-10-04 LAB — PROTIME-INR
INR: 2 — ABNORMAL HIGH (ref 0.8–1.2)
Prothrombin Time: 22.2 seconds — ABNORMAL HIGH (ref 11.4–15.2)

## 2018-10-04 LAB — CBC
HCT: 25.9 % — ABNORMAL LOW (ref 39.0–52.0)
Hemoglobin: 8.2 g/dL — ABNORMAL LOW (ref 13.0–17.0)
MCH: 26.1 pg (ref 26.0–34.0)
MCHC: 31.7 g/dL (ref 30.0–36.0)
MCV: 82.5 fL (ref 80.0–100.0)
Platelets: 450 10*3/uL — ABNORMAL HIGH (ref 150–400)
RBC: 3.14 MIL/uL — ABNORMAL LOW (ref 4.22–5.81)
RDW: 15.8 % — ABNORMAL HIGH (ref 11.5–15.5)
WBC: 10.4 10*3/uL (ref 4.0–10.5)
nRBC: 0 % (ref 0.0–0.2)

## 2018-10-04 LAB — TSH: TSH: 2.775 u[IU]/mL (ref 0.350–4.500)

## 2018-10-04 LAB — SEDIMENTATION RATE: Sed Rate: 127 mm/hr — ABNORMAL HIGH (ref 0–20)

## 2018-10-04 LAB — CORTISOL-AM, BLOOD: Cortisol - AM: 4.6 ug/dL — ABNORMAL LOW (ref 6.7–22.6)

## 2018-10-04 IMAGING — CT CT HEAD WITHOUT CONTRAST
3 series · 15 of 47 positions shown, 18 images · non-contrast
Comparison: Head CT [DATE]

CLINICAL DATA: Dizziness and weakness.  Fall.

EXAM:
CT HEAD WITHOUT CONTRAST
TECHNIQUE: Contiguous axial images were obtained from the base of the skull
through the vertex without intravenous contrast.

[Series 2: head wo · axial · 0.45mm/px · z∈[-147,-22]mm · 9 of 31 slices shown, 12 images]
[im 3/31  brain]
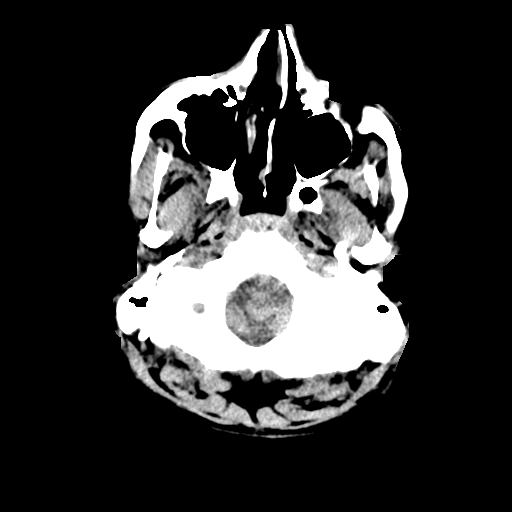
[im 3/31  bone]
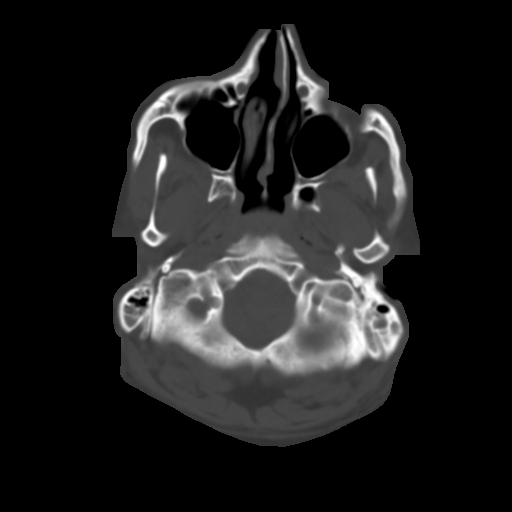
[im 6/31  brain]
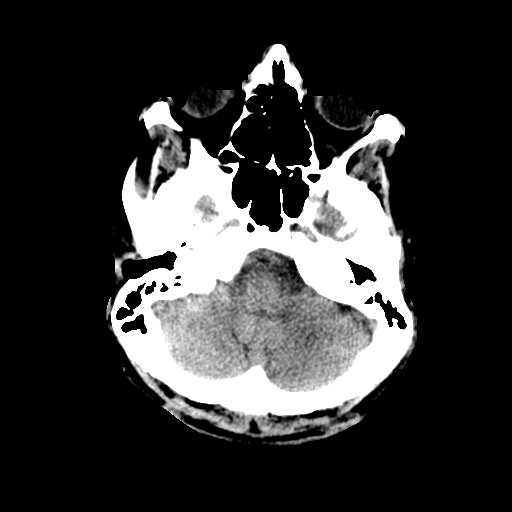
[im 9/31  brain]
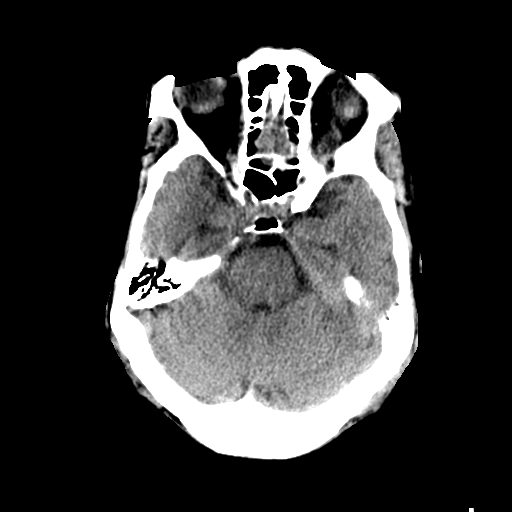
[im 12/31  brain]
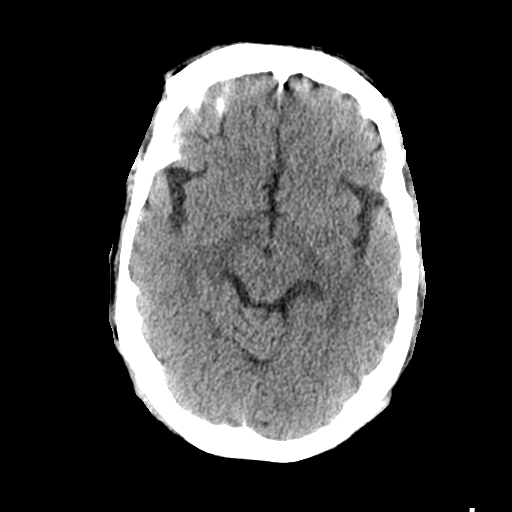
[im 16/31  brain]
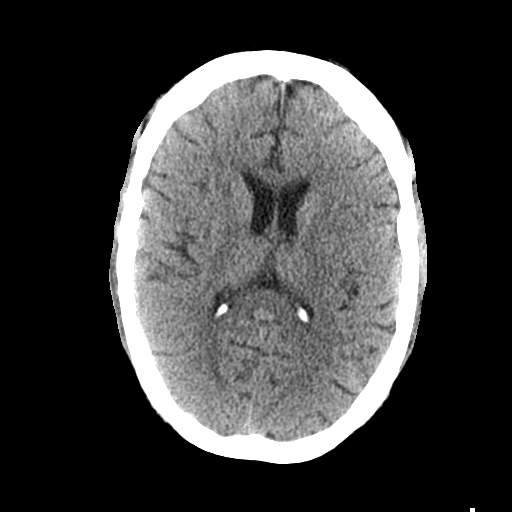
[im 16/31  bone]
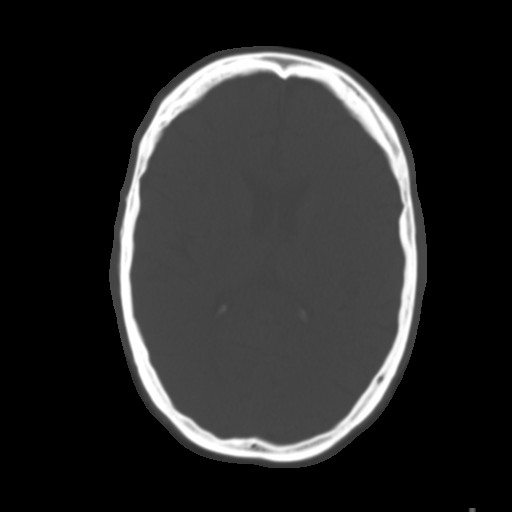
[im 19/31  brain]
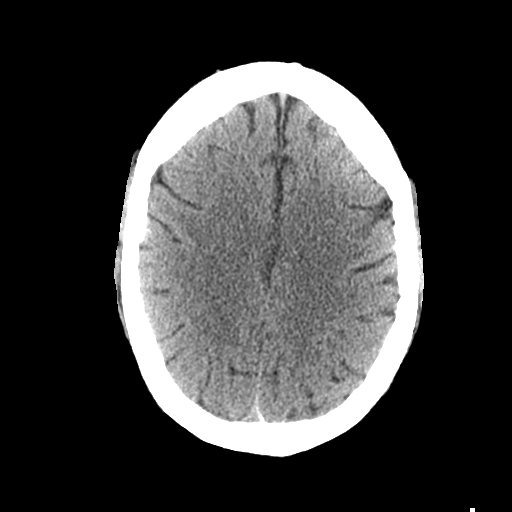
[im 22/31  brain]
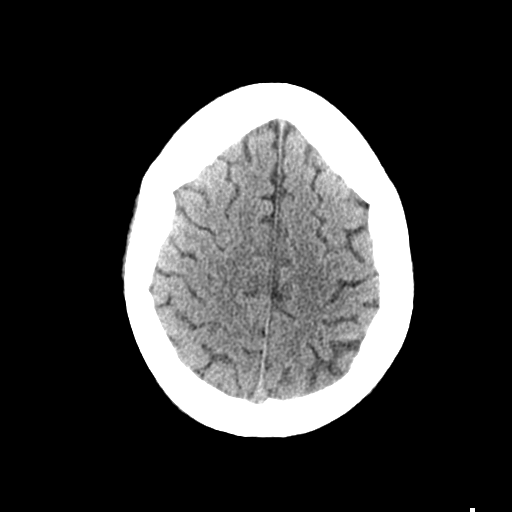
[im 25/31  brain]
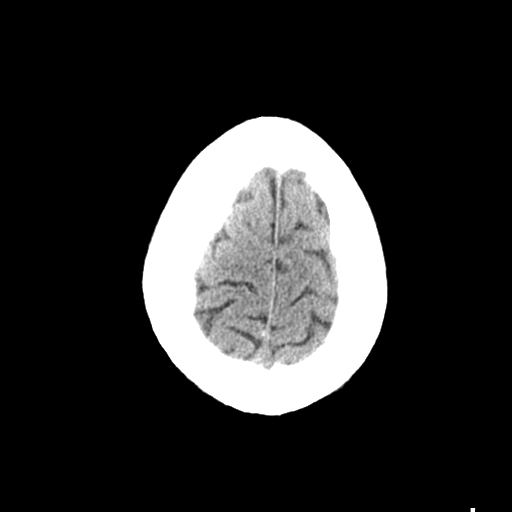
[im 28/31  brain]
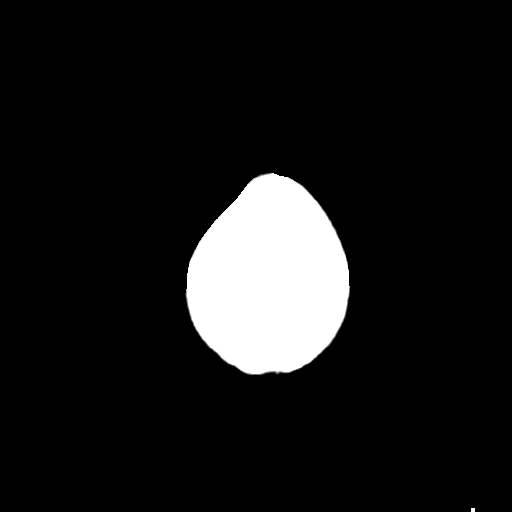
[im 28/31  bone]
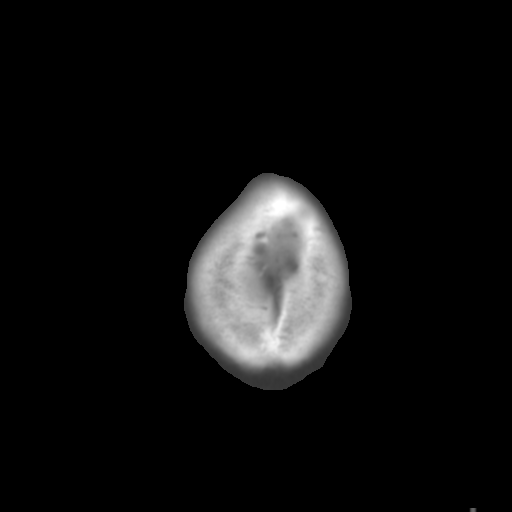

[Series 4: coronal soft tissue · coronal · 0.33mm/px · 3 of 73 slices shown]
[im 25/73  brain]
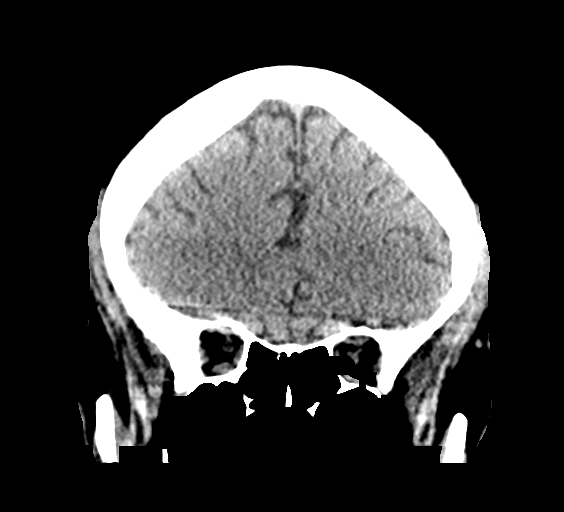
[im 33/73  brain]
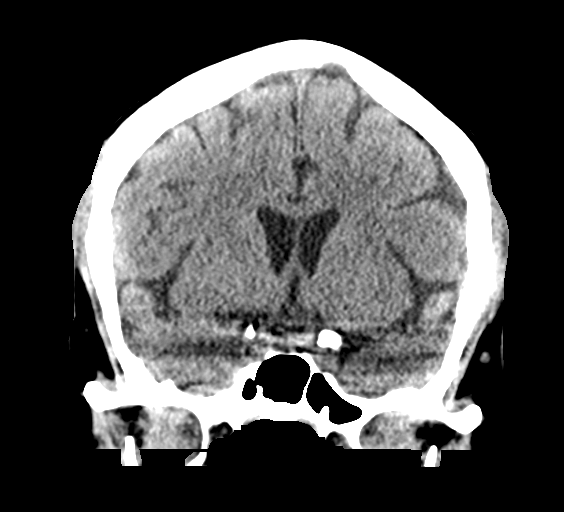
[im 41/73  brain]
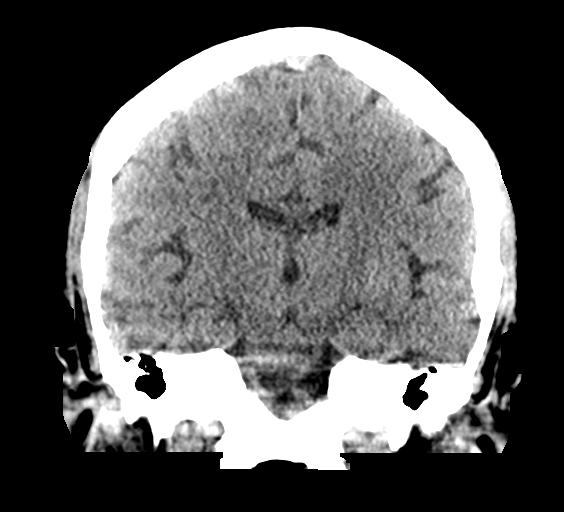

[Series 5: sagittal soft tissue · sagittal · 0.35mm/px · 3 of 53 slices shown]
[im 18/53  brain]
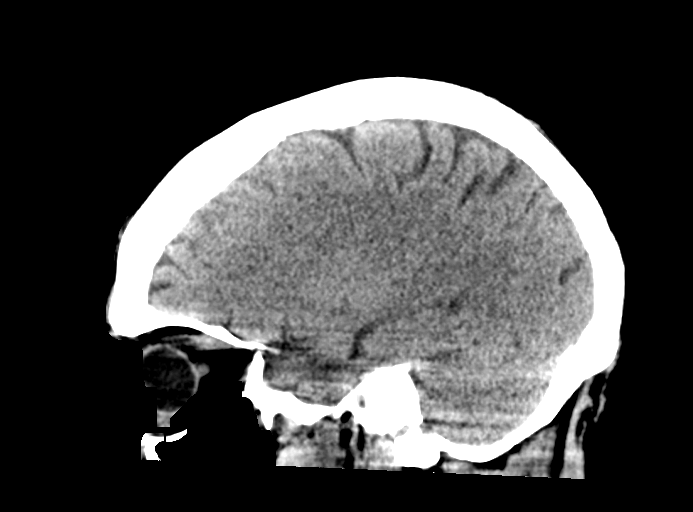
[im 27/53  brain]
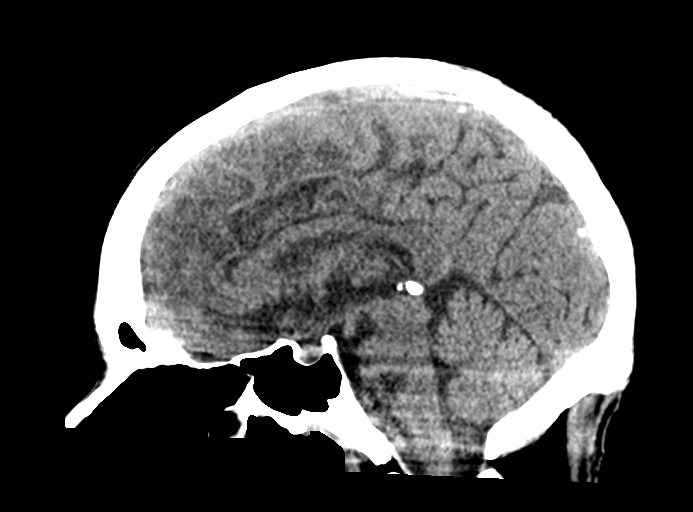
[im 35/53  brain]
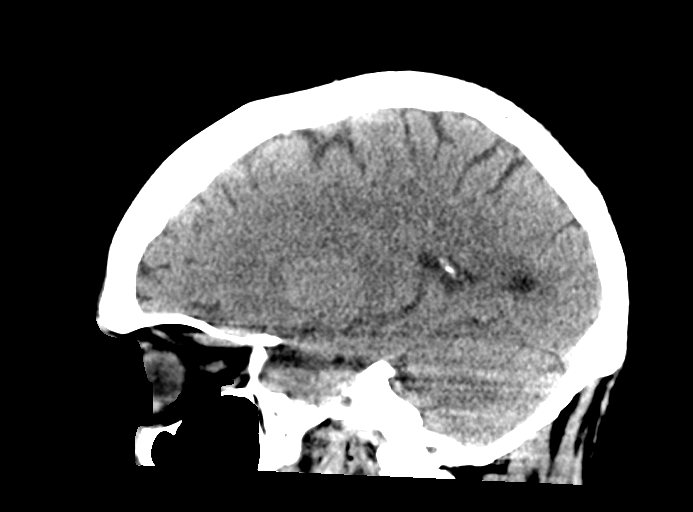

[15 of 47 positions shown; findings below may reference images not displayed]

FINDINGS: Brain: There is no mass, hemorrhage or extra-axial collection. The
size and configuration of the ventricles and extra-axial CSF spaces
are normal. The brain parenchyma is normal, without acute or chronic
infarction.

Vascular: No abnormal hyperdensity of the major intracranial
arteries or dural venous sinuses. No intracranial atherosclerosis.

Skull: The visualized skull base, calvarium and extracranial soft
tissues are normal.

Sinuses/Orbits: Small amount of fluid at the left mastoid tip. No
middle ear effusion. Paranasal sinuses are clear. The orbits are
normal.
IMPRESSION: Normal brain.

## 2018-10-04 IMAGING — CT CT CHEST WITHOUT CONTRAST
2 of 4 series · 12 of 36 positions shown, 15 images · non-contrast
Comparison: Abdomen pelvis CT [DATE]

CLINICAL DATA: Fever of unknown origin.

EXAM:
CT CHEST, ABDOMEN AND PELVIS WITHOUT CONTRAST
TECHNIQUE: Multidetector CT imaging of the chest, abdomen and pelvis was
performed following the standard protocol without IV contrast.

[Series 2: axial st · axial · 0.80mm/px · z∈[-940,-340]mm · 9 of 144 slices shown, 12 images]
[im 12/144  mediastinal]
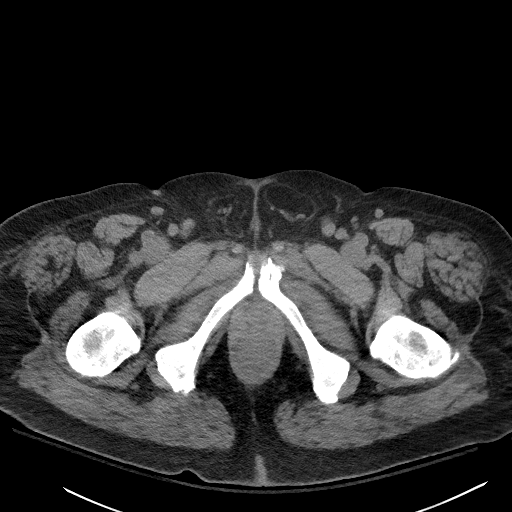
[im 12/144  lung]
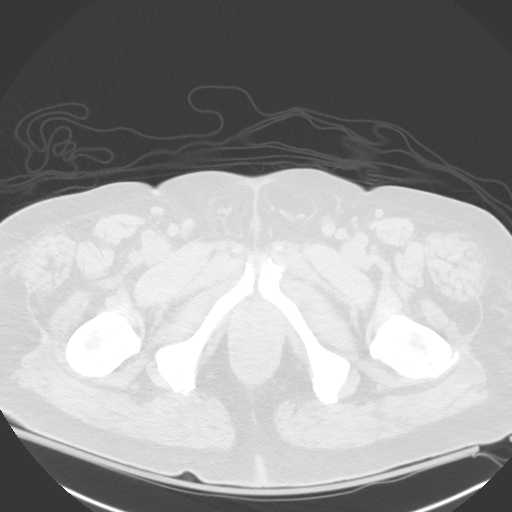
[im 24/144  lung]
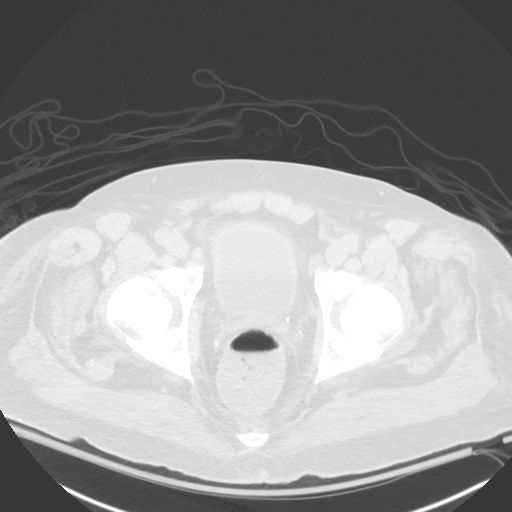
[im 48/144  lung]
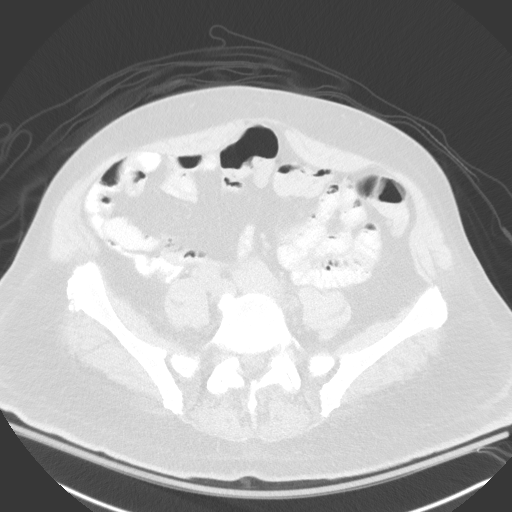
[im 60/144  lung]
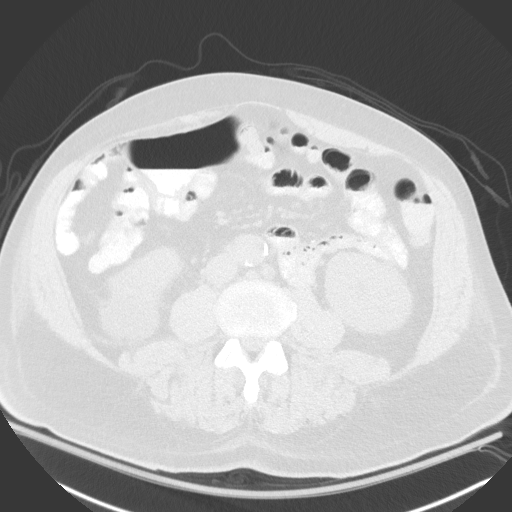
[im 72/144  mediastinal]
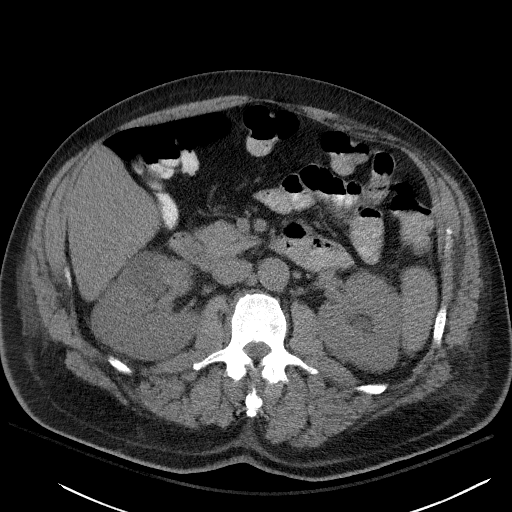
[im 72/144  lung]
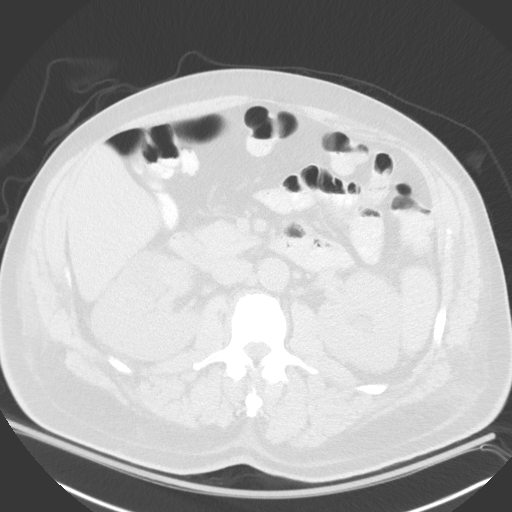
[im 84/144  lung]
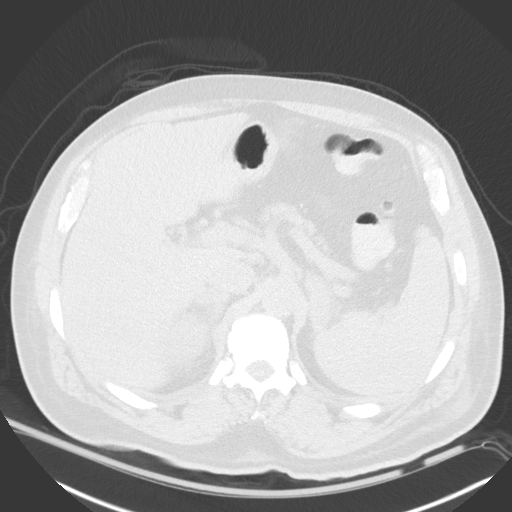
[im 96/144  lung]
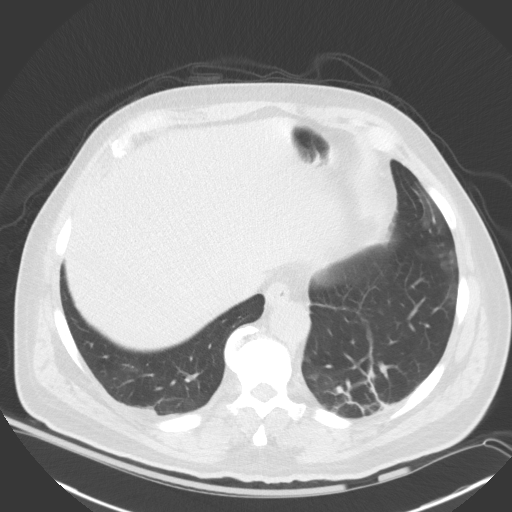
[im 120/144  lung]
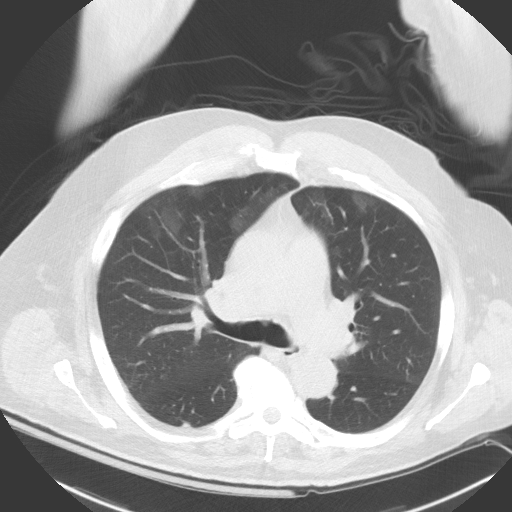
[im 132/144  mediastinal]
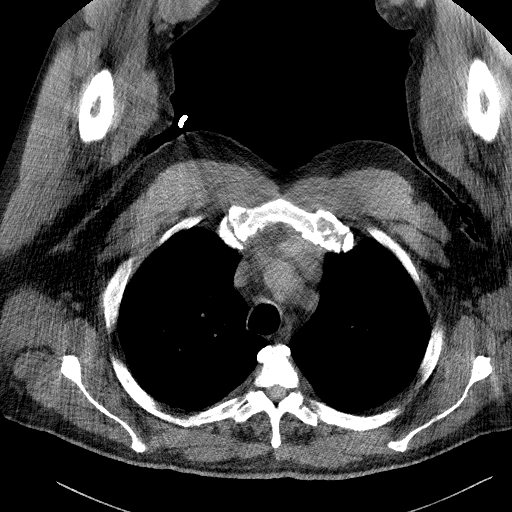
[im 132/144  lung]
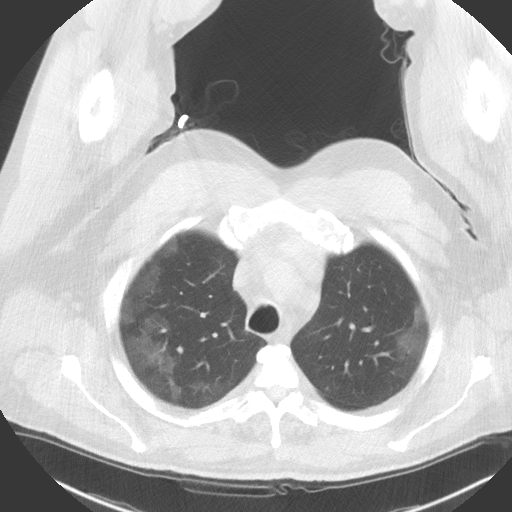

[Series 5: coronal · coronal · 1.02mm/px · 3 of 164 slices shown]
[im 33/164  lung]
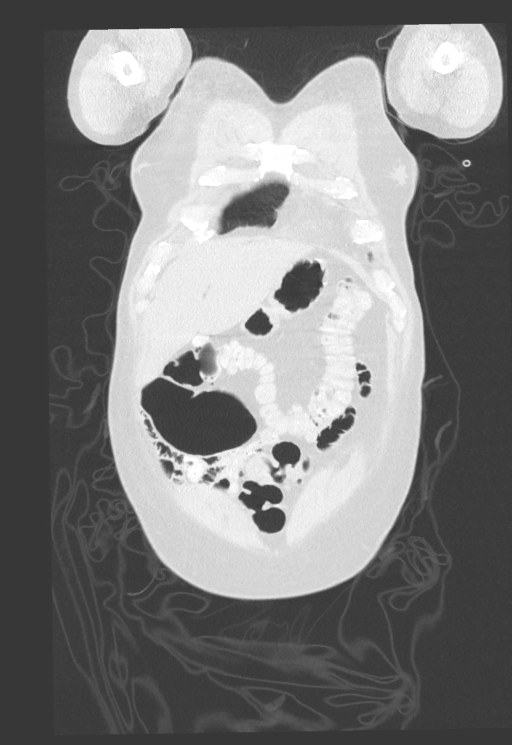
[im 66/164  lung]
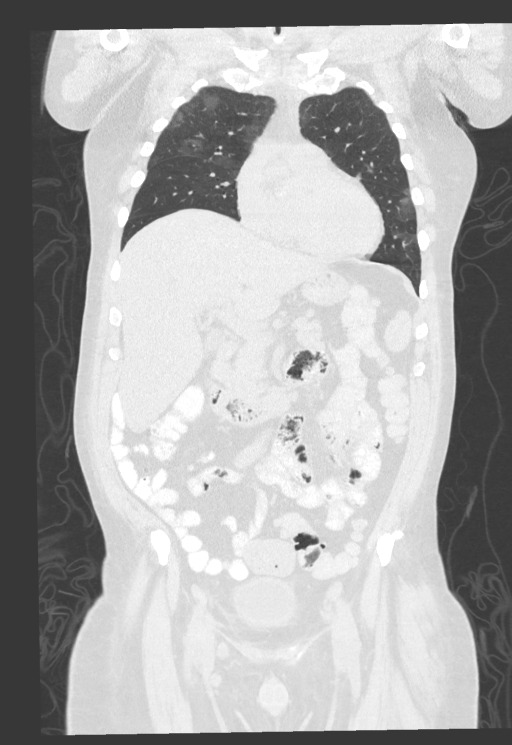
[im 98/164  lung]
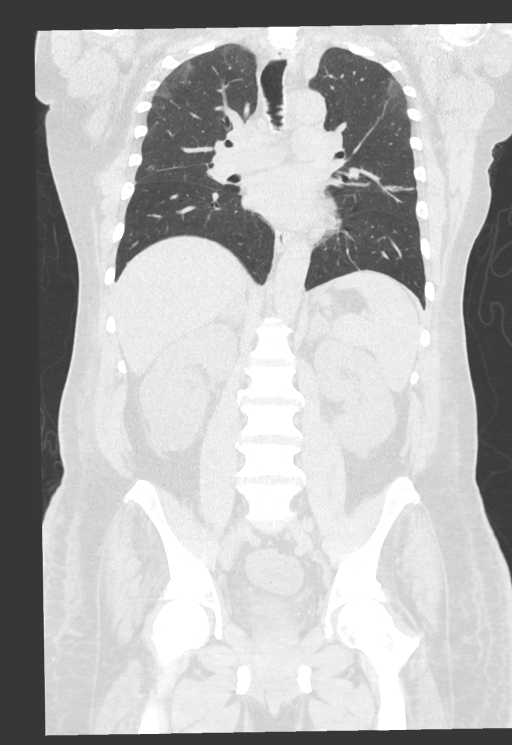

[12 of 36 positions shown; findings below may reference images not displayed]

FINDINGS: CT CHEST FINDINGS

Cardiovascular: The heart size is normal. No substantial pericardial
effusion. Coronary artery calcification is evident. No thoracic
aortic aneurysm.

Mediastinum/Nodes: No mediastinal lymphadenopathy. No evidence for
gross hilar lymphadenopathy although assessment is limited by the
lack of intravenous contrast on today's study. The esophagus has
normal imaging features. There is no axillary lymphadenopathy.

Lungs/Pleura: Patchy areas of ground-glass attenuation are
identified in the lungs bilaterally. Subsegmental atelectasis noted
in the dependent lower lobes bilaterally. No dense focal airspace
consolidation. No pleural effusion.

Musculoskeletal: No worrisome lytic or sclerotic osseous
abnormality.

CT ABDOMEN PELVIS FINDINGS

Hepatobiliary: The liver shows diffusely decreased attenuation
suggesting steatosis. There is no evidence for gallstones,
gallbladder wall thickening, or pericholecystic fluid. No
intrahepatic or extrahepatic biliary dilation.

Pancreas: No focal mass lesion. No dilatation of the main duct. No
intraparenchymal cyst. No peripancreatic edema.

Spleen: No splenomegaly. No focal mass lesion.

Adrenals/Urinary Tract: 5.2 x 2.6 cm left adrenal mass is new since
a chest CT of [DATE]. This mass has attenuation too high to
allow classification as an adenoma. 3.2 cm low-density lesion in the
interpolar right kidney approaches water density and is unchanged in
the interval, likely a cyst. Similar exophytic water density lesion
in the lower pole right kidney measures 5 cm and is likely a cyst.
No evidence for hydroureter. Urinary bladder wall is mildly
thickened and ill-defined. Tiny gas bubble in the bladder lumen
noted. 4.1 x 2.3 cm nodule in the right adrenal gland is also new in
the interval. This has low attenuation suggesting that it represents
an adenoma.

Stomach/Bowel: Stomach is unremarkable. No gastric wall thickening.
No evidence of outlet obstruction. Duodenum is normally positioned
as is the ligament of Treitz. No small bowel wall thickening. No
small bowel dilatation. The terminal ileum is normal. No gross
colonic mass. No colonic wall thickening. Diverticuli are seen
scattered along the entire length of the colon without CT findings
of diverticulitis.

Vascular/Lymphatic: There is abdominal aortic atherosclerosis
without aneurysm. There is no gastrohepatic or hepatoduodenal
ligament lymphadenopathy. No intraperitoneal or retroperitoneal
lymphadenopathy. Upper normal to borderline enlarged lymph nodes are
seen in the external iliac pelvic sidewall bilaterally. Upper normal
groin nodes are evident bilaterally.

Reproductive: The prostate gland and seminal vesicles are
unremarkable.

Other: No intraperitoneal free fluid.

Musculoskeletal: No worrisome lytic or sclerotic osseous
abnormality. Small bilateral groin hernias contain only fat.
IMPRESSION: 1. Mild circumferential bladder wall thickening with ill-defined
bladder wall thickening. Infection/inflammation would be a
consideration. Tiny gas bubble in the bladder lumen is presumably
related to recent instrumentation although bladder infection could
have this appearance.
2. New 4.1 x 2.3 cm right adrenal nodule with low attenuation most
suggestive of benign adrenal adenoma. 5.2 x 2.6 cm left adrenal also
mass new since [DATE]. This has attenuation too high to allow
classification as an adenoma on this noncontrast CT. Follow-up CT
could be used to ensure stability or MRI of the abdomen without and
with contrast could be used to further evaluate for lipid poor
adenoma.
3. Patchy areas of ground-glass attenuation in both lungs. Imaging
features are nonspecific and may be related to an infectious or
inflammatory alveolitis.
4. Upper normal to borderline enlarged lymph nodes in the external
iliac chains bilaterally, nonspecific but potentially reactive.
5. Hepatic steatosis.
6. Bilateral renal cysts.

## 2018-10-04 MED ORDER — SODIUM CHLORIDE 0.9 % IV BOLUS
1000.0000 mL | Freq: Once | INTRAVENOUS | Status: AC
  Administered 2018-10-04: 1000 mL via INTRAVENOUS

## 2018-10-04 MED ORDER — POTASSIUM CHLORIDE CRYS ER 20 MEQ PO TBCR
20.0000 meq | EXTENDED_RELEASE_TABLET | Freq: Once | ORAL | Status: AC
  Administered 2018-10-04: 20 meq via ORAL
  Filled 2018-10-04: qty 1

## 2018-10-04 MED ORDER — LACTATED RINGERS IV SOLN
INTRAVENOUS | Status: AC
  Administered 2018-10-04 – 2018-10-05 (×3): via INTRAVENOUS

## 2018-10-04 MED ORDER — WARFARIN SODIUM 7.5 MG PO TABS
7.5000 mg | ORAL_TABLET | Freq: Once | ORAL | Status: AC
  Administered 2018-10-04: 7.5 mg via ORAL
  Filled 2018-10-04: qty 1

## 2018-10-04 MED ORDER — COSYNTROPIN 0.25 MG IJ SOLR
0.2500 mg | Freq: Once | INTRAMUSCULAR | Status: AC
  Administered 2018-10-05: 0.25 mg via INTRAVENOUS
  Filled 2018-10-04: qty 0.25

## 2018-10-04 MED ORDER — LEVOFLOXACIN IN D5W 500 MG/100ML IV SOLN
500.0000 mg | INTRAVENOUS | Status: DC
  Administered 2018-10-04: 500 mg via INTRAVENOUS
  Filled 2018-10-04 (×2): qty 100

## 2018-10-04 NOTE — Progress Notes (Signed)
[place] at [place] NAME: Isaac Cross    MR#:  [id number]  DATE OF BIRTH:  Apr 01, 1961  SUBJECTIVE:   Worked with physical therapy.  Orthostatic vitals with PT but did not get dizzy.  Poor historian.  Sitting in a chair. Continues to have febrile episodes daily. REVIEW OF SYSTEMS:  Review of Systems  Constitutional: Positive for malaise/fatigue. Negative for chills, fever and weight loss.  HENT: Negative for ear discharge, ear pain and nosebleeds.   Eyes: Negative for blurred vision, pain and discharge.  Respiratory: Negative for sputum production, shortness of breath, wheezing and stridor.   Cardiovascular: Negative for chest pain, palpitations, orthopnea and PND.  Gastrointestinal: Negative for abdominal pain, diarrhea, nausea and vomiting.  Genitourinary: Negative for frequency and urgency.  Musculoskeletal: Negative for back pain and joint pain.  Neurological: Negative for sensory change, speech change, focal weakness and weakness.  Psychiatric/Behavioral: Negative for depression and hallucinations. The patient is not nervous/anxious.    DRUG ALLERGIES:   Allergies  Allergen Reactions  . 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 Lipitor [Atorvastatin] Hives and Itching  . Other   . Viagra  [Sildenafil Citrate]     vision loss    VITALS:  Blood pressure (!) 146/77, pulse 93, temperature (!) 101.3 F (38.5 C), temperature source Oral, resp. rate 19, height 6\' 4"  (1.93 m), weight (!) 156 kg, SpO2 97 %.  PHYSICAL EXAMINATION:  Physical Exam   GENERAL:  58 y.o.-year-old patient lying in the bed with no acute distress. Critically ill appearing EYES: Pupils equal, round, reactive to light and accommodation. No scleral icterus.  HEENT: Head atraumatic, normocephalic. Oropharynx and nasopharynx clear.  NECK:  Supple, no jugular venous distention. No thyroid enlargement, no tenderness.  LUNGS: Normal  breath sounds bilaterally, no wheezing, rales,rhonchi or crepitation. No use of accessory muscles of respiration.  Decreased bibasilar breath sounds CARDIOVASCULAR: S1, S2 normal. No murmurs, rubs, or gallops. ABDOMEN: Soft, nontender, nondistended. Bowel sounds present. No organomegaly or mass. EXTREMITIES: Swelling of both legs, bilateral knee dressing.  No  cyanosis, or clubbing.  NEUROLOGIC: Cranial nerves II through XII are intact. Moving all extremities well PSYCHIATRIC: patient is alert and oriented x3 SKIN: No obvious rash, lesion, or ulcer.    LABORATORY PANEL:   CBC Recent Labs  Lab 10/04/18 0542  WBC 10.4  HGB 8.2*  HCT 25.9*  PLT 450*   ------------------------------------------------------------------------------------------------------------------  Chemistries  Recent Labs  Lab 09/29/18 1642  10/02/18 0506  10/04/18 0542  NA  --    < > 137   < > 138  K  --    < > 3.4*   < > 3.4*  CL  --    < > 99   < > 102  CO2  --    < > 24   < > 22  GLUCOSE  --    < > 106*   < > 94  BUN  --    < > 36*   < > 37*  CREATININE  --    < > 8.63*   < > 7.51*  CALCIUM  --    < > 8.3*   < > 8.6*  MG  --    < > 1.9  --   --   AST 38  --   --   --   --   ALT 22  --   --   --   --  ALKPHOS 58  --   --   --   --   BILITOT 0.7  --   --   --   --    < > = values in this interval not displayed.   ------------------------------------------------------------------------------------------------------------------  Cardiac Enzymes No results for input(s): TROPONINI in the last 168 hours. ------------------------------------------------------------------------------------------------------------------  RADIOLOGY:  No results found.  EKG:   Orders placed or performed during the hospital encounter of 09/12/18  . EKG 12-Lead  . EKG 12-Lead  . EKG 12-Lead  . EKG 12-Lead  . EKG 12-Lead  . EKG 12-Lead    ASSESSMENT AND PLAN:    EdwardGradyis a58 y.o.malewith a known history  of arthritis, COPD not on home oxygen, hypertension, sleep apnea, non-insulin-dependent diabetes mellitus presents to hospital secondary to dizziness, weakness and a fall  * Sepsis -s/p Recent bilateral knee replacement surgeries as outpatient and now bilateral knee washouts done this admission - Fluid Cultures negative.  But patient was on antibiotics prior to his washout and sampling - Fungal cultures negative -Appreciate ID consult.   -Patient was on zosyn, doxycycline --now on ceftazidime and daptomycin (last dose 10/31/2018) -completed 7 days of anidulafungin   Will order CT chest and abdomen without contrast due to fever of unknown origin.  Daily febrile episodes.  Question drug fever.  Discussed with Dr. Tama High and we are changing antibiotics.  2. Acute non- Oliguric renal failure-likely ATN from sepsis/hypotension - appreciate nephrology input. -Hemodialysis started 09/22/2018 -Hold lisinopril and metformin and other nephrotoxins.    The patient has good urine output, creatinine down to 7 -We will order IV fluids since he is having significant diuresis.  3. Acute encephalopathy-secondary to metabolic causes, sepsis and uremia. -No focal deficits Patient also went through delirium tremens and ICU delirium  4. Left popliteal vein DVT - was on heparin drip--platelets dropping--IV argatroban--plt count normal--added po coumadin--needs 5 days of bridiging--pharmacy to dose. 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 Diabetes mellitus-2 Continue sliding scale, hold Lantus.  6. DVT prophylaxis- argatroban drip and coumadin  7.  Low cortisol level.  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  Will order ACTH level and cosyntropin test for tomorrow. Patient was on steroids in the ICU which are now stopped.  Anemia of chronic disease.  Stable.  Hypokalemia.  Potassium supplemented  Hypomagnesemia.  Improved with IV magnesium.  Called and  discussed with wife.  Updated on plan of care.  Answered all questions.  CODE STATUS: Full code  TOTAL TIME TAKING CARE OF THIS PATIENT: 35 minutes.   Discharge to skilled nursing facility versus inpatient rehab in 3 to 4 days  Neita Carp M.D on 10/04/2018 at 1:28 PM  Between 7am to 6pm - Pager - [phone]  After 6pm go to [email or web address] - password [email or web address] [facility]  Office  [phone]  CC: Primary care physician; Steele Sizer, MD

## 2018-10-04 NOTE — Progress Notes (Signed)
Pt refuses bipap 

## 2018-10-04 NOTE — Progress Notes (Signed)
OT Cancellation Note  Patient Details Name: Isaac Cross MRN: [id number] DOB: [birth year]   Cancelled Treatment:    Reason Eval/Treat Not Completed: Patient at procedure or test/ unavailable. Pt with transport preparing to take pt out of room for testing/procedure. Will re-attempt OT tx at later date/time.   Jeni Salles, MPH, MS, OTR/L ascom [phone] 10/04/18, 2:24 PM

## 2018-10-04 NOTE — Progress Notes (Addendum)
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   Argatroban drip discontinued 10/03/2018  Allergies  Allergen Reactions  . 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 Lipitor [Atorvastatin] Hives and Itching  . Other   . Viagra  [Sildenafil Citrate]     vision loss    Patient Measurements: Height: 6\' 4"  (193 cm) Weight: (!) 343 lb 14.7 oz (156 kg) IBW/kg (Calculated) : 86.8  Vital Signs: Temp: 98.3 F (36.8 C) (06/17 0106) Temp Source: Oral (06/17 0106) BP: 112/68 (06/16 2341) Pulse Rate: 96 (06/16 2341)  Labs: Recent Labs    10/02/18 0506  10/02/18 1037 10/03/18 0302 10/03/18 0827 10/04/18 0542  HGB 8.7*  --   --  8.8*  --  8.2*  HCT 27.1*  --   --  27.8*  --  25.9*  PLT 503*  --   --  494*  --  450*  APTT 103*   < > 80* 106* 93*  --   LABPROT 29.3*  --   --  30.2*  --  22.2*  INR 2.8*  --   --  2.9*  --  2.0*  CREATININE 8.63*  --   --  8.38*  --  7.51*  CKTOTAL 114  --   --   --   --   --    < > = values in this interval not displayed.    Estimated Creatinine Clearance: 16.9 mL/min (A) (by C-G formula based on SCr of 7.51 mg/dL (H)).   Medical History: Past Medical History:  Diagnosis Date  . Allergy   . Anxiety   . Arthritis    knees  . Asthma, mild intermittent, well-controlled   . Chronic obstructive asthma ([place])   . COPD (chronic obstructive pulmonary disease) ([place])   . Depression with anxiety   . Diabetes ([place])   . GERD (gastroesophageal reflux disease)   . HBP (high blood pressure)   . High cholesterol   . Renal cyst, right   . Sleep apnea    CPAP  . Swelling     Medications:  Medications Prior to Admission  Medication Sig Dispense Refill Last Dose  . acetaminophen (TYLENOL) 500 MG tablet Take 1 tablet (500 mg total) by mouth every 6 (six) hours as needed. 90 tablet 0 09/12/2018 at 0800  . albuterol (PROVENTIL HFA;VENTOLIN HFA) 108 (90 Base) MCG/ACT  inhaler INHALE 2 PUFFS INTO THE LUNGS 4 (FOUR) TIMES DAILY. 6.7 Inhaler 0 09/12/2018 at 0800  . aspirin 81 MG tablet Take 81 mg by mouth daily.   09/12/2018 at 0800  . atenolol (TENORMIN) 25 MG tablet Take 1 tablet (25 mg total) by mouth every evening. 90 tablet 1 09/11/2018 at 2000  . cephALEXin (KEFLEX) 500 MG capsule Take 500 mg by mouth 3 (three) times daily.   09/12/2018 at 0800  . citalopram (CELEXA) 20 MG tablet Take 1 tablet (20 mg total) by mouth daily. 90 tablet 1 09/12/2018 at 0800  . fluticasone furoate-vilanterol (BREO ELLIPTA) 100-25 MCG/INH AEPB Inhale 1 puff into the lungs daily. 180 each 1 09/12/2018 at 0800  . Multiple Vitamins-Minerals (MENS MULTIVITAMIN PLUS) TABS Take by mouth.   09/12/2018 at 0800  . Omega-3 Fatty Acids (FISH OIL) 1000 MG CAPS Take by mouth daily.   09/12/2018 at 0800  . oxyCODONE-acetaminophen (PERCOCET/ROXICET) 5-325 MG tablet Take 1 tablet by mouth every 4 (four) hours.   09/12/2018 at 0800  .  rosuvastatin (CRESTOR) 20 MG tablet TAKE 1 TABLET BY MOUTH EVERY DAY 90 tablet 1 09/12/2018 at 0800  . SENNA-PLUS 8.6-50 MG tablet Take 2 tablets by mouth every morning.   09/12/2018 at 0800  . sulfamethoxazole-trimethoprim (BACTRIM DS) 800-160 MG tablet Take 1 tablet by mouth 2 (two) times a day.   09/12/2018 at 0800  . XARELTO 10 MG TABS tablet Take 10 mg by mouth daily.    09/12/2018 at 0800  . cetirizine (ZYRTEC) 10 MG tablet Take 10 mg by mouth daily.   prn at prn  . diclofenac sodium (VOLTAREN) 1 % GEL APPLY 4 GRAMS TOPICALLY FOUR TIMES A DAY 100 g 2 prn at prn  . lidocaine (XYLOCAINE) 2 % solution Use as directed 15 mLs in the mouth or throat as needed for mouth pain. 100 mL 0 prn at prn  . triamcinolone cream (KENALOG) 0.1 % triamcinolone acetonide 0.1 % topical cream   prn at prn    Assessment: 58 yr male had B/l knee replacements Sep 01, 2018 was admitted with fever, AKI/ Platelet count has been declining since 6/5. He was started on heparin 6/3. 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 Level is therapeutic and has decreased in relation to argatroban drip being discontinued. Will give patient warfarin 7.5 mg PO x 1 tonight and will continue to trend INRs and monitor w/ am labs. Patient on Ceftazidime and Daptomycin  Noralee Space, PharmD Clinical Pharmacist 10/04/2018 7:23 AM

## 2018-10-04 NOTE — Consult Note (Signed)
[facility] Consult Follow-up  Reason for Consult: Cognitive impairment, moca score 7 out of 30 Per physical therapy Referring Physician: Dr. 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 Total Time spent with patient: 35 minutes  Subjective: " Had I known I would be in the hospital this long without being able to see my wife, I would have not had this surgery."  HPI:   Isaac Cross is a 58 y.o. male patient   admitted on 09/12/2018 with a known history of arthritis, COPD not on home oxygen, hypertension, sleep apnea, non-insulin-dependent diabetes mellitus presents to hospital secondary to dizziness, weakness and a fall this morning. Patient had bilateral knee replacement surgeries done about 9 days ago.  He did well for the first week.  Over the last 3 to 4 days he has been having fevers and chills.  Initially had increased frequency of urination that has decreased the last 3 days.  His urine output has been decreased.  He has been feeling dizzy and lightheaded.  Has been nauseous and had one episode of vomiting.  Denies any abdominal pain or diarrhea.  Denies any exposure to sick contacts.  His COVID-19 test is negative here.  Denies any cough or respiratory symptoms or chest pain.  He states that he fell out of bed onto his knees this morning and hurt both his knees.  After the surgery he has been getting home health physical therapy and was able to walk using his walker. In the ED his creatinine is increased from 1 at baseline to 3.7 today.  He received 4.5 L of fluids in the emergency room and his urine output after a Foley catheter was placed  here was only 75 cc.  He is hyponatremic, acidotic.  Urine analysis showing possible infection.  Has lactic acidosis with elevated WBC and procalcitonin.  He is being admitted for sepsis.  Psychiatry consultation is requested for fluctuations in cognition. Record review does indicate that patient has had delirium while hospitalized.  Past Psychiatric History: Anxiety and depression; likely adjustment disorder due to bilateral knee replacement surgeries followed by acute sepsis; delirium per medical records.  On evaluation this afternoon, patient is calm and cooperative.  He is alert and oriented x4.  He is situationally aware of hospitalization, personal events/illness and world events.  Patient endorses feeling sad about missing his wife and his dog.  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  He endorses a sense of guilt that his wife has not traveled, partly because of COVID-19 but he feels responsible that she is staying because he is in the hospital.  He hopes that she will go to [place] to visit her family as they are uncertain when he will be discharged from the hospital.  Patient does not desire medication changes. He is denying any suicidal ideation.  He is denying any homicidal ideation.  Provided support and encouragement.  Risk to Self:  None Risk to Others:  None Prior Inpatient Therapy:  None Prior Outpatient Therapy:  None  Past  Medical History:  Past Medical History:  Diagnosis Date  . Allergy   . Anxiety   . Arthritis    knees  . Asthma, mild intermittent, well-controlled   . Chronic obstructive asthma ([place])   . COPD (chronic obstructive pulmonary disease) ([place])   . Depression with anxiety   . Diabetes ([place])   . GERD (gastroesophageal reflux disease)   . HBP (high blood pressure)   . High cholesterol   . Renal cyst, right   . Sleep apnea     CPAP  . Swelling     Past Surgical History:  Procedure Laterality Date  . BLADDER REPAIR    . COLONOSCOPY WITH PROPOFOL N/A 04/07/2015   Procedure: COLONOSCOPY WITH PROPOFOL;  Surgeon: Lucilla Lame, MD;  Location: [facility];  Service: Endoscopy;  Laterality: N/A;  Diabetic - oral meds CPAP  . HAND SURGERY     4TH AND 5TH FINGER  . I&D KNEE WITH POLY EXCHANGE Right 09/18/2018   Procedure: IRRIGATION AND DEBRIDEMENT KNEE WITH POLY EXCHANGE;  Surgeon: Lovell Sheehan, MD;  Location: [facility];  Service: Orthopedics;  Laterality: Right;  . KNEE ARTHROSCOPY Right   . 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 TOTAL KNEE ARTHROPLASTY Left 09/17/2018   Procedure: I &D left knee and polyethylene exchange;  Surgeon: Lovell Sheehan, MD;  Location: [facility];  Service: Orthopedics;  Laterality: Left;  Marland Kitchen VEIN SURGERY Right    Family History:  Family History  Problem Relation Age of Onset  . Hypertension Mother   . Diabetes Mother   . Diabetes Father   . Hypertension Father   . Cancer Father   . Seizures Daughter   . Lupus Daughter   . Cancer Maternal Grandfather        Prostate  . Diabetes Paternal Grandfather   . Prostate cancer Maternal Uncle   . Bladder Cancer Neg Hx   . Kidney cancer Neg Hx    Family Psychiatric  History: Denies  Social History:  Social History   Substance and Sexual Activity  Alcohol Use Yes  . Alcohol/week: 10.0 standard drinks  . Types: 10 Cans of beer per week   Comment: occasional     Social History   Substance and Sexual Activity  Drug Use No    Social History   Socioeconomic History  . Marital status: Married    Spouse name: Not on file  . Number of children: 2  . Years of education: Not on file  . Highest education level: Bachelor's degree (e.g., BA, AB, BS)  Occupational History  . Occupation: case Freight forwarder   Social Needs  . Financial resource strain: Not hard at all  . Food insecurity    Worry: Never true     Inability: Never true  . Transportation needs    Medical: No    Non-medical: No  Tobacco Use  . Smoking status: Former Smoker    Packs/day: 1.00    Years: 10.00    Pack years: 10.00    Types: Cigarettes    Start date: 04/20/1995    Quit date: 04/19/2005    Years since quitting: 13.4  . Smokeless tobacco: Never Used  Substance and Sexual Activity  . Alcohol use: Yes    Alcohol/week: 10.0 standard drinks    Types: 10 Cans of beer per week    Comment: occasional  . Drug use: No  . Sexual activity: Yes    Partners: Female  Lifestyle  . Physical activity  Days per week: 0 days    Minutes per session: 0 min  . Stress: Not at all  Relationships  . Social connections    Talks on phone: More than three times a week    Gets together: Once a week    Attends religious service: Never    Active member of club or organization: No    Attends meetings of clubs or organizations: Never    Relationship status: Married  Other Topics Concern  . Not on file  Social History Narrative   Lives at home with wife, using a walker since his surgery recently.   Additional Social History:  Patient is married to his current wife, [name] of 97 years.  He has 2 children ages 47 and 54 from his previous wife. 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    Allergies:   Allergies  Allergen Reactions  . 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 Lipitor [Atorvastatin] Hives and Itching  . Other   . Viagra  [Sildenafil Citrate]     vision loss    Labs:  Results for orders placed or performed during the hospital encounter of 09/12/18 (from the past 48 hour(s))  Glucose, capillary     Status: None   Collection Time: 10/02/18 11:37 AM  Result Value Ref Range   Glucose-Capillary 82 70 - 99 mg/dL   Comment 1 Notify RN   Glucose, capillary     Status: None   Collection Time: 10/02/18  5:06 PM  Result  Value Ref Range   Glucose-Capillary 94 70 - 99 mg/dL  CULTURE, BLOOD (ROUTINE X 2) w Reflex to ID Panel     Status: None (Preliminary result)   Collection Time: 10/02/18  5:26 PM   Specimen: BLOOD  Result Value Ref Range   Specimen Description BLOOD BLOOD RIGHT FOREARM    Special Requests      BOTTLES DRAWN AEROBIC AND ANAEROBIC Blood Culture adequate volume   Culture      NO GROWTH 2 DAYS Performed at [facility], [address] [place], [place] [address]    Report Status PENDING   Cortisol-am, blood     Status: Abnormal   Collection Time: 10/02/18  5:26 PM  Result Value Ref Range   Cortisol - AM 4.1 (L) 6.7 - 22.6 ug/dL    Comment: Performed at [facility], [place] [address] [place], [place] [address]  CULTURE, BLOOD (ROUTINE X 2) w Reflex to ID Panel     Status: None (Preliminary result)   Collection Time: 10/02/18  5:41 PM   Specimen: BLOOD  Result Value Ref Range   Specimen Description BLOOD BLOOD RIGHT HAND    Special Requests      BOTTLES DRAWN AEROBIC AND ANAEROBIC Blood Culture adequate volume   Culture      NO GROWTH 2 DAYS Performed at [facility], [address] [place], [place] [address]    Report Status PENDING   Glucose, capillary     Status: None   Collection Time: 10/02/18 10:32 PM  Result Value Ref Range   Glucose-Capillary 81 70 - 99 mg/dL  Glucose, capillary     Status: None   Collection Time: 10/03/18  2:23 AM  Result Value Ref Range   Glucose-Capillary 90 70 - 99 mg/dL   Comment 1 Notify RN   Protime-INR  Status: Abnormal   Collection Time: 10/03/18  3:02 AM  Result Value Ref Range   Prothrombin Time 30.2 (H) 11.4 - 15.2 seconds   INR 2.9 (H) 0.8 - 1.2    Comment: (NOTE) INR goal varies based on device and disease states. Performed at [facility], [address] [place], [place] [address]   Basic metabolic panel     Status: Abnormal   Collection Time: 10/03/18  3:02 AM  Result Value Ref Range    Sodium 138 135 - 145 mmol/L   Potassium 3.7 3.5 - 5.1 mmol/L   Chloride 101 98 - 111 mmol/L   CO2 24 22 - 32 mmol/L   Glucose, Bld 101 (H) 70 - 99 mg/dL   BUN 37 (H) 6 - 20 mg/dL   Creatinine, Ser 8.38 (H) 0.61 - 1.24 mg/dL   Calcium 8.5 (L) 8.9 - 10.3 mg/dL   GFR calc non Af Amer 6 (L) >60 mL/min   GFR calc Af Amer 7 (L) >60 mL/min   Anion gap 13 5 - 15    Comment: Performed at [facility], [address] [place], [place] [address]  APTT     Status: Abnormal   Collection Time: 10/03/18  3:02 AM  Result Value Ref Range   aPTT 106 (H) 24 - 36 seconds    Comment:        IF BASELINE aPTT IS ELEVATED, SUGGEST PATIENT RISK ASSESSMENT BE USED TO DETERMINE APPROPRIATE ANTICOAGULANT THERAPY. Performed at [facility], [address] [place], [place] [address]   CBC     Status: Abnormal   Collection Time: 10/03/18  3:02 AM  Result Value Ref Range   WBC 10.4 4.0 - 10.5 K/uL   RBC 3.39 (L) 4.22 - 5.81 MIL/uL   Hemoglobin 8.8 (L) 13.0 - 17.0 g/dL   HCT 27.8 (L) 39.0 - 52.0 %   MCV 82.0 80.0 - 100.0 fL   MCH 26.0 26.0 - 34.0 pg   MCHC 31.7 30.0 - 36.0 g/dL   RDW 15.2 11.5 - 15.5 %   Platelets 494 (H) 150 - 400 K/uL   nRBC 0.0 0.0 - 0.2 %    Comment: Performed at [facility], [address] [place], [place] [address]  Glucose, capillary     Status: None   Collection Time: 10/03/18  8:02 AM  Result Value Ref Range   Glucose-Capillary 86 70 - 99 mg/dL  APTT     Status: Abnormal   Collection Time: 10/03/18  8:27 AM  Result Value Ref Range   aPTT 93 (H) 24 - 36 seconds    Comment:        IF BASELINE aPTT IS ELEVATED, SUGGEST PATIENT RISK ASSESSMENT BE USED TO DETERMINE APPROPRIATE ANTICOAGULANT THERAPY. Performed at [facility], [address] [place], [place] [address]   Glucose, capillary     Status: Abnormal   Collection Time: 10/03/18 12:08 PM  Result Value Ref Range   Glucose-Capillary 122 (H) 70 - 99 mg/dL  Cortisol-am, blood      Status: Abnormal   Collection Time: 10/03/18  3:22 PM  Result Value Ref Range   Cortisol - AM 4.0 (L) 6.7 - 22.6 ug/dL    Comment: Performed at [facility] [address], [place], [place] [address]  Glucose, capillary     Status: None   Collection Time: 10/03/18  5:08 PM  Result Value Ref Range   Glucose-Capillary 90 70 - 99  mg/dL  Glucose, capillary     Status: None   Collection Time: 10/03/18  8:24 PM  Result Value Ref Range   Glucose-Capillary 91 70 - 99 mg/dL  Protime-INR     Status: Abnormal   Collection Time: 10/04/18  5:42 AM  Result Value Ref Range   Prothrombin Time 22.2 (H) 11.4 - 15.2 seconds   INR 2.0 (H) 0.8 - 1.2    Comment: (NOTE) INR goal varies based on device and disease states. Performed at [facility], [address] [place], [place] [address]   Basic metabolic panel     Status: Abnormal   Collection Time: 10/04/18  5:42 AM  Result Value Ref Range   Sodium 138 135 - 145 mmol/L   Potassium 3.4 (L) 3.5 - 5.1 mmol/L   Chloride 102 98 - 111 mmol/L   CO2 22 22 - 32 mmol/L   Glucose, Bld 94 70 - 99 mg/dL   BUN 37 (H) 6 - 20 mg/dL   Creatinine, Ser 7.51 (H) 0.61 - 1.24 mg/dL   Calcium 8.6 (L) 8.9 - 10.3 mg/dL   GFR calc non Af Amer 7 (L) >60 mL/min   GFR calc Af Amer 8 (L) >60 mL/min   Anion gap 14 5 - 15    Comment: Performed at [facility], [address] [place], [place] [address]  CBC     Status: Abnormal   Collection Time: 10/04/18  5:42 AM  Result Value Ref Range   WBC 10.4 4.0 - 10.5 K/uL   RBC 3.14 (L) 4.22 - 5.81 MIL/uL   Hemoglobin 8.2 (L) 13.0 - 17.0 g/dL   HCT 25.9 (L) 39.0 - 52.0 %   MCV 82.5 80.0 - 100.0 fL   MCH 26.1 26.0 - 34.0 pg   MCHC 31.7 30.0 - 36.0 g/dL   RDW 15.8 (H) 11.5 - 15.5 %   Platelets 450 (H) 150 - 400 K/uL   nRBC 0.0 0.0 - 0.2 %    Comment: Performed at [facility], [address] [place], [place] [address]  Sedimentation rate     Status: Abnormal   Collection Time:  10/04/18  5:42 AM  Result Value Ref Range   Sed Rate 127 (H) 0 - 20 mm/hr    Comment: Performed at [facility], [address] [place], [place] [address]  Glucose, capillary     Status: None   Collection Time: 10/04/18  8:47 AM  Result Value Ref Range   Glucose-Capillary 78 70 - 99 mg/dL   Comment 1 Notify RN    Comment 2 Document in Chart     Current Facility-Administered Medications  Medication Dose Route Frequency Provider Last Rate Last Dose  . 0.9 %  sodium chloride infusion   Intravenous PRN Flora Lipps, MD 5 mL/hr at 09/29/18 0242    . 0.9 %  sodium chloride infusion  250 mL Intravenous PRN Flora Lipps, MD 0 mL/hr at 09/30/18 1534    . acetaminophen (TYLENOL) tablet 650 mg  650 mg Oral Q6H PRN Lovell Sheehan, MD   650 mg at 10/04/18 0831   Or  . acetaminophen (TYLENOL) suppository 650 mg  650 mg Rectal Q6H PRN Lovell Sheehan, MD   650 mg at 09/20/18 0444  . albuterol (PROVENTIL) (2.5 MG/3ML) 0.083% nebulizer solution 2.5 mg  2.5 mg Nebulization Q6H PRN Lovell Sheehan, MD      . alum & mag hydroxide-simeth (MAALOX/MYLANTA) 200-200-20 MG/5ML suspension 30 mL  30 mL Oral Q4H PRN Fritzi Mandes, MD      . anticoagulant sodium citrate solution 5 mL  5 mL Intravenous Q dialysis Lateef, [place], MD   5 mL at 09/29/18 1837  . aspirin EC tablet 81 mg  81 mg Oral Daily Lovell Sheehan, MD   81 mg at 10/04/18 0254  . atenolol (TENORMIN) tablet 25 mg  25 mg Oral Daily Fritzi Mandes, MD   25 mg at 10/04/18 2706  . cefTAZidime (FORTAZ) 1 g in sodium chloride 0.9 % 100 mL IVPB  1 g Intravenous Q24H Ravishankar, Joellyn Quails, MD 200 mL/hr at 10/03/18 1746 1 g at 10/03/18 1746  . Chlorhexidine Gluconate Cloth 2 % PADS 6 each  6 each Topical Q0600 Lovell Sheehan, MD   6 each at 10/02/18 [phone]  . citalopram (CELEXA) tablet 20 mg  20 mg Oral Daily Fritzi Mandes, MD   20 mg at 10/04/18 0832  . DAPTOmycin (CUBICIN) 700 mg in sodium chloride 0.9 % IVPB  700 mg Intravenous Q48H Berton Mount,  RPH 228 mL/hr at 10/03/18 2032 700 mg at 10/03/18 2032  . feeding supplement (NEPRO CARB STEADY) liquid 237 mL  237 mL Oral BID BM Flora Lipps, MD 237 mL/hr at 09/25/18 0951 237 mL at 10/04/18 0833  . fentaNYL (SUBLIMAZE) injection 12.5 mcg  12.5 mcg Intravenous Q8H PRN Awilda Bill, NP   12.5 mcg at 09/22/18 1756  . fluticasone furoate-vilanterol (BREO ELLIPTA) 100-25 MCG/INH 1 puff  1 puff Inhalation Daily Fritzi Mandes, MD   1 puff at 10/04/18 [phone]  . folic acid (FOLVITE) tablet 1 mg  1 mg Oral Daily Fritzi Mandes, MD   1 mg at 10/04/18 5176  . HYDROcodone-acetaminophen (NORCO/VICODIN) 5-325 MG per tablet 1-2 tablet  1-2 tablet Oral Q4H PRN Lov[place] Sheehan, MD   2 tablet at 09/19/18 0450  . insulin aspart (novoLOG) injection 0-5 Units  0-5 Units Subcutaneous QHS Fritzi Mandes, MD      . insulin aspart (novoLOG) injection 0-9 Units  0-9 Units Subcutaneous TID WC Fritzi Mandes, MD   1 Units at 10/03/18 1248  . iohexol (OMNIPAQUE) 240 MG/ML injection 50 mL  50 mL Oral Once PRN Awilda Bill, NP      . ipratropium-albuterol (DUONEB) 0.5-2.5 (3) MG/3ML nebulizer solution 3 mL  3 mL Nebulization Q6H PRN Flora Lipps, MD   3 mL at 09/25/18 0721  . MEDLINE mouth rinse  15 mL Mouth Rinse BID Flora Lipps, MD   15 mL at 10/04/18 0833  . menthol-cetylpyridinium (CEPACOL) lozenge 3 mg  1 lozenge Oral PRN Lov[place] Sheehan, MD       Or  . phenol (CHLORASEPTIC) mouth spray 1 spray  1 spray Mouth/Throat PRN Lovell Sheehan, MD      . metoCLOPramide (REGLAN) tablet 5-10 mg  5-10 mg Oral Q8H PRN Lovell Sheehan, MD       Or  . metoCLOPramide (REGLAN) injection 5-10 mg  5-10 mg Intravenous Q8H PRN Lovell Sheehan, MD      . multivitamin with minerals tablet 1 tablet  1 tablet Oral Daily Fritzi Mandes, MD   1 tablet at 10/04/18 0831  . omega-3 acid ethyl esters (LOVAZA) capsule 1 g  1 g Oral Daily Fritzi Mandes, MD   1 g at 10/04/18 0834  . ondansetron (ZOFRAN) tablet 4 mg  4 mg Oral Q6H PRN Lovell Sheehan, MD        Or  .  ondansetron (ZOFRAN) injection 4 mg  4 mg Intravenous Q6H PRN Lovell Sheehan, MD      . sodium chloride flush (NS) 0.9 % injection 3 mL  3 mL Intravenous Q12H Flora Lipps, MD   3 mL at 10/03/18 0919  . sodium chloride flush (NS) 0.9 % injection 3 mL  3 mL Intravenous PRN Flora Lipps, MD   3 mL at 09/24/18 0805  . warfarin (COUMADIN) tablet 7.5 mg  7.5 mg Oral ONCE-1800 Hillary Bow, MD      . Warfarin - Pharmacist Dosing Inpatient   Does not apply [id number] Fritzi Mandes, MD        Musculoskeletal: Strength & Muscle Tone: decreased Gait & Station: unsteady Patient leans: N/A  Psychiatric Specialty Exam: Physical Exam  Nursing note and vitals reviewed. Constitutional: He is oriented to person, place, and time. He appears well-developed and well-nourished. No distress.  HENT:  Head: Normocephalic and atraumatic.  Eyes: EOM are normal.  Neck: Normal range of motion.  Cardiovascular: Normal rate and regular rhythm.  Respiratory: Effort normal. No respiratory distress.  Musculoskeletal: Normal range of motion.  Neurological: He is alert and oriented to person, place, and time.    Review of Systems  Constitutional: Negative.   Respiratory: Negative.   Cardiovascular: Negative.   Gastrointestinal: Negative.   Musculoskeletal: Negative.   Neurological: Negative.     Blood pressure (!) 146/77, pulse 93, temperature (!) 101.3 F (38.5 C), temperature source Oral, resp. rate 19, height 6\' 4"  (1.93 m), weight (!) 156 kg, SpO2 97 %.Body mass index is 41.86 kg/m.  General Appearance: Casual and Neat  Eye Contact:  Good  Speech:  Clear and Coherent and Normal Rate  Volume:  Normal  Mood:  Dysphoric  Affect:  Appropriate  Thought Process:  Coherent, Linear and Descriptions of Associations: Intact  Orientation:  Full (Time, Place, and Person)  Thought Content:  Logical and Hallucinations: None  Suicidal Thoughts:  No  Homicidal Thoughts:  No  Memory:  Good  Judgement:  Fair   Insight:  Fair  Psychomotor Activity:  Decreased  Concentration:  Concentration: Good and Attention Span: Good  Recall:  [place] of Knowledge:  Good  Language:  Good  Akathisia:  No  Handed:  Right  AIMS (if indicated):     Assets:  Communication Skills Desire for Improvement Financial Resources/Insurance Housing Intimacy Social Support Talents/Skills Vocational/Educational  ADL's:  Impaired  Cognition:  WNL  Sleep:   Patient has obstructive sleep apnea, uses BiPAP at night     Treatment Plan Summary: Daily contact with patient to assess and evaluate symptoms and progress in treatment and Medication management  Continue patient's Celexa 20 mg daily for anxiety and depression.  Patient likely has ongoing delirium which presents as fluctuating cognition.  At time of this assessment patient has full capacity.  The patient's exam is notable for altered sensorium, perceptual disturbances, disorientation and cognitive deficits that appear markedly different than their baseline, suggesting a diagnosis of delirium.  Virtually any medical condition or physiologic stress can precipitate delirium in a susceptible individual, with risk increasing in those with: advanced age, sensory impairments, organic brain disease (stroke, dementia, Parkinsons), psychiatric illness, major chronic medical issues, prolonged hospitalizations, postoperative status, anemia, insomnia/disturbed sleep, and severe pain. Addressing the underlying medical condition and institution of preventative measures are recommended.  - Continue to monitor and treat underlying medical causes of delirium, including infection, electrolyte disturbances, etc. - Delirium precautions - Minimize/avoid deliriogenic meds including:  anticholinergic, opiates, benzodiazepines           - Maintain hydration, oxygenation, nutrition           - Limit use of restraints and catheters           - Normalize sleep patterns by minimizing  nighttime noise, light and interruptions by     -Ensure sleep apnea treatment is provided overnight.             clustering care, opening blinds during the day           - Reorient the patient frequently, provide easily visible clock and calendar           - Provide sensory aids like glasses, hearing aids           - Encourage ambulation, regular activities and visitors to maintain cognitive stimulation   -Patient would benefit from having family members at bedside to reinforce his orientation.  Disposition: No evidence of imminent risk to self or others at present.   Patient does not meet criteria for psychiatric inpatient admission. Supportive therapy provided about ongoing stressors.  Lavella Hammock, MD 10/04/2018 10:46 AM

## 2018-10-04 NOTE — Progress Notes (Signed)
Date of Admission:  09/12/2018    Admitted with fever and swelling of legs /knee on 09/12/18 with AKI following b/l TKA on 09/01/18 hospital course complicated by hypotension, septic shock, worsening AKI/ Alcohol withdrawal with delirium needing sedation. 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 aspirin EC  81 mg Oral Daily  . atenolol  25 mg Oral Daily  . Chlorhexidine Gluconate Cloth  6 each Topical Q0600  . citalopram  20 mg Oral Daily  . feeding supplement (NEPRO CARB STEADY)  237 mL Oral BID BM  . fluticasone furoate-vilanterol  1 puff Inhalation Daily  . folic acid  1 mg Oral Daily  . insulin aspart  0-5 Units Subcutaneous QHS  . insulin aspart  0-9 Units Subcutaneous TID WC  . mouth rinse  15 mL Mouth Rinse BID  . multivitamin with minerals  1 tablet Oral Daily  . omega-3 acid ethyl esters  1 g Oral Daily  . sodium chloride flush  3 mL Intravenous Q12H  . warfarin  7.5 mg Oral ONCE-1800  . Warfarin - Pharmacist Dosing Inpatient   Does not apply q1800    Objective: Vital signs in last 24 hours: Temp:  [98.3 F (36.8 C)-101.3 F (38.5 C)] 101.3 F (38.5 C) (06/17 0747) Pulse Rate:  [93-96] 93 (06/17 0747) Resp:  [18-20] 19 (06/17 0747) BP: (112-146)/(64-77) 146/77 (06/17 0747) SpO2:  [96 %-98 %] 97 % (06/17 0747)  PHYSICAL EXAM:  General: lethargic- on calling his name he will say what and will go back to sleep- startling movement Head: Normocephalic, without obvious abnormality, atraumatic. Eyes: Conjunctivae clear, anicteric sclerae. Pupils are equal ENT scab over the bridge of the nose- some Neck: Supple, symmetrical, no adenopathy, thyroid: non tender no  carotid bruit and no JVD. Back: did not examine Lungs: b/l air entry Heart: s1s2 Abdomen: Soft,  Extremities: b/l leg swelling better Skin: No rashes or lesions. Or bruising Lymph: Cervical, supraclavicular normal. Neurologic: lethargic, confused, startles easily, responds appropriately at times Lab Results Recent Labs    10/03/18 0302 10/04/18 0542  WBC 10.4 10.4  HGB 8.8* 8.2*  HCT 27.8* 25.9*  NA 138 138  K 3.7 3.4*  CL 101 102  CO2 24 22  BUN 37* 37*  CREATININE 8.38* 7.51*   Liver Panel No results for input(s): PROT, ALBUMIN, AST, ALT, ALKPHOS, BILITOT, BILIDIR, IBILI in the last 72 hours. Sedimentation Rate Recent Labs    10/04/18 0542  ESRSEDRATE 127*   Immunology and other work up for fever ANA 09/12/18-& 09/19/18 =Neg ACE level 09/19/18 NEg Complement c4/C3 Neg 09/28/18 ANCA neg 09/28/18 Beta D glucan 09/20/18-Neg CK 09/13/18 was 4516 CK 10/02/18 is 114 TSH 5.055 on 09/15/18 Cortisol on 09/20/18 was 18.4 ( 4 pm) Quantiferon Gold on 09/21/18 indeterminate Hepatitis C from 09/21/18 NR Hepatitis A and B neg  HIV NR 09/20/18  Microbiology: [facility] 09/12/18-NG 09/19/18 BC NG 6/3 fungal culturebloodneg  Synovial fluidculture left5/29/20 Neg Synovial fluid rt knee NG from 09/16/18 Synovial tissue cultureleft knee5/31NG NGS sent to [facility] ( 16s RNA) neg AFB/FUNGAL from left synovial tissue negative from 09/17/18 so far  6/16 Blood  Culture  neg so far  Assessment/Plan: 58 yr male had B/l knee replacements Sep 01, 2018 was admitted with fever, AKI . He was taking bactrim and keflex when he came to the Ed on 5/26/20with fever  Initially feverswere persisting inspite of IV vanco and cefepime and there was no other source (Blood culture neg, urine culture neg, no pneumonia, ) the left knee was aspirated and it had 8000 wbc and he was taken for a wash out on 09/17/18. Rt knee was washed out on 09/18/18 All cultures neg Pts fever was down for 36-48 hrs  and started up again on 09/19/18 at 103. Central line, femoral line were removed Cefepime was changed to zosyn on 6/2 Vanco was discontinued on 09/19/18 -added anidulafungin6/3and doxy 6/3- - he also was started on stress dose steroids on the same day and was also diagnosed with left popliteal DVT on 09/20/18 and started on heparin- Asfungal blood culture negDC anidulafungin0n 09/26/18 Doxy Dc as well. Started daptomycin- currently on dapto and ceftazidime-- for empiric gram positive ( MRSA) and gram negative coverage fever work up done as above-was neg Was afebrile from 6/4-until 6/11- is now having fevers since 6/11- intially low grade but for the past 48 hrs > 101  ? 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-left 5.2cmX 2.6 cm attenuation too high to be classified as adenoma- Bladder wall thickening Will order bladder scan to look for retention CT head after fall- N  Encephalopathyresolved, but recurring now  Was thought to be due to  DT and possible  Drug reaction ( Serotonin syndrome??) , compounded by worsening renal function Had resolved seemingly after starting dialysis   AKI-started dialysison 09/22/18-- Making > 4 L of urine Off dialysis Cr around 7.6 AKI limits use of vanco   Left DVT  ThrombocytopeniaPlatelet countwasDeclining since 09/22/18-HIT , changed toargatroban and platelethas normalized. Now on coumadin  Anemia  Discussed with Dr.Sudini. He will obtain neuroconsult

## 2018-10-04 NOTE — Progress Notes (Signed)
S/P fall.  Spoke to [employer] engineer, contracting) and she did not want to approve visitation by family at this time.  She will contact the wife directly to explain the situation.

## 2018-10-04 NOTE — Progress Notes (Signed)
Was the fall witnessed: no  Patient condition before and after the fall: patient intermittently confused before and after.   Patient's reaction to the fall: patient states he did not hit his head  Name of the doctor that was notified including date and time: sudini [id number]  Any interventions and vital signs: vital signs obtained.  Patient will get CT chest/abdomen/head.

## 2018-10-04 NOTE — Progress Notes (Signed)
[facility]  ROUNDING NOTE   Subjective:   Tmax 101.3  Fall today. With orthostatic symptoms  UOP 2350  Started on IV fluids  Objective:  Vital signs in last 24 hours:  Temp:  [98.3 F (36.8 C)-101.3 F (38.5 C)] 98.6 F (37 C) (06/17 1408) Pulse Rate:  [93-105] 105 (06/17 1408) Resp:  [18-20] 18 (06/17 1400) BP: (112-146)/(64-103) 114/74 (06/17 1408) SpO2:  [96 %-100 %] 100 % (06/17 1408)  Weight change:  Filed Weights   09/29/18 0630 09/29/18 1445 09/29/18 1823  Weight: (!) 157.1 kg (!) 157 kg (!) 156 kg    Intake/Output: I/O last 3 completed shifts: In: 407.2 [P.O.:240; I.V.:53.2; IV Piggyback:114] Out: 3250 [Urine:3250]   Intake/Output this shift:  No intake/output data recorded.  Physical Exam: General: NAD  Head: Moist oral mucosal membranes  Eyes: Anicteric   Lungs:  clear  Heart: regular  Abdomen:  Soft, nontender, obese   Extremities: + peripheral edema  Neurologic: Awake, alert, conversant  Skin: warm        Basic Metabolic Panel: Recent Labs  Lab 09/29/18 1657  09/30/18 0939 10/01/18 0745 10/02/18 0506 10/03/18 0302 10/04/18 0542  NA  --   --  135 134* 137 138 138  K  --    < > 3.3* 3.4* 3.4* 3.7 3.4*  CL  --   --  98 98 99 101 102  CO2  --   --  26 25 24 24 22   GLUCOSE  --   --  100* 94 106* 101* 94  BUN  --   --  36* 37* 36* 37* 37*  CREATININE  --   --  8.21* 8.74* 8.63* 8.38* 7.51*  CALCIUM  --   --  7.7* 7.9* 8.3* 8.5* 8.6*  MG  --   --   --  1.7 1.9  --   --   PHOS 5.3*  --   --   --   --   --   --    < > = values in this interval not displayed.    Liver Function Tests: Recent Labs  Lab 09/29/18 1642  AST 38  ALT 22  ALKPHOS 58  BILITOT 0.7  PROT 5.6*  ALBUMIN 1.8*   No results for input(s): LIPASE, AMYLASE in the last 168 hours. No results for input(s): AMMONIA in the last 168 hours.  CBC: Recent Labs  Lab 09/30/18 0535 10/01/18 0427 10/02/18 0506 10/03/18 0302 10/04/18 0542  WBC 10.2 10.9*  10.3 10.4 10.4  HGB 8.4* 8.5* 8.7* 8.8* 8.2*  HCT 25.7* 27.0* 27.1* 27.8* 25.9*  MCV 81.6 84.1 81.6 82.0 82.5  PLT 504* 499* 503* 494* 450*    Cardiac Enzymes: Recent Labs  Lab 09/29/18 0504 10/02/18 0506  CKTOTAL 89 114    BNP: Invalid input(s): POCBNP  CBG: Recent Labs  Lab 10/03/18 1708 10/03/18 2024 10/04/18 0847 10/04/18 1137 10/04/18 1357  GLUCAP 90 91 78 86 82    Microbiology: Results for orders placed or performed during the hospital encounter of 09/12/18  Blood Culture (routine x 2)     Status: None   Collection Time: 09/12/18  9:56 AM   Specimen: BLOOD LEFT HAND  Result Value Ref Range Status   Specimen Description BLOOD LEFT HAND  Final   Special Requests   Final    BOTTLES DRAWN AEROBIC AND ANAEROBIC Blood Culture adequate volume   Culture   Final    NO GROWTH 5 DAYS Performed at  [facility], [address] [place], [place] [address]    Report Status 09/17/2018 FINAL  Final  Urine culture     Status: None   Collection Time: 09/12/18 10:01 AM   Specimen: Urine, Random  Result Value Ref Range Status   Specimen Description   Final    URINE, RANDOM Performed at [facility], [address] [place], [place] [address]    Special Requests   Final    NONE Performed at [facility], [address], [place], [place] [address]    Culture   Final    NO GROWTH Performed at [facility], [place] [address], [place], [place] [address]    Report Status 09/13/2018 FINAL  Final  SARS Coronavirus 2 (CEPHEID- Performed in [place] hospital lab), Hosp Order     Status: None   Collection Time: 09/12/18 10:02 AM   Specimen: Nasopharyngeal Swab  Result Value Ref Range Status   SARS Coronavirus 2 NEGATIVE NEGATIVE Final    Comment: (NOTE) If result is NEGATIVE SARS-CoV-2 target nucleic acids are NOT DETECTED. The SARS-CoV-2 RNA is generally detectable in upper and lower  respiratory specimens during the acute phase of  infection. The lowest  concentration of SARS-CoV-2 viral copies this assay can detect is 250  copies / mL. A negative result does not preclude SARS-CoV-2 infection  and should not be used as the sole basis for treatment or other  patient management decisions.  A negative result may occur with  improper specimen collection / handling, submission of specimen other  than nasopharyngeal swab, presence of viral mutation(s) within the  areas targeted by this assay, and inadequate number of viral copies  (<250 copies / mL). A negative result must be combined with clinical  observations, patient history, and epidemiological information. If result is POSITIVE SARS-CoV-2 target nucleic acids are DETECTED. The SARS-CoV-2 RNA is generally detectable in upper and lower  respiratory specimens dur ing the acute phase of infection.  Positive  results are indicative of active infection with SARS-CoV-2.  Clinical  correlation with patient history and other diagnostic information is  necessary to determine patient infection status.  Positive results do  not rule out bacterial infection or co-infection with other viruses. If result is PRESUMPTIVE POSTIVE SARS-CoV-2 nucleic acids MAY BE PRESENT.   A presumptive positive result was obtained on the submitted specimen  and confirmed on repeat testing.  While 2019 novel coronavirus  (SARS-CoV-2) nucleic acids may be present in the submitted sample  additional confirmatory testing may be necessary for epidemiological  and / or clinical management purposes  to differentiate between  SARS-CoV-2 and other Sarbecovirus currently known to infect humans.  If clinically indicated additional testing with an alternate test  methodology [phone]) is advised. The SARS-CoV-2 RNA is generally  detectable in upper and lower respiratory sp ecimens during the acute  phase of infection. The expected result is Negative. Fact Sheet for Patients:   StrictlyIdeas.no Fact Sheet for Healthcare Providers: BankingDealers.co.za This test is not yet approved or cleared by the [place] FDA and has been authorized for detection and/or diagnosis of SARS-CoV-2 by FDA under an Emergency Use Authorization (EUA).  This EUA will remain in effect (meaning this test can be used) for the duration of the COVID-19 declaration under Section 564(b)(1) of the Act, 21 U.S.C. section 360bbb-3(b)(1), unless the authorization is terminated or revoked sooner. Performed at [facility], [address] [place], [place] [address]   Blood  Culture (routine x 2)     Status: None   Collection Time: 09/12/18 10:06 AM   Specimen: BLOOD  Result Value Ref Range Status   Specimen Description BLOOD LEFT ANTECUBITAL  Final   Special Requests   Final    BOTTLES DRAWN AEROBIC AND ANAEROBIC Blood Culture adequate volume   Culture   Final    NO GROWTH 5 DAYS Performed at [facility], [address] [place], [place] [address]    Report Status 09/17/2018 FINAL  Final  MRSA PCR Screening     Status: None   Collection Time: 09/12/18  7:04 PM   Specimen: Nasal Mucosa; Nasopharyngeal  Result Value Ref Range Status   MRSA by PCR NEGATIVE NEGATIVE Final    Comment:        The GeneXpert MRSA Assay (FDA approved for NASAL specimens only), is one component of a comprehensive MRSA colonization surveillance program. It is not intended to diagnose MRSA infection nor to guide or monitor treatment for MRSA infections. Performed at [facility], [address] [place], [place] [address]   C difficile quick scan w PCR reflex     Status: None   Collection Time: 09/14/18  2:21 PM   Specimen: STOOL  Result Value Ref Range Status   C Diff antigen NEGATIVE NEGATIVE Final   C Diff toxin NEGATIVE NEGATIVE Final   C Diff interpretation No C. difficile detected.  Final    Comment: Performed at  [facility], [address] [place], [place] [address]  Group A Strep by PCR     Status: None   Collection Time: 09/15/18  1:53 PM  Result Value Ref Range Status   Group A Strep by PCR NOT DETECTED NOT DETECTED Final    Comment: Performed at [facility], [address], [place], [place] [address]  Body fluid culture     Status: None   Collection Time: 09/15/18  5:13 PM   Specimen: KNEE  Result Value Ref Range Status   Specimen Description   Final    KNEE Performed at [facility], [address] [place], [place] [address]    Special Requests   Final    NONE Performed at [facility], [address] [place], [place] [address]    Gram Stain   Final    RARE RBCS FEW WBC SEEN NO ORGANISMS SEEN Performed at [facility], [address] [place], [place] [address]    Culture   Final    NO GROWTH 3 DAYS Performed at [facility], [place] [address] [place], [place] [address]    Report Status 09/19/2018 FINAL  Final  Body fluid culture     Status: None   Collection Time: 09/16/18  8:55 AM   Specimen: KNEE  Result Value Ref Range Status   Specimen Description KNEE RIGHT  Final   Special Requests NONE  Final   Gram Stain   Final    RARE WBC PRESENT, PREDOMINANTLY PMN NO ORGANISMS SEEN    Culture   Final    NO GROWTH 3 DAYS Performed at [facility], [place] [address] [place], [place] [address]    Report Status 09/20/2018 FINAL  Final  Aerobic/Anaerobic Culture (surgical/deep wound)     Status: None   Collection Time: 09/17/18 11:32 AM   Specimen: Wound  Result Value Ref Range Status   Specimen Description   Final    TISSUE LEFT KNEE 1 Performed  at [facility], [address] [place], [place] [address]    Special Requests PATIENT ON FOLLOWING MAXIPIME VANCOMYCIN  Final   Gram Stain   Final    FEW WBC PRESENT,BOTH PMN AND MONONUCLEAR NO ORGANISMS SEEN    Culture   Final    No growth aerobically or  anaerobically. Performed at [facility], [place] [address], [place], [place] [address]    Report Status 09/22/2018 FINAL  Final  Acid Fast Smear (AFB)     Status: None   Collection Time: 09/17/18 11:32 AM   Specimen: Synovium  Result Value Ref Range Status   AFB Specimen Processing Comment  Final    Comment: Tissue Grinding and Digestion/Decontamination   Acid Fast Smear Negative  Final    Comment: (NOTE) Performed At: [facility] [place], [place] [id number] [place] MD NT:[id number]    Source (AFB) TISSUE  Final    Comment: LEFT KNEE Performed at [facility], [place] [address] [place], [place] [address]   Culture, fungus without smear     Status: None (Preliminary result)   Collection Time: 09/17/18 11:34 AM   Specimen: Synovium; Other  Result Value Ref Range Status   Specimen Description   Final    SYNOVIAL Performed at [facility], [address], [place], [place] [address]    Special Requests   Final    NONE Performed at [facility], [address] [place], [place] [address]    Culture   Final    NO FUNGUS ISOLATED AFTER 14 DAYS Performed at [facility], [place] [address] [place], [place] [address]    Report Status PENDING  Incomplete  Acid Fast Culture with reflexed sensitivities     Status: None   Collection Time: 09/17/18 11:36 AM   Specimen: Synovium  Result Value Ref Range Status   Acid Fast Culture CANC  Final    Comment: (NOTE) Test cancelled at client's request.      Cancelled per Levin Erp 09/21/2018 Performed At: [facility] [place], [place] [id number] [place] Farmer MD QZ:[id number]    Source of Sample TISSUE  Final    Comment: RIGHT KNEE Performed at [facility], [place] [address] [place], [place] [address]   Aerobic/Anaerobic Culture (surgical/deep wound)     Status: None   Collection Time: 09/17/18 11:43 AM   Specimen: Wound  Result Value Ref  Range Status   Specimen Description   Final    TISSUE LEFT KNEE 2 Performed at [facility], [address] [place], [place] [address]    Special Requests PATIENT ON FOLLOWING MAXIPIME VANCOMYCIN  Final   Gram Stain   Final    FEW WBC PRESENT, PREDOMINANTLY PMN NO ORGANISMS SEEN    Culture   Final    No growth aerobically or anaerobically. Performed at [facility], [place] [address], [place], [place] [address]    Report Status 09/22/2018 FINAL  Final  Aerobic/Anaerobic Culture (surgical/deep wound)     Status: None   Collection Time: 09/17/18 11:44 AM   Specimen: Wound  Result Value Ref Range Status   Specimen Description   Final    TISSUE LEFT KNEE 3 Performed at [facility], [address] [place], [place] [address]    Special Requests PATIENT ON FOLLOWING MAXIPIME VANCOMYCIN  Final   Gram Stain   Final    MODERATE WBC PRESENT, PREDOMINANTLY PMN  NO ORGANISMS SEEN    Culture   Final    No growth aerobically or anaerobically. Performed at [facility], [place] [address] [place], [place] [address]    Report Status 09/22/2018 FINAL  Final  Aerobic/Anaerobic Culture (surgical/deep wound)     Status: None   Collection Time: 09/18/18  9:43 PM   Specimen: [facility] Other; Tissue  Result Value Ref Range Status   Specimen Description   Final    SYNOVIAL Performed at [facility], [address] [place], [place] [address]    Special Requests   Final    NONE Performed at [facility], [address] [place], [place] [address]    Gram Stain   Final    RARE WBC PRESENT, PREDOMINANTLY MONONUCLEAR NO ORGANISMS SEEN    Culture   Final    No growth aerobically or anaerobically. Performed at [facility], [place] [address] [place], [place] [address]    Report Status 09/24/2018 FINAL  Final  Aerobic/Anaerobic Culture (surgical/deep wound)     Status: None   Collection Time: 09/18/18  9:44 PM   Specimen: [facility] Other; Tissue   Result Value Ref Range Status   Specimen Description   Final    SYNOVIAL Performed at [facility], [address], [place], [place] [address]    Special Requests   Final    RIGHT KNEE Performed at [facility], [place], [place] [address]    Gram Stain   Final    FEW WBC PRESENT,BOTH PMN AND MONONUCLEAR NO ORGANISMS SEEN    Culture   Final    No growth aerobically or anaerobically. Performed at [facility], [place] [address] [place], [place] [address]    Report Status 09/24/2018 FINAL  Final  Aerobic/Anaerobic Culture (surgical/deep wound)     Status: None   Collection Time: 09/18/18  9:45 PM   Specimen: [facility] Other; Tissue  Result Value Ref Range Status   Specimen Description   Final    SYNOVIAL Performed at [facility], [address], [place], [place] [address]    Special Requests   Final    RIGHT KNEE Performed at [facility], [address] [place], [place] [address]    Gram Stain   Final    FEW WBC PRESENT, PREDOMINANTLY MONONUCLEAR NO ORGANISMS SEEN    Culture   Final    No growth aerobically or anaerobically. Performed at [facility], [place] [address] [place], [place] [address]    Report Status 09/24/2018 FINAL  Final  Culture, blood (Routine X 2) w Reflex to ID Panel     Status: None   Collection Time: 09/19/18  1:11 PM   Specimen: BLOOD  Result Value Ref Range Status   Specimen Description BLOOD LEFT ANTECUBITAL  Final   Special Requests   Final    BOTTLES DRAWN AEROBIC AND ANAEROBIC Blood Culture results may not be optimal due to an excessive volume of blood received in culture bottles   Culture   Final    NO GROWTH 5 DAYS Performed at [facility], [address] [place], [place] [address]    Report Status 09/24/2018 FINAL  Final  Culture, blood (Routine X 2) w Reflex to ID Panel     Status: None   Collection Time: 09/19/18  1:20 PM   Specimen: BLOOD  Result Value Ref  Range Status   Specimen Description BLOOD BLOOD RIGHT  HAND  Final   Special Requests   Final    BOTTLES DRAWN AEROBIC AND ANAEROBIC Blood Culture results may not be optimal due to an excessive volume of blood received in culture bottles   Culture   Final    NO GROWTH 5 DAYS Performed at [facility], [address] [place], [place] [address]    Report Status 09/24/2018 FINAL  Final  Urine Culture     Status: None   Collection Time: 09/20/18  3:59 PM   Specimen: Urine, Random  Result Value Ref Range Status   Specimen Description   Final    URINE, RANDOM Performed at [facility], [address] [place], [place] [address]    Special Requests   Final    NONE Performed at [facility], [address], [place], [place] [address]    Culture   Final    NO GROWTH Performed at [facility], [place] [address] [place], [place] [address]    Report Status 09/21/2018 FINAL  Final  C difficile quick scan w PCR reflex     Status: None   Collection Time: 09/21/18  3:27 PM   Specimen: STOOL  Result Value Ref Range Status   C Diff antigen NEGATIVE NEGATIVE Final   C Diff toxin NEGATIVE NEGATIVE Final   C Diff interpretation No C. difficile detected.  Final    Comment: Performed at [facility], [address] [place], [place] [address]  Gastrointestinal Panel by PCR , Stool     Status: None   Collection Time: 09/21/18  3:27 PM   Specimen: Stool  Result Value Ref Range Status   Campylobacter species NOT DETECTED NOT DETECTED Final   Plesimonas shigelloides NOT DETECTED NOT DETECTED Final   Salmonella species NOT DETECTED NOT DETECTED Final   Yersinia enterocolitica NOT DETECTED NOT DETECTED Final   Vibrio species NOT DETECTED NOT DETECTED Final   Vibrio cholerae NOT DETECTED NOT DETECTED Final   Enteroaggregative E coli (EAEC) NOT DETECTED NOT DETECTED Final   Enteropathogenic E coli (EPEC) NOT DETECTED NOT DETECTED Final   Enterotoxigenic E  coli (ETEC) NOT DETECTED NOT DETECTED Final   Shiga like toxin producing E coli (STEC) NOT DETECTED NOT DETECTED Final   Shigella/Enteroinvasive E coli (EIEC) NOT DETECTED NOT DETECTED Final   Cryptosporidium NOT DETECTED NOT DETECTED Final   Cyclospora cayetanensis NOT DETECTED NOT DETECTED Final   Entamoeba histolytica NOT DETECTED NOT DETECTED Final   Giardia lamblia NOT DETECTED NOT DETECTED Final   Adenovirus F40/41 NOT DETECTED NOT DETECTED Final   Astrovirus NOT DETECTED NOT DETECTED Final   Norovirus GI/GII NOT DETECTED NOT DETECTED Final   Rotavirus A NOT DETECTED NOT DETECTED Final   Sapovirus (I, II, IV, and V) NOT DETECTED NOT DETECTED Final    Comment: Performed at [facility], [address] [place], [place] [address]  Novel Coronavirus, NAA (hospital order; send-out to ref lab)     Status: None   Collection Time: 09/29/18  4:36 PM   Specimen: Nasopharyngeal Swab; Respiratory  Result Value Ref Range Status   SARS-CoV-2, NAA NOT DETECTED NOT DETECTED Final    Comment: (NOTE) This test was developed and its performance characteristics determined by [employer]. This test has not been FDA cleared or approved. This test has been authorized by FDA under an Emergency Use Authorization (EUA). This test is only authorized for the duration of time the declaration that circumstances exist justifying the authorization  of the emergency use of in vitro diagnostic tests for detection of SARS-CoV-2 virus and/or diagnosis of COVID-19 infection under section 564(b)(1) of the Act, 21 U.S.C. 846NGE-9(B)(2), unless the authorization is terminated or revoked sooner. When diagnostic testing is negative, the possibility of a false negative result should be considered in the context of a patient's recent exposures and the presence of clinical signs and symptoms consistent with COVID-19. An individual without symptoms of COVID-19 and who is not shedding SARS-CoV-2 virus  would expect to have a negative (not detected) result in this assay. Performed  At: [facility] [address] [place], [place] [id number] [place] MD UU:[id number]    [place]  Final    Comment: Performed at [facility], [address] [place], [place] [address]  CULTURE, BLOOD (ROUTINE X 2) w Reflex to ID Panel     Status: None (Preliminary result)   Collection Time: 10/02/18  5:26 PM   Specimen: BLOOD  Result Value Ref Range Status   Specimen Description BLOOD BLOOD RIGHT FOREARM  Final   Special Requests   Final    BOTTLES DRAWN AEROBIC AND ANAEROBIC Blood Culture adequate volume   Culture   Final    NO GROWTH 2 DAYS Performed at [facility], [address], [place], [place] [address]    Report Status PENDING  Incomplete  CULTURE, BLOOD (ROUTINE X 2) w Reflex to ID Panel     Status: None (Preliminary result)   Collection Time: 10/02/18  5:41 PM   Specimen: BLOOD  Result Value Ref Range Status   Specimen Description BLOOD BLOOD RIGHT HAND  Final   Special Requests   Final    BOTTLES DRAWN AEROBIC AND ANAEROBIC Blood Culture adequate volume   Culture   Final    NO GROWTH 2 DAYS Performed at [facility], [address] [place], [place] [address]    Report Status PENDING  Incomplete    Coagulation Studies: Recent Labs    10/02/18 0506 10/03/18 0302 10/04/18 0542  LABPROT 29.3* 30.2* 22.2*  INR 2.8* 2.9* 2.0*    Urinalysis: No results for input(s): COLORURINE, LABSPEC, PHURINE, GLUCOSEU, HGBUR, BILIRUBINUR, KETONESUR, PROTEINUR, UROBILINOGEN, NITRITE, LEUKOCYTESUR in the last 72 hours.  Invalid input(s): APPERANCEUR    Imaging: Ct Head Wo Contrast  Result Date: 10/04/2018 CLINICAL DATA:  Dizziness and weakness.  Fall. EXAM: CT HEAD WITHOUT CONTRAST TECHNIQUE: Contiguous axial images were obtained from the base of the skull through the vertex without intravenous contrast. COMPARISON:  Head  CT 09/14/2018 FINDINGS: Brain: There is no mass, hemorrhage or extra-axial collection. The size and configuration of the ventricles and extra-axial CSF spaces are normal. The brain parenchyma is normal, without acute or chronic infarction. Vascular: No abnormal hyperdensity of the major intracranial arteries or dural venous sinuses. No intracranial atherosclerosis. Skull: The visualized skull base, calvarium and extracranial soft tissues are normal. Sinuses/Orbits: Small amount of fluid at the left mastoid tip. No middle ear effusion. Paranasal sinuses are clear. The orbits are normal. IMPRESSION: Normal brain. Electronically Signed   By: Ulyses Jarred M.D.   On: 10/04/2018 15:04     Medications:   . sodium chloride 5 mL/hr at 09/29/18 0242  . sodium chloride 0 mL/hr at 09/30/18 1534  . anticoagulant sodium citrate    . cefTAZidime (FORTAZ)  IV 1 g (10/03/18 1746)  . DAPTOmycin (CUBICIN)  IV 700 mg (10/03/18 2032)  . lactated ringers     . aspirin EC  81 mg Oral Daily  . atenolol  25 mg Oral Daily  . Chlorhexidine Gluconate Cloth  6 each Topical Q0600  . citalopram  20 mg Oral Daily  . [START ON 10/05/2018] cosyntropin  0.25 mg Intravenous Once  . feeding supplement (NEPRO CARB STEADY)  237 mL Oral BID BM  . fluticasone furoate-vilanterol  1 puff Inhalation Daily  . folic acid  1 mg Oral Daily  . insulin aspart  0-5 Units Subcutaneous QHS  . insulin aspart  0-9 Units Subcutaneous TID WC  . mouth rinse  15 mL Mouth Rinse BID  . multivitamin with minerals  1 tablet Oral Daily  . omega-3 acid ethyl esters  1 g Oral Daily  . potassium chloride  20 mEq Oral Once  . sodium chloride flush  3 mL Intravenous Q12H  . warfarin  7.5 mg Oral ONCE-1800  . Warfarin - Pharmacist Dosing Inpatient   Does not apply q1800   sodium chloride, sodium chloride, acetaminophen **OR** acetaminophen, albuterol, alum & mag hydroxide-simeth, anticoagulant sodium citrate, fentaNYL (SUBLIMAZE) injection,  HYDROcodone-acetaminophen, iohexol, ipratropium-albuterol, menthol-cetylpyridinium **OR** phenol, metoCLOPramide **OR** metoCLOPramide (REGLAN) injection, ondansetron **OR** ondansetron (ZOFRAN) IV, sodium chloride flush  Assessment/ Plan:  Isaac Cross is a 58 y.o. black male with hyeprtension, COPD, diabetes mellitus type II who underwent bilateral knee replacement surgeries last week. who was admitted to [facility] on 09/12/2018 for sepsis  1. Acute renal failure with metabolic acidosis  baseline creatinine of 1.07 with normal GFR in 06/12/18.  Likely severe ATN from hypotension and sepsis No indication for dialysis today. Polyuria could be post ATN diuresis - Started on IV fluids - Monitor volume status - Monitor daily for dialysis need  2. Septic shock: bilateral knee replacement on Sep 01, 2018 at [place] septic arthritis, patient underwent I&D and polyethylene exchange on 5/31 and Rt knee on 6/1 - ID recommends 4 weeks of ceftazidime and daptomycin (6mg /kg) until 10/31/2018  3. Hypokalemia -Replace potassium   4. Anemia with renal failure: hemoglobin 8.2 - Discussed EPO with patient.   5. Hypertension: now with orthostatic hypotension symptoms - discontinue atenolol - IV fluids: LR at 144mL/hr   LOS: 22 Isaac Cross 6/17/20203:14 PM

## 2018-10-04 NOTE — Progress Notes (Signed)
Orthostatic vitals taken during PT session this date. Pt was presyncopal with PT on 6/15 and 6/16. Pt is orthostatic this date but asymptomatic, however OOB activity was more limited this date so that safe BP could be established. No additional OOB activity pursued once patient was noted to be hypotensive. RN made aware.     10:54 AM, 10/04/18 Etta Grandchild, PT, DPT Physical Therapist - [facility]  [phone] [facility])

## 2018-10-04 NOTE — TOC Progression Note (Signed)
Transition of Care [facility]) - Progression Note    Patient Details  Name: Isaac Cross MRN: [id number] Date of Birth: 02-24-1961  Transition of Care [facility]) CM/SW Contact  Jeanpierre Thebeau, Lenice Llamas Phone Number: [phone]  10/04/2018, 3:12 PM  Clinical Narrative: Clinical Social Worker ([place]) received a phone call from patient's sister Gayland Curry [phone] asking to extend SNF search to [facility] to see if those facilities will accept patient on IV dapto. CSW faxed out to [facility]. CSW explained to sister that IV dapto is an expensive antibiotic and SNFs will usually not accept patients on it because of the high cost. CSW will continue to follow and assist as needed.     Expected Discharge Plan: [place] Barriers to Discharge: Waiting for outpatient dialysis, Other (comment)(acute renal currently)  Expected Discharge Plan and Services Expected Discharge Plan: [place] In-house Referral: Clinical Social Work Discharge Planning Services: CM Consult Post Acute Care Choice: [place] arrangements for the past 2 months: [place]: (Patient stated that he is open to home health. ) Date [place]: 09/18/18 Time [place]: [address] Representative spoke with at [place]: [name]- unsure of when patient will discharge.   Social Determinants of Health (SDOH) Interventions    Readmission Risk Interventions No flowsheet data found.

## 2018-10-04 NOTE — Progress Notes (Signed)
Inpatient Rehabilitation-Admissions Coordinator   With pt permission, AC spoke with wife via phone to discuss rehab options. AC noted that the pt is still undergoing his medical workup and that we will follow along for progress with therapies. AC stated that we will need to see improvement in overall participation (meaningful participation and improvement in orthostatic vitals while with therapy). Wife aware that pt is currently most appropriate for SNF setting but that we will follow along closely to watch for improvement and medical readiness. Wife appreciative of information.   Will follow up tomorrow.   Jhonnie Garner, OTR/L  Rehab Admissions Coordinator  [phone] 10/04/2018 6:24 PM

## 2018-10-04 NOTE — Progress Notes (Addendum)
Physical Therapy Treatment Patient Details Name: Isaac Cross MRN: [id number] DOB: Sep 16, 1960 Today's Date: 10/04/2018    History of Present Illness Pt is a 58 y.o. male presenting to hospital 09/12/18 after rolling OOB landing on L knee; pt with increased dizziness x2 days, fevers x3 days, and generalized weakness.  S/p B TKR 09/01/18 in [place] (pt reports discharging home same day).  Pt admitted with sepsis, acute renal failure, and B TKR.  Pt s/p L knee 5/31 and R knee 6/1 I&D with poly exchange.  Pt noted with severe encephalopathy from DT's with extensive ETOH abuse.  L LE DVT noted.  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  HD started 6/5 with L IJ temporary HD catheter.  PMH includes B TKR 09/01/18 in [place], DM, asthma, R vein surgery, COPD. Right IJ line removed 6/16. 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    PT Comments    Pt awake upon arrival agreeable to participate. Orthostatic vitals established this date, noted to be positive, albeit activity in more restricted this date (compared to 2 prior days) hence not surprising that patient is asymptomatic. Pt remains alert with flat affect, delayed initiation, delayed response time, and some mild disorientation to details regarding his medical care and use of TV remote, room phone, personal phone. Leg remain weak in general, but performance with mobility and exercises remains consistent over the past 5 days. Pt performs STS from EOB elevated then, takes steps with RW to sit in chair. Will continue to follow. Appreciate input from psych and attending this date.    Follow Up Recommendations  CIR     Equipment Recommendations  3in1 (PT);Other (comment)    Recommendations for Other Services OT consult     Precautions / Restrictions Precautions Precautions: Fall Precaution Comments: Pt has been cognitively impaired with poor awareness of this, not very vocal about dizziness, pain, need ot rest,  etc. Restrictions Weight Bearing Restrictions: Yes RLE Weight Bearing: Weight bearing as tolerated LLE Weight Bearing: Weight bearing as tolerated    Mobility  Bed Mobility Overal bed mobility: Needs Assistance Bed Mobility: Supine to Sit     Supine to sit: HOB elevated;Min assist     General bed mobility comments: minA/tactile cues for BLE slide; Left hand hold offered to pull self to EOB  Transfers Overall transfer level: Needs assistance Equipment used: Rolling walker (2 wheeled) Transfers: Stand Pivot Transfers Sit to Stand: Min guard         General transfer comment: STS from EOB then once patient appeared safe, gave cues for steps to turn backside to chair. Once seated in chair, "standing" BP assessed as 86/65mmHG, compared to 188/56mmHg supine. BP remains low 3x Q60sec while in chair.  Ambulation/Gait Ambulation/Gait assistance: (not appropriate to attempt at this time due to orthostasis in a patient with poor awareness of deficits.)               Stairs             Wheelchair Mobility    Modified Rankin (Stroke Patients Only)       Balance Overall balance assessment: Modified Independent;Mild deficits observed, not formally tested;History of Falls(BLE remain weak; pt heavily reliant on RW for stability) Sitting-balance support: No upper extremity supported;Feet supported  Cognition Arousal/Alertness: Awake/alert Behavior During Therapy: Flat affect Overall Cognitive Status: Impaired/Different from baseline Area of Impairment: Memory;Following commands;Awareness;Attention;Problem solving;Orientation                 Orientation Level: Person;Place;Time(confused today about catherization status, phone use, DC status, how to use mobile device (not unlike other days))   Memory: Decreased short-term memory Following Commands: Follows one step commands with increased time;Follows one step  commands inconsistently(delayed inititiation, nearing ideational apraxia.) Safety/Judgement: Decreased awareness of safety;Decreased awareness of deficits   Problem Solving: Slow processing;Decreased initiation;Requires verbal cues;Difficulty sequencing        Exercises Total Joint Exercises Short Arc Quad: Strengthening;Both;Supine;15 reps;AAROM;Limitations Short Arc Quad Limitations: improved ability to follow commands compared to prior days.    General Comments        Pertinent Vitals/Pain Pain Assessment: Faces Faces Pain Scale: Hurts a little bit Pain Location: Left knee Pain Descriptors / Indicators: Sore;Discomfort;Grimacing Pain Intervention(s): Limited activity within patient's tolerance;Monitored during session    Home Living                      Prior Function            PT Goals (current goals can now be found in the care plan section) Acute Rehab PT Goals Patient Stated Goal: to go home PT Goal Formulation: With patient Time For Goal Achievement: 10/13/18 Potential to Achieve Goals: Fair Additional Goals Additional Goal #1: Assess OOB mobility and update goals/POC as appropriate once R femoral arterial line is removed and pt medically appropriate for OOB mobility Progress towards PT goals: Not progressing toward goals - comment(Cognition, weakness, BP all remain similarly liiting as in prior days)    Frequency    7X/week      PT Plan Current plan remains appropriate    Co-evaluation              AM-PAC PT "6 Clicks" Mobility   Outcome Measure  Help needed turning from your back to your side while in a flat bed without using bedrails?: A Little Help needed moving from lying on your back to sitting on the side of a flat bed without using bedrails?: A Little Help needed moving to and from a bed to a chair (including a wheelchair)?: A Little Help needed standing up from a chair using your arms (e.g., wheelchair or bedside chair)?: A  Little Help needed to walk in hospital room?: A Lot Help needed climbing 3-5 steps with a railing? : Total 6 Click Score: 15    End of Session Equipment Utilized During Treatment: Gait belt Activity Tolerance: Other (comment) Patient left: with call bell/phone within reach;in chair;with chair alarm set;with family/visitor present(pt assisted with calling his wife at end of session) Nurse Communication: Mobility status(Orthostatic; also made MD aware) PT Visit Diagnosis: Other abnormalities of gait and mobility (R26.89);Muscle weakness (generalized) (M62.81);Difficulty in walking, not elsewhere classified (R26.2);Pain Pain - Right/Left: Left Pain - part of body: Knee     Time: 9811-9147 PT Time Calculation (min) (ACUTE ONLY): 44 min  Charges:  $Therapeutic Exercise: 23-37 mins $Therapeutic Activity: 8-22 mins                     12:34 PM, 10/04/18 Etta Grandchild, PT, DPT Physical Therapist - [facility]  [phone] ([place])    [place] C 10/04/2018, 12:29 PM

## 2018-10-04 NOTE — Progress Notes (Signed)
PT Cancellation Note  Patient Details Name: Isaac Cross MRN: [id number] DOB: [birth year]   Cancelled Treatment:    Reason Eval/Treat Not Completed: Patient at procedure or test/unavailable;Medical issues which prohibited therapy(RN reports patient working on taking in PO contrast for pending imaging studies this date. Out in hall, Pryor Curia heard chair alarm, entered room to find patient on floor. Staff alerted and multiple rehab/nursing staff assisting patient floor to bed c lift.)  Will hold off on 2nd PT session this date and allow for workup to occur. Will resume services once medically appropriate.   2:06 PM, 10/04/18 Etta Grandchild, PT, DPT Physical Therapist - [facility]  [phone] ([place])    [place] C 10/04/2018, 2:05 PM

## 2018-10-04 NOTE — Progress Notes (Signed)
Pharmacy Antibiotic Note  Isaac Cross is a 58 y.o. male admitted on 09/12/2018 with sepsis.  Pharmacy has been consulted for Daptomycin dosing.  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  He underwent I&D of both knees.  Cultures remain unrevealing but had received antibiotics prior to surgery.  He developed AKI and was requiring HD but Nephrology is hopeful that his kidney function is returned and may not need further HD (UOP improved).    Day #23 antibiotics/Day #8 daptomyicn/ceftazidime  (s/p 7 days of eraxis) Per ID-plan abx thru 10/31/2018 (4 weeks) per neph note - Scr 8.63 > 8.38 > 7.51  - CK 6/15 = 114 Temp 101.3   Plan:  Ceftazidime 1gm IV q24h by ID.  probably OK due to large size but will need to follow plans for HD  Continue Daptomycin 6mg /kg (per Adj BW for BMI >40) 700mg  IV q48h  Check CK level in 3  days since had recent CK elevation.  His CK 6/9 was WNL at 90 and was stable today  Need to monitor renal function closely as his kidneys function appears to be returning.  If does need a random HD then may require supplemental dose of daptomycin  Monitor HD plans as per nephrology-appears HD catheter may have been removed?  Height: 6\' 4"  (193 cm) Weight: (!) 343 lb 14.7 oz (156 kg) IBW/kg (Calculated) : 86.8  Temp (24hrs), Avg:100.4 F (38 C), Min:98.3 F (36.8 C), Max:101.3 F (38.5 C)  Recent Labs  Lab 09/30/18 0535 09/30/18 0939 10/01/18 0427 10/01/18 0745 10/02/18 0506 10/03/18 0302 10/04/18 0542  WBC 10.2  --  10.9*  --  10.3 10.4 10.4  CREATININE  --  8.21*  --  8.74* 8.63* 8.38* 7.51*    Estimated Creatinine Clearance: 16.9 mL/min (A) (by C-G formula based on SCr of 7.51 mg/dL (H)).    Allergies  Allergen Reactions  . 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 Lipitor [Atorvastatin] Hives and Itching  . Other   . Viagra  [Sildenafil Citrate]     vision loss     Thank you for allowing pharmacy to be a part of this  patient's care.  Chinita Greenland PharmD Clinical Pharmacist 10/04/2018

## 2018-10-05 ENCOUNTER — Inpatient Hospital Stay: Payer: BC Managed Care – PPO

## 2018-10-05 DIAGNOSIS — R531 Weakness: Secondary | ICD-10-CM

## 2018-10-05 LAB — ACTH: C206 ACTH: 445 pg/mL — ABNORMAL HIGH (ref 7.2–63.3)

## 2018-10-05 LAB — GLUCOSE, CAPILLARY
Glucose-Capillary: 80 mg/dL (ref 70–99)
Glucose-Capillary: 98 mg/dL (ref 70–99)
Glucose-Capillary: 108 mg/dL — ABNORMAL HIGH (ref 70–99)
Glucose-Capillary: 115 mg/dL — ABNORMAL HIGH (ref 70–99)

## 2018-10-05 LAB — BASIC METABOLIC PANEL
Anion gap: 13 (ref 5–15)
BUN: 35 mg/dL — ABNORMAL HIGH (ref 6–20)
CO2: 21 mmol/L — ABNORMAL LOW (ref 22–32)
Calcium: 8.3 mg/dL — ABNORMAL LOW (ref 8.9–10.3)
Chloride: 103 mmol/L (ref 98–111)
Creatinine, Ser: 6.63 mg/dL — ABNORMAL HIGH (ref 0.61–1.24)
GFR calc Af Amer: 10 mL/min — ABNORMAL LOW (ref >60)
GFR calc non Af Amer: 8 mL/min — ABNORMAL LOW (ref >60)
Glucose, Bld: 89 mg/dL (ref 70–99)
Potassium: 3.1 mmol/L — ABNORMAL LOW (ref 3.5–5.1)
Sodium: 137 mmol/L (ref 135–145)

## 2018-10-05 LAB — CBC WITH DIFFERENTIAL/PLATELET
Abs Immature Granulocytes: 0.04 10*3/uL (ref 0.00–0.07)
Basophils Absolute: 0.1 10*3/uL (ref 0.0–0.1)
Basophils Relative: 2 %
Eosinophils Absolute: 0.4 10*3/uL (ref 0.0–0.5)
Eosinophils Relative: 5 %
HCT: 24.3 % — ABNORMAL LOW (ref 39.0–52.0)
Hemoglobin: 7.8 g/dL — ABNORMAL LOW (ref 13.0–17.0)
Immature Granulocytes: 1 %
Lymphocytes Relative: 26 %
Lymphs Abs: 2.1 10*3/uL (ref 0.7–4.0)
MCH: 26.4 pg (ref 26.0–34.0)
MCHC: 32.1 g/dL (ref 30.0–36.0)
MCV: 82.1 fL (ref 80.0–100.0)
Monocytes Absolute: 1.2 10*3/uL — ABNORMAL HIGH (ref 0.1–1.0)
Monocytes Relative: 15 %
Neutro Abs: 4.3 10*3/uL (ref 1.7–7.7)
Neutrophils Relative %: 51 %
Platelets: 354 10*3/uL (ref 150–400)
RBC: 2.96 MIL/uL — ABNORMAL LOW (ref 4.22–5.81)
RDW: 15.9 % — ABNORMAL HIGH (ref 11.5–15.5)
Smear Review: NORMAL
WBC: 8.3 10*3/uL (ref 4.0–10.5)
nRBC: 0 % (ref 0.0–0.2)

## 2018-10-05 LAB — PROTIME-INR
INR: 2.3 — ABNORMAL HIGH (ref 0.8–1.2)
Prothrombin Time: 24.9 seconds — ABNORMAL HIGH (ref 11.4–15.2)

## 2018-10-05 LAB — URINALYSIS, ROUTINE W REFLEX MICROSCOPIC
Bacteria, UA: NONE SEEN
Bilirubin Urine: NEGATIVE
Glucose, UA: NEGATIVE mg/dL
Ketones, ur: NEGATIVE mg/dL
Leukocytes,Ua: NEGATIVE
Nitrite: NEGATIVE
Protein, ur: 30 mg/dL — AB
Specific Gravity, Urine: 1.006 (ref 1.005–1.030)
Squamous Epithelial / LPF: NONE SEEN (ref 0–5)
pH: 7 (ref 5.0–8.0)

## 2018-10-05 LAB — ACTH STIMULATION, 3 TIME POINTS
Cortisol, 30 Min: 5.3 ug/dL
Cortisol, 60 Min: 5.6 ug/dL
Cortisol, Base: 5.2 ug/dL

## 2018-10-05 LAB — QUANTIFERON-TB GOLD PLUS (RQFGPL)
QuantiFERON Mitogen Value: 4.05 IU/mL
QuantiFERON Nil Value: 0.07 IU/mL
QuantiFERON TB1 Ag Value: 0.07 IU/mL
QuantiFERON TB2 Ag Value: 0.07 IU/mL

## 2018-10-05 LAB — QUANTIFERON-TB GOLD PLUS: QuantiFERON-TB Gold Plus: NEGATIVE

## 2018-10-05 LAB — CK: Total CK: 92 U/L (ref 49–397)

## 2018-10-05 IMAGING — MR MRI HEAD WITHOUT CONTRAST
9 of 10 series · 43 of 48 positions shown · non-contrast
Comparison: CT head [DATE]

CLINICAL DATA: Altered level of consciousness.

EXAM:
MRI HEAD WITHOUT CONTRAST
TECHNIQUE: Multiplanar, multiecho pulse sequences of the brain and surrounding
structures were obtained without intravenous contrast.

[Series 2: ax dwi_tracew · axial · 3.0mm · 0.83mm/px · z∈[-63,+98]mm · 7 of 55 slices shown]
[im 1/55]
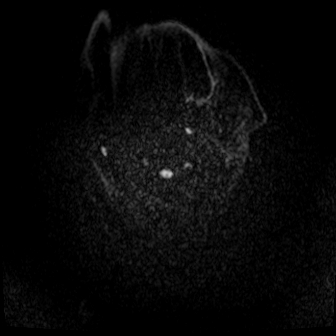
[im 10/55]
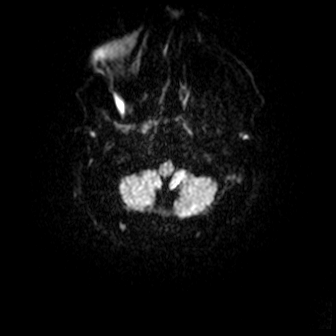
[im 19/55]
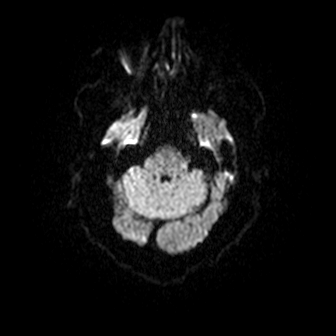
[im 28/55]
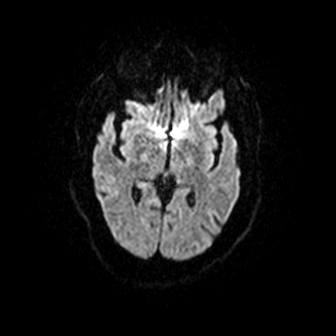
[im 37/55]
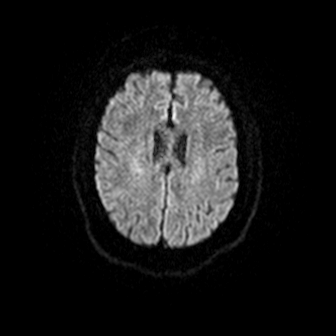
[im 46/55]
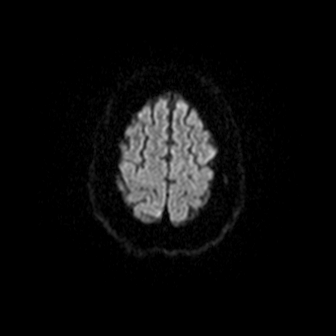
[im 55/55]
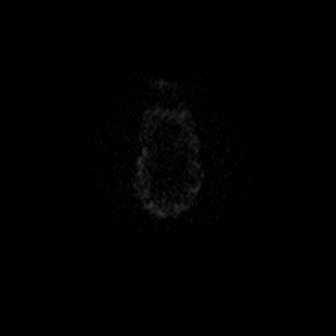

[Series 3: ax dwi_adc · axial · 3.0mm · 0.83mm/px · z∈[-63,+98]mm · 8 of 55 slices shown]
[im 1/55]
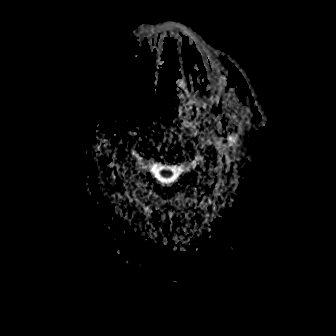
[im 8/55]
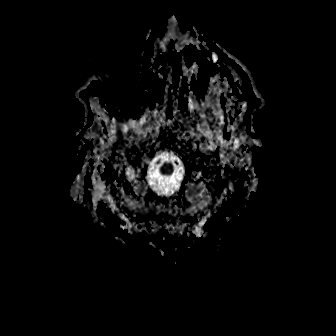
[im 16/55]
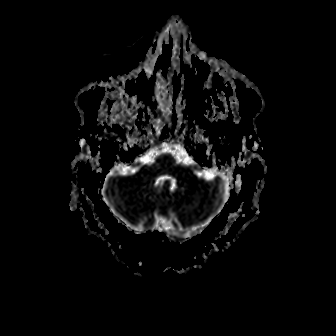
[im 24/55]
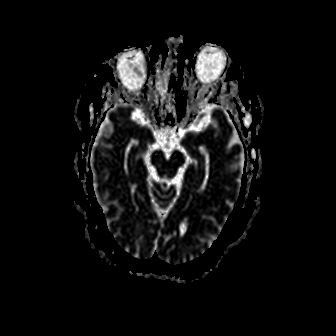
[im 31/55]
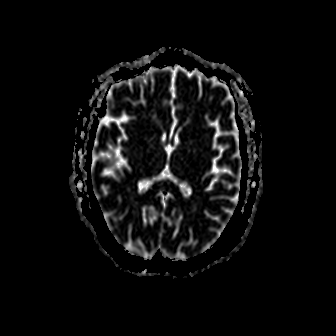
[im 39/55]
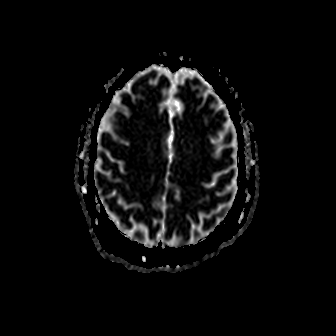
[im 47/55]
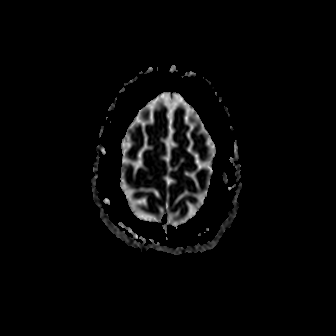
[im 55/55]
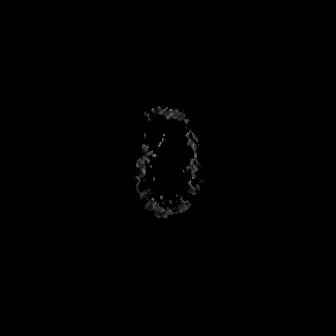

[Series 4: cor dwi_tracew · coronal · 5.0mm · 0.68mm/px · 5 of 39 slices shown]
[im 1/39]
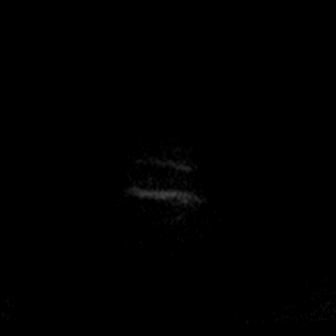
[im 10/39]
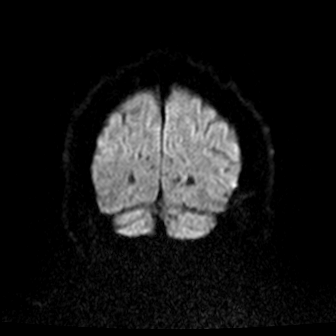
[im 20/39]
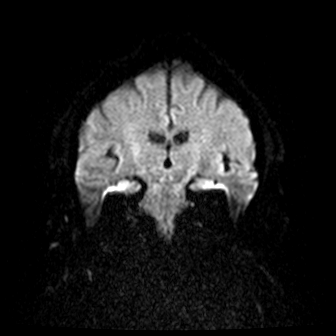
[im 29/39]
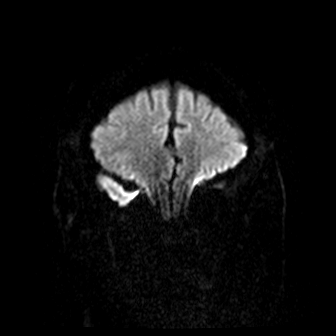
[im 39/39]
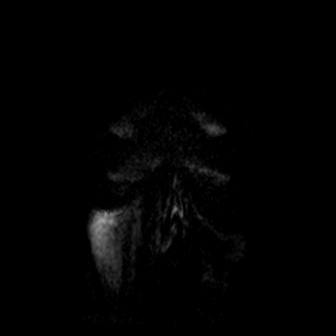

[Series 5: cor dwi_adc · coronal · 5.0mm · 0.68mm/px · 4 of 39 slices shown]
[im 1/39]
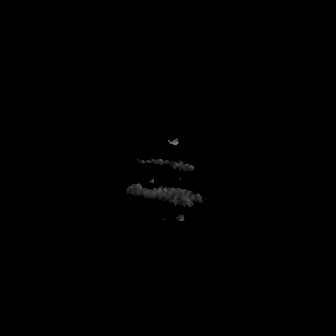
[im 10/39]
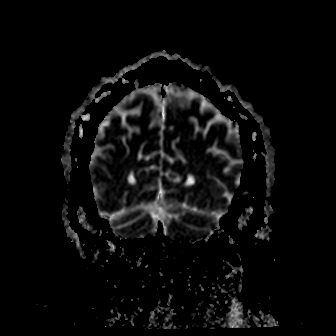
[im 20/39]
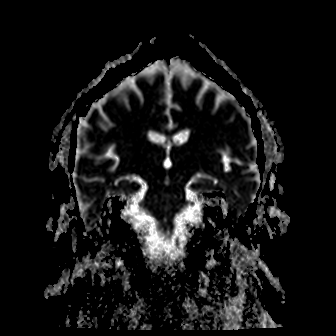
[im 29/39]
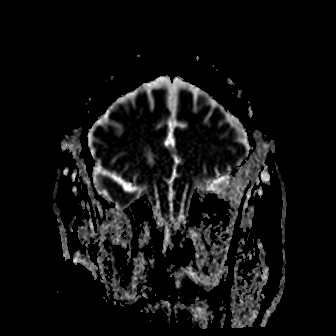

[Series 6: T1 · sagittal · 5.0mm · 0.94mm/px · 3 of 25 slices shown (1 of 2)]
[im 1/25]
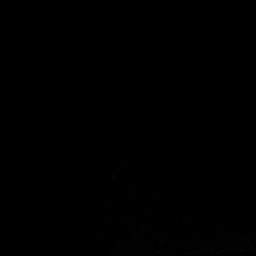
[im 13/25]
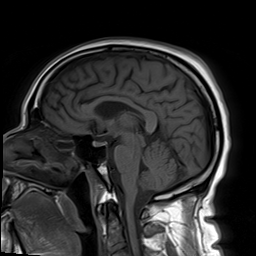
[im 25/25]
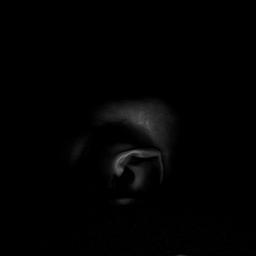

[Series 7: T2 · axial · 5.0mm · 0.45mm/px · z∈[-63,+91]mm · 4 of 27 slices shown (1 of 2)]
[im 1/27]
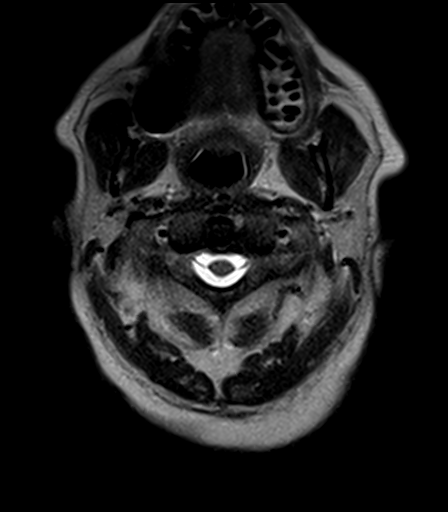
[im 9/27]
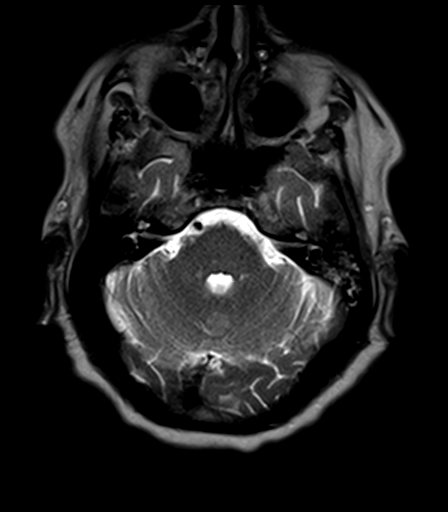
[im 18/27]
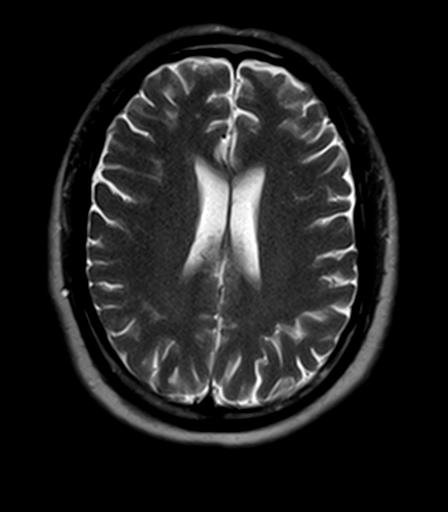
[im 27/27]
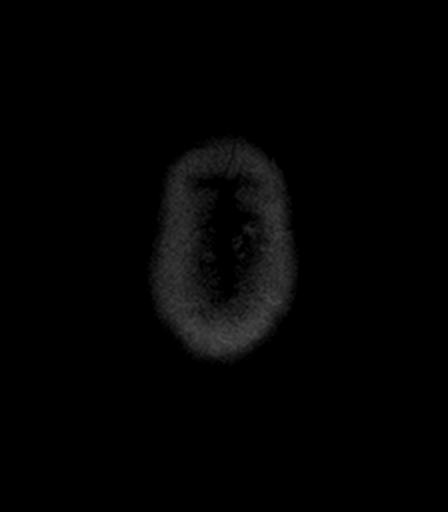

[Series 9: FLAIR · axial · 5.0mm · 1.20mm/px · z∈[-63,+92]mm · 4 of 27 slices shown]
[im 1/27]
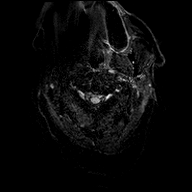
[im 9/27]
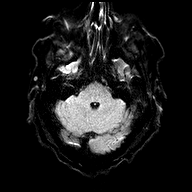
[im 18/27]
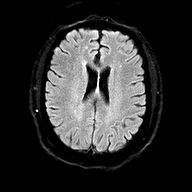
[im 27/27]
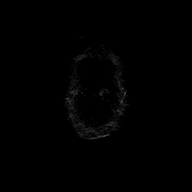

[Series 10: T1 · axial · 5.0mm · 0.90mm/px · z∈[-63,+91]mm · 4 of 27 slices shown (2 of 2)]
[im 1/27]
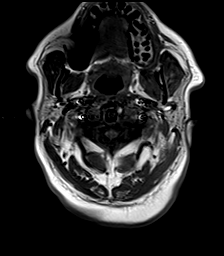
[im 9/27]
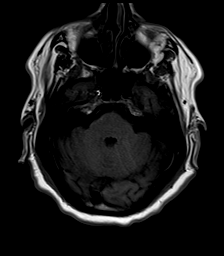
[im 18/27]
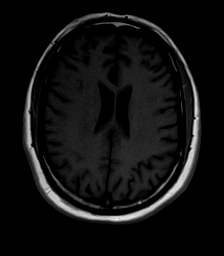
[im 27/27]
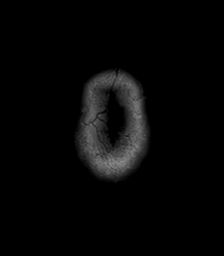

[Series 11: T2 · coronal · 5.0mm · 0.45mm/px · 4 of 31 slices shown (2 of 2)]
[im 1/31]
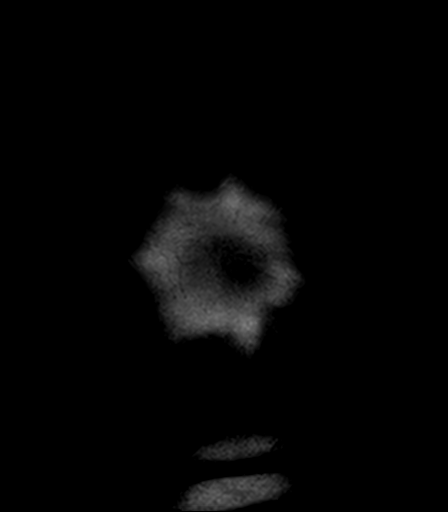
[im 11/31]
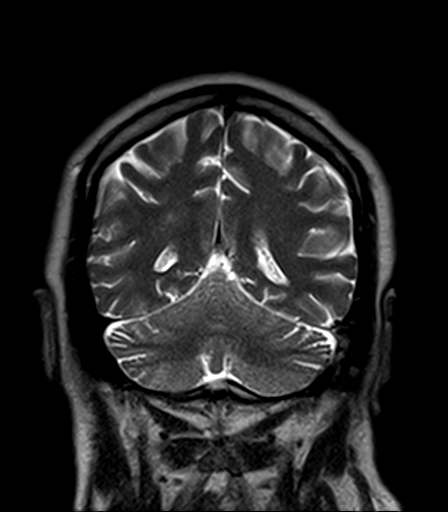
[im 21/31]
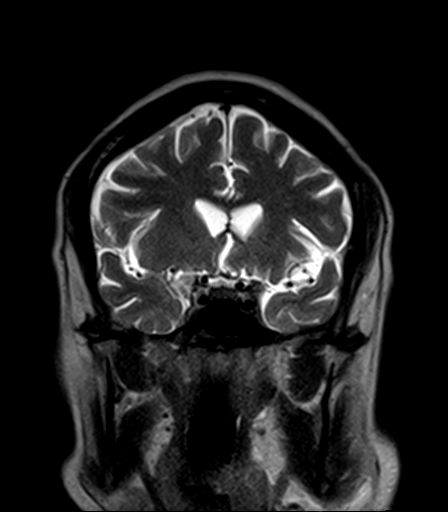
[im 31/31]
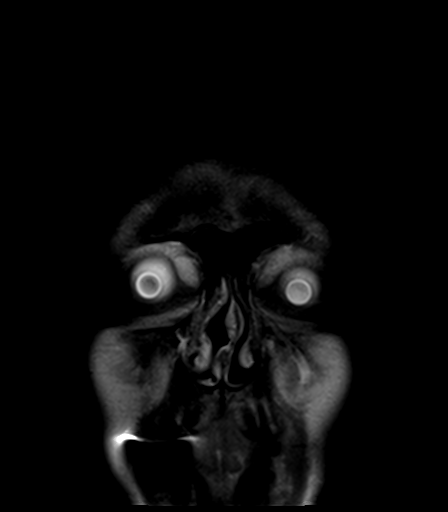

[43 of 48 positions shown; findings below may reference images not displayed]

FINDINGS: Brain: Ventricle size and cerebral volume normal. Negative for acute
infarct, hemorrhage, mass. Scattered small white matter
hyperintensities bilaterally likely due to microvascular ischemia.

Vascular: Normal arterial flow voids

Skull and upper cervical spine: Negative

Sinuses/Orbits: Mild mucosal edema paranasal sinuses. Left mastoid
effusion. Normal orbit.

Other: None
IMPRESSION: No acute abnormality. Mild chronic white matter changes likely due
to microvascular ischemia.

## 2018-10-05 MED ORDER — FLUDROCORTISONE ACETATE 0.1 MG PO TABS
0.1000 mg | ORAL_TABLET | Freq: Every day | ORAL | Status: DC
  Administered 2018-10-05 – 2018-10-11 (×6): 0.1 mg via ORAL
  Filled 2018-10-05 (×7): qty 1

## 2018-10-05 MED ORDER — HYDROCORTISONE 10 MG PO TABS
10.0000 mg | ORAL_TABLET | Freq: Two times a day (BID) | ORAL | Status: DC
  Administered 2018-10-05 – 2018-10-06 (×2): 10 mg via ORAL
  Filled 2018-10-05 (×4): qty 1

## 2018-10-05 MED ORDER — WARFARIN SODIUM 6 MG PO TABS
6.0000 mg | ORAL_TABLET | Freq: Once | ORAL | Status: AC
  Administered 2018-10-05: 6 mg via ORAL
  Filled 2018-10-05: qty 1

## 2018-10-05 MED ORDER — POTASSIUM CHLORIDE CRYS ER 20 MEQ PO TBCR
40.0000 meq | EXTENDED_RELEASE_TABLET | Freq: Once | ORAL | Status: AC
  Administered 2018-10-05: 40 meq via ORAL
  Filled 2018-10-05: qty 2

## 2018-10-05 MED ORDER — EPOETIN ALFA 10000 UNIT/ML IJ SOLN
10000.0000 [IU] | Freq: Once | INTRAMUSCULAR | Status: AC
  Administered 2018-10-05: 10000 [IU] via SUBCUTANEOUS
  Filled 2018-10-05: qty 1

## 2018-10-05 NOTE — TOC Progression Note (Addendum)
Transition of Care [facility]) - Progression Note    Patient Details  Name: Isaac Cross MRN: [id number] Date of Birth: [birth year]  Transition of Care [facility]) CM/SW Contact  Marquan Vokes, Lenice Llamas Phone Number: [phone]  10/05/2018, 9:30 AM  Clinical Narrative: Per admissions coordinator at [facility]) they can't accept IV dapto. Per Santiago Glad admissions coordinator at [facility] they can't accept patient. Clinical Education officer, museum (CSW) left a message at [facility]. Patient has no bed offers at this time.     Expected Discharge Plan: [place] Barriers to Discharge: Waiting for outpatient dialysis, Other (comment)(acute renal currently)  Expected Discharge Plan and Services Expected Discharge Plan: [place] In-house Referral: Clinical Social Work Discharge Planning Services: CM Consult Post Acute Care Choice: [place] arrangements for the past 2 months: [place]: (Patient stated that he is open to home health. ) Date [place]: 09/18/18 Time [place]: [place] Representative spoke with at [place]: [name]- unsure of when patient will discharge.   Social Determinants of Health (SDOH) Interventions    Readmission Risk Interventions No flowsheet data found.

## 2018-10-05 NOTE — Progress Notes (Signed)
[facility]  ROUNDING NOTE   Subjective:   Tmax 102.4  UOP 2500 Creatinine 6.63 (7.51)  LR at 159mL/hr  Cortisol level low three days consecutively.  Objective:  Vital signs in last 24 hours:  Temp:  [98.7 F (37.1 C)-102.4 F (39.1 C)] 98.7 F (37.1 C) (06/18 0758) Pulse Rate:  [92-97] 96 (06/18 0758) Resp:  [16-19] 16 (06/18 0758) BP: (114-143)/(68-83) 137/78 (06/18 0758) SpO2:  [96 %-100 %] 100 % (06/18 0758)  Weight change:  Filed Weights   09/29/18 0630 09/29/18 1445 09/29/18 1823  Weight: (!) 157.1 kg (!) 157 kg (!) 156 kg    Intake/Output: I/O last 3 completed shifts: In: 2178.3 [I.V.:1078.3; IV Piggyback:1100] Out: 3900 [Urine:3900]   Intake/Output this shift:  Total I/O In: 945.9 [I.V.:945.9] Out: 1700 [Urine:1700]  Physical Exam: General: NAD  Head: Moist oral mucosal membranes  Eyes: Anicteric   Lungs:  clear  Heart: regular  Abdomen:  Soft, nontender, obese   Extremities: + peripheral edema  Neurologic: Awake, alert, conversant  Skin: warm        Basic Metabolic Panel: Recent Labs  Lab 09/29/18 1657  10/01/18 0745 10/02/18 0506 10/03/18 0302 10/04/18 0542 10/05/18 0506  NA  --    < > 134* 137 138 138 137  K  --    < > 3.4* 3.4* 3.7 3.4* 3.1*  CL  --    < > 98 99 101 102 103  CO2  --    < > 25 24 24 22  21*  GLUCOSE  --    < > 94 106* 101* 94 89  BUN  --    < > 37* 36* 37* 37* 35*  CREATININE  --    < > 8.74* 8.63* 8.38* 7.51* 6.63*  CALCIUM  --    < > 7.9* 8.3* 8.5* 8.6* 8.3*  MG  --   --  1.7 1.9  --   --   --   PHOS 5.3*  --   --   --   --   --   --    < > = values in this interval not displayed.    Liver Function Tests: Recent Labs  Lab 09/29/18 1642  AST 38  ALT 22  ALKPHOS 58  BILITOT 0.7  PROT 5.6*  ALBUMIN 1.8*   No results for input(s): LIPASE, AMYLASE in the last 168 hours. No results for input(s): AMMONIA in the last 168 hours.  CBC: Recent Labs  Lab 10/01/18 0427 10/02/18 0506  10/03/18 0302 10/04/18 0542 10/05/18 0506  WBC 10.9* 10.3 10.4 10.2  10.4 8.3  NEUTROABS  --   --   --  5.7 4.3  HGB 8.5* 8.7* 8.8* 8.2*  8.2* 7.8*  HCT 27.0* 27.1* 27.8* 26.6*  25.9* 24.3*  MCV 84.1 81.6 82.0 85.0  82.5 82.1  PLT 499* 503* 494* 472*  450* 354    Cardiac Enzymes: Recent Labs  Lab 09/29/18 0504 10/02/18 0506 10/05/18 0506  CKTOTAL 89 114 92    BNP: Invalid input(s): POCBNP  CBG: Recent Labs  Lab 10/04/18 1357 10/04/18 1742 10/04/18 2100 10/05/18 0800 10/05/18 1229  GLUCAP 82 82 79 80 23    Microbiology: Results for orders placed or performed during the hospital encounter of 09/12/18  Blood Culture (routine x 2)     Status: None   Collection Time: 09/12/18  9:56 AM   Specimen: BLOOD LEFT HAND  Result Value Ref Range Status   Specimen Description  BLOOD LEFT HAND  Final   Special Requests   Final    BOTTLES DRAWN AEROBIC AND ANAEROBIC Blood Culture adequate volume   Culture   Final    NO GROWTH 5 DAYS Performed at [facility], [address] [place] , [place] [address]    Report Status 09/17/2018 FINAL  Final  Urine culture     Status: None   Collection Time: 09/12/18 10:01 AM   Specimen: Urine, Random  Result Value Ref Range Status   Specimen Description   Final    URINE, RANDOM Performed at [facility], [address], [place], [place] [address]    Special Requests   Final    NONE Performed at [facility], [address], [place], [place] [address]    Culture   Final    NO GROWTH Performed at [facility], [place] [address], [place], [place] [address]    Report Status 09/13/2018 FINAL  Final  SARS Coronavirus 2 (CEPHEID- Performed in [place] hospital lab), Hosp Order     Status: None   Collection Time: 09/12/18 10:02 AM   Specimen: Nasopharyngeal Swab  Result Value Ref Range Status   SARS Coronavirus 2 NEGATIVE NEGATIVE Final    Comment: (NOTE) If result is NEGATIVE SARS-CoV-2 target  nucleic acids are NOT DETECTED. The SARS-CoV-2 RNA is generally detectable in upper and lower  respiratory specimens during the acute phase of infection. The lowest  concentration of SARS-CoV-2 viral copies this assay can detect is 250  copies / mL. A negative result does not preclude SARS-CoV-2 infection  and should not be used as the sole basis for treatment or other  patient management decisions.  A negative result may occur with  improper specimen collection / handling, submission of specimen other  than nasopharyngeal swab, presence of viral mutation(s) within the  areas targeted by this assay, and inadequate number of viral copies  (<250 copies / mL). A negative result must be combined with clinical  observations, patient history, and epidemiological information. If result is POSITIVE SARS-CoV-2 target nucleic acids are DETECTED. The SARS-CoV-2 RNA is generally detectable in upper and lower  respiratory specimens dur ing the acute phase of infection.  Positive  results are indicative of active infection with SARS-CoV-2.  Clinical  correlation with patient history and other diagnostic information is  necessary to determine patient infection status.  Positive results do  not rule out bacterial infection or co-infection with other viruses. If result is PRESUMPTIVE POSTIVE SARS-CoV-2 nucleic acids MAY BE PRESENT.   A presumptive positive result was obtained on the submitted specimen  and confirmed on repeat testing.  While 2019 novel coronavirus  (SARS-CoV-2) nucleic acids may be present in the submitted sample  additional confirmatory testing may be necessary for epidemiological  and / or clinical management purposes  to differentiate between  SARS-CoV-2 and other Sarbecovirus currently known to infect humans.  If clinically indicated additional testing with an alternate test  methodology [phone]) is advised. The SARS-CoV-2 RNA is generally  detectable in upper and lower  respiratory sp ecimens during the acute  phase of infection. The expected result is Negative. Fact Sheet for Patients:  StrictlyIdeas.no Fact Sheet for Healthcare Providers: BankingDealers.co.za This test is not yet approved or cleared by the [place] FDA and has been authorized for detection and/or diagnosis of SARS-CoV-2 by FDA under an Emergency Use Authorization (EUA).  This EUA will remain in effect (meaning this test can be used) for  the duration of the COVID-19 declaration under Section 564(b)(1) of the Act, 21 U.S.C. section 360bbb-3(b)(1), unless the authorization is terminated or revoked sooner. Performed at [facility], [address] [place], [place] [address]   Blood Culture (routine x 2)     Status: None   Collection Time: 09/12/18 10:06 AM   Specimen: BLOOD  Result Value Ref Range Status   Specimen Description BLOOD LEFT ANTECUBITAL  Final   Special Requests   Final    BOTTLES DRAWN AEROBIC AND ANAEROBIC Blood Culture adequate volume   Culture   Final    NO GROWTH 5 DAYS Performed at [facility], [address] [place], [place] [address]    Report Status 09/17/2018 FINAL  Final  MRSA PCR Screening     Status: None   Collection Time: 09/12/18  7:04 PM   Specimen: Nasal Mucosa; Nasopharyngeal  Result Value Ref Range Status   MRSA by PCR NEGATIVE NEGATIVE Final    Comment:        The GeneXpert MRSA Assay (FDA approved for NASAL specimens only), is one component of a comprehensive MRSA colonization surveillance program. It is not intended to diagnose MRSA infection nor to guide or monitor treatment for MRSA infections. Performed at [facility], [address] [place], [place] [address]   C difficile quick scan w PCR reflex     Status: None   Collection Time: 09/14/18  2:21 PM   Specimen: STOOL  Result Value Ref Range Status   C Diff antigen NEGATIVE NEGATIVE Final   C  Diff toxin NEGATIVE NEGATIVE Final   C Diff interpretation No C. difficile detected.  Final    Comment: Performed at [facility], [address] [place], [place] [address]  Group A Strep by PCR     Status: None   Collection Time: 09/15/18  1:53 PM  Result Value Ref Range Status   Group A Strep by PCR NOT DETECTED NOT DETECTED Final    Comment: Performed at [facility], [address], [place], [place] [address]  Body fluid culture     Status: None   Collection Time: 09/15/18  5:13 PM   Specimen: KNEE  Result Value Ref Range Status   Specimen Description   Final    KNEE Performed at [facility], [address] [place], [place] [address]    Special Requests   Final    NONE Performed at [facility], [address] [place], [place] [address]    Gram Stain   Final    RARE RBCS FEW WBC SEEN NO ORGANISMS SEEN Performed at [facility], [address] [place], [place] [address]    Culture   Final    NO GROWTH 3 DAYS Performed at [facility], [place] [address], [place], [place] [address]    Report Status 09/19/2018 FINAL  Final  Body fluid culture     Status: None   Collection Time: 09/16/18  8:55 AM   Specimen: KNEE  Result Value Ref Range Status   Specimen Description KNEE RIGHT  Final   Special Requests NONE  Final   Gram Stain   Final    RARE WBC PRESENT, PREDOMINANTLY PMN NO ORGANISMS SEEN    Culture   Final    NO GROWTH 3 DAYS Performed at [facility], [place] [address] [place], [place] [address]    Report Status 09/20/2018 FINAL  Final  Aerobic/Anaerobic Culture (surgical/deep  wound)     Status: None   Collection Time: 09/17/18 11:32 AM   Specimen: Wound  Result Value Ref Range Status   Specimen Description   Final    TISSUE LEFT KNEE 1 Performed at [facility], [address] [place], [place] [address]    Special Requests PATIENT ON FOLLOWING MAXIPIME VANCOMYCIN  Final   Gram Stain    Final    FEW WBC PRESENT,BOTH PMN AND MONONUCLEAR NO ORGANISMS SEEN    Culture   Final    No growth aerobically or anaerobically. Performed at [facility], [place] [address], [place], [place] [address]    Report Status 09/22/2018 FINAL  Final  Acid Fast Smear (AFB)     Status: None   Collection Time: 09/17/18 11:32 AM   Specimen: Synovium  Result Value Ref Range Status   AFB Specimen Processing Comment  Final    Comment: Tissue Grinding and Digestion/Decontamination   Acid Fast Smear Negative  Final    Comment: (NOTE) Performed At: [facility] [place], [place] [id number] Rush Farmer MD EV:[id number]    Source (AFB) TISSUE  Final    Comment: LEFT KNEE Performed at [facility], [place] [address], [place], [place] [address]   Culture, fungus without smear     Status: None (Preliminary result)   Collection Time: 09/17/18 11:34 AM   Specimen: Synovium; Other  Result Value Ref Range Status   Specimen Description   Final    SYNOVIAL Performed at [facility], [address], [place], [place] [address]    Special Requests   Final    NONE Performed at [facility], [address], [place], [place] [address]    Culture   Final    NO FUNGUS ISOLATED AFTER 14 DAYS Performed at [facility], [place] [address] [place], [place] [address]    Report Status PENDING  Incomplete  Acid Fast Culture with reflexed sensitivities     Status: None   Collection Time: 09/17/18 11:36 AM   Specimen: Synovium  Result Value Ref Range Status   Acid Fast Culture CANC  Final    Comment: (NOTE) Test cancelled at client's request.      Cancelled per Levin Erp 09/21/2018 Performed At: [facility] [place], [place] [id number] Rush Farmer MD [id number]    Source of Sample TISSUE  Final    Comment: RIGHT KNEE Performed at [facility], [place] [address] [place], [place] [address]   Aerobic/Anaerobic  Culture (surgical/deep wound)     Status: None   Collection Time: 09/17/18 11:43 AM   Specimen: Wound  Result Value Ref Range Status   Specimen Description   Final    TISSUE LEFT KNEE 2 Performed at [facility], [address] [place], [place] [address]    Special Requests PATIENT ON FOLLOWING MAXIPIME VANCOMYCIN  Final   Gram Stain   Final    FEW WBC PRESENT, PREDOMINANTLY PMN NO ORGANISMS SEEN    Culture   Final    No growth aerobically or anaerobically. Performed at [facility], [place] [address], [place], [place] [address]    Report Status 09/22/2018 FINAL  Final  Aerobic/Anaerobic Culture (surgical/deep wound)     Status: None   Collection Time: 09/17/18 11:44 AM   Specimen: Wound  Result Value Ref Range Status   Specimen Description   Final    TISSUE LEFT KNEE 3  Performed at [facility], [address] [place], [place] [address]    Special Requests PATIENT ON FOLLOWING MAXIPIME VANCOMYCIN  Final   Gram Stain   Final    MODERATE WBC PRESENT, PREDOMINANTLY PMN NO ORGANISMS SEEN    Culture   Final    No growth aerobically or anaerobically. Performed at [facility], [place] [address] [place], [place] [address]    Report Status 09/22/2018 FINAL  Final  Aerobic/Anaerobic Culture (surgical/deep wound)     Status: None   Collection Time: 09/18/18  9:43 PM   Specimen: [facility] Other; Tissue  Result Value Ref Range Status   Specimen Description   Final    SYNOVIAL Performed at [facility], [address] [place], [place] [address]    Special Requests   Final    NONE Performed at [facility], [address] [place], [place] [address]    Gram Stain   Final    RARE WBC PRESENT, PREDOMINANTLY MONONUCLEAR NO ORGANISMS SEEN    Culture   Final    No growth aerobically or anaerobically. Performed at [facility], [place] [address], [place], [place] [address]    Report Status 09/24/2018 FINAL  Final  Aerobic/Anaerobic  Culture (surgical/deep wound)     Status: None   Collection Time: 09/18/18  9:44 PM   Specimen: [facility] Other; Tissue  Result Value Ref Range Status   Specimen Description   Final    SYNOVIAL Performed at [facility], [address], [place], [place] [address]    Special Requests   Final    RIGHT KNEE Performed at [facility], [place], [place] [address]    Gram Stain   Final    FEW WBC PRESENT,BOTH PMN AND MONONUCLEAR NO ORGANISMS SEEN    Culture   Final    No growth aerobically or anaerobically. Performed at [facility], [place] [address], [place], [place] [address]    Report Status 09/24/2018 FINAL  Final  Aerobic/Anaerobic Culture (surgical/deep wound)     Status: None   Collection Time: 09/18/18  9:45 PM   Specimen: [facility] Other; Tissue  Result Value Ref Range Status   Specimen Description   Final    SYNOVIAL Performed at [facility], [address] [place], [place] [address]    Special Requests   Final    RIGHT KNEE Performed at [facility], [place] [place], [place] [address]    Gram Stain   Final    FEW WBC PRESENT, PREDOMINANTLY MONONUCLEAR NO ORGANISMS SEEN    Culture   Final    No growth aerobically or anaerobically. Performed at [facility], [place] [address], [place], [place] [address]    Report Status 09/24/2018 FINAL  Final  Culture, blood (Routine X 2) w Reflex to ID Panel     Status: None   Collection Time: 09/19/18  1:11 PM   Specimen: BLOOD  Result Value Ref Range Status   Specimen Description BLOOD LEFT ANTECUBITAL  Final   Special Requests   Final    BOTTLES DRAWN AEROBIC AND ANAEROBIC Blood Culture results may not be optimal due to an excessive volume of blood received in culture bottles   Culture   Final    NO GROWTH 5 DAYS Performed at [facility], [address] [place],  [address]    Report Status 09/24/2018 FINAL  Final  Culture, blood (Routine  X 2)  w Reflex to ID Panel     Status: None   Collection Time: 09/19/18  1:20 PM   Specimen: BLOOD  Result Value Ref Range Status   Specimen Description BLOOD BLOOD RIGHT HAND  Final   Special Requests   Final    BOTTLES DRAWN AEROBIC AND ANAEROBIC Blood Culture results may not be optimal due to an excessive volume of blood received in culture bottles   Culture   Final    NO GROWTH 5 DAYS Performed at [facility], [address] [place], [place] [address]    Report Status 09/24/2018 FINAL  Final  Urine Culture     Status: None   Collection Time: 09/20/18  3:59 PM   Specimen: Urine, Random  Result Value Ref Range Status   Specimen Description   Final    URINE, RANDOM Performed at [facility], [address] [place], [place] [address]    Special Requests   Final    NONE Performed at [facility], [address], [place], [place] [address]    Culture   Final    NO GROWTH Performed at [facility], [place] [address] [place], [place] [address]    Report Status 09/21/2018 FINAL  Final  C difficile quick scan w PCR reflex     Status: None   Collection Time: 09/21/18  3:27 PM   Specimen: STOOL  Result Value Ref Range Status   C Diff antigen NEGATIVE NEGATIVE Final   C Diff toxin NEGATIVE NEGATIVE Final   C Diff interpretation No C. difficile detected.  Final    Comment: Performed at [facility], [place] [place], [place] [address]  Gastrointestinal Panel by PCR , Stool     Status: None   Collection Time: 09/21/18  3:27 PM   Specimen: Stool  Result Value Ref Range Status   Campylobacter species NOT DETECTED NOT DETECTED Final   Plesimonas shigelloides NOT DETECTED NOT DETECTED Final   Salmonella species NOT DETECTED NOT DETECTED Final   Yersinia enterocolitica NOT DETECTED NOT DETECTED Final   Vibrio species NOT DETECTED NOT DETECTED Final   Vibrio cholerae NOT DETECTED NOT DETECTED Final   Enteroaggregative E coli (EAEC) NOT  DETECTED NOT DETECTED Final   Enteropathogenic E coli (EPEC) NOT DETECTED NOT DETECTED Final   Enterotoxigenic E coli (ETEC) NOT DETECTED NOT DETECTED Final   Shiga like toxin producing E coli (STEC) NOT DETECTED NOT DETECTED Final   Shigella/Enteroinvasive E coli (EIEC) NOT DETECTED NOT DETECTED Final   Cryptosporidium NOT DETECTED NOT DETECTED Final   Cyclospora c[place]anensis NOT DETECTED NOT DETECTED Final   Entamoeba histolytica NOT DETECTED NOT DETECTED Final   Giardia lamblia NOT DETECTED NOT DETECTED Final   Adenovirus F40/41 NOT DETECTED NOT DETECTED Final   Astrovirus NOT DETECTED NOT DETECTED Final   Norovirus GI/GII NOT DETECTED NOT DETECTED Final   Rotavirus A NOT DETECTED NOT DETECTED Final   Sapovirus (I, II, IV, and V) NOT DETECTED NOT DETECTED Final    Comment: Performed at [facility], [address] [place], [place] [address]  Novel Coronavirus, NAA (hospital order; send-out to ref lab)     Status: None   Collection Time: 09/29/18  4:36 PM   Specimen: Nasopharyngeal Swab; Respiratory  Result Value Ref Range Status   SARS-CoV-2, NAA NOT DETECTED NOT DETECTED Final    Comment: (NOTE) This test was developed and its performance characteristics determined by [employer]. This  test has not been FDA cleared or approved. This test has been authorized by FDA under an Emergency Use Authorization (EUA). This test is only authorized for the duration of time the declaration that circumstances exist justifying the authorization of the emergency use of in vitro diagnostic tests for detection of SARS-CoV-2 virus and/or diagnosis of COVID-19 infection under section 564(b)(1) of the Act, 21 U.S.C. 161WRU-0(A)(5), unless the authorization is terminated or revoked sooner. When diagnostic testing is negative, the possibility of a false negative result should be considered in the context of a patient's recent exposures and the presence of clinical signs and symptoms  consistent with COVID-19. An individual without symptoms of COVID-19 and who is not shedding SARS-CoV-2 virus would expect to have a negative (not detected) result in this assay. Performed  At: [facility] [address] [place], [place] [id number] Rush Farmer MD NW:[id number]    [place]  Final    Comment: Performed at [facility], [address] [place], [place] [address]  CULTURE, BLOOD (ROUTINE X 2) w Reflex to ID Panel     Status: None (Preliminary result)   Collection Time: 10/02/18  5:26 PM   Specimen: BLOOD  Result Value Ref Range Status   Specimen Description BLOOD BLOOD RIGHT FOREARM  Final   Special Requests   Final    BOTTLES DRAWN AEROBIC AND ANAEROBIC Blood Culture adequate volume   Culture   Final    NO GROWTH 3 DAYS Performed at [facility], [address] [place], [place] [address]    Report Status PENDING  Incomplete  CULTURE, BLOOD (ROUTINE X 2) w Reflex to ID Panel     Status: None (Preliminary result)   Collection Time: 10/02/18  5:41 PM   Specimen: BLOOD  Result Value Ref Range Status   Specimen Description BLOOD BLOOD RIGHT HAND  Final   Special Requests   Final    BOTTLES DRAWN AEROBIC AND ANAEROBIC Blood Culture adequate volume   Culture   Final    NO GROWTH 3 DAYS Performed at [facility], [address], [place], [place] [address]    Report Status PENDING  Incomplete    Coagulation Studies: Recent Labs    10/03/18 0302 10/04/18 0542 10/05/18 0506  LABPROT 30.2* 22.2* 24.9*  INR 2.9* 2.0* 2.3*    Urinalysis: Recent Labs    10/05/18 1025  COLORURINE STRAW*  LABSPEC 1.006  PHURINE 7.0  GLUCOSEU NEGATIVE  HGBUR SMALL*  BILIRUBINUR NEGATIVE  KETONESUR NEGATIVE  PROTEINUR 30*  NITRITE NEGATIVE  LEUKOCYTESUR NEGATIVE      Imaging: Ct Abdomen Pelvis Wo Contrast  Result Date: 10/04/2018 CLINICAL DATA:  Fever of unknown origin. EXAM: CT CHEST, ABDOMEN AND PELVIS  WITHOUT CONTRAST TECHNIQUE: Multidetector CT imaging of the chest, abdomen and pelvis was performed following the standard protocol without IV contrast. COMPARISON:  Abdomen pelvis CT 04/30/2015 FINDINGS: CT CHEST FINDINGS Cardiovascular: The heart size is normal. No substantial pericardial effusion. Coronary artery calcification is evident. No thoracic aortic aneurysm. Mediastinum/Nodes: No mediastinal lymphadenopathy. No evidence for gross hilar lymphadenopathy although assessment is limited by the lack of intravenous contrast on today's study. The esophagus has normal imaging features. There is no axillary lymphadenopathy. Lungs/Pleura: Patchy areas of ground-glass attenuation are identified in the lungs bilaterally. Subsegmental atelectasis noted in the dependent lower lobes bilaterally. No dense focal airspace consolidation. No pleural effusion. Musculoskeletal: No worrisome lytic or sclerotic osseous abnormality. CT ABDOMEN PELVIS FINDINGS Hepatobiliary: The liver shows diffusely decreased  attenuation suggesting steatosis. There is no evidence for gallstones, gallbladder wall thickening, or pericholecystic fluid. No intrahepatic or extrahepatic biliary dilation. Pancreas: No focal mass lesion. No dilatation of the main duct. No intraparenchymal cyst. No peripancreatic edema. Spleen: No splenomegaly. No focal mass lesion. Adrenals/Urinary Tract: 5.2 x 2.6 cm left adrenal mass is new since a chest CT of 07/28/2017. This mass has attenuation too high to allow classification as an adenoma. 3.2 cm low-density lesion in the interpolar right kidney approaches water density and is unchanged in the interval, likely a cyst. Similar exophytic water density lesion in the lower pole right kidney measures 5 cm and is likely a cyst. No evidence for hydroureter. Urinary bladder wall is mildly thickened and ill-defined. Tiny gas bubble in the bladder lumen noted. 4.1 x 2.3 cm nodule in the right adrenal gland is also new in  the interval. This has low attenuation suggesting that it represents an adenoma. Stomach/Bowel: Stomach is unremarkable. No gastric wall thickening. No evidence of outlet obstruction. Duodenum is normally positioned as is the ligament of Treitz. No small bowel wall thickening. No small bowel dilatation. The terminal ileum is normal. No gross colonic mass. No colonic wall thickening. Diverticuli are seen scattered along the entire length of the colon without CT findings of diverticulitis. Vascular/Lymphatic: There is abdominal aortic atherosclerosis without aneurysm. There is no gastrohepatic or hepatoduodenal ligament lymphadenopathy. No intraperitoneal or retroperitoneal lymphadenopathy. Upper normal to borderline enlarged lymph nodes are seen in the external iliac pelvic sidewall bilaterally. Upper normal groin nodes are evident bilaterally. Reproductive: The prostate gland and seminal vesicles are unremarkable. Other: No intraperitoneal free fluid. Musculoskeletal: No worrisome lytic or sclerotic osseous abnormality. Small bilateral groin hernias contain only fat. IMPRESSION: 1. Mild circumferential bladder wall thickening with ill-defined bladder wall thickening. Infection/inflammation would be a consideration. Tiny gas bubble in the bladder lumen is presumably related to recent instrumentation although bladder infection could have this appearance. 2. New 4.1 x 2.3 cm right adrenal nodule with low attenuation most suggestive of benign adrenal adenoma. 5.2 x 2.6 cm left adrenal also mass new since 07/28/2017. This has attenuation too high to allow classification as an adenoma on this noncontrast CT. Follow-up CT could be used to ensure stability or MRI of the abdomen without and with contrast could be used to further evaluate for lipid poor adenoma. 3. Patchy areas of ground-glass attenuation in both lungs. Imaging features are nonspecific and may be related to an infectious or inflammatory alveolitis. 4. Upper  normal to borderline enlarged lymph nodes in the external iliac chains bilaterally, nonspecific but potentially reactive. 5. Hepatic steatosis. 6. Bilateral renal cysts. Electronically Signed   By: Misty Stanley M.D.   On: 10/04/2018 17:51   Ct Head Wo Contrast  Result Date: 10/04/2018 CLINICAL DATA:  Dizziness and weakness.  Fall. EXAM: CT HEAD WITHOUT CONTRAST TECHNIQUE: Contiguous axial images were obtained from the base of the skull through the vertex without intravenous contrast. COMPARISON:  Head CT 09/14/2018 FINDINGS: Brain: There is no mass, hemorrhage or extra-axial collection. The size and configuration of the ventricles and extra-axial CSF spaces are normal. The brain parenchyma is normal, without acute or chronic infarction. Vascular: No abnormal hyperdensity of the major intracranial arteries or dural venous sinuses. No intracranial atherosclerosis. Skull: The visualized skull base, calvarium and extracranial soft tissues are normal. Sinuses/Orbits: Small amount of fluid at the left mastoid tip. No middle ear effusion. Paranasal sinuses are clear. The orbits are normal. IMPRESSION: Normal brain. Electronically Signed  By: Ulyses Jarred M.D.   On: 10/04/2018 15:04   Ct Chest Wo Contrast  Result Date: 10/04/2018 CLINICAL DATA:  Fever of unknown origin. EXAM: CT CHEST, ABDOMEN AND PELVIS WITHOUT CONTRAST TECHNIQUE: Multidetector CT imaging of the chest, abdomen and pelvis was performed following the standard protocol without IV contrast. COMPARISON:  Abdomen pelvis CT 04/30/2015 FINDINGS: CT CHEST FINDINGS Cardiovascular: The heart size is normal. No substantial pericardial effusion. Coronary artery calcification is evident. No thoracic aortic aneurysm. Mediastinum/Nodes: No mediastinal lymphadenopathy. No evidence for gross hilar lymphadenopathy although assessment is limited by the lack of intravenous contrast on today's study. The esophagus has normal imaging features. There is no axillary  lymphadenopathy. Lungs/Pleura: Patchy areas of ground-glass attenuation are identified in the lungs bilaterally. Subsegmental atelectasis noted in the dependent lower lobes bilaterally. No dense focal airspace consolidation. No pleural effusion. Musculoskeletal: No worrisome lytic or sclerotic osseous abnormality. CT ABDOMEN PELVIS FINDINGS Hepatobiliary: The liver shows diffusely decreased attenuation suggesting steatosis. There is no evidence for gallstones, gallbladder wall thickening, or pericholecystic fluid. No intrahepatic or extrahepatic biliary dilation. Pancreas: No focal mass lesion. No dilatation of the main duct. No intraparenchymal cyst. No peripancreatic edema. Spleen: No splenomegaly. No focal mass lesion. Adrenals/Urinary Tract: 5.2 x 2.6 cm left adrenal mass is new since a chest CT of 07/28/2017. This mass has attenuation too high to allow classification as an adenoma. 3.2 cm low-density lesion in the interpolar right kidney approaches water density and is unchanged in the interval, likely a cyst. Similar exophytic water density lesion in the lower pole right kidney measures 5 cm and is likely a cyst. No evidence for hydroureter. Urinary bladder wall is mildly thickened and ill-defined. Tiny gas bubble in the bladder lumen noted. 4.1 x 2.3 cm nodule in the right adrenal gland is also new in the interval. This has low attenuation suggesting that it represents an adenoma. Stomach/Bowel: Stomach is unremarkable. No gastric wall thickening. No evidence of outlet obstruction. Duodenum is normally positioned as is the ligament of Treitz. No small bowel wall thickening. No small bowel dilatation. The terminal ileum is normal. No gross colonic mass. No colonic wall thickening. Diverticuli are seen scattered along the entire length of the colon without CT findings of diverticulitis. Vascular/Lymphatic: There is abdominal aortic atherosclerosis without aneurysm. There is no gastrohepatic or hepatoduodenal  ligament lymphadenopathy. No intraperitoneal or retroperitoneal lymphadenopathy. Upper normal to borderline enlarged lymph nodes are seen in the external iliac pelvic sidewall bilaterally. Upper normal groin nodes are evident bilaterally. Reproductive: The prostate gland and seminal vesicles are unremarkable. Other: No intraperitoneal free fluid. Musculoskeletal: No worrisome lytic or sclerotic osseous abnormality. Small bilateral groin hernias contain only fat. IMPRESSION: 1. Mild circumferential bladder wall thickening with ill-defined bladder wall thickening. Infection/inflammation would be a consideration. Tiny gas bubble in the bladder lumen is presumably related to recent instrumentation although bladder infection could have this appearance. 2. New 4.1 x 2.3 cm right adrenal nodule with low attenuation most suggestive of benign adrenal adenoma. 5.2 x 2.6 cm left adrenal also mass new since 07/28/2017. This has attenuation too high to allow classification as an adenoma on this noncontrast CT. Follow-up CT could be used to ensure stability or MRI of the abdomen without and with contrast could be used to further evaluate for lipid poor adenoma. 3. Patchy areas of ground-glass attenuation in both lungs. Imaging features are nonspecific and may be related to an infectious or inflammatory alveolitis. 4. Upper normal to borderline enlarged lymph nodes in  the external iliac chains bilaterally, nonspecific but potentially reactive. 5. Hepatic steatosis. 6. Bilateral renal cysts. Electronically Signed   By: Misty Stanley M.D.   On: 10/04/2018 17:51   Mr Brain Wo Contrast  Result Date: 10/05/2018 CLINICAL DATA:  Altered level of consciousness. EXAM: MRI HEAD WITHOUT CONTRAST TECHNIQUE: Multiplanar, multiecho pulse sequences of the brain and surrounding structures were obtained without intravenous contrast. COMPARISON:  CT head 10/04/2018 FINDINGS: Brain: Ventricle size and cerebral volume normal. Negative for acute  infarct, hemorrhage, mass. Scattered small white matter hyperintensities bilaterally likely due to microvascular ischemia. Vascular: Normal arterial flow voids Skull and upper cervical spine: Negative Sinuses/Orbits: Mild mucosal edema paranasal sinuses. Left mastoid effusion. Normal orbit. Other: None IMPRESSION: No acute abnormality. Mild chronic white matter changes likely due to microvascular ischemia. Electronically Signed   By: Franchot Gallo M.D.   On: 10/05/2018 14:36     Medications:   . sodium chloride 5 mL/hr at 09/29/18 0242  . sodium chloride 0 mL/hr at 09/30/18 1534  . anticoagulant sodium citrate    . DAPTOmycin (CUBICIN)  IV Stopped (10/03/18 2102)  . lactated ringers Stopped (10/05/18 1140)  . levofloxacin (LEVAQUIN) IV 500 mg (10/04/18 1754)   . aspirin EC  81 mg Oral Daily  . Chlorhexidine Gluconate Cloth  6 each Topical Q0600  . feeding supplement (NEPRO CARB STEADY)  237 mL Oral BID BM  . fludrocortisone  0.1 mg Oral Daily  . fluticasone furoate-vilanterol  1 puff Inhalation Daily  . folic acid  1 mg Oral Daily  . insulin aspart  0-5 Units Subcutaneous QHS  . insulin aspart  0-9 Units Subcutaneous TID WC  . mouth rinse  15 mL Mouth Rinse BID  . multivitamin with minerals  1 tablet Oral Daily  . omega-3 acid ethyl esters  1 g Oral Daily  . potassium chloride  40 mEq Oral Once  . sodium chloride flush  3 mL Intravenous Q12H  . warfarin  6 mg Oral ONCE-1800  . Warfarin - Pharmacist Dosing Inpatient   Does not apply q1800   sodium chloride, sodium chloride, acetaminophen **OR** acetaminophen, alum & mag hydroxide-simeth, anticoagulant sodium citrate, HYDROcodone-acetaminophen, iohexol, ipratropium-albuterol, menthol-cetylpyridinium **OR** phenol, ondansetron **OR** ondansetron (ZOFRAN) IV, sodium chloride flush  Assessment/ Plan:  [name] is a 57 y.o. black male with hyeprtension, COPD, diabetes mellitus type II who underwent bilateral knee replacement  surgeries last week. who was admitted to [facility] on 09/12/2018 for sepsis  1. Acute renal failure with metabolic acidosis  baseline creatinine of 1.07 with normal GFR in 06/12/18.  Likely severe ATN from hypotension and sepsis No indication for dialysis today. Polyuria could be post ATN diuresis - Started on IV fluids  2. Septic shock: bilateral knee replacement on Sep 01, 2018 at [facility] septic arthritis, patient underwent I&D and polyethylene exchange on 5/31 and Rt knee on 6/1 - ID recommends 4 weeks of ceftazidime and daptomycin (6mg /kg) until 10/31/2018  3. Hypotension and Hypokalemia: with concern of adrenal insufficiency.  - Will need TTKG as an outpatient - discontinued atenolol - Start fludrocortisone - Replace potassium   4. Anemia with renal failure: hemoglobin 7.8 - Order EPO subcu 10000 units x 1 today.    LOS: [place] 6/18/20203:11 PM

## 2018-10-05 NOTE — Progress Notes (Addendum)
Physical Therapy Treatment Patient Details Name: Isaac Cross MRN: [id number] DOB: 04/09/1961 Today's Date: 10/05/2018    History of Present Illness Pt is a 58 y.o. male presenting to hospital 09/12/18 after rolling OOB landing on L knee; pt with increased dizziness x2 days, fevers x3 days, and generalized weakness.  S/p B TKR 09/01/18 in [place] (pt reports discharging home same day).  Pt admitted with sepsis, acute renal failure, and B TKR.  Pt s/p L knee 5/31 and R knee 6/1 I&D with poly exchange.  Pt noted with severe encephalopathy from DT's with extensive ETOH abuse.  L LE DVT noted.  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  HD started 6/5 with L IJ temporary HD catheter.  PMH includes B TKR 09/01/18 in [place], DM, asthma, R vein surgery, COPD. Right IJ line removed 6/16. 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    PT Comments    Pt in bed, awake upon entry, agreeable to participate. Pt slightly more conversational this date, but when asked to recount a single memory from prior day, preferably from an event that occurred with Pryor Curia, he is unable to do so, which is surprising as Chief Strategy Officer spent over an hour with the patient yesterday, including 16 minutes while getting patient off floor. Pt knees are clearly more painful, stiff, and limited this date. Details are unclear, but based on how patient was found in floor, it seems impossible that he did not fall onto his knees momentarily during that transition. This date knees appears newly dressed, no overt bruising. Continued with bed level exercises, then assured TKE positioning at end of session with breakfast presented. Pt does offer some verbal confirmation of pain this date which is either a testament to improved cognition or elevated pain. Will continue to follow.     Follow Up Recommendations  CIR     Equipment Recommendations  3in1 (PT);Other (comment)    Recommendations for Other Services OT consult      Precautions / Restrictions Precautions Precautions: Fall Precaution Comments: Pt has been cognitively impaired with poor awareness of this, not very vocal about dizziness, pain, need ot rest, etc. Restrictions RLE Weight Bearing: Weight bearing as tolerated LLE Weight Bearing: Weight bearing as tolerated    Mobility  Bed Mobility Overal bed mobility: (deferred this session; Still awaiting updated notes from attending, 1d s/p fall to floor.)                Transfers                    Ambulation/Gait                 Stairs             Wheelchair Mobility    Modified Rankin (Stroke Patients Only)       Balance                                            Cognition Arousal/Alertness: Awake/alert Behavior During Therapy: Flat affect Overall Cognitive Status: Impaired/Different from baseline Area of Impairment: Memory;Following commands;Awareness;Attention;Problem solving;Orientation                 Orientation Level: Person;Place;Time Current Attention Level: Selective Memory: Decreased short-term memory(Pt unable to recall any event from prior day to author) Following Commands: Follows one step commands inconsistently(improved  inititation this date, with no clear delay)     Problem Solving: Slow processing;Decreased initiation;Requires verbal cues;Difficulty sequencing        Exercises Total Joint Exercises Short Arc Quad: Strengthening;Both;Supine;15 reps;AAROM;Limitations Short Arc Quad Limitations: MinA requires for performance, improved activation from prior days Heel Slides: AAROM;Strengthening;Both;Supine;Limitations;10 reps Heel Slides Limitations: More limited by pain this date, likely realted to fall yesterday. Knees free of any bruising, dressings intact. has grimacing and pre-crying wth end range flexion, esp Left Hip ABduction/ADduction: AAROM;Strengthening;Both;Supine;15 reps;Limitations Hip  Abduction/Adduction Limitations: MinA requires to perform.    General Comments        Pertinent Vitals/Pain Pain Assessment: Faces Faces Pain Scale: Hurts even more Pain Location: Bilat knees, R>L Pain Descriptors / Indicators: Sore;Discomfort;Grimacing;Crying Pain Intervention(s): Limited activity within patient's tolerance;Monitored during session;Premedicated before session;Ice applied    Home Living                      Prior Function            PT Goals (current goals can now be found in the care plan section) Acute Rehab PT Goals Patient Stated Goal: to go home PT Goal Formulation: With patient Time For Goal Achievement: 10/13/18 Potential to Achieve Goals: Fair Progress towards PT goals: Not progressing toward goals - comment(awaiting recent workup, labs, MD notes prior tto OOB)    Frequency    7X/week      PT Plan Current plan remains appropriate    Co-evaluation              AM-PAC PT "6 Clicks" Mobility   Outcome Measure  Help needed turning from your back to your side while in a flat bed without using bedrails?: A Little Help needed moving from lying on your back to sitting on the side of a flat bed without using bedrails?: A Little Help needed moving to and from a bed to a chair (including a wheelchair)?: A Little Help needed standing up from a chair using your arms (e.g., wheelchair or bedside chair)?: A Little Help needed to walk in hospital room?: A Lot Help needed climbing 3-5 steps with a railing? : Total 6 Click Score: 15    End of Session   Activity Tolerance: Patient tolerated treatment well;No increased pain Patient left: with call bell/phone within reach;in bed;with bed alarm set;with family/visitor present Nurse Communication: Mobility status PT Visit Diagnosis: Other abnormalities of gait and mobility (R26.89);Muscle weakness (generalized) (M62.81);Difficulty in walking, not elsewhere classified (R26.2);Pain Pain -  Right/Left: Left Pain - part of body: Knee     Time: 1007-1219 PT Time Calculation (min) (ACUTE ONLY): 28 min  Charges:  $Therapeutic Exercise: 23-37 mins                     12:38 PM, 10/05/18 Etta Grandchild, PT, DPT Physical Therapist - [facility]  [phone] ([place])    Cassoday C 10/05/2018, 12:33 PM

## 2018-10-05 NOTE — Progress Notes (Signed)
Orthostatic vitals established during PT/OT co-treat session. Pt is orthostatic and symptomatic. RN made aware.     2:01 PM, 10/05/18 Etta Grandchild, PT, DPT Physical Therapist - [facility]  [phone] [facility])

## 2018-10-05 NOTE — Progress Notes (Signed)
Pt refuses Bipap

## 2018-10-05 NOTE — Progress Notes (Signed)
   10/05/18 1400  Clinical Encounter Type  Visited With Patient  Visit Type Follow-up  Referral From Social work   Chaplain followed up with the patient after referral from Social Work on behalf of the patient's wife. Upon arrival, the patient was sitting up in bed and eating lunch. He appeared to be in a lighter mood than our last conversation on 6/15, as evidenced by his smile and affirmation that he was "doing better today." The patient confirmed that he has been in touch with his wife via phone and video; he said he'll need to ask his kids for some help with that. Chaplain will return to engage patient in deeper conversation at a later time to allow time for him to enjoy his meal. Patient expressed gratitude for the visit.

## 2018-10-05 NOTE — Consult Note (Signed)
[facility] Consult Follow-up  Reason for Consult: Cognitive impairment, moca score 7 out of 30 Per physical therapy Referring Physician: Dr. 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)   Chronic obstructive asthma ([place])   Depression with anxiety   Acute renal failure ([place])   Septic shock ([place])  Patient seen, chart is reviewed. Total Time spent with patient: 35 minutes  Subjective: " I am still not sure about my plan for discharge.  I hope it is not so much longer that I will be able to go home."  HPI:   Isaac Cross is a 58 y.o. male patient   admitted on 09/12/2018 with a known history of arthritis, COPD not on home oxygen, hypertension, sleep apnea, non-insulin-dependent diabetes mellitus presents to hospital secondary to dizziness, weakness and a fall this morning. Patient had bilateral knee replacement surgeries done about 9 days ago.  He did well for the first week.  Over the last 3 to 4 days he has been having fevers and chills.  Initially had increased frequency of urination that has decreased the last 3 days.  His urine output has been decreased.  He has been feeling dizzy and lightheaded.  Has been nauseous and had one episode of vomiting.  Denies any abdominal pain or diarrhea.  Denies any exposure to sick contacts.  His COVID-19 test is negative here.  Denies any cough or respiratory symptoms or chest pain.  He states that he fell out of bed onto his knees this morning and hurt both his knees.  After the surgery he has been getting home health physical therapy and was able to walk using his walker. In the ED his creatinine is increased from 1 at baseline to 3.7 today.  He received 4.5 L of fluids in the emergency room and his urine output after a Foley catheter was placed here  was only 75 cc.  He is hyponatremic, acidotic.  Urine analysis showing possible infection.  Has lactic acidosis with elevated WBC and procalcitonin.  He is being admitted for sepsis.  Psychiatry consultation is requested for fluctuations in cognition. Record review does indicate that patient has had delirium while hospitalized.  Past Psychiatric History: Anxiety and depression; likely adjustment disorder due to bilateral knee replacement surgeries followed by acute sepsis; delirium per medical records.  On evaluation this morning, patient is calm and cooperative.  He is alert and oriented x4.  He is situationally aware of hospitalization, personal events/illness and world events.  Patient expresses some frustration regarding his prolonged hospitalization.  He specifically however denies any suicidal ideation, plan or intent stating, "things will never get that bad.  My family depends on me whether I have knees or not."  Patient denies any HI.  He denies any AVH.  Patient expresses that his treatment team is starting to coordinate his discharge planning.  He remains hopeful that he will continue to improve.  Support and encouragement is provided.  Risk to Self:  None Risk to Others:  None Prior Inpatient Therapy:  None Prior Outpatient Therapy:  None  Past Medical History:  Past Medical History:  Diagnosis Date  . Allergy   . Anxiety   . Arthritis    knees  . Asthma, mild intermittent, well-controlled   . Chronic obstructive asthma ([place])   . COPD (chronic obstructive  pulmonary disease) ([place])   . Depression with anxiety   . Diabetes ([place])   . GERD (gastroesophageal reflux disease)   . HBP (high blood pressure)   . High cholesterol   . Renal cyst, right   . Sleep apnea    CPAP  . Swelling     Past Surgical History:  Procedure Laterality Date  . BLADDER REPAIR    . COLONOSCOPY WITH PROPOFOL N/A 04/07/2015   Procedure: COLONOSCOPY WITH PROPOFOL;  Surgeon: Lucilla Lame, MD;  Location:  [facility];  Service: Endoscopy;  Laterality: N/A;  Diabetic - oral meds CPAP  . HAND SURGERY     4TH AND 5TH FINGER  . I&D KNEE WITH POLY EXCHANGE Right 09/18/2018   Procedure: IRRIGATION AND DEBRIDEMENT KNEE WITH POLY EXCHANGE;  Surgeon: Lovell Sheehan, MD;  Location: [facility];  Service: Orthopedics;  Laterality: Right;  . KNEE ARTHROSCOPY Right   . 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 TOTAL KNEE ARTHROPLASTY Left 09/17/2018   Procedure: I &D left knee and polyethylene exchange;  Surgeon: Lovell Sheehan, MD;  Location: [facility];  Service: Orthopedics;  Laterality: Left;  Marland Kitchen VEIN SURGERY Right    Family History:  Family History  Problem Relation Age of Onset  . Hypertension Mother   . Diabetes Mother   . Diabetes Father   . Hypertension Father   . Cancer Father   . Seizures Daughter   . Lupus Daughter   . Cancer Maternal Grandfather        Prostate  . Diabetes Paternal Grandfather   . Prostate cancer Maternal Uncle   . Bladder Cancer Neg Hx   . Kidney cancer Neg Hx    Family Psychiatric  History: Denies  Social History:  Social History   Substance and Sexual Activity  Alcohol Use Yes  . Alcohol/week: 10.0 standard drinks  . Types: 10 Cans of beer per week   Comment: occasional     Social History   Substance and Sexual Activity  Drug Use No    Social History   Socioeconomic History  . Marital status: Married    Spouse name: Not on file  . Number of children: 2  . Years of education: Not on file  . Highest education level: Bachelor's degree (e.g., BA, AB, BS)  Occupational History  . Occupation: case Freight forwarder   Social Needs  . Financial resource strain: Not hard at all  . Food insecurity    Worry: Never true    Inability: Never true  . Transportation needs    Medical: No    Non-medical: No  Tobacco Use  . Smoking status: Former Smoker    Packs/day: 1.00    Years: 10.00    Pack years: 10.00    Types: Cigarettes    Start  date: 04/20/1995    Quit date: 04/19/2005    Years since quitting: 13.4  . Smokeless tobacco: Never Used  Substance and Sexual Activity  . Alcohol use: Yes    Alcohol/week: 10.0 standard drinks    Types: 10 Cans of beer per week    Comment: occasional  . Drug use: No  . Sexual activity: Yes    Partners: Female  Lifestyle  . Physical activity    Days per week: 0 days    Minutes per session: 0 min  . Stress: Not at all  Relationships  . Social connections    Talks on phone: More than three times a week  Gets together: Once a week    Attends religious service: Never    Active member of club or organization: No    Attends meetings of clubs or organizations: Never    Relationship status: Married  Other Topics Concern  . Not on file  Social History Narrative   Lives at home with wife, using a walker since his surgery recently.   Additional Social History:  Patient is married to his current wife, [name] of 93 years.  He has 2 children ages 33 and 49 from his previous wife. 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    Allergies:   Allergies  Allergen Reactions  . 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 Lipitor [Atorvastatin] Hives and Itching  . Other   . Viagra  [Sildenafil Citrate]     vision loss    Labs:  Results for orders placed or performed during the hospital encounter of 09/12/18 (from the past 48 hour(s))  Protime-INR     Status: Abnormal   Collection Time: 10/04/18  5:42 AM  Result Value Ref Range   Prothrombin Time 22.2 (H) 11.4 - 15.2 seconds   INR 2.0 (H) 0.8 - 1.2    Comment: (NOTE) INR goal varies based on device and disease states. Performed at [facility], [address] [place], [place] [address]   Basic metabolic panel     Status: Abnormal   Collection Time: 10/04/18  5:42 AM  Result Value Ref Range   Sodium 138 135 - 145 mmol/L    Potassium 3.4 (L) 3.5 - 5.1 mmol/L   Chloride 102 98 - 111 mmol/L   CO2 22 22 - 32 mmol/L   Glucose, Bld 94 70 - 99 mg/dL   BUN 37 (H) 6 - 20 mg/dL   Creatinine, Ser 7.51 (H) 0.61 - 1.24 mg/dL   Calcium 8.6 (L) 8.9 - 10.3 mg/dL   GFR calc non Af Amer 7 (L) >60 mL/min   GFR calc Af Amer 8 (L) >60 mL/min   Anion gap 14 5 - 15    Comment: Performed at [facility], [address] [place], [place] [address]  CBC     Status: Abnormal   Collection Time: 10/04/18  5:42 AM  Result Value Ref Range   WBC 10.4 4.0 - 10.5 K/uL   RBC 3.14 (L) 4.22 - 5.81 MIL/uL   Hemoglobin 8.2 (L) 13.0 - 17.0 g/dL   HCT 25.9 (L) 39.0 - 52.0 %   MCV 82.5 80.0 - 100.0 fL   MCH 26.1 26.0 - 34.0 pg   MCHC 31.7 30.0 - 36.0 g/dL   RDW 15.8 (H) 11.5 - 15.5 %   Platelets 450 (H) 150 - 400 K/uL   nRBC 0.0 0.0 - 0.2 %    Comment: Performed at [facility], [address] [place], [place] [address]  Cortisol-am, blood     Status: Abnormal   Collection Time: 10/04/18  5:42 AM  Result Value Ref Range   Cortisol - AM 4.6 (L) 6.7 - 22.6 ug/dL    Comment: Performed at [facility] [address], [place], [place] [address]  Sedimentation rate     Status: Abnormal   Collection Time: 10/04/18  5:42 AM  Result Value Ref Range   Sed Rate 127 (H) 0 - 20 mm/hr    Comment: Performed at  [facility], [address] [place], [place] [address]  TSH     Status: None   Collection Time: 10/04/18  5:42 AM  Result Value Ref Range   TSH 2.775 0.350 - 4.500 uIU/mL    Comment: Performed by a 3rd Generation assay with a functional sensitivity of <=0.01 uIU/mL. Performed at [facility], [address] [place], [place] [address]   CBC with Differential/Platelet     Status: Abnormal   Collection Time: 10/04/18  5:42 AM  Result Value Ref Range   WBC 10.2 4.0 - 10.5 K/uL   RBC 3.13 (L) 4.22 - 5.81 MIL/uL   Hemoglobin 8.2 (L) 13.0 - 17.0 g/dL   HCT 26.6 (L) 39.0 - 52.0 %   MCV 85.0  80.0 - 100.0 fL   MCH 26.2 26.0 - 34.0 pg   MCHC 30.8 30.0 - 36.0 g/dL   RDW 16.0 (H) 11.5 - 15.5 %   Platelets 472 (H) 150 - 400 K/uL   nRBC 0.0 0.0 - 0.2 %   Neutrophils Relative % 57 %   Neutro Abs 5.7 1.7 - 7.7 K/uL   Lymphocytes Relative 23 %   Lymphs Abs 2.3 0.7 - 4.0 K/uL   Monocytes Relative 16 %   Monocytes Absolute 1.7 (H) 0.1 - 1.0 K/uL   Eosinophils Relative 3 %   Eosinophils Absolute 0.3 0.0 - 0.5 K/uL   Basophils Relative 1 %   Basophils Absolute 0.1 0.0 - 0.1 K/uL   Immature Granulocytes 0 %   Abs Immature Granulocytes 0.04 0.00 - 0.07 K/uL    Comment: Performed at [facility], [address] [place], [place] [address]  Glucose, capillary     Status: None   Collection Time: 10/04/18  8:47 AM  Result Value Ref Range   Glucose-Capillary 78 70 - 99 mg/dL   Comment 1 Notify RN    Comment 2 Document in Chart   Glucose, capillary     Status: None   Collection Time: 10/04/18 11:37 AM  Result Value Ref Range   Glucose-Capillary 86 70 - 99 mg/dL   Comment 1 Notify RN    Comment 2 Document in Chart   Glucose, capillary     Status: None   Collection Time: 10/04/18  1:57 PM  Result Value Ref Range   Glucose-Capillary 82 70 - 99 mg/dL   Comment 1 Notify RN    Comment 2 Document in Chart   ACTH     Status: Abnormal   Collection Time: 10/04/18  3:16 PM  Result Value Ref Range   C206 ACTH 445.0 (H) 7.2 - 63.3 pg/mL    Comment: (NOTE) ACTH reference interval for samples collected between 7 and 10 AM. Performed At: [facility] [place], [place] [id number] Rush Farmer MD [id number]   Glucose, capillary     Status: None   Collection Time: 10/04/18  5:42 PM  Result Value Ref Range   Glucose-Capillary 82 70 - 99 mg/dL   Comment 1 Notify RN    Comment 2 Document in Chart   Glucose, capillary     Status: None   Collection Time: 10/04/18  9:00 PM  Result Value Ref Range   Glucose-Capillary 79 70 - 99 mg/dL  Protime-INR      Status: Abnormal   Collection Time: 10/05/18  5:06 AM  Result Value Ref Range   Prothrombin Time 24.9 (H) 11.4 - 15.2 seconds   INR 2.3 (H) 0.8 - 1.2  Comment: (NOTE) INR goal varies based on device and disease states. Performed at [facility], [address] [place], [place] [address]   CK     Status: None   Collection Time: 10/05/18  5:06 AM  Result Value Ref Range   Total CK 92 49 - 397 U/L    Comment: Performed at [facility], [address] [place], [place] [address]  Basic metabolic panel     Status: Abnormal   Collection Time: 10/05/18  5:06 AM  Result Value Ref Range   Sodium 137 135 - 145 mmol/L   Potassium 3.1 (L) 3.5 - 5.1 mmol/L   Chloride 103 98 - 111 mmol/L   CO2 21 (L) 22 - 32 mmol/L   Glucose, Bld 89 70 - 99 mg/dL   BUN 35 (H) 6 - 20 mg/dL   Creatinine, Ser 6.63 (H) 0.61 - 1.24 mg/dL   Calcium 8.3 (L) 8.9 - 10.3 mg/dL   GFR calc non Af Amer 8 (L) >60 mL/min   GFR calc Af Amer 10 (L) >60 mL/min   Anion gap 13 5 - 15    Comment: Performed at [facility], [address] [place], [place] [address]  CBC with Differential/Platelet     Status: Abnormal   Collection Time: 10/05/18  5:06 AM  Result Value Ref Range   WBC 8.3 4.0 - 10.5 K/uL   RBC 2.96 (L) 4.22 - 5.81 MIL/uL   Hemoglobin 7.8 (L) 13.0 - 17.0 g/dL   HCT 24.3 (L) 39.0 - 52.0 %   MCV 82.1 80.0 - 100.0 fL   MCH 26.4 26.0 - 34.0 pg   MCHC 32.1 30.0 - 36.0 g/dL   RDW 15.9 (H) 11.5 - 15.5 %   Platelets 354 150 - 400 K/uL   nRBC 0.0 0.0 - 0.2 %   Neutrophils Relative % 51 %   Neutro Abs 4.3 1.7 - 7.7 K/uL   Lymphocytes Relative 26 %   Lymphs Abs 2.1 0.7 - 4.0 K/uL   Monocytes Relative 15 %   Monocytes Absolute 1.2 (H) 0.1 - 1.0 K/uL   Eosinophils Relative 5 %   Eosinophils Absolute 0.4 0.0 - 0.5 K/uL   Basophils Relative 2 %   Basophils Absolute 0.1 0.0 - 0.1 K/uL   WBC Morphology MORPHOLOGY UNREMARKABLE    RBC Morphology MORPHOLOGY UNREMARKABLE    Smear Review  Normal platelet morphology    Immature Granulocytes 1 %   Abs Immature Granulocytes 0.04 0.00 - 0.07 K/uL    Comment: Performed at [facility], [address] [place], [place] [address]  ACTH stimulation, 3 time points     Status: None   Collection Time: 10/05/18  7:10 AM  Result Value Ref Range   Cortisol, Base 5.2 ug/dL    Comment: NO NORMAL RANGE ESTABLISHED FOR THIS TEST   Cortisol, 30 Min 5.3 ug/dL   Cortisol, 60 Min 5.6 ug/dL    Comment: Performed at [facility], [place] [address], [place], [place] [address]  Glucose, capillary     Status: None   Collection Time: 10/05/18  8:00 AM  Result Value Ref Range   Glucose-Capillary 80 70 - 99 mg/dL   Comment 1 Notify RN   Urinalysis, Routine w reflex microscopic     Status: Abnormal   Collection Time: 10/05/18 10:25 AM  Result Value Ref Range   Color, Urine STRAW (A) YELLOW   APPearance CLEAR (A) CLEAR   Specific Gravity, Urine  1.006 1.005 - 1.030   pH 7.0 5.0 - 8.0   Glucose, UA NEGATIVE NEGATIVE mg/dL   Hgb urine dipstick SMALL (A) NEGATIVE   Bilirubin Urine NEGATIVE NEGATIVE   Ketones, ur NEGATIVE NEGATIVE mg/dL   Protein, ur 30 (A) NEGATIVE mg/dL   Nitrite NEGATIVE NEGATIVE   Leukocytes,Ua NEGATIVE NEGATIVE   RBC / HPF 0-5 0 - 5 RBC/hpf   WBC, UA 0-5 0 - 5 WBC/hpf   Bacteria, UA NONE SEEN NONE SEEN   Squamous Epithelial / LPF NONE SEEN 0 - 5   Mucus PRESENT     Comment: Performed at [facility], [address] [place], [place] [address]  Glucose, capillary     Status: None   Collection Time: 10/05/18 12:29 PM  Result Value Ref Range   Glucose-Capillary 98 70 - 99 mg/dL   Comment 1 Notify RN   Glucose, capillary     Status: Abnormal   Collection Time: 10/05/18  4:37 PM  Result Value Ref Range   Glucose-Capillary 108 (H) 70 - 99 mg/dL   Comment 1 Notify RN     Current Facility-Administered Medications  Medication Dose Route Frequency Provider Last Rate Last Dose  . 0.9 %  sodium chloride  infusion   Intravenous PRN Flora Lipps, MD 5 mL/hr at 09/29/18 0242    . 0.9 %  sodium chloride infusion  250 mL Intravenous PRN Flora Lipps, MD 0 mL/hr at 09/30/18 1534    . acetaminophen (TYLENOL) tablet 650 mg  650 mg Oral Q6H PRN Lovell Sheehan, MD   650 mg at 10/05/18 1613   Or  . acetaminophen (TYLENOL) suppository 650 mg  650 mg Rectal Q6H PRN Lovell Sheehan, MD   650 mg at 09/20/18 0444  . alum & mag hydroxide-simeth (MAALOX/MYLANTA) 200-200-20 MG/5ML suspension 30 mL  30 mL Oral Q4H PRN Fritzi Mandes, MD      . anticoagulant sodium citrate solution 5 mL  5 mL Intravenous Q dialysis Lateef, Munso[place], MD   5 mL at 09/29/18 1837  . aspirin EC tablet 81 mg  81 mg Oral Daily Lovell Sheehan, MD   81 mg at 10/05/18 1021  . Chlorhexidine Gluconate Cloth 2 % PADS 6 each  6 each Topical Q0600 Lovell Sheehan, MD   6 each at 10/02/18 [phone]  . DAPTOmycin (CUBICIN) 700 mg in sodium chloride 0.9 % IVPB  700 mg Intravenous Q48H [place], RPH   Stopped at 10/03/18 2102  . feeding supplement (NEPRO CARB STEADY) liquid 237 mL  237 mL Oral BID BM Flora Lipps, MD 237 mL/hr at 09/25/18 0951 237 mL at 10/05/18 1607  . fludrocortisone (FLORINEF) tablet 0.1 mg  0.1 mg Oral Daily Sudini, Alveta Heimlich, MD   0.1 mg at 10/05/18 1607  . fluticasone furoate-vilanterol (BREO ELLIPTA) 100-25 MCG/INH 1 puff  1 puff Inhalation Daily Fritzi Mandes, MD   1 puff at 10/05/18 1024  . folic acid (FOLVITE) tablet 1 mg  1 mg Oral Daily Fritzi Mandes, MD   1 mg at 10/05/18 1021  . HYDROcodone-acetaminophen (NORCO/VICODIN) 5-325 MG per tablet 1-2 tablet  1-2 tablet Oral Q4H PRN Lovell Sheehan, MD   2 tablet at 09/19/18 0450  . hydrocortisone (CORTEF) tablet 10 mg  10 mg Oral BID Sudini, Alveta Heimlich, MD      . insulin aspart (novoLOG) injection 0-5 Units  0-5 Units Subcutaneous QHS Fritzi Mandes, MD      . insulin aspart (novoLOG) injection 0-9  Units  0-9 Units Subcutaneous TID WC Fritzi Mandes, MD   1 Units at 10/03/18 1248  . iohexol  (OMNIPAQUE) 240 MG/ML injection 50 mL  50 mL Oral Once PRN Awilda Bill, NP      . ipratropium-albuterol (DUONEB) 0.5-2.5 (3) MG/3ML nebulizer solution 3 mL  3 mL Nebulization Q6H PRN Flora Lipps, MD   3 mL at 09/25/18 0721  . lactated ringers infusion   Intravenous Continuous Hillary Bow, MD   Stopped at 10/05/18 1140  . levofloxacin (LEVAQUIN) IVPB 500 mg  500 mg Intravenous Q48H Ravishankar, Joellyn Quails, MD 100 mL/hr at 10/04/18 1754 500 mg at 10/04/18 1754  . MEDLINE mouth rinse  15 mL Mouth Rinse BID Flora Lipps, MD   15 mL at 10/05/18 1021  . menthol-cetylpyridinium (CEPACOL) lozenge 3 mg  1 lozenge Oral PRN Lovell Sheehan, MD       Or  . phenol (CHLORASEPTIC) mouth spray 1 spray  1 spray Mouth/Throat PRN Lovell Sheehan, MD      . multivitamin with minerals tablet 1 tablet  1 tablet Oral Daily Fritzi Mandes, MD   1 tablet at 10/05/18 1021  . omega-3 acid ethyl esters (LOVAZA) capsule 1 g  1 g Oral Daily Fritzi Mandes, MD   1 g at 10/05/18 1021  . ondansetron (ZOFRAN) tablet 4 mg  4 mg Oral Q6H PRN Lovell Sheehan, MD       Or  . ondansetron [facility]) injection 4 mg  4 mg Intravenous Q6H PRN Lovell Sheehan, MD      . sodium chloride flush (NS) 0.9 % injection 3 mL  3 mL Intravenous Q12H Flora Lipps, MD   3 mL at 10/04/18 2319  . sodium chloride flush (NS) 0.9 % injection 3 mL  3 mL Intravenous PRN Flora Lipps, MD   3 mL at 09/24/18 0805  . Warfarin - Pharmacist Dosing Inpatient   Does not apply [id number] Fritzi Mandes, MD        Musculoskeletal: Strength & Muscle Tone: decreased Gait & Station: unsteady Patient leans: N/A  Psychiatric Specialty Exam: Physical Exam  Nursing note and vitals reviewed. Constitutional: He is oriented to person, place, and time. He appears well-developed and well-nourished. No distress.  HENT:  Head: Normocephalic and atraumatic.  Eyes: EOM are normal.  Neck: Normal range of motion.  Cardiovascular: Normal rate and regular rhythm.  Respiratory: Effort  normal. No respiratory distress.  Musculoskeletal: Normal range of motion.  Neurological: He is alert and oriented to person, place, and time.    Review of Systems  Constitutional: Negative.   Respiratory: Negative.   Cardiovascular: Negative.   Gastrointestinal: Negative.   Musculoskeletal: Negative.   Neurological: Negative.     Blood pressure (!) 141/89, pulse 96, temperature 100.1 F (37.8 C), resp. rate 17, height 6\' 4"  (1.93 m), weight (!) 156 kg, SpO2 100 %.Body mass index is 41.86 kg/m.  General Appearance: Casual and Neat  Eye Contact:  Good  Speech:  Clear and Coherent and Normal Rate  Volume:  Normal  Mood:  Dysphoric  Affect:  Appropriate  Thought Process:  Coherent, Linear and Descriptions of Associations: Intact  Orientation:  Full (Time, Place, and Person)  Thought Content:  Logical and Hallucinations: None  Suicidal Thoughts:  No  Homicidal Thoughts:  No  Memory:  Good  Judgement:  Fair  Insight:  Fair  Psychomotor Activity:  Decreased  Concentration:  Concentration: Good and Attention Span: Good  Recall:  Fair  Fund of Knowledge:  Good  Language:  Good  Akathisia:  No  Handed:  Right  AIMS (if indicated):     Assets:  Communication Skills Desire for Improvement Financial Resources/Insurance Housing Intimacy Social Support Talents/Skills Vocational/Educational  ADL's:  Impaired  Cognition:  WNL  Sleep:   Patient has obstructive sleep apnea, uses BiPAP at night     Treatment Plan Summary: Daily contact with patient to assess and evaluate symptoms and progress in treatment and Medication management  Continue patient's Celexa 20 mg daily for anxiety and depression.  Patient likely has ongoing delirium which presents as fluctuating cognition.  At time of this assessment patient has full capacity.  The patient's exam is notable for altered sensorium, perceptual disturbances, disorientation and cognitive deficits that appear markedly different than  their baseline, suggesting a diagnosis of delirium.  Virtually any medical condition or physiologic stress can precipitate delirium in a susceptible individual, with risk increasing in those with: advanced age, sensory impairments, organic brain disease (stroke, dementia, Parkinsons), psychiatric illness, major chronic medical issues, prolonged hospitalizations, postoperative status, anemia, insomnia/disturbed sleep, and severe pain. Addressing the underlying medical condition and institution of preventative measures are recommended.  - Continue to monitor and treat underlying medical causes of delirium, including infection, electrolyte disturbances, etc. - Delirium precautions - Minimize/avoid deliriogenic meds including: anticholinergic, opiates, benzodiazepines           - Maintain hydration, oxygenation, nutrition           - Limit use of restraints and catheters           - Normalize sleep patterns by minimizing nighttime noise, light and interruptions by     -Ensure sleep apnea treatment is provided overnight.             clustering care, opening blinds during the day           - Reorient the patient frequently, provide easily visible clock and calendar           - Provide sensory aids like glasses, hearing aids           - Encourage ambulation, regular activities and visitors to maintain cognitive stimulation   -Patient would benefit from having family members at bedside to reinforce his orientation.  Disposition: No evidence of imminent risk to self or others at present.   Patient does not meet criteria for psychiatric inpatient admission. Supportive therapy provided about ongoing stressors.  Lavella Hammock, MD 10/05/2018 8:28 PM

## 2018-10-05 NOTE — Progress Notes (Signed)
Physical Therapy Contact Note   Per attending physician and unit director, patient's wife, Divit Stipp, approved for one-time visit for training/participation with therapy session on 10/06/18, 1030.  Wife to arrive through medical mall-aware of screening process, mandatory masking and restrictions for single visitor, single session and presence during therapy session only.  Patient/wife informed and aware of one-time visit for training purposes and coordination of care.   Jru Pense H. Owens Shark, PT, DPT, NCS 10/05/18, 9:14 AM [phone]

## 2018-10-05 NOTE — Progress Notes (Signed)
Date of Admission:  09/12/2018     Admitted with fever and swelling of legs /knee on 09/12/18 with AKI following b/l TKA on 09/01/18 hospital course complicated by hypotension, septic shock, worsening AKI/ Alcohol withdrawal with delirium needing sedation. 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 Also had washouts both knees Cultures are all negative ( but patient was on bactrim/Keflex before he came to the hospital) Subjective: More awake and alert and responding appropriately Says he had urethral stricture in the 1980 which was dilated once.No problem since then he says  Medications:   aspirin EC  81 mg Oral Daily   Chlorhexidine Gluconate Cloth  6 each Topical Q0600   feeding supplement (NEPRO CARB STEADY)  237 mL Oral BID BM   fludrocortisone  0.1 mg Oral Daily   fluticasone furoate-vilanterol  1 puff Inhalation Daily   folic acid  1 mg Oral Daily   hydrocortisone  10 mg Oral BID   insulin aspart  0-5 Units Subcutaneous QHS   insulin aspart  0-9 Units Subcutaneous TID WC   mouth rinse  15 mL Mouth Rinse BID   multivitamin with minerals  1 tablet Oral Daily   omega-3 acid ethyl esters  1 g Oral Daily   sodium chloride flush  3 mL Intravenous Q12H   Warfarin - Pharmacist Dosing Inpatient   Does not apply q1800    Objective: Vital signs in last 24 hours: Temp:  [98.7 F (37.1 C)-100.1 F (37.8 C)] 100.1 F (37.8 C) (06/18 1607) Pulse Rate:  [92-96] 96 (06/18 1607) Resp:  [16-18] 17 (06/18 1607) BP: (134-143)/(78-89) 141/89 (06/18 1607) SpO2:  [97 %-100 %] 100 % (06/18 1607)  PHYSICAL EXAM:  General: awake Alert, cooperative, no distress, appears stated age.  Head: Normocephalic, without obvious abnormality, atraumatic. Eyes: Conjunctivae clear, anicteric sclerae. Pupils are equal ENT bridge of the nose has an eschar Lips, mucosa, and tongue normal. No Thrush Neck: Supple, symmetrical, no adenopathy, thyroid: non tender no carotid bruit and no  JVD. Lungs: Cb/l air entry. Heart: s1s2 Abdomen: Soft,  Extremities:b/l leg edema Skin: No rashes or lesions. Or bruising Lymph: Cervical, supraclavicular normal. Neurologic: Grossly non-focal  Lab Results Recent Labs    10/04/18 0542 10/05/18 0506  WBC 10.2   10.4 8.3  HGB 8.2*   8.2* 7.8*  HCT 26.6*   25.9* 24.3*  NA 138 137  K 3.4* 3.1*  CL 102 103  CO2 22 21*  BUN 37* 35*  CREATININE 7.51* 6.63*   Liver Panel No results for input(s): PROT, ALBUMIN, AST, ALT, ALKPHOS, BILITOT, BILIDIR, IBILI in the last 72 hours. Sedimentation Rate Recent Labs    10/04/18 0542  ESRSEDRATE 127*   Immunology and other work up for fever ANA 09/12/18-& 09/19/18 =Neg ACE level 09/19/18 NEg Complement c4/C3 Neg 09/28/18 ANCA neg 09/28/18 Beta D glucan 09/20/18-Neg CK 09/13/18 was 4516 CK 10/02/18 is 114 TSH 5.055 on 09/15/18 Cortisol on 09/20/18 was 18.4 ( 4 pm) Quantiferon Gold on 09/21/18 indeterminate QG from 10/03/18 Negative Hepatitis C from 09/21/18 NR Hepatitis A and B neg  HIV NR 09/20/18   ACTH level ( 3pm) is 445 ( normal uto 63) Microbiology: [facility] 09/12/18-NG 09/19/18 BC NG 6/3 fungal culturebloodneg  Synovial fluidculture left5/29/20 Neg Synovial fluid rt knee NG from 09/16/18 Synovial tissue cultureleft knee5/31NG NGS sent to [place] ( 16s RNA) neg AFB/FUNGAL from left synovial tissue negative from 09/17/18 so far  6/16 Blood  Culture neg so far  Studies/Results: Ct  Abdomen Pelvis Wo Contrast  Result Date: 10/04/2018 CLINICAL DATA:  Fever of unknown origin. EXAM: CT CHEST, ABDOMEN AND PELVIS WITHOUT CONTRAST TECHNIQUE: Multidetector CT imaging of the chest, abdomen and pelvis was performed following the standard protocol without IV contrast. COMPARISON:  Abdomen pelvis CT 04/30/2015 FINDINGS: CT CHEST FINDINGS Cardiovascular: The heart size is normal. No substantial pericardial effusion. Coronary artery calcification is evident. No thoracic aortic  aneurysm. Mediastinum/Nodes: No mediastinal lymphadenopathy. No evidence for gross hilar lymphadenopathy although assessment is limited by the lack of intravenous contrast on today's study. The esophagus has normal imaging features. There is no axillary lymphadenopathy. Lungs/Pleura: Patchy areas of ground-glass attenuation are identified in the lungs bilaterally. Subsegmental atelectasis noted in the dependent lower lobes bilaterally. No dense focal airspace consolidation. No pleural effusion. Musculoskeletal: No worrisome lytic or sclerotic osseous abnormality. CT ABDOMEN PELVIS FINDINGS Hepatobiliary: The liver shows diffusely decreased attenuation suggesting steatosis. There is no evidence for gallstones, gallbladder wall thickening, or pericholecystic fluid. No intrahepatic or extrahepatic biliary dilation. Pancreas: No focal mass lesion. No dilatation of the main duct. No intraparenchymal cyst. No peripancreatic edema. Spleen: No splenomegaly. No focal mass lesion. Adrenals/Urinary Tract: 5.2 x 2.6 cm left adrenal mass is new since a chest CT of 07/28/2017. This mass has attenuation too high to allow classification as an adenoma. 3.2 cm low-density lesion in the interpolar right kidney approaches water density and is unchanged in the interval, likely a cyst. Similar exophytic water density lesion in the lower pole right kidney measures 5 cm and is likely a cyst. No evidence for hydroureter. Urinary bladder wall is mildly thickened and ill-defined. Tiny gas bubble in the bladder lumen noted. 4.1 x 2.3 cm nodule in the right adrenal gland is also new in the interval. This has low attenuation suggesting that it represents an adenoma. Stomach/Bowel: Stomach is unremarkable. No gastric wall thickening. No evidence of outlet obstruction. Duodenum is normally positioned as is the ligament of Treitz. No small bowel wall thickening. No small bowel dilatation. The terminal ileum is normal. No gross colonic mass. No  colonic wall thickening. Diverticuli are seen scattered along the entire length of the colon without CT findings of diverticulitis. Vascular/Lymphatic: There is abdominal aortic atherosclerosis without aneurysm. There is no gastrohepatic or hepatoduodenal ligament lymphadenopathy. No intraperitoneal or retroperitoneal lymphadenopathy. Upper normal to borderline enlarged lymph nodes are seen in the external iliac pelvic sidewall bilaterally. Upper normal groin nodes are evident bilaterally. Reproductive: The prostate gland and seminal vesicles are unremarkable. Other: No intraperitoneal free fluid. Musculoskeletal: No worrisome lytic or sclerotic osseous abnormality. Small bilateral groin hernias contain only fat. IMPRESSION: 1. Mild circumferential bladder wall thickening with ill-defined bladder wall thickening. Infection/inflammation would be a consideration. Tiny gas bubble in the bladder lumen is presumably related to recent instrumentation although bladder infection could have this appearance. 2. New 4.1 x 2.3 cm right adrenal nodule with low attenuation most suggestive of benign adrenal adenoma. 5.2 x 2.6 cm left adrenal also mass new since 07/28/2017. This has attenuation too high to allow classification as an adenoma on this noncontrast CT. Follow-up CT could be used to ensure stability or MRI of the abdomen without and with contrast could be used to further evaluate for lipid poor adenoma. 3. Patchy areas of ground-glass attenuation in both lungs. Imaging features are nonspecific and may be related to an infectious or inflammatory alveolitis. 4. Upper normal to borderline enlarged lymph nodes in the external iliac chains bilaterally, nonspecific but potentially reactive. 5. Hepatic steatosis.  6. Bilateral renal cysts. Electronically Signed   By: Misty Stanley M.D.   On: 10/04/2018 17:51   Ct Head Wo Contrast  Result Date: 10/04/2018 CLINICAL DATA:  Dizziness and weakness.  Fall. EXAM: CT HEAD WITHOUT  CONTRAST TECHNIQUE: Contiguous axial images were obtained from the base of the skull through the vertex without intravenous contrast. COMPARISON:  Head CT 09/14/2018 FINDINGS: Brain: There is no mass, hemorrhage or extra-axial collection. The size and configuration of the ventricles and extra-axial CSF spaces are normal. The brain parenchyma is normal, without acute or chronic infarction. Vascular: No abnormal hyperdensity of the major intracranial arteries or dural venous sinuses. No intracranial atherosclerosis. Skull: The visualized skull base, calvarium and extracranial soft tissues are normal. Sinuses/Orbits: Small amount of fluid at the left mastoid tip. No middle ear effusion. Paranasal sinuses are clear. The orbits are normal. IMPRESSION: Normal brain. Electronically Signed   By: Ulyses Jarred M.D.   On: 10/04/2018 15:04   Ct Chest Wo Contrast  Result Date: 10/04/2018 CLINICAL DATA:  Fever of unknown origin. EXAM: CT CHEST, ABDOMEN AND PELVIS WITHOUT CONTRAST TECHNIQUE: Multidetector CT imaging of the chest, abdomen and pelvis was performed following the standard protocol without IV contrast. COMPARISON:  Abdomen pelvis CT 04/30/2015 FINDINGS: CT CHEST FINDINGS Cardiovascular: The heart size is normal. No substantial pericardial effusion. Coronary artery calcification is evident. No thoracic aortic aneurysm. Mediastinum/Nodes: No mediastinal lymphadenopathy. No evidence for gross hilar lymphadenopathy although assessment is limited by the lack of intravenous contrast on today's study. The esophagus has normal imaging features. There is no axillary lymphadenopathy. Lungs/Pleura: Patchy areas of ground-glass attenuation are identified in the lungs bilaterally. Subsegmental atelectasis noted in the dependent lower lobes bilaterally. No dense focal airspace consolidation. No pleural effusion. Musculoskeletal: No worrisome lytic or sclerotic osseous abnormality. CT ABDOMEN PELVIS FINDINGS Hepatobiliary: The  liver shows diffusely decreased attenuation suggesting steatosis. There is no evidence for gallstones, gallbladder wall thickening, or pericholecystic fluid. No intrahepatic or extrahepatic biliary dilation. Pancreas: No focal mass lesion. No dilatation of the main duct. No intraparenchymal cyst. No peripancreatic edema. Spleen: No splenomegaly. No focal mass lesion. Adrenals/Urinary Tract: 5.2 x 2.6 cm left adrenal mass is new since a chest CT of 07/28/2017. This mass has attenuation too high to allow classification as an adenoma. 3.2 cm low-density lesion in the interpolar right kidney approaches water density and is unchanged in the interval, likely a cyst. Similar exophytic water density lesion in the lower pole right kidney measures 5 cm and is likely a cyst. No evidence for hydroureter. Urinary bladder wall is mildly thickened and ill-defined. Tiny gas bubble in the bladder lumen noted. 4.1 x 2.3 cm nodule in the right adrenal gland is also new in the interval. This has low attenuation suggesting that it represents an adenoma. Stomach/Bowel: Stomach is unremarkable. No gastric wall thickening. No evidence of outlet obstruction. Duodenum is normally positioned as is the ligament of Treitz. No small bowel wall thickening. No small bowel dilatation. The terminal ileum is normal. No gross colonic mass. No colonic wall thickening. Diverticuli are seen scattered along the entire length of the colon without CT findings of diverticulitis. Vascular/Lymphatic: There is abdominal aortic atherosclerosis without aneurysm. There is no gastrohepatic or hepatoduodenal ligament lymphadenopathy. No intraperitoneal or retroperitoneal lymphadenopathy. Upper normal to borderline enlarged lymph nodes are seen in the external iliac pelvic sidewall bilaterally. Upper normal groin nodes are evident bilaterally. Reproductive: The prostate gland and seminal vesicles are unremarkable. Other: No intraperitoneal free fluid.  Musculoskeletal: No worrisome lytic or sclerotic osseous abnormality. Small bilateral groin hernias contain only fat. IMPRESSION: 1. Mild circumferential bladder wall thickening with ill-defined bladder wall thickening. Infection/inflammation would be a consideration. Tiny gas bubble in the bladder lumen is presumably related to recent instrumentation although bladder infection could have this appearance. 2. New 4.1 x 2.3 cm right adrenal nodule with low attenuation most suggestive of benign adrenal adenoma. 5.2 x 2.6 cm left adrenal also mass new since 07/28/2017. This has attenuation too high to allow classification as an adenoma on this noncontrast CT. Follow-up CT could be used to ensure stability or MRI of the abdomen without and with contrast could be used to further evaluate for lipid poor adenoma. 3. Patchy areas of ground-glass attenuation in both lungs. Imaging features are nonspecific and may be related to an infectious or inflammatory alveolitis. 4. Upper normal to borderline enlarged lymph nodes in the external iliac chains bilaterally, nonspecific but potentially reactive. 5. Hepatic steatosis. 6. Bilateral renal cysts. Electronically Signed   By: Misty Stanley M.D.   On: 10/04/2018 17:51   Mr Brain Wo Contrast  Result Date: 10/05/2018 CLINICAL DATA:  Altered level of consciousness. EXAM: MRI HEAD WITHOUT CONTRAST TECHNIQUE: Multiplanar, multiecho pulse sequences of the brain and surrounding structures were obtained without intravenous contrast. COMPARISON:  CT head 10/04/2018 FINDINGS: Brain: Ventricle size and cerebral volume normal. Negative for acute infarct, hemorrhage, mass. Scattered small white matter hyperintensities bilaterally likely due to microvascular ischemia. Vascular: Normal arterial flow voids Skull and upper cervical spine: Negative Sinuses/Orbits: Mild mucosal edema paranasal sinuses. Left mastoid effusion. Normal orbit. Other: None IMPRESSION: No acute abnormality. Mild chronic  white matter changes likely due to microvascular ischemia. Electronically Signed   By: Franchot Gallo M.D.   On: 10/05/2018 14:36     Assessment/Plan: 58 yr male had B/l knee replacements Sep 01, 2018 was admitted with fever, AKI . He was taking bactrim and keflex when he came to the Ed on 5/26/20with fever  2nd cycle of fever, altered mental status, hypotension ( more orthostasis) , weakness,   Initially feverswere persisting inspite of IV vanco and cefepime and there was no other source (Blood culture neg, urine culture neg, no pneumonia, ) the left knee was aspirated and it had 8000 wbc and he was taken for a wash out on 09/17/18. Rt knee was washed out on 09/18/18 All cultures neg Pts fever was down for 36-48 hrs and started up again on 09/19/18 at 103. Central line, femoral line were removed Cefepime was changed to zosyn on 6/2 Vanco was discontinued on 09/19/18 -added anidulafungin6/3and doxy 6/3- - he also was started on stress dose steroids on the same day and was also diagnosed with left popliteal DVT on 09/20/18 and started on heparin- Asfungal blood culture negDC anidulafungin0n 09/26/18 Doxy Dc as well. Started daptomycin- currently on dapto and ceftazidime-- for empiric gram positive ( MRSA) and gram negative coverage fever work up done as above-was neg Was afebrile from 6/4-until 6/11- is now having fevers since 6/11- intially low grade but for the past 48 hrs > 101  ? Cause   Foley removed on 10/02/18   left IJ removed  ( 10/02/18) blood cultures from 6/15 NG   morning cortisollow at 4.1 Repeat quantiferon gold sent'  Could it be drug fever from antibiotic or celexa Could it be adrenal insufficiency/crisis Has a low cortisol am and ACTH stim test was positive with no response -  HPA suppression due to stress  dose steroids he got ( 200mg Bailey Mech) for 5 days between 6/3-6/8 is unlikely as he received only a few days ACTH very high which goes against the above indicating  primary adrenal insufficiency or he could have relative adrenal insufficiency secondary to his critical illness CT scan abd /chest done and it showed patchy ground glass lungs, b/l adrenal mass- rt thought to be adenoma-left 5.2cmX 2.6 cm attenuation too high to be classified as adenoma-may benefit from endocrine consult  Bladder wall thickening Will order bladder scan to look for retention CT head after fall- N  Encephalopathyfluctuates  Was thought to be due toDT and possibleDrug reaction ( Serotonin syndrome??), compounded by worsening renal function Had resolved seemingly after starting dialysis   AKI-started dialysison 09/22/18-- Making > 4 L of urine Off dialysis Cr improving AKI limits use of vanco   Left DVT  ThrombocytopeniaPlatelet countwasDeclining since 09/22/18-HIT , changed toargatroban and platelethas normalized. Now on coumadin  Anemia   Discussed the management with patient and  Dr.Sudini

## 2018-10-05 NOTE — Consult Note (Signed)
Reason for Consult:AMS Referring Physician: Dr. Darvin Neighbours-  CC: AMS HPI:   58 y.o. male with a known history of arthritis, COPD not on home oxygen, hypertension, sleep apnea, non-insulin-dependent diabetes mellitus presents to hospital secondary to dizziness, weakness and a fall in the morning before admission. Patient had bilateral knee replacement surgeries done about 9 days before admission.Marland Kitchen  He did well for the first week.  Over the last 3 to 4 days he has been having fevers and chills.  Initially had increased frequency of urination that has decreased the last 3 days.  His urine output has been decreased.  He has been feeling dizzy and lightheaded.  Has been nauseous and had one episode of vomiting.  Denies any abdominal pain or diarrhea.  Denies any exposure to sick contacts.  His COVID-19 test is negative here.  Denies any cough or respiratory symptoms or chest pain.  He states that he fell out of bed onto his knees this morning and hurt both his knees.  After the surgery he has been getting home health physical therapy and was able to walk using his walker. In the ED his creatinine iwas found to be at increased 3.7 from 1 at baseline.  He received 4.5 L of fluids in the emergency room and his urine output after a Foley catheter was placed here was only 75 cc.  Also, he was found to be hyponatremic, acidotic.  Urine analysis showing possible infection.  Has lactic acidosis with elevated WBC and procalcitonin. Admitted for sepsis to ICU. CT head on 5/28 with no acute abnormalities.  Patient has had complicated medical course during his ICU stay .Patient has been followed by Nephrology for acute renal failure/hyponatremia. 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  Patient denies HA, denies nauseas/vting, denies visual disturbances, denies muscles weakness or sensory deficit. No sz like activity.   Past  Medical History:  Diagnosis Date  . Allergy   . Anxiety   . Arthritis    knees  . Asthma, mild intermittent, well-controlled   . Chronic obstructive asthma ([place])   . COPD (chronic obstructive pulmonary disease) ([place])   . Depression with anxiety   . Diabetes ([place])   . GERD (gastroesophageal reflux disease)   . HBP (high blood pressure)   . High cholesterol   . Renal cyst, right   . Sleep apnea    CPAP  . Swelling     Past Surgical History:  Procedure Laterality Date  . BLADDER REPAIR    . COLONOSCOPY WITH PROPOFOL N/A 04/07/2015   Procedure: COLONOSCOPY WITH PROPOFOL;  Surgeon: Lucilla Lame, MD;  Location: [facility];  Service: Endoscopy;  Laterality: N/A;  Diabetic - oral meds CPAP  . HAND SURGERY     4TH AND 5TH FINGER  . I&D KNEE WITH POLY EXCHANGE Right 09/18/2018   Procedure: IRRIGATION AND DEBRIDEMENT KNEE WITH POLY EXCHANGE;  Surgeon: Lovell Sheehan, MD;  Location: [facility];  Service: Orthopedics;  Laterality: Right;  . KNEE ARTHROSCOPY Right   . 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 TOTAL KNEE ARTHROPLASTY Left 09/17/2018   Procedure: I &D left knee and polyethylene exchange;  Surgeon: Lovell Sheehan, MD;  Location: [facility];  Service: Orthopedics;  Laterality: Left;  Marland Kitchen VEIN SURGERY Right     Family History  Problem Relation Age of Onset  . Hypertension Mother   . Diabetes  Mother   . Diabetes Father   . Hypertension Father   . Cancer Father   . Seizures Daughter   . Lupus Daughter   . Cancer Maternal Grandfather        Prostate  . Diabetes Paternal Grandfather   . Prostate cancer Maternal Uncle   . Bladder Cancer Neg Hx   . Kidney cancer Neg Hx     Social History:  reports that he quit smoking about 13 years ago. His smoking use included cigarettes. He started smoking about 23 years ago. He has a 10.00 pack-year smoking history. He has never used smokeless tobacco. He reports current alcohol use of about 10.0 standard drinks of  alcohol per week. He reports that he does not use drugs.  Allergies  Allergen Reactions  . 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 Lipitor [Atorvastatin] Hives and Itching  . Other   . Viagra  [Sildenafil Citrate]     vision loss    Medications: I have reviewed the patient's current medications.  ROS: As per HPI Physical Examination: Blood pressure 137/78, pulse 96, temperature 98.7 F (37.1 C), temperature source Oral, resp. rate 16, height 6\' 4"  (1.93 m), weight (!) 156 kg, SpO2 100 %.  Neurologic Examination Patient is alert, awake, oriented to self , time, person, knows why he is in hospital and hsi medical condition, speech is nle, follows commands. CN: PERRla, EOMI, VFF, no nystgamus, face symmetrical, face sensation is nle, palate and tongue midline/ Motor:  5/5 on both UEX.  LE: edematous leg, but can raise his leg and has antigravity strenght Sensory: Decrease tactile sensation in lower extremities ( leg edema) No coordination deficit appreciated DTR and gait was not checked at the tim eof examination.  Results for orders placed or performed during the hospital encounter of 09/12/18 (from the past 48 hour(s))  Glucose, capillary     Status: Abnormal   Collection Time: 10/03/18 12:08 PM  Result Value Ref Range   Glucose-Capillary 122 (H) 70 - 99 mg/dL  Cortisol-am, blood     Status: Abnormal   Collection Time: 10/03/18  3:22 PM  Result Value Ref Range   Cortisol - AM 4.0 (L) 6.7 - 22.6 ug/dL    Comment: Performed at [facility] [address], [place], [place] [address]  Glucose, capillary     Status: None   Collection Time: 10/03/18  5:08 PM  Result Value Ref Range   Glucose-Capillary 90 70 - 99 mg/dL  Glucose, capillary     Status: None   Collection Time: 10/03/18  8:24 PM  Result Value Ref Range   Glucose-Capillary 91 70 - 99 mg/dL  Protime-INR     Status: Abnormal   Collection Time: 10/04/18  5:42 AM  Result Value Ref  Range   Prothrombin Time 22.2 (H) 11.4 - 15.2 seconds   INR 2.0 (H) 0.8 - 1.2    Comment: (NOTE) INR goal varies based on device and disease states. Performed at [facility], [address] [place], [place] [address]   Basic metabolic panel     Status: Abnormal   Collection Time: 10/04/18  5:42 AM  Result Value Ref Range   Sodium 138 135 - 145 mmol/L   Potassium 3.4 (L) 3.5 - 5.1 mmol/L   Chloride 102 98 - 111 mmol/L   CO2 22 22 - 32 mmol/L   Glucose, Bld 94 70 - 99 mg/dL   BUN 37 (  H) 6 - 20 mg/dL   Creatinine, Ser 7.51 (H) 0.61 - 1.24 mg/dL   Calcium 8.6 (L) 8.9 - 10.3 mg/dL   GFR calc non Af Amer 7 (L) >60 mL/min   GFR calc Af Amer 8 (L) >60 mL/min   Anion gap 14 5 - 15    Comment: Performed at [facility], [address] [place], [place] [address]  CBC     Status: Abnormal   Collection Time: 10/04/18  5:42 AM  Result Value Ref Range   WBC 10.4 4.0 - 10.5 K/uL   RBC 3.14 (L) 4.22 - 5.81 MIL/uL   Hemoglobin 8.2 (L) 13.0 - 17.0 g/dL   HCT 25.9 (L) 39.0 - 52.0 %   MCV 82.5 80.0 - 100.0 fL   MCH 26.1 26.0 - 34.0 pg   MCHC 31.7 30.0 - 36.0 g/dL   RDW 15.8 (H) 11.5 - 15.5 %   Platelets 450 (H) 150 - 400 K/uL   nRBC 0.0 0.0 - 0.2 %    Comment: Performed at [facility], [address] [place], [place] [address]  Cortisol-am, blood     Status: Abnormal   Collection Time: 10/04/18  5:42 AM  Result Value Ref Range   Cortisol - AM 4.6 (L) 6.7 - 22.6 ug/dL    Comment: Performed at [facility], [place] [address], [place], [place] [address]  Sedimentation rate     Status: Abnormal   Collection Time: 10/04/18  5:42 AM  Result Value Ref Range   Sed Rate 127 (H) 0 - 20 mm/hr    Comment: Performed at [facility], [address] [place], [place] [address]  TSH     Status: None   Collection Time: 10/04/18  5:42 AM  Result Value Ref Range   TSH 2.775 0.350 - 4.500 uIU/mL    Comment: Performed by a 3rd Generation assay with a  functional sensitivity of <=0.01 uIU/mL. Performed at [facility], [address] [place],  [address]   CBC with Differential/Platelet     Status: Abnormal   Collection Time: 10/04/18  5:42 AM  Result Value Ref Range   WBC 10.2 4.0 - 10.5 K/uL   RBC 3.13 (L) 4.22 - 5.81 MIL/uL   Hemoglobin 8.2 (L) 13.0 - 17.0 g/dL   HCT 26.6 (L) 39.0 - 52.0 %   MCV 85.0 80.0 - 100.0 fL   MCH 26.2 26.0 - 34.0 pg   MCHC 30.8 30.0 - 36.0 g/dL   RDW 16.0 (H) 11.5 - 15.5 %   Platelets 472 (H) 150 - 400 K/uL   nRBC 0.0 0.0 - 0.2 %   Neutrophils Relative % 57 %   Neutro Abs 5.7 1.7 - 7.7 K/uL   Lymphocytes Relative 23 %   Lymphs Abs 2.3 0.7 - 4.0 K/uL   Monocytes Relative 16 %   Monocytes Absolute 1.7 (H) 0.1 - 1.0 K/uL   Eosinophils Relative 3 %   Eosinophils Absolute 0.3 0.0 - 0.5 K/uL   Basophils Relative 1 %   Basophils Absolute 0.1 0.0 - 0.1 K/uL   Immature Granulocytes 0 %   Abs Immature Granulocytes 0.04 0.00 - 0.07 K/uL    Comment: Performed at [facility], [address] [place], [place] [address]  Glucose, capillary     Status: None   Collection Time: 10/04/18  8:47 AM  Result Value Ref Range   Glucose-Capillary 78 70 - 99 mg/dL   Comment 1  Notify RN    Comment 2 Document in Chart   Glucose, capillary     Status: None   Collection Time: 10/04/18 11:37 AM  Result Value Ref Range   Glucose-Capillary 86 70 - 99 mg/dL   Comment 1 Notify RN    Comment 2 Document in Chart   Glucose, capillary     Status: None   Collection Time: 10/04/18  1:57 PM  Result Value Ref Range   Glucose-Capillary 82 70 - 99 mg/dL   Comment 1 Notify RN    Comment 2 Document in Chart   Glucose, capillary     Status: None   Collection Time: 10/04/18  5:42 PM  Result Value Ref Range   Glucose-Capillary 82 70 - 99 mg/dL   Comment 1 Notify RN    Comment 2 Document in Chart   Glucose, capillary     Status: None   Collection Time: 10/04/18  9:00 PM  Result Value Ref Range    Glucose-Capillary 79 70 - 99 mg/dL  Protime-INR     Status: Abnormal   Collection Time: 10/05/18  5:06 AM  Result Value Ref Range   Prothrombin Time 24.9 (H) 11.4 - 15.2 seconds   INR 2.3 (H) 0.8 - 1.2    Comment: (NOTE) INR goal varies based on device and disease states. Performed at [facility], [address] [place], [place] [address]   CK     Status: None   Collection Time: 10/05/18  5:06 AM  Result Value Ref Range   Total CK 92 49 - 397 U/L    Comment: Performed at [facility], [address] [place], [place] [address]  Basic metabolic panel     Status: Abnormal   Collection Time: 10/05/18  5:06 AM  Result Value Ref Range   Sodium 137 135 - 145 mmol/L   Potassium 3.1 (L) 3.5 - 5.1 mmol/L   Chloride 103 98 - 111 mmol/L   CO2 21 (L) 22 - 32 mmol/L   Glucose, Bld 89 70 - 99 mg/dL   BUN 35 (H) 6 - 20 mg/dL   Creatinine, Ser 6.63 (H) 0.61 - 1.24 mg/dL   Calcium 8.3 (L) 8.9 - 10.3 mg/dL   GFR calc non Af Amer 8 (L) >60 mL/min   GFR calc Af Amer 10 (L) >60 mL/min   Anion gap 13 5 - 15    Comment: Performed at [facility], [address] [place], [place] [address]  CBC with Differential/Platelet     Status: Abnormal   Collection Time: 10/05/18  5:06 AM  Result Value Ref Range   WBC 8.3 4.0 - 10.5 K/uL   RBC 2.96 (L) 4.22 - 5.81 MIL/uL   Hemoglobin 7.8 (L) 13.0 - 17.0 g/dL   HCT 24.3 (L) 39.0 - 52.0 %   MCV 82.1 80.0 - 100.0 fL   MCH 26.4 26.0 - 34.0 pg   MCHC 32.1 30.0 - 36.0 g/dL   RDW 15.9 (H) 11.5 - 15.5 %   Platelets 354 150 - 400 K/uL   nRBC 0.0 0.0 - 0.2 %   Neutrophils Relative % 51 %   Neutro Abs 4.3 1.7 - 7.7 K/uL   Lymphocytes Relative 26 %   Lymphs Abs 2.1 0.7 - 4.0 K/uL   Monocytes Relative 15 %   Monocytes Absolute 1.2 (H) 0.1 - 1.0 K/uL   Eosinophils Relative 5 %   Eosinophils Absolute 0.4 0.0 - 0.5 K/uL   Basophils Relative  2 %   Basophils Absolute 0.1 0.0 - 0.1 K/uL   WBC Morphology MORPHOLOGY UNREMARKABLE    RBC  Morphology MORPHOLOGY UNREMARKABLE    Smear Review Normal platelet morphology    Immature Granulocytes 1 %   Abs Immature Granulocytes 0.04 0.00 - 0.07 K/uL    Comment: Performed at [facility], [address] [place], [place] [address]  ACTH stimulation, 3 time points     Status: None   Collection Time: 10/05/18  7:10 AM  Result Value Ref Range   Cortisol, Base 5.2 ug/dL    Comment: NO NORMAL RANGE ESTABLISHED FOR THIS TEST   Cortisol, 30 Min 5.3 ug/dL   Cortisol, 60 Min 5.6 ug/dL    Comment: Performed at [facility], [place] [address] [place], [place] [address]  Glucose, capillary     Status: None   Collection Time: 10/05/18  8:00 AM  Result Value Ref Range   Glucose-Capillary 80 70 - 99 mg/dL   Comment 1 Notify RN     Recent Results (from the past 240 hour(s))  Novel Coronavirus, NAA (hospital order; send-out to ref lab)     Status: None   Collection Time: 09/29/18  4:36 PM   Specimen: Nasopharyngeal Swab; Respiratory  Result Value Ref Range Status   SARS-CoV-2, NAA NOT DETECTED NOT DETECTED Final    Comment: (NOTE) This test was developed and its performance characteristics determined by [employer]. This test has not been FDA cleared or approved. This test has been authorized by FDA under an Emergency Use Authorization (EUA). This test is only authorized for the duration of time the declaration that circumstances exist justifying the authorization of the emergency use of in vitro diagnostic tests for detection of SARS-CoV-2 virus and/or diagnosis of COVID-19 infection under section 564(b)(1) of the Act, 21 U.S.C. 858IFO-2(D)(7), unless the authorization is terminated or revoked sooner. When diagnostic testing is negative, the possibility of a false negative result should be considered in the context of a patient's recent exposures and the presence of clinical signs and symptoms consistent with COVID-19. An individual without symptoms of COVID-19  and who is not shedding SARS-CoV-2 virus would expect to have a negative (not detected) result in this assay. Performed  At: [facility] [address] [place], [place] [id number] Rush Farmer MD HM:[id number]    [place]  Final    Comment: Performed at [facility], [address] [place], [place] [address]  CULTURE, BLOOD (ROUTINE X 2) w Reflex to ID Panel     Status: None (Preliminary result)   Collection Time: 10/02/18  5:26 PM   Specimen: BLOOD  Result Value Ref Range Status   Specimen Description BLOOD BLOOD RIGHT FOREARM  Final   Special Requests   Final    BOTTLES DRAWN AEROBIC AND ANAEROBIC Blood Culture adequate volume   Culture   Final    NO GROWTH 3 DAYS Performed at [facility], [address] [place],  [address]    Report Status PENDING  Incomplete  CULTURE, BLOOD (ROUTINE X 2) w Reflex to ID Panel     Status: None (Preliminary result)   Collection Time: 10/02/18  5:41 PM   Specimen: BLOOD  Result Value Ref Range Status   Specimen Description BLOOD BLOOD RIGHT HAND  Final   Special Requests   Final    BOTTLES DRAWN AEROBIC AND ANAEROBIC Blood Culture adequate volume   Culture   Final    NO GROWTH  3 DAYS Performed at [facility], [address] [place],  [address]    Report Status PENDING  Incomplete    Ct Abdomen Pelvis Wo Contrast  Result Date: 10/04/2018 CLINICAL DATA:  Fever of unknown origin. EXAM: CT CHEST, ABDOMEN AND PELVIS WITHOUT CONTRAST TECHNIQUE: Multidetector CT imaging of the chest, abdomen and pelvis was performed following the standard protocol without IV contrast. COMPARISON:  Abdomen pelvis CT 04/30/2015 FINDINGS: CT CHEST FINDINGS Cardiovascular: The heart size is normal. No substantial pericardial effusion. Coronary artery calcification is evident. No thoracic aortic aneurysm. Mediastinum/Nodes: No mediastinal lymphadenopathy. No evidence for gross hilar  lymphadenopathy although assessment is limited by the lack of intravenous contrast on today's study. The esophagus has normal imaging features. There is no axillary lymphadenopathy. Lungs/Pleura: Patchy areas of ground-glass attenuation are identified in the lungs bilaterally. Subsegmental atelectasis noted in the dependent lower lobes bilaterally. No dense focal airspace consolidation. No pleural effusion. Musculoskeletal: No worrisome lytic or sclerotic osseous abnormality. CT ABDOMEN PELVIS FINDINGS Hepatobiliary: The liver shows diffusely decreased attenuation suggesting steatosis. There is no evidence for gallstones, gallbladder wall thickening, or pericholecystic fluid. No intrahepatic or extrahepatic biliary dilation. Pancreas: No focal mass lesion. No dilatation of the main duct. No intraparenchymal cyst. No peripancreatic edema. Spleen: No splenomegaly. No focal mass lesion. Adrenals/Urinary Tract: 5.2 x 2.6 cm left adrenal mass is new since a chest CT of 07/28/2017. This mass has attenuation too high to allow classification as an adenoma. 3.2 cm low-density lesion in the interpolar right kidney approaches water density and is unchanged in the interval, likely a cyst. Similar exophytic water density lesion in the lower pole right kidney measures 5 cm and is likely a cyst. No evidence for hydroureter. Urinary bladder wall is mildly thickened and ill-defined. Tiny gas bubble in the bladder lumen noted. 4.1 x 2.3 cm nodule in the right adrenal gland is also new in the interval. This has low attenuation suggesting that it represents an adenoma. Stomach/Bowel: Stomach is unremarkable. No gastric wall thickening. No evidence of outlet obstruction. Duodenum is normally positioned as is the ligament of Treitz. No small bowel wall thickening. No small bowel dilatation. The terminal ileum is normal. No gross colonic mass. No colonic wall thickening. Diverticuli are seen scattered along the entire length of the colon  without CT findings of diverticulitis. Vascular/Lymphatic: There is abdominal aortic atherosclerosis without aneurysm. There is no gastrohepatic or hepatoduodenal ligament lymphadenopathy. No intraperitoneal or retroperitoneal lymphadenopathy. Upper normal to borderline enlarged lymph nodes are seen in the external iliac pelvic sidewall bilaterally. Upper normal groin nodes are evident bilaterally. Reproductive: The prostate gland and seminal vesicles are unremarkable. Other: No intraperitoneal free fluid. Musculoskeletal: No worrisome lytic or sclerotic osseous abnormality. Small bilateral groin hernias contain only fat. IMPRESSION: 1. Mild circumferential bladder wall thickening with ill-defined bladder wall thickening. Infection/inflammation would be a consideration. Tiny gas bubble in the bladder lumen is presumably related to recent instrumentation although bladder infection could have this appearance. 2. New 4.1 x 2.3 cm right adrenal nodule with low attenuation most suggestive of benign adrenal adenoma. 5.2 x 2.6 cm left adrenal also mass new since 07/28/2017. This has attenuation too high to allow classification as an adenoma on this noncontrast CT. Follow-up CT could be used to ensure stability or MRI of the abdomen without and with contrast could be used to further evaluate for lipid poor adenoma. 3. Patchy areas of ground-glass attenuation in both lungs. Imaging features are nonspecific and may be related to  an infectious or inflammatory alveolitis. 4. Upper normal to borderline enlarged lymph nodes in the external iliac chains bilaterally, nonspecific but potentially reactive. 5. Hepatic steatosis. 6. Bilateral renal cysts. Electronically Signed   By: Misty Stanley M.D.   On: 10/04/2018 17:51   Ct Head Wo Contrast  Result Date: 10/04/2018 CLINICAL DATA:  Dizziness and weakness.  Fall. EXAM: CT HEAD WITHOUT CONTRAST TECHNIQUE: Contiguous axial images were obtained from the base of the skull through  the vertex without intravenous contrast. COMPARISON:  Head CT 09/14/2018 FINDINGS: Brain: There is no mass, hemorrhage or extra-axial collection. The size and configuration of the ventricles and extra-axial CSF spaces are normal. The brain parenchyma is normal, without acute or chronic infarction. Vascular: No abnormal hyperdensity of the major intracranial arteries or dural venous sinuses. No intracranial atherosclerosis. Skull: The visualized skull base, calvarium and extracranial soft tissues are normal. Sinuses/Orbits: Small amount of fluid at the left mastoid tip. No middle ear effusion. Paranasal sinuses are clear. The orbits are normal. IMPRESSION: Normal brain. Electronically Signed   By: Ulyses Jarred M.D.   On: 10/04/2018 15:04   Ct Chest Wo Contrast  Result Date: 10/04/2018 CLINICAL DATA:  Fever of unknown origin. EXAM: CT CHEST, ABDOMEN AND PELVIS WITHOUT CONTRAST TECHNIQUE: Multidetector CT imaging of the chest, abdomen and pelvis was performed following the standard protocol without IV contrast. COMPARISON:  Abdomen pelvis CT 04/30/2015 FINDINGS: CT CHEST FINDINGS Cardiovascular: The heart size is normal. No substantial pericardial effusion. Coronary artery calcification is evident. No thoracic aortic aneurysm. Mediastinum/Nodes: No mediastinal lymphadenopathy. No evidence for gross hilar lymphadenopathy although assessment is limited by the lack of intravenous contrast on today's study. The esophagus has normal imaging features. There is no axillary lymphadenopathy. Lungs/Pleura: Patchy areas of ground-glass attenuation are identified in the lungs bilaterally. Subsegmental atelectasis noted in the dependent lower lobes bilaterally. No dense focal airspace consolidation. No pleural effusion. Musculoskeletal: No worrisome lytic or sclerotic osseous abnormality. CT ABDOMEN PELVIS FINDINGS Hepatobiliary: The liver shows diffusely decreased attenuation suggesting steatosis. There is no evidence for  gallstones, gallbladder wall thickening, or pericholecystic fluid. No intrahepatic or extrahepatic biliary dilation. Pancreas: No focal mass lesion. No dilatation of the main duct. No intraparenchymal cyst. No peripancreatic edema. Spleen: No splenomegaly. No focal mass lesion. Adrenals/Urinary Tract: 5.2 x 2.6 cm left adrenal mass is new since a chest CT of 07/28/2017. This mass has attenuation too high to allow classification as an adenoma. 3.2 cm low-density lesion in the interpolar right kidney approaches water density and is unchanged in the interval, likely a cyst. Similar exophytic water density lesion in the lower pole right kidney measures 5 cm and is likely a cyst. No evidence for hydroureter. Urinary bladder wall is mildly thickened and ill-defined. Tiny gas bubble in the bladder lumen noted. 4.1 x 2.3 cm nodule in the right adrenal gland is also new in the interval. This has low attenuation suggesting that it represents an adenoma. Stomach/Bowel: Stomach is unremarkable. No gastric wall thickening. No evidence of outlet obstruction. Duodenum is normally positioned as is the ligament of Treitz. No small bowel wall thickening. No small bowel dilatation. The terminal ileum is normal. No gross colonic mass. No colonic wall thickening. Diverticuli are seen scattered along the entire length of the colon without CT findings of diverticulitis. Vascular/Lymphatic: There is abdominal aortic atherosclerosis without aneurysm. There is no gastrohepatic or hepatoduodenal ligament lymphadenopathy. No intraperitoneal or retroperitoneal lymphadenopathy. Upper normal to borderline enlarged lymph nodes are seen in the external  iliac pelvic sidewall bilaterally. Upper normal groin nodes are evident bilaterally. Reproductive: The prostate gland and seminal vesicles are unremarkable. Other: No intraperitoneal free fluid. Musculoskeletal: No worrisome lytic or sclerotic osseous abnormality. Small bilateral groin hernias  contain only fat. IMPRESSION: 1. Mild circumferential bladder wall thickening with ill-defined bladder wall thickening. Infection/inflammation would be a consideration. Tiny gas bubble in the bladder lumen is presumably related to recent instrumentation although bladder infection could have this appearance. 2. New 4.1 x 2.3 cm right adrenal nodule with low attenuation most suggestive of benign adrenal adenoma. 5.2 x 2.6 cm left adrenal also mass new since 07/28/2017. This has attenuation too high to allow classification as an adenoma on this noncontrast CT. Follow-up CT could be used to ensure stability or MRI of the abdomen without and with contrast could be used to further evaluate for lipid poor adenoma. 3. Patchy areas of ground-glass attenuation in both lungs. Imaging features are nonspecific and may be related to an infectious or inflammatory alveolitis. 4. Upper normal to borderline enlarged lymph nodes in the external iliac chains bilaterally, nonspecific but potentially reactive. 5. Hepatic steatosis. 6. Bilateral renal cysts. Electronically Signed   By: Misty Stanley M.D.   On: 10/04/2018 17:51     Assessment/Plan:  58 y.o. male with a known history of arthritis, COPD not on home oxygen, hypertension, sleep apnea, non-insulin-dependent diabetes mellitus admitted to with sepsis 2/2 UTI/septic arthritis. Patient with complicated medical course. And f/up by orthopedist, ID, nephrologist, hemtologist.  Patient medical conditon has improved on daily basis and on examination today, patient is alert, awake, oreintedX4, with no neurological deficit appreciated ( LEX  exam prevented  due to edematous leg). RECs:  Based on his  neurological exam today and clinical improvement during his hospital stay most likely his MS was due to his sepsis/infection, metabolic /electrolytes abnlities.recommend neuro protectives measures including normothermia, normoglycemia, correct electrolytes/metabolci abnilites and treat  infection. I will not pursue further neurological investigations for the time being. Remaining of treat,ment by primary team.Please call with any questions and/or new neurological findings.    10/05/2018, 11:04 AM

## 2018-10-05 NOTE — Progress Notes (Signed)
Pharmacy Antibiotic Note  Isaac Cross is a 58 y.o. male admitted on 09/12/2018 with sepsis.  Pharmacy has been consulted for Daptomycin dosing.  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  He underwent I&D of both knees.  Cultures remain unrevealing but had received antibiotics prior to surgery.  He developed AKI and was requiring HD but Nephrology is hopeful that his kidney function is returned and may not need further HD (UOP improved).    Day #24 antibiotics/Day #8 daptomyicn/Day #2 Levaquin  (s/p 7 days of eraxis) Per ID-plan abx thru 10/31/2018 (4 weeks) per neph note - Scr  8.38 > 7.51 > 6.63 - CK 6/18 = 92 Temp 101.3- having fevers- Ceftazidime changed to Levaquin   Plan:  Ceftazidime changed to Levaquin yesterday 6/17.  Continue Daptomycin 6mg /kg (per Adj BW for BMI >40) 700mg  IV q48h  Check CK level in 3  days since had recent CK elevation.    His CK was WNL at 92 and was stable today  Need to monitor renal function closely as his kidneys function appears to be returning.  Off HD currently    Height: 6\' 4"  (193 cm) Weight: (!) 343 lb 14.7 oz (156 kg) IBW/kg (Calculated) : 86.8  Temp (24hrs), Avg:99.7 F (37.6 C), Min:98.6 F (37 C), Max:102.4 F (39.1 C)  Recent Labs  Lab 10/01/18 0427 10/01/18 0745 10/02/18 0506 10/03/18 0302 10/04/18 0542 10/05/18 0506  WBC 10.9*  --  10.3 10.4 10.2  10.4 8.3  CREATININE  --  8.74* 8.63* 8.38* 7.51* 6.63*    Estimated Creatinine Clearance: 19.2 mL/min (A) (by C-G formula based on SCr of 6.63 mg/dL (H)).    Allergies  Allergen Reactions  . 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 Lipitor [Atorvastatin] Hives and Itching  . Other   . Viagra  [Sildenafil Citrate]     vision loss     Thank you for allowing pharmacy to be a part of this patient's care.  Chinita Greenland PharmD Clinical Pharmacist 10/05/2018

## 2018-10-05 NOTE — Progress Notes (Signed)
[facility] [place] NAME: Isaac Cross    MR#:  [id number]  DATE OF BIRTH:  Mar 06, 1961  SUBJECTIVE:   More awake and alert. Pain in bilateral knees is well controlled.  REVIEW OF SYSTEMS:  Review of Systems  Constitutional: Positive for malaise/fatigue. Negative for chills, fever and weight loss.  HENT: Negative for ear discharge, ear pain and nosebleeds.   Eyes: Negative for blurred vision, pain and discharge.  Respiratory: Negative for sputum production, shortness of breath, wheezing and stridor.   Cardiovascular: Negative for chest pain, palpitations, orthopnea and PND.  Gastrointestinal: Negative for abdominal pain, diarrhea, nausea and vomiting.  Genitourinary: Negative for frequency and urgency.  Musculoskeletal: Negative for back pain and joint pain.  Neurological: Negative for sensory change, speech change, focal weakness and weakness.  Psychiatric/Behavioral: Negative for depression and hallucinations. The patient is not nervous/anxious.    DRUG ALLERGIES:   Allergies  Allergen Reactions  . 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 Lipitor [Atorvastatin] Hives and Itching  . Other   . Viagra  [Sildenafil Citrate]     vision loss    VITALS:  Blood pressure 137/78, pulse 96, temperature 98.7 F (37.1 C), temperature source Oral, resp. rate 16, height 6\' 4"  (1.93 m), weight (!) 156 kg, SpO2 100 %.  PHYSICAL EXAMINATION:  Physical Exam   GENERAL:  58 y.o.-year-old patient lying in the bed with no acute distress. Critically ill appearing EYES: Pupils equal, round, reactive to light and accommodation. No scleral icterus.  HEENT: Head atraumatic, normocephalic. Oropharynx and nasopharynx clear.  NECK:  Supple, no jugular venous distention. No thyroid enlargement, no tenderness.  LUNGS: Normal breath sounds bilaterally, no wheezing, rales,rhonchi or crepitation. No use of accessory muscles of  respiration.  Decreased bibasilar breath sounds CARDIOVASCULAR: S1, S2 normal. No murmurs, rubs, or gallops. ABDOMEN: Soft, nontender, nondistended. Bowel sounds present. No organomegaly or mass. EXTREMITIES: Swelling of both legs, bilateral knee dressing.  No  cyanosis, or clubbing.  NEUROLOGIC: Cranial nerves II through XII are intact. Moving all extremities well PSYCHIATRIC: patient is alert and oriented x3 SKIN: No obvious rash, lesion, or ulcer.    LABORATORY PANEL:   CBC Recent Labs  Lab 10/05/18 0506  WBC 8.3  HGB 7.8*  HCT 24.3*  PLT 354   ------------------------------------------------------------------------------------------------------------------  Chemistries  Recent Labs  Lab 09/29/18 1642  10/02/18 0506  10/05/18 0506  NA  --    < > 137   < > 137  K  --    < > 3.4*   < > 3.1*  CL  --    < > 99   < > 103  CO2  --    < > 24   < > 21*  GLUCOSE  --    < > 106*   < > 89  BUN  --    < > 36*   < > 35*  CREATININE  --    < > 8.63*   < > 6.63*  CALCIUM  --    < > 8.3*   < > 8.3*  MG  --    < > 1.9  --   --   AST 38  --   --   --   --   ALT 22  --   --   --   --   ALKPHOS 58  --   --   --   --  BILITOT 0.7  --   --   --   --    < > = values in this interval not displayed.   ------------------------------------------------------------------------------------------------------------------  Cardiac Enzymes No results for input(s): TROPONINI in the last 168 hours. ------------------------------------------------------------------------------------------------------------------  RADIOLOGY:  Ct Abdomen Pelvis Wo Contrast  Result Date: 10/04/2018 CLINICAL DATA:  Fever of unknown origin. EXAM: CT CHEST, ABDOMEN AND PELVIS WITHOUT CONTRAST TECHNIQUE: Multidetector CT imaging of the chest, abdomen and pelvis was performed following the standard protocol without IV contrast. COMPARISON:  Abdomen pelvis CT 04/30/2015 FINDINGS: CT CHEST FINDINGS Cardiovascular: The heart  size is normal. No substantial pericardial effusion. Coronary artery calcification is evident. No thoracic aortic aneurysm. Mediastinum/Nodes: No mediastinal lymphadenopathy. No evidence for gross hilar lymphadenopathy although assessment is limited by the lack of intravenous contrast on today's study. The esophagus has normal imaging features. There is no axillary lymphadenopathy. Lungs/Pleura: Patchy areas of ground-glass attenuation are identified in the lungs bilaterally. Subsegmental atelectasis noted in the dependent lower lobes bilaterally. No dense focal airspace consolidation. No pleural effusion. Musculoskeletal: No worrisome lytic or sclerotic osseous abnormality. CT ABDOMEN PELVIS FINDINGS Hepatobiliary: The liver shows diffusely decreased attenuation suggesting steatosis. There is no evidence for gallstones, gallbladder wall thickening, or pericholecystic fluid. No intrahepatic or extrahepatic biliary dilation. Pancreas: No focal mass lesion. No dilatation of the main duct. No intraparenchymal cyst. No peripancreatic edema. Spleen: No splenomegaly. No focal mass lesion. Adrenals/Urinary Tract: 5.2 x 2.6 cm left adrenal mass is new since a chest CT of 07/28/2017. This mass has attenuation too high to allow classification as an adenoma. 3.2 cm low-density lesion in the interpolar right kidney approaches water density and is unchanged in the interval, likely a cyst. Similar exophytic water density lesion in the lower pole right kidney measures 5 cm and is likely a cyst. No evidence for hydroureter. Urinary bladder wall is mildly thickened and ill-defined. Tiny gas bubble in the bladder lumen noted. 4.1 x 2.3 cm nodule in the right adrenal gland is also new in the interval. This has low attenuation suggesting that it represents an adenoma. Stomach/Bowel: Stomach is unremarkable. No gastric wall thickening. No evidence of outlet obstruction. Duodenum is normally positioned as is the ligament of Treitz. No  small bowel wall thickening. No small bowel dilatation. The terminal ileum is normal. No gross colonic mass. No colonic wall thickening. Diverticuli are seen scattered along the entire length of the colon without CT findings of diverticulitis. Vascular/Lymphatic: There is abdominal aortic atherosclerosis without aneurysm. There is no gastrohepatic or hepatoduodenal ligament lymphadenopathy. No intraperitoneal or retroperitoneal lymphadenopathy. Upper normal to borderline enlarged lymph nodes are seen in the external iliac pelvic sidewall bilaterally. Upper normal groin nodes are evident bilaterally. Reproductive: The prostate gland and seminal vesicles are unremarkable. Other: No intraperitoneal free fluid. Musculoskeletal: No worrisome lytic or sclerotic osseous abnormality. Small bilateral groin hernias contain only fat. IMPRESSION: 1. Mild circumferential bladder wall thickening with ill-defined bladder wall thickening. Infection/inflammation would be a consideration. Tiny gas bubble in the bladder lumen is presumably related to recent instrumentation although bladder infection could have this appearance. 2. New 4.1 x 2.3 cm right adrenal nodule with low attenuation most suggestive of benign adrenal adenoma. 5.2 x 2.6 cm left adrenal also mass new since 07/28/2017. This has attenuation too high to allow classification as an adenoma on this noncontrast CT. Follow-up CT could be used to ensure stability or MRI of the abdomen without and with contrast could be used to further evaluate for lipid  poor adenoma. 3. Patchy areas of ground-glass attenuation in both lungs. Imaging features are nonspecific and may be related to an infectious or inflammatory alveolitis. 4. Upper normal to borderline enlarged lymph nodes in the external iliac chains bilaterally, nonspecific but potentially reactive. 5. Hepatic steatosis. 6. Bilateral renal cysts. Electronically Signed   By: Misty Stanley M.D.   On: 10/04/2018 17:51   Ct  Head Wo Contrast  Result Date: 10/04/2018 CLINICAL DATA:  Dizziness and weakness.  Fall. EXAM: CT HEAD WITHOUT CONTRAST TECHNIQUE: Contiguous axial images were obtained from the base of the skull through the vertex without intravenous contrast. COMPARISON:  Head CT 09/14/2018 FINDINGS: Brain: There is no mass, hemorrhage or extra-axial collection. The size and configuration of the ventricles and extra-axial CSF spaces are normal. The brain parenchyma is normal, without acute or chronic infarction. Vascular: No abnormal hyperdensity of the major intracranial arteries or dural venous sinuses. No intracranial atherosclerosis. Skull: The visualized skull base, calvarium and extracranial soft tissues are normal. Sinuses/Orbits: Small amount of fluid at the left mastoid tip. No middle ear effusion. Paranasal sinuses are clear. The orbits are normal. IMPRESSION: Normal brain. Electronically Signed   By: Ulyses Jarred M.D.   On: 10/04/2018 15:04   Ct Chest Wo Contrast  Result Date: 10/04/2018 CLINICAL DATA:  Fever of unknown origin. EXAM: CT CHEST, ABDOMEN AND PELVIS WITHOUT CONTRAST TECHNIQUE: Multidetector CT imaging of the chest, abdomen and pelvis was performed following the standard protocol without IV contrast. COMPARISON:  Abdomen pelvis CT 04/30/2015 FINDINGS: CT CHEST FINDINGS Cardiovascular: The heart size is normal. No substantial pericardial effusion. Coronary artery calcification is evident. No thoracic aortic aneurysm. Mediastinum/Nodes: No mediastinal lymphadenopathy. No evidence for gross hilar lymphadenopathy although assessment is limited by the lack of intravenous contrast on today's study. The esophagus has normal imaging features. There is no axillary lymphadenopathy. Lungs/Pleura: Patchy areas of ground-glass attenuation are identified in the lungs bilaterally. Subsegmental atelectasis noted in the dependent lower lobes bilaterally. No dense focal airspace consolidation. No pleural effusion.  Musculoskeletal: No worrisome lytic or sclerotic osseous abnormality. CT ABDOMEN PELVIS FINDINGS Hepatobiliary: The liver shows diffusely decreased attenuation suggesting steatosis. There is no evidence for gallstones, gallbladder wall thickening, or pericholecystic fluid. No intrahepatic or extrahepatic biliary dilation. Pancreas: No focal mass lesion. No dilatation of the main duct. No intraparenchymal cyst. No peripancreatic edema. Spleen: No splenomegaly. No focal mass lesion. Adrenals/Urinary Tract: 5.2 x 2.6 cm left adrenal mass is new since a chest CT of 07/28/2017. This mass has attenuation too high to allow classification as an adenoma. 3.2 cm low-density lesion in the interpolar right kidney approaches water density and is unchanged in the interval, likely a cyst. Similar exophytic water density lesion in the lower pole right kidney measures 5 cm and is likely a cyst. No evidence for hydroureter. Urinary bladder wall is mildly thickened and ill-defined. Tiny gas bubble in the bladder lumen noted. 4.1 x 2.3 cm nodule in the right adrenal gland is also new in the interval. This has low attenuation suggesting that it represents an adenoma. Stomach/Bowel: Stomach is unremarkable. No gastric wall thickening. No evidence of outlet obstruction. Duodenum is normally positioned as is the ligament of Treitz. No small bowel wall thickening. No small bowel dilatation. The terminal ileum is normal. No gross colonic mass. No colonic wall thickening. Diverticuli are seen scattered along the entire length of the colon without CT findings of diverticulitis. Vascular/Lymphatic: There is abdominal aortic atherosclerosis without aneurysm. There is no gastrohepatic or  hepatoduodenal ligament lymphadenopathy. No intraperitoneal or retroperitoneal lymphadenopathy. Upper normal to borderline enlarged lymph nodes are seen in the external iliac pelvic sidewall bilaterally. Upper normal groin nodes are evident bilaterally.  Reproductive: The prostate gland and seminal vesicles are unremarkable. Other: No intraperitoneal free fluid. Musculoskeletal: No worrisome lytic or sclerotic osseous abnormality. Small bilateral groin hernias contain only fat. IMPRESSION: 1. Mild circumferential bladder wall thickening with ill-defined bladder wall thickening. Infection/inflammation would be a consideration. Tiny gas bubble in the bladder lumen is presumably related to recent instrumentation although bladder infection could have this appearance. 2. New 4.1 x 2.3 cm right adrenal nodule with low attenuation most suggestive of benign adrenal adenoma. 5.2 x 2.6 cm left adrenal also mass new since 07/28/2017. This has attenuation too high to allow classification as an adenoma on this noncontrast CT. Follow-up CT could be used to ensure stability or MRI of the abdomen without and with contrast could be used to further evaluate for lipid poor adenoma. 3. Patchy areas of ground-glass attenuation in both lungs. Imaging features are nonspecific and may be related to an infectious or inflammatory alveolitis. 4. Upper normal to borderline enlarged lymph nodes in the external iliac chains bilaterally, nonspecific but potentially reactive. 5. Hepatic steatosis. 6. Bilateral renal cysts. Electronically Signed   By: Misty Stanley M.D.   On: 10/04/2018 17:51    EKG:   Orders placed or performed during the hospital encounter of 09/12/18  . EKG 12-Lead  . EKG 12-Lead  . EKG 12-Lead  . EKG 12-Lead  . EKG 12-Lead  . EKG 12-Lead    ASSESSMENT AND PLAN:    EdwardGradyis a58 y.o.malewith a known history of arthritis, COPD not on home oxygen, hypertension, sleep apnea, non-insulin-dependent diabetes mellitus presents to hospital secondary to dizziness, weakness and a fall  * Sepsis -s/p Recent bilateral knee replacement surgeries as outpatient and now bilateral knee washouts done this admission - Fluid Cultures negative.  But patient was on  antibiotics prior to his washout and sampling - Fungal cultures negative -Appreciate ID consult.   -Patient was on zosyn, doxycycline --now on ceftazidime and daptomycin (last dose 10/31/2018) -completed 7 days of anidulafungin   CT chest abd with urinary bladder wall thickening and diffuse interstitial changes on bilateral lungs.  Sent message to [facility].  Patient had bladder surgery in the past and recent UTI.  Check UA Afebrile today  *Acute non- Oliguric renal failure-likely ATN from sepsis/hypotension - appreciate nephrology input. -Hemodialysis started 09/22/2018 -Hold lisinopril and metformin and other nephrotoxins.    The patient has good urine output, creatinine down to 7 -On IV fluids.  Discussed with Dr. Juleen China.  * Acute encephalopathy-secondary to metabolic causes, sepsis and uremia. -No focal deficits Patient also went through delirium tremens and ICU delirium Ordered MRI of the brain without contrast. Consulted neurology Dr. Melonie Florida. Appreciate input.  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  * DVT prophylaxis- argatroban drip and coumadin  *  Low cortisol level.  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  Ordered ACTH level and cosyntropin test .  Results pending Patient was on steroids in the ICU which are now stopped. Central finding of adrenal adenomas on CT scan.  Will check plasma free metanephrines. Discussed with endocrine Dr. Gabriel Carina.  Unlikely cause of fevers.  * Anemia of chronic disease.  Stable.  * Hypokalemia.  Potassium supplemented  * Hypomagnesemia.  Improved with IV magnesium.  Called and discussed  with wife.  Updated on plan of care.  Answered all questions.  CODE STATUS: Full code  TOTAL TIME TAKING CARE OF THIS PATIENT: 35 minutes.   Discharge to skilled nursing facility versus inpatient rehab in 3 to 4 days  Neita Carp M.D on 10/05/2018 at 11:06 AM   Between 7am to 6pm - Pager - [phone]  After 6pm go to [email or web address] - password [email or web address] [facility]  Office  [phone]  CC: Primary care physician; Steele Sizer, MD

## 2018-10-05 NOTE — Progress Notes (Signed)
Occupational Therapy Treatment Patient Details Name: Isaac Cross MRN: [id number] DOB: 05-27-1960 Today's Date: 10/05/2018    History of present illness Pt is a 58 y.o. male presenting to hospital 09/12/18 after rolling OOB landing on L knee; pt with increased dizziness x2 days, fevers x3 days, and generalized weakness.  S/p B TKR 09/01/18 in [place] (pt reports discharging home same day).  Pt admitted with sepsis, acute renal failure, and B TKR.  Pt s/p L knee 5/31 and R knee 6/1 I&D with poly exchange.  Pt noted with severe encephalopathy from DT's with extensive ETOH abuse.  L LE DVT noted.  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  HD started 6/5 with L IJ temporary HD catheter.  PMH includes B TKR 09/01/18 in [place], DM, asthma, R vein surgery, COPD. Right IJ line removed 6/16. 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   OT comments  Pt seen for OT/PT co-tx this date. Pt eager to participate. Pt reports he is able to recall the events of his fall the previous date and generally able to recall properly. Encouraged pt to request assist from staff the next time he has to use urinal or have BM. Pt verbalized understanding. Pt conversational, able to follow along and participate in "chit chat" very well. When asked if his lunch had arrived, however, (which was sitting near the sink in his room) pt reported no and "when I find my wallet later, I'll go get something." Pt appears to lack insight and situational problem solving at times. During functional transfer training, pt required extensive time to initiate tasks after verbalizing he understood therapist and able to teach back what therapist was requesting of him. Max A x2 with bari/tall RW to stand from standard height surface. Cues to straighten legs. Orthostatic vitals taken during session (see chart), pt + orthostatic and symptomatic. Pt continues to benefit from skilled OT services. Continue to recommend CIR.   Follow Up  Recommendations  CIR    Equipment Recommendations  3 in 1 bedside commode(bari/tall 3:1)    Recommendations for Other Services      Precautions / Restrictions Precautions Precautions: Fall Precaution Comments: Pt has been cognitively impaired with poor awareness of this, not very vocal about dizziness, pain, need ot rest, etc. Orthostatic for 3+ days. Restrictions Weight Bearing Restrictions: Yes RLE Weight Bearing: Weight bearing as tolerated LLE Weight Bearing: Weight bearing as tolerated       Mobility Bed Mobility Overal bed mobility: Needs Assistance Bed Mobility: Supine to Sit;Sit to Supine     Supine to sit: Mod assist Sit to supine: +2 for safety/equipment;Max assist   General bed mobility comments: see PT noted for additional details  Transfers Overall transfer level: Needs assistance Equipment used: Rolling walker (2 wheeled);None(bari-tall RW) Transfers: Sit to/from Stand Sit to Stand: Max assist;+2 physical assistance;+2 safety/equipment(from standard surface height)         General transfer comment: pt stands for ~45-60 seconds, then has worsenign mentation/weakness; OT/PT facilitated return to sitting for BP check which is orthostatic. Sitting x3 minutes results in continued drop in BP.    Balance Overall balance assessment: Modified Independent;Mild deficits observed, not formally tested;History of Falls Sitting-balance support: No upper extremity supported;Feet supported;Bilateral upper extremity supported(after BP drop, minA for trunk support) Sitting balance-Leahy Scale: Fair  ADL either performed or assessed with clinical judgement   ADL Overall ADL's : Needs assistance/impaired                       Lower Body Dressing Details (indicate cue type and reason): pt able to complete donning of socks over feet, but requires Mod A to clear feet from floor to get sock started over foot                      Vision Baseline Vision/History: Wears glasses Wears Glasses: At all times Patient Visual Report: No change from baseline     Perception     Praxis      Cognition Arousal/Alertness: Awake/alert Behavior During Therapy: Flat affect(inititally more conversive and improved initiation, however this declines once BP drops) Overall Cognitive Status: Impaired/Different from baseline Area of Impairment: Memory;Following commands;Awareness;Attention;Problem solving;Orientation                 Orientation Level: Person;Place;Time Current Attention Level: Selective Memory: Decreased short-term memory Following Commands: Follows one step commands inconsistently;Follows one step commands with increased time Safety/Judgement: Decreased awareness of safety;Decreased awareness of deficits   Problem Solving: Slow processing;Decreased initiation;Requires verbal cues;Difficulty sequencing          Exercises  Other Exercises Other Exercises: set up and cues to initiate self feeding, poor appetite   Shoulder Instructions       General Comments Orthostatic BPs taken during session; pt + for orthostatic and symptomatic    Pertinent Vitals/ Pain       Pain Assessment: 0-10 Pain Score: 7  Faces Pain Scale: Hurts even more Pain Location: Bilat knees, R>L Pain Descriptors / Indicators: Sore;Discomfort;Grimacing;Crying Pain Intervention(s): Limited activity within patient's tolerance;Monitored during session;Repositioned;Ice applied  Home Living                                          Prior Functioning/Environment              Frequency  Min 3X/week        Progress Toward Goals  OT Goals(current goals can now be found in the care plan section)     Acute Rehab OT Goals Patient Stated Goal: to go home OT Goal Formulation: With patient Time For Goal Achievement: 10/09/18 Potential to Achieve Goals: [place] Discharge plan  remains appropriate;Frequency remains appropriate    Co-evaluation    PT/OT/SLP Co-Evaluation/Treatment: Yes Reason for Co-Treatment: Complexity of the patient's impairments (multi-system involvement);To address functional/ADL transfers;For patient/therapist safety PT goals addressed during session: Mobility/safety with mobility;Balance;Proper use of DME OT goals addressed during session: ADL's and self-care;Proper use of Adaptive equipment and DME      AM-PAC OT "6 Clicks" Daily Activity     Outcome Measure   Help from another person eating meals?: None Help from another person taking care of personal grooming?: None Help from another person toileting, which includes using toliet, bedpan, or urinal?: A Lot Help from another person bathing (including washing, rinsing, drying)?: A Lot Help from another person to put on and taking off regular upper body clothing?: A Little Help from another person to put on and taking off regular lower body clothing?: A Lot 6 Click Score: 17    End of Session Equipment Utilized During Treatment: Gait belt;Rolling walker  OT Visit Diagnosis: Other abnormalities of gait and mobility (  R26.89);History of falling (Z91.81);Pain Pain - Right/Left: Left Pain - part of body: Knee   Activity Tolerance Patient tolerated treatment well   Patient Left in bed;with call bell/phone within reach;with bed alarm set;Other (comment)(R polar care in place, upright for self feeding, B feet elevated)   Nurse Communication (orthostatic vitals)        Time: 4232-0094 OT Time Calculation (min): 37 min  Charges: OT General Charges $OT Visit: 1 Visit OT Treatments $Therapeutic Activity: 8-22 mins  Jeni Salles, MPH, MS, OTR/L ascom [phone] 10/05/18, 3:45 PM

## 2018-10-05 NOTE — Progress Notes (Signed)
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  L DVT Argatroban drip discontinued 10/03/2018  Allergies  Allergen Reactions  . 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 Lipitor [Atorvastatin] Hives and Itching  . Other   . Viagra  [Sildenafil Citrate]     vision loss    Patient Measurements: Height: 6\' 4"  (193 cm) Weight: (!) 343 lb 14.7 oz (156 kg) IBW/kg (Calculated) : 86.8  Vital Signs: Temp: 98.7 F (37.1 C) (06/18 0758) Temp Source: Oral (06/18 0758) BP: 137/78 (06/18 0758) Pulse Rate: 96 (06/18 0758)  Labs: Recent Labs    10/02/18 1037  10/03/18 0302 10/03/18 0827 10/04/18 0542 10/05/18 0506  HGB  --    < > 8.8*  --  8.2*  8.2* 7.8*  HCT  --   --  27.8*  --  26.6*  25.9* 24.3*  PLT  --   --  494*  --  472*  450* 354  APTT 80*  --  106* 93*  --   --   LABPROT  --   --  30.2*  --  22.2* 24.9*  INR  --   --  2.9*  --  2.0* 2.3*  CREATININE  --   --  8.38*  --  7.51* 6.63*  CKTOTAL  --   --   --   --   --  92   < > = values in this interval not displayed.    Estimated Creatinine Clearance: 19.2 mL/min (A) (by C-G formula based on SCr of 6.63 mg/dL (H)).   Medical History: Past Medical History:  Diagnosis Date  . Allergy   . Anxiety   . Arthritis    knees  . Asthma, mild intermittent, well-controlled   . Chronic obstructive asthma ([place])   . COPD (chronic obstructive pulmonary disease) ([place])   . Depression with anxiety   . Diabetes ([place])   . GERD (gastroesophageal reflux disease)   . HBP (high blood pressure)   . High cholesterol   . Renal cyst, right   . Sleep apnea    CPAP  . Swelling     Medications:  Medications Prior to Admission  Medication Sig Dispense Refill Last Dose  . acetaminophen (TYLENOL) 500 MG tablet Take 1 tablet (500 mg total) by mouth every 6 (six) hours as needed. 90 tablet 0 09/12/2018 at 0800  . albuterol (PROVENTIL HFA;VENTOLIN HFA)  108 (90 Base) MCG/ACT inhaler INHALE 2 PUFFS INTO THE LUNGS 4 (FOUR) TIMES DAILY. 6.7 Inhaler 0 09/12/2018 at 0800  . aspirin 81 MG tablet Take 81 mg by mouth daily.   09/12/2018 at 0800  . atenolol (TENORMIN) 25 MG tablet Take 1 tablet (25 mg total) by mouth every evening. 90 tablet 1 09/11/2018 at 2000  . cephALEXin (KEFLEX) 500 MG capsule Take 500 mg by mouth 3 (three) times daily.   09/12/2018 at 0800  . citalopram (CELEXA) 20 MG tablet Take 1 tablet (20 mg total) by mouth daily. 90 tablet 1 09/12/2018 at 0800  . fluticasone furoate-vilanterol (BREO ELLIPTA) 100-25 MCG/INH AEPB Inhale 1 puff into the lungs daily. 180 each 1 09/12/2018 at 0800  . Multiple Vitamins-Minerals (MENS MULTIVITAMIN PLUS) TABS Take by mouth.   09/12/2018 at 0800  . Omega-3 Fatty Acids (FISH OIL) 1000 MG CAPS Take by mouth daily.   09/12/2018 at 0800  . oxyCODONE-acetaminophen (PERCOCET/ROXICET) 5-325 MG tablet Take 1 tablet by mouth every 4 (  four) hours.   09/12/2018 at 0800  . rosuvastatin (CRESTOR) 20 MG tablet TAKE 1 TABLET BY MOUTH EVERY DAY 90 tablet 1 09/12/2018 at 0800  . SENNA-PLUS 8.6-50 MG tablet Take 2 tablets by mouth every morning.   09/12/2018 at 0800  . sulfamethoxazole-trimethoprim (BACTRIM DS) 800-160 MG tablet Take 1 tablet by mouth 2 (two) times a day.   09/12/2018 at 0800  . XARELTO 10 MG TABS tablet Take 10 mg by mouth daily.    09/12/2018 at 0800  . cetirizine (ZYRTEC) 10 MG tablet Take 10 mg by mouth daily.   prn at prn  . diclofenac sodium (VOLTAREN) 1 % GEL APPLY 4 GRAMS TOPICALLY FOUR TIMES A DAY 100 g 2 prn at prn  . lidocaine (XYLOCAINE) 2 % solution Use as directed 15 mLs in the mouth or throat as needed for mouth pain. 100 mL 0 prn at prn  . triamcinolone cream (KENALOG) 0.1 % triamcinolone acetonide 0.1 % topical cream   prn at prn    Assessment: 58 yr male had B/l knee replacements Sep 01, 2018 was admitted with fever, AKI/ Platelet count has been declining since 6/5. He was started on heparin 6/3.  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 Level is therapeutic. Levaquin started which can interact with warfarin. Will give patient warfarin 6 mg PO x 1 tonight and will continue to trend INRs and monitor w/ am labs. Patient on Levaquin and Daptomycin  Noralee Space, PharmD Clinical Pharmacist 10/05/2018 8:33 AM

## 2018-10-05 NOTE — Progress Notes (Addendum)
Physical Therapy Treatment Patient Details Name: Isaac Cross MRN: [id number] DOB: [birth year] Today's Date: 10/05/2018    History of Present Illness Pt is a 58 y.o. male presenting to hospital 09/12/18 after rolling OOB landing on L knee; pt with increased dizziness x2 days, fevers x3 days, and generalized weakness.  S/p B TKR 09/01/18 in [place] (pt reports discharging home same day).  Pt admitted with sepsis, acute renal failure, and B TKR.  Pt s/p L knee 5/31 and R knee 6/1 I&D with poly exchange.  Pt noted with severe encephalopathy from DT's with extensive ETOH abuse.  L LE DVT noted.  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  HD started 6/5 with L IJ temporary HD catheter.  PMH includes B TKR 09/01/18 in [place], DM, asthma, R vein surgery, COPD. Right IJ line removed 6/16. 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    PT Comments    Pt awake in bed upon entry, just got back from MRI per RN. Pt awake, alert, more conversational, makes more references to occurrences from prior day. Without cue, pt begins to speak to his motivations 1DA that precipitated a fall to floor, reporting a need to void urine, repeated feelings of shame and embarrassment having to ask nursing staff for assistance with voiding, and then began to fall from chair onto knees while reaching for urinal. Pt assisted with bed mobility and transfers in a capacity that best simulates ergonomics required for wife to provide care in the home. Pt requires maxA for bed mobility, maxA+2 for STS transfer with RW, minA for safety to maintain standing which is tolerated less than 60sec. Standing this date is less tolerated than seevral prior days, but of note knee pain bilat is significantly more limiting after his fall yesterday. Pt near syncopal with standing, and BP continues to drop even after sitting eOB x 3 minutes thereafter. Pt continues to make conversational references that are not appropriate to situation,  but seems to be have a late and very minimal awareness that these comments are nonsensical once oriented to place and situation. Pt left in bed with OT at bedside. MD/RN made aware of BP issues.    Follow Up Recommendations  CIR     Equipment Recommendations  3in1 (PT);Other (comment)    Recommendations for Other Services OT consult     Precautions / Restrictions Precautions Precautions: Fall Precaution Comments: Pt has been cognitively impaired with poor awareness of this, not very vocal about dizziness, pain, need ot rest, etc. Orthostatic for 3+ days. Restrictions RLE Weight Bearing: Weight bearing as tolerated LLE Weight Bearing: Weight bearing as tolerated    Mobility  Bed Mobility Overal bed mobility: Needs Assistance Bed Mobility: Supine to Sit;Sit to Supine     Supine to sit: Mod assist(PT assists with minA for each Leg 2/2 pain, single HHA offered, pt pulls self up with mod effort) Sit to supine: +2 for safety/equipment;Max assist(heavy assist of BLE into bed;)   General bed mobility comments: difficulty and high effort required to scoot hips forward to EOB  Transfers Overall transfer level: Needs assistance Equipment used: Rolling walker (2 wheeled);None(bari/tall RW) Transfers: Sit to/from Stand Sit to Stand: Max assist;+2 physical assistance;+2 safety/equipment(from standard surface height)         General transfer comment: pt stands for ~45-60 seconds, then has worsenign mentation/weakness; OT/PT facilitated return to sitting for BP check which is orthostatic. Sitting x3 minutes results in continued drop in BP.(significant pain standing,  multimodal cues for TKE for stability which he does.)  Ambulation/Gait                 Stairs             Wheelchair Mobility    Modified Rankin (Stroke Patients Only)       Balance Overall balance assessment: Modified Independent;Mild deficits observed, not formally tested;History of  Falls Sitting-balance support: No upper extremity supported;Feet supported;Bilateral upper extremity supported(after BP drop, minA for trunk support.)                                        Cognition Arousal/Alertness: Awake/alert Behavior During Therapy: Flat affect(inititally more conversive and improved initiation, however this declines once BP drops) Overall Cognitive Status: Impaired/Different from baseline Area of Impairment: Memory;Following commands;Awareness;Attention;Problem solving;Orientation                 Orientation Level: Person;Place;Time Current Attention Level: Selective Memory: Decreased short-term memory Following Commands: Follows one step commands inconsistently;Follows one step commands with increased time Safety/Judgement: Decreased awareness of safety;Decreased awareness of deficits   Problem Solving: Slow processing;Decreased initiation;Requires verbal cues;Difficulty sequencing        Exercises Total Joint Exercises Short Arc Quad: Strengthening;Both;Supine;15 reps;AAROM;Limitations Short Arc Quad Limitations: MinA requires for performance, improved activation from prior days Heel Slides: AAROM;Strengthening;Both;Supine;Limitations;10 reps Heel Slides Limitations: More limited by pain this date, likely realted to fall yesterday. Knees free of any bruising, dressings intact. has grimacing and pre-crying wth end range flexion, esp Left Hip ABduction/ADduction: AAROM;Strengthening;Both;Supine;15 reps;Limitations Hip Abduction/Adduction Limitations: MinA requires to perform.    General Comments        Pertinent Vitals/Pain Pain Assessment: Faces Faces Pain Scale: Hurts even more Pain Location: Bilat knees, R>L Pain Descriptors / Indicators: Sore;Discomfort;Grimacing;Crying Pain Intervention(s): Repositioned;Monitored during session    Home Living                      Prior Function            PT Goals (current  goals can now be found in the care plan section) Acute Rehab PT Goals Patient Stated Goal: to go home PT Goal Formulation: With patient Time For Goal Achievement: 10/13/18 Potential to Achieve Goals: Poor Progress towards PT goals: Not progressing toward goals - comment(limited by orthostatic vitals, otherwise seemed slightly more clear earlier)    Frequency    7X/week      PT Plan Current plan remains appropriate    Co-evaluation PT/OT/SLP Co-Evaluation/Treatment: Yes Reason for Co-Treatment: Complexity of the patient's impairments (multi-system involvement);Necessary to address cognition/behavior during functional activity;For patient/therapist safety;To address functional/ADL transfers PT goals addressed during session: Mobility/safety with mobility;Proper use of DME;Strengthening/ROM;Balance OT goals addressed during session: ADL's and self-care;Proper use of Adaptive equipment and DME;Strengthening/ROM      AM-PAC PT "6 Clicks" Mobility   Outcome Measure  Help needed turning from your back to your side while in a flat bed without using bedrails?: A Little Help needed moving from lying on your back to sitting on the side of a flat bed without using bedrails?: A Little Help needed moving to and from a bed to a chair (including a wheelchair)?: A Little Help needed standing up from a chair using your arms (e.g., wheelchair or bedside chair)?: A Little Help needed to walk in hospital room?: A Lot Help needed climbing 3-5 steps with  a railing? : Total 6 Click Score: 15    End of Session Equipment Utilized During Treatment: Gait belt Activity Tolerance: Patient limited by pain;Treatment limited secondary to medical complications (Comment);Other (comment)(Orthostatic with cognitive decline thereafter) Patient left: with call bell/phone within reach;in bed;Other (comment);with nursing/sitter in room(OT in room) Nurse Communication: Mobility status PT Visit Diagnosis: Other  abnormalities of gait and mobility (R26.89);Muscle weakness (generalized) (M62.81);Difficulty in walking, not elsewhere classified (R26.2);Pain Pain - Right/Left: (bilat knees) Pain - part of body: Knee     Time: 1444-5848 PT Time Calculation (min) (ACUTE ONLY): 34 min  Charges:  $Therapeutic Exercise: 23-37 mins                     2:54 PM, 10/05/18 Etta Grandchild, PT, DPT Physical Therapist - [facility] [place]  [phone] ([place])     [place] C 10/05/2018, 2:54 PM

## 2018-10-06 ENCOUNTER — Inpatient Hospital Stay: Payer: BC Managed Care – PPO

## 2018-10-06 DIAGNOSIS — E271 Primary adrenocortical insufficiency: Secondary | ICD-10-CM

## 2018-10-06 DIAGNOSIS — E272 Addisonian crisis: Secondary | ICD-10-CM

## 2018-10-06 LAB — BASIC METABOLIC PANEL
Anion gap: 9 (ref 5–15)
BUN: 31 mg/dL — ABNORMAL HIGH (ref 6–20)
CO2: 25 mmol/L (ref 22–32)
Calcium: 8.2 mg/dL — ABNORMAL LOW (ref 8.9–10.3)
Chloride: 101 mmol/L (ref 98–111)
Creatinine, Ser: 5.17 mg/dL — ABNORMAL HIGH (ref 0.61–1.24)
GFR calc Af Amer: 13 mL/min — ABNORMAL LOW (ref >60)
GFR calc non Af Amer: 11 mL/min — ABNORMAL LOW (ref >60)
Glucose, Bld: 137 mg/dL — ABNORMAL HIGH (ref 70–99)
Potassium: 3.6 mmol/L (ref 3.5–5.1)
Sodium: 135 mmol/L (ref 135–145)

## 2018-10-06 LAB — GLUCOSE, CAPILLARY
Glucose-Capillary: 105 mg/dL — ABNORMAL HIGH (ref 70–99)
Glucose-Capillary: 117 mg/dL — ABNORMAL HIGH (ref 70–99)
Glucose-Capillary: 107 mg/dL — ABNORMAL HIGH (ref 70–99)
Glucose-Capillary: 139 mg/dL — ABNORMAL HIGH (ref 70–99)

## 2018-10-06 LAB — PROTEIN ELECTROPHORESIS, SERUM
A/G Ratio: 0.5 — ABNORMAL LOW (ref 0.7–1.7)
Albumin ELP: 2.1 g/dL — ABNORMAL LOW (ref 2.9–4.4)
Alpha-1-Globulin: 0.5 g/dL — ABNORMAL HIGH (ref 0.0–0.4)
Alpha-2-Globulin: 1.1 g/dL — ABNORMAL HIGH (ref 0.4–1.0)
Beta Globulin: 0.7 g/dL (ref 0.7–1.3)
Gamma Globulin: 1.8 g/dL (ref 0.4–1.8)
Globulin, Total: 4.1 g/dL — ABNORMAL HIGH (ref 2.2–3.9)
Total Protein ELP: 6.2 g/dL (ref 6.0–8.5)

## 2018-10-06 LAB — PROTIME-INR
INR: 2.1 — ABNORMAL HIGH (ref 0.8–1.2)
Prothrombin Time: 23.4 seconds — ABNORMAL HIGH (ref 11.4–15.2)

## 2018-10-06 LAB — RPR: RPR Ser Ql: NONREACTIVE

## 2018-10-06 LAB — RHEUMATOID FACTOR: Rheumatoid fact SerPl-aCnc: <10 IU/mL (ref 0.0–13.9)

## 2018-10-06 IMAGING — MR MRI ABDOMEN WITHOUT CONTRAST
12 series · 48 of 48 positions shown · non-contrast
Comparison: [DATE] and [DATE]

CLINICAL DATA: Adrenal masses.

EXAM:
MRI ABDOMEN WITHOUT CONTRAST
TECHNIQUE: Multiplanar multisequence MR imaging was performed without the
administration of intravenous contrast.

[Series 2: cor haste · coronal · 6.0mm · 1.19mm/px · 2 of 33 slices shown]
[im 1/33]
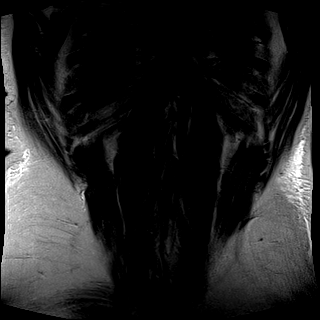
[im 33/33]
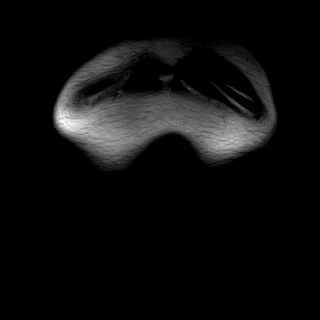

[Series 3: ax haste · axial · 6.0mm · 1.19mm/px · z∈[-129,+80]mm · 2 of 30 slices shown]
[im 1/30]
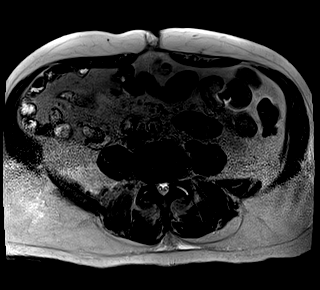
[im 30/30]
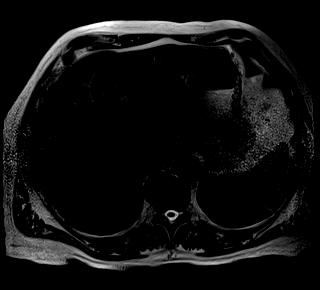

[Series 6: T2 fat-sat · axial · 6.0mm · 1.19mm/px · z∈[-125,+84]mm · 2 of 30 slices shown]
[im 1/30]
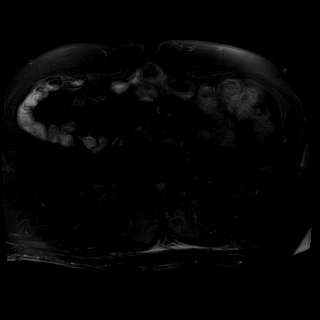
[im 30/30]
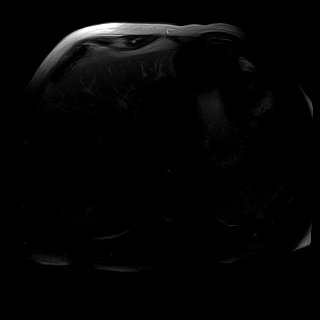

[Series 7: DWI · axial · 6.0mm · 1.42mm/px · z∈[-125,+84]mm · 3 of 30 slices shown (1 of 4)]
[im 1/30]
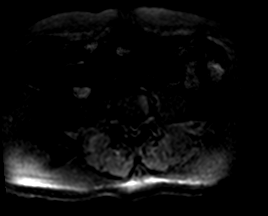
[im 15/30]
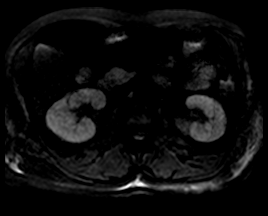
[im 30/30]
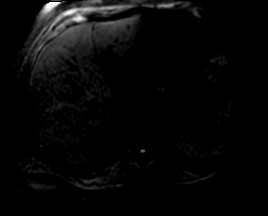

[Series 7: DWI · axial · 6.0mm · 1.42mm/px · z∈[-125,+84]mm · 3 of 30 slices shown (2 of 4)]
[im 1/30]
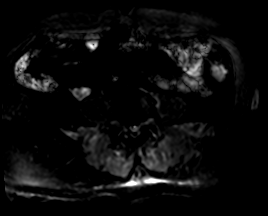
[im 15/30]
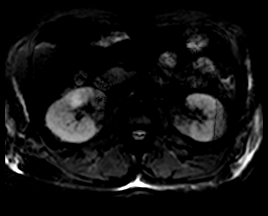
[im 30/30]
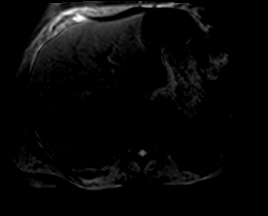

[Series 7: DWI · axial · 6.0mm · 1.42mm/px · z∈[-125,+84]mm · 3 of 30 slices shown (3 of 4)]
[im 1/30]
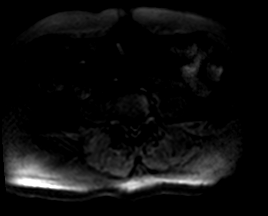
[im 15/30]
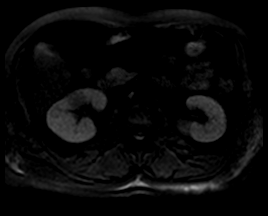
[im 30/30]
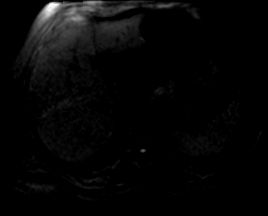

[Series 8: DWI · axial · 6.0mm · 1.42mm/px · z∈[-125,+84]mm · 3 of 30 slices shown (4 of 4)]
[im 1/30]
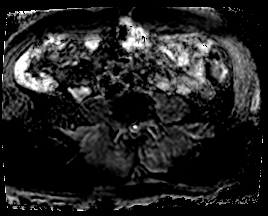
[im 15/30]
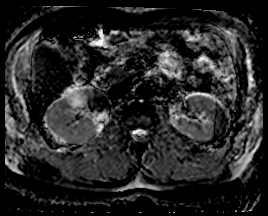
[im 30/30]
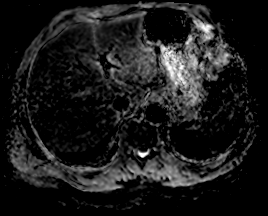

[Series 9: axial in-opp · axial · 3.0mm · 1.19mm/px · z∈[-131,+82]mm · 6 of 72 slices shown (1 of 2)]
[im 1/72]
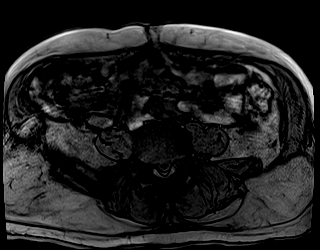
[im 15/72]
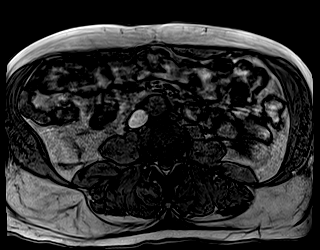
[im 29/72]
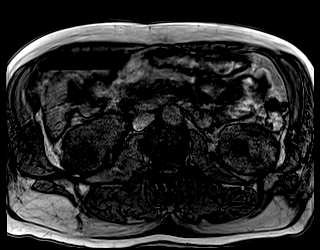
[im 43/72]
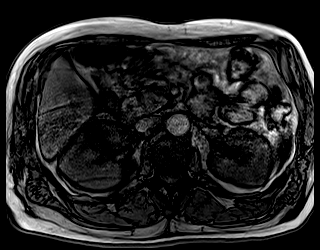
[im 57/72]
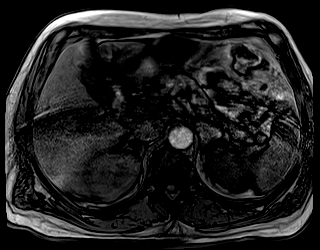
[im 72/72]
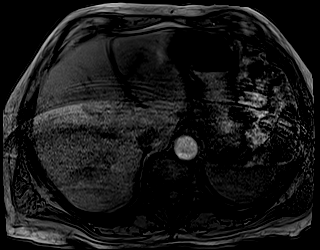

[Series 9: axial in-opp · axial · 3.0mm · 1.19mm/px · z∈[-131,+82]mm · 6 of 72 slices shown (2 of 2)]
[im 1/72]
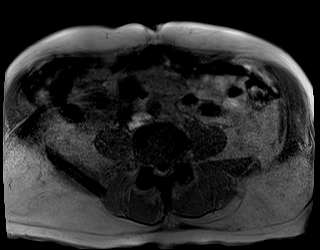
[im 15/72]
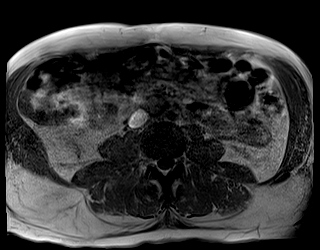
[im 29/72]
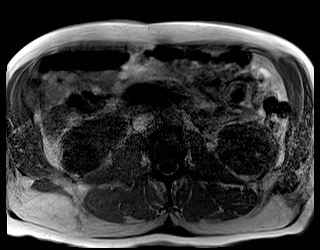
[im 43/72]
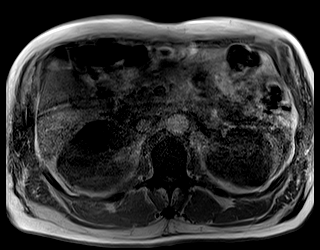
[im 57/72]
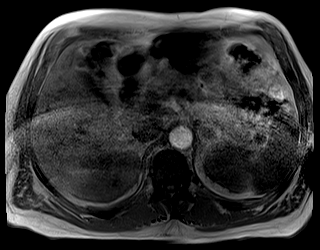
[im 72/72]
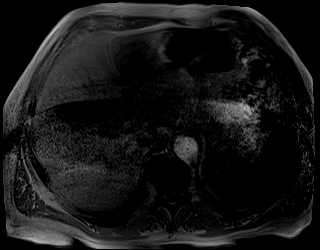

[Series 10: cor in-opp · coronal · 3.0mm · 1.19mm/px · 6 of 72 slices shown (1 of 2)]
[im 1/72]
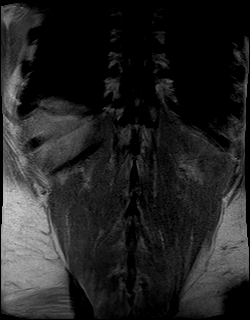
[im 15/72]
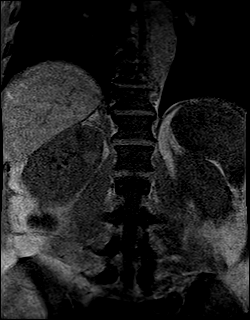
[im 29/72]
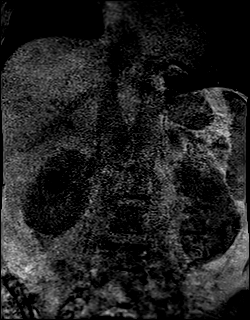
[im 43/72]
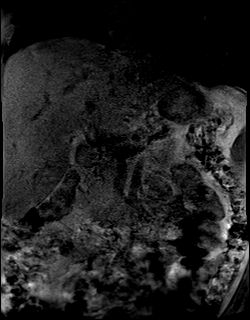
[im 57/72]
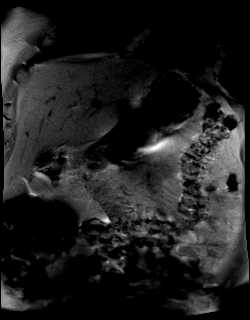
[im 72/72]
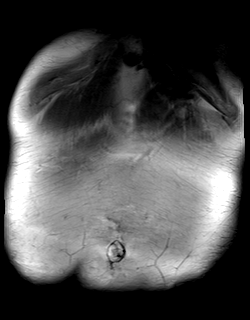

[Series 10: cor in-opp · coronal · 3.0mm · 1.19mm/px · 6 of 72 slices shown (2 of 2)]
[im 1/72]
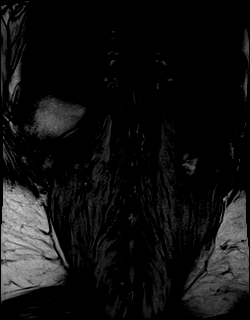
[im 15/72]
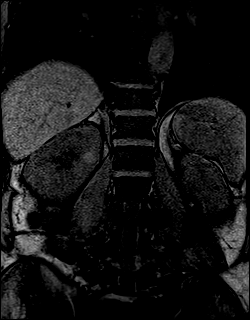
[im 29/72]
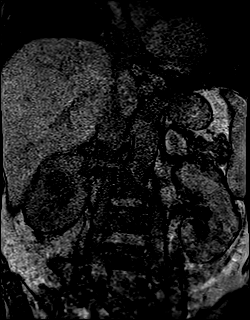
[im 43/72]
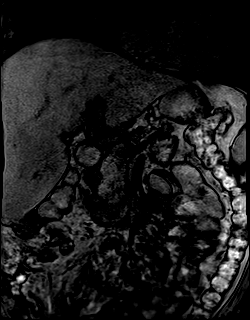
[im 57/72]
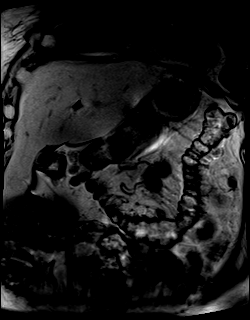
[im 72/72]
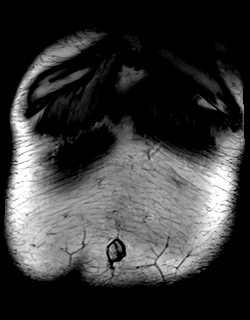

[Series 11: t1_[id number]_pre · axial · 3.0mm · 1.19mm/px · z∈[-137,+76]mm · 6 of 72 slices shown]
[im 1/72]
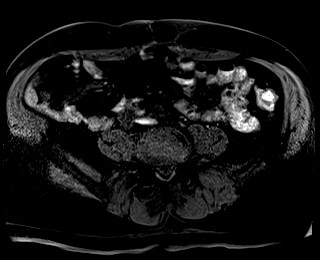
[im 15/72]
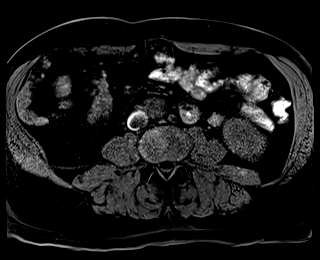
[im 29/72]
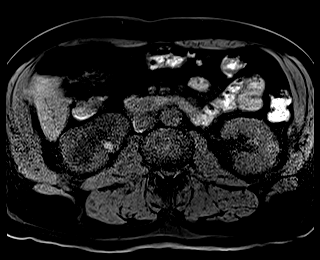
[im 43/72]
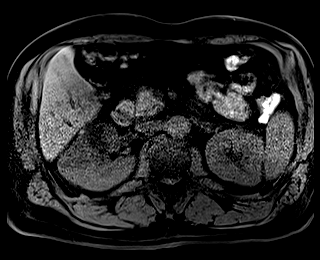
[im 57/72]
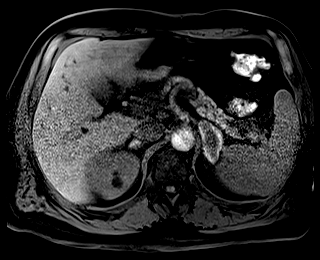
[im 72/72]
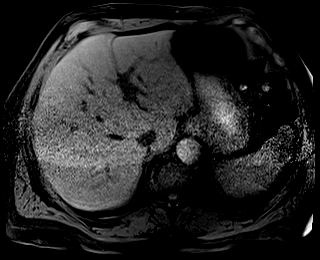

[48 of 48 positions shown; findings below may reference images not displayed]

FINDINGS: Lower chest: No acute findings.

Hepatobiliary: No masses visualized on this unenhanced exam.
Gallbladder is unremarkable. No evidence of biliary ductal
dilatation.

Pancreas: No mass or inflammatory process visualized on this
unenhanced exam.

Spleen:  Within normal limits in size.

Adrenals/Urinary tract: 5.1 x 2.5 cm left adrenal mass is seen which
shows T2 hyperintensity, and a peripheral rim of T1 hyperintensity
and T2 hypointensity. This shows no evidence of signal dropout on
chemical shift imaging, and has nonspecific characteristics. This is
new compared to [F2] exam, and differential diagnosis includes
nodular hyperplasia, subacute adrenal hematomas, lipid poor
adenomas, and metastases.

Masslike thickening in the right adrenal gland measures 3.8 x
cm. This lesion shows T1 and T2 hyperintensity and is also new since
[F2] exam. No signal dropout is seen on chemical shift imaging.
These are nonspecific characteristics.

Fluid attenuation renal cysts are noted bilaterally. No evidence of
renal mass or hydronephrosis.

Stomach/Bowel: Visualized portion unremarkable.

Vascular/Lymphatic: No pathologically enlarged lymph nodes
identified. No evidence of abdominal aortic aneurysm.

Other:  None.

Musculoskeletal:  No suspicious bone lesions identified.
IMPRESSION: Bilateral adrenal mass, both new since [F2] but with nonspecific
imaging characteristics. Differential diagnosis includes nodular
hyperplasia, subacute adrenal hematomas, lipid poor adenomas, and
metastatic disease. In patient without history of cancer, consider
biochemical assays to determine functional status, as well as
surgical resection. If patient has cancer history, consider biopsy
or PET-CT.

This recommendation follows ACR consensus guidelines: Management of
Incidental Adrenal Masses: A White Paper of the ACR Incidental
Findings Committee. [HOSPITAL] [F2];14:[PHONE_NUMBER].

## 2018-10-06 MED ORDER — HYDROCORTISONE 10 MG PO TABS
20.0000 mg | ORAL_TABLET | Freq: Two times a day (BID) | ORAL | Status: DC
  Administered 2018-10-06 – 2018-10-11 (×10): 20 mg via ORAL
  Filled 2018-10-06 (×11): qty 2

## 2018-10-06 MED ORDER — CITALOPRAM HYDROBROMIDE 20 MG PO TABS
10.0000 mg | ORAL_TABLET | Freq: Every day | ORAL | Status: DC
  Administered 2018-10-06 – 2018-10-12 (×7): 10 mg via ORAL
  Filled 2018-10-06 (×3): qty 1
  Filled 2018-10-06: qty 0.5
  Filled 2018-10-06 (×3): qty 1

## 2018-10-06 MED ORDER — LEVOFLOXACIN IN D5W 750 MG/150ML IV SOLN
750.0000 mg | INTRAVENOUS | Status: DC
  Administered 2018-10-06 – 2018-10-08 (×2): 750 mg via INTRAVENOUS
  Filled 2018-10-06 (×2): qty 150

## 2018-10-06 MED ORDER — WARFARIN SODIUM 6 MG PO TABS
6.0000 mg | ORAL_TABLET | Freq: Once | ORAL | Status: AC
  Administered 2018-10-06: 6 mg via ORAL
  Filled 2018-10-06: qty 1

## 2018-10-06 NOTE — Progress Notes (Signed)
[facility] [place] NAME: Isaac Cross    MR#:  [id number]  DATE OF BIRTH:  [birth year]  SUBJECTIVE:   Awake and alert.  REVIEW OF SYSTEMS:  Review of Systems  Constitutional: Positive for malaise/fatigue. Negative for chills, fever and weight loss.  HENT: Negative for ear discharge, ear pain and nosebleeds.   Eyes: Negative for blurred vision, pain and discharge.  Respiratory: Negative for sputum production, shortness of breath, wheezing and stridor.   Cardiovascular: Negative for chest pain, palpitations, orthopnea and PND.  Gastrointestinal: Negative for abdominal pain, diarrhea, nausea and vomiting.  Genitourinary: Negative for frequency and urgency.  Musculoskeletal: Negative for back pain and joint pain.  Neurological: Negative for sensory change, speech change, focal weakness and weakness.  Psychiatric/Behavioral: Negative for depression and hallucinations. The patient is not nervous/anxious.    DRUG ALLERGIES:   Allergies  Allergen Reactions  . 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 Lipitor [Atorvastatin] Hives and Itching  . Other   . Viagra  [Sildenafil Citrate]     vision loss    VITALS:  Blood pressure 135/87, pulse 93, temperature 98.5 F (36.9 C), temperature source Oral, resp. rate 17, height 6\' 4"  (1.93 m), weight (!) 156 kg, SpO2 100 %.  PHYSICAL EXAMINATION:  Physical Exam   GENERAL:  58 y.o.-year-old patient lying in the bed with no acute distress. Critically ill appearing EYES: Pupils equal, round, reactive to light and accommodation. No scleral icterus.  HEENT: Head atraumatic, normocephalic. Oropharynx and nasopharynx clear.  NECK:  Supple, no jugular venous distention. No thyroid enlargement, no tenderness.  LUNGS: Normal breath sounds bilaterally, no wheezing, rales,rhonchi or crepitation. No use of accessory muscles of respiration.  Decreased bibasilar breath sounds  CARDIOVASCULAR: S1, S2 normal. No murmurs, rubs, or gallops. ABDOMEN: Soft, nontender, nondistended. Bowel sounds present. No organomegaly or mass. EXTREMITIES: Swelling of both legs, bilateral knee dressing.  No  cyanosis, or clubbing.  NEUROLOGIC: Cranial nerves II through XII are intact. Moving all extremities well PSYCHIATRIC: patient is alert and oriented x3 SKIN: No obvious rash, lesion, or ulcer.   LABORATORY PANEL:   CBC Recent Labs  Lab 10/05/18 0506  WBC 8.3  HGB 7.8*  HCT 24.3*  PLT 354   ------------------------------------------------------------------------------------------------------------------  Chemistries  Recent Labs  Lab 09/29/18 1642  10/02/18 0506  10/06/18 0611  NA  --    < > 137   < > 135  K  --    < > 3.4*   < > 3.6  CL  --    < > 99   < > 101  CO2  --    < > 24   < > 25  GLUCOSE  --    < > 106*   < > 137*  BUN  --    < > 36*   < > 31*  CREATININE  --    < > 8.63*   < > 5.17*  CALCIUM  --    < > 8.3*   < > 8.2*  MG  --    < > 1.9  --   --   AST 38  --   --   --   --   ALT 22  --   --   --   --   ALKPHOS 58  --   --   --   --   BILITOT 0.7  --   --   --   --    < > =  values in this interval not displayed.   ------------------------------------------------------------------------------------------------------------------  Cardiac Enzymes No results for input(s): TROPONINI in the last 168 hours. ------------------------------------------------------------------------------------------------------------------  RADIOLOGY:  Ct Abdomen Pelvis Wo Contrast  Result Date: 10/04/2018 CLINICAL DATA:  Fever of unknown origin. EXAM: CT CHEST, ABDOMEN AND PELVIS WITHOUT CONTRAST TECHNIQUE: Multidetector CT imaging of the chest, abdomen and pelvis was performed following the standard protocol without IV contrast. COMPARISON:  Abdomen pelvis CT 04/30/2015 FINDINGS: CT CHEST FINDINGS Cardiovascular: The heart size is normal. No substantial pericardial  effusion. Coronary artery calcification is evident. No thoracic aortic aneurysm. Mediastinum/Nodes: No mediastinal lymphadenopathy. No evidence for gross hilar lymphadenopathy although assessment is limited by the lack of intravenous contrast on today's study. The esophagus has normal imaging features. There is no axillary lymphadenopathy. Lungs/Pleura: Patchy areas of ground-glass attenuation are identified in the lungs bilaterally. Subsegmental atelectasis noted in the dependent lower lobes bilaterally. No dense focal airspace consolidation. No pleural effusion. Musculoskeletal: No worrisome lytic or sclerotic osseous abnormality. CT ABDOMEN PELVIS FINDINGS Hepatobiliary: The liver shows diffusely decreased attenuation suggesting steatosis. There is no evidence for gallstones, gallbladder wall thickening, or pericholecystic fluid. No intrahepatic or extrahepatic biliary dilation. Pancreas: No focal mass lesion. No dilatation of the main duct. No intraparenchymal cyst. No peripancreatic edema. Spleen: No splenomegaly. No focal mass lesion. Adrenals/Urinary Tract: 5.2 x 2.6 cm left adrenal mass is new since a chest CT of 07/28/2017. This mass has attenuation too high to allow classification as an adenoma. 3.2 cm low-density lesion in the interpolar right kidney approaches water density and is unchanged in the interval, likely a cyst. Similar exophytic water density lesion in the lower pole right kidney measures 5 cm and is likely a cyst. No evidence for hydroureter. Urinary bladder wall is mildly thickened and ill-defined. Tiny gas bubble in the bladder lumen noted. 4.1 x 2.3 cm nodule in the right adrenal gland is also new in the interval. This has low attenuation suggesting that it represents an adenoma. Stomach/Bowel: Stomach is unremarkable. No gastric wall thickening. No evidence of outlet obstruction. Duodenum is normally positioned as is the ligament of Treitz. No small bowel wall thickening. No small bowel  dilatation. The terminal ileum is normal. No gross colonic mass. No colonic wall thickening. Diverticuli are seen scattered along the entire length of the colon without CT findings of diverticulitis. Vascular/Lymphatic: There is abdominal aortic atherosclerosis without aneurysm. There is no gastrohepatic or hepatoduodenal ligament lymphadenopathy. No intraperitoneal or retroperitoneal lymphadenopathy. Upper normal to borderline enlarged lymph nodes are seen in the external iliac pelvic sidewall bilaterally. Upper normal groin nodes are evident bilaterally. Reproductive: The prostate gland and seminal vesicles are unremarkable. Other: No intraperitoneal free fluid. Musculoskeletal: No worrisome lytic or sclerotic osseous abnormality. Small bilateral groin hernias contain only fat. IMPRESSION: 1. Mild circumferential bladder wall thickening with ill-defined bladder wall thickening. Infection/inflammation would be a consideration. Tiny gas bubble in the bladder lumen is presumably related to recent instrumentation although bladder infection could have this appearance. 2. New 4.1 x 2.3 cm right adrenal nodule with low attenuation most suggestive of benign adrenal adenoma. 5.2 x 2.6 cm left adrenal also mass new since 07/28/2017. This has attenuation too high to allow classification as an adenoma on this noncontrast CT. Follow-up CT could be used to ensure stability or MRI of the abdomen without and with contrast could be used to further evaluate for lipid poor adenoma. 3. Patchy areas of ground-glass attenuation in both lungs. Imaging features are nonspecific and may be related  to an infectious or inflammatory alveolitis. 4. Upper normal to borderline enlarged lymph nodes in the external iliac chains bilaterally, nonspecific but potentially reactive. 5. Hepatic steatosis. 6. Bilateral renal cysts. Electronically Signed   By: Misty Stanley M.D.   On: 10/04/2018 17:51   Ct Head Wo Contrast  Result Date: 10/04/2018  CLINICAL DATA:  Dizziness and weakness.  Fall. EXAM: CT HEAD WITHOUT CONTRAST TECHNIQUE: Contiguous axial images were obtained from the base of the skull through the vertex without intravenous contrast. COMPARISON:  Head CT 09/14/2018 FINDINGS: Brain: There is no mass, hemorrhage or extra-axial collection. The size and configuration of the ventricles and extra-axial CSF spaces are normal. The brain parenchyma is normal, without acute or chronic infarction. Vascular: No abnormal hyperdensity of the major intracranial arteries or dural venous sinuses. No intracranial atherosclerosis. Skull: The visualized skull base, calvarium and extracranial soft tissues are normal. Sinuses/Orbits: Small amount of fluid at the left mastoid tip. No middle ear effusion. Paranasal sinuses are clear. The orbits are normal. IMPRESSION: Normal brain. Electronically Signed   By: Ulyses Jarred M.D.   On: 10/04/2018 15:04   Ct Chest Wo Contrast  Result Date: 10/04/2018 CLINICAL DATA:  Fever of unknown origin. EXAM: CT CHEST, ABDOMEN AND PELVIS WITHOUT CONTRAST TECHNIQUE: Multidetector CT imaging of the chest, abdomen and pelvis was performed following the standard protocol without IV contrast. COMPARISON:  Abdomen pelvis CT 04/30/2015 FINDINGS: CT CHEST FINDINGS Cardiovascular: The heart size is normal. No substantial pericardial effusion. Coronary artery calcification is evident. No thoracic aortic aneurysm. Mediastinum/Nodes: No mediastinal lymphadenopathy. No evidence for gross hilar lymphadenopathy although assessment is limited by the lack of intravenous contrast on today's study. The esophagus has normal imaging features. There is no axillary lymphadenopathy. Lungs/Pleura: Patchy areas of ground-glass attenuation are identified in the lungs bilaterally. Subsegmental atelectasis noted in the dependent lower lobes bilaterally. No dense focal airspace consolidation. No pleural effusion. Musculoskeletal: No worrisome lytic or  sclerotic osseous abnormality. CT ABDOMEN PELVIS FINDINGS Hepatobiliary: The liver shows diffusely decreased attenuation suggesting steatosis. There is no evidence for gallstones, gallbladder wall thickening, or pericholecystic fluid. No intrahepatic or extrahepatic biliary dilation. Pancreas: No focal mass lesion. No dilatation of the main duct. No intraparenchymal cyst. No peripancreatic edema. Spleen: No splenomegaly. No focal mass lesion. Adrenals/Urinary Tract: 5.2 x 2.6 cm left adrenal mass is new since a chest CT of 07/28/2017. This mass has attenuation too high to allow classification as an adenoma. 3.2 cm low-density lesion in the interpolar right kidney approaches water density and is unchanged in the interval, likely a cyst. Similar exophytic water density lesion in the lower pole right kidney measures 5 cm and is likely a cyst. No evidence for hydroureter. Urinary bladder wall is mildly thickened and ill-defined. Tiny gas bubble in the bladder lumen noted. 4.1 x 2.3 cm nodule in the right adrenal gland is also new in the interval. This has low attenuation suggesting that it represents an adenoma. Stomach/Bowel: Stomach is unremarkable. No gastric wall thickening. No evidence of outlet obstruction. Duodenum is normally positioned as is the ligament of Treitz. No small bowel wall thickening. No small bowel dilatation. The terminal ileum is normal. No gross colonic mass. No colonic wall thickening. Diverticuli are seen scattered along the entire length of the colon without CT findings of diverticulitis. Vascular/Lymphatic: There is abdominal aortic atherosclerosis without aneurysm. There is no gastrohepatic or hepatoduodenal ligament lymphadenopathy. No intraperitoneal or retroperitoneal lymphadenopathy. Upper normal to borderline enlarged lymph nodes are seen in the  external iliac pelvic sidewall bilaterally. Upper normal groin nodes are evident bilaterally. Reproductive: The prostate gland and seminal  vesicles are unremarkable. Other: No intraperitoneal free fluid. Musculoskeletal: No worrisome lytic or sclerotic osseous abnormality. Small bilateral groin hernias contain only fat. IMPRESSION: 1. Mild circumferential bladder wall thickening with ill-defined bladder wall thickening. Infection/inflammation would be a consideration. Tiny gas bubble in the bladder lumen is presumably related to recent instrumentation although bladder infection could have this appearance. 2. New 4.1 x 2.3 cm right adrenal nodule with low attenuation most suggestive of benign adrenal adenoma. 5.2 x 2.6 cm left adrenal also mass new since 07/28/2017. This has attenuation too high to allow classification as an adenoma on this noncontrast CT. Follow-up CT could be used to ensure stability or MRI of the abdomen without and with contrast could be used to further evaluate for lipid poor adenoma. 3. Patchy areas of ground-glass attenuation in both lungs. Imaging features are nonspecific and may be related to an infectious or inflammatory alveolitis. 4. Upper normal to borderline enlarged lymph nodes in the external iliac chains bilaterally, nonspecific but potentially reactive. 5. Hepatic steatosis. 6. Bilateral renal cysts. Electronically Signed   By: Misty Stanley M.D.   On: 10/04/2018 17:51   Mr Brain Wo Contrast  Result Date: 10/05/2018 CLINICAL DATA:  Altered level of consciousness. EXAM: MRI HEAD WITHOUT CONTRAST TECHNIQUE: Multiplanar, multiecho pulse sequences of the brain and surrounding structures were obtained without intravenous contrast. COMPARISON:  CT head 10/04/2018 FINDINGS: Brain: Ventricle size and cerebral volume normal. Negative for acute infarct, hemorrhage, mass. Scattered small white matter hyperintensities bilaterally likely due to microvascular ischemia. Vascular: Normal arterial flow voids Skull and upper cervical spine: Negative Sinuses/Orbits: Mild mucosal edema paranasal sinuses. Left mastoid effusion. Normal  orbit. Other: None IMPRESSION: No acute abnormality. Mild chronic white matter changes likely due to microvascular ischemia. Electronically Signed   By: Franchot Gallo M.D.   On: 10/05/2018 14:36    EKG:   Orders placed or performed during the hospital encounter of 09/12/18  . EKG 12-Lead  . EKG 12-Lead  . EKG 12-Lead  . EKG 12-Lead  . EKG 12-Lead  . EKG 12-Lead    ASSESSMENT AND PLAN:    EdwardGradyis a58 y.o.malewith a known history of arthritis, COPD not on home oxygen, hypertension, sleep apnea, non-insulin-dependent diabetes mellitus presents to hospital secondary to dizziness, weakness and a fall  * Sepsis -s/p Recent bilateral knee replacement surgeries as outpatient and now bilateral knee washouts done this admission - Fluid Cultures negative.  But patient was on antibiotics prior to his washout and sampling - Fungal cultures negative -Patient was on zosyn, doxycycline --now on ceftazidime and Levaquin (last dose 10/31/2018) -completed 7 days of anidulafungin   CT chest abd with urinary bladder wall thickening and diffuse interstitial changes on bilateral lungs.  Sent message to [facility].  Patient had bladder surgery in the past and recent UTI. UA showing no UTI. Afebrile today  *Acute non- Oliguric renal failure-likely ATN from sepsis/hypotension - appreciate nephrology input. -Hemodialysis started 09/22/2018 -Hold lisinopril and metformin and other nephrotoxins.    The patient has good urine output, creatinine down to 5 -On IV fluids.  Discussed with Dr. Juleen China.  * Acute encephalopathy-secondary to metabolic causes, sepsis and uremia. -No focal deficits Patient also went through delirium tremens and ICU delirium MRi Brain - Nothing acute Consulted neurology Dr. Melonie Florida. Appreciate input.  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  * DVT prophylaxis- argatroban drip and  coumadin  *Primary adrenal insufficiency.  Low cortisol level.  High ACTH level.  No response to cosyntropin dose. 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  Started on Florinef and hydrocortisone  * Anemia of chronic disease.  Stable.  * Hypokalemia.  Potassium supplemented  * Hypomagnesemia.  Supplemented  Called and discussed with wife.  Updated on plan of care.  Answered all questions.  CODE STATUS: Full code  TOTAL TIME TAKING CARE OF THIS PATIENT: 35 minutes.   Discharge to skilled nursing facility versus inpatient rehab in 3 to 4 days  Neita Carp M.D on 10/06/2018 at 11:09 AM  Between 7am to 6pm - Pager - [phone]  After 6pm go to [email or web address] - password [email or web address] [facility]  Office  [phone]  CC: Primary care physician; Steele Sizer, MD

## 2018-10-06 NOTE — Progress Notes (Signed)
Pt remains very orthostatic with acute onset weakness and loss of mental clarity. Orthostatic vitals taken during Physical Therapy session this date. RN/MD made aware.     10/06/18 1040  Therapy Vitals  Patient Position (if appropriate) Orthostatic Vitals  Orthostatic Lying   BP- Lying 122/80  Pulse- Lying 96  Orthostatic Sitting  BP- Sitting (!) 108/93  Pulse- Sitting 93  Orthostatic Standing at 0 minutes  BP- Standing at 0 minutes (!) 81/67 (s/p 2 feet AMB, acute onset dizziness/weakness)  Pulse- Standing at 0 minutes 110     Below provides more detail regarding activity/BP:  Position BP (mmHG)  HR (bpm)  Symptoms  Supine 122/80 96 none  seated  108/93 93 none  seated @ 90 sec 127/72 100 none  s/p pivot transfer to chair 114/82 104 none  s/p AMB attempt (2 feet)  81/67 110 dizzy, c mentation change  Feet elevated, pt reclined 110/71 106 dizzy, c mentation change  *additional drop at end of session after pivot transfer from [facility] to chair 80s/60s mmHg.   2:46 PM, 10/06/18 Etta Grandchild, PT, DPT Physical Therapist - [facility]  [phone] [facility])

## 2018-10-06 NOTE — Progress Notes (Signed)
Date of Admission:  09/12/2018     Subjective: Doing much better More alert   Medications:  . aspirin EC  81 mg Oral Daily  . Chlorhexidine Gluconate Cloth  6 each Topical Q0600  . feeding supplement (NEPRO CARB STEADY)  237 mL Oral BID BM  . fludrocortisone  0.1 mg Oral Daily  . fluticasone furoate-vilanterol  1 puff Inhalation Daily  . folic acid  1 mg Oral Daily  . hydrocortisone  10 mg Oral BID  . insulin aspart  0-5 Units Subcutaneous QHS  . insulin aspart  0-9 Units Subcutaneous TID WC  . mouth rinse  15 mL Mouth Rinse BID  . multivitamin with minerals  1 tablet Oral Daily  . omega-3 acid ethyl esters  1 g Oral Daily  . sodium chloride flush  3 mL Intravenous Q12H  . warfarin  6 mg Oral ONCE-1800  . Warfarin - Pharmacist Dosing Inpatient   Does not apply q1800    Objective: Vital signs in last 24 hours: Temp:  [98.5 F (36.9 C)-100.1 F (37.8 C)] 98.5 F (36.9 C) (06/19 0814) Pulse Rate:  [90-96] 93 (06/19 0814) Resp:  [17-19] 17 (06/19 0814) BP: (135-141)/(87-98) 135/87 (06/19 0814) SpO2:  [92 %-100 %] 100 % (06/19 0814)  PHYSICAL EXAM:  General: Alert, cooperative, no distress, appears stated age.  Lungs: Clear to auscultation bilaterally. No Wheezing or Rhonchi. No rales. Heart: s1s2 Abdomen: Soft,  Extremities: b/l knee sugical site- covered with dressing Skin: No rashes or lesions. Or bruising Lymph: Cervical, supraclavicular normal. Neurologic: Grossly non-focal  Lab Results Recent Labs    10/04/18 0542 10/05/18 0506 10/06/18 0611  WBC 10.2  10.4 8.3  --   HGB 8.2*  8.2* 7.8*  --   HCT 26.6*  25.9* 24.3*  --   NA 138 137 135  K 3.4* 3.1* 3.6  CL 102 103 101  CO2 22 21* 25  BUN 37* 35* 31*  CREATININE 7.51* 6.63* 5.17*   Liver Panel No results for input(s): PROT, ALBUMIN, AST, ALT, ALKPHOS, BILITOT, BILIDIR, IBILI in the last 72 hours. Sedimentation Rate Recent Labs    10/04/18 0542  ESRSEDRATE 127*    Immunology and other work  up for fever ANA 09/12/18-& 09/19/18 =Neg ACE level 09/19/18 NEg Complement c4/C3 Neg 09/28/18 ANCA neg 09/28/18 Beta D glucan 09/20/18-Neg CK 09/13/18 was 4516 CK 10/02/18 is 114 TSH 5.055 on 09/15/18 Cortisol on 09/20/18 was 18.4 ( 4 pm) Quantiferon Gold on 09/21/18 indeterminate QG from 10/03/18 Negative Hepatitis C from 09/21/18 NR Hepatitis A and B neg  HIV NR 09/20/18   ACTH level ( 3pm) is 445 ( normal uto 63) Microbiology: [facility] 09/12/18-NG 09/19/18 BC NG 6/3 fungal culturebloodneg  Synovial fluidculture left5/29/20 Neg Synovial fluid rt knee NG from 09/16/18 Synovial tissue cultureleft knee5/31NG NGS sent to [place] ( 16s RNA) neg AFB/FUNGAL from left synovial tissue negative from 09/17/18 so far  6/16 Blood Culture neg so far   Studies/Results: Ct Abdomen Pelvis Wo Contrast  Result Date: 10/04/2018 CLINICAL DATA:  Fever of unknown origin. EXAM: CT CHEST, ABDOMEN AND PELVIS WITHOUT CONTRAST TECHNIQUE: Multidetector CT imaging of the chest, abdomen and pelvis was performed following the standard protocol without IV contrast. COMPARISON:  Abdomen pelvis CT 04/30/2015 FINDINGS: CT CHEST FINDINGS Cardiovascular: The heart size is normal. No substantial pericardial effusion. Coronary artery calcification is evident. No thoracic aortic aneurysm. Mediastinum/Nodes: No mediastinal lymphadenopathy. No evidence for gross hilar lymphadenopathy although assessment is limited by  the lack of intravenous contrast on today's study. The esophagus has normal imaging features. There is no axillary lymphadenopathy. Lungs/Pleura: Patchy areas of ground-glass attenuation are identified in the lungs bilaterally. Subsegmental atelectasis noted in the dependent lower lobes bilaterally. No dense focal airspace consolidation. No pleural effusion. Musculoskeletal: No worrisome lytic or sclerotic osseous abnormality. CT ABDOMEN PELVIS FINDINGS Hepatobiliary: The liver shows diffusely decreased  attenuation suggesting steatosis. There is no evidence for gallstones, gallbladder wall thickening, or pericholecystic fluid. No intrahepatic or extrahepatic biliary dilation. Pancreas: No focal mass lesion. No dilatation of the main duct. No intraparenchymal cyst. No peripancreatic edema. Spleen: No splenomegaly. No focal mass lesion. Adrenals/Urinary Tract: 5.2 x 2.6 cm left adrenal mass is new since a chest CT of 07/28/2017. This mass has attenuation too high to allow classification as an adenoma. 3.2 cm low-density lesion in the interpolar right kidney approaches water density and is unchanged in the interval, likely a cyst. Similar exophytic water density lesion in the lower pole right kidney measures 5 cm and is likely a cyst. No evidence for hydroureter. Urinary bladder wall is mildly thickened and ill-defined. Tiny gas bubble in the bladder lumen noted. 4.1 x 2.3 cm nodule in the right adrenal gland is also new in the interval. This has low attenuation suggesting that it represents an adenoma. Stomach/Bowel: Stomach is unremarkable. No gastric wall thickening. No evidence of outlet obstruction. Duodenum is normally positioned as is the ligament of Treitz. No small bowel wall thickening. No small bowel dilatation. The terminal ileum is normal. No gross colonic mass. No colonic wall thickening. Diverticuli are seen scattered along the entire length of the colon without CT findings of diverticulitis. Vascular/Lymphatic: There is abdominal aortic atherosclerosis without aneurysm. There is no gastrohepatic or hepatoduodenal ligament lymphadenopathy. No intraperitoneal or retroperitoneal lymphadenopathy. Upper normal to borderline enlarged lymph nodes are seen in the external iliac pelvic sidewall bilaterally. Upper normal groin nodes are evident bilaterally. Reproductive: The prostate gland and seminal vesicles are unremarkable. Other: No intraperitoneal free fluid. Musculoskeletal: No worrisome lytic or  sclerotic osseous abnormality. Small bilateral groin hernias contain only fat. IMPRESSION: 1. Mild circumferential bladder wall thickening with ill-defined bladder wall thickening. Infection/inflammation would be a consideration. Tiny gas bubble in the bladder lumen is presumably related to recent instrumentation although bladder infection could have this appearance. 2. New 4.1 x 2.3 cm right adrenal nodule with low attenuation most suggestive of benign adrenal adenoma. 5.2 x 2.6 cm left adrenal also mass new since 07/28/2017. This has attenuation too high to allow classification as an adenoma on this noncontrast CT. Follow-up CT could be used to ensure stability or MRI of the abdomen without and with contrast could be used to further evaluate for lipid poor adenoma. 3. Patchy areas of ground-glass attenuation in both lungs. Imaging features are nonspecific and may be related to an infectious or inflammatory alveolitis. 4. Upper normal to borderline enlarged lymph nodes in the external iliac chains bilaterally, nonspecific but potentially reactive. 5. Hepatic steatosis. 6. Bilateral renal cysts. Electronically Signed   By: Misty Stanley M.D.   On: 10/04/2018 17:51   Ct Head Wo Contrast  Result Date: 10/04/2018 CLINICAL DATA:  Dizziness and weakness.  Fall. EXAM: CT HEAD WITHOUT CONTRAST TECHNIQUE: Contiguous axial images were obtained from the base of the skull through the vertex without intravenous contrast. COMPARISON:  Head CT 09/14/2018 FINDINGS: Brain: There is no mass, hemorrhage or extra-axial collection. The size and configuration of the ventricles and extra-axial CSF spaces are  normal. The brain parenchyma is normal, without acute or chronic infarction. Vascular: No abnormal hyperdensity of the major intracranial arteries or dural venous sinuses. No intracranial atherosclerosis. Skull: The visualized skull base, calvarium and extracranial soft tissues are normal. Sinuses/Orbits: Small amount of fluid at  the left mastoid tip. No middle ear effusion. Paranasal sinuses are clear. The orbits are normal. IMPRESSION: Normal brain. Electronically Signed   By: Ulyses Jarred M.D.   On: 10/04/2018 15:04   Ct Chest Wo Contrast  Result Date: 10/04/2018 CLINICAL DATA:  Fever of unknown origin. EXAM: CT CHEST, ABDOMEN AND PELVIS WITHOUT CONTRAST TECHNIQUE: Multidetector CT imaging of the chest, abdomen and pelvis was performed following the standard protocol without IV contrast. COMPARISON:  Abdomen pelvis CT 04/30/2015 FINDINGS: CT CHEST FINDINGS Cardiovascular: The heart size is normal. No substantial pericardial effusion. Coronary artery calcification is evident. No thoracic aortic aneurysm. Mediastinum/Nodes: No mediastinal lymphadenopathy. No evidence for gross hilar lymphadenopathy although assessment is limited by the lack of intravenous contrast on today's study. The esophagus has normal imaging features. There is no axillary lymphadenopathy. Lungs/Pleura: Patchy areas of ground-glass attenuation are identified in the lungs bilaterally. Subsegmental atelectasis noted in the dependent lower lobes bilaterally. No dense focal airspace consolidation. No pleural effusion. Musculoskeletal: No worrisome lytic or sclerotic osseous abnormality. CT ABDOMEN PELVIS FINDINGS Hepatobiliary: The liver shows diffusely decreased attenuation suggesting steatosis. There is no evidence for gallstones, gallbladder wall thickening, or pericholecystic fluid. No intrahepatic or extrahepatic biliary dilation. Pancreas: No focal mass lesion. No dilatation of the main duct. No intraparenchymal cyst. No peripancreatic edema. Spleen: No splenomegaly. No focal mass lesion. Adrenals/Urinary Tract: 5.2 x 2.6 cm left adrenal mass is new since a chest CT of 07/28/2017. This mass has attenuation too high to allow classification as an adenoma. 3.2 cm low-density lesion in the interpolar right kidney approaches water density and is unchanged in the  interval, likely a cyst. Similar exophytic water density lesion in the lower pole right kidney measures 5 cm and is likely a cyst. No evidence for hydroureter. Urinary bladder wall is mildly thickened and ill-defined. Tiny gas bubble in the bladder lumen noted. 4.1 x 2.3 cm nodule in the right adrenal gland is also new in the interval. This has low attenuation suggesting that it represents an adenoma. Stomach/Bowel: Stomach is unremarkable. No gastric wall thickening. No evidence of outlet obstruction. Duodenum is normally positioned as is the ligament of Treitz. No small bowel wall thickening. No small bowel dilatation. The terminal ileum is normal. No gross colonic mass. No colonic wall thickening. Diverticuli are seen scattered along the entire length of the colon without CT findings of diverticulitis. Vascular/Lymphatic: There is abdominal aortic atherosclerosis without aneurysm. There is no gastrohepatic or hepatoduodenal ligament lymphadenopathy. No intraperitoneal or retroperitoneal lymphadenopathy. Upper normal to borderline enlarged lymph nodes are seen in the external iliac pelvic sidewall bilaterally. Upper normal groin nodes are evident bilaterally. Reproductive: The prostate gland and seminal vesicles are unremarkable. Other: No intraperitoneal free fluid. Musculoskeletal: No worrisome lytic or sclerotic osseous abnormality. Small bilateral groin hernias contain only fat. IMPRESSION: 1. Mild circumferential bladder wall thickening with ill-defined bladder wall thickening. Infection/inflammation would be a consideration. Tiny gas bubble in the bladder lumen is presumably related to recent instrumentation although bladder infection could have this appearance. 2. New 4.1 x 2.3 cm right adrenal nodule with low attenuation most suggestive of benign adrenal adenoma. 5.2 x 2.6 cm left adrenal also mass new since 07/28/2017. This has attenuation too high to  allow classification as an adenoma on this noncontrast  CT. Follow-up CT could be used to ensure stability or MRI of the abdomen without and with contrast could be used to further evaluate for lipid poor adenoma. 3. Patchy areas of ground-glass attenuation in both lungs. Imaging features are nonspecific and may be related to an infectious or inflammatory alveolitis. 4. Upper normal to borderline enlarged lymph nodes in the external iliac chains bilaterally, nonspecific but potentially reactive. 5. Hepatic steatosis. 6. Bilateral renal cysts. Electronically Signed   By: Misty Stanley M.D.   On: 10/04/2018 17:51   Mr Brain Wo Contrast  Result Date: 10/05/2018 CLINICAL DATA:  Altered level of consciousness. EXAM: MRI HEAD WITHOUT CONTRAST TECHNIQUE: Multiplanar, multiecho pulse sequences of the brain and surrounding structures were obtained without intravenous contrast. COMPARISON:  CT head 10/04/2018 FINDINGS: Brain: Ventricle size and cerebral volume normal. Negative for acute infarct, hemorrhage, mass. Scattered small white matter hyperintensities bilaterally likely due to microvascular ischemia. Vascular: Normal arterial flow voids Skull and upper cervical spine: Negative Sinuses/Orbits: Mild mucosal edema paranasal sinuses. Left mastoid effusion. Normal orbit. Other: None IMPRESSION: No acute abnormality. Mild chronic white matter changes likely due to microvascular ischemia. Electronically Signed   By: Franchot Gallo M.D.   On: 10/05/2018 14:36           Assessment/Plan: 58 yr male had B/l knee replacements Sep 01, 2018 was admitted with fever, AKI . He was taking bactrim and keflex when he came to the Ed on 5/26/20with fever  2nd cycle of fever, altered mental status, hypotension ( more orthostasis) , weakness,  due to adrenal crisis/insufficiency  Pt has primary adrenal insufficiency ( high ACTH and low cortisol and no response to cosyntropin test and MRI showing b/l adrenal mass )   the cause of which could a) b/l adrenal hematoma /hemorrhage  causing adrenal crisis   B)Adrenal metastasis/malignancy   C) unlikely this is infiltrative adrenal disease  Due to  TB/HIV/histoplasma as all tests are negative  Adenoma even if b/l should not cause adrenal insufficiency  Adrenal hyperplasia may cause excess secretion of cortisol and not deficiency  If this is b/l adrenal Hage induced adrenal crisis, was it because of infection  ( but  we did not culture bacteria from any source and even the knee joint cell count was 8000) or did he have adrenal hematoma/hemorrhage from [place] or HIT  He is currently on hydrocortisone 10mg  BID   He needs an endocrine consult  He will need MRI with contrast once cr normalizes ------------------------------------------------------------------ Initially feverswere persisting inspite of IV vanco and cefepime and there was no other source (Blood culture neg, urine culture neg, no pneumonia, ) the left knee was aspirated and it had 8000 wbc and he was taken for a wash out on 09/17/18. Rt knee was washed out on 09/18/18 All cultures neg Pts fever was down for 36-48 hrs and started up again on 09/19/18 at 103. Central line, femoral line were removed Cefepime was changed to zosyn on 6/2 Vanco was discontinued on 09/19/18 -added anidulafungin6/3and doxy 6/3- - he also was started on stress dose steroids on the same day and was also diagnosed with left popliteal DVT on 09/20/18 and started on heparin- Asfungal blood culture negDC anidulafungin0n 09/26/18 Doxy Dc as well. 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 Now on coumadin  Anemia  Discussed the management in detail with Dr.Sudini Pt's wife  ID will follow peripherally over the weekend- call if needed

## 2018-10-06 NOTE — Progress Notes (Signed)
Physical Therapy Treatment Patient Details Name: Isaac Cross MRN: [id number] DOB: Oct 06, 1960 Today's Date: 10/06/2018    History of Present Illness Pt is a 58 y.o. male presenting to hospital 09/12/18 after rolling OOB landing on L knee; pt with increased dizziness x2 days, fevers x3 days, and generalized weakness.  S/p B TKR 09/01/18 in [place] (pt reports discharging home same day).  Pt admitted with sepsis, acute renal failure, and B TKR.  Pt s/p L knee 5/31 and R knee 6/1 I&D with poly exchange.  Pt noted with severe encephalopathy from DT's with extensive ETOH abuse.  L LE DVT noted.  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  HD started 6/5 with L IJ temporary HD catheter.  PMH includes B TKR 09/01/18 in [place], DM, asthma, R vein surgery, COPD. Right IJ line removed 6/16. 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    PT Comments    Pt in bed upon arrival agreeable to participate: wife in room permitted for 1x visit for education/updates on best strategies for assisting patient with heavy mobility needs. Reviewed bed level exercises in room, pt with improved pain and activation from previous days, but still very painful end-range flexion with is arresting as it nears 90 degrees bilat. Pt still needs minA physical assist to perform all exercises, and sometimes more assist. His ability to attend to reps remain limited, and his wife reminds him to perform reps as cued when he stops prematurely. Orthostatic vitals initially better this data, with a dip in sitting that bounces back after 90 seconds, but after standing activity commences, BP drops fairly consistently and quickly to 80s/60s and does not resolve without repositioning to near-supine. This subsequent hypotension has a significant affect on his mentation and strength with patient essentially losing any clarity gain since previous days. After transfer to chair, pt also assisted back to [facility] and then back to chair  again, but BP recovery during these transitions was very poor, without ability to elevate feet while on BSC and unable to recline BSC. Pt does well with independent pericare after bowel movement. Pt left in recliner at end of session, feet elevated, heels floated to promoted terminal knee extension as recommended by orthopedist. Chair is reclined to promote full recovery of BP and improve cognition. From lower surfaces such as chair and BSC (both of which are slightly elevated from standard height), pt requires MaxA+2 to rise to standing. Once up, pt is fairly stable, however when BP drops, pt has poor capacity to report his presyncopal prodrome.    Follow Up Recommendations  CIR     Equipment Recommendations  3in1 (PT);Other (comment)    Recommendations for Other Services OT consult     Precautions / Restrictions Precautions Precautions: Fall Precaution Comments: Pt has been cognitively impaired with poor awareness of this, not very vocal about dizziness, pain, need ot rest, etc. Orthostatic for 4+ days. Restrictions RLE Weight Bearing: Weight bearing as tolerated LLE Weight Bearing: Weight bearing as tolerated    Mobility  Bed Mobility Overal bed mobility: Needs Assistance Bed Mobility: Supine to Sit     Supine to sit: Mod assist     General bed mobility comments: assist for legs, then hand held assiost for patient to pull self up  Transfers Overall transfer level: Needs assistance Equipment used: Rolling walker (2 wheeled);None Transfers: Sit to/from Stand Sit to Stand: Max assist;+2 physical assistance;+2 safety/equipment;Min guard         General transfer  comment: multiple transfers performed in session; minGuard from 28" surface, then modA+2 from chair (normstatic) and max-totalA +2 from chair/BSC after BP drop.  Ambulation/Gait Ambulation/Gait assistance: Min guard;+2 safety/equipment Gait Distance (Feet): 2 Feet Assistive device: Rolling walker (2 wheeled) Gait  Pattern/deviations: (3-point RW gait with heavy BUE support on RW)     General Gait Details: Inititally feeling good and looking alert but suddenly stops and leans elbows on RW, reports dizzy, encouraged to sit; BP noted to be 80s/60s.   Stairs             Wheelchair Mobility    Modified Rankin (Stroke Patients Only)       Balance Overall balance assessment: Modified Independent;Mild deficits observed, not formally tested;History of Falls   Sitting balance-Leahy Scale: Good(worse when hypotensive.)   Postural control: (worse when hypotensive.)                                  Cognition Arousal/Alertness: Awake/alert Behavior During Therapy: Flat affect;WFL for tasks assessed/performed(WFL early in session, but cognitively impaired after BP drop) Overall Cognitive Status: Impaired/Different from baseline Area of Impairment: Memory;Following commands;Awareness;Attention;Problem solving;Orientation                     Memory: Decreased short-term memory Following Commands: Follows one step commands inconsistently;Follows one step commands with increased time Safety/Judgement: Decreased awareness of safety;Decreased awareness of deficits   Problem Solving: Slow processing;Decreased initiation;Requires verbal cues;Difficulty sequencing General Comments: pt remains generally flat affect, drowsy, fatigued, and exerted. Has difficulty with simple commands for bed mobility. Asks for clinician to wait so he can do it when offered assistance at start of visit, but accepted assistence towards end.      Exercises Total Joint Exercises Ankle Circles/Pumps: AROM;Strengthening;Both;Supine;15 reps;Limitations Ankle Circles/Pumps Limitations: needs cues to attend to counting. Asked to perfomr 15, stops at 10 inititally. Short Arc Quad: Strengthening;Both;Supine;15 reps;AAROM;Limitations Short Arc Quad Limitations: needs cues to attend to counting. Asked to perfomr  15, stops at 10 inititally. improve activion and less pain from yesterday. Heel Slides: AAROM;Strengthening;Both;Supine;Limitations;5 reps Heel Slides Limitations: still very painful adn poorly tolerated; performed 5x to improve comfort for performance of bridging. Hip ABduction/ADduction: AAROM;Strengthening;Both;Supine;15 reps;Limitations Hip Abduction/Adduction Limitations: needs cues to attend to counting. Asked to perfomr 15, stops at 10 inititally. improve activion and less pain from yesterday. Goniometric ROM: struggling with pain to get to 90 degrees bilat, Rt>Lt. Bridges: 15 reps;Supine(legs assisted into position per author, bed shortened to prevent loss of position.)    General Comments        Pertinent Vitals/Pain Pain Assessment: Faces Faces Pain Scale: Hurts little more(worse with positioning and knee flexion) Pain Location: Bilat knees, R>L Pain Descriptors / Indicators: Sore;Discomfort;Grimacing Pain Intervention(s): Limited activity within patient's tolerance;Monitored during session;Repositioned    Home Living                      Prior Function            PT Goals (current goals can now be found in the care plan section) Acute Rehab PT Goals Patient Stated Goal: to go home PT Goal Formulation: With patient Time For Goal Achievement: 10/13/18 Potential to Achieve Goals: Poor Progress towards PT goals: Progressing toward goals    Frequency    7X/week      PT Plan Current plan remains appropriate    Co-evaluation  AM-PAC PT "6 Clicks" Mobility   Outcome Measure  Help needed turning from your back to your side while in a flat bed without using bedrails?: A Little Help needed moving from lying on your back to sitting on the side of a flat bed without using bedrails?: A Little Help needed moving to and from a bed to a chair (including a wheelchair)?: A Little Help needed standing up from a chair using your arms (e.g.,  wheelchair or bedside chair)?: A Little Help needed to walk in hospital room?: A Lot Help needed climbing 3-5 steps with a railing? : Total 6 Click Score: 15    End of Session Equipment Utilized During Treatment: Gait belt Activity Tolerance: Patient limited by pain;Treatment limited secondary to medical complications (Comment);Other (comment);Patient limited by lethargy(Orthostatic affects cognition and strength drastically for prolonged periods.) Patient left: with call bell/phone within reach;with nursing/sitter in room;with family/visitor present;in chair;with chair alarm set Nurse Communication: Mobility status;Need for lift equipment PT Visit Diagnosis: Other abnormalities of gait and mobility (R26.89);Muscle weakness (generalized) (M62.81);Difficulty in walking, not elsewhere classified (R26.2);Pain Pain - Right/Left: (bilat knees)     Time: 0802-2336 PT Time Calculation (min) (ACUTE ONLY): 94 min  Charges:  $Gait Training: 23-37 mins $Therapeutic Exercise: 53-67 mins                     2:40 PM, 10/06/18 Etta Grandchild, PT, DPT Physical Therapist - [facility] [place]  [phone] ([place])    [place] 10/06/2018, 2:30 PM

## 2018-10-06 NOTE — Consult Note (Signed)
[facility] Consult Follow-up  Reason for Consult: Cognitive impairment, moca score 7 out of 30 Per physical therapy Referring Physician: Dr. 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 Total Time spent with patient: 15 minutes  Subjective: "Some pain in the knees but nothing unusual."  HPI:   BLONG BUSK is a 58 y.o. male patient   admitted on 09/12/2018 with a known history of arthritis, COPD not on home oxygen, hypertension, sleep apnea, non-insulin-dependent diabetes mellitus presents to hospital secondary to dizziness, weakness and a fall this morning. Patient had bilateral knee replacement surgeries done about 9 days ago.  He did well for the first week.  Over the last 3 to 4 days he has been having fevers and chills.  Initially had increased frequency of urination that has decreased the last 3 days.  His urine output has been decreased.  He has been feeling dizzy and lightheaded.  Has been nauseous and had one episode of vomiting.  Denies any abdominal pain or diarrhea.  Denies any exposure to sick contacts.  His COVID-19 test is negative here.  Denies any cough or respiratory symptoms or chest pain.  He states that he fell out of bed onto his knees this morning and hurt both his knees.  After the surgery he has been getting home health physical therapy and was able to walk using his walker. In the ED his creatinine is increased from 1 at baseline to 3.7 today.  He received 4.5 L of fluids in the emergency room and his urine output after a Foley catheter was placed here was only 75 cc.  He is hyponatremic, acidotic.  Urine analysis showing  possible infection.  Has lactic acidosis with elevated WBC and procalcitonin.  He is being admitted for sepsis.  Psychiatry consultation is requested for fluctuations in cognition. Record review does indicate that patient has had delirium while hospitalized.  Past Psychiatric History: Anxiety and depression; likely adjustment disorder due to bilateral knee replacement surgeries followed by acute sepsis; delirium per medical records.  10/05/18:  On evaluation this morning, patient is calm and cooperative.  He is alert and oriented x4.  He is situationally aware of hospitalization, personal events/illness and world events.  Patient expresses some frustration regarding his prolonged hospitalization.  He specifically however denies any suicidal ideation, plan or intent stating, "things will never get that bad.  My family depends on me whether I have knees or not."  Patient denies any HI.  He denies any AVH.  Patient expresses that his treatment team is starting to coordinate his discharge planning.  He remains hopeful that he will continue to improve.  Support and encouragement is provided.  10/06/18:  On evaluation this afternoon, patient is pleasant and cooperative.  Denies any depression at this time, low level of anxiety related to wanting to go home and improving.  Complains of feeling tired and fatigued, minimal pain in his knees.  Minimal to no cognitive impairment on assessment.  He was able to talk about his children, grandchildren, and wife.  Identified the ages of children and grandchildren.  No memory issues noted and was able to move himself up in  the bed with polar care in place on his right knee.  Patient appears to be clearing his cognitive impairment.  Risk to Self:  None Risk to Others:  None Prior Inpatient Therapy:  None Prior Outpatient Therapy:  None  Past Medical History:  Past Medical History:  Diagnosis Date  . Allergy   . Anxiety   . Arthritis    knees  . Asthma, mild  intermittent, well-controlled   . Chronic obstructive asthma ([place])   . COPD (chronic obstructive pulmonary disease) ([place])   . Depression with anxiety   . Diabetes ([place])   . GERD (gastroesophageal reflux disease)   . HBP (high blood pressure)   . High cholesterol   . Renal cyst, right   . Sleep apnea    CPAP  . Swelling     Past Surgical History:  Procedure Laterality Date  . BLADDER REPAIR    . COLONOSCOPY WITH PROPOFOL N/A 04/07/2015   Procedure: COLONOSCOPY WITH PROPOFOL;  Surgeon: Lucilla Lame, MD;  Location: [place];  Service: Endoscopy;  Laterality: N/A;  Diabetic - oral meds CPAP  . HAND SURGERY     4TH AND 5TH FINGER  . I&D KNEE WITH POLY EXCHANGE Right 09/18/2018   Procedure: IRRIGATION AND DEBRIDEMENT KNEE WITH POLY EXCHANGE;  Surgeon: Lovell Sheehan, MD;  Location: [facility];  Service: Orthopedics;  Laterality: Right;  . KNEE ARTHROSCOPY Right   . 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 TOTAL KNEE ARTHROPLASTY Left 09/17/2018   Procedure: I &D left knee and polyethylene exchange;  Surgeon: Lovell Sheehan, MD;  Location: [facility];  Service: Orthopedics;  Laterality: Left;  Marland Kitchen VEIN SURGERY Right    Family History:  Family History  Problem Relation Age of Onset  . Hypertension Mother   . Diabetes Mother   . Diabetes Father   . Hypertension Father   . Cancer Father   . Seizures Daughter   . Lupus Daughter   . Cancer Maternal Grandfather        Prostate  . Diabetes Paternal Grandfather   . Prostate cancer Maternal Uncle   . Bladder Cancer Neg Hx   . Kidney cancer Neg Hx    Family Psychiatric  History: Denies  Social History:  Social History   Substance and Sexual Activity  Alcohol Use Yes  . Alcohol/week: 10.0 standard drinks  . Types: 10 Cans of beer per week   Comment: occasional     Social History   Substance and Sexual Activity  Drug Use No    Social History   Socioeconomic History  . Marital status: Married    Spouse  name: Not on file  . Number of children: 2  . Years of education: Not on file  . Highest education level: Bachelor's degree (e.g., BA, AB, BS)  Occupational History  . Occupation: case Freight forwarder   Social Needs  . Financial resource strain: Not hard at all  . Food insecurity    Worry: Never true    Inability: Never true  . Transportation needs    Medical: No    Non-medical: No  Tobacco Use  . Smoking status: Former Smoker    Packs/day: 1.00    Years: 10.00    Pack years: 10.00    Types: Cigarettes    Start date: 04/20/1995    Quit date: 04/19/2005    Years since quitting: 13.4  . Smokeless tobacco: Never Used  Substance and Sexual Activity  . Alcohol use: Yes  Alcohol/week: 10.0 standard drinks    Types: 10 Cans of beer per week    Comment: occasional  . Drug use: No  . Sexual activity: Yes    Partners: Female  Lifestyle  . Physical activity    Days per week: 0 days    Minutes per session: 0 min  . Stress: Not at all  Relationships  . Social connections    Talks on phone: More than three times a week    Gets together: Once a week    Attends religious service: Never    Active member of club or organization: No    Attends meetings of clubs or organizations: Never    Relationship status: Married  Other Topics Concern  . Not on file  Social History Narrative   Lives at home with wife, using a walker since his surgery recently.   Additional Social History:  Patient is married to his current wife, [name] of 16 years.  He has 2 children ages 80 and 42 from his previous wife. 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    Allergies:   Allergies  Allergen Reactions  . 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 Lipitor [Atorvastatin] Hives and Itching  . Other   . Viagra  [Sildenafil Citrate]     vision loss    Labs:  Results for orders placed or performed during  the hospital encounter of 09/12/18 (from the past 48 hour(s))  Glucose, capillary     Status: None   Collection Time: 10/04/18  5:42 PM  Result Value Ref Range   Glucose-Capillary 82 70 - 99 mg/dL   Comment 1 Notify RN    Comment 2 Document in Chart   Glucose, capillary     Status: None   Collection Time: 10/04/18  9:00 PM  Result Value Ref Range   Glucose-Capillary 79 70 - 99 mg/dL  Protime-INR     Status: Abnormal   Collection Time: 10/05/18  5:06 AM  Result Value Ref Range   Prothrombin Time 24.9 (H) 11.4 - 15.2 seconds   INR 2.3 (H) 0.8 - 1.2    Comment: (NOTE) INR goal varies based on device and disease states. Performed at [facility], [address] [place], [place] [address]   CK     Status: None   Collection Time: 10/05/18  5:06 AM  Result Value Ref Range   Total CK 92 49 - 397 U/L    Comment: Performed at [facility], [address] [place], [place] [address]  Basic metabolic panel     Status: Abnormal   Collection Time: 10/05/18  5:06 AM  Result Value Ref Range   Sodium 137 135 - 145 mmol/L   Potassium 3.1 (L) 3.5 - 5.1 mmol/L   Chloride 103 98 - 111 mmol/L   CO2 21 (L) 22 - 32 mmol/L   Glucose, Bld 89 70 - 99 mg/dL   BUN 35 (H) 6 - 20 mg/dL   Creatinine, Ser 6.63 (H) 0.61 - 1.24 mg/dL   Calcium 8.3 (L) 8.9 - 10.3 mg/dL   GFR calc non Af Amer 8 (L) >60 mL/min   GFR calc Af Amer 10 (L) >60 mL/min   Anion gap 13 5 - 15    Comment: Performed at [facility], [address], [place], [place] [address]  Rheumatoid factor  Status: None   Collection Time: 10/05/18  5:06 AM  Result Value Ref Range   Rhuematoid fact SerPl-aCnc <10.0 0.0 - 13.9 IU/mL    Comment: (NOTE) Performed At: [facility] [place], [place] [id number] Rush Farmer MD [id number]   Protein electrophoresis, serum     Status: Abnormal   Collection Time: 10/05/18  5:06 AM  Result Value Ref Range   Total Protein ELP 6.2 6.0 - 8.5  g/dL   Albumin ELP 2.1 (L) 2.9 - 4.4 g/dL   Alpha-1-Globulin 0.5 (H) 0.0 - 0.4 g/dL   Alpha-2-Globulin 1.1 (H) 0.4 - 1.0 g/dL   Beta Globulin 0.7 0.7 - 1.3 g/dL   Gamma Globulin 1.8 0.4 - 1.8 g/dL   M-Spike, % Not Observed Not Observed g/dL   SPE Interp. Comment     Comment: (NOTE) The SPE pattern demonstrates elevation of regions containing acute phase proteins suggesting an acute/subacute inflammatory response. Some conditions in which this pattern has been observed include: bacterial, viral or parasitic infection; mechanical, physical or chemical trauma; and cardiac failure. The gamma globulin region is unremarkable and evidence of monoclonal protein is not apparent. Performed At: [facility] [place], [place] [id number] Rush Farmer MD [id number]    Comment Comment     Comment: (NOTE) Protein electrophoresis scan will follow via computer, mail, or courier delivery.    Globulin, Total 4.1 (H) 2.2 - 3.9 g/dL   A/G Ratio 0.5 (L) 0.7 - 1.7  RPR     Status: None   Collection Time: 10/05/18  5:06 AM  Result Value Ref Range   RPR Ser Ql Non Reactive Non Reactive    Comment: (NOTE) Performed At: [facility] [place], [place] [id number] Rush Farmer MD [id number]   CBC with Differential/Platelet     Status: Abnormal   Collection Time: 10/05/18  5:06 AM  Result Value Ref Range   WBC 8.3 4.0 - 10.5 K/uL   RBC 2.96 (L) 4.22 - 5.81 MIL/uL   Hemoglobin 7.8 (L) 13.0 - 17.0 g/dL   HCT 24.3 (L) 39.0 - 52.0 %   MCV 82.1 80.0 - 100.0 fL   MCH 26.4 26.0 - 34.0 pg   MCHC 32.1 30.0 - 36.0 g/dL   RDW 15.9 (H) 11.5 - 15.5 %   Platelets 354 150 - 400 K/uL   nRBC 0.0 0.0 - 0.2 %   Neutrophils Relative % 51 %   Neutro Abs 4.3 1.7 - 7.7 K/uL   Lymphocytes Relative 26 %   Lymphs Abs 2.1 0.7 - 4.0 K/uL   Monocytes Relative 15 %   Monocytes Absolute 1.2 (H) 0.1 - 1.0 K/uL   Eosinophils Relative 5 %   Eosinophils Absolute 0.4 0.0 -  0.5 K/uL   Basophils Relative 2 %   Basophils Absolute 0.1 0.0 - 0.1 K/uL   WBC Morphology MORPHOLOGY UNREMARKABLE    RBC Morphology MORPHOLOGY UNREMARKABLE    Smear Review Normal platelet morphology    Immature Granulocytes 1 %   Abs Immature Granulocytes 0.04 0.00 - 0.07 K/uL    Comment: Performed at [facility], [address] [place],  [address]  ACTH stimulation, 3 time points     Status: None   Collection Time: 10/05/18  7:10 AM  Result Value Ref Range   Cortisol, Base 5.2 ug/dL    Comment: NO NORMAL RANGE ESTABLISHED FOR THIS TEST   Cortisol, 30 Min 5.3 ug/dL   Cortisol, 60 Min  5.6 ug/dL    Comment: Performed at [facility], [place] [address] [place], [place] [address]  Glucose, capillary     Status: None   Collection Time: 10/05/18  8:00 AM  Result Value Ref Range   Glucose-Capillary 80 70 - 99 mg/dL   Comment 1 Notify RN   Urinalysis, Routine w reflex microscopic     Status: Abnormal   Collection Time: 10/05/18 10:25 AM  Result Value Ref Range   Color, Urine STRAW (A) YELLOW   APPearance CLEAR (A) CLEAR   Specific Gravity, Urine 1.006 1.005 - 1.030   pH 7.0 5.0 - 8.0   Glucose, UA NEGATIVE NEGATIVE mg/dL   Hgb urine dipstick SMALL (A) NEGATIVE   Bilirubin Urine NEGATIVE NEGATIVE   Ketones, ur NEGATIVE NEGATIVE mg/dL   Protein, ur 30 (A) NEGATIVE mg/dL   Nitrite NEGATIVE NEGATIVE   Leukocytes,Ua NEGATIVE NEGATIVE   RBC / HPF 0-5 0 - 5 RBC/hpf   WBC, UA 0-5 0 - 5 WBC/hpf   Bacteria, UA NONE SEEN NONE SEEN   Squamous Epithelial / LPF NONE SEEN 0 - 5   Mucus PRESENT     Comment: Performed at [facility], [address] [place], [place] [address]  Glucose, capillary     Status: None   Collection Time: 10/05/18 12:29 PM  Result Value Ref Range   Glucose-Capillary 98 70 - 99 mg/dL   Comment 1 Notify RN   Glucose, capillary     Status: Abnormal   Collection Time: 10/05/18  4:37 PM  Result Value Ref Range   Glucose-Capillary 108  (H) 70 - 99 mg/dL   Comment 1 Notify RN   Glucose, capillary     Status: Abnormal   Collection Time: 10/05/18  9:05 PM  Result Value Ref Range   Glucose-Capillary 115 (H) 70 - 99 mg/dL  Protime-INR     Status: Abnormal   Collection Time: 10/06/18  6:11 AM  Result Value Ref Range   Prothrombin Time 23.4 (H) 11.4 - 15.2 seconds   INR 2.1 (H) 0.8 - 1.2    Comment: (NOTE) INR goal varies based on device and disease states. Performed at [facility], [address] [place], [place] [address]   Basic metabolic panel     Status: Abnormal   Collection Time: 10/06/18  6:11 AM  Result Value Ref Range   Sodium 135 135 - 145 mmol/L   Potassium 3.6 3.5 - 5.1 mmol/L   Chloride 101 98 - 111 mmol/L   CO2 25 22 - 32 mmol/L   Glucose, Bld 137 (H) 70 - 99 mg/dL   BUN 31 (H) 6 - 20 mg/dL   Creatinine, Ser 5.17 (H) 0.61 - 1.24 mg/dL   Calcium 8.2 (L) 8.9 - 10.3 mg/dL   GFR calc non Af Amer 11 (L) >60 mL/min   GFR calc Af Amer 13 (L) >60 mL/min   Anion gap 9 5 - 15    Comment: Performed at [facility], [address] [place],  [address]  Glucose, capillary     Status: Abnormal   Collection Time: 10/06/18  8:16 AM  Result Value Ref Range   Glucose-Capillary 105 (H) 70 - 99 mg/dL   Comment 1 Notify RN   Glucose, capillary     Status: Abnormal   Collection Time: 10/06/18 12:27 PM  Result Value Ref Range   Glucose-Capillary 117 (H) 70 - 99 mg/dL   Comment 1 Notify RN  Glucose, capillary     Status: Abnormal   Collection Time: 10/06/18  5:17 PM  Result Value Ref Range   Glucose-Capillary 107 (H) 70 - 99 mg/dL   Comment 1 Notify RN     Current Facility-Administered Medications  Medication Dose Route Frequency Provider Last Rate Last Dose  . 0.9 %  sodium chloride infusion   Intravenous PRN Flora Lipps, MD 5 mL/hr at 09/29/18 0242    . 0.9 %  sodium chloride infusion  250 mL Intravenous PRN Flora Lipps, MD 0 mL/hr at 09/30/18 1534    . acetaminophen (TYLENOL)  tablet 650 mg  650 mg Oral Q6H PRN Lovell Sheehan, MD   650 mg at 10/05/18 1613   Or  . acetaminophen (TYLENOL) suppository 650 mg  650 mg Rectal Q6H PRN Lovell Sheehan, MD   650 mg at 09/20/18 0444  . alum & mag hydroxide-simeth (MAALOX/MYLANTA) 200-200-20 MG/5ML suspension 30 mL  30 mL Oral Q4H PRN Fritzi Mandes, MD      . anticoagulant sodium citrate solution 5 mL  5 mL Intravenous Q dialysis Lateef, Munsoor, MD   5 mL at 09/29/18 1837  . aspirin EC tablet 81 mg  81 mg Oral Daily Lovell Sheehan, MD   81 mg at 10/06/18 1040  . Chlorhexidine Gluconate Cloth 2 % PADS 6 each  6 each Topical Q0600 Lovell Sheehan, MD   6 each at 10/02/18 [phone]  . DAPTOmycin (CUBICIN) 700 mg in sodium chloride 0.9 % IVPB  700 mg Intravenous Q48H Berton Mount, RPH   Stopped at 10/05/18 2124  . feeding supplement (NEPRO CARB STEADY) liquid 237 mL  237 mL Oral BID BM Flora Lipps, MD 237 mL/hr at 09/25/18 0951 237 mL at 10/06/18 1313  . fludrocortisone (FLORINEF) tablet 0.1 mg  0.1 mg Oral Daily Sudini, Alveta Heimlich, MD   0.1 mg at 10/06/18 1040  . fluticasone furoate-vilanterol (BREO ELLIPTA) 100-25 MCG/INH 1 puff  1 puff Inhalation Daily Fritzi Mandes, MD   1 puff at 10/06/18 1040  . folic acid (FOLVITE) tablet 1 mg  1 mg Oral Daily Fritzi Mandes, MD   1 mg at 10/06/18 1040  . HYDROcodone-acetaminophen (NORCO/VICODIN) 5-325 MG per tablet 1-2 tablet  1-2 tablet Oral Q4H PRN Lovell Sheehan, MD   2 tablet at 09/19/18 0450  . hydrocortisone (CORTEF) tablet 20 mg  20 mg Oral BID Sudini, Alveta Heimlich, MD      . insulin aspart (novoLOG) injection 0-5 Units  0-5 Units Subcutaneous QHS Fritzi Mandes, MD      . insulin aspart (novoLOG) injection 0-9 Units  0-9 Units Subcutaneous TID WC Fritzi Mandes, MD   1 Units at 10/03/18 1248  . iohexol (OMNIPAQUE) 240 MG/ML injection 50 mL  50 mL Oral Once PRN Awilda Bill, NP      . ipratropium-albuterol (DUONEB) 0.5-2.5 (3) MG/3ML nebulizer solution 3 mL  3 mL Nebulization Q6H PRN Flora Lipps, MD    3 mL at 09/25/18 0721  . levofloxacin (LEVAQUIN) IVPB 750 mg  750 mg Intravenous Q48H Ravishankar, Jayashree, MD      . MEDLINE mouth rinse  15 mL Mouth Rinse BID Flora Lipps, MD   15 mL at 10/06/18 1040  . menthol-cetylpyridinium (CEPACOL) lozenge 3 mg  1 lozenge Oral PRN Lovell Sheehan, MD       Or  . phenol (CHLORASEPTIC) mouth spray 1 spray  1 spray Mouth/Throat PRN Lovell Sheehan, MD      .  multivitamin with minerals tablet 1 tablet  1 tablet Oral Daily Fritzi Mandes, MD   1 tablet at 10/06/18 1040  . omega-3 acid ethyl esters (LOVAZA) capsule 1 g  1 g Oral Daily Fritzi Mandes, MD   1 g at 10/06/18 1040  . ondansetron (ZOFRAN) tablet 4 mg  4 mg Oral Q6H PRN Lovell Sheehan, MD       Or  . ondansetron [facility]) injection 4 mg  4 mg Intravenous Q6H PRN Lovell Sheehan, MD      . sodium chloride flush (NS) 0.9 % injection 3 mL  3 mL Intravenous Q12H Flora Lipps, MD   3 mL at 10/04/18 2319  . sodium chloride flush (NS) 0.9 % injection 3 mL  3 mL Intravenous PRN Flora Lipps, MD   3 mL at 09/24/18 0805  . warfarin (COUMADIN) tablet 6 mg  6 mg Oral ONCE-1800 Hillary Bow, MD      . Warfarin - Pharmacist Dosing Inpatient   Does not apply [id number] Fritzi Mandes, MD        Musculoskeletal: Strength & Muscle Tone: decreased Gait & Station: unsteady Patient leans: N/A  Psychiatric Specialty Exam: Physical Exam  Nursing note and vitals reviewed. Constitutional: He is oriented to person, place, and time. He appears well-developed and well-nourished. No distress.  HENT:  Head: Normocephalic and atraumatic.  Eyes: EOM are normal.  Neck: Normal range of motion.  Cardiovascular: Normal rate and regular rhythm.  Respiratory: Effort normal. No respiratory distress.  Musculoskeletal: Normal range of motion.  Neurological: He is alert and oriented to person, place, and time.  Psychiatric: His speech is normal and behavior is normal. Judgment and thought content normal. His affect is blunt. Cognition  and memory are normal.    Review of Systems  Constitutional: Positive for malaise/fatigue.  Respiratory: Negative.   Cardiovascular: Negative.   Gastrointestinal: Negative.   Musculoskeletal: Negative.   Neurological: Negative.   Psychiatric/Behavioral: Positive for memory loss.    Blood pressure 134/85, pulse 93, temperature 98.9 F (37.2 C), temperature source Oral, resp. rate 16, height 6\' 4"  (1.93 m), weight (!) 156 kg, SpO2 100 %.Body mass index is 41.86 kg/m.  General Appearance: Casual and Neat  Eye Contact:  Good  Speech:  Clear and Coherent and Normal Rate  Volume:  Normal  Mood:  Euthymic  Affect:  Appropriate  Thought Process:  Coherent, Linear and Descriptions of Associations: Intact  Orientation:  Full (Time, Place, and Person)  Thought Content:  Logical and Hallucinations: None  Suicidal Thoughts:  No  Homicidal Thoughts:  No  Memory:  Good  Judgement:  Fair  Insight:  Fair  Psychomotor Activity:  Decreased  Concentration:  Concentration: Good and Attention Span: Good  Recall:  [place] of Knowledge:  Good  Language:  Good  Akathisia:  No  Handed:  Right  AIMS (if indicated):     Assets:  Communication Skills Desire for Improvement Financial Resources/Insurance Housing Intimacy Social Support Talents/Skills Vocational/Educational  ADL's:  Impaired  Cognition:  WNL  Sleep:   Patient has obstructive sleep apnea, uses BiPAP at night     Treatment Plan Summary: Daily contact with patient to assess and evaluate symptoms and progress in treatment and Medication management  Depression and anxiety Restart Celexa at 10 mg daily for anxiety and depression.  Patient likely has ongoing delirium which presents as fluctuating cognition.  At time of this assessment patient has full capacity.  The patient's exam is notable for  altered sensorium, perceptual disturbances, disorientation and cognitive deficits that appear markedly different than their baseline,  suggesting a diagnosis of delirium.  Virtually any medical condition or physiologic stress can precipitate delirium in a susceptible individual, with risk increasing in those with: advanced age, sensory impairments, organic brain disease (stroke, dementia, Parkinsons), psychiatric illness, major chronic medical issues, prolonged hospitalizations, postoperative status, anemia, insomnia/disturbed sleep, and severe pain. Addressing the underlying medical condition and institution of preventative measures are recommended.  - Continue to monitor and treat underlying medical causes of delirium, including infection, electrolyte disturbances, etc. - Delirium precautions - Minimize/avoid deliriogenic meds including: anticholinergic, opiates, benzodiazepines           - Maintain hydration, oxygenation, nutrition           - Limit use of restraints and catheters           - Normalize sleep patterns by minimizing nighttime noise, light and interruptions by     -Ensure sleep apnea treatment is provided overnight.             clustering care, opening blinds during the day           - Reorient the patient frequently, provide easily visible clock and calendar           - Provide sensory aids like glasses, hearing aids           - Encourage ambulation, regular activities and visitors to maintain cognitive stimulation   -Patient would benefit from having family members at bedside to reinforce his orientation.  Disposition: No evidence of imminent risk to self or others at present.   Patient does not meet criteria for psychiatric inpatient admission. Supportive therapy provided about ongoing stressors.  Waylan Boga, NP 10/06/2018 5:25 PM

## 2018-10-06 NOTE — Progress Notes (Signed)
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  L DVT Argatroban drip discontinued 10/03/2018  Allergies  Allergen Reactions  . 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 Lipitor [Atorvastatin] Hives and Itching  . Other   . Viagra  [Sildenafil Citrate]     vision loss    Patient Measurements: Height: 6\' 4"  (193 cm) Weight: (!) 343 lb 14.7 oz (156 kg) IBW/kg (Calculated) : 86.8  Vital Signs: Temp: 98.6 F (37 C) (06/18 2319) BP: 141/98 (06/18 2319) Pulse Rate: 90 (06/18 2319)  Labs: Recent Labs    10/03/18 0827 10/04/18 0542 10/05/18 0506 10/06/18 0611  HGB  --  8.2*  8.2* 7.8*  --   HCT  --  26.6*  25.9* 24.3*  --   PLT  --  472*  450* 354  --   APTT 93*  --   --   --   LABPROT  --  22.2* 24.9* 23.4*  INR  --  2.0* 2.3* 2.1*  CREATININE  --  7.51* 6.63* 5.17*  CKTOTAL  --   --  92  --     Estimated Creatinine Clearance: 24.6 mL/min (A) (by C-G formula based on SCr of 5.17 mg/dL (H)).   Medical History: Past Medical History:  Diagnosis Date  . Allergy   . Anxiety   . Arthritis    knees  . Asthma, mild intermittent, well-controlled   . Chronic obstructive asthma ([place])   . COPD (chronic obstructive pulmonary disease) ([place])   . Depression with anxiety   . Diabetes ([place])   . GERD (gastroesophageal reflux disease)   . HBP (high blood pressure)   . High cholesterol   . Renal cyst, right   . Sleep apnea    CPAP  . Swelling     Medications:  Medications Prior to Admission  Medication Sig Dispense Refill Last Dose  . acetaminophen (TYLENOL) 500 MG tablet Take 1 tablet (500 mg total) by mouth every 6 (six) hours as needed. 90 tablet 0 09/12/2018 at 0800  . albuterol (PROVENTIL HFA;VENTOLIN HFA) 108 (90 Base) MCG/ACT inhaler INHALE 2 PUFFS INTO THE LUNGS 4 (FOUR) TIMES DAILY. 6.7 Inhaler 0 09/12/2018 at 0800  . aspirin 81 MG tablet Take 81 mg by mouth daily.   09/12/2018 at  0800  . atenolol (TENORMIN) 25 MG tablet Take 1 tablet (25 mg total) by mouth every evening. 90 tablet 1 09/11/2018 at 2000  . cephALEXin (KEFLEX) 500 MG capsule Take 500 mg by mouth 3 (three) times daily.   09/12/2018 at 0800  . citalopram (CELEXA) 20 MG tablet Take 1 tablet (20 mg total) by mouth daily. 90 tablet 1 09/12/2018 at 0800  . fluticasone furoate-vilanterol (BREO ELLIPTA) 100-25 MCG/INH AEPB Inhale 1 puff into the lungs daily. 180 each 1 09/12/2018 at 0800  . Multiple Vitamins-Minerals (MENS MULTIVITAMIN PLUS) TABS Take by mouth.   09/12/2018 at 0800  . Omega-3 Fatty Acids (FISH OIL) 1000 MG CAPS Take by mouth daily.   09/12/2018 at 0800  . oxyCODONE-acetaminophen (PERCOCET/ROXICET) 5-325 MG tablet Take 1 tablet by mouth every 4 (four) hours.   09/12/2018 at 0800  . rosuvastatin (CRESTOR) 20 MG tablet TAKE 1 TABLET BY MOUTH EVERY DAY 90 tablet 1 09/12/2018 at 0800  . SENNA-PLUS 8.6-50 MG tablet Take 2 tablets by mouth every morning.   09/12/2018 at 0800  . sulfamethoxazole-trimethoprim (BACTRIM DS) 800-160 MG tablet Take 1 tablet by mouth  2 (two) times a day.   09/12/2018 at 0800  . XARELTO 10 MG TABS tablet Take 10 mg by mouth daily.    09/12/2018 at 0800  . cetirizine (ZYRTEC) 10 MG tablet Take 10 mg by mouth daily.   prn at prn  . diclofenac sodium (VOLTAREN) 1 % GEL APPLY 4 GRAMS TOPICALLY FOUR TIMES A DAY 100 g 2 prn at prn  . lidocaine (XYLOCAINE) 2 % solution Use as directed 15 mLs in the mouth or throat as needed for mouth pain. 100 mL 0 prn at prn  . triamcinolone cream (KENALOG) 0.1 % triamcinolone acetonide 0.1 % topical cream   prn at prn    Assessment: 58 yr male had B/l knee replacements Sep 01, 2018 was admitted with fever, AKI/ Platelet count has been declining since 6/5. He was started on heparin 6/3. Child Pugh score estimated to be 5-6   6/14 @ 0427 aPTT 97, INR 2.6  Warfarin 5 mg 6/15 @ 0506 aPTT 103, INR 2.8  Warfarin 7.5 mg 6/16 INR 2.9  Warfarin 5 mg    *Argatroban  drip d/c 6/17 INR 2.0  Warfarin 7.5 mg 6/18 INR 2.3 Warfarin 6 mg 6/19 INR 2.1   HIT lab resulted 6/8 1740 - Abnormal 1.854, ordered SRA  Goal of Therapy:  aPTT 50-90 seconds Monitor platelets by anticoagulation protocol: Yes INR: 2-3 (however - may elevate higher than this while on Warfarin and Argatroban - it will come back down when argatroban stopped)  Plan:   INR 2.1.Will give patient warfarin 6 mg mg PO x 1 again tonight and will continue to trend INRs and monitor w/ am labs. . Levaquin started which can interact with warfarin. Patient on Levaquin and Daptomycin  Noralee Space, PharmD Clinical Pharmacist 10/06/2018 8:02 AM

## 2018-10-06 NOTE — Progress Notes (Signed)
Subjective:  Patient reports pain as mild.  Doing well  Objective:   VITALS:   Vitals:   10/05/18 0425 10/05/18 0758 10/05/18 1607 10/05/18 2319  BP: (!) 143/81 137/78 (!) 141/89 (!) 141/98  Pulse: 92 96 96 90  Resp: 18 16 17 19   Temp: 99.6 F (37.6 C) 98.7 F (37.1 C) 100.1 F (37.8 C) 98.6 F (37 C)  TempSrc:  Oral    SpO2: 98% 100% 100% 92%  Weight:      Height:        PHYSICAL EXAM:  Neurologically intact ABD soft Neurovascular intact Sensation intact distally Intact pulses distally Dorsiflexion/Plantar flexion intact Incision: dressing C/D/I No cellulitis present Compartment soft Staples removed.  ACE wraps placed  LABS  Results for orders placed or performed during the hospital encounter of 09/12/18 (from the past 24 hour(s))  ACTH stimulation, 3 time points     Status: None   Collection Time: 10/05/18  7:10 AM  Result Value Ref Range   Cortisol, Base 5.2 ug/dL   Cortisol, 30 Min 5.3 ug/dL   Cortisol, 60 Min 5.6 ug/dL  Glucose, capillary     Status: None   Collection Time: 10/05/18  8:00 AM  Result Value Ref Range   Glucose-Capillary 80 70 - 99 mg/dL   Comment 1 Notify RN   Urinalysis, Routine w reflex microscopic     Status: Abnormal   Collection Time: 10/05/18 10:25 AM  Result Value Ref Range   Color, Urine STRAW (A) YELLOW   APPearance CLEAR (A) CLEAR   Specific Gravity, Urine 1.006 1.005 - 1.030   pH 7.0 5.0 - 8.0   Glucose, UA NEGATIVE NEGATIVE mg/dL   Hgb urine dipstick SMALL (A) NEGATIVE   Bilirubin Urine NEGATIVE NEGATIVE   Ketones, ur NEGATIVE NEGATIVE mg/dL   Protein, ur 30 (A) NEGATIVE mg/dL   Nitrite NEGATIVE NEGATIVE   Leukocytes,Ua NEGATIVE NEGATIVE   RBC / HPF 0-5 0 - 5 RBC/hpf   WBC, UA 0-5 0 - 5 WBC/hpf   Bacteria, UA NONE SEEN NONE SEEN   Squamous Epithelial / LPF NONE SEEN 0 - 5   Mucus PRESENT   Glucose, capillary     Status: None   Collection Time: 10/05/18 12:29 PM  Result Value Ref Range   Glucose-Capillary 98 70 -  99 mg/dL   Comment 1 Notify RN   Glucose, capillary     Status: Abnormal   Collection Time: 10/05/18  4:37 PM  Result Value Ref Range   Glucose-Capillary 108 (H) 70 - 99 mg/dL   Comment 1 Notify RN   Glucose, capillary     Status: Abnormal   Collection Time: 10/05/18  9:05 PM  Result Value Ref Range   Glucose-Capillary 115 (H) 70 - 99 mg/dL  Protime-INR     Status: Abnormal   Collection Time: 10/06/18  6:11 AM  Result Value Ref Range   Prothrombin Time 23.4 (H) 11.4 - 15.2 seconds   INR 2.1 (H) 0.8 - 1.2  Basic metabolic panel     Status: Abnormal   Collection Time: 10/06/18  6:11 AM  Result Value Ref Range   Sodium 135 135 - 145 mmol/L   Potassium 3.6 3.5 - 5.1 mmol/L   Chloride 101 98 - 111 mmol/L   CO2 25 22 - 32 mmol/L   Glucose, Bld 137 (H) 70 - 99 mg/dL   BUN 31 (H) 6 - 20 mg/dL   Creatinine, Ser 5.17 (H) 0.61 - 1.24 mg/dL  Calcium 8.2 (L) 8.9 - 10.3 mg/dL   GFR calc non Af Amer 11 (L) >60 mL/min   GFR calc Af Amer 13 (L) >60 mL/min   Anion gap 9 5 - 15    Ct Abdomen Pelvis Wo Contrast  Result Date: 10/04/2018 CLINICAL DATA:  Fever of unknown origin. EXAM: CT CHEST, ABDOMEN AND PELVIS WITHOUT CONTRAST TECHNIQUE: Multidetector CT imaging of the chest, abdomen and pelvis was performed following the standard protocol without IV contrast. COMPARISON:  Abdomen pelvis CT 04/30/2015 FINDINGS: CT CHEST FINDINGS Cardiovascular: The heart size is normal. No substantial pericardial effusion. Coronary artery calcification is evident. No thoracic aortic aneurysm. Mediastinum/Nodes: No mediastinal lymphadenopathy. No evidence for gross hilar lymphadenopathy although assessment is limited by the lack of intravenous contrast on today's study. The esophagus has normal imaging features. There is no axillary lymphadenopathy. Lungs/Pleura: Patchy areas of ground-glass attenuation are identified in the lungs bilaterally. Subsegmental atelectasis noted in the dependent lower lobes bilaterally. No  dense focal airspace consolidation. No pleural effusion. Musculoskeletal: No worrisome lytic or sclerotic osseous abnormality. CT ABDOMEN PELVIS FINDINGS Hepatobiliary: The liver shows diffusely decreased attenuation suggesting steatosis. There is no evidence for gallstones, gallbladder wall thickening, or pericholecystic fluid. No intrahepatic or extrahepatic biliary dilation. Pancreas: No focal mass lesion. No dilatation of the main duct. No intraparenchymal cyst. No peripancreatic edema. Spleen: No splenomegaly. No focal mass lesion. Adrenals/Urinary Tract: 5.2 x 2.6 cm left adrenal mass is new since a chest CT of 07/28/2017. This mass has attenuation too high to allow classification as an adenoma. 3.2 cm low-density lesion in the interpolar right kidney approaches water density and is unchanged in the interval, likely a cyst. Similar exophytic water density lesion in the lower pole right kidney measures 5 cm and is likely a cyst. No evidence for hydroureter. Urinary bladder wall is mildly thickened and ill-defined. Tiny gas bubble in the bladder lumen noted. 4.1 x 2.3 cm nodule in the right adrenal gland is also new in the interval. This has low attenuation suggesting that it represents an adenoma. Stomach/Bowel: Stomach is unremarkable. No gastric wall thickening. No evidence of outlet obstruction. Duodenum is normally positioned as is the ligament of Treitz. No small bowel wall thickening. No small bowel dilatation. The terminal ileum is normal. No gross colonic mass. No colonic wall thickening. Diverticuli are seen scattered along the entire length of the colon without CT findings of diverticulitis. Vascular/Lymphatic: There is abdominal aortic atherosclerosis without aneurysm. There is no gastrohepatic or hepatoduodenal ligament lymphadenopathy. No intraperitoneal or retroperitoneal lymphadenopathy. Upper normal to borderline enlarged lymph nodes are seen in the external iliac pelvic sidewall bilaterally.  Upper normal groin nodes are evident bilaterally. Reproductive: The prostate gland and seminal vesicles are unremarkable. Other: No intraperitoneal free fluid. Musculoskeletal: No worrisome lytic or sclerotic osseous abnormality. Small bilateral groin hernias contain only fat. IMPRESSION: 1. Mild circumferential bladder wall thickening with ill-defined bladder wall thickening. Infection/inflammation would be a consideration. Tiny gas bubble in the bladder lumen is presumably related to recent instrumentation although bladder infection could have this appearance. 2. New 4.1 x 2.3 cm right adrenal nodule with low attenuation most suggestive of benign adrenal adenoma. 5.2 x 2.6 cm left adrenal also mass new since 07/28/2017. This has attenuation too high to allow classification as an adenoma on this noncontrast CT. Follow-up CT could be used to ensure stability or MRI of the abdomen without and with contrast could be used to further evaluate for lipid poor adenoma. 3. Patchy areas of  ground-glass attenuation in both lungs. Imaging features are nonspecific and may be related to an infectious or inflammatory alveolitis. 4. Upper normal to borderline enlarged lymph nodes in the external iliac chains bilaterally, nonspecific but potentially reactive. 5. Hepatic steatosis. 6. Bilateral renal cysts. Electronically Signed   By: Misty Stanley M.D.   On: 10/04/2018 17:51   Ct Head Wo Contrast  Result Date: 10/04/2018 CLINICAL DATA:  Dizziness and weakness.  Fall. EXAM: CT HEAD WITHOUT CONTRAST TECHNIQUE: Contiguous axial images were obtained from the base of the skull through the vertex without intravenous contrast. COMPARISON:  Head CT 09/14/2018 FINDINGS: Brain: There is no mass, hemorrhage or extra-axial collection. The size and configuration of the ventricles and extra-axial CSF spaces are normal. The brain parenchyma is normal, without acute or chronic infarction. Vascular: No abnormal hyperdensity of the major  intracranial arteries or dural venous sinuses. No intracranial atherosclerosis. Skull: The visualized skull base, calvarium and extracranial soft tissues are normal. Sinuses/Orbits: Small amount of fluid at the left mastoid tip. No middle ear effusion. Paranasal sinuses are clear. The orbits are normal. IMPRESSION: Normal brain. Electronically Signed   By: Ulyses Jarred M.D.   On: 10/04/2018 15:04   Ct Chest Wo Contrast  Result Date: 10/04/2018 CLINICAL DATA:  Fever of unknown origin. EXAM: CT CHEST, ABDOMEN AND PELVIS WITHOUT CONTRAST TECHNIQUE: Multidetector CT imaging of the chest, abdomen and pelvis was performed following the standard protocol without IV contrast. COMPARISON:  Abdomen pelvis CT 04/30/2015 FINDINGS: CT CHEST FINDINGS Cardiovascular: The heart size is normal. No substantial pericardial effusion. Coronary artery calcification is evident. No thoracic aortic aneurysm. Mediastinum/Nodes: No mediastinal lymphadenopathy. No evidence for gross hilar lymphadenopathy although assessment is limited by the lack of intravenous contrast on today's study. The esophagus has normal imaging features. There is no axillary lymphadenopathy. Lungs/Pleura: Patchy areas of ground-glass attenuation are identified in the lungs bilaterally. Subsegmental atelectasis noted in the dependent lower lobes bilaterally. No dense focal airspace consolidation. No pleural effusion. Musculoskeletal: No worrisome lytic or sclerotic osseous abnormality. CT ABDOMEN PELVIS FINDINGS Hepatobiliary: The liver shows diffusely decreased attenuation suggesting steatosis. There is no evidence for gallstones, gallbladder wall thickening, or pericholecystic fluid. No intrahepatic or extrahepatic biliary dilation. Pancreas: No focal mass lesion. No dilatation of the main duct. No intraparenchymal cyst. No peripancreatic edema. Spleen: No splenomegaly. No focal mass lesion. Adrenals/Urinary Tract: 5.2 x 2.6 cm left adrenal mass is new since a  chest CT of 07/28/2017. This mass has attenuation too high to allow classification as an adenoma. 3.2 cm low-density lesion in the interpolar right kidney approaches water density and is unchanged in the interval, likely a cyst. Similar exophytic water density lesion in the lower pole right kidney measures 5 cm and is likely a cyst. No evidence for hydroureter. Urinary bladder wall is mildly thickened and ill-defined. Tiny gas bubble in the bladder lumen noted. 4.1 x 2.3 cm nodule in the right adrenal gland is also new in the interval. This has low attenuation suggesting that it represents an adenoma. Stomach/Bowel: Stomach is unremarkable. No gastric wall thickening. No evidence of outlet obstruction. Duodenum is normally positioned as is the ligament of Treitz. No small bowel wall thickening. No small bowel dilatation. The terminal ileum is normal. No gross colonic mass. No colonic wall thickening. Diverticuli are seen scattered along the entire length of the colon without CT findings of diverticulitis. Vascular/Lymphatic: There is abdominal aortic atherosclerosis without aneurysm. There is no gastrohepatic or hepatoduodenal ligament lymphadenopathy. No intraperitoneal or  retroperitoneal lymphadenopathy. Upper normal to borderline enlarged lymph nodes are seen in the external iliac pelvic sidewall bilaterally. Upper normal groin nodes are evident bilaterally. Reproductive: The prostate gland and seminal vesicles are unremarkable. Other: No intraperitoneal free fluid. Musculoskeletal: No worrisome lytic or sclerotic osseous abnormality. Small bilateral groin hernias contain only fat. IMPRESSION: 1. Mild circumferential bladder wall thickening with ill-defined bladder wall thickening. Infection/inflammation would be a consideration. Tiny gas bubble in the bladder lumen is presumably related to recent instrumentation although bladder infection could have this appearance. 2. New 4.1 x 2.3 cm right adrenal nodule with  low attenuation most suggestive of benign adrenal adenoma. 5.2 x 2.6 cm left adrenal also mass new since 07/28/2017. This has attenuation too high to allow classification as an adenoma on this noncontrast CT. Follow-up CT could be used to ensure stability or MRI of the abdomen without and with contrast could be used to further evaluate for lipid poor adenoma. 3. Patchy areas of ground-glass attenuation in both lungs. Imaging features are nonspecific and may be related to an infectious or inflammatory alveolitis. 4. Upper normal to borderline enlarged lymph nodes in the external iliac chains bilaterally, nonspecific but potentially reactive. 5. Hepatic steatosis. 6. Bilateral renal cysts. Electronically Signed   By: Misty Stanley M.D.   On: 10/04/2018 17:51   Mr Brain Wo Contrast  Result Date: 10/05/2018 CLINICAL DATA:  Altered level of consciousness. EXAM: MRI HEAD WITHOUT CONTRAST TECHNIQUE: Multiplanar, multiecho pulse sequences of the brain and surrounding structures were obtained without intravenous contrast. COMPARISON:  CT head 10/04/2018 FINDINGS: Brain: Ventricle size and cerebral volume normal. Negative for acute infarct, hemorrhage, mass. Scattered small white matter hyperintensities bilaterally likely due to microvascular ischemia. Vascular: Normal arterial flow voids Skull and upper cervical spine: Negative Sinuses/Orbits: Mild mucosal edema paranasal sinuses. Left mastoid effusion. Normal orbit. Other: None IMPRESSION: No acute abnormality. Mild chronic white matter changes likely due to microvascular ischemia. Electronically Signed   By: Franchot Gallo M.D.   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

## 2018-10-06 NOTE — Progress Notes (Signed)
[facility]  ROUNDING NOTE   Subjective:   Tmax 100.1  Wife at bedside  UOP 4641mL Creatinine 5.17 (6.63) (7.51)  Objective:  Vital signs in last 24 hours:  Temp:  [98.5 F (36.9 C)-100.1 F (37.8 C)] 98.5 F (36.9 C) (06/19 0814) Pulse Rate:  [90-96] 93 (06/19 0814) Resp:  [17-19] 17 (06/19 0814) BP: (135-141)/(87-98) 135/87 (06/19 0814) SpO2:  [92 %-100 %] 100 % (06/19 0814)  Weight change:  Filed Weights   09/29/18 0630 09/29/18 1445 09/29/18 1823  Weight: (!) 157.1 kg (!) 157 kg (!) 156 kg    Intake/Output: I/O last 3 completed shifts: In: 2355.4 [P.O.:240; I.V.:2024.2; IV Piggyback:91.2] Out: 2778 [Urine:6745]   Intake/Output this shift:  Total I/O In: 781.5 [I.V.:667.5; IV Piggyback:114] Out: -   Physical Exam: General: NAD  Head: Moist oral mucosal membranes  Eyes: Anicteric   Lungs:  clear  Heart: regular  Abdomen:  Soft, nontender, obese   Extremities: + peripheral edema  Neurologic: Awake, alert, conversant  Skin: warm        Basic Metabolic Panel: Recent Labs  Lab 09/29/18 1657  10/01/18 0745 10/02/18 0506 10/03/18 0302 10/04/18 0542 10/05/18 0506 10/06/18 0611  NA  --    < > 134* 137 138 138 137 135  K  --    < > 3.4* 3.4* 3.7 3.4* 3.1* 3.6  CL  --    < > 98 99 101 102 103 101  CO2  --    < > 25 24 24 22  21* 25  GLUCOSE  --    < > 94 106* 101* 94 89 137*  BUN  --    < > 37* 36* 37* 37* 35* 31*  CREATININE  --    < > 8.74* 8.63* 8.38* 7.51* 6.63* 5.17*  CALCIUM  --    < > 7.9* 8.3* 8.5* 8.6* 8.3* 8.2*  MG  --   --  1.7 1.9  --   --   --   --   PHOS 5.3*  --   --   --   --   --   --   --    < > = values in this interval not displayed.    Liver Function Tests: Recent Labs  Lab 09/29/18 1642  AST 38  ALT 22  ALKPHOS 58  BILITOT 0.7  PROT 5.6*  ALBUMIN 1.8*   No results for input(s): LIPASE, AMYLASE in the last 168 hours. No results for input(s): AMMONIA in the last 168 hours.  CBC: Recent Labs  Lab  10/01/18 0427 10/02/18 0506 10/03/18 0302 10/04/18 0542 10/05/18 0506  WBC 10.9* 10.3 10.4 10.2  10.4 8.3  NEUTROABS  --   --   --  5.7 4.3  HGB 8.5* 8.7* 8.8* 8.2*  8.2* 7.8*  HCT 27.0* 27.1* 27.8* 26.6*  25.9* 24.3*  MCV 84.1 81.6 82.0 85.0  82.5 82.1  PLT 499* 503* 494* 472*  450* 354    Cardiac Enzymes: Recent Labs  Lab 10/02/18 0506 10/05/18 0506  CKTOTAL 114 92    BNP: Invalid input(s): POCBNP  CBG: Recent Labs  Lab 10/05/18 1229 10/05/18 1637 10/05/18 2105 10/06/18 0816 10/06/18 1227  GLUCAP 98 108* 115* 105* 117*    Microbiology: Results for orders placed or performed during the hospital encounter of 09/12/18  Blood Culture (routine x 2)     Status: None   Collection Time: 09/12/18  9:56 AM   Specimen: BLOOD LEFT HAND  Result Value Ref Range Status   Specimen Description BLOOD LEFT HAND  Final   Special Requests   Final    BOTTLES DRAWN AEROBIC AND ANAEROBIC Blood Culture adequate volume   Culture   Final    NO GROWTH 5 DAYS Performed at [facility], [address] [place], [place] [address]    Report Status 09/17/2018 FINAL  Final  Urine culture     Status: None   Collection Time: 09/12/18 10:01 AM   Specimen: Urine, Random  Result Value Ref Range Status   Specimen Description   Final    URINE, RANDOM Performed at [facility], [address] [place], [place] [address]    Special Requests   Final    NONE Performed at [facility], [address] [place], [place] [address]    Culture   Final    NO GROWTH Performed at [facility], [place] [address], [place],  [address]    Report Status 09/13/2018 FINAL  Final  SARS Coronavirus 2 (CEPHEID- Performed in [place] hospital lab), Hosp Order     Status: None   Collection Time: 09/12/18 10:02 AM   Specimen: Nasopharyngeal Swab  Result Value Ref Range Status   SARS Coronavirus 2 NEGATIVE NEGATIVE Final    Comment: (NOTE) If result is  NEGATIVE SARS-CoV-2 target nucleic acids are NOT DETECTED. The SARS-CoV-2 RNA is generally detectable in upper and lower  respiratory specimens during the acute phase of infection. The lowest  concentration of SARS-CoV-2 viral copies this assay can detect is 250  copies / mL. A negative result does not preclude SARS-CoV-2 infection  and should not be used as the sole basis for treatment or other  patient management decisions.  A negative result may occur with  improper specimen collection / handling, submission of specimen other  than nasopharyngeal swab, presence of viral mutation(s) within the  areas targeted by this assay, and inadequate number of viral copies  (<250 copies / mL). A negative result must be combined with clinical  observations, patient history, and epidemiological information. If result is POSITIVE SARS-CoV-2 target nucleic acids are DETECTED. The SARS-CoV-2 RNA is generally detectable in upper and lower  respiratory specimens dur ing the acute phase of infection.  Positive  results are indicative of active infection with SARS-CoV-2.  Clinical  correlation with patient history and other diagnostic information is  necessary to determine patient infection status.  Positive results do  not rule out bacterial infection or co-infection with other viruses. If result is PRESUMPTIVE POSTIVE SARS-CoV-2 nucleic acids MAY BE PRESENT.   A presumptive positive result was obtained on the submitted specimen  and confirmed on repeat testing.  While 2019 novel coronavirus  (SARS-CoV-2) nucleic acids may be present in the submitted sample  additional confirmatory testing may be necessary for epidemiological  and / or clinical management purposes  to differentiate between  SARS-CoV-2 and other Sarbecovirus currently known to infect humans.  If clinically indicated additional testing with an alternate test  methodology [phone]) is advised. The SARS-CoV-2 RNA is generally  detectable  in upper and lower respiratory sp ecimens during the acute  phase of infection. The expected result is Negative. Fact Sheet for Patients:  StrictlyIdeas.no Fact Sheet for Healthcare Providers: BankingDealers.co.za This test is not yet approved or cleared by the [place] FDA and has been authorized for detection and/or diagnosis of SARS-CoV-2 by FDA under an Emergency Use Authorization (EUA).  This EUA will remain  in effect (meaning this test can be used) for the duration of the COVID-19 declaration under Section 564(b)(1) of the Act, 21 U.S.C. section 360bbb-3(b)(1), unless the authorization is terminated or revoked sooner. Performed at [facility], [address] [place], [place] [address]   Blood Culture (routine x 2)     Status: None   Collection Time: 09/12/18 10:06 AM   Specimen: BLOOD  Result Value Ref Range Status   Specimen Description BLOOD LEFT ANTECUBITAL  Final   Special Requests   Final    BOTTLES DRAWN AEROBIC AND ANAEROBIC Blood Culture adequate volume   Culture   Final    NO GROWTH 5 DAYS Performed at [facility], [address] [place], [place] [address]    Report Status 09/17/2018 FINAL  Final  MRSA PCR Screening     Status: None   Collection Time: 09/12/18  7:04 PM   Specimen: Nasal Mucosa; Nasopharyngeal  Result Value Ref Range Status   MRSA by PCR NEGATIVE NEGATIVE Final    Comment:        The GeneXpert MRSA Assay (FDA approved for NASAL specimens only), is one component of a comprehensive MRSA colonization surveillance program. It is not intended to diagnose MRSA infection nor to guide or monitor treatment for MRSA infections. Performed at [facility], [address] [place], [place] [address]   C difficile quick scan w PCR reflex     Status: None   Collection Time: 09/14/18  2:21 PM   Specimen: STOOL  Result Value Ref Range Status   C Diff antigen NEGATIVE  NEGATIVE Final   C Diff toxin NEGATIVE NEGATIVE Final   C Diff interpretation No C. difficile detected.  Final    Comment: Performed at [facility], [address] [place], [place] [address]  Group A Strep by PCR     Status: None   Collection Time: 09/15/18  1:53 PM  Result Value Ref Range Status   Group A Strep by PCR NOT DETECTED NOT DETECTED Final    Comment: Performed at [facility], [address] [place], [place] [address]  Body fluid culture     Status: None   Collection Time: 09/15/18  5:13 PM   Specimen: KNEE  Result Value Ref Range Status   Specimen Description   Final    KNEE Performed at [facility], [address], [place], [place] [address]    Special Requests   Final    NONE Performed at [facility], [address] [place], [place] [address]    Gram Stain   Final    RARE RBCS FEW WBC SEEN NO ORGANISMS SEEN Performed at [facility], [address], [place], [place] [address]    Culture   Final    NO GROWTH 3 DAYS Performed at [facility], [place] [address] [place], [place] [address]    Report Status 09/19/2018 FINAL  Final  Body fluid culture     Status: None   Collection Time: 09/16/18  8:55 AM   Specimen: KNEE  Result Value Ref Range Status   Specimen Description KNEE RIGHT  Final   Special Requests NONE  Final   Gram Stain   Final    RARE WBC PRESENT, PREDOMINANTLY PMN NO ORGANISMS SEEN    Culture   Final    NO GROWTH 3 DAYS Performed at [facility], [place] [address] [place],  [address]    Report  Status 09/20/2018 FINAL  Final  Aerobic/Anaerobic Culture (surgical/deep wound)     Status: None   Collection Time: 09/17/18 11:32 AM   Specimen: Wound  Result Value Ref Range Status   Specimen Description   Final    TISSUE LEFT KNEE 1 Performed at [facility] [address] [place], [place] [address]    Special Requests PATIENT ON FOLLOWING MAXIPIME VANCOMYCIN   Final   Gram Stain   Final    FEW WBC PRESENT,BOTH PMN AND MONONUCLEAR NO ORGANISMS SEEN    Culture   Final    No growth aerobically or anaerobically. Performed at [facility], [place] [address] [place], [place] [address]    Report Status 09/22/2018 FINAL  Final  Acid Fast Smear (AFB)     Status: None   Collection Time: 09/17/18 11:32 AM   Specimen: Synovium  Result Value Ref Range Status   AFB Specimen Processing Comment  Final    Comment: Tissue Grinding and Digestion/Decontamination   Acid Fast Smear Negative  Final    Comment: (NOTE) Performed At: [facility] [place], [place] [id number] Rush Farmer MD AY:[id number]    Source (AFB) TISSUE  Final    Comment: LEFT KNEE Performed at [facility], [place] [address] [place], [place] [address]   Culture, fungus without smear     Status: None (Preliminary result)   Collection Time: 09/17/18 11:34 AM   Specimen: Synovium; Other  Result Value Ref Range Status   Specimen Description   Final    SYNOVIAL Performed at [facility], [address] [place], [place] [address]    Special Requests   Final    NONE Performed at [facility], [address] [place], [place] [address]    Culture   Final    NO FUNGUS ISOLATED AFTER 14 DAYS Performed at [facility], [place] [address], [place], [place] [address]    Report Status PENDING  Incomplete  Acid Fast Culture with reflexed sensitivities     Status: None   Collection Time: 09/17/18 11:36 AM   Specimen: Synovium  Result Value Ref Range Status   Acid Fast Culture CANC  Final    Comment: (NOTE) Test cancelled at client's request.      Cancelled per Levin Erp 09/21/2018 Performed At: [facility] [place], [place] [id number] Rush Farmer MD [id number]    Source of Sample TISSUE  Final    Comment: RIGHT KNEE Performed at [facility], [place] [address] [place], [place] [address]    Aerobic/Anaerobic Culture (surgical/deep wound)     Status: None   Collection Time: 09/17/18 11:43 AM   Specimen: Wound  Result Value Ref Range Status   Specimen Description   Final    TISSUE LEFT KNEE 2 Performed at [facility], [address] [place], [place] [address]    Special Requests PATIENT ON FOLLOWING MAXIPIME VANCOMYCIN  Final   Gram Stain   Final    FEW WBC PRESENT, PREDOMINANTLY PMN NO ORGANISMS SEEN    Culture   Final    No growth aerobically or anaerobically. Performed at [facility], [place] [address], [place],  [address]    Report Status 09/22/2018 FINAL  Final  Aerobic/Anaerobic Culture (surgical/deep wound)     Status: None   Collection Time: 09/17/18 11:44 AM   Specimen: Wound  Result Value Ref Range Status   Specimen Description  Final    TISSUE LEFT KNEE 3 Performed at [facility], [address] [place], [place] [address]    Special Requests PATIENT ON FOLLOWING MAXIPIME VANCOMYCIN  Final   Gram Stain   Final    MODERATE WBC PRESENT, PREDOMINANTLY PMN NO ORGANISMS SEEN    Culture   Final    No growth aerobically or anaerobically. Performed at [facility], [place] [address], [place], [place] [address]    Report Status 09/22/2018 FINAL  Final  Aerobic/Anaerobic Culture (surgical/deep wound)     Status: None   Collection Time: 09/18/18  9:43 PM   Specimen: [facility] Other; Tissue  Result Value Ref Range Status   Specimen Description   Final    SYNOVIAL Performed at [facility], [address], [place], [place] [address]    Special Requests   Final    NONE Performed at [facility], [address] [place], [place] [address]    Gram Stain   Final    RARE WBC PRESENT, PREDOMINANTLY MONONUCLEAR NO ORGANISMS SEEN    Culture   Final    No growth aerobically or anaerobically. Performed at [facility], [place] [address] [place] [address] [place], [place] [address]    Report Status 09/24/2018 FINAL  Final   Aerobic/Anaerobic Culture (surgical/deep wound)     Status: None   Collection Time: 09/18/18  9:44 PM   Specimen: [facility] Other; Tissue  Result Value Ref Range Status   Specimen Description   Final    SYNOVIAL Performed at [facility], [address], [place], [place] [address]    Special Requests   Final    RIGHT KNEE Performed at [facility], [place], [place] [address]    Gram Stain   Final    FEW WBC PRESENT,BOTH PMN AND MONONUCLEAR NO ORGANISMS SEEN    Culture   Final    No growth aerobically or anaerobically. Performed at [facility], [place] [address], [place], [place] [address]    Report Status 09/24/2018 FINAL  Final  Aerobic/Anaerobic Culture (surgical/deep wound)     Status: None   Collection Time: 09/18/18  9:45 PM   Specimen: [facility] Other; Tissue  Result Value Ref Range Status   Specimen Description   Final    SYNOVIAL Performed at [facility], [address], [place], [place] [address]    Special Requests   Final    RIGHT KNEE Performed at [facility], [address] [place], [place] [address]    Gram Stain   Final    FEW WBC PRESENT, PREDOMINANTLY MONONUCLEAR NO ORGANISMS SEEN    Culture   Final    No growth aerobically or anaerobically. Performed at [facility], [place] [address], [place], [place] [address]    Report Status 09/24/2018 FINAL  Final  Culture, blood (Routine X 2) w Reflex to ID Panel     Status: None   Collection Time: 09/19/18  1:11 PM   Specimen: BLOOD  Result Value Ref Range Status   Specimen Description BLOOD LEFT ANTECUBITAL  Final   Special Requests   Final    BOTTLES DRAWN AEROBIC AND ANAEROBIC Blood Culture results may not be optimal due to an excessive volume of blood received in culture bottles   Culture   Final    NO GROWTH 5 DAYS Performed at [facility], [address] [place], [place] [address]    Report Status  09/24/2018 FINAL  Final  Culture,  blood (Routine X 2) w Reflex to ID Panel     Status: None   Collection Time: 09/19/18  1:20 PM   Specimen: BLOOD  Result Value Ref Range Status   Specimen Description BLOOD BLOOD RIGHT HAND  Final   Special Requests   Final    BOTTLES DRAWN AEROBIC AND ANAEROBIC Blood Culture results may not be optimal due to an excessive volume of blood received in culture bottles   Culture   Final    NO GROWTH 5 DAYS Performed at [facility], [address] [place], [place] [address]    Report Status 09/24/2018 FINAL  Final  Urine Culture     Status: None   Collection Time: 09/20/18  3:59 PM   Specimen: Urine, Random  Result Value Ref Range Status   Specimen Description   Final    URINE, RANDOM Performed at [facility], [address], [place], [place] [address]    Special Requests   Final    NONE Performed at [facility], [address], [place], [place] [address]    Culture   Final    NO GROWTH Performed at [facility], [place] [address], [place], [place] [address]    Report Status 09/21/2018 FINAL  Final  C difficile quick scan w PCR reflex     Status: None   Collection Time: 09/21/18  3:27 PM   Specimen: STOOL  Result Value Ref Range Status   C Diff antigen NEGATIVE NEGATIVE Final   C Diff toxin NEGATIVE NEGATIVE Final   C Diff interpretation No C. difficile detected.  Final    Comment: Performed at [facility], [address] [place], [place] [address]  Gastrointestinal Panel by PCR , Stool     Status: None   Collection Time: 09/21/18  3:27 PM   Specimen: Stool  Result Value Ref Range Status   Campylobacter species NOT DETECTED NOT DETECTED Final   Plesimonas shigelloides NOT DETECTED NOT DETECTED Final   Salmonella species NOT DETECTED NOT DETECTED Final   Yersinia enterocolitica NOT DETECTED NOT DETECTED Final   Vibrio species NOT DETECTED NOT DETECTED Final   Vibrio cholerae NOT DETECTED NOT DETECTED Final   Enteroaggregative E  coli (EAEC) NOT DETECTED NOT DETECTED Final   Enteropathogenic E coli (EPEC) NOT DETECTED NOT DETECTED Final   Enterotoxigenic E coli (ETEC) NOT DETECTED NOT DETECTED Final   Shiga like toxin producing E coli (STEC) NOT DETECTED NOT DETECTED Final   Shigella/Enteroinvasive E coli (EIEC) NOT DETECTED NOT DETECTED Final   Cryptosporidium NOT DETECTED NOT DETECTED Final   Cyclospora cayetanensis NOT DETECTED NOT DETECTED Final   Entamoeba histolytica NOT DETECTED NOT DETECTED Final   Giardia lamblia NOT DETECTED NOT DETECTED Final   Adenovirus F40/41 NOT DETECTED NOT DETECTED Final   Astrovirus NOT DETECTED NOT DETECTED Final   Norovirus GI/GII NOT DETECTED NOT DETECTED Final   Rotavirus A NOT DETECTED NOT DETECTED Final   Sapovirus (I, II, IV, and V) NOT DETECTED NOT DETECTED Final    Comment: Performed at [facility], [address] [place], [place] [address]  Novel Coronavirus, NAA (hospital order; send-out to ref lab)     Status: None   Collection Time: 09/29/18  4:36 PM   Specimen: Nasopharyngeal Swab; Respiratory  Result Value Ref Range Status   SARS-CoV-2, NAA NOT DETECTED NOT DETECTED Final    Comment: (NOTE) This test was developed and  its performance characteristics determined by [employer]. This test has not been FDA cleared or approved. This test has been authorized by FDA under an Emergency Use Authorization (EUA). This test is only authorized for the duration of time the declaration that circumstances exist justifying the authorization of the emergency use of in vitro diagnostic tests for detection of SARS-CoV-2 virus and/or diagnosis of COVID-19 infection under section 564(b)(1) of the Act, 21 U.S.C. 950DTO-6(Z)(1), unless the authorization is terminated or revoked sooner. When diagnostic testing is negative, the possibility of a false negative result should be considered in the context of a patient's recent exposures and the presence of clinical  signs and symptoms consistent with COVID-19. An individual without symptoms of COVID-19 and who is not shedding SARS-CoV-2 virus would expect to have a negative (not detected) result in this assay. Performed  At: [facility] [address] [place], [place] [id number] Rush Farmer MD [id number]    [place]  Final    Comment: Performed at [facility], [address] [place], [place] [address]  CULTURE, BLOOD (ROUTINE X 2) w Reflex to ID Panel     Status: None (Preliminary result)   Collection Time: 10/02/18  5:26 PM   Specimen: BLOOD  Result Value Ref Range Status   Specimen Description BLOOD BLOOD RIGHT FOREARM  Final   Special Requests   Final    BOTTLES DRAWN AEROBIC AND ANAEROBIC Blood Culture adequate volume   Culture   Final    NO GROWTH 4 DAYS Performed at [facility], [address] [place], [place] [address]    Report Status PENDING  Incomplete  CULTURE, BLOOD (ROUTINE X 2) w Reflex to ID Panel     Status: None (Preliminary result)   Collection Time: 10/02/18  5:41 PM   Specimen: BLOOD  Result Value Ref Range Status   Specimen Description BLOOD BLOOD RIGHT HAND  Final   Special Requests   Final    BOTTLES DRAWN AEROBIC AND ANAEROBIC Blood Culture adequate volume   Culture   Final    NO GROWTH 4 DAYS Performed at [facility], [address], [place], [place] [address]    Report Status PENDING  Incomplete    Coagulation Studies: Recent Labs    10/04/18 0542 10/05/18 0506 10/06/18 0611  LABPROT 22.2* 24.9* 23.4*  INR 2.0* 2.3* 2.1*    Urinalysis: Recent Labs    10/05/18 1025  COLORURINE STRAW*  LABSPEC 1.006  PHURINE 7.0  GLUCOSEU NEGATIVE  HGBUR SMALL*  BILIRUBINUR NEGATIVE  KETONESUR NEGATIVE  PROTEINUR 30*  NITRITE NEGATIVE  LEUKOCYTESUR NEGATIVE      Imaging: Ct Abdomen Pelvis Wo Contrast  Result Date: 10/04/2018 CLINICAL DATA:  Fever of unknown origin. EXAM: CT CHEST,  ABDOMEN AND PELVIS WITHOUT CONTRAST TECHNIQUE: Multidetector CT imaging of the chest, abdomen and pelvis was performed following the standard protocol without IV contrast. COMPARISON:  Abdomen pelvis CT 04/30/2015 FINDINGS: CT CHEST FINDINGS Cardiovascular: The heart size is normal. No substantial pericardial effusion. Coronary artery calcification is evident. No thoracic aortic aneurysm. Mediastinum/Nodes: No mediastinal lymphadenopathy. No evidence for gross hilar lymphadenopathy although assessment is limited by the lack of intravenous contrast on today's study. The esophagus has normal imaging features. There is no axillary lymphadenopathy. Lungs/Pleura: Patchy areas of ground-glass attenuation are identified in the lungs bilaterally. Subsegmental atelectasis noted in the dependent lower lobes bilaterally. No dense focal airspace consolidation. No pleural effusion. Musculoskeletal: No worrisome lytic or sclerotic osseous abnormality. CT ABDOMEN  PELVIS FINDINGS Hepatobiliary: The liver shows diffusely decreased attenuation suggesting steatosis. There is no evidence for gallstones, gallbladder wall thickening, or pericholecystic fluid. No intrahepatic or extrahepatic biliary dilation. Pancreas: No focal mass lesion. No dilatation of the main duct. No intraparenchymal cyst. No peripancreatic edema. Spleen: No splenomegaly. No focal mass lesion. Adrenals/Urinary Tract: 5.2 x 2.6 cm left adrenal mass is new since a chest CT of 07/28/2017. This mass has attenuation too high to allow classification as an adenoma. 3.2 cm low-density lesion in the interpolar right kidney approaches water density and is unchanged in the interval, likely a cyst. Similar exophytic water density lesion in the lower pole right kidney measures 5 cm and is likely a cyst. No evidence for hydroureter. Urinary bladder wall is mildly thickened and ill-defined. Tiny gas bubble in the bladder lumen noted. 4.1 x 2.3 cm nodule in the right adrenal  gland is also new in the interval. This has low attenuation suggesting that it represents an adenoma. Stomach/Bowel: Stomach is unremarkable. No gastric wall thickening. No evidence of outlet obstruction. Duodenum is normally positioned as is the ligament of Treitz. No small bowel wall thickening. No small bowel dilatation. The terminal ileum is normal. No gross colonic mass. No colonic wall thickening. Diverticuli are seen scattered along the entire length of the colon without CT findings of diverticulitis. Vascular/Lymphatic: There is abdominal aortic atherosclerosis without aneurysm. There is no gastrohepatic or hepatoduodenal ligament lymphadenopathy. No intraperitoneal or retroperitoneal lymphadenopathy. Upper normal to borderline enlarged lymph nodes are seen in the external iliac pelvic sidewall bilaterally. Upper normal groin nodes are evident bilaterally. Reproductive: The prostate gland and seminal vesicles are unremarkable. Other: No intraperitoneal free fluid. Musculoskeletal: No worrisome lytic or sclerotic osseous abnormality. Small bilateral groin hernias contain only fat. IMPRESSION: 1. Mild circumferential bladder wall thickening with ill-defined bladder wall thickening. Infection/inflammation would be a consideration. Tiny gas bubble in the bladder lumen is presumably related to recent instrumentation although bladder infection could have this appearance. 2. New 4.1 x 2.3 cm right adrenal nodule with low attenuation most suggestive of benign adrenal adenoma. 5.2 x 2.6 cm left adrenal also mass new since 07/28/2017. This has attenuation too high to allow classification as an adenoma on this noncontrast CT. Follow-up CT could be used to ensure stability or MRI of the abdomen without and with contrast could be used to further evaluate for lipid poor adenoma. 3. Patchy areas of ground-glass attenuation in both lungs. Imaging features are nonspecific and may be related to an infectious or inflammatory  alveolitis. 4. Upper normal to borderline enlarged lymph nodes in the external iliac chains bilaterally, nonspecific but potentially reactive. 5. Hepatic steatosis. 6. Bilateral renal cysts. Electronically Signed   By: Misty Stanley M.D.   On: 10/04/2018 17:51   Ct Head Wo Contrast  Result Date: 10/04/2018 CLINICAL DATA:  Dizziness and weakness.  Fall. EXAM: CT HEAD WITHOUT CONTRAST TECHNIQUE: Contiguous axial images were obtained from the base of the skull through the vertex without intravenous contrast. COMPARISON:  Head CT 09/14/2018 FINDINGS: Brain: There is no mass, hemorrhage or extra-axial collection. The size and configuration of the ventricles and extra-axial CSF spaces are normal. The brain parenchyma is normal, without acute or chronic infarction. Vascular: No abnormal hyperdensity of the major intracranial arteries or dural venous sinuses. No intracranial atherosclerosis. Skull: The visualized skull base, calvarium and extracranial soft tissues are normal. Sinuses/Orbits: Small amount of fluid at the left mastoid tip. No middle ear effusion. Paranasal sinuses are clear. The  orbits are normal. IMPRESSION: Normal brain. Electronically Signed   By: Ulyses Jarred M.D.   On: 10/04/2018 15:04   Ct Chest Wo Contrast  Result Date: 10/04/2018 CLINICAL DATA:  Fever of unknown origin. EXAM: CT CHEST, ABDOMEN AND PELVIS WITHOUT CONTRAST TECHNIQUE: Multidetector CT imaging of the chest, abdomen and pelvis was performed following the standard protocol without IV contrast. COMPARISON:  Abdomen pelvis CT 04/30/2015 FINDINGS: CT CHEST FINDINGS Cardiovascular: The heart size is normal. No substantial pericardial effusion. Coronary artery calcification is evident. No thoracic aortic aneurysm. Mediastinum/Nodes: No mediastinal lymphadenopathy. No evidence for gross hilar lymphadenopathy although assessment is limited by the lack of intravenous contrast on today's study. The esophagus has normal imaging features.  There is no axillary lymphadenopathy. Lungs/Pleura: Patchy areas of ground-glass attenuation are identified in the lungs bilaterally. Subsegmental atelectasis noted in the dependent lower lobes bilaterally. No dense focal airspace consolidation. No pleural effusion. Musculoskeletal: No worrisome lytic or sclerotic osseous abnormality. CT ABDOMEN PELVIS FINDINGS Hepatobiliary: The liver shows diffusely decreased attenuation suggesting steatosis. There is no evidence for gallstones, gallbladder wall thickening, or pericholecystic fluid. No intrahepatic or extrahepatic biliary dilation. Pancreas: No focal mass lesion. No dilatation of the main duct. No intraparenchymal cyst. No peripancreatic edema. Spleen: No splenomegaly. No focal mass lesion. Adrenals/Urinary Tract: 5.2 x 2.6 cm left adrenal mass is new since a chest CT of 07/28/2017. This mass has attenuation too high to allow classification as an adenoma. 3.2 cm low-density lesion in the interpolar right kidney approaches water density and is unchanged in the interval, likely a cyst. Similar exophytic water density lesion in the lower pole right kidney measures 5 cm and is likely a cyst. No evidence for hydroureter. Urinary bladder wall is mildly thickened and ill-defined. Tiny gas bubble in the bladder lumen noted. 4.1 x 2.3 cm nodule in the right adrenal gland is also new in the interval. This has low attenuation suggesting that it represents an adenoma. Stomach/Bowel: Stomach is unremarkable. No gastric wall thickening. No evidence of outlet obstruction. Duodenum is normally positioned as is the ligament of Treitz. No small bowel wall thickening. No small bowel dilatation. The terminal ileum is normal. No gross colonic mass. No colonic wall thickening. Diverticuli are seen scattered along the entire length of the colon without CT findings of diverticulitis. Vascular/Lymphatic: There is abdominal aortic atherosclerosis without aneurysm. There is no gastrohepatic  or hepatoduodenal ligament lymphadenopathy. No intraperitoneal or retroperitoneal lymphadenopathy. Upper normal to borderline enlarged lymph nodes are seen in the external iliac pelvic sidewall bilaterally. Upper normal groin nodes are evident bilaterally. Reproductive: The prostate gland and seminal vesicles are unremarkable. Other: No intraperitoneal free fluid. Musculoskeletal: No worrisome lytic or sclerotic osseous abnormality. Small bilateral groin hernias contain only fat. IMPRESSION: 1. Mild circumferential bladder wall thickening with ill-defined bladder wall thickening. Infection/inflammation would be a consideration. Tiny gas bubble in the bladder lumen is presumably related to recent instrumentation although bladder infection could have this appearance. 2. New 4.1 x 2.3 cm right adrenal nodule with low attenuation most suggestive of benign adrenal adenoma. 5.2 x 2.6 cm left adrenal also mass new since 07/28/2017. This has attenuation too high to allow classification as an adenoma on this noncontrast CT. Follow-up CT could be used to ensure stability or MRI of the abdomen without and with contrast could be used to further evaluate for lipid poor adenoma. 3. Patchy areas of ground-glass attenuation in both lungs. Imaging features are nonspecific and may be related to an infectious or inflammatory  alveolitis. 4. Upper normal to borderline enlarged lymph nodes in the external iliac chains bilaterally, nonspecific but potentially reactive. 5. Hepatic steatosis. 6. Bilateral renal cysts. Electronically Signed   By: Misty Stanley M.D.   On: 10/04/2018 17:51   Mr Brain Wo Contrast  Result Date: 10/05/2018 CLINICAL DATA:  Altered level of consciousness. EXAM: MRI HEAD WITHOUT CONTRAST TECHNIQUE: Multiplanar, multiecho pulse sequences of the brain and surrounding structures were obtained without intravenous contrast. COMPARISON:  CT head 10/04/2018 FINDINGS: Brain: Ventricle size and cerebral volume normal.  Negative for acute infarct, hemorrhage, mass. Scattered small white matter hyperintensities bilaterally likely due to microvascular ischemia. Vascular: Normal arterial flow voids Skull and upper cervical spine: Negative Sinuses/Orbits: Mild mucosal edema paranasal sinuses. Left mastoid effusion. Normal orbit. Other: None IMPRESSION: No acute abnormality. Mild chronic white matter changes likely due to microvascular ischemia. Electronically Signed   By: Franchot Gallo M.D.   On: 10/05/2018 14:36     Medications:   . sodium chloride 5 mL/hr at 09/29/18 0242  . sodium chloride 0 mL/hr at 09/30/18 1534  . anticoagulant sodium citrate    . DAPTOmycin (CUBICIN)  IV Stopped (10/05/18 2124)  . lactated ringers Stopped (10/05/18 2047)  . levofloxacin (LEVAQUIN) IV     . aspirin EC  81 mg Oral Daily  . Chlorhexidine Gluconate Cloth  6 each Topical Q0600  . feeding supplement (NEPRO CARB STEADY)  237 mL Oral BID BM  . fludrocortisone  0.1 mg Oral Daily  . fluticasone furoate-vilanterol  1 puff Inhalation Daily  . folic acid  1 mg Oral Daily  . hydrocortisone  10 mg Oral BID  . insulin aspart  0-5 Units Subcutaneous QHS  . insulin aspart  0-9 Units Subcutaneous TID WC  . mouth rinse  15 mL Mouth Rinse BID  . multivitamin with minerals  1 tablet Oral Daily  . omega-3 acid ethyl esters  1 g Oral Daily  . sodium chloride flush  3 mL Intravenous Q12H  . warfarin  6 mg Oral ONCE-1800  . Warfarin - Pharmacist Dosing Inpatient   Does not apply q1800   sodium chloride, sodium chloride, acetaminophen **OR** acetaminophen, alum & mag hydroxide-simeth, anticoagulant sodium citrate, HYDROcodone-acetaminophen, iohexol, ipratropium-albuterol, menthol-cetylpyridinium **OR** phenol, ondansetron **OR** ondansetron (ZOFRAN) IV, sodium chloride flush  Assessment/ Plan:  [name] is a 58 y.o. black male with hyeprtension, COPD, diabetes mellitus type II who underwent bilateral knee replacement surgeries  last week. who was admitted to [facility] on 09/12/2018 for sepsis  1. Acute renal failure with metabolic acidosis  baseline creatinine of 1.07 with normal GFR in 06/12/18.  Likely severe ATN from hypotension and sepsis No indication for dialysis today. Polyuria could be post ATN diuresis  2. Septic shock: bilateral knee replacement on Sep 01, 2018 at [facility] septic arthritis, patient underwent I&D and polyethylene exchange on 5/31 and Rt knee on 6/1 - ID recommends 4 weeks of ceftazidime and daptomycin (6mg /kg) until 10/31/2018  3. Hypotension and Hypokalemia: with concern of adrenal insufficiency.  - Will need TTKG as an outpatient - discontinued atenolol - Start fludrocortisone and hydrocortisone - MRI can be done with no contrast (no gadolinium)  4. Anemia with renal failure: hemoglobin 7.8 - Order EPO subcu 10000 units x 1 6/18   LOS: 24 Avery Eustice 6/19/20201:01 PM

## 2018-10-06 NOTE — Progress Notes (Signed)
Inpatient Rehabilitation-Admissions Coordinator   [facility] continues to follow progress. Noted pt's orthostatic vitals continue to be limiting factor for full participation and progression with therapies. I will follow for tolerance; at this time, I doubt he would be able to tolerate 3 hours of activity due to current medical condition.   Jhonnie Garner, OTR/L  Rehab Admissions Coordinator  [phone] 10/06/2018 5:28 PM

## 2018-10-06 NOTE — Progress Notes (Signed)
Pharmacy Antibiotic Note  Isaac Cross is a 58 y.o. male admitted on 09/12/2018 with sepsis.  Pharmacy has been consulted for Daptomycin dosing.  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  He underwent I&D of both knees.  Cultures remain unrevealing but had received antibiotics prior to surgery.  He developed AKI and was requiring HD but Nephrology is hopeful that his kidney function is returned and may not need further HD (UOP improved).    Day #25 antibiotics/Day #9 daptomyicn/Day #3 Levaquin  (also s/p 7 days of eraxis) Per ID-plan abx thru 10/31/2018 (4 weeks) per neph note - Scr  8.38 > 7.51 > 6.63 > 5.17 - CK 6/18 = 92 Temp 101.3- having fevers- Ceftazidime changed to Levaquin   Plan:  Levaquin 500mg  IV q48h.  Renal fxn improved- Crcl 24.6 ml/min. Will adjust Levaquin to 750 mg IV q48h   Continue Daptomycin 6mg /kg (per Adj BW for BMI >40) 700mg  IV q48h  Check CK level in 3  days since had recent CK elevation.    His CK was WNL at 92 and was stable today  Need to monitor renal function closely as his kidneys function appears to be returning.  Off HD currently      Height: 6\' 4"  (193 cm) Weight: (!) 343 lb 14.7 oz (156 kg) IBW/kg (Calculated) : 86.8  Temp (24hrs), Avg:99.4 F (37.4 C), Min:98.6 F (37 C), Max:100.1 F (37.8 C)  Recent Labs  Lab 10/01/18 0427  10/02/18 0506 10/03/18 0302 10/04/18 0542 10/05/18 0506 10/06/18 0611  WBC 10.9*  --  10.3 10.4 10.2  10.4 8.3  --   CREATININE  --    < > 8.63* 8.38* 7.51* 6.63* 5.17*   < > = values in this interval not displayed.    Estimated Creatinine Clearance: 24.6 mL/min (A) (by C-G formula based on SCr of 5.17 mg/dL (H)).    Allergies  Allergen Reactions  . 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 Lipitor [Atorvastatin] Hives and Itching  . Other   . Viagra  [Sildenafil Citrate]     vision loss     Thank you for allowing pharmacy to be a part of this patient's  care.  Chinita Greenland PharmD Clinical Pharmacist 10/06/2018

## 2018-10-07 LAB — BASIC METABOLIC PANEL
Anion gap: 14 (ref 5–15)
BUN: 28 mg/dL — ABNORMAL HIGH (ref 6–20)
CO2: 19 mmol/L — ABNORMAL LOW (ref 22–32)
Calcium: 8.4 mg/dL — ABNORMAL LOW (ref 8.9–10.3)
Chloride: 104 mmol/L (ref 98–111)
Creatinine, Ser: 3.86 mg/dL — ABNORMAL HIGH (ref 0.61–1.24)
GFR calc Af Amer: 18 mL/min — ABNORMAL LOW (ref >60)
GFR calc non Af Amer: 16 mL/min — ABNORMAL LOW (ref >60)
Glucose, Bld: 153 mg/dL — ABNORMAL HIGH (ref 70–99)
Potassium: 3.4 mmol/L — ABNORMAL LOW (ref 3.5–5.1)
Sodium: 137 mmol/L (ref 135–145)

## 2018-10-07 LAB — CULTURE, BLOOD (ROUTINE X 2)
Culture: NO GROWTH
Culture: NO GROWTH
Special Requests: ADEQUATE
Special Requests: ADEQUATE

## 2018-10-07 LAB — GLUCOSE, CAPILLARY
Glucose-Capillary: 135 mg/dL — ABNORMAL HIGH (ref 70–99)
Glucose-Capillary: 180 mg/dL — ABNORMAL HIGH (ref 70–99)
Glucose-Capillary: 130 mg/dL — ABNORMAL HIGH (ref 70–99)
Glucose-Capillary: 114 mg/dL — ABNORMAL HIGH (ref 70–99)

## 2018-10-07 LAB — PROTIME-INR
INR: 2.2 — ABNORMAL HIGH (ref 0.8–1.2)
Prothrombin Time: 24.4 seconds — ABNORMAL HIGH (ref 11.4–15.2)

## 2018-10-07 MED ORDER — WARFARIN SODIUM 6 MG PO TABS
6.0000 mg | ORAL_TABLET | Freq: Every day | ORAL | Status: AC
  Administered 2018-10-07: 6 mg via ORAL
  Filled 2018-10-07: qty 1

## 2018-10-07 MED ORDER — SODIUM BICARBONATE 650 MG PO TABS
650.0000 mg | ORAL_TABLET | Freq: Two times a day (BID) | ORAL | Status: DC
  Administered 2018-10-07 – 2018-10-08 (×3): 650 mg via ORAL
  Filled 2018-10-07 (×5): qty 1

## 2018-10-07 MED ORDER — POTASSIUM CHLORIDE 20 MEQ PO PACK
40.0000 meq | PACK | Freq: Once | ORAL | Status: AC
  Administered 2018-10-07: 40 meq via ORAL
  Filled 2018-10-07: qty 2

## 2018-10-07 NOTE — Progress Notes (Signed)
Pharmacy Antibiotic Note  Isaac Cross is a 58 y.o. male admitted on 09/12/2018 with sepsis.  Pharmacy has been consulted for Daptomycin dosing.  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  He underwent I&D of both knees.  Cultures remain unrevealing but had received antibiotics prior to surgery.  He developed AKI and was requiring HD but Nephrology is hopeful that his kidney function is returned and may not need further HD (UOP improved).    Day #26 antibiotics/Day #10 daptomyicn/Day #4 Levaquin Per ID-plan abx thru 10/31/2018 (4 weeks) per neph note   Plan: Levaquin 750mg  IV q48h.   Continue Daptomycin 6mg /kg (per Adj BW for BMI >40) 700mg  IV q48h. Patient to get daptomycin today. If creatinine continues to improve, anticipate transitioning to daily daptomycin on 6/21.     Check CK level in 3  days since had recent CK elevation.   His CK was WNL at 92 and was stable today  Need to monitor renal function closely as his kidneys function appears to be returning.  Off HD currently      Height: 6\' 4"  (193 cm) Weight: (!) 343 lb 14.7 oz (156 kg) IBW/kg (Calculated) : 86.8  Temp (24hrs), Avg:98.9 F (37.2 C), Min:98.8 F (37.1 C), Max:98.9 F (37.2 C)  Recent Labs  Lab 10/01/18 0427  10/02/18 0506 10/03/18 0302 10/04/18 0542 10/05/18 0506 10/06/18 0611 10/07/18 0428  WBC 10.9*  --  10.3 10.4 10.2  10.4 8.3  --   --   CREATININE  --    < > 8.63* 8.38* 7.51* 6.63* 5.17* 3.86*   < > = values in this interval not displayed.    Estimated Creatinine Clearance: 33 mL/min (A) (by C-G formula based on SCr of 3.86 mg/dL (H)).    Allergies  Allergen Reactions  . 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 Lipitor [Atorvastatin] Hives and Itching  . Other   . Viagra  [Sildenafil Citrate]     vision loss     Thank you for allowing pharmacy to be a part of this patient's care.  Currie Paris, RPh  10/07/2018

## 2018-10-07 NOTE — Progress Notes (Signed)
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  L DVT Argatroban drip discontinued 10/03/2018  Allergies  Allergen Reactions  . 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 Lipitor [Atorvastatin] Hives and Itching  . Other   . Viagra  [Sildenafil Citrate]     vision loss    Patient Measurements: Height: 6\' 4"  (193 cm) Weight: (!) 343 lb 14.7 oz (156 kg) IBW/kg (Calculated) : 86.8  Vital Signs: Temp: 98.8 F (37.1 C) (06/20 0747) Temp Source: Oral (06/20 0747) BP: 141/86 (06/20 0747) Pulse Rate: 93 (06/20 0747)  Labs: Recent Labs    10/05/18 0506 10/06/18 0611 10/07/18 0428  HGB 7.8*  --   --   HCT 24.3*  --   --   PLT 354  --   --   LABPROT 24.9* 23.4* 24.4*  INR 2.3* 2.1* 2.2*  CREATININE 6.63* 5.17* 3.86*  CKTOTAL 92  --   --     Estimated Creatinine Clearance: 33 mL/min (A) (by C-G formula based on SCr of 3.86 mg/dL (H)).   Medical History: Past Medical History:  Diagnosis Date  . Allergy   . Anxiety   . Arthritis    knees  . Asthma, mild intermittent, well-controlled   . Chronic obstructive asthma ([place])   . COPD (chronic obstructive pulmonary disease) ([place])   . Depression with anxiety   . Diabetes ([place])   . GERD (gastroesophageal reflux disease)   . HBP (high blood pressure)   . High cholesterol   . Renal cyst, right   . Sleep apnea    CPAP  . Swelling     Medications:  Medications Prior to Admission  Medication Sig Dispense Refill Last Dose  . acetaminophen (TYLENOL) 500 MG tablet Take 1 tablet (500 mg total) by mouth every 6 (six) hours as needed. 90 tablet 0 09/12/2018 at 0800  . albuterol (PROVENTIL HFA;VENTOLIN HFA) 108 (90 Base) MCG/ACT inhaler INHALE 2 PUFFS INTO THE LUNGS 4 (FOUR) TIMES DAILY. 6.7 Inhaler 0 09/12/2018 at 0800  . aspirin 81 MG tablet Take 81 mg by mouth daily.   09/12/2018 at 0800  . atenolol (TENORMIN) 25 MG tablet Take 1 tablet (25 mg  total) by mouth every evening. 90 tablet 1 09/11/2018 at 2000  . cephALEXin (KEFLEX) 500 MG capsule Take 500 mg by mouth 3 (three) times daily.   09/12/2018 at 0800  . citalopram (CELEXA) 20 MG tablet Take 1 tablet (20 mg total) by mouth daily. 90 tablet 1 09/12/2018 at 0800  . fluticasone furoate-vilanterol (BREO ELLIPTA) 100-25 MCG/INH AEPB Inhale 1 puff into the lungs daily. 180 each 1 09/12/2018 at 0800  . Multiple Vitamins-Minerals (MENS MULTIVITAMIN PLUS) TABS Take by mouth.   09/12/2018 at 0800  . Omega-3 Fatty Acids (FISH OIL) 1000 MG CAPS Take by mouth daily.   09/12/2018 at 0800  . oxyCODONE-acetaminophen (PERCOCET/ROXICET) 5-325 MG tablet Take 1 tablet by mouth every 4 (four) hours.   09/12/2018 at 0800  . rosuvastatin (CRESTOR) 20 MG tablet TAKE 1 TABLET BY MOUTH EVERY DAY 90 tablet 1 09/12/2018 at 0800  . SENNA-PLUS 8.6-50 MG tablet Take 2 tablets by mouth every morning.   09/12/2018 at 0800  . sulfamethoxazole-trimethoprim (BACTRIM DS) 800-160 MG tablet Take 1 tablet by mouth 2 (two) times a day.   09/12/2018 at 0800  . XARELTO 10 MG TABS tablet Take 10 mg by mouth daily.    09/12/2018 at 0800  .  cetirizine (ZYRTEC) 10 MG tablet Take 10 mg by mouth daily.   prn at prn  . diclofenac sodium (VOLTAREN) 1 % GEL APPLY 4 GRAMS TOPICALLY FOUR TIMES A DAY 100 g 2 prn at prn  . lidocaine (XYLOCAINE) 2 % solution Use as directed 15 mLs in the mouth or throat as needed for mouth pain. 100 mL 0 prn at prn  . triamcinolone cream (KENALOG) 0.1 % triamcinolone acetonide 0.1 % topical cream   prn at prn    Assessment: 58 yr male had B/l knee replacements Sep 01, 2018 was admitted with fever, AKI/ Platelet count has been declining since 6/5. He was started on heparin 6/3. Child Pugh score estimated to be 5-6  HIT lab resulted 6/8 1740 - Abnormal 1.854, ordered SRA  Goal of Therapy:  aPTT 50-90 seconds Monitor platelets by anticoagulation protocol: Yes INR: 2-3 (however - may elevate higher than this  while on Warfarin and Argatroban - it will come back down when argatroban stopped)  Plan:  Continue warfarin 6mg  Q1800. Will obtain INR daily.   Patient on Levaquin and Daptomycin  Pharmacy will continue to monitor and adjust per consult.   Annemarie Sebree L, RPh 10/07/2018 12:23 PM

## 2018-10-07 NOTE — Progress Notes (Signed)
Occupational Therapy Treatment Patient Details Name: Isaac Cross MRN: [id number] DOB: 05/03/1960 Today's Date: 10/07/2018    History of present illness Pt is a 58 y.o. male presenting to hospital 09/12/18 after rolling OOB landing on L knee; pt with increased dizziness x2 days, fevers x3 days, and generalized weakness.  S/p B TKR 09/01/18 in [place] (pt reports discharging home same day).  Pt admitted with sepsis, acute renal failure, and B TKR.  Pt s/p L knee 5/31 and R knee 6/1 I&D with poly exchange.  Pt noted with severe encephalopathy from DT's with extensive ETOH abuse.  L LE DVT noted.  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  HD started 6/5 with L IJ temporary HD catheter.  PMH includes B TKR 09/01/18 in [place], DM, asthma, R vein surgery, COPD. Right IJ line removed 6/16. 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   OT comments  Pt progressing toward stated goals, eager to work with OT this date. Vitals taken to monitor orthostatics. 121/71 lying, 102/66 EOB, 98/70 EOB for 10 mins, and 87/68 when up to chair. Pt endorses dizziness EOB, but states it did not worsen with t/f. Pt sat EOB for >10 minutes to engage in shaving face. Once fatigue, pt resting elbows on knees but stable balance wise. Dr. Jerelyn Charles in room requesting further mobility progression. Pt to chair with min guard +2 for safety, overall no physical assistance needed. Pt denied physical assist/intervention due to lack of awareness of deficits so close safety maintained. Pt up in chair to eat breakfast. Continue to recommend CIR for intensive therapy to return pt to PLOF. Will follow per POC listed below.   Follow Up Recommendations  CIR    Equipment Recommendations  Other (comment)(defer to next venue)    Recommendations for Other Services      Precautions / Restrictions Precautions Precautions: Fall Precaution Comments: orthostatic, slow to respond at times Restrictions Weight Bearing  Restrictions: No RLE Weight Bearing: Weight bearing as tolerated LLE Weight Bearing: Weight bearing as tolerated       Mobility Bed Mobility Overal bed mobility: Needs Assistance Bed Mobility: Supine to Sit Rolling: Min guard   Supine to sit: Min guard;HOB elevated     General bed mobility comments: increased time and use of rails  Transfers Overall transfer level: Needs assistance Equipment used: Rolling walker (2 wheeled) Transfers: Sit to/from Stand Sit to Stand: Min guard;+2 physical assistance         General transfer comment: min guard +2 for safety due to pt decreased awareness, but overall no more than close min guard for safety needed to chair    Balance Overall balance assessment: Needs assistance Sitting-balance support: Feet supported Sitting balance-Leahy Scale: Good Sitting balance - Comments: able to static sit and engage in shavnig for ~10 mins. Leans on elbows when fatigued   Standing balance support: Bilateral upper extremity supported;During functional activity Standing balance-Leahy Scale: Fair Standing balance comment: reliant opn BUE external support                           ADL either performed or assessed with clinical judgement   ADL       Grooming: Set up;Sitting Grooming Details (indicate cue type and reason): to shave face sitting EOB; OT/PT held phone camera to use as Press photographer Transfer: Min guard;+2 for  safety/equipment;Grab bars;RW Armed forces technical officer Details (indicate cue type and reason): simulated with recliner           General ADL Comments: focused session on mobility progression and tolerance given orthostatic vitals. Pt able to tolerate EOB sitting for >10 minutes and pivot to chair     Vision Patient Visual Report: No change from baseline     Perception     Praxis      Cognition Arousal/Alertness: Awake/alert Behavior During Therapy: Flat affect;WFL for tasks  assessed/performed Overall Cognitive Status: Impaired/Different from baseline Area of Impairment: Following commands;Safety/judgement;Memory;Problem solving                     Memory: Decreased short-term memory Following Commands: Follows multi-step commands with increased time;Follows one step commands with increased time Safety/Judgement: Decreased awareness of safety;Decreased awareness of deficits   Problem Solving: Slow processing;Decreased initiation;Requires verbal cues;Difficulty sequencing General Comments: pt appropriately conversive with OT this date, slow to respond and process without session. Denies assistance from OT during t/f showing decreased awareness.        Exercises Other Exercises Other Exercises: sitting EOB x 25 minutes for monitoring BP and OT ADL tasks - shaving   Shoulder Instructions       General Comments      Pertinent Vitals/ Pain       Pain Assessment: Faces Faces Pain Scale: Hurts little more Pain Location: B knees Pain Descriptors / Indicators: Sore;Discomfort;Grimacing Pain Intervention(s): Limited activity within patient's tolerance;Monitored during session;Repositioned  Home Living                                          Prior Functioning/Environment              Frequency  Min 3X/week        Progress Toward Goals  OT Goals(current goals can now be found in the care plan section)  Progress towards OT goals: Progressing toward goals  Acute Rehab OT Goals Patient Stated Goal: to go home OT Goal Formulation: With patient Time For Goal Achievement: 10/09/18 Potential to Achieve Goals: [place] Discharge plan remains appropriate;Frequency remains appropriate    Co-evaluation    PT/OT/SLP Co-Evaluation/Treatment: Yes Reason for Co-Treatment: Complexity of the patient's impairments (multi-system involvement);To address functional/ADL transfers PT goals addressed during session: Mobility/safety  with mobility OT goals addressed during session: ADL's and self-care      AM-PAC OT "6 Clicks" Daily Activity     Outcome Measure   Help from another person eating meals?: None Help from another person taking care of personal grooming?: None Help from another person toileting, which includes using toliet, bedpan, or urinal?: A Little Help from another person bathing (including washing, rinsing, drying)?: A Lot Help from another person to put on and taking off regular upper body clothing?: A Little Help from another person to put on and taking off regular lower body clothing?: A Lot 6 Click Score: 18    End of Session Equipment Utilized During Treatment: Gait belt;Rolling walker  OT Visit Diagnosis: Other abnormalities of gait and mobility (R26.89);History of falling (Z91.81);Pain Pain - Right/Left: (Bil) Pain - part of body: Knee   Activity Tolerance Patient tolerated treatment well   Patient Left in chair;with chair alarm set;with call bell/phone within reach   Nurse Communication Other (comment)(ok to shave')        Time: 0930-1005  OT Time Calculation (min): 35 min  Charges: OT General Charges $OT Visit: 1 Visit OT Treatments $Self Care/Home Management : 8-22 mins  Zenovia Jarred, MSOT, OTR/L [place] OT/ Acute Relief OT [facility] [phone]  Zenovia Jarred 10/07/2018, 10:57 AM

## 2018-10-07 NOTE — Progress Notes (Signed)
[facility]  ROUNDING NOTE   Subjective:   afebrile  UOP 3900  No complaints  Objective:  Vital signs in last 24 hours:  Temp:  [98.8 F (37.1 C)-98.9 F (37.2 C)] 98.8 F (37.1 C) (06/20 0747) Pulse Rate:  [93-96] 93 (06/20 0747) Resp:  [16-18] 18 (06/19 2249) BP: (134-142)/(85-86) 141/86 (06/20 0747) SpO2:  [98 %-100 %] 98 % (06/20 0747)  Weight change:  Filed Weights   09/29/18 0630 09/29/18 1445 09/29/18 1823  Weight: (!) 157.1 kg (!) 157 kg (!) 156 kg    Intake/Output: I/O last 3 completed shifts: In: 784.6 [I.V.:667.5; IV Piggyback:117.1] Out: 5400 [Urine:5400]   Intake/Output this shift:  Total I/O In: 369.1 [P.O.:240; IV Piggyback:129.1] Out: 800 [Urine:800]  Physical Exam: General: NAD  Head: Moist oral mucosal membranes  Eyes: Anicteric   Lungs:  clear  Heart: regular  Abdomen:  Soft, nontender, obese   Extremities: + peripheral edema  Neurologic: Awake, alert, conversant  Skin: warm        Basic Metabolic Panel: Recent Labs  Lab 10/01/18 0745 10/02/18 0506 10/03/18 0302 10/04/18 0542 10/05/18 0506 10/06/18 0611 10/07/18 0428  NA 134* 137 138 138 137 135 137  K 3.4* 3.4* 3.7 3.4* 3.1* 3.6 3.4*  CL 98 99 101 102 103 101 104  CO2 25 24 24 22  21* 25 19*  GLUCOSE 94 106* 101* 94 89 137* 153*  BUN 37* 36* 37* 37* 35* 31* 28*  CREATININE 8.74* 8.63* 8.38* 7.51* 6.63* 5.17* 3.86*  CALCIUM 7.9* 8.3* 8.5* 8.6* 8.3* 8.2* 8.4*  MG 1.7 1.9  --   --   --   --   --     Liver Function Tests: No results for input(s): AST, ALT, ALKPHOS, BILITOT, PROT, ALBUMIN in the last 168 hours. No results for input(s): LIPASE, AMYLASE in the last 168 hours. No results for input(s): AMMONIA in the last 168 hours.  CBC: Recent Labs  Lab 10/01/18 0427 10/02/18 0506 10/03/18 0302 10/04/18 0542 10/05/18 0506  WBC 10.9* 10.3 10.4 10.2  10.4 8.3  NEUTROABS  --   --   --  5.7 4.3  HGB 8.5* 8.7* 8.8* 8.2*  8.2* 7.8*  HCT 27.0* 27.1* 27.8*  26.6*  25.9* 24.3*  MCV 84.1 81.6 82.0 85.0  82.5 82.1  PLT 499* 503* 494* 472*  450* 354    Cardiac Enzymes: Recent Labs  Lab 10/02/18 0506 10/05/18 0506  CKTOTAL 114 92    BNP: Invalid input(s): POCBNP  CBG: Recent Labs  Lab 10/06/18 1227 10/06/18 1717 10/06/18 2103 10/07/18 0747 10/07/18 1159  GLUCAP 117* 107* 139* 135* 180*    Microbiology: Results for orders placed or performed during the hospital encounter of 09/12/18  Blood Culture (routine x 2)     Status: None   Collection Time: 09/12/18  9:56 AM   Specimen: BLOOD LEFT HAND  Result Value Ref Range Status   Specimen Description BLOOD LEFT HAND  Final   Special Requests   Final    BOTTLES DRAWN AEROBIC AND ANAEROBIC Blood Culture adequate volume   Culture   Final    NO GROWTH 5 DAYS Performed at [facility], [address], [place], [place] [address]    Report Status 09/17/2018 FINAL  Final  Urine culture     Status: None   Collection Time: 09/12/18 10:01 AM   Specimen: Urine, Random  Result Value Ref Range Status   Specimen Description   Final  URINE, RANDOM Performed at [facility], [address] [place], [place] [address]    Special Requests   Final    NONE Performed at [facility], [address] [place], [place] [address]    Culture   Final    NO GROWTH Performed at [facility], [place] [address], [place], [place] [address]    Report Status 09/13/2018 FINAL  Final  SARS Coronavirus 2 (CEPHEID- Performed in [place] hospital lab), Hosp Order     Status: None   Collection Time: 09/12/18 10:02 AM   Specimen: Nasopharyngeal Swab  Result Value Ref Range Status   SARS Coronavirus 2 NEGATIVE NEGATIVE Final    Comment: (NOTE) If result is NEGATIVE SARS-CoV-2 target nucleic acids are NOT DETECTED. The SARS-CoV-2 RNA is generally detectable in upper and lower  respiratory specimens during the acute phase of infection. The lowest  concentration of  SARS-CoV-2 viral copies this assay can detect is 250  copies / mL. A negative result does not preclude SARS-CoV-2 infection  and should not be used as the sole basis for treatment or other  patient management decisions.  A negative result may occur with  improper specimen collection / handling, submission of specimen other  than nasopharyngeal swab, presence of viral mutation(s) within the  areas targeted by this assay, and inadequate number of viral copies  (<250 copies / mL). A negative result must be combined with clinical  observations, patient history, and epidemiological information. If result is POSITIVE SARS-CoV-2 target nucleic acids are DETECTED. The SARS-CoV-2 RNA is generally detectable in upper and lower  respiratory specimens dur ing the acute phase of infection.  Positive  results are indicative of active infection with SARS-CoV-2.  Clinical  correlation with patient history and other diagnostic information is  necessary to determine patient infection status.  Positive results do  not rule out bacterial infection or co-infection with other viruses. If result is PRESUMPTIVE POSTIVE SARS-CoV-2 nucleic acids MAY BE PRESENT.   A presumptive positive result was obtained on the submitted specimen  and confirmed on repeat testing.  While 2019 novel coronavirus  (SARS-CoV-2) nucleic acids may be present in the submitted sample  additional confirmatory testing may be necessary for epidemiological  and / or clinical management purposes  to differentiate between  SARS-CoV-2 and other Sarbecovirus currently known to infect humans.  If clinically indicated additional testing with an alternate test  methodology [phone]) is advised. The SARS-CoV-2 RNA is generally  detectable in upper and lower respiratory sp ecimens during the acute  phase of infection. The expected result is Negative. Fact Sheet for Patients:  StrictlyIdeas.no Fact Sheet for Healthcare  Providers: BankingDealers.co.za This test is not yet approved or cleared by the [place] FDA and has been authorized for detection and/or diagnosis of SARS-CoV-2 by FDA under an Emergency Use Authorization (EUA).  This EUA will remain in effect (meaning this test can be used) for the duration of the COVID-19 declaration under Section 564(b)(1) of the Act, 21 U.S.C. section 360bbb-3(b)(1), unless the authorization is terminated or revoked sooner. Performed at [facility], [address] [place], [place] [address]   Blood Culture (routine x 2)     Status: None   Collection Time: 09/12/18 10:06 AM   Specimen: BLOOD  Result Value Ref Range Status   Specimen Description BLOOD LEFT ANTECUBITAL  Final   Special Requests   Final    BOTTLES DRAWN AEROBIC AND ANAEROBIC Blood Culture adequate volume  Culture   Final    NO GROWTH 5 DAYS Performed at [facility], [address] [place], [place] [address]    Report Status 09/17/2018 FINAL  Final  MRSA PCR Screening     Status: None   Collection Time: 09/12/18  7:04 PM   Specimen: Nasal Mucosa; Nasopharyngeal  Result Value Ref Range Status   MRSA by PCR NEGATIVE NEGATIVE Final    Comment:        The GeneXpert MRSA Assay (FDA approved for NASAL specimens only), is one component of a comprehensive MRSA colonization surveillance program. It is not intended to diagnose MRSA infection nor to guide or monitor treatment for MRSA infections. Performed at [facility], [address] [place], [place] [address]   C difficile quick scan w PCR reflex     Status: None   Collection Time: 09/14/18  2:21 PM   Specimen: STOOL  Result Value Ref Range Status   C Diff antigen NEGATIVE NEGATIVE Final   C Diff toxin NEGATIVE NEGATIVE Final   C Diff interpretation No C. difficile detected.  Final    Comment: Performed at [facility], [address] [place], [place] [address]  Group  A Strep by PCR     Status: None   Collection Time: 09/15/18  1:53 PM  Result Value Ref Range Status   Group A Strep by PCR NOT DETECTED NOT DETECTED Final    Comment: Performed at [facility], [address] [place], [place] [address]  Body fluid culture     Status: None   Collection Time: 09/15/18  5:13 PM   Specimen: KNEE  Result Value Ref Range Status   Specimen Description   Final    KNEE Performed at [facility], [address] [place], [place] [address]    Special Requests   Final    NONE Performed at [facility], [address] [place], [place] [address]    Gram Stain   Final    RARE RBCS FEW WBC SEEN NO ORGANISMS SEEN Performed at [facility], [address] [place], [place] [address]    Culture   Final    NO GROWTH 3 DAYS Performed at [facility], [place] [address] [place], [place] [address]    Report Status 09/19/2018 FINAL  Final  Body fluid culture     Status: None   Collection Time: 09/16/18  8:55 AM   Specimen: KNEE  Result Value Ref Range Status   Specimen Description KNEE RIGHT  Final   Special Requests NONE  Final   Gram Stain   Final    RARE WBC PRESENT, PREDOMINANTLY PMN NO ORGANISMS SEEN    Culture   Final    NO GROWTH 3 DAYS Performed at [facility], [place] [address], [place], [place] [address]    Report Status 09/20/2018 FINAL  Final  Aerobic/Anaerobic Culture (surgical/deep wound)     Status: None   Collection Time: 09/17/18 11:32 AM   Specimen: Wound  Result Value Ref Range Status   Specimen Description   Final    TISSUE LEFT KNEE 1 Performed at [facility], [address] [place], [place] [address]    Special Requests PATIENT ON FOLLOWING MAXIPIME VANCOMYCIN  Final   Gram Stain   Final    FEW WBC PRESENT,BOTH PMN AND MONONUCLEAR NO ORGANISMS SEEN    Culture   Final    No growth aerobically or anaerobically.  Performed at [facility], [place] [address]  [place], [place] [address]    Report Status 09/22/2018 FINAL  Final  Acid Fast Smear (AFB)     Status: None   Collection Time: 09/17/18 11:32 AM   Specimen: Synovium  Result Value Ref Range Status   AFB Specimen Processing Comment  Final    Comment: Tissue Grinding and Digestion/Decontamination   Acid Fast Smear Negative  Final    Comment: (NOTE) Performed At: [facility] [place], [place] [id number] Rush Farmer MD AL:[id number]    Source (AFB) TISSUE  Final    Comment: LEFT KNEE Performed at [facility], [place] [address], [place], [place] [address]   Culture, fungus without smear     Status: None (Preliminary result)   Collection Time: 09/17/18 11:34 AM   Specimen: Synovium; Other  Result Value Ref Range Status   Specimen Description   Final    SYNOVIAL Performed at [facility], [address], [place], [place] [address]    Special Requests   Final    NONE Performed at [facility], [address] [place], [place] [address]    Culture   Final    NO FUNGUS ISOLATED AFTER 14 DAYS Performed at [facility], [place] [address] [place], [place] [address]    Report Status PENDING  Incomplete  Acid Fast Culture with reflexed sensitivities     Status: None   Collection Time: 09/17/18 11:36 AM   Specimen: Synovium  Result Value Ref Range Status   Acid Fast Culture CANC  Final    Comment: (NOTE) Test cancelled at client's request.      Cancelled per Levin Erp 09/21/2018 Performed At: [facility] [place], [place] [id number] Rush Farmer MD RD:[id number]    Source of Sample TISSUE  Final    Comment: RIGHT KNEE Performed at [facility], [place] [address], [place], [place] [address]   Aerobic/Anaerobic Culture (surgical/deep wound)     Status: None   Collection Time: 09/17/18 11:43 AM   Specimen: Wound  Result Value Ref Range Status   Specimen Description   Final    TISSUE LEFT KNEE  2 Performed at [facility], [address] [place], [place] [address]    Special Requests PATIENT ON FOLLOWING MAXIPIME VANCOMYCIN  Final   Gram Stain   Final    FEW WBC PRESENT, PREDOMINANTLY PMN NO ORGANISMS SEEN    Culture   Final    No growth aerobically or anaerobically. Performed at [facility], [place] [address] [place], [place] [address]    Report Status 09/22/2018 FINAL  Final  Aerobic/Anaerobic Culture (surgical/deep wound)     Status: None   Collection Time: 09/17/18 11:44 AM   Specimen: Wound  Result Value Ref Range Status   Specimen Description   Final    TISSUE LEFT KNEE 3 Performed at [facility], [address] [place], [place] [address]    Special Requests PATIENT ON FOLLOWING MAXIPIME VANCOMYCIN  Final   Gram Stain   Final    MODERATE WBC PRESENT, PREDOMINANTLY PMN NO ORGANISMS SEEN    Culture   Final    No growth aerobically or anaerobically. Performed at [facility], [place] [address] [place], [place] [address]    Report Status 09/22/2018 FINAL  Final  Aerobic/Anaerobic Culture (surgical/deep wound)     Status: None   Collection Time: 09/18/18  9:43 PM   Specimen: [facility] Other; Tissue  Result Value Ref Range Status   Specimen Description   Final    SYNOVIAL Performed at [facility], [address] [place], [place] [address]    Special Requests   Final    NONE Performed at [facility], [address] [place], [place] [address]    Gram Stain   Final    RARE WBC PRESENT, PREDOMINANTLY MONONUCLEAR NO ORGANISMS SEEN    Culture   Final    No growth aerobically or anaerobically. Performed at [facility], [place] [address], [place], [place] [address]    Report Status 09/24/2018 FINAL  Final  Aerobic/Anaerobic Culture (surgical/deep wound)     Status: None   Collection Time: 09/18/18  9:44 PM   Specimen: [facility] Other; Tissue  Result Value Ref Range Status   Specimen Description   Final     SYNOVIAL Performed at [facility], [address], [place], [place] [address]    Special Requests   Final    RIGHT KNEE Performed at [facility], [place], [place] [address]    Gram Stain   Final    FEW WBC PRESENT,BOTH PMN AND MONONUCLEAR NO ORGANISMS SEEN    Culture   Final    No growth aerobically or anaerobically. Performed at [facility], [place] [address] [place], [place] [address]    Report Status 09/24/2018 FINAL  Final  Aerobic/Anaerobic Culture (surgical/deep wound)     Status: None   Collection Time: 09/18/18  9:45 PM   Specimen: [facility] Other; Tissue  Result Value Ref Range Status   Specimen Description   Final    SYNOVIAL Performed at [facility], [address] [place], [place] [address]    Special Requests   Final    RIGHT KNEE Performed at [facility], [address] [place], [place] [address]    Gram Stain   Final    FEW WBC PRESENT, PREDOMINANTLY MONONUCLEAR NO ORGANISMS SEEN    Culture   Final    No growth aerobically or anaerobically. Performed at [facility], [place] [address], [place], [place] [address]    Report Status 09/24/2018 FINAL  Final  Culture, blood (Routine X 2) w Reflex to ID Panel     Status: None   Collection Time: 09/19/18  1:11 PM   Specimen: BLOOD  Result Value Ref Range Status   Specimen Description BLOOD LEFT ANTECUBITAL  Final   Special Requests   Final    BOTTLES DRAWN AEROBIC AND ANAEROBIC Blood Culture results may not be optimal due to an excessive volume of blood received in culture bottles   Culture   Final    NO GROWTH 5 DAYS Performed at [facility], [address] [place], [place] [address]    Report Status 09/24/2018 FINAL  Final  Culture, blood (Routine X 2) w Reflex to ID Panel     Status: None   Collection Time: 09/19/18  1:20 PM   Specimen: BLOOD  Result Value Ref Range Status   Specimen Description BLOOD BLOOD RIGHT HAND  Final    Special Requests   Final    BOTTLES DRAWN AEROBIC AND ANAEROBIC Blood Culture results may not be optimal due to an excessive volume of blood received in culture bottles   Culture   Final    NO GROWTH 5 DAYS Performed at [facility], [address] [place], [place] [address]    Report Status 09/24/2018 FINAL  Final  Urine Culture     Status: None   Collection Time: 09/20/18  3:59 PM   Specimen: Urine, Random  Result Value Ref Range Status   Specimen Description   Final    URINE, RANDOM Performed at [facility], [address], [place], [place] [address]    Special Requests   Final    NONE Performed at [facility], [address], [place], [place] [address]    Culture   Final    NO GROWTH Performed at [facility], [place] [address] [place], [place] [address]    Report Status 09/21/2018 FINAL  Final  C difficile quick scan w PCR reflex     Status: None   Collection Time: 09/21/18  3:27 PM   Specimen: STOOL  Result Value Ref Range Status   C Diff antigen NEGATIVE NEGATIVE Final   C Diff toxin NEGATIVE NEGATIVE Final   C Diff interpretation No C. difficile detected.  Final    Comment: Performed at [facility], [address] [place], [place] [address]  Gastrointestinal Panel by PCR , Stool     Status: None   Collection Time: 09/21/18  3:27 PM   Specimen: Stool  Result Value Ref Range Status   Campylobacter species NOT DETECTED NOT DETECTED Final   Plesimonas shigelloides NOT DETECTED NOT DETECTED Final   Salmonella species NOT DETECTED NOT DETECTED Final   Yersinia enterocolitica NOT DETECTED NOT DETECTED Final   Vibrio species NOT DETECTED NOT DETECTED Final   Vibrio cholerae NOT DETECTED NOT DETECTED Final   Enteroaggregative E coli (EAEC) NOT DETECTED NOT DETECTED Final   Enteropathogenic E coli (EPEC) NOT DETECTED NOT DETECTED Final   Enterotoxigenic E coli (ETEC) NOT DETECTED NOT DETECTED Final   Shiga like toxin  producing E coli (STEC) NOT DETECTED NOT DETECTED Final   Shigella/Enteroinvasive E coli (EIEC) NOT DETECTED NOT DETECTED Final   Cryptosporidium NOT DETECTED NOT DETECTED Final   Cyclospora cayetanensis NOT DETECTED NOT DETECTED Final   Entamoeba histolytica NOT DETECTED NOT DETECTED Final   Giardia lamblia NOT DETECTED NOT DETECTED Final   Adenovirus F40/41 NOT DETECTED NOT DETECTED Final   Astrovirus NOT DETECTED NOT DETECTED Final   Norovirus GI/GII NOT DETECTED NOT DETECTED Final   Rotavirus A NOT DETECTED NOT DETECTED Final   Sapovirus (I, II, IV, and V) NOT DETECTED NOT DETECTED Final    Comment: Performed at [facility], [address] [place], [place] [address]  Novel Coronavirus, NAA (hospital order; send-out to ref lab)     Status: None   Collection Time: 09/29/18  4:36 PM   Specimen: Nasopharyngeal Swab; Respiratory  Result Value Ref Range Status   SARS-CoV-2, NAA NOT DETECTED NOT DETECTED Final    Comment: (NOTE) This test was developed and its performance characteristics determined by [employer]. This test has not been FDA cleared or approved. This test has been authorized by FDA under an Emergency Use Authorization (EUA). This test is only authorized for the duration of time the declaration that circumstances exist justifying the authorization of the emergency use of in vitro diagnostic tests for detection of SARS-CoV-2 virus and/or diagnosis of COVID-19 infection under section 564(b)(1) of the Act, 21 U.S.C. 517GYF-7(C)(9), unless the authorization is terminated or revoked sooner. When diagnostic testing is negative, the possibility of a false negative result should be considered in the context of a patient's  recent exposures and the presence of clinical signs and symptoms consistent with COVID-19. An individual without symptoms of COVID-19 and who is not shedding SARS-CoV-2 virus would expect to have a negative (not detected) result in this  assay. Performed  At: [facility] [address] [place], [place] [id number] Rush Farmer MD OE:[id number]    [place]  Final    Comment: Performed at [facility], [address] [place], [place] [address]  CULTURE, BLOOD (ROUTINE X 2) w Reflex to ID Panel     Status: None   Collection Time: 10/02/18  5:26 PM   Specimen: BLOOD  Result Value Ref Range Status   Specimen Description BLOOD BLOOD RIGHT FOREARM  Final   Special Requests   Final    BOTTLES DRAWN AEROBIC AND ANAEROBIC Blood Culture adequate volume   Culture   Final    NO GROWTH 5 DAYS Performed at [facility], [address] [place], [place] [address]    Report Status 10/07/2018 FINAL  Final  CULTURE, BLOOD (ROUTINE X 2) w Reflex to ID Panel     Status: None   Collection Time: 10/02/18  5:41 PM   Specimen: BLOOD  Result Value Ref Range Status   Specimen Description BLOOD BLOOD RIGHT HAND  Final   Special Requests   Final    BOTTLES DRAWN AEROBIC AND ANAEROBIC Blood Culture adequate volume   Culture   Final    NO GROWTH 5 DAYS Performed at [facility], [address], [place], [place] [address]    Report Status 10/07/2018 FINAL  Final    Coagulation Studies: Recent Labs    10/05/18 0506 10/06/18 0611 10/07/18 0428  LABPROT 24.9* 23.4* 24.4*  INR 2.3* 2.1* 2.2*    Urinalysis: Recent Labs    10/05/18 1025  COLORURINE STRAW*  LABSPEC 1.006  PHURINE 7.0  GLUCOSEU NEGATIVE  HGBUR SMALL*  BILIRUBINUR NEGATIVE  KETONESUR NEGATIVE  PROTEINUR 30*  NITRITE NEGATIVE  LEUKOCYTESUR NEGATIVE      Imaging: Mr Abdomen Wo Contrast  Result Date: 10/06/2018 CLINICAL DATA:  Adrenal masses. EXAM: MRI ABDOMEN WITHOUT CONTRAST TECHNIQUE: Multiplanar multisequence MR imaging was performed without the administration of intravenous contrast. COMPARISON:  10/04/2018 and 04/30/2015 FINDINGS: Lower chest: No acute findings. Hepatobiliary: No masses  visualized on this unenhanced exam. Gallbladder is unremarkable. No evidence of biliary ductal dilatation. Pancreas: No mass or inflammatory process visualized on this unenhanced exam. Spleen:  Within normal limits in size. Adrenals/Urinary tract: 5.1 x 2.5 cm left adrenal mass is seen which shows T2 hyperintensity, and a peripheral rim of T1 hyperintensity and T2 hypointensity. This shows no evidence of signal dropout on chemical shift imaging, and has nonspecific characteristics. This is new compared to 2017 exam, and differential diagnosis includes nodular hyperplasia, subacute adrenal hematomas, lipid poor adenomas, and metastases. Masslike thickening in the right adrenal gland measures 3.8 x 2.1 cm. This lesion shows T1 and T2 hyperintensity and is also new since 2017 exam. No signal dropout is seen on chemical shift imaging. These are nonspecific characteristics. Fluid attenuation renal cysts are noted bilaterally. No evidence of renal mass or hydronephrosis. Stomach/Bowel: Visualized portion unremarkable. Vascular/Lymphatic: No pathologically enlarged lymph nodes identified. No evidence of abdominal aortic aneurysm. Other:  None. Musculoskeletal:  No suspicious bone lesions identified. IMPRESSION: Bilateral adrenal mass, both new since 2017 but with nonspecific imaging characteristics. Differential diagnosis includes nodular hyperplasia, subacute adrenal hematomas, lipid poor adenomas, and metastatic disease. In patient without history of cancer,  consider biochemical assays to determine functional status, as well as surgical resection. If patient has cancer history, consider biopsy or PET-CT. This recommendation follows ACR consensus guidelines: Management of Incidental Adrenal Masses: A White Paper of the ACR Incidental Findings Committee. J Am Coll Radiol 2017;14:1038-1044. Electronically Signed   By: Marlaine Hind M.D.   On: 10/06/2018 15:04     Medications:   . sodium chloride 5 mL/hr at 09/29/18  0242  . sodium chloride 0 mL/hr at 09/30/18 1534  . anticoagulant sodium citrate    . DAPTOmycin (CUBICIN)  IV Stopped (10/05/18 2124)  . levofloxacin (LEVAQUIN) IV Stopped (10/06/18 1929)   . aspirin EC  81 mg Oral Daily  . Chlorhexidine Gluconate Cloth  6 each Topical Q0600  . citalopram  10 mg Oral Daily  . feeding supplement (NEPRO CARB STEADY)  237 mL Oral BID BM  . fludrocortisone  0.1 mg Oral Daily  . fluticasone furoate-vilanterol  1 puff Inhalation Daily  . folic acid  1 mg Oral Daily  . hydrocortisone  20 mg Oral BID  . insulin aspart  0-5 Units Subcutaneous QHS  . insulin aspart  0-9 Units Subcutaneous TID WC  . mouth rinse  15 mL Mouth Rinse BID  . multivitamin with minerals  1 tablet Oral Daily  . omega-3 acid ethyl esters  1 g Oral Daily  . sodium bicarbonate  650 mg Oral BID  . sodium chloride flush  3 mL Intravenous Q12H  . warfarin  6 mg Oral q1800  . Warfarin - Pharmacist Dosing Inpatient   Does not apply q1800   sodium chloride, sodium chloride, acetaminophen **OR** acetaminophen, alum & mag hydroxide-simeth, anticoagulant sodium citrate, HYDROcodone-acetaminophen, iohexol, ipratropium-albuterol, menthol-cetylpyridinium **OR** phenol, ondansetron **OR** ondansetron (ZOFRAN) IV, sodium chloride flush  Assessment/ Plan:  Isaac Cross is a 58 y.o. black male with hyeprtension, COPD, diabetes mellitus type II who underwent bilateral knee replacement surgeries on 5/15 at [facility]. who was admitted to [facility] on 09/12/2018 for septic arthritis. Hospital course with left knee surgery on 5/31 and right knee surgery on 6/1 by Dr. Harlow Mares.   1. Acute renal failure with metabolic acidosis  baseline creatinine of 1.07 with normal GFR in 06/12/18.  Acute renal failure secondary to ATN. Required hemodialysis. First treatment was 6/9. Last hemodialysis treatment 6/12.  Continues to have improving renal function and nonoliguric urine output.  Dialysis access has been  removed. No indication for further dialysis.   2. Septic shock: with septic arthritis. Afebrile last 24 hours - ID recommends 4 weeks of ceftazidime and daptomycin (6mg /kg) until 10/31/2018  3. Hypotension and Hypokalemia: with concern of adrenal insufficiency.  141/86 MRI with bilateral masses - Will need TTKG as an outpatient - fludrocortisone and hydrocortisone - MRI with gadolinium when renal function back to baseline.   4. Anemia with renal failure: hemoglobin 7.8. EPO subcu 10000 units x 1 6/18   LOS: 25 Isaac Cross 6/20/20202:10 PM

## 2018-10-07 NOTE — Progress Notes (Signed)
Subjective:  Patient reports pain as mild.   Objective:   VITALS:   Vitals:   10/06/18 0814 10/06/18 1509 10/06/18 2249 10/07/18 0747  BP: 135/87 134/85 (!) 142/86 (!) 141/86  Pulse: 93 93 96 93  Resp: 17 16 18    Temp: 98.5 F (36.9 C) 98.9 F (37.2 C) 98.9 F (37.2 C) 98.8 F (37.1 C)  TempSrc: Oral Oral Oral Oral  SpO2: 100% 100% 99% 98%  Weight:      Height:        PHYSICAL EXAM:  Neurovascular intact Dorsiflexion/Plantar flexion intact Incision: no drainage and healing well No cellulitis present Compartment soft  LABS  Results for orders placed or performed during the hospital encounter of 09/12/18 (from the past 24 hour(s))  Glucose, capillary     Status: Abnormal   Collection Time: 10/06/18 12:27 PM  Result Value Ref Range   Glucose-Capillary 117 (H) 70 - 99 mg/dL   Comment 1 Notify RN   Glucose, capillary     Status: Abnormal   Collection Time: 10/06/18  5:17 PM  Result Value Ref Range   Glucose-Capillary 107 (H) 70 - 99 mg/dL   Comment 1 Notify RN   Glucose, capillary     Status: Abnormal   Collection Time: 10/06/18  9:03 PM  Result Value Ref Range   Glucose-Capillary 139 (H) 70 - 99 mg/dL  Protime-INR     Status: Abnormal   Collection Time: 10/07/18  4:28 AM  Result Value Ref Range   Prothrombin Time 24.4 (H) 11.4 - 15.2 seconds   INR 2.2 (H) 0.8 - 1.2  Basic metabolic panel     Status: Abnormal   Collection Time: 10/07/18  4:28 AM  Result Value Ref Range   Sodium 137 135 - 145 mmol/L   Potassium 3.4 (L) 3.5 - 5.1 mmol/L   Chloride 104 98 - 111 mmol/L   CO2 19 (L) 22 - 32 mmol/L   Glucose, Bld 153 (H) 70 - 99 mg/dL   BUN 28 (H) 6 - 20 mg/dL   Creatinine, Ser 3.86 (H) 0.61 - 1.24 mg/dL   Calcium 8.4 (L) 8.9 - 10.3 mg/dL   GFR calc non Af Amer 16 (L) >60 mL/min   GFR calc Af Amer 18 (L) >60 mL/min   Anion gap 14 5 - 15  Glucose, capillary     Status: Abnormal   Collection Time: 10/07/18  7:47 AM  Result Value Ref Range   Glucose-Capillary  135 (H) 70 - 99 mg/dL    Mr Brain Wo Contrast  Result Date: 10/05/2018 CLINICAL DATA:  Altered level of consciousness. EXAM: MRI HEAD WITHOUT CONTRAST TECHNIQUE: Multiplanar, multiecho pulse sequences of the brain and surrounding structures were obtained without intravenous contrast. COMPARISON:  CT head 10/04/2018 FINDINGS: Brain: Ventricle size and cerebral volume normal. Negative for acute infarct, hemorrhage, mass. Scattered small white matter hyperintensities bilaterally likely due to microvascular ischemia. Vascular: Normal arterial flow voids Skull and upper cervical spine: Negative Sinuses/Orbits: Mild mucosal edema paranasal sinuses. Left mastoid effusion. Normal orbit. Other: None IMPRESSION: No acute abnormality. Mild chronic white matter changes likely due to microvascular ischemia. Electronically Signed   By: Franchot Gallo M.D.   On: 10/05/2018 14:36   Mr Abdomen Wo Contrast  Result Date: 10/06/2018 CLINICAL DATA:  Adrenal masses. EXAM: MRI ABDOMEN WITHOUT CONTRAST TECHNIQUE: Multiplanar multisequence MR imaging was performed without the administration of intravenous contrast. COMPARISON:  10/04/2018 and 04/30/2015 FINDINGS: Lower chest: No acute findings. Hepatobiliary: No masses visualized  on this unenhanced exam. Gallbladder is unremarkable. No evidence of biliary ductal dilatation. Pancreas: No mass or inflammatory process visualized on this unenhanced exam. Spleen:  Within normal limits in size. Adrenals/Urinary tract: 5.1 x 2.5 cm left adrenal mass is seen which shows T2 hyperintensity, and a peripheral rim of T1 hyperintensity and T2 hypointensity. This shows no evidence of signal dropout on chemical shift imaging, and has nonspecific characteristics. This is new compared to 2017 exam, and differential diagnosis includes nodular hyperplasia, subacute adrenal hematomas, lipid poor adenomas, and metastases. Masslike thickening in the right adrenal gland measures 3.8 x 2.1 cm. This lesion  shows T1 and T2 hyperintensity and is also new since 2017 exam. No signal dropout is seen on chemical shift imaging. These are nonspecific characteristics. Fluid attenuation renal cysts are noted bilaterally. No evidence of renal mass or hydronephrosis. Stomach/Bowel: Visualized portion unremarkable. Vascular/Lymphatic: No pathologically enlarged lymph nodes identified. No evidence of abdominal aortic aneurysm. Other:  None. Musculoskeletal:  No suspicious bone lesions identified. IMPRESSION: Bilateral adrenal mass, both new since 2017 but with nonspecific imaging characteristics. Differential diagnosis includes nodular hyperplasia, subacute adrenal hematomas, lipid poor adenomas, and metastatic disease. In patient without history of cancer, consider biochemical assays to determine functional status, as well as surgical resection. If patient has cancer history, consider biopsy or PET-CT. This recommendation follows ACR consensus guidelines: Management of Incidental Adrenal Masses: A White Paper of the ACR Incidental Findings Committee. J Am Coll Radiol 2017;14:1038-1044. Electronically Signed   By: Marlaine Hind M.D.   On: 10/06/2018 15:04    Assessment/Plan: 19 Days Post-Op   Principal Problem:   Sepsis ([place]) Active Problems:   Obstructive apnea   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[place])   Chronic obstructive asthma ([place])   Depression with anxiety   Acute renal failure ([place])   Septic shock ([place])   Up with therapy  Continue gait training, strengthening and ROM   Lovell Sheehan , MD 10/07/2018, 10:26 AM

## 2018-10-07 NOTE — Progress Notes (Signed)
Physical Therapy Treatment Patient Details Name: Isaac Cross MRN: [id number] DOB: 1960-09-18 Today's Date: 10/07/2018    History of Present Illness Pt is a 58 y.o. male presenting to hospital 09/12/18 after rolling OOB landing on L knee; pt with increased dizziness x2 days, fevers x3 days, and generalized weakness.  S/p B TKR 09/01/18 in [place] (pt reports discharging home same day).  Pt admitted with sepsis, acute renal failure, and B TKR.  Pt s/p L knee 5/31 and R knee 6/1 I&D with poly exchange.  Pt noted with severe encephalopathy from DT's with extensive ETOH abuse.  L LE DVT noted.  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  HD started 6/5 with L IJ temporary HD catheter.  PMH includes B TKR 09/01/18 in [place], DM, asthma, R vein surgery, COPD. Right IJ line removed 6/16. 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    PT Comments    Pt in bed, ready for session.  Co-tx with OT 1 unit each billed.   BP supine 121/71 P 103 BP sitting 102/66 P 113 BP sitting 2 minutes 97/86 P 109 BP sitting after 10 minutes 98/70 P 117 BP sitting in recliner after transfer 87/68 P 121  To edge of bed with HOB raised and rails but no physical assist today.  Sitting EOB x 25 minutes for BP monitoring and ADL task with OT.  Pt often leaning on knees during sitting.  Stood with min guard +2 with elevated bed height to walker.  Was able to transfer to recliner at bedside with min guard +2 and overall steady transfer.  Seated in recliner after session for breakfast.  Pt remains orthostatic which is primary limiting factor with progression of mobility at this time.  He rates dizziness 5/10 with mobility but does not report any increased.  Cognition stable today with mobility.     Follow Up Recommendations  CIR     Equipment Recommendations       Recommendations for Other Services       Precautions / Restrictions Precautions Precautions: Fall Restrictions Weight Bearing  Restrictions: Yes RLE Weight Bearing: Weight bearing as tolerated LLE Weight Bearing: Weight bearing as tolerated    Mobility  Bed Mobility Overal bed mobility: Needs Assistance Bed Mobility: Supine to Sit Rolling: Min guard         General bed mobility comments: HOB raised and heavy use of rails with increased time but overall no physical assist needed  Transfers Overall transfer level: Needs assistance Equipment used: Right platform walker   Sit to Stand: Min guard;+2 physical assistance            Ambulation/Gait Ambulation/Gait assistance: Min guard;+2 safety/equipment Gait Distance (Feet): 2 Feet Assistive device: Rolling walker (2 wheeled)   Gait velocity: decreased   General Gait Details: Limited by BP and dizziness, to recliner only   Stairs             Wheelchair Mobility    Modified Rankin (Stroke Patients Only)       Balance Overall balance assessment: Mild deficits observed, not formally tested;History of Falls;Needs assistance Sitting-balance support: Feet supported Sitting balance-Leahy Scale: Good Sitting balance - Comments: steady static sitting until fatigued, then leaning down to R elbow.   Standing balance support: Bilateral upper extremity supported;During functional activity Standing balance-Leahy Scale: Fair Standing balance comment: requires BUE support on RW  Cognition Arousal/Alertness: Awake/alert Behavior During Therapy: Flat affect;WFL for tasks assessed/performed Overall Cognitive Status: Impaired/Different from baseline                                        Exercises Other Exercises Other Exercises: sitting EOB x 25 minutes for monitoring BP and OT ADL tasks - shaving    General Comments        Pertinent Vitals/Pain Pain Assessment: No/denies pain    Home Living                      Prior Function            PT Goals (current goals can now  be found in the care plan section) Progress towards PT goals: Progressing toward goals    Frequency    7X/week      PT Plan Current plan remains appropriate    Co-evaluation PT/OT/SLP Co-Evaluation/Treatment: Yes Reason for Co-Treatment: Complexity of the patient's impairments (multi-system involvement);For patient/therapist safety;To address functional/ADL transfers PT goals addressed during session: Mobility/safety with mobility OT goals addressed during session: ADL's and self-care      AM-PAC PT "6 Clicks" Mobility   Outcome Measure  Help needed turning from your back to your side while in a flat bed without using bedrails?: A Little Help needed moving from lying on your back to sitting on the side of a flat bed without using bedrails?: A Little Help needed moving to and from a bed to a chair (including a wheelchair)?: A Little Help needed standing up from a chair using your arms (e.g., wheelchair or bedside chair)?: A Little Help needed to walk in hospital room?: A Lot Help needed climbing 3-5 steps with a railing? : Total 6 Click Score: 15    End of Session Equipment Utilized During Treatment: Gait belt Activity Tolerance: Patient tolerated treatment well;Treatment limited secondary to medical complications (Comment) Patient left: in chair;with call bell/phone within reach;with chair alarm set Nurse Communication: Mobility status Pain - part of body: Knee     Time: 0930-1006 PT Time Calculation (min) (ACUTE ONLY): 36 min  Charges:  $Therapeutic Activity: 8-22 mins                     Chesley Noon, PTA 10/07/18, 10:16 AM

## 2018-10-07 NOTE — Consult Note (Signed)
[facility] Consult Follow-up  Reason for Consult: Cognitive impairment, moca score 7 out of 30 Per physical therapy Referring Physician: Dr. 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 Total Time spent with patient: 15 minutes  Subjective: "Im doing good today I got my archaic machine with me today so I can e-mail my coworkers"  HPI:   Isaac Cross is a 58 y.o. male patient   admitted on 09/12/2018 with a known history of arthritis, COPD not on home oxygen, hypertension, sleep apnea, non-insulin-dependent diabetes mellitus presents to hospital secondary to dizziness, weakness and a fall this morning. Patient had bilateral knee replacement surgeries done about 9 days ago.  He did well for the first week.  Over the last 3 to 4 days he has been having fevers and chills.  Initially had increased frequency of urination that has decreased the last 3 days.  His urine output has been decreased.  He has been feeling dizzy and lightheaded.  Has been nauseous and had one episode of vomiting.  Denies any abdominal pain or diarrhea.  Denies any exposure to sick contacts.  His COVID-19 test is negative here.  Denies any cough or respiratory symptoms or chest pain.  He states that he fell out of bed onto his knees this morning and hurt both his knees.  After the surgery he has been getting home health physical therapy and was able to walk using his walker. In the ED his creatinine is increased from 1 at baseline to 3.7 today.  He received 4.5 L of fluids in the emergency room and his urine output after a Foley catheter was placed here was only 75 cc.  He is  hyponatremic, acidotic.  Urine analysis showing possible infection.  Has lactic acidosis with elevated WBC and procalcitonin.  He is being admitted for sepsis.  Psychiatry consultation is requested for fluctuations in cognition. Record review does indicate that patient has had delirium while hospitalized.  Past Psychiatric History: Anxiety and depression; likely adjustment disorder due to bilateral knee replacement surgeries followed by acute sepsis; delirium per medical records.  10/05/18:  On evaluation this morning, patient is calm and cooperative.  He is alert and oriented x4.  He is situationally aware of hospitalization, personal events/illness and world events.  Patient expresses some frustration regarding his prolonged hospitalization.  He specifically however denies any suicidal ideation, plan or intent stating, "things will never get that bad.  My family depends on me whether I have knees or not."  Patient denies any HI.  He denies any AVH.  Patient expresses that his treatment team is starting to coordinate his discharge planning.  He remains hopeful that he will continue to improve.  Support and encouragement is provided.  10/06/18:  On evaluation this afternoon, patient is pleasant and cooperative.  Denies any depression at this time, low level of anxiety related to wanting to go home and improving.  Complains of feeling tired and fatigued, minimal pain in his knees.  Minimal to no cognitive impairment on assessment.  He was able to talk about his children, grandchildren, and wife.  Identified the ages of children and grandchildren.  No memory  issues noted and was able to move himself up in the bed with polar care in place on his right knee.  Patient appears to be clearing his cognitive impairment.  10/07/2018: During evaluation this afternoon, patient is pleasant smiling and visibly brighter.  He reports that he was able to speak with family and friends with the receipt of his laptop, and that  he "told everyone he is starting to feel better which he was waiting to do until he actually felt better'.  Patient denies depression at this time, and denies current anxiety.  He reports feeling more well rested having gotten a "good sleep" the night before.  Minimal cognitive impairment on assessment, patient continues to present increasingly clear.  He was able to talk about his children, and wife.    Patient appears to be clearing his cognitive impairment.   Risk to Self:  None Risk to Others:  None Prior Inpatient Therapy:  None Prior Outpatient Therapy:  None  Past Medical History:  Past Medical History:  Diagnosis Date  . Allergy   . Anxiety   . Arthritis    knees  . Asthma, mild intermittent, well-controlled   . Chronic obstructive asthma ([place])   . COPD (chronic obstructive pulmonary disease) ([place])   . Depression with anxiety   . Diabetes ([place])   . GERD (gastroesophageal reflux disease)   . HBP (high blood pressure)   . High cholesterol   . Renal cyst, right   . Sleep apnea    CPAP  . Swelling     Past Surgical History:  Procedure Laterality Date  . BLADDER REPAIR    . COLONOSCOPY WITH PROPOFOL N/A 04/07/2015   Procedure: COLONOSCOPY WITH PROPOFOL;  Surgeon: Lucilla Lame, MD;  Location: [place];  Service: Endoscopy;  Laterality: N/A;  Diabetic - oral meds CPAP  . HAND SURGERY     4TH AND 5TH FINGER  . I&D KNEE WITH POLY EXCHANGE Right 09/18/2018   Procedure: IRRIGATION AND DEBRIDEMENT KNEE WITH POLY EXCHANGE;  Surgeon: Lovell Sheehan, MD;  Location: [facility];  Service: Orthopedics;  Laterality: Right;  . KNEE ARTHROSCOPY Right   . 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 TOTAL KNEE ARTHROPLASTY Left 09/17/2018   Procedure: I &D left knee and polyethylene exchange;  Surgeon: Lovell Sheehan, MD;  Location: [facility];  Service: Orthopedics;  Laterality: Left;  Marland Kitchen VEIN SURGERY Right    Family History:  Family History  Problem Relation Age of  Onset  . Hypertension Mother   . Diabetes Mother   . Diabetes Father   . Hypertension Father   . Cancer Father   . Seizures Daughter   . Lupus Daughter   . Cancer Maternal Grandfather        Prostate  . Diabetes Paternal Grandfather   . Prostate cancer Maternal Uncle   . Bladder Cancer Neg Hx   . Kidney cancer Neg Hx    Family Psychiatric  History: Denies  Social History:  Social History   Substance and Sexual Activity  Alcohol Use Yes  . Alcohol/week: 10.0 standard drinks  . Types: 10 Cans of beer per week   Comment: occasional     Social History   Substance and Sexual Activity  Drug Use No    Social History   Socioeconomic History  . Marital status: Married    Spouse name: Not on file  . Number of children: 2  . Years of education: Not on file  .  Highest education level: Bachelor's degree (e.g., BA, AB, BS)  Occupational History  . Occupation: case Freight forwarder   Social Needs  . Financial resource strain: Not hard at all  . Food insecurity    Worry: Never true    Inability: Never true  . Transportation needs    Medical: No    Non-medical: No  Tobacco Use  . Smoking status: Former Smoker    Packs/day: 1.00    Years: 10.00    Pack years: 10.00    Types: Cigarettes    Start date: 04/20/1995    Quit date: 04/19/2005    Years since quitting: 13.4  . Smokeless tobacco: Never Used  Substance and Sexual Activity  . Alcohol use: Yes    Alcohol/week: 10.0 standard drinks    Types: 10 Cans of beer per week    Comment: occasional  . Drug use: No  . Sexual activity: Yes    Partners: Female  Lifestyle  . Physical activity    Days per week: 0 days    Minutes per session: 0 min  . Stress: Not at all  Relationships  . Social connections    Talks on phone: More than three times a week    Gets together: Once a week    Attends religious service: Never    Active member of club or organization: No    Attends meetings of clubs or organizations: Never    Relationship  status: Married  Other Topics Concern  . Not on file  Social History Narrative   Lives at home with wife, using a walker since his surgery recently.   Additional Social History:  Patient is married to his current wife, [name] of 67 years.  He has 2 children ages 84 and 70 from his previous wife. 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    Allergies:   Allergies  Allergen Reactions  . 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 Lipitor [Atorvastatin] Hives and Itching  . Other   . Viagra  [Sildenafil Citrate]     vision loss    Labs:  Results for orders placed or performed during the hospital encounter of 09/12/18 (from the past 48 hour(s))  Glucose, capillary     Status: Abnormal   Collection Time: 10/05/18  4:37 PM  Result Value Ref Range   Glucose-Capillary 108 (H) 70 - 99 mg/dL   Comment 1 Notify RN   Glucose, capillary     Status: Abnormal   Collection Time: 10/05/18  9:05 PM  Result Value Ref Range   Glucose-Capillary 115 (H) 70 - 99 mg/dL  Protime-INR     Status: Abnormal   Collection Time: 10/06/18  6:11 AM  Result Value Ref Range   Prothrombin Time 23.4 (H) 11.4 - 15.2 seconds   INR 2.1 (H) 0.8 - 1.2    Comment: (NOTE) INR goal varies based on device and disease states. Performed at [facility], [address], [place], [place] [address]   Basic metabolic panel     Status: Abnormal   Collection Time: 10/06/18  6:11 AM  Result Value Ref Range   Sodium 135 135 - 145 mmol/L   Potassium 3.6 3.5 - 5.1 mmol/L   Chloride 101 98 - 111 mmol/L   CO2 25 22 - 32 mmol/L   Glucose, Bld 137 (H) 70 - 99 mg/dL   BUN  31 (H) 6 - 20 mg/dL   Creatinine, Ser 5.17 (H) 0.61 - 1.24 mg/dL   Calcium 8.2 (L) 8.9 - 10.3 mg/dL   GFR calc non Af Amer 11 (L) >60 mL/min   GFR calc Af Amer 13 (L) >60 mL/min   Anion gap 9 5 - 15    Comment: Performed at [facility], [address] [place], [place] [address]  Glucose, capillary     Status: Abnormal   Collection Time: 10/06/18  8:16 AM  Result Value Ref Range   Glucose-Capillary 105 (H) 70 - 99 mg/dL   Comment 1 Notify RN   Glucose, capillary     Status: Abnormal   Collection Time: 10/06/18 12:27 PM  Result Value Ref Range   Glucose-Capillary 117 (H) 70 - 99 mg/dL   Comment 1 Notify RN   Glucose, capillary     Status: Abnormal   Collection Time: 10/06/18  5:17 PM  Result Value Ref Range   Glucose-Capillary 107 (H) 70 - 99 mg/dL   Comment 1 Notify RN   Glucose, capillary     Status: Abnormal   Collection Time: 10/06/18  9:03 PM  Result Value Ref Range   Glucose-Capillary 139 (H) 70 - 99 mg/dL  Protime-INR     Status: Abnormal   Collection Time: 10/07/18  4:28 AM  Result Value Ref Range   Prothrombin Time 24.4 (H) 11.4 - 15.2 seconds   INR 2.2 (H) 0.8 - 1.2    Comment: (NOTE) INR goal varies based on device and disease states. Performed at [facility], [address] [place], [place] [address]   Basic metabolic panel     Status: Abnormal   Collection Time: 10/07/18  4:28 AM  Result Value Ref Range   Sodium 137 135 - 145 mmol/L   Potassium 3.4 (L) 3.5 - 5.1 mmol/L   Chloride 104 98 - 111 mmol/L   CO2 19 (L) 22 - 32 mmol/L   Glucose, Bld 153 (H) 70 - 99 mg/dL   BUN 28 (H) 6 - 20 mg/dL   Creatinine, Ser 3.86 (H) 0.61 - 1.24 mg/dL   Calcium 8.4 (L) 8.9 - 10.3 mg/dL   GFR calc non Af Amer 16 (L) >60 mL/min   GFR calc Af Amer 18 (L) >60 mL/min   Anion gap 14 5 - 15    Comment: Performed at [facility], [address] [place], [place] [address]  Glucose, capillary     Status: Abnormal   Collection Time: 10/07/18  7:47 AM  Result Value Ref Range   Glucose-Capillary 135 (H) 70 - 99 mg/dL  Glucose, capillary     Status: Abnormal   Collection Time: 10/07/18 11:59 AM  Result Value Ref Range   Glucose-Capillary 180 (H) 70 - 99 mg/dL    Current  Facility-Administered Medications  Medication Dose Route Frequency Provider Last Rate Last Dose  . 0.9 %  sodium chloride infusion   Intravenous PRN Flora Lipps, MD 5 mL/hr at 09/29/18 0242    . 0.9 %  sodium chloride infusion  250 mL Intravenous PRN Flora Lipps, MD 0 mL/hr at 09/30/18 1534    . acetaminophen (TYLENOL) tablet 650 mg  650 mg Oral Q6H PRN Lovell Sheehan, MD   650 mg at 10/05/18 1613   Or  . acetaminophen (TYLENOL) suppository 650 mg  650 mg Rectal Q6H PRN Lovell Sheehan, MD   650 mg at 09/20/18 0444  . alum & mag  hydroxide-simeth (MAALOX/MYLANTA) 200-200-20 MG/5ML suspension 30 mL  30 mL Oral Q4H PRN Fritzi Mandes, MD      . anticoagulant sodium citrate solution 5 mL  5 mL Intravenous Q dialysis Lateef, Munso[place], MD   5 mL at 09/29/18 1837  . aspirin EC tablet 81 mg  81 mg Oral Daily Lovell Sheehan, MD   81 mg at 10/07/18 1308  . Chlorhexidine Gluconate Cloth 2 % PADS 6 each  6 each Topical Q0600 Lovell Sheehan, MD   6 each at 10/07/18 0522  . citalopram (CELEXA) tablet 10 mg  10 mg Oral Daily Patrecia Pour, NP   10 mg at 10/07/18 0840  . DAPTOmycin (CUBICIN) 700 mg in sodium chloride 0.9 % IVPB  700 mg Intravenous Q48H Berton Mount, RPH   Stopped at 10/05/18 2124  . feeding supplement (NEPRO CARB STEADY) liquid 237 mL  237 mL Oral BID BM Flora Lipps, MD 237 mL/hr at 09/25/18 0951 237 mL at 10/07/18 0837  . fludrocortisone (FLORINEF) tablet 0.1 mg  0.1 mg Oral Daily Hillary Bow, MD   0.1 mg at 10/07/18 0840  . fluticasone furoate-vilanterol (BREO ELLIPTA) 100-25 MCG/INH 1 puff  1 puff Inhalation Daily Fritzi Mandes, MD   1 puff at 10/07/18 [phone]  . folic acid (FOLVITE) tablet 1 mg  1 mg Oral Daily Fritzi Mandes, MD   1 mg at 10/07/18 0840  . HYDROcodone-acetaminophen (NORCO/VICODIN) 5-325 MG per tablet 1-2 tablet  1-2 tablet Oral Q4H PRN Lovell Sheehan, MD   2 tablet at 09/19/18 0450  . hydrocortisone (CORTEF) tablet 20 mg  20 mg Oral BID Hillary Bow, MD   20 mg at  10/07/18 0840  . insulin aspart (novoLOG) injection 0-5 Units  0-5 Units Subcutaneous QHS Fritzi Mandes, MD      . insulin aspart (novoLOG) injection 0-9 Units  0-9 Units Subcutaneous TID WC Fritzi Mandes, MD   2 Units at 10/07/18 1215  . iohexol (OMNIPAQUE) 240 MG/ML injection 50 mL  50 mL Oral Once PRN Awilda Bill, NP      . ipratropium-albuterol (DUONEB) 0.5-2.5 (3) MG/3ML nebulizer solution 3 mL  3 mL Nebulization Q6H PRN Flora Lipps, MD   3 mL at 09/25/18 0721  . levofloxacin (LEVAQUIN) IVPB 750 mg  750 mg Intravenous Q48H Tsosie Billing, MD   Stopped at 10/06/18 1929  . MEDLINE mouth rinse  15 mL Mouth Rinse BID Flora Lipps, MD   15 mL at 10/07/18 0840  . menthol-cetylpyridinium (CEPACOL) lozenge 3 mg  1 lozenge Oral PRN Lovell Sheehan, MD       Or  . phenol (CHLORASEPTIC) mouth spray 1 spray  1 spray Mouth/Throat PRN Lovell Sheehan, MD      . multivitamin with minerals tablet 1 tablet  1 tablet Oral Daily Fritzi Mandes, MD   1 tablet at 10/07/18 0840  . omega-3 acid ethyl esters (LOVAZA) capsule 1 g  1 g Oral Daily Fritzi Mandes, MD   1 g at 10/07/18 0841  . ondansetron (ZOFRAN) tablet 4 mg  4 mg Oral Q6H PRN Lovell Sheehan, MD       Or  . ondansetron [facility]) injection 4 mg  4 mg Intravenous Q6H PRN Lovell Sheehan, MD      . sodium bicarbonate tablet 650 mg  650 mg Oral BID Loney Hering D, MD   650 mg at 10/07/18 1215  . sodium chloride flush (NS) 0.9 % injection 3 mL  3 mL Intravenous Q12H Flora Lipps, MD   3 mL at 10/07/18 0841  . sodium chloride flush (NS) 0.9 % injection 3 mL  3 mL Intravenous PRN Flora Lipps, MD   3 mL at 09/24/18 0805  . warfarin (COUMADIN) tablet 6 mg  6 mg Oral q1800 Charlett Nose, RPH      . Warfarin - Pharmacist Dosing Inpatient   Does not apply [id number] Fritzi Mandes, MD        Musculoskeletal: Strength & Muscle Tone: decreased Gait & Station: unsteady Patient leans: N/A  Psychiatric Specialty Exam: Physical Exam  Nursing note and  vitals reviewed. Constitutional: He is oriented to person, place, and time. He appears well-developed and well-nourished. No distress.  HENT:  Head: Normocephalic and atraumatic.  Eyes: EOM are normal.  Neck: Normal range of motion.  Cardiovascular: Normal rate and regular rhythm.  Respiratory: Effort normal. No respiratory distress.  Musculoskeletal: Normal range of motion.  Neurological: He is alert and oriented to person, place, and time.  Psychiatric: He has a normal mood and affect. His speech is normal and behavior is normal. Judgment and thought content normal. Cognition and memory are normal.    Review of Systems  Constitutional: Positive for malaise/fatigue.  Respiratory: Negative.   Cardiovascular: Negative.   Gastrointestinal: Negative.   Musculoskeletal: Negative.   Neurological: Negative.   Psychiatric/Behavioral: Positive for memory loss.    Blood pressure (!) 141/86, pulse 93, temperature 98.8 F (37.1 C), temperature source Oral, resp. rate 18, height 6\' 4"  (1.93 m), weight (!) 156 kg, SpO2 98 %.Body mass index is 41.86 kg/m.  General Appearance: Casual and Neat  Eye Contact:  Good  Speech:  Clear and Coherent and Normal Rate  Volume:  Normal  Mood:  Euthymic  Affect:  Appropriate  Thought Process:  Coherent, Linear and Descriptions of Associations: Intact  Orientation:  Full (Time, Place, and Person)  Thought Content:  Logical and Hallucinations: None  Suicidal Thoughts:  No  Homicidal Thoughts:  No  Memory:  Good  Judgement:  Fair  Insight:  Fair  Psychomotor Activity:  Decreased  Concentration:  Concentration: Good and Attention Span: Good  Recall:  [place] of Knowledge:  Good  Language:  Good  Akathisia:  No  Handed:  Right  AIMS (if indicated):     Assets:  Communication Skills Desire for Improvement Financial Resources/Insurance Housing Intimacy Social Support Talents/Skills Vocational/Educational  ADL's:  Impaired  Cognition:  WNL   Sleep:   Patient has obstructive sleep apnea, uses BiPAP at night     Treatment Plan Summary: Daily contact with patient to assess and evaluate symptoms and progress in treatment and Medication management  Depression and anxiety Continue Celexa at 10 mg daily for anxiety and depression.  Patient likely has ongoing delirium which presents as fluctuating cognition.  At time of this assessment patient has full capacity.  The patient's exam is notable for altered sensorium, perceptual disturbances, disorientation and cognitive deficits that appear markedly different than their baseline, suggesting a diagnosis of delirium.  Virtually any medical condition or physiologic stress can precipitate delirium in a susceptible individual, with risk increasing in those with: advanced age, sensory impairments, organic brain disease (stroke, dementia, Parkinsons), psychiatric illness, major chronic medical issues, prolonged hospitalizations, postoperative status, anemia, insomnia/disturbed sleep, and severe pain. Addressing the underlying medical condition and institution of preventative measures are recommended.  - Continue to monitor and treat underlying medical causes of delirium, including infection, electrolyte disturbances, etc. -  Delirium precautions - Minimize/avoid deliriogenic meds including: anticholinergic, opiates, benzodiazepines           - Maintain hydration, oxygenation, nutrition           - Limit use of restraints and catheters           - Normalize sleep patterns by minimizing nighttime noise, light and interruptions by     -Ensure sleep apnea treatment is provided overnight.             clustering care, opening blinds during the day           - Reorient the patient frequently, provide easily visible clock and calendar           - Provide sensory aids like glasses, hearing aids           - Encourage ambulation, regular activities and visitors to maintain cognitive stimulation   -Patient  would benefit from having family members at bedside to reinforce his orientation.  Disposition: No evidence of imminent risk to self or others at present.   Patient does not meet criteria for psychiatric inpatient admission. Supportive therapy provided about ongoing stressors.  Waylan Boga, NP 10/07/2018 3:11 PM

## 2018-10-07 NOTE — Progress Notes (Addendum)
[facility] [place] NAME: Isaac Cross    MR#:  [id number]  DATE OF BIRTH:  [birth year]  SUBJECTIVE:   Awake and alert.  Patient without complaint, noted continued orthostatic hypotension with PT/OT-they are now recommending: Inpatient rehab, nephrology/ID input greatly appreciated  REVIEW OF SYSTEMS:  Review of Systems  Constitutional: Positive for malaise/fatigue. Negative for chills, fever and weight loss.  HENT: Negative for ear discharge, ear pain and nosebleeds.   Eyes: Negative for blurred vision, pain and discharge.  Respiratory: Negative for sputum production, shortness of breath, wheezing and stridor.   Cardiovascular: Negative for chest pain, palpitations, orthopnea and PND.  Gastrointestinal: Negative for abdominal pain, diarrhea, nausea and vomiting.  Genitourinary: Negative for frequency and urgency.  Musculoskeletal: Negative for back pain and joint pain.  Neurological: Negative for sensory change, speech change, focal weakness and weakness.  Psychiatric/Behavioral: Negative for depression and hallucinations. The patient is not nervous/anxious.    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 rate 18, height 6\' 4"  (1.93 m), weight (!) 156 kg, SpO2 98 %.  PHYSICAL EXAMINATION:  Physical Exam   GENERAL:  58 y.o.-year-old patient lying in the bed with no acute distress. Critically ill appearing EYES: Pupils equal, round, reactive to light and accommodation. No scleral icterus.  HEENT: Head atraumatic, normocephalic. Oropharynx and nasopharynx clear.  NECK:  Supple, no jugular venous distention. No thyroid enlargement, no tenderness.    LUNGS: Normal breath sounds bilaterally, no wheezing, rales,rhonchi or crepitation. No use of accessory muscles of respiration.  Decreased bibasilar breath sounds CARDIOVASCULAR: S1, S2 normal. No murmurs, rubs, or gallops. ABDOMEN: Soft, nontender, nondistended. Bowel sounds present. No organomegaly or mass. EXTREMITIES: Swelling of both legs, bilateral knee dressing.  No  cyanosis, or clubbing.  NEUROLOGIC: Cranial nerves II through XII are intact. Moving all extremities well PSYCHIATRIC: patient is alert and oriented x3 SKIN: No obvious rash, lesion, or ulcer.   LABORATORY PANEL:   CBC Recent Labs  Lab 10/05/18 0506  WBC 8.3  HGB 7.8*  HCT 24.3*  PLT 354   ------------------------------------------------------------------------------------------------------------------  Chemistries  Recent Labs  Lab 10/02/18 0506  10/07/18 0428  NA 137   < > 137  K 3.4*   < > 3.4*  CL 99   < > 104  CO2 24   < > 19*  GLUCOSE 106*   < > 153*  BUN 36*   < > 28*  CREATININE 8.63*   < > 3.86*  CALCIUM 8.3*   < > 8.4*  MG 1.9  --   --    < > = values in this interval not displayed.   ------------------------------------------------------------------------------------------------------------------  Cardiac Enzymes No results for input(s): TROPONINI in the last 168 hours. ------------------------------------------------------------------------------------------------------------------  RADIOLOGY:  Mr Brain Wo Contrast  Result Date: 10/05/2018 CLINICAL DATA:  Altered level of consciousness. EXAM: MRI HEAD WITHOUT CONTRAST TECHNIQUE: Multiplanar, multiecho pulse sequences of the brain and surrounding structures were obtained without intravenous contrast. COMPARISON:  CT head 10/04/2018 FINDINGS: Brain: Ventricle size and cerebral volume normal. Negative for acute infarct, hemorrhage, mass. Scattered small white matter hyperintensities bilaterally likely due to  microvascular ischemia.  Vascular: Normal arterial flow voids Skull and upper cervical spine: Negative Sinuses/Orbits: Mild mucosal edema paranasal sinuses. Left mastoid effusion. Normal orbit. Other: None IMPRESSION: No acute abnormality. Mild chronic white matter changes likely due to microvascular ischemia. Electronically Signed   By: Franchot Gallo M.D.   On: 10/05/2018 14:36   Mr Abdomen Wo Contrast  Result Date: 10/06/2018 CLINICAL DATA:  Adrenal masses. EXAM: MRI ABDOMEN WITHOUT CONTRAST TECHNIQUE: Multiplanar multisequence MR imaging was performed without the administration of intravenous contrast. COMPARISON:  10/04/2018 and 04/30/2015 FINDINGS: Lower chest: No acute findings. Hepatobiliary: No masses visualized on this unenhanced exam. Gallbladder is unremarkable. No evidence of biliary ductal dilatation. Pancreas: No mass or inflammatory process visualized on this unenhanced exam. Spleen:  Within normal limits in size. Adrenals/Urinary tract: 5.1 x 2.5 cm left adrenal mass is seen which shows T2 hyperintensity, and a peripheral rim of T1 hyperintensity and T2 hypointensity. This shows no evidence of signal dropout on chemical shift imaging, and has nonspecific characteristics. This is new compared to 2017 exam, and differential diagnosis includes nodular hyperplasia, subacute adrenal hematomas, lipid poor adenomas, and metastases. Masslike thickening in the right adrenal gland measures 3.8 x 2.1 cm. This lesion shows T1 and T2 hyperintensity and is also new since 2017 exam. No signal dropout is seen on chemical shift imaging. These are nonspecific characteristics. Fluid attenuation renal cysts are noted bilaterally. No evidence of renal mass or hydronephrosis. Stomach/Bowel: Visualized portion unremarkable. Vascular/Lymphatic: No pathologically enlarged lymph nodes identified. No evidence of abdominal aortic aneurysm. Other:  None. Musculoskeletal:  No suspicious bone lesions identified. IMPRESSION: Bilateral adrenal mass,  both new since 2017 but with nonspecific imaging characteristics. Differential diagnosis includes nodular hyperplasia, subacute adrenal hematomas, lipid poor adenomas, and metastatic disease. In patient without history of cancer, consider biochemical assays to determine functional status, as well as surgical resection. If patient has cancer history, consider biopsy or PET-CT. This recommendation follows ACR consensus guidelines: Management of Incidental Adrenal Masses: A White Paper of the ACR Incidental Findings Committee. J Am Coll Radiol 2017;14:1038-1044. Electronically Signed   By: Marlaine Hind M.D.   On: 10/06/2018 15:04    EKG:   Orders placed or performed during the hospital encounter of 09/12/18   EKG 12-Lead   EKG 12-Lead   EKG 12-Lead   EKG 12-Lead   EKG 12-Lead   EKG 12-Lead    ASSESSMENT AND PLAN:  EdwardGradyis a58 y.o.malewith a known history of arthritis, COPD not on home oxygen, hypertension, sleep apnea, non-insulin-dependent diabetes mellitus presents to hospital secondary to dizziness, weakness and a fall  * Sepsis Resolved Suspected due to seeding of bilateral knee replacements, s/p recent bilateral knee surgeries, s/p bilateral knee washouts done this admission Fluid Cultures neg - but patient was on antibiotics prior to his washout and sampling Fungal cultures negative - completed 7 days of anidulafungin  Continue daptomycin/Levaquin for 4-week course  - last dose 10/31/2018  *Acute non- Oliguric renal failure likely ATN from sepsis/hypotension Resolving, nephrology input greatly appreciated, status post hemodialysis 09/22/2018 Continue to hold lisinopril/metformin, avoid nephrotoxic agents, strict I&O monitoring, daily weights  * Acute encephalopathy-secondary to metabolic causes, sepsis and uremia. -No focal deficits Patient also went through delirium tremens and ICU delirium MRi Brain - Nothing acute Consulted neurology Dr. Melonie Florida. Appreciate  input.  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  *Primary adrenal insufficiency Low cortisol level.  High ACTH  level.  No response to cosyntropin dose. 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  Continue Florinef and hydrocortisone  * Anemia of chronic disease Stable  * Hypokalemia Replete   *Hypomagnesemia Repleted  DVT prophylaxis-on Coumadin  Disposition to: Inpatient rehab when bed is available, 2-4 days  CODE STATUS: Full code  TOTAL TIME TAKING CARE OF THIS PATIENT: 35 minutes.   Isaac Cross M.D on 10/07/2018 at 11:16 AM  Between 7am to 6pm - Pager - [phone]  After 6pm go to [email or web address] - password [email or web address] [facility]  Office  [phone]  CC: Primary care physician; Steele Sizer, MD

## 2018-10-07 NOTE — Progress Notes (Signed)
Physical Therapy Treatment Patient Details Name: Isaac Cross MRN: [id number] DOB: 1961-03-24 Today's Date: 10/07/2018    History of Present Illness Pt is a 58 y.o. male presenting to hospital 09/12/18 after rolling OOB landing on L knee; pt with increased dizziness x2 days, fevers x3 days, and generalized weakness.  S/p B TKR 09/01/18 in [place] (pt reports discharging home same day).  Pt admitted with sepsis, acute renal failure, and B TKR.  Pt s/p L knee 5/31 and R knee 6/1 I&D with poly exchange.  Pt noted with severe encephalopathy from DT's with extensive ETOH abuse.  L LE DVT noted.  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  HD started 6/5 with L IJ temporary HD catheter.  PMH includes B TKR 09/01/18 in [place], DM, asthma, R vein surgery, COPD. Right IJ line removed 6/16. 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    PT Comments    Pt in recliner.  Pt had put his feet down and was leaning to the right in recliner.  Asked if he was ok and pt stated he wanted to get back to bed.  Encouraged OOB longer but he requested to return to bed.  Due to pt falls and trying to get up on his own in the past, he was assisted back to bed with min a x 2.  Overall standing from recliner improved.  Returned to supine with min a x 1 for LE positioning.   Follow Up Recommendations  CIR     Equipment Recommendations  3in1 (PT);Other (comment)    Recommendations for Other Services       Precautions / Restrictions Precautions Precautions: Fall Precaution Comments: orthostatic, slow to respond at times Restrictions Weight Bearing Restrictions: No RLE Weight Bearing: Weight bearing as tolerated LLE Weight Bearing: Weight bearing as tolerated Other Position/Activity Restrictions: WBAT B LE's    Mobility  Bed Mobility Overal bed mobility: Needs Assistance Bed Mobility: Sit to Supine Rolling: Min guard;Min assist   Supine to sit: Min guard;HOB elevated     General bed  mobility comments: increased time and use of rails  Transfers Overall transfer level: Needs assistance Equipment used: Rolling walker (2 wheeled) Transfers: Sit to/from Stand Sit to Stand: +2 physical assistance;Min assist         General transfer comment: min guard +2 for safety due to pt decreased awareness, but overall no more than close min guard for safety needed to chair  Ambulation/Gait Ambulation/Gait assistance: Min guard;+2 safety/equipment Gait Distance (Feet): 2 Feet Assistive device: Rolling walker (2 wheeled)   Gait velocity: decreased   General Gait Details: Limited by BP and dizziness, to recliner only   Chief Strategy Officer    Modified Rankin (Stroke Patients Only)       Balance Overall balance assessment: Needs assistance Sitting-balance support: Feet supported Sitting balance-Leahy Scale: Good Sitting balance - Comments: able to static sit and engage in shavnig for ~10 mins. Leans on elbows when fatigued   Standing balance support: Bilateral upper extremity supported;During functional activity Standing balance-Leahy Scale: Fair Standing balance comment: reliant opn BUE external support                            Cognition Arousal/Alertness: Awake/alert Behavior During Therapy: Flat affect;WFL for tasks assessed/performed Overall Cognitive Status: Impaired/Different from baseline Area of Impairment: Following commands;Safety/judgement;Memory;Problem solving  Memory: Decreased short-term memory Following Commands: Follows multi-step commands with increased time;Follows one step commands with increased time Safety/Judgement: Decreased awareness of safety;Decreased awareness of deficits   Problem Solving: Slow processing;Decreased initiation;Requires verbal cues;Difficulty sequencing General Comments: pt appropriately conversive with OT this date, slow to respond and process without  session. Denies assistance from OT during t/f showing decreased awareness.      Exercises Other Exercises Other Exercises: sitting EOB x 25 minutes for monitoring BP and OT ADL tasks - shaving    General Comments        Pertinent Vitals/Pain Pain Assessment: No/denies pain Faces Pain Scale: Hurts little more Pain Location: B knees Pain Descriptors / Indicators: Sore;Discomfort;Grimacing Pain Intervention(s): Limited activity within patient's tolerance;Monitored during session;Repositioned    Home Living                      Prior Function            PT Goals (current goals can now be found in the care plan section) Acute Rehab PT Goals Patient Stated Goal: to go home Progress towards PT goals: Progressing toward goals    Frequency    7X/week      PT Plan Current plan remains appropriate    Co-evaluation PT/OT/SLP Co-Evaluation/Treatment: Yes Reason for Co-Treatment: Complexity of the patient's impairments (multi-system involvement);To address functional/ADL transfers PT goals addressed during session: Mobility/safety with mobility OT goals addressed during session: ADL's and self-care      AM-PAC PT "6 Clicks" Mobility   Outcome Measure  Help needed turning from your back to your side while in a flat bed without using bedrails?: A Little Help needed moving from lying on your back to sitting on the side of a flat bed without using bedrails?: A Little Help needed moving to and from a bed to a chair (including a wheelchair)?: A Little Help needed standing up from a chair using your arms (e.g., wheelchair or bedside chair)?: A Little Help needed to walk in hospital room?: A Lot Help needed climbing 3-5 steps with a railing? : Total 6 Click Score: 15    End of Session Equipment Utilized During Treatment: Gait belt Activity Tolerance: Patient tolerated treatment well;Treatment limited secondary to medical complications (Comment) Patient left: in  bed;with call bell/phone within reach;with bed alarm set Nurse Communication: Mobility status Pain - part of body: Knee     Time: 1100-1109 PT Time Calculation (min) (ACUTE ONLY): 9 min  Charges:  $Therapeutic Activity: 8-22 mins                    Chesley Noon, PTA 10/07/18, 12:53 PM

## 2018-10-08 LAB — FUNGAL ANTIBODIES PANEL, ID-BLOOD
Aspergillus flavus: NEGATIVE
Aspergillus fumigatus, IgG: NEGATIVE
Aspergillus niger: NEGATIVE
Blastomyces Abs, Qn, DID: NEGATIVE
Histoplasma Ab, Immunodiffusion: NEGATIVE

## 2018-10-08 LAB — GLUCOSE, CAPILLARY
Glucose-Capillary: 136 mg/dL — ABNORMAL HIGH (ref 70–99)
Glucose-Capillary: 164 mg/dL — ABNORMAL HIGH (ref 70–99)
Glucose-Capillary: 161 mg/dL — ABNORMAL HIGH (ref 70–99)
Glucose-Capillary: 113 mg/dL — ABNORMAL HIGH (ref 70–99)

## 2018-10-08 LAB — BASIC METABOLIC PANEL
Anion gap: 10 (ref 5–15)
BUN: 26 mg/dL — ABNORMAL HIGH (ref 6–20)
CO2: 22 mmol/L (ref 22–32)
Calcium: 8.3 mg/dL — ABNORMAL LOW (ref 8.9–10.3)
Chloride: 105 mmol/L (ref 98–111)
Creatinine, Ser: 2.85 mg/dL — ABNORMAL HIGH (ref 0.61–1.24)
GFR calc Af Amer: 27 mL/min — ABNORMAL LOW (ref >60)
GFR calc non Af Amer: 23 mL/min — ABNORMAL LOW (ref >60)
Glucose, Bld: 159 mg/dL — ABNORMAL HIGH (ref 70–99)
Potassium: 3.4 mmol/L — ABNORMAL LOW (ref 3.5–5.1)
Sodium: 137 mmol/L (ref 135–145)

## 2018-10-08 LAB — CK: Total CK: 86 U/L (ref 49–397)

## 2018-10-08 LAB — PROTIME-INR
INR: 2.3 — ABNORMAL HIGH (ref 0.8–1.2)
Prothrombin Time: 25.1 seconds — ABNORMAL HIGH (ref 11.4–15.2)

## 2018-10-08 MED ORDER — POTASSIUM CHLORIDE 20 MEQ PO PACK
40.0000 meq | PACK | Freq: Once | ORAL | Status: AC
  Administered 2018-10-08: 40 meq via ORAL
  Filled 2018-10-08: qty 2

## 2018-10-08 MED ORDER — SODIUM CHLORIDE 0.9 % IV SOLN
700.0000 mg | Freq: Every day | INTRAVENOUS | Status: DC
  Administered 2018-10-08: 700 mg via INTRAVENOUS
  Filled 2018-10-08 (×2): qty 14

## 2018-10-08 NOTE — Progress Notes (Signed)
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  L DVT Argatroban drip discontinued 10/03/2018  Allergies  Allergen Reactions  . 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 Lipitor [Atorvastatin] Hives and Itching  . Other   . Viagra  [Sildenafil Citrate]     vision loss    Patient Measurements: Height: 6\' 4"  (193 cm) Weight: (!) 343 lb 14.7 oz (156 kg) IBW/kg (Calculated) : 86.8  Vital Signs: Temp: 97.5 F (36.4 C) (06/21 0744) Temp Source: Oral (06/21 0744) BP: 140/87 (06/21 0744) Pulse Rate: 95 (06/21 0744)  Labs: Recent Labs    10/06/18 0611 10/07/18 0428 10/08/18 0547  LABPROT 23.4* 24.4* 25.1*  INR 2.1* 2.2* 2.3*  CREATININE 5.17* 3.86* 2.85*  CKTOTAL  --   --  86    Estimated Creatinine Clearance: 44.6 mL/min (A) (by C-G formula based on SCr of 2.85 mg/dL (H)).   Medical History: Past Medical History:  Diagnosis Date  . Allergy   . Anxiety   . Arthritis    knees  . Asthma, mild intermittent, well-controlled   . Chronic obstructive asthma ([place])   . COPD (chronic obstructive pulmonary disease) ([place])   . Depression with anxiety   . Diabetes ([place])   . GERD (gastroesophageal reflux disease)   . HBP (high blood pressure)   . High cholesterol   . Renal cyst, right   . Sleep apnea    CPAP  . Swelling     Medications:  Medications Prior to Admission  Medication Sig Dispense Refill Last Dose  . acetaminophen (TYLENOL) 500 MG tablet Take 1 tablet (500 mg total) by mouth every 6 (six) hours as needed. 90 tablet 0 09/12/2018 at 0800  . albuterol (PROVENTIL HFA;VENTOLIN HFA) 108 (90 Base) MCG/ACT inhaler INHALE 2 PUFFS INTO THE LUNGS 4 (FOUR) TIMES DAILY. 6.7 Inhaler 0 09/12/2018 at 0800  . aspirin 81 MG tablet Take 81 mg by mouth daily.   09/12/2018 at 0800  . atenolol (TENORMIN) 25 MG tablet Take 1 tablet (25 mg total) by mouth every evening. 90 tablet 1 09/11/2018 at 2000  .  cephALEXin (KEFLEX) 500 MG capsule Take 500 mg by mouth 3 (three) times daily.   09/12/2018 at 0800  . citalopram (CELEXA) 20 MG tablet Take 1 tablet (20 mg total) by mouth daily. 90 tablet 1 09/12/2018 at 0800  . fluticasone furoate-vilanterol (BREO ELLIPTA) 100-25 MCG/INH AEPB Inhale 1 puff into the lungs daily. 180 each 1 09/12/2018 at 0800  . Multiple Vitamins-Minerals (MENS MULTIVITAMIN PLUS) TABS Take by mouth.   09/12/2018 at 0800  . Omega-3 Fatty Acids (FISH OIL) 1000 MG CAPS Take by mouth daily.   09/12/2018 at 0800  . oxyCODONE-acetaminophen (PERCOCET/ROXICET) 5-325 MG tablet Take 1 tablet by mouth every 4 (four) hours.   09/12/2018 at 0800  . rosuvastatin (CRESTOR) 20 MG tablet TAKE 1 TABLET BY MOUTH EVERY DAY 90 tablet 1 09/12/2018 at 0800  . SENNA-PLUS 8.6-50 MG tablet Take 2 tablets by mouth every morning.   09/12/2018 at 0800  . sulfamethoxazole-trimethoprim (BACTRIM DS) 800-160 MG tablet Take 1 tablet by mouth 2 (two) times a day.   09/12/2018 at 0800  . XARELTO 10 MG TABS tablet Take 10 mg by mouth daily.    09/12/2018 at 0800  . cetirizine (ZYRTEC) 10 MG tablet Take 10 mg by mouth daily.   prn at prn  . diclofenac sodium (VOLTAREN) 1 % GEL APPLY 4  GRAMS TOPICALLY FOUR TIMES A DAY 100 g 2 prn at prn  . lidocaine (XYLOCAINE) 2 % solution Use as directed 15 mLs in the mouth or throat as needed for mouth pain. 100 mL 0 prn at prn  . triamcinolone cream (KENALOG) 0.1 % triamcinolone acetonide 0.1 % topical cream   prn at prn    Assessment: 58 yr male had B/l knee replacements Sep 01, 2018 was admitted with fever, AKI/ Platelet count has been declining since 6/5. He was started on heparin 6/3. Child Pugh score estimated to be 5-6  HIT lab resulted 6/8 1740 - Abnormal 1.854, ordered SRA  Goal of Therapy:  Monitor platelets by anticoagulation protocol: Yes INR: 2-3   Plan:  Continue warfarin 6mg  Q1800. Will obtain INR daily.   Patient on levofloxacin and daptomycin  Pharmacy will  continue to monitor and adjust per consult.   Simpson,Michael L, RPh 10/08/2018 8:28 AM

## 2018-10-08 NOTE — Progress Notes (Signed)
Pharmacy Antibiotic Note  Isaac Cross is a 58 y.o. male admitted on 09/12/2018 with sepsis.  Pharmacy has been consulted for Daptomycin dosing.  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  He underwent I&D of both knees.  Cultures remain unrevealing but had received antibiotics prior to surgery.  He developed AKI and was requiring HD but Nephrology is hopeful that his kidney function is returned and may not need further HD (UOP improved).    Day #26 antibiotics/Day #10 daptomyicn/Day #4 Levaquin Per ID-plan abx thru 10/31/2018 (4 weeks) per neph note   Plan: Levaquin 750mg  IV q48h.   Continue Daptomycin 6mg /kg (per Adj BW for BMI >40) 700mg  IV q24h.  Check CK level in 3 days since had recent CK elevation and dose changed to Q24hr dosing.    Height: 6\' 4"  (193 cm) Weight: (!) 343 lb 14.7 oz (156 kg) IBW/kg (Calculated) : 86.8  Temp (24hrs), Avg:98.2 F (36.8 C), Min:97.5 F (36.4 C), Max:98.7 F (37.1 C)  Recent Labs  Lab 10/02/18 0506 10/03/18 0302 10/04/18 0542 10/05/18 0506 10/06/18 0611 10/07/18 0428 10/08/18 0547  WBC 10.3 10.4 10.2  10.4 8.3  --   --   --   CREATININE 8.63* 8.38* 7.51* 6.63* 5.17* 3.86* 2.85*    Estimated Creatinine Clearance: 44.6 mL/min (A) (by C-G formula based on SCr of 2.85 mg/dL (H)).    Allergies  Allergen Reactions  . 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 Lipitor [Atorvastatin] Hives and Itching  . Other   . Viagra  [Sildenafil Citrate]     vision loss     Thank you for allowing pharmacy to be a part of this patient's care.  Currie Paris, RPh  10/08/2018

## 2018-10-08 NOTE — Consult Note (Signed)
[facility] Consult Follow-up  Reason for Consult: Cognitive impairment, moca score 7 out of 30 Per physical therapy Referring Physician: Dr. 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 Total Time spent with patient: 15 minutes  Subjective: "I'm doing ok today."  HPI:   Isaac Cross is a 58 y.o. male patient   admitted on 09/12/2018 with a known history of arthritis, COPD not on home oxygen, hypertension, sleep apnea, non-insulin-dependent diabetes mellitus presents to hospital secondary to dizziness, weakness and a fall this morning. Patient had bilateral knee replacement surgeries done about 9 days ago.  He did well for the first week.  Over the last 3 to 4 days he has been having fevers and chills.  Initially had increased frequency of urination that has decreased the last 3 days.  His urine output has been decreased.  He has been feeling dizzy and lightheaded.  Has been nauseous and had one episode of vomiting.  Denies any abdominal pain or diarrhea.  Denies any exposure to sick contacts.  His COVID-19 test is negative here.  Denies any cough or respiratory symptoms or chest pain.  He states that he fell out of bed onto his knees this morning and hurt both his knees.  After the surgery he has been getting home health physical therapy and was able to walk using his walker. In the ED his creatinine is increased from 1 at baseline to 3.7 today.  He received 4.5 L of fluids in the emergency room and his urine output after a Foley catheter was placed here was only 75 cc.  He is hyponatremic, acidotic.  Urine analysis showing possible infection.  Has  lactic acidosis with elevated WBC and procalcitonin.  He is being admitted for sepsis.  Psychiatry consultation is requested for fluctuations in cognition. Record review does indicate that patient has had delirium while hospitalized.  Past Psychiatric History: Anxiety and depression; likely adjustment disorder due to bilateral knee replacement surgeries followed by acute sepsis; delirium per medical records.  10/05/18:  On evaluation this morning, patient is calm and cooperative.  He is alert and oriented x4.  He is situationally aware of hospitalization, personal events/illness and world events.  Patient expresses some frustration regarding his prolonged hospitalization.  He specifically however denies any suicidal ideation, plan or intent stating, "things will never get that bad.  My family depends on me whether I have knees or not."  Patient denies any HI.  He denies any AVH.  Patient expresses that his treatment team is starting to coordinate his discharge planning.  He remains hopeful that he will continue to improve.  Support and encouragement is provided.  10/06/18:  On evaluation this afternoon, patient is pleasant and cooperative.  Denies any depression at this time, low level of anxiety related to wanting to go home and improving.  Complains of feeling tired and fatigued, minimal pain in his knees.  Minimal to no cognitive impairment on assessment.  He was able to talk about his children, grandchildren, and wife.  Identified the ages of children and grandchildren.  No memory issues noted and was able to move himself up in the bed with polar  care in place on his right knee.  Patient appears to be clearing his cognitive impairment.  10/07/2018: During evaluation this afternoon, patient is pleasant smiling and visibly brighter.  He reports that he was able to speak with family and friends with the receipt of his laptop, and that he "told everyone he is starting to feel better which he was waiting to  do until he actually felt better'.  Patient denies depression at this time, and denies current anxiety.  He reports feeling more well rested having gotten a "good sleep" the night before.  Minimal cognitive impairment on assessment, patient continues to present increasingly clear.  He was able to talk about his children, and wife.    Patient appears to be clearing his cognitive impairment.  10/08/2018: Patient presents calm lying down in bed looking at his laptop.  During assessment patient jokes appropriately with this provider, when commenting on the large amount of flowers and balloons in his room he states "Well ill let you in on a little secret my dear, they only give you those when they think your going to die then the old ladies fight over who gets them after" and laughs.  He smiled then stated "the jokes on them i'm coming home after this antibiotic".   He was able to recall recent conversation and repeated his plan of care of looking for an inpatient facility to receive IV ABT at.  Patient denies depression at this time, and denies current anxiety.  Minimal cognitive impairment on assessment, patient continues to present increasingly clear.  He was able to continue to talk about his children, and wife, he reports his son going back to work in Architect and that [place] was "messing things up" for everyone.    Patient appears to be clearing his cognitive impairment.  Risk to Self:  None Risk to Others:  None Prior Inpatient Therapy:  None Prior Outpatient Therapy:  None  Past Medical History:  Past Medical History:  Diagnosis Date  . Allergy   . Anxiety   . Arthritis    knees  . Asthma, mild intermittent, well-controlled   . Chronic obstructive asthma ([place])   . COPD (chronic obstructive pulmonary disease) ([place])   . Depression with anxiety   . Diabetes ([place])   . GERD (gastroesophageal reflux disease)   . HBP (high blood pressure)   . High cholesterol   . Renal cyst, right   . Sleep  apnea    CPAP  . Swelling     Past Surgical History:  Procedure Laterality Date  . BLADDER REPAIR    . COLONOSCOPY WITH PROPOFOL N/A 04/07/2015   Procedure: COLONOSCOPY WITH PROPOFOL;  Surgeon: Lucilla Lame, MD;  Location: [place];  Service: Endoscopy;  Laterality: N/A;  Diabetic - oral meds CPAP  . HAND SURGERY     4TH AND 5TH FINGER  . I&D KNEE WITH POLY EXCHANGE Right 09/18/2018   Procedure: IRRIGATION AND DEBRIDEMENT KNEE WITH POLY EXCHANGE;  Surgeon: Lovell Sheehan, MD;  Location: [facility];  Service: Orthopedics;  Laterality: Right;  . KNEE ARTHROSCOPY Right   . 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 TOTAL KNEE ARTHROPLASTY Left 09/17/2018   Procedure: I &D left knee and polyethylene exchange;  Surgeon: Lovell Sheehan, MD;  Location: [facility];  Service: Orthopedics;  Laterality: Left;  Marland Kitchen VEIN SURGERY Right    Family History:  Family History  Problem Relation Age of Onset  . Hypertension Mother   .  Diabetes Mother   . Diabetes Father   . Hypertension Father   . Cancer Father   . Seizures Daughter   . Lupus Daughter   . Cancer Maternal Grandfather        Prostate  . Diabetes Paternal Grandfather   . Prostate cancer Maternal Uncle   . Bladder Cancer Neg Hx   . Kidney cancer Neg Hx    Family Psychiatric  History: Denies  Social History:  Social History   Substance and Sexual Activity  Alcohol Use Yes  . Alcohol/week: 10.0 standard drinks  . Types: 10 Cans of beer per week   Comment: occasional     Social History   Substance and Sexual Activity  Drug Use No    Social History   Socioeconomic History  . Marital status: Married    Spouse name: Not on file  . Number of children: 2  . Years of education: Not on file  . Highest education level: Bachelor's degree (e.g., BA, AB, BS)  Occupational History  . Occupation: case Freight forwarder   Social Needs  . Financial resource strain: Not hard at all  . Food insecurity    Worry: Never true     Inability: Never true  . Transportation needs    Medical: No    Non-medical: No  Tobacco Use  . Smoking status: Former Smoker    Packs/day: 1.00    Years: 10.00    Pack years: 10.00    Types: Cigarettes    Start date: 04/20/1995    Quit date: 04/19/2005    Years since quitting: 13.4  . Smokeless tobacco: Never Used  Substance and Sexual Activity  . Alcohol use: Yes    Alcohol/week: 10.0 standard drinks    Types: 10 Cans of beer per week    Comment: occasional  . Drug use: No  . Sexual activity: Yes    Partners: Female  Lifestyle  . Physical activity    Days per week: 0 days    Minutes per session: 0 min  . Stress: Not at all  Relationships  . Social connections    Talks on phone: More than three times a week    Gets together: Once a week    Attends religious service: Never    Active member of club or organization: No    Attends meetings of clubs or organizations: Never    Relationship status: Married  Other Topics Concern  . Not on file  Social History Narrative   Lives at home with wife, using a walker since his surgery recently.   Additional Social History:  Patient is married to his current wife, [name] of 23 years.  He has 2 children ages 49 and 37 from his previous wife. 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    Allergies:   Allergies  Allergen Reactions  . 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 Lipitor [Atorvastatin] Hives and Itching  . Other   . Viagra  [Sildenafil Citrate]     vision loss    Labs:  Results for orders placed or performed during the hospital encounter of 09/12/18 (from the past 48 hour(s))  Glucose, capillary     Status: Abnormal   Collection Time: 10/06/18 12:27 PM  Result Value Ref Range   Glucose-Capillary 117 (H) 70 - 99 mg/dL   Comment 1 Notify RN   Glucose,  capillary     Status: Abnormal   Collection Time: 10/06/18   5:17 PM  Result Value Ref Range   Glucose-Capillary 107 (H) 70 - 99 mg/dL   Comment 1 Notify RN   Glucose, capillary     Status: Abnormal   Collection Time: 10/06/18  9:03 PM  Result Value Ref Range   Glucose-Capillary 139 (H) 70 - 99 mg/dL  Protime-INR     Status: Abnormal   Collection Time: 10/07/18  4:28 AM  Result Value Ref Range   Prothrombin Time 24.4 (H) 11.4 - 15.2 seconds   INR 2.2 (H) 0.8 - 1.2    Comment: (NOTE) INR goal varies based on device and disease states. Performed at [facility], [address] [place], [place] [address]   Basic metabolic panel     Status: Abnormal   Collection Time: 10/07/18  4:28 AM  Result Value Ref Range   Sodium 137 135 - 145 mmol/L   Potassium 3.4 (L) 3.5 - 5.1 mmol/L   Chloride 104 98 - 111 mmol/L   CO2 19 (L) 22 - 32 mmol/L   Glucose, Bld 153 (H) 70 - 99 mg/dL   BUN 28 (H) 6 - 20 mg/dL   Creatinine, Ser 3.86 (H) 0.61 - 1.24 mg/dL   Calcium 8.4 (L) 8.9 - 10.3 mg/dL   GFR calc non Af Amer 16 (L) >60 mL/min   GFR calc Af Amer 18 (L) >60 mL/min   Anion gap 14 5 - 15    Comment: Performed at [facility], [address] [place], [place] [address]  Glucose, capillary     Status: Abnormal   Collection Time: 10/07/18  7:47 AM  Result Value Ref Range   Glucose-Capillary 135 (H) 70 - 99 mg/dL  Glucose, capillary     Status: Abnormal   Collection Time: 10/07/18 11:59 AM  Result Value Ref Range   Glucose-Capillary 180 (H) 70 - 99 mg/dL  Glucose, capillary     Status: Abnormal   Collection Time: 10/07/18  4:59 PM  Result Value Ref Range   Glucose-Capillary 130 (H) 70 - 99 mg/dL  Glucose, capillary     Status: Abnormal   Collection Time: 10/07/18  9:09 PM  Result Value Ref Range   Glucose-Capillary 114 (H) 70 - 99 mg/dL  Protime-INR     Status: Abnormal   Collection Time: 10/08/18  5:47 AM  Result Value Ref Range   Prothrombin Time 25.1 (H) 11.4 - 15.2 seconds   INR 2.3 (H) 0.8 - 1.2    Comment: (NOTE) INR  goal varies based on device and disease states. Performed at [facility], [address] [place], [place] [address]   CK     Status: None   Collection Time: 10/08/18  5:47 AM  Result Value Ref Range   Total CK 86 49 - 397 U/L    Comment: Performed at [facility], [address] [place],  [address]  Basic metabolic panel     Status: Abnormal   Collection Time: 10/08/18  5:47 AM  Result Value Ref Range   Sodium 137 135 - 145 mmol/L   Potassium 3.4 (L) 3.5 - 5.1 mmol/L   Chloride 105 98 - 111 mmol/L   CO2 22 22 - 32 mmol/L   Glucose, Bld 159 (H) 70 - 99 mg/dL   BUN 26 (H) 6 - 20 mg/dL   Creatinine, Ser 2.85 (H) 0.61 - 1.24 mg/dL   Calcium 8.3 (L)  8.9 - 10.3 mg/dL   GFR calc non Af Amer 23 (L) >60 mL/min   GFR calc Af Amer 27 (L) >60 mL/min   Anion gap 10 5 - 15    Comment: Performed at [facility], [place], [place] [address]  Glucose, capillary     Status: Abnormal   Collection Time: 10/08/18  7:46 AM  Result Value Ref Range   Glucose-Capillary 136 (H) 70 - 99 mg/dL   Comment 1 Notify RN     Current Facility-Administered Medications  Medication Dose Route Frequency Provider Last Rate Last Dose  . 0.9 %  sodium chloride infusion   Intravenous PRN Flora Lipps, MD 5 mL/hr at 09/29/18 0242    . 0.9 %  sodium chloride infusion  250 mL Intravenous PRN Flora Lipps, MD 0 mL/hr at 09/30/18 1534    . acetaminophen (TYLENOL) tablet 650 mg  650 mg Oral Q6H PRN Lovell Sheehan, MD   650 mg at 10/05/18 1613   Or  . acetaminophen (TYLENOL) suppository 650 mg  650 mg Rectal Q6H PRN Lovell Sheehan, MD   650 mg at 09/20/18 0444  . alum & mag hydroxide-simeth (MAALOX/MYLANTA) 200-200-20 MG/5ML suspension 30 mL  30 mL Oral Q4H PRN Fritzi Mandes, MD      . anticoagulant sodium citrate solution 5 mL  5 mL Intravenous Q dialysis Lateef, Mun[place], MD   5 mL at 09/29/18 1837  . aspirin EC tablet 81 mg  81 mg Oral Daily Lovell Sheehan, MD   81 mg  at 10/08/18 2119  . Chlorhexidine Gluconate Cloth 2 % PADS 6 each  6 each Topical Q0600 Lovell Sheehan, MD   6 each at 10/08/18 0840  . citalopram (CELEXA) tablet 10 mg  10 mg Oral Daily Patrecia Pour, NP   10 mg at 10/08/18 0839  . DAPTOmycin (CUBICIN) 700 mg in sodium chloride 0.9 % IVPB  700 mg Intravenous Q2000 Charlett Nose, RPH      . feeding supplement (NEPRO CARB STEADY) liquid 237 mL  237 mL Oral BID BM Flora Lipps, MD 237 mL/hr at 09/25/18 0951 237 mL at 10/08/18 0839  . fludrocortisone (FLORINEF) tablet 0.1 mg  0.1 mg Oral Daily Hillary Bow, MD   0.1 mg at 10/08/18 0839  . fluticasone furoate-vilanterol (BREO ELLIPTA) 100-25 MCG/INH 1 puff  1 puff Inhalation Daily Fritzi Mandes, MD   1 puff at 10/08/18 0840  . folic acid (FOLVITE) tablet 1 mg  1 mg Oral Daily Fritzi Mandes, MD   1 mg at 10/08/18 0839  . HYDROcodone-acetaminophen (NORCO/VICODIN) 5-325 MG per tablet 1-2 tablet  1-2 tablet Oral Q4H PRN Lovell Sheehan, MD   2 tablet at 09/19/18 0450  . hydrocortisone (CORTEF) tablet 20 mg  20 mg Oral BID Hillary Bow, MD   20 mg at 10/08/18 0839  . insulin aspart (novoLOG) injection 0-5 Units  0-5 Units Subcutaneous QHS Fritzi Mandes, MD      . insulin aspart (novoLOG) injection 0-9 Units  0-9 Units Subcutaneous TID WC Fritzi Mandes, MD   1 Units at 10/08/18 0840  . iohexol (OMNIPAQUE) 240 MG/ML injection 50 mL  50 mL Oral Once PRN Awilda Bill, NP      . ipratropium-albuterol (DUONEB) 0.5-2.5 (3) MG/3ML nebulizer solution 3 mL  3 mL Nebulization Q6H PRN Flora Lipps, MD   3 mL at 09/25/18 0721  . levofloxacin (LEVAQUIN) IVPB 750 mg  750 mg Intravenous Q48H  Tsosie Billing, MD   Stopped at 10/06/18 1929  . MEDLINE mouth rinse  15 mL Mouth Rinse BID Flora Lipps, MD   15 mL at 10/08/18 0840  . menthol-cetylpyridinium (CEPACOL) lozenge 3 mg  1 lozenge Oral PRN Lovell Sheehan, MD       Or  . phenol (CHLORASEPTIC) mouth spray 1 spray  1 spray Mouth/Throat PRN Lovell Sheehan,  MD      . multivitamin with minerals tablet 1 tablet  1 tablet Oral Daily Fritzi Mandes, MD   1 tablet at 10/08/18 0840  . omega-3 acid ethyl esters (LOVAZA) capsule 1 g  1 g Oral Daily Fritzi Mandes, MD   1 g at 10/08/18 0841  . ondansetron (ZOFRAN) tablet 4 mg  4 mg Oral Q6H PRN Lovell Sheehan, MD       Or  . ondansetron [facility]) injection 4 mg  4 mg Intravenous Q6H PRN Lovell Sheehan, MD      . sodium bicarbonate tablet 650 mg  650 mg Oral BID Loney Hering D, MD   650 mg at 10/08/18 0839  . sodium chloride flush (NS) 0.9 % injection 3 mL  3 mL Intravenous Q12H Flora Lipps, MD   3 mL at 10/07/18 2141  . sodium chloride flush (NS) 0.9 % injection 3 mL  3 mL Intravenous PRN Flora Lipps, MD   3 mL at 09/24/18 0805  . Warfarin - Pharmacist Dosing Inpatient   Does not apply [id number] Fritzi Mandes, MD        Musculoskeletal: Strength & Muscle Tone: decreased Gait & Station: unsteady Patient leans: N/A  Psychiatric Specialty Exam: Physical Exam  Nursing note and vitals reviewed. Constitutional: He is oriented to person, place, and time. He appears well-developed and well-nourished. No distress.  HENT:  Head: Normocephalic and atraumatic.  Eyes: EOM are normal.  Neck: Normal range of motion.  Cardiovascular: Normal rate and regular rhythm.  Respiratory: Effort normal. No respiratory distress.  Musculoskeletal: Normal range of motion.  Neurological: He is alert and oriented to person, place, and time.  Psychiatric: He has a normal mood and affect. His speech is normal and behavior is normal. Judgment and thought content normal. Cognition and memory are normal.    Review of Systems  Constitutional: Positive for malaise/fatigue.  Respiratory: Negative.   Cardiovascular: Negative.   Gastrointestinal: Negative.   Musculoskeletal: Negative.   Neurological: Negative.   Psychiatric/Behavioral: Positive for memory loss.    Blood pressure 140/87, pulse 95, temperature (!) 97.5 F (36.4 C),  temperature source Oral, resp. rate 18, height 6\' 4"  (1.93 m), weight (!) 156 kg, SpO2 99 %.Body mass index is 41.86 kg/m.  General Appearance: Casual and Neat  Eye Contact:  Good  Speech:  Clear and Coherent and Normal Rate  Volume:  Normal  Mood:  Euthymic  Affect:  Appropriate  Thought Process:  Coherent, Linear and Descriptions of Associations: Intact  Orientation:  Full (Time, Place, and Person)  Thought Content:  Logical and Hallucinations: None  Suicidal Thoughts:  No  Homicidal Thoughts:  No  Memory:  Good  Judgement:  Fair  Insight:  Fair  Psychomotor Activity:  Decreased  Concentration:  Concentration: Good and Attention Span: Good  Recall:  [place] of Knowledge:  Good  Language:  Good  Akathisia:  No  Handed:  Right  AIMS (if indicated):     Assets:  Communication Skills Desire for Improvement Financial Resources/Insurance Housing Intimacy Social Support Talents/Skills Vocational/Educational  ADL's:  Impaired  Cognition:  WNL  Sleep:   Patient has obstructive sleep apnea, uses BiPAP at night     Treatment Plan Summary: Daily contact with patient to assess and evaluate symptoms and progress in treatment and Medication management  Depression and anxiety Continue Celexa at 10 mg daily for anxiety and depression.  Patient likely has ongoing delirium which presents as fluctuating cognition.  At time of this assessment patient has full capacity.  The patient's exam is notable for altered sensorium, perceptual disturbances, disorientation and cognitive deficits that appear markedly different than their baseline, suggesting a diagnosis of delirium.  Virtually any medical condition or physiologic stress can precipitate delirium in a susceptible individual, with risk increasing in those with: advanced age, sensory impairments, organic brain disease (stroke, dementia, Parkinsons), psychiatric illness, major chronic medical issues, prolonged hospitalizations,  postoperative status, anemia, insomnia/disturbed sleep, and severe pain. Addressing the underlying medical condition and institution of preventative measures are recommended.  - Continue to monitor and treat underlying medical causes of delirium, including infection, electrolyte disturbances, etc. - Delirium precautions - Minimize/avoid deliriogenic meds including: anticholinergic, opiates, benzodiazepines           - Maintain hydration, oxygenation, nutrition           - Limit use of restraints and catheters           - Normalize sleep patterns by minimizing nighttime noise, light and interruptions by     -Ensure sleep apnea treatment is provided overnight.             clustering care, opening blinds during the day           - Reorient the patient frequently, provide easily visible clock and calendar           - Provide sensory aids like glasses, hearing aids           - Encourage ambulation, regular activities and visitors to maintain cognitive stimulation   -Patient would benefit from having family members at bedside to reinforce his orientation.  Disposition: No evidence of imminent risk to self or others at present.   Patient does not meet criteria for psychiatric inpatient admission. Supportive therapy provided about ongoing stressors.  Waylan Boga, NP 10/08/2018 11:00 AM

## 2018-10-08 NOTE — Progress Notes (Signed)
Physical Therapy Treatment Patient Details Name: Isaac Cross MRN: [id number] DOB: 1960/08/10 Today's Date: 10/08/2018    History of Present Illness Pt is a 58 y.o. male presenting to hospital 09/12/18 after rolling OOB landing on L knee; pt with increased dizziness x2 days, fevers x3 days, and generalized weakness.  S/p B TKR 09/01/18 in [place] (pt reports discharging home same day).  Pt admitted with sepsis, acute renal failure, and B TKR.  Pt s/p L knee 5/31 and R knee 6/1 I&D with poly exchange.  Pt noted with severe encephalopathy from DT's with extensive ETOH abuse.  L LE DVT noted.  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  HD started 6/5 with L IJ temporary HD catheter.  PMH includes B TKR 09/01/18 in [place], DM, asthma, R vein surgery, COPD. Right IJ line removed 6/16. 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    PT Comments    Participated in exercises as described below.  In and out of bed with rail, increased time and supervision.  He was able to stand today from bed and recliner with min guard of 2 for safety.  While orthostatic BP's noted, he was asymptomatic and it did not hinder therapy sessions.  Assisted pt back to bed after lunch per nursing request.  Pt out of bed 2.5 hours.  BP supine 141/94 P 104 BP sitting 119/78 P 112 Standing 113/88 P 135  Pt overall tolerated activity today with increased confidence and overall decreased assist.  Anticipated pt would have been able to walk this session but increased staff assistance was not available for pt and staff safety.  Anticipate progression to gait during tomorrows session.   Follow Up Recommendations  CIR     Equipment Recommendations  3in1 (PT);Other (comment)    Recommendations for Other Services       Precautions / Restrictions Precautions Precautions: Fall Restrictions Weight Bearing Restrictions: No RLE Weight Bearing: Weight bearing as tolerated LLE Weight Bearing: Weight bearing  as tolerated    Mobility  Bed Mobility Overal bed mobility: Needs Assistance Bed Mobility: Sit to Supine Rolling: Supervision   Supine to sit: Supervision     General bed mobility comments: increased time and use of rails  Transfers Overall transfer level: Needs assistance Equipment used: Rolling walker (2 wheeled) Transfers: Sit to/from Stand Sit to Stand: Min guard;+2 safety/equipment            Ambulation/Gait Ambulation/Gait assistance: Min guard;+2 safety/equipment Gait Distance (Feet): 2 Feet Assistive device: Rolling walker (2 wheeled)       General Gait Details: no c/o dizziness today   Stairs             Wheelchair Mobility    Modified Rankin (Stroke Patients Only)       Balance Overall balance assessment: Needs assistance Sitting-balance support: Feet supported Sitting balance-Leahy Scale: Good     Standing balance support: Bilateral upper extremity supported;During functional activity Standing balance-Leahy Scale: Fair Standing balance comment: reliant opn BUE external support                            Cognition Arousal/Alertness: Awake/alert Behavior During Therapy: WFL for tasks assessed/performed Overall Cognitive Status: Impaired/Different from baseline  Exercises Other Exercises Other Exercises: BLE HEP - anklle pumps, heel slides, ab/add, SLR, SAQ, LAQ, marches and pillow squeeze 2 x 10.    General Comments        Pertinent Vitals/Pain Pain Assessment: No/denies pain    Home Living                      Prior Function            PT Goals (current goals can now be found in the care plan section) Progress towards PT goals: Progressing toward goals    Frequency    7X/week      PT Plan Current plan remains appropriate    Co-evaluation              AM-PAC PT "6 Clicks" Mobility   Outcome Measure  Help needed turning from your  back to your side while in a flat bed without using bedrails?: A Little Help needed moving from lying on your back to sitting on the side of a flat bed without using bedrails?: A Little Help needed moving to and from a bed to a chair (including a wheelchair)?: A Little Help needed standing up from a chair using your arms (e.g., wheelchair or bedside chair)?: A Little Help needed to walk in hospital room?: A Little Help needed climbing 3-5 steps with a railing? : A Lot 6 Click Score: 17    End of Session Equipment Utilized During Treatment: Gait belt Activity Tolerance: Patient tolerated treatment well Patient left: in bed;with call bell/phone within reach;with bed alarm set Nurse Communication: Mobility status Pain - part of body: Knee     Time: 3151-7616 PT Time Calculation (min) (ACUTE ONLY): 38 min  Charges:  $Therapeutic Exercise: 23-37 mins $Therapeutic Activity: 8-22 mins                     Chesley Noon, PTA 10/08/18, 1:41 PM

## 2018-10-08 NOTE — Plan of Care (Signed)
  Problem: Education: Goal: Knowledge of General Education information will improve Description: Including pain rating scale, medication(s)/side effects and non-pharmacologic comfort measures Outcome: Progressing   Problem: Clinical Measurements: Goal: Ability to maintain clinical measurements within normal limits will improve Outcome: Progressing Goal: Will remain free from infection Outcome: Not Progressing Goal: Diagnostic test results will improve Outcome: Not Progressing Goal: Respiratory complications will improve Outcome: Progressing Goal: Cardiovascular complication will be avoided Outcome: Progressing   Problem: Activity: Goal: Risk for activity intolerance will decrease Outcome: Progressing   Problem: Nutrition: Goal: Adequate nutrition will be maintained Outcome: Progressing   Problem: Coping: Goal: Level of anxiety will decrease Outcome: Progressing   Problem: Elimination: Goal: Will not experience complications related to bowel motility Outcome: Progressing Goal: Will not experience complications related to urinary retention Outcome: Progressing   Problem: Pain Managment: Goal: General experience of comfort will improve Outcome: Progressing   Problem: Safety: Goal: Ability to remain free from injury will improve Outcome: Progressing   Problem: Skin Integrity: Goal: Risk for impaired skin integrity will decrease Outcome: Progressing   Problem: Education: Goal: Knowledge of disease or condition will improve Outcome: Progressing Goal: Understanding of discharge needs will improve Outcome: Progressing   Problem: Health Behavior/Discharge Planning: Goal: Ability to identify changes in lifestyle to reduce recurrence of condition will improve Outcome: Progressing Goal: Identification of resources available to assist in meeting health care needs will improve Outcome: Progressing   Problem: Physical Regulation: Goal: Complications related to the disease  process, condition or treatment will be avoided or minimized Outcome: Progressing

## 2018-10-08 NOTE — Progress Notes (Addendum)
[facility] NAME: Isaac Cross    MR#:  [id number]  DATE OF BIRTH:  1961-03-14  SUBJECTIVE:  Patient without complaint, physical therapy continues to be quite challenging-they are recommending SNF status post discharge, however patient wants to go home, currently unable to be discharged due to SNF unwilling to pay for expensive antibiotics, discussed with case management/social work-currently trying to assess how much expense will be on an outpatient basis if this can be done at home, currently unable to be discharged due to severe orthostatic hypotension/poor mobility, nephrology/ID input greatly appreciated  REVIEW OF SYSTEMS:  Review of Systems  Constitutional: Positive for malaise/fatigue. Negative for chills, fever and weight loss.  HENT: Negative for ear discharge, ear pain and nosebleeds.   Eyes: Negative for blurred vision, pain and discharge.  Respiratory: Negative for sputum production, shortness of breath, wheezing and stridor.   Cardiovascular: Negative for chest pain, palpitations, orthopnea and PND.  Gastrointestinal: Negative for abdominal pain, diarrhea, nausea and vomiting.  Genitourinary: Negative for frequency and urgency.  Musculoskeletal: Negative for back pain and joint pain.  Neurological: Negative for sensory change, speech change, focal weakness and weakness.  Psychiatric/Behavioral: Negative for depression and hallucinations. The patient is not nervous/anxious.    DRUG ALLERGIES:   Allergies  Allergen Reactions  . 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 Lipitor [Atorvastatin] Hives and Itching  . Other   . Viagra  [Sildenafil Citrate]     vision loss    VITALS:  Blood pressure 140/87, pulse 95, temperature (!) 97.5 F (36.4 C), temperature source Oral, resp. rate 18, height 6\' 4"  (1.93 m), weight (!) 156 kg, SpO2 99 %.  PHYSICAL EXAMINATION:  Physical Exam   GENERAL:   58 y.o.-year-old patient lying in the bed with no acute distress. Critically ill appearing EYES: Pupils equal, round, reactive to light and accommodation. No scleral icterus.  HEENT: Head atraumatic, normocephalic. Oropharynx and nasopharynx clear.  NECK:  Supple, no jugular venous distention. No thyroid enlargement, no tenderness.  LUNGS: Normal breath sounds bilaterally, no wheezing, rales,rhonchi or crepitation. No use of accessory muscles of respiration.  Decreased bibasilar breath sounds CARDIOVASCULAR: S1, S2 normal. No murmurs, rubs, or gallops. ABDOMEN: Soft, nontender, nondistended. Bowel sounds present. No organomegaly or mass. EXTREMITIES: Swelling of both legs, bilateral knee dressing.  No  cyanosis, or clubbing.  NEUROLOGIC: Cranial nerves II through XII are intact. Moving all extremities well PSYCHIATRIC: patient is alert and oriented x3 SKIN: No obvious rash, lesion, or ulcer.   LABORATORY PANEL:   CBC Recent Labs  Lab 10/05/18 0506  WBC 8.3  HGB 7.8*  HCT 24.3*  PLT 354   ------------------------------------------------------------------------------------------------------------------  Chemistries  Recent Labs  Lab 10/02/18 0506  10/08/18 0547  NA 137   < > 137  K 3.4*   < > 3.4*  CL 99   < > 105  CO2 24   < > 22  GLUCOSE 106*   < > 159*  BUN 36*   < > 26*  CREATININE 8.63*   < > 2.85*  CALCIUM 8.3*   < > 8.3*  MG 1.9  --   --    < > = values in this interval not displayed.   ------------------------------------------------------------------------------------------------------------------  Cardiac Enzymes No results for input(s): TROPONINI in the last 168 hours. ------------------------------------------------------------------------------------------------------------------  RADIOLOGY:  Mr Abdomen Wo Contrast  Result Date: 10/06/2018 CLINICAL DATA:  Adrenal masses.  EXAM: MRI ABDOMEN WITHOUT CONTRAST TECHNIQUE: Multiplanar multisequence MR imaging was  performed without the administration of intravenous contrast. COMPARISON:  10/04/2018 and 04/30/2015 FINDINGS: Lower chest: No acute findings. Hepatobiliary: No masses visualized on this unenhanced exam. Gallbladder is unremarkable. No evidence of biliary ductal dilatation. Pancreas: No mass or inflammatory process visualized on this unenhanced exam. Spleen:  Within normal limits in size. Adrenals/Urinary tract: 5.1 x 2.5 cm left adrenal mass is seen which shows T2 hyperintensity, and a peripheral rim of T1 hyperintensity and T2 hypointensity. This shows no evidence of signal dropout on chemical shift imaging, and has nonspecific characteristics. This is new compared to 2017 exam, and differential diagnosis includes nodular hyperplasia, subacute adrenal hematomas, lipid poor adenomas, and metastases. Masslike thickening in the right adrenal gland measures 3.8 x 2.1 cm. This lesion shows T1 and T2 hyperintensity and is also new since 2017 exam. No signal dropout is seen on chemical shift imaging. These are nonspecific characteristics. Fluid attenuation renal cysts are noted bilaterally. No evidence of renal mass or hydronephrosis. Stomach/Bowel: Visualized portion unremarkable. Vascular/Lymphatic: No pathologically enlarged lymph nodes identified. No evidence of abdominal aortic aneurysm. Other:  None. Musculoskeletal:  No suspicious bone lesions identified. IMPRESSION: Bilateral adrenal mass, both new since 2017 but with nonspecific imaging characteristics. Differential diagnosis includes nodular hyperplasia, subacute adrenal hematomas, lipid poor adenomas, and metastatic disease. In patient without history of cancer, consider biochemical assays to determine functional status, as well as surgical resection. If patient has cancer history, consider biopsy or PET-CT. This recommendation follows ACR consensus guidelines: Management of Incidental Adrenal Masses: A White Paper of the ACR Incidental Findings Committee. J  Am Coll Radiol 2017;14:1038-1044. Electronically Signed   By: Marlaine Hind M.D.   On: 10/06/2018 15:04    EKG:   Orders placed or performed during the hospital encounter of 09/12/18  . EKG 12-Lead  . EKG 12-Lead  . EKG 12-Lead  . EKG 12-Lead  . EKG 12-Lead  . EKG 12-Lead    ASSESSMENT AND PLAN:  EdwardGradyis a58 y.o.malewith a known history of arthritis, COPD not on home oxygen, hypertension, sleep apnea, non-insulin-dependent diabetes mellitus presents to hospital secondary to dizziness, weakness and a fall  * Sepsis Resolved Suspected due to seeding of bilateral knee replacements, s/p recent bilateral knee surgeries, s/p bilateral knee washouts done this admission Fluid Cultures neg - but patient was on antibiotics prior to his washout and sampling Fungal cultures negative - completed 7 days of anidulafungin  Continue daptomycin/Levaquin for 4-week course  - last dose 10/31/2018, SNF unable to take patient due to expensive medications, discussed with case management/social work to see if antibiotics can be done as outpatient  *Acute non - Oliguric renal failure Resolving slowly-creatinine 2.8 down from 3.8 likely ATN from sepsis/hypotension Nephrology input greatly appreciated, s/p hemodialysis 09/22/2018, continue to hold lisinopril/metformin, avoid nephrotoxic agents, strict I&O monitoring, daily weights, BMP daily  * Acute encephalopathy Resolved secondary to metabolic causes, sepsis and uremia S/P delirium tremens for period of time which was treated in the ICU  MRi Brain - Nothing acute Neurology/Dr. 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  *Primary adrenal insufficiency Low cortisol level.  High ACTH level.  No response to cosyntropin dose. 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  Continue Florinef and hydrocortisone  * Anemia of chronic disease Stable  *  Hypokalemia Replete   *Hypomagnesemia Repleted  DVT prophylaxis-on Coumadin  Disposition to: Inpatient rehab when bed is available, 1-3 days  CODE STATUS: Full code  TOTAL TIME TAKING CARE OF THIS PATIENT: 35 minutes.   Isaac Cross M.D on 10/08/2018 at 11:31 AM  Between 7am to 6pm - Pager - [phone]  After 6pm go to [email or web address] - password [email or web address] [facility]  Office  [phone]  CC: Primary care physician; Steele Sizer, MD

## 2018-10-08 NOTE — Progress Notes (Signed)
[facility]  ROUNDING NOTE   Subjective:   Sitting in chair. No complaints. UOP 2700  Objective:  Vital signs in last 24 hours:  Temp:  [97.5 F (36.4 C)-98.7 F (37.1 C)] 97.5 F (36.4 C) (06/21 0744) Pulse Rate:  [95-98] 95 (06/21 0744) Resp:  [17-18] 18 (06/21 0744) BP: (126-140)/(79-104) 140/87 (06/21 0744) SpO2:  [97 %-100 %] 99 % (06/21 0744)  Weight change:  Filed Weights   09/29/18 0630 09/29/18 1445 09/29/18 1823  Weight: (!) 157.1 kg (!) 157 kg (!) 156 kg    Intake/Output: I/O last 3 completed shifts: In: 609.1 [P.O.:480; IV Piggyback:129.1] Out: 3900 [Urine:3900]   Intake/Output this shift:  Total I/O In: 240 [P.O.:240] Out: 760 [Urine:760]  Physical Exam: General: NAD  Head: Moist oral mucosal membranes  Eyes: Anicteric   Lungs:  clear  Heart: regular  Abdomen:  Soft, nontender, obese   Extremities: trace peripheral edema  Neurologic: Awake, alert, conversant  Skin: warm        Basic Metabolic Panel: Recent Labs  Lab 10/02/18 0506  10/04/18 0542 10/05/18 0506 10/06/18 0611 10/07/18 0428 10/08/18 0547  NA 137   < > 138 137 135 137 137  K 3.4*   < > 3.4* 3.1* 3.6 3.4* 3.4*  CL 99   < > 102 103 101 104 105  CO2 24   < > 22 21* 25 19* 22  GLUCOSE 106*   < > 94 89 137* 153* 159*  BUN 36*   < > 37* 35* 31* 28* 26*  CREATININE 8.63*   < > 7.51* 6.63* 5.17* 3.86* 2.85*  CALCIUM 8.3*   < > 8.6* 8.3* 8.2* 8.4* 8.3*  MG 1.9  --   --   --   --   --   --    < > = values in this interval not displayed.    Liver Function Tests: No results for input(s): AST, ALT, ALKPHOS, BILITOT, PROT, ALBUMIN in the last 168 hours. No results for input(s): LIPASE, AMYLASE in the last 168 hours. No results for input(s): AMMONIA in the last 168 hours.  CBC: Recent Labs  Lab 10/02/18 0506 10/03/18 0302 10/04/18 0542 10/05/18 0506  WBC 10.3 10.4 10.2  10.4 8.3  NEUTROABS  --   --  5.7 4.3  HGB 8.7* 8.8* 8.2*  8.2* 7.8*  HCT 27.1* 27.8* 26.6*   25.9* 24.3*  MCV 81.6 82.0 85.0  82.5 82.1  PLT 503* 494* 472*  450* 354    Cardiac Enzymes: Recent Labs  Lab 10/02/18 0506 10/05/18 0506 10/08/18 0547  CKTOTAL 114 92 86    BNP: Invalid input(s): POCBNP  CBG: Recent Labs  Lab 10/07/18 1159 10/07/18 1659 10/07/18 2109 10/08/18 0746 10/08/18 1140  GLUCAP 180* 130* 114* 136* 164*    Microbiology: Results for orders placed or performed during the hospital encounter of 09/12/18  Blood Culture (routine x 2)     Status: None   Collection Time: 09/12/18  9:56 AM   Specimen: BLOOD LEFT HAND  Result Value Ref Range Status   Specimen Description BLOOD LEFT HAND  Final   Special Requests   Final    BOTTLES DRAWN AEROBIC AND ANAEROBIC Blood Culture adequate volume   Culture   Final    NO GROWTH 5 DAYS Performed at [facility], [address] [place], [place] [address]    Report Status 09/17/2018 FINAL  Final  Urine culture     Status: None  Collection Time: 09/12/18 10:01 AM   Specimen: Urine, Random  Result Value Ref Range Status   Specimen Description   Final    URINE, RANDOM Performed at [facility], [address] [place], [place] [address]    Special Requests   Final    NONE Performed at [facility], [address] [place], [place] [address]    Culture   Final    NO GROWTH Performed at [facility], [place] [address] [place], [place] [address]    Report Status 09/13/2018 FINAL  Final  SARS Coronavirus 2 (CEPHEID- Performed in [place] hospital lab), Hosp Order     Status: None   Collection Time: 09/12/18 10:02 AM   Specimen: Nasopharyngeal Swab  Result Value Ref Range Status   SARS Coronavirus 2 NEGATIVE NEGATIVE Final    Comment: (NOTE) If result is NEGATIVE SARS-CoV-2 target nucleic acids are NOT DETECTED. The SARS-CoV-2 RNA is generally detectable in upper and lower  respiratory specimens during the acute phase of infection. The lowest  concentration of  SARS-CoV-2 viral copies this assay can detect is 250  copies / mL. A negative result does not preclude SARS-CoV-2 infection  and should not be used as the sole basis for treatment or other  patient management decisions.  A negative result may occur with  improper specimen collection / handling, submission of specimen other  than nasopharyngeal swab, presence of viral mutation(s) within the  areas targeted by this assay, and inadequate number of viral copies  (<250 copies / mL). A negative result must be combined with clinical  observations, patient history, and epidemiological information. If result is POSITIVE SARS-CoV-2 target nucleic acids are DETECTED. The SARS-CoV-2 RNA is generally detectable in upper and lower  respiratory specimens dur ing the acute phase of infection.  Positive  results are indicative of active infection with SARS-CoV-2.  Clinical  correlation with patient history and other diagnostic information is  necessary to determine patient infection status.  Positive results do  not rule out bacterial infection or co-infection with other viruses. If result is PRESUMPTIVE POSTIVE SARS-CoV-2 nucleic acids MAY BE PRESENT.   A presumptive positive result was obtained on the submitted specimen  and confirmed on repeat testing.  While 2019 novel coronavirus  (SARS-CoV-2) nucleic acids may be present in the submitted sample  additional confirmatory testing may be necessary for epidemiological  and / or clinical management purposes  to differentiate between  SARS-CoV-2 and other Sarbecovirus currently known to infect humans.  If clinically indicated additional testing with an alternate test  methodology [phone]) is advised. The SARS-CoV-2 RNA is generally  detectable in upper and lower respiratory sp ecimens during the acute  phase of infection. The expected result is Negative. Fact Sheet for Patients:  StrictlyIdeas.no Fact Sheet for Healthcare  Providers: BankingDealers.co.za This test is not yet approved or cleared by the [place] FDA and has been authorized for detection and/or diagnosis of SARS-CoV-2 by FDA under an Emergency Use Authorization (EUA).  This EUA will remain in effect (meaning this test can be used) for the duration of the COVID-19 declaration under Section 564(b)(1) of the Act, 21 U.S.C. section 360bbb-3(b)(1), unless the authorization is terminated or revoked sooner. Performed at [facility], [address] [place], [place] [address]   Blood Culture (routine x 2)     Status: None   Collection Time: 09/12/18 10:06 AM   Specimen: BLOOD  Result Value Ref Range Status   Specimen  Description BLOOD LEFT ANTECUBITAL  Final   Special Requests   Final    BOTTLES DRAWN AEROBIC AND ANAEROBIC Blood Culture adequate volume   Culture   Final    NO GROWTH 5 DAYS Performed at [facility], [address] [place], [place] [address]    Report Status 09/17/2018 FINAL  Final  MRSA PCR Screening     Status: None   Collection Time: 09/12/18  7:04 PM   Specimen: Nasal Mucosa; Nasopharyngeal  Result Value Ref Range Status   MRSA by PCR NEGATIVE NEGATIVE Final    Comment:        The GeneXpert MRSA Assay (FDA approved for NASAL specimens only), is one component of a comprehensive MRSA colonization surveillance program. It is not intended to diagnose MRSA infection nor to guide or monitor treatment for MRSA infections. Performed at [facility], [address] [place], [place] [address]   C difficile quick scan w PCR reflex     Status: None   Collection Time: 09/14/18  2:21 PM   Specimen: STOOL  Result Value Ref Range Status   C Diff antigen NEGATIVE NEGATIVE Final   C Diff toxin NEGATIVE NEGATIVE Final   C Diff interpretation No C. difficile detected.  Final    Comment: Performed at [facility], [address] [place], [place] [address]  Group  A Strep by PCR     Status: None   Collection Time: 09/15/18  1:53 PM  Result Value Ref Range Status   Group A Strep by PCR NOT DETECTED NOT DETECTED Final    Comment: Performed at [facility], [address] [place], [place] [address]  Body fluid culture     Status: None   Collection Time: 09/15/18  5:13 PM   Specimen: KNEE  Result Value Ref Range Status   Specimen Description   Final    KNEE Performed at [facility], [address] [place], [place] [address]    Special Requests   Final    NONE Performed at [facility], [address], [place], [place] [address]    Gram Stain   Final    RARE RBCS FEW WBC SEEN NO ORGANISMS SEEN Performed at [facility], [address] [place], [place] [address]    Culture   Final    NO GROWTH 3 DAYS Performed at [facility], [place] [address], [place], [place] [address]    Report Status 09/19/2018 FINAL  Final  Body fluid culture     Status: None   Collection Time: 09/16/18  8:55 AM   Specimen: KNEE  Result Value Ref Range Status   Specimen Description KNEE RIGHT  Final   Special Requests NONE  Final   Gram Stain   Final    RARE WBC PRESENT, PREDOMINANTLY PMN NO ORGANISMS SEEN    Culture   Final    NO GROWTH 3 DAYS Performed at [facility], [place] [address] [place], [place] [address]    Report Status 09/20/2018 FINAL  Final  Aerobic/Anaerobic Culture (surgical/deep wound)     Status: None   Collection Time: 09/17/18 11:32 AM   Specimen: Wound  Result Value Ref Range Status   Specimen Description   Final    TISSUE LEFT KNEE 1 Performed at [facility], [address], [place],  [address]    Special Requests PATIENT ON FOLLOWING MAXIPIME VANCOMYCIN  Final   Gram Stain   Final  FEW WBC PRESENT,BOTH PMN AND MONONUCLEAR NO ORGANISMS SEEN    Culture   Final    No growth aerobically or anaerobically. Performed at [facility], [place] [address]  [place], [place] [address]    Report Status 09/22/2018 FINAL  Final  Acid Fast Smear (AFB)     Status: None   Collection Time: 09/17/18 11:32 AM   Specimen: Synovium  Result Value Ref Range Status   AFB Specimen Processing Comment  Final    Comment: Tissue Grinding and Digestion/Decontamination   Acid Fast Smear Negative  Final    Comment: (NOTE) Performed At: [facility] [place], [place] [id number] Rush Farmer MD [id number]    Source (AFB) TISSUE  Final    Comment: LEFT KNEE Performed at [facility], [place] [address], [place], [place] [address]   Culture, fungus without smear     Status: None (Preliminary result)   Collection Time: 09/17/18 11:34 AM   Specimen: Synovium; Other  Result Value Ref Range Status   Specimen Description   Final    SYNOVIAL Performed at [facility], [address] [place], [place] [address]    Special Requests   Final    NONE Performed at [facility], [address] [place], [place] [address]    Culture   Final    NO FUNGUS ISOLATED AFTER 14 DAYS Performed at [facility], [place] [address], [place], [place] [address]    Report Status PENDING  Incomplete  Acid Fast Culture with reflexed sensitivities     Status: None   Collection Time: 09/17/18 11:36 AM   Specimen: Synovium  Result Value Ref Range Status   Acid Fast Culture CANC  Final    Comment: (NOTE) Test cancelled at client's request.      Cancelled per Levin Erp 09/21/2018 Performed At: [facility] [place], [place] [id number] Rush Farmer MD GY:[id number]    Source of Sample TISSUE  Final    Comment: RIGHT KNEE Performed at [facility], [place] [address] [place], [place] [address]   Aerobic/Anaerobic Culture (surgical/deep wound)     Status: None   Collection Time: 09/17/18 11:43 AM   Specimen: Wound  Result Value Ref Range Status   Specimen Description   Final    TISSUE LEFT KNEE  2 Performed at [facility], [address] [place], [place] [address]    Special Requests PATIENT ON FOLLOWING MAXIPIME VANCOMYCIN  Final   Gram Stain   Final    FEW WBC PRESENT, PREDOMINANTLY PMN NO ORGANISMS SEEN    Culture   Final    No growth aerobically or anaerobically. Performed at [facility], [place] [address], [place], [place] [address]    Report Status 09/22/2018 FINAL  Final  Aerobic/Anaerobic Culture (surgical/deep wound)     Status: None   Collection Time: 09/17/18 11:44 AM   Specimen: Wound  Result Value Ref Range Status   Specimen Description   Final    TISSUE LEFT KNEE 3 Performed at [facility], [address] [place],  [address]    Special Requests PATIENT ON FOLLOWING MAXIPIME VANCOMYCIN  Final   Gram Stain   Final    MODERATE WBC PRESENT, PREDOMINANTLY PMN NO ORGANISMS SEEN    Culture   Final    No growth aerobically or anaerobically. Performed at [facility], [place] [address], [place],  [address]  Report Status 09/22/2018 FINAL  Final  Aerobic/Anaerobic Culture (surgical/deep wound)     Status: None   Collection Time: 09/18/18  9:43 PM   Specimen: [facility] Other; Tissue  Result Value Ref Range Status   Specimen Description   Final    SYNOVIAL Performed at [facility], [address] [place], [place] [address]    Special Requests   Final    NONE Performed at [facility], [address] [place], [place] [address]    Gram Stain   Final    RARE WBC PRESENT, PREDOMINANTLY MONONUCLEAR NO ORGANISMS SEEN    Culture   Final    No growth aerobically or anaerobically. Performed at [facility], [place] [address], [place], [place] [address]    Report Status 09/24/2018 FINAL  Final  Aerobic/Anaerobic Culture (surgical/deep wound)     Status: None   Collection Time: 09/18/18  9:44 PM   Specimen: [facility] Other; Tissue  Result Value Ref Range Status   Specimen Description   Final     SYNOVIAL Performed at [facility], [address], [place], [place] [address]    Special Requests   Final    RIGHT KNEE Performed at [facility], [place], [place] [address]    Gram Stain   Final    FEW WBC PRESENT,BOTH PMN AND MONONUCLEAR NO ORGANISMS SEEN    Culture   Final    No growth aerobically or anaerobically. Performed at [facility], [place] [address] [place], [place] [address]    Report Status 09/24/2018 FINAL  Final  Aerobic/Anaerobic Culture (surgical/deep wound)     Status: None   Collection Time: 09/18/18  9:45 PM   Specimen: [facility] Other; Tissue  Result Value Ref Range Status   Specimen Description   Final    SYNOVIAL Performed at [facility], [address], [place], [place] [address]    Special Requests   Final    RIGHT KNEE Performed at [facility], [address] [place], [place] [address]    Gram Stain   Final    FEW WBC PRESENT, PREDOMINANTLY MONONUCLEAR NO ORGANISMS SEEN    Culture   Final    No growth aerobically or anaerobically. Performed at [facility], [place] [address], [place], [place] [address]    Report Status 09/24/2018 FINAL  Final  Culture, blood (Routine X 2) w Reflex to ID Panel     Status: None   Collection Time: 09/19/18  1:11 PM   Specimen: BLOOD  Result Value Ref Range Status   Specimen Description BLOOD LEFT ANTECUBITAL  Final   Special Requests   Final    BOTTLES DRAWN AEROBIC AND ANAEROBIC Blood Culture results may not be optimal due to an excessive volume of blood received in culture bottles   Culture   Final    NO GROWTH 5 DAYS Performed at [facility], [address] [place], [place] [address]    Report Status 09/24/2018 FINAL  Final  Culture, blood (Routine X 2) w Reflex to ID Panel     Status: None   Collection Time: 09/19/18  1:20 PM   Specimen: BLOOD  Result Value Ref Range Status   Specimen Description BLOOD BLOOD RIGHT HAND  Final    Special Requests   Final    BOTTLES DRAWN AEROBIC AND ANAEROBIC Blood Culture results may not be optimal due to an excessive volume of blood received in  culture bottles   Culture   Final    NO GROWTH 5 DAYS Performed at [facility], [address] [place], [place] [address]    Report Status 09/24/2018 FINAL  Final  Urine Culture     Status: None   Collection Time: 09/20/18  3:59 PM   Specimen: Urine, Random  Result Value Ref Range Status   Specimen Description   Final    URINE, RANDOM Performed at [facility], [address], [place], [place] [address]    Special Requests   Final    NONE Performed at [facility], [address] [place], [place] [address]    Culture   Final    NO GROWTH Performed at [facility], [place] [address] [place], [place] [address]    Report Status 09/21/2018 FINAL  Final  C difficile quick scan w PCR reflex     Status: None   Collection Time: 09/21/18  3:27 PM   Specimen: STOOL  Result Value Ref Range Status   C Diff antigen NEGATIVE NEGATIVE Final   C Diff toxin NEGATIVE NEGATIVE Final   C Diff interpretation No C. difficile detected.  Final    Comment: Performed at [facility], [address] [place], [place] [address]  Gastrointestinal Panel by PCR , Stool     Status: None   Collection Time: 09/21/18  3:27 PM   Specimen: Stool  Result Value Ref Range Status   Campylobacter species NOT DETECTED NOT DETECTED Final   Plesimonas shigelloides NOT DETECTED NOT DETECTED Final   Salmonella species NOT DETECTED NOT DETECTED Final   Yersinia enterocolitica NOT DETECTED NOT DETECTED Final   Vibrio species NOT DETECTED NOT DETECTED Final   Vibrio cholerae NOT DETECTED NOT DETECTED Final   Enteroaggregative E coli (EAEC) NOT DETECTED NOT DETECTED Final   Enteropathogenic E coli (EPEC) NOT DETECTED NOT DETECTED Final   Enterotoxigenic E coli (ETEC) NOT DETECTED NOT DETECTED Final   Shiga like toxin  producing E coli (STEC) NOT DETECTED NOT DETECTED Final   Shigella/Enteroinvasive E coli (EIEC) NOT DETECTED NOT DETECTED Final   Cryptosporidium NOT DETECTED NOT DETECTED Final   Cyclospora cayetanensis NOT DETECTED NOT DETECTED Final   Entamoeba histolytica NOT DETECTED NOT DETECTED Final   Giardia lamblia NOT DETECTED NOT DETECTED Final   Adenovirus F40/41 NOT DETECTED NOT DETECTED Final   Astrovirus NOT DETECTED NOT DETECTED Final   Norovirus GI/GII NOT DETECTED NOT DETECTED Final   Rotavirus A NOT DETECTED NOT DETECTED Final   Sapovirus (I, II, IV, and V) NOT DETECTED NOT DETECTED Final    Comment: Performed at [facility], [address] [place],  [address]  Novel Coronavirus, NAA (hospital order; send-out to ref lab)     Status: None   Collection Time: 09/29/18  4:36 PM   Specimen: Nasopharyngeal Swab; Respiratory  Result Value Ref Range Status   SARS-CoV-2, NAA NOT DETECTED NOT DETECTED Final    Comment: (NOTE) This test was developed and its performance characteristics determined by [employer]. This test has not been FDA cleared or approved. This test has been authorized by FDA under an Emergency Use Authorization (EUA). This test is only authorized for the duration of time the declaration that circumstances exist justifying the authorization of the emergency use of in vitro diagnostic tests for detection of SARS-CoV-2 virus and/or diagnosis of COVID-19 infection under section 564(b)(1) of the Act, 21 U.S.C. 025ENI-7(P)(8), unless the authorization is terminated or  revoked sooner. When diagnostic testing is negative, the possibility of a false negative result should be considered in the context of a patient's recent exposures and the presence of clinical signs and symptoms consistent with COVID-19. An individual without symptoms of COVID-19 and who is not shedding SARS-CoV-2 virus would expect to have a negative (not detected) result in this  assay. Performed  At: [facility] [address] [place], [place] [id number] Rush Farmer MD [id number]    [place]  Final    Comment: Performed at [facility], [address] [place], [place] [address]  CULTURE, BLOOD (ROUTINE X 2) w Reflex to ID Panel     Status: None   Collection Time: 10/02/18  5:26 PM   Specimen: BLOOD  Result Value Ref Range Status   Specimen Description BLOOD BLOOD RIGHT FOREARM  Final   Special Requests   Final    BOTTLES DRAWN AEROBIC AND ANAEROBIC Blood Culture adequate volume   Culture   Final    NO GROWTH 5 DAYS Performed at [facility], [address] [place], [place] [address]    Report Status 10/07/2018 FINAL  Final  CULTURE, BLOOD (ROUTINE X 2) w Reflex to ID Panel     Status: None   Collection Time: 10/02/18  5:41 PM   Specimen: BLOOD  Result Value Ref Range Status   Specimen Description BLOOD BLOOD RIGHT HAND  Final   Special Requests   Final    BOTTLES DRAWN AEROBIC AND ANAEROBIC Blood Culture adequate volume   Culture   Final    NO GROWTH 5 DAYS Performed at [facility], [address] [place], [place] [address]    Report Status 10/07/2018 FINAL  Final    Coagulation Studies: Recent Labs    10/06/18 0611 10/07/18 0428 10/08/18 0547  LABPROT 23.4* 24.4* 25.1*  INR 2.1* 2.2* 2.3*    Urinalysis: No results for input(s): COLORURINE, LABSPEC, PHURINE, GLUCOSEU, HGBUR, BILIRUBINUR, KETONESUR, PROTEINUR, UROBILINOGEN, NITRITE, LEUKOCYTESUR in the last 72 hours.  Invalid input(s): APPERANCEUR    Imaging: Mr Abdomen Wo Contrast  Result Date: 10/06/2018 CLINICAL DATA:  Adrenal masses. EXAM: MRI ABDOMEN WITHOUT CONTRAST TECHNIQUE: Multiplanar multisequence MR imaging was performed without the administration of intravenous contrast. COMPARISON:  10/04/2018 and 04/30/2015 FINDINGS: Lower chest: No acute findings. Hepatobiliary: No masses visualized on this unenhanced  exam. Gallbladder is unremarkable. No evidence of biliary ductal dilatation. Pancreas: No mass or inflammatory process visualized on this unenhanced exam. Spleen:  Within normal limits in size. Adrenals/Urinary tract: 5.1 x 2.5 cm left adrenal mass is seen which shows T2 hyperintensity, and a peripheral rim of T1 hyperintensity and T2 hypointensity. This shows no evidence of signal dropout on chemical shift imaging, and has nonspecific characteristics. This is new compared to 2017 exam, and differential diagnosis includes nodular hyperplasia, subacute adrenal hematomas, lipid poor adenomas, and metastases. Masslike thickening in the right adrenal gland measures 3.8 x 2.1 cm. This lesion shows T1 and T2 hyperintensity and is also new since 2017 exam. No signal dropout is seen on chemical shift imaging. These are nonspecific characteristics. Fluid attenuation renal cysts are noted bilaterally. No evidence of renal mass or hydronephrosis. Stomach/Bowel: Visualized portion unremarkable. Vascular/Lymphatic: No pathologically enlarged lymph nodes identified. No evidence of abdominal aortic aneurysm. Other:  None. Musculoskeletal:  No suspicious bone lesions identified. IMPRESSION: Bilateral adrenal mass, both new since 2017 but with nonspecific imaging characteristics. Differential diagnosis includes nodular hyperplasia, subacute adrenal hematomas, lipid poor adenomas, and  metastatic disease. In patient without history of cancer, consider biochemical assays to determine functional status, as well as surgical resection. If patient has cancer history, consider biopsy or PET-CT. This recommendation follows ACR consensus guidelines: Management of Incidental Adrenal Masses: A White Paper of the ACR Incidental Findings Committee. J Am Coll Radiol 2017;14:1038-1044. Electronically Signed   By: Marlaine Hind M.D.   On: 10/06/2018 15:04     Medications:   . sodium chloride 5 mL/hr at 09/29/18 0242  . sodium chloride 0 mL/hr  at 09/30/18 1534  . anticoagulant sodium citrate    . DAPTOmycin (CUBICIN)  IV    . levofloxacin (LEVAQUIN) IV Stopped (10/06/18 1929)   . aspirin EC  81 mg Oral Daily  . Chlorhexidine Gluconate Cloth  6 each Topical Q0600  . citalopram  10 mg Oral Daily  . feeding supplement (NEPRO CARB STEADY)  237 mL Oral BID BM  . fludrocortisone  0.1 mg Oral Daily  . fluticasone furoate-vilanterol  1 puff Inhalation Daily  . folic acid  1 mg Oral Daily  . hydrocortisone  20 mg Oral BID  . insulin aspart  0-5 Units Subcutaneous QHS  . insulin aspart  0-9 Units Subcutaneous TID WC  . mouth rinse  15 mL Mouth Rinse BID  . multivitamin with minerals  1 tablet Oral Daily  . omega-3 acid ethyl esters  1 g Oral Daily  . sodium bicarbonate  650 mg Oral BID  . sodium chloride flush  3 mL Intravenous Q12H  . Warfarin - Pharmacist Dosing Inpatient   Does not apply q1800   sodium chloride, sodium chloride, acetaminophen **OR** acetaminophen, alum & mag hydroxide-simeth, anticoagulant sodium citrate, HYDROcodone-acetaminophen, iohexol, ipratropium-albuterol, menthol-cetylpyridinium **OR** phenol, ondansetron **OR** ondansetron (ZOFRAN) IV, sodium chloride flush  Assessment/ Plan:  Mr. PATSY VARMA is a 58 y.o. black male with hyeprtension, COPD, diabetes mellitus type II who underwent bilateral knee replacement surgeries on 5/15 at [facility]. who was admitted to [facility] on 09/12/2018 for septic arthritis. Hospital course with left knee surgery on 5/31 and right knee surgery on 6/1 by Dr. Harlow Mares.   1. Acute renal failure  baseline creatinine of 1.07 with normal GFR in 06/12/18.  Acute renal failure secondary to ATN. Required hemodialysis. First treatment was 6/9. Last hemodialysis treatment 6/12.  Continues to have improving renal function and nonoliguric urine output.  Dialysis access has been removed. No indication for further dialysis.   2. Septic shock: with septic arthritis. Afebrile last 24 hours -  ID recommends 4 weeks of ceftazidime and daptomycin (6mg /kg) until 10/31/2018  3. Hypotension and Hypokalemia: with concern of adrenal insufficiency.  141/86 MRI with bilateral masses - Will need TTKG as an outpatient - fludrocortisone and hydrocortisone - MRI with gadolinium when renal function back to baseline.   4. Anemia with renal failure: hemoglobin 7.8. EPO subcu 10000 units x 1 6/18   LOS: 26 Antwaine Boomhower 6/21/20201:03 PM

## 2018-10-09 ENCOUNTER — Telehealth: Payer: Self-pay

## 2018-10-09 LAB — BASIC METABOLIC PANEL
Anion gap: 10 (ref 5–15)
BUN: 24 mg/dL — ABNORMAL HIGH (ref 6–20)
CO2: 23 mmol/L (ref 22–32)
Calcium: 8.4 mg/dL — ABNORMAL LOW (ref 8.9–10.3)
Chloride: 107 mmol/L (ref 98–111)
Creatinine, Ser: 2.42 mg/dL — ABNORMAL HIGH (ref 0.61–1.24)
GFR calc Af Amer: 32 mL/min — ABNORMAL LOW (ref >60)
GFR calc non Af Amer: 28 mL/min — ABNORMAL LOW (ref >60)
Glucose, Bld: 163 mg/dL — ABNORMAL HIGH (ref 70–99)
Potassium: 3.7 mmol/L (ref 3.5–5.1)
Sodium: 140 mmol/L (ref 135–145)

## 2018-10-09 LAB — GLUCOSE, CAPILLARY
Glucose-Capillary: 122 mg/dL — ABNORMAL HIGH (ref 70–99)
Glucose-Capillary: 112 mg/dL — ABNORMAL HIGH (ref 70–99)
Glucose-Capillary: 196 mg/dL — ABNORMAL HIGH (ref 70–99)
Glucose-Capillary: 85 mg/dL (ref 70–99)

## 2018-10-09 LAB — PROTIME-INR
INR: 2.3 — ABNORMAL HIGH (ref 0.8–1.2)
Prothrombin Time: 24.8 seconds — ABNORMAL HIGH (ref 11.4–15.2)

## 2018-10-09 MED ORDER — LEVOFLOXACIN 500 MG PO TABS
500.0000 mg | ORAL_TABLET | Freq: Every day | ORAL | Status: DC
  Administered 2018-10-09 – 2018-10-12 (×4): 500 mg via ORAL
  Filled 2018-10-09 (×4): qty 1

## 2018-10-09 MED ORDER — SODIUM CHLORIDE 0.9 % IV SOLN
700.0000 mg | Freq: Every day | INTRAVENOUS | Status: DC
  Filled 2018-10-09: qty 14

## 2018-10-09 MED ORDER — SODIUM CHLORIDE 0.9 % IV SOLN
700.0000 mg | Freq: Every day | INTRAVENOUS | Status: DC
  Administered 2018-10-10 – 2018-10-11 (×3): 700 mg via INTRAVENOUS
  Filled 2018-10-09 (×4): qty 14

## 2018-10-09 MED ORDER — WARFARIN SODIUM 6 MG PO TABS
6.0000 mg | ORAL_TABLET | Freq: Every day | ORAL | Status: DC
  Administered 2018-10-09 – 2018-10-10 (×2): 6 mg via ORAL
  Filled 2018-10-09 (×2): qty 1

## 2018-10-09 MED ORDER — LEVOFLOXACIN IN D5W 500 MG/100ML IV SOLN
500.0000 mg | INTRAVENOUS | Status: DC
  Filled 2018-10-09: qty 100

## 2018-10-09 MED ORDER — LEVOFLOXACIN IN D5W 750 MG/150ML IV SOLN
750.0000 mg | INTRAVENOUS | Status: DC
  Filled 2018-10-09: qty 150

## 2018-10-09 NOTE — Consult Note (Signed)
[place] Psychiatry Consult Follow-up  Reason for Consult: Cognitive impairment, moca score 7 out of 30 Per physical therapy Referring Physician: Dr. 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 Total Time spent with patient: 15 minutes  Subjective: "I'm doing fine."  HPI:   Isaac Cross is a 58 y.o. male patient   admitted on 09/12/2018 with a known history of arthritis, COPD not on home oxygen, hypertension, sleep apnea, non-insulin-dependent diabetes mellitus presents to hospital secondary to dizziness, weakness and a fall this morning. Patient had bilateral knee replacement surgeries done about 9 days ago.  He did well for the first week.  Over the last 3 to 4 days he has been having fevers and chills.  Initially had increased frequency of urination that has decreased the last 3 days.  His urine output has been decreased.  He has been feeling dizzy and lightheaded.  Has been nauseous and had one episode of vomiting.  Denies any abdominal pain or diarrhea.  Denies any exposure to sick contacts.  His COVID-19 test is negative here.  Denies any cough or respiratory symptoms or chest pain.  He states that he fell out of bed onto his knees this morning and hurt both his knees.  After the surgery he has been getting home health physical therapy and was able to walk using his walker. In the ED his creatinine is increased from 1 at baseline to 3.7 today.  He received 4.5 L of fluids in the emergency room and his urine output after a Foley catheter was placed here was only 75 cc.  He is hyponatremic, acidotic.  Urine analysis showing possible infection.  Has  lactic acidosis with elevated WBC and procalcitonin.  He is being admitted for sepsis.  Psychiatry consultation is requested for fluctuations in cognition. Record review does indicate that patient has had delirium while hospitalized.  Past Psychiatric History: Anxiety and depression; likely adjustment disorder due to bilateral knee replacement surgeries followed by acute sepsis; delirium per medical records.  10/05/18:  On evaluation this morning, patient is calm and cooperative.  He is alert and oriented x4.  He is situationally aware of hospitalization, personal events/illness and world events.  Patient expresses some frustration regarding his prolonged hospitalization.  He specifically however denies any suicidal ideation, plan or intent stating, "things will never get that bad.  My family depends on me whether I have knees or not."  Patient denies any HI.  He denies any AVH.  Patient expresses that his treatment team is starting to coordinate his discharge planning.  He remains hopeful that he will continue to improve.  Support and encouragement is provided.  10/06/18:  On evaluation this afternoon, patient is pleasant and cooperative.  Denies any depression at this time, low level of anxiety related to wanting to go home and improving.  Complains of feeling tired and fatigued, minimal pain in his knees.  Minimal to no cognitive impairment on assessment.  He was able to talk about his children, grandchildren, and wife.  Identified the ages of children and grandchildren.  No memory issues noted and was able to move himself up in the bed with polar care  in place on his right knee.  Patient appears to be clearing his cognitive impairment.  10/07/2018: During evaluation this afternoon, patient is pleasant smiling and visibly brighter.  He reports that he was able to speak with family and friends with the receipt of his laptop, and that he "told everyone he is starting to feel better which he was waiting to  do until he actually felt better'.  Patient denies depression at this time, and denies current anxiety.  He reports feeling more well rested having gotten a "good sleep" the night before.  Minimal cognitive impairment on assessment, patient continues to present increasingly clear.  He was able to talk about his children, and wife.    Patient appears to be clearing his cognitive impairment.  10/08/2018: Patient presents calm lying down in bed looking at his laptop.  During assessment patient jokes appropriately with this provider, when commenting on the large amount of flowers and balloons in his room he states "Well ill let you in on a little secret my dear, they only give you those when they think your going to die then the old ladies fight over who gets them after" and laughs.  He smiled then stated "the jokes on them i'm coming home after this antibiotic".   He was able to recall recent conversation and repeated his plan of care of looking for an inpatient facility to receive IV ABT at.  Patient denies depression at this time, and denies current anxiety.  Minimal cognitive impairment on assessment, patient continues to present increasingly clear.  He was able to continue to talk about his children, and wife, he reports his son going back to work in Architect and that [place] was "messing things up" for everyone.    Patient appears to be clearing his cognitive impairment.  10/09/2018:  Patient seen and evaluated with the psychiatrist.  No suicidal/homicidal ideations, hallucinations, or substance abuse.  Clear and coherent, alert and oriented times times.  Risk to Self:  None Risk to Others:  None Prior Inpatient Therapy:  None Prior Outpatient Therapy:  None  Past Medical History:  Past Medical History:  Diagnosis Date  . Allergy   . Anxiety   . Arthritis    knees  . Asthma, mild intermittent, well-controlled   . Chronic obstructive asthma ([place])   . COPD (chronic obstructive pulmonary disease)  ([place])   . Depression with anxiety   . Diabetes ([place])   . GERD (gastroesophageal reflux disease)   . HBP (high blood pressure)   . High cholesterol   . Renal cyst, right   . Sleep apnea    CPAP  . Swelling     Past Surgical History:  Procedure Laterality Date  . BLADDER REPAIR    . COLONOSCOPY WITH PROPOFOL N/A 04/07/2015   Procedure: COLONOSCOPY WITH PROPOFOL;  Surgeon: Lucilla Lame, MD;  Location: [facility];  Service: Endoscopy;  Laterality: N/A;  Diabetic - oral meds CPAP  . HAND SURGERY     4TH AND 5TH FINGER  . I&D KNEE WITH POLY EXCHANGE Right 09/18/2018   Procedure: IRRIGATION AND DEBRIDEMENT KNEE WITH POLY EXCHANGE;  Surgeon: Lovell Sheehan, MD;  Location: [facility];  Service: Orthopedics;  Laterality: Right;  . KNEE ARTHROSCOPY Right   . 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 TOTAL KNEE ARTHROPLASTY Left 09/17/2018   Procedure: I &D left knee and polyethylene exchange;  Surgeon: Lovell Sheehan, MD;  Location: [facility];  Service: Orthopedics;  Laterality: Left;  .  VEIN SURGERY Right    Family History:  Family History  Problem Relation Age of Onset  . Hypertension Mother   . Diabetes Mother   . Diabetes Father   . Hypertension Father   . Cancer Father   . Seizures Daughter   . Lupus Daughter   . Cancer Maternal Grandfather        Prostate  . Diabetes Paternal Grandfather   . Prostate cancer Maternal Uncle   . Bladder Cancer Neg Hx   . Kidney cancer Neg Hx    Family Psychiatric  History: Denies  Social History:  Social History   Substance and Sexual Activity  Alcohol Use Yes  . Alcohol/week: 10.0 standard drinks  . Types: 10 Cans of beer per week   Comment: occasional     Social History   Substance and Sexual Activity  Drug Use No    Social History   Socioeconomic History  . Marital status: Married    Spouse name: Not on file  . Number of children: 2  . Years of education: Not on file  . Highest education level:  Bachelor's degree (e.g., BA, AB, BS)  Occupational History  . Occupation: case Freight forwarder   Social Needs  . Financial resource strain: Not hard at all  . Food insecurity    Worry: Never true    Inability: Never true  . Transportation needs    Medical: No    Non-medical: No  Tobacco Use  . Smoking status: Former Smoker    Packs/day: 1.00    Years: 10.00    Pack years: 10.00    Types: Cigarettes    Start date: 04/20/1995    Quit date: 04/19/2005    Years since quitting: 13.4  . Smokeless tobacco: Never Used  Substance and Sexual Activity  . Alcohol use: Yes    Alcohol/week: 10.0 standard drinks    Types: 10 Cans of beer per week    Comment: occasional  . Drug use: No  . Sexual activity: Yes    Partners: Female  Lifestyle  . Physical activity    Days per week: 0 days    Minutes per session: 0 min  . Stress: Not at all  Relationships  . Social connections    Talks on phone: More than three times a week    Gets together: Once a week    Attends religious service: Never    Active member of club or organization: No    Attends meetings of clubs or organizations: Never    Relationship status: Married  Other Topics Concern  . Not on file  Social History Narrative   Lives at home with wife, using a walker since his surgery recently.   Additional Social History:  Patient is married to his current wife, [name] of 7 years.  He has 2 children ages 58 and 77 from his previous wife. 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    Allergies:   Allergies  Allergen Reactions  . 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 Lipitor [Atorvastatin] Hives and Itching  . Other   . Viagra  [Sildenafil Citrate]     vision loss    Labs:  Results for orders placed or performed during the hospital encounter of 09/12/18 (from the past 48 hour(s))  Glucose, capillary     Status: Abnormal    Collection Time: 10/07/18  4:59 PM  Result Value Ref Range   Glucose-Capillary 130 (H) 70 - 99 mg/dL  Glucose, capillary     Status: Abnormal   Collection Time: 10/07/18  9:09 PM  Result Value Ref Range   Glucose-Capillary 114 (H) 70 - 99 mg/dL  Protime-INR     Status: Abnormal   Collection Time: 10/08/18  5:47 AM  Result Value Ref Range   Prothrombin Time 25.1 (H) 11.4 - 15.2 seconds   INR 2.3 (H) 0.8 - 1.2    Comment: (NOTE) INR goal varies based on device and disease states. Performed at [facility], [address] [place], [place] [address]   CK     Status: None   Collection Time: 10/08/18  5:47 AM  Result Value Ref Range   Total CK 86 49 - 397 U/L    Comment: Performed at [facility], [address] [place], [place] [address]  Basic metabolic panel     Status: Abnormal   Collection Time: 10/08/18  5:47 AM  Result Value Ref Range   Sodium 137 135 - 145 mmol/L   Potassium 3.4 (L) 3.5 - 5.1 mmol/L   Chloride 105 98 - 111 mmol/L   CO2 22 22 - 32 mmol/L   Glucose, Bld 159 (H) 70 - 99 mg/dL   BUN 26 (H) 6 - 20 mg/dL   Creatinine, Ser 2.85 (H) 0.61 - 1.24 mg/dL   Calcium 8.3 (L) 8.9 - 10.3 mg/dL   GFR calc non Af Amer 23 (L) >60 mL/min   GFR calc Af Amer 27 (L) >60 mL/min   Anion gap 10 5 - 15    Comment: Performed at [facility], [address] [place], [place] [address]  Glucose, capillary     Status: Abnormal   Collection Time: 10/08/18  7:46 AM  Result Value Ref Range   Glucose-Capillary 136 (H) 70 - 99 mg/dL   Comment 1 Notify RN   Glucose, capillary     Status: Abnormal   Collection Time: 10/08/18 11:40 AM  Result Value Ref Range   Glucose-Capillary 164 (H) 70 - 99 mg/dL   Comment 1 Notify RN   Glucose, capillary     Status: Abnormal   Collection Time: 10/08/18  4:26 PM  Result Value Ref Range   Glucose-Capillary 161 (H) 70 - 99 mg/dL  Glucose, capillary     Status: Abnormal   Collection Time: 10/08/18  9:09 PM  Result Value  Ref Range   Glucose-Capillary 113 (H) 70 - 99 mg/dL  Protime-INR     Status: Abnormal   Collection Time: 10/09/18  4:07 AM  Result Value Ref Range   Prothrombin Time 24.8 (H) 11.4 - 15.2 seconds   INR 2.3 (H) 0.8 - 1.2    Comment: (NOTE) INR goal varies based on device and disease states. Performed at [facility], [address] [place], [place] [address]   Basic metabolic panel     Status: Abnormal   Collection Time: 10/09/18  4:07 AM  Result Value Ref Range   Sodium 140 135 - 145 mmol/L   Potassium 3.7 3.5 - 5.1 mmol/L   Chloride 107 98 - 111 mmol/L   CO2 23 22 - 32 mmol/L   Glucose, Bld 163 (H) 70 - 99 mg/dL   BUN 24 (H) 6 - 20 mg/dL   Creatinine, Ser 2.42 (H) 0.61 - 1.24 mg/dL   Calcium 8.4 (L) 8.9 - 10.3 mg/dL   GFR calc non  Af Amer 28 (L) >60 mL/min   GFR calc Af Amer 32 (L) >60 mL/min   Anion gap 10 5 - 15    Comment: Performed at [facility], [address] [place], [place] [address]  Glucose, capillary     Status: Abnormal   Collection Time: 10/09/18  7:39 AM  Result Value Ref Range   Glucose-Capillary 122 (H) 70 - 99 mg/dL  Glucose, capillary     Status: Abnormal   Collection Time: 10/09/18 11:51 AM  Result Value Ref Range   Glucose-Capillary 112 (H) 70 - 99 mg/dL    Current Facility-Administered Medications  Medication Dose Route Frequency Provider Last Rate Last Dose  . 0.9 %  sodium chloride infusion   Intravenous PRN Flora Lipps, MD 5 mL/hr at 09/29/18 0242    . 0.9 %  sodium chloride infusion  250 mL Intravenous PRN Flora Lipps, MD 0 mL/hr at 09/30/18 1534    . acetaminophen (TYLENOL) tablet 650 mg  650 mg Oral Q6H PRN Lovell Sheehan, MD   650 mg at 10/05/18 1613   Or  . acetaminophen (TYLENOL) suppository 650 mg  650 mg Rectal Q6H PRN Lovell Sheehan, MD   650 mg at 09/20/18 0444  . alum & mag hydroxide-simeth (MAALOX/MYLANTA) 200-200-20 MG/5ML suspension 30 mL  30 mL Oral Q4H PRN Fritzi Mandes, MD      . anticoagulant sodium  citrate solution 5 mL  5 mL Intravenous Q dialysis Lateef, [place], MD   5 mL at 09/29/18 1837  . aspirin EC tablet 81 mg  81 mg Oral Daily Lovell Sheehan, MD   81 mg at 10/09/18 7510  . Chlorhexidine Gluconate Cloth 2 % PADS 6 each  6 each Topical Q0600 Lovell Sheehan, MD   6 each at 10/08/18 0840  . citalopram (CELEXA) tablet 10 mg  10 mg Oral Daily Patrecia Pour, NP   10 mg at 10/09/18 0949  . feeding supplement (NEPRO CARB STEADY) liquid 237 mL  237 mL Oral BID BM Flora Lipps, MD 237 mL/hr at 09/25/18 0951 237 mL at 10/08/18 0839  . fludrocortisone (FLORINEF) tablet 0.1 mg  0.1 mg Oral Daily Hillary Bow, MD   0.1 mg at 10/08/18 0839  . fluticasone furoate-vilanterol (BREO ELLIPTA) 100-25 MCG/INH 1 puff  1 puff Inhalation Daily Fritzi Mandes, MD   1 puff at 10/09/18 0951  . folic acid (FOLVITE) tablet 1 mg  1 mg Oral Daily Fritzi Mandes, MD   1 mg at 10/09/18 0949  . HYDROcodone-acetaminophen (NORCO/VICODIN) 5-325 MG per tablet 1-2 tablet  1-2 tablet Oral Q4H PRN Lovell Sheehan, MD   2 tablet at 09/19/18 0450  . hydrocortisone (CORTEF) tablet 20 mg  20 mg Oral BID Hillary Bow, MD   20 mg at 10/09/18 0954  . insulin aspart (novoLOG) injection 0-5 Units  0-5 Units Subcutaneous QHS Fritzi Mandes, MD      . insulin aspart (novoLOG) injection 0-9 Units  0-9 Units Subcutaneous TID WC Fritzi Mandes, MD   2 Units at 10/08/18 1632  . iohexol (OMNIPAQUE) 240 MG/ML injection 50 mL  50 mL Oral Once PRN Awilda Bill, NP      . ipratropium-albuterol (DUONEB) 0.5-2.5 (3) MG/3ML nebulizer solution 3 mL  3 mL Nebulization Q6H PRN Flora Lipps, MD   3 mL at 09/25/18 0721  . levofloxacin (LEVAQUIN) tablet 500 mg  500 mg Oral Daily Salary, Avel Peace, MD      . MEDLINE  mouth rinse  15 mL Mouth Rinse BID Flora Lipps, MD   15 mL at 10/09/18 0956  . menthol-cetylpyridinium (CEPACOL) lozenge 3 mg  1 lozenge Oral PRN Lovell Sheehan, MD       Or  . phenol (CHLORASEPTIC) mouth spray 1 spray  1 spray Mouth/Throat  PRN Lovell Sheehan, MD      . multivitamin with minerals tablet 1 tablet  1 tablet Oral Daily Fritzi Mandes, MD   1 tablet at 10/09/18 [phone]  . omega-3 acid ethyl esters (LOVAZA) capsule 1 g  1 g Oral Daily Fritzi Mandes, MD   1 g at 10/09/18 0955  . ondansetron (ZOFRAN) tablet 4 mg  4 mg Oral Q6H PRN Lovell Sheehan, MD       Or  . ondansetron [facility]) injection 4 mg  4 mg Intravenous Q6H PRN Lovell Sheehan, MD      . sodium chloride flush (NS) 0.9 % injection 3 mL  3 mL Intravenous Q12H Flora Lipps, MD   3 mL at 10/09/18 0956  . sodium chloride flush (NS) 0.9 % injection 3 mL  3 mL Intravenous PRN Flora Lipps, MD   3 mL at 09/24/18 0805  . warfarin (COUMADIN) tablet 6 mg  6 mg Oral q1800 Salary, Avel Peace, MD      . Warfarin - Pharmacist Dosing Inpatient   Does not apply [id number] Fritzi Mandes, MD        Musculoskeletal: Strength & Muscle Tone: decreased Gait & Station: unsteady Patient leans: N/A  Psychiatric Specialty Exam: Physical Exam  Nursing note and vitals reviewed. Constitutional: He is oriented to person, place, and time. He appears well-developed and well-nourished. No distress.  HENT:  Head: Normocephalic and atraumatic.  Eyes: EOM are normal.  Neck: Normal range of motion.  Cardiovascular: Normal rate and regular rhythm.  Respiratory: Effort normal. No respiratory distress.  Musculoskeletal: Normal range of motion.  Neurological: He is alert and oriented to person, place, and time.  Psychiatric: He has a normal mood and affect. His speech is normal and behavior is normal. Judgment and thought content normal. Cognition and memory are normal.    Review of Systems  Constitutional: Positive for malaise/fatigue.  Respiratory: Negative.   Cardiovascular: Negative.   Gastrointestinal: Negative.   Musculoskeletal: Negative.   Neurological: Negative.   Psychiatric/Behavioral: Positive for memory loss.    Blood pressure (!) 138/96, pulse 89, temperature 98.1 F (36.7 C),  temperature source Oral, resp. rate 19, height 6\' 4"  (1.93 m), weight (!) 156 kg, SpO2 100 %.Body mass index is 41.86 kg/m.  General Appearance: Casual and Neat  Eye Contact:  Good  Speech:  Clear and Coherent and Normal Rate  Volume:  Normal  Mood:  Euthymic  Affect:  Appropriate  Thought Process:  Coherent, Linear and Descriptions of Associations: Intact  Orientation:  Full (Time, Place, and Person)  Thought Content:  Logical and Hallucinations: None  Suicidal Thoughts:  No  Homicidal Thoughts:  No  Memory:  Good  Judgement:  Fair  Insight:  Fair  Psychomotor Activity:  Decreased  Concentration:  Concentration: Good and Attention Span: Good  Recall:  [place] of Knowledge:  Good  Language:  Good  Akathisia:  No  Handed:  Right  AIMS (if indicated):     Assets:  Communication Skills Desire for Improvement Financial Resources/Insurance Housing Intimacy Social Support Talents/Skills Vocational/Educational  ADL's:  Impaired  Cognition:  WNL  Sleep:   Patient has obstructive sleep  apnea, uses BiPAP at night     Treatment Plan Summary: Daily contact with patient to assess and evaluate symptoms and progress in treatment and Medication management  Depression and anxiety Continue Celexa at 10 mg daily for anxiety and depression.  Patient likely has ongoing delirium which presents as fluctuating cognition.  At time of this assessment patient has full capacity.  The patient's exam is notable for altered sensorium, perceptual disturbances, disorientation and cognitive deficits that appear markedly different than their baseline, suggesting a diagnosis of delirium.  Virtually any medical condition or physiologic stress can precipitate delirium in a susceptible individual, with risk increasing in those with: advanced age, sensory impairments, organic brain disease (stroke, dementia, Parkinsons), psychiatric illness, major chronic medical issues, prolonged hospitalizations,  postoperative status, anemia, insomnia/disturbed sleep, and severe pain. Addressing the underlying medical condition and institution of preventative measures are recommended.  - Continue to monitor and treat underlying medical causes of delirium, including infection, electrolyte disturbances, etc. - Delirium precautions - Minimize/avoid deliriogenic meds including: anticholinergic, opiates, benzodiazepines           - Maintain hydration, oxygenation, nutrition           - Limit use of restraints and catheters           - Normalize sleep patterns by minimizing nighttime noise, light and interruptions by     -Ensure sleep apnea treatment is provided overnight.             clustering care, opening blinds during the day           - Reorient the patient frequently, provide easily visible clock and calendar           - Provide sensory aids like glasses, hearing aids           - Encourage ambulation, regular activities and visitors to maintain cognitive stimulation   -Patient would benefit from having family members at bedside to reinforce his orientation.  Disposition: Dr Einar Grad, psychiatrist, evaluated this patient and determined this patient is psychiatrically cleared at this time, delirium resolved along with confusion  Waylan Boga, NP 10/09/2018 12:58 PM

## 2018-10-09 NOTE — Progress Notes (Signed)
[facility]  ROUNDING NOTE   Subjective:   Sitting in chair, eating lunch. No complaints. UOP 960 Further improvement noted in serum creatinine down to 2.4  Objective:  Vital signs in last 24 hours:  Temp:  [97.6 F (36.4 C)-98.5 F (36.9 C)] 98.1 F (36.7 C) (06/22 0739) Pulse Rate:  [89-99] 89 (06/22 0739) Resp:  [18-19] 19 (06/21 2357) BP: (115-141)/(86-96) 138/96 (06/22 0739) SpO2:  [100 %] 100 % (06/22 0739)  Weight change:  Filed Weights   09/29/18 0630 09/29/18 1445 09/29/18 1823  Weight: (!) 157.1 kg (!) 157 kg (!) 156 kg    Intake/Output: I/O last 3 completed shifts: In: 1164 [P.O.:1164] Out: 2060 [Urine:2060]   Intake/Output this shift:  Total I/O In: 240 [P.O.:240] Out: -   Physical Exam: General: NAD, sitting up in the chair  Head: Moist oral mucosal membranes  Eyes: Anicteric   Lungs:  clear  Heart: regular  Abdomen:  Soft, nontender, obese   Extremities: Trace to 1 + peripheral edema  Neurologic: Awake, alert, conversant  Skin: warm        Basic Metabolic Panel: Recent Labs  Lab 10/05/18 0506 10/06/18 0611 10/07/18 0428 10/08/18 0547 10/09/18 0407  NA 137 135 137 137 140  K 3.1* 3.6 3.4* 3.4* 3.7  CL 103 101 104 105 107  CO2 21* 25 19* 22 23  GLUCOSE 89 137* 153* 159* 163*  BUN 35* 31* 28* 26* 24*  CREATININE 6.63* 5.17* 3.86* 2.85* 2.42*  CALCIUM 8.3* 8.2* 8.4* 8.3* 8.4*    Liver Function Tests: No results for input(s): AST, ALT, ALKPHOS, BILITOT, PROT, ALBUMIN in the last 168 hours. No results for input(s): LIPASE, AMYLASE in the last 168 hours. No results for input(s): AMMONIA in the last 168 hours.  CBC: Recent Labs  Lab 10/03/18 0302 10/04/18 0542 10/05/18 0506  WBC 10.4 10.2  10.4 8.3  NEUTROABS  --  5.7 4.3  HGB 8.8* 8.2*  8.2* 7.8*  HCT 27.8* 26.6*  25.9* 24.3*  MCV 82.0 85.0  82.5 82.1  PLT 494* 472*  450* 354    Cardiac Enzymes: Recent Labs  Lab 10/05/18 0506 10/08/18 0547  CKTOTAL 92  86    BNP: Invalid input(s): POCBNP  CBG: Recent Labs  Lab 10/08/18 1140 10/08/18 1626 10/08/18 2109 10/09/18 0739 10/09/18 1151  GLUCAP 164* 161* 113* 122* 112*    Microbiology: Results for orders placed or performed during the hospital encounter of 09/12/18  Blood Culture (routine x 2)     Status: None   Collection Time: 09/12/18  9:56 AM   Specimen: BLOOD LEFT HAND  Result Value Ref Range Status   Specimen Description BLOOD LEFT HAND  Final   Special Requests   Final    BOTTLES DRAWN AEROBIC AND ANAEROBIC Blood Culture adequate volume   Culture   Final    NO GROWTH 5 DAYS Performed at [facility], [address], [place], [place] [address]    Report Status 09/17/2018 FINAL  Final  Urine culture     Status: None   Collection Time: 09/12/18 10:01 AM   Specimen: Urine, Random  Result Value Ref Range Status   Specimen Description   Final    URINE, RANDOM Performed at [facility], [address], [place], [place] [address]    Special Requests   Final    NONE Performed at [facility], [address] [place],  [address]    Culture   Final  NO GROWTH Performed at [facility], [place] [address] [place], [place] [address]    Report Status 09/13/2018 FINAL  Final  SARS Coronavirus 2 (CEPHEID- Performed in [place] hospital lab), Hosp Order     Status: None   Collection Time: 09/12/18 10:02 AM   Specimen: Nasopharyngeal Swab  Result Value Ref Range Status   SARS Coronavirus 2 NEGATIVE NEGATIVE Final    Comment: (NOTE) If result is NEGATIVE SARS-CoV-2 target nucleic acids are NOT DETECTED. The SARS-CoV-2 RNA is generally detectable in upper and lower  respiratory specimens during the acute phase of infection. The lowest  concentration of SARS-CoV-2 viral copies this assay can detect is 250  copies / mL. A negative result does not preclude SARS-CoV-2 infection  and should not be used as the sole basis for treatment or  other  patient management decisions.  A negative result may occur with  improper specimen collection / handling, submission of specimen other  than nasopharyngeal swab, presence of viral mutation(s) within the  areas targeted by this assay, and inadequate number of viral copies  (<250 copies / mL). A negative result must be combined with clinical  observations, patient history, and epidemiological information. If result is POSITIVE SARS-CoV-2 target nucleic acids are DETECTED. The SARS-CoV-2 RNA is generally detectable in upper and lower  respiratory specimens dur ing the acute phase of infection.  Positive  results are indicative of active infection with SARS-CoV-2.  Clinical  correlation with patient history and other diagnostic information is  necessary to determine patient infection status.  Positive results do  not rule out bacterial infection or co-infection with other viruses. If result is PRESUMPTIVE POSTIVE SARS-CoV-2 nucleic acids MAY BE PRESENT.   A presumptive positive result was obtained on the submitted specimen  and confirmed on repeat testing.  While 2019 novel coronavirus  (SARS-CoV-2) nucleic acids may be present in the submitted sample  additional confirmatory testing may be necessary for epidemiological  and / or clinical management purposes  to differentiate between  SARS-CoV-2 and other Sarbecovirus currently known to infect humans.  If clinically indicated additional testing with an alternate test  methodology [phone]) is advised. The SARS-CoV-2 RNA is generally  detectable in upper and lower respiratory sp ecimens during the acute  phase of infection. The expected result is Negative. Fact Sheet for Patients:  StrictlyIdeas.no Fact Sheet for Healthcare Providers: BankingDealers.co.za This test is not yet approved or cleared by the [place] FDA and has been authorized for detection and/or diagnosis of  SARS-CoV-2 by FDA under an Emergency Use Authorization (EUA).  This EUA will remain in effect (meaning this test can be used) for the duration of the COVID-19 declaration under Section 564(b)(1) of the Act, 21 U.S.C. section 360bbb-3(b)(1), unless the authorization is terminated or revoked sooner. Performed at [facility], [address] [place], [place] [address]   Blood Culture (routine x 2)     Status: None   Collection Time: 09/12/18 10:06 AM   Specimen: BLOOD  Result Value Ref Range Status   Specimen Description BLOOD LEFT ANTECUBITAL  Final   Special Requests   Final    BOTTLES DRAWN AEROBIC AND ANAEROBIC Blood Culture adequate volume   Culture   Final    NO GROWTH 5 DAYS Performed at [facility], [address] [place], [place] [address]    Report Status 09/17/2018 FINAL  Final  MRSA PCR Screening     Status: None   Collection Time: 09/12/18  7:04 PM   Specimen: Nasal Mucosa; Nasopharyngeal  Result Value Ref Range Status   MRSA by PCR NEGATIVE NEGATIVE Final    Comment:        The GeneXpert MRSA Assay (FDA approved for NASAL specimens only), is one component of a comprehensive MRSA colonization surveillance program. It is not intended to diagnose MRSA infection nor to guide or monitor treatment for MRSA infections. Performed at [facility], [address] [place], [place] [address]   C difficile quick scan w PCR reflex     Status: None   Collection Time: 09/14/18  2:21 PM   Specimen: STOOL  Result Value Ref Range Status   C Diff antigen NEGATIVE NEGATIVE Final   C Diff toxin NEGATIVE NEGATIVE Final   C Diff interpretation No C. difficile detected.  Final    Comment: Performed at [facility], [address] [place], [place] [address]  Group A Strep by PCR     Status: None   Collection Time: 09/15/18  1:53 PM  Result Value Ref Range Status   Group A Strep by PCR NOT DETECTED NOT DETECTED Final    Comment: Performed  at [facility], [address], [place], [place] [address]  Body fluid culture     Status: None   Collection Time: 09/15/18  5:13 PM   Specimen: KNEE  Result Value Ref Range Status   Specimen Description   Final    KNEE Performed at [facility], [address], [place], [place] [address]    Special Requests   Final    NONE Performed at [facility], [address] [place], [place] [address]    Gram Stain   Final    RARE RBCS FEW WBC SEEN NO ORGANISMS SEEN Performed at [facility], [address], [place], [place] [address]    Culture   Final    NO GROWTH 3 DAYS Performed at [facility], [place] [address], [place], [place] [address]    Report Status 09/19/2018 FINAL  Final  Body fluid culture     Status: None   Collection Time: 09/16/18  8:55 AM   Specimen: KNEE  Result Value Ref Range Status   Specimen Description KNEE RIGHT  Final   Special Requests NONE  Final   Gram Stain   Final    RARE WBC PRESENT, PREDOMINANTLY PMN NO ORGANISMS SEEN    Culture   Final    NO GROWTH 3 DAYS Performed at [facility], [place] [address] [place], [place] [address]    Report Status 09/20/2018 FINAL  Final  Aerobic/Anaerobic Culture (surgical/deep wound)     Status: None   Collection Time: 09/17/18 11:32 AM   Specimen: Wound  Result Value Ref Range Status   Specimen Description   Final    TISSUE LEFT KNEE 1 Performed at [facility], [address] [place], [place] [address]    Special Requests PATIENT ON FOLLOWING MAXIPIME VANCOMYCIN  Final   Gram Stain   Final    FEW WBC PRESENT,BOTH PMN AND MONONUCLEAR NO ORGANISMS SEEN    Culture   Final    No growth aerobically or anaerobically. Performed at [facility], [place] [address], [place],  [address]    Report Status 09/22/2018 FINAL  Final  Acid Fast Smear (AFB)     Status: None   Collection Time: 09/17/18 11:32 AM   Specimen: Synovium  Result Value Ref  Range Status   AFB Specimen Processing Comment  Final    Comment: Tissue Grinding and Digestion/Decontamination   Acid Fast Smear Negative  Final    Comment: (NOTE) Performed At: [facility] [place], [place] [id number] Rush Farmer MD YW:[id number]    Source (AFB) TISSUE  Final    Comment: LEFT KNEE Performed at [facility], [place] [address], [place], [place] [address]   Culture, fungus without smear     Status: None (Preliminary result)   Collection Time: 09/17/18 11:34 AM   Specimen: Synovium; Other  Result Value Ref Range Status   Specimen Description   Final    SYNOVIAL Performed at [facility], [address] [place], [place] [address]    Special Requests   Final    NONE Performed at [facility], [address], [place], [place] [address]    Culture   Final    NO FUNGUS ISOLATED AFTER 14 DAYS Performed at [facility], [place] [address], [place], [place] [address]    Report Status PENDING  Incomplete  Acid Fast Culture with reflexed sensitivities     Status: None   Collection Time: 09/17/18 11:36 AM   Specimen: Synovium  Result Value Ref Range Status   Acid Fast Culture CANC  Final    Comment: (NOTE) Test cancelled at client's request.      Cancelled per Levin Erp 09/21/2018 Performed At: [facility] [place], [place] [id number] Rush Farmer MD [id number]    Source of Sample TISSUE  Final    Comment: RIGHT KNEE Performed at [facility], [place] [address], [place], [place] [address]   Aerobic/Anaerobic Culture (surgical/deep wound)     Status: None   Collection Time: 09/17/18 11:43 AM   Specimen: Wound  Result Value Ref Range Status   Specimen Description   Final    TISSUE LEFT KNEE 2 Performed at [facility], [address] [place], [place] [address]    Special Requests PATIENT ON FOLLOWING MAXIPIME VANCOMYCIN  Final   Gram Stain   Final    FEW WBC  PRESENT, PREDOMINANTLY PMN NO ORGANISMS SEEN    Culture   Final    No growth aerobically or anaerobically. Performed at [facility], [place] [address], [place], [place] [address]    Report Status 09/22/2018 FINAL  Final  Aerobic/Anaerobic Culture (surgical/deep wound)     Status: None   Collection Time: 09/17/18 11:44 AM   Specimen: Wound  Result Value Ref Range Status   Specimen Description   Final    TISSUE LEFT KNEE 3 Performed at [facility], [address] [place], [place] [address]    Special Requests PATIENT ON FOLLOWING MAXIPIME VANCOMYCIN  Final   Gram Stain   Final    MODERATE WBC PRESENT, PREDOMINANTLY PMN NO ORGANISMS SEEN    Culture   Final    No growth aerobically or anaerobically. Performed at [facility], [place] [address], [place], [place] [address]    Report Status 09/22/2018 FINAL  Final  Aerobic/Anaerobic Culture (surgical/deep wound)     Status: None   Collection Time: 09/18/18  9:43 PM   Specimen: [facility] Other; Tissue  Result Value Ref Range Status   Specimen Description   Final    SYNOVIAL Performed at [facility], [address], [place], [place] [address]    Special Requests   Final    NONE  Performed at [facility] [address] [place], [place] [address]    Gram Stain   Final    RARE WBC PRESENT, PREDOMINANTLY MONONUCLEAR NO ORGANISMS SEEN    Culture   Final    No growth aerobically or anaerobically. Performed at [facility], [place] [address] [place], [place] [address]    Report Status 09/24/2018 FINAL  Final  Aerobic/Anaerobic Culture (surgical/deep wound)     Status: None   Collection Time: 09/18/18  9:44 PM   Specimen: [facility] Other; Tissue  Result Value Ref Range Status   Specimen Description   Final    SYNOVIAL Performed at [facility], [address], [place], [place] [address]    Special Requests   Final    RIGHT KNEE Performed at [facility], [place], [place] [address]    Gram Stain   Final    FEW WBC PRESENT,BOTH PMN AND MONONUCLEAR NO ORGANISMS SEEN    Culture   Final    No growth aerobically or anaerobically. Performed at [facility], [place] [address] [place], [place] [address]    Report Status 09/24/2018 FINAL  Final  Aerobic/Anaerobic Culture (surgical/deep wound)     Status: None   Collection Time: 09/18/18  9:45 PM   Specimen: [facility] Other; Tissue  Result Value Ref Range Status   Specimen Description   Final    SYNOVIAL Performed at [facility], [address] [place], [place] [address]    Special Requests   Final    RIGHT KNEE Performed at [facility], [address] [place], [place] [address]    Gram Stain   Final    FEW WBC PRESENT, PREDOMINANTLY MONONUCLEAR NO ORGANISMS SEEN    Culture   Final    No growth aerobically or anaerobically. Performed at [facility], [place] [address], [place], [place] [address]    Report Status 09/24/2018 FINAL  Final  Culture, blood (Routine X 2) w Reflex to ID Panel     Status: None   Collection Time: 09/19/18  1:11 PM   Specimen: BLOOD  Result Value Ref Range Status   Specimen Description BLOOD LEFT ANTECUBITAL  Final   Special Requests   Final    BOTTLES DRAWN AEROBIC AND ANAEROBIC Blood Culture results may not be optimal due to an excessive volume of blood received in culture bottles   Culture   Final    NO GROWTH 5 DAYS Performed at [facility], [address] [place], [place] [address]    Report Status 09/24/2018 FINAL  Final  Culture, blood (Routine X 2) w Reflex to ID Panel     Status: None   Collection Time: 09/19/18  1:20 PM   Specimen: BLOOD  Result Value Ref Range Status   Specimen Description BLOOD BLOOD RIGHT HAND  Final   Special Requests   Final    BOTTLES DRAWN AEROBIC AND ANAEROBIC Blood Culture results may not be optimal due to an excessive volume of blood received in culture bottles   Culture   Final     NO GROWTH 5 DAYS Performed at [facility], [address] [place], [place] [address]    Report Status 09/24/2018 FINAL  Final  Urine Culture     Status: None   Collection Time: 09/20/18  3:59 PM   Specimen: Urine, Random  Result Value Ref Range Status   Specimen Description   Final  URINE, RANDOM Performed at [facility], [address] [place], [place] [address]    Special Requests   Final    NONE Performed at [facility], [address], [place], [place] [address]    Culture   Final    NO GROWTH Performed at [facility], [place] [address] [place], [place] [address]    Report Status 09/21/2018 FINAL  Final  C difficile quick scan w PCR reflex     Status: None   Collection Time: 09/21/18  3:27 PM   Specimen: STOOL  Result Value Ref Range Status   C Diff antigen NEGATIVE NEGATIVE Final   C Diff toxin NEGATIVE NEGATIVE Final   C Diff interpretation No C. difficile detected.  Final    Comment: Performed at [facility], [address] [place], [place] [address]  Gastrointestinal Panel by PCR , Stool     Status: None   Collection Time: 09/21/18  3:27 PM   Specimen: Stool  Result Value Ref Range Status   Campylobacter species NOT DETECTED NOT DETECTED Final   Plesimonas shigelloides NOT DETECTED NOT DETECTED Final   Salmonella species NOT DETECTED NOT DETECTED Final   Yersinia enterocolitica NOT DETECTED NOT DETECTED Final   Vibrio species NOT DETECTED NOT DETECTED Final   Vibrio cholerae NOT DETECTED NOT DETECTED Final   Enteroaggregative E coli (EAEC) NOT DETECTED NOT DETECTED Final   Enteropathogenic E coli (EPEC) NOT DETECTED NOT DETECTED Final   Enterotoxigenic E coli (ETEC) NOT DETECTED NOT DETECTED Final   Shiga like toxin producing E coli (STEC) NOT DETECTED NOT DETECTED Final   Shigella/Enteroinvasive E coli (EIEC) NOT DETECTED NOT DETECTED Final   Cryptosporidium NOT DETECTED NOT DETECTED Final   Cyclospora  cayetanensis NOT DETECTED NOT DETECTED Final   Entamoeba histolytica NOT DETECTED NOT DETECTED Final   Giardia lamblia NOT DETECTED NOT DETECTED Final   Adenovirus F40/41 NOT DETECTED NOT DETECTED Final   Astrovirus NOT DETECTED NOT DETECTED Final   Norovirus GI/GII NOT DETECTED NOT DETECTED Final   Rotavirus A NOT DETECTED NOT DETECTED Final   Sapovirus (I, II, IV, and V) NOT DETECTED NOT DETECTED Final    Comment: Performed at [facility], [address] [place], [place] [address]  Novel Coronavirus, NAA (hospital order; send-out to ref lab)     Status: None   Collection Time: 09/29/18  4:36 PM   Specimen: Nasopharyngeal Swab; Respiratory  Result Value Ref Range Status   SARS-CoV-2, NAA NOT DETECTED NOT DETECTED Final    Comment: (NOTE) This test was developed and its performance characteristics determined by [employer]. This test has not been FDA cleared or approved. This test has been authorized by FDA under an Emergency Use Authorization (EUA). This test is only authorized for the duration of time the declaration that circumstances exist justifying the authorization of the emergency use of in vitro diagnostic tests for detection of SARS-CoV-2 virus and/or diagnosis of COVID-19 infection under section 564(b)(1) of the Act, 21 U.S.C. 338SNK-5(L)(9), unless the authorization is terminated or revoked sooner. When diagnostic testing is negative, the possibility of a false negative result should be considered in the context of a patient's recent exposures and the presence of clinical signs and symptoms consistent with COVID-19. An individual without symptoms of COVID-19 and who is not shedding SARS-CoV-2 virus would expect to have a negative (not detected) result in this assay. Performed  At: [facility] [address] [place], [place] [id number] Perlie Gold  Derinda Late MD VH:[id number]    Coronavirus Source NASOPHARYNGEAL  Final    Comment: Performed at  [facility], [address] [place], [place] [address]  CULTURE, BLOOD (ROUTINE X 2) w Reflex to ID Panel     Status: None   Collection Time: 10/02/18  5:26 PM   Specimen: BLOOD  Result Value Ref Range Status   Specimen Description BLOOD BLOOD RIGHT FOREARM  Final   Special Requests   Final    BOTTLES DRAWN AEROBIC AND ANAEROBIC Blood Culture adequate volume   Culture   Final    NO GROWTH 5 DAYS Performed at [facility], [address] [place], [place] [address]    Report Status 10/07/2018 FINAL  Final  CULTURE, BLOOD (ROUTINE X 2) w Reflex to ID Panel     Status: None   Collection Time: 10/02/18  5:41 PM   Specimen: BLOOD  Result Value Ref Range Status   Specimen Description BLOOD BLOOD RIGHT HAND  Final   Special Requests   Final    BOTTLES DRAWN AEROBIC AND ANAEROBIC Blood Culture adequate volume   Culture   Final    NO GROWTH 5 DAYS Performed at [facility], [address] [place], [place] [address]    Report Status 10/07/2018 FINAL  Final    Coagulation Studies: Recent Labs    10/07/18 0428 10/08/18 0547 10/09/18 0407  LABPROT 24.4* 25.1* 24.8*  INR 2.2* 2.3* 2.3*    Urinalysis: No results for input(s): COLORURINE, LABSPEC, PHURINE, GLUCOSEU, HGBUR, BILIRUBINUR, KETONESUR, PROTEINUR, UROBILINOGEN, NITRITE, LEUKOCYTESUR in the last 72 hours.  Invalid input(s): APPERANCEUR    Imaging: No results found.   Medications:   . sodium chloride 5 mL/hr at 09/29/18 0242  . sodium chloride 0 mL/hr at 09/30/18 1534  . anticoagulant sodium citrate    . DAPTOmycin (CUBICIN)  IV     . aspirin EC  81 mg Oral Daily  . Chlorhexidine Gluconate Cloth  6 each Topical Q0600  . citalopram  10 mg Oral Daily  . feeding supplement (NEPRO CARB STEADY)  237 mL Oral BID BM  . fludrocortisone  0.1 mg Oral Daily  . fluticasone furoate-vilanterol  1 puff Inhalation Daily  . folic acid  1 mg Oral Daily  . hydrocortisone  20 mg Oral BID  . insulin  aspart  0-5 Units Subcutaneous QHS  . insulin aspart  0-9 Units Subcutaneous TID WC  . levofloxacin  500 mg Oral Daily  . mouth rinse  15 mL Mouth Rinse BID  . multivitamin with minerals  1 tablet Oral Daily  . omega-3 acid ethyl esters  1 g Oral Daily  . sodium chloride flush  3 mL Intravenous Q12H  . warfarin  6 mg Oral q1800  . Warfarin - Pharmacist Dosing Inpatient   Does not apply q1800   sodium chloride, sodium chloride, acetaminophen **OR** acetaminophen, alum & mag hydroxide-simeth, anticoagulant sodium citrate, HYDROcodone-acetaminophen, iohexol, ipratropium-albuterol, menthol-cetylpyridinium **OR** phenol, ondansetron **OR** ondansetron (ZOFRAN) IV, sodium chloride flush  Assessment/ Plan:  Isaac Cross is a 58 y.o. black male with hyeprtension, COPD, diabetes mellitus type II who underwent bilateral knee replacement surgeries on 5/15 at [facility] who was admitted to [facility] on 09/12/2018 for septic arthritis. Hospital course with left knee surgery on 5/31 and right knee surgery on 6/1 by Dr. Harlow Mares.   1. Acute renal failure  baseline creatinine of 1.07 with normal GFR in 06/12/18.  Acute renal failure secondary to ATN. Required  hemodialysis. First treatment was 6/9. Last hemodialysis treatment 6/12.  Continues to have improving renal function and nonoliguric urine output.  Dialysis access has been removed. No indication for further dialysis.   2. Septic shock: with septic arthritis.  -Antibiotics as per recommendations of the ID team 4 weeks of ceftazidime and daptomycin (6mg /kg) until 10/31/2018  3. Hypotension and Hypokalemia: with concern of adrenal insufficiency.   BP acceptable MRI with bilateral masses - Will need  Further workup as an outpatient - fludrocortisone and hydrocortisone - MRI with gadolinium when renal function back to baseline.   4. Anemia with renal failure: Lab Results  Component Value Date   HGB 7.8 (L) 10/05/2018  monitor closely      LOS: [place] 6/22/20203:48 PM

## 2018-10-09 NOTE — Plan of Care (Signed)
  Problem: Education: Goal: Knowledge of General Education information will improve Description: Including pain rating scale, medication(s)/side effects and non-pharmacologic comfort measures Outcome: Progressing   Problem: Health Behavior/Discharge Planning: Goal: Ability to manage health-related needs will improve Outcome: Progressing   Problem: Clinical Measurements: Goal: Ability to maintain clinical measurements within normal limits will improve Outcome: Progressing Goal: Will remain free from infection Outcome: Progressing Goal: Diagnostic test results will improve Outcome: Progressing Goal: Respiratory complications will improve Outcome: Progressing Goal: Cardiovascular complication will be avoided Outcome: Progressing   Problem: Activity: Goal: Risk for activity intolerance will decrease Outcome: Progressing   Problem: Nutrition: Goal: Adequate nutrition will be maintained Outcome: Progressing   Problem: Coping: Goal: Level of anxiety will decrease Outcome: Progressing   Problem: Elimination: Goal: Will not experience complications related to bowel motility Outcome: Progressing Goal: Will not experience complications related to urinary retention Outcome: Progressing   Problem: Pain Managment: Goal: General experience of comfort will improve Outcome: Progressing   Problem: Safety: Goal: Ability to remain free from injury will improve Outcome: Progressing   Problem: Skin Integrity: Goal: Risk for impaired skin integrity will decrease Outcome: Progressing   Problem: Education: Goal: Knowledge of disease or condition will improve Outcome: Progressing Goal: Understanding of discharge needs will improve Outcome: Progressing   Problem: Health Behavior/Discharge Planning: Goal: Ability to identify changes in lifestyle to reduce recurrence of condition will improve Outcome: Progressing Goal: Identification of resources available to assist in meeting health  care needs will improve Outcome: Progressing   Problem: Physical Regulation: Goal: Complications related to the disease process, condition or treatment will be avoided or minimized Outcome: Progressing   Problem: Safety: Goal: Ability to remain free from injury will improve Outcome: Progressing   Problem: Health Behavior/Discharge Planning: Goal: Ability to manage health-related needs will improve Outcome: Progressing   Problem: Clinical Measurements: Goal: Ability to maintain clinical measurements within normal limits will improve Outcome: Progressing   Problem: Clinical Measurements: Goal: Will remain free from infection Outcome: Progressing   Problem: Clinical Measurements: Goal: Diagnostic test results will improve Outcome: Progressing   Problem: Clinical Measurements: Goal: Respiratory complications will improve Outcome: Progressing   Problem: Clinical Measurements: Goal: Cardiovascular complication will be avoided Outcome: Progressing

## 2018-10-09 NOTE — TOC Progression Note (Signed)
Transition of Care [facility]) - Progression Note    Patient Details  Name: Isaac Cross MRN: [id number] Date of Birth: 11/25/1960  Transition of [facility]) CM/SW Contact  Cherice Glennie, Lenice Llamas Phone Number: [phone]  10/09/2018, 3:17 PM  Clinical Narrative: Clinical Social Worker ([place]) contacted patient's wife [name] to discuss D/C plan. Per wife patient is adamant that he return home and does not want to go to inpatient rehab at [facility] or a SNF. Patient did better with PT today and ambulated in the hall way. Per wife she has ordered patient a lift recliner chair and it will be at her house tomorrow at 10 am. Per wife patient has a rolling walker and bedside commode at home already. Per wife patient was using [facility] home health prior to hospital stay and she wants to continue to use them. Wife is aware that per [facility] representative patient should have $0 co-pay for home IV ABX. Pam is aware of above. [place] representative is aware of above. MD aware of above. Plan is for patient to D/C home tomorrow. Patient's wife is in agreement with plan. CSW will continue to follow and assist as needed.     Expected Discharge Plan: [place] Barriers to Discharge: Waiting for outpatient dialysis, Other (comment)(acute renal currently)  Expected Discharge Plan and Services Expected Discharge Plan: [place] In-house Referral: Clinical Social Work Discharge Planning Services: CM Consult Post Acute Care Choice: [place] arrangements for the past 2 months: [place]: (Patient stated that he is open to home health. ) Date [place]: 09/18/18 Time [place]: [place] Representative spoke with at [place]: [name]- unsure of when patient will discharge.   Social Determinants of Health (SDOH) Interventions    Readmission Risk Interventions No  flowsheet data found.

## 2018-10-09 NOTE — Evaluation (Addendum)
Physical Therapy Evaluation Patient Details Name: Isaac Cross MRN: [id number] DOB: 03/13/1961 Today's Date: 10/09/2018   History of Present Illness  Pt is a 58 y.o. male presenting to hospital 09/12/18 after rolling OOB landing on L knee; pt with increased dizziness x2 days, fevers x3 days, and generalized weakness.  S/p B TKR 09/01/18 in [place] (pt reports discharging home same day).  Pt admitted with sepsis, acute renal failure, and B TKR.  Pt s/p L knee 5/31 and R knee 6/1 I&D with poly exchange.  Pt noted with severe encephalopathy from DT's with extensive ETOH abuse.  L LE DVT noted.  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  HD started 6/5 with L IJ temporary HD catheter.  PMH includes B TKR 09/01/18 in [place], DM, asthma, R vein surgery, COPD. Right IJ line removed 6/16. 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  Clinical Impression  Patient with marked improvement in cognitive status and overall level of alertness, participation with session this date.  Appropriately following commands, interacting with therapist and participating well with session; does display intermittent impulsivity and limited insight at times (unaware of need for rest breaks, feeling like he is going to discharge after session today).  Improving flexibility and ROM bilat knees (R 5-95 degrees, L 5-90 degrees) this date; encouraged to continue with knee flexion as tolerated with sit/stand efforts to facilitate functional strength/stretch as tolerated (vs 'vaulting' over LEs and relying solely on UEs).  Good efforts to integrate with cuing/education.  Able to formally initiate gait training this date, completing 40' x2 with RW, min assist +2 for optimal safety.  Continues with BP drop during upright activities, but asymptomatic and non-limiting to progression at this time.  Patient does tend to drape self over RW at times for standing rest periods with gait trials, but demonstrates no overt buckling,  LOB during distance.  Patient very pleased with progress and eager to continue with therapy as able. *Of note, see OT re-evaluation from this date for details of vitals response to activity. Would benefit from skilled PT to address above deficits and promote optimal return to PLOF; recommend transition to acute inpatient rehab upon discharge for high-intensity, post-acute rehab services.  Goals and POC reviewed, updated and extended as appropriate to reflect continued need for high-intensity, post-acute rehab services.      Follow Up Recommendations CIR    Equipment Recommendations  3in1 (PT);Other (comment)    Recommendations for Other Services       Precautions / Restrictions Precautions Precautions: Fall Precaution Comments: watch orthostatics Restrictions Weight Bearing Restrictions: Yes RLE Weight Bearing: Weight bearing as tolerated LLE Weight Bearing: Weight bearing as tolerated      Mobility  Bed Mobility Overal bed mobility: Needs Assistance Bed Mobility: Supine to Sit     Supine to sit: Supervision     General bed mobility comments: seated edge of bed with OT upon arrival to session; in recliner end of session  Transfers Overall transfer level: Needs assistance Equipment used: Rolling walker (2 wheeled) Transfers: Sit to/from Stand Sit to Stand: Min guard;Min assist;+2 physical assistance;+2 safety/equipment         General transfer comment: heavy use of UEs to assist with lift off and eccentric control with stand to sit; encouraged to maintain knee flexion as tolerated for functional strength/stretch with mobility  Ambulation/Gait Ambulation/Gait assistance: Min guard;Min assist;+2 physical assistance Gait Distance (Feet): (50 x2) Assistive device: Rolling walker (2 wheeled)  General Gait Details: broad bos wtih bilat toe in throughout gait cycle; somewhat impulsive at times, limited insight into safety needs at times.  Heavy WBing bilat UEs,  intermittently stopping to drape self over walker for rest break (despite encouragement to remain tall and upright).  Stairs            Wheelchair Mobility    Modified Rankin (Stroke Patients Only)       Balance Overall balance assessment: Needs assistance Sitting-balance support: No upper extremity supported;Feet supported Sitting balance-Leahy Scale: Good     Standing balance support: Bilateral upper extremity supported Standing balance-Leahy Scale: Fair Standing balance comment: requires bilat UE support at all times                             Pertinent Vitals/Pain Pain Assessment: Faces Faces Pain Scale: Hurts a little bit Pain Location: bilat knees Pain Descriptors / Indicators: Aching;Guarding;Grimacing Pain Intervention(s): Limited activity within patient's tolerance;Monitored during session;Repositioned    Home Living Family/patient expects to be discharged to:: Private residence Living Arrangements: Spouse/significant other Available Help at Discharge: Family Type of Home: House Home Access: Stairs to enter Entrance Stairs-Rails: Psychiatric nurse of Steps: 2 Home Layout: Two level;Able to live on main level with bedroom/bathroom Home Equipment: Gilford Rile - 2 wheels;Toilet riser;Grab bars - tub/shower;Bedside commode;Shower seat - built in;Hand held shower head;Adaptive equipment      Prior Function Level of Independence: Independent with assistive device(s)         Comments: Ambulatory with RW.  Pt reports doing well s/p B TKR.  Retired Event organiser, currently working in case Mudlogger with community agencies     Hand Dominance   Dominant Hand: Right    Extremity/Trunk Assessment   Upper Extremity Assessment Upper Extremity Assessment: Overall WFL for tasks assessed    Lower Extremity Assessment Lower Extremity Assessment: Generalized weakness(grossly at least 3-/5 throughout bilat knees.  R knee 5-95 degrees, L  knee 5-90 degree)    Cervical / Trunk Assessment Cervical / Trunk Assessment: Normal  Communication   Communication: No difficulties  Cognition Arousal/Alertness: Awake/alert Behavior During Therapy: WFL for tasks assessed/performed Overall Cognitive Status: Impaired/Different from baseline                                 General Comments: marked improvement in alertness, awareness and overall participation with session; pleasant and cooperative, motivated to participate/progress      General Comments      Exercises Total Joint Exercises Goniometric ROM: R knee: 5-95 degrees, L knee: 5-90 degrees Other Exercises Other Exercises: Sit/stand from edge of bed (standard height) and from recliner (slightly elevated compared to standard chair), min assist +2 for safety.  Tends to fully extend LEs and 'vault' onto LEs in standing; encouraged for increased bilat knee flex with standing/sitting as tolearted for functional stretch with mobility.  Good efforts to integrate with cuing.   Assessment/Plan    PT Assessment Patient needs continued PT services  PT Problem List Decreased strength;Decreased range of motion;Decreased activity tolerance;Decreased balance;Decreased mobility;Decreased knowledge of use of DME;Decreased knowledge of precautions;Pain;Decreased skin integrity;Decreased cognition;Decreased safety awareness       PT Treatment Interventions DME instruction;Gait training;Stair training;Functional mobility training;Therapeutic activities;Therapeutic exercise;Balance training;Patient/family education;Cognitive remediation    PT Goals (Current goals can be found in the Care Plan section)  Acute Rehab PT Goals Patient Stated Goal: to  go to CIR and then go home PT Goal Formulation: With patient Time For Goal Achievement: 10/23/18 Potential to Achieve Goals: Good    Frequency 7X/week   Barriers to discharge        Co-evaluation   Reason for Co-Treatment: For  patient/therapist safety;Complexity of the patient's impairments (multi-system involvement) PT goals addressed during session: Mobility/safety with mobility OT goals addressed during session: ADL's and self-care       AM-PAC PT "6 Clicks" Mobility  Outcome Measure Help needed turning from your back to your side while in a flat bed without using bedrails?: A Little Help needed moving from lying on your back to sitting on the side of a flat bed without using bedrails?: A Little Help needed moving to and from a bed to a chair (including a wheelchair)?: A Little Help needed standing up from a chair using your arms (e.g., wheelchair or bedside chair)?: A Little Help needed to walk in hospital room?: A Little Help needed climbing 3-5 steps with a railing? : A Lot 6 Click Score: 17    End of Session Equipment Utilized During Treatment: Gait belt Activity Tolerance: Patient tolerated treatment well Patient left: in chair;with chair alarm set;with call bell/phone within reach Nurse Communication: Mobility status PT Visit Diagnosis: Other abnormalities of gait and mobility (R26.89);Muscle weakness (generalized) (M62.81);Difficulty in walking, not elsewhere classified (R26.2);Pain Pain - Right/Left: Left(bilat knees)    Time: 5374-8270 PT Time Calculation (min) (ACUTE ONLY): 30 min   Charges:   PT Evaluation $PT Re-evaluation: 1 Re-eval          Yvette Roark H. Owens Shark, PT, DPT, NCS 10/09/18, 12:37 PM [phone]

## 2018-10-09 NOTE — Progress Notes (Addendum)
[place] at [place] NAME: Shawnee Higham    MR#:  [id number]  DATE OF BIRTH:  November 02, 1960  SUBJECTIVE:  Patient without complaint, physical therapy continues to be quite challenging-they are recommending SNF status post discharge, however patient wants to go home, currently unable to be discharged due to SNF unwilling to pay for expensive antibiotics, discussed with case management/social work-currently trying to assess how much expense will be on an outpatient basis if this can be done at home, currently unable to be discharged due to severe orthostatic hypotension/poor mobility, nephrology/ID input greatly appreciated  REVIEW OF SYSTEMS:  Review of Systems  Constitutional: Positive for malaise/fatigue. Negative for chills, fever and weight loss.  HENT: Negative for ear discharge, ear pain and nosebleeds.   Eyes: Negative for blurred vision, pain and discharge.  Respiratory: Negative for sputum production, shortness of breath, wheezing and stridor.   Cardiovascular: Negative for chest pain, palpitations, orthopnea and PND.  Gastrointestinal: Negative for abdominal pain, diarrhea, nausea and vomiting.  Genitourinary: Negative for frequency and urgency.  Musculoskeletal: Negative for back pain and joint pain.  Neurological: Negative for sensory change, speech change, focal weakness and weakness.  Psychiatric/Behavioral: Negative for depression and hallucinations. The patient is not nervous/anxious.    DRUG ALLERGIES:   Allergies  Allergen Reactions  . 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 Lipitor [Atorvastatin] Hives and Itching  . Other   . Viagra  [Sildenafil Citrate]     vision loss    VITALS:  Blood pressure (!) 138/96, pulse 89, temperature 98.1 F (36.7 C), temperature source Oral, resp. rate 19, height 6\' 4"  (1.93 m), weight (!) 156 kg, SpO2 100 %.  PHYSICAL EXAMINATION:  Physical Exam   GENERAL:   58 y.o.-year-old patient lying in the bed with no acute distress. Critically ill appearing EYES: Pupils equal, round, reactive to light and accommodation. No scleral icterus.  HEENT: Head atraumatic, normocephalic. Oropharynx and nasopharynx clear.  NECK:  Supple, no jugular venous distention. No thyroid enlargement, no tenderness.  LUNGS: Normal breath sounds bilaterally, no wheezing, rales,rhonchi or crepitation. No use of accessory muscles of respiration.  Decreased bibasilar breath sounds CARDIOVASCULAR: S1, S2 normal. No murmurs, rubs, or gallops. ABDOMEN: Soft, nontender, nondistended. Bowel sounds present. No organomegaly or mass. EXTREMITIES: Swelling of both legs, bilateral knee dressing.  No  cyanosis, or clubbing.  NEUROLOGIC: Cranial nerves II through XII are intact. Moving all extremities well PSYCHIATRIC: patient is alert and oriented x3 SKIN: No obvious rash, lesion, or ulcer.   LABORATORY PANEL:   CBC Recent Labs  Lab 10/05/18 0506  WBC 8.3  HGB 7.8*  HCT 24.3*  PLT 354   ------------------------------------------------------------------------------------------------------------------  Chemistries  Recent Labs  Lab 10/09/18 0407  NA 140  K 3.7  CL 107  CO2 23  GLUCOSE 163*  BUN 24*  CREATININE 2.42*  CALCIUM 8.4*   ------------------------------------------------------------------------------------------------------------------  Cardiac Enzymes No results for input(s): TROPONINI in the last 168 hours. ------------------------------------------------------------------------------------------------------------------  RADIOLOGY:  No results found.  EKG:   Orders placed or performed during the hospital encounter of 09/12/18  . EKG 12-Lead  . EKG 12-Lead  . EKG 12-Lead  . EKG 12-Lead  . EKG 12-Lead  . EKG 12-Lead    ASSESSMENT AND PLAN:  EdwardGradyis a58 y.o.malewith a known history of arthritis, COPD not on home oxygen, hypertension, sleep  apnea, non-insulin-dependent diabetes mellitus presents to hospital secondary to dizziness, weakness and  a fall  * Sepsis Resolved Suspected due to seeding of bilateral knee replacements, s/p recent bilateral knee surgeries, s/p bilateral knee washouts done this admission Fluid Cultures neg - but patient was on antibiotics prior to his washout and sampling Fungal cultures negative - completed 7 days of anidulafungin  Continue daptomycin for 6 more days, Levaquin until 10/23/2018 per ID, SNF unable to take patient due to expensive medications, discussed with case management/social work to see if antibiotics can be done as outpatient  *Acute non - Oliguric renal failure Resolving slowly-creatinine 2.4 down from 2.8 likely ATN from sepsis/hypotension Nephrology input greatly appreciated, s/p hemodialysis 09/22/2018, continue to hold lisinopril/metformin, avoid nephrotoxic agents, strict I&O monitoring, daily weights, BMP daily  * Acute encephalopathy Resolved secondary to metabolic causes, sepsis and uremia S/P delirium tremens for period of time which was treated in the ICU  MRi Brain - Nothing acute Neurology/Dr. 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  *Primary adrenal insufficiency Low cortisol level.  High ACTH level.  No response to cosyntropin dose. 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  Continue Florinef and hydrocortisone Will need outpt F/U with Endocrinology s/p discharge  * Anemia of chronic disease Stable  * Hypokalemia Replete   *Hypomagnesemia Repleted  DVT prophylaxis-on Coumadin  Disposition to: Inpatient rehab when bed is available, 1-2 days  CODE STATUS: Full code  TOTAL TIME TAKING CARE OF THIS PATIENT: 35 minutes.   Avel Peace Zacchaeus Halm M.D on  10/09/2018 at 11:24 AM  Between 7am to 6pm - Pager - [phone]  After 6pm go to [email or web address] - password [email or web address] [place] [facility]  Office  [phone]  CC: Primary care physician; Steele Sizer, MD

## 2018-10-09 NOTE — Progress Notes (Signed)
Pharmacy Antibiotic Note  Isaac Cross is a 58 y.o. male admitted on 09/12/2018 with sepsis.  Pharmacy has been consulted for Daptomycin dosing.  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  He underwent I&D of both knees.  Cultures remain unrevealing but had received antibiotics prior to surgery.  He developed AKI and was requiring HD but Nephrology is hopeful that his kidney function is returned and may not need further HD (UOP improved).    Day #26 antibiotics/Day #10 daptomyicn/Day #4 Levaquin Per ID-plan abx thru 10/31/2018 (4 weeks) per neph note   Plan: 06/22 @ 0700 renal function has improved to CrCl of 52 ml/min will adjust levaquin to 750 mg IV daily and will continue to monitor.   Continue Daptomycin 6mg /kg (per Adj BW for BMI >40) 700mg  IV q24h.  Check CK level in 3 days since had recent CK elevation and dose changed to Q24hr dosing.    Height: 6\' 4"  (193 cm) Weight: (!) 343 lb 14.7 oz (156 kg) IBW/kg (Calculated) : 86.8  Temp (24hrs), Avg:97.9 F (36.6 C), Min:97.5 F (36.4 C), Max:98.5 F (36.9 C)  Recent Labs  Lab 10/03/18 0302 10/04/18 0542 10/05/18 0506 10/06/18 0611 10/07/18 0428 10/08/18 0547 10/09/18 0407  WBC 10.4 10.2  10.4 8.3  --   --   --   --   CREATININE 8.38* 7.51* 6.63* 5.17* 3.86* 2.85* 2.42*    Estimated Creatinine Clearance: 52.6 mL/min (A) (by C-G formula based on SCr of 2.42 mg/dL (H)).    Allergies  Allergen Reactions  . 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 Lipitor [Atorvastatin] Hives and Itching  . Other   . Viagra  [Sildenafil Citrate]     vision loss     Thank you for allowing pharmacy to be a part of this patient's care.  Currie Paris, RPh  10/09/2018

## 2018-10-09 NOTE — Evaluation (Addendum)
Occupational Therapy Re-Evaluation Patient Details Name: Isaac Cross MRN: [id number] DOB: Jun 12, 1960 Today's Date: 10/09/2018    History of Present Illness Pt is a 58 y.o. male presenting to hospital 09/12/18 after rolling OOB landing on L knee; pt with increased dizziness x2 days, fevers x3 days, and generalized weakness.  S/p B TKR 09/01/18 in [place] (pt reports discharging home same day).  Pt admitted with sepsis, acute renal failure, and B TKR.  Pt s/p L knee 5/31 and R knee 6/1 I&D with poly exchange.  Pt noted with severe encephalopathy from DT's with extensive ETOH abuse.  L LE DVT noted.  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  HD started 6/5 with L IJ temporary HD catheter.  PMH includes B TKR 09/01/18 in [place], DM, asthma, R vein surgery, COPD. Right IJ line removed 6/16. 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   Clinical Impression   Pt seen for re-eval and co-tx with PT this date. Pt pleasant, alert, and eager to participate. Follows all commands. Supervision for supine to EOB. Pt denied pain in knees and denied dizziness/lightheadedness throughout session. Pt able to perform grooming tasks seated EOB with set up and no difficulty sequencing task. Appropriate and pleasantly conversing throughout session. Some mild awareness deficits noted. When asked if pt wanted to put his laptop away (pt states he was able to pay some bills on his laptop over the weekend) he stated, I'll keep it out. If they tell me I can get out of here later I'll pack it up before I leave. Pt able to sit EOB from standard height surface to initiate donning of underwear and shorts over feet and pull up while sitting. Pt educated in body positioning and LB dressing strategies to maximize safety at home. Pt verbalized understanding. CGA +2 for completing donning over hips in standing with cue for UE on RW for stability. With +2 for safety/chair follow, pt ambulated with RW and denied pain and  dizziness throughout. BP and PR taken, pt orthostatic but asymptomatic during session. (supine BP unable to get, sit EOB 0 min BP 133/82 PR 111; sit 5 min BP 131/89 PR 113; sit 10 min during ADL BP 122/84 PR 114; stand 0 min BP 103/87 PR 130; standing after ambulating BP 84/60 PR 144. Pt denies symptoms throughout, never appearing symptomatic either. After amb seated BP 106/82 PR 123. Pt left with PT to continue session.   Pt has demonstrated excellent progress towards goals. OT goals updated to reflect progress. Continue to recommend high-intensity skilled OT services in CIR setting to maximize pt's return to PLOF, minimize risk of falls and readmission, and minimize caregiver burden.    Follow Up Recommendations  CIR    Equipment Recommendations  Other (comment)(bari BSC)    Recommendations for Other Services Rehab consult     Precautions / Restrictions Precautions Precaution Comments: watch orthostatics Restrictions Weight Bearing Restrictions: Yes RLE Weight Bearing: Weight bearing as tolerated LLE Weight Bearing: Weight bearing as tolerated      Mobility Bed Mobility Overal bed mobility: Needs Assistance Bed Mobility: Supine to Sit     Supine to sit: Supervision     General bed mobility comments: supervision to sit EOB, no difficulty  Transfers Overall transfer level: Needs assistance Equipment used: Rolling walker (2 wheeled) Transfers: Sit to/from Stand Sit to Stand: Min guard;+2 safety/equipment              Balance Overall balance assessment: Needs assistance Sitting-balance  support: Feet supported Sitting balance-Leahy Scale: Good     Standing balance support: Bilateral upper extremity supported;During functional activity Standing balance-Leahy Scale: Fair                             ADL either performed or assessed with clinical judgement   ADL Overall ADL's : Needs assistance/impaired Eating/Feeding: Set up;Sitting   Grooming: Set  up;Sitting;Supervision/safety;Oral care;Wash/dry face Grooming Details (indicate cue type and reason): performed grooming tasks EOb with set up and supervision, no difficulty sequencing task Upper Body Bathing: Set up;Supervision/ safety;Sitting   Lower Body Bathing: Minimal assistance;Sit to/from stand;+2 for safety/equipment   Upper Body Dressing : Set up;Supervision/safety;Sitting   Lower Body Dressing: Minimal assistance;Set up;Sit to/from stand;+2 for safety/equipment Lower Body Dressing Details (indicate cue type and reason): pt able to sit EOB from standard height surface to initiate donning of underwear and shorts over feet and pull up while sitting. CGA +2 for completing donning over hips in standing with cue for UE on RW for stability Toilet Transfer: Ambulation;Comfort height toilet;Min guard;+2 for safety/equipment;RW           Functional mobility during ADLs: Min guard;+2 for safety/equipment;Rolling walker       Vision Baseline Vision/History: Wears glasses Wears Glasses: At all times Patient Visual Report: No change from baseline       Perception     Praxis      Pertinent Vitals/Pain Pain Assessment: No/denies pain Pain Intervention(s): Monitored during session     Hand Dominance Right   Extremity/Trunk Assessment Upper Extremity Assessment Upper Extremity Assessment: Overall WFL for tasks assessed   Lower Extremity Assessment Lower Extremity Assessment: Defer to PT evaluation;Generalized weakness   Cervical / Trunk Assessment Cervical / Trunk Assessment: Normal   Communication Communication Communication: No difficulties   Cognition Arousal/Alertness: Awake/alert Behavior During Therapy: WFL for tasks assessed/performed Overall Cognitive Status: Impaired/Different from baseline                                 General Comments: follows all commands, appropriately conversive t/o session, some decreased awareness of deficits and sense  of time but cognition appears much improved from previous sessions   General Comments       Exercises     Shoulder Instructions      Home Living Family/patient expects to be discharged to:: Private residence Living Arrangements: Spouse/significant other Available Help at Discharge: Family Type of Home: House Home Access: Stairs to enter Technical brewer of Steps: 2 Entrance Stairs-Rails: Right;Left Home Layout: Two level;Able to live on main level with bedroom/bathroom     Bathroom Shower/Tub: Walk-in shower   Bathroom Toilet: Standard(with toilet riser)     Home Equipment: Environmental consultant - 2 wheels;Toilet riser;Grab bars - tub/shower;Bedside commode;Shower seat - built in;Hand held shower head;Adaptive equipment Adaptive Equipment: Reacher;Long-handled shoe horn;Long-handled sponge        Prior Functioning/Environment Level of Independence: Independent with assistive device(s)        Comments: Ambulatory with RW.  Pt reports doing well s/p B TKR.        OT Problem List: Decreased strength;Pain;Decreased knowledge of precautions;Decreased range of motion;Decreased safety awareness;Cardiopulmonary status limiting activity;Obesity;Decreased activity tolerance;Decreased coordination;Decreased knowledge of use of DME or AE;Impaired UE functional use      OT Treatment/Interventions: Self-care/ADL training;Therapeutic exercise;Patient/family education;Balance training;Energy conservation;Therapeutic activities;DME and/or AE instruction    OT Goals(Current goals  can be found in the care plan section) Acute Rehab OT Goals Patient Stated Goal: to go to CIR and then go home OT Goal Formulation: With patient Time For Goal Achievement: 10/23/18 Potential to Achieve Goals: Good  OT Frequency: Min 3X/week   Barriers to D/C:            Co-evaluation PT/OT/SLP Co-Evaluation/Treatment: Yes Reason for Co-Treatment: For patient/therapist safety;To address functional/ADL  transfers PT goals addressed during session: Mobility/safety with mobility;Proper use of DME;Balance OT goals addressed during session: ADL's and self-care;Proper use of Adaptive equipment and DME      AM-PAC OT "6 Clicks" Daily Activity     Outcome Measure Help from another person eating meals?: None Help from another person taking care of personal grooming?: None Help from another person toileting, which includes using toliet, bedpan, or urinal?: A Little Help from another person bathing (including washing, rinsing, drying)?: A Little Help from another person to put on and taking off regular upper body clothing?: A Little Help from another person to put on and taking off regular lower body clothing?: A Little 6 Click Score: 20   End of Session Equipment Utilized During Treatment: Gait belt;Rolling walker Nurse Communication: Mobility status;Other (comment)(orthostatics)  Activity Tolerance: Patient tolerated treatment well Patient left: in chair;with chair alarm set;with call bell/phone within reach;Other (comment)(with PT to complete session)  OT Visit Diagnosis: Other abnormalities of gait and mobility (R26.89);History of falling (Z91.81)                Time: 2876-8115 OT Time Calculation (min): 44 min Charges:  OT General Charges $OT Visit: 1 Visit OT Evaluation $OT Re-eval: 1 Re-eval OT Treatments $Self Care/Home Management : 8-22 mins  Jeni Salles, MPH, MS, OTR/L ascom [phone] 10/09/18, 11:23 AM

## 2018-10-09 NOTE — Progress Notes (Signed)
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  L DVT Argatroban drip discontinued 10/03/2018  Allergies  Allergen Reactions  . 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 Lipitor [Atorvastatin] Hives and Itching  . Other   . Viagra  [Sildenafil Citrate]     vision loss    Patient Measurements: Height: 6\' 4"  (193 cm) Weight: (!) 343 lb 14.7 oz (156 kg) IBW/kg (Calculated) : 86.8  Vital Signs: Temp: 97.6 F (36.4 C) (06/21 2357) BP: 115/86 (06/21 2357) Pulse Rate: 99 (06/21 2357)  Labs: Recent Labs    10/07/18 0428 10/08/18 0547 10/09/18 0407  LABPROT 24.4* 25.1* 24.8*  INR 2.2* 2.3* 2.3*  CREATININE 3.86* 2.85* 2.42*  CKTOTAL  --  86  --     Estimated Creatinine Clearance: 52.6 mL/min (A) (by C-G formula based on SCr of 2.42 mg/dL (H)).   Medical History: Past Medical History:  Diagnosis Date  . Allergy   . Anxiety   . Arthritis    knees  . Asthma, mild intermittent, well-controlled   . Chronic obstructive asthma ([place])   . COPD (chronic obstructive pulmonary disease) ([place])   . Depression with anxiety   . Diabetes ([place])   . GERD (gastroesophageal reflux disease)   . HBP (high blood pressure)   . High cholesterol   . Renal cyst, right   . Sleep apnea    CPAP  . Swelling     Medications:  Medications Prior to Admission  Medication Sig Dispense Refill Last Dose  . acetaminophen (TYLENOL) 500 MG tablet Take 1 tablet (500 mg total) by mouth every 6 (six) hours as needed. 90 tablet 0 09/12/2018 at 0800  . albuterol (PROVENTIL HFA;VENTOLIN HFA) 108 (90 Base) MCG/ACT inhaler INHALE 2 PUFFS INTO THE LUNGS 4 (FOUR) TIMES DAILY. 6.7 Inhaler 0 09/12/2018 at 0800  . aspirin 81 MG tablet Take 81 mg by mouth daily.   09/12/2018 at 0800  . atenolol (TENORMIN) 25 MG tablet Take 1 tablet (25 mg total) by mouth every evening. 90 tablet 1 09/11/2018 at 2000  . cephALEXin (KEFLEX) 500 MG  capsule Take 500 mg by mouth 3 (three) times daily.   09/12/2018 at 0800  . citalopram (CELEXA) 20 MG tablet Take 1 tablet (20 mg total) by mouth daily. 90 tablet 1 09/12/2018 at 0800  . fluticasone furoate-vilanterol (BREO ELLIPTA) 100-25 MCG/INH AEPB Inhale 1 puff into the lungs daily. 180 each 1 09/12/2018 at 0800  . Multiple Vitamins-Minerals (MENS MULTIVITAMIN PLUS) TABS Take by mouth.   09/12/2018 at 0800  . Omega-3 Fatty Acids (FISH OIL) 1000 MG CAPS Take by mouth daily.   09/12/2018 at 0800  . oxyCODONE-acetaminophen (PERCOCET/ROXICET) 5-325 MG tablet Take 1 tablet by mouth every 4 (four) hours.   09/12/2018 at 0800  . rosuvastatin (CRESTOR) 20 MG tablet TAKE 1 TABLET BY MOUTH EVERY DAY 90 tablet 1 09/12/2018 at 0800  . SENNA-PLUS 8.6-50 MG tablet Take 2 tablets by mouth every morning.   09/12/2018 at 0800  . sulfamethoxazole-trimethoprim (BACTRIM DS) 800-160 MG tablet Take 1 tablet by mouth 2 (two) times a day.   09/12/2018 at 0800  . XARELTO 10 MG TABS tablet Take 10 mg by mouth daily.    09/12/2018 at 0800  . cetirizine (ZYRTEC) 10 MG tablet Take 10 mg by mouth daily.   prn at prn  . diclofenac sodium (VOLTAREN) 1 % GEL APPLY 4 GRAMS TOPICALLY FOUR TIMES A  DAY 100 g 2 prn at prn  . lidocaine (XYLOCAINE) 2 % solution Use as directed 15 mLs in the mouth or throat as needed for mouth pain. 100 mL 0 prn at prn  . triamcinolone cream (KENALOG) 0.1 % triamcinolone acetonide 0.1 % topical cream   prn at prn    Assessment: 58 yr male had B/l knee replacements Sep 01, 2018 was admitted with fever, AKI/ Platelet count has been declining since 6/5. He was started on heparin 6/3. Child Pugh score estimated to be 5-6  HIT lab resulted 6/8 1740 - Abnormal 1.854, ordered SRA  6/16 INR 2.9  Warfarin 5 mg    *Argatroban drip d/c 6/17 INR 2.0  Warfarin 7.5 mg 6/18 INR 2.3  Warfarin 6 mg 6/19 INR 2.1 6 mg 6/20 INR 2.2 6 mg 6/21 INR 2.3  ? 6/22 INR 2.3  Goal of Therapy:  Monitor platelets by  anticoagulation protocol: Yes INR: 2-3   Plan:  Continue warfarin 6mg  Q1800. Will obtain INR daily.   Patient on levofloxacin and daptomycin  Pharmacy will continue to monitor and adjust per consult.   Cambree Hendrix A, RPh 10/09/2018 7:31 AM

## 2018-10-09 NOTE — Progress Notes (Signed)
Date of Admission:  09/12/2018       Subjective: Pt doing well Walked in the corridor with PT Is drinking ensure  Medications:  . aspirin EC  81 mg Oral Daily  . Chlorhexidine Gluconate Cloth  6 each Topical Q0600  . citalopram  10 mg Oral Daily  . feeding supplement (NEPRO CARB STEADY)  237 mL Oral BID BM  . fludrocortisone  0.1 mg Oral Daily  . fluticasone furoate-vilanterol  1 puff Inhalation Daily  . folic acid  1 mg Oral Daily  . hydrocortisone  20 mg Oral BID  . insulin aspart  0-5 Units Subcutaneous QHS  . insulin aspart  0-9 Units Subcutaneous TID WC  . mouth rinse  15 mL Mouth Rinse BID  . multivitamin with minerals  1 tablet Oral Daily  . omega-3 acid ethyl esters  1 g Oral Daily  . sodium chloride flush  3 mL Intravenous Q12H  . warfarin  6 mg Oral q1800  . Warfarin - Pharmacist Dosing Inpatient   Does not apply q1800    Objective: Vital signs in last 24 hours: Temp:  [97.6 F (36.4 C)-98.5 F (36.9 C)] 98.1 F (36.7 C) (06/22 0739) Pulse Rate:  [89-99] 89 (06/22 0739) Resp:  [18-19] 19 (06/21 2357) BP: (115-141)/(86-96) 138/96 (06/22 0739) SpO2:  [100 %] 100 % (06/22 0739)  PHYSICAL EXAM:  General: Alert, cooperative, no distress, appears stated age.  Head: Normocephalic, without obvious abnormality, atraumatic. Eyes: Conjunctivae clear, anicteric sclerae. Pupils are equal ENT Nares normal. No drainage or sinus tenderness. Lips, mucosa, and tongue normal. No Thrush Neck: Supple, symmetrical, no adenopathy, thyroid: non tender no carotid bruit and no JVD. Back: No CVA tenderness. Lungs: Clear to auscultation bilaterally. No Wheezing or Rhonchi. No rales. Heart: Regular rate and rhythm, no murmur, rub or gallop. Abdomen: Soft, non-tender,not distended. Bowel sounds normal. No masses Extremities: b/l knee swellig improving Skin: No rashes or lesions. Or bruising Lymph: Cervical, supraclavicular normal. Neurologic: Grossly non-focal  Lab Results  Recent Labs    10/08/18 0547 10/09/18 0407  NA 137 140  K 3.4* 3.7  CL 105 107  CO2 22 23  BUN 26* 24*  CREATININE 2.85* 2.42*   Immunology and other work up for fever ANA 09/12/18-& 09/19/18 =Neg ACE level 09/19/18 NEg Complement c4/C3 Neg 09/28/18 ANCA neg 09/28/18 Beta D glucan 09/20/18-Neg CK 09/13/18 was 4516 CK 10/02/18 is 114 TSH 5.055 on 09/15/18 Cortisol on 09/20/18 was 18.4 ( 4 pm) ( this was done after he got a dose of cortisol 50mg  at noon that day) Quantiferon Gold on 09/21/18 indeterminate QG from 10/03/18 Negative Hepatitis C from 09/21/18 NR Hepatitis A and B neg  HIV NR 09/20/18   ACTH level from 10/04/18 ( 3pm) is 445( normal upto  63)    Microbiology: [facility] 09/12/18-NG 09/19/18 BC NG 6/3 fungal culturebloodneg  Synovial fluidculture left5/29/20 Neg Synovial fluid rt knee NG from 09/16/18 Synovial tissue cultureleft knee5/31NG NGS sent to [place] ( 16s RNA) neg AFB/FUNGAL from left synovial tissue negative from 09/17/18 so far  6/16 Blood Culture neg so far   Studies/Results:         Assessment/Plan: 58 yr male had B/l knee replacements Sep 01, 2018 was admitted with fever, AKI . He was taking bactrim and keflex when he came to the Ed on 5/26/20with fever  Pt was in septic shock and in the beginning the left knee as the source was questioned  Initially feverswere persisting  inspite of IV vanco and cefepime and there was no other source (Blood culture neg, urine culture neg, no pneumonia, ) the left knee was aspirated and it had 8000 wbc and he was taken for a wash out on 09/17/18. Rt knee was washed out on 09/18/18 All cultures were neg Pts fever was down for 36-48 hrs and started up again on 09/19/18 to  103. Central line, femoral line were removed Cefepime was changed to zosyn on 6/2 Vanco was discontinued on 09/19/18 due to worsening renal failure-added anidulafungin6/3and doxy 6/3- - he also was started on stress  dose steroids on the same day and was also diagnosed with left popliteal DVT on 09/20/18 and started on heparin- Asfungal blood culture negDC anidulafungin0n 09/26/18 Doxy Dc on 09/26/18 Hydrocortisone  Dc on 6/8 before he was transferred to the floor from ICU  He remained afebrile from 6/4-6/11 and started spiking again pn 6/13 ceftazidime changed ot levaquin questioning drug fever Started with  a 2nd wave  of fever, altered mental status, hypotension ( more orthostasis) , weakness,   Serum cortisol AM was sent on 6/17 and was low at 5.6, , ACTH done on 6/17 was very high at 443, Cosyntropin test  Done on 10/05/18 was positive with no response,  pt was started on steroids and fludrocortisone on 10/05/18. CT scan of the abdomen to check for cause of fever done on 10/04/18 showed b/l adrenal mass. As concern for b/l adrenal hemorrage/infarction causing crisis MRI was done ( without contrast) and it showed b/l adrenal mass   Pt has primary adrenal insufficiency causing adrenal crisis ( high ACTH and low cortisol and no response to cosyntropin test and MRI showing b/l adrenal mass )   the cause of which could a) b/l adrenal hematoma /hemorrhage causing adrenal crisis   B)Adrenal metastasis/malignancy   C) unlikely this is infiltrative adrenal disease  Due to  TB/HIV/histoplasma as all tests are negative  Adenoma even if b/l should not cause adrenal insufficiency  Adrenal hyperplasia may cause excess secretion of cortisol and not deficiency  If this is b/l adrenal Hage induced adrenal crisis, was it because of septic shock  precipitated by Xarelto DOAC or did he have adrenal hematoma/hemorrhage primarily from [place] and  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 and caused all his symptoms of fever, shock , volume contraction ( AKI) etc He is currently on hydrocortisone 10mg  BID and fludorocortisone   He needs an endocrine consult  He will need MRI with contrast once cr normalizes to look at the  adrenals He will need long term/indefinite steroid ------------------------------------------------------------------  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 - WBC from synovial was only 8k and culture ( bacterial, fungal and Afb) nega, also molecular testing of the synovial tissue sent to [facility] for next generation sequencing did not show any bacteria He has completed 3 weeks of Iv from the wash out- Daptomycin can be Dc on 6/27 ( 4 weeks of Iv treatment) and  PO levaquin to be continued for another 14 days  Encephalopathy-resolved Combination of DT, AKI, and adrenal crisis  AKI-was on dialsis starting 09/22/18- for a few days- none since 6/11 Crimproved significantly     Left DVT  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

## 2018-10-09 NOTE — Telephone Encounter (Signed)
Copied from [place] [id number]. Topic: General - Other >> Oct 09, 2018 12:31 PM Leward Quan A wrote: Reason for CRM: Patient wife Rocko Fesperman  called to say that her husband have been in the hospital at [facility] for some time now and she will be needing to take some time off work to stay with him at home while he recover. She will be faxing over some FMLA paper work for Dr Ancil Boozer to complete. She also stated that patient is asking if Dr Ancil Boozer can please come visit him at the hospital Rm 142 and call back number is Ph# [phone]

## 2018-10-09 NOTE — Progress Notes (Signed)
Inpatient Rehabilitation-Admissions Coordinator   [facility] spoke with pt via phone regarding CIR program. Pt very adamant that he did not want to pursue CIR and that he just wants to go home. He did allow AC to follow up with his wife regarding his preference.   This note completed by the Inpatient Rehab-Admit Coordinator, not Waylan Boga

## 2018-10-09 NOTE — Progress Notes (Signed)
Inpatient Rehabilitation-Admissions Coordinator   [facility] spoke with pt regarding rehab venues. Pt wants to return home and has no interest in CIR at this time. With permission, [facility] spoke with his wife regarding his preference. She was going to speak with him later on but appeared open to having him come straight home.   AC received voicemail from [employer] that pt's wife would like for him to return home.   AC will sign off.   Please call if questions.   Isaac Cross, OTR/L  Rehab Admissions Coordinator  [phone] 10/09/2018 2:44 PM

## 2018-10-10 LAB — GLUCOSE, CAPILLARY
Glucose-Capillary: 108 mg/dL — ABNORMAL HIGH (ref 70–99)
Glucose-Capillary: 158 mg/dL — ABNORMAL HIGH (ref 70–99)
Glucose-Capillary: 169 mg/dL — ABNORMAL HIGH (ref 70–99)
Glucose-Capillary: 115 mg/dL — ABNORMAL HIGH (ref 70–99)

## 2018-10-10 LAB — PROTIME-INR
INR: 2 — ABNORMAL HIGH (ref 0.8–1.2)
Prothrombin Time: 22.4 seconds — ABNORMAL HIGH (ref 11.4–15.2)

## 2018-10-10 MED ORDER — EPOETIN ALFA 10000 UNIT/ML IJ SOLN
20000.0000 [IU] | INTRAMUSCULAR | Status: DC
  Administered 2018-10-10: 20000 [IU] via INTRAVENOUS
  Filled 2018-10-10: qty 2

## 2018-10-10 MED ORDER — EPOETIN ALFA 40000 UNIT/ML IJ SOLN
20000.0000 [IU] | INTRAMUSCULAR | Status: DC
  Filled 2018-10-10: qty 1

## 2018-10-10 MED ORDER — ENSURE ENLIVE PO LIQD
237.0000 mL | Freq: Two times a day (BID) | ORAL | Status: DC
  Administered 2018-10-10 – 2018-10-12 (×4): 237 mL via ORAL

## 2018-10-10 NOTE — Progress Notes (Signed)
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  L DVT Argatroban drip discontinued 10/03/2018  Allergies  Allergen Reactions  . 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 Lipitor [Atorvastatin] Hives and Itching  . Other   . Viagra  [Sildenafil Citrate]     vision loss    Patient Measurements: Height: 6\' 4"  (193 cm) Weight: (!) 343 lb 14.7 oz (156 kg) IBW/kg (Calculated) : 86.8  Vital Signs: Temp: 98.4 F (36.9 C) (06/23 0734) Temp Source: Oral (06/23 0734) BP: 135/93 (06/23 0734) Pulse Rate: 98 (06/23 0734)  Labs: Recent Labs    10/08/18 0547 10/09/18 0407 10/10/18 0549  LABPROT 25.1* 24.8* 22.4*  INR 2.3* 2.3* 2.0*  CREATININE 2.85* 2.42*  --   CKTOTAL 86  --   --     Estimated Creatinine Clearance: 52.6 mL/min (A) (by C-G formula based on SCr of 2.42 mg/dL (H)).   Medical History: Past Medical History:  Diagnosis Date  . Allergy   . Anxiety   . Arthritis    knees  . Asthma, mild intermittent, well-controlled   . Chronic obstructive asthma ([place])   . COPD (chronic obstructive pulmonary disease) ([place])   . Depression with anxiety   . Diabetes ([place])   . GERD (gastroesophageal reflux disease)   . HBP (high blood pressure)   . High cholesterol   . Renal cyst, right   . Sleep apnea    CPAP  . Swelling     Medications:  Medications Prior to Admission  Medication Sig Dispense Refill Last Dose  . acetaminophen (TYLENOL) 500 MG tablet Take 1 tablet (500 mg total) by mouth every 6 (six) hours as needed. 90 tablet 0 09/12/2018 at 0800  . albuterol (PROVENTIL HFA;VENTOLIN HFA) 108 (90 Base) MCG/ACT inhaler INHALE 2 PUFFS INTO THE LUNGS 4 (FOUR) TIMES DAILY. 6.7 Inhaler 0 09/12/2018 at 0800  . aspirin 81 MG tablet Take 81 mg by mouth daily.   09/12/2018 at 0800  . atenolol (TENORMIN) 25 MG tablet Take 1 tablet (25 mg total) by mouth every evening. 90 tablet 1 09/11/2018 at 2000  .  cephALEXin (KEFLEX) 500 MG capsule Take 500 mg by mouth 3 (three) times daily.   09/12/2018 at 0800  . citalopram (CELEXA) 20 MG tablet Take 1 tablet (20 mg total) by mouth daily. 90 tablet 1 09/12/2018 at 0800  . fluticasone furoate-vilanterol (BREO ELLIPTA) 100-25 MCG/INH AEPB Inhale 1 puff into the lungs daily. 180 each 1 09/12/2018 at 0800  . Multiple Vitamins-Minerals (MENS MULTIVITAMIN PLUS) TABS Take by mouth.   09/12/2018 at 0800  . Omega-3 Fatty Acids (FISH OIL) 1000 MG CAPS Take by mouth daily.   09/12/2018 at 0800  . oxyCODONE-acetaminophen (PERCOCET/ROXICET) 5-325 MG tablet Take 1 tablet by mouth every 4 (four) hours.   09/12/2018 at 0800  . rosuvastatin (CRESTOR) 20 MG tablet TAKE 1 TABLET BY MOUTH EVERY DAY 90 tablet 1 09/12/2018 at 0800  . SENNA-PLUS 8.6-50 MG tablet Take 2 tablets by mouth every morning.   09/12/2018 at 0800  . sulfamethoxazole-trimethoprim (BACTRIM DS) 800-160 MG tablet Take 1 tablet by mouth 2 (two) times a day.   09/12/2018 at 0800  . XARELTO 10 MG TABS tablet Take 10 mg by mouth daily.    09/12/2018 at 0800  . cetirizine (ZYRTEC) 10 MG tablet Take 10 mg by mouth daily.   prn at prn  . diclofenac sodium (VOLTAREN) 1 % GEL  APPLY 4 GRAMS TOPICALLY FOUR TIMES A DAY 100 g 2 prn at prn  . lidocaine (XYLOCAINE) 2 % solution Use as directed 15 mLs in the mouth or throat as needed for mouth pain. 100 mL 0 prn at prn  . triamcinolone cream (KENALOG) 0.1 % triamcinolone acetonide 0.1 % topical cream   prn at prn    Assessment: 58 yr male had B/l knee replacements Sep 01, 2018 was admitted with fever, AKI/ Platelet count has been declining since 6/5. He was started on heparin 6/3. 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 6/22 INR 2.3 6 mg 6/23 INR  2.0  Goal of Therapy:  Monitor platelets by anticoagulation protocol: Yes INR: 2-3   Plan:  Continue warfarin 6 mg daily. Will obtain INR daily.   Patient on levofloxacin and daptomycin Pharmacy will continue to monitor and adjust per consult.   Christle Nolting A, RPh 10/10/2018 8:32 AM

## 2018-10-10 NOTE — Progress Notes (Signed)
OT Cancellation Note  Patient Details Name: Isaac Cross MRN: [id number] DOB: Apr 01, 1961   Cancelled Treatment:    Reason Eval/Treat Not Completed: Other (comment). Pt meeting with chaplain. Will re-attempt OT treatment at later date/time as pt is available and appropriate.   Jeni Salles, MPH, MS, OTR/L ascom [phone] 10/10/18, 3:04 PM

## 2018-10-10 NOTE — TOC Progression Note (Signed)
Transition of Care [facility]) - Progression Note    Patient Details  Name: RAISTLIN GUM MRN: [id number] Date of Birth: 1960-12-27  [facility] CM/SW Contact  Gadge Hermiz, Lenice Llamas Phone Number: [phone]  10/10/2018, 1:40 PM  Clinical Narrative: Per MD patient may be able to change to PO ABX in a few days. Plan is for patient to D/C on PO ABX once he is finished with his IV ABX course. Lauretta Chester home health representative is aware of above. [facility] representative is aware of above. Patient's wife [name] is aware of above. CSW will continue to follow and assist as needed.     Expected Discharge Plan: [place] Barriers to Discharge: Waiting for outpatient dialysis, Other (comment)(acute renal currently)  Expected Discharge Plan and Services Expected Discharge Plan: [place] In-house Referral: Clinical Social Work Discharge Planning Services: CM Consult Post Acute Care Choice: [place] arrangements for the past 2 months: [place]: (Patient stated that he is open to home health. ) Date [place]: 09/18/18 Time [place]: [place] Representative spoke with at [place]: [name]- unsure of when patient will discharge.   Social Determinants of Health (SDOH) Interventions    Readmission Risk Interventions No flowsheet data found.

## 2018-10-10 NOTE — Progress Notes (Signed)
Nutrition Follow-up  RD working remotely.  DOCUMENTATION CODES:   Obesity unspecified  INTERVENTION:  Will switch to Ensure Enlive po BID, each supplement provides 350 kcal and 20 grams of protein.  Continue daily MVI.  NUTRITION DIAGNOSIS:   Inadequate oral intake related to acute illness as evidenced by other (comment)(per chart review ).  Resolving- PO intake is improving.  GOAL:   Patient will meet greater than or equal to 90% of their needs  Progressing.  MONITOR:   PO intake, Supplement acceptance, Labs, Weight trends, I & O's, Skin  REASON FOR ASSESSMENT:   LOS    ASSESSMENT:   58 y.o. black male with hyeprtension, COPD, diabetes mellitus type II who underwent bilateral knee replacement surgeries last week. who was admitted to [facility] on 09/12/2018 for sepsis and ARF  Spoke with patient over the phone. He reports his appetite is much better but is still not quite back to normal. Per chart he is now finishing 80-100% of his meals. He is still drinking Nepro but reports he prefers Ensure. Patient reports he will be discharging home soon - possibly this afternoon. Noted dialysis access is removed and he will not require any further dialysis.   Medications reviewed and include: folic acid 1 m gdaily, Cortef 20 mg BID, Novolog 0-9 units TID, Novolog 0-5 units QHS, Levaquin 500 mg daily, MVI daily, Lovaza 1 gram daily, warfarin, Daptomycin.  Labs reviewed: CBG 85-122.  Diet Order:   Diet Order            Diet Carb Modified Fluid consistency: Thin; Room service appropriate? Yes  Diet effective now             EDUCATION NEEDS:   Not appropriate for education at this time  Skin:  Skin Assessment: Skin Integrity Issues:(incisions to bilateral knees)  Last BM:  10/08/2018 - large type 6  Height:   Ht Readings from Last 1 Encounters:  09/12/18 6\' 4"  (1.93 m)   Weight:   Wt Readings from Last 1 Encounters:  09/29/18 (!) 156 kg   Ideal Body Weight:  91.8  kg  BMI:  Body mass index is 41.86 kg/m.  Estimated Nutritional Needs:   Kcal:  2700-3000kcal/day   Protein:  >150g/day   Fluid:  >2.7L/day   Willey Blade, MS, RD, LDN Office: [phone] Pager: [phone] After Hours/Weekend Pager: [phone]

## 2018-10-10 NOTE — Progress Notes (Signed)
Physical Therapy Treatment Patient Details Name: Isaac Cross MRN: [id number] DOB: [birth year] Today's Date: 10/10/2018    History of Present Illness Pt is a 58 y.o. male presenting to hospital 09/12/18 after rolling OOB landing on L knee; pt with increased dizziness x2 days, fevers x3 days, and generalized weakness.  S/p B TKR 09/01/18 in [place] (pt reports discharging home same day).  Pt admitted with sepsis, acute renal failure, and B TKR.  Pt s/p L knee 5/31 and R knee 6/1 I&D with poly exchange.  Pt noted with severe encephalopathy from DT's with extensive ETOH abuse.  L LE DVT noted.  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  HD started 6/5 with L IJ temporary HD catheter.  PMH includes B TKR 09/01/18 in [place], DM, asthma, R vein surgery, COPD. Right IJ line removed 6/16. 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    PT Comments    Patient potentially planning for discharge home this date (adamantly refusing CIR despite encouragement), eager for discharge as able.  Disappointed in plan for therapy this date, but agreeable with encouragement from therapist (and reviewing importance of trialing stairs).  Patient continues to make improvements in overall functional performance-decreased level of assist, progressive activity tolerance, initiating stair training--requiring no greater than cga/min assist +2 throughout session.  Does continue to require increased time for task initiation and processing throughout session with patient stating "I'll do it in my own time".  Reviewed all mobility needs, safety recommendations (activity pacing, orthostatic management), with patient voicing understanding; will continue to reinforce as needed.  Of note, patient remains orthostatic with transition to upright, though generally asymptomatic.  Limited recovery with accommodation to position or with introduction of activity with upright positioning.  MD informed/aware.     Follow Up  Recommendations  CIR(if continues to decline, rec HHPT/OT/RN/aide)     Equipment Recommendations       Recommendations for Other Services       Precautions / Restrictions Precautions Precautions: Fall Restrictions Weight Bearing Restrictions: Yes RLE Weight Bearing: Weight bearing as tolerated LLE Weight Bearing: Weight bearing as tolerated    Mobility  Bed Mobility Overal bed mobility: Modified Independent Bed Mobility: Supine to Sit;Sit to Supine           General bed mobility comments: using bilat UEs to self-assist LEs in/out of bed as needed  Transfers Overall transfer level: Needs assistance Equipment used: Rolling walker (2 wheeled) Transfers: Sit to/from Stand Sit to Stand: Min guard;+2 safety/equipment         General transfer comment: heavy use of UEs to assist with lift off and eccentric control with stand to sit; improved foot positioning and functional flexion of knees with movement transition this date  Ambulation/Gait Ambulation/Gait assistance: Min guard Gait Distance (Feet): (40' x2) Assistive device: Rolling walker (2 wheeled)       General Gait Details: Broad BOS with initial sit/stand, but normalized with continued gait distances.  Bilat toe in throughout gait cycle, reports this is baseline for him.  Fair walker position, management; fair postural control.  distance limited by fatigue; remains orthostatic with transition to upright (though denies symptoms); limited awareness of need for activity pacing and intermttent rest periods to manage.   Stairs Stairs: Yes Stairs assistance: Min guard;+2 safety/equipment;Min assist Stair Management: Two rails;One rail Right Number of Stairs: (2-4) General stair comments: up/down 2 steps with bilat rails, cga/min assist +2, ascending foward, descending backward; up/down 4 steps with bilat UEs  on single (L ascending) rail, cga/min assist +2, ascending/descending forward.  Self-selects ascending with L  LE, descending with L LE. Min cuing for slight flexion of support LE during eccentric lowering to prevent buckling.  Fair/good stability of bilat LEs in modified SLS   Wheelchair Mobility    Modified Rankin (Stroke Patients Only)       Balance Overall balance assessment: Needs assistance Sitting-balance support: No upper extremity supported;Feet supported Sitting balance-Leahy Scale: Good     Standing balance support: Bilateral upper extremity supported Standing balance-Leahy Scale: Fair                              Cognition Arousal/Alertness: Awake/alert Behavior During Therapy: WFL for tasks assessed/performed Overall Cognitive Status: Within Functional Limits for tasks assessed                                 General Comments: remains delayed in processing and response time, both verbally and physically      Exercises Other Exercises Other Exercises: Sit/stand from various surface heights (standard bed, recliner), cga/close sup; requires bilat UE support, heavy use of momentum; improved foot placement and bila tknee flexion with transition Other Exercises: Verbally reviewed technique for car transfer, as well as orthostatic precautions with mobility; patient voiced undersatnding of information, though question full comprehension. Other Exercises: upper body dressing, set up/sup; lower body dressing, threads pants over LEs with sup, sit/stand and standing balance to pull pants over hips, cga/min assist.  Somewhat impulsive at times, but good effort; continues to require increased time to process and initiate all tasks.    General Comments        Pertinent Vitals/Pain Pain Assessment: No/denies pain    Home Living                      Prior Function            PT Goals (current goals can now be found in the care plan section) Acute Rehab PT Goals Patient Stated Goal: to go to CIR and then go home PT Goal Formulation: With patient  Time For Goal Achievement: 10/23/18 Potential to Achieve Goals: Good Progress towards PT goals: Progressing toward goals    Frequency    7X/week      PT Plan Current plan remains appropriate    Co-evaluation              AM-PAC PT "6 Clicks" Mobility   Outcome Measure  Help needed turning from your back to your side while in a flat bed without using bedrails?: None Help needed moving from lying on your back to sitting on the side of a flat bed without using bedrails?: None Help needed moving to and from a bed to a chair (including a wheelchair)?: A Little Help needed standing up from a chair using your arms (e.g., wheelchair or bedside chair)?: A Little Help needed to walk in hospital room?: A Little Help needed climbing 3-5 steps with a railing? : A Little 6 Click Score: 20    End of Session Equipment Utilized During Treatment: Gait belt Activity Tolerance: Patient tolerated treatment well Patient left: in bed;with call bell/phone within reach;with bed alarm set Nurse Communication: Mobility status PT Visit Diagnosis: Other abnormalities of gait and mobility (R26.89);Muscle weakness (generalized) (M62.81);Difficulty in walking, not elsewhere classified (R26.2)  Time: 9509-3267 PT Time Calculation (min) (ACUTE ONLY): 44 min  Charges:  $Gait Training: 23-37 mins $Therapeutic Activity: 8-22 mins                     Aranda Bihm H. Owens Shark, PT, DPT, NCS 10/10/18, 11:40 AM [phone]

## 2018-10-10 NOTE — Progress Notes (Signed)
   10/10/18 1500  Clinical Encounter Type  Visited With Patient  Visit Type Follow-up  Referral From Social work  Consult/Referral To [employer] following up with the patient from earlier referral from Social Work. Upon arrival, the patient was pleasant, smiling and in good spirits. He welcomed me in saying, "they railroaded me, chaplain." He expressed disappointment that he was scheduled to go home yesterday, but things have not been pushed back until Friday. The patient expressed excitement that he will soon be able to see his wife, adult children, grandchildren, and his dog. He looks forward to being able to get back to work and assume some semblance of normalcy. Today, we talked at length about the patient's children, his love of parenting, hopes and plans he has for the remainder of the year, the state of the world, and the legacy and lessons he hopes to pass on to his granddchilren. Chaplain provided support in the form of active and reflective listening and compassionate presence.

## 2018-10-10 NOTE — Progress Notes (Signed)
[facility]  ROUNDING NOTE   Subjective:   Laying in the bed. No complaints. UOP 3620 No new labs today Able to eat without nausea or vomiting No shortness of breath  Objective:  Vital signs in last 24 hours:  Temp:  [97.8 F (36.6 C)-98.8 F (37.1 C)] 98.4 F (36.9 C) (06/23 0734) Pulse Rate:  [98-107] 98 (06/23 0734) Resp:  [18] 18 (06/22 2312) BP: (135-147)/(88-93) 135/93 (06/23 0734) SpO2:  [98 %-100 %] 98 % (06/23 0734)  Weight change:  Filed Weights   09/29/18 0630 09/29/18 1445 09/29/18 1823  Weight: (!) 157.1 kg (!) 157 kg (!) 156 kg    Intake/Output: I/O last 3 completed shifts: In: 3852.1 [P.O.:684; I.V.:446.9; NG/GT:2607; IV Piggyback:114.2] Out: 1610 [Urine:3620]   Intake/Output this shift:  Total I/O In: 228 [IV Piggyback:228] Out: 700 [Urine:700]  Physical Exam: General: NAD, laying in the bed  Head: Moist oral mucosal membranes  Eyes: Anicteric   Lungs:  Clear, room air  Heart: regular  Abdomen:  Soft, nontender, obese   Extremities: Trace to 1 + peripheral edema  Neurologic: Awake, alert, conversant  Skin: warm        Basic Metabolic Panel: Recent Labs  Lab 10/05/18 0506 10/06/18 0611 10/07/18 0428 10/08/18 0547 10/09/18 0407  NA 137 135 137 137 140  K 3.1* 3.6 3.4* 3.4* 3.7  CL 103 101 104 105 107  CO2 21* 25 19* 22 23  GLUCOSE 89 137* 153* 159* 163*  BUN 35* 31* 28* 26* 24*  CREATININE 6.63* 5.17* 3.86* 2.85* 2.42*  CALCIUM 8.3* 8.2* 8.4* 8.3* 8.4*    Liver Function Tests: No results for input(s): AST, ALT, ALKPHOS, BILITOT, PROT, ALBUMIN in the last 168 hours. No results for input(s): LIPASE, AMYLASE in the last 168 hours. No results for input(s): AMMONIA in the last 168 hours.  CBC: Recent Labs  Lab 10/04/18 0542 10/05/18 0506  WBC 10.2  10.4 8.3  NEUTROABS 5.7 4.3  HGB 8.2*  8.2* 7.8*  HCT 26.6*  25.9* 24.3*  MCV 85.0  82.5 82.1  PLT 472*  450* 354    Cardiac Enzymes: Recent Labs  Lab  10/05/18 0506 10/08/18 0547  CKTOTAL 92 86    BNP: Invalid input(s): POCBNP  CBG: Recent Labs  Lab 10/09/18 1151 10/09/18 1709 10/09/18 2022 10/10/18 0735 10/10/18 1140  GLUCAP 112* 196* 85 108* 158*    Microbiology: Results for orders placed or performed during the hospital encounter of 09/12/18  Blood Culture (routine x 2)     Status: None   Collection Time: 09/12/18  9:56 AM   Specimen: BLOOD LEFT HAND  Result Value Ref Range Status   Specimen Description BLOOD LEFT HAND  Final   Special Requests   Final    BOTTLES DRAWN AEROBIC AND ANAEROBIC Blood Culture adequate volume   Culture   Final    NO GROWTH 5 DAYS Performed at [facility], [address], [place], [place] [address]    Report Status 09/17/2018 FINAL  Final  Urine culture     Status: None   Collection Time: 09/12/18 10:01 AM   Specimen: Urine, Random  Result Value Ref Range Status   Specimen Description   Final    URINE, RANDOM Performed at [facility], [address], [place], [place] [address]    Special Requests   Final    NONE Performed at [facility], [address], [place], [place] [address]    Culture  Final    NO GROWTH Performed at [facility], [place] [address] [place], [place] [address]    Report Status 09/13/2018 FINAL  Final  SARS Coronavirus 2 (CEPHEID- Performed in [place] hospital lab), Hosp Order     Status: None   Collection Time: 09/12/18 10:02 AM   Specimen: Nasopharyngeal Swab  Result Value Ref Range Status   SARS Coronavirus 2 NEGATIVE NEGATIVE Final    Comment: (NOTE) If result is NEGATIVE SARS-CoV-2 target nucleic acids are NOT DETECTED. The SARS-CoV-2 RNA is generally detectable in upper and lower  respiratory specimens during the acute phase of infection. The lowest  concentration of SARS-CoV-2 viral copies this assay can detect is 250  copies / mL. A negative result does not preclude SARS-CoV-2 infection  and should not  be used as the sole basis for treatment or other  patient management decisions.  A negative result may occur with  improper specimen collection / handling, submission of specimen other  than nasopharyngeal swab, presence of viral mutation(s) within the  areas targeted by this assay, and inadequate number of viral copies  (<250 copies / mL). A negative result must be combined with clinical  observations, patient history, and epidemiological information. If result is POSITIVE SARS-CoV-2 target nucleic acids are DETECTED. The SARS-CoV-2 RNA is generally detectable in upper and lower  respiratory specimens dur ing the acute phase of infection.  Positive  results are indicative of active infection with SARS-CoV-2.  Clinical  correlation with patient history and other diagnostic information is  necessary to determine patient infection status.  Positive results do  not rule out bacterial infection or co-infection with other viruses. If result is PRESUMPTIVE POSTIVE SARS-CoV-2 nucleic acids MAY BE PRESENT.   A presumptive positive result was obtained on the submitted specimen  and confirmed on repeat testing.  While 2019 novel coronavirus  (SARS-CoV-2) nucleic acids may be present in the submitted sample  additional confirmatory testing may be necessary for epidemiological  and / or clinical management purposes  to differentiate between  SARS-CoV-2 and other Sarbecovirus currently known to infect humans.  If clinically indicated additional testing with an alternate test  methodology [phone]) is advised. The SARS-CoV-2 RNA is generally  detectable in upper and lower respiratory sp ecimens during the acute  phase of infection. The expected result is Negative. Fact Sheet for Patients:  StrictlyIdeas.no Fact Sheet for Healthcare Providers: BankingDealers.co.za This test is not yet approved or cleared by the [place] FDA and has been authorized  for detection and/or diagnosis of SARS-CoV-2 by FDA under an Emergency Use Authorization (EUA).  This EUA will remain in effect (meaning this test can be used) for the duration of the COVID-19 declaration under Section 564(b)(1) of the Act, 21 U.S.C. section 360bbb-3(b)(1), unless the authorization is terminated or revoked sooner. Performed at [facility], [address] [place], [place] [address]   Blood Culture (routine x 2)     Status: None   Collection Time: 09/12/18 10:06 AM   Specimen: BLOOD  Result Value Ref Range Status   Specimen Description BLOOD LEFT ANTECUBITAL  Final   Special Requests   Final    BOTTLES DRAWN AEROBIC AND ANAEROBIC Blood Culture adequate volume   Culture   Final    NO GROWTH 5 DAYS Performed at [facility], [address], [place], [place] [address]    Report Status 09/17/2018 FINAL  Final  MRSA PCR Screening     Status: None  Collection Time: 09/12/18  7:04 PM   Specimen: Nasal Mucosa; Nasopharyngeal  Result Value Ref Range Status   MRSA by PCR NEGATIVE NEGATIVE Final    Comment:        The GeneXpert MRSA Assay (FDA approved for NASAL specimens only), is one component of a comprehensive MRSA colonization surveillance program. It is not intended to diagnose MRSA infection nor to guide or monitor treatment for MRSA infections. Performed at [facility], [address] [place], [place] [address]   C difficile quick scan w PCR reflex     Status: None   Collection Time: 09/14/18  2:21 PM   Specimen: STOOL  Result Value Ref Range Status   C Diff antigen NEGATIVE NEGATIVE Final   C Diff toxin NEGATIVE NEGATIVE Final   C Diff interpretation No C. difficile detected.  Final    Comment: Performed at [facility], [address] [place], [place] [address]  Group A Strep by PCR     Status: None   Collection Time: 09/15/18  1:53 PM  Result Value Ref Range Status   Group A Strep by PCR NOT DETECTED NOT  DETECTED Final    Comment: Performed at [facility], [address] [place], [place] [address]  Body fluid culture     Status: None   Collection Time: 09/15/18  5:13 PM   Specimen: KNEE  Result Value Ref Range Status   Specimen Description   Final    KNEE Performed at [facility], [address], [place], [place] [address]    Special Requests   Final    NONE Performed at [facility], [address], [place], [place] [address]    Gram Stain   Final    RARE RBCS FEW WBC SEEN NO ORGANISMS SEEN Performed at [facility], [address], [place], [place] [address]    Culture   Final    NO GROWTH 3 DAYS Performed at [facility], [place] [address], [place], [place] [address]    Report Status 09/19/2018 FINAL  Final  Body fluid culture     Status: None   Collection Time: 09/16/18  8:55 AM   Specimen: KNEE  Result Value Ref Range Status   Specimen Description KNEE RIGHT  Final   Special Requests NONE  Final   Gram Stain   Final    RARE WBC PRESENT, PREDOMINANTLY PMN NO ORGANISMS SEEN    Culture   Final    NO GROWTH 3 DAYS Performed at [facility], [place] [address] [place], [place] [address]    Report Status 09/20/2018 FINAL  Final  Aerobic/Anaerobic Culture (surgical/deep wound)     Status: None   Collection Time: 09/17/18 11:32 AM   Specimen: Wound  Result Value Ref Range Status   Specimen Description   Final    TISSUE LEFT KNEE 1 Performed at [facility], [address] [place], [place] [address]    Special Requests PATIENT ON FOLLOWING MAXIPIME VANCOMYCIN  Final   Gram Stain   Final    FEW WBC PRESENT,BOTH PMN AND MONONUCLEAR NO ORGANISMS SEEN    Culture   Final    No growth aerobically or anaerobically. Performed at [facility], [place] [address] [place], [place] [address]    Report Status 09/22/2018 FINAL  Final  Acid Fast Smear (AFB)     Status: None   Collection Time: 09/17/18 11:32 AM    Specimen:  Synovium  Result Value Ref Range Status   AFB Specimen Processing Comment  Final    Comment: Tissue Grinding and Digestion/Decontamination   Acid Fast Smear Negative  Final    Comment: (NOTE) Performed At: [facility] [place], [place] [id number] Rush Farmer MD PZ:[id number]    Source (AFB) TISSUE  Final    Comment: LEFT KNEE Performed at [facility], [place] [address] [place], [place] [address]   Culture, fungus without smear     Status: None (Preliminary result)   Collection Time: 09/17/18 11:34 AM   Specimen: Synovium; Other  Result Value Ref Range Status   Specimen Description   Final    SYNOVIAL Performed at [facility], [address] [place], [place] [address]    Special Requests   Final    NONE Performed at [facility], [address], [place], [place] [address]    Culture   Final    NO FUNGUS ISOLATED AFTER 14 DAYS Performed at [facility], [place] [address], [place], [place] [address]    Report Status PENDING  Incomplete  Acid Fast Culture with reflexed sensitivities     Status: None   Collection Time: 09/17/18 11:36 AM   Specimen: Synovium  Result Value Ref Range Status   Acid Fast Culture CANC  Final    Comment: (NOTE) Test cancelled at client's request.      Cancelled per Levin Erp 09/21/2018 Performed At: [facility] [place], [place] [id number] Rush Farmer MD IW:[id number]    Source of Sample TISSUE  Final    Comment: RIGHT KNEE Performed at [facility], [place] [address], [place], [place] [address]   Aerobic/Anaerobic Culture (surgical/deep wound)     Status: None   Collection Time: 09/17/18 11:43 AM   Specimen: Wound  Result Value Ref Range Status   Specimen Description   Final    TISSUE LEFT KNEE 2 Performed at [facility], [address] [place], [place] [address]    Special Requests PATIENT ON FOLLOWING MAXIPIME VANCOMYCIN  Final    Gram Stain   Final    FEW WBC PRESENT, PREDOMINANTLY PMN NO ORGANISMS SEEN    Culture   Final    No growth aerobically or anaerobically. Performed at [facility], [place] [address], [place], [place] [address]    Report Status 09/22/2018 FINAL  Final  Aerobic/Anaerobic Culture (surgical/deep wound)     Status: None   Collection Time: 09/17/18 11:44 AM   Specimen: Wound  Result Value Ref Range Status   Specimen Description   Final    TISSUE LEFT KNEE 3 Performed at [facility], [address] [place], [place] [address]    Special Requests PATIENT ON FOLLOWING MAXIPIME VANCOMYCIN  Final   Gram Stain   Final    MODERATE WBC PRESENT, PREDOMINANTLY PMN NO ORGANISMS SEEN    Culture   Final    No growth aerobically or anaerobically. Performed at [facility], [place] [address] [place], [place] [address]    Report Status 09/22/2018 FINAL  Final  Aerobic/Anaerobic Culture (surgical/deep wound)     Status: None   Collection Time: 09/18/18  9:43 PM   Specimen: [facility] Other; Tissue  Result Value Ref Range Status   Specimen Description   Final    SYNOVIAL Performed at [facility], [address] [place], [place] [address]    Special Requests  Final    NONE Performed at [facility], [address] [place], [place] [address]    Gram Stain   Final    RARE WBC PRESENT, PREDOMINANTLY MONONUCLEAR NO ORGANISMS SEEN    Culture   Final    No growth aerobically or anaerobically. Performed at [facility], [place] [address], [place], [place] [address]    Report Status 09/24/2018 FINAL  Final  Aerobic/Anaerobic Culture (surgical/deep wound)     Status: None   Collection Time: 09/18/18  9:44 PM   Specimen: [facility] Other; Tissue  Result Value Ref Range Status   Specimen Description   Final    SYNOVIAL Performed at [facility], [address], [place], [place] [address]    Special Requests   Final    RIGHT KNEE Performed at  [facility], [place], [place] [address]    Gram Stain   Final    FEW WBC PRESENT,BOTH PMN AND MONONUCLEAR NO ORGANISMS SEEN    Culture   Final    No growth aerobically or anaerobically. Performed at [facility], [place] [address] [place], [place] [address]    Report Status 09/24/2018 FINAL  Final  Aerobic/Anaerobic Culture (surgical/deep wound)     Status: None   Collection Time: 09/18/18  9:45 PM   Specimen: [facility] Other; Tissue  Result Value Ref Range Status   Specimen Description   Final    SYNOVIAL Performed at [facility], [address], [place], [place] [address]    Special Requests   Final    RIGHT KNEE Performed at [facility], [address] [place], [place] [address]    Gram Stain   Final    FEW WBC PRESENT, PREDOMINANTLY MONONUCLEAR NO ORGANISMS SEEN    Culture   Final    No growth aerobically or anaerobically. Performed at [facility], [place] [address], [place], [place] [address]    Report Status 09/24/2018 FINAL  Final  Culture, blood (Routine X 2) w Reflex to ID Panel     Status: None   Collection Time: 09/19/18  1:11 PM   Specimen: BLOOD  Result Value Ref Range Status   Specimen Description BLOOD LEFT ANTECUBITAL  Final   Special Requests   Final    BOTTLES DRAWN AEROBIC AND ANAEROBIC Blood Culture results may not be optimal due to an excessive volume of blood received in culture bottles   Culture   Final    NO GROWTH 5 DAYS Performed at [facility], [address] [place], [place] [address]    Report Status 09/24/2018 FINAL  Final  Culture, blood (Routine X 2) w Reflex to ID Panel     Status: None   Collection Time: 09/19/18  1:20 PM   Specimen: BLOOD  Result Value Ref Range Status   Specimen Description BLOOD BLOOD RIGHT HAND  Final   Special Requests   Final    BOTTLES DRAWN AEROBIC AND ANAEROBIC Blood Culture results may not be optimal due to an excessive volume of blood  received in culture bottles   Culture   Final    NO GROWTH 5 DAYS Performed at [facility], [address] [place], [place] [address]    Report Status 09/24/2018 FINAL  Final  Urine Culture     Status: None   Collection Time: 09/20/18  3:59 PM   Specimen: Urine, Random  Result Value Ref Range Status   Specimen  Description   Final    URINE, RANDOM Performed at [facility], [address] [place], [place] [address]    Special Requests   Final    NONE Performed at [facility], [address] [place], [place] [address]    Culture   Final    NO GROWTH Performed at [facility], [place] [address], [place], [place] [address]    Report Status 09/21/2018 FINAL  Final  C difficile quick scan w PCR reflex     Status: None   Collection Time: 09/21/18  3:27 PM   Specimen: STOOL  Result Value Ref Range Status   C Diff antigen NEGATIVE NEGATIVE Final   C Diff toxin NEGATIVE NEGATIVE Final   C Diff interpretation No C. difficile detected.  Final    Comment: Performed at [facility], [address] [place], [place] [address]  Gastrointestinal Panel by PCR , Stool     Status: None   Collection Time: 09/21/18  3:27 PM   Specimen: Stool  Result Value Ref Range Status   Campylobacter species NOT DETECTED NOT DETECTED Final   Plesimonas shigelloides NOT DETECTED NOT DETECTED Final   Salmonella species NOT DETECTED NOT DETECTED Final   Yersinia enterocolitica NOT DETECTED NOT DETECTED Final   Vibrio species NOT DETECTED NOT DETECTED Final   Vibrio cholerae NOT DETECTED NOT DETECTED Final   Enteroaggregative E coli (EAEC) NOT DETECTED NOT DETECTED Final   Enteropathogenic E coli (EPEC) NOT DETECTED NOT DETECTED Final   Enterotoxigenic E coli (ETEC) NOT DETECTED NOT DETECTED Final   Shiga like toxin producing E coli (STEC) NOT DETECTED NOT DETECTED Final   Shigella/Enteroinvasive E coli (EIEC) NOT DETECTED NOT DETECTED Final   Cryptosporidium  NOT DETECTED NOT DETECTED Final   Cyclospora c[place]etanensis NOT DETECTED NOT DETECTED Final   Entamoeba histolytica NOT DETECTED NOT DETECTED Final   Giardia lamblia NOT DETECTED NOT DETECTED Final   Adenovirus F40/41 NOT DETECTED NOT DETECTED Final   Astrovirus NOT DETECTED NOT DETECTED Final   Norovirus GI/GII NOT DETECTED NOT DETECTED Final   Rotavirus A NOT DETECTED NOT DETECTED Final   Sapovirus (I, II, IV, and V) NOT DETECTED NOT DETECTED Final    Comment: Performed at [facility], [address] [place], [place] [address]  Novel Coronavirus, NAA (hospital order; send-out to ref lab)     Status: None   Collection Time: 09/29/18  4:36 PM   Specimen: Nasopharyngeal Swab; Respiratory  Result Value Ref Range Status   SARS-CoV-2, NAA NOT DETECTED NOT DETECTED Final    Comment: (NOTE) This test was developed and its performance characteristics determined by [employer]. This test has not been FDA cleared or approved. This test has been authorized by FDA under an Emergency Use Authorization (EUA). This test is only authorized for the duration of time the declaration that circumstances exist justifying the authorization of the emergency use of in vitro diagnostic tests for detection of SARS-CoV-2 virus and/or diagnosis of COVID-19 infection under section 564(b)(1) of the Act, 21 U.S.C. 720NOB-0(J)(6), unless the authorization is terminated or revoked sooner. When diagnostic testing is negative, the possibility of a false negative result should be considered in the context of a patient's recent exposures and the presence of clinical signs and symptoms consistent with COVID-19. An individual without symptoms of COVID-19 and who is not shedding SARS-CoV-2 virus would expect to have a negative (not detected) result in this assay. Performed  At: [facility]  [place], [place] [id number] Rush Farmer MD [id number]    Coronavirus Source  NASOPHARYNGEAL  Final    Comment: Performed at [facility], [address] [place], [place] [address]  CULTURE, BLOOD (ROUTINE X 2) w Reflex to ID Panel     Status: None   Collection Time: 10/02/18  5:26 PM   Specimen: BLOOD  Result Value Ref Range Status   Specimen Description BLOOD BLOOD RIGHT FOREARM  Final   Special Requests   Final    BOTTLES DRAWN AEROBIC AND ANAEROBIC Blood Culture adequate volume   Culture   Final    NO GROWTH 5 DAYS Performed at [facility], [address] [place], [place] [address]    Report Status 10/07/2018 FINAL  Final  CULTURE, BLOOD (ROUTINE X 2) w Reflex to ID Panel     Status: None   Collection Time: 10/02/18  5:41 PM   Specimen: BLOOD  Result Value Ref Range Status   Specimen Description BLOOD BLOOD RIGHT HAND  Final   Special Requests   Final    BOTTLES DRAWN AEROBIC AND ANAEROBIC Blood Culture adequate volume   Culture   Final    NO GROWTH 5 DAYS Performed at [facility], [address] [place], [place] [address]    Report Status 10/07/2018 FINAL  Final    Coagulation Studies: Recent Labs    10/08/18 0547 10/09/18 0407 10/10/18 0549  LABPROT 25.1* 24.8* 22.4*  INR 2.3* 2.3* 2.0*    Urinalysis: No results for input(s): COLORURINE, LABSPEC, PHURINE, GLUCOSEU, HGBUR, BILIRUBINUR, KETONESUR, PROTEINUR, UROBILINOGEN, NITRITE, LEUKOCYTESUR in the last 72 hours.  Invalid input(s): APPERANCEUR    Imaging: No results found.   Medications:   . sodium chloride 5 mL/hr at 09/29/18 0242  . sodium chloride 0 mL/hr at 09/30/18 1534  . anticoagulant sodium citrate    . DAPTOmycin (CUBICIN)  IV 700 mg (10/10/18 1359)   . aspirin EC  81 mg Oral Daily  . Chlorhexidine Gluconate Cloth  6 each Topical Q0600  . citalopram  10 mg Oral Daily  . feeding supplement (ENSURE ENLIVE)  237 mL Oral BID BM  . fludrocortisone  0.1 mg Oral Daily  . fluticasone furoate-vilanterol  1 puff Inhalation Daily  . folic acid   1 mg Oral Daily  . hydrocortisone  20 mg Oral BID  . insulin aspart  0-5 Units Subcutaneous QHS  . insulin aspart  0-9 Units Subcutaneous TID WC  . levofloxacin  500 mg Oral Daily  . mouth rinse  15 mL Mouth Rinse BID  . multivitamin with minerals  1 tablet Oral Daily  . omega-3 acid ethyl esters  1 g Oral Daily  . sodium chloride flush  3 mL Intravenous Q12H  . warfarin  6 mg Oral q1800  . Warfarin - Pharmacist Dosing Inpatient   Does not apply q1800   sodium chloride, sodium chloride, acetaminophen **OR** acetaminophen, alum & mag hydroxide-simeth, anticoagulant sodium citrate, HYDROcodone-acetaminophen, iohexol, ipratropium-albuterol, menthol-cetylpyridinium **OR** phenol, ondansetron **OR** ondansetron (ZOFRAN) IV, sodium chloride flush  Assessment/ Plan:  [name] is a 58 y.o. black male with hyeprtension, COPD, diabetes mellitus type II who underwent bilateral knee replacement surgeries on 5/15 at [facility]. who was admitted to [facility] on 09/12/2018 for septic arthritis. Hospital course with left knee surgery on 5/31 and right knee surgery on 6/1 by Dr. Harlow Mares.   1. Acute renal failure  baseline creatinine of 1.07 with normal GFR in 06/12/18.  Acute renal failure secondary to ATN. Required hemodialysis. First treatment was 6/9. Last hemodialysis treatment 6/12.  Continues to have improving renal function and nonoliguric urine output.  Dialysis access has been removed. No indication for further dialysis.  Monitor lab results closely  2. Septic shock: with septic arthritis.  -Antibiotics as per recommendations of the ID team  3. Hypotension and Hypokalemia: with concern of adrenal insufficiency.   BP acceptable MRI with 5.1 cm left adrenal mass, 3.8 cm right adrenal mass.  Nonspecific characteristics. - Will need  Further workup as an outpatient - fludrocortisone and hydrocortisone - MRI with gadolinium when renal function back to baseline.   4. Anemia with renal  failure: Lab Results  Component Value Date   HGB 7.8 (L) 10/05/2018  Will start EPO low-dose     LOS: [place] 6/23/20204:01 PM

## 2018-10-11 ENCOUNTER — Other Ambulatory Visit: Payer: Self-pay | Admitting: Family Medicine

## 2018-10-11 DIAGNOSIS — E278 Other specified disorders of adrenal gland: Secondary | ICD-10-CM

## 2018-10-11 LAB — CULTURE, FUNGUS WITHOUT SMEAR

## 2018-10-11 LAB — RENAL FUNCTION PANEL
Albumin: 2.3 g/dL — ABNORMAL LOW (ref 3.5–5.0)
Anion gap: 9 (ref 5–15)
BUN: 26 mg/dL — ABNORMAL HIGH (ref 6–20)
CO2: 24 mmol/L (ref 22–32)
Calcium: 8.2 mg/dL — ABNORMAL LOW (ref 8.9–10.3)
Chloride: 103 mmol/L (ref 98–111)
Creatinine, Ser: 1.93 mg/dL — ABNORMAL HIGH (ref 0.61–1.24)
GFR calc Af Amer: 43 mL/min — ABNORMAL LOW (ref >60)
GFR calc non Af Amer: 37 mL/min — ABNORMAL LOW (ref >60)
Glucose, Bld: 169 mg/dL — ABNORMAL HIGH (ref 70–99)
Phosphorus: 4.2 mg/dL (ref 2.5–4.6)
Potassium: 3.7 mmol/L (ref 3.5–5.1)
Sodium: 136 mmol/L (ref 135–145)

## 2018-10-11 LAB — CBC
HCT: 22.5 % — ABNORMAL LOW (ref 39.0–52.0)
Hemoglobin: 7 g/dL — ABNORMAL LOW (ref 13.0–17.0)
MCH: 25.6 pg — ABNORMAL LOW (ref 26.0–34.0)
MCHC: 31.1 g/dL (ref 30.0–36.0)
MCV: 82.4 fL (ref 80.0–100.0)
Platelets: 377 10*3/uL (ref 150–400)
RBC: 2.73 MIL/uL — ABNORMAL LOW (ref 4.22–5.81)
RDW: 16.2 % — ABNORMAL HIGH (ref 11.5–15.5)
WBC: 7.6 10*3/uL (ref 4.0–10.5)
nRBC: 0 % (ref 0.0–0.2)

## 2018-10-11 LAB — PROTIME-INR
INR: 1.8 — ABNORMAL HIGH (ref 0.8–1.2)
Prothrombin Time: 20.8 seconds — ABNORMAL HIGH (ref 11.4–15.2)

## 2018-10-11 LAB — GLUCOSE, CAPILLARY
Glucose-Capillary: 125 mg/dL — ABNORMAL HIGH (ref 70–99)
Glucose-Capillary: 112 mg/dL — ABNORMAL HIGH (ref 70–99)
Glucose-Capillary: 126 mg/dL — ABNORMAL HIGH (ref 70–99)
Glucose-Capillary: 152 mg/dL — ABNORMAL HIGH (ref 70–99)

## 2018-10-11 MED ORDER — HYDROCORTISONE 10 MG PO TABS
20.0000 mg | ORAL_TABLET | Freq: Two times a day (BID) | ORAL | Status: DC
  Administered 2018-10-11 – 2018-10-12 (×2): 20 mg via ORAL
  Filled 2018-10-11 (×3): qty 2

## 2018-10-11 MED ORDER — DOXYCYCLINE HYCLATE 100 MG PO TABS
100.0000 mg | ORAL_TABLET | Freq: Two times a day (BID) | ORAL | Status: DC
  Administered 2018-10-12: 100 mg via ORAL
  Filled 2018-10-11: qty 1

## 2018-10-11 MED ORDER — RISAQUAD PO CAPS
1.0000 | ORAL_CAPSULE | Freq: Every day | ORAL | Status: DC
  Administered 2018-10-11 – 2018-10-12 (×2): 1 via ORAL
  Filled 2018-10-11 (×2): qty 1

## 2018-10-11 MED ORDER — WARFARIN SODIUM 7.5 MG PO TABS
7.5000 mg | ORAL_TABLET | Freq: Once | ORAL | Status: AC
  Administered 2018-10-11: 7.5 mg via ORAL
  Filled 2018-10-11: qty 1

## 2018-10-11 MED ORDER — FLUDROCORTISONE ACETATE 0.1 MG PO TABS
0.1000 mg | ORAL_TABLET | Freq: Two times a day (BID) | ORAL | Status: DC
  Administered 2018-10-11 – 2018-10-12 (×3): 0.1 mg via ORAL
  Filled 2018-10-11 (×4): qty 1

## 2018-10-11 MED ORDER — EPOETIN ALFA 40000 UNIT/ML IJ SOLN: 20000.0000 [IU] | INTRAMUSCULAR | Status: DC

## 2018-10-11 NOTE — Progress Notes (Signed)
Physical Therapy Treatment Patient Details Name: Isaac Cross MRN: [id number] DOB: [birth year] Today's Date: 10/11/2018    History of Present Illness Pt is a 58 y.o. male presenting to hospital 09/12/18 after rolling OOB landing on L knee; pt with increased dizziness x2 days, fevers x3 days, and generalized weakness.  S/p B TKR 09/01/18 in [place] (pt reports discharging home same day).  Pt admitted with sepsis, acute renal failure, and B TKR.  Pt s/p L knee 5/31 and R knee 6/1 I&D with poly exchange.  Pt noted with severe encephalopathy from DT's with extensive ETOH abuse.  L LE DVT noted.  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  HD started 6/5 with L IJ temporary HD catheter.  PMH includes B TKR 09/01/18 in [place], DM, asthma, R vein surgery, COPD. Right IJ line removed 6/16. 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    PT Comments    Patient continues to require significant increase in time to process and initiate all functional mobility tasks, upwards of 20 min to complete simple sit/stand.  Easily distracted by external environment; frequent cuing/reminders for attention to and initiation of requested task. Once upright, patient with immediate onset of dizziness this date; unable to stand for duration to allow full standing BP measurement (upon return to sitting 90/70); additional gait distance deferred as result. Will continue to progress as appropriate and medically indicated (of note, current HgB noted 7.0 this date).     Follow Up Recommendations  CIR     Equipment Recommendations       Recommendations for Other Services       Precautions / Restrictions Precautions Precautions: Fall Restrictions Weight Bearing Restrictions: Yes RLE Weight Bearing: Weight bearing as tolerated LLE Weight Bearing: Weight bearing as tolerated    Mobility  Bed Mobility Overal bed mobility: Needs Assistance Bed Mobility: Supine to Sit     Supine to sit:  Supervision        Transfers Overall transfer level: Needs assistance Equipment used: Rolling walker (2 wheeled) Transfers: Sit to/from Stand Sit to Stand: Min assist;+2 physical assistance         General transfer comment: limited carry-over of hand placement and overall technique this date  Ambulation/Gait Ambulation/Gait assistance: Min assist;+2 physical assistance Gait Distance (Feet): 5 Feet Assistive device: Rolling walker (2 wheeled)       General Gait Details: consistently draping self across RW in stance, sighing; increased dizziness reported this date.  Additional distance/gait deferred for safety   Stairs             Wheelchair Mobility    Modified Rankin (Stroke Patients Only)       Balance Overall balance assessment: Needs assistance Sitting-balance support: No upper extremity supported;Feet supported Sitting balance-Leahy Scale: Good     Standing balance support: Bilateral upper extremity supported Standing balance-Leahy Scale: Poor                              Cognition Arousal/Alertness: Awake/alert Behavior During Therapy: WFL for tasks assessed/performed                                   General Comments: remains significantly delayed in processing and response time, both verbally and physically (requiring up to 20 minutes to initiate sit/stand from edge of bed surface)      Exercises  General Comments        Pertinent Vitals/Pain Pain Assessment: No/denies pain(generalized soreness throughout bilat LEs, unrated)    Home Living                      Prior Function            PT Goals (current goals can now be found in the care plan section) Acute Rehab PT Goals Patient Stated Goal: then go home PT Goal Formulation: With patient Time For Goal Achievement: 10/23/18 Potential to Achieve Goals: Good Progress towards PT goals: Not progressing toward goals - comment(limited by  symptomatic orthostatsis)    Frequency    7X/week      PT Plan Current plan remains appropriate    Co-evaluation              AM-PAC PT "6 Clicks" Mobility   Outcome Measure  Help needed turning from your back to your side while in a flat bed without using bedrails?: None Help needed moving from lying on your back to sitting on the side of a flat bed without using bedrails?: None Help needed moving to and from a bed to a chair (including a wheelchair)?: A Little Help needed standing up from a chair using your arms (e.g., wheelchair or bedside chair)?: A Little Help needed to walk in hospital room?: A Little Help needed climbing 3-5 steps with a railing? : A Little 6 Click Score: 20    End of Session Equipment Utilized During Treatment: Gait belt Activity Tolerance: Treatment limited secondary to medical complications (Comment)(limited by symptomatic orthostatsis) Patient left: in chair;with call bell/phone within reach;with chair alarm set Nurse Communication: Mobility status PT Visit Diagnosis: Other abnormalities of gait and mobility (R26.89);Muscle weakness (generalized) (M62.81);Difficulty in walking, not elsewhere classified (R26.2)     Time: 0037-0488 PT Time Calculation (min) (ACUTE ONLY): 32 min  Charges:  $Therapeutic Activity: 23-37 mins                     Sameen Leas H. Owens Shark, PT, DPT, NCS 10/11/18, 10:04 AM [phone]

## 2018-10-11 NOTE — Progress Notes (Signed)
Isaac Cross at [place] NAME: Isaac Cross    MR#:  [id number]  DATE OF BIRTH:  [birth year]  SUBJECTIVE:  Patient without complaint, physical therapy continues to be quite challenging-they are recommending SNF status post discharge, however patient wants to go home, he is improving in strength. Still orthostatic.  REVIEW OF SYSTEMS:  Review of Systems  Constitutional: Positive for malaise/fatigue. Negative for chills, fever and weight loss.  HENT: Negative for ear discharge, ear pain and nosebleeds.   Eyes: Negative for blurred vision, pain and discharge.  Respiratory: Negative for sputum production, shortness of breath, wheezing and stridor.   Cardiovascular: Negative for chest pain, palpitations, orthopnea and PND.  Gastrointestinal: Negative for abdominal pain, diarrhea, nausea and vomiting.  Genitourinary: Negative for frequency and urgency.  Musculoskeletal: Negative for back pain and joint pain.  Neurological: Negative for sensory change, speech change, focal weakness and weakness.  Psychiatric/Behavioral: Negative for depression and hallucinations. The patient is not nervous/anxious.    DRUG ALLERGIES:   Allergies  Allergen Reactions  . 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 Lipitor [Atorvastatin] Hives and Itching  . Other   . Viagra  [Sildenafil Citrate]     vision loss    VITALS:  Blood pressure (!) 133/102, pulse 99, temperature 98.5 F (36.9 C), temperature source Oral, resp. rate 20, height 6\' 4"  (1.93 m), weight (!) 156 kg, SpO2 100 %.  PHYSICAL EXAMINATION:  Physical Exam   GENERAL:  58 y.o.-year-old patient lying in the bed with no acute distress. Weak. EYES: Pupils equal, round, reactive to light and accommodation. No scleral icterus.  HEENT: Head atraumatic, normocephalic. Oropharynx and nasopharynx clear.  NECK:  Supple, no jugular venous distention. No thyroid enlargement, no  tenderness.  LUNGS: Normal breath sounds bilaterally, no wheezing, rales,rhonchi or crepitation. No use of accessory muscles of respiration.  Decreased bibasilar breath sounds CARDIOVASCULAR: S1, S2 normal. No murmurs, rubs, or gallops. ABDOMEN: Soft, nontender, nondistended. Bowel sounds present. No organomegaly or mass. EXTREMITIES: Swelling of both legs, bilateral knee dressing.  No  cyanosis, or clubbing.  NEUROLOGIC: Cranial nerves II through XII are intact. Moving all extremities well PSYCHIATRIC: patient is alert and oriented x3 SKIN: No obvious rash, lesion, or ulcer.   LABORATORY PANEL:   CBC Recent Labs  Lab 10/11/18 0352  WBC 7.6  HGB 7.0*  HCT 22.5*  PLT 377   ------------------------------------------------------------------------------------------------------------------  Chemistries  Recent Labs  Lab 10/11/18 0352  NA 136  K 3.7  CL 103  CO2 24  GLUCOSE 169*  BUN 26*  CREATININE 1.93*  CALCIUM 8.2*   ------------------------------------------------------------------------------------------------------------------  Cardiac Enzymes No results for input(s): TROPONINI in the last 168 hours. ------------------------------------------------------------------------------------------------------------------  RADIOLOGY:  No results found.  EKG:   Orders placed or performed during the hospital encounter of 09/12/18  . EKG 12-Lead  . EKG 12-Lead  . EKG 12-Lead  . EKG 12-Lead  . EKG 12-Lead  . EKG 12-Lead    ASSESSMENT AND PLAN:  EdwardGradyis a58 y.o.malewith a known history of arthritis, COPD not on home oxygen, hypertension, sleep apnea, non-insulin-dependent diabetes mellitus presents to hospital secondary to dizziness, weakness and a fall  * Sepsis Resolved Suspected due to seeding of bilateral knee replacements, s/p recent bilateral knee surgeries, s/p bilateral knee washouts done this admission Fluid Cultures neg - but patient was on  antibiotics prior to his washout and sampling Fungal cultures negative - completed 7 days  of anidulafungin  Continue daptomycin for 4 more days, Levaquin until 10/23/2018 per ID, SNF unable to take patient due to expensive medications, discussed with case management/social work to see if antibiotics can be done as outpatient As it is only 3 to 4 days of IV antibiotics needed, we decided to finish the course over here and then sent home. As per ID we can stop IV antibiotics after tomorrow as he would be finishing 26 days of antibiotics.  *Acute non - Oliguric renal failure Resolving slowly-creatinine 2.4 down from 2.8 likely ATN from sepsis/hypotension Nephrology input greatly appreciated, s/p hemodialysis 09/22/2018, continue to hold lisinopril/metformin, avoid nephrotoxic agents, strict I&O monitoring, daily weights, BMP daily  * Acute encephalopathy Resolved secondary to metabolic causes, sepsis and uremia S/P delirium tremens for period of time which was treated in the ICU  MRi Brain - Nothing acute Neurology/Dr. 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  *Primary adrenal insufficiency Low cortisol level.  High ACTH level.  No response to cosyntropin dose. 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  Continue Florinef and hydrocortisone Will need outpt F/U with Endocrinology s/p discharge-spoke to Dr.Ocanolle-at [facility]-he suggested to continue hydrocortisone and Florinef and follow-up in his clinic next week.  * Anemia of chronic disease Hemoglobin slowly drifting down likely due to kidney disease, started on Epogen injection by nephrologist.  * Hypokalemia Replete   *Hypomagnesemia Repleted  *Orthostatic hypotension Continue Florinef as advised by endocrinologist.   DVT prophylaxis-on Coumadin  Disposition to: Inpatient rehab when bed is available, 2-3 days  CODE STATUS: Full code  TOTAL TIME TAKING CARE OF THIS PATIENT: 35 minutes.   Isaac Cross M.D on 10/11/2018 at 2:34 PM  Between 7am to 6pm - Pager - [phone]  After 6pm go to [email or web address] - password [email or web address] [facility]  Office  [phone]  CC: Primary care physician; Steele Sizer, MD

## 2018-10-11 NOTE — Progress Notes (Signed)
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  L DVT Argatroban drip discontinued 10/03/2018  Allergies  Allergen Reactions  . 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 Lipitor [Atorvastatin] Hives and Itching  . Other   . Viagra  [Sildenafil Citrate]     vision loss    Patient Measurements: Height: 6\' 4"  (193 cm) Weight: (!) 343 lb 14.7 oz (156 kg) IBW/kg (Calculated) : 86.8  Vital Signs: Temp: 98.9 F (37.2 C) (06/23 2349) Temp Source: Oral (06/23 2349) BP: 133/85 (06/23 2349) Pulse Rate: 96 (06/23 2349)  Labs: Recent Labs    10/09/18 0407 10/10/18 0549 10/11/18 0352  HGB  --   --  7.0*  HCT  --   --  22.5*  PLT  --   --  377  LABPROT 24.8* 22.4* 20.8*  INR 2.3* 2.0* 1.8*  CREATININE 2.42*  --  1.93*    Estimated Creatinine Clearance: 65.9 mL/min (A) (by C-G formula based on SCr of 1.93 mg/dL (H)).   Assessment: 58 yr male had B/l knee replacements Sep 01, 2018 was admitted with fever, AKI/ Platelet count has been declining since 6/5. He was started on heparin 6/3. 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 No dose given 6/22 INR 2.3 6 mg 6/23 INR 2.0 6 mg 6/24 INR 1.8  Goal of Therapy:  Monitor platelets by anticoagulation protocol: Yes INR: 2-3   Plan:  INR trending down and subtherapeutic at 1.8 today, likely from missed dose a few days ago. Will order warfarin 7.5 mg x1 tonight. Will obtain INR daily. Will need CBC at least every three days per policy.   Patient on levofloxacin (started 6/17) and daptomycin Pharmacy will continue to monitor and adjust per consult.   Rocky Morel, RPh 10/11/2018 7:48 AM

## 2018-10-11 NOTE — Progress Notes (Signed)
[facility] at [place] NAME: Isaac Cross    MR#:  [id number]  DATE OF BIRTH:  [birth year]  SUBJECTIVE:  Patient without complaint, physical therapy continues to be quite challenging-they are recommending SNF status post discharge, however patient wants to go home, he is improving in strength. Still orthostatic.  REVIEW OF SYSTEMS:  Review of Systems  Constitutional: Positive for malaise/fatigue. Negative for chills, fever and weight loss.  HENT: Negative for ear discharge, ear pain and nosebleeds.   Eyes: Negative for blurred vision, pain and discharge.  Respiratory: Negative for sputum production, shortness of breath, wheezing and stridor.   Cardiovascular: Negative for chest pain, palpitations, orthopnea and PND.  Gastrointestinal: Negative for abdominal pain, diarrhea, nausea and vomiting.  Genitourinary: Negative for frequency and urgency.  Musculoskeletal: Negative for back pain and joint pain.  Neurological: Negative for sensory change, speech change, focal weakness and weakness.  Psychiatric/Behavioral: Negative for depression and hallucinations. The patient is not nervous/anxious.    DRUG ALLERGIES:   Allergies  Allergen Reactions  . 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 Lipitor [Atorvastatin] Hives and Itching  . Other   . Viagra  [Sildenafil Citrate]     vision loss    VITALS:  Blood pressure (!) 133/102, pulse 99, temperature 98.5 F (36.9 C), temperature source Oral, resp. rate 20, height 6\' 4"  (1.93 m), weight (!) 156 kg, SpO2 100 %.  PHYSICAL EXAMINATION:  Physical Exam   GENERAL:  58 y.o.-year-old patient lying in the bed with no acute distress. Weak. EYES: Pupils equal, round, reactive to light and accommodation. No scleral icterus.  HEENT: Head atraumatic, normocephalic. Oropharynx and nasopharynx clear.  NECK:  Supple, no jugular venous distention. No thyroid enlargement, no  tenderness.  LUNGS: Normal breath sounds bilaterally, no wheezing, rales,rhonchi or crepitation. No use of accessory muscles of respiration.  Decreased bibasilar breath sounds CARDIOVASCULAR: S1, S2 normal. No murmurs, rubs, or gallops. ABDOMEN: Soft, nontender, nondistended. Bowel sounds present. No organomegaly or mass. EXTREMITIES: Swelling of both legs, bilateral knee dressing.  No  cyanosis, or clubbing.  NEUROLOGIC: Cranial nerves II through XII are intact. Moving all extremities well PSYCHIATRIC: patient is alert and oriented x3 SKIN: No obvious rash, lesion, or ulcer.   LABORATORY PANEL:   CBC Recent Labs  Lab 10/11/18 0352  WBC 7.6  HGB 7.0*  HCT 22.5*  PLT 377   ------------------------------------------------------------------------------------------------------------------  Chemistries  Recent Labs  Lab 10/11/18 0352  NA 136  K 3.7  CL 103  CO2 24  GLUCOSE 169*  BUN 26*  CREATININE 1.93*  CALCIUM 8.2*   ------------------------------------------------------------------------------------------------------------------  Cardiac Enzymes No results for input(s): TROPONINI in the last 168 hours. ------------------------------------------------------------------------------------------------------------------  RADIOLOGY:  No results found.  EKG:   Orders placed or performed during the hospital encounter of 09/12/18  . EKG 12-Lead  . EKG 12-Lead  . EKG 12-Lead  . EKG 12-Lead  . EKG 12-Lead  . EKG 12-Lead    ASSESSMENT AND PLAN:  EdwardGradyis a58 y.o.malewith a known history of arthritis, COPD not on home oxygen, hypertension, sleep apnea, non-insulin-dependent diabetes mellitus presents to hospital secondary to dizziness, weakness and a fall  * Sepsis Resolved Suspected due to seeding of bilateral knee replacements, s/p recent bilateral knee surgeries, s/p bilateral knee washouts done this admission Fluid Cultures neg - but patient was on  antibiotics prior to his washout and sampling Fungal cultures negative - completed 7 days  of anidulafungin  Continue daptomycin for 4 more days, Levaquin until 10/23/2018 per ID, SNF unable to take patient due to expensive medications, discussed with case management/social work to see if antibiotics can be done as outpatient As it is only 3 to 4 days of IV antibiotics needed, we decided to finish the course over here and then sent home.  *Acute non - Oliguric renal failure Resolving slowly-creatinine 2.4 down from 2.8 likely ATN from sepsis/hypotension Nephrology input greatly appreciated, s/p hemodialysis 09/22/2018, continue to hold lisinopril/metformin, avoid nephrotoxic agents, strict I&O monitoring, daily weights, BMP daily  * Acute encephalopathy Resolved secondary to metabolic causes, sepsis and uremia S/P delirium tremens for period of time which was treated in the ICU  MRi Brain - Nothing acute Neurology/Dr. 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  *Primary adrenal insufficiency Low cortisol level.  High ACTH level.  No response to cosyntropin dose. 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  Continue Florinef and hydrocortisone Will need outpt F/U with Endocrinology s/p discharge  * Anemia of chronic disease Stable  * Hypokalemia Replete   *Hypomagnesemia Repleted  DVT prophylaxis-on Coumadin  Disposition to: Inpatient rehab when bed is available, 2-3 days  CODE STATUS: Full code  TOTAL TIME TAKING CARE OF THIS PATIENT: 35 minutes.   Isaac Cross M.D on 10/11/2018 at 2:18 PM  Between 7am to 6pm - Pager - [phone]  After 6pm go to [email or web address] - password [email or web address] [facility]  Office  [phone]  CC: Primary care physician;  Isaac Sizer, MD

## 2018-10-11 NOTE — Progress Notes (Signed)
Date of Admission:  09/12/2018    Subjective: Doing better, but had postural drop in BP this morning Says he is slow in the mornings  Medications:  . aspirin EC  81 mg Oral Daily  . Chlorhexidine Gluconate Cloth  6 each Topical Q0600  . citalopram  10 mg Oral Daily  . epoetin (EPOGEN/PROCRIT) injection  20,000 Units Intravenous Weekly  . feeding supplement (ENSURE ENLIVE)  237 mL Oral BID BM  . fludrocortisone  0.1 mg Oral Daily  . fluticasone furoate-vilanterol  1 puff Inhalation Daily  . folic acid  1 mg Oral Daily  . hydrocortisone  20 mg Oral BID  . insulin aspart  0-5 Units Subcutaneous QHS  . insulin aspart  0-9 Units Subcutaneous TID WC  . levofloxacin  500 mg Oral Daily  . mouth rinse  15 mL Mouth Rinse BID  . multivitamin with minerals  1 tablet Oral Daily  . omega-3 acid ethyl esters  1 g Oral Daily  . sodium chloride flush  3 mL Intravenous Q12H  . warfarin  7.5 mg Oral ONCE-1800  . Warfarin - Pharmacist Dosing Inpatient   Does not apply q1800    Objective: Patient Vitals for the past 24 hrs:  BP Temp Temp src Pulse Resp SpO2  10/11/18 0819 (!) 133/102 98.5 F (36.9 C) Oral 99 - 100 %  10/10/18 2349 133/85 98.9 F (37.2 C) Oral 96 20 98 %  10/10/18 1640 127/85 98.1 F (36.7 C) Oral (!) 105 - 97 %   PHYSICAL EXAM:  General: Alert, cooperative, no distress, appears stated age.  Head: Normocephalic, without obvious abnormality, atraumatic. Eyes: Conjunctivae clear, anicteric sclerae. Pupils are equal ENT Nares normal. No drainage or sinus tenderness. Lips, mucosa, and tongue normal. No Thrush Neck: Supple, symmetrical, no adenopathy, thyroid: non tender no carotid bruit and no JVD. Back: No CVA tenderness. Lungs: Clear to auscultation bilaterally. No Wheezing or Rhonchi. No rales. Heart: Regular rate and rhythm, no murmur, rub or gallop. Abdomen: Soft, non-tender,not distended. Bowel sounds normal. No masses Extremities:knee and leg swelling much better  Skin: No rashes or lesions. Or bruising Lymph: Cervical, supraclavicular normal. Neurologic: Grossly non-focal  Lab Results Recent Labs    10/09/18 0407 10/11/18 0352  WBC  --  7.6  HGB  --  7.0*  HCT  --  22.5*  NA 140 136  K 3.7 3.7  CL 107 103  CO2 23 24  BUN 24* 26*  CREATININE 2.42* 1.93*   Liver Panel Recent Labs    10/11/18 0352  ALBUMIN 2.3*   Sedimentation Rate No results for input(s): ESRSEDRATE in the last 72 hours. C-Reactive Protein No results for input(s): CRP in the last 72 hours.  Microbiology:  Studies/Results: No results found.   Lab Results Recent Labs (last 2 labs)       Recent Labs    10/08/18 0547 10/09/18 0407  NA 137 140  K 3.4* 3.7  CL 105 107  CO2 22 23  BUN 26* 24*  CREATININE 2.85* 2.42*     Immunology and other work up for fever ANA 09/12/18-& 09/19/18 =Neg ACE level 09/19/18 NEg Complement c4/C3 Neg 09/28/18 ANCA neg 09/28/18 Beta D glucan 09/20/18-Neg CK 09/13/18 was 4516 CK 10/02/18 is 114 TSH 5.055 on 09/15/18 Cortisol on 09/20/18 was 18.4 ( 4 pm) ( this was done after he got a dose of cortisol 50mg  at noon that day) Quantiferon Gold on 09/21/18 indeterminate QG from 10/03/18 Negative Hepatitis C from  09/21/18 NR Hepatitis A and B neg  HIV NR 09/20/18   ACTH level from 10/04/18 ( 3pm) is 445( normal upto  63)    Microbiology: [facility] 09/12/18-NG 09/19/18 BC NG 6/3 fungal culturebloodneg  Synovial fluidculture left5/29/20 Neg Synovial fluid rt knee NG from 09/16/18 Synovial tissue cultureleft knee5/31NG NGS sent to [place] ( 16s RNA) neg AFB/FUNGAL from left synovial tissue negative from 09/17/18 so far  6/16 Blood Culture neg so far   Studies/Results:        Assessment/Plan: 58 yr male had B/l knee replacements Sep 01, 2018 was admitted with fever, AKI . He was taking bactrim and keflex when he came to the Ed on 5/26/20with fever  Pt was in septic shock and in the  beginning the left knee as the source was questioned  Initially feverswere persisting inspite of IV vanco and cefepime and there was no other source (Blood culture neg, urine culture neg, no pneumonia, ) the left knee was aspirated and it had 8000 wbc and he was taken for a wash out on 09/17/18. Rt knee was washed out on 09/18/18 All cultures were neg Pts fever was down for 36-48 hrs and started up again on 09/19/18 to  103. Central line, femoral line were removed Cefepime was changed to zosyn on 6/2 Vanco was discontinued on 09/19/18 due to worsening renal failure-added anidulafungin6/3and doxy 6/3- - he also was started on stress dose steroids on the same day and was also diagnosed with left popliteal DVT on 09/20/18 and started on heparin- Asfungal blood culture negDC anidulafungin0n 09/26/18 Doxy Dc on 09/26/18 Hydrocortisone  Dc on 6/8 before he was transferred to the floor from ICU  He remained afebrile from 6/4-6/11 and started spiking again pn 6/13 ceftazidime changed ot levaquin questioning drug fever Started with  a 2nd wave  of fever, altered mental status, hypotension ( more orthostasis) , weakness,  Serum cortisol AM was sent on 6/17 and was low at 5.6, , ACTH done on 6/17 was very high at 443, Cosyntropin test  Done on 10/05/18 was positive with no response,  pt was started on steroids and fludrocortisone on 10/05/18. CT scan of the abdomen to check for cause of fever done on 10/04/18 showed b/l adrenal mass. As concern for b/l adrenal hemorrage/infarction causing crisis MRI was done ( without contrast) and it showed b/l adrenal mass   Pt has primary adrenal insufficiency causing adrenal crisis ( high ACTH and low cortisol and no response to cosyntropin test and MRI showing b/l adrenal mass ) the cause of which could a) b/l adrenal hematoma /hemorrhage causing adrenal crisis  B)Adrenal metastasis/malignancy   C) unlikely this is infiltrative adrenal disease Due to  TB/HIV/histoplasma as all tests are negative  Adenoma even if b/l should not cause adrenal insufficiency  Adrenal hyperplasia may cause excess secretion of cortisol and not deficiency  If this is b/l adrenal Hage induced adrenal crisis, was it because of septic shock  precipitated by Xarelto DOAC or did he have adrenal hematoma/hemorrhage primarily from [place] and  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 and caused all his symptoms of fever, shock , volume contraction ( AKI) etc He is currently on hydrocortisone 10mg  BID and fludorocortisone   He needs an endocrine consult He will need MRI with contrast once cr normalizes to look at the adrenals He will need long term/indefinite steroid ------------------------------------------------------------------  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 Will continue PO levaquin 500mg  QD and doxy 100mg  PO bid   Until 10/23/18 to complete 6 weeks of Rx. 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

## 2018-10-11 NOTE — Progress Notes (Signed)
Incentive spirometer teaching done.

## 2018-10-11 NOTE — Progress Notes (Signed)
[facility]  ROUNDING NOTE   Subjective:   Laying in the bed. No complaints.  States that he worked with physical therapist this morning but was slow in participating. Orthostatic drop in blood pressure is noted Able to eat without nausea or vomiting No shortness of breath 06/23 0701 - 06/24 0700 In: 228 [IV Piggyback:228] Out: 1200 [Urine:1200]   Objective:  Vital signs in last 24 hours:  Temp:  [98.1 F (36.7 C)-98.9 F (37.2 C)] 98.5 F (36.9 C) (06/24 0819) Pulse Rate:  [96-105] 99 (06/24 0819) Resp:  [20] 20 (06/23 2349) BP: (127-133)/(85-102) 133/102 (06/24 0819) SpO2:  [97 %-100 %] 100 % (06/24 0819)  Weight change:  Filed Weights   09/29/18 0630 09/29/18 1445 09/29/18 1823  Weight: (!) 157.1 kg (!) 157 kg (!) 156 kg    Intake/Output: I/O last 3 completed shifts: In: 231 [I.V.:3; IV Piggyback:228] Out: 3820 [Urine:3820]   Intake/Output this shift:  Total I/O In: 600 [P.O.:600] Out: 1720 [Urine:1720]  Physical Exam: General: NAD, laying in the bed  Head: Moist oral mucosal membranes  Eyes: Anicteric   Lungs:  Clear, room air  Heart: regular  Abdomen:  Soft, nontender, obese   Extremities: Trace to 1 + peripheral edema  Neurologic: Awake, alert, conversant  Skin: warm        Basic Metabolic Panel: Recent Labs  Lab 10/06/18 0611 10/07/18 0428 10/08/18 0547 10/09/18 0407 10/11/18 0352  NA 135 137 137 140 136  K 3.6 3.4* 3.4* 3.7 3.7  CL 101 104 105 107 103  CO2 25 19* 22 23 24   GLUCOSE 137* 153* 159* 163* 169*  BUN 31* 28* 26* 24* 26*  CREATININE 5.17* 3.86* 2.85* 2.42* 1.93*  CALCIUM 8.2* 8.4* 8.3* 8.4* 8.2*  PHOS  --   --   --   --  4.2    Liver Function Tests: Recent Labs  Lab 10/11/18 0352  ALBUMIN 2.3*   No results for input(s): LIPASE, AMYLASE in the last 168 hours. No results for input(s): AMMONIA in the last 168 hours.  CBC: Recent Labs  Lab 10/05/18 0506 10/11/18 0352  WBC 8.3 7.6  NEUTROABS 4.3  --    HGB 7.8* 7.0*  HCT 24.3* 22.5*  MCV 82.1 82.4  PLT 354 377    Cardiac Enzymes: Recent Labs  Lab 10/05/18 0506 10/08/18 0547  CKTOTAL 92 86    BNP: Invalid input(s): POCBNP  CBG: Recent Labs  Lab 10/10/18 1140 10/10/18 1641 10/10/18 2117 10/11/18 0820 10/11/18 1217  GLUCAP 158* 169* 115* 125* 112*    Microbiology: Results for orders placed or performed during the hospital encounter of 09/12/18  Blood Culture (routine x 2)     Status: None   Collection Time: 09/12/18  9:56 AM   Specimen: BLOOD LEFT HAND  Result Value Ref Range Status   Specimen Description BLOOD LEFT HAND  Final   Special Requests   Final    BOTTLES DRAWN AEROBIC AND ANAEROBIC Blood Culture adequate volume   Culture   Final    NO GROWTH 5 DAYS Performed at [facility], [address] [place], [place] [address]    Report Status 09/17/2018 FINAL  Final  Urine culture     Status: None   Collection Time: 09/12/18 10:01 AM   Specimen: Urine, Random  Result Value Ref Range Status   Specimen Description   Final    URINE, RANDOM Performed at [facility], [address] [place], [place] [address]  Special Requests   Final    NONE Performed at [facility], [address], [place], [place] [address]    Culture   Final    NO GROWTH Performed at [facility], [place] [address] [place], [place] [address]    Report Status 09/13/2018 FINAL  Final  SARS Coronavirus 2 (CEPHEID- Performed in [place] hospital lab), Hosp Order     Status: None   Collection Time: 09/12/18 10:02 AM   Specimen: Nasopharyngeal Swab  Result Value Ref Range Status   SARS Coronavirus 2 NEGATIVE NEGATIVE Final    Comment: (NOTE) If result is NEGATIVE SARS-CoV-2 target nucleic acids are NOT DETECTED. The SARS-CoV-2 RNA is generally detectable in upper and lower  respiratory specimens during the acute phase of infection. The lowest  concentration of SARS-CoV-2 viral copies this assay  can detect is 250  copies / mL. A negative result does not preclude SARS-CoV-2 infection  and should not be used as the sole basis for treatment or other  patient management decisions.  A negative result may occur with  improper specimen collection / handling, submission of specimen other  than nasopharyngeal swab, presence of viral mutation(s) within the  areas targeted by this assay, and inadequate number of viral copies  (<250 copies / mL). A negative result must be combined with clinical  observations, patient history, and epidemiological information. If result is POSITIVE SARS-CoV-2 target nucleic acids are DETECTED. The SARS-CoV-2 RNA is generally detectable in upper and lower  respiratory specimens dur ing the acute phase of infection.  Positive  results are indicative of active infection with SARS-CoV-2.  Clinical  correlation with patient history and other diagnostic information is  necessary to determine patient infection status.  Positive results do  not rule out bacterial infection or co-infection with other viruses. If result is PRESUMPTIVE POSTIVE SARS-CoV-2 nucleic acids MAY BE PRESENT.   A presumptive positive result was obtained on the submitted specimen  and confirmed on repeat testing.  While 2019 novel coronavirus  (SARS-CoV-2) nucleic acids may be present in the submitted sample  additional confirmatory testing may be necessary for epidemiological  and / or clinical management purposes  to differentiate between  SARS-CoV-2 and other Sarbecovirus currently known to infect humans.  If clinically indicated additional testing with an alternate test  methodology [phone]) is advised. The SARS-CoV-2 RNA is generally  detectable in upper and lower respiratory sp ecimens during the acute  phase of infection. The expected result is Negative. Fact Sheet for Patients:  StrictlyIdeas.no Fact Sheet for Healthcare  Providers: BankingDealers.co.za This test is not yet approved or cleared by the [place] FDA and has been authorized for detection and/or diagnosis of SARS-CoV-2 by FDA under an Emergency Use Authorization (EUA).  This EUA will remain in effect (meaning this test can be used) for the duration of the COVID-19 declaration under Section 564(b)(1) of the Act, 21 U.S.C. section 360bbb-3(b)(1), unless the authorization is terminated or revoked sooner. Performed at [facility], [address] [place], [place] [address]   Blood Culture (routine x 2)     Status: None   Collection Time: 09/12/18 10:06 AM   Specimen: BLOOD  Result Value Ref Range Status   Specimen Description BLOOD LEFT ANTECUBITAL  Final   Special Requests   Final    BOTTLES DRAWN AEROBIC AND ANAEROBIC Blood Culture adequate volume   Culture   Final    NO GROWTH 5 DAYS Performed at [facility],  [place], [place] [address]    Report Status 09/17/2018 FINAL  Final  MRSA PCR Screening     Status: None   Collection Time: 09/12/18  7:04 PM   Specimen: Nasal Mucosa; Nasopharyngeal  Result Value Ref Range Status   MRSA by PCR NEGATIVE NEGATIVE Final    Comment:        The GeneXpert MRSA Assay (FDA approved for NASAL specimens only), is one component of a comprehensive MRSA colonization surveillance program. It is not intended to diagnose MRSA infection nor to guide or monitor treatment for MRSA infections. Performed at [facility], [address] [place], [place] [address]   C difficile quick scan w PCR reflex     Status: None   Collection Time: 09/14/18  2:21 PM   Specimen: STOOL  Result Value Ref Range Status   C Diff antigen NEGATIVE NEGATIVE Final   C Diff toxin NEGATIVE NEGATIVE Final   C Diff interpretation No C. difficile detected.  Final    Comment: Performed at [facility], [address] [place], [place] [address]  Group  A Strep by PCR     Status: None   Collection Time: 09/15/18  1:53 PM  Result Value Ref Range Status   Group A Strep by PCR NOT DETECTED NOT DETECTED Final    Comment: Performed at [facility], [address] [place], [place] [address]  Body fluid culture     Status: None   Collection Time: 09/15/18  5:13 PM   Specimen: KNEE  Result Value Ref Range Status   Specimen Description   Final    KNEE Performed at [facility], [address], [place], [place] [address]    Special Requests   Final    NONE Performed at [facility], [address], [place], [place] [address]    Gram Stain   Final    RARE RBCS FEW WBC SEEN NO ORGANISMS SEEN Performed at [facility], [address] [place], [place] [address]    Culture   Final    NO GROWTH 3 DAYS Performed at [facility], [place] [address] [place], [place] [address]    Report Status 09/19/2018 FINAL  Final  Body fluid culture     Status: None   Collection Time: 09/16/18  8:55 AM   Specimen: KNEE  Result Value Ref Range Status   Specimen Description KNEE RIGHT  Final   Special Requests NONE  Final   Gram Stain   Final    RARE WBC PRESENT, PREDOMINANTLY PMN NO ORGANISMS SEEN    Culture   Final    NO GROWTH 3 DAYS Performed at [facility], [place] [address] [place], [place] [address]    Report Status 09/20/2018 FINAL  Final  Aerobic/Anaerobic Culture (surgical/deep wound)     Status: None   Collection Time: 09/17/18 11:32 AM   Specimen: Wound  Result Value Ref Range Status   Specimen Description   Final    TISSUE LEFT KNEE 1 Performed at [facility], [address] [place], [place] [address]    Special Requests PATIENT ON FOLLOWING MAXIPIME VANCOMYCIN  Final   Gram Stain   Final    FEW WBC PRESENT,BOTH PMN AND MONONUCLEAR NO ORGANISMS SEEN    Culture   Final    No growth aerobically or anaerobically. Performed at [facility], [place] [address]  [place],  [address]  Report Status 09/22/2018 FINAL  Final  Acid Fast Smear (AFB)     Status: None   Collection Time: 09/17/18 11:32 AM   Specimen: Synovium  Result Value Ref Range Status   AFB Specimen Processing Comment  Final    Comment: Tissue Grinding and Digestion/Decontamination   Acid Fast Smear Negative  Final    Comment: (NOTE) Performed At: [facility] [place], [place] [id number] Rush Farmer MD [id number]    Source (AFB) TISSUE  Final    Comment: LEFT KNEE Performed at [facility], [place] [address], [place], [place] [address]   Culture, fungus without smear     Status: None   Collection Time: 09/17/18 11:34 AM   Specimen: Synovium; Other  Result Value Ref Range Status   Specimen Description   Final    SYNOVIAL Performed at [facility], [address], [place], [place] [address]    Special Requests   Final    NONE Performed at [facility], [address] [place], [place] [address]    Culture   Final    NO FUNGUS ISOLATED AFTER 21 DAYS Performed at [facility], [place] [address] [place], [place] [address]    Report Status 10/11/2018 FINAL  Final  Acid Fast Culture with reflexed sensitivities     Status: None   Collection Time: 09/17/18 11:36 AM   Specimen: Synovium  Result Value Ref Range Status   Acid Fast Culture CANC  Final    Comment: (NOTE) Test cancelled at client's request.      Cancelled per Levin Erp 09/21/2018 Performed At: [facility] [place], [place] [id number] Rush Farmer MD [id number]    Source of Sample TISSUE  Final    Comment: RIGHT KNEE Performed at [facility], [place] [address], [place], [place] [address]   Aerobic/Anaerobic Culture (surgical/deep wound)     Status: None   Collection Time: 09/17/18 11:43 AM   Specimen: Wound  Result Value Ref Range Status   Specimen Description   Final    TISSUE LEFT KNEE 2 Performed at  [facility], [address] [place], [place] [address]    Special Requests PATIENT ON FOLLOWING MAXIPIME VANCOMYCIN  Final   Gram Stain   Final    FEW WBC PRESENT, PREDOMINANTLY PMN NO ORGANISMS SEEN    Culture   Final    No growth aerobically or anaerobically. Performed at [facility], [place] [address] [place], [place] [address]    Report Status 09/22/2018 FINAL  Final  Aerobic/Anaerobic Culture (surgical/deep wound)     Status: None   Collection Time: 09/17/18 11:44 AM   Specimen: Wound  Result Value Ref Range Status   Specimen Description   Final    TISSUE LEFT KNEE 3 Performed at [facility], [address] [place], [place] [address]    Special Requests PATIENT ON FOLLOWING MAXIPIME VANCOMYCIN  Final   Gram Stain   Final    MODERATE WBC PRESENT, PREDOMINANTLY PMN NO ORGANISMS SEEN    Culture   Final    No growth aerobically or anaerobically. Performed at [facility], [place] [address] [place], [place] [address]    Report Status 09/22/2018 FINAL  Final  Aerobic/Anaerobic Culture (surgical/deep wound)     Status: None   Collection Time: 09/18/18  9:43 PM   Specimen: [facility] Other; Tissue  Result Value Ref Range Status   Specimen Description  Final    SYNOVIAL Performed at [facility], [address] [place], [place] [address]    Special Requests   Final    NONE Performed at [facility], [address] [place], [place] [address]    Gram Stain   Final    RARE WBC PRESENT, PREDOMINANTLY MONONUCLEAR NO ORGANISMS SEEN    Culture   Final    No growth aerobically or anaerobically. Performed at [facility], [place] [address] [place], [place] [address]    Report Status 09/24/2018 FINAL  Final  Aerobic/Anaerobic Culture (surgical/deep wound)     Status: None   Collection Time: 09/18/18  9:44 PM   Specimen: [facility] Other; Tissue  Result Value Ref Range Status   Specimen Description   Final    SYNOVIAL Performed  at [facility], [address], [place], [place] [address]    Special Requests   Final    RIGHT KNEE Performed at [facility], [place], [place] [address]    Gram Stain   Final    FEW WBC PRESENT,BOTH PMN AND MONONUCLEAR NO ORGANISMS SEEN    Culture   Final    No growth aerobically or anaerobically. Performed at [facility], [place] [address], [place], [place] [address]    Report Status 09/24/2018 FINAL  Final  Aerobic/Anaerobic Culture (surgical/deep wound)     Status: None   Collection Time: 09/18/18  9:45 PM   Specimen: [facility] Other; Tissue  Result Value Ref Range Status   Specimen Description   Final    SYNOVIAL Performed at [facility], [address], [place], [place] [address]    Special Requests   Final    RIGHT KNEE Performed at [facility], [place] [address] [place], [place] [address]    Gram Stain   Final    FEW WBC PRESENT, PREDOMINANTLY MONONUCLEAR NO ORGANISMS SEEN    Culture   Final    No growth aerobically or anaerobically. Performed at [facility], [place] [address], [place], [place] [address]    Report Status 09/24/2018 FINAL  Final  Culture, blood (Routine X 2) w Reflex to ID Panel     Status: None   Collection Time: 09/19/18  1:11 PM   Specimen: BLOOD  Result Value Ref Range Status   Specimen Description BLOOD LEFT ANTECUBITAL  Final   Special Requests   Final    BOTTLES DRAWN AEROBIC AND ANAEROBIC Blood Culture results may not be optimal due to an excessive volume of blood received in culture bottles   Culture   Final    NO GROWTH 5 DAYS Performed at [facility], [address] [place], [place] [address]    Report Status 09/24/2018 FINAL  Final  Culture, blood (Routine X 2) w Reflex to ID Panel     Status: None   Collection Time: 09/19/18  1:20 PM   Specimen: BLOOD  Result Value Ref Range Status   Specimen Description BLOOD BLOOD RIGHT HAND  Final   Special Requests    Final    BOTTLES DRAWN AEROBIC AND ANAEROBIC Blood Culture results may not be optimal due to an excessive volume of blood received in culture bottles   Culture   Final    NO GROWTH 5 DAYS Performed at [facility], [address] [place], [place] [address]    Report Status 09/24/2018 FINAL  Final  Urine Culture  Status: None   Collection Time: 09/20/18  3:59 PM   Specimen: Urine, Random  Result Value Ref Range Status   Specimen Description   Final    URINE, RANDOM Performed at [facility], [address] [place], [place] [address]    Special Requests   Final    NONE Performed at [facility], [address] [place], [place] [address]    Culture   Final    NO GROWTH Performed at [facility], [place] [address], [place], [place] [address]    Report Status 09/21/2018 FINAL  Final  C difficile quick scan w PCR reflex     Status: None   Collection Time: 09/21/18  3:27 PM   Specimen: STOOL  Result Value Ref Range Status   C Diff antigen NEGATIVE NEGATIVE Final   C Diff toxin NEGATIVE NEGATIVE Final   C Diff interpretation No C. difficile detected.  Final    Comment: Performed at [facility], [address] [place], [place] [address]  Gastrointestinal Panel by PCR , Stool     Status: None   Collection Time: 09/21/18  3:27 PM   Specimen: Stool  Result Value Ref Range Status   Campylobacter species NOT DETECTED NOT DETECTED Final   Plesimonas shigelloides NOT DETECTED NOT DETECTED Final   Salmonella species NOT DETECTED NOT DETECTED Final   Yersinia enterocolitica NOT DETECTED NOT DETECTED Final   Vibrio species NOT DETECTED NOT DETECTED Final   Vibrio cholerae NOT DETECTED NOT DETECTED Final   Enteroaggregative E coli (EAEC) NOT DETECTED NOT DETECTED Final   Enteropathogenic E coli (EPEC) NOT DETECTED NOT DETECTED Final   Enterotoxigenic E coli (ETEC) NOT DETECTED NOT DETECTED Final   Shiga like toxin producing E coli (STEC) NOT  DETECTED NOT DETECTED Final   Shigella/Enteroinvasive E coli (EIEC) NOT DETECTED NOT DETECTED Final   Cryptosporidium NOT DETECTED NOT DETECTED Final   Cyclospora cayetanensis NOT DETECTED NOT DETECTED Final   Entamoeba histolytica NOT DETECTED NOT DETECTED Final   Giardia lamblia NOT DETECTED NOT DETECTED Final   Adenovirus F40/41 NOT DETECTED NOT DETECTED Final   Astrovirus NOT DETECTED NOT DETECTED Final   Norovirus GI/GII NOT DETECTED NOT DETECTED Final   Rotavirus A NOT DETECTED NOT DETECTED Final   Sapovirus (I, II, IV, and V) NOT DETECTED NOT DETECTED Final    Comment: Performed at [facility], [address] [place], [place] [address]  Novel Coronavirus, NAA (hospital order; send-out to ref lab)     Status: None   Collection Time: 09/29/18  4:36 PM   Specimen: Nasopharyngeal Swab; Respiratory  Result Value Ref Range Status   SARS-CoV-2, NAA NOT DETECTED NOT DETECTED Final    Comment: (NOTE) This test was developed and its performance characteristics determined by [employer]. This test has not been FDA cleared or approved. This test has been authorized by FDA under an Emergency Use Authorization (EUA). This test is only authorized for the duration of time the declaration that circumstances exist justifying the authorization of the emergency use of in vitro diagnostic tests for detection of SARS-CoV-2 virus and/or diagnosis of COVID-19 infection under section 564(b)(1) of the Act, 21 U.S.C. 741OIN-8(M)(7), unless the authorization is terminated or revoked sooner. When diagnostic testing is negative, the possibility of a false negative result should be considered in the context of a patient's recent exposures and the presence of clinical signs and symptoms consistent with COVID-19. An individual without symptoms of COVID-19 and  who is not shedding SARS-CoV-2 virus would expect to have a negative (not detected) result in this assay. Performed  At: [facility] [address] [place], [place] [id number] Rush Farmer MD NW:[id number]    [place]  Final    Comment: Performed at [facility], [address] [place], [place] [address]  CULTURE, BLOOD (ROUTINE X 2) w Reflex to ID Panel     Status: None   Collection Time: 10/02/18  5:26 PM   Specimen: BLOOD  Result Value Ref Range Status   Specimen Description BLOOD BLOOD RIGHT FOREARM  Final   Special Requests   Final    BOTTLES DRAWN AEROBIC AND ANAEROBIC Blood Culture adequate volume   Culture   Final    NO GROWTH 5 DAYS Performed at [facility], [address] [place], [place] [address]    Report Status 10/07/2018 FINAL  Final  CULTURE, BLOOD (ROUTINE X 2) w Reflex to ID Panel     Status: None   Collection Time: 10/02/18  5:41 PM   Specimen: BLOOD  Result Value Ref Range Status   Specimen Description BLOOD BLOOD RIGHT HAND  Final   Special Requests   Final    BOTTLES DRAWN AEROBIC AND ANAEROBIC Blood Culture adequate volume   Culture   Final    NO GROWTH 5 DAYS Performed at [facility], [address], [place], [place] [address]    Report Status 10/07/2018 FINAL  Final    Coagulation Studies: Recent Labs    10/09/18 0407 10/10/18 0549 10/11/18 0352  LABPROT 24.8* 22.4* 20.8*  INR 2.3* 2.0* 1.8*    Urinalysis: No results for input(s): COLORURINE, LABSPEC, PHURINE, GLUCOSEU, HGBUR, BILIRUBINUR, KETONESUR, PROTEINUR, UROBILINOGEN, NITRITE, LEUKOCYTESUR in the last 72 hours.  Invalid input(s): APPERANCEUR    Imaging: No results found.   Medications:   . sodium chloride 5 mL/hr at 09/29/18 0242  . sodium chloride 0 mL/hr at 09/30/18 1534  . anticoagulant sodium citrate    . DAPTOmycin (CUBICIN)  IV 700 mg (10/11/18 1347)   . aspirin EC  81 mg Oral Daily  . Chlorhexidine Gluconate Cloth  6 each Topical Q0600  . citalopram  10 mg Oral Daily  . epoetin (EPOGEN/PROCRIT) injection  20,000 Units  Intravenous Weekly  . feeding supplement (ENSURE ENLIVE)  237 mL Oral BID BM  . fludrocortisone  0.1 mg Oral BID  . fluticasone furoate-vilanterol  1 puff Inhalation Daily  . folic acid  1 mg Oral Daily  . hydrocortisone  20 mg Oral BID  . insulin aspart  0-5 Units Subcutaneous QHS  . insulin aspart  0-9 Units Subcutaneous TID WC  . levofloxacin  500 mg Oral Daily  . mouth rinse  15 mL Mouth Rinse BID  . multivitamin with minerals  1 tablet Oral Daily  . omega-3 acid ethyl esters  1 g Oral Daily  . sodium chloride flush  3 mL Intravenous Q12H  . warfarin  7.5 mg Oral ONCE-1800  . Warfarin - Pharmacist Dosing Inpatient   Does not apply q1800   sodium chloride, sodium chloride, acetaminophen **OR** acetaminophen, alum & mag hydroxide-simeth, anticoagulant sodium citrate, HYDROcodone-acetaminophen, iohexol, ipratropium-albuterol, menthol-cetylpyridinium **OR** phenol, ondansetron **OR** ondansetron (ZOFRAN) IV, sodium chloride flush  Assessment/ Plan:  [name] is a 58 y.o. black male with hyeprtension, COPD, diabetes mellitus type II who underwent bilateral knee replacement surgeries on 5/15 at [facility]. who was admitted to [facility] on 09/12/2018 for septic  arthritis. Hospital course with left knee surgery on 5/31 and right knee surgery on 6/1 by Dr. Harlow Mares.   1. Acute renal failure  baseline creatinine of 1.07 with normal GFR in 06/12/18.  Acute renal failure secondary to ATN. Required hemodialysis. First treatment was 6/9. Last hemodialysis treatment 6/12.  Continues to have improving renal function and nonoliguric urine output.  Dialysis access has been removed. No indication for further dialysis.  Monitor lab results closely Creatinine is down to 1.93  2. Hypotension and Hypokalemia: with concern of adrenal insufficiency although would be expected to have hyperkalemia  BP acceptable MRI with 5.1 cm left adrenal mass, 3.8 cm right adrenal mass.  Nonspecific  characteristics.  Concern about adrenal hemorrhage - Will need  Further workup as an outpatient - fludrocortisone and hydrocortisone were being given.  Blood pressure was elevated therefore fludrocortisone was discontinued however, orthostatic drop in blood pressure is noted.  May need to restart fludrocortisone if remains orthostatic.  3. Anemia with renal failure: Lab Results  Component Value Date   HGB 7.0 (L) 10/11/2018  procrit weekly     LOS: 29 Amberlin Utke 6/24/20202:43 PM

## 2018-10-11 NOTE — Telephone Encounter (Signed)
Referral has been sent to [facility] Endocrinology.

## 2018-10-12 LAB — CBC
HCT: 23.9 % — ABNORMAL LOW (ref 39.0–52.0)
Hemoglobin: 7.4 g/dL — ABNORMAL LOW (ref 13.0–17.0)
MCH: 26.1 pg (ref 26.0–34.0)
MCHC: 31 g/dL (ref 30.0–36.0)
MCV: 84.5 fL (ref 80.0–100.0)
Platelets: 491 10*3/uL — ABNORMAL HIGH (ref 150–400)
RBC: 2.83 MIL/uL — ABNORMAL LOW (ref 4.22–5.81)
RDW: 16.4 % — ABNORMAL HIGH (ref 11.5–15.5)
WBC: 7.5 10*3/uL (ref 4.0–10.5)
nRBC: 0 % (ref 0.0–0.2)

## 2018-10-12 LAB — CK: Total CK: 485 U/L — ABNORMAL HIGH (ref 49–397)

## 2018-10-12 LAB — BASIC METABOLIC PANEL
Anion gap: 10 (ref 5–15)
BUN: 22 mg/dL — ABNORMAL HIGH (ref 6–20)
CO2: 23 mmol/L (ref 22–32)
Calcium: 8.6 mg/dL — ABNORMAL LOW (ref 8.9–10.3)
Chloride: 105 mmol/L (ref 98–111)
Creatinine, Ser: 1.71 mg/dL — ABNORMAL HIGH (ref 0.61–1.24)
GFR calc Af Amer: 49 mL/min — ABNORMAL LOW (ref >60)
GFR calc non Af Amer: 43 mL/min — ABNORMAL LOW (ref >60)
Glucose, Bld: 134 mg/dL — ABNORMAL HIGH (ref 70–99)
Potassium: 3.6 mmol/L (ref 3.5–5.1)
Sodium: 138 mmol/L (ref 135–145)

## 2018-10-12 LAB — GLUCOSE, CAPILLARY
Glucose-Capillary: 128 mg/dL — ABNORMAL HIGH (ref 70–99)
Glucose-Capillary: 147 mg/dL — ABNORMAL HIGH (ref 70–99)

## 2018-10-12 LAB — PROTIME-INR
INR: 1.8 — ABNORMAL HIGH (ref 0.8–1.2)
Prothrombin Time: 20.4 seconds — ABNORMAL HIGH (ref 11.4–15.2)

## 2018-10-12 LAB — MISCELLANEOUS TEST

## 2018-10-12 MED ORDER — FLUDROCORTISONE ACETATE 0.1 MG PO TABS: 0.1000 mg | ORAL_TABLET | Freq: Two times a day (BID) | ORAL | 0 refills | Status: DC

## 2018-10-12 MED ORDER — DOXYCYCLINE HYCLATE 100 MG PO TABS: 100.0000 mg | ORAL_TABLET | Freq: Two times a day (BID) | ORAL | 0 refills | Status: DC

## 2018-10-12 MED ORDER — LEVOFLOXACIN 500 MG PO TABS: 500.0000 mg | ORAL_TABLET | Freq: Every day | ORAL | 0 refills | Status: AC

## 2018-10-12 MED ORDER — WARFARIN SODIUM 5 MG PO TABS: 5.0000 mg | ORAL_TABLET | Freq: Every day | ORAL | 0 refills | Status: DC

## 2018-10-12 MED ORDER — HYDROCORTISONE 20 MG PO TABS: 20.0000 mg | ORAL_TABLET | Freq: Two times a day (BID) | ORAL | 0 refills | Status: DC

## 2018-10-12 MED ORDER — RISAQUAD PO CAPS: 1.0000 | ORAL_CAPSULE | Freq: Every day | ORAL | 0 refills | Status: DC

## 2018-10-12 MED ORDER — HYDROCODONE-ACETAMINOPHEN 5-325 MG PO TABS: 1.0000 | ORAL_TABLET | Freq: Four times a day (QID) | ORAL | 0 refills | Status: DC | PRN

## 2018-10-12 MED ORDER — WARFARIN SODIUM 7.5 MG PO TABS
7.5000 mg | ORAL_TABLET | Freq: Once | ORAL | Status: AC
  Administered 2018-10-12: 7.5 mg via ORAL
  Filled 2018-10-12: qty 1

## 2018-10-12 NOTE — Progress Notes (Signed)
Occupational Therapy Treatment Patient Details Name: Isaac Cross MRN: [id number] DOB: 08-07-1960 Today's Date: 10/12/2018    History of present illness Pt is a 58 y.o. male presenting to hospital 09/12/18 after rolling OOB landing on L knee; pt with increased dizziness x2 days, fevers x3 days, and generalized weakness.  S/p B TKR 09/01/18 in [place] (pt reports discharging home same day).  Pt admitted with sepsis, acute renal failure, and B TKR.  Pt s/p L knee 5/31 and R knee 6/1 I&D with poly exchange.  Pt noted with severe encephalopathy from DT's with extensive ETOH abuse.  L LE DVT noted.  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  HD started 6/5 with L IJ temporary HD catheter.  PMH includes B TKR 09/01/18 in [place], DM, asthma, R vein surgery, COPD. Right IJ line removed 6/16. 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   OT comments  Pt seen for OT treatment on this date. Upon arrival to room pt awake/alert. Pt seated upright in room recliner. Pt endorsing no pain and agreeable to tx. Pt stating he is eager to go home as he had just spoken with his MD who stated he would be DC on this date. Pt declined CIR. Focused session on problem solving at-home ADLs as pt has eminent discharge. Pt currently requiring at least min assist to complete toilet and tub transfers as well as LB ADL tasks. Pt provided with reinforcement on prior education regarding falls prevention strategies and safe use of AE/DME during daily routines and with emphasis on safety during bathing and functional mobility. Pt verbalized understanding of education provided and verbalized his personal concerns/limitations once home. Pt and provider problem solved strategies for improved safety and maximal functional independence. Pt and provider also discussed options for safe attachable bed rails as well as pet mgt strategies upon his return home. Pt making good progress toward goals. Pt continues to benefit from  skilled OT services to maximize return to PLOF and minimize risk of future falls, injury, caregiver burden, and readmission. Will continue to follow POC. Discharge recommendation remains appropriate.    Follow Up Recommendations  CIR    Equipment Recommendations  Other (comment)(Bari BSC)    Recommendations for Other Services Rehab consult    Precautions / Restrictions Precautions Precautions: Fall Precaution Comments: watch orthostatics Restrictions Weight Bearing Restrictions: Yes RLE Weight Bearing: Weight bearing as tolerated LLE Weight Bearing: Weight bearing as tolerated Other Position/Activity Restrictions: WBAT B LE's       Mobility Bed Mobility Overal bed mobility: Modified Independent Bed Mobility: Supine to Sit Rolling: Modified independent (Device/Increase time)   Supine to sit: Modified independent (Device/Increase time)     General bed mobility comments: Able to come to bedside without physical assistance, slowly and with use of bedrail.  Transfers Overall transfer level: Needs assistance Equipment used: Rolling walker (2 wheeled) Transfers: Sit to/from Stand Sit to Stand: Min guard;+2 safety/equipment         General transfer comment: Two person assistance for safety provided.  Pt able to stand from bedside without physical assistance.  VC's to place hands on bed when standing.    Balance Overall balance assessment: Needs assistance Sitting-balance support: No upper extremity supported;Feet supported Sitting balance-Leahy Scale: Good Sitting balance - Comments: Steady weight shifting/reaching within BOS.   Standing balance support: Bilateral upper extremity supported Standing balance-Leahy Scale: Fair  ADL either performed or assessed with clinical judgement   ADL Overall ADL's : Needs assistance/impaired                                             Vision Baseline Vision/History:  Wears glasses Wears Glasses: At all times Patient Visual Report: No change from baseline     Perception     Praxis      Cognition Arousal/Alertness: Awake/alert Behavior During Therapy: WFL for tasks assessed/performed Overall Cognitive Status: Within Functional Limits for tasks assessed                                 General Comments: Pt better able to follow directions and conversate today.  No delay in processing commands.        Exercises Other Exercises Other Exercises: Reviewed ther ex and discussed importance of HEP performance frequently to return to PLOF. Other Exercises: Pt and provider problem solved strategies for mgt of ADLs in the home including LB dressing/bathing, functional mobility within his home environment, safe use of AE/DME, and pet mgt strategies.   Shoulder Instructions       General Comments Pt asymptomatic for hypotension today.  MD noted that BP has been stabilized prior to treatment.    Pertinent Vitals/ Pain       Pain Assessment: No/denies pain  Home Living                                          Prior Functioning/Environment              Frequency  Min 3X/week        Progress Toward Goals  OT Goals(current goals can now be found in the care plan section)  Progress towards OT goals: Progressing toward goals  Acute Rehab OT Goals Patient Stated Goal: To go home and to return to the gym eventually. OT Goal Formulation: With patient Time For Goal Achievement: 10/23/18 Potential to Achieve Goals: Good  Plan Discharge plan remains appropriate;Frequency remains appropriate    Co-evaluation                 AM-PAC OT "6 Clicks" Daily Activity     Outcome Measure   Help from another person eating meals?: None Help from another person taking care of personal grooming?: None Help from another person toileting, which includes using toliet, bedpan, or urinal?: A Little Help from another  person bathing (including washing, rinsing, drying)?: A Little Help from another person to put on and taking off regular upper body clothing?: A Little Help from another person to put on and taking off regular lower body clothing?: A Little 6 Click Score: 20    End of Session    OT Visit Diagnosis: Other abnormalities of gait and mobility (R26.89);History of falling (Z91.81)   Activity Tolerance Patient tolerated treatment well   Patient Left in chair;with chair alarm set;with call bell/phone within reach   Nurse Communication          Time: 2263-3354 OT Time Calculation (min): 24 min  Charges: OT General Charges $OT Visit: 1 Visit OT Treatments $Self Care/Home Management : 23-37 mins  Shara Blazing, M.S., OTR/L Ascom: [phone] 10/12/18,  2:59 PM

## 2018-10-12 NOTE — Discharge Summary (Signed)
[facility] [place] NAME: Isaac Cross    MR#:  [id number]  DATE OF BIRTH:  1960/10/14  DATE OF ADMISSION:  09/12/2018 ADMITTING PHYSICIAN: Gladstone Lighter, MD  DATE OF DISCHARGE: 10/12/2018   PRIMARY CARE PHYSICIAN: Steele Sizer, MD    ADMISSION DIAGNOSIS:  ARF (acute renal failure) ([place]) [N17.9] Sepsis ([place]) [A41.9]  DISCHARGE DIAGNOSIS:  Principal Problem:   Sepsis ([place]) Active Problems:   Obstructive apnea   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HCC)   Chronic obstructive asthma ([place])   Depression with anxiety   Acute renal failure ([place])   Septic shock ([place])   SECONDARY DIAGNOSIS:   Past Medical History:  Diagnosis Date  . Allergy   . Anxiety   . Arthritis    knees  . Asthma, mild intermittent, well-controlled   . Chronic obstructive asthma ([place])   . COPD (chronic obstructive pulmonary disease) ([place])   . Depression with anxiety   . Diabetes ([place])   . GERD (gastroesophageal reflux disease)   . HBP (high blood pressure)   . High cholesterol   . Renal cyst, right   . Sleep apnea    CPAP  . Swelling     HOSPITAL COURSE:   * Sepsis Resolved Suspected due to seeding of bilateral knee replacements, s/p recent bilateral knee surgeries, s/p bilateral knee washouts done this admission Fluid Cultures neg - but patient was on antibiotics prior to his washout and sampling Fungal cultures negative - completed 7 days of anidulafungin  Continue daptomycin for 4 more days, Levaquin until 10/23/2018 per ID, SNF unable to take patient due to expensive medications, discussed with case management/social work to see if antibiotics can be done as outpatient As it is only 3 to 4 days of IV antibiotics needed, we decided to finish the course over here and then sent home. As per ID we can stop IV antibiotics as he would be finishing 26 days of antibiotics.  After that continue 2 more weeks of oral  antibiotics.  *Acute non - Oliguric renal failure Resolving slowly-creatinine 2.4 down from 2.8 likely ATN from sepsis/hypotension Nephrology input greatly appreciated, s/p hemodialysis 09/22/2018, continue to hold lisinopril/metformin, avoid nephrotoxic agents, strict I&O monitoring, daily weights, BMP daily -To follow renal function in the next 1 to 2 weeks.  * Acute encephalopathy Resolved secondary to metabolic causes, sepsis and uremia S/P delirium tremens for period of time which was treated in the ICU  MRi Brain - Nothing acute Neurology/Dr. 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  * Diabetes mellitus-2 Continue sliding scale, hold Lantus.  *Primary adrenal insufficiency Low cortisol level.  High ACTH level.  No response to cosyntropin dose. 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  Continue Florinef and hydrocortisone Will need outpt F/U with Endocrinology s/p discharge-spoke to Dr.Ocanolle-at [facility]-he suggested to continue hydrocortisone and Florinef and follow-up in his clinic next week.  * Anemia of chronic disease Hemoglobin slowly drifting down likely due to kidney disease, started on Epogen injection by nephrologist.  * Hypokalemia Replete   *Hypomagnesemia Repleted  *Orthostatic hypotension Continue Florinef as advised by endocrinologist. Blood pressure much stable and patient felt no dizziness and able to walk 200 steps around nurses station so will discharge home with home health.  DVT prophylaxis-on  Coumadin   DISCHARGE CONDITIONS:   Stable  CONSULTS OBTAINED:  Treatment Team:  Lovell Sheehan, MD Lavonia Dana, MD Sindy Guadeloupe, MD Neville Route, MD  DRUG ALLERGIES:   Allergies  Allergen Reactions  . 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 Lipitor [Atorvastatin] Hives and Itching  . Other   . 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   Lipitor [atorvastatin] Hives, Itching   Other    Viagra  [sildenafil Citrate]    vision loss      Medication List    STOP taking these medications   atenolol 25 MG tablet Commonly known as: TENORMIN   cephALEXin 500 MG capsule Commonly known as: KEFLEX   oxyCODONE-acetaminophen 5-325 MG tablet Commonly known as: PERCOCET/ROXICET   rosuvastatin 20 MG tablet Commonly known as: CRESTOR   sulfamethoxazole-trimethoprim 800-160 MG tablet Commonly known as: BACTRIM DS   Xarelto 10 MG Tabs tablet Generic drug: rivaroxaban     TAKE these medications   acetaminophen 500 MG tablet Commonly known as: TYLENOL Take 1 tablet (500 mg total) by mouth every 6 (six) hours as needed. Notes to patient: Not given in hospital   acidophilus Caps capsule Take 1 capsule by mouth daily. Start taking on: October 13, 2018 Notes to patient: Last dose given this AM at 8:40   albuterol 108 (90 Base) MCG/ACT inhaler Commonly known as: VENTOLIN HFA INHALE 2 PUFFS INTO THE LUNGS 4 (FOUR) TIMES DAILY. Notes to patient: Not given in hospital   aspirin 81 MG tablet Take 81 mg by mouth daily. Notes to patient: Last dose given this AM at 8:38   cetirizine 10 MG tablet Commonly known as: ZYRTEC Take 10 mg by mouth daily. Notes to patient: Not given in hospital   citalopram 20 MG tablet Commonly known as: CELEXA Take 1 tablet (20 mg total) by mouth daily.   diclofenac sodium 1 % Gel Commonly known as: VOLTAREN APPLY 4 GRAMS TOPICALLY FOUR TIMES A DAY Notes to patient: Not given in hospital   doxycycline 100 MG tablet Commonly known as: VIBRA-TABS Take 1 tablet (100 mg total) by mouth every 12 (twelve)  hours. Notes to patient: Last dose given this AM at 8:38   Fish Oil 1000 MG Caps Take by mouth daily.   fludrocortisone 0.1 MG tablet Commonly known as: FLORINEF Take 1 tablet (0.1 mg total) by mouth 2 (two) times daily.   fluticasone furoate-vilanterol 100-25 MCG/INH Aepb Commonly known as: Breo Ellipta Inhale 1 puff into the lungs daily.   HYDROcodone-acetaminophen 5-325 MG tablet Commonly known as: NORCO/VICODIN Take 1-2 tablets by mouth every 6 (six) hours as needed for moderate pain.   hydrocortisone 20 MG tablet Commonly known as: CORTEF Take 1 tablet (20 mg total) by mouth 2 (two) times daily.   levofloxacin 500 MG tablet Commonly known as: LEVAQUIN Take 1 tablet (500 mg total) by mouth daily for 14 days. Start taking on: October 13, 2018   lidocaine 2 % solution Commonly known as: XYLOCAINE Use as directed 15 mLs in the mouth or throat as needed for mouth pain.   Mens Multivitamin Plus Tabs Take by mouth.   Senna-Plus 8.6-50 MG tablet Generic drug: senna-docusate Take 2 tablets by mouth every morning.   tamsulosin 0.4  MG Caps capsule Commonly known as: FLOMAX TAKE 1 CAPSULE BY MOUTH EVERY DAY   triamcinolone cream 0.1 % Commonly known as: KENALOG triamcinolone acetonide 0.1 % topical cream   warfarin 5 MG tablet Commonly known as: COUMADIN Take 1 tablet (5 mg total) by mouth daily at 6 PM.        DISCHARGE INSTRUCTIONS:   Follow with primary care physician in 1 to 2 weeks. Follow-up with INR in 1 week. Follow with endocrine clinic in the next 3 to 4 days as advised.  If you experience worsening of your admission symptoms, develop shortness of breath, life threatening emergency, suicidal or homicidal thoughts you must seek medical attention immediately by calling 911 or calling your MD immediately  if symptoms less severe.  You Must read complete instructions/literature along with all the possible adverse reactions/side effects for all the Medicines  you take and that have been prescribed to you. Take any new Medicines after you have completely understood and accept all the possible adverse reactions/side effects.   Please note  You were cared for by a hospitalist during your hospital stay. If you have any questions about your discharge medications or the care you received while you were in the hospital after you are discharged, you can call the unit and asked to speak with the hospitalist on call if the hospitalist that took care of you is not available. Once you are discharged, your primary care physician will handle any further medical issues. Please note that NO REFILLS for any discharge medications will be authorized once you are discharged, as it is imperative that you return to your primary care physician (or establish a relationship with a primary care physician if you do not have one) for your aftercare needs so that they can reassess your need for medications and monitor your lab values.    Today   CHIEF COMPLAINT:   Chief Complaint  Patient presents with  . Dizziness    HISTORY OF PRESENT ILLNESS:  Isaac Cross  is a 58 y.o. male with a known history of arthritis, COPD not on home oxygen, hypertension, sleep apnea, non-insulin-dependent diabetes mellitus presents to hospital secondary to dizziness, weakness and a fall this morning. Patient had bilateral knee replacement surgeries done about 9 days ago.  He did well for the first week.  Over the last 3 to 4 days he has been having fevers and chills.  Initially had increased frequency of urination that has decreased the last 3 days.  His urine output has been decreased.  He has been feeling dizzy and lightheaded.  Has been nauseous and had one episode of vomiting.  Denies any abdominal pain or diarrhea.  Denies any exposure to sick contacts.  His COVID-19 test is negative here.  Denies any cough or respiratory symptoms or chest pain.  He states that he fell out of bed onto his knees  this morning and hurt both his knees.  After the surgery he has been getting home health physical therapy and was able to walk using his walker. In the ED his creatinine is increased from 1 at baseline to 3.7 today.  He received 4.5 L of fluids in the emergency room and his urine output after a Foley catheter was placed here was only 75 cc.  He is hyponatremic, acidotic.  Urine analysis showing possible infection.  Has lactic acidosis with elevated WBC and procalcitonin.  He is being admitted for sepsis.   VITAL SIGNS:  Blood pressure 120/83, pulse Marland Kitchen)  110, temperature 98.5 F (36.9 C), temperature source Oral, resp. rate 17, height 6\' 4"  (1.93 m), weight (!) 156 kg, SpO2 100 %.  I/O:    Intake/Output Summary (Last 24 hours) at 10/12/2018 1637 Last data filed at 10/12/2018 0946 Gross per 24 hour  Intake 240 ml  Output 4460 ml  Net -4220 ml    PHYSICAL EXAMINATION:  GENERAL:  58 y.o.-year-old patient lying in the bed with no acute distress.  EYES: Pupils equal, round, reactive to light and accommodation. No scleral icterus. Extraocular muscles intact.  HEENT: Head atraumatic, normocephalic. Oropharynx and nasopharynx clear.  NECK:  Supple, no jugular venous distention. No thyroid enlargement, no tenderness.  LUNGS: Normal breath sounds bilaterally, no wheezing, rales,rhonchi or crepitation. No use of accessory muscles of respiration.  CARDIOVASCULAR: S1, S2 normal. No murmurs, rubs, or gallops.  ABDOMEN: Soft, non-tender, non-distended. Bowel sounds present. No organomegaly or mass.  EXTREMITIES: No pedal edema, cyanosis, or clubbing.  NEUROLOGIC: Cranial nerves II through XII are intact. Muscle strength 5/5 in all extremities. Sensation intact. Gait not checked.  PSYCHIATRIC: The patient is alert and oriented x 3.  SKIN: No obvious rash, lesion, or ulcer.   DATA REVIEW:   CBC Recent Labs  Lab 10/12/18 0523  WBC 7.5  HGB 7.4*  HCT 23.9*  PLT 491*    Chemistries  Recent Labs   Lab 10/12/18 0523  NA 138  K 3.6  CL 105  CO2 23  GLUCOSE 134*  BUN 22*  CREATININE 1.71*  CALCIUM 8.6*    Cardiac Enzymes No results for input(s): TROPONINI in the last 168 hours.  Microbiology Results  Results for orders placed or performed during the hospital encounter of 09/12/18  Blood Culture (routine x 2)     Status: None   Collection Time: 09/12/18  9:56 AM   Specimen: BLOOD LEFT HAND  Result Value Ref Range Status   Specimen Description BLOOD LEFT HAND  Final   Special Requests   Final    BOTTLES DRAWN AEROBIC AND ANAEROBIC Blood Culture adequate volume   Culture   Final    NO GROWTH 5 DAYS Performed at [facility], [address] [place], [place] [address]    Report Status 09/17/2018 FINAL  Final  Urine culture     Status: None   Collection Time: 09/12/18 10:01 AM   Specimen: Urine, Random  Result Value Ref Range Status   Specimen Description   Final    URINE, RANDOM Performed at [facility], [address], [place], [place] [address]    Special Requests   Final    NONE Performed at [facility], [address], [place], [place] [address]    Culture   Final    NO GROWTH Performed at [facility], [place] [address] [place], [place] [address]    Report Status 09/13/2018 FINAL  Final  SARS Coronavirus 2 (CEPHEID- Performed in [place] hospital lab), Hosp Order     Status: None   Collection Time: 09/12/18 10:02 AM   Specimen: Nasopharyngeal Swab  Result Value Ref Range Status   SARS Coronavirus 2 NEGATIVE NEGATIVE Final    Comment: (NOTE) If result is NEGATIVE SARS-CoV-2 target nucleic acids are NOT DETECTED. The SARS-CoV-2 RNA is generally detectable in upper and lower  respiratory specimens during the acute phase of infection. The lowest  concentration of SARS-CoV-2 viral copies this assay can detect is 250  copies / mL. A negative result does not  preclude SARS-CoV-2 infection  and should not be used as the  sole basis for treatment or other  patient management decisions.  A negative result may occur with  improper specimen collection / handling, submission of specimen other  than nasopharyngeal swab, presence of viral mutation(s) within the  areas targeted by this assay, and inadequate number of viral copies  (<250 copies / mL). A negative result must be combined with clinical  observations, patient history, and epidemiological information. If result is POSITIVE SARS-CoV-2 target nucleic acids are DETECTED. The SARS-CoV-2 RNA is generally detectable in upper and lower  respiratory specimens dur ing the acute phase of infection.  Positive  results are indicative of active infection with SARS-CoV-2.  Clinical  correlation with patient history and other diagnostic information is  necessary to determine patient infection status.  Positive results do  not rule out bacterial infection or co-infection with other viruses. If result is PRESUMPTIVE POSTIVE SARS-CoV-2 nucleic acids MAY BE PRESENT.   A presumptive positive result was obtained on the submitted specimen  and confirmed on repeat testing.  While 2019 novel coronavirus  (SARS-CoV-2) nucleic acids may be present in the submitted sample  additional confirmatory testing may be necessary for epidemiological  and / or clinical management purposes  to differentiate between  SARS-CoV-2 and other Sarbecovirus currently known to infect humans.  If clinically indicated additional testing with an alternate test  methodology [phone]) is advised. The SARS-CoV-2 RNA is generally  detectable in upper and lower respiratory sp ecimens during the acute  phase of infection. The expected result is Negative. Fact Sheet for Patients:  StrictlyIdeas.no Fact Sheet for Healthcare Providers: BankingDealers.co.za This test is not yet approved or cleared by the [place] FDA and has been authorized for detection  and/or diagnosis of SARS-CoV-2 by FDA under an Emergency Use Authorization (EUA).  This EUA will remain in effect (meaning this test can be used) for the duration of the COVID-19 declaration under Section 564(b)(1) of the Act, 21 U.S.C. section 360bbb-3(b)(1), unless the authorization is terminated or revoked sooner. Performed at [facility], [address] [place], [place] [address]   Blood Culture (routine x 2)     Status: None   Collection Time: 09/12/18 10:06 AM   Specimen: BLOOD  Result Value Ref Range Status   Specimen Description BLOOD LEFT ANTECUBITAL  Final   Special Requests   Final    BOTTLES DRAWN AEROBIC AND ANAEROBIC Blood Culture adequate volume   Culture   Final    NO GROWTH 5 DAYS Performed at [facility], [address] [place], [place] [address]    Report Status 09/17/2018 FINAL  Final  MRSA PCR Screening     Status: None   Collection Time: 09/12/18  7:04 PM   Specimen: Nasal Mucosa; Nasopharyngeal  Result Value Ref Range Status   MRSA by PCR NEGATIVE NEGATIVE Final    Comment:        The GeneXpert MRSA Assay (FDA approved for NASAL specimens only), is one component of a comprehensive MRSA colonization surveillance program. It is not intended to diagnose MRSA infection nor to guide or monitor treatment for MRSA infections. Performed at [facility], [address] [place], [place] [address]   C difficile quick scan w PCR reflex     Status: None   Collection Time: 09/14/18  2:21 PM   Specimen: STOOL  Result Value Ref Range Status   C Diff antigen NEGATIVE NEGATIVE Final   C  Diff toxin NEGATIVE NEGATIVE Final   C Diff interpretation No C. difficile detected.  Final    Comment: Performed at [facility], [address] [place], [place] [address]  Group A Strep by PCR     Status: None   Collection Time: 09/15/18  1:53 PM  Result Value Ref Range Status   Group A Strep by PCR NOT DETECTED NOT DETECTED Final     Comment: Performed at [facility], [address] [place], [place] [address]  Body fluid culture     Status: None   Collection Time: 09/15/18  5:13 PM   Specimen: KNEE  Result Value Ref Range Status   Specimen Description   Final    KNEE Performed at [facility], [address], [place], [place] [address]    Special Requests   Final    NONE Performed at [facility], [address], [place], [place] [address]    Gram Stain   Final    RARE RBCS FEW WBC SEEN NO ORGANISMS SEEN Performed at [facility], [address] [place], [place] [address]    Culture   Final    NO GROWTH 3 DAYS Performed at [facility], [place] [address] [place], [place] [address]    Report Status 09/19/2018 FINAL  Final  Body fluid culture     Status: None   Collection Time: 09/16/18  8:55 AM   Specimen: KNEE  Result Value Ref Range Status   Specimen Description KNEE RIGHT  Final   Special Requests NONE  Final   Gram Stain   Final    RARE WBC PRESENT, PREDOMINANTLY PMN NO ORGANISMS SEEN    Culture   Final    NO GROWTH 3 DAYS Performed at [facility], [place] [address], [place], [place] [address]    Report Status 09/20/2018 FINAL  Final  Aerobic/Anaerobic Culture (surgical/deep wound)     Status: None   Collection Time: 09/17/18 11:32 AM   Specimen: Wound  Result Value Ref Range Status   Specimen Description   Final    TISSUE LEFT KNEE 1 Performed at [facility], [address] [place], [place] [address]    Special Requests PATIENT ON FOLLOWING MAXIPIME VANCOMYCIN  Final   Gram Stain   Final    FEW WBC PRESENT,BOTH PMN AND MONONUCLEAR NO ORGANISMS SEEN    Culture   Final    No growth aerobically or anaerobically. Performed at [facility], [place] [address] [place], [place] [address]    Report Status 09/22/2018 FINAL  Final  Acid Fast Smear (AFB)     Status: None   Collection Time: 09/17/18 11:32 AM   Specimen: Synovium   Result Value Ref Range Status   AFB Specimen Processing Comment  Final    Comment: Tissue Grinding and Digestion/Decontamination   Acid Fast Smear Negative  Final    Comment: (NOTE) Performed At: [facility] [place], [place] [id number] Rush Farmer MD [id number]    Source (AFB) TISSUE  Final    Comment: LEFT KNEE Performed at [facility], [place] [address], [place], [place] [address]   Culture, fungus without smear     Status: None   Collection Time: 09/17/18 11:34 AM   Specimen: Synovium; Other  Result Value Ref Range Status   Specimen Description   Final    SYNOVIAL Performed at [facility], [place], [place]  [address]    Special Requests   Final    NONE Performed at [facility], [address], [place], [place] [address]    Culture   Final    NO FUNGUS ISOLATED AFTER 21 DAYS Performed at [facility], [place] [address], [place], [place] [address]    Report Status 10/11/2018 FINAL  Final  Acid Fast Culture with reflexed sensitivities     Status: None   Collection Time: 09/17/18 11:36 AM   Specimen: Synovium  Result Value Ref Range Status   Acid Fast Culture CANC  Final    Comment: (NOTE) Test cancelled at client's request.      Cancelled per Levin Erp 09/21/2018 Performed At: [facility] [place], [place] [id number] Rush Farmer MD [id number]    Source of Sample TISSUE  Final    Comment: RIGHT KNEE Performed at [facility], [place] [address], [place], [place] [address]   Aerobic/Anaerobic Culture (surgical/deep wound)     Status: None   Collection Time: 09/17/18 11:43 AM   Specimen: Wound  Result Value Ref Range Status   Specimen Description   Final    TISSUE LEFT KNEE 2 Performed at [facility], [address] [place], [place] [address]    Special Requests PATIENT ON FOLLOWING MAXIPIME VANCOMYCIN  Final   Gram Stain   Final    FEW WBC  PRESENT, PREDOMINANTLY PMN NO ORGANISMS SEEN    Culture   Final    No growth aerobically or anaerobically. Performed at [facility], [place] [address] [place], [place] [address]    Report Status 09/22/2018 FINAL  Final  Aerobic/Anaerobic Culture (surgical/deep wound)     Status: None   Collection Time: 09/17/18 11:44 AM   Specimen: Wound  Result Value Ref Range Status   Specimen Description   Final    TISSUE LEFT KNEE 3 Performed at [facility], [address] [place], [place] [address]    Special Requests PATIENT ON FOLLOWING MAXIPIME VANCOMYCIN  Final   Gram Stain   Final    MODERATE WBC PRESENT, PREDOMINANTLY PMN NO ORGANISMS SEEN    Culture   Final    No growth aerobically or anaerobically. Performed at [facility], [place] [address] [place], [place] [address]    Report Status 09/22/2018 FINAL  Final  Aerobic/Anaerobic Culture (surgical/deep wound)     Status: None   Collection Time: 09/18/18  9:43 PM   Specimen: [facility] Other; Tissue  Result Value Ref Range Status   Specimen Description   Final    SYNOVIAL Performed at [facility], [address] [place], [place] [address]    Special Requests   Final    NONE Performed at [facility], [address] [place], [place] [address]    Gram Stain   Final    RARE WBC PRESENT, PREDOMINANTLY MONONUCLEAR NO ORGANISMS SEEN    Culture   Final    No growth aerobically or anaerobically. Performed at [facility], [place] [address], [place], [place] [address]    Report Status 09/24/2018 FINAL  Final  Aerobic/Anaerobic Culture (surgical/deep wound)     Status: None   Collection Time: 09/18/18  9:44 PM   Specimen: [facility] Other; Tissue  Result Value Ref Range Status   Specimen Description   Final    SYNOVIAL Performed at [facility], [address], [place], [place] [address]    Special  Requests   Final    RIGHT KNEE Performed at [facility], [address] [place], [place] [address]    Gram Stain   Final    FEW WBC PRESENT,BOTH PMN AND MONONUCLEAR NO ORGANISMS SEEN    Culture   Final    No growth aerobically or anaerobically. Performed at [facility], [place] [address], [place], [place] [address]    Report Status 09/24/2018 FINAL  Final  Aerobic/Anaerobic Culture (surgical/deep wound)     Status: None   Collection Time: 09/18/18  9:45 PM   Specimen: [facility] Other; Tissue  Result Value Ref Range Status   Specimen Description   Final    SYNOVIAL Performed at [facility], [address] [place], [place] [address]    Special Requests   Final    RIGHT KNEE Performed at [facility], [address] [place], [place] [address]    Gram Stain   Final    FEW WBC PRESENT, PREDOMINANTLY MONONUCLEAR NO ORGANISMS SEEN    Culture   Final    No growth aerobically or anaerobically. Performed at [facility], [place] [address], [place], [place] [address]    Report Status 09/24/2018 FINAL  Final  Culture, blood (Routine X 2) w Reflex to ID Panel     Status: None   Collection Time: 09/19/18  1:11 PM   Specimen: BLOOD  Result Value Ref Range Status   Specimen Description BLOOD LEFT ANTECUBITAL  Final   Special Requests   Final    BOTTLES DRAWN AEROBIC AND ANAEROBIC Blood Culture results may not be optimal due to an excessive volume of blood received in culture bottles   Culture   Final    NO GROWTH 5 DAYS Performed at [facility], [address] [place], [place] [address]    Report Status 09/24/2018 FINAL  Final  Culture, blood (Routine X 2) w Reflex to ID Panel     Status: None   Collection Time: 09/19/18  1:20 PM   Specimen: BLOOD  Result Value Ref Range Status   Specimen Description BLOOD BLOOD RIGHT HAND  Final   Special Requests   Final    BOTTLES DRAWN AEROBIC AND ANAEROBIC Blood Culture results may not be optimal due to an excessive volume of blood received in culture bottles   Culture   Final     NO GROWTH 5 DAYS Performed at [facility], [address], [place], [place] [address]    Report Status 09/24/2018 FINAL  Final  Urine Culture     Status: None   Collection Time: 09/20/18  3:59 PM   Specimen: Urine, Random  Result Value Ref Range Status   Specimen Description   Final    URINE, RANDOM Performed at [facility], [address], [place], [place] [address]    Special Requests   Final    NONE Performed at [facility], [address] [place], [place] [address]    Culture   Final    NO GROWTH Performed at [facility], [place] [address], [place],  [address]    Report Status 09/21/2018 FINAL  Final  C difficile quick scan w PCR reflex     Status: None   Collection Time: 09/21/18  3:27 PM   Specimen: STOOL  Result Value Ref Range Status   C Diff antigen NEGATIVE NEGATIVE Final   C Diff toxin NEGATIVE NEGATIVE Final   C Diff  interpretation No C. difficile detected.  Final    Comment: Performed at [facility], [address] [place], [place] [address]  Gastrointestinal Panel by PCR , Stool     Status: None   Collection Time: 09/21/18  3:27 PM   Specimen: Stool  Result Value Ref Range Status   Campylobacter species NOT DETECTED NOT DETECTED Final   Plesimonas shigelloides NOT DETECTED NOT DETECTED Final   Salmonella species NOT DETECTED NOT DETECTED Final   Yersinia enterocolitica NOT DETECTED NOT DETECTED Final   Vibrio species NOT DETECTED NOT DETECTED Final   Vibrio cholerae NOT DETECTED NOT DETECTED Final   Enteroaggregative E coli (EAEC) NOT DETECTED NOT DETECTED Final   Enteropathogenic E coli (EPEC) NOT DETECTED NOT DETECTED Final   Enterotoxigenic E coli (ETEC) NOT DETECTED NOT DETECTED Final   Shiga like toxin producing E coli (STEC) NOT DETECTED NOT DETECTED Final   Shigella/Enteroinvasive E coli (EIEC) NOT DETECTED NOT DETECTED Final   Cryptosporidium NOT DETECTED NOT DETECTED Final   Cyclospora  cayetanensis NOT DETECTED NOT DETECTED Final   Entamoeba histolytica NOT DETECTED NOT DETECTED Final   Giardia lamblia NOT DETECTED NOT DETECTED Final   Adenovirus F40/41 NOT DETECTED NOT DETECTED Final   Astrovirus NOT DETECTED NOT DETECTED Final   Norovirus GI/GII NOT DETECTED NOT DETECTED Final   Rotavirus A NOT DETECTED NOT DETECTED Final   Sapovirus (I, II, IV, and V) NOT DETECTED NOT DETECTED Final    Comment: Performed at [facility], [address] [place], [place] [address]  Novel Coronavirus, NAA (hospital order; send-out to ref lab)     Status: None   Collection Time: 09/29/18  4:36 PM   Specimen: Nasopharyngeal Swab; Respiratory  Result Value Ref Range Status   SARS-CoV-2, NAA NOT DETECTED NOT DETECTED Final    Comment: (NOTE) This test was developed and its performance characteristics determined by [employer]. This test has not been FDA cleared or approved. This test has been authorized by FDA under an Emergency Use Authorization (EUA). This test is only authorized for the duration of time the declaration that circumstances exist justifying the authorization of the emergency use of in vitro diagnostic tests for detection of SARS-CoV-2 virus and/or diagnosis of COVID-19 infection under section 564(b)(1) of the Act, 21 U.S.C. 235TDD-2(K)(0), unless the authorization is terminated or revoked sooner. When diagnostic testing is negative, the possibility of a false negative result should be considered in the context of a patient's recent exposures and the presence of clinical signs and symptoms consistent with COVID-19. An individual without symptoms of COVID-19 and who is not shedding SARS-CoV-2 virus would expect to have a negative (not detected) result in this assay. Performed  At: [facility] [address] [place], [place] [id number] Rush Farmer MD JS:[id number]    [place]  Final    Comment: Performed at  [facility], [address] [place], [place] [address]  CULTURE, BLOOD (ROUTINE X 2) w Reflex to ID Panel     Status: None   Collection Time: 10/02/18  5:26 PM   Specimen: BLOOD  Result Value Ref Range Status   Specimen Description BLOOD BLOOD RIGHT FOREARM  Final   Special Requests   Final    BOTTLES DRAWN AEROBIC AND ANAEROBIC Blood Culture adequate volume   Culture   Final    NO GROWTH 5 DAYS Performed at [facility], [address] [place], [place] [address]    Report Status 10/07/2018 FINAL  Final  CULTURE, BLOOD (ROUTINE X 2) w Reflex to ID Panel     Status: None   Collection Time: 10/02/18  5:41 PM   Specimen: BLOOD  Result Value Ref Range Status   Specimen Description BLOOD BLOOD RIGHT HAND  Final   Special Requests   Final    BOTTLES DRAWN AEROBIC AND ANAEROBIC Blood Culture adequate volume   Culture   Final    NO GROWTH 5 DAYS Performed at [facility], [address], [place], [place] [address]    Report Status 10/07/2018 FINAL  Final    RADIOLOGY:  No results found.  EKG:   Orders placed or performed during the hospital encounter of 09/12/18  . EKG 12-Lead  . EKG 12-Lead  . EKG 12-Lead  . EKG 12-Lead  . EKG 12-Lead  . EKG 12-Lead      Management plans discussed with the patient, family and they are in agreement.  CODE STATUS:     Code Status Orders  (From admission, onward)         Start     Ordered   09/12/18 1819  Full code  Continuous     09/12/18 1818        Code Status History    This patient has a current code status but no historical code status.   Advance Care Planning Activity    Advance Directive Documentation     Most Recent Value  Type of Advance Directive  Healthcare Power of Attorney  Pre-existing out of facility DNR order (yellow form or pink MOST form)  -  "MOST" Form in Place?  -      TOTAL TIME TAKING CARE OF THIS PATIENT: 35 minutes.    Vaughan Basta M.D on 10/12/2018 at  4:37 PM  Between 7am to 6pm - Pager - [phone]  After 6pm go to [email or web address] - password [email or web address] [facility]  Office  [phone]  CC: Primary care physician; Steele Sizer, MD   Note: This dictation was prepared with Dragon dictation along with smaller phrase technology. Any transcriptional errors that result from this process are unintentional.

## 2018-10-12 NOTE — Progress Notes (Signed)
Gave discharge info to patient's wife. She verbalized understanding. IV removed. Stable condition. RN and NT took patient out.

## 2018-10-12 NOTE — TOC Transition Note (Signed)
Transition of Care [facility]) - CM/SW Discharge Note   Patient Details  Name: Isaac Cross MRN: [id number] Date of Birth: 1960-12-31  Transition of Care [facility]) CM/SW Contact:  Isaac Cross, Isaac Cross Phone Number: [phone]  10/12/2018, 3:35 PM   Clinical Narrative: Per MD patient can D/C home today on PO ABX. Clinical Education officer, museum (CSW) notified Lauretta Chester home health representative that patient will D/C home today. Per patient's wife Isaac Cross patient has no DME needs. Per wife she will pick patient up today. RN aware of above. Please reconsult if future social work needs arise. CSW signing off.     Final next level of care: [place] Barriers to Discharge: Barriers Resolved   Patient Goals and CMS Choice Patient states their goals for this hospitalization and ongoing recovery are:: Improve mobility CMS Medicare.gov Compare Post Acute Care list provided to:: (Per patient's wife Isaac Cross patient had [facility] home health before coming [facility].) Choice offered to / list presented to : Spouse  Discharge Placement                       Discharge Plan and Services In-house Referral: Clinical Social Work Discharge Planning Services: CM Consult Post Acute Care Choice: Home Health                    HH Arranged: PT, RN, Nurse's Aide [place] Agency: [facility] Date [facility] Agency Contacted: 10/12/18 Time [place]: [place] Representative spoke with at [place]: [place]) Interventions     Readmission Risk Interventions No flowsheet data found.

## 2018-10-12 NOTE — Progress Notes (Signed)
[facility]  ROUNDING NOTE   Subjective:   Ambulated with physical therapist today Denies any acute complaints or shortness of breath 06/24 0701 - 06/25 0700 In: 840 [P.O.:840] Out: 6480 [Urine:6480]   Objective:  Vital signs in last 24 hours:  Temp:  [98.5 F (36.9 C)-99 F (37.2 C)] 98.5 F (36.9 C) (06/25 0747) Pulse Rate:  [93-102] 93 (06/25 0747) Resp:  [17-19] 17 (06/25 0747) BP: (138-148)/(89-95) 141/89 (06/25 0747) SpO2:  [97 %-100 %] 100 % (06/25 0747)  Weight change:  Filed Weights   09/29/18 0630 09/29/18 1445 09/29/18 1823  Weight: (!) 157.1 kg (!) 157 kg (!) 156 kg    Intake/Output: I/O last 3 completed shifts: In: 840 [P.O.:840] Out: 6980 [Urine:6980]   Intake/Output this shift:  Total I/O In: -  Out: 520 [Urine:520]  Physical Exam: General: NAD, laying in the bed  Head: Moist oral mucosal membranes  Eyes: Anicteric   Lungs:  Clear, room air  Heart: regular  Abdomen:  Soft, nontender, obese   Extremities: Trace to 1 + peripheral edema  Neurologic: Awake, alert, conversant  Skin: warm        Basic Metabolic Panel: Recent Labs  Lab 10/07/18 0428 10/08/18 0547 10/09/18 0407 10/11/18 0352 10/12/18 0523  NA 137 137 140 136 138  K 3.4* 3.4* 3.7 3.7 3.6  CL 104 105 107 103 105  CO2 19* 22 23 24 23   GLUCOSE 153* 159* 163* 169* 134*  BUN 28* 26* 24* 26* 22*  CREATININE 3.86* 2.85* 2.42* 1.93* 1.71*  CALCIUM 8.4* 8.3* 8.4* 8.2* 8.6*  PHOS  --   --   --  4.2  --     Liver Function Tests: Recent Labs  Lab 10/11/18 0352  ALBUMIN 2.3*   No results for input(s): LIPASE, AMYLASE in the last 168 hours. No results for input(s): AMMONIA in the last 168 hours.  CBC: Recent Labs  Lab 10/11/18 0352 10/12/18 0523  WBC 7.6 7.5  HGB 7.0* 7.4*  HCT 22.5* 23.9*  MCV 82.4 84.5  PLT 377 491*    Cardiac Enzymes: Recent Labs  Lab 10/08/18 0547 10/12/18 0523  CKTOTAL 86 485*    BNP: Invalid input(s):  POCBNP  CBG: Recent Labs  Lab 10/11/18 0820 10/11/18 1217 10/11/18 1704 10/11/18 2105 10/12/18 0748  GLUCAP 125* 112* 126* 152* 128*    Microbiology: Results for orders placed or performed during the hospital encounter of 09/12/18  Blood Culture (routine x 2)     Status: None   Collection Time: 09/12/18  9:56 AM   Specimen: BLOOD LEFT HAND  Result Value Ref Range Status   Specimen Description BLOOD LEFT HAND  Final   Special Requests   Final    BOTTLES DRAWN AEROBIC AND ANAEROBIC Blood Culture adequate volume   Culture   Final    NO GROWTH 5 DAYS Performed at [facility], [address] [place], [place] [address]    Report Status 09/17/2018 FINAL  Final  Urine culture     Status: None   Collection Time: 09/12/18 10:01 AM   Specimen: Urine, Random  Result Value Ref Range Status   Specimen Description   Final    URINE, RANDOM Performed at [facility], [address] [place], [place] [address]    Special Requests   Final    NONE Performed at [facility], [address] [place], [place] [address]    Culture   Final    NO  GROWTH Performed at [facility], [place] [address], [place], [place] [address]    Report Status 09/13/2018 FINAL  Final  SARS Coronavirus 2 (CEPHEID- Performed in [place] hospital lab), Hosp Order     Status: None   Collection Time: 09/12/18 10:02 AM   Specimen: Nasopharyngeal Swab  Result Value Ref Range Status   SARS Coronavirus 2 NEGATIVE NEGATIVE Final    Comment: (NOTE) If result is NEGATIVE SARS-CoV-2 target nucleic acids are NOT DETECTED. The SARS-CoV-2 RNA is generally detectable in upper and lower  respiratory specimens during the acute phase of infection. The lowest  concentration of SARS-CoV-2 viral copies this assay can detect is 250  copies / mL. A negative result does not preclude SARS-CoV-2 infection  and should not be used as the sole basis for treatment or other  patient management  decisions.  A negative result may occur with  improper specimen collection / handling, submission of specimen other  than nasopharyngeal swab, presence of viral mutation(s) within the  areas targeted by this assay, and inadequate number of viral copies  (<250 copies / mL). A negative result must be combined with clinical  observations, patient history, and epidemiological information. If result is POSITIVE SARS-CoV-2 target nucleic acids are DETECTED. The SARS-CoV-2 RNA is generally detectable in upper and lower  respiratory specimens dur ing the acute phase of infection.  Positive  results are indicative of active infection with SARS-CoV-2.  Clinical  correlation with patient history and other diagnostic information is  necessary to determine patient infection status.  Positive results do  not rule out bacterial infection or co-infection with other viruses. If result is PRESUMPTIVE POSTIVE SARS-CoV-2 nucleic acids MAY BE PRESENT.   A presumptive positive result was obtained on the submitted specimen  and confirmed on repeat testing.  While 2019 novel coronavirus  (SARS-CoV-2) nucleic acids may be present in the submitted sample  additional confirmatory testing may be necessary for epidemiological  and / or clinical management purposes  to differentiate between  SARS-CoV-2 and other Sarbecovirus currently known to infect humans.  If clinically indicated additional testing with an alternate test  methodology [phone] is advised. The SARS-CoV-2 RNA is generally  detectable in upper and lower respiratory sp ecimens during the acute  phase of infection. The expected result is Negative. Fact Sheet for Patients:  StrictlyIdeas.no Fact Sheet for Healthcare Providers: BankingDealers.co.za This test is not yet approved or cleared by the [place] FDA and has been authorized for detection and/or diagnosis of SARS-CoV-2 by FDA under an  Emergency Use Authorization (EUA).  This EUA will remain in effect (meaning this test can be used) for the duration of the COVID-19 declaration under Section 564(b)(1) of the Act, 21 U.S.C. section 360bbb-3(b)(1), unless the authorization is terminated or revoked sooner. Performed at [facility], [address] [place], [place] [address]   Blood Culture (routine x 2)     Status: None   Collection Time: 09/12/18 10:06 AM   Specimen: BLOOD  Result Value Ref Range Status   Specimen Description BLOOD LEFT ANTECUBITAL  Final   Special Requests   Final    BOTTLES DRAWN AEROBIC AND ANAEROBIC Blood Culture adequate volume   Culture   Final    NO GROWTH 5 DAYS Performed at [facility], [address], [place], [place] [address]    Report Status 09/17/2018 FINAL  Final  MRSA PCR Screening     Status: None   Collection Time: 09/12/18  7:04 PM   Specimen: Nasal Mucosa; Nasopharyngeal  Result Value Ref Range Status   MRSA by PCR NEGATIVE NEGATIVE Final    Comment:        The GeneXpert MRSA Assay (FDA approved for NASAL specimens only), is one component of a comprehensive MRSA colonization surveillance program. It is not intended to diagnose MRSA infection nor to guide or monitor treatment for MRSA infections. Performed at [facility], [address] [place], [place] [address]   C difficile quick scan w PCR reflex     Status: None   Collection Time: 09/14/18  2:21 PM   Specimen: STOOL  Result Value Ref Range Status   C Diff antigen NEGATIVE NEGATIVE Final   C Diff toxin NEGATIVE NEGATIVE Final   C Diff interpretation No C. difficile detected.  Final    Comment: Performed at [facility], [address] [place], [place] [address]  Group A Strep by PCR     Status: None   Collection Time: 09/15/18  1:53 PM  Result Value Ref Range Status   Group A Strep by PCR NOT DETECTED NOT DETECTED Final    Comment: Performed at [facility],  [address], [place], [place] [address]  Body fluid culture     Status: None   Collection Time: 09/15/18  5:13 PM   Specimen: KNEE  Result Value Ref Range Status   Specimen Description   Final    KNEE Performed at [facility], [address] [place], [place] [address]    Special Requests   Final    NONE Performed at [facility], [address], [place], [place] [address]    Gram Stain   Final    RARE RBCS FEW WBC SEEN NO ORGANISMS SEEN Performed at [facility], [address], [place], [place] [address]    Culture   Final    NO GROWTH 3 DAYS Performed at [facility], [place] [address] [place], [place] [address]    Report Status 09/19/2018 FINAL  Final  Body fluid culture     Status: None   Collection Time: 09/16/18  8:55 AM   Specimen: KNEE  Result Value Ref Range Status   Specimen Description KNEE RIGHT  Final   Special Requests NONE  Final   Gram Stain   Final    RARE WBC PRESENT, PREDOMINANTLY PMN NO ORGANISMS SEEN    Culture   Final    NO GROWTH 3 DAYS Performed at [facility], [place] [address], [place], [place] [address]    Report Status 09/20/2018 FINAL  Final  Aerobic/Anaerobic Culture (surgical/deep wound)     Status: None   Collection Time: 09/17/18 11:32 AM   Specimen: Wound  Result Value Ref Range Status   Specimen Description   Final    TISSUE LEFT KNEE 1 Performed at [facility], [address] [place], [place] [address]    Special Requests PATIENT ON FOLLOWING MAXIPIME VANCOMYCIN  Final   Gram Stain   Final    FEW WBC PRESENT,BOTH PMN AND MONONUCLEAR NO ORGANISMS SEEN    Culture   Final    No growth aerobically or anaerobically. Performed at [facility], [place] [address] [place], [place] [address]    Report Status 09/22/2018 FINAL  Final  Acid Fast Smear (AFB)     Status: None   Collection Time: 09/17/18 11:32 AM   Specimen: Synovium  Result Value Ref  Range Status   AFB  Specimen Processing Comment  Final    Comment: Tissue Grinding and Digestion/Decontamination   Acid Fast Smear Negative  Final    Comment: (NOTE) Performed At: [facility] [place], [place] [id number] Rush Farmer MD [id number]    Source (AFB) TISSUE  Final    Comment: LEFT KNEE Performed at [facility], [place] [address], [place], [place] [address]   Culture, fungus without smear     Status: None   Collection Time: 09/17/18 11:34 AM   Specimen: Synovium; Other  Result Value Ref Range Status   Specimen Description   Final    SYNOVIAL Performed at [facility], [address] [place], [place] [address]    Special Requests   Final    NONE Performed at [facility], [address], [place], [place] [address]    Culture   Final    NO FUNGUS ISOLATED AFTER 21 DAYS Performed at [facility], [place] [address], [place], [place] [address]    Report Status 10/11/2018 FINAL  Final  Acid Fast Culture with reflexed sensitivities     Status: None   Collection Time: 09/17/18 11:36 AM   Specimen: Synovium  Result Value Ref Range Status   Acid Fast Culture CANC  Final    Comment: (NOTE) Test cancelled at client's request.      Cancelled per Levin Erp 09/21/2018 Performed At: [facility] [place], [place] [id number] Rush Farmer MD [id number]    Source of Sample TISSUE  Final    Comment: RIGHT KNEE Performed at [facility], [place] [address] [place], [place] [address]   Aerobic/Anaerobic Culture (surgical/deep wound)     Status: None   Collection Time: 09/17/18 11:43 AM   Specimen: Wound  Result Value Ref Range Status   Specimen Description   Final    TISSUE LEFT KNEE 2 Performed at [facility], [address] [place], [place] [address]    Special Requests PATIENT ON FOLLOWING MAXIPIME VANCOMYCIN  Final   Gram Stain   Final    FEW WBC PRESENT, PREDOMINANTLY PMN NO ORGANISMS  SEEN    Culture   Final    No growth aerobically or anaerobically. Performed at [facility], [place] [address] [place], [place] [address]    Report Status 09/22/2018 FINAL  Final  Aerobic/Anaerobic Culture (surgical/deep wound)     Status: None   Collection Time: 09/17/18 11:44 AM   Specimen: Wound  Result Value Ref Range Status   Specimen Description   Final    TISSUE LEFT KNEE 3 Performed at [facility], [place] [address] [place], [place] [address]    Special Requests PATIENT ON FOLLOWING MAXIPIME VANCOMYCIN  Final   Gram Stain   Final    MODERATE WBC PRESENT, PREDOMINANTLY PMN NO ORGANISMS SEEN    Culture   Final    No growth aerobically or anaerobically. Performed at [facility], [place] [address], [place], [place] [address]    Report Status 09/22/2018 FINAL  Final  Aerobic/Anaerobic Culture (surgical/deep wound)     Status: None   Collection Time: 09/18/18  9:43 PM   Specimen: [facility] Other; Tissue  Result Value Ref Range Status   Specimen Description   Final    SYNOVIAL Performed at [facility], [address] [place],  [address]    Special Requests   Final    NONE  Performed at [facility], [address] [place], [place] [address]    Gram Stain   Final    RARE WBC PRESENT, PREDOMINANTLY MONONUCLEAR NO ORGANISMS SEEN    Culture   Final    No growth aerobically or anaerobically. Performed at [facility], [place] [address], [place], [place] [address]    Report Status 09/24/2018 FINAL  Final  Aerobic/Anaerobic Culture (surgical/deep wound)     Status: None   Collection Time: 09/18/18  9:44 PM   Specimen: [facility] Other; Tissue  Result Value Ref Range Status   Specimen Description   Final    SYNOVIAL Performed at [facility], [address] [place], [place] [address]    Special Requests   Final    RIGHT KNEE Performed at [facility], [place], [place] [address]    Gram Stain    Final    FEW WBC PRESENT,BOTH PMN AND MONONUCLEAR NO ORGANISMS SEEN    Culture   Final    No growth aerobically or anaerobically. Performed at [facility], [place] [address] [place], [place] [address]    Report Status 09/24/2018 FINAL  Final  Aerobic/Anaerobic Culture (surgical/deep wound)     Status: None   Collection Time: 09/18/18  9:45 PM   Specimen: [facility] Other; Tissue  Result Value Ref Range Status   Specimen Description   Final    SYNOVIAL Performed at [facility], [address] [place], [place] [address]    Special Requests   Final    RIGHT KNEE Performed at [facility], [address] [place], [place] [address]    Gram Stain   Final    FEW WBC PRESENT, PREDOMINANTLY MONONUCLEAR NO ORGANISMS SEEN    Culture   Final    No growth aerobically or anaerobically. Performed at [facility], [place] [address], [place], [place] [address]    Report Status 09/24/2018 FINAL  Final  Culture, blood (Routine X 2) w Reflex to ID Panel     Status: None   Collection Time: 09/19/18  1:11 PM   Specimen: BLOOD  Result Value Ref Range Status   Specimen Description BLOOD LEFT ANTECUBITAL  Final   Special Requests   Final    BOTTLES DRAWN AEROBIC AND ANAEROBIC Blood Culture results may not be optimal due to an excessive volume of blood received in culture bottles   Culture   Final    NO GROWTH 5 DAYS Performed at [facility], [address] [place], [place] [address]    Report Status 09/24/2018 FINAL  Final  Culture, blood (Routine X 2) w Reflex to ID Panel     Status: None   Collection Time: 09/19/18  1:20 PM   Specimen: BLOOD  Result Value Ref Range Status   Specimen Description BLOOD BLOOD RIGHT HAND  Final   Special Requests   Final    BOTTLES DRAWN AEROBIC AND ANAEROBIC Blood Culture results may not be optimal due to an excessive volume of blood received in culture bottles   Culture   Final    NO GROWTH 5 DAYS Performed at [facility], [address], [place], [place] [address]    Report Status 09/24/2018 FINAL  Final  Urine Culture     Status: None   Collection Time: 09/20/18  3:59 PM   Specimen: Urine, Random  Result Value Ref Range Status   Specimen Description   Final  URINE, RANDOM Performed at [facility], [address], [place], [place] [address]    Special Requests   Final    NONE Performed at [facility], [address] [place], [place] [address]    Culture   Final    NO GROWTH Performed at [facility], [place] [address], [place], [place] [address]    Report Status 09/21/2018 FINAL  Final  C difficile quick scan w PCR reflex     Status: None   Collection Time: 09/21/18  3:27 PM   Specimen: STOOL  Result Value Ref Range Status   C Diff antigen NEGATIVE NEGATIVE Final   C Diff toxin NEGATIVE NEGATIVE Final   C Diff interpretation No C. difficile detected.  Final    Comment: Performed at [facility], [address] [place], [place] [address]  Gastrointestinal Panel by PCR , Stool     Status: None   Collection Time: 09/21/18  3:27 PM   Specimen: Stool  Result Value Ref Range Status   Campylobacter species NOT DETECTED NOT DETECTED Final   Plesimonas shigelloides NOT DETECTED NOT DETECTED Final   Salmonella species NOT DETECTED NOT DETECTED Final   Yersinia enterocolitica NOT DETECTED NOT DETECTED Final   Vibrio species NOT DETECTED NOT DETECTED Final   Vibrio cholerae NOT DETECTED NOT DETECTED Final   Enteroaggregative E coli (EAEC) NOT DETECTED NOT DETECTED Final   Enteropathogenic E coli (EPEC) NOT DETECTED NOT DETECTED Final   Enterotoxigenic E coli (ETEC) NOT DETECTED NOT DETECTED Final   Shiga like toxin producing E coli (STEC) NOT DETECTED NOT DETECTED Final   Shigella/Enteroinvasive E coli (EIEC) NOT DETECTED NOT DETECTED Final   Cryptosporidium NOT DETECTED NOT DETECTED Final   Cyclospora cayetanensis NOT DETECTED NOT DETECTED Final    Entamoeba histolytica NOT DETECTED NOT DETECTED Final   Giardia lamblia NOT DETECTED NOT DETECTED Final   Adenovirus F40/41 NOT DETECTED NOT DETECTED Final   Astrovirus NOT DETECTED NOT DETECTED Final   Norovirus GI/GII NOT DETECTED NOT DETECTED Final   Rotavirus A NOT DETECTED NOT DETECTED Final   Sapovirus (I, II, IV, and V) NOT DETECTED NOT DETECTED Final    Comment: Performed at [facility], [address] [place], [place] [address]  Novel Coronavirus, NAA (hospital order; send-out to ref lab)     Status: None   Collection Time: 09/29/18  4:36 PM   Specimen: Nasopharyngeal Swab; Respiratory  Result Value Ref Range Status   SARS-CoV-2, NAA NOT DETECTED NOT DETECTED Final    Comment: (NOTE) This test was developed and its performance characteristics determined by [employer]. This test has not been FDA cleared or approved. This test has been authorized by FDA under an Emergency Use Authorization (EUA). This test is only authorized for the duration of time the declaration that circumstances exist justifying the authorization of the emergency use of in vitro diagnostic tests for detection of SARS-CoV-2 virus and/or diagnosis of COVID-19 infection under section 564(b)(1) of the Act, 21 U.S.C. 676PPJ-0(D)(3), unless the authorization is terminated or revoked sooner. When diagnostic testing is negative, the possibility of a false negative result should be considered in the context of a patient's recent exposures and the presence of clinical signs and symptoms consistent with COVID-19. An individual without symptoms of COVID-19 and who is not shedding SARS-CoV-2 virus would expect to have a negative (not detected) result in this assay. Performed  At: [facility] [address] [place], [place] [id number] Perlie Gold  Derinda Late MD [id number]    Coronavirus Source NASOPHARYNGEAL  Final    Comment: Performed at [facility], [address]  [place], [place] [address]  CULTURE, BLOOD (ROUTINE X 2) w Reflex to ID Panel     Status: None   Collection Time: 10/02/18  5:26 PM   Specimen: BLOOD  Result Value Ref Range Status   Specimen Description BLOOD BLOOD RIGHT FOREARM  Final   Special Requests   Final    BOTTLES DRAWN AEROBIC AND ANAEROBIC Blood Culture adequate volume   Culture   Final    NO GROWTH 5 DAYS Performed at [facility], [address] [place], [place] [address]    Report Status 10/07/2018 FINAL  Final  CULTURE, BLOOD (ROUTINE X 2) w Reflex to ID Panel     Status: None   Collection Time: 10/02/18  5:41 PM   Specimen: BLOOD  Result Value Ref Range Status   Specimen Description BLOOD BLOOD RIGHT HAND  Final   Special Requests   Final    BOTTLES DRAWN AEROBIC AND ANAEROBIC Blood Culture adequate volume   Culture   Final    NO GROWTH 5 DAYS Performed at [facility], [address] [place], [place] [address]    Report Status 10/07/2018 FINAL  Final    Coagulation Studies: Recent Labs    10/10/18 0549 10/11/18 0352 10/12/18 0523  LABPROT 22.4* 20.8* 20.4*  INR 2.0* 1.8* 1.8*    Urinalysis: No results for input(s): COLORURINE, LABSPEC, PHURINE, GLUCOSEU, HGBUR, BILIRUBINUR, KETONESUR, PROTEINUR, UROBILINOGEN, NITRITE, LEUKOCYTESUR in the last 72 hours.  Invalid input(s): APPERANCEUR    Imaging: No results found.   Medications:   . anticoagulant sodium citrate     . acidophilus  1 capsule Oral Daily  . aspirin EC  81 mg Oral Daily  . Chlorhexidine Gluconate Cloth  6 each Topical Q0600  . citalopram  10 mg Oral Daily  . doxycycline  100 mg Oral Q12H  . epoetin (EPOGEN/PROCRIT) injection  20,000 Units Intravenous Weekly  . feeding supplement (ENSURE ENLIVE)  237 mL Oral BID BM  . fludrocortisone  0.1 mg Oral BID  . fluticasone furoate-vilanterol  1 puff Inhalation Daily  . folic acid  1 mg Oral Daily  . hydrocortisone  20 mg Oral BID  . insulin aspart  0-5 Units Subcutaneous  QHS  . insulin aspart  0-9 Units Subcutaneous TID WC  . levofloxacin  500 mg Oral Daily  . multivitamin with minerals  1 tablet Oral Daily  . omega-3 acid ethyl esters  1 g Oral Daily  . warfarin  7.5 mg Oral ONCE-1800  . Warfarin - Pharmacist Dosing Inpatient   Does not apply q1800   acetaminophen **OR** acetaminophen, alum & mag hydroxide-simeth, anticoagulant sodium citrate, HYDROcodone-acetaminophen, iohexol, ipratropium-albuterol, menthol-cetylpyridinium **OR** phenol, ondansetron **OR** ondansetron (ZOFRAN) IV  Assessment/ Plan:  [name] is a 58 y.o. black male with hyeprtension, COPD, diabetes mellitus type II who underwent bilateral knee replacement surgeries on 5/15 at [facility]. who was admitted to [facility] on 09/12/2018 for septic arthritis. Hospital course with left knee surgery on 5/31 and right knee surgery on 6/1 by Dr. Harlow Mares.   1. Acute renal failure  baseline creatinine of 1.07 with normal GFR in 06/12/18.  Acute renal failure secondary to ATN. Required hemodialysis. First treatment was 6/9. Last hemodialysis treatment 6/12.  Continues to have improving renal function and nonoliguric urine output.  Dialysis access has been removed. No indication  for further dialysis.  Monitor lab results closely Creatinine is down to 1.71 We will follow-up as outpatient  2. Hypotension with concern of adrenal insufficiency   BP acceptable MRI with 5.1 cm left adrenal mass, 3.8 cm right adrenal mass.  Nonspecific characteristics.  Concern about adrenal hemorrhage - Will need  Further workup as an outpatient - fludrocortisone and hydrocortisone   3. Anemia with renal failure: Lab Results  Component Value Date   HGB 7.4 (L) 10/12/2018  procrit weekly     LOS: 30 Isaac Cross 6/25/20201:28 PM

## 2018-10-12 NOTE — Progress Notes (Signed)
Physical Therapy Treatment Patient Details Name: Isaac Cross MRN: [id number] DOB: Aug 10, 1960 Today's Date: 10/12/2018    History of Present Illness Pt is a 58 y.o. male presenting to hospital 09/12/18 after rolling OOB landing on L knee; pt with increased dizziness x2 days, fevers x3 days, and generalized weakness.  S/p B TKR 09/01/18 in [place] (pt reports discharging home same day).  Pt admitted with sepsis, acute renal failure, and B TKR.  Pt s/p L knee 5/31 and R knee 6/1 I&D with poly exchange.  Pt noted with severe encephalopathy from DT's with extensive ETOH abuse.  L LE DVT noted.  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  HD started 6/5 with L IJ temporary HD catheter.  PMH includes B TKR 09/01/18 in [place], DM, asthma, R vein surgery, COPD. Right IJ line removed 6/16. 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    PT Comments    Pt more alert today and open to education.  He was able to perform bed mobility mod I and ambulate 200 ft with RW and chair follow.  Pt reported no pain in knees and was able to demonstrate understanding of there ex.  PT noted gait deviations indicative of fall risk and pt was open to education, responding well to VC's.  Pt will continue to benefit from skilled PT with focus on strength, safe functional mobility and balance.  Discharge plan has been updated to home with [facility] PT, pt will need supervision intermittently when OOB.   Follow Up Recommendations  Home health PT;Supervision for mobility/OOB     Equipment Recommendations  Rolling walker with 5" wheels;3in1 (PT)    Recommendations for Other Services       Precautions / Restrictions Precautions Precautions: Fall Restrictions Weight Bearing Restrictions: Yes RLE Weight Bearing: Weight bearing as tolerated LLE Weight Bearing: Weight bearing as tolerated    Mobility  Bed Mobility Overal bed mobility: Modified Independent Bed Mobility: Supine to Sit Rolling: Modified  independent (Device/Increase time)   Supine to sit: Modified independent (Device/Increase time)     General bed mobility comments: Able to come to bedside without physical assistance, slowly and with use of bedrail.  Transfers Overall transfer level: Needs assistance Equipment used: Rolling walker (2 wheeled) Transfers: Sit to/from Stand Sit to Stand: Min guard;+2 safety/equipment         General transfer comment: Two person assistance for safety provided.  Pt able to stand from bedside without physical assistance.  VC's to place hands on bed when standing.  Ambulation/Gait Ambulation/Gait assistance: Min guard Gait Distance (Feet): 200 Feet Assistive device: Rolling walker (2 wheeled)     Gait velocity interpretation: 1.31 - 2.62 ft/sec, indicative of limited community ambulator General Gait Details: Steady gait today, noted frequent crossover gait with in toeing.  PT discussed hip strengthening exercises and pt was open.  PT tech followed with chair for safety.  No rest breaks needed.   Stairs             Wheelchair Mobility    Modified Rankin (Stroke Patients Only)       Balance Overall balance assessment: Needs assistance Sitting-balance support: No upper extremity supported;Feet supported Sitting balance-Leahy Scale: Good     Standing balance support: Bilateral upper extremity supported Standing balance-Leahy Scale: Fair                              Cognition Arousal/Alertness: Awake/alert Behavior  During Therapy: WFL for tasks assessed/performed Overall Cognitive Status: Within Functional Limits for tasks assessed                                 General Comments: Pt better able to follow directions and conversate today.  No delay in processing commands.      Exercises Other Exercises Other Exercises: Reviewed ther ex and discussed importance of HEP performance frequently to return to PLOF.    General Comments General  comments (skin integrity, edema, etc.): Pt asymptomatic for hypotension today.  MD noted that BP has been stabilized prior to treatment.      Pertinent Vitals/Pain Pain Assessment: No/denies pain    Home Living                      Prior Function            PT Goals (current goals can now be found in the care plan section) Acute Rehab PT Goals Patient Stated Goal: To go home and to return to the gym eventually. PT Goal Formulation: With patient Time For Goal Achievement: 10/26/18 Potential to Achieve Goals: Good Progress towards PT goals: Progressing toward goals    Frequency    7X/week      PT Plan Discharge plan needs to be updated    Co-evaluation              AM-PAC PT "6 Clicks" Mobility   Outcome Measure  Help needed turning from your back to your side while in a flat bed without using bedrails?: None Help needed moving from lying on your back to sitting on the side of a flat bed without using bedrails?: None Help needed moving to and from a bed to a chair (including a wheelchair)?: A Little Help needed standing up from a chair using your arms (e.g., wheelchair or bedside chair)?: A Little Help needed to walk in hospital room?: A Little Help needed climbing 3-5 steps with a railing? : A Little 6 Click Score: 20    End of Session Equipment Utilized During Treatment: Gait belt Activity Tolerance: Patient tolerated treatment well(limited by symptomatic orthostatsis) Patient left: in chair;with call bell/phone within reach Nurse Communication: Mobility status PT Visit Diagnosis: Other abnormalities of gait and mobility (R26.89);Muscle weakness (generalized) (M62.81);Difficulty in walking, not elsewhere classified (R26.2) Pain - Right/Left: Right Pain - part of body: Knee     Time: 1100-1125 PT Time Calculation (min) (ACUTE ONLY): 25 min  Charges:  $Gait Training: 8-22 mins $Therapeutic Activity: 8-22 mins                     Roxanne Gates,  PT, DPT    Roxanne Gates 10/12/2018, 12:18 PM

## 2018-10-12 NOTE — Progress Notes (Signed)
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  L DVT Argatroban drip discontinued 10/03/2018  Allergies  Allergen Reactions  . 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 Lipitor [Atorvastatin] Hives and Itching  . Other   . Viagra  [Sildenafil Citrate]     vision loss    Patient Measurements: Height: 6\' 4"  (193 cm) Weight: (!) 343 lb 14.7 oz (156 kg) IBW/kg (Calculated) : 86.8  Vital Signs: Temp: 98.5 F (36.9 C) (06/25 0747) Temp Source: Oral (06/25 0747) BP: 141/89 (06/25 0747) Pulse Rate: 93 (06/25 0747)  Labs: Recent Labs    10/10/18 0549 10/11/18 0352 10/12/18 0523  HGB  --  7.0* 7.4*  HCT  --  22.5* 23.9*  PLT  --  377 491*  LABPROT 22.4* 20.8* 20.4*  INR 2.0* 1.8* 1.8*  CREATININE  --  1.93* 1.71*  CKTOTAL  --   --  485*    Estimated Creatinine Clearance: 74.4 mL/min (A) (by C-G formula based on SCr of 1.71 mg/dL (H)).   Assessment: 58 yr male had B/l knee replacements Sep 01, 2018 was admitted with fever, AKI/ Platelet count has been declining since 6/5. He was started on heparin 6/3. 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 No dose given 6/22 INR 2.3 6 mg 6/23 INR 2.0 6 mg 6/24 INR 1.8 7.5 mg  Goal of Therapy:  Monitor platelets by anticoagulation protocol: Yes INR: 2-3   Plan:  INR trending down and subtherapeutic at 1.8 again today, likely from missed dose a few days ago. Will order warfarin 7.5 mg x1 again tonight. Will obtain INR daily. Will need CBC at least every three days per policy.   Patient on levofloxacin (started 6/17) and doxycycline  Pharmacy will continue to monitor and adjust per consult.   Chinita Greenland  PharmD Clinical Pharmacist 10/12/2018

## 2018-10-13 ENCOUNTER — Telehealth: Payer: Self-pay | Admitting: Family Medicine

## 2018-10-13 DIAGNOSIS — R000 Tachycardia, unspecified: Secondary | ICD-10-CM

## 2018-10-13 LAB — FUNGUS CULTURE, BLOOD
Culture: NO GROWTH
Special Requests: ADEQUATE

## 2018-10-13 NOTE — Telephone Encounter (Signed)
Missy with [facility] is calling because the patient is on Coumadin. Wanting to know is the patient scheduled for an INR check or does Dr. Ancil Boozer want her to check it?  Verbal orders to monitor patient for a couple of weeks. New to Coumadin.  Please advise Cb- [phone]

## 2018-10-13 NOTE — Telephone Encounter (Signed)
Per Dr. Ancil Boozer gave verbal orders for Missy from [facility] to check patient PT/INR Monday morning and report his results by phone.

## 2018-10-16 ENCOUNTER — Other Ambulatory Visit: Payer: Self-pay | Admitting: Family Medicine

## 2018-10-16 DIAGNOSIS — R350 Frequency of micturition: Secondary | ICD-10-CM

## 2018-10-16 MED ORDER — TAMSULOSIN HCL 0.4 MG PO CAPS: 0.4000 mg | ORAL_CAPSULE | Freq: Every day | ORAL | 0 refills | Status: DC

## 2018-10-16 NOTE — Telephone Encounter (Signed)
Missy with [facility] calling to notify Dr. Ancil Boozer that patient heart rate was 103 on Friday 06/26 and  115 today.

## 2018-10-18 ENCOUNTER — Telehealth: Payer: Self-pay

## 2018-10-18 NOTE — Telephone Encounter (Signed)
Missy from [facility] home health called with patient Abnormal INR results his result was 1.7. Patient is currently taking Coumadin 5 mg daily. Please advise.

## 2018-10-19 ENCOUNTER — Encounter: Payer: Self-pay | Admitting: Family Medicine

## 2018-10-19 NOTE — Telephone Encounter (Signed)
Patient notified to take 1 of his Coumadin daily and then 1 and a half twice a week and Missy will recheck his INR in 1 week. Patient states he will be in the office Monday to see Dr. Ancil Boozer as well.

## 2018-10-20 ENCOUNTER — Telehealth: Payer: Self-pay | Admitting: Family Medicine

## 2018-10-20 ENCOUNTER — Ambulatory Visit: Payer: Self-pay | Admitting: Family Medicine

## 2018-10-20 ENCOUNTER — Emergency Department
Admission: EM | Admit: 2018-10-20 | Discharge: 2018-10-20 | Discharge status: Against Medical Advice | Payer: BC Managed Care – PPO | Attending: Emergency Medicine | Admitting: Emergency Medicine

## 2018-10-20 ENCOUNTER — Other Ambulatory Visit: Payer: Self-pay

## 2018-10-20 ENCOUNTER — Encounter: Payer: Self-pay | Admitting: Emergency Medicine

## 2018-10-20 ENCOUNTER — Emergency Department: Payer: BC Managed Care – PPO

## 2018-10-20 ENCOUNTER — Encounter: Payer: Self-pay | Admitting: Family Medicine

## 2018-10-20 DIAGNOSIS — L03032 Cellulitis of left toe: Secondary | ICD-10-CM | POA: Diagnosis not present

## 2018-10-20 DIAGNOSIS — J449 Chronic obstructive pulmonary disease, unspecified: Secondary | ICD-10-CM | POA: Diagnosis not present

## 2018-10-20 DIAGNOSIS — A419 Sepsis, unspecified organism: Secondary | ICD-10-CM | POA: Insufficient documentation

## 2018-10-20 DIAGNOSIS — E119 Type 2 diabetes mellitus without complications: Secondary | ICD-10-CM | POA: Insufficient documentation

## 2018-10-20 DIAGNOSIS — Z96653 Presence of artificial knee joint, bilateral: Secondary | ICD-10-CM | POA: Insufficient documentation

## 2018-10-20 DIAGNOSIS — Z7982 Long term (current) use of aspirin: Secondary | ICD-10-CM | POA: Diagnosis not present

## 2018-10-20 DIAGNOSIS — Z87891 Personal history of nicotine dependence: Secondary | ICD-10-CM | POA: Insufficient documentation

## 2018-10-20 DIAGNOSIS — M79674 Pain in right toe(s): Secondary | ICD-10-CM | POA: Diagnosis present

## 2018-10-20 DIAGNOSIS — I10 Essential (primary) hypertension: Secondary | ICD-10-CM | POA: Insufficient documentation

## 2018-10-20 DIAGNOSIS — Z5320 Procedure and treatment not carried out because of patient's decision for unspecified reasons: Secondary | ICD-10-CM | POA: Insufficient documentation

## 2018-10-20 DIAGNOSIS — Z7901 Long term (current) use of anticoagulants: Secondary | ICD-10-CM | POA: Diagnosis not present

## 2018-10-20 DIAGNOSIS — Z79899 Other long term (current) drug therapy: Secondary | ICD-10-CM | POA: Diagnosis not present

## 2018-10-20 DIAGNOSIS — J45909 Unspecified asthma, uncomplicated: Secondary | ICD-10-CM | POA: Insufficient documentation

## 2018-10-20 LAB — CBC WITH DIFFERENTIAL/PLATELET
Abs Immature Granulocytes: 0.04 10*3/uL (ref 0.00–0.07)
Basophils Absolute: 0.1 10*3/uL (ref 0.0–0.1)
Basophils Relative: 1 %
Eosinophils Absolute: 0.1 10*3/uL (ref 0.0–0.5)
Eosinophils Relative: 1 %
HCT: 23.8 % — ABNORMAL LOW (ref 39.0–52.0)
Hemoglobin: 7.6 g/dL — ABNORMAL LOW (ref 13.0–17.0)
Immature Granulocytes: 0 %
Lymphocytes Relative: 19 %
Lymphs Abs: 1.8 10*3/uL (ref 0.7–4.0)
MCH: 25.4 pg — ABNORMAL LOW (ref 26.0–34.0)
MCHC: 31.9 g/dL (ref 30.0–36.0)
MCV: 79.6 fL — ABNORMAL LOW (ref 80.0–100.0)
Monocytes Absolute: 0.8 10*3/uL (ref 0.1–1.0)
Monocytes Relative: 9 %
Neutro Abs: 6.6 10*3/uL (ref 1.7–7.7)
Neutrophils Relative %: 70 %
Platelets: 535 10*3/uL — ABNORMAL HIGH (ref 150–400)
RBC: 2.99 MIL/uL — ABNORMAL LOW (ref 4.22–5.81)
RDW: 17 % — ABNORMAL HIGH (ref 11.5–15.5)
WBC: 9.5 10*3/uL (ref 4.0–10.5)
nRBC: 0 % (ref 0.0–0.2)

## 2018-10-20 LAB — COMPREHENSIVE METABOLIC PANEL
ALT: 18 U/L (ref 0–44)
AST: 28 U/L (ref 15–41)
Albumin: 2.8 g/dL — ABNORMAL LOW (ref 3.5–5.0)
Alkaline Phosphatase: 45 U/L (ref 38–126)
Anion gap: 12 (ref 5–15)
BUN: 15 mg/dL (ref 6–20)
CO2: 19 mmol/L — ABNORMAL LOW (ref 22–32)
Calcium: 8.8 mg/dL — ABNORMAL LOW (ref 8.9–10.3)
Chloride: 106 mmol/L (ref 98–111)
Creatinine, Ser: 1.13 mg/dL (ref 0.61–1.24)
GFR calc Af Amer: >60 mL/min (ref >60)
GFR calc non Af Amer: >60 mL/min (ref >60)
Glucose, Bld: 125 mg/dL — ABNORMAL HIGH (ref 70–99)
Potassium: 3.8 mmol/L (ref 3.5–5.1)
Sodium: 137 mmol/L (ref 135–145)
Total Bilirubin: 0.3 mg/dL (ref 0.3–1.2)
Total Protein: 7.4 g/dL (ref 6.5–8.1)

## 2018-10-20 LAB — PROTIME-INR
INR: 1.5 — ABNORMAL HIGH (ref 0.8–1.2)
Prothrombin Time: 18.1 seconds — ABNORMAL HIGH (ref 11.4–15.2)

## 2018-10-20 LAB — LACTIC ACID, PLASMA: Lactic Acid, Venous: 1.6 mmol/L (ref 0.5–1.9)

## 2018-10-20 IMAGING — DX LEFT FOOT - COMPLETE 3+ VIEW
3 series · 3 of 3 positions shown · non-contrast
Comparison: None.

CLINICAL DATA: Left foot edema for 1 day. History of diabetes. No
prior injury or surgery.

EXAM:
LEFT FOOT - COMPLETE 3+ VIEW

[foot ap]
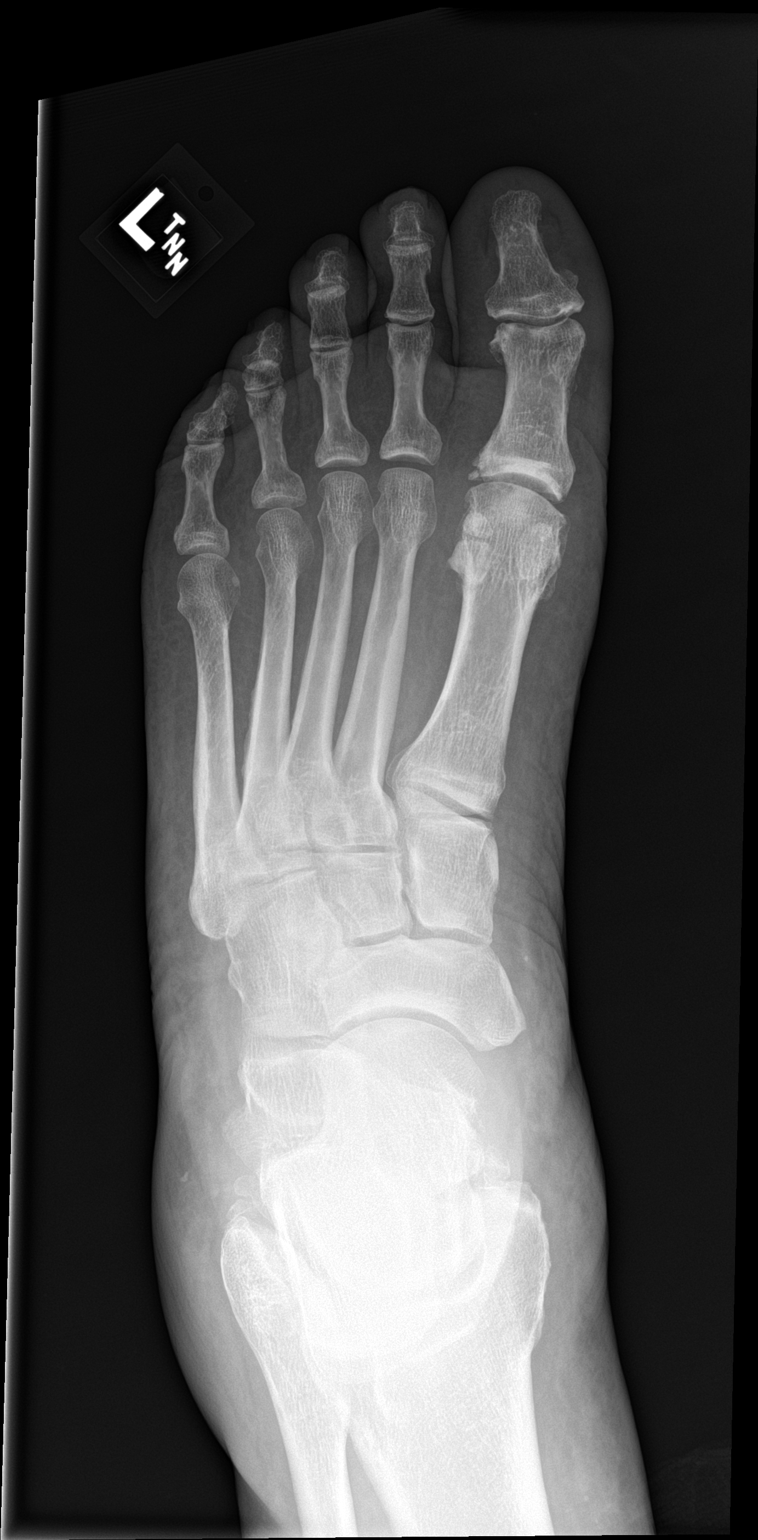

[foot obl]
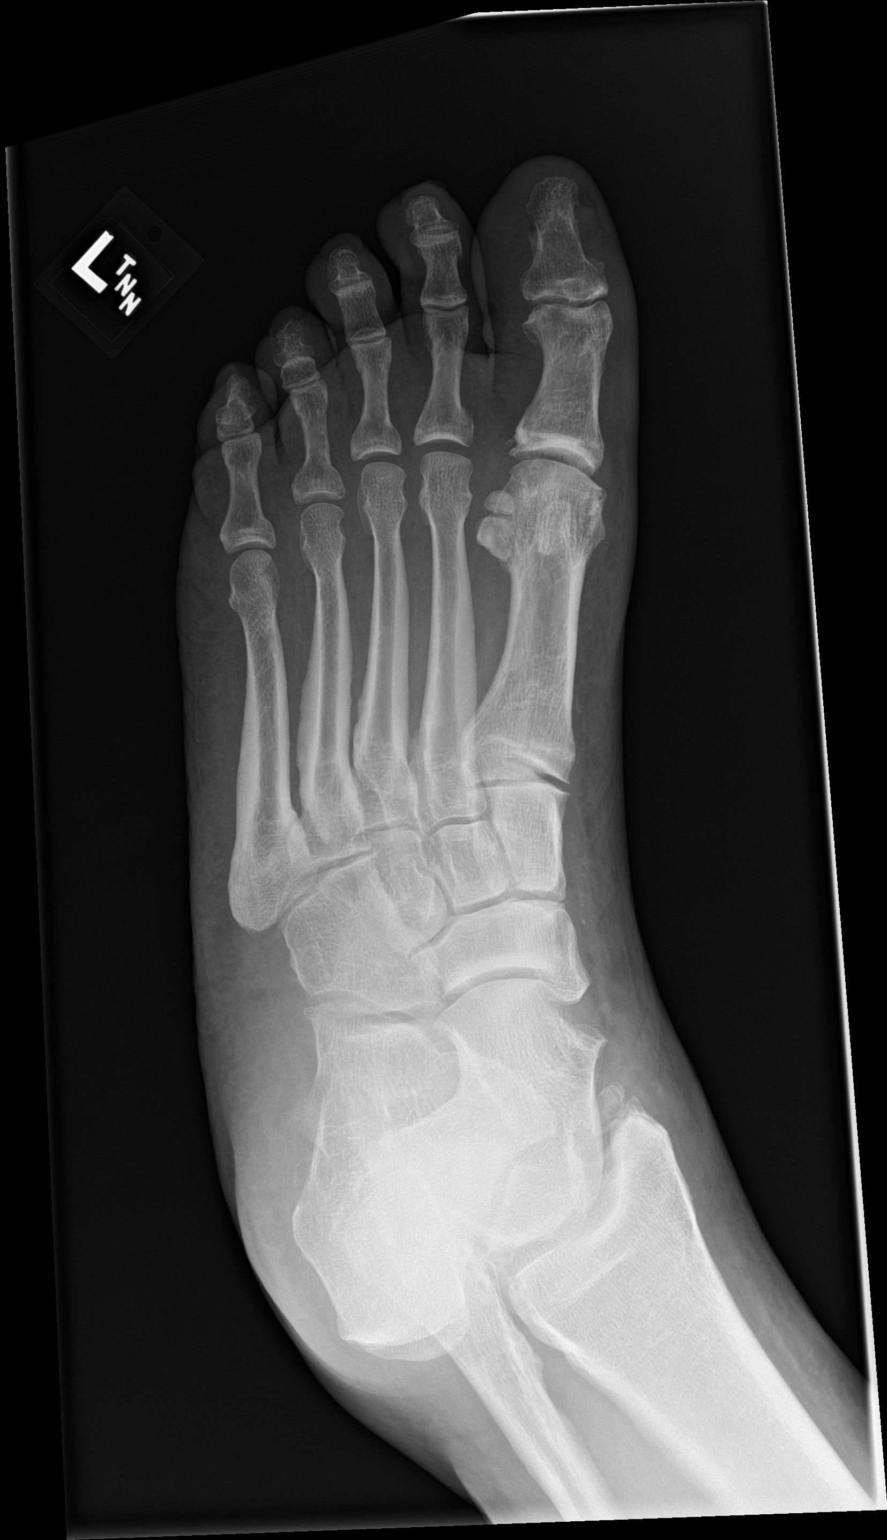

[foot lat]
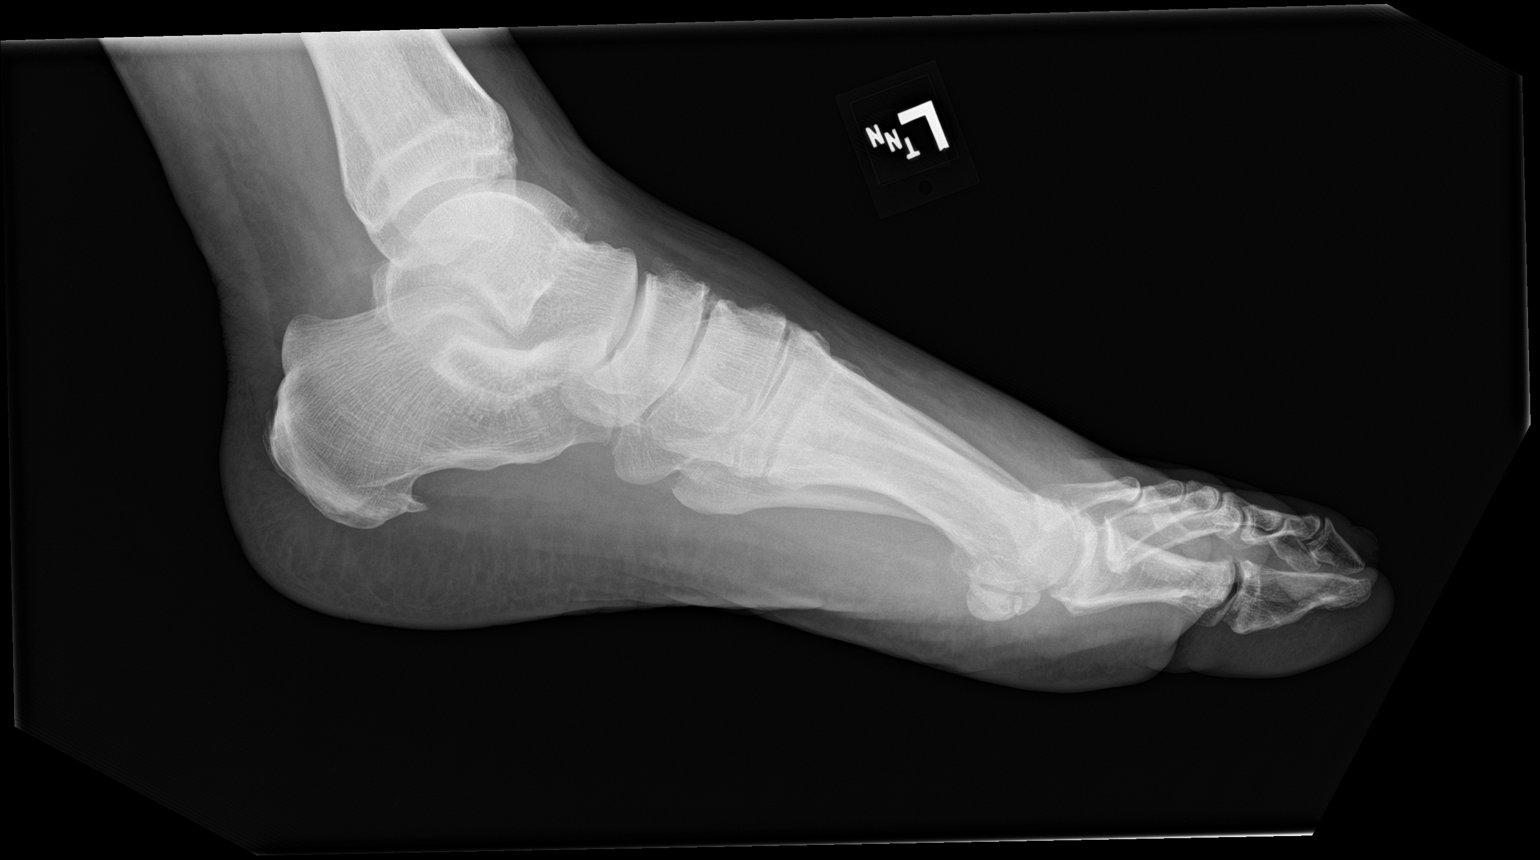

[3 of 3 positions shown; findings below may reference images not displayed]

FINDINGS: Diffuse soft tissue swelling. Small calcaneal spur. No acute
fracture or dislocation.
IMPRESSION: Soft tissue swelling, without acute osseous abnormality.

## 2018-10-20 IMAGING — CR CHEST - 2 VIEW
2 series · 2 of 2 positions shown · non-contrast
Comparison: [DATE]

CLINICAL DATA: Left foot pain.  Fever.

EXAM:
CHEST - 2 VIEW

[chest ap]
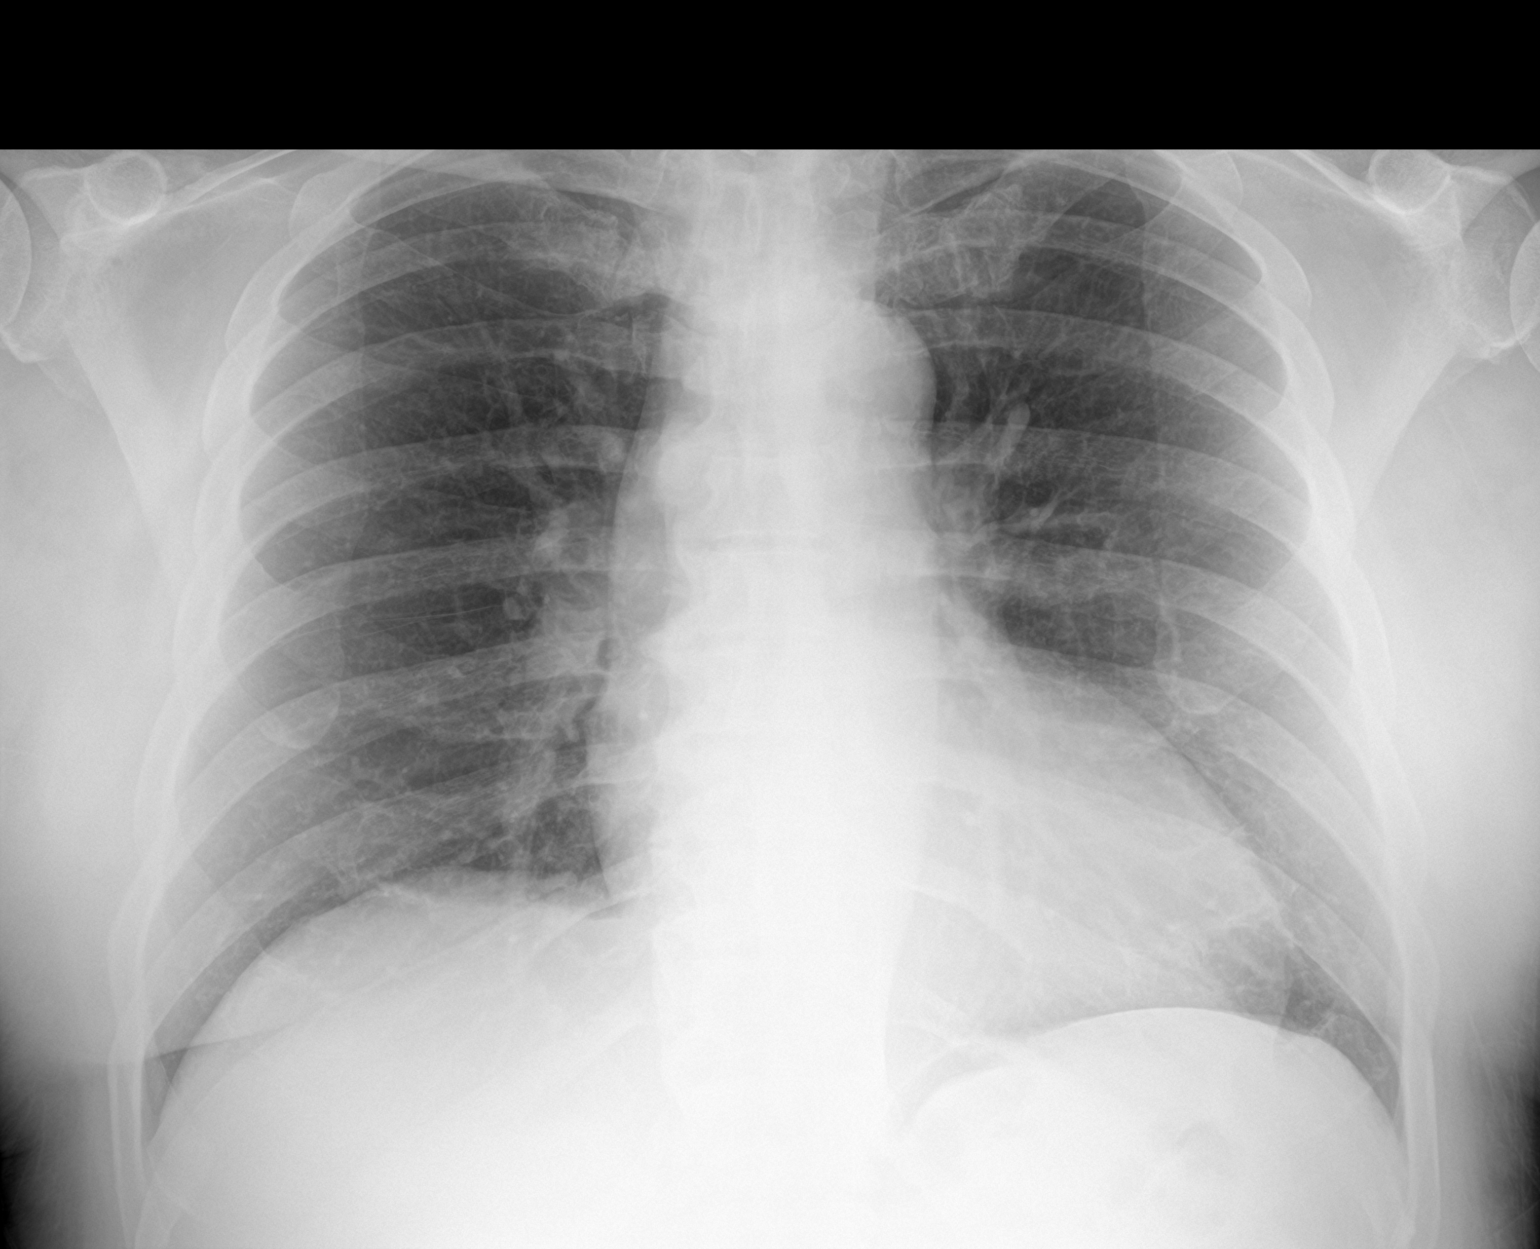

[chest lat]
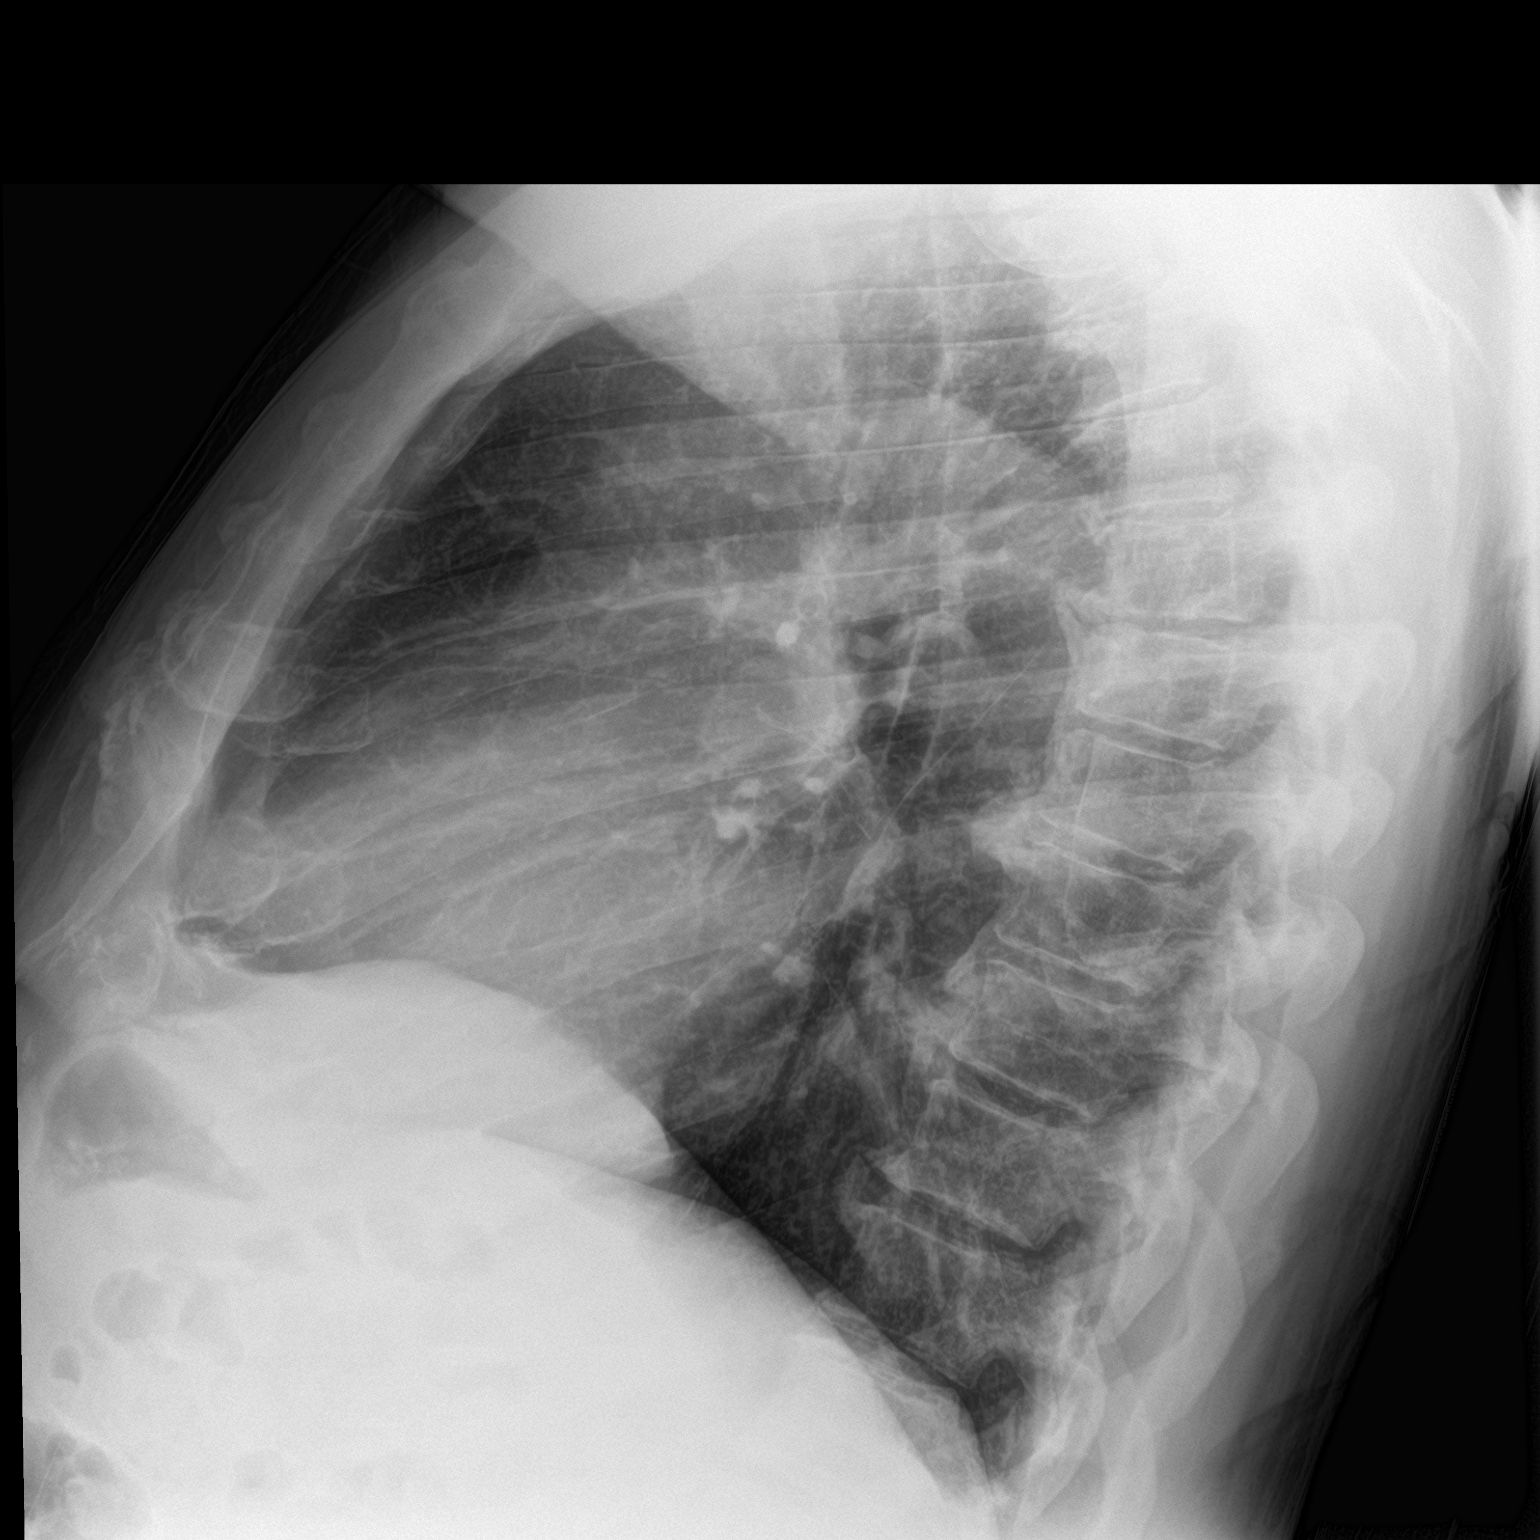

[2 of 2 positions shown; findings below may reference images not displayed]

FINDINGS: The heart size and mediastinal contours are within normal limits.
Both lungs are clear. The visualized skeletal structures are
unremarkable.
IMPRESSION: No active cardiopulmonary disease.

## 2018-10-20 MED ORDER — SODIUM CHLORIDE 0.9 % IV SOLN
2.0000 g | Freq: Once | INTRAVENOUS | Status: AC
  Administered 2018-10-20: 2 g via INTRAVENOUS
  Filled 2018-10-20: qty 2

## 2018-10-20 MED ORDER — SODIUM CHLORIDE 0.9% FLUSH
3.0000 mL | Freq: Once | INTRAVENOUS | Status: AC
  Administered 2018-10-20: 3 mL via INTRAVENOUS

## 2018-10-20 MED ORDER — KETOROLAC TROMETHAMINE 30 MG/ML IJ SOLN: 15.0000 mg | Freq: Once | INTRAMUSCULAR | Status: DC

## 2018-10-20 MED ORDER — SULFAMETHOXAZOLE-TRIMETHOPRIM 800-160 MG PO TABS: 1.0000 | ORAL_TABLET | Freq: Two times a day (BID) | ORAL | 0 refills | Status: AC

## 2018-10-20 MED ORDER — HYDROCORTISONE NA SUCCINATE PF 100 MG IJ SOLR
100.0000 mg | Freq: Once | INTRAMUSCULAR | Status: AC
  Administered 2018-10-20: 100 mg via INTRAVENOUS
  Filled 2018-10-20: qty 2

## 2018-10-20 MED ORDER — VANCOMYCIN HCL 10 G IV SOLR
2500.0000 mg | Freq: Once | INTRAVENOUS | Status: AC
  Administered 2018-10-20: 2500 mg via INTRAVENOUS
  Filled 2018-10-20: qty 2500

## 2018-10-20 MED ORDER — SODIUM CHLORIDE 0.9 % IV BOLUS
1000.0000 mL | Freq: Once | INTRAVENOUS | Status: AC
  Administered 2018-10-20: 1000 mL via INTRAVENOUS

## 2018-10-20 MED ORDER — HYDROMORPHONE HCL 1 MG/ML IJ SOLN
0.5000 mg | Freq: Once | INTRAMUSCULAR | Status: AC
  Administered 2018-10-20: 0.5 mg via INTRAVENOUS
  Filled 2018-10-20: qty 1

## 2018-10-20 NOTE — Telephone Encounter (Signed)
Isaac Cross with [facility] calling to report pt.'s left foot is more swollen today . He had cut a toe nail and now foot is warm to touch and swollen. Temp 99.6, pulse 110, BP 116/60, O2 sat 99%. Pt. Is currently on Doxycycline and Levaquin. Dr. Ancil Boozer paged and attempted to call [name] back x 2. Left message for [name] to call Dr. Ancil Boozer (on call) back at [phone]. Attempted to call pt.'s home - no answer.

## 2018-10-20 NOTE — ED Provider Notes (Signed)
[facility] Emergency Department Provider Note  ____________________________________________   First MD Initiated Contact with Patient 10/20/18 1523     (approximate)  I have reviewed the triage vital signs and the nursing notes.    HISTORY  Chief Complaint Foot Pain and Fever    HPI Isaac Cross is a 58 y.o. male with recent PMHx of   complex recent admission for severe sepsis secondary to infected knee replacements, status post month-long admission with stay complicated by adrenal insufficiency, here with right toe pain and swelling.  The patient states that he had been doing generally well.  He has been taking his medications and antibiotics as prescribed.  He states that he decided to cut his toenails yesterday for the first time since his hospitalization.  He had some mild foot swelling at the time, but not pain.  Over the last 24 hours, is began to develop progressive worsening aching, throbbing, severe, left foot pain.  He said associated increased redness, subjective chills, and reported temperature up to 99.8 at home.  This is new for him.  He denies any new trauma to the area other than cutting his toenails.  The pain began along his toenail, and now has spread proximally.  No other medical complaints.  The pain is aching, throbbing, worse with palpation and standing.  No alleviating factors       Past Medical History:  Diagnosis Date   Allergy    Anxiety    Arthritis    knees   Asthma, mild intermittent, well-controlled    Chronic obstructive asthma (HCC)    COPD (chronic obstructive pulmonary disease) (HCC)    Depression with anxiety    Diabetes (HCC)    GERD (gastroesophageal reflux disease)    HBP (high blood pressure)    High cholesterol    Renal cyst, right    Sleep apnea    CPAP   Swelling     Patient Active Problem List   Diagnosis Date Noted   Acute renal failure ([place])    Septic shock ([place])    Sepsis ([place])  09/12/2018   History of total knee arthroplasty 09/04/2018   Coronary atherosclerosis due to calcified coronary lesion 12/02/2017   Atherosclerosis of abdominal aorta ([place]) 06/17/2015   Fatty liver disease, nonalcoholic 78/29/5621   Renal cyst, right 06/17/2015   BPH (benign prostatic hyperplasia) 05/25/2015   Microscopic hematuria 05/25/2015   Second degree hemorrhoids    Diverticulosis of large intestine without diverticulitis    Asthma, mild intermittent, well-controlled 10/16/2014   Allergic rhinitis 10/13/2014   ED (erectile dysfunction) of organic origin 07/22/2014   Gastro-esophageal reflux disease without esophagitis 07/22/2014   Benign hypertension 07/22/2014   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;  Service: Endoscopy;  Laterality: N/A;  Diabetic - oral meds CPAP   HAND SURGERY     4TH AND 5TH FINGER   I&D KNEE  WITH POLY EXCHANGE Right 09/18/2018   Procedure: IRRIGATION AND DEBRIDEMENT KNEE WITH POLY EXCHANGE;  Surgeon: Lovell Sheehan, MD;  Location: [facility];  Service: Orthopedics;  Laterality: Right;   KNEE ARTHROSCOPY Right    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   TOTAL KNEE ARTHROPLASTY Left 09/17/2018   Procedure: I &D left knee and polyethylene exchange;  Surgeon: Lovell Sheehan, MD;  Location: [facility];  Service: Orthopedics;   Laterality: Left;   VEIN SURGERY Right     Prior to Admission medications   Medication Sig Start Date End Date Taking? Authorizing Provider  acetaminophen (TYLENOL) 500 MG tablet Take 1 tablet (500 mg total) by mouth every 6 (six) hours as needed. 12/08/16  Yes Sowles, Drue Stager, MD  acidophilus (RISAQUAD) CAPS capsule Take 1 capsule by mouth daily. 10/13/18  Yes Vaughan Basta, MD  albuterol (PROVENTIL HFA;VENTOLIN HFA) 108 (90 Base) MCG/ACT inhaler INHALE 2 PUFFS INTO THE LUNGS 4 (FOUR) TIMES DAILY. 04/02/18  Yes Steele Sizer, MD  aspirin 81 MG tablet Take 81 mg by mouth daily.   Yes [provider]  cetirizine (ZYRTEC) 10 MG tablet Take 10 mg by mouth daily.   Yes [provider]  citalopram (CELEXA) 20 MG tablet Take 1 tablet (20 mg total) by mouth daily. 06/07/18  Yes Sowles, Drue Stager, MD  diclofenac sodium (VOLTAREN) 1 % GEL APPLY 4 GRAMS TOPICALLY FOUR TIMES A DAY Patient taking differently: Apply 4 g topically 4 (four) times daily.  07/13/17  Yes Sowles, Drue Stager, MD  doxycycline (VIBRA-TABS) 100 MG tablet Take 1 tablet (100 mg total) by mouth every 12 (twelve) hours. 10/12/18  Yes Vaughan Basta, MD  fludrocortisone (FLORINEF) 0.1 MG tablet Take 1 tablet (0.1 mg total) by mouth 2 (two) times daily. 10/12/18  Yes Vaughan Basta, MD  fluticasone furoate-vilanterol (BREO ELLIPTA) 100-25 MCG/INH AEPB Inhale 1 puff into the lungs daily. 12/02/17  Yes Sowles, Drue Stager, MD  HYDROcodone-acetaminophen (NORCO/VICODIN) 5-325 MG tablet Take 1-2 tablets by mouth every 6 (six) hours as needed for moderate pain. 10/12/18  Yes Vaughan Basta, MD  hydrocortisone (CORTEF) 20 MG tablet Take 1 tablet (20 mg total) by mouth 2 (two) times daily. 10/12/18  Yes Vaughan Basta, MD  levofloxacin (LEVAQUIN) 500 MG tablet Take 1 tablet (500 mg total) by mouth daily for 14 days. 10/13/18 10/27/18 Yes Vaughan Basta, MD  lidocaine (XYLOCAINE) 2 % solution Use as  directed 15 mLs in the mouth or throat as needed for mouth pain. 03/15/18  Yes Sowles, Drue Stager, MD  Multiple Vitamins-Minerals (MENS MULTIVITAMIN PLUS) TABS Take by mouth.   Yes [provider]  Omega-3 Fatty Acids (FISH OIL) 1000 MG CAPS Take by mouth daily.   Yes [provider]  SENNA-PLUS 8.6-50 MG tablet Take 2 tablets by mouth every morning. 09/02/18  Yes [provider]  tamsulosin (FLOMAX) 0.4 MG CAPS capsule Take 1 capsule (0.4 mg total) by mouth daily. 10/16/18  Yes Sowles, Drue Stager, MD  triamcinolone cream (KENALOG) 0.1 % Apply 1 application topically 2 (two) times daily as needed (skin irritations).    Yes [provider]  warfarin (COUMADIN) 5 MG tablet Take 1 tablet (5 mg total) by mouth daily at 6 PM. 10/12/18  Yes Vaughan Basta, MD    Allergies Heparin, Lipitor [atorvastatin], Other, and Viagra  [sildenafil citrate]  Family History  Problem Relation Age of Onset   Hypertension Mother    Diabetes Mother    Diabetes Father    Hypertension Father  Cancer Father    Seizures Daughter    Lupus Daughter    Cancer Maternal Grandfather        Prostate   Diabetes Paternal Grandfather    Prostate cancer Maternal Uncle    Bladder Cancer Neg Hx    Kidney cancer Neg Hx     Social History Social History   Tobacco Use   Smoking status: Former Smoker    Packs/day: 1.00    Years: 10.00    Pack years: 10.00    Types: Cigarettes    Start date: 04/20/1995    Quit date: 04/19/2005    Years since quitting: 13.5   Smokeless tobacco: Never Used  Substance Use Topics   Alcohol use: Yes    Alcohol/week: 10.0 standard drinks    Types: 10 Cans of beer per week    Comment: occasional   Drug use: No    Review of Systems  Review of Systems  Constitutional: Positive for chills and fatigue. Negative for fever.  HENT: Negative for sore throat.   Respiratory: Negative for shortness of breath.   Cardiovascular: Negative for  chest pain.  Gastrointestinal: Negative for abdominal pain.  Genitourinary: Negative for flank pain.  Musculoskeletal: Positive for arthralgias. Negative for neck pain.  Skin: Positive for rash. Negative for wound.  Allergic/Immunologic: Negative for immunocompromised state.  Neurological: Positive for weakness. Negative for numbness.  Hematological: Does not bruise/bleed easily.  All other systems reviewed and are negative.    ____________________________________________  PHYSICAL EXAM:      VITAL SIGNS: ED Triage Vitals  Enc Vitals Group     BP 10/20/18 1459 108/69     Pulse Rate 10/20/18 1459 (!) 122     Resp 10/20/18 1459 16     Temp 10/20/18 1459 99.8 F (37.7 C)     Temp Source 10/20/18 1459 Oral     SpO2 10/20/18 1459 98 %     Weight 10/20/18 1502 (!) 316 lb (143.3 kg)     Height 10/20/18 1502 6\' 4"  (1.93 m)     Head Circumference --      Peak Flow --      Pain Score 10/20/18 1501 0     Pain Loc --      Pain Edu? --      Excl. in [place]? --      Physical Exam Vitals signs and nursing note reviewed.  Constitutional:      General: He is not in acute distress.    Appearance: He is well-developed.  HENT:     Head: Normocephalic and atraumatic.     Mouth/Throat:     Mouth: Mucous membranes are dry.  Eyes:     Conjunctiva/sclera: Conjunctivae normal.  Neck:     Musculoskeletal: Neck supple.  Cardiovascular:     Rate and Rhythm: Regular rhythm. Tachycardia present.     Heart sounds: Normal heart sounds. No murmur. No friction rub.  Pulmonary:     Effort: Pulmonary effort is normal. No respiratory distress.     Breath sounds: Normal breath sounds. No wheezing or rales.  Abdominal:     General: There is no distension.     Palpations: Abdomen is soft.     Tenderness: There is no abdominal tenderness.  Skin:    General: Skin is warm.     Capillary Refill: Capillary refill takes less than 2 seconds.  Neurological:     Mental Status: He is alert and oriented to  person, place, and time.  Motor: No abnormal muscle tone.      LOWER EXTREMITY EXAM: Left  INSPECTION & PALPATION: Moderate tenderness to palpation and swelling along the great toe.  There is significant tenderness overlying the first MTP.  No palpable nodules.  No fluctuance.  No drainage.  SENSORY: sensation is intact to light touch in:  Superficial peroneal nerve distribution (over dorsum of foot) Deep peroneal nerve distribution (over first dorsal web space) Sural nerve distribution (over lateral aspect 5th metatarsal) Saphenous nerve distribution (over medial instep)  MOTOR:  + Motor EHL (great toe dorsiflexion) + FHL (great toe plantar flexion)  + TA (ankle dorsiflexion)  + GSC (ankle plantar flexion)  VASCULAR: 2+ dorsalis pedis and posterior tibialis pulses Capillary refill < 2 sec, toes warm and well-perfused  COMPARTMENTS: Soft, warm, well-perfused No pain with passive extension No parethesias   __________________________________________   LABS (all labs ordered are listed, but only abnormal results are displayed)  Labs Reviewed  COMPREHENSIVE METABOLIC PANEL - Abnormal; Notable for the following components:      Result Value   CO2 19 (*)    Glucose, Bld 125 (*)    Calcium 8.8 (*)    Albumin 2.8 (*)    All other components within normal limits  CBC WITH DIFFERENTIAL/PLATELET - Abnormal; Notable for the following components:   RBC 2.99 (*)    Hemoglobin 7.6 (*)    HCT 23.8 (*)    MCV 79.6 (*)    MCH 25.4 (*)    RDW 17.0 (*)    Platelets 535 (*)    All other components within normal limits  PROTIME-INR - Abnormal; Notable for the following components:   Prothrombin Time 18.1 (*)    INR 1.5 (*)    All other components within normal limits  CULTURE, BLOOD (ROUTINE X 2)  CULTURE, BLOOD (ROUTINE X 2)  NOVEL CORONAVIRUS, NAA (HOSPITAL ORDER, SEND-OUT TO REF LAB)  LACTIC ACID, PLASMA  LACTIC ACID, PLASMA  URINALYSIS, COMPLETE (UACMP) WITH  MICROSCOPIC  SEDIMENTATION RATE  C-REACTIVE PROTEIN    ____________________________________________  EKG: None ________________________________________  RADIOLOGY All imaging, including plain films, CT scans, and ultrasounds, independently reviewed by me, and interpretations confirmed via formal radiology reads.  ED MD interpretation:   Chest x-ray: Negative X-ray foot left: Negative, soft tissue swelling  Official radiology report(s): Dg Chest 2 View  Result Date: 10/20/2018 CLINICAL DATA:  Left foot pain.  Fever. EXAM: CHEST - 2 VIEW COMPARISON:  September 21, 2018 FINDINGS: The heart size and mediastinal contours are within normal limits. Both lungs are clear. The visualized skeletal structures are unremarkable. IMPRESSION: No active cardiopulmonary disease. Electronically Signed   By: Dorise Bullion III M.D   On: 10/20/2018 15:56   Dg Foot Complete Left  Result Date: 10/20/2018 CLINICAL DATA:  Left foot edema for 1 day. History of diabetes. No prior injury or surgery. EXAM: LEFT FOOT - COMPLETE 3+ VIEW COMPARISON:  None. FINDINGS: Diffuse soft tissue swelling. Small calcaneal spur. No acute fracture or dislocation. IMPRESSION: Soft tissue swelling, without acute osseous abnormality. Electronically Signed   By: Abigail Miyamoto M.D.   On: 10/20/2018 16:29    ____________________________________________  PROCEDURES   Procedure(s) performed (including Critical Care):  .Critical Care Performed by: Duffy Bruce, MD Authorized by: Duffy Bruce, MD   Critical care provider statement:    Critical care time (minutes):  35   Critical care time was exclusive of:  Separately billable procedures and treating other patients and teaching time  Critical care was necessary to treat or prevent imminent or life-threatening deterioration of the following conditions:  Cardiac failure, circulatory failure and sepsis   Critical care was time spent personally by me on the following activities:   Development of treatment plan with patient or surrogate, discussions with consultants, evaluation of patient's response to treatment, examination of patient, obtaining history from patient or surrogate, ordering and performing treatments and interventions, ordering and review of laboratory studies, ordering and review of radiographic studies, pulse oximetry, re-evaluation of patient's condition and review of old charts   I assumed direction of critical care for this patient from another provider in my specialty: no      ____________________________________________  INITIAL IMPRESSION / MDM / [place] / ED COURSE  As part of my medical decision making, I reviewed the following data within the electronic MEDICAL RECORD NUMBER Notes from prior ED visits and [place] Controlled Substance Database      *MARIANO DOSHI was evaluated in Emergency Department on 10/20/2018 for the symptoms described in the history of present illness. He was evaluated in the context of the global COVID-19 pandemic, which necessitated consideration that the patient might be at risk for infection with the SARS-CoV-2 virus that causes COVID-19. Institutional protocols and algorithms that pertain to the evaluation of patients at risk for COVID-19 are in a state of rapid change based on information released by regulatory bodies including the CDC and federal and state organizations. These policies and algorithms were followed during the patient's care in the ED.  Some ED evaluations and interventions may be delayed as a result of limited staffing during the pandemic.*   Clinical Course as of Oct 19 1744  Fri Oct 19, 6153  2137 58 year old male with complex recent history here with left toe pain.  Exam is concerning for possible acute cellulitis, though gout could have a similar presentation.  However, given his complex history and immune suppressed status given adrenal insufficiency, on chronic Solu-Cortef, as well as low-grade  fever, will treat as infection until proven otherwise.  Will start fluids, empiric antibiotics.  Screening labs show baseline anemia.  He is not appear to be actively bleeding at this time.  Renal function seems to be steadily improving since his recent discharge, though his bicarb is decreased today.  He is mildly tachycardic.  Will start fluids, broad antibiotics, and follow-up plain films.   [CI]  1628 Chest x-ray negative.  Plain films are pending of the foot.  Given his immunosuppressed status and complex recent admission with minor local trauma which would be a set up for local cellulitis, suspect will need to be admitted as he is already on p.o. antibiotics.  If plain films show significant evidence of gouty arthritis of the MTP, could consider attempted outpatient management, but will follow-up.   [CI]    Clinical Course User Index [CI] Duffy Bruce, MD    Medical Decision Making: 58 year old male with extensive past medical history including recent history of septic shock secondary to infected knee replacements, with stay complicated by adrenal insufficiency, here with what I suspect is early cellulitis of left toe.  Differential includes possible gout given first MTP involvement only, but given his immune no suppression and history, will cover empirically with IV ABX.  Recheck rectal temp less than 100.4.  Plain films reviewed and are negative.  Lab work actually is overall reassuring with no leukocytosis and normal lactic acid.  His renal function is markedly improved.  Given his immunosuppression,  however, will continue empiric IV antibiotics, stress dose steroids given, and will admit.  Addendum: While awaiting admission with the hospitalist, patient states he does not wish to stay.  He fully understands the risks of leaving AMA, including worsening infection with subsequent osteomyelitis, recurrent knee infection requiring multiple additional surgeries and potentially loss of limb as  well as for repeat knee replacement, as well as sepsis and death.  The patient is calm, cooperative, but states that due to his recent prolonged hospitalization he wants to be home for the holiday.  He will have home health check on him tomorrow, will take pictures of his foot regularly, and will call his primary care doctor.  I discussed antibiotic changes with pharmacy, and will switch to add Bactrim for additional coverage.  He will use topical Voltaren gel for possible gout.  Hesitant to start additional medications for gout given his immunosuppression.  We had a long discussion about the high risk of decompensation, particularly with his history of adrenal insufficiency, and he is adamant about returning home.  He accepts the risks of doing so.  ____________________________________________  FINAL CLINICAL IMPRESSION(S) / ED DIAGNOSES  Final diagnoses:  Sepsis due to cellulitis ([place])  Cellulitis of great toe of left foot     MEDICATIONS GIVEN DURING THIS VISIT:  Medications  vancomycin (VANCOCIN) 2,500 mg in sodium chloride 0.9 % 500 mL IVPB (2,500 mg Intravenous New Bag/Given 10/20/18 1618)  sodium chloride flush (NS) 0.9 % injection 3 mL (3 mLs Intravenous Given 10/20/18 1605)  sodium chloride 0.9 % bolus 1,000 mL (1,000 mLs Intravenous New Bag/Given 10/20/18 1605)  HYDROmorphone (DILAUDID) injection 0.5 mg (0.5 mg Intravenous Given 10/20/18 1606)  ceFEPIme (MAXIPIME) 2 g in sodium chloride 0.9 % 100 mL IVPB (0 g Intravenous Stopped 10/20/18 1618)  hydrocortisone sodium succinate (SOLU-CORTEF) 100 MG injection 100 mg (100 mg Intravenous Given 10/20/18 1638)  sodium chloride 0.9 % bolus 1,000 mL (1,000 mLs Intravenous New Bag/Given 10/20/18 1640)     ED Discharge Orders    None       Note:  This document was prepared using Dragon voice recognition software and may include unintentional dictation errors.   Duffy Bruce, MD 10/20/18 [phone]

## 2018-10-20 NOTE — Telephone Encounter (Signed)
Isaac Cross, [place], Calling to report that the patient clipped his nails Appear to be Small inflection warm to the touch, temp 99.6, pulse 110 BP-116/60 Blood Sugar 150, Oxygen -99% on room air, Right mid calf cir -42 centimeters, Left mid calf cir- 36 centimeters, Recently discharge from ICU hospital with Doxycycline 100mg  daily, Levofloxacil 500mg  daily,   Not sure if anitbotics would tackle his foot. But does look like infection. He has ademia in that foot, no drainage.  Please advise. Is there anything that Dr. Ancil Boozer would like [place] to do if anything.  Appt is scheduled on Monday.  [phone]

## 2018-10-20 NOTE — Discharge Instructions (Addendum)
As we discussed, I highly recommend admission.  There is a very high chance that your toe is infected, and that failure to receive IV antibiotics could result in worsening infection causing infection of the bone of your foot, repeat infection of your knees causing repeat knee replacement and possibly loss of your limbs, as well as death given your history of recent prolonged hospitalization and adrenal insufficiency.  For now, you have been given an IV dose of vancomycin and cefepime, 2 antibiotics.  Were going to add Bactrim to your current regimen.  You can apply the topical Voltaren gel to the area of redness, to help with pain.  Continue your steroids.  Please return to the ER immediately if you change your mind, symptoms worsen including any worsening pain, redness, swelling, or wounds.

## 2018-10-20 NOTE — ED Triage Notes (Signed)
Pt to ED via POV c/o left foot pain and fever. Pt states that he called his PCP who referred him here. Pt had bilateral knee replacements on 4/25. Pt was admitted to the hospital on 5/26 and was discharged about 5 days ago. Pt reports that this morning he woke up and his left foot was swollen and painful. Pt reports temp at home of 99.6. pt is in NAD at this time.

## 2018-10-20 NOTE — Progress Notes (Signed)
PHARMACY -  BRIEF ANTIBIOTIC NOTE   Pharmacy has received consult(s) for Vancomycin and Cefepime from an ED provider.  The patient's profile has been reviewed for ht/wt/allergies/indication/available labs.    One time order(s) placed for Vancomycin 2500mg  IV and Cefepime 2g IV  Further antibiotics/pharmacy consults should be ordered by admitting physician if indicated.                       Thank you, [name] 10/20/2018  3:55 PM

## 2018-10-23 ENCOUNTER — Other Ambulatory Visit: Payer: Self-pay

## 2018-10-23 ENCOUNTER — Ambulatory Visit: Payer: BC Managed Care – PPO | Admitting: Family Medicine

## 2018-10-23 ENCOUNTER — Encounter: Payer: Self-pay | Admitting: Family Medicine

## 2018-10-23 VITALS — BP 120/80 | HR 105 | Temp 97.5°F | Resp 16 | Ht 75.0 in | Wt 283.6 lb

## 2018-10-23 DIAGNOSIS — Z09 Encounter for follow-up examination after completed treatment for conditions other than malignant neoplasm: Secondary | ICD-10-CM

## 2018-10-23 DIAGNOSIS — Z7901 Long term (current) use of anticoagulants: Secondary | ICD-10-CM | POA: Diagnosis not present

## 2018-10-23 DIAGNOSIS — E278 Other specified disorders of adrenal gland: Secondary | ICD-10-CM | POA: Diagnosis not present

## 2018-10-23 DIAGNOSIS — Z5181 Encounter for therapeutic drug level monitoring: Secondary | ICD-10-CM

## 2018-10-23 DIAGNOSIS — I824Z1 Acute embolism and thrombosis of unspecified deep veins of right distal lower extremity: Secondary | ICD-10-CM | POA: Diagnosis not present

## 2018-10-23 DIAGNOSIS — E2740 Unspecified adrenocortical insufficiency: Secondary | ICD-10-CM

## 2018-10-23 DIAGNOSIS — L03116 Cellulitis of left lower limb: Secondary | ICD-10-CM

## 2018-10-23 DIAGNOSIS — D500 Iron deficiency anemia secondary to blood loss (chronic): Secondary | ICD-10-CM | POA: Diagnosis not present

## 2018-10-23 LAB — POCT INR: INR: 1.7 — AB (ref 2.0–3.0)

## 2018-10-23 MED ORDER — FERROUS SULFATE 325 (65 FE) MG PO TBEC: 325.0000 mg | DELAYED_RELEASE_TABLET | Freq: Three times a day (TID) | ORAL | 2 refills | Status: DC

## 2018-10-23 NOTE — Progress Notes (Signed)
Name: Isaac Cross   MRN: [id number]    DOB: 12-Oct-1960   Date:10/23/2018       Progress Note  Subjective  Chief Complaint  Chief Complaint  Patient presents with  . Anticoagulation    Medication was adjusted on Thursday  . elevated heart rate    He was taken off of atenolol and that usually kept his heart rate regulated.     [facility] discharge follow up: Isaac Cross is here today for a complicated hospital stay, he went to [facility] on 09/12/2018 after bilateral knee replacement on 09/01/2018. He was admitted with diagnosis of sepsis and acute renal failure. During his hospital stay blood cultures were negative, he had I&D of both knees but cultures also did not show any bacteria. However he was on antifungals and antibiotics. During his stay he had multiple sub specialist evaluation , infectious disease, cardiologist, nephrologist, neurologist  and psychiatrist.  He was found to have a mass on adrenal glands and once started on steroids and florinef. His encephalopathy and delirium improved, his electrolytes and kidney function improved, he was also diagnosed with a DVT on LLE and was sent home on coumadin, but level has not been therapeutic. He went home with his wife. He also has home health with nursing and PT. He states his appetite is okay but he has been eating his meals and taking protein shake. He feels very tired, he has some SOB with activity but has been stable and likely from deconditioning and anemia. He does not have pain on left leg, but some discomfort on right lateral calf. He went back to [facility] on July 3rd with redness and pain on left foot after trimming his own toe nails, he was advised to be admitted but he asked to go home for the holiday. He was given Septra, he has been taking medication as prescribed and pain has resolved, erythema is down.   Adrenal mass: referral placed for Endo - he saw Dr. Honor Junes last week and is taking cortisone as recommended,  and we will repeat MRI  as recommended by ID   Foot cellulitis: went to [facility] on July 3rd and left AMA with new rx of antibiotics. He is doing well, area of redness resolved and pain is under control now   Right calf DVT: started on coumadin    Patient Active Problem List   Diagnosis Date Noted  . Acute renal failure ([place])   . Septic shock ([place])   . Sepsis ([place]) 09/12/2018  . History of total knee arthroplasty 09/04/2018  . Coronary atherosclerosis due to calcified coronary lesion 12/02/2017  . Atherosclerosis of abdominal aorta ([place]) 06/17/2015  . Fatty liver disease, nonalcoholic 79/39/0300  . Renal cyst, right 06/17/2015  . BPH (benign prostatic hyperplasia) 05/25/2015  . Microscopic hematuria 05/25/2015  . Second degree hemorrhoids   . Diverticulosis of large intestine without diverticulitis   . Asthma, mild intermittent, well-controlled 10/16/2014  . Allergic rhinitis 10/13/2014  . ED (erectile dysfunction) of organic origin 07/22/2014  . Gastro-esophageal reflux disease without esophagitis 07/22/2014  . Benign hypertension 07/22/2014  . 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[place]) 07/22/2014  . Adult BMI 30+ 07/22/2014  . Chronic obstructive asthma ([place]) 07/22/2014  . Depression with anxiety 07/22/2014  . Dyslipidemia 07/22/2014  . Genital herpes 07/22/2014  . 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 Osteoarthritis of both knees 07/22/2014  . Obstructive apnea 05/14/2013  . 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 Blood in the  urine 09/20/2006    Past Surgical History:  Procedure Laterality Date  . BLADDER REPAIR    . COLONOSCOPY WITH PROPOFOL N/A 04/07/2015   Procedure: COLONOSCOPY WITH PROPOFOL;  Surgeon: Lucilla Lame, MD;  Location: [facility];  Service: Endoscopy;  Laterality: N/A;  Diabetic - oral meds CPAP  . HAND SURGERY     4TH AND 5TH FINGER  . I&D KNEE WITH POLY EXCHANGE Right 09/18/2018   Procedure: IRRIGATION AND DEBRIDEMENT KNEE WITH POLY EXCHANGE;  Surgeon:  Lovell Sheehan, MD;  Location: [facility];  Service: Orthopedics;  Laterality: Right;  . KNEE ARTHROSCOPY Right   . 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 TOTAL KNEE ARTHROPLASTY Left 09/17/2018   Procedure: I &D left knee and polyethylene exchange;  Surgeon: Lovell Sheehan, MD;  Location: [facility];  Service: Orthopedics;  Laterality: Left;  Marland Kitchen VEIN SURGERY Right     Family History  Problem Relation Age of Onset  . Hypertension Mother   . Diabetes Mother   . Diabetes Father   . Hypertension Father   . Cancer Father   . Seizures Daughter   . Lupus Daughter   . Cancer Maternal Grandfather        Prostate  . Diabetes Paternal Grandfather   . Prostate cancer Maternal Uncle   . Bladder Cancer Neg Hx   . Kidney cancer Neg Hx     Social History   Socioeconomic History  . Marital status: Married    Spouse name: Not on file  . Number of children: 2  . Years of education: Not on file  . Highest education level: Bachelor's degree (e.g., BA, AB, BS)  Occupational History  . Occupation: case Freight forwarder   Social Needs  . Financial resource strain: Not hard at all  . Food insecurity    Worry: Never true    Inability: Never true  . Transportation needs    Medical: No    Non-medical: No  Tobacco Use  . Smoking status: Former Smoker    Packs/day: 1.00    Years: 10.00    Pack years: 10.00    Types: Cigarettes    Start date: 04/20/1995    Quit date: 04/19/2005    Years since quitting: 13.5  . Smokeless tobacco: Never Used  Substance and Sexual Activity  . Alcohol use: Yes    Alcohol/week: 10.0 standard drinks    Types: 10 Cans of beer per week    Comment: occasional  . Drug use: No  . Sexual activity: Yes    Partners: Female  Lifestyle  . Physical activity    Days per week: 0 days    Minutes per session: 0 min  . Stress: Not at all  Relationships  . Social connections    Talks on phone: More than three times a week    Gets together: Once a week    Attends  religious service: Never    Active member of club or organization: No    Attends meetings of clubs or organizations: Never    Relationship status: Married  . Intimate partner violence    Fear of current or ex partner: No    Emotionally abused: No    Physically abused: No    Forced sexual activity: No  Other Topics Concern  . Not on file  Social History Narrative   Lives at home with wife, using a walker since his surgery recently.     Current Outpatient Medications:  .  acetaminophen (  TYLENOL) 500 MG tablet, Take 1 tablet (500 mg total) by mouth every 6 (six) hours as needed., Disp: 90 tablet, Rfl: 0 .  acidophilus (RISAQUAD) CAPS capsule, Take 1 capsule by mouth daily., Disp: 30 capsule, Rfl: 0 .  albuterol (PROVENTIL HFA;VENTOLIN HFA) 108 (90 Base) MCG/ACT inhaler, INHALE 2 PUFFS INTO THE LUNGS 4 (FOUR) TIMES DAILY., Disp: 6.7 Inhaler, Rfl: 0 .  aspirin 81 MG tablet, Take 81 mg by mouth daily., Disp: , Rfl:  .  cetirizine (ZYRTEC) 10 MG tablet, Take 10 mg by mouth daily., Disp: , Rfl:  .  citalopram (CELEXA) 20 MG tablet, Take 1 tablet (20 mg total) by mouth daily., Disp: 90 tablet, Rfl: 1 .  diclofenac sodium (VOLTAREN) 1 % GEL, APPLY 4 GRAMS TOPICALLY FOUR TIMES A DAY (Patient taking differently: Apply 4 g topically 4 (four) times daily. ), Disp: 100 g, Rfl: 2 .  doxycycline (VIBRA-TABS) 100 MG tablet, Take 1 tablet (100 mg total) by mouth every 12 (twelve) hours., Disp: 24 tablet, Rfl: 0 .  fludrocortisone (FLORINEF) 0.1 MG tablet, Take 1 tablet (0.1 mg total) by mouth 2 (two) times daily., Disp: 60 tablet, Rfl: 0 .  fluticasone furoate-vilanterol (BREO ELLIPTA) 100-25 MCG/INH AEPB, Inhale 1 puff into the lungs daily., Disp: 180 each, Rfl: 1 .  HYDROcodone-acetaminophen (NORCO/VICODIN) 5-325 MG tablet, Take 1-2 tablets by mouth every 6 (six) hours as needed for moderate pain., Disp: 20 tablet, Rfl: 0 .  hydrocortisone (CORTEF) 20 MG tablet, Take 1 tablet (20 mg total) by mouth 2  (two) times daily., Disp: 60 tablet, Rfl: 0 .  levofloxacin (LEVAQUIN) 500 MG tablet, Take 1 tablet (500 mg total) by mouth daily for 14 days., Disp: 14 tablet, Rfl: 0 .  lidocaine (XYLOCAINE) 2 % solution, Use as directed 15 mLs in the mouth or throat as needed for mouth pain., Disp: 100 mL, Rfl: 0 .  Multiple Vitamins-Minerals (MENS MULTIVITAMIN PLUS) TABS, Take by mouth., Disp: , Rfl:  .  Omega-3 Fatty Acids (FISH OIL) 1000 MG CAPS, Take by mouth daily., Disp: , Rfl:  .  SENNA-PLUS 8.6-50 MG tablet, Take 2 tablets by mouth every morning., Disp: , Rfl:  .  sulfamethoxazole-trimethoprim (BACTRIM DS) 800-160 MG tablet, Take 1 tablet by mouth 2 (two) times daily for 10 days., Disp: 20 tablet, Rfl: 0 .  tamsulosin (FLOMAX) 0.4 MG CAPS capsule, Take 1 capsule (0.4 mg total) by mouth daily., Disp: 90 capsule, Rfl: 0 .  triamcinolone cream (KENALOG) 0.1 %, Apply 1 application topically 2 (two) times daily as needed (skin irritations). , Disp: , Rfl:  .  warfarin (COUMADIN) 5 MG tablet, Take 1 tablet (5 mg total) by mouth daily at 6 PM., Disp: 14 tablet, Rfl: 0 .  ferrous sulfate 325 (65 FE) MG EC tablet, Take 1 tablet (325 mg total) by mouth 3 (three) times daily with meals., Disp: 90 tablet, Rfl: 2  Allergies  Allergen Reactions  . 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 Lipitor [Atorvastatin] Hives and Itching  . Other   . Viagra  [Sildenafil Citrate]     vision loss    I personally reviewed active problem list, medication list, allergies, family history, social history with the patient/caregiver today.   ROS  Ten systems reviewed and is negative except as mentioned in HPI   Objective  Vitals:   10/23/18 1251  BP: 120/80  Pulse: (!) 105  Resp: 16  Temp: (!) 97.5 F (36.4  C)  TempSrc: Oral  SpO2: 98%  Weight: 283 lb 9.6 oz (128.6 kg)  Height: 6\' 3"  (1.905 m)    Body mass index is 35.45 kg/m.  Physical Exam  Constitutional: Patient appears  well-developed and well-nourished. Obese  No distress.  HEENT: head atraumatic, normocephalic, pupils equal and reactive to light, , neck supple Cardiovascular: Normal rate, regular rhythm and normal heart sounds.  No murmur heard. Trace  BLE edema. Mild lateral right calf tenderness during palpation and also varicose veins , but nothing on left side  Muscular skeletal: well healed knee scars, no erythema, mild increase in warmth Pulmonary/Chest: Effort normal and breath sounds normal. No respiratory distress. Abdominal: Soft.  There is no tenderness. Psychiatric: Patient has a normal mood and affect. behavior is normal. Judgment and thought content normal.  PHQ2/9: Depression screen [facility] 2/9 10/23/2018 09/05/2018 03/06/2018 12/02/2017 07/13/2017  Decreased Interest 0 0 0 0 0  Down, Depressed, Hopeless 0 0 0 0 0  PHQ - 2 Score 0 0 0 0 0  Altered sleeping 0 0 0 0 -  Tired, decreased energy 0 0 0 1 -  Change in appetite 0 0 0 0 -  Feeling bad or failure about yourself  0 0 0 0 -  Trouble concentrating 0 0 0 0 -  Moving slowly or fidgety/restless 0 0 0 0 -  Suicidal thoughts 0 0 0 0 -  PHQ-9 Score 0 0 0 1 -  Difficult doing work/chores - Not difficult at all Not difficult at all Not difficult at all -    phq 9 is negative   Fall Risk: Fall Risk  10/23/2018 09/05/2018 06/07/2018 03/15/2018 03/06/2018  Falls in the past year? 0 0 0 0 0  Number falls in past yr: 0 0 0 - 0  Injury with Fall? 0 0 0 - 0     Assessment & Plan  1. Encounter for monitoring Coumadin therapy  - POCT INR  2. Mass of both adrenal glands ([place])  - MR ABDOMEN WO CONTRAST; Future  3. Anemia due to blood loss  - ferrous sulfate 325 (65 FE) MG EC tablet; Take 1 tablet (325 mg total) by mouth 3 (three) times daily with meals.  Dispense: 90 tablet; Refill: 2  4. DVT, lower extremity, distal, acute, right ([place])  On coumadin, dose just adjusted on Saturday taking half extra pill twice weekly and will have it recheck at  home by home health agency on Thursday   5. Hospital discharge follow-up  Discussed hospital stay and the findings from lab results, answered questions  6. Adrenal insufficiency ([place])  Seen by Dr. Honor Junes, taking cortisone twice daily , reminded him to get emergency bracelet  7. Cellulitis of left foot  Continue Septra given by [facility] Saturday, area of erythema is significantly smaller based on sharpie marker

## 2018-10-24 ENCOUNTER — Encounter: Payer: Self-pay | Admitting: Family Medicine

## 2018-10-25 ENCOUNTER — Encounter: Payer: Self-pay | Admitting: Family Medicine

## 2018-10-25 ENCOUNTER — Other Ambulatory Visit: Payer: Self-pay

## 2018-10-25 ENCOUNTER — Other Ambulatory Visit: Payer: Self-pay | Admitting: Family Medicine

## 2018-10-25 DIAGNOSIS — Z5181 Encounter for therapeutic drug level monitoring: Secondary | ICD-10-CM

## 2018-10-25 LAB — CULTURE, BLOOD (ROUTINE X 2)
Culture: NO GROWTH
Culture: NO GROWTH
Special Requests: ADEQUATE

## 2018-10-25 MED ORDER — WARFARIN SODIUM 5 MG PO TABS: 5.0000 mg | ORAL_TABLET | Freq: Every day | ORAL | 0 refills | Status: DC

## 2018-10-26 ENCOUNTER — Telehealth: Payer: Self-pay | Admitting: Family Medicine

## 2018-10-26 ENCOUNTER — Other Ambulatory Visit: Payer: Self-pay | Admitting: Family Medicine

## 2018-10-26 MED ORDER — WARFARIN SODIUM 5 MG PO TABS: 5.0000 mg | ORAL_TABLET | Freq: Every day | ORAL | 0 refills | Status: DC

## 2018-10-26 NOTE — Telephone Encounter (Signed)
Isaac Cross calling with [facility] called to report INR 1.4. please advise

## 2018-10-27 NOTE — Telephone Encounter (Signed)
Informed patient to take Warfarin 5 mg once daily and then alternate every other day to take Warfarin 7.5 mg. Recheck INR in one week on Thursday November 02, 2018. Also Isaac Cross was notified of dosage change and given verbal order to recheck INR in 1 week.

## 2018-11-01 LAB — ACID FAST SMEAR (AFB, MYCOBACTERIA): Acid Fast Smear: NEGATIVE

## 2018-11-02 ENCOUNTER — Telehealth: Payer: Self-pay

## 2018-11-02 LAB — POCT INR: INR: 1.2 — AB (ref 0.9–1.1)

## 2018-11-02 NOTE — Telephone Encounter (Signed)
Misty from [facility] called to report patient INR results as 1.2. She will be discharging him today, so you will have to manage his coagulation therapy. Misty reports that the patient would like to start Eliquis or alternate medication so he does not have to continue getting labs checked. Please advise. Misty: [phone]

## 2018-11-02 NOTE — Telephone Encounter (Signed)
Copied from [place] [id number]. Topic: General - Other >> Nov 02, 2018 10:09 AM Rainey Pines A wrote: Rojelio Brenner from [facility] called to report to provider  patients INR value and would like a callback at [phone].

## 2018-11-03 NOTE — Telephone Encounter (Signed)
No appointments available in one week. He has an appointment on 11/14/2018 can he wait until then.

## 2018-11-05 ENCOUNTER — Other Ambulatory Visit: Payer: Self-pay | Admitting: Family Medicine

## 2018-11-05 MED ORDER — RIVAROXABAN 20 MG PO TABS: 20.0000 mg | ORAL_TABLET | Freq: Every day | ORAL | 2 refills | Status: DC

## 2018-11-06 ENCOUNTER — Encounter: Payer: Self-pay | Admitting: Family Medicine

## 2018-11-06 ENCOUNTER — Ambulatory Visit
Admission: RE | Admit: 2018-11-06 | Discharge: 2018-11-06 | Disposition: A | Discharge status: Home / Self Care | Payer: BC Managed Care – PPO | Source: Ambulatory Visit | Attending: Family Medicine | Admitting: Family Medicine

## 2018-11-06 ENCOUNTER — Other Ambulatory Visit: Payer: Self-pay

## 2018-11-06 DIAGNOSIS — E278 Other specified disorders of adrenal gland: Secondary | ICD-10-CM | POA: Diagnosis not present

## 2018-11-06 IMAGING — MR MRI ABDOMEN WITHOUT CONTRAST
12 series · 48 of 48 positions shown · non-contrast
Comparison: Multiple exams, including [DATE] and [DATE]

CLINICAL DATA: Adrenal mass assessment.

EXAM:
MRI ABDOMEN WITHOUT CONTRAST
TECHNIQUE: Multiplanar multisequence MR imaging was performed without the
administration of intravenous contrast.

[Series 2: cor haste · coronal · 6.0mm · 1.19mm/px · 3 of 30 slices shown]
[im 1/30]
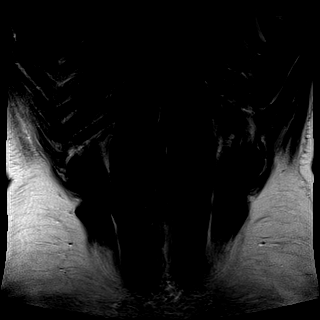
[im 15/30]
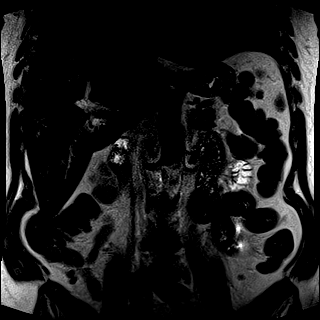
[im 30/30]
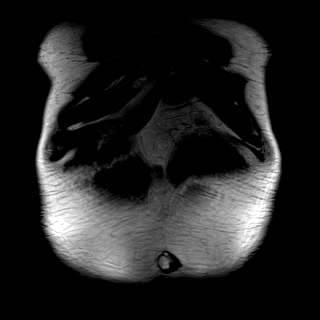

[Series 3: ax haste · axial · 6.0mm · 1.19mm/px · z∈[-63,+160]mm · 4 of 32 slices shown]
[im 1/32]
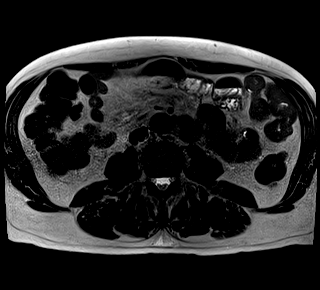
[im 11/32]
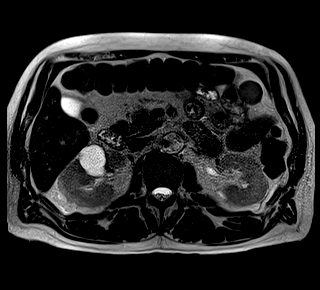
[im 21/32]
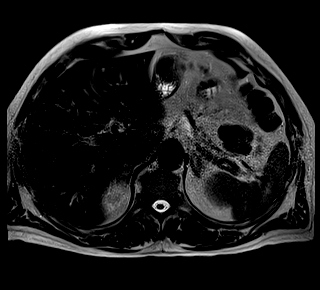
[im 32/32]
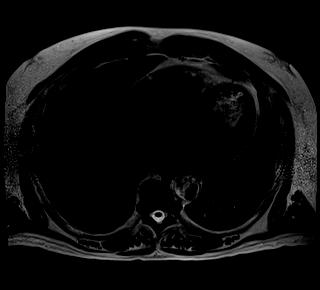

[Series 6: T2 fat-sat · axial · 6.0mm · 1.19mm/px · z∈[-60,+156]mm · 3 of 31 slices shown]
[im 1/31]
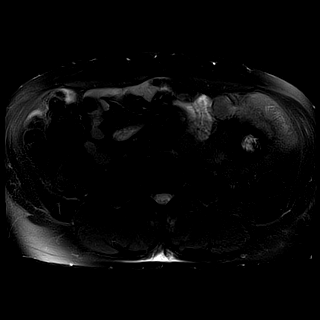
[im 16/31]
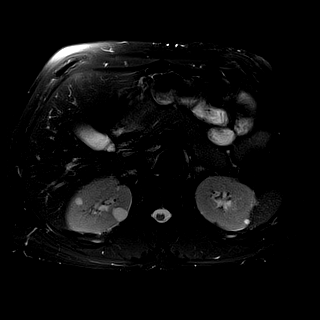
[im 31/31]
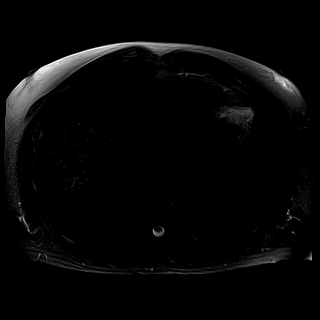

[Series 7: DWI · axial · 6.0mm · 1.42mm/px · z∈[-60,+156]mm · 2 of 31 slices shown (1 of 4)]
[im 1/31]
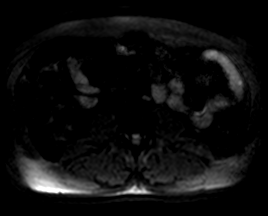
[im 31/31]
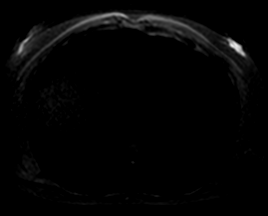

[Series 7: DWI · axial · 6.0mm · 1.42mm/px · z∈[-60,+156]mm · 2 of 31 slices shown (2 of 4)]
[im 1/31]
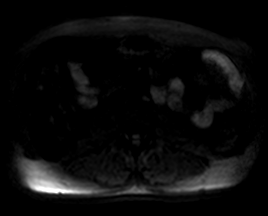
[im 31/31]
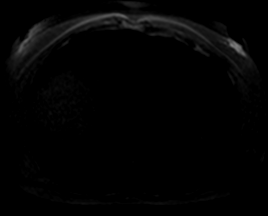

[Series 7: DWI · axial · 6.0mm · 1.42mm/px · z∈[-60,+156]mm · 2 of 31 slices shown (3 of 4)]
[im 1/31]
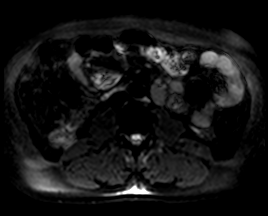
[im 31/31]
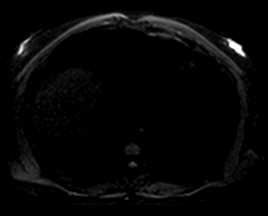

[Series 8: DWI · axial · 6.0mm · 1.42mm/px · z∈[-60,+156]mm · 2 of 31 slices shown (4 of 4)]
[im 1/31]
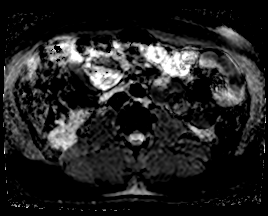
[im 31/31]
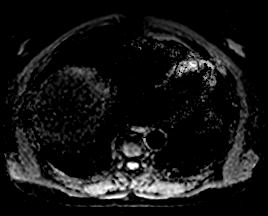

[Series 9: axial in-opp · axial · 3.0mm · 1.19mm/px · z∈[-65,+172]mm · 6 of 80 slices shown (1 of 2)]
[im 1/80]
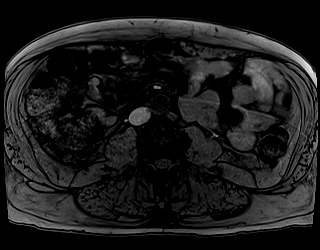
[im 16/80]
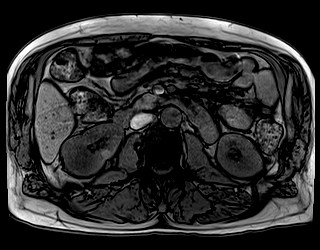
[im 32/80]
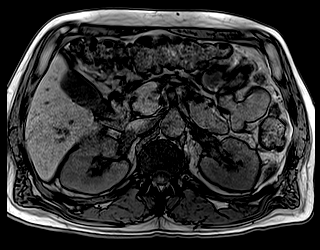
[im 48/80]
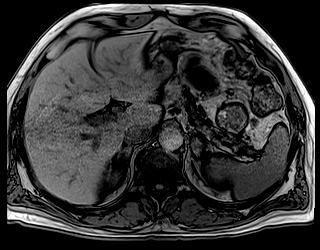
[im 64/80]
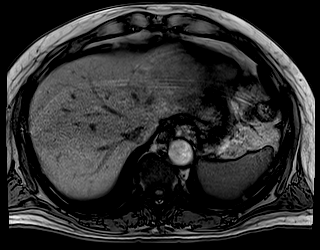
[im 80/80]
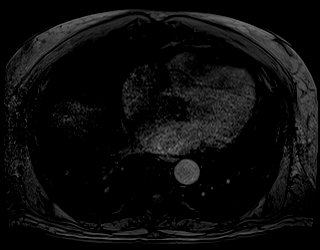

[Series 9: axial in-opp · axial · 3.0mm · 1.19mm/px · z∈[-65,+172]mm · 6 of 80 slices shown (2 of 2)]
[im 1/80]
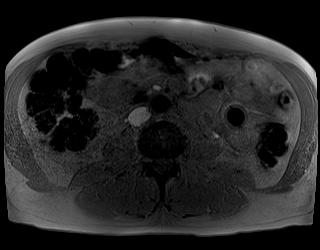
[im 16/80]
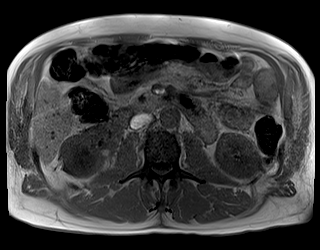
[im 32/80]
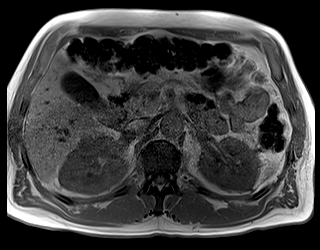
[im 48/80]
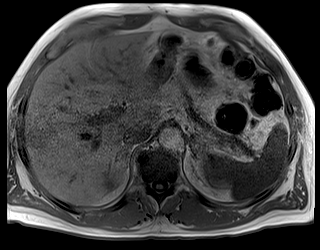
[im 64/80]
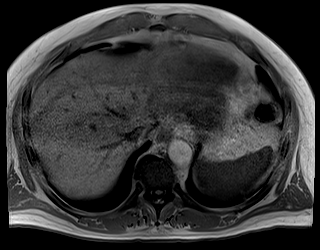
[im 80/80]
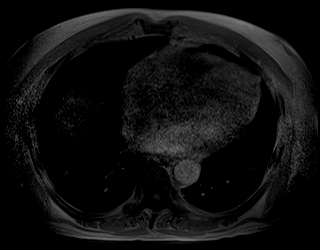

[Series 11: t1_[id number]_pre · axial · 3.0mm · 1.19mm/px · z∈[-65,+172]mm · 6 of 80 slices shown]
[im 1/80]
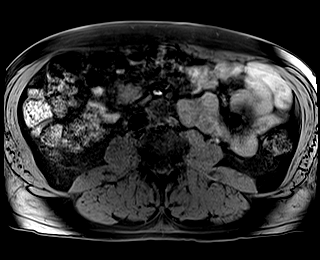
[im 16/80]
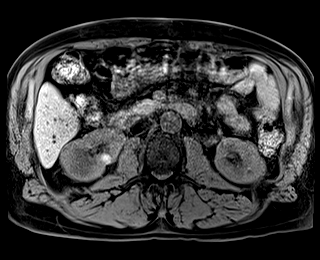
[im 32/80]
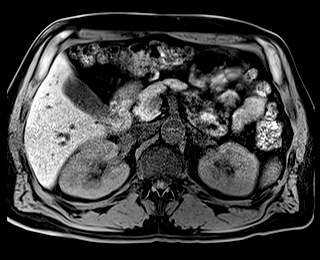
[im 48/80]
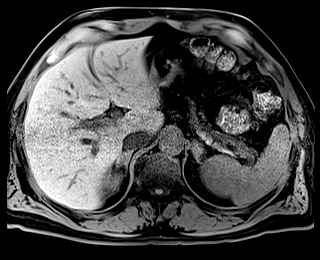
[im 64/80]
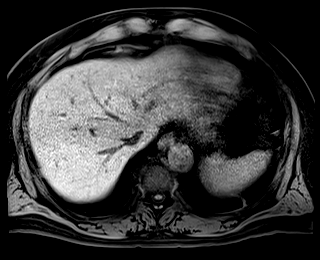
[im 80/80]
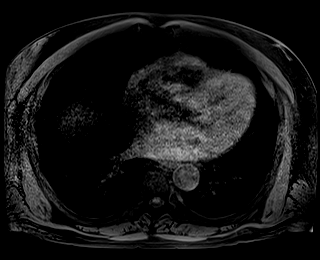

[Series 12: cor in-opp · coronal · 3.0mm · 1.56mm/px · 6 of 72 slices shown (1 of 2)]
[im 1/72]
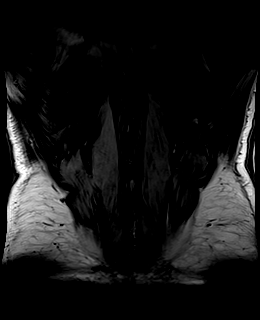
[im 15/72]
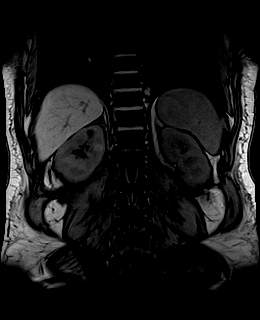
[im 29/72]
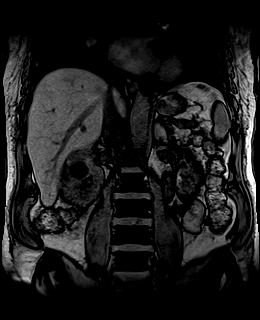
[im 43/72]
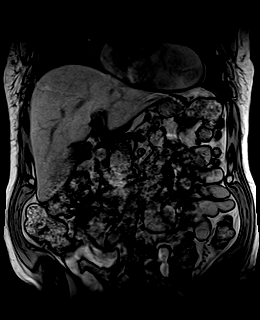
[im 57/72]
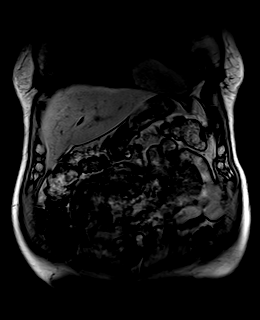
[im 72/72]
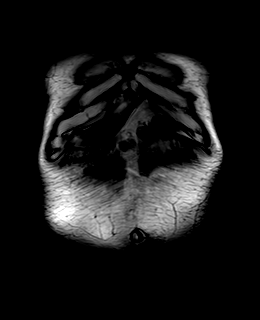

[Series 12: cor in-opp · coronal · 3.0mm · 1.56mm/px · 6 of 72 slices shown (2 of 2)]
[im 1/72]
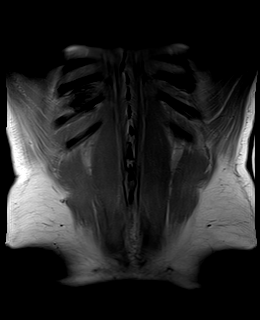
[im 15/72]
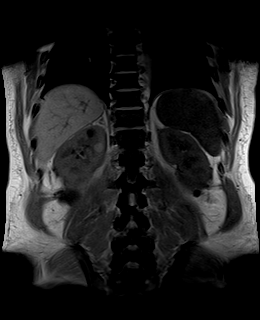
[im 29/72]
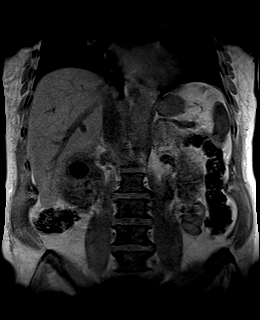
[im 43/72]
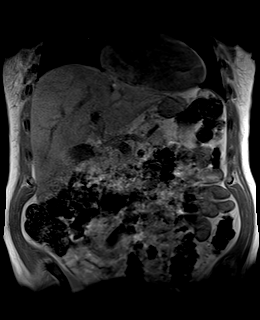
[im 57/72]
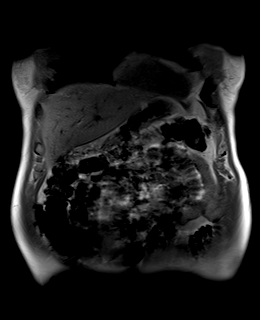
[im 72/72]
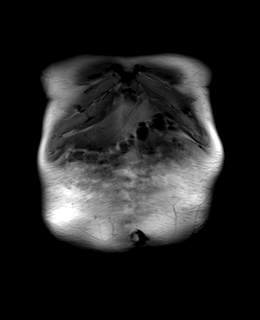

[48 of 48 positions shown; findings below may reference images not displayed]

FINDINGS: Lower chest: Unremarkable

Hepatobiliary: No significant hepatic lesion. Gallbladder
unremarkable.

Pancreas:  Unremarkable

Spleen:  Unremarkable

Adrenals/Urinary Tract: Right adrenal mass 1.4 by 2.9 cm on image
32/11, formerly 4.0 by 1.9 cm, with slightly improved definition of
the limbs of the adrenal gland on today's exam compared to previous.
The right adrenal gland has intermediate T1 and intermediate T2
signal characteristics, and was previously slightly hyperintense on
T1 weighted images and on T2 weighted images.

The left adrenal mass measures 3.1 by 2.0 cm on image 37/11,
formerly 5.5 by 2.6 cm. Currently the left adrenal lesion is
minimally accentuated in T1 signal compared to the adjacent kidney,
and demonstrates faintly increased T2 signal especially centrally.
Previously the lesion head peripheral T1 hyperintensity and central
T1 I so 2 hypointensity, and diffuse high T2 signal.

Bilateral T2 hyperintense renal lesions are present. The right
kidney lower pole lesion on image [DATE] measures 3.9 by 4.0 cm and
has at least 1 internal septation. There is likely a small septation
laterally in the 3.1 by 2.8 cm right mid kidney lesion on image
[DATE].

There are 3 indeterminate lesions of the right kidney which
demonstrate abnormal high precontrast T1 signal. One of these
measures 1.7 by 1.5 cm on image 44/11 (stable). No other measures
0.7 by 0.6 cm on image 53/11 (stable). The third is in the lower
pole and measures 1.5 by 1.2 cm on image 63/11 (stable). These
lesions all have high T2 signal characteristics.

Stomach/Bowel: Descending and sigmoid colon diverticulosis with
scattered diverticula elsewhere in the colon.

Vascular/Lymphatic: No adenopathy identified. Aortoiliac
atherosclerotic vascular disease.

Other:  No supplemental non-categorized findings.

Musculoskeletal: Unremarkable
IMPRESSION: 1. Complex bilateral adrenal masslike appearance, with significantly
reduced size compared to prior exams. These are not shown to
specifically represent adenomas and are nonspecific from a
noncontrast MR imaging standpoint. However, the reduction in volume
over the last month strongly favors a transient process such as
resolving adrenal hemorrhages. If the patient has known malignancy
under treatment than shrinking metastatic lesions might be a
consideration. The lesions have not completely resolved, and I would
suggest a follow up noncontrast CT abdomen in 6 months time to
assess the size of the adrenal glands once more.
2. Three complex lesions of the right kidney are present and are
unchanged from last month. These are probably small hemorrhagic
cysts but the enhancement characteristics are not interrogated on
today's noncontrast assessment.
3. Colonic diverticulosis.
4.  Aortic Atherosclerosis ([DD]-[DD]).

## 2018-11-09 ENCOUNTER — Other Ambulatory Visit: Payer: Self-pay

## 2018-11-09 ENCOUNTER — Ambulatory Visit: Payer: BC Managed Care – PPO | Attending: Infectious Diseases | Admitting: Infectious Diseases

## 2018-11-09 ENCOUNTER — Encounter: Payer: Self-pay | Admitting: Infectious Diseases

## 2018-11-09 VITALS — BP 114/81 | HR 97 | Temp 97.9°F | Ht 76.0 in

## 2018-11-09 DIAGNOSIS — Z87891 Personal history of nicotine dependence: Secondary | ICD-10-CM

## 2018-11-09 DIAGNOSIS — Z86718 Personal history of other venous thrombosis and embolism: Secondary | ICD-10-CM

## 2018-11-09 DIAGNOSIS — D7582 Heparin induced thrombocytopenia (HIT): Secondary | ICD-10-CM

## 2018-11-09 DIAGNOSIS — Z7901 Long term (current) use of anticoagulants: Secondary | ICD-10-CM

## 2018-11-09 DIAGNOSIS — D649 Anemia, unspecified: Secondary | ICD-10-CM

## 2018-11-09 DIAGNOSIS — E2749 Other adrenocortical insufficiency: Secondary | ICD-10-CM

## 2018-11-09 DIAGNOSIS — T8450XD Infection and inflammatory reaction due to unspecified internal joint prosthesis, subsequent encounter: Secondary | ICD-10-CM

## 2018-11-09 DIAGNOSIS — I8393 Asymptomatic varicose veins of bilateral lower extremities: Secondary | ICD-10-CM

## 2018-11-09 DIAGNOSIS — Z888 Allergy status to other drugs, medicaments and biological substances status: Secondary | ICD-10-CM

## 2018-11-09 DIAGNOSIS — J449 Chronic obstructive pulmonary disease, unspecified: Secondary | ICD-10-CM

## 2018-11-09 DIAGNOSIS — E271 Primary adrenocortical insufficiency: Secondary | ICD-10-CM | POA: Diagnosis not present

## 2018-11-09 DIAGNOSIS — E119 Type 2 diabetes mellitus without complications: Secondary | ICD-10-CM

## 2018-11-09 DIAGNOSIS — Z96653 Presence of artificial knee joint, bilateral: Secondary | ICD-10-CM

## 2018-11-09 LAB — ACID FAST CULTURE WITH REFLEXED SENSITIVITIES (MYCOBACTERIA): Acid Fast Culture: NEGATIVE

## 2018-11-09 NOTE — Progress Notes (Signed)
NAME: Isaac Cross  DOB: 1960-12-13  MRN: [id number]  Date/Time: 11/09/2018 9:04 AM  Subjective:   ?Follow-up visit after recent hospitalization Isaac Cross is a 58 y.o. male with a history of diabetes mellitus, COPD was recently at [facility] for nearly a month.  He was discharged home on oral antibiotics and is here to see me following that. Patient had a complicated medical course during his recent hospitalization between 09/12/2018- 10/12/2018. Patient underwent bilateral total knee arthroplasty on 09/01/2018 by Dr. Harlow Mares and postop he was on Xarelto.  He was doing fine initially until a few days later he became very fatigued and tired and then started to have a fever and had a fall and was brought to the hospital on 09/12/2018.  Prior to his hospitalization he was on Bactrim and Keflex for 24 hours. Patient was found to be febrile with hypotension and was initially admitted to the ICU.  There was no source identified initially.  His urine was clear, blood cultures were negative.  Both his knees were swollen along with the legs but the surgical site was without any erythema or dehiscence of the scar or discharge.  As there was no source identified the left knee was initially aspirated by Dr. Harlow Mares and it showed 8000 WBCs.  The culture was negative.  The right knee was also aspirated and that had 2000 WBCs.  And culture was negative.  As patient continued to have fever and hypotension in spite of vancomycin and cefepime he was taken for washout of the left knee on 09/17/2018 and the right knee on 09/18/2018.  After the procedure patient started to get worse with higher temperature and remained hypotensive.  He was also having acute kidney injury.  On 09/20/2018 he was diagnosed with a left DVT and was started on IV heparin.  On the same day he was also started on anidulafungin and antifungal and was started on stress dose steroids 50 mg every 6hrs.  A day into this treatment his temperatures resolved and his  blood pressure improved and he came off the pressors.  He had to go on dialysis for a short period of time.  His mental status also fluctuated when he was in the hospital and he was delirious and confused and there was a question of whether that he had DT or it was encephalopathy due to his kidney injury.  He was on IV steroids between 6/3-6/8.  IV heparin caused him to have thrombocytopenia and he was diagnosed with HIT.  Heparin was switched to argatroban.  After he was transferred to the floor on 6 09/26/2018 he was doing well until 3 days later he started developing fever and hypotension.  He also started to get confused.  Blood culture, urine culture remained negative again.  A.m. cortisol level was done and it was low at 4.58.  He had a cosyntropin test done that was also very low at 5.  Had an Carbon which was very high.  As part of work-up for fever he had a CT abdomen which showed bilateral adrenal mass.  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  Once he was put on cortisol and fludrocortisone his condition improved.  He completed 28 days of IV antibiotics and on discharge was put on doxycycline and Levaquin for10 more days.  After discharge he has seen endocrinologist Dr. Honor Junes. He had a repeat MRI of the abdomen without  contrast on 11/06/2018 and that revealed Decreasing adrenal masses on both sides.  The right adrenal mass measured 1.4 into 2.9 cm and it was formerly 4.0 in 2 1.9 cm.  The left adrenal mass was 3.1 x 2.0 cm and formally it was 5.5 x 2.6 cm. It was thought that the reduction in volume over the last month strongly favored transient process such as resolving adrenal hemorrhage. Patient has been doing well.  He is getting physical therapy at home.  He uses a cane.  His legs are still swollen but he has underlying varicose veins and he needs to wear compression stocking.  He is not had  any fever.  He does have a little dizziness when he stands up.  He has been eating well.  He has not had any nausea vomiting or abdominal pain.  Past Medical History:  Diagnosis Date   Allergy    Anxiety    Arthritis    knees   Asthma, mild intermittent, well-controlled    Chronic obstructive asthma (HCC)    COPD (chronic obstructive pulmonary disease) (HCC)    Depression with anxiety    Diabetes (HCC)    GERD (gastroesophageal reflux disease)    HBP (high blood pressure)    High cholesterol    Renal cyst, right    Sleep apnea    CPAP   Swelling     Past Surgical History:  Procedure Laterality Date   BLADDER REPAIR     COLONOSCOPY WITH PROPOFOL N/A 04/07/2015   Procedure: COLONOSCOPY WITH PROPOFOL;  Surgeon: Lucilla Lame, MD;  Location: [place];  Service: Endoscopy;  Laterality: N/A;  Diabetic - oral meds CPAP   HAND SURGERY     4TH AND 5TH FINGER   I&D KNEE WITH POLY EXCHANGE Right 09/18/2018   Procedure: IRRIGATION AND DEBRIDEMENT KNEE WITH POLY EXCHANGE;  Surgeon: Lovell Sheehan, MD;  Location: [facility];  Service: Orthopedics;  Laterality: Right;   KNEE ARTHROSCOPY Right    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   TOTAL KNEE ARTHROPLASTY Left 09/17/2018   Procedure: I &D left knee and polyethylene exchange;  Surgeon: Lovell Sheehan, MD;  Location: [facility];  Service: Orthopedics;  Laterality: Left;   VEIN SURGERY Right     Social History   Socioeconomic History   Marital status: Married    Spouse name: Not on file   Number of children: 2   Years of education: Not on file   Highest education level: Bachelor's degree (e.g., BA, AB, BS)  Occupational History   Occupation: case Engineer, maintenance strain: Not hard at all   Food insecurity    Worry: Never true    Inability: Never true   Transportation needs    Medical: No    Non-medical: No  Tobacco Use   Smoking status: Former Smoker     Packs/day: 1.00    Years: 10.00    Pack years: 10.00    Types: Cigarettes    Start date: 04/20/1995    Quit date: 04/19/2005    Years since quitting: 13.5   Smokeless tobacco: Never Used  Substance and Sexual Activity   Alcohol use: Yes    Alcohol/week: 10.0 standard drinks    Types: 10 Cans of beer per week    Comment: occasional   Drug use: No   Sexual activity: Yes    Partners: Female  Lifestyle   Physical activity    Days per week:  0 days    Minutes per session: 0 min   Stress: Not at all  Relationships   Social connections    Talks on phone: More than three times a week    Gets together: Once a week    Attends religious service: Never    Active member of club or organization: No    Attends meetings of clubs or organizations: Never    Relationship status: Married   Intimate partner violence    Fear of current or ex partner: No    Emotionally abused: No    Physically abused: No    Forced sexual activity: No  Other Topics Concern   Not on file  Social History Narrative   Lives at home with wife, using a walker since his surgery recently.    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 Current Outpatient Medications  Medication Sig Dispense Refill   acetaminophen (TYLENOL) 500 MG tablet Take 1 tablet (500 mg total) by mouth every 6 (six) hours as needed. 90 tablet 0   acidophilus (RISAQUAD) CAPS capsule Take 1 capsule by mouth  daily. 30 capsule 0   albuterol (PROVENTIL HFA;VENTOLIN HFA) 108 (90 Base) MCG/ACT inhaler INHALE 2 PUFFS INTO THE LUNGS 4 (FOUR) TIMES DAILY. 6.7 Inhaler 0   cetirizine (ZYRTEC) 10 MG tablet Take 10 mg by mouth daily.     citalopram (CELEXA) 20 MG tablet Take 1 tablet (20 mg total) by mouth daily. 90 tablet 1   diclofenac sodium (VOLTAREN) 1 % GEL APPLY 4 GRAMS TOPICALLY FOUR TIMES A DAY (Patient taking differently: Apply 4 g topically 4 (four) times daily. ) 100 g 2   doxycycline (VIBRA-TABS) 100 MG tablet Take 1 tablet (100 mg total) by mouth every 12 (twelve) hours. 24 tablet 0   ferrous sulfate 325 (65 FE) MG EC tablet Take 1 tablet (325 mg total) by mouth 3 (three) times daily with meals. 90 tablet 2   fludrocortisone (FLORINEF) 0.1 MG tablet Take 1 tablet (0.1 mg total) by mouth 2 (two) times daily. 60 tablet 0   fluticasone furoate-vilanterol (BREO ELLIPTA) 100-25 MCG/INH AEPB Inhale 1 puff into the lungs daily. 180 each 1   HYDROcodone-acetaminophen (NORCO/VICODIN) 5-325 MG tablet Take 1-2 tablets by mouth every 6 (six) hours as needed for moderate pain. 20 tablet 0   hydrocortisone (CORTEF) 20 MG tablet Take 1 tablet (20 mg total) by mouth 2 (two) times daily. 60 tablet 0   lidocaine (XYLOCAINE) 2 % solution Use as directed 15 mLs in the mouth or throat as needed for mouth pain. 100 mL 0   Multiple Vitamins-Minerals (MENS MULTIVITAMIN PLUS) TABS Take by mouth.     Omega-3 Fatty Acids (FISH OIL) 1000 MG CAPS Take by mouth daily.     rivaroxaban (XARELTO) 20 MG  TABS tablet Take 1 tablet (20 mg total) by mouth daily with supper. In place of warfarin 30 tablet 2   SENNA-PLUS 8.6-50 MG tablet Take 2 tablets by mouth every morning.     tamsulosin (FLOMAX) 0.4 MG CAPS capsule Take 1 capsule (0.4 mg total) by mouth daily. 90 capsule 0   triamcinolone cream (KENALOG) 0.1 % Apply 1 application topically 2 (two) times daily as needed (skin irritations).      No current  facility-administered medications for this visit.      Abtx:  Anti-infectives (From admission, onward)   None      REVIEW OF SYSTEMS:  Const: negative fever, negative chills, negative weight loss Eyes: negative diplopia or visual changes, negative eye pain ENT: negative coryza, negative sore throat Resp: negative cough, hemoptysis, dyspnea Cards: negative for chest pain, palpitations, lower extremity edema GU: negative for frequency, dysuria and hematuria GI: Negative for abdominal pain, diarrhea, bleeding, constipation Skin: negative for rash and pruritus Heme: negative for easy bruising and gum/nose bleeding MS: General weakness Edema legs Neurolo:negative for headaches, dizziness, vertigo, memory problems  Psych: negative for feelings of anxiety, depression  Endocrine: Has diabetes Allergy/Immunology- negative for any medication or food allergies ? Objective:  VITALS:  BP 114/81 (BP Location: Right Arm, Patient Position: Sitting, Cuff Size: Normal)    Pulse 97    Temp 97.9 F (36.6 C) (Oral)    Ht 6\' 4"  (1.93 m)    BMI 34.52 kg/m  PHYSICAL EXAM:   General: Alert, cooperative, no distress, appears stated age.  In wheelchair Head: Normocephalic, without obvious abnormality, atraumatic. Eyes: Conjunctivae clear, anicteric sclerae. Pupils are equal ENT did not examine because of mask neck: Supple, symmetrical, no adenopathy, thyroid: non tender no carotid bruit and no JVD. Back: No CVA tenderness. Lungs: Clear to auscultation bilaterally. No Wheezing or Rhonchi. No rales. Heart: Regular rate and rhythm, no murmur, rub or gallop. Abdomen: Did not examine Extremities: Bilateral edema legs, bilateral varicose veins, bilateral knee scars have healed very well.  There is no erythema or tenderness or discharge. Skin: No rashes or lesions. Or bruising Lymph: Cervical, supraclavicular normal. Neurologic: Grossly non-focal Pertinent Labs Lab Results CBC CBC Latest Ref Rng &  Units 10/20/2018 10/12/2018 10/11/2018  WBC 4.0 - 10.5 K/uL 9.5 7.5 7.6  Hemoglobin 13.0 - 17.0 g/dL 7.6(L) 7.4(L) 7.0(L)  Hematocrit 39.0 - 52.0 % 23.8(L) 23.9(L) 22.5(L)  Platelets 150 - 400 K/uL 535(H) 491(H) 377    CMP Latest Ref Rng & Units 10/20/2018 10/12/2018 10/11/2018  Glucose 70 - 99 mg/dL 125(H) 134(H) 169(H)  BUN 6 - 20 mg/dL 15 22(H) 26(H)  Creatinine 0.61 - 1.24 mg/dL 1.13 1.71(H) 1.93(H)  Sodium 135 - 145 mmol/L 137 138 136  Potassium 3.5 - 5.1 mmol/L 3.8 3.6 3.7  Chloride 98 - 111 mmol/L 106 105 103  CO2 22 - 32 mmol/L 19(L) 23 24  Calcium 8.9 - 10.3 mg/dL 8.8(L) 8.6(L) 8.2(L)  Total Protein 6.5 - 8.1 g/dL 7.4 - -  Total Bilirubin 0.3 - 1.2 mg/dL 0.3 - -  Alkaline Phos 38 - 126 U/L 45 - -  AST 15 - 41 U/L 28 - -  ALT 0 - 44 U/L 18 - -     IMAGING RESULTS: 11/06/18  10/04/18  I have personally reviewed the films  The right adrenal mass measured 1.4 into 2.9 cm and it was formerly 4.0 in 2 1.9 cm.  The left adrenal mass was 3.1 x 2.0 cm and formally it was 5.5 x 2.6 cm.  ? Impression/Recommendation ? ?58 year old male status post bilateral TKA was admitted to [facility] between 09/12/2018 until 10/12/2018 for fever and hypotension. Initially was treated like sepsis secondary to the left knee even though blood culture, synovial fluid culture, synovial tissue culture were all negative and 16 sRNA next generation sequencing sent of the left knee tissue was also negative.  The patient did not respond to antibiotics vancomycin and cefepime nor to washout of the knees and in fact got worse and started to get better 1 day after getting stress dose steroids in the ICU .  once the steroids were withdrawn patient started to develop fever and hypotension 72 hours after that.  That led [place] to investigate him for adrenal insufficiency .  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  He was on Xarelto  before he came to the hospital. The question is did he have sepsis that caused the adrenal hemorrhage or was it all along adrenal crisis due to bilateral adrenal hemorrhage from Xarelto that mimicked sepsis.  On retrospect I favor the the latter because the cultures were all negative , he did not respond to antibiotics, as I know we are fluid did not have impressive cell count, 16 sRNA next generation sequencing of the synovial tissue of the left knee was also negative and once the steroids were withdrawn patient started having fever and hypotension again.  This again resolved with introduction of steroids.  Repeat MRI done on 11/06/2018 shows reduction in the size of the bilateral adrenal hematomas.  Patient is going to need lifelong hydrocortisone and will need a medic alert bracelet and need to increase his hydrocortisone during.'s of stress. He is followed by endocrinologist.  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  Status post 6 weeks of treatment.  I doubt he had an infection  Anemia  Left DVT currently on Xarelto  Bilateral varicose veins with venous edema.  Needs compression stocking   ___________________________________________________ Discussed with patient in great detail. I had also discussed with Dr. Honor Junes and Dr. Ancil Boozer and Dr.Bowers after his discharge from the hospital. Note:  This document was prepared using Dragon voice recognition software and may include unintentional dictation errors.

## 2018-11-09 NOTE — Patient Instructions (Signed)
You are here for follow up after recent hosipitalization when you were admitted for sepsis and the left knee was questioned to be a source but you never had bacteria growing in any culture beit the knee, or blood or urine. You were also diagnosed with acute adrenal crisis due to b/l adrenal hemorrhage. The question is were all your symptoms and the presentation like sepsis was due to the adrenal hemorrhage which likely would have been caused by xarelto that you took following b/l knee surery or was there an infection/sepsis that precipitated the adrenl crisis- I think the former is highly likely - you have completed your course of antibiotics and you are fine now. You need to take hydrocortisone /mineralocroticoid for the rest of your life and if you have any stressful event you will have to increase the dose- Your endocrine doctor would have explained that to you. Please follow up with Dr.O Dina Rich and your PCP

## 2018-11-14 ENCOUNTER — Ambulatory Visit: Payer: BC Managed Care – PPO | Admitting: Family Medicine

## 2018-11-14 ENCOUNTER — Other Ambulatory Visit: Payer: Self-pay

## 2018-11-14 ENCOUNTER — Encounter: Payer: Self-pay | Admitting: Family Medicine

## 2018-11-14 VITALS — BP 100/66 | HR 118 | Temp 97.3°F | Resp 16 | Ht 76.0 in | Wt 282.8 lb

## 2018-11-14 DIAGNOSIS — F418 Other specified anxiety disorders: Secondary | ICD-10-CM

## 2018-11-14 DIAGNOSIS — E1142 Type 2 diabetes mellitus with diabetic polyneuropathy: Secondary | ICD-10-CM

## 2018-11-14 DIAGNOSIS — J449 Chronic obstructive pulmonary disease, unspecified: Secondary | ICD-10-CM | POA: Diagnosis not present

## 2018-11-14 DIAGNOSIS — I10 Essential (primary) hypertension: Secondary | ICD-10-CM

## 2018-11-14 DIAGNOSIS — G4733 Obstructive sleep apnea (adult) (pediatric): Secondary | ICD-10-CM

## 2018-11-14 DIAGNOSIS — I82A12 Acute embolism and thrombosis of left axillary vein: Secondary | ICD-10-CM

## 2018-11-14 DIAGNOSIS — Z96653 Presence of artificial knee joint, bilateral: Secondary | ICD-10-CM | POA: Diagnosis not present

## 2018-11-14 MED ORDER — BREO ELLIPTA 100-25 MCG/INH IN AEPB: 1.0000 | INHALATION_SPRAY | Freq: Every day | RESPIRATORY_TRACT | 1 refills | Status: DC

## 2018-11-14 MED ORDER — CITALOPRAM HYDROBROMIDE 20 MG PO TABS: 20.0000 mg | ORAL_TABLET | Freq: Every day | ORAL | 1 refills | Status: DC

## 2018-11-14 NOTE — Progress Notes (Signed)
Name: Isaac Cross   MRN: [id number]    DOB: 1960-11-06   Date:11/14/2018       Progress Note  Subjective  Chief Complaint  Chief Complaint  Patient presents with  . Asthma    HPI  S/P bilateral total knee Replacements:  It was done by  Dr. Harlow Mares on May 15th, 2020 in [place]. He is finally doing well, only using a cane when out of the house, still getting PT twice a week, rom has improved and is up to 120 degrees on the right and slightly under 110 on the left . Pain has improved, only taking Tylenol prn, off hydrocodone  Urinary frequency: he is taking flomax since surgery, nocturia twice per night, but stable   COPD/Asthma:usually mild and controlled, using Breo and denies cough, SOB or wheezing, doing well.   OSA: compliant with CPAP machine, he denies waking up with headaches, snoring under control with CPAP, still compliant He had a sore on nose that developed while in the hospital because they did not apply padding, he has a healing ulcer on the bridge of his nose, advised Mederma   DMII: Taking Metformin 1000 mg twice daily, he is taking iron pills and no longer has diarrhea. He lost 30 lbs since he was admitted for knee surgery, glucose at home has been well controlled 90's-120's fasting. No polyphagia, polydipsia but has polyuria from drinking so many fluids.   HTN: taking lisinopril 40 mg daily, bp is low, we will cut down to 20 mg daily and recheck next month . It was not on his active medication list, not sure when it was stopped   DVT: after knee surgery, switched to coumadin because of acute renal failure, but since discharged, kidney function is back to normal, and tolerating Xarelto    Patient Active Problem List   Diagnosis Date Noted  . Acute renal failure ([place])   . History of total knee arthroplasty 09/04/2018  . Coronary atherosclerosis due to calcified coronary lesion 12/02/2017  . Atherosclerosis of abdominal aorta ([place]) 06/17/2015  . Fatty liver  disease, nonalcoholic 65/06/5463  . Renal cyst, right 06/17/2015  . BPH (benign prostatic hyperplasia) 05/25/2015  . Microscopic hematuria 05/25/2015  . Second degree hemorrhoids   . Diverticulosis of large intestine without diverticulitis   . Asthma, mild intermittent, well-controlled 10/16/2014  . Allergic rhinitis 10/13/2014  . ED (erectile dysfunction) of organic origin 07/22/2014  . Gastro-esophageal reflux disease without esophagitis 07/22/2014  . Benign hypertension 07/22/2014  . 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[place]) 07/22/2014  . Adult BMI 30+ 07/22/2014  . Chronic obstructive asthma ([place]) 07/22/2014  . Depression with anxiety 07/22/2014  . Dyslipidemia 07/22/2014  . Genital herpes 07/22/2014  . 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 Osteoarthritis of both knees 07/22/2014  . Obstructive apnea 05/14/2013  . 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 Blood in the urine 09/20/2006    Past Surgical History:  Procedure Laterality Date  . BLADDER REPAIR    . COLONOSCOPY WITH PROPOFOL N/A 04/07/2015   Procedure: COLONOSCOPY WITH PROPOFOL;  Surgeon: Lucilla Lame, MD;  Location: [place];  Service: Endoscopy;  Laterality: N/A;  Diabetic - oral meds CPAP  . HAND SURGERY     4TH AND 5TH FINGER  . I&D KNEE WITH POLY EXCHANGE Right 09/18/2018   Procedure: IRRIGATION AND DEBRIDEMENT KNEE WITH POLY EXCHANGE;  Surgeon: Lovell Sheehan, MD;  Location: [facility];  Service: Orthopedics;  Laterality: Right;  . KNEE ARTHROSCOPY  Right   . 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 TOTAL KNEE ARTHROPLASTY Left 09/17/2018   Procedure: I &D left knee and polyethylene exchange;  Surgeon: Lovell Sheehan, MD;  Location: [facility];  Service: Orthopedics;  Laterality: Left;  Marland Kitchen VEIN SURGERY Right     Family History  Problem Relation Age of Onset  . Hypertension Mother   . Diabetes Mother   . Diabetes Father   . Hypertension Father   . Cancer Father   .  Seizures Daughter   . Lupus Daughter   . Cancer Maternal Grandfather        Prostate  . Diabetes Paternal Grandfather   . Prostate cancer Maternal Uncle   . Bladder Cancer Neg Hx   . Kidney cancer Neg Hx     Social History   Socioeconomic History  . Marital status: Married    Spouse name: Not on file  . Number of children: 2  . Years of education: Not on file  . Highest education level: Bachelor's degree (e.g., BA, AB, BS)  Occupational History  . Occupation: case Freight forwarder   Social Needs  . Financial resource strain: Not hard at all  . Food insecurity    Worry: Never true    Inability: Never true  . Transportation needs    Medical: No    Non-medical: No  Tobacco Use  . Smoking status: Former Smoker    Packs/day: 1.00    Years: 10.00    Pack years: 10.00    Types: Cigarettes    Start date: 04/20/1995    Quit date: 04/19/2005    Years since quitting: 13.5  . Smokeless tobacco: Never Used  Substance and Sexual Activity  . Alcohol use: Yes    Alcohol/week: 10.0 standard drinks    Types: 10 Cans of beer per week    Comment: occasional  . Drug use: No  . Sexual activity: Yes    Partners: Female  Lifestyle  . Physical activity    Days per week: 0 days    Minutes per session: 0 min  . Stress: Not at all  Relationships  . Social connections    Talks on phone: More than three times a week    Gets together: Once a week    Attends religious service: Never    Active member of club or organization: No    Attends meetings of clubs or organizations: Never    Relationship status: Married  . Intimate partner violence    Fear of current or ex partner: No    Emotionally abused: No    Physically abused: No    Forced sexual activity: No  Other Topics Concern  . Not on file  Social History Narrative   Lives at home with wife, using a walker since his surgery recently.     Current Outpatient Medications:  .  acetaminophen (TYLENOL) 500 MG tablet, Take 1 tablet (500 mg  total) by mouth every 6 (six) hours as needed., Disp: 90 tablet, Rfl: 0 .  acidophilus (RISAQUAD) CAPS capsule, Take 1 capsule by mouth daily., Disp: 30 capsule, Rfl: 0 .  albuterol (PROVENTIL HFA;VENTOLIN HFA) 108 (90 Base) MCG/ACT inhaler, INHALE 2 PUFFS INTO THE LUNGS 4 (FOUR) TIMES DAILY., Disp: 6.7 Inhaler, Rfl: 0 .  cetirizine (ZYRTEC) 10 MG tablet, Take 10 mg by mouth daily., Disp: , Rfl:  .  citalopram (CELEXA) 20 MG tablet, Take 1 tablet (20 mg total) by mouth daily., Disp: 90 tablet, Rfl: 1 .  diclofenac sodium (VOLTAREN) 1 % GEL, APPLY 4 GRAMS TOPICALLY FOUR TIMES A DAY (Patient taking differently: Apply 4 g topically 4 (four) times daily. ), Disp: 100 g, Rfl: 2 .  ferrous sulfate 325 (65 FE) MG EC tablet, Take 1 tablet (325 mg total) by mouth 3 (three) times daily with meals., Disp: 90 tablet, Rfl: 2 .  fludrocortisone (FLORINEF) 0.1 MG tablet, Take 1 tablet (0.1 mg total) by mouth 2 (two) times daily., Disp: 60 tablet, Rfl: 0 .  fluticasone furoate-vilanterol (BREO ELLIPTA) 100-25 MCG/INH AEPB, Inhale 1 puff into the lungs daily., Disp: 180 each, Rfl: 1 .  hydrocortisone (CORTEF) 20 MG tablet, Take 1 tablet (20 mg total) by mouth 2 (two) times daily., Disp: 60 tablet, Rfl: 0 .  lisinopril (ZESTRIL) 40 MG tablet, Take 20 mg by mouth daily., Disp: , Rfl:  .  metFORMIN (GLUCOPHAGE) 1000 MG tablet, Take 1,000 mg by mouth 2 (two) times daily with a meal., Disp: , Rfl:  .  Multiple Vitamins-Minerals (MENS MULTIVITAMIN PLUS) TABS, Take by mouth., Disp: , Rfl:  .  Omega-3 Fatty Acids (FISH OIL) 1000 MG CAPS, Take by mouth daily., Disp: , Rfl:  .  rivaroxaban (XARELTO) 20 MG TABS tablet, Take 1 tablet (20 mg total) by mouth daily with supper. In place of warfarin, Disp: 30 tablet, Rfl: 2 .  SENNA-PLUS 8.6-50 MG tablet, Take 2 tablets by mouth every morning., Disp: , Rfl:  .  tamsulosin (FLOMAX) 0.4 MG CAPS capsule, Take 1 capsule (0.4 mg total) by mouth daily., Disp: 90 capsule, Rfl: 0 .   triamcinolone cream (KENALOG) 0.1 %, Apply 1 application topically 2 (two) times daily as needed (skin irritations). , Disp: , Rfl:   Allergies  Allergen Reactions  . 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 Lipitor [Atorvastatin] Hives and Itching  . Other   . Viagra  [Sildenafil Citrate]     vision loss    I personally reviewed active problem list, medication list, allergies, family history, social history with the patient/caregiver today.   ROS  Constitutional: Negative for fever, positive for  weight change.  Respiratory: Negative for cough and shortness of breath.   Cardiovascular: Negative for chest pain or palpitations.  Gastrointestinal: Negative for abdominal pain, no bowel changes.  Musculoskeletal: Positive  for gait problem no  joint swelling.  Skin: Negative for rash.  Neurological: Negative for dizziness or headache.  No other specific complaints in a complete review of systems (except as listed in HPI above).  Objective  Vitals:   11/14/18 1255  BP: 100/66  Pulse: (!) 118  Resp: 16  Temp: (!) 97.3 F (36.3 C)  TempSrc: Temporal  SpO2: 98%  Weight: 282 lb 12.8 oz (128.3 kg)  Height: 6\' 4"  (1.93 m)    Body mass index is 34.42 kg/m.  Physical Exam  Constitutional: Patient appears well-developed and well-nourished. He is a tall and big guy, No distress.  HEENT: head atraumatic, normocephalic, pupils equal and reactive to light,  neck supple Cardiovascular: Normal rate, regular rhythm and normal heart sounds.  No murmur heard. No BLE edema. Pulmonary/Chest: Effort normal and breath sounds normal. No respiratory distress. Abdominal: Soft.  There is no tenderness. Muscular Skeletal: he walked in with a cane Psychiatric: Patient has a normal mood and affect. behavior is normal. Judgment and thought content normal.  PHQ2/9: Depression screen [facility] 2/9 11/14/2018 10/23/2018 09/05/2018 03/06/2018 12/02/2017  Decreased Interest 0 0 0 0 0  Down, Depressed, Hopeless 0 0 0 0 0  PHQ - 2 Score 0 0 0 0 0  Altered sleeping 0 0 0 0 0  Tired, decreased energy 0 0 0 0 1  Change in appetite 0 0 0 0 0  Feeling bad or failure about yourself  0 0 0 0 0  Trouble concentrating 0 0 0 0 0  Moving slowly or fidgety/restless 0 0 0 0 0  Suicidal thoughts 0 0 0 0 0  PHQ-9 Score 0 0 0 0 1  Difficult doing work/chores - - Not difficult at all Not difficult at all Not difficult at all    phq 9 is negative   Fall Risk: Fall Risk  11/14/2018 10/23/2018 09/05/2018 06/07/2018 03/15/2018  Falls in the past year? 0 0 0 0 0  Number falls in past yr: 0 0 0 0 -  Injury with Fall? 0 0 0 0 -     Functional Status Survey: Is the patient deaf or have difficulty hearing?: No Does the patient have difficulty seeing, even when wearing glasses/contacts?: No Does the patient have difficulty concentrating, remembering, or making decisions?: No Does the patient have difficulty walking or climbing stairs?: Yes Does the patient have difficulty dressing or bathing?: No Does the patient have difficulty doing errands alone such as visiting a doctor's office or shopping?: No    Assessment & Plan   1. Depression with anxiety  - citalopram (CELEXA) 20 MG tablet; Take 1 tablet (20 mg total) by mouth daily.  Dispense: 90 tablet; Refill: 1  2. COPD with asthma ([place])  Doing well on medication  3. History of total bilateral knee replacement  Doing well   4. DVT of axillary vein, acute left (HCC)  Currently on Xarelto   5. Diabetic polyneuropathy associated with type 2 diabetes mellitus ([place])  Recheck A1C next visit   6. Obstructive apnea  Doing well on CPAP   7. Benign hypertension  He is taking lisinopril 40 mg we will decrease to 20 mg and monitor

## 2018-11-16 ENCOUNTER — Ambulatory Visit: Payer: BC Managed Care – PPO | Admitting: Family Medicine

## 2018-11-17 ENCOUNTER — Other Ambulatory Visit: Payer: Self-pay | Admitting: Family Medicine

## 2018-12-06 ENCOUNTER — Encounter: Payer: Self-pay | Admitting: Family Medicine

## 2018-12-06 ENCOUNTER — Other Ambulatory Visit: Payer: Self-pay

## 2018-12-06 ENCOUNTER — Ambulatory Visit: Payer: BC Managed Care – PPO | Admitting: Family Medicine

## 2018-12-06 VITALS — BP 110/60 | HR 94 | Temp 97.7°F | Resp 16 | Ht 76.0 in | Wt 287.4 lb

## 2018-12-06 DIAGNOSIS — J449 Chronic obstructive pulmonary disease, unspecified: Secondary | ICD-10-CM

## 2018-12-06 DIAGNOSIS — R809 Proteinuria, unspecified: Secondary | ICD-10-CM

## 2018-12-06 DIAGNOSIS — Z23 Encounter for immunization: Secondary | ICD-10-CM

## 2018-12-06 DIAGNOSIS — I10 Essential (primary) hypertension: Secondary | ICD-10-CM | POA: Diagnosis not present

## 2018-12-06 DIAGNOSIS — Z96653 Presence of artificial knee joint, bilateral: Secondary | ICD-10-CM

## 2018-12-06 DIAGNOSIS — I82A12 Acute embolism and thrombosis of left axillary vein: Secondary | ICD-10-CM | POA: Diagnosis not present

## 2018-12-06 DIAGNOSIS — D62 Acute posthemorrhagic anemia: Secondary | ICD-10-CM

## 2018-12-06 DIAGNOSIS — E785 Hyperlipidemia, unspecified: Secondary | ICD-10-CM

## 2018-12-06 DIAGNOSIS — E1129 Type 2 diabetes mellitus with other diabetic kidney complication: Secondary | ICD-10-CM

## 2018-12-06 DIAGNOSIS — E2740 Unspecified adrenocortical insufficiency: Secondary | ICD-10-CM

## 2018-12-06 DIAGNOSIS — I700 Atherosclerosis of aorta: Secondary | ICD-10-CM

## 2018-12-06 MED ORDER — LISINOPRIL 10 MG PO TABS: 10.0000 mg | ORAL_TABLET | Freq: Every day | ORAL | 1 refills | Status: DC

## 2018-12-06 MED ORDER — ALBUTEROL SULFATE HFA 108 (90 BASE) MCG/ACT IN AERS: 2.0000 | INHALATION_SPRAY | Freq: Four times a day (QID) | RESPIRATORY_TRACT | 0 refills | Status: DC

## 2018-12-06 NOTE — Progress Notes (Signed)
Name: Isaac Cross   MRN: [id number]    DOB: 1960-10-05   Date:12/06/2018       Progress Note  Subjective  Chief Complaint  Chief Complaint  Patient presents with  . Hypertension  . Asthma  . Diabetes    HPI  S/P bilateral total knee Replacements: It was done by Dr. Harlow Mares on May 15th,2020 in [facility] finally doing well, only using a cane when out of the house, finished PT,  rom is over 110 degrees on both knees  He started driving 3 weeks ago , he will go see Dr. Harlow Mares tomorrow and plans on going back to work this week.   Urinary frequency: he is taking flomax since surgery, nocturia down to about once per night now   COPD/Asthma:usually mild and controlled,using Breo and denies cough, SOB or wheezing, doing well. He would like to have another rescue inhaler at home, but not using very often  OSA: compliant with CPAP machine, he denies waking up with headaches, snoring under control with CPAP, still compliant  DMII: Taking Metformin 1000 mg twice daily, he is taking iron pills and no longer has diarrhea. He lost 30 lbs since he was admitted for knee surgery, but weight stable since,  glucose at home has been controlled and will have A1C done today . No polyphagia, polydipsia or polyuria at this time. We will recheck urine micro   HTN: we changed from lisinopril from 40 mg to 20 mg and bp is still low, we will decrease to 10 mg daily and monitor. He denies chest pain, palpitation or SOB at this time  DVT: after knee surgery, switched to coumadin because of acute renal failure, but since discharged, kidney function is back to normal, and tolerating Xarelto , no bleeding noted  Atherosclerosis abdominal aorta: taking statin and bp is good. Currently on xarelto  Adrenal insufficiency: size of adrenal glands improved still taking medication given by Dr. Honor Junes   Patient Active Problem List   Diagnosis Date Noted  . History of total knee arthroplasty 09/04/2018  .  Coronary atherosclerosis due to calcified coronary lesion 12/02/2017  . Atherosclerosis of abdominal aorta ([place]) 06/17/2015  . Fatty liver disease, nonalcoholic 41/32/4401  . Renal cyst, right 06/17/2015  . BPH (benign prostatic hyperplasia) 05/25/2015  . Microscopic hematuria 05/25/2015  . Second degree hemorrhoids   . Diverticulosis of large intestine without diverticulitis   . Asthma, mild intermittent, well-controlled 10/16/2014  . Allergic rhinitis 10/13/2014  . ED (erectile dysfunction) of organic origin 07/22/2014  . Gastro-esophageal reflux disease without esophagitis 07/22/2014  . Benign hypertension 07/22/2014  . 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[place]) 07/22/2014  . Adult BMI 30+ 07/22/2014  . Chronic obstructive asthma ([place]) 07/22/2014  . Depression with anxiety 07/22/2014  . Dyslipidemia 07/22/2014  . Genital herpes 07/22/2014  . 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 Osteoarthritis of both knees 07/22/2014  . Obstructive apnea 05/14/2013  . 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 Blood in the urine 09/20/2006    Past Surgical History:  Procedure Laterality Date  . BLADDER REPAIR    . COLONOSCOPY WITH PROPOFOL N/A 04/07/2015   Procedure: COLONOSCOPY WITH PROPOFOL;  Surgeon: Lucilla Lame, MD;  Location: [place];  Service: Endoscopy;  Laterality: N/A;  Diabetic - oral meds CPAP  . HAND SURGERY     4TH AND 5TH FINGER  . I&D KNEE WITH POLY EXCHANGE Right 09/18/2018   Procedure: IRRIGATION AND DEBRIDEMENT KNEE WITH POLY EXCHANGE;  Surgeon: Lovell Sheehan, MD;  Location: [facility];  Service: Orthopedics;  Laterality: Right;  . KNEE ARTHROSCOPY Right   . 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 TOTAL KNEE ARTHROPLASTY Left 09/17/2018   Procedure: I &D left knee and polyethylene exchange;  Surgeon: Lovell Sheehan, MD;  Location: [facility];  Service: Orthopedics;  Laterality: Left;  Marland Kitchen VEIN SURGERY Right     Family History   Problem Relation Age of Onset  . Hypertension Mother   . Diabetes Mother   . Diabetes Father   . Hypertension Father   . Cancer Father   . Seizures Daughter   . Lupus Daughter   . Cancer Maternal Grandfather        Prostate  . Diabetes Paternal Grandfather   . Prostate cancer Maternal Uncle   . Bladder Cancer Neg Hx   . Kidney cancer Neg Hx     Social History   Socioeconomic History  . Marital status: Married    Spouse name: Not on file  . Number of children: 2  . Years of education: Not on file  . Highest education level: Bachelor's degree (e.g., BA, AB, BS)  Occupational History  . Occupation: case Freight forwarder   Social Needs  . Financial resource strain: Not hard at all  . Food insecurity    Worry: Never true    Inability: Never true  . Transportation needs    Medical: No    Non-medical: No  Tobacco Use  . Smoking status: Former Smoker    Packs/day: 1.00    Years: 10.00    Pack years: 10.00    Types: Cigarettes    Start date: 04/20/1995    Quit date: 04/19/2005    Years since quitting: 13.6  . Smokeless tobacco: Never Used  Substance and Sexual Activity  . Alcohol use: Yes    Alcohol/week: 10.0 standard drinks    Types: 10 Cans of beer per week    Comment: occasional  . Drug use: No  . Sexual activity: Yes    Partners: Female  Lifestyle  . Physical activity    Days per week: 0 days    Minutes per session: 0 min  . Stress: Not at all  Relationships  . Social connections    Talks on phone: More than three times a week    Gets together: Once a week    Attends religious service: Never    Active member of club or organization: No    Attends meetings of clubs or organizations: Never    Relationship status: Married  . Intimate partner violence    Fear of current or ex partner: No    Emotionally abused: No    Physically abused: No    Forced sexual activity: No  Other Topics Concern  . Not on file  Social History Narrative   Lives at home with wife, using a  walker since his surgery recently.     Current Outpatient Medications:  .  acetaminophen (TYLENOL) 500 MG tablet, Take 1 tablet (500 mg total) by mouth every 6 (six) hours as needed., Disp: 90 tablet, Rfl: 0 .  acidophilus (RISAQUAD) CAPS capsule, Take 1 capsule by mouth daily., Disp: 30 capsule, Rfl: 0 .  albuterol (VENTOLIN HFA) 108 (90 Base) MCG/ACT inhaler, Inhale 2 puffs into the lungs 4 (four) times daily., Disp: 18 g, Rfl: 0 .  cetirizine (ZYRTEC) 10 MG tablet, Take 10 mg by mouth daily., Disp: , Rfl:  .  citalopram (  CELEXA) 20 MG tablet, Take 1 tablet (20 mg total) by mouth daily., Disp: 90 tablet, Rfl: 1 .  diclofenac sodium (VOLTAREN) 1 % GEL, APPLY 4 GRAMS TOPICALLY FOUR TIMES A DAY (Patient taking differently: Apply 4 g topically 4 (four) times daily. ), Disp: 100 g, Rfl: 2 .  ferrous sulfate 325 (65 FE) MG EC tablet, Take 1 tablet (325 mg total) by mouth 3 (three) times daily with meals., Disp: 90 tablet, Rfl: 2 .  fludrocortisone (FLORINEF) 0.1 MG tablet, Take 1 tablet (0.1 mg total) by mouth 2 (two) times daily., Disp: 60 tablet, Rfl: 0 .  fluticasone furoate-vilanterol (BREO ELLIPTA) 100-25 MCG/INH AEPB, Inhale 1 puff into the lungs daily., Disp: 180 each, Rfl: 1 .  hydrocortisone (CORTEF) 20 MG tablet, Take 1 tablet (20 mg total) by mouth 2 (two) times daily., Disp: 60 tablet, Rfl: 0 .  lisinopril (ZESTRIL) 10 MG tablet, Take 1 tablet (10 mg total) by mouth daily., Disp: 90 tablet, Rfl: 1 .  metFORMIN (GLUCOPHAGE) 1000 MG tablet, Take 1,000 mg by mouth 2 (two) times daily with a meal., Disp: , Rfl:  .  Multiple Vitamins-Minerals (MENS MULTIVITAMIN PLUS) TABS, Take by mouth., Disp: , Rfl:  .  Omega-3 Fatty Acids (FISH OIL) 1000 MG CAPS, Take by mouth daily., Disp: , Rfl:  .  rivaroxaban (XARELTO) 20 MG TABS tablet, Take 1 tablet (20 mg total) by mouth daily with supper. In place of warfarin, Disp: 30 tablet, Rfl: 2 .  SENNA-PLUS 8.6-50 MG tablet, Take 2 tablets by mouth every  morning., Disp: , Rfl:  .  tamsulosin (FLOMAX) 0.4 MG CAPS capsule, Take 1 capsule (0.4 mg total) by mouth daily., Disp: 90 capsule, Rfl: 0 .  triamcinolone cream (KENALOG) 0.1 %, Apply 1 application topically 2 (two) times daily as needed (skin irritations). , Disp: , Rfl:   Allergies  Allergen Reactions  . 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 Lipitor [Atorvastatin] Hives and Itching  . Other   . Viagra  [Sildenafil Citrate]     vision loss    I personally reviewed active problem list, medication list, allergies, family history, social history with the patient/caregiver today.   ROS  Constitutional: Negative for fever or weight change.  Respiratory: Negative for cough and shortness of breath.   Cardiovascular: Negative for chest pain or palpitations.  Gastrointestinal: Negative for abdominal pain, no bowel changes.  Musculoskeletal: positive for gait problem but no  joint swelling.  Skin: Negative for rash.  Neurological: Negative for dizziness or headache.  No other specific complaints in a complete review of systems (except as listed in HPI above).  Objective  Vitals:   12/06/18 1123  BP: 110/60  Pulse: 94  Resp: 16  Temp: 97.7 F (36.5 C)  TempSrc: Temporal  SpO2: 100%  Weight: 287 lb 6.4 oz (130.4 kg)  Height: 6\' 4"  (1.93 m)    Body mass index is 34.98 kg/m.  Physical Exam  Constitutional: Patient appears well-developed and well-nourished. Obese No distress.  HEENT: head atraumatic, normocephalic, pupils equal and reactive to light,  neck supple Cardiovascular: Normal rate, regular rhythm and normal heart sounds.  No murmur heard. Trace  BLE edema. Pulmonary/Chest: Effort normal and breath sounds normal. No respiratory distress. Abdominal: Soft.  There is no tenderness. Psychiatric: Patient has a normal mood and affect. behavior is normal. Judgment and thought content normal.   Diabetic Foot Exam: Diabetic Foot Exam - Simple  Simple Foot Form Diabetic Foot exam was performed with the following findings: Yes 12/06/2018 11:58 AM  Visual Inspection See comments: Yes Sensation Testing Intact to touch and monofilament testing bilaterally: Yes Pulse Check Posterior Tibialis and Dorsalis pulse intact bilaterally: Yes Comments Maceration between 4th and 5 th toes, he will resume drying between toes and applying lamisil otc      PHQ2/9: Depression screen [facility] 2/9 12/06/2018 11/14/2018 10/23/2018 09/05/2018 03/06/2018  Decreased Interest 0 0 0 0 0  Down, Depressed, Hopeless 0 0 0 0 0  PHQ - 2 Score 0 0 0 0 0  Altered sleeping 0 0 0 0 0  Tired, decreased energy 0 0 0 0 0  Change in appetite 0 0 0 0 0  Feeling bad or failure about yourself  0 0 0 0 0  Trouble concentrating 0 0 0 0 0  Moving slowly or fidgety/restless 0 0 0 0 0  Suicidal thoughts 0 0 0 0 0  PHQ-9 Score 0 0 0 0 0  Difficult doing work/chores - - - Not difficult at all Not difficult at all    phq 9 is negative  Fall Risk: Fall Risk  12/06/2018 11/14/2018 10/23/2018 09/05/2018 06/07/2018  Falls in the past year? 0 0 0 0 0  Number falls in past yr: 0 0 0 0 0  Injury with Fall? 0 0 0 0 0    Functional Status Survey: Is the patient deaf or have difficulty hearing?: No Does the patient have difficulty seeing, even when wearing glasses/contacts?: No Does the patient have difficulty concentrating, remembering, or making decisions?: No Does the patient have difficulty walking or climbing stairs?: No Does the patient have difficulty dressing or bathing?: No Does the patient have difficulty doing errands alone such as visiting a doctor's office or shopping?: No   Assessment & Plan  1. COPD with asthma ([place])  - albuterol (VENTOLIN HFA) 108 (90 Base) MCG/ACT inhaler; Inhale 2 puffs into the lungs 4 (four) times daily.  Dispense: 18 g; Refill: 0  2. Benign hypertension  - COMPLETE METABOLIC PANEL WITH GFR - lisinopril (ZESTRIL) 10 MG tablet; Take 1  tablet (10 mg total) by mouth daily.  Dispense: 90 tablet; Refill: 1  3. DVT of axillary vein, acute left (HCC)  On Xarelto and tolerating it well   4. History of total bilateral knee replacement  Doing well now, trying to go back to work  5. Adrenal insufficiency (HCC)  Under the care of Dr. Honor Junes   6. Type 2 diabetes mellitus with microalbuminuria, without long-term current use of insulin (HCC)  - Hemoglobin A1c - Microalbumin / creatinine urine ratio  7. Atherosclerosis of aorta ([place])  On statin therapy  8. Dyslipidemia  - Lipid panel  9. Anemia associated with acute blood loss  - CBC with Differential/Platelet  10. Needs flu shot  - Flu Vaccine QUAD 36+ mos IM

## 2018-12-07 ENCOUNTER — Other Ambulatory Visit: Payer: Self-pay | Admitting: Family Medicine

## 2018-12-07 LAB — LIPID PANEL
Cholesterol: 160 mg/dL (ref <200)
HDL: 42 mg/dL (ref >=40)
LDL Cholesterol (Calc): 94 mg/dL (calc)
Non-HDL Cholesterol (Calc): 118 mg/dL (calc) (ref <130)
Total CHOL/HDL Ratio: 3.8 (calc) (ref <5.0)
Triglycerides: 137 mg/dL (ref <150)

## 2018-12-07 LAB — MICROALBUMIN / CREATININE URINE RATIO
Creatinine, Urine: 227 mg/dL (ref 20–320)
Microalb Creat Ratio: 28 mcg/mg creat (ref <30)
Microalb, Ur: 6.4 mg/dL

## 2018-12-07 LAB — COMPLETE METABOLIC PANEL WITH GFR
AG Ratio: 1.3 (calc) (ref 1.0–2.5)
ALT: 11 U/L (ref 9–46)
AST: 16 U/L (ref 10–35)
Albumin: 4 g/dL (ref 3.6–5.1)
Alkaline phosphatase (APISO): 46 U/L (ref 35–144)
BUN: 9 mg/dL (ref 7–25)
CO2: 28 mmol/L (ref 20–32)
Calcium: 9.8 mg/dL (ref 8.6–10.3)
Chloride: 104 mmol/L (ref 98–110)
Creat: 0.91 mg/dL (ref 0.70–1.25)
GFR, Est African American: 106 mL/min/{1.73_m2} (ref >=60)
GFR, Est Non African American: 91 mL/min/{1.73_m2} (ref >=60)
Globulin: 3.1 g/dL (calc) (ref 1.9–3.7)
Glucose, Bld: 97 mg/dL (ref 65–99)
Potassium: 3.8 mmol/L (ref 3.5–5.3)
Sodium: 139 mmol/L (ref 135–146)
Total Bilirubin: 0.8 mg/dL (ref 0.2–1.2)
Total Protein: 7.1 g/dL (ref 6.1–8.1)

## 2018-12-07 LAB — CBC WITH DIFFERENTIAL/PLATELET
Absolute Monocytes: 529 cells/uL (ref 200–950)
Basophils Absolute: 42 cells/uL (ref 0–200)
Basophils Relative: 0.5 %
Eosinophils Absolute: 34 cells/uL (ref 15–500)
Eosinophils Relative: 0.4 %
HCT: 40.9 % (ref 38.5–50.0)
Hemoglobin: 13 g/dL — ABNORMAL LOW (ref 13.2–17.1)
Lymphs Abs: 2419 cells/uL (ref 850–3900)
MCH: 26.7 pg — ABNORMAL LOW (ref 27.0–33.0)
MCHC: 31.8 g/dL — ABNORMAL LOW (ref 32.0–36.0)
MCV: 84.2 fL (ref 80.0–100.0)
MPV: 9.9 fL (ref 7.5–12.5)
Monocytes Relative: 6.3 %
Neutro Abs: 5376 cells/uL (ref 1500–7800)
Neutrophils Relative %: 64 %
Platelets: 268 10*3/uL (ref 140–400)
RBC: 4.86 10*6/uL (ref 4.20–5.80)
RDW: 18.6 % — ABNORMAL HIGH (ref 11.0–15.0)
Total Lymphocyte: 28.8 %
WBC: 8.4 10*3/uL (ref 3.8–10.8)

## 2018-12-07 LAB — HEMOGLOBIN A1C
Hgb A1c MFr Bld: 4.5 % of total Hgb (ref <5.7)
Mean Plasma Glucose: 82 (calc)
eAG (mmol/L): 4.6 (calc)

## 2018-12-07 MED ORDER — METFORMIN HCL 1000 MG PO TABS: 1000.0000 mg | ORAL_TABLET | Freq: Every day | ORAL | 0 refills | Status: DC

## 2018-12-11 ENCOUNTER — Telehealth: Payer: Self-pay | Admitting: *Deleted

## 2018-12-11 DIAGNOSIS — Z87891 Personal history of nicotine dependence: Secondary | ICD-10-CM

## 2018-12-11 DIAGNOSIS — Z122 Encounter for screening for malignant neoplasm of respiratory organs: Secondary | ICD-10-CM

## 2018-12-11 NOTE — Telephone Encounter (Signed)
Patient has been notified that annual lung cancer screening low dose CT scan is due currently or will be in near future. Confirmed that patient is within the age range of 55-77, and asymptomatic, (no signs or symptoms of lung cancer). Patient denies illness that would prevent curative treatment for lung cancer if found. Verified smoking history, (former, quit 2009, 30 pack year). The shared decision making visit was done 07/28/17. Patient is agreeable for CT scan being scheduled.

## 2018-12-14 ENCOUNTER — Other Ambulatory Visit: Payer: Self-pay

## 2018-12-14 ENCOUNTER — Ambulatory Visit
Admission: RE | Admit: 2018-12-14 | Discharge: 2018-12-14 | Disposition: A | Discharge status: Home / Self Care | Payer: BC Managed Care – PPO | Source: Ambulatory Visit | Attending: Nurse Practitioner | Admitting: Nurse Practitioner

## 2018-12-14 DIAGNOSIS — Z122 Encounter for screening for malignant neoplasm of respiratory organs: Secondary | ICD-10-CM | POA: Diagnosis present

## 2018-12-14 DIAGNOSIS — Z87891 Personal history of nicotine dependence: Secondary | ICD-10-CM | POA: Insufficient documentation

## 2018-12-14 IMAGING — CT CT CHEST LUNG CANCER SCREENING LOW DOSE
2 of 5 series · 15 of 40 positions shown, 18 images · non-contrast
Comparison: Low-dose lung cancer screening CT chest dated
[DATE]. CT chest dated [DATE].

CLINICAL DATA: 58-year-old male former smoker, quit 11 years ago,
with 30 pack-year history of smoking, for follow-up lung cancer
screening

EXAM:
CT CHEST WITHOUT CONTRAST LOW-DOSE FOR LUNG CANCER SCREENING
TECHNIQUE: Multidetector CT imaging of the chest was performed following the
standard protocol without IV contrast.

[Series 3: lung · axial · 0.76mm/px · z∈[-1260,-918]mm · 12 of 380 slices shown, 15 images]
[im 19/380  mediastinal]
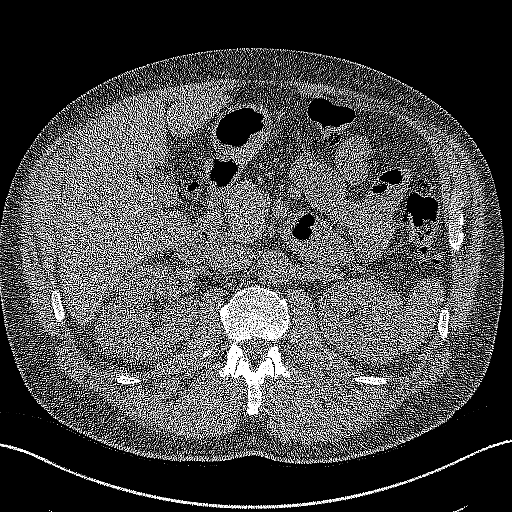
[im 19/380  lung]
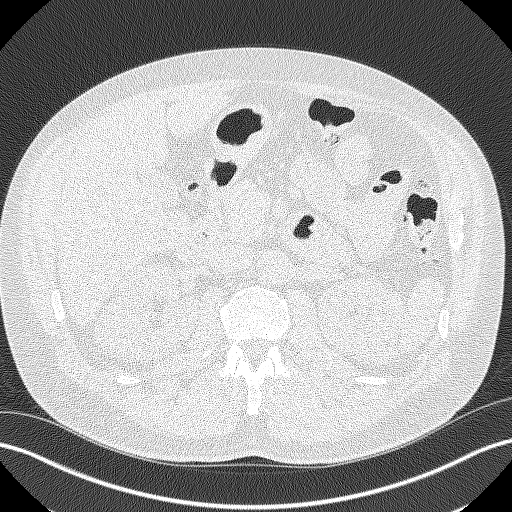
[im 57/380  lung]
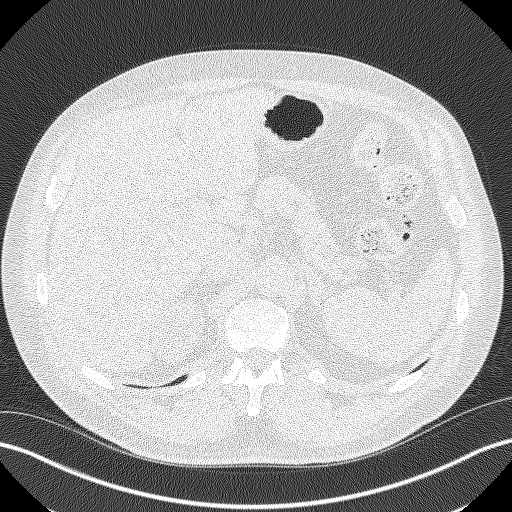
[im 76/380  lung]
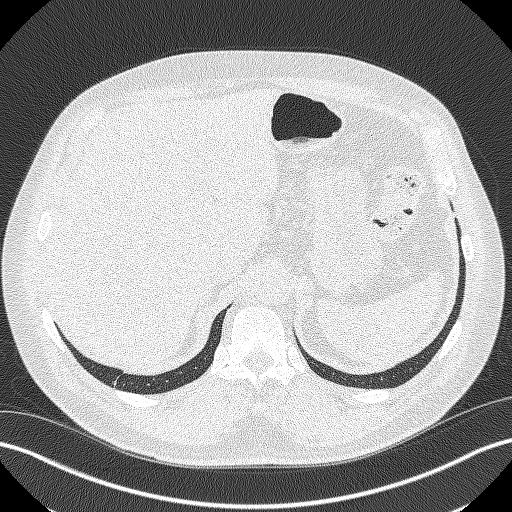
[im 114/380  lung]
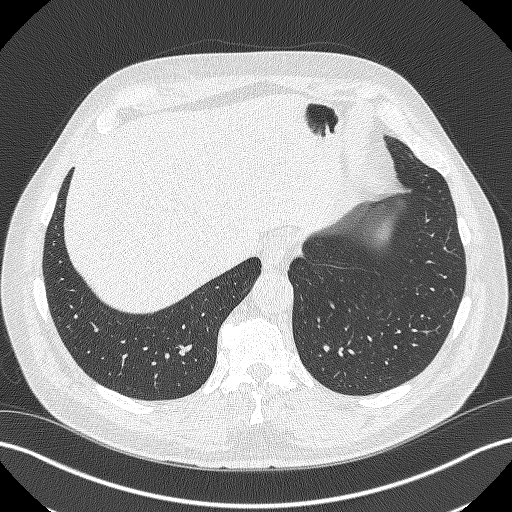
[im 152/380  mediastinal]
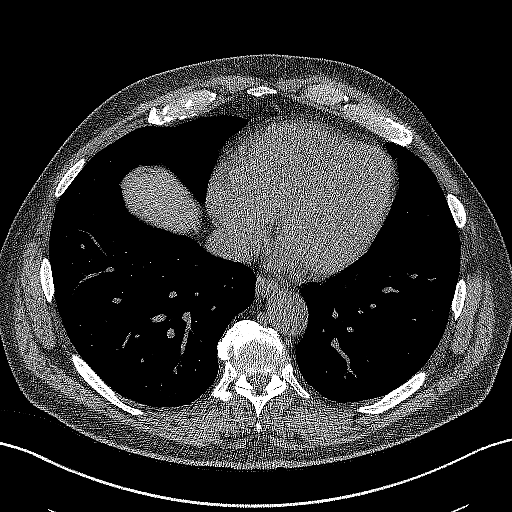
[im 152/380  lung]
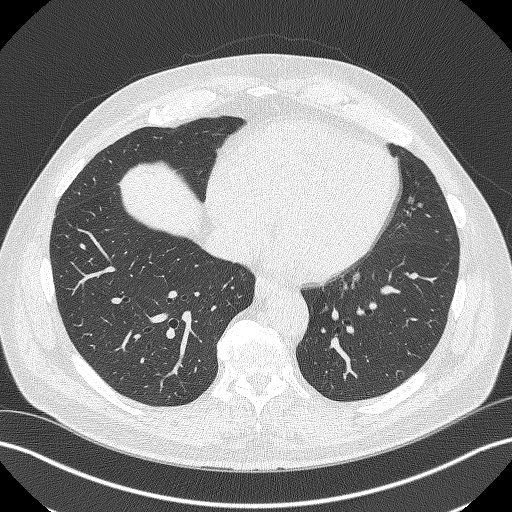
[im 171/380  lung]
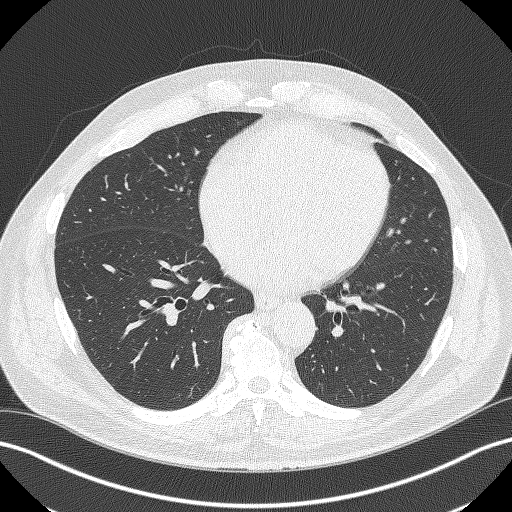
[im 209/380  lung]
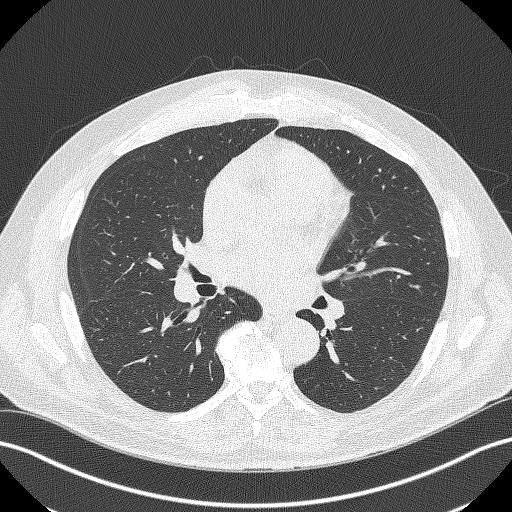
[im 228/380  lung]
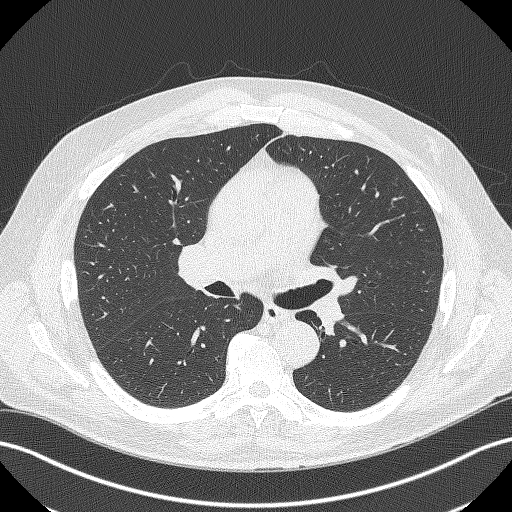
[im 266/380  mediastinal]
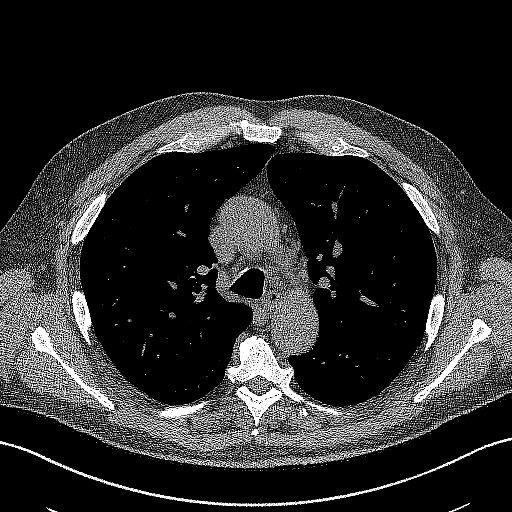
[im 266/380  lung]
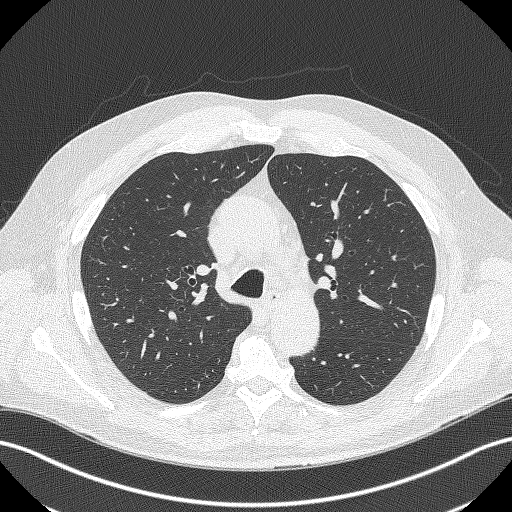
[im 304/380  lung]
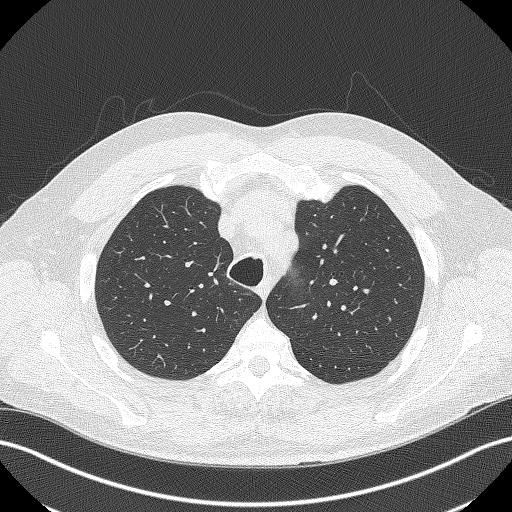
[im 323/380  lung]
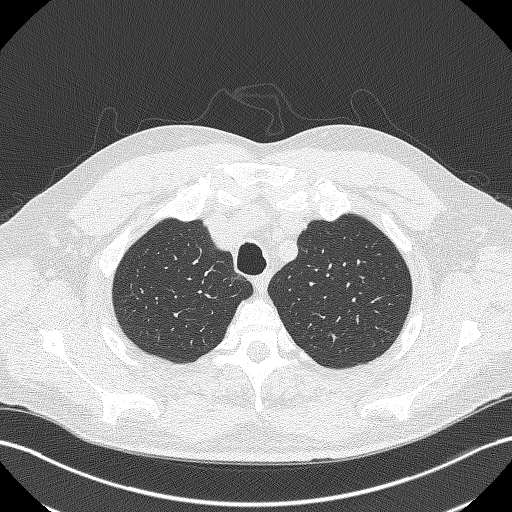
[im 361/380  lung]
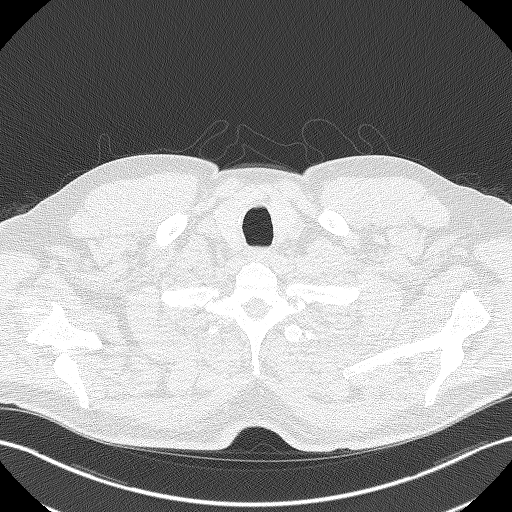

[Series 4: coronal lung · coronal · 0.74mm/px · 3 of 326 slices shown]
[im 66/326  lung]
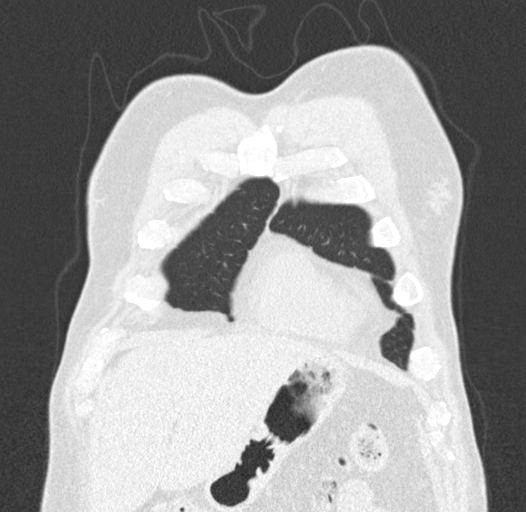
[im 131/326  lung]
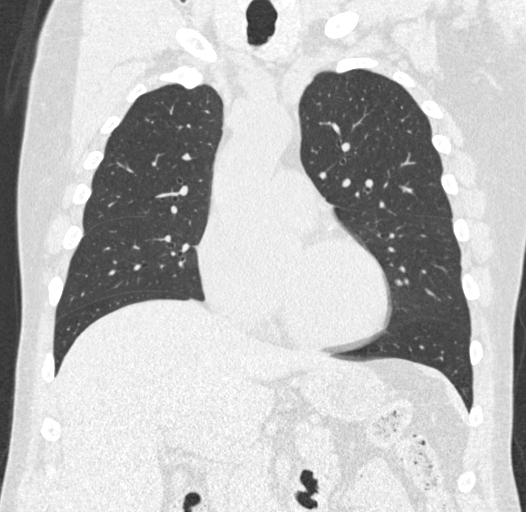
[im 196/326  lung]
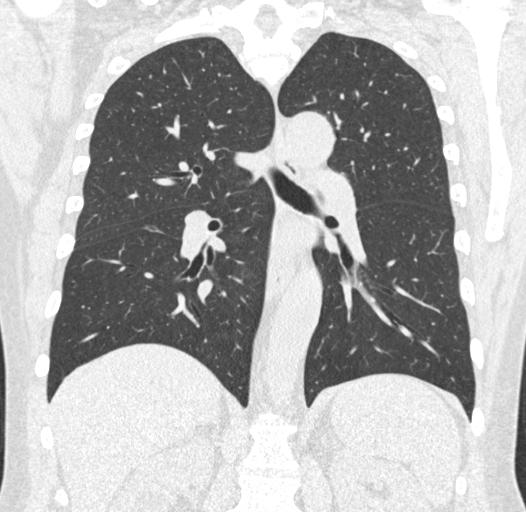

[15 of 40 positions shown; findings below may reference images not displayed]

FINDINGS: Cardiovascular: The heart is normal in size. No pericardial
effusion.

No evidence of thoracic aortic aneurysm.

Mild coronary atherosclerosis of the LAD.

Mediastinum/Nodes: No suspicious mediastinal lymphadenopathy.

Visualized thyroid is unremarkable.

Lungs/Pleura: No focal consolidation.

No suspicious pulmonary nodules.

No pleural effusion or pneumothorax.

Upper Abdomen: Visualized upper abdomen is notable for
cholelithiasis and right renal cysts.

Musculoskeletal: Degenerative changes of the visualized
thoracolumbar spine.
IMPRESSION: Lung-RADS 1, negative. Continue annual screening with low-dose chest
CT without contrast in 12 months.

## 2018-12-19 ENCOUNTER — Encounter: Payer: Self-pay | Admitting: *Deleted

## 2018-12-19 ENCOUNTER — Ambulatory Visit: Payer: BC Managed Care – PPO | Admitting: Family Medicine

## 2018-12-21 ENCOUNTER — Ambulatory Visit: Payer: BC Managed Care – PPO | Admitting: Family Medicine

## 2018-12-26 ENCOUNTER — Telehealth: Payer: BC Managed Care – PPO | Admitting: Physician Assistant

## 2018-12-26 ENCOUNTER — Other Ambulatory Visit: Payer: Self-pay | Admitting: Family Medicine

## 2018-12-26 DIAGNOSIS — L0390 Cellulitis, unspecified: Secondary | ICD-10-CM

## 2018-12-26 DIAGNOSIS — J449 Chronic obstructive pulmonary disease, unspecified: Secondary | ICD-10-CM

## 2018-12-26 NOTE — Progress Notes (Signed)
Based on what you shared with me, I feel your condition warrants further evaluation and I recommend that you be seen for a face to face office visit.  Due to your history of infections related to your knee replacements, as well has history of diabetes and adrenal insufficiency, I would recommend you seek face-to-face evaluation today at an urgent care or at your PCP's office.  You may require bloodwork or imaging additional to determine the extent of the infection, which I cannot do through a virtual visit.   NOTE: If you entered your credit card information for this eVisit, you will not be charged. You may see a "hold" on your card for the $35 but that hold will drop off and you will not have a charge processed.  If you are having a true medical emergency please call 911.     For an urgent face to face visit, [place] has four urgent care centers for your convenience:   . [facility]    [phone]                  Get Driving Directions  [address], [place] [address] . 10 am to 8 pm Monday-Friday . 12 pm to 8 pm Saturday-Sunday   . [facility] at [place]                  Get Driving Directions  [address], [place], [place] [address] . 8 am to 8 pm Monday-Friday . 9 am to 6 pm Saturday . 11 am to 6 pm Sunday   . [facility] at [place]                  Get Driving Directions   [address] [place], [place] [address] . 8 am to 8 pm Monday-Friday . 8 am to 4 pm Saturday-Sunday    . [facility] at [place]                    Get Driving Directions  [phone]  [address], [place], [place] [address]  . Monday-Friday, 12 PM to 6 PM    Your e-visit answers were reviewed by a board certified advanced clinical practitioner to complete your personal care plan.  Thank you for using e-Visits.  Greater than 5  minutes, yet less than 10 minutes of time have been spent researching, coordinating, and implementing care for this patient today.

## 2018-12-27 ENCOUNTER — Ambulatory Visit: Payer: BC Managed Care – PPO | Admitting: Family Medicine

## 2019-01-13 ENCOUNTER — Other Ambulatory Visit: Payer: Self-pay | Admitting: Family Medicine

## 2019-01-13 DIAGNOSIS — J449 Chronic obstructive pulmonary disease, unspecified: Secondary | ICD-10-CM

## 2019-01-28 ENCOUNTER — Other Ambulatory Visit: Payer: Self-pay | Admitting: Family Medicine

## 2019-01-28 DIAGNOSIS — E1129 Type 2 diabetes mellitus with other diabetic kidney complication: Secondary | ICD-10-CM

## 2019-01-28 DIAGNOSIS — E1142 Type 2 diabetes mellitus with diabetic polyneuropathy: Secondary | ICD-10-CM

## 2019-01-29 ENCOUNTER — Other Ambulatory Visit: Payer: Self-pay | Admitting: Family Medicine

## 2019-01-30 ENCOUNTER — Other Ambulatory Visit: Payer: Self-pay | Admitting: Family Medicine

## 2019-03-09 ENCOUNTER — Ambulatory Visit: Payer: BC Managed Care – PPO | Admitting: Family Medicine

## 2019-03-13 ENCOUNTER — Encounter: Payer: Self-pay | Admitting: Family Medicine

## 2019-03-13 ENCOUNTER — Ambulatory Visit (INDEPENDENT_AMBULATORY_CARE_PROVIDER_SITE_OTHER): Payer: BC Managed Care – PPO | Admitting: Family Medicine

## 2019-03-13 DIAGNOSIS — F418 Other specified anxiety disorders: Secondary | ICD-10-CM

## 2019-03-13 DIAGNOSIS — E1140 Type 2 diabetes mellitus with diabetic neuropathy, unspecified: Secondary | ICD-10-CM | POA: Diagnosis not present

## 2019-03-13 DIAGNOSIS — E1129 Type 2 diabetes mellitus with other diabetic kidney complication: Secondary | ICD-10-CM

## 2019-03-13 DIAGNOSIS — E2740 Unspecified adrenocortical insufficiency: Secondary | ICD-10-CM | POA: Diagnosis not present

## 2019-03-13 DIAGNOSIS — I10 Essential (primary) hypertension: Secondary | ICD-10-CM

## 2019-03-13 DIAGNOSIS — E785 Hyperlipidemia, unspecified: Secondary | ICD-10-CM

## 2019-03-13 DIAGNOSIS — I82A12 Acute embolism and thrombosis of left axillary vein: Secondary | ICD-10-CM

## 2019-03-13 DIAGNOSIS — R809 Proteinuria, unspecified: Secondary | ICD-10-CM

## 2019-03-13 DIAGNOSIS — I700 Atherosclerosis of aorta: Secondary | ICD-10-CM

## 2019-03-13 DIAGNOSIS — I824Z2 Acute embolism and thrombosis of unspecified deep veins of left distal lower extremity: Secondary | ICD-10-CM

## 2019-03-13 DIAGNOSIS — J449 Chronic obstructive pulmonary disease, unspecified: Secondary | ICD-10-CM

## 2019-03-13 DIAGNOSIS — D62 Acute posthemorrhagic anemia: Secondary | ICD-10-CM

## 2019-03-13 MED ORDER — CITALOPRAM HYDROBROMIDE 20 MG PO TABS: 20.0000 mg | ORAL_TABLET | Freq: Every day | ORAL | 1 refills | Status: DC

## 2019-03-13 MED ORDER — METFORMIN HCL 1000 MG PO TABS: 1000.0000 mg | ORAL_TABLET | Freq: Every day | ORAL | 0 refills | Status: DC

## 2019-03-13 MED ORDER — LISINOPRIL 20 MG PO TABS: 20.0000 mg | ORAL_TABLET | Freq: Every day | ORAL | 1 refills | Status: DC

## 2019-03-13 MED ORDER — BREO ELLIPTA 100-25 MCG/INH IN AEPB: 1.0000 | INHALATION_SPRAY | Freq: Every day | RESPIRATORY_TRACT | 1 refills | Status: DC

## 2019-03-13 NOTE — Progress Notes (Addendum)
Name: Isaac Cross   MRN: [id number]    DOB: [birth year]   Date:03/13/2019       Progress Note  Subjective  Chief Complaint  Chief Complaint  Patient presents with  . Hypertension  . Diabetes  . Hyperlipidemia  . Depression  . COPD  . Asthma    I connected with  [name]  on 03/13/19 at  7:40 AM EST by a telephone encounter and verified that I am speaking with the correct person using two identifiers.  I discussed the limitations of evaluation and management by telemedicine and the availability of in person appointments. The patient expressed understanding and agreed to proceed. Staff also discussed with the patient that there may be a patient responsible charge related to this service. Patient Location: at home  Provider Location: [facility]   HPI  S/P bilateral total knee Replacements: It was done by Dr. Harlow Mares on May 15th,2020 in [facility] finally doing well, only using a cane when out of the house, finished PT,  rom is over 110 degrees on both knees, he states he has been doing well, no pain.     Nocturia.:he stop flomax ( started to take after surgery ) , nocturia is now once or twice per night., back to baseline since prior to surgery   COPD/Asthma:usually mild and controlled,using Breo and denies cough, SOB or wheezing, doing well.Unchanged   OSA: compliant with CPAP machine, he denies waking up with headaches, snoring under control with CPAP. Unchanged   DMII: Taking Metformin 1000 mg daily since A1C dropped . He lost 30 lbs after knee surgery this past Summer. Last A1C was down to 4.5, urine micro negative, previously A1C was 6.8% and 6.9% No polyphagia, polydipsia or polyuria at this time.   HTN:we changed from lisinopril from 40 mg to 20 mg and bp was still low so we decreased to 10 mg but he states bp started to go up at home, so he is back on 20 mg of lisinopril. He has not been dizzy, palpitation, SOB or chest pain   DVT:  after knee surgery, switched to coumadin because of acute renal failure, but since discharged, kidney function is back to normal, and tolerating Xarelto, no bleeding noted. DVT diagnosed in May and we will keep him on for 6 months   Atherosclerosis abdominal aorta: taking statin and Xarelto   Adrenal insufficiency: size of adrenal glands improved still taking medication given by Dr. Honor Junes   Patient Active Problem List   Diagnosis Date Noted  . History of total knee arthroplasty 09/04/2018  . Coronary atherosclerosis due to calcified coronary lesion 12/02/2017  . Atherosclerosis of abdominal aorta ([place]) 06/17/2015  . Fatty liver disease, nonalcoholic 38/45/3646  . Renal cyst, right 06/17/2015  . BPH (benign prostatic hyperplasia) 05/25/2015  . Microscopic hematuria 05/25/2015  . Second degree hemorrhoids   . Diverticulosis of large intestine without diverticulitis   . Asthma, mild intermittent, well-controlled 10/16/2014  . Allergic rhinitis 10/13/2014  . ED (erectile dysfunction) of organic origin 07/22/2014  . Gastro-esophageal reflux disease without esophagitis 07/22/2014  . Benign hypertension 07/22/2014  . 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[place]) 07/22/2014  . Adult BMI 30+ 07/22/2014  . Chronic obstructive asthma ([place]) 07/22/2014  . Depression with anxiety 07/22/2014  . Dyslipidemia 07/22/2014  . Genital herpes 07/22/2014  . 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 Osteoarthritis of both knees 07/22/2014  . Obstructive apnea 05/14/2013  . 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  Blood in the urine 09/20/2006    Past Surgical History:  Procedure Laterality Date  . BLADDER REPAIR    . COLONOSCOPY WITH PROPOFOL N/A 04/07/2015   Procedure: COLONOSCOPY WITH PROPOFOL;  Surgeon: Lucilla Lame, MD;  Location: [place];  Service: Endoscopy;  Laterality: N/A;  Diabetic - oral meds CPAP  . HAND SURGERY     4TH AND 5TH FINGER  . I&D KNEE WITH POLY  EXCHANGE Right 09/18/2018   Procedure: IRRIGATION AND DEBRIDEMENT KNEE WITH POLY EXCHANGE;  Surgeon: Lovell Sheehan, MD;  Location: [facility];  Service: Orthopedics;  Laterality: Right;  . KNEE ARTHROSCOPY Right   . 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 TOTAL KNEE ARTHROPLASTY Left 09/17/2018   Procedure: I &D left knee and polyethylene exchange;  Surgeon: Lovell Sheehan, MD;  Location: [facility];  Service: Orthopedics;  Laterality: Left;  Marland Kitchen VEIN SURGERY Right     Family History  Problem Relation Age of Onset  . Hypertension Mother   . Diabetes Mother   . Diabetes Father   . Hypertension Father   . Cancer Father   . Seizures Daughter   . Lupus Daughter   . Cancer Maternal Grandfather        Prostate  . Diabetes Paternal Grandfather   . Prostate cancer Maternal Uncle   . Bladder Cancer Neg Hx   . Kidney cancer Neg Hx     Social History   Socioeconomic History  . Marital status: Married    Spouse name: Not on file  . Number of children: 2  . Years of education: Not on file  . Highest education level: Bachelor's degree (e.g., BA, AB, BS)  Occupational History  . Occupation: case Freight forwarder   Social Needs  . Financial resource strain: Not hard at all  . Food insecurity    Worry: Never true    Inability: Never true  . Transportation needs    Medical: No    Non-medical: No  Tobacco Use  . Smoking status: Former Smoker    Packs/day: 1.00    Years: 10.00    Pack years: 10.00    Types: Cigarettes    Start date: 04/20/1995    Quit date: 04/19/2005    Years since quitting: 13.9  . Smokeless tobacco: Never Used  Substance and Sexual Activity  . Alcohol use: Yes    Alcohol/week: 10.0 standard drinks    Types: 10 Cans of beer per week    Comment: occasional  . Drug use: No  . Sexual activity: Yes    Partners: Female  Lifestyle  . Physical activity    Days per week: 0 days    Minutes per session: 0 min  . Stress: Not at all  Relationships  . Social  connections    Talks on phone: More than three times a week    Gets together: Once a week    Attends religious service: Never    Active member of club or organization: No    Attends meetings of clubs or organizations: Never    Relationship status: Married  . Intimate partner violence    Fear of current or ex partner: No    Emotionally abused: No    Physically abused: No    Forced sexual activity: No  Other Topics Concern  . Not on file  Social History Narrative   Lives at home with wife, using a walker since his surgery recently.     Current Outpatient Medications:  .  acetaminophen (TYLENOL) 500 MG tablet, Take 1 tablet (500 mg total) by mouth every 6 (six) hours as needed., Disp: 90 tablet, Rfl: 0 .  acidophilus (RISAQUAD) CAPS capsule, Take 1 capsule by mouth daily., Disp: 30 capsule, Rfl: 0 .  albuterol (VENTOLIN HFA) 108 (90 Base) MCG/ACT inhaler, INHALE 2 PUFFS INTO THE LUNGS 4 (FOUR) TIMES DAILY., Disp: 18 g, Rfl: 2 .  cetirizine (ZYRTEC) 10 MG tablet, Take 10 mg by mouth daily., Disp: , Rfl:  .  citalopram (CELEXA) 20 MG tablet, Take 1 tablet (20 mg total) by mouth daily., Disp: 90 tablet, Rfl: 1 .  diclofenac sodium (VOLTAREN) 1 % GEL, APPLY 4 GRAMS TOPICALLY FOUR TIMES A DAY (Patient taking differently: Apply 4 g topically 4 (four) times daily. ), Disp: 100 g, Rfl: 2 .  ferrous sulfate 325 (65 FE) MG EC tablet, Take 1 tablet (325 mg total) by mouth 3 (three) times daily with meals., Disp: 90 tablet, Rfl: 2 .  fludrocortisone (FLORINEF) 0.1 MG tablet, Take 1 tablet (0.1 mg total) by mouth 2 (two) times daily., Disp: 60 tablet, Rfl: 0 .  fluticasone furoate-vilanterol (BREO ELLIPTA) 100-25 MCG/INH AEPB, Inhale 1 puff into the lungs daily., Disp: 180 each, Rfl: 1 .  hydrocortisone (CORTEF) 20 MG tablet, Take 1 tablet (20 mg total) by mouth 2 (two) times daily., Disp: 60 tablet, Rfl: 0 .  lisinopril (ZESTRIL) 10 MG tablet, Take 1 tablet (10 mg total) by mouth daily., Disp: 90  tablet, Rfl: 1 .  lisinopril (ZESTRIL) 40 MG tablet, TAKE 1 TABLET BY MOUTH EVERY DAY, Disp: 90 tablet, Rfl: 0 .  metFORMIN (GLUCOPHAGE) 1000 MG tablet, TAKE 1 TABLET (1,000 MG TOTAL) BY MOUTH 2 (TWO) TIMES DAILY WITH A MEAL., Disp: 180 tablet, Rfl: 1 .  Multiple Vitamins-Minerals (MENS MULTIVITAMIN PLUS) TABS, Take by mouth., Disp: , Rfl:  .  Omega-3 Fatty Acids (FISH OIL) 1000 MG CAPS, Take by mouth daily., Disp: , Rfl:  .  SENNA-PLUS 8.6-50 MG tablet, Take 2 tablets by mouth every morning., Disp: , Rfl:  .  tamsulosin (FLOMAX) 0.4 MG CAPS capsule, Take 1 capsule (0.4 mg total) by mouth daily., Disp: 90 capsule, Rfl: 0 .  triamcinolone cream (KENALOG) 0.1 %, Apply 1 application topically 2 (two) times daily as needed (skin irritations). , Disp: , Rfl:  .  XARELTO 20 MG TABS tablet, TAKE 1 TABLET (20 MG TOTAL) BY MOUTH DAILY WITH SUPPER. IN PLACE OF WARFARIN, Disp: 90 tablet, Rfl: 0  Allergies  Allergen Reactions  . 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 Lipitor [Atorvastatin] Hives and Itching  . Other   . Viagra  [Sildenafil Citrate]     vision loss    I personally reviewed active problem list, medication list, allergies, family history, social history, health maintenance with the patient/caregiver today.   ROS  Ten systems reviewed and is negative except as mentioned in HPI   Objective  Virtual encounter, vitals not obtained.  There is no height or weight on file to calculate BMI.  Physical Exam  Awake, alert and oriented   PHQ2/9: Depression screen [facility] 2/9 03/13/2019 12/06/2018 11/14/2018 10/23/2018 09/05/2018  Decreased Interest 0 0 0 0 0  Down, Depressed, Hopeless 0 0 0 0 0  PHQ - 2 Score 0 0 0 0 0  Altered sleeping 0 0 0 0 0  Tired, decreased energy 0 0 0 0 0  Change in appetite 0 0 0 0 0  Feeling bad or failure about yourself  0 0 0 0 0  Trouble concentrating 0 0 0 0 0  Moving slowly or fidgety/restless 0 0 0 0 0  Suicidal thoughts 0 0  0 0 0  PHQ-9 Score 0 0 0 0 0  Difficult doing work/chores - - - - Not difficult at all  Some recent data might be hidden   PHQ-2/9 Result is negative.    Fall Risk: Fall Risk  03/13/2019 12/06/2018 11/14/2018 10/23/2018 09/05/2018  Falls in the past year? 0 0 0 0 0  Number falls in past yr: 0 0 0 0 0  Injury with Fall? 0 0 0 0 0    Assessment & Plan  1. Type 2 diabetes mellitus with microalbuminuria, without long-term current use of insulin (HCC)  - POCT HgB A1C - Hemoglobin A1c  2. Depression with anxiety  - citalopram (CELEXA) 20 MG tablet; Take 1 tablet (20 mg total) by mouth daily.  Dispense: 90 tablet; Refill: 1  3. Type 2 diabetes mellitus with other diabetic kidney complication (HCC)  - metFORMIN (GLUCOPHAGE) 1000 MG tablet; Take 1 tablet (1,000 mg total) by mouth daily with breakfast.  Dispense: 90 tablet; Refill: 0  4. Type 2 diabetes mellitus with diabetic polyneuropathy (HCC)  - metFORMIN (GLUCOPHAGE) 1000 MG tablet; Take 1 tablet (1,000 mg total) by mouth daily with breakfast.  Dispense: 90 tablet; Refill: 0  5. Adrenal insufficiency ([place])   6. DVT left lower extremity , acute left (HCC)  - US Venous Img Lower Unilateral Left; Future  7. Anemia associated with acute blood loss  - CBC with Differential/Platelet  8. Atherosclerosis of aorta ([place])  On statin therapy   9. Dyslipidemia   10. COPD with asthma ([place])  - fluticasone furoate-vilanterol (BREO ELLIPTA) 100-25 MCG/INH AEPB; Inhale 1 puff into the lungs daily.  Dispense: 180 each; Refill: 1  11. Benign hypertension  - COMPLETE METABOLIC PANEL WITH GFR - lisinopril (ZESTRIL) 20 MG tablet; Take 1 tablet (20 mg total) by mouth daily.  Dispense: 90 tablet; Refill: 1   I discussed the assessment and treatment plan with the patient. The patient was provided an opportunity to ask questions and all were answered. The patient agreed with the plan and demonstrated an understanding of the instructions.   The patient was advised to call back or seek an in-person evaluation if the symptoms worsen or if the condition fails to improve as anticipated.  I provided 25  minutes of non-face-to-face time during this encounter.

## 2019-03-14 ENCOUNTER — Ambulatory Visit: Payer: BC Managed Care – PPO

## 2019-03-14 ENCOUNTER — Other Ambulatory Visit: Payer: Self-pay

## 2019-03-14 VITALS — BP 140/94 | HR 74 | Temp 96.9°F | Resp 16 | Ht 76.0 in | Wt 305.1 lb

## 2019-03-14 DIAGNOSIS — I10 Essential (primary) hypertension: Secondary | ICD-10-CM

## 2019-03-14 NOTE — Progress Notes (Signed)
Patient is here for a blood pressure check. Patient denies chest pain, palpitations, shortness of breath or visual disturbances. At previous visit blood pressure was 110/60 with a heart rate of 94. Today during nurse visit first check blood pressure was 154/88. After resting for 10 minutes it was 140/94 and heart rate was 74 . He does take any blood pressure medications.

## 2019-03-15 LAB — COMPLETE METABOLIC PANEL WITH GFR
AG Ratio: 1.4 (calc) (ref 1.0–2.5)
ALT: 13 U/L (ref 9–46)
AST: 16 U/L (ref 10–35)
Albumin: 4 g/dL (ref 3.6–5.1)
Alkaline phosphatase (APISO): 47 U/L (ref 35–144)
BUN: 14 mg/dL (ref 7–25)
CO2: 31 mmol/L (ref 20–32)
Calcium: 10.1 mg/dL (ref 8.6–10.3)
Chloride: 101 mmol/L (ref 98–110)
Creat: 1.08 mg/dL (ref 0.70–1.25)
GFR, Est African American: 86 mL/min/{1.73_m2} (ref >=60)
GFR, Est Non African American: 74 mL/min/{1.73_m2} (ref >=60)
Globulin: 2.9 g/dL (calc) (ref 1.9–3.7)
Glucose, Bld: 122 mg/dL — ABNORMAL HIGH (ref 65–99)
Potassium: 4 mmol/L (ref 3.5–5.3)
Sodium: 138 mmol/L (ref 135–146)
Total Bilirubin: 0.6 mg/dL (ref 0.2–1.2)
Total Protein: 6.9 g/dL (ref 6.1–8.1)

## 2019-03-15 LAB — CBC WITH DIFFERENTIAL/PLATELET
Absolute Monocytes: 578 cells/uL (ref 200–950)
Basophils Absolute: 41 cells/uL (ref 0–200)
Basophils Relative: 0.7 %
Eosinophils Absolute: 59 cells/uL (ref 15–500)
Eosinophils Relative: 1 %
HCT: 44.3 % (ref 38.5–50.0)
Hemoglobin: 14.8 g/dL (ref 13.2–17.1)
Lymphs Abs: 2637 cells/uL (ref 850–3900)
MCH: 26.9 pg — ABNORMAL LOW (ref 27.0–33.0)
MCHC: 33.4 g/dL (ref 32.0–36.0)
MCV: 80.4 fL (ref 80.0–100.0)
MPV: 10.3 fL (ref 7.5–12.5)
Monocytes Relative: 9.8 %
Neutro Abs: 2584 cells/uL (ref 1500–7800)
Neutrophils Relative %: 43.8 %
Platelets: 256 10*3/uL (ref 140–400)
RBC: 5.51 10*6/uL (ref 4.20–5.80)
RDW: 13.4 % (ref 11.0–15.0)
Total Lymphocyte: 44.7 %
WBC: 5.9 10*3/uL (ref 3.8–10.8)

## 2019-03-15 LAB — HEMOGLOBIN A1C
Hgb A1c MFr Bld: 6.7 % of total Hgb — ABNORMAL HIGH (ref <5.7)
Mean Plasma Glucose: 146 (calc)
eAG (mmol/L): 8.1 (calc)

## 2019-03-20 ENCOUNTER — Encounter: Payer: Self-pay | Admitting: Family Medicine

## 2019-04-02 ENCOUNTER — Encounter: Payer: Self-pay | Admitting: Family Medicine

## 2019-04-11 ENCOUNTER — Other Ambulatory Visit: Payer: Self-pay | Admitting: Family Medicine

## 2019-04-11 DIAGNOSIS — J449 Chronic obstructive pulmonary disease, unspecified: Secondary | ICD-10-CM

## 2019-04-11 NOTE — Telephone Encounter (Signed)
Requested medication (s) are due for refill today: no  Requested medication (s) are on the active medication list: yes  Last refill: 03/16/2019  Future visit scheduled: no  Notes to clinic:    One inhaler should last at least one month. If the patient is requesting refills earlier, contact the patient to check for uncontrolled symptoms     Requested Prescriptions  Pending Prescriptions Disp Refills   albuterol (VENTOLIN HFA) 108 (90 Base) MCG/ACT inhaler [Pharmacy Med Name: ALBUTEROL HFA (VENTOLIN) INH]  2    Sig: INHALE 2 PUFFS INTO THE LUNGS 4 (FOUR) TIMES DAILY.      Pulmonology:  Beta Agonists Failed - 04/11/2019 12:53 AM      Failed - One inhaler should last at least one month. If the patient is requesting refills earlier, contact the patient to check for uncontrolled symptoms.      Passed - Valid encounter within last 12 months    Recent Outpatient Visits           4 weeks ago Type 2 diabetes mellitus with microalbuminuria, without long-term current use of insulin [facility])   [facility] Steele Sizer, MD   4 months ago COPD with asthma [facility])   [facility] Steele Sizer, MD   4 months ago COPD with asthma [facility])   [facility] Steele Sizer, MD   5 months ago Mass of both adrenal glands [facility])   [facility] Steele Sizer, MD   7 months ago History of knee replacement, total, bilateral   [facility] Steele Sizer, MD

## 2019-04-17 ENCOUNTER — Telehealth: Payer: Self-pay

## 2019-04-17 DIAGNOSIS — I824Z2 Acute embolism and thrombosis of unspecified deep veins of left distal lower extremity: Secondary | ICD-10-CM

## 2019-04-17 DIAGNOSIS — I824Z1 Acute embolism and thrombosis of unspecified deep veins of right distal lower extremity: Secondary | ICD-10-CM

## 2019-04-17 NOTE — Telephone Encounter (Signed)
Attempted to call to clarify orders this evening, however there was no answer at the provided call back number.  Will attempt to call first thing in the morning.  It appears orders are correct for LLE [place].

## 2019-04-17 NOTE — Telephone Encounter (Signed)
Copied from [place] [phone]. Topic: General - Other >> Apr 17, 2019 12:04 PM Greggory Keen D wrote: Reason for CRM: Jerline Pain with [facility] [place] called saying they need clarification on the order for the [place] on pt for tomorrow. CB#  [phone]   Called and spoke with Jerline Pain. Order was submitted for DVT of axillary vein, which is under the arm. Do you want [place] of left leg or axillary vein. Please clarify. Patient has appointment tomorrow, please resubmit order.

## 2019-04-17 NOTE — Addendum Note (Signed)
Addended by: Steele Sizer F on: 04/17/2019 07:51 PM   Modules accepted: Orders

## 2019-04-18 ENCOUNTER — Other Ambulatory Visit: Payer: Self-pay

## 2019-04-18 ENCOUNTER — Ambulatory Visit
Admission: RE | Admit: 2019-04-18 | Discharge: 2019-04-18 | Disposition: A | Discharge status: Home / Self Care | Payer: BC Managed Care – PPO | Source: Ambulatory Visit | Attending: Family Medicine | Admitting: Family Medicine

## 2019-04-18 DIAGNOSIS — I824Z2 Acute embolism and thrombosis of unspecified deep veins of left distal lower extremity: Secondary | ICD-10-CM | POA: Diagnosis present

## 2019-04-18 IMAGING — US US EXTREM LOW VENOUS*L*
1 series · 13 of 24 positions shown · non-contrast
Comparison: None.

CLINICAL DATA: DVT follow-up



[Series 1: us extrem low venous*left* · 0.08mm/px · 13 of 33 slices shown]
[im 1/33]
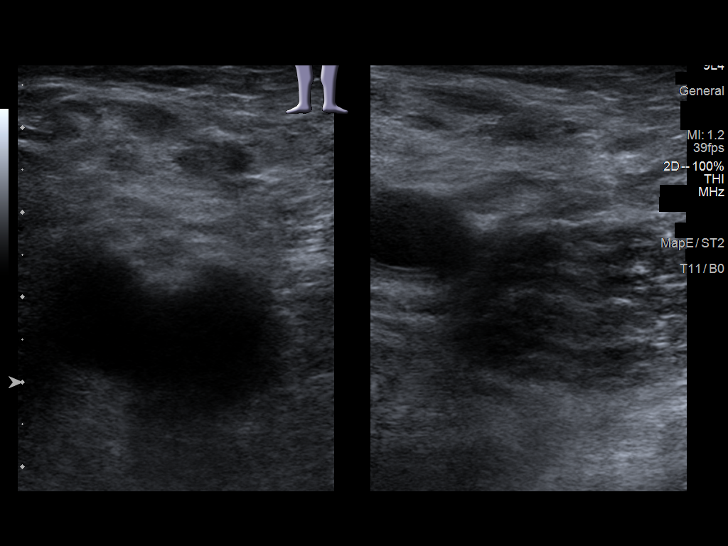
[im 3/33]
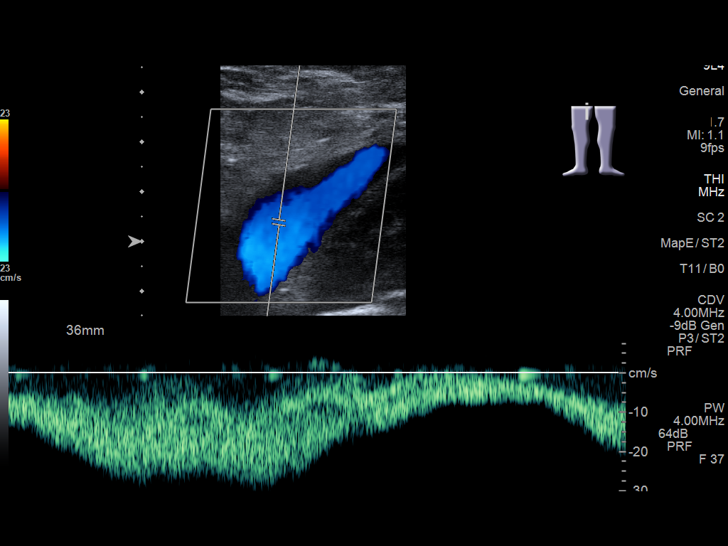
[im 6/33]
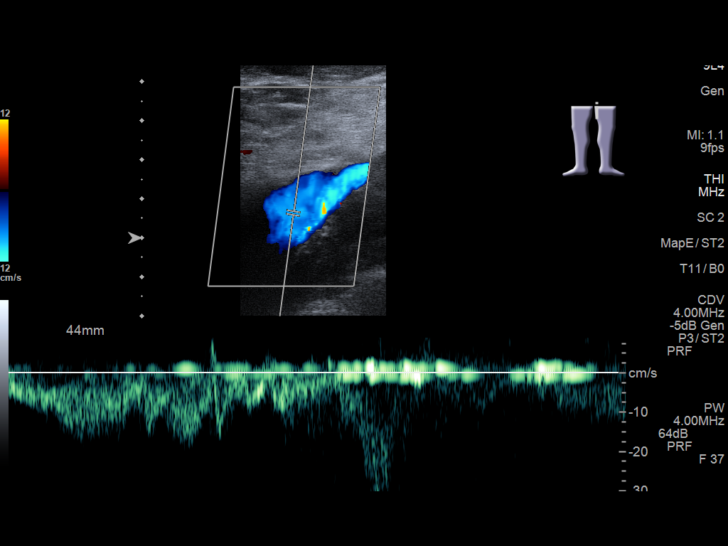
[im 9/33]
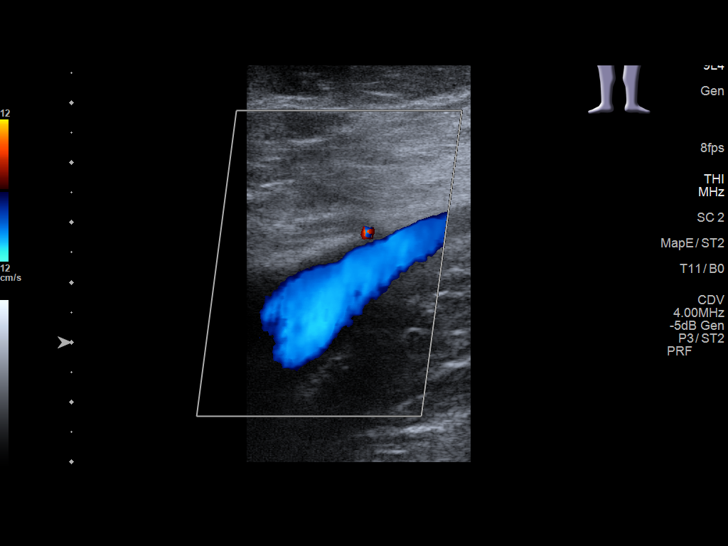
[im 12/33]
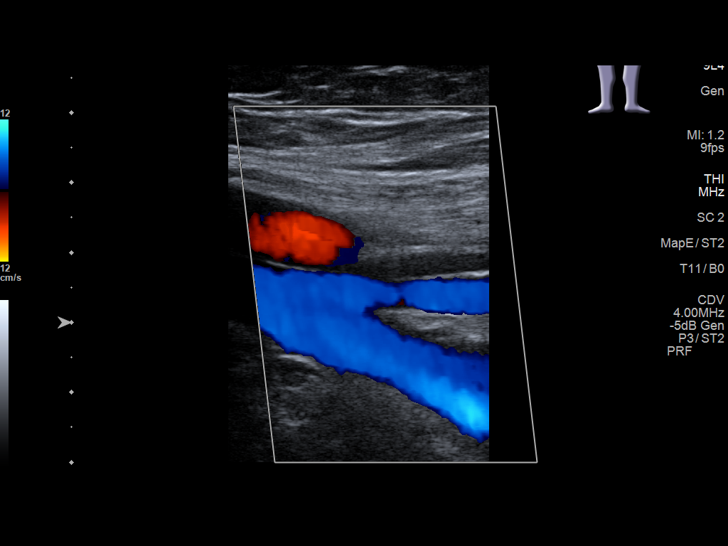
[im 14/33]
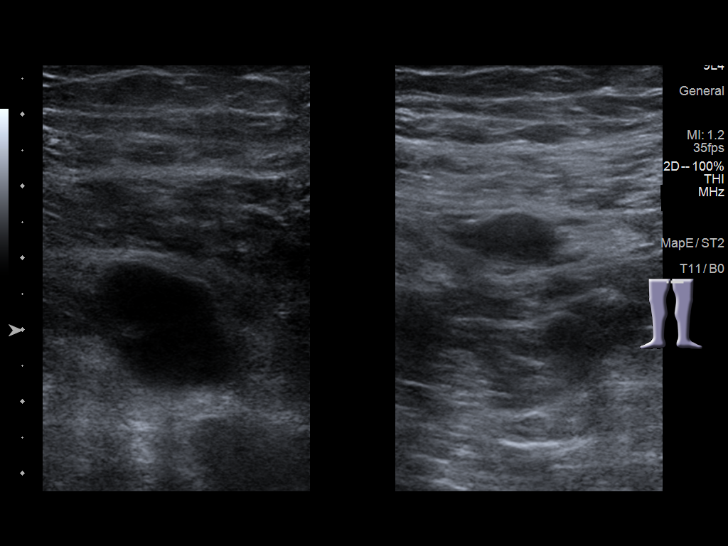
[im 17/33]
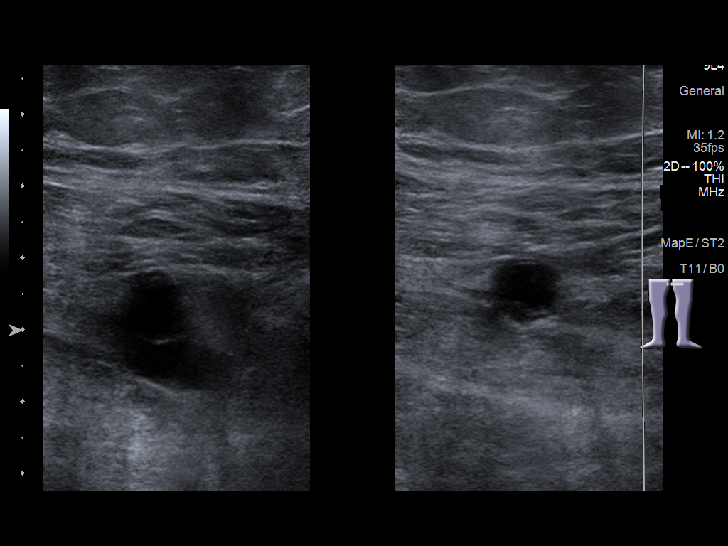
[im 19/33]
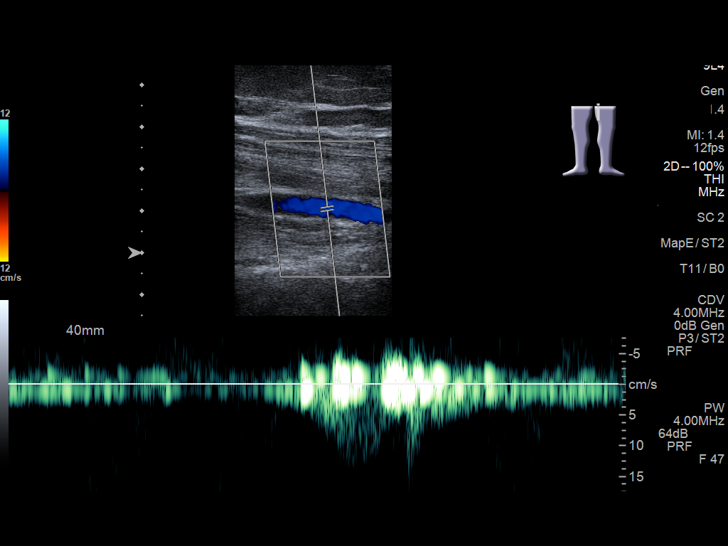
[im 21/33]
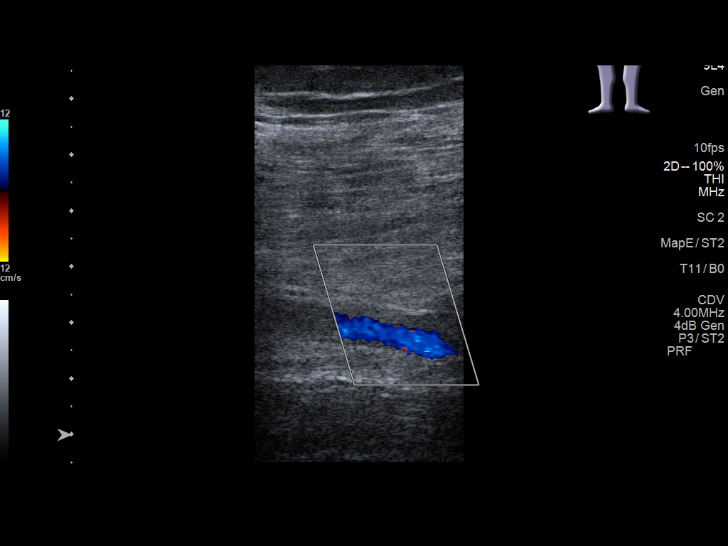
[im 24/33]
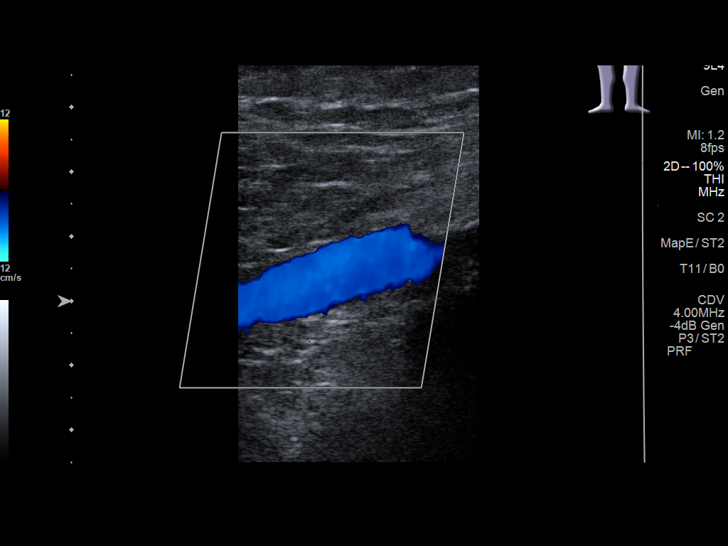
[im 27/33]
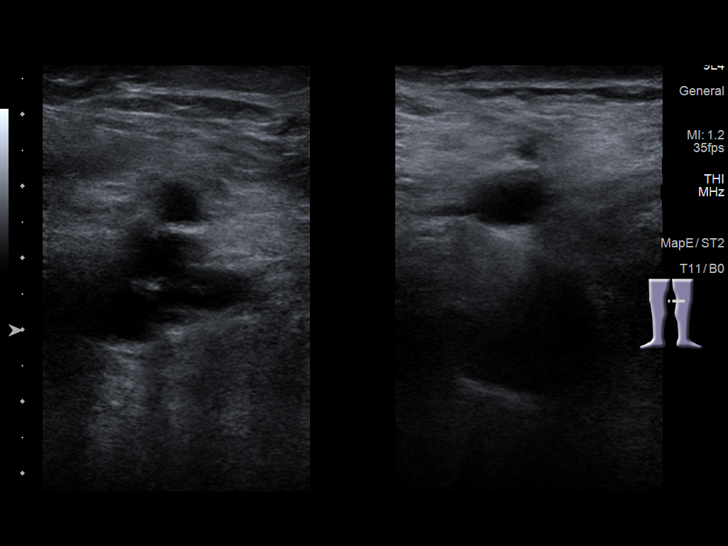
[im 30/33]
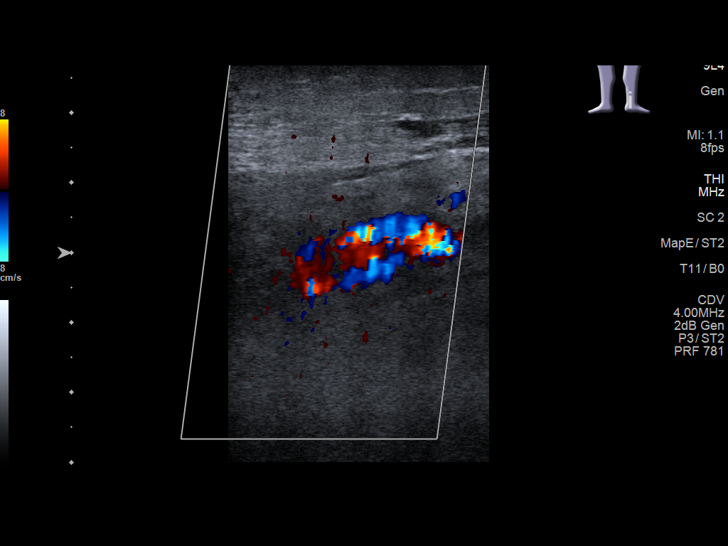
[im 33/33]
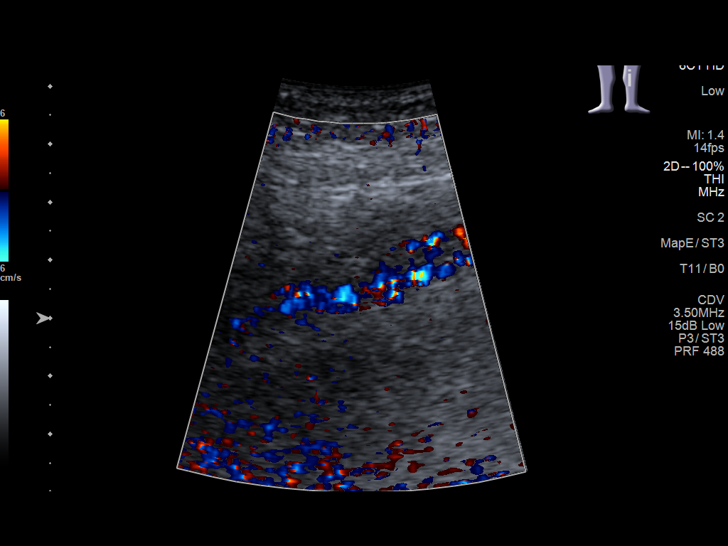

[13 of 24 positions shown; findings below may reference images not displayed]

FINDINGS: Contralateral Common Femoral Vein: Respiratory phasicity is normal
and symmetric with the symptomatic side. No evidence of thrombus.
Normal compressibility.

Common Femoral Vein: No evidence of thrombus. Normal
compressibility, respiratory phasicity and response to augmentation.

Saphenofemoral Junction: No evidence of thrombus. Normal
compressibility and flow on color Doppler imaging.

Profunda Femoral Vein: No evidence of thrombus. Normal
compressibility and flow on color Doppler imaging.

Femoral Vein: No evidence of thrombus. Normal compressibility,
respiratory phasicity and response to augmentation.

Popliteal Vein: No evidence of thrombus. Normal compressibility,
respiratory phasicity and response to augmentation.

Calf Veins: There is poor visualization of the tibial veins.

Superficial Great Saphenous Vein: No evidence of thrombus. Normal
compressibility.

Venous Reflux:  None.

Other Findings:  Edema is noted.
IMPRESSION: 1. No evidence for DVT within the popliteal vein consistent with
positive response to treatment.
2. Suboptimal evaluation of the tibial veins secondary to overlying
edema. A DVT at this level cannot be excluded on this exam.

## 2019-04-18 NOTE — Telephone Encounter (Signed)
Spoke to [place] at [facility] the order is correct and submitted for today at 4

## 2019-04-25 ENCOUNTER — Other Ambulatory Visit: Payer: Self-pay | Admitting: Family Medicine

## 2019-06-01 ENCOUNTER — Other Ambulatory Visit: Payer: Self-pay | Admitting: Family Medicine

## 2019-06-01 DIAGNOSIS — I10 Essential (primary) hypertension: Secondary | ICD-10-CM

## 2019-06-11 ENCOUNTER — Other Ambulatory Visit: Payer: Self-pay | Admitting: Family Medicine

## 2019-06-11 DIAGNOSIS — J4489 Other specified chronic obstructive pulmonary disease: Secondary | ICD-10-CM

## 2019-06-11 DIAGNOSIS — J449 Chronic obstructive pulmonary disease, unspecified: Secondary | ICD-10-CM

## 2019-09-13 ENCOUNTER — Other Ambulatory Visit: Payer: Self-pay | Admitting: Family Medicine

## 2019-09-13 DIAGNOSIS — I10 Essential (primary) hypertension: Secondary | ICD-10-CM

## 2019-09-18 ENCOUNTER — Encounter: Payer: Self-pay | Admitting: Family Medicine

## 2019-09-19 ENCOUNTER — Encounter: Payer: Self-pay | Admitting: Family Medicine

## 2019-09-19 ENCOUNTER — Ambulatory Visit (INDEPENDENT_AMBULATORY_CARE_PROVIDER_SITE_OTHER): Payer: BC Managed Care – PPO | Admitting: Family Medicine

## 2019-09-19 ENCOUNTER — Other Ambulatory Visit: Payer: Self-pay

## 2019-09-19 ENCOUNTER — Other Ambulatory Visit: Payer: Self-pay | Admitting: Family Medicine

## 2019-09-19 VITALS — BP 116/76 | HR 95 | Temp 98.1°F | Resp 20 | Ht 76.0 in | Wt 314.1 lb

## 2019-09-19 DIAGNOSIS — E1169 Type 2 diabetes mellitus with other specified complication: Secondary | ICD-10-CM

## 2019-09-19 DIAGNOSIS — E669 Obesity, unspecified: Secondary | ICD-10-CM

## 2019-09-19 DIAGNOSIS — J449 Chronic obstructive pulmonary disease, unspecified: Secondary | ICD-10-CM

## 2019-09-19 DIAGNOSIS — E1140 Type 2 diabetes mellitus with diabetic neuropathy, unspecified: Secondary | ICD-10-CM | POA: Diagnosis not present

## 2019-09-19 DIAGNOSIS — F418 Other specified anxiety disorders: Secondary | ICD-10-CM

## 2019-09-19 DIAGNOSIS — R6889 Other general symptoms and signs: Secondary | ICD-10-CM

## 2019-09-19 DIAGNOSIS — I10 Essential (primary) hypertension: Secondary | ICD-10-CM

## 2019-09-19 DIAGNOSIS — I700 Atherosclerosis of aorta: Secondary | ICD-10-CM | POA: Diagnosis not present

## 2019-09-19 DIAGNOSIS — Z96653 Presence of artificial knee joint, bilateral: Secondary | ICD-10-CM

## 2019-09-19 DIAGNOSIS — E1129 Type 2 diabetes mellitus with other diabetic kidney complication: Secondary | ICD-10-CM

## 2019-09-19 DIAGNOSIS — Z79899 Other long term (current) drug therapy: Secondary | ICD-10-CM

## 2019-09-19 DIAGNOSIS — J4489 Other specified chronic obstructive pulmonary disease: Secondary | ICD-10-CM

## 2019-09-19 DIAGNOSIS — G4733 Obstructive sleep apnea (adult) (pediatric): Secondary | ICD-10-CM

## 2019-09-19 DIAGNOSIS — E785 Hyperlipidemia, unspecified: Secondary | ICD-10-CM

## 2019-09-19 DIAGNOSIS — E2740 Unspecified adrenocortical insufficiency: Secondary | ICD-10-CM

## 2019-09-19 LAB — POCT GLYCOSYLATED HEMOGLOBIN (HGB A1C): Hemoglobin A1C: 6.7 % — AB (ref 4.0–5.6)

## 2019-09-19 MED ORDER — CITALOPRAM HYDROBROMIDE 20 MG PO TABS: 20.0000 mg | ORAL_TABLET | Freq: Every day | ORAL | 1 refills | Status: DC

## 2019-09-19 MED ORDER — LISINOPRIL 20 MG PO TABS: 20.0000 mg | ORAL_TABLET | Freq: Every day | ORAL | 0 refills | Status: DC

## 2019-09-19 MED ORDER — METFORMIN HCL 1000 MG PO TABS: 1000.0000 mg | ORAL_TABLET | Freq: Every day | ORAL | 0 refills | Status: DC

## 2019-09-19 NOTE — Progress Notes (Signed)
Name: Isaac Cross   MRN: [id number]    DOB: June 04, 1960   Date:09/19/2019       Progress Note  Subjective  Chief Complaint  Chief Complaint  Patient presents with  . Diabetes    management    HPI  S/P bilateral total knee Replacements: It was done by Dr. Harlow Mares on May 15th,2020 in [place] doing well, walking without a cane, states no pain on right knee, left knee still aches when more active than usual. No effusion, redness or increase in warmth   COPD/Asthma:usually mild and controlled,using Breo and denies cough, SOB or wheezing, doing well.Unchanged   OSA: he wears it every night CPAP machine, he denies waking up with headaches, snoring under control with CPAP.  DMII: He lost 30 lbs after knee surgery that was done on the  Summer of 2020 .Last A1C was down to 4.5, urine micro negative, previously A1C was 6.8% and 6.9% , we decreased Metformin from 1000 BID to 1000 once a day. She states she has not been checking his glucose until this past week when he felt dizzy in am's and noticed his fasting glucose has been higher than it used to be between 135/181. He denies polyphagia, polydipsia or polyuria. He states he only eats breakfast and lunch ( around 1 pm ) but recently started to drink a glass of wine - Merlot - in the evenings. Today A1C is 6.7 %. We discussed options, such as going up to Metformin BID or stop drinking wine every night. He chose the later   HTN:we changed from lisinopril from 40 mg to 20 mg and bp was still low so we decreased to 10 mg but he states bp started to go up at home, so he is back on 20 mg of lisinopril. He has noticed some dizziness over the past few days but did not check his bp, explained that it would be nice to know if bp is dropping and if remains below 120/80 he should cut the dose of lisinopril in half again   DVT: after knee surgery, switched to coumadin because of acute renal failure, but since discharged, kidney function is back to  normal, and tolerating Xarelto, no bleeding noted. DVT diagnosed in May , he completed course of Xarelto and is doing well   Atherosclerosis abdominal aorta: taking statin therapy, bp controlled, consider resuming aspirin daily   Adrenal insufficiency: size of adrenal glands improved still taking medication given by Dr. Honor Junes, however on lower dose now  Depression/anxiety: doing well on medication and does not want to stop it  Patient Active Problem List   Diagnosis Date Noted  . History of total knee arthroplasty 09/04/2018  . Coronary atherosclerosis due to calcified coronary lesion 12/02/2017  . Atherosclerosis of abdominal aorta ([place]) 06/17/2015  . Fatty liver disease, nonalcoholic 84/16/6063  . Renal cyst, right 06/17/2015  . BPH (benign prostatic hyperplasia) 05/25/2015  . Microscopic hematuria 05/25/2015  . Second degree hemorrhoids   . Diverticulosis of large intestine without diverticulitis   . Asthma, mild intermittent, well-controlled 10/16/2014  . Allergic rhinitis 10/13/2014  . ED (erectile dysfunction) of organic origin 07/22/2014  . Gastro-esophageal reflux disease without esophagitis 07/22/2014  . Benign hypertension 07/22/2014  . 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 Adult BMI 30+ 07/22/2014  . Chronic obstructive asthma ([place]) 07/22/2014  . Depression with anxiety 07/22/2014  . Dyslipidemia 07/22/2014  . Genital herpes 07/22/2014  . 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  Osteoarthritis of both knees 07/22/2014  . Obstructive apnea 05/14/2013  . 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 Blood in the urine 09/20/2006    Past Surgical History:  Procedure Laterality Date  . BLADDER REPAIR    . COLONOSCOPY WITH PROPOFOL N/A 04/07/2015   Procedure: COLONOSCOPY WITH PROPOFOL;  Surgeon: Lucilla Lame, MD;  Location: [facility];  Service: Endoscopy;  Laterality: N/A;  Diabetic - oral meds CPAP  . HAND SURGERY     4TH  AND 5TH FINGER  . I & D KNEE WITH POLY EXCHANGE Right 09/18/2018   Procedure: IRRIGATION AND DEBRIDEMENT KNEE WITH POLY EXCHANGE;  Surgeon: Lovell Sheehan, MD;  Location: [facility];  Service: Orthopedics;  Laterality: Right;  . KNEE ARTHROSCOPY Right   . 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 TOTAL KNEE ARTHROPLASTY Left 09/17/2018   Procedure: I &D left knee and polyethylene exchange;  Surgeon: Lovell Sheehan, MD;  Location: [facility];  Service: Orthopedics;  Laterality: Left;  Marland Kitchen VEIN SURGERY Right     Family History  Problem Relation Age of Onset  . Hypertension Mother   . Diabetes Mother   . Diabetes Father   . Hypertension Father   . Cancer Father   . Seizures Daughter   . Lupus Daughter   . Cancer Maternal Grandfather        Prostate  . Diabetes Paternal Grandfather   . Prostate cancer Maternal Uncle   . Bladder Cancer Neg Hx   . Kidney cancer Neg Hx     Social History   Tobacco Use  . Smoking status: Former Smoker    Packs/day: 1.00    Years: 10.00    Pack years: 10.00    Types: Cigarettes    Start date: 04/20/1995    Quit date: 04/19/2005    Years since quitting: 14.4  . Smokeless tobacco: Never Used  Substance Use Topics  . Alcohol use: Yes    Alcohol/week: 10.0 standard drinks    Types: 10 Cans of beer per week    Comment: occasional     Current Outpatient Medications:  .  acetaminophen (TYLENOL) 500 MG tablet, Take 1 tablet (500 mg total) by mouth every 6 (six) hours as needed., Disp: 90 tablet, Rfl: 0 .  acidophilus (RISAQUAD) CAPS capsule, Take 1 capsule by mouth daily., Disp: 30 capsule, Rfl: 0 .  albuterol (VENTOLIN HFA) 108 (90 Base) MCG/ACT inhaler, INHALE 2 PUFFS INTO THE LUNGS 4 (FOUR) TIMES DAILY., Disp: 18 g, Rfl: 2 .  cetirizine (ZYRTEC) 10 MG tablet, Take 10 mg by mouth daily., Disp: , Rfl:  .  citalopram (CELEXA) 20 MG tablet, Take 1 tablet (20 mg total) by mouth daily., Disp: 90 tablet, Rfl: 1 .  diclofenac sodium (VOLTAREN) 1 %  GEL, APPLY 4 GRAMS TOPICALLY FOUR TIMES A DAY (Patient taking differently: Apply 4 g topically 4 (four) times daily. ), Disp: 100 g, Rfl: 2 .  ferrous sulfate 325 (65 FE) MG EC tablet, Take 1 tablet (325 mg total) by mouth 3 (three) times daily with meals., Disp: 90 tablet, Rfl: 2 .  fludrocortisone (FLORINEF) 0.1 MG tablet, Take 1 tablet (0.1 mg total) by mouth 2 (two) times daily., Disp: 60 tablet, Rfl: 0 .  fluticasone furoate-vilanterol (BREO ELLIPTA) 100-25 MCG/INH AEPB, Inhale 1 puff into the lungs daily., Disp: 180 each, Rfl: 1 .  hydrocortisone (CORTEF) 20 MG tablet, Take 1 tablet (20 mg total) by mouth 2 (two) times daily., Disp: 60 tablet, Rfl:  0 .  lisinopril (ZESTRIL) 20 MG tablet, Take 1 tablet (20 mg total) by mouth daily., Disp: 90 tablet, Rfl: 0 .  metFORMIN (GLUCOPHAGE) 1000 MG tablet, Take 1 tablet (1,000 mg total) by mouth daily with breakfast., Disp: 90 tablet, Rfl: 0 .  Multiple Vitamins-Minerals (MENS MULTIVITAMIN PLUS) TABS, Take by mouth., Disp: , Rfl:  .  Omega-3 Fatty Acids (FISH OIL) 1000 MG CAPS, Take by mouth daily., Disp: , Rfl:  .  SENNA-PLUS 8.6-50 MG tablet, Take 2 tablets by mouth every morning., Disp: , Rfl:  .  triamcinolone cream (KENALOG) 0.1 %, Apply 1 application topically 2 (two) times daily as needed (skin irritations). , Disp: , Rfl:   Allergies  Allergen Reactions  . 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 Lipitor [Atorvastatin] Hives and Itching  . Other   . Viagra  [Sildenafil Citrate]     vision loss    I personally reviewed active problem list, medication list, allergies, family history, social history, health maintenance with the patient/caregiver today.   ROS  Constitutional: Negative for fever or weight change.  Respiratory: Negative for cough and shortness of breath.   Cardiovascular: Negative for chest pain or palpitations.  Gastrointestinal: Negative for abdominal pain, no bowel changes.  Musculoskeletal:  Negative for gait problem or joint swelling.  Skin: Negative for rash.  Neurological:positive for dizziness over the last few days when he gets up, but no headache.  No other specific complaints in a complete review of systems (except as listed in HPI above).  Objective  Vitals:   09/19/19 1246  BP: 116/76  Pulse: 95  Resp: 20  Temp: 98.1 F (36.7 C)  TempSrc: Temporal  SpO2: 98%  Weight: (!) 314 lb 1.6 oz (142.5 kg)  Height: 6\' 4"  (1.93 m)    Body mass index is 38.23 kg/m.  Physical Exam  Constitutional: Patient appears well-developed and well-nourished. Obese No distress.  HEENT: head atraumatic, normocephalic, pupils equal and reactive to light,  neck supple Cardiovascular: Normal rate, regular rhythm and normal heart sounds.  No murmur heard. Trace  BLE edema. Pulmonary/Chest: Effort normal and breath sounds normal. No respiratory distress. Abdominal: Soft.  There is no tenderness. Psychiatric: Patient has a normal mood and affect. behavior is normal. Judgment and thought content normal.  Recent Results (from the past 2160 hour(s))  POCT HgB A1C     Status: Abnormal   Collection Time: 09/19/19 12:16 PM  Result Value Ref Range   Hemoglobin A1C 6.7 (A) 4.0 - 5.6 %   HbA1c POC (<> result, manual entry)     HbA1c, POC (prediabetic range)     HbA1c, POC (controlled diabetic range)      Diabetic Foot Exam: Diabetic Foot Exam - Simple   Simple Foot Form Diabetic Foot exam was performed with the following findings: Yes 09/19/2019 11:40 AM  Visual Inspection See comments: Yes Sensation Testing Intact to touch and monofilament testing bilaterally: Yes Pulse Check Posterior Tibialis and Dorsalis pulse intact bilaterally: Yes Comments Dry skin, flat feet , thick and brittle toenails.       PHQ2/9: Depression screen [facility] 2/9 09/19/2019 03/13/2019 12/06/2018 11/14/2018 10/23/2018  Decreased Interest 0 0 0 0 0  Down, Depressed, Hopeless 0 0 0 0 0  PHQ - 2 Score 0 0 0 0 0   Altered sleeping 0 0 0 0 0  Tired, decreased energy 0 0 0 0 0  Change in appetite 0 0 0 0  0  Feeling bad or failure about yourself  0 0 0 0 0  Trouble concentrating 0 0 0 0 0  Moving slowly or fidgety/restless 0 0 0 0 0  Suicidal thoughts 0 0 0 0 0  PHQ-9 Score 0 0 0 0 0  Difficult doing work/chores - - - - -  Some recent data might be hidden    phq 9 is negative   Fall Risk: Fall Risk  09/19/2019 03/13/2019 12/06/2018 11/14/2018 10/23/2018  Falls in the past year? 0 0 0 0 0  Number falls in past yr: - 0 0 0 0  Injury with Fall? - 0 0 0 0    Functional Status Survey: Is the patient deaf or have difficulty hearing?: No Does the patient have difficulty seeing, even when wearing glasses/contacts?: No Does the patient have difficulty concentrating, remembering, or making decisions?: No Does the patient have difficulty walking or climbing stairs?: No Does the patient have difficulty dressing or bathing?: No Does the patient have difficulty doing errands alone such as visiting a doctor's office or shopping?: No    Assessment & Plan  1. Diabetes mellitus type 2 in obese (HCC)  - POCT HgB A1C - COMPLETE METABOLIC PANEL WITH GFR  2. Depression with anxiety  - citalopram (CELEXA) 20 MG tablet; Take 1 tablet (20 mg total) by mouth daily.  Dispense: 90 tablet; Refill: 1  3. Controlled type 2 diabetes mellitus with neuropathy (HCC)  - metFORMIN (GLUCOPHAGE) 1000 MG tablet; Take 1 tablet (1,000 mg total) by mouth daily with breakfast.  Dispense: 90 tablet; Refill: 0  4. Atherosclerosis of aorta (HCC)  - Lipid panel  5. Dyslipidemia  - Lipid panel  6. COPD with asthma ([place])  - CBC with Differential/Platelet  7. Adrenal insufficiency (HCC)  Seeing Dr. Honor Junes recently had medications adjusted to lower dose and bp has improved   8. Obstructive apnea   9. History of total bilateral knee replacement  Doing well   10. Cold intolerance  - TSH + free T4 - CBC with  Differential/Platelet  11. Long-term use of high-risk medication  - Vitamin B12  12. Type 2 diabetes mellitus with other diabetic kidney complication (HCC)  - metFORMIN (GLUCOPHAGE) 1000 MG tablet; Take 1 tablet (1,000 mg total) by mouth daily with breakfast.  Dispense: 90 tablet; Refill: 0  13. Benign hypertension  - lisinopril (ZESTRIL) 20 MG tablet; Take 1 tablet (20 mg total) by mouth daily.  Dispense: 90 tablet; Refill: 0

## 2019-09-20 ENCOUNTER — Other Ambulatory Visit: Payer: Self-pay | Admitting: Family Medicine

## 2019-09-20 DIAGNOSIS — E785 Hyperlipidemia, unspecified: Secondary | ICD-10-CM

## 2019-09-20 LAB — COMPLETE METABOLIC PANEL WITH GFR
AG Ratio: 1.5 (calc) (ref 1.0–2.5)
ALT: 19 U/L (ref 9–46)
AST: 21 U/L (ref 10–35)
Albumin: 4.3 g/dL (ref 3.6–5.1)
Alkaline phosphatase (APISO): 44 U/L (ref 35–144)
BUN: 10 mg/dL (ref 7–25)
CO2: 25 mmol/L (ref 20–32)
Calcium: 9.9 mg/dL (ref 8.6–10.3)
Chloride: 102 mmol/L (ref 98–110)
Creat: 1.08 mg/dL (ref 0.70–1.25)
GFR, Est African American: 85 mL/min/{1.73_m2} (ref >=60)
GFR, Est Non African American: 74 mL/min/{1.73_m2} (ref >=60)
Globulin: 2.8 g/dL (calc) (ref 1.9–3.7)
Glucose, Bld: 154 mg/dL — ABNORMAL HIGH (ref 65–99)
Potassium: 4.6 mmol/L (ref 3.5–5.3)
Sodium: 136 mmol/L (ref 135–146)
Total Bilirubin: 0.5 mg/dL (ref 0.2–1.2)
Total Protein: 7.1 g/dL (ref 6.1–8.1)

## 2019-09-20 LAB — LIPID PANEL
Cholesterol: 183 mg/dL (ref <200)
HDL: 37 mg/dL — ABNORMAL LOW (ref >=40)
LDL Cholesterol (Calc): 97 mg/dL (calc)
Non-HDL Cholesterol (Calc): 146 mg/dL (calc) — ABNORMAL HIGH (ref <130)
Total CHOL/HDL Ratio: 4.9 (calc) (ref <5.0)
Triglycerides: 366 mg/dL — ABNORMAL HIGH (ref <150)

## 2019-09-20 LAB — CBC WITH DIFFERENTIAL/PLATELET
Absolute Monocytes: 667 cells/uL (ref 200–950)
Basophils Absolute: 50 cells/uL (ref 0–200)
Basophils Relative: 0.7 %
Eosinophils Absolute: 43 cells/uL (ref 15–500)
Eosinophils Relative: 0.6 %
HCT: 41.7 % (ref 38.5–50.0)
Hemoglobin: 13.9 g/dL (ref 13.2–17.1)
Lymphs Abs: 1931 cells/uL (ref 850–3900)
MCH: 27.4 pg (ref 27.0–33.0)
MCHC: 33.3 g/dL (ref 32.0–36.0)
MCV: 82.1 fL (ref 80.0–100.0)
MPV: 10.6 fL (ref 7.5–12.5)
Monocytes Relative: 9.4 %
Neutro Abs: 4409 cells/uL (ref 1500–7800)
Neutrophils Relative %: 62.1 %
Platelets: 252 10*3/uL (ref 140–400)
RBC: 5.08 10*6/uL (ref 4.20–5.80)
RDW: 13.7 % (ref 11.0–15.0)
Total Lymphocyte: 27.2 %
WBC: 7.1 10*3/uL (ref 3.8–10.8)

## 2019-09-20 LAB — TSH+FREE T4: TSH W/REFLEX TO FT4: 0.97 mIU/L (ref 0.40–4.50)

## 2019-09-20 MED ORDER — ROSUVASTATIN CALCIUM 10 MG PO TABS: 10.0000 mg | ORAL_TABLET | Freq: Every day | ORAL | 0 refills | Status: DC

## 2019-10-16 ENCOUNTER — Other Ambulatory Visit: Payer: Self-pay | Admitting: Family Medicine

## 2019-10-16 DIAGNOSIS — E785 Hyperlipidemia, unspecified: Secondary | ICD-10-CM

## 2019-10-16 NOTE — Telephone Encounter (Signed)
Requested Prescriptions  Pending Prescriptions Disp Refills   rosuvastatin (CRESTOR) 10 MG tablet [Pharmacy Med Name: ROSUVASTATIN CALCIUM 10 MG TAB] 90 tablet 0    Sig: TAKE 1 TABLET BY MOUTH EVERY DAY     Cardiovascular:  Antilipid - Statins Failed - 10/16/2019  8:31 AM      Failed - HDL in normal range and within 360 days    HDL  Date Value Ref Range Status  09/19/2019 37 (L) > OR = 40 mg/dL Final  06/17/2015 40 >39 mg/dL Final         Failed - Triglycerides in normal range and within 360 days    Triglycerides  Date Value Ref Range Status  09/19/2019 366 (H) <150 mg/dL Final    Comment:    . If a non-fasting specimen was collected, consider repeat triglyceride testing on a fasting specimen if clinically indicated.  Yates Decamp et al. J. of Clin. Lipidol. 2248;2:500-370. Marland Kitchen          Passed - Total Cholesterol in normal range and within 360 days    Cholesterol, Total  Date Value Ref Range Status  06/17/2015 127 100 - 199 mg/dL Final   Cholesterol  Date Value Ref Range Status  09/19/2019 183 <200 mg/dL Final         Passed - LDL in normal range and within 360 days    LDL Cholesterol (Calc)  Date Value Ref Range Status  09/19/2019 97 mg/dL (calc) Final    Comment:    Reference range: <100 . Desirable range <100 mg/dL for primary prevention;   <70 mg/dL for patients with CHD or diabetic patients  with > or = 2 CHD risk factors. Marland Kitchen LDL-C is now calculated using the Martin-Hopkins  calculation, which is a validated novel method providing  better accuracy than the Friedewald equation in the  estimation of LDL-C.  Cresenciano Genre et al. Annamaria Helling. 4888;916(94): 2061-2068  (http://education.[employer].com/faq/FAQ164)          Passed - Patient is not pregnant      Passed - Valid encounter within last 12 months    Recent Outpatient Visits          3 weeks ago Diabetes mellitus type 2 in obese [facility])   [facility] Steele Sizer, MD   7 months ago Type  2 diabetes mellitus with microalbuminuria, without long-term current use of insulin [facility])   [facility] Steele Sizer, MD   10 months ago COPD with asthma [facility])   [facility] Steele Sizer, MD   11 months ago COPD with asthma [facility])   [facility] Steele Sizer, MD   11 months ago Mass of both adrenal glands [facility])   [facility] Steele Sizer, MD      Future Appointments            In 2 months Ancil Boozer, Drue Stager, MD [facility], [facility]

## 2019-11-27 ENCOUNTER — Telehealth: Payer: Self-pay

## 2019-11-27 NOTE — Telephone Encounter (Signed)
Contacted patient to schedule annual lung screening CT scan.  Patient agreeable and scan has been scheduled for Wednesday, Sept 22 at 11:15.  Patient's insurance has not changed since last scan.  Patient's current smoking status is non smoking.  Patient knows where to go for scan.

## 2019-11-28 ENCOUNTER — Other Ambulatory Visit: Payer: Self-pay | Admitting: *Deleted

## 2019-11-28 DIAGNOSIS — Z122 Encounter for screening for malignant neoplasm of respiratory organs: Secondary | ICD-10-CM

## 2019-11-28 DIAGNOSIS — Z87891 Personal history of nicotine dependence: Secondary | ICD-10-CM

## 2019-12-15 ENCOUNTER — Other Ambulatory Visit: Payer: Self-pay | Admitting: Family Medicine

## 2019-12-15 DIAGNOSIS — I10 Essential (primary) hypertension: Secondary | ICD-10-CM

## 2019-12-15 NOTE — Telephone Encounter (Signed)
Requested Prescriptions  Pending Prescriptions Disp Refills  . lisinopril (ZESTRIL) 20 MG tablet [Pharmacy Med Name: LISINOPRIL 20 MG TABLET] 90 tablet 0    Sig: TAKE 1 TABLET BY MOUTH EVERY DAY--MONITOR BP & IF STAYS BELOW 120/80 TAKE 1/2 OF A TABLET     Cardiovascular:  ACE Inhibitors Passed - 12/15/2019  8:32 AM      Passed - Cr in normal range and within 180 days    Creat  Date Value Ref Range Status  09/19/2019 1.08 0.70 - 1.25 mg/dL Final    Comment:    For patients >35 years of age, the reference limit for Creatinine is approximately 13% higher for people identified as African-American. .    Creatinine, POC  Date Value Ref Range Status  04/08/2017 neg mg/dL Corrected   Creatinine, Urine  Date Value Ref Range Status  12/06/2018 227 20 - 320 mg/dL Final         Passed - K in normal range and within 180 days    Potassium  Date Value Ref Range Status  09/19/2019 4.6 3.5 - 5.3 mmol/L Final         Passed - Patient is not pregnant      Passed - Last BP in normal range    BP Readings from Last 1 Encounters:  09/19/19 116/76         Passed - Valid encounter within last 6 months    Recent Outpatient Visits          2 months ago Diabetes mellitus type 2 in obese [facility])   [facility] Steele Sizer, MD   9 months ago Type 2 diabetes mellitus with microalbuminuria, without long-term current use of insulin [facility])   [facility] Steele Sizer, MD   1 year ago COPD with asthma [facility])   [facility] Steele Sizer, MD   1 year ago COPD with asthma [facility])   [facility] Steele Sizer, MD   1 year ago Mass of both adrenal glands [facility])   [facility] Steele Sizer, MD      Future Appointments            In 1 week Steele Sizer, MD [facility], [facility]

## 2019-12-25 NOTE — Progress Notes (Signed)
Name: Isaac Cross   MRN: [id number]    DOB: July 09, 1960   Date:12/26/2019       Progress Note  Subjective  Chief Complaint  Chief Complaint  Patient presents with  . Anxiety  . Depression  . Diabetes  . Hypertension  . Dyslipidemia    HPI  S/P bilateral total knee Replacements: It was done by Dr. Harlow Mares on May 15th,2020 in [place] doing well, walking without a cane, no longer having pain, going to the gym, doing weights and walks, feeling stronger.   COPD/Asthma:usually moderate and controlled, denies cough, SOB or wheezing. He states only having some sob when very hot and humid, only using Breo prn at this time, at most three times a week, explained it is a maintenance medication, he states forgets to use when not having symptoms.   OSA: he wears it every night CPAP machine, he denies waking up with headaches, he has been feeling rested when he gets up  DMII: He lost 30 lbs after knee surgery that was done on the  Summer of 2020 .Last A1C was down to 4.5, urine micro negative, previously A1C was 6.8%, 6.9% 6.7% and today is 6.8 %, since July he has been taking Metformin 1000 mg BID, he states eating breakfast buritos every morning , sandwich and chips for lunch, dinner at home , discussed cutting down on carbs. He does not drink sweet beverages He denies polyphagia, polydipsia or polyuria   HTN:he went down from 40 mg of lisinopril to 20 mg and bp is at goal, no dizziness, chest pain or palpitation   Atherosclerosis abdominal aorta: taking statintherapy, bp controlled, consider resuming aspirin daily   Adrenal insufficiency: size of adrenal glands improved still taking medication given by Dr. Honor Junes, taking medications as prescribed, denies fatigue   Depression/anxiety: doing well on medication and does not want to stop it , unchanged.    Patient Active Problem List   Diagnosis Date Noted  . History of total knee arthroplasty 09/04/2018  . Coronary  atherosclerosis due to calcified coronary lesion 12/02/2017  . Atherosclerosis of abdominal aorta ([place]) 06/17/2015  . Fatty liver disease, nonalcoholic 65/06/5463  . Renal cyst, right 06/17/2015  . BPH (benign prostatic hyperplasia) 05/25/2015  . Microscopic hematuria 05/25/2015  . Second degree hemorrhoids   . Diverticulosis of large intestine without diverticulitis   . Asthma, mild intermittent, well-controlled 10/16/2014  . Allergic rhinitis 10/13/2014  . ED (erectile dysfunction) of organic origin 07/22/2014  . Gastro-esophageal reflux disease without esophagitis 07/22/2014  . Benign hypertension 07/22/2014  . 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 Adult BMI 30+ 07/22/2014  . Chronic obstructive asthma ([place]) 07/22/2014  . Depression with anxiety 07/22/2014  . Dyslipidemia 07/22/2014  . Genital herpes 07/22/2014  . 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 Osteoarthritis of both knees 07/22/2014  . Obstructive apnea 05/14/2013  . 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 Blood in the urine 09/20/2006    Past Surgical History:  Procedure Laterality Date  . BLADDER REPAIR    . COLONOSCOPY WITH PROPOFOL N/A 04/07/2015   Procedure: COLONOSCOPY WITH PROPOFOL;  Surgeon: Lucilla Lame, MD;  Location: [facility];  Service: Endoscopy;  Laterality: N/A;  Diabetic - oral meds CPAP  . HAND SURGERY     4TH AND 5TH FINGER  . I & D KNEE WITH POLY EXCHANGE Right 09/18/2018   Procedure: IRRIGATION AND DEBRIDEMENT KNEE WITH POLY EXCHANGE;  Surgeon: Lovell Sheehan, MD;  Location:  [facility];  Service: Orthopedics;  Laterality: Right;  . KNEE ARTHROSCOPY Right   . 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 TOTAL KNEE ARTHROPLASTY Left 09/17/2018   Procedure: I &D left knee and polyethylene exchange;  Surgeon: Lovell Sheehan, MD;  Location: [facility];  Service: Orthopedics;  Laterality: Left;  Marland Kitchen VEIN SURGERY Right     Family History  Problem  Relation Age of Onset  . Hypertension Mother   . Diabetes Mother   . Diabetes Father   . Hypertension Father   . Cancer Father   . Seizures Daughter   . Lupus Daughter   . Cancer Maternal Grandfather        Prostate  . Diabetes Paternal Grandfather   . Prostate cancer Maternal Uncle   . Bladder Cancer Neg Hx   . Kidney cancer Neg Hx     Social History   Tobacco Use  . Smoking status: Former Smoker    Packs/day: 1.00    Years: 10.00    Pack years: 10.00    Types: Cigarettes    Start date: 04/20/1995    Quit date: 04/19/2005    Years since quitting: 14.6  . Smokeless tobacco: Never Used  Substance Use Topics  . Alcohol use: Yes    Alcohol/week: 10.0 standard drinks    Types: 10 Cans of beer per week    Comment: occasional     Current Outpatient Medications:  .  acetaminophen (TYLENOL) 500 MG tablet, Take 1 tablet (500 mg total) by mouth every 6 (six) hours as needed., Disp: 90 tablet, Rfl: 0 .  acidophilus (RISAQUAD) CAPS capsule, Take 1 capsule by mouth daily., Disp: 30 capsule, Rfl: 0 .  albuterol (VENTOLIN HFA) 108 (90 Base) MCG/ACT inhaler, INHALE 2 PUFFS INTO THE LUNGS 4 (FOUR) TIMES DAILY., Disp: 18 g, Rfl: 2 .  cetirizine (ZYRTEC) 10 MG tablet, Take 10 mg by mouth daily., Disp: , Rfl:  .  citalopram (CELEXA) 20 MG tablet, Take 1 tablet (20 mg total) by mouth daily., Disp: 90 tablet, Rfl: 1 .  diclofenac sodium (VOLTAREN) 1 % GEL, APPLY 4 GRAMS TOPICALLY FOUR TIMES A DAY (Patient taking differently: Apply 4 g topically 4 (four) times daily. ), Disp: 100 g, Rfl: 2 .  fludrocortisone (FLORINEF) 0.1 MG tablet, Take 1 tablet (0.1 mg total) by mouth 2 (two) times daily., Disp: 60 tablet, Rfl: 0 .  fluticasone furoate-vilanterol (BREO ELLIPTA) 100-25 MCG/INH AEPB, Inhale 1 puff into the lungs daily., Disp: 180 each, Rfl: 1 .  hydrocortisone (CORTEF) 20 MG tablet, Take 1 tablet (20 mg total) by mouth 2 (two) times daily., Disp: 60 tablet, Rfl: 0 .  lisinopril (ZESTRIL) 20 MG  tablet, TAKE 1 TABLET BY MOUTH EVERY DAY--MONITOR BP & IF STAYS BELOW 120/80 TAKE 1/2 OF A TABLET, Disp: 90 tablet, Rfl: 0 .  metFORMIN (GLUCOPHAGE) 1000 MG tablet, Take 1 tablet (1,000 mg total) by mouth daily with breakfast., Disp: 90 tablet, Rfl: 0 .  Multiple Vitamins-Minerals (MENS MULTIVITAMIN PLUS) TABS, Take by mouth., Disp: , Rfl:  .  Omega-3 Fatty Acids (FISH OIL) 1000 MG CAPS, Take by mouth daily., Disp: , Rfl:  .  rosuvastatin (CRESTOR) 10 MG tablet, TAKE 1 TABLET BY MOUTH EVERY DAY, Disp: 90 tablet, Rfl: 0 .  triamcinolone cream (KENALOG) 0.1 %, Apply 1 application topically 2 (two) times daily as needed (skin irritations). , Disp: , Rfl:  .  SENNA-PLUS 8.6-50 MG tablet, Take 2 tablets by mouth every morning. (Patient  not taking: Reported on 12/26/2019), Disp: , Rfl:   Allergies  Allergen Reactions  . 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 Lipitor [Atorvastatin] Hives and Itching  . Other   . Viagra  [Sildenafil Citrate]     vision loss    I personally reviewed active problem list, medication list, allergies, family history, social history, health maintenance with the patient/caregiver today.   ROS  Constitutional: Negative for fever or significant  weight change.  Respiratory: Negative for cough , he has intermittent  shortness of breath.   Cardiovascular: Negative for chest pain or palpitations.  Gastrointestinal: Negative for abdominal pain, no bowel changes.  Musculoskeletal: Negative for gait problem or joint swelling.  Skin: Negative for rash.  Neurological: Negative for dizziness or headache.  No other specific complaints in a complete review of systems (except as listed in HPI above).  Objective  Vitals:   12/26/19 0756  BP: 138/84  Pulse: (!) 106  Resp: 16  Temp: 98.3 F (36.8 C)  TempSrc: Oral  SpO2: 100%  Weight: (!) 317 lb 11.2 oz (144.1 kg)  Height: 6\' 4"  (1.93 m)    Body mass index is 38.67 kg/m.  Physical  Exam  Constitutional: Patient appears well-developed and well-nourished. Obese  No distress.  HEENT: head atraumatic, normocephalic, pupils equal and reactive to light,  neck supple Cardiovascular: Normal rate, regular rhythm and normal heart sounds.  No murmur heard. No BLE edema. Pulmonary/Chest: Effort normal and breath sounds normal. No respiratory distress. Abdominal: Soft.  There is no tenderness. Muscular skeletal: normal rom of knees  Psychiatric: Patient has a normal mood and affect. behavior is normal. Judgment and thought content normal.   PHQ2/9: Depression screen [facility] 2/9 12/26/2019 09/19/2019 03/13/2019 12/06/2018 11/14/2018  Decreased Interest 0 0 0 0 0  Down, Depressed, Hopeless 0 0 0 0 0  PHQ - 2 Score 0 0 0 0 0  Altered sleeping 0 0 0 0 0  Tired, decreased energy 0 0 0 0 0  Change in appetite 0 0 0 0 0  Feeling bad or failure about yourself  0 0 0 0 0  Trouble concentrating 0 0 0 0 0  Moving slowly or fidgety/restless 0 0 0 0 0  Suicidal thoughts 0 0 0 0 0  PHQ-9 Score 0 0 0 0 0  Difficult doing work/chores - - - - -  Some recent data might be hidden    phq 9 is negative   Fall Risk: Fall Risk  12/26/2019 09/19/2019 03/13/2019 12/06/2018 11/14/2018  Falls in the past year? 1 0 0 0 0  Number falls in past yr: 0 - 0 0 0  Injury with Fall? 0 - 0 0 0     Functional Status Survey: Is the patient deaf or have difficulty hearing?: No Does the patient have difficulty seeing, even when wearing glasses/contacts?: Yes Does the patient have difficulty concentrating, remembering, or making decisions?: No Does the patient have difficulty walking or climbing stairs?: No Does the patient have difficulty dressing or bathing?: No Does the patient have difficulty doing errands alone such as visiting a doctor's office or shopping?: Yes    Assessment & Plan  1. Diabetes mellitus type 2 in obese (HCC)  - POCT HgB A1C - metFORMIN (GLUCOPHAGE) 1000 MG tablet; Take 1 tablet (1,000 mg  total) by mouth 2 (two) times daily with a meal.  Dispense: 180 tablet; Refill: 1  2. Need for immunization against  influenza  - Flu Vaccine QUAD 36+ mos IM  3. Adrenal insufficiency (HCC)  Up to date with follow ups with Dr. Manfred Shirts   4. COPD with asthma ([place])  Discussed Breo daily   5. Depression with anxiety  Doing well on Celexa   6. Atherosclerosis of aorta (HCC)  Continue statin therapy   7. Dyslipidemia  - rosuvastatin (CRESTOR) 10 MG tablet; Take 1 tablet (10 mg total) by mouth daily.  Dispense: 90 tablet; Refill: 1  8. Controlled type 2 diabetes mellitus with neuropathy (HCC)  - metFORMIN (GLUCOPHAGE) 1000 MG tablet; Take 1 tablet (1,000 mg total) by mouth 2 (two) times daily with a meal.  Dispense: 180 tablet; Refill: 1  9. Obstructive apnea  Continue wearing CPAP daily   10. History of total bilateral knee replacement   11. Benign hypertension  - lisinopril (ZESTRIL) 20 MG tablet; Take 1 tablet (20 mg total) by mouth daily.  Dispense: 90 tablet; Refill: 0  12. Type 2 diabetes mellitus with other diabetic kidney complication (HCC)  - metFORMIN (GLUCOPHAGE) 1000 MG tablet; Take 1 tablet (1,000 mg total) by mouth 2 (two) times daily with a meal.  Dispense: 180 tablet; Refill: 1

## 2019-12-26 ENCOUNTER — Other Ambulatory Visit: Payer: Self-pay

## 2019-12-26 ENCOUNTER — Encounter: Payer: Self-pay | Admitting: Family Medicine

## 2019-12-26 ENCOUNTER — Ambulatory Visit: Payer: BC Managed Care – PPO | Admitting: Family Medicine

## 2019-12-26 VITALS — BP 138/84 | HR 106 | Temp 98.3°F | Resp 16 | Ht 76.0 in | Wt 317.7 lb

## 2019-12-26 DIAGNOSIS — E2740 Unspecified adrenocortical insufficiency: Secondary | ICD-10-CM | POA: Diagnosis not present

## 2019-12-26 DIAGNOSIS — I10 Essential (primary) hypertension: Secondary | ICD-10-CM

## 2019-12-26 DIAGNOSIS — E1169 Type 2 diabetes mellitus with other specified complication: Secondary | ICD-10-CM

## 2019-12-26 DIAGNOSIS — Z23 Encounter for immunization: Secondary | ICD-10-CM | POA: Diagnosis not present

## 2019-12-26 DIAGNOSIS — I700 Atherosclerosis of aorta: Secondary | ICD-10-CM

## 2019-12-26 DIAGNOSIS — G4733 Obstructive sleep apnea (adult) (pediatric): Secondary | ICD-10-CM

## 2019-12-26 DIAGNOSIS — E1129 Type 2 diabetes mellitus with other diabetic kidney complication: Secondary | ICD-10-CM

## 2019-12-26 DIAGNOSIS — E785 Hyperlipidemia, unspecified: Secondary | ICD-10-CM

## 2019-12-26 DIAGNOSIS — J4489 Other specified chronic obstructive pulmonary disease: Secondary | ICD-10-CM

## 2019-12-26 DIAGNOSIS — E1140 Type 2 diabetes mellitus with diabetic neuropathy, unspecified: Secondary | ICD-10-CM

## 2019-12-26 DIAGNOSIS — E669 Obesity, unspecified: Secondary | ICD-10-CM | POA: Diagnosis not present

## 2019-12-26 DIAGNOSIS — F418 Other specified anxiety disorders: Secondary | ICD-10-CM

## 2019-12-26 DIAGNOSIS — J449 Chronic obstructive pulmonary disease, unspecified: Secondary | ICD-10-CM

## 2019-12-26 DIAGNOSIS — Z96653 Presence of artificial knee joint, bilateral: Secondary | ICD-10-CM

## 2019-12-26 LAB — POCT GLYCOSYLATED HEMOGLOBIN (HGB A1C): Hemoglobin A1C: 6.8 % — AB (ref 4.0–5.6)

## 2019-12-26 MED ORDER — METFORMIN HCL 1000 MG PO TABS: 1000.0000 mg | ORAL_TABLET | Freq: Two times a day (BID) | ORAL | 1 refills | Status: DC

## 2019-12-26 MED ORDER — LISINOPRIL 20 MG PO TABS: 20.0000 mg | ORAL_TABLET | Freq: Every day | ORAL | 0 refills | Status: DC

## 2019-12-26 MED ORDER — ROSUVASTATIN CALCIUM 10 MG PO TABS: 10.0000 mg | ORAL_TABLET | Freq: Every day | ORAL | 1 refills | Status: DC

## 2020-01-09 ENCOUNTER — Ambulatory Visit
Admission: RE | Admit: 2020-01-09 | Discharge: 2020-01-09 | Disposition: A | Discharge status: Home / Self Care | Payer: BC Managed Care – PPO | Source: Ambulatory Visit | Attending: Nurse Practitioner | Admitting: Nurse Practitioner

## 2020-01-09 ENCOUNTER — Other Ambulatory Visit: Payer: Self-pay

## 2020-01-09 DIAGNOSIS — Z122 Encounter for screening for malignant neoplasm of respiratory organs: Secondary | ICD-10-CM | POA: Diagnosis present

## 2020-01-09 DIAGNOSIS — Z87891 Personal history of nicotine dependence: Secondary | ICD-10-CM | POA: Insufficient documentation

## 2020-01-09 IMAGING — CT CT CHEST LUNG CANCER SCREENING LOW DOSE W/O CM
2 of 5 series · 15 of 40 positions shown, 18 images · non-contrast
Comparison: Low-dose lung cancer screening chest CT [DATE].

CLINICAL DATA: 59-year-old male former smoker (quit 12 years ago)
with 30 pack-year history of smoking. Lung cancer screening
examination.

EXAM:
CT CHEST WITHOUT CONTRAST LOW-DOSE FOR LUNG CANCER SCREENING
TECHNIQUE: Multidetector CT imaging of the chest was performed following the
standard protocol without IV contrast.

[Series 3: lung 1.00 · axial · 0.77mm/px · z∈[-1370,-1047]mm · 12 of 357 slices shown, 15 images]
[im 17/357  mediastinal]
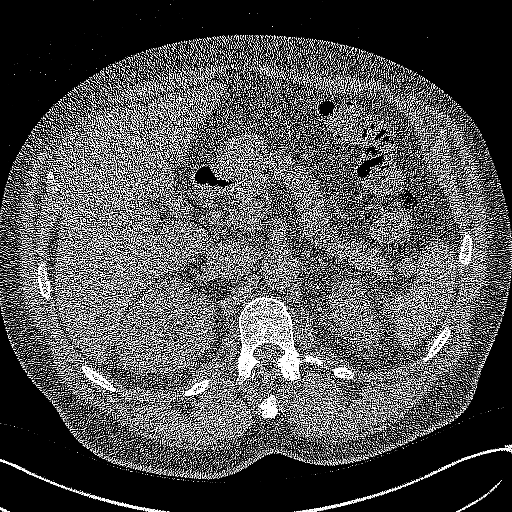
[im 17/357  lung]
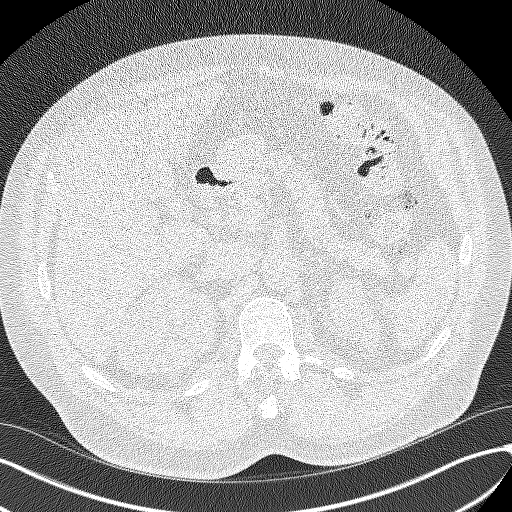
[im 51/357  lung]
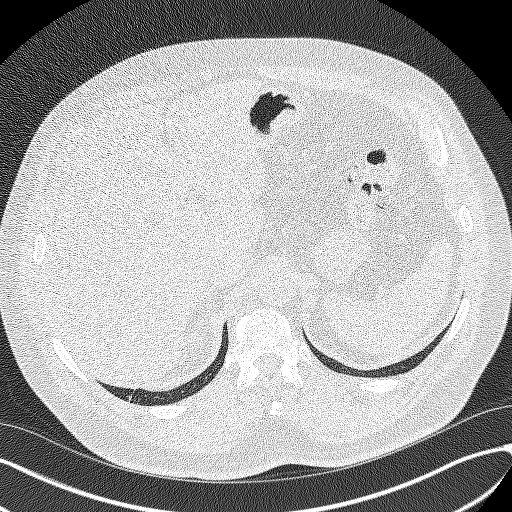
[im 85/357  lung]
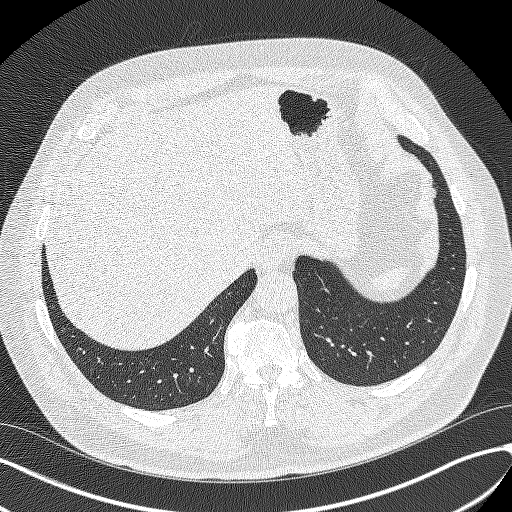
[im 102/357  lung]
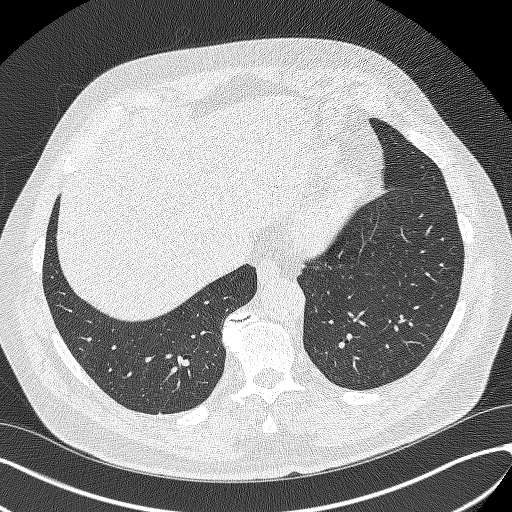
[im 136/357  mediastinal]
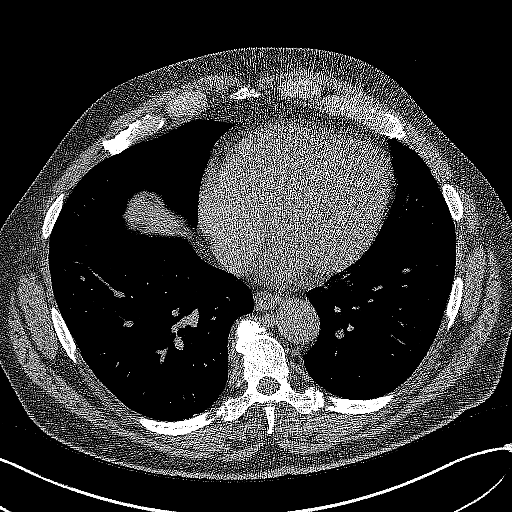
[im 136/357  lung]
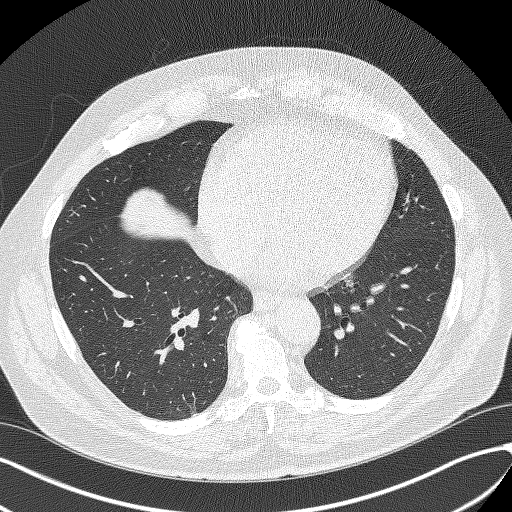
[im 170/357  lung]
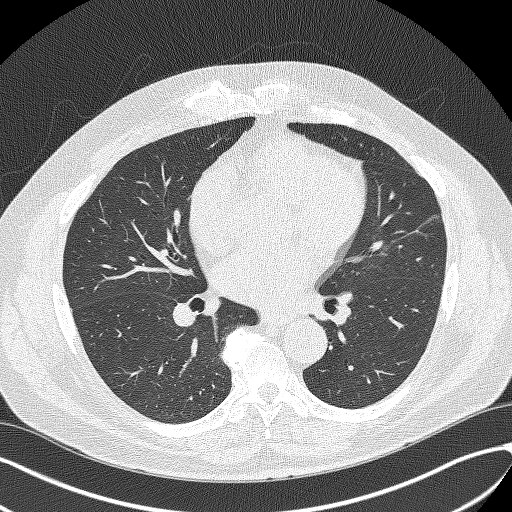
[im 187/357  lung]
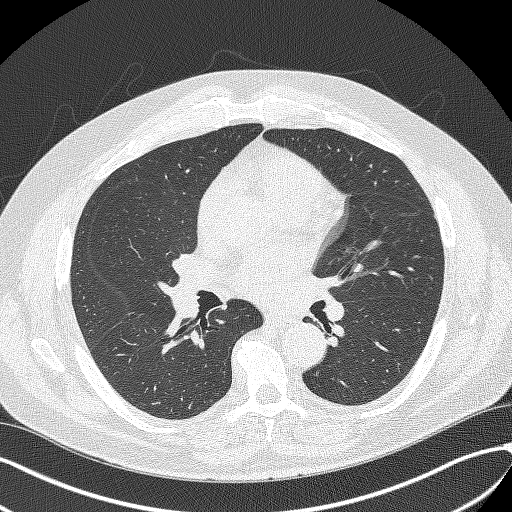
[im 221/357  lung]
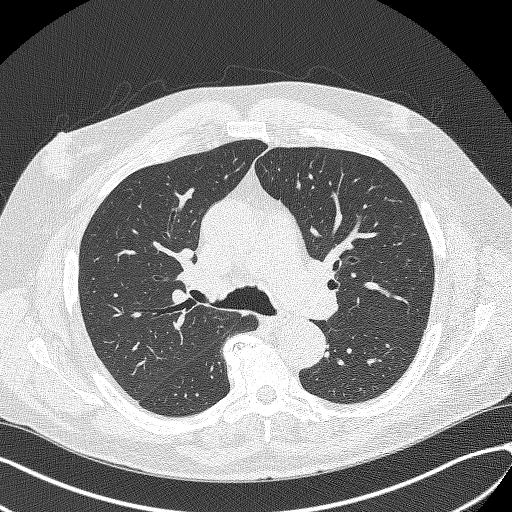
[im 255/357  mediastinal]
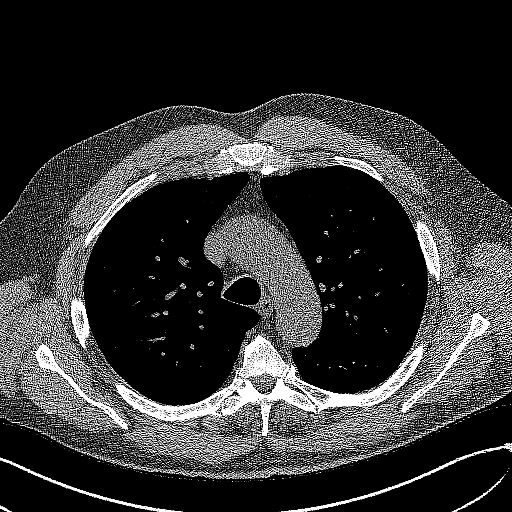
[im 255/357  lung]
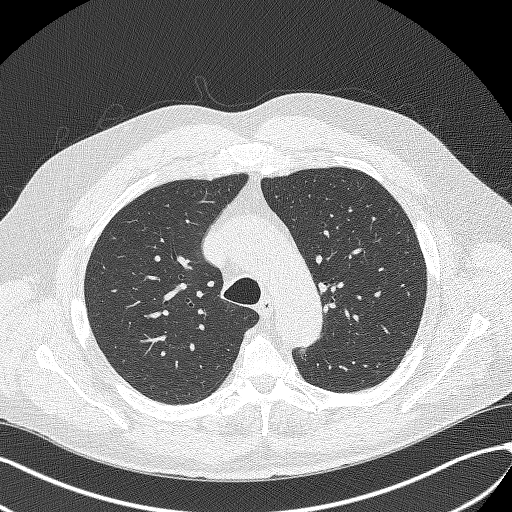
[im 272/357  lung]
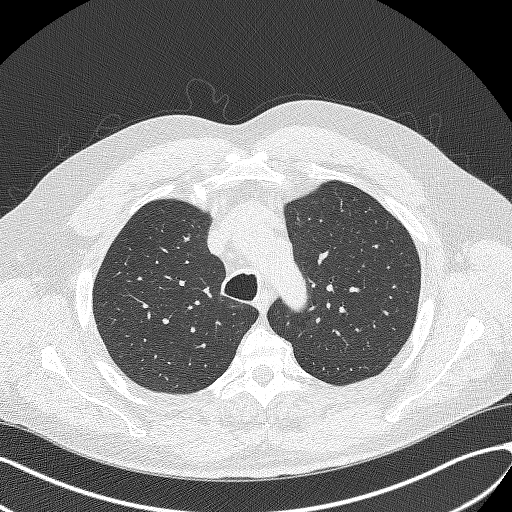
[im 306/357  lung]
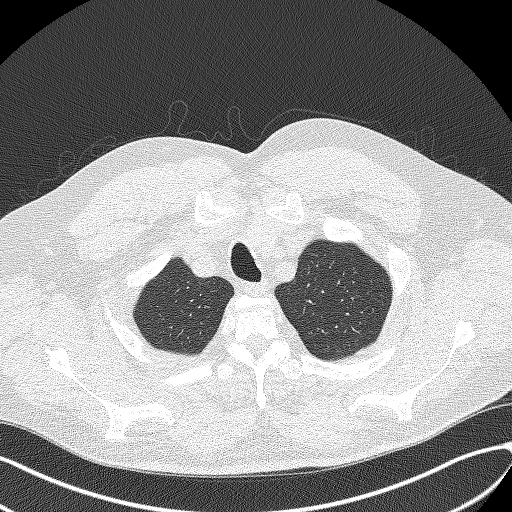
[im 340/357  lung]
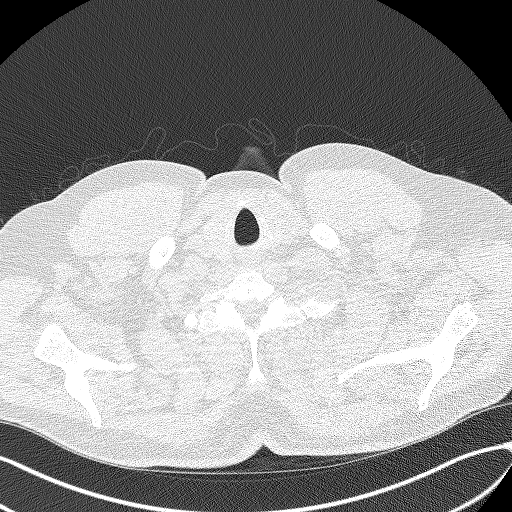

[Series 4: coronals lung 1.00 cor · coronal · 0.70mm/px · 3 of 339 slices shown]
[im 68/339  lung]
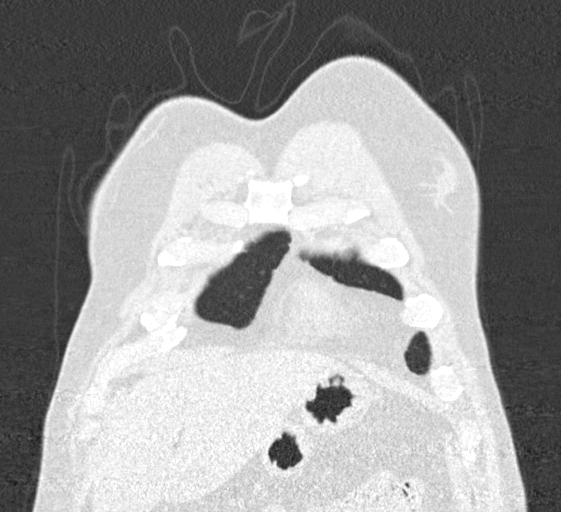
[im 136/339  lung]
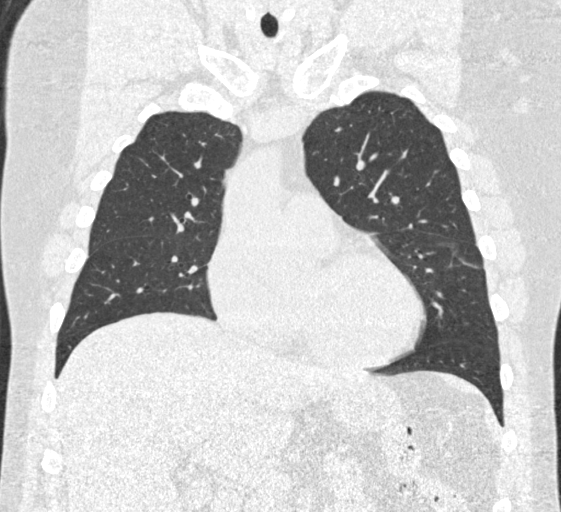
[im 203/339  lung]
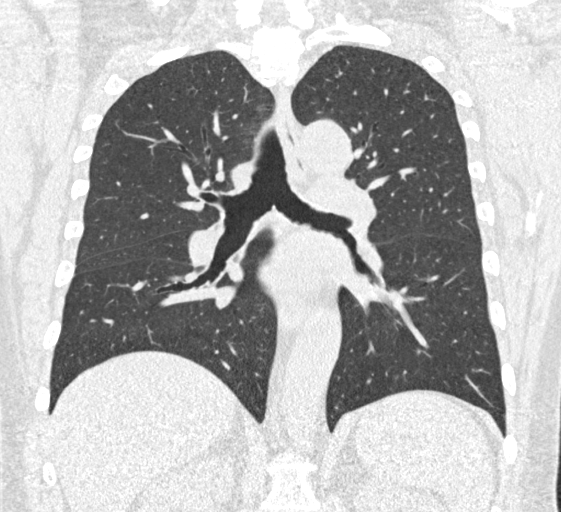

[15 of 40 positions shown; findings below may reference images not displayed]

FINDINGS: Cardiovascular: Heart size is normal. There is no significant
pericardial fluid, thickening or pericardial calcification. There is
aortic atherosclerosis, as well as atherosclerosis of the great
vessels of the mediastinum and the coronary arteries, including
calcified atherosclerotic plaque in the left anterior descending
coronary artery.

Mediastinum/Nodes: No pathologically enlarged mediastinal or hilar
lymph nodes. Please note that accurate exclusion of hilar adenopathy
is limited on noncontrast CT scans. Esophagus is unremarkable in
appearance. No axillary lymphadenopathy.

Lungs/Pleura: No suspicious appearing pulmonary nodules or masses
are noted. No acute consolidative airspace disease. No pleural
effusions.

Upper Abdomen: Unremarkable.

Musculoskeletal: There are no aggressive appearing lytic or blastic
lesions noted in the visualized portions of the skeleton.
IMPRESSION: 1. Lung-RADS 1S, negative. Continue annual screening with low-dose
chest CT without contrast in 12 months.
2. The "S" modifier above refers to potentially clinically
significant non lung cancer related findings. Specifically, there is
aortic atherosclerosis, in addition to left anterior descending
coronary artery disease. Please note that although the presence of
coronary artery calcium documents the presence of coronary artery
disease, the severity of this disease and any potential stenosis
cannot be assessed on this non-gated CT examination. Assessment for
potential risk factor modification, dietary therapy or pharmacologic
therapy may be warranted, if clinically indicated.

Aortic Atherosclerosis ([6H]-[6H]).

## 2020-01-13 ENCOUNTER — Encounter: Payer: Self-pay | Admitting: *Deleted

## 2020-03-26 NOTE — Progress Notes (Addendum)
Name: Isaac Cross   MRN: [id number]    DOB: November 06, 1960   Date:03/27/2020       Progress Note  Subjective  Chief Complaint  Follow Up  HPI  S/P bilateral total knee Replacements: It was done by Dr. Harlow Mares on May 15th,2020 in [place] doing well, walking without a cane, no longer having pain, going to the gym, doing weights and walks, feeling stronger, he has been released from Ortho care .   COPD/Asthma:usually moderate but currently under control with medication, he  denies cough, SOB or wheezing. He usually uses Breo a few times a week, but had a cold about two weeks ago and has been taking it daily since  Left ear problems: he states had a cold a couple of weeks ago, and has noticed some itching inside his ear canal, he has used peroxide otc , sweet oil and some otc ear drops, he states it  seems to be doing a little better now   OSA: he wears it every night CPAP machine, he denies waking up with headaches, he has been feeling rested when he gets up. Unchanged   DMII: He lost 30 lbs after knee surgery that was done on the  Summer of 2020, weight has been stable since last visit  .Last A1C was 6.8 %, he stopped eating breakfast burritos from fast food and is eating breakfast at home.  He is taking Metformin 1000 mg BID. We will check B12 level. He denies polyphagia, polydipsia or polyuria   HTN:he went down from 40 mg of lisinopril to 20 mg and bp is at goal, no dizziness, chest pain or palpitation . Advised to check bp at home a few times a week to see if we need to adjust dose   Atherosclerosis abdominal aorta: taking statintherapy, bp controlled, he is also on aspirin 81 mg   Adrenal insufficiency: size of adrenal glands improved still taking medication given by Dr. Honor Junes, taking medications as prescribed, denies fatigue Unchanged, reviewed last CT   Depression/anxiety: doing well on medication and does not want to stop it , unchanged. Phq 9 is negative     Patient Active Problem List   Diagnosis Date Noted  . History of total knee arthroplasty 09/04/2018  . Coronary atherosclerosis due to calcified coronary lesion 12/02/2017  . Atherosclerosis of abdominal aorta ([place]) 06/17/2015  . Fatty liver disease, nonalcoholic 93/90/3009  . Renal cyst, right 06/17/2015  . BPH (benign prostatic hyperplasia) 05/25/2015  . Microscopic hematuria 05/25/2015  . Second degree hemorrhoids   . Diverticulosis of large intestine without diverticulitis   . Asthma, mild intermittent, well-controlled 10/16/2014  . Allergic rhinitis 10/13/2014  . ED (erectile dysfunction) of organic origin 07/22/2014  . Gastro-esophageal reflux disease without esophagitis 07/22/2014  . Benign hypertension 07/22/2014  . 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 Adult BMI 30+ 07/22/2014  . Chronic obstructive asthma ([place]) 07/22/2014  . Depression with anxiety 07/22/2014  . Dyslipidemia 07/22/2014  . Genital herpes 07/22/2014  . 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 Osteoarthritis of both knees 07/22/2014  . Obstructive apnea 05/14/2013  . 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 Blood in the urine 09/20/2006    Past Surgical History:  Procedure Laterality Date  . BLADDER REPAIR    . COLONOSCOPY WITH PROPOFOL N/A 04/07/2015   Procedure: COLONOSCOPY WITH PROPOFOL;  Surgeon: Lucilla Lame, MD;  Location: [facility];  Service: Endoscopy;  Laterality: N/A;  Diabetic - oral meds CPAP  .  HAND SURGERY     4TH AND 5TH FINGER  . I & D KNEE WITH POLY EXCHANGE Right 09/18/2018   Procedure: IRRIGATION AND DEBRIDEMENT KNEE WITH POLY EXCHANGE;  Surgeon: Lovell Sheehan, MD;  Location: [facility];  Service: Orthopedics;  Laterality: Right;  . KNEE ARTHROSCOPY Right   . 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 TOTAL KNEE ARTHROPLASTY Left 09/17/2018   Procedure: I &D left knee and polyethylene exchange;  Surgeon: Lovell Sheehan, MD;   Location: [facility];  Service: Orthopedics;  Laterality: Left;  Marland Kitchen VEIN SURGERY Right     Family History  Problem Relation Age of Onset  . Hypertension Mother   . Diabetes Mother   . Diabetes Father   . Hypertension Father   . Cancer Father   . Seizures Daughter   . Lupus Daughter   . Cancer Maternal Grandfather        Prostate  . Diabetes Paternal Grandfather   . Prostate cancer Maternal Uncle   . Bladder Cancer Neg Hx   . Kidney cancer Neg Hx     Social History   Tobacco Use  . Smoking status: Former Smoker    Packs/day: 1.00    Years: 10.00    Pack years: 10.00    Types: Cigarettes    Start date: 04/20/1995    Quit date: 04/19/2005    Years since quitting: 14.9  . Smokeless tobacco: Never Used  Substance Use Topics  . Alcohol use: Yes    Alcohol/week: 10.0 standard drinks    Types: 10 Cans of beer per week    Comment: occasional     Current Outpatient Medications:  .  acetaminophen (TYLENOL) 500 MG tablet, Take 1 tablet (500 mg total) by mouth every 6 (six) hours as needed., Disp: 90 tablet, Rfl: 0 .  acidophilus (RISAQUAD) CAPS capsule, Take 1 capsule by mouth daily., Disp: 30 capsule, Rfl: 0 .  albuterol (VENTOLIN HFA) 108 (90 Base) MCG/ACT inhaler, INHALE 2 PUFFS INTO THE LUNGS 4 (FOUR) TIMES DAILY., Disp: 18 g, Rfl: 2 .  cetirizine (ZYRTEC) 10 MG tablet, Take 10 mg by mouth daily., Disp: , Rfl:  .  citalopram (CELEXA) 20 MG tablet, Take 1 tablet (20 mg total) by mouth daily., Disp: 90 tablet, Rfl: 1 .  diclofenac sodium (VOLTAREN) 1 % GEL, APPLY 4 GRAMS TOPICALLY FOUR TIMES A DAY (Patient taking differently: Apply 4 g topically 4 (four) times daily. ), Disp: 100 g, Rfl: 2 .  fludrocortisone (FLORINEF) 0.1 MG tablet, Take 1 tablet (0.1 mg total) by mouth 2 (two) times daily., Disp: 60 tablet, Rfl: 0 .  fluticasone furoate-vilanterol (BREO ELLIPTA) 100-25 MCG/INH AEPB, Inhale 1 puff into the lungs daily., Disp: 180 each, Rfl: 1 .  hydrocortisone (CORTEF) 20 MG tablet,  Take 1 tablet (20 mg total) by mouth 2 (two) times daily., Disp: 60 tablet, Rfl: 0 .  lisinopril (ZESTRIL) 20 MG tablet, Take 1 tablet (20 mg total) by mouth daily., Disp: 90 tablet, Rfl: 0 .  metFORMIN (GLUCOPHAGE) 1000 MG tablet, Take 1 tablet (1,000 mg total) by mouth 2 (two) times daily with a meal., Disp: 180 tablet, Rfl: 1 .  Multiple Vitamins-Minerals (MENS MULTIVITAMIN PLUS) TABS, Take by mouth., Disp: , Rfl:  .  Omega-3 Fatty Acids (FISH OIL) 1000 MG CAPS, Take by mouth daily., Disp: , Rfl:  .  rosuvastatin (CRESTOR) 10 MG tablet, Take 1 tablet (10 mg total) by mouth daily., Disp: 90 tablet, Rfl: 1 .  SENNA-PLUS 8.6-50 MG tablet, Take 2 tablets by mouth every morning., Disp: , Rfl:  .  triamcinolone cream (KENALOG) 0.1 %, Apply 1 application topically 2 (two) times daily as needed (skin irritations). , Disp: , Rfl:   Allergies  Allergen Reactions  . 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 Lipitor [Atorvastatin] Hives and Itching  . Other   . Viagra  [Sildenafil Citrate]     vision loss    I personally reviewed active problem list, medication list, allergies, family history, social history, health maintenance, notes from last encounter with the patient/caregiver today.   ROS  Constitutional: Negative for fever or weight change.  Respiratory: Negative for cough and shortness of breath.   Cardiovascular: Negative for chest pain or palpitations.  Gastrointestinal: Negative for abdominal pain, no bowel changes.  Musculoskeletal: Negative for gait problem or joint swelling.  Skin: Negative for rash.  Neurological: Negative for dizziness or headache.  No other specific complaints in a complete review of systems (except as listed in HPI above).  Objective  Vitals:   03/27/20 0923  BP: 134/82  Pulse: 97  Resp: 17  Temp: 98.1 F (36.7 C)  TempSrc: Oral  SpO2: 100%  Weight: (!) 317 lb 6.4 oz (144 kg)  Height: 6\' 4"  (1.93 m)    Body mass index  is 38.64 kg/m.  Physical Exam  Constitutional: Patient appears well-developed and well-nourished. Obese  No distress.  HEENT: head atraumatic, normocephalic, pupils equal and reactive to light, neck supple, ear canal is erythematous bilaterally normal TM Cardiovascular: Normal rate, regular rhythm and normal heart sounds.  No murmur heard. Trace  BLE edema. Pulmonary/Chest: Effort normal and breath sounds normal. No respiratory distress. Abdominal: Soft.  There is no tenderness. Psychiatric: Patient has a normal mood and affect. behavior is normal. Judgment and thought content normal.   Recent Results (from the past 2160 hour(s))  POCT HgB A1C     Status: Abnormal   Collection Time: 03/27/20  9:27 AM  Result Value Ref Range   Hemoglobin A1C 6.3 (A) 4.0 - 5.6 %   HbA1c POC (<> result, manual entry)     HbA1c, POC (prediabetic range)     HbA1c, POC (controlled diabetic range)       PHQ2/9: Depression screen [facility] 2/9 03/27/2020 12/26/2019 09/19/2019 03/13/2019 12/06/2018  Decreased Interest 0 0 0 0 0  Down, Depressed, Hopeless 0 0 0 0 0  PHQ - 2 Score 0 0 0 0 0  Altered sleeping 0 0 0 0 0  Tired, decreased energy 0 0 0 0 0  Change in appetite 0 0 0 0 0  Feeling bad or failure about yourself  0 0 0 0 0  Trouble concentrating 0 0 0 0 0  Moving slowly or fidgety/restless 0 0 0 0 0  Suicidal thoughts 0 0 0 0 0  PHQ-9 Score 0 0 0 0 0  Difficult doing work/chores - - - - -  Some recent data might be hidden    phq 9 is negative   Fall Risk: Fall Risk  03/27/2020 12/26/2019 09/19/2019 03/13/2019 12/06/2018  Falls in the past year? 0 1 0 0 0  Number falls in past yr: 0 0 - 0 0  Injury with Fall? 0 0 - 0 0     Functional Status Survey: Is the patient deaf or have difficulty hearing?: No Does the patient have difficulty seeing, even when wearing glasses/contacts?: No Does the patient  have difficulty concentrating, remembering, or making decisions?: No Does the patient have difficulty  walking or climbing stairs?: No Does the patient have difficulty dressing or bathing?: No Does the patient have difficulty doing errands alone such as visiting a doctor's office or shopping?: No    Assessment & Plan  1. Diabetes mellitus type 2 in obese (HCC)  - POCT HgB A1C  2. COPD with asthma ([place])   3. Adrenal insufficiency ([place])   4. Atherosclerosis of aorta (HCC)  - Lipid panel  5. Obstructive apnea   6. Benign hypertension  - COMPLETE METABOLIC PANEL WITH GFR - lisinopril (ZESTRIL) 20 MG tablet; Take 1 tablet (20 mg total) by mouth daily.  Dispense: 90 tablet; Refill: 0  7. History of total bilateral knee replacement   8. Mass of both adrenal glands ([place])   9. Controlled type 2 diabetes mellitus with neuropathy ([place])   10. Type 2 diabetes mellitus with microalbuminuria, without long-term current use of insulin (HCC)   11. Long-term use of high-risk medication  - COMPLETE METABOLIC PANEL WITH GFR - Vitamin B12  12. Depression with anxiety

## 2020-03-27 ENCOUNTER — Encounter: Payer: Self-pay | Admitting: Family Medicine

## 2020-03-27 ENCOUNTER — Ambulatory Visit: Payer: BC Managed Care – PPO | Admitting: Family Medicine

## 2020-03-27 ENCOUNTER — Other Ambulatory Visit: Payer: Self-pay

## 2020-03-27 VITALS — BP 134/82 | HR 97 | Temp 98.1°F | Resp 17 | Ht 76.0 in | Wt 317.4 lb

## 2020-03-27 DIAGNOSIS — I700 Atherosclerosis of aorta: Secondary | ICD-10-CM | POA: Diagnosis not present

## 2020-03-27 DIAGNOSIS — E278 Other specified disorders of adrenal gland: Secondary | ICD-10-CM

## 2020-03-27 DIAGNOSIS — E2740 Unspecified adrenocortical insufficiency: Secondary | ICD-10-CM

## 2020-03-27 DIAGNOSIS — J449 Chronic obstructive pulmonary disease, unspecified: Secondary | ICD-10-CM | POA: Diagnosis not present

## 2020-03-27 DIAGNOSIS — E1129 Type 2 diabetes mellitus with other diabetic kidney complication: Secondary | ICD-10-CM

## 2020-03-27 DIAGNOSIS — I10 Essential (primary) hypertension: Secondary | ICD-10-CM

## 2020-03-27 DIAGNOSIS — F418 Other specified anxiety disorders: Secondary | ICD-10-CM

## 2020-03-27 DIAGNOSIS — E669 Obesity, unspecified: Secondary | ICD-10-CM | POA: Diagnosis not present

## 2020-03-27 DIAGNOSIS — Z96653 Presence of artificial knee joint, bilateral: Secondary | ICD-10-CM

## 2020-03-27 DIAGNOSIS — E1169 Type 2 diabetes mellitus with other specified complication: Secondary | ICD-10-CM | POA: Diagnosis not present

## 2020-03-27 DIAGNOSIS — R809 Proteinuria, unspecified: Secondary | ICD-10-CM

## 2020-03-27 DIAGNOSIS — H60503 Unspecified acute noninfective otitis externa, bilateral: Secondary | ICD-10-CM

## 2020-03-27 DIAGNOSIS — E1140 Type 2 diabetes mellitus with diabetic neuropathy, unspecified: Secondary | ICD-10-CM

## 2020-03-27 DIAGNOSIS — Z79899 Other long term (current) drug therapy: Secondary | ICD-10-CM

## 2020-03-27 DIAGNOSIS — G4733 Obstructive sleep apnea (adult) (pediatric): Secondary | ICD-10-CM

## 2020-03-27 LAB — POCT GLYCOSYLATED HEMOGLOBIN (HGB A1C): Hemoglobin A1C: 6.3 % — AB (ref 4.0–5.6)

## 2020-03-27 MED ORDER — LISINOPRIL 20 MG PO TABS: 20.0000 mg | ORAL_TABLET | Freq: Every day | ORAL | 0 refills | Status: DC

## 2020-03-27 MED ORDER — HYDROCORTISONE-ACETIC ACID 1-2 % OT SOLN: 3.0000 [drp] | Freq: Two times a day (BID) | OTIC | 0 refills | Status: DC

## 2020-03-27 NOTE — Addendum Note (Signed)
Addended by: Steele Sizer F on: 03/27/2020 10:18 AM   Modules accepted: Orders

## 2020-03-28 LAB — COMPLETE METABOLIC PANEL WITH GFR
AG Ratio: 1.7 (calc) (ref 1.0–2.5)
ALT: 27 U/L (ref 9–46)
AST: 29 U/L (ref 10–35)
Albumin: 4.6 g/dL (ref 3.6–5.1)
Alkaline phosphatase (APISO): 44 U/L (ref 35–144)
BUN: 14 mg/dL (ref 7–25)
CO2: 27 mmol/L (ref 20–32)
Calcium: 10.1 mg/dL (ref 8.6–10.3)
Chloride: 101 mmol/L (ref 98–110)
Creat: 1.15 mg/dL (ref 0.70–1.25)
GFR, Est African American: 79 mL/min/{1.73_m2} (ref >=60)
GFR, Est Non African American: 68 mL/min/{1.73_m2} (ref >=60)
Globulin: 2.7 g/dL (calc) (ref 1.9–3.7)
Glucose, Bld: 123 mg/dL — ABNORMAL HIGH (ref 65–99)
Potassium: 4.6 mmol/L (ref 3.5–5.3)
Sodium: 137 mmol/L (ref 135–146)
Total Bilirubin: 0.4 mg/dL (ref 0.2–1.2)
Total Protein: 7.3 g/dL (ref 6.1–8.1)

## 2020-03-28 LAB — VITAMIN B12: Vitamin B-12: 399 pg/mL (ref 200–1100)

## 2020-03-28 LAB — LIPID PANEL
Cholesterol: 148 mg/dL (ref <200)
HDL: 48 mg/dL (ref >=40)
LDL Cholesterol (Calc): 73 mg/dL (calc)
Non-HDL Cholesterol (Calc): 100 mg/dL (calc) (ref <130)
Total CHOL/HDL Ratio: 3.1 (calc) (ref <5.0)
Triglycerides: 171 mg/dL — ABNORMAL HIGH (ref <150)

## 2020-05-15 ENCOUNTER — Other Ambulatory Visit: Payer: Self-pay | Admitting: Family Medicine

## 2020-05-15 DIAGNOSIS — F418 Other specified anxiety disorders: Secondary | ICD-10-CM

## 2020-07-10 ENCOUNTER — Other Ambulatory Visit: Payer: Self-pay | Admitting: Family Medicine

## 2020-07-10 DIAGNOSIS — I824Z2 Acute embolism and thrombosis of unspecified deep veins of left distal lower extremity: Secondary | ICD-10-CM

## 2020-07-15 ENCOUNTER — Other Ambulatory Visit: Payer: Self-pay | Admitting: Family Medicine

## 2020-07-15 DIAGNOSIS — E785 Hyperlipidemia, unspecified: Secondary | ICD-10-CM

## 2020-07-23 NOTE — Progress Notes (Signed)
Name: Isaac Cross   MRN: [id number]    DOB: 12/17/1960   Date:07/25/2020       Progress Note  Subjective  Chief Complaint  Follow up   HPI   S/P bilateral total knee Replacements: It was done by Dr. Harlow Mares on May 15th,2020 in [place] doing well, walking without a cane, no longer having pain, he has been going for walks. He uses Voltaren gel prn and needs a refill.   COPD/Asthma:usually moderate but currently under control with medication, he  denies cough, SOB or wheezing. He usually uses Breo every other day and controls symptoms. He will get COVID-19 booster soon   OSA: he wears it every night CPAP machine, he denies waking up with headaches, he has been feeling rested when he gets up. Unchanged   DMII: A1C was in the high 6 % but since resumed Metformin it is down to 6.2 % today  He is eating more at home, retired in March 2022   He is taking Metformin 1000 mg BID. He denies polyphagia, polydipsia or polyuria . He is due for urine micro. He has associated dyslipidemia, obesity , HTN   HTN:he is taking lisinopril 20 mg and bp is at goal, no dizziness, chest pain or palpitation .  Atherosclerosis abdominal aorta: taking statintherapy, bp controlled, he is also on aspirin 81 mg   Adrenal insufficiency: size of adrenal glands improved still taking medication given by Dr. Honor Junes, taking medications as prescribed, denies fatigue , rashes. He is on yearly follows up now   Depression/anxiety: doing well on medication and does not want to stop it , unchanged. Phq 9 is negative . Unchanged   Patient Active Problem List   Diagnosis Date Noted  . History of total knee arthroplasty 09/04/2018  . Coronary atherosclerosis due to calcified coronary lesion 12/02/2017  . Atherosclerosis of abdominal aorta ([place]) 06/17/2015  . Fatty liver disease, nonalcoholic 78/29/5621  . Renal cyst, right 06/17/2015  . BPH (benign prostatic hyperplasia) 05/25/2015  . Microscopic hematuria  05/25/2015  . Second degree hemorrhoids   . Diverticulosis of large intestine without diverticulitis   . Asthma, mild intermittent, well-controlled 10/16/2014  . Allergic rhinitis 10/13/2014  . ED (erectile dysfunction) of organic origin 07/22/2014  . Gastro-esophageal reflux disease without esophagitis 07/22/2014  . Benign hypertension 07/22/2014  . 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 Adult BMI 30+ 07/22/2014  . Chronic obstructive asthma ([place]) 07/22/2014  . Depression with anxiety 07/22/2014  . Dyslipidemia 07/22/2014  . Genital herpes 07/22/2014  . 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 Osteoarthritis of both knees 07/22/2014  . Obstructive apnea 05/14/2013  . 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 Blood in the urine 09/20/2006    Past Surgical History:  Procedure Laterality Date  . BLADDER REPAIR    . COLONOSCOPY WITH PROPOFOL N/A 04/07/2015   Procedure: COLONOSCOPY WITH PROPOFOL;  Surgeon: Lucilla Lame, MD;  Location: [facility];  Service: Endoscopy;  Laterality: N/A;  Diabetic - oral meds CPAP  . HAND SURGERY     4TH AND 5TH FINGER  . I & D KNEE WITH POLY EXCHANGE Right 09/18/2018   Procedure: IRRIGATION AND DEBRIDEMENT KNEE WITH POLY EXCHANGE;  Surgeon: Lovell Sheehan, MD;  Location: [facility];  Service: Orthopedics;  Laterality: Right;  . KNEE ARTHROSCOPY Right   . 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 TOTAL KNEE ARTHROPLASTY Left 09/17/2018   Procedure: I &D left knee and polyethylene exchange;  Surgeon: Lovell Sheehan, MD;  Location: [facility];  Service: Orthopedics;  Laterality: Left;  Marland Kitchen VEIN SURGERY Right     Family History  Problem Relation Age of Onset  . Hypertension Mother   . Diabetes Mother   . Diabetes Father   . Hypertension Father   . Cancer Father   . Seizures Daughter   . Lupus Daughter   . Cancer Maternal Grandfather        Prostate  . Diabetes Paternal Grandfather   . Prostate cancer  Maternal Uncle   . Bladder Cancer Neg Hx   . Kidney cancer Neg Hx     Social History   Tobacco Use  . Smoking status: Former Smoker    Packs/day: 1.00    Years: 10.00    Pack years: 10.00    Types: Cigarettes    Start date: 04/20/1995    Quit date: 04/19/2005    Years since quitting: 15.2  . Smokeless tobacco: Never Used  Substance Use Topics  . Alcohol use: Yes    Alcohol/week: 10.0 standard drinks    Types: 10 Cans of beer per week    Comment: occasional     Current Outpatient Medications:  .  acetaminophen (TYLENOL) 500 MG tablet, Take 1 tablet (500 mg total) by mouth every 6 (six) hours as needed., Disp: 90 tablet, Rfl: 0 .  acetic acid-hydrocortisone (VOSOL-HC) OTIC solution, Place 3 drops into both ears 2 (two) times daily., Disp: 10 mL, Rfl: 0 .  acidophilus (RISAQUAD) CAPS capsule, Take 1 capsule by mouth daily., Disp: 30 capsule, Rfl: 0 .  albuterol (VENTOLIN HFA) 108 (90 Base) MCG/ACT inhaler, INHALE 2 PUFFS INTO THE LUNGS 4 (FOUR) TIMES DAILY., Disp: 18 g, Rfl: 2 .  cetirizine (ZYRTEC) 10 MG tablet, Take 10 mg by mouth daily., Disp: , Rfl:  .  citalopram (CELEXA) 20 MG tablet, TAKE 1 TABLET BY MOUTH EVERY DAY, Disp: 90 tablet, Rfl: 1 .  diclofenac Sodium (VOLTAREN) 1 % GEL, Apply 4 g topically 4 (four) times daily., Disp: 300 g, Rfl: 1 .  fludrocortisone (FLORINEF) 0.1 MG tablet, Take 1 tablet (0.1 mg total) by mouth 2 (two) times daily., Disp: 60 tablet, Rfl: 0 .  hydrocortisone (CORTEF) 20 MG tablet, Take 1 tablet (20 mg total) by mouth 2 (two) times daily., Disp: 60 tablet, Rfl: 0 .  Multiple Vitamins-Minerals (MENS MULTIVITAMIN PLUS) TABS, Take by mouth., Disp: , Rfl:  .  Omega-3 Fatty Acids (FISH OIL) 1000 MG CAPS, Take by mouth daily., Disp: , Rfl:  .  SENNA-PLUS 8.6-50 MG tablet, Take 2 tablets by mouth every morning., Disp: , Rfl:  .  fluticasone furoate-vilanterol (BREO ELLIPTA) 100-25 MCG/INH AEPB, Inhale 1 puff into the lungs daily., Disp: 180 each, Rfl: 1 .   lisinopril (ZESTRIL) 20 MG tablet, Take 1 tablet (20 mg total) by mouth daily., Disp: 90 tablet, Rfl: 1 .  metFORMIN (GLUCOPHAGE) 1000 MG tablet, Take 1 tablet (1,000 mg total) by mouth 2 (two) times daily with a meal., Disp: 180 tablet, Rfl: 1 .  rosuvastatin (CRESTOR) 10 MG tablet, Take 1 tablet (10 mg total) by mouth daily., Disp: 90 tablet, Rfl: 1  Allergies  Allergen Reactions  . 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 Lipitor [Atorvastatin] Hives and Itching  . Other   . Viagra  [Sildenafil Citrate]     vision loss    I personally reviewed active problem list, medication list, allergies, family history,  social history, health maintenance with the patient/caregiver today.   ROS  Constitutional: Negative for fever , positive for weight change.  Respiratory: Negative for cough and shortness of breath.   Cardiovascular: Negative for chest pain or palpitations.  Gastrointestinal: Negative for abdominal pain, no bowel changes.  Musculoskeletal: Negative  for gait problem or joint swelling.  Skin: Negative for rash.  Neurological: Negative for dizziness or headache.  No other specific complaints in a complete review of systems (except as listed in HPI above).  Objective  Vitals:   07/25/20 0832  BP: 138/82  Pulse: 89  Resp: 16  Temp: 98.4 F (36.9 C)  TempSrc: Oral  SpO2: 99%  Weight: (!) 328 lb (148.8 kg)  Height: 6\' 4"  (1.93 m)    Body mass index is 39.93 kg/m.  Physical Exam  Constitutional: Patient appears well-developed and well-nourished. Obese  No distress.  HEENT: head atraumatic, normocephalic, pupils equal and reactive to light,  neck supple Cardiovascular: Normal rate, regular rhythm and normal heart sounds.  No murmur heard. No BLE edema. Pulmonary/Chest: Effort normal and breath sounds normal. No respiratory distress. Abdominal: Soft.  There is no tenderness. Muscular skeletal: grinding with extension of both knees   Psychiatric: Patient has a normal mood and affect. behavior is normal. Judgment and thought content normal.  Recent Results (from the past 2160 hour(s))  POCT HgB A1C     Status: Abnormal   Collection Time: 07/25/20  8:37 AM  Result Value Ref Range   Hemoglobin A1C 6.2 (A) 4.0 - 5.6 %   HbA1c POC (<> result, manual entry)     HbA1c, POC (prediabetic range)     HbA1c, POC (controlled diabetic range)        PHQ2/9: Depression screen [facility] 2/9 07/25/2020 03/27/2020 12/26/2019 09/19/2019 03/13/2019  Decreased Interest 0 0 0 0 0  Down, Depressed, Hopeless 0 0 0 0 0  PHQ - 2 Score 0 0 0 0 0  Altered sleeping 0 0 0 0 0  Tired, decreased energy 0 0 0 0 0  Change in appetite 0 0 0 0 0  Feeling bad or failure about yourself  0 0 0 0 0  Trouble concentrating 0 0 0 0 0  Moving slowly or fidgety/restless 0 0 0 0 0  Suicidal thoughts 0 0 0 0 0  PHQ-9 Score 0 0 0 0 0  Difficult doing work/chores - - - - -  Some recent data might be hidden    phq 9 is negative   Fall Risk: Fall Risk  07/25/2020 03/27/2020 12/26/2019 09/19/2019 03/13/2019  Falls in the past year? 0 0 1 0 0  Number falls in past yr: 0 0 0 - 0  Injury with Fall? 0 0 0 - 0     Functional Status Survey: Is the patient deaf or have difficulty hearing?: Yes Does the patient have difficulty seeing, even when wearing glasses/contacts?: No Does the patient have difficulty concentrating, remembering, or making decisions?: No Does the patient have difficulty walking or climbing stairs?: No Does the patient have difficulty dressing or bathing?: No Does the patient have difficulty doing errands alone such as visiting a doctor's office or shopping?: No    Assessment & Plan  1. Diabetes mellitus type 2 in obese (HCC)  - POCT HgB A1C - metFORMIN (GLUCOPHAGE) 1000 MG tablet; Take 1 tablet (1,000 mg total) by mouth 2 (two) times daily with a meal.  Dispense: 180 tablet; Refill: 1 - POCT UA - Microalbumin  2. COPD with asthma ([place])  -  fluticasone furoate-vilanterol (BREO ELLIPTA) 100-25 MCG/INH AEPB; Inhale 1 puff into the lungs daily.  Dispense: 180 each; Refill: 1  3. Atherosclerosis of aorta ([place])   4. Adrenal insufficiency ([place])   5. Mass of both adrenal glands ([place])   6. Benign hypertension  - lisinopril (ZESTRIL) 20 MG tablet; Take 1 tablet (20 mg total) by mouth daily.  Dispense: 90 tablet; Refill: 1  7. Obstructive apnea   8. Controlled type 2 diabetes mellitus with neuropathy (HCC)  - metFORMIN (GLUCOPHAGE) 1000 MG tablet; Take 1 tablet (1,000 mg total) by mouth 2 (two) times daily with a meal.  Dispense: 180 tablet; Refill: 1  9. Depression with anxiety   10. Dyslipidemia  - rosuvastatin (CRESTOR) 10 MG tablet; Take 1 tablet (10 mg total) by mouth daily.  Dispense: 90 tablet; Refill: 1  11. History of knee replacement, total, bilateral  - diclofenac Sodium (VOLTAREN) 1 % GEL; Apply 4 g topically 4 (four) times daily.  Dispense: 300 g; Refill: 1  12. Type 2 diabetes mellitus with other diabetic kidney complication (HCC)  - metFORMIN (GLUCOPHAGE) 1000 MG tablet; Take 1 tablet (1,000 mg total) by mouth 2 (two) times daily with a meal.  Dispense: 180 tablet; Refill: 1

## 2020-07-25 ENCOUNTER — Encounter: Payer: Self-pay | Admitting: Family Medicine

## 2020-07-25 ENCOUNTER — Ambulatory Visit: Payer: BC Managed Care – PPO | Admitting: Family Medicine

## 2020-07-25 ENCOUNTER — Other Ambulatory Visit: Payer: Self-pay

## 2020-07-25 VITALS — BP 138/82 | HR 89 | Temp 98.4°F | Resp 16 | Ht 76.0 in | Wt 328.0 lb

## 2020-07-25 DIAGNOSIS — I10 Essential (primary) hypertension: Secondary | ICD-10-CM

## 2020-07-25 DIAGNOSIS — E785 Hyperlipidemia, unspecified: Secondary | ICD-10-CM

## 2020-07-25 DIAGNOSIS — J4489 Other specified chronic obstructive pulmonary disease: Secondary | ICD-10-CM

## 2020-07-25 DIAGNOSIS — I700 Atherosclerosis of aorta: Secondary | ICD-10-CM | POA: Diagnosis not present

## 2020-07-25 DIAGNOSIS — E669 Obesity, unspecified: Secondary | ICD-10-CM

## 2020-07-25 DIAGNOSIS — J449 Chronic obstructive pulmonary disease, unspecified: Secondary | ICD-10-CM | POA: Diagnosis not present

## 2020-07-25 DIAGNOSIS — E1169 Type 2 diabetes mellitus with other specified complication: Secondary | ICD-10-CM | POA: Diagnosis not present

## 2020-07-25 DIAGNOSIS — E1129 Type 2 diabetes mellitus with other diabetic kidney complication: Secondary | ICD-10-CM

## 2020-07-25 DIAGNOSIS — E2740 Unspecified adrenocortical insufficiency: Secondary | ICD-10-CM | POA: Diagnosis not present

## 2020-07-25 DIAGNOSIS — E1140 Type 2 diabetes mellitus with diabetic neuropathy, unspecified: Secondary | ICD-10-CM

## 2020-07-25 DIAGNOSIS — E278 Other specified disorders of adrenal gland: Secondary | ICD-10-CM

## 2020-07-25 DIAGNOSIS — G4733 Obstructive sleep apnea (adult) (pediatric): Secondary | ICD-10-CM

## 2020-07-25 DIAGNOSIS — Z96653 Presence of artificial knee joint, bilateral: Secondary | ICD-10-CM

## 2020-07-25 DIAGNOSIS — F418 Other specified anxiety disorders: Secondary | ICD-10-CM

## 2020-07-25 LAB — POCT GLYCOSYLATED HEMOGLOBIN (HGB A1C): Hemoglobin A1C: 6.2 % — AB (ref 4.0–5.6)

## 2020-07-25 MED ORDER — METFORMIN HCL 1000 MG PO TABS: 1000.0000 mg | ORAL_TABLET | Freq: Two times a day (BID) | ORAL | 1 refills | Status: DC

## 2020-07-25 MED ORDER — BREO ELLIPTA 100-25 MCG/INH IN AEPB: 1.0000 | INHALATION_SPRAY | Freq: Every day | RESPIRATORY_TRACT | 1 refills | Status: DC

## 2020-07-25 MED ORDER — ROSUVASTATIN CALCIUM 10 MG PO TABS: 10.0000 mg | ORAL_TABLET | Freq: Every day | ORAL | 1 refills | Status: DC

## 2020-07-25 MED ORDER — LISINOPRIL 20 MG PO TABS: 20.0000 mg | ORAL_TABLET | Freq: Every day | ORAL | 1 refills | Status: DC

## 2020-07-25 MED ORDER — DICLOFENAC SODIUM 1 % EX GEL
4.0000 g | Freq: Four times a day (QID) | CUTANEOUS | 1 refills | Status: AC
Start: 2020-07-25 — End: ?

## 2020-07-26 LAB — MICROALBUMIN, URINE: Microalb, Ur: 9.1 mg/dL

## 2020-09-03 LAB — HM DIABETES EYE EXAM

## 2020-11-09 ENCOUNTER — Other Ambulatory Visit: Payer: Self-pay | Admitting: Family Medicine

## 2020-11-09 DIAGNOSIS — F418 Other specified anxiety disorders: Secondary | ICD-10-CM

## 2020-11-09 NOTE — Telephone Encounter (Signed)
Requested Prescriptions  Pending Prescriptions Disp Refills  . citalopram (CELEXA) 20 MG tablet [Pharmacy Med Name: CITALOPRAM HBR 20 MG TABLET] 30 tablet 0    Sig: TAKE 1 TABLET BY MOUTH EVERY DAY     Psychiatry:  Antidepressants - SSRI Passed - 11/09/2020  9:20 AM      Passed - Completed PHQ-2 or PHQ-9 in the last 360 days      Passed - Valid encounter within last 6 months    Recent Outpatient Visits          3 months ago Diabetes mellitus type 2 in obese [facility])   [facility], Drue Stager, MD   7 months ago Diabetes mellitus type 2 in obese [facility])   [facility], Drue Stager, MD   10 months ago Diabetes mellitus type 2 in obese [facility])   [facility] Steele Sizer, MD   1 year ago Diabetes mellitus type 2 in obese [facility])   [facility] Steele Sizer, MD   1 year ago Type 2 diabetes mellitus with microalbuminuria, without long-term current use of insulin [facility])   [facility] Steele Sizer, MD      Future Appointments            In 2 weeks Steele Sizer, MD [facility], [facility]

## 2020-11-25 NOTE — Progress Notes (Signed)
Name: Isaac Cross   MRN: [id number]    DOB: 1960/06/14   Date:11/27/2020       Progress Note  Subjective  Chief Complaint  Follow Up  HPI  S/P bilateral total knee Replacements:  It was done by  Dr. Harlow Mares on May 15th, 2020 in [place]. He is doing well, walking without a cane, no longer having pain, he has been going for walks about 1 mile 3 times a week. He uses Voltaren gel prn and needs a refill.    COPD/Asthma:usually moderate but currently under control with medication,, usually only uses it every other day,  he  denies cough, SOB or wheezing at this time .    OSA: he wears it every night CPAP machine, he denies waking up with headaches, he has been feeling rested when he gets up. He states snoring is better when wearing it    DMII:   A1C today it 6.4 %. He is eating more at home, retired in March 2022   He is taking Metformin 1000 mg BID. He denies polyphagia, polydipsia or polyuria . Urine micro is up to date and negative . He has associated dyslipidemia, obesity , HTN . He has a history of neuropathy but doing well at this time, no pain or tingling    HTN: he is taking lisinopril 20 mg and bp has been towards high end of normal today but was at goal during his visit with Dr. Honor Junes last visit ,  no dizziness, chest pain or palpitation .    Atherosclerosis abdominal aorta: taking statin therapy, last LDL was 73, he is also on aspirin 81 mg    Adrenal insufficiency: size of adrenal glands improved still taking medication given by Dr. Honor Junes , taking medications as prescribed, denies fatigue , rashes. He is doing well    Depression/anxiety: doing well on medication. Phq 9 is negative. Retired, volunteering 3 times a week    Patient Active Problem List   Diagnosis Date Noted  . History of total knee arthroplasty 09/04/2018  . Coronary atherosclerosis due to calcified coronary lesion 12/02/2017  . Atherosclerosis of abdominal aorta ([place]) 06/17/2015  . Fatty liver disease,  nonalcoholic 37/62/8315  . Renal cyst, right 06/17/2015  . BPH (benign prostatic hyperplasia) 05/25/2015  . Microscopic hematuria 05/25/2015  . Second degree hemorrhoids   . Diverticulosis of large intestine without diverticulitis   . Asthma, mild intermittent, well-controlled 10/16/2014  . Allergic rhinitis 10/13/2014  . ED (erectile dysfunction) of organic origin 07/22/2014  . Gastro-esophageal reflux disease without esophagitis 07/22/2014  . Benign hypertension 07/22/2014  . 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 Adult BMI 30+ 07/22/2014  . Chronic obstructive asthma ([place]) 07/22/2014  . Depression with anxiety 07/22/2014  . Dyslipidemia 07/22/2014  . Genital herpes 07/22/2014  . 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 Osteoarthritis of both knees 07/22/2014  . Obstructive apnea 05/14/2013  . 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 Blood in the urine 09/20/2006    Past Surgical History:  Procedure Laterality Date  . BLADDER REPAIR    . COLONOSCOPY WITH PROPOFOL N/A 04/07/2015   Procedure: COLONOSCOPY WITH PROPOFOL;  Surgeon: Lucilla Lame, MD;  Location: [place];  Service: Endoscopy;  Laterality: N/A;  Diabetic - oral meds CPAP  . HAND SURGERY     4TH AND 5TH FINGER  . I & D KNEE WITH POLY EXCHANGE Right 09/18/2018   Procedure: IRRIGATION AND DEBRIDEMENT KNEE WITH POLY EXCHANGE;  Surgeon:  Lovell Sheehan, MD;  Location: [facility];  Service: Orthopedics;  Laterality: Right;  . KNEE ARTHROSCOPY Right   . 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 TOTAL KNEE ARTHROPLASTY Left 09/17/2018   Procedure: I &D left knee and polyethylene exchange;  Surgeon: Lovell Sheehan, MD;  Location: [facility];  Service: Orthopedics;  Laterality: Left;  Marland Kitchen VEIN SURGERY Right     Family History  Problem Relation Age of Onset  . Hypertension Mother   . Diabetes Mother   . Diabetes Father   . Hypertension Father   . Cancer Father   . Seizures  Daughter   . Lupus Daughter   . Cancer Maternal Grandfather        Prostate  . Diabetes Paternal Grandfather   . Prostate cancer Maternal Uncle   . Bladder Cancer Neg Hx   . Kidney cancer Neg Hx     Social History   Tobacco Use  . Smoking status: Former    Packs/day: 1.00    Years: 10.00    Pack years: 10.00    Types: Cigarettes    Start date: 04/20/1995    Quit date: 04/19/2005    Years since quitting: 15.6  . Smokeless tobacco: Never  Substance Use Topics  . Alcohol use: Yes    Alcohol/week: 10.0 standard drinks    Types: 10 Cans of beer per week    Comment: occasional     Current Outpatient Medications:  .  acetaminophen (TYLENOL) 500 MG tablet, Take 1 tablet (500 mg total) by mouth every 6 (six) hours as needed., Disp: 90 tablet, Rfl: 0 .  acidophilus (RISAQUAD) CAPS capsule, Take 1 capsule by mouth daily., Disp: 30 capsule, Rfl: 0 .  albuterol (VENTOLIN HFA) 108 (90 Base) MCG/ACT inhaler, INHALE 2 PUFFS INTO THE LUNGS 4 (FOUR) TIMES DAILY., Disp: 18 g, Rfl: 2 .  cetirizine (ZYRTEC) 10 MG tablet, Take 10 mg by mouth daily., Disp: , Rfl:  .  diclofenac Sodium (VOLTAREN) 1 % GEL, Apply 4 g topically 4 (four) times daily., Disp: 300 g, Rfl: 1 .  fluticasone furoate-vilanterol (BREO ELLIPTA) 100-25 MCG/INH AEPB, Inhale 1 puff into the lungs daily., Disp: 180 each, Rfl: 1 .  lisinopril (ZESTRIL) 20 MG tablet, Take 1 tablet (20 mg total) by mouth daily., Disp: 90 tablet, Rfl: 1 .  metFORMIN (GLUCOPHAGE) 1000 MG tablet, Take 1 tablet (1,000 mg total) by mouth 2 (two) times daily with a meal., Disp: 180 tablet, Rfl: 1 .  Multiple Vitamins-Minerals (MENS MULTIVITAMIN PLUS) TABS, Take by mouth., Disp: , Rfl:  .  Omega-3 Fatty Acids (FISH OIL) 1000 MG CAPS, Take by mouth daily., Disp: , Rfl:  .  rosuvastatin (CRESTOR) 10 MG tablet, Take 1 tablet (10 mg total) by mouth daily., Disp: 90 tablet, Rfl: 1 .  SENNA-PLUS 8.6-50 MG tablet, Take 2 tablets by mouth every morning., Disp: , Rfl:   .  acetic acid-hydrocortisone (VOSOL-HC) OTIC solution, Place 3 drops into both ears 2 (two) times daily., Disp: 10 mL, Rfl: 0 .  citalopram (CELEXA) 20 MG tablet, Take 1 tablet (20 mg total) by mouth daily., Disp: 90 tablet, Rfl: 1 .  fludrocortisone (FLORINEF) 0.1 MG tablet, Take 1 tablet (0.1 mg total) by mouth daily., Disp: 60 tablet, Rfl: 0 .  hydrocortisone (CORTEF) 20 MG tablet, Take 0.5-1 tablets (10-20 mg total) by mouth 2 (two) times daily. 1 in am and half at night, Disp: 60 tablet, Rfl: 0  Allergies  Allergen Reactions  .  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 Lipitor [Atorvastatin] Hives and Itching  . Other   . Viagra  [Sildenafil Citrate]     vision loss    I personally reviewed active problem list, medication list, allergies, family history, social history, health maintenance, notes from last encounter with the patient/caregiver today.   ROS  Constitutional: Negative for fever or weight change.  Respiratory: Negative for cough and shortness of breath.   Cardiovascular: Negative for chest pain or palpitations.  Gastrointestinal: Negative for abdominal pain, no bowel changes.  Musculoskeletal: Negative for gait problem or joint swelling.  Skin: Negative for rash.  Neurological: Negative for dizziness or headache.  No other specific complaints in a complete review of systems (except as listed in HPI above).   Objective  Vitals:   11/27/20 0732  BP: 136/88  Pulse: 89  Resp: 16  Temp: 98.8 F (37.1 C)  SpO2: 99%  Weight: (!) 328 lb (148.8 kg)  Height: 6\' 4"  (1.93 m)    Body mass index is 39.93 kg/m.  Physical Exam  Constitutional: Patient appears well-developed and well-nourished. Obese  No distress.  HEENT: head atraumatic, normocephalic, pupils equal and reactive to light, neck supple Cardiovascular: Normal rate, regular rhythm and normal heart sounds.  No murmur heard. No BLE edema. Pulmonary/Chest: Effort normal and  breath sounds normal. No respiratory distress. Abdominal: Soft.  There is no tenderness. Psychiatric: Patient has a normal mood and affect. behavior is normal. Judgment and thought content normal.   Diabetic Foot Exam: Diabetic Foot Exam - Simple   Simple Foot Form Diabetic Foot exam was performed with the following findings: Yes 11/27/2020  8:06 AM  Visual Inspection See comments: Yes Sensation Testing Intact to touch and monofilament testing bilaterally: Yes Pulse Check Posterior Tibialis and Dorsalis pulse intact bilaterally: Yes Comments Thick and brittle toe nails       PHQ2/9: Depression screen [facility] 2/9 11/27/2020 07/25/2020 03/27/2020 12/26/2019 09/19/2019  Decreased Interest 0 0 0 0 0  Down, Depressed, Hopeless 0 0 0 0 0  PHQ - 2 Score 0 0 0 0 0  Altered sleeping 0 0 0 0 0  Tired, decreased energy 0 0 0 0 0  Change in appetite 0 0 0 0 0  Feeling bad or failure about yourself  0 0 0 0 0  Trouble concentrating 0 0 0 0 0  Moving slowly or fidgety/restless 0 0 0 0 0  Suicidal thoughts 0 0 0 0 0  PHQ-9 Score 0 0 0 0 0  Difficult doing work/chores - - - - -  Some recent data might be hidden    phq 9 is negative   Fall Risk: Fall Risk  11/27/2020 07/25/2020 03/27/2020 12/26/2019 09/19/2019  Falls in the past year? 0 0 0 1 0  Number falls in past yr: 0 0 0 0 -  Injury with Fall? 0 0 0 0 -  Risk for fall due to : No Fall Risks - - - -  Follow up Falls prevention discussed - - - -      Functional Status Survey: Is the patient deaf or have difficulty hearing?: No Does the patient have difficulty seeing, even when wearing glasses/contacts?: No Does the patient have difficulty concentrating, remembering, or making decisions?: No Does the patient have difficulty walking or climbing stairs?: No Does the patient have difficulty dressing or bathing?: No Does the patient have difficulty doing errands alone such as visiting a  doctor's office or shopping?: No    Assessment & Plan  1.  Diabetes mellitus type 2 in obese (HCC)  - POCT HgB A1C - HM Diabetes Foot Exam  2. Adrenal insufficiency (HCC)  - fludrocortisone (FLORINEF) 0.1 MG tablet; Take 1 tablet (0.1 mg total) by mouth daily.  Dispense: 60 tablet; Refill: 0 - hydrocortisone (CORTEF) 20 MG tablet; Take 0.5-1 tablets (10-20 mg total) by mouth 2 (two) times daily. 1 in am and half at night  Dispense: 60 tablet; Refill: 0  3. COPD with asthma ([place])   4. Atherosclerosis of aorta ([place])   5. Benign hypertension   6. Mass of both adrenal glands ([place])   7. Depression with anxiety  - citalopram (CELEXA) 20 MG tablet; Take 1 tablet (20 mg total) by mouth daily.  Dispense: 90 tablet; Refill: 1  8. History of total bilateral knee replacement   9. Dyslipidemia   10. Obstructive apnea   11. Controlled type 2 diabetes mellitus with neuropathy ([place])   12. Need for shingles vaccine  - Varicella-zoster vaccine IM  13. Chronic otitis externa of both ears, unspecified type  - acetic acid-hydrocortisone (VOSOL-HC) OTIC solution; Place 3 drops into both ears 2 (two) times daily.  Dispense: 10 mL; Refill: 0

## 2020-11-27 ENCOUNTER — Encounter: Payer: Self-pay | Admitting: Family Medicine

## 2020-11-27 ENCOUNTER — Other Ambulatory Visit: Payer: Self-pay

## 2020-11-27 ENCOUNTER — Ambulatory Visit: Payer: BC Managed Care – PPO | Admitting: Family Medicine

## 2020-11-27 VITALS — BP 136/88 | HR 89 | Temp 98.8°F | Resp 16 | Ht 76.0 in | Wt 328.0 lb

## 2020-11-27 DIAGNOSIS — Z23 Encounter for immunization: Secondary | ICD-10-CM | POA: Diagnosis not present

## 2020-11-27 DIAGNOSIS — E1169 Type 2 diabetes mellitus with other specified complication: Secondary | ICD-10-CM | POA: Diagnosis not present

## 2020-11-27 DIAGNOSIS — E2740 Unspecified adrenocortical insufficiency: Secondary | ICD-10-CM

## 2020-11-27 DIAGNOSIS — G4733 Obstructive sleep apnea (adult) (pediatric): Secondary | ICD-10-CM

## 2020-11-27 DIAGNOSIS — J449 Chronic obstructive pulmonary disease, unspecified: Secondary | ICD-10-CM | POA: Diagnosis not present

## 2020-11-27 DIAGNOSIS — H6063 Unspecified chronic otitis externa, bilateral: Secondary | ICD-10-CM

## 2020-11-27 DIAGNOSIS — I700 Atherosclerosis of aorta: Secondary | ICD-10-CM

## 2020-11-27 DIAGNOSIS — F418 Other specified anxiety disorders: Secondary | ICD-10-CM

## 2020-11-27 DIAGNOSIS — I10 Essential (primary) hypertension: Secondary | ICD-10-CM

## 2020-11-27 DIAGNOSIS — E278 Other specified disorders of adrenal gland: Secondary | ICD-10-CM

## 2020-11-27 DIAGNOSIS — E669 Obesity, unspecified: Secondary | ICD-10-CM | POA: Diagnosis not present

## 2020-11-27 DIAGNOSIS — E1140 Type 2 diabetes mellitus with diabetic neuropathy, unspecified: Secondary | ICD-10-CM

## 2020-11-27 DIAGNOSIS — Z96653 Presence of artificial knee joint, bilateral: Secondary | ICD-10-CM

## 2020-11-27 DIAGNOSIS — E785 Hyperlipidemia, unspecified: Secondary | ICD-10-CM

## 2020-11-27 LAB — POCT GLYCOSYLATED HEMOGLOBIN (HGB A1C): Hemoglobin A1C: 6.4 % — AB (ref 4.0–5.6)

## 2020-11-27 MED ORDER — HYDROCORTISONE 20 MG PO TABS: 10.0000 mg | ORAL_TABLET | Freq: Two times a day (BID) | ORAL | 0 refills | Status: DC

## 2020-11-27 MED ORDER — FLUDROCORTISONE ACETATE 0.1 MG PO TABS: 0.1000 mg | ORAL_TABLET | Freq: Every day | ORAL | 0 refills | Status: DC

## 2020-11-27 MED ORDER — HYDROCORTISONE-ACETIC ACID 1-2 % OT SOLN: 3.0000 [drp] | Freq: Two times a day (BID) | OTIC | 0 refills | Status: DC

## 2020-11-27 MED ORDER — CITALOPRAM HYDROBROMIDE 20 MG PO TABS: 20.0000 mg | ORAL_TABLET | Freq: Every day | ORAL | 1 refills | Status: DC

## 2021-02-07 ENCOUNTER — Other Ambulatory Visit: Payer: Self-pay | Admitting: Family Medicine

## 2021-02-07 DIAGNOSIS — E1169 Type 2 diabetes mellitus with other specified complication: Secondary | ICD-10-CM

## 2021-02-07 DIAGNOSIS — E669 Obesity, unspecified: Secondary | ICD-10-CM

## 2021-02-07 DIAGNOSIS — E1140 Type 2 diabetes mellitus with diabetic neuropathy, unspecified: Secondary | ICD-10-CM

## 2021-02-07 DIAGNOSIS — E1129 Type 2 diabetes mellitus with other diabetic kidney complication: Secondary | ICD-10-CM

## 2021-02-07 NOTE — Telephone Encounter (Signed)
Requested Prescriptions  Pending Prescriptions Disp Refills  . metFORMIN (GLUCOPHAGE) 1000 MG tablet [Pharmacy Med Name: METFORMIN HCL 1,000 MG TABLET] 180 tablet 0    Sig: TAKE 1 TABLET (1,000 MG TOTAL) BY MOUTH 2 (TWO) TIMES DAILY WITH A MEAL.     Endocrinology:  Diabetes - Biguanides Passed - 02/07/2021  9:14 AM      Passed - Cr in normal range and within 360 days    Creat  Date Value Ref Range Status  03/27/2020 1.15 0.70 - 1.25 mg/dL Final    Comment:    For patients >65 years of age, the reference limit for Creatinine is approximately 13% higher for people identified as African-American. .    Creatinine, POC  Date Value Ref Range Status  04/08/2017 neg mg/dL Corrected   Creatinine, Urine  Date Value Ref Range Status  12/06/2018 227 20 - 320 mg/dL Final         Passed - HBA1C is between 0 and 7.9 and within 180 days    Hemoglobin A1C  Date Value Ref Range Status  11/27/2020 6.4 (A) 4.0 - 5.6 % Final   HbA1c, POC (controlled diabetic range)  Date Value Ref Range Status  03/06/2018 6.4 0.0 - 7.0 % Final   Hgb A1c MFr Bld  Date Value Ref Range Status  03/14/2019 6.7 (H) <5.7 % of total Hgb Final    Comment:    For someone without known diabetes, a hemoglobin A1c value of 6.5% or greater indicates that they may have  diabetes and this should be confirmed with a follow-up  test. . For someone with known diabetes, a value <7% indicates  that their diabetes is well controlled and a value  greater than or equal to 7% indicates suboptimal  control. A1c targets should be individualized based on  duration of diabetes, age, comorbid conditions, and  other considerations. . Currently, no consensus exists regarding use of hemoglobin A1c for diagnosis of diabetes for children. .          Passed - AA eGFR in normal range and within 360 days    GFR, Est African American  Date Value Ref Range Status  03/27/2020 79 > OR = 60 mL/min/1.67m Final   GFR, Est Non African  American  Date Value Ref Range Status  03/27/2020 68 > OR = 60 mL/min/1.769mFinal         Passed - Valid encounter within last 6 months    Recent Outpatient Visits          2 months [facility]   6 months [facility]   10 months [facility]   1 year [facility]   1 year ago [facility]      Future Appointments            In 1 month [facility]

## 2021-04-01 NOTE — Progress Notes (Signed)
Name: Isaac Cross   MRN: [id number]    DOB: Jan 21, 1961   Date:04/02/2021       Progress Note  Subjective  Chief Complaint  Follow up   HPI  Patient came in for his regular follow up visit but was found to be tachycardic and febrile. He states noticed some nasal congestion a few days ago but denies any other symptoms. He has adrenal insufficiency and is aware of high risk of complications. He will increase dose of hydrocortisone as recommended by endocrinologist.  Advised him to monitor for other symptoms and to contact us back since only  mildly symptomatic at this time and it could be the beginning of a serious medical problem.   DM/Dyslipidemia: monitor glucose , check labs prior to his next visit. Re-schedule regular follow up and call back sooner if any other symptoms develops   He had all COVID-19 vaccines but not the flu vaccine   Patient Active Problem List   Diagnosis Date Noted   History of total knee arthroplasty 09/04/2018   Coronary atherosclerosis due to calcified coronary lesion 12/02/2017   Atherosclerosis of abdominal aorta ([place]) 06/17/2015   Fatty liver disease, nonalcoholic 29/52/8413   Renal cyst, right 06/17/2015   BPH (benign prostatic hyperplasia) 05/25/2015   Microscopic hematuria 05/25/2015   Second degree hemorrhoids    Diverticulosis of large intestine without diverticulitis    Asthma, mild intermittent, well-controlled 10/16/2014   Allergic rhinitis 10/13/2014   ED (erectile dysfunction) of organic origin 07/22/2014   Gastro-esophageal reflux disease without esophagitis 07/22/2014   Benign hypertension 07/22/2014   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[place]) 07/22/2014   Adult BMI 30+ 07/22/2014   Chronic obstructive asthma ([place]) 07/22/2014   Depression with anxiety 07/22/2014   Dyslipidemia 07/22/2014   Genital herpes 07/22/2014   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   Osteoarthritis of both knees 07/22/2014   Obstructive  apnea 05/14/2013   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   Blood in the urine 09/20/2006    Past Surgical History:  Procedure Laterality Date   BLADDER REPAIR     COLONOSCOPY WITH PROPOFOL N/A 04/07/2015   Procedure: COLONOSCOPY WITH PROPOFOL;  Surgeon: Lucilla Lame, MD;  Location: [place];  Service: Endoscopy;  Laterality: N/A;  Diabetic - oral meds CPAP   HAND SURGERY     4TH AND 5TH FINGER   I & D KNEE WITH POLY EXCHANGE Right 09/18/2018   Procedure: IRRIGATION AND DEBRIDEMENT KNEE WITH POLY EXCHANGE;  Surgeon: Lovell Sheehan, MD;  Location: [facility];  Service: Orthopedics;  Laterality: Right;   KNEE ARTHROSCOPY Right    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   TOTAL KNEE ARTHROPLASTY Left 09/17/2018   Procedure: I &D left knee and polyethylene exchange;  Surgeon: Lovell Sheehan, MD;  Location: [facility];  Service: Orthopedics;  Laterality: Left;   VEIN SURGERY Right     Family History  Problem Relation Age of Onset   Hypertension Mother    Diabetes Mother    Diabetes Father    Hypertension Father    Cancer Father    Seizures Daughter    Lupus Daughter    Cancer Maternal Grandfather        Prostate   Diabetes Paternal Grandfather    Prostate cancer Maternal Uncle    Bladder Cancer Neg Hx    Kidney cancer Neg Hx     Social History   Tobacco Use   Smoking status: Former    Packs/day: 1.00  Years: 10.00    Pack years: 10.00    Types: Cigarettes    Start date: 04/20/1995    Quit date: 04/19/2005    Years since quitting: 15.9   Smokeless tobacco: Never  Substance Use Topics   Alcohol use: Yes    Alcohol/week: 10.0 standard drinks    Types: 10 Cans of beer per week    Comment: occasional     Current Outpatient Medications:    acetaminophen (TYLENOL) 500 MG tablet, Take 1 tablet (500 mg total) by mouth every 6 (six) hours as needed., Disp: 90 tablet, Rfl: 0   acetic acid-hydrocortisone (VOSOL-HC) OTIC solution, Place 3 drops into both ears 2 (two)  times daily., Disp: 10 mL, Rfl: 0   acidophilus (RISAQUAD) CAPS capsule, Take 1 capsule by mouth daily., Disp: 30 capsule, Rfl: 0   albuterol (VENTOLIN HFA) 108 (90 Base) MCG/ACT inhaler, INHALE 2 PUFFS INTO THE LUNGS 4 (FOUR) TIMES DAILY., Disp: 18 g, Rfl: 2   cetirizine (ZYRTEC) 10 MG tablet, Take 10 mg by mouth daily., Disp: , Rfl:    citalopram (CELEXA) 20 MG tablet, Take 1 tablet (20 mg total) by mouth daily., Disp: 90 tablet, Rfl: 1   diclofenac Sodium (VOLTAREN) 1 % GEL, Apply 4 g topically 4 (four) times daily., Disp: 300 g, Rfl: 1   fludrocortisone (FLORINEF) 0.1 MG tablet, Take 1 tablet (0.1 mg total) by mouth daily., Disp: 60 tablet, Rfl: 0   fluticasone furoate-vilanterol (BREO ELLIPTA) 100-25 MCG/INH AEPB, Inhale 1 puff into the lungs daily., Disp: 180 each, Rfl: 1   hydrocortisone (CORTEF) 20 MG tablet, Take 0.5-1 tablets (10-20 mg total) by mouth 2 (two) times daily. 1 in am and half at night, Disp: 60 tablet, Rfl: 0   lisinopril (ZESTRIL) 20 MG tablet, Take 1 tablet (20 mg total) by mouth daily., Disp: 90 tablet, Rfl: 1   metFORMIN (GLUCOPHAGE) 1000 MG tablet, TAKE 1 TABLET (1,000 MG TOTAL) BY MOUTH 2 (TWO) TIMES DAILY WITH A MEAL., Disp: 180 tablet, Rfl: 0   Multiple Vitamins-Minerals (MENS MULTIVITAMIN PLUS) TABS, Take by mouth., Disp: , Rfl:    Omega-3 Fatty Acids (FISH OIL) 1000 MG CAPS, Take by mouth daily., Disp: , Rfl:    rosuvastatin (CRESTOR) 10 MG tablet, Take 1 tablet (10 mg total) by mouth daily., Disp: 90 tablet, Rfl: 1   SENNA-PLUS 8.6-50 MG tablet, Take 2 tablets by mouth every morning., Disp: , Rfl:   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   ROS  Ten systems reviewed and is negative except as  mentioned in HPI   Objective  Vitals:   04/02/21 0754  BP: 128/84  Pulse: (!) 105  Resp: 18  Temp: 100.1 F (37.8 C)  TempSrc: Oral  SpO2: 98%  Weight: (!) 334 lb 4.8 oz (151.6 kg)  Height: 6\' 4"  (1.93 m)    Body mass index is 40.69 kg/m.  Physical Exam  Constitutional: Patient appears well-developed and well-nourished. Obese  No distress.  HEENT: head atraumatic, normocephalic, pupils equal and reactive to light, ears normal TM, neck supple Cardiovascular: tachycardia, regular rhythm and normal heart sounds.  No murmur heard. No BLE edema. Pulmonary/Chest: Effort normal and breath sounds normal. No  respiratory distress. Abdominal: Soft.  There is no tenderness. Psychiatric: Patient has a normal mood and affect. behavior is normal. Judgment and thought content normal.   Recent Results (from the past 2160 hour(s))  POCT Influenza A/B     Status: Normal   Collection Time: 04/02/21  8:40 AM  Result Value Ref Range   Influenza A, POC Negative Negative   Influenza B, POC Negative Negative     PHQ2/9: Depression screen [facility] 2/9 04/02/2021 11/27/2020 07/25/2020 03/27/2020 12/26/2019  Decreased Interest 0 0 0 0 0  Down, Depressed, Hopeless 0 0 0 0 0  PHQ - 2 Score 0 0 0 0 0  Altered sleeping - 0 0 0 0  Tired, decreased energy - 0 0 0 0  Change in appetite - 0 0 0 0  Feeling bad or failure about yourself  - 0 0 0 0  Trouble concentrating - 0 0 0 0  Moving slowly or fidgety/restless - 0 0 0 0  Suicidal thoughts - 0 0 0 0  PHQ-9 Score - 0 0 0 0  Some recent data might be hidden    phq 9 is negative   Fall Risk: Fall Risk  04/02/2021 11/27/2020 07/25/2020 03/27/2020 12/26/2019  Falls in the past year? 0 0 0 0 1  Number falls in past yr: 0 0 0 0 0  Injury with Fall? 0 0 0 0 0  Risk for fall due to : - No Fall Risks - - -  Follow up Falls evaluation completed Falls prevention discussed - - -      Functional Status Survey: Is the patient deaf or have difficulty hearing?:  No Does the patient have difficulty seeing, even when wearing glasses/contacts?: No Does the patient have difficulty concentrating, remembering, or making decisions?: No Does the patient have difficulty walking or climbing stairs?: No Does the patient have difficulty dressing or bathing?: No Does the patient have difficulty doing errands alone such as visiting a doctor's office or shopping?: No    Assessment & Plan  1. URI, acute  Negative rapid flu, COVID test collected and pending results   2. Adrenal insufficiency (HCC)  Double dose of hydrocortisone as directed by Endo   3. Dyslipidemia  - Lipid panel  4. Type 2 diabetes mellitus with other diabetic kidney complication (HCC)  - COMPLETE METABOLIC PANEL WITH GFR - Hemoglobin A1c  5. Long-term use of high-risk medication  - CBC with Differential/Platelet - Vitamin B12  6. Fever and chills  - POCT Influenza A/B - Novel Coronavirus, NAA ([facility])

## 2021-04-02 ENCOUNTER — Encounter: Payer: Self-pay | Admitting: Family Medicine

## 2021-04-02 ENCOUNTER — Ambulatory Visit: Payer: BC Managed Care – PPO | Admitting: Family Medicine

## 2021-04-02 ENCOUNTER — Other Ambulatory Visit: Payer: Self-pay

## 2021-04-02 ENCOUNTER — Other Ambulatory Visit: Payer: Self-pay | Admitting: Family Medicine

## 2021-04-02 VITALS — BP 128/84 | HR 105 | Temp 100.1°F | Resp 18 | Ht 76.0 in | Wt 334.3 lb

## 2021-04-02 DIAGNOSIS — J069 Acute upper respiratory infection, unspecified: Secondary | ICD-10-CM | POA: Diagnosis not present

## 2021-04-02 DIAGNOSIS — R509 Fever, unspecified: Secondary | ICD-10-CM

## 2021-04-02 DIAGNOSIS — Z79899 Other long term (current) drug therapy: Secondary | ICD-10-CM

## 2021-04-02 DIAGNOSIS — E1129 Type 2 diabetes mellitus with other diabetic kidney complication: Secondary | ICD-10-CM | POA: Diagnosis not present

## 2021-04-02 DIAGNOSIS — E1169 Type 2 diabetes mellitus with other specified complication: Secondary | ICD-10-CM

## 2021-04-02 DIAGNOSIS — E785 Hyperlipidemia, unspecified: Secondary | ICD-10-CM | POA: Diagnosis not present

## 2021-04-02 DIAGNOSIS — Z23 Encounter for immunization: Secondary | ICD-10-CM

## 2021-04-02 DIAGNOSIS — E2740 Unspecified adrenocortical insufficiency: Secondary | ICD-10-CM

## 2021-04-02 LAB — POCT INFLUENZA A/B
Influenza A, POC: NEGATIVE
Influenza B, POC: NEGATIVE

## 2021-04-03 ENCOUNTER — Other Ambulatory Visit: Payer: Self-pay | Admitting: Family Medicine

## 2021-04-03 DIAGNOSIS — E785 Hyperlipidemia, unspecified: Secondary | ICD-10-CM

## 2021-04-03 LAB — SARS-COV-2, NAA 2 DAY TAT

## 2021-04-03 LAB — NOVEL CORONAVIRUS, NAA: SARS-CoV-2, NAA: NOT DETECTED

## 2021-04-15 NOTE — Progress Notes (Signed)
Name: Isaac Cross   MRN: [id number]    DOB: 11-26-1960   Date:04/16/2021       Progress Note  Subjective  Chief Complaint  Follow Up  HPI  S/P bilateral total knee Replacements:  It was done by  Dr. Harlow Mares on May 15th, 2020 in [place]. He has been going for walks, denies any pain    COPD/Asthma:usually moderate but currently under control with medication,, usually only uses it every other day,  he  denies cough, SOB or wheezing at this time .    AR: he has been taking Zyrtec for a while, states not longer working, had daily congestion and rhinorrhea   OSA: he wears it every night CPAP machine, he denies waking up with headaches, he has been feeling rested when he gets up. Snoring has improved only present when masked not sealed    DMII:   A1C was 6.4 % last visit  He is eating more at home, retired in March 2022   He is taking Metformin 1000 mg BID. He denies polyphagia, polydipsia or polyuria . Urine micro is up to date and negative . He has associated dyslipidemia, obesity , HTN . He has a history of neuropathy but no longer having pain or tingling    HTN: he is taking lisinopril 20 mg and bp is at goal   no dizziness, chest pain or palpitation .    Atherosclerosis abdominal aorta: taking statin therapy, last LDL was 73, he is also on aspirin 81 mg We will recheck labs today    Adrenal insufficiency/Adrenal mass bilaterally : size of adrenal glands improved still taking medication given by Dr. Honor Junes , taking medications as prescribed, denies fatigue , rashes.   Depression/anxiety: doing well on medication. Phq 9 is negative. Retired, volunteering 3 times a week . He has good motivation, energy but does not want to stop medication   Patient Active Problem List   Diagnosis Date Noted   History of total knee arthroplasty 09/04/2018   Coronary atherosclerosis due to calcified coronary lesion 12/02/2017   Atherosclerosis of abdominal aorta ([place]) 06/17/2015   Fatty liver disease,  nonalcoholic 94/76/5465   Renal cyst, right 06/17/2015   BPH (benign prostatic hyperplasia) 05/25/2015   Microscopic hematuria 05/25/2015   Second degree hemorrhoids    Diverticulosis of large intestine without diverticulitis    Asthma, mild intermittent, well-controlled 10/16/2014   Allergic rhinitis 10/13/2014   ED (erectile dysfunction) of organic origin 07/22/2014   Gastro-esophageal reflux disease without esophagitis 07/22/2014   Benign hypertension 07/22/2014   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[place]) 07/22/2014   Adult BMI 30+ 07/22/2014   Chronic obstructive asthma ([place]) 07/22/2014   Depression with anxiety 07/22/2014   Dyslipidemia 07/22/2014   Genital herpes 07/22/2014   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   Osteoarthritis of both knees 07/22/2014   Obstructive apnea 05/14/2013   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   Blood in the urine 09/20/2006    Past Surgical History:  Procedure Laterality Date   BLADDER REPAIR     COLONOSCOPY WITH PROPOFOL N/A 04/07/2015   Procedure: COLONOSCOPY WITH PROPOFOL;  Surgeon: Lucilla Lame, MD;  Location: [facility];  Service: Endoscopy;  Laterality: N/A;  Diabetic - oral meds CPAP   HAND SURGERY     4TH AND 5TH FINGER   I & D KNEE WITH POLY EXCHANGE Right 09/18/2018   Procedure: IRRIGATION AND DEBRIDEMENT KNEE WITH POLY EXCHANGE;  Surgeon: Lovell Sheehan, MD;  Location: [facility]  ORS;  Service: Orthopedics;  Laterality: Right;   KNEE ARTHROSCOPY Right    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   TOTAL KNEE ARTHROPLASTY Left 09/17/2018   Procedure: I &D left knee and polyethylene exchange;  Surgeon: Lovell Sheehan, MD;  Location: [facility] ORS;  Service: Orthopedics;  Laterality: Left;   VEIN SURGERY Right     Family History  Problem Relation Age of Onset   Hypertension Mother    Diabetes Mother    Diabetes Father    Hypertension Father    Cancer Father    Seizures Daughter    Lupus Daughter     Cancer Maternal Grandfather        Prostate   Diabetes Paternal Grandfather    Prostate cancer Maternal Uncle    Bladder Cancer Neg Hx    Kidney cancer Neg Hx     Social History   Tobacco Use   Smoking status: Former    Packs/day: 1.00    Years: 10.00    Pack years: 10.00    Types: Cigarettes    Start date: 04/20/1995    Quit date: 04/19/2005    Years since quitting: 16.0   Smokeless tobacco: Never  Substance Use Topics   Alcohol use: Yes    Alcohol/week: 10.0 standard drinks    Types: 10 Cans of beer per week    Comment: occasional     Current Outpatient Medications:    acetaminophen (TYLENOL) 500 MG tablet, Take 1 tablet (500 mg total) by mouth every 6 (six) hours as needed., Disp: 90 tablet, Rfl: 0   acetic acid-hydrocortisone (VOSOL-HC) OTIC solution, Place 3 drops into both ears 2 (two) times daily., Disp: 10 mL, Rfl: 0   acidophilus (RISAQUAD) CAPS capsule, Take 1 capsule by mouth daily., Disp: 30 capsule, Rfl: 0   albuterol (VENTOLIN HFA) 108 (90 Base) MCG/ACT inhaler, INHALE 2 PUFFS INTO THE LUNGS 4 (FOUR) TIMES DAILY., Disp: 18 g, Rfl: 2   cetirizine (ZYRTEC) 10 MG tablet, Take 10 mg by mouth daily., Disp: , Rfl:    citalopram (CELEXA) 20 MG tablet, Take 1 tablet (20 mg total) by mouth daily., Disp: 90 tablet, Rfl: 1   diclofenac Sodium (VOLTAREN) 1 % GEL, Apply 4 g topically 4 (four) times daily., Disp: 300 g, Rfl: 1   fludrocortisone (FLORINEF) 0.1 MG tablet, Take 1 tablet (0.1 mg total) by mouth daily., Disp: 60 tablet, Rfl: 0   fluticasone furoate-vilanterol (BREO ELLIPTA) 100-25 MCG/INH AEPB, Inhale 1 puff into the lungs daily., Disp: 180 each, Rfl: 1   hydrocortisone (CORTEF) 20 MG tablet, Take 0.5-1 tablets (10-20 mg total) by mouth 2 (two) times daily. 1 in am and half at night, Disp: 60 tablet, Rfl: 0   lisinopril (ZESTRIL) 20 MG tablet, Take 1 tablet (20 mg total) by mouth daily., Disp: 90 tablet, Rfl: 1   metFORMIN (GLUCOPHAGE) 1000 MG tablet, TAKE 1 TABLET  (1,000 MG TOTAL) BY MOUTH 2 (TWO) TIMES DAILY WITH A MEAL., Disp: 180 tablet, Rfl: 0   Multiple Vitamins-Minerals (MENS MULTIVITAMIN PLUS) TABS, Take by mouth., Disp: , Rfl:    Omega-3 Fatty Acids (FISH OIL) 1000 MG CAPS, Take by mouth daily., Disp: , Rfl:    rosuvastatin (CRESTOR) 10 MG tablet, TAKE 1 TABLET BY MOUTH EVERY DAY, Disp: 90 tablet, Rfl: 1   SENNA-PLUS 8.6-50 MG tablet, Take 2 tablets by mouth every morning., Disp: , Rfl:   Allergies  Allergen Reactions   Heparin     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   ROS  Constitutional: Negative for fever or weight change.  Respiratory: Negative for cough and shortness of breath.   Cardiovascular: Negative for chest pain or palpitations.  Gastrointestinal: Negative for abdominal pain, no bowel changes.  Musculoskeletal: Negative for gait problem or joint swelling.  Skin: Negative for rash.  Neurological: Negative for dizziness or headache.  No other specific complaints in a complete review of systems (except as listed in HPI above).   Objective  Vitals:   04/16/21 1048  BP: 138/76  Pulse: 90  Resp: 16  Temp: 98.4 F (36.9 C)  SpO2: 98%  Weight: (!) 333 lb (151 kg)  Height: 6\' 4"  (1.93 m)    Body mass index is 40.53 kg/m.  Physical Exam  Constitutional: Patient appears well-developed and well-nourished. Obese  No distress.  HEENT: head atraumatic, normocephalic, pupils equal and reactive to light, neck supple Cardiovascular: Normal rate, regular rhythm and normal heart sounds.  No murmur heard. Trace  BLE edema. Pulmonary/Chest: Effort normal and breath sounds normal. No respiratory distress. Abdominal: Soft.  There is no  tenderness. Psychiatric: Patient has a normal mood and affect. behavior is normal. Judgment and thought content normal.   Recent Results (from the past 2160 hour(s))  Novel Coronavirus, NAA ([facility])     Status: None   Collection Time: 04/02/21 12:00 AM  Result Value Ref Range   SARS-CoV-2, NAA Not Detected Not Detected    Comment: This nucleic acid amplification test was developed and its performance characteristics determined by [employer]. Nucleic acid amplification tests include RT-PCR and TMA. This test has not been FDA cleared or approved. This test has been authorized by FDA under an Emergency Use Authorization (EUA). This test is only authorized for the duration of time the declaration that circumstances exist justifying the authorization of the emergency use of in vitro diagnostic tests for detection of SARS-CoV-2 virus and/or diagnosis of COVID-19 infection under section 564(b)(1) of the Act, 21 U.S.C. 174YCX-4(G) (1), unless the authorization is terminated or revoked sooner. When diagnostic testing is negative, the possibility of a false negative result should be considered in the context of a patient's recent exposures and the presence of clinical signs and symptoms consistent with COVID-19. An individual without symptoms of COVID-19 and who is not shedding SARS-CoV-2 virus wo uld expect to have a negative (not detected) result in this assay.   SARS-COV-2, NAA 2 DAY TAT     Status: None   Collection Time: 04/02/21 12:00 AM  Result Value Ref Range   SARS-CoV-2, NAA 2 DAY TAT Performed   POCT Influenza A/B     Status: Normal   Collection Time: 04/02/21  8:40 AM  Result Value Ref Range   Influenza A, POC Negative Negative   Influenza B, POC Negative Negative    PHQ2/9: Depression screen [place] 2/9 04/16/2021 04/02/2021 11/27/2020 07/25/2020 03/27/2020  Decreased Interest 0 0 0 0 0  Down, Depressed, Hopeless 0 0 0 0 0  PHQ - 2 Score 0 0 0 0 0  Altered sleeping 0 -  0 0 0  Tired, decreased energy 0 - 0 0 0  Change in appetite 0 - 0 0 0  Feeling bad or failure about  yourself  0 - 0 0 0  Trouble concentrating 0 - 0 0 0  Moving slowly or fidgety/restless 0 - 0 0 0  Suicidal thoughts 0 - 0 0 0  PHQ-9 Score 0 - 0 0 0  Some recent data might be hidden    phq 9 is negative   Fall Risk: Fall Risk  04/16/2021 04/02/2021 11/27/2020 07/25/2020 03/27/2020  Falls in the past year? 0 0 0 0 0  Number falls in past yr: 0 0 0 0 0  Injury with Fall? 0 0 0 0 0  Risk for fall due to : No Fall Risks - No Fall Risks - -  Follow up Falls prevention discussed Falls evaluation completed Falls prevention discussed - -      Functional Status Survey: Is the patient deaf or have difficulty hearing?: No Does the patient have difficulty seeing, even when wearing glasses/contacts?: No Does the patient have difficulty concentrating, remembering, or making decisions?: No Does the patient have difficulty walking or climbing stairs?: No Does the patient have difficulty dressing or bathing?: No Does the patient have difficulty doing errands alone such as visiting a doctor's office or shopping?: No    Assessment & Plan  1. Adrenal insufficiency ([place])  Keep follow up with Endo   2. Type 2 diabetes mellitus with other diabetic kidney complication (HCC)  - metFORMIN (GLUCOPHAGE) 1000 MG tablet; Take 1 tablet (1,000 mg total) by mouth 2 (two) times daily with a meal.  Dispense: 180 tablet; Refill: 1  3. Diabetes mellitus type 2 in obese (HCC)  - metFORMIN (GLUCOPHAGE) 1000 MG tablet; Take 1 tablet (1,000 mg total) by mouth 2 (two) times daily with a meal.  Dispense: 180 tablet; Refill: 1  4. COPD with asthma ([place])   5. Mass of both adrenal glands ([place])   6. Atherosclerosis of aorta (HCC)  Continue statin therapy   7. Benign hypertension  - lisinopril (ZESTRIL) 20 MG tablet; Take 1 tablet (20 mg total) by mouth daily.  Dispense: 90 tablet; Refill: 1  8.  Dyslipidemia   9. Obstructive apnea   10. History of total bilateral knee replacement   11. Needs flu shot  - Flu Vaccine QUAD 6+ mos PF IM (Fluarix Quad PF)  12. Need for shingles vaccine  - Varicella-zoster vaccine IM  13. Controlled type 2 diabetes mellitus with neuropathy (HCC)  - metFORMIN (GLUCOPHAGE) 1000 MG tablet; Take 1 tablet (1,000 mg total) by mouth 2 (two) times daily with a meal.  Dispense: 180 tablet; Refill: 1  14. Perennial allergic rhinitis with seasonal variation  - levocetirizine (XYZAL ALLERGY 24HR) 5 MG tablet; Take 1 tablet (5 mg total) by mouth every evening.  Dispense: 90 tablet; Refill: 1 - Azelastine-Fluticasone 137-50 MCG/ACT SUSP; Place 1 spray into the nose every 12 (twelve) hours.  Dispense: 23 g; Refill: 2

## 2021-04-16 ENCOUNTER — Ambulatory Visit: Payer: BC Managed Care – PPO | Admitting: Family Medicine

## 2021-04-16 ENCOUNTER — Encounter: Payer: Self-pay | Admitting: Family Medicine

## 2021-04-16 VITALS — BP 138/76 | HR 90 | Temp 98.4°F | Resp 16 | Ht 76.0 in | Wt 333.0 lb

## 2021-04-16 DIAGNOSIS — E278 Other specified disorders of adrenal gland: Secondary | ICD-10-CM

## 2021-04-16 DIAGNOSIS — E1169 Type 2 diabetes mellitus with other specified complication: Secondary | ICD-10-CM | POA: Diagnosis not present

## 2021-04-16 DIAGNOSIS — E669 Obesity, unspecified: Secondary | ICD-10-CM

## 2021-04-16 DIAGNOSIS — J4489 Other specified chronic obstructive pulmonary disease: Secondary | ICD-10-CM

## 2021-04-16 DIAGNOSIS — E1129 Type 2 diabetes mellitus with other diabetic kidney complication: Secondary | ICD-10-CM

## 2021-04-16 DIAGNOSIS — E1140 Type 2 diabetes mellitus with diabetic neuropathy, unspecified: Secondary | ICD-10-CM

## 2021-04-16 DIAGNOSIS — E2740 Unspecified adrenocortical insufficiency: Secondary | ICD-10-CM | POA: Diagnosis not present

## 2021-04-16 DIAGNOSIS — I700 Atherosclerosis of aorta: Secondary | ICD-10-CM

## 2021-04-16 DIAGNOSIS — G4733 Obstructive sleep apnea (adult) (pediatric): Secondary | ICD-10-CM

## 2021-04-16 DIAGNOSIS — J449 Chronic obstructive pulmonary disease, unspecified: Secondary | ICD-10-CM

## 2021-04-16 DIAGNOSIS — J302 Other seasonal allergic rhinitis: Secondary | ICD-10-CM

## 2021-04-16 DIAGNOSIS — Z23 Encounter for immunization: Secondary | ICD-10-CM

## 2021-04-16 DIAGNOSIS — E785 Hyperlipidemia, unspecified: Secondary | ICD-10-CM

## 2021-04-16 DIAGNOSIS — I10 Essential (primary) hypertension: Secondary | ICD-10-CM

## 2021-04-16 DIAGNOSIS — Z96653 Presence of artificial knee joint, bilateral: Secondary | ICD-10-CM

## 2021-04-16 DIAGNOSIS — J3089 Other allergic rhinitis: Secondary | ICD-10-CM

## 2021-04-16 MED ORDER — LEVOCETIRIZINE DIHYDROCHLORIDE 5 MG PO TABS: 5.0000 mg | ORAL_TABLET | Freq: Every evening | ORAL | 1 refills | Status: DC

## 2021-04-16 MED ORDER — METFORMIN HCL 1000 MG PO TABS: 1000.0000 mg | ORAL_TABLET | Freq: Two times a day (BID) | ORAL | 1 refills | Status: DC

## 2021-04-16 MED ORDER — AZELASTINE-FLUTICASONE 137-50 MCG/ACT NA SUSP: 1.0000 | Freq: Two times a day (BID) | NASAL | 2 refills | Status: AC

## 2021-04-16 MED ORDER — LISINOPRIL 20 MG PO TABS: 20.0000 mg | ORAL_TABLET | Freq: Every day | ORAL | 1 refills | Status: DC

## 2021-04-21 ENCOUNTER — Other Ambulatory Visit: Payer: Self-pay

## 2021-04-21 DIAGNOSIS — Z79899 Other long term (current) drug therapy: Secondary | ICD-10-CM

## 2021-04-21 DIAGNOSIS — D649 Anemia, unspecified: Secondary | ICD-10-CM

## 2021-04-22 LAB — CBC WITH DIFFERENTIAL/PLATELET
Absolute Monocytes: 577 cells/uL (ref 200–950)
Basophils Absolute: 28 cells/uL (ref 0–200)
Basophils Relative: 0.5 %
Eosinophils Absolute: 28 cells/uL (ref 15–500)
Eosinophils Relative: 0.5 %
HCT: 39.3 % (ref 38.5–50.0)
Hemoglobin: 12.9 g/dL — ABNORMAL LOW (ref 13.2–17.1)
Lymphs Abs: 1585 cells/uL (ref 850–3900)
MCH: 27.7 pg (ref 27.0–33.0)
MCHC: 32.8 g/dL (ref 32.0–36.0)
MCV: 84.3 fL (ref 80.0–100.0)
MPV: 11 fL (ref 7.5–12.5)
Monocytes Relative: 10.3 %
Neutro Abs: 3382 cells/uL (ref 1500–7800)
Neutrophils Relative %: 60.4 %
Platelets: 203 10*3/uL (ref 140–400)
RBC: 4.66 10*6/uL (ref 4.20–5.80)
RDW: 13.5 % (ref 11.0–15.0)
Total Lymphocyte: 28.3 %
WBC: 5.6 10*3/uL (ref 3.8–10.8)

## 2021-04-22 LAB — IRON,TIBC AND FERRITIN PANEL
%SAT: 39 % (calc) (ref 20–48)
Ferritin: 209 ng/mL (ref 24–380)
Iron: 130 ug/dL (ref 50–180)
TIBC: 334 mcg/dL (calc) (ref 250–425)

## 2021-04-22 LAB — COMPLETE METABOLIC PANEL WITH GFR
AG Ratio: 1.7 (calc) (ref 1.0–2.5)
ALT: 29 U/L (ref 9–46)
AST: 29 U/L (ref 10–35)
Albumin: 4.2 g/dL (ref 3.6–5.1)
Alkaline phosphatase (APISO): 36 U/L (ref 35–144)
BUN: 13 mg/dL (ref 7–25)
CO2: 28 mmol/L (ref 20–32)
Calcium: 9.8 mg/dL (ref 8.6–10.3)
Chloride: 102 mmol/L (ref 98–110)
Creat: 1.24 mg/dL (ref 0.70–1.35)
Globulin: 2.5 g/dL (calc) (ref 1.9–3.7)
Glucose, Bld: 126 mg/dL — ABNORMAL HIGH (ref 65–99)
Potassium: 4.2 mmol/L (ref 3.5–5.3)
Sodium: 138 mmol/L (ref 135–146)
Total Bilirubin: 0.5 mg/dL (ref 0.2–1.2)
Total Protein: 6.7 g/dL (ref 6.1–8.1)
eGFR: 66 mL/min/{1.73_m2} (ref >=60)

## 2021-04-22 LAB — TEST AUTHORIZATION

## 2021-04-22 LAB — HEMOGLOBIN A1C
Hgb A1c MFr Bld: 6.9 % of total Hgb — ABNORMAL HIGH (ref <5.7)
Mean Plasma Glucose: 151 mg/dL
eAG (mmol/L): 8.4 mmol/L

## 2021-04-22 LAB — LIPID PANEL
Cholesterol: 157 mg/dL (ref <200)
HDL: 65 mg/dL (ref >=40)
LDL Cholesterol (Calc): 73 mg/dL (calc)
Non-HDL Cholesterol (Calc): 92 mg/dL (calc) (ref <130)
Total CHOL/HDL Ratio: 2.4 (calc) (ref <5.0)
Triglycerides: 106 mg/dL (ref <150)

## 2021-04-22 LAB — VITAMIN B12: Vitamin B-12: 390 pg/mL (ref 200–1100)

## 2021-05-29 ENCOUNTER — Other Ambulatory Visit: Payer: Self-pay | Admitting: *Deleted

## 2021-05-29 DIAGNOSIS — Z87891 Personal history of nicotine dependence: Secondary | ICD-10-CM

## 2021-06-07 ENCOUNTER — Other Ambulatory Visit: Payer: Self-pay | Admitting: Family Medicine

## 2021-06-07 DIAGNOSIS — F418 Other specified anxiety disorders: Secondary | ICD-10-CM

## 2021-06-12 ENCOUNTER — Other Ambulatory Visit: Payer: Self-pay

## 2021-06-12 ENCOUNTER — Ambulatory Visit
Admission: RE | Admit: 2021-06-12 | Discharge: 2021-06-12 | Disposition: A | Discharge status: Home / Self Care | Payer: BC Managed Care – PPO | Source: Ambulatory Visit | Attending: Acute Care | Admitting: Acute Care

## 2021-06-12 DIAGNOSIS — Z87891 Personal history of nicotine dependence: Secondary | ICD-10-CM | POA: Diagnosis present

## 2021-06-12 IMAGING — CT CT CHEST LUNG CANCER SCREENING LOW DOSE W/O CM
2 of 5 series · 15 of 40 positions shown, 18 images · non-contrast
Comparison: [DATE]

CLINICAL DATA: Lung cancer screening. Former asymptomatic smoker.
Thirty pack-year history.



[Series 3: lung 1.00 · axial · 0.77mm/px · z∈[-1228,-917]mm · 12 of 343 slices shown, 15 images]
[im 16/343  mediastinal]
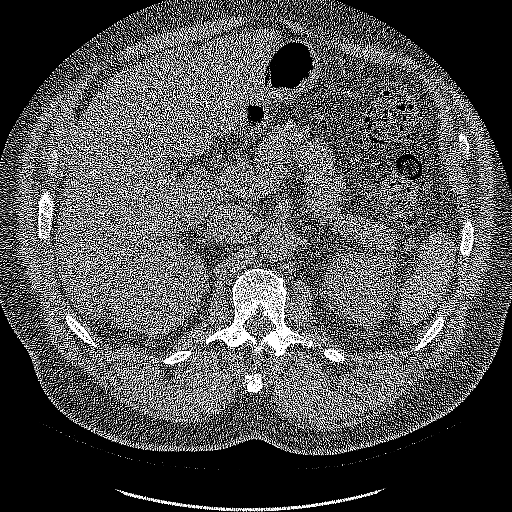
[im 16/343  lung]
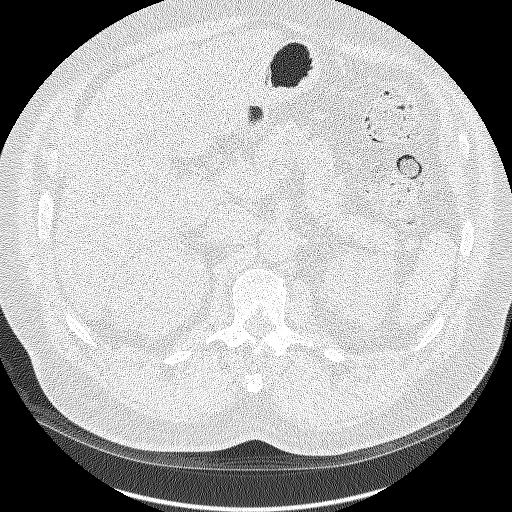
[im 47/343  lung]
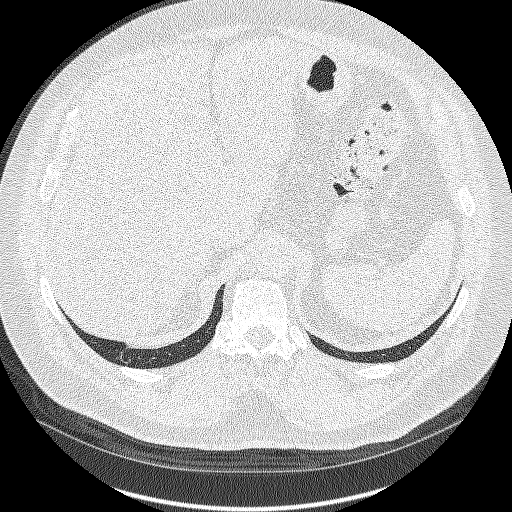
[im 78/343  lung]
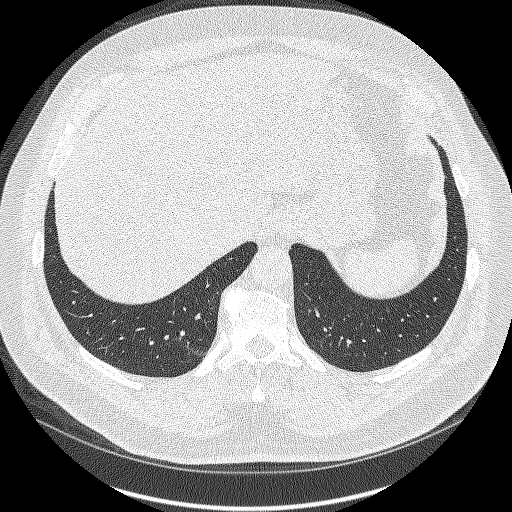
[im 109/343  lung]
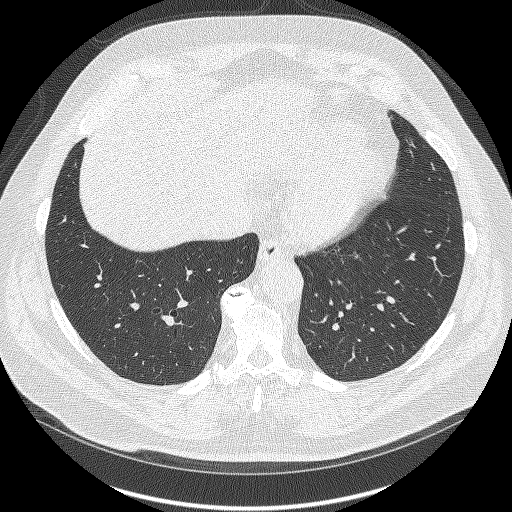
[im 125/343  mediastinal]
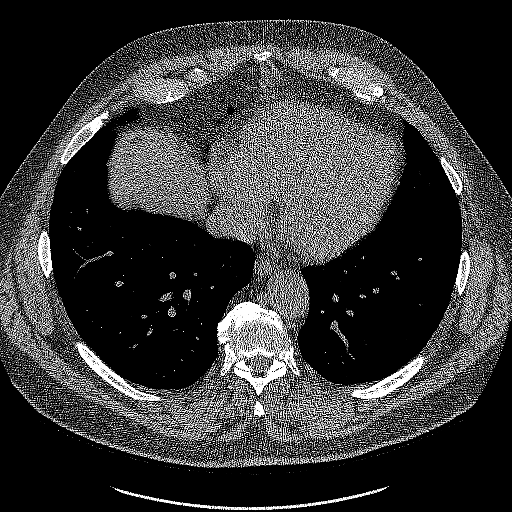
[im 125/343  lung]
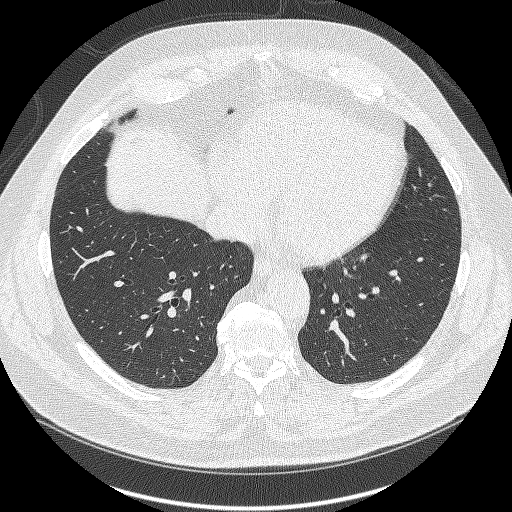
[im 156/343  lung]
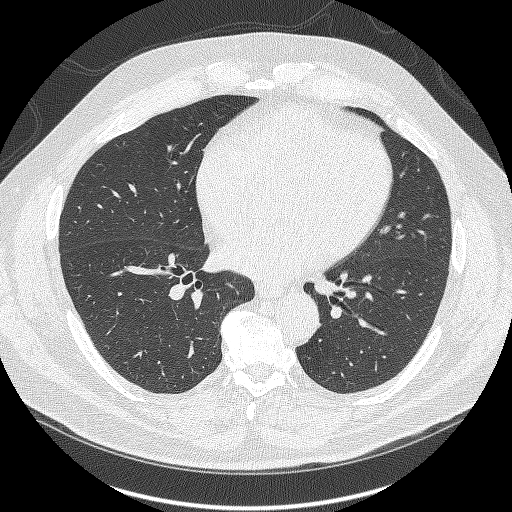
[im 187/343  lung]
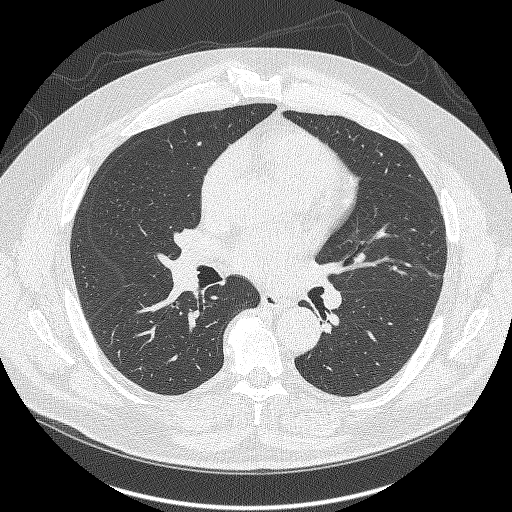
[im 218/343  lung]
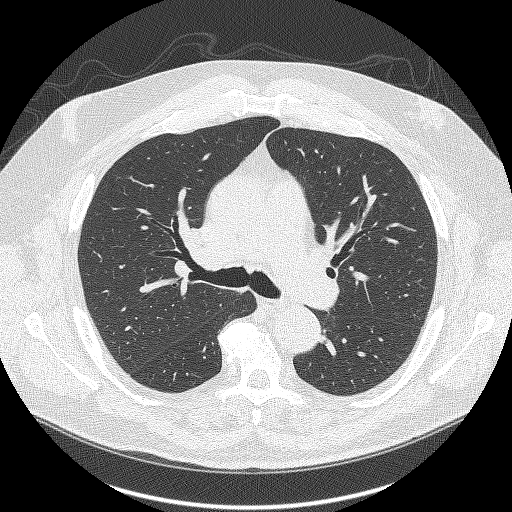
[im 234/343  mediastinal]
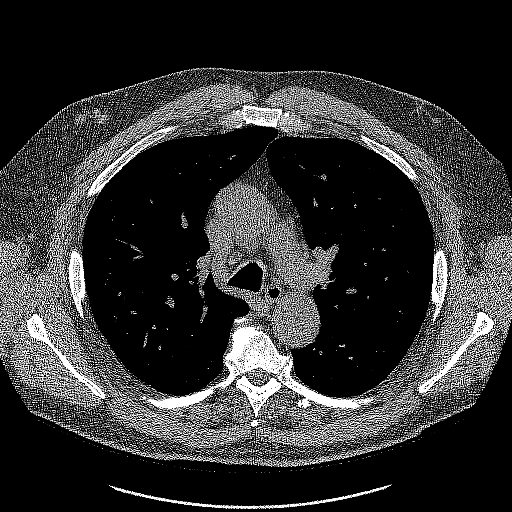
[im 234/343  lung]
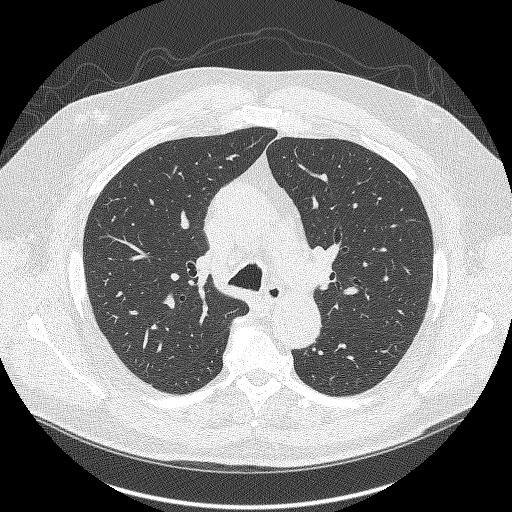
[im 265/343  lung]
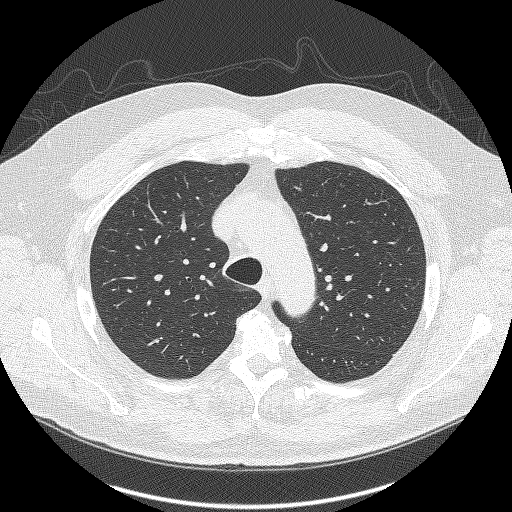
[im 296/343  lung]
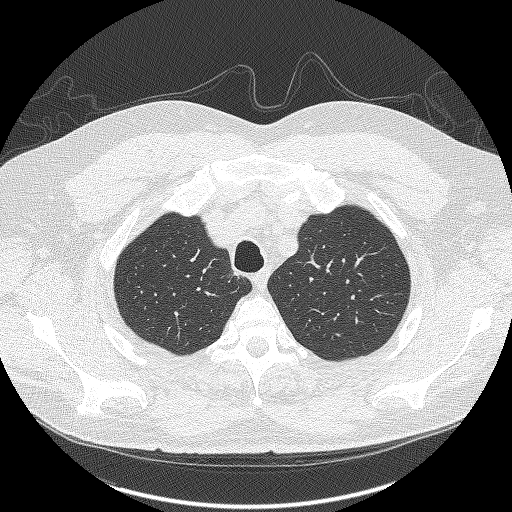
[im 327/343  lung]
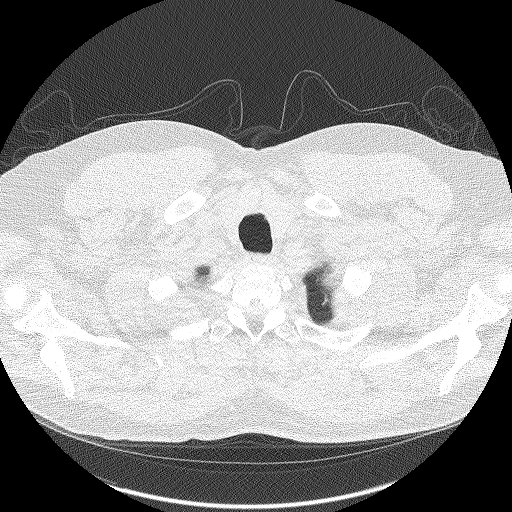

[Series 5: coronals lung 1.00 cor · coronal · 0.67mm/px · 3 of 392 slices shown]
[im 79/392  lung]
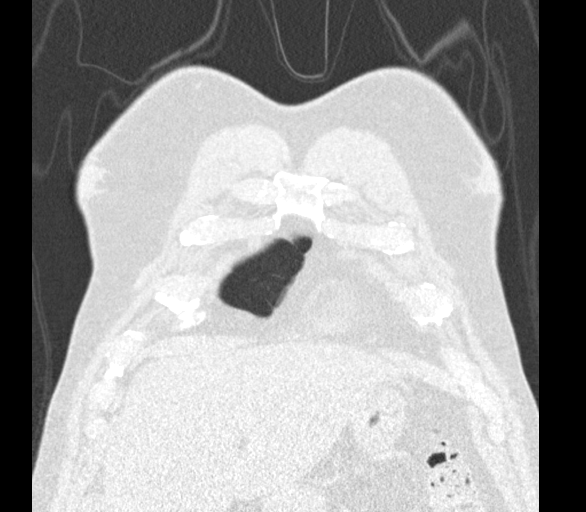
[im 157/392  lung]
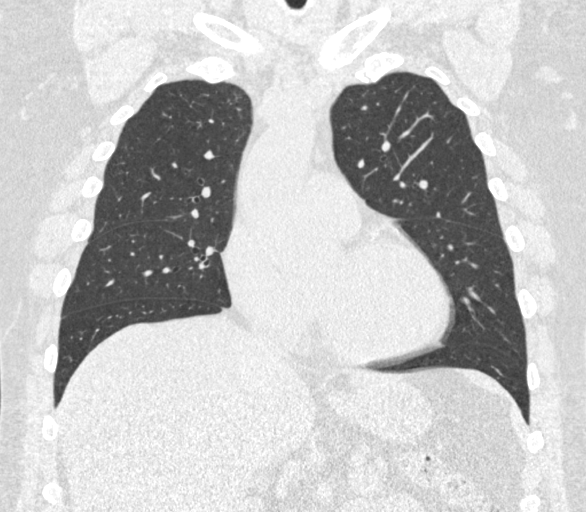
[im 235/392  lung]
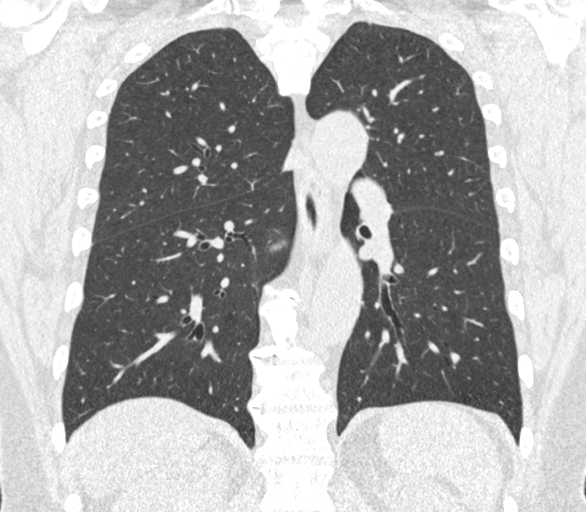

[15 of 40 positions shown; findings below may reference images not displayed]

FINDINGS: Cardiovascular: The heart size appears normal. No pericardial
effusion. Aortic atherosclerosis and coronary artery calcifications.

Mediastinum/Nodes: No enlarged mediastinal, hilar, or axillary lymph
nodes. Thyroid gland, trachea, and esophagus demonstrate no
significant findings.

Lungs/Pleura: No pleural effusion or airspace consolidation. Mild
centrilobular emphysema. No suspicious pulmonary nodule or mass
identified.

Upper Abdomen: Hepatic steatosis.  No acute abnormality.

Musculoskeletal: No chest wall mass or suspicious bone lesions
identified.
IMPRESSION: 1. Lung-RADS 1, negative. Continue annual screening with low-dose
chest CT without contrast in 12 months.
2. Hepatic steatosis.
3. Aortic Atherosclerosis ([BG]-[BG]) and Emphysema ([BG]-[BG]).

## 2021-06-15 ENCOUNTER — Other Ambulatory Visit: Payer: Self-pay

## 2021-06-15 DIAGNOSIS — Z87891 Personal history of nicotine dependence: Secondary | ICD-10-CM

## 2021-08-03 ENCOUNTER — Other Ambulatory Visit: Payer: Self-pay | Admitting: Family Medicine

## 2021-08-03 DIAGNOSIS — J302 Other seasonal allergic rhinitis: Secondary | ICD-10-CM

## 2021-08-03 DIAGNOSIS — I10 Essential (primary) hypertension: Secondary | ICD-10-CM

## 2021-08-17 NOTE — Progress Notes (Signed)
Name: Isaac Cross   MRN: [id number]    DOB: October 28, 1960   Date:08/18/2021 ? ?     Progress Note ? ?Subjective ? ?Chief Complaint ? ?Follow Up ? ?HPI ? ?S/P bilateral total knee Replacements:  It was done by  Dr. Harlow Mares on May 15th, 2020 in [place]. He has been going for walks, denies any pain  ?  ?COPD/Asthma:usually moderate but currently under control with medication,, usually only uses it every other day,  he  denies cough, SOB or wheezing at this time .  Unchanged ? ?AR: he is taking Xyzal and using nasal spray, symptoms worse this time of the year with rhinorrhea and nasal congestion  ? ?OSA: he wears it every night CPAP machine, he denies waking up with headaches, he has been feeling rested when he gets up.  ?  ?DMII:   A1C went from 6.4 % to 6.9 % but last level was around the holidays, he is following a diabetic diet again and we will recheck labs. He is eating more at home, retired in March 2022   He is taking Metformin 1000 mg BID. He denies polyphagia, polydipsia or polyuria . Urine micro is up to date and negative but is due for repeat  . He has associated dyslipidemia, obesity , HTN . He has intermittent stabbing on his feet.  ?  ?HTN: he is taking lisinopril 20 mg and bp is at goal , no dizziness, chest pain or palpitation .  ?  ?Atherosclerosis abdominal aorta: taking statin therapy, last LDL was 73, he is also on aspirin 81 mg , we will recheck labs and if still not at goal we will increase dose to 20 mg  ?  ?Adrenal insufficiency/Adrenal mass bilaterally : size of adrenal glands improved still taking medication given by Dr. Honor Junes , it happened while hospitalized and masses likely from subdural hematoma and he is doing well on medications.  ?  ?Depression/anxiety: doing well on medication. Phq 9 is negative. Retired, he was volunteering for a period of time, he would like to go back to work part time. He is doing well. He is taking medication and does not want to stop at this time.  ? ?LUTS: he  states symptoms started after hospitalization and ICU stay back in 2020, he thought it was going to improve but symptoms are getting progressively worse and sometimes he has dribbling on his underwear and is starting to bother him more  ? ?IPSS Questionnaire (AUA-7): ?Over the past month?   ?1)  How often have you had a sensation of not emptying your bladder completely after you finish urinating?  3 - About half the time  ?2)  How often have you had to urinate again less than two hours after you finished urinating? 0 - Not at all  ?3)  How often have you found you stopped and started again several times when you urinated?  0 - Not at all  ?4) How difficult have you found it to postpone urination?  0 - Not at all  ?5) How often have you had a weak urinary stream?  5 - Almost always  ?6) How often have you had to push or strain to begin urination?  3 - About half the time  ?7) How many times did you most typically get up to urinate from the time you went to bed until the time you got up in the morning?  2 - 2 times  ?Total score:  0-7  mildly symptomatic  ? 8-19 moderately symptomatic  ? 20-35 severely symptomatic  ?  ? ?Patient Active Problem List  ? Diagnosis Date Noted  ? History of total knee arthroplasty 09/04/2018  ? Coronary atherosclerosis due to calcified coronary lesion 12/02/2017  ? Atherosclerosis of abdominal aorta ([place]) 06/17/2015  ? Fatty liver disease, nonalcoholic 01/74/9449  ? Renal cyst, right 06/17/2015  ? BPH (benign prostatic hyperplasia) 05/25/2015  ? Microscopic hematuria 05/25/2015  ? Second degree hemorrhoids   ? Diverticulosis of large intestine without diverticulitis   ? Asthma, mild intermittent, well-controlled 10/16/2014  ? Allergic rhinitis 10/13/2014  ? ED (erectile dysfunction) of organic origin 07/22/2014  ? Gastro-esophageal reflux disease without esophagitis 07/22/2014  ? Benign hypertension 07/22/2014  ? 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 Adult BMI  30+ 07/22/2014  ? Chronic obstructive asthma ([place]) 07/22/2014  ? Depression with anxiety 07/22/2014  ? Dyslipidemia 07/22/2014  ? Genital herpes 07/22/2014  ? 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  ? Osteoarthritis of both knees 07/22/2014  ? Obstructive apnea 05/14/2013  ? 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 Blood in the urine 09/20/2006  ? ? ?Past Surgical History:  ?Procedure Laterality Date  ? BLADDER REPAIR    ? COLONOSCOPY WITH PROPOFOL N/A 04/07/2015  ? Procedure: COLONOSCOPY WITH PROPOFOL;  Surgeon: Lucilla Lame, MD;  Location: [facility];  Service: Endoscopy;  Laterality: N/A;  Diabetic - oral meds ?CPAP  ? HAND SURGERY    ? 4TH AND 5TH FINGER  ? I & D KNEE WITH POLY EXCHANGE Right 09/18/2018  ? Procedure: IRRIGATION AND DEBRIDEMENT KNEE WITH POLY EXCHANGE;  Surgeon: Lovell Sheehan, MD;  Location: [facility] ORS;  Service: Orthopedics;  Laterality: Right;  ? KNEE ARTHROSCOPY Right   ? 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  ? TOTAL KNEE ARTHROPLASTY Left 09/17/2018  ? Procedure: I &D left knee and polyethylene exchange;  Surgeon: Lovell Sheehan, MD;  Location: [facility] ORS;  Service: Orthopedics;  Laterality: Left;  ? VEIN SURGERY Right   ? ? ?Family History  ?Problem Relation Age of Onset  ? Hypertension Mother   ? Diabetes Mother   ? Diabetes Father   ? Hypertension Father   ? Cancer Father   ? Seizures Daughter   ? Lupus Daughter   ? Cancer Maternal Grandfather   ?     Prostate  ? Diabetes Paternal Grandfather   ? Prostate cancer Maternal Uncle   ? Bladder Cancer Neg Hx   ? Kidney cancer Neg Hx   ? ? ?Social History  ? ?Tobacco Use  ? Smoking status: Former  ?  Packs/day: 1.00  ?  Years: 10.00  ?  Pack years: 10.00  ?  Types: Cigarettes  ?  Start date: 04/20/1995  ?  Quit date: 04/19/2005  ?  Years since quitting: 16.3  ? Smokeless tobacco: Never  ?Substance Use Topics  ? Alcohol use: Yes  ?  Alcohol/week: 10.0 standard drinks  ?  Types: 10 Cans of beer per week  ?  Comment: occasional   ? ? ? ?Current Outpatient Medications:  ?  acetaminophen (TYLENOL) 500 MG tablet, Take 1 tablet (500 mg total) by mouth every 6 (six) hours as needed., Disp: 90 tablet, Rfl: 0 ?  acetic acid-hydrocortisone (VOSOL-HC) OTIC solution, Place 3 drops into both ears 2 (two) times daily., Disp: 10 mL, Rfl: 0 ?  acidophilus (RISAQUAD) CAPS capsule, Take 1 capsule by mouth daily., Disp: 30 capsule, Rfl: 0 ?  albuterol (VENTOLIN HFA) 108 (90 Base) MCG/ACT inhaler, INHALE 2 PUFFS INTO THE LUNGS 4 (FOUR) TIMES DAILY., Disp: 18 g, Rfl: 2 ?  Azelastine-Fluticasone 137-50 MCG/ACT SUSP, Place 1 spray into the nose every 12 (twelve) hours., Disp: 23 g, Rfl: 2 ?  citalopram (CELEXA) 20 MG tablet, TAKE 1 TABLET BY MOUTH EVERY DAY, Disp: 90 tablet, Rfl: 1 ?  diclofenac Sodium (VOLTAREN) 1 % GEL, Apply 4 g topically 4 (four) times daily., Disp: 300 g, Rfl: 1 ?  fludrocortisone (FLORINEF) 0.1 MG tablet, Take 1 tablet (0.1 mg total) by mouth daily., Disp: 60 tablet, Rfl: 0 ?  fluticasone furoate-vilanterol (BREO ELLIPTA) 100-25 MCG/INH AEPB, Inhale 1 puff into the lungs daily., Disp: 180 each, Rfl: 1 ?  hydrocortisone (CORTEF) 20 MG tablet, Take 0.5-1 tablets (10-20 mg total) by mouth 2 (two) times daily. 1 in am and half at night, Disp: 60 tablet, Rfl: 0 ?  levocetirizine (XYZAL) 5 MG tablet, TAKE 1 TABLET BY MOUTH EVERY DAY IN THE EVENING, Disp: 90 tablet, Rfl: 1 ?  lisinopril (ZESTRIL) 20 MG tablet, TAKE 1 TABLET BY MOUTH EVERY DAY, Disp: 90 tablet, Rfl: 1 ?  metFORMIN (GLUCOPHAGE) 1000 MG tablet, Take 1 tablet (1,000 mg total) by mouth 2 (two) times daily with a meal., Disp: 180 tablet, Rfl: 1 ?  Multiple Vitamins-Minerals (MENS MULTIVITAMIN PLUS) TABS, Take by mouth., Disp: , Rfl:  ?  Omega-3 Fatty Acids (FISH OIL) 1000 MG CAPS, Take by mouth daily., Disp: , Rfl:  ?  rosuvastatin (CRESTOR) 10 MG tablet, TAKE 1 TABLET BY MOUTH EVERY DAY, Disp: 90 tablet, Rfl: 1 ?  SENNA-PLUS 8.6-50 MG tablet, Take 2 tablets by mouth every morning.,  Disp: , Rfl:  ? ?Allergies  ?Allergen Reactions  ? Heparin   ?  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 Lipitor [Atorvastatin] Hives and Itching  ? Other   ? Viagra  [Sildenafil Citrate]   ?  vi

## 2021-08-18 ENCOUNTER — Ambulatory Visit: Payer: BC Managed Care – PPO | Admitting: Family Medicine

## 2021-08-18 ENCOUNTER — Encounter: Payer: Self-pay | Admitting: Family Medicine

## 2021-08-18 VITALS — BP 118/76 | HR 96 | Temp 98.8°F | Resp 16 | Ht 76.0 in | Wt 331.8 lb

## 2021-08-18 DIAGNOSIS — J449 Chronic obstructive pulmonary disease, unspecified: Secondary | ICD-10-CM | POA: Diagnosis not present

## 2021-08-18 DIAGNOSIS — E1129 Type 2 diabetes mellitus with other diabetic kidney complication: Secondary | ICD-10-CM

## 2021-08-18 DIAGNOSIS — E2740 Unspecified adrenocortical insufficiency: Secondary | ICD-10-CM

## 2021-08-18 DIAGNOSIS — R399 Unspecified symptoms and signs involving the genitourinary system: Secondary | ICD-10-CM

## 2021-08-18 DIAGNOSIS — D638 Anemia in other chronic diseases classified elsewhere: Secondary | ICD-10-CM

## 2021-08-18 DIAGNOSIS — I10 Essential (primary) hypertension: Secondary | ICD-10-CM

## 2021-08-18 DIAGNOSIS — E278 Other specified disorders of adrenal gland: Secondary | ICD-10-CM | POA: Diagnosis not present

## 2021-08-18 DIAGNOSIS — G4733 Obstructive sleep apnea (adult) (pediatric): Secondary | ICD-10-CM

## 2021-08-18 DIAGNOSIS — J302 Other seasonal allergic rhinitis: Secondary | ICD-10-CM

## 2021-08-18 DIAGNOSIS — I700 Atherosclerosis of aorta: Secondary | ICD-10-CM | POA: Diagnosis not present

## 2021-08-18 DIAGNOSIS — E785 Hyperlipidemia, unspecified: Secondary | ICD-10-CM

## 2021-08-18 DIAGNOSIS — J3089 Other allergic rhinitis: Secondary | ICD-10-CM

## 2021-08-18 DIAGNOSIS — Z96653 Presence of artificial knee joint, bilateral: Secondary | ICD-10-CM

## 2021-08-18 DIAGNOSIS — Z8042 Family history of malignant neoplasm of prostate: Secondary | ICD-10-CM

## 2021-08-19 LAB — LIPID PANEL
Cholesterol: 161 mg/dL (ref <200)
HDL: 60 mg/dL (ref >=40)
LDL Cholesterol (Calc): 76 mg/dL (calc)
Non-HDL Cholesterol (Calc): 101 mg/dL (calc) (ref <130)
Total CHOL/HDL Ratio: 2.7 (calc) (ref <5.0)
Triglycerides: 146 mg/dL (ref <150)

## 2021-08-19 LAB — COMPLETE METABOLIC PANEL WITH GFR
AG Ratio: 1.7 (calc) (ref 1.0–2.5)
ALT: 30 U/L (ref 9–46)
AST: 27 U/L (ref 10–35)
Albumin: 4.3 g/dL (ref 3.6–5.1)
Alkaline phosphatase (APISO): 37 U/L (ref 35–144)
BUN: 18 mg/dL (ref 7–25)
CO2: 27 mmol/L (ref 20–32)
Calcium: 10.1 mg/dL (ref 8.6–10.3)
Chloride: 105 mmol/L (ref 98–110)
Creat: 1.08 mg/dL (ref 0.70–1.35)
Globulin: 2.5 g/dL (calc) (ref 1.9–3.7)
Glucose, Bld: 122 mg/dL — ABNORMAL HIGH (ref 65–99)
Potassium: 4.7 mmol/L (ref 3.5–5.3)
Sodium: 141 mmol/L (ref 135–146)
Total Bilirubin: 0.5 mg/dL (ref 0.2–1.2)
Total Protein: 6.8 g/dL (ref 6.1–8.1)
eGFR: 77 mL/min/{1.73_m2} (ref >=60)

## 2021-08-19 LAB — CBC WITH DIFFERENTIAL/PLATELET
Absolute Monocytes: 588 cells/uL (ref 200–950)
Basophils Absolute: 32 cells/uL (ref 0–200)
Basophils Relative: 0.6 %
Eosinophils Absolute: 42 cells/uL (ref 15–500)
Eosinophils Relative: 0.8 %
HCT: 40.1 % (ref 38.5–50.0)
Hemoglobin: 13.2 g/dL (ref 13.2–17.1)
Lymphs Abs: 1617 cells/uL (ref 850–3900)
MCH: 27.4 pg (ref 27.0–33.0)
MCHC: 32.9 g/dL (ref 32.0–36.0)
MCV: 83.4 fL (ref 80.0–100.0)
MPV: 10.9 fL (ref 7.5–12.5)
Monocytes Relative: 11.1 %
Neutro Abs: 3021 cells/uL (ref 1500–7800)
Neutrophils Relative %: 57 %
Platelets: 196 10*3/uL (ref 140–400)
RBC: 4.81 10*6/uL (ref 4.20–5.80)
RDW: 13.2 % (ref 11.0–15.0)
Total Lymphocyte: 30.5 %
WBC: 5.3 10*3/uL (ref 3.8–10.8)

## 2021-08-19 LAB — MICROALBUMIN / CREATININE URINE RATIO
Creatinine, Urine: 212 mg/dL (ref 20–320)
Microalb Creat Ratio: 65 mcg/mg creat — ABNORMAL HIGH (ref <30)
Microalb, Ur: 13.8 mg/dL

## 2021-08-19 LAB — HEMOGLOBIN A1C
Hgb A1c MFr Bld: 6.6 % of total Hgb — ABNORMAL HIGH (ref <5.7)
Mean Plasma Glucose: 143 mg/dL
eAG (mmol/L): 7.9 mmol/L

## 2021-08-19 LAB — PSA: PSA: 1.32 ng/mL (ref <=4.00)

## 2021-08-19 NOTE — Progress Notes (Signed)
? ?5/04//2023 ?9:22 AM  ? ?Isaac Cross ?[birth year] ?[id number] ? ?Referring provider:  ?Isaac Sizer, MD ?[address] ?[address] ?[place],  [place] [address] ?Chief Complaint  ?Patient presents with  ? Benign Prostatic Hypertrophy  ? ? ?HPI: ?Isaac Cross is a 61 y.o.male who presents today for further evaluation of lower urinary tract symptoms.  ? ?He was last seen in clinic on and was noted to have a small medium lobe consistent with outlet obstruction back in 2017. He has had multiple imaging that was reviewed today and shows thickened bladder wall.  ? ?He was seen by his PCP, Dr Isaac Cross, on 08/18/2021, he was noted to have LUTS that started after hospitalization and ICU stay back in 2020, he thought it was going to improve but symptoms are getting progressively worse and sometimes he has dribbling on his underwear.  ? ?His most recent PSA is 1.32 on 08/18/2021. He has a previous PSA of 0.7 4 years ago. ? ?He reports that his urinary symptoms have worsened.  ? ?He is not on dialysis and his renal function has returned to near baseline after severe renal failure and temporary dialysis in 2020. ? ? ? IPSS   ? ? [place] Name 08/20/21 0800  ?  ?  ?  ? International Prostate Symptom Score  ? How often have you had the sensation of not emptying your bladder? More than half the time    ? How often have you had to urinate less than every two hours? About half the time    ? How often have you found you stopped and started again several times when you urinated? About half the time    ? How often have you found it difficult to postpone urination? Less than 1 in 5 times    ? How often have you had a weak urinary stream? Almost always    ? How often have you had to strain to start urination? Almost always    ? How many times did you typically get up at night to urinate? 3 Times    ? Total IPSS Score 24    ?  ? Quality of Life due to urinary symptoms  ? If you were to spend the rest of your life with your urinary condition just  the way it is now how would you feel about that? Unhappy    ? ?  ?  ? ?  ? ? ?Score:  ?1-7 Mild ?8-19 Moderate ?20-35 Severe ? ? ?PMH: ?Past Medical History:  ?Diagnosis Date  ? Allergy   ? Anxiety   ? Arthritis   ? knees  ? Asthma, mild intermittent, well-controlled   ? Chronic obstructive asthma ([place])   ? COPD (chronic obstructive pulmonary disease) ([place])   ? Depression with anxiety   ? Diabetes ([place])   ? GERD (gastroesophageal reflux disease)   ? HBP (high blood pressure)   ? High cholesterol   ? Renal cyst, right   ? Sleep apnea   ? CPAP  ? Swelling   ? ? ?Surgical History: ?Past Surgical History:  ?Procedure Laterality Date  ? BLADDER REPAIR    ? COLONOSCOPY WITH PROPOFOL N/A 04/07/2015  ? Procedure: COLONOSCOPY WITH PROPOFOL;  Surgeon: Lucilla Lame, MD;  Location: [facility];  Service: Endoscopy;  Laterality: N/A;  Diabetic - oral meds ?CPAP  ? HAND SURGERY    ? 4TH AND 5TH FINGER  ? I & D KNEE WITH POLY EXCHANGE Right 09/18/2018  ?  Procedure: IRRIGATION AND DEBRIDEMENT KNEE WITH POLY EXCHANGE;  Surgeon: Lovell Sheehan, MD;  Location: [facility];  Service: Orthopedics;  Laterality: Right;  ? KNEE ARTHROSCOPY Right   ? 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  ? TOTAL KNEE ARTHROPLASTY Left 09/17/2018  ? Procedure: I &D left knee and polyethylene exchange;  Surgeon: Lovell Sheehan, MD;  Location: [facility];  Service: Orthopedics;  Laterality: Left;  ? VEIN SURGERY Right   ? ? ?Home Medications:  ?Allergies as of 08/20/2021   ? ?   Reactions  ? Heparin   ? 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 Lipitor [atorvastatin] Hives, Itching  ? Other   ? Viagra  [sildenafil Citrate]   ? vision loss  ? ?  ? ?  ?Medication List  ?  ? ?  ? Accurate as of Aug 20, 2021  9:22 AM. If you have any questions, ask your nurse or doctor.  ?  ?  ? ?  ? ?acetaminophen 500 MG tablet ?Commonly known as: TYLENOL ?Take 1 tablet (500 mg total) by mouth every 6 (six) hours as needed. ?  ?acetic  acid-hydrocortisone OTIC solution ?Commonly known as: VOSOL-HC ?Place 3 drops into both ears 2 (two) times daily. ?  ?acidophilus Caps capsule ?Take 1 capsule by mouth daily. ?  ?albuterol 108 (90 Base) MCG/ACT inhaler ?Commonly known as: VENTOLIN HFA ?INHALE 2 PUFFS INTO THE LUNGS 4 (FOUR) TIMES DAILY. ?  ?Azelastine-Fluticasone 137-50 MCG/ACT Susp ?Place 1 spray into the nose every 12 (twelve) hours. ?  ?Breo Ellipta 100-25 MCG/ACT Aepb ?Generic drug: fluticasone furoate-vilanterol ?Inhale 1 puff into the lungs daily. ?  ?citalopram 20 MG tablet ?Commonly known as: CELEXA ?TAKE 1 TABLET BY MOUTH EVERY DAY ?  ?diclofenac Sodium 1 % Gel ?Commonly known as: Voltaren ?Apply 4 g topically 4 (four) times daily. ?  ?Fish Oil 1000 MG Caps ?Take by mouth daily. ?  ?fludrocortisone 0.1 MG tablet ?Commonly known as: FLORINEF ?Take 1 tablet (0.1 mg total) by mouth daily. ?  ?hydrocortisone 20 MG tablet ?Commonly known as: CORTEF ?Take 0.5-1 tablets (10-20 mg total) by mouth 2 (two) times daily. 1 in am and half at night ?  ?levocetirizine 5 MG tablet ?Commonly known as: XYZAL ?TAKE 1 TABLET BY MOUTH EVERY DAY IN THE EVENING ?  ?lisinopril 20 MG tablet ?Commonly known as: ZESTRIL ?TAKE 1 TABLET BY MOUTH EVERY DAY ?  ?Mens Multivitamin Plus Tabs ?Take by mouth. ?  ?metFORMIN 1000 MG tablet ?Commonly known as: GLUCOPHAGE ?Take 1 tablet (1,000 mg total) by mouth 2 (two) times daily with a meal. ?  ?rosuvastatin 10 MG tablet ?Commonly known as: CRESTOR ?TAKE 1 TABLET BY MOUTH EVERY DAY ?  ?Senna-Plus 8.6-50 MG tablet ?Generic drug: senna-docusate ?Take 2 tablets by mouth every morning. ?  ? ?  ? ? ?Allergies:  ?Allergies  ?Allergen Reactions  ? Heparin   ?  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 Lipitor [Atorvastatin] Hives and Itching  ? Other   ? Viagra  [Sildenafil Citrate]   ?  vision loss  ? ? ?Family History: ?Family History  ?Problem Relation Age of Onset  ? Hypertension Mother   ? Diabetes Mother   ?  Diabetes Father   ? Hypertension Father   ? Cancer Father   ? Seizures Daughter   ? Lupus Daughter   ? Cancer Maternal Grandfather   ?     Prostate  ? Diabetes Paternal Grandfather   ?  Prostate cancer Maternal Uncle   ? Bladder Cancer Neg Hx   ? Kidney cancer Neg Hx   ? ? ?Social History:  reports that he quit smoking about 16 years ago. His smoking use included cigarettes. He started smoking about 26 years ago. He has a 10.00 pack-year smoking history. He has never used smokeless tobacco. He reports current alcohol use of about 10.0 standard drinks per week. He reports that he does not use drugs. ? ? ?Physical Exam: ?BP (!) 148/83   Pulse 99   Ht '6\' 5"'$  (1.956 m)   Wt (!) 331 lb (150.1 kg)   BMI 39.25 kg/m?   ?Constitutional:  Alert and oriented, No acute distress. ?HEENT: [place] AT, moist mucus membranes.  Trachea midline, no masses. ?Cardiovascular: No clubbing, cyanosis, or edema. ?Respiratory: Normal respiratory effort, no increased work of breathing. ?Skin: No rashes, bruises or suspicious lesions. ?Neurologic: Grossly intact, no focal deficits, moving all 4 extremities. ?Psychiatric: Normal mood and affect. ? ?Laboratory Data: ? ?Lab Results  ?Component Value Date  ? CREATININE 1.08 08/18/2021  ? ?Lab Results  ?Component Value Date  ? HGBA1C 6.6 (H) 08/18/2021  ? ? ?Urinalysis ?Pertinent Imaging: ?Results for orders placed or performed in visit on 08/20/21  ?Microscopic Examination  ? Urine  ?Result Value Ref Range  ? WBC, UA 0-5 0 - 5 /hpf  ? RBC 3-10 (A) 0 - 2 /hpf  ? Epithelial Cells (non renal) 0-10 0 - 10 /hpf  ? Bacteria, UA Few None seen/Few  ?Urinalysis, Complete  ?Result Value Ref Range  ? Specific Gravity, UA 1.025 1.005 - 1.030  ? pH, UA 5.5 5.0 - 7.5  ? Color, UA Yellow Yellow  ? Appearance Ur Clear Clear  ? Leukocytes,UA Negative Negative  ? Protein,UA 2+ (A) Negative/Trace  ? Glucose, UA Negative Negative  ? Ketones, UA Trace (A) Negative  ? RBC, UA 2+ (A) Negative  ? Bilirubin, UA Negative  Negative  ? Urobilinogen, Ur 0.2 0.2 - 1.0 mg/dL  ? Nitrite, UA Negative Negative  ? Microscopic Examination See below:   ? ? ? ?Assessment & Plan:   ?BPH with LUTS  ?- Noted to have mediun lobe in 2017  ?- Discussed the pathophys

## 2021-08-20 ENCOUNTER — Ambulatory Visit: Payer: BC Managed Care – PPO | Admitting: Urology

## 2021-08-20 ENCOUNTER — Encounter: Payer: Self-pay | Admitting: Urology

## 2021-08-20 VITALS — BP 148/83 | HR 99 | Ht 77.0 in | Wt 331.0 lb

## 2021-08-20 DIAGNOSIS — N401 Enlarged prostate with lower urinary tract symptoms: Secondary | ICD-10-CM

## 2021-08-20 DIAGNOSIS — R3129 Other microscopic hematuria: Secondary | ICD-10-CM | POA: Diagnosis not present

## 2021-08-20 DIAGNOSIS — R350 Frequency of micturition: Secondary | ICD-10-CM

## 2021-08-20 LAB — URINALYSIS, COMPLETE
Bilirubin, UA: NEGATIVE
Glucose, UA: NEGATIVE
Leukocytes,UA: NEGATIVE
Nitrite, UA: NEGATIVE
Specific Gravity, UA: 1.025 (ref 1.005–1.030)
Urobilinogen, Ur: 0.2 mg/dL (ref 0.2–1.0)
pH, UA: 5.5 (ref 5.0–7.5)

## 2021-08-20 LAB — MICROSCOPIC EXAMINATION

## 2021-08-20 NOTE — Patient Instructions (Addendum)
Cystoscopy ?Cystoscopy is a procedure that is used to help diagnose and sometimes treat conditions that affect the lower urinary tract. The lower urinary tract includes the bladder and the urethra. The urethra is the tube that drains urine from the bladder. Cystoscopy is done using a thin, tube-shaped instrument with a light and camera at the end (cystoscope). The cystoscope may be hard or flexible, depending on the goal of the procedure. The cystoscope is inserted through the urethra, into the bladder. ?Cystoscopy may be recommended if you have: ?Urinary tract infections that keep coming back. ?Blood in the urine (hematuria). ?An inability to control when you urinate (urinary incontinence) or an overactive bladder. ?Unusual cells found in a urine sample. ?A blockage in the urethra, such as a urinary stone. ?Painful urination. ?An abnormality in the bladder found during an intravenous pyelogram (IVP) or CT scan. ?What are the risks? ?Generally, this is a safe procedure. However, problems may occur, including: ?Infection. ?Bleeding. ? ?What happens during the procedure? ? ?You will be given one or more of the following: ?A medicine to numb the area (local anesthetic). ?The area around the opening of your urethra will be cleaned. ?The cystoscope will be passed through your urethra into your bladder. ?Germ-free (sterile) fluid will flow through the cystoscope to fill your bladder. The fluid will stretch your bladder so that your health care provider can clearly examine your bladder walls. ?Your doctor will look at the urethra and bladder. ?The cystoscope will be removed ?The procedure may vary among health care providers  ?What can I expect after the procedure? ?After the procedure, it is common to have: ?Some soreness or pain in your urethra. ?Urinary symptoms. These include: ?Mild pain or burning when you urinate. Pain should stop within a few minutes after you urinate. This may last for up to a few days after the  procedure. ?A small amount of blood in your urine for several days. ?Feeling like you need to urinate but producing only a small amount of urine. ?Follow these instructions at home: ?General instructions ?Return to your normal activities as told by your health care provider.  ?Drink plenty of fluids after the procedure. ?Keep all follow-up visits as told by your health care provider. This is important. ?Contact a health care provider if you: ?Have pain that gets worse or does not get better with medicine, especially pain when you urinate lasting longer than 72 hours after the procedure. ?Have trouble urinating. ?Get help right away if you: ?Have blood clots in your urine. ?Have a fever or chills. ?Are unable to urinate. ?Summary ?Cystoscopy is a procedure that is used to help diagnose and sometimes treat conditions that affect the lower urinary tract. ?Cystoscopy is done using a thin, tube-shaped instrument with a light and camera at the end. ?After the procedure, it is common to have some soreness or pain in your urethra. ?It is normal to have blood in your urine after the procedure.  ?If you were prescribed an antibiotic medicine, take it as told by your health care provider.  ?This information is not intended to replace advice given to you by your health care provider. Make sure you discuss any questions you have with your health care provider. ?Document Revised: 03/28/2018 Document Reviewed: 03/28/2018 ?Elsevier Patient Education ? [place]. ?Transrectal Ultrasound ? ?A transrectal ultrasound is a procedure that uses sound waves to create images of the prostate gland and nearby tissues. For this procedure, an ultrasound probe is placed in the  rectum. The probe sends sound waves through the wall of the rectum into the prostate gland. ?The prostate is a walnut-sized gland that is located below the bladder and in front of the rectum. The images show the size and shape of the prostate gland and nearby  structures. You may need this test if you have: ?Trouble urinating. ?Trouble getting your partner pregnant (infertility). ?An abnormal result from a prostate screening exam. ?Tell a health care provider about: ?Any allergies you have. ?All medicines you are taking, including vitamins, herbs, eye drops, creams, and over-the-counter medicines. ?Any bleeding problems you have. ?Any surgeries you have had. ?Any medical conditions you have. ?Any prostate infections you have had. ?What are the risks? ?Generally, this is a safe procedure. However, problems may occur, including: ?Discomfort during the procedure. ?Blood in your urine or sperm after the procedure. This may occur if a sample of tissue is taken to look at under a microscope (biopsy) during the procedure. ?What happens before the procedure? ?Your health care provider may instruct you to use an enema 1-4 hours before the procedure. Follow instructions from your health care provider about how to do the enema. ?Ask your health care provider about: ?Changing or stopping your regular medicines. This is especially important if you are taking diabetes medicines or blood thinners. ?Taking medicines such as aspirin and ibuprofen. These medicines can thin your blood. Do not take these medicines unless your health care provider tells you to take them. ?Taking over-the-counter medicines, vitamins, herbs, and supplements. ?What happens during the procedure? ?You will be asked to lie down on your left side on an exam table. ?You will bend your knees toward your chest. ?Gel will be put on a small probe that is about the width of a finger. The probe will be gently inserted into your rectum. You may have a feeling of fullness but should not feel pain. ?The probe will send signals to a computer that will create images. These will be displayed on a monitor that looks like a small television screen. ?The technician will slightly rotate the probe throughout the procedure. While  rotating the probe, he or she will view and capture images of the prostate gland and the surrounding structures from different angles. ?Your health care provider may take a biopsy sample of prostate tissue during the procedure. The images captured from the ultrasound will help guide the needle that is used to remove a sample of tissue. The sample will be sent to a lab for testing. ?The probe will be removed. ?The procedure may vary among health care providers and hospitals. ?What can I expect after the procedure? ?It is up to you to get the results of your procedure. Ask your health care provider, or the department that is doing the procedure, when your results will be ready. ?Keep all follow-up visits. This is important. ?Summary ?A transrectal ultrasound is a procedure that uses sound waves to create images of the prostate gland and nearby tissues. ?The images show the size and shape of the prostate gland and nearby structures. ?Before the procedure, ask your health care provider about changing or stopping your regular medicines. This is especially important if you are taking diabetes medicines or blood thinners. ?This information is not intended to replace advice given to you by your health care provider. Make sure you discuss any questions you have with your health care provider. ?Document Revised: 12/17/2020 Document Reviewed: 09/29/2020 ?Elsevier Patient Education ? [place]. ?Kegel Exercises ? ?Kegel exercises  can help strengthen your pelvic floor muscles. The pelvic floor is a group of muscles that support your rectum, small intestine, and bladder. In females, pelvic floor muscles also help support the uterus. These muscles help you control the flow of urine and stool (feces). ?Kegel exercises are painless and simple. They do not require any equipment. Your provider may suggest Kegel exercises to: ?Improve bladder and bowel control. ?Improve sexual response. ?Improve weak pelvic floor muscles after  surgery to remove the uterus (hysterectomy) or after pregnancy, in females. ?Improve weak pelvic floor muscles after prostate gland removal or surgery, in males. ?Kegel exercises involve squeezing your pelvic fl

## 2021-09-01 ENCOUNTER — Encounter: Payer: BC Managed Care – PPO | Admitting: Urology

## 2021-09-08 ENCOUNTER — Ambulatory Visit
Admission: RE | Admit: 2021-09-08 | Discharge: 2021-09-08 | Disposition: A | Discharge status: Home / Self Care | Payer: BC Managed Care – PPO | Source: Ambulatory Visit | Attending: Urology | Admitting: Urology

## 2021-09-08 DIAGNOSIS — R3129 Other microscopic hematuria: Secondary | ICD-10-CM | POA: Insufficient documentation

## 2021-09-08 DIAGNOSIS — N401 Enlarged prostate with lower urinary tract symptoms: Secondary | ICD-10-CM | POA: Insufficient documentation

## 2021-09-08 DIAGNOSIS — R350 Frequency of micturition: Secondary | ICD-10-CM | POA: Diagnosis present

## 2021-09-08 IMAGING — US US RENAL
1 series · 14 of 25 positions shown · non-contrast
Comparison: MRI abdomen [DATE]

CLINICAL DATA: Microscopic hematuria.

EXAM:
RENAL / URINARY TRACT ULTRASOUND COMPLETE

[Series 1: us renal · 0.30mm/px · 14 of 50 slices shown]
[im 1/50]
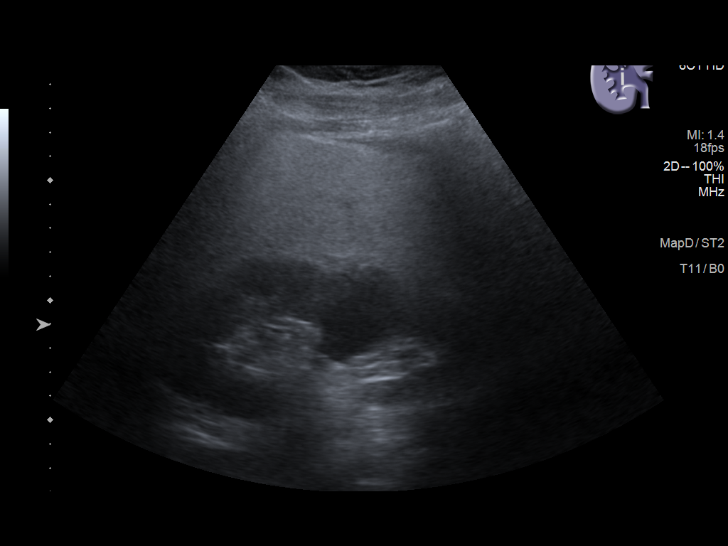
[im 5/50]
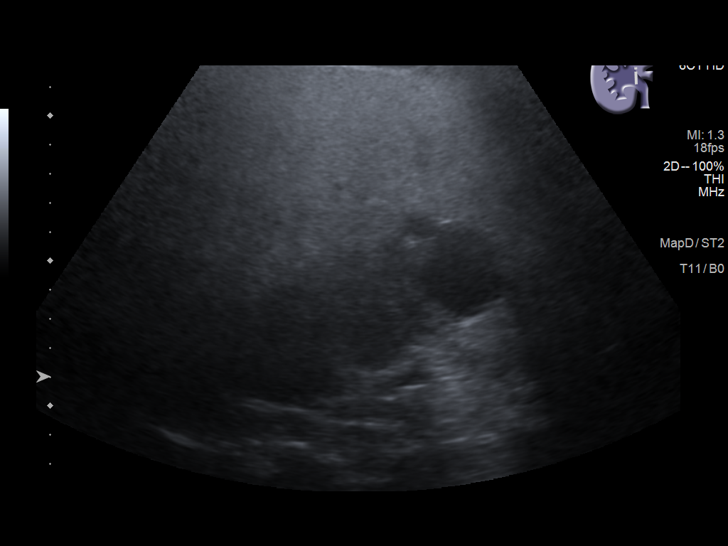
[im 9/50]
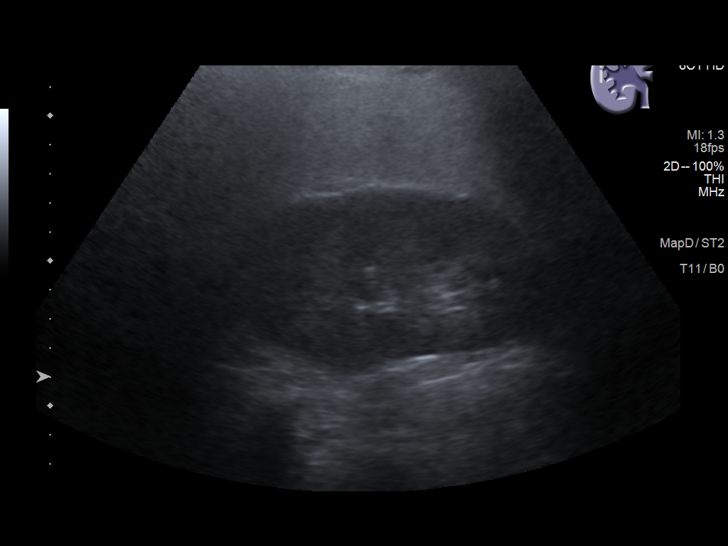
[im 13/50]
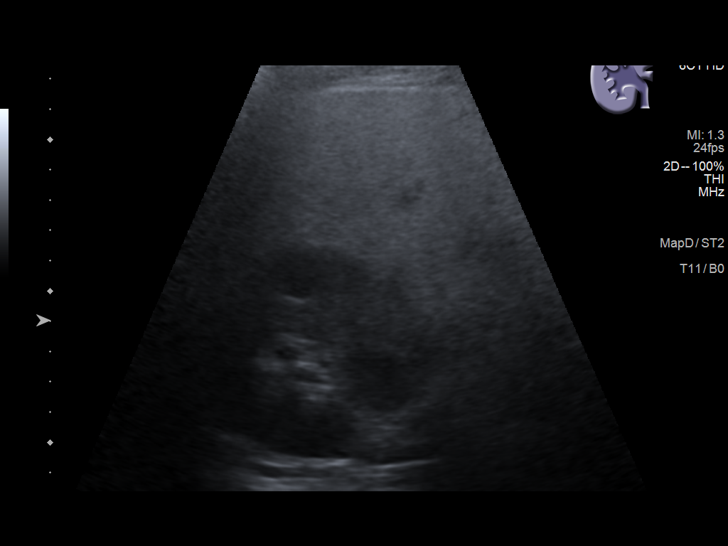
[im 17/50]
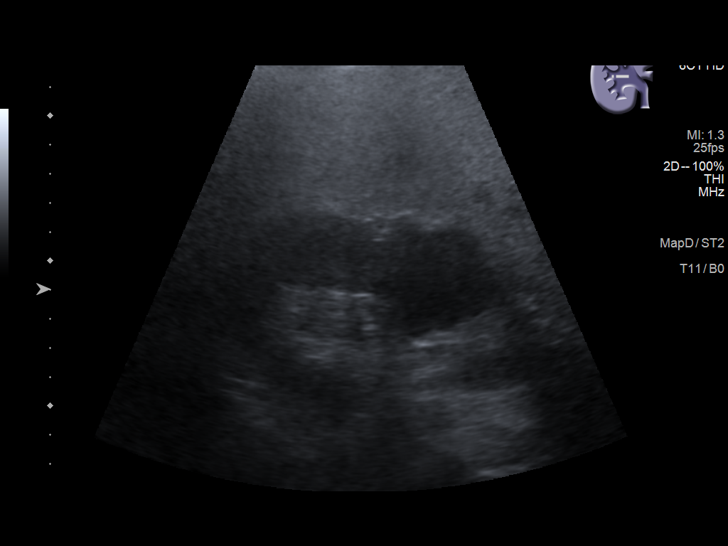
[im 19/50]
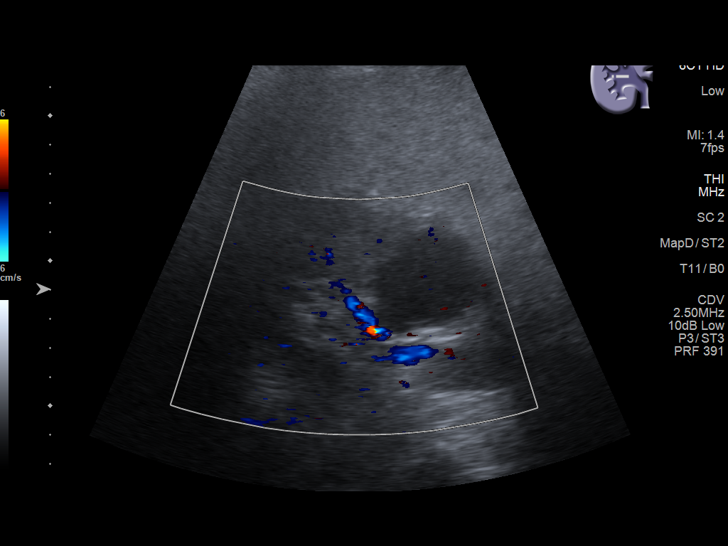
[im 23/50]
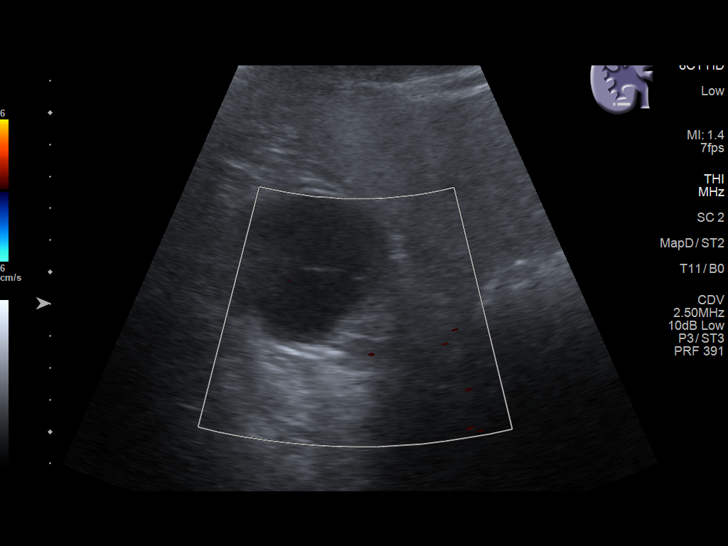
[im 27/50]
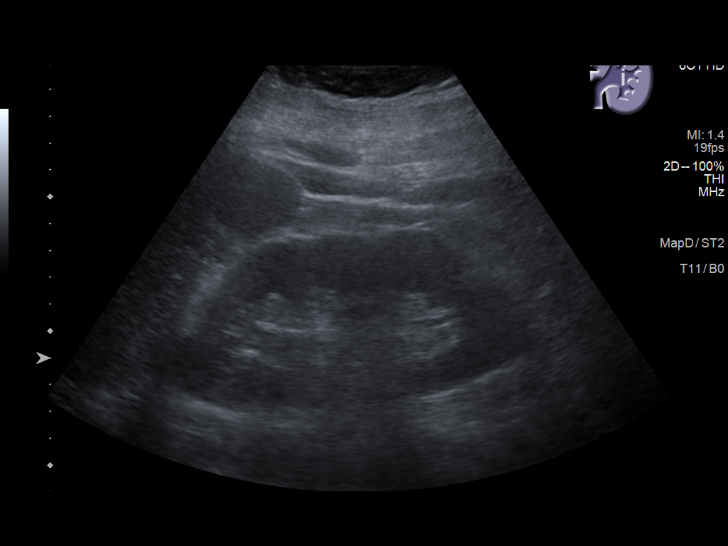
[im 31/50]
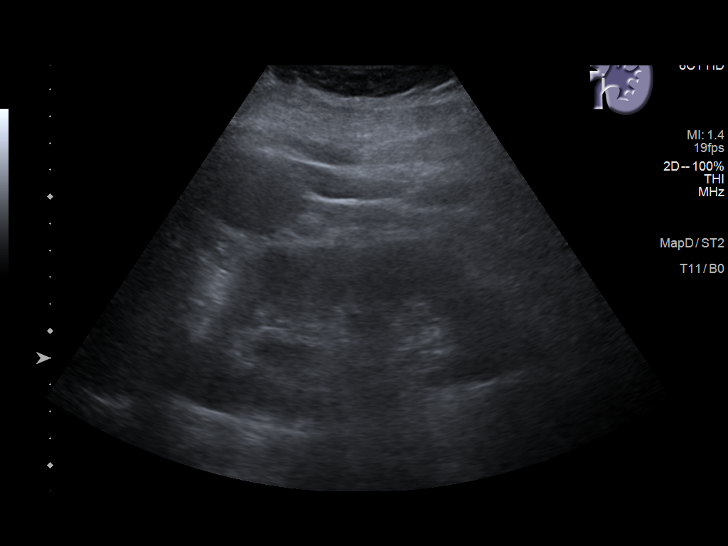
[im 33/50]
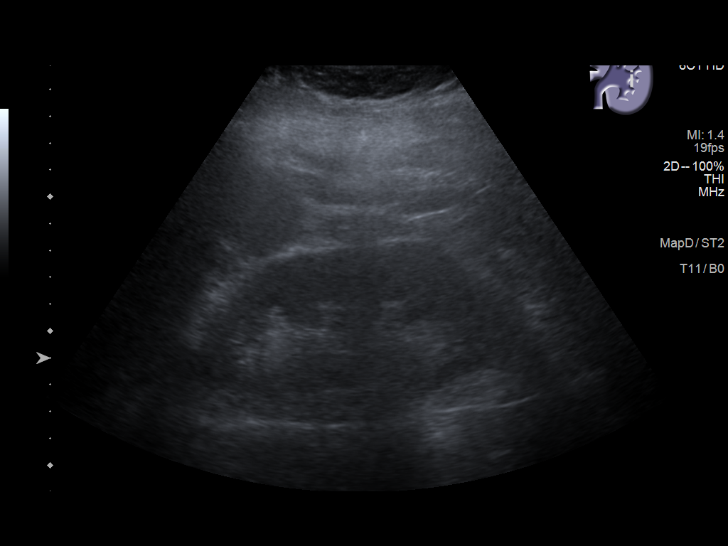
[im 37/50]
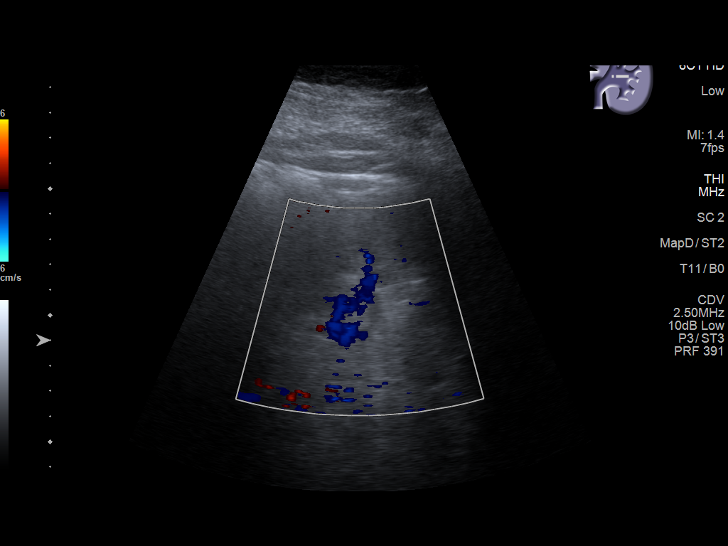
[im 41/50]
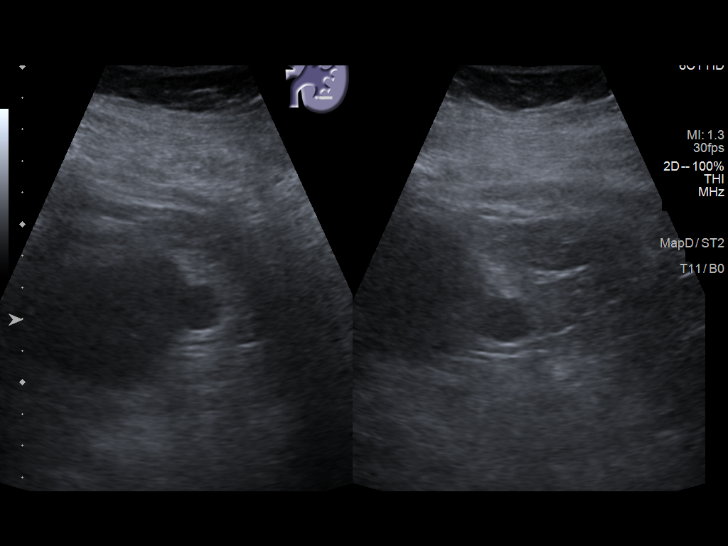
[im 45/50]
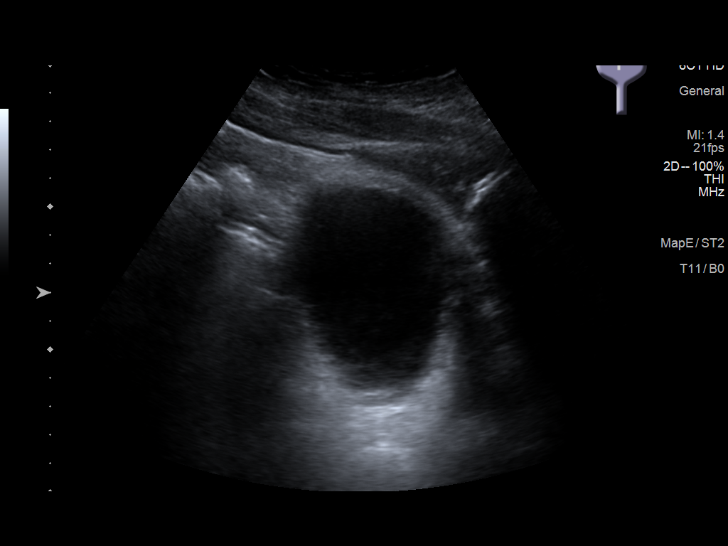
[im 50/50]
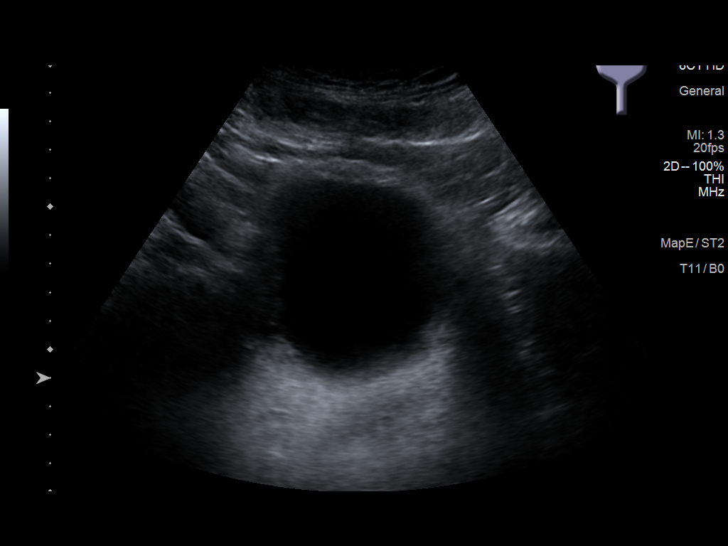

[14 of 25 positions shown; findings below may reference images not displayed]

FINDINGS: Right Kidney:

Renal measurements: 13.7 x 6.7 x 6.2 cm = volume: 297 mL. Normal
renal cortical thickness and echogenicity. No hydronephrosis.
Multiple cysts measuring up to 3.9 cm within the inferior pole.

Left Kidney:

Renal measurements: 13.5 x 6.4 x 6.0 cm = volume: 269 mL. Normal
renal cortical thickness and echogenicity. No hydronephrosis. There
is a cyst within the inferior pole which measures 1.5 x 1.6 x
cm.

Bladder:

Appears normal for degree of bladder distention.

Other:

None.
IMPRESSION: No hydronephrosis.

Bilateral renal cysts.

## 2021-09-09 ENCOUNTER — Encounter: Payer: Self-pay | Admitting: Urology

## 2021-09-09 ENCOUNTER — Ambulatory Visit (INDEPENDENT_AMBULATORY_CARE_PROVIDER_SITE_OTHER): Payer: BC Managed Care – PPO | Admitting: Urology

## 2021-09-09 VITALS — BP 136/83 | HR 105 | Ht 77.0 in | Wt 331.0 lb

## 2021-09-09 DIAGNOSIS — R3129 Other microscopic hematuria: Secondary | ICD-10-CM

## 2021-09-09 DIAGNOSIS — R350 Frequency of micturition: Secondary | ICD-10-CM | POA: Diagnosis not present

## 2021-09-09 DIAGNOSIS — N401 Enlarged prostate with lower urinary tract symptoms: Secondary | ICD-10-CM

## 2021-09-09 NOTE — Patient Instructions (Addendum)
Transurethral Resection of the Prostate ?Transurethral resection of the prostate (TURP) is the removal, or resection, of part of the prostate tissue. This procedure is done to treat an enlarged prostate gland (benign prostatic hyperplasia). ?The goal of TURP is to remove enough prostate tissue to allow for a normal flow of urine. The procedure will allow you to empty your bladder more completely when you urinate so that you can urinate less often. ?In a transurethral resection, a thin telescope with a light, a camera, and an electric cutting edge (resectoscope) is passed through the urethra and into the prostate. The opening of the urethra is at the end of the penis. ?Tell a health care provider about: ?Any allergies you have. ?All medicines you are taking, including vitamins, herbs, eye drops, creams, and over-the-counter medicines. ?Any problems you or family members have had with anesthetic medicines. ?Any bleeding problems you have. ?Any surgeries you have had. ?Any medical conditions you have. ?Any prostate infections you have had. ?What are the risks? ?Generally, this is a safe procedure. However, problems may occur, including: ?Infection. ?Bleeding. ?Allergic reactions to medicines. ?Blood in the urine (hematuria). ?Damage to nearby structures or organs. ?Other problems may occur, but they are rare. They include: ?Dry ejaculation, or having no semen come out during orgasm. ?Erectile dysfunction, or being unable to have or keep an erection. ?Scarring that leads to narrowing of the urethra. This narrowing may block the flow of urine. ?Inability to control when you urinate (incontinence). ?Deep vein thrombosis. This is a blood clot that can develop in your leg. ?TURP syndrome. This can happen when you lose too much sodium during or after the procedure. Some signs and symptoms of this condition include: ?Weakness. ?Headaches. ?Nausea or vomiting. ?Muscle cramping. ?What happens before the procedure? ?When to stop  eating and drinking ?Follow instructions from your health care provider about what you may eat and drink before your procedure. These may include: ?8 hours before your procedure ?Stop eating most foods. Do not eat meat, fried foods, or fatty foods. ?Eat only light foods, such as toast or crackers. ?All liquids are okay except energy drinks and alcohol. ?6 hours before your procedure ?Stop eating. ?Drink only clear liquids, such as water, clear fruit juice, black coffee, plain tea, and sports drinks. ?Do not drink energy drinks or alcohol. ?2 hours before your procedure ?Stop drinking all liquids. ?You may be allowed to take medicines with small sips of water. ?If you do not follow your health care provider's instructions, your procedure may be delayed or canceled. ?Medicines ?Ask your health care provider about: ?Changing or stopping your regular medicines. This is especially important if you are taking diabetes medicines or blood thinners. ?Taking medicines such as aspirin and ibuprofen. These medicines can thin your blood. Do not take these medicines unless your health care provider tells you to take them. ?Taking over-the-counter medicines, vitamins, herbs, and supplements. ?Surgery safety ?Ask your health care provider what steps will be taken to help prevent infection. These steps may include: ?Removing hair at the surgery site. ?Washing skin with a germ-killing soap. ?Taking antibiotic medicine. ?General instructions ?Do not use any products that contain nicotine or tobacco for at least 4 weeks before the procedure. These products include cigarettes, chewing tobacco, and vaping devices, such as e-cigarettes. If you need help quitting, ask your health care provider. ?If you will be going home right after the procedure, plan to have a responsible adult: ?Take you home from the hospital or clinic. You   will not be allowed to drive. ?Care for you for the time you are told. ?What happens during the procedure? ? ?An  IV will be inserted into one of your veins. ?You will be given one or more of the following: ?A medicine to help you relax (sedative). ?A medicine to make you fall asleep (general anesthetic). ?A medicine that is injected into your spine to numb the area below and slightly above the injection site (spinal anesthetic). ?Your legs will be placed in foot rests (stirrups) so that your legs are apart and your knees are bent. ?The resectoscope will be passed through your urethra to your prostate. ?Parts of your prostate will be resected using the cutting edge of the resectoscope. ?Fluid will be passed to rinse out the cut tissues (irrigation). ?The resectoscope will be removed. ?A small, thin tube (catheter) will be passed through your urethra and into your bladder. The catheter will drain urine into a bag outside of your body. ?The procedure may vary among health care providers and hospitals. ?What happens after the procedure? ?Your blood pressure, heart rate, breathing rate, and blood oxygen level will be monitored until you leave the hospital or clinic. ?You will be given fluids through the IV. The IV will be removed when you start eating and drinking normally. ?You may have some pain. Pain medicine will be available to help you. ?You will have a catheter draining your urine. ?You may have blood in your urine. Your catheter may be kept in until your urine is clear. ?Your urinary drainage will be monitored. If necessary, your bladder may be rinsed out (irrigated) through your catheter. ?You will be encouraged to walk around as soon as possible. ?You may have to wear compression stockings. These stockings help to prevent blood clots and reduce swelling in your legs. ?If you were given a sedative during the procedure, it can affect you for several hours. Do not drive or operate machinery until your health care provider says that it is safe. ?Summary ?Transurethral resection of the prostate (TURP) is the removal  (resection) of part of the prostate tissue. ?The goal of this procedure is to remove enough prostate tissue to allow for a normal flow of urine. ?Follow instructions from your health care provider about taking medicines and about eating and drinking before the procedure. ?This information is not intended to replace advice given to you by your health care provider. Make sure you discuss any questions you have with your health care provider. ?Document Revised: 12/30/2020 Document Reviewed: 12/30/2020 ?Elsevier Patient Education ? 2023 [employer] ? ?

## 2021-09-09 NOTE — H&P (View-Only) (Signed)
   09/09/2021 CC:  Chief Complaint  Patient presents with   cysto/trus    HPI: Isaac Cross is a 61 y.o. male with a personal history of BPH with LUTS and microscopic hematuria, who presents today for diagnostic cystoscopy.   He was noted to have a small medium lobe consistent with outlet obstruction back in 2017. He has had multiple imaging that was reviewed today and shows thickened bladder wall.    He was seen by his PCP, Dr Ancil Boozer, on 08/18/2021, he was noted to have LUTS that started after hospitalization and ICU stay back in 2020.   His most recent PSA is 1.32 on 08/18/2021. He has a previous PSA of 0.7 4 years ago.   Vitals:   09/09/21 1321  BP: 136/83  Pulse: (!) 105   NED. A&Ox3.   No respiratory distress   Abd soft, NT, ND Normal phallus with bilateral descended testicles  Cystoscopy Procedure Note  Patient identification was confirmed, informed consent was obtained, and patient was prepped using Betadine solution.  Lidocaine jelly was administered per urethral meatus.     Pre-Procedure: - Inspection reveals a normal caliber ureteral meatus.  Procedure: The flexible cystoscope was introduced without difficulty - No urethral strictures/lesions are present. - Normal prostate  - Slightly Elevated bladder neck - Bilateral ureteral orifices identified - Bladder mucosa  reveals no ulcers, tumors, or lesions - No bladder stones - Mild trabeculation  Retroflexion shows very small median lobe   Post-Procedure: - Patient tolerated the procedure well   Prostate transrectal ultrasound sizing   Informed consent was obtained after discussing risks/benefits of the procedure.  A time out was performed to ensure correct patient identity.   Pre-Procedure: -Transrectal probe was placed without difficulty -Transrectal Ultrasound performed revealing a 42.9 gm prostate measuring 3.48 x 4.85 x 4.87 cm (length) -No significant hypoechoic or median lobe noted   Assessment/  Plan:  Microscopic hematuria  - No obvious underlying etiology  - will continue to monitor or escalate imaging if this continue or worsens  2. BPH with LUTS  - Small median lobe on retroflexion today  - Discussed outlet procedure in detail such as UroLift, TURP, or HoLEP. May benefit from TURP or urolift using an advanced technique to address the median lobe. He is most interested in TURP.  - Risk including risk of bleeding, infection, demonstrating structures, need for Foley catheterization, possible overnight admission, and retrograde ejaculation were all discussed.   Schedule TURP  Conley Rolls as a scribe for [name], MD.,have documented all relevant documentation on the behalf of [name], MD,as directed by  [name], MD while in the presence of [name], MD.  I have reviewed the above documentation for accuracy and completeness, and I agree with the above.   [name], MD

## 2021-09-09 NOTE — Progress Notes (Signed)
   09/09/2021 CC: No chief complaint on file.   HPI: Isaac Cross is a 61 y.o. male with a personal history of BPH with LUTS ad microscopic hematuria, who presents today for diagnostic cystoscopy.   He was noted to have a small medium lobe consistent with outlet obstruction back in 2017. He has had multiple imaging that was reviewed today and shows thickened bladder wall.    He was seen by his PCP, Dr Ancil Boozer, on 08/18/2021, he was noted to have LUTS that started after hospitalization and ICU stay back in 2020.   His most recent PSA is 1.32 on 08/18/2021. He has a previous PSA of 0.7 4 years ago.  He reports that he is not currently seeing nephrology.   There were no vitals filed for this visit. NED. A&Ox3.   No respiratory distress   Abd soft, NT, ND Normal phallus with bilateral descended testicles  Cystoscopy Procedure Note  Patient identification was confirmed, informed consent was obtained, and patient was prepped using Betadine solution.  Lidocaine jelly was administered per urethral meatus.     Pre-Procedure: - Inspection reveals a normal caliber ureteral meatus.  Procedure: The flexible cystoscope was introduced without difficulty - No urethral strictures/lesions are present. - Normal prostate  - Slightly Elevated bladder neck - Bilateral ureteral orifices identified - Bladder mucosa  reveals no ulcers, tumors, or lesions - No bladder stones - Mild trabeculation  Retroflexion shows very small median lobe   Post-Procedure: - Patient tolerated the procedure well   Prostate transrectal ultrasound sizing   Informed consent was obtained after discussing risks/benefits of the procedure.  A time out was performed to ensure correct patient identity.   Pre-Procedure: -Transrectal probe was placed without difficulty -Transrectal Ultrasound performed revealing a 42.9 gm prostate measuring 3.48 x 4.85 x 4.87 cm (length) -No significant hypoechoic or median lobe noted    Assessment/ Plan: Microscopic hematuria  - No obvious underlying etiology  - will continue to monitor or escalate imaging if this continue or worsens  2. BPH with LUTS  - Small median lobe on retroflexion today  - Discussed outlet procedure in detail such as UroLift, TURP, or HoLEP. May benefit from TURP or urolift using an advanced technique to address the median lobe. He is most interested in TURP.    No follow-ups on file.  I,[name],acting as a Education administrator for [name], MD.,have documented all relevant documentation on the behalf of [name], MD,as directed by  [name], MD while in the presence of [name], MD.

## 2021-09-10 ENCOUNTER — Other Ambulatory Visit: Payer: Self-pay | Admitting: Urology

## 2021-09-10 DIAGNOSIS — N401 Enlarged prostate with lower urinary tract symptoms: Secondary | ICD-10-CM

## 2021-09-10 LAB — URINALYSIS, COMPLETE
Bilirubin, UA: NEGATIVE
Glucose, UA: NEGATIVE
Leukocytes,UA: NEGATIVE
Nitrite, UA: NEGATIVE
Specific Gravity, UA: >1.03 — ABNORMAL HIGH (ref 1.005–1.030)
Urobilinogen, Ur: 0.2 mg/dL (ref 0.2–1.0)
pH, UA: 5.5 (ref 5.0–7.5)

## 2021-09-10 LAB — MICROSCOPIC EXAMINATION

## 2021-09-10 NOTE — Progress Notes (Signed)
Surgical Physician Order Form [facility]  * Scheduling expectation : Next Available  *Length of Case:   *Clearance needed: yes; endocrinology Dr. Honor Junes for history of adrenal crisis (recommendations for periprocedural stress dose steroids)  *Anticoagulation Instructions: Hold all anticoagulants  *Aspirin Instructions: Hold Aspirin  *Post-op visit Date/Instructions:  1-3 day cath removal  *Diagnosis: BPH w/urinary obstruction  *Procedure:  TURP     Additional orders: N/A  -Admit type: OUTpatient  -Anesthesia: General  -VTE Prophylaxis Standing Order SCD's       Other:   -Standing Lab Orders Per Anesthesia    Lab other: None  -Standing Test orders EKG/Chest x-ray per Anesthesia       Test other:   - Medications:  Ancef 2gm IV  -Other orders:  N/A

## 2021-09-13 LAB — CULTURE, URINE COMPREHENSIVE

## 2021-09-15 ENCOUNTER — Telehealth: Payer: Self-pay

## 2021-09-15 ENCOUNTER — Telehealth: Payer: Self-pay | Admitting: Urgent Care

## 2021-09-15 NOTE — Telephone Encounter (Signed)
I spoke with Mr. Leisinger. We have discussed possible surgery dates and Monday June 12th, 2023 was agreed upon by all parties. Patient given information about surgery date, what to expect pre-operatively and post operatively.  We discussed that a Pre-Admission Testing office will be calling to set up the pre-op visit that will take place prior to surgery, and that these appointments are typically done over the phone with a Pre-Admissions RN.  Informed patient that our office will communicate any additional care to be provided after surgery. Patients questions or concerns were discussed during our call. Advised to call our office should there be any additional information, questions or concerns that arise. Patient verbalized understanding.

## 2021-09-15 NOTE — Progress Notes (Signed)
[facility] Posting Form   Surgery Date/Time: Date: 09/28/2021  Surgeon: Dr. [name], MD  Surgery Location: Day Surgery  Inpt ( No  )   Outpt (Yes)   Obs ( No  )   Diagnosis: N13.8,N40.1 Benign Prostatic Hyperplasia with Urinary Obstruction  -CPT: 24097  Surgery: Transurethral Resection of the Prostate  Stop Anticoagulations: Yes and hold ASA  Cardiac/Medical/Pulmonary Clearance needed: yes  Clearance needed from Dr: Honor Junes  Clearance request sent on: Date: 09/15/21 by Honor Loh, NP with Perioperative services  *Orders entered into EPIC  Date: 09/15/21   *Case booked in EPIC  Date: 09/15/21  *Notified pt of Surgery: Date: 09/15/21  PRE-OP UA & CX: no  *Placed into Prior Authorization Work [place] Date: 09/15/21   Assistant/laser/rep:No

## 2021-09-15 NOTE — Progress Notes (Signed)
  Perioperative Services Pre-Admission/Anesthesia Testing     Date: 09/15/21  Name: Isaac Cross MRN:   [id number]  Re: Request from surgery for clearance prior to scheduled procedure  Patient is scheduled to undergo a TRANSURETHRAL RESECTION OF THE PROSTATE (TURP) on 09/28/2021 with Dr. [name], MD. Patient has not been scheduled for his PAT appointment at this point, thus has not undergone review by PAT RN and/or APP. Received communication from primary attending surgeon's office requesting that patient be submitted for clearance from ENDOCRINOLOGY due to [place] (+) for adrenal crisis. Urologist asking specifically for recommendations on perioperative stress dose steroids.   PROVIDER SPECIALTY FAXED TO   Mee Hives, MD Endocrinology  [phone]   Plan:  Clearance documents generated and faxed to appropriate provider(s) as noted above. Note will be updated to reflect communication with provider's office as it relates to clearance being provided and/or the need for office visit prior to clearance for surgery being issued.   Honor Loh, MSN, APRN, FNP-C, CEN [facility]  Peri-operative Services Nurse Practitioner Phone: [phone] 09/15/21 2:57 PM  NOTE: This note has been prepared using Dragon dictation software. Despite my best ability to proofread, there is always the potential that unintentional transcriptional errors may still occur from this process.

## 2021-09-19 ENCOUNTER — Other Ambulatory Visit: Payer: Self-pay | Admitting: Family Medicine

## 2021-09-19 DIAGNOSIS — J449 Chronic obstructive pulmonary disease, unspecified: Secondary | ICD-10-CM

## 2021-09-19 DIAGNOSIS — E785 Hyperlipidemia, unspecified: Secondary | ICD-10-CM

## 2021-09-21 ENCOUNTER — Telehealth: Payer: Self-pay | Admitting: Urgent Care

## 2021-09-21 NOTE — Telephone Encounter (Signed)
Called and left vm to confirm Rosuvastatin and dosage taken.

## 2021-09-21 NOTE — Progress Notes (Signed)
  Perioperative Services Pre-Admission/Anesthesia Testing     Date: 09/21/21  Name: Isaac Cross MRN:   [id number]  Re: Surgical clearance  Surgical clearance received from Dr. Mee Hives  (endocrinology). Patient has been cleared for the planned TRANSURETHRAL RESECTION OF THE PROSTATE (TURP) procedure scheduled for 09/28/2021 with Dr. [name], MD.  Endocrinology notes that patient may proceed with an overall LOW risk stratification (from an adrenal standpoint).     Regarding stress dose steroids recommendations.  Per endocrinologist, "recommend 100 mg hydrocortisone IV on-call to the OR, then 50 mg every 8 hours after first dose for 3 doses, then 25 mg IV every 8 hours for 3 doses, then back on home oral dosing (20 mg hydrocortisone in the morning and 10 mg at night).  If patient doing well and ready to go home before the IV dosing finished, just change to double home oral dosing for 3 days at home and then return to normal dosing".  Copy of signed clearance form placed on patient's OR chart for review by the surgical/anesthetic team on the day of his procedure.   Honor Loh, MSN, APRN, FNP-C, CEN [facility]  Peri-operative Services Nurse Practitioner Phone: [phone] 09/21/21 8:23 AM

## 2021-09-23 ENCOUNTER — Other Ambulatory Visit: Payer: Self-pay

## 2021-09-23 ENCOUNTER — Encounter
Admission: RE | Admit: 2021-09-23 | Discharge: 2021-09-23 | Disposition: A | Discharge status: Home / Self Care | Payer: BC Managed Care – PPO | Source: Ambulatory Visit | Attending: Urology | Admitting: Urology

## 2021-09-23 DIAGNOSIS — I10 Essential (primary) hypertension: Secondary | ICD-10-CM

## 2021-09-23 DIAGNOSIS — Z01812 Encounter for preprocedural laboratory examination: Secondary | ICD-10-CM

## 2021-09-23 HISTORY — DX: Anemia, unspecified: D64.9

## 2021-09-23 NOTE — Patient Instructions (Addendum)
Your procedure is scheduled on: 09/28/21 - Monday Report to the Registration Desk on the 1st floor of the [place]. To find out your arrival time, please call [phone] between 1PM - 3PM on: 09/25/21 - Friday If your arrival time is 6:00 am, do not arrive prior to that time as the [facility] entrance doors do not open until 6:00 am.  REMEMBER: Instructions that are not followed completely may result in serious medical risk, up to and including death; or upon the discretion of your surgeon and anesthesiologist your surgery may need to be rescheduled.  Do not eat food or drink any fluids after midnight the night before surgery.  No gum chewing, lozengers or hard candies.  TAKE THESE MEDICATIONS THE MORNING OF SURGERY WITH A SIP OF WATER:  - citalopram (CELEXA) 20 MG tablet - fludrocortisone (FLORINEF) 0.1 MG tablet - take as directed - hydrocortisone (CORTEF) 20 MG tablet - take as directed - BREO ELLIPTA 100-25 MCG/ACT AEPB  Use albuterol (VENTOLIN HFA) 108 (90 Base) MCG/ACT inhaler on the day of surgery and bring to the hospital.  STOP taking metFORMIN (GLUCOPHAGE) 1000 MG tablet beginning 09/26/21, may resume the day after your procedure.  One week prior to surgery: Stop Anti-inflammatories (NSAIDS) such as Advil, Aleve, Ibuprofen, Motrin, Naproxen, Naprosyn and Aspirin based products such as Excedrin, Goodys Powder, BC Powder.  Stop ANY OVER THE COUNTER supplements until after surgery.Multiple Vitamins-Minerals , Omega-3 Fatty Acids   You may however, continue to take Tylenol if needed for pain up until the day of surgery.  No Alcohol for 24 hours before or after surgery.  No Smoking including e-cigarettes for 24 hours prior to surgery.  No chewable tobacco products for at least 6 hours prior to surgery.  No nicotine patches on the day of surgery.  Do not use any "recreational" drugs for at least a week prior to your surgery.  Please be advised that the combination of  cocaine and anesthesia may have negative outcomes, up to and including death. If you test positive for cocaine, your surgery will be cancelled.  On the morning of surgery brush your teeth with toothpaste and water, you may rinse your mouth with mouthwash if you wish. Do not swallow any toothpaste or mouthwash.  Do not wear jewelry, make-up, hairpins, clips or nail polish.  Do not wear lotions, powders, or perfumes.   Do not shave body from the neck down 48 hours prior to surgery just in case you cut yourself which could leave a site for infection.  Also, freshly shaved skin may become irritated if using the CHG soap.  Contact lenses, hearing aids and dentures may not be worn into surgery.  Do not bring valuables to the hospital. [facility] is not responsible for any missing/lost belongings or valuables.   Bring your C-PAP to the hospital with you in case you may have to spend the night.   Notify your doctor if there is any change in your medical condition (cold, fever, infection).  Wear comfortable clothing (specific to your surgery type) to the hospital.  After surgery, you can help prevent lung complications by doing breathing exercises.  Take deep breaths and cough every 1-2 hours. Your doctor may order a device called an Incentive Spirometer to help you take deep breaths. When coughing or sneezing, hold a pillow firmly against your incision with both hands. This is called "splinting." Doing this helps protect your incision. It also decreases belly discomfort.  If you are  being admitted to the hospital overnight, leave your suitcase in the car. After surgery it may be brought to your room.  If you are being discharged the day of surgery, you will not be allowed to drive home. You will need a responsible adult (18 years or older) to drive you home and stay with you that night.   If you are taking public transportation, you will need to have a responsible adult (18 years or older)  with you. Please confirm with your physician that it is acceptable to use public transportation.   Please call the [place] Dept. at [phone] if you have any questions about these instructions.  Surgery Visitation Policy:  Patients undergoing a surgery or procedure may have two family members or support persons with them as long as the person is not COVID-19 positive or experiencing its symptoms.   Inpatient Visitation:    Visiting hours are 7 a.m. to 8 p.m. Up to four visitors are allowed at one time in a patient room, including children. The visitors may rotate out with other people during the day. One designated support person (adult) may remain overnight.

## 2021-09-24 ENCOUNTER — Encounter: Payer: Self-pay | Admitting: Urgent Care

## 2021-09-24 ENCOUNTER — Encounter
Admission: RE | Admit: 2021-09-24 | Discharge: 2021-09-24 | Disposition: A | Discharge status: Home / Self Care | Payer: BC Managed Care – PPO | Source: Ambulatory Visit | Attending: Urology | Admitting: Urology

## 2021-09-24 DIAGNOSIS — Z01810 Encounter for preprocedural cardiovascular examination: Secondary | ICD-10-CM | POA: Diagnosis present

## 2021-09-24 DIAGNOSIS — I10 Essential (primary) hypertension: Secondary | ICD-10-CM

## 2021-09-24 DIAGNOSIS — Z01812 Encounter for preprocedural laboratory examination: Secondary | ICD-10-CM

## 2021-09-28 ENCOUNTER — Ambulatory Visit: Payer: BC Managed Care – PPO | Admitting: Urgent Care

## 2021-09-28 ENCOUNTER — Encounter: Payer: Self-pay | Admitting: Urology

## 2021-09-28 ENCOUNTER — Ambulatory Visit
Admission: RE | Admit: 2021-09-28 | Discharge: 2021-09-28 | Disposition: A | Discharge status: Home / Self Care | Payer: BC Managed Care – PPO | Attending: Urology | Admitting: Urology

## 2021-09-28 ENCOUNTER — Other Ambulatory Visit: Payer: Self-pay

## 2021-09-28 ENCOUNTER — Encounter
Admission: RE | Disposition: A | Discharge status: Home / Self Care | Payer: Self-pay | Source: Home / Self Care | Attending: Urology

## 2021-09-28 DIAGNOSIS — N401 Enlarged prostate with lower urinary tract symptoms: Secondary | ICD-10-CM | POA: Insufficient documentation

## 2021-09-28 DIAGNOSIS — N138 Other obstructive and reflux uropathy: Secondary | ICD-10-CM

## 2021-09-28 DIAGNOSIS — Z01812 Encounter for preprocedural laboratory examination: Secondary | ICD-10-CM

## 2021-09-28 DIAGNOSIS — R3129 Other microscopic hematuria: Secondary | ICD-10-CM | POA: Insufficient documentation

## 2021-09-28 HISTORY — PX: TRANSURETHRAL RESECTION OF PROSTATE: SHX73

## 2021-09-28 LAB — GLUCOSE, CAPILLARY
Glucose-Capillary: 175 mg/dL — ABNORMAL HIGH (ref 70–99)
Glucose-Capillary: 184 mg/dL — ABNORMAL HIGH (ref 70–99)

## 2021-09-28 SURGERY — TURP (TRANSURETHRAL RESECTION OF PROSTATE)
Anesthesia: General | Site: Prostate

## 2021-09-28 MED ORDER — CEFAZOLIN SODIUM-DEXTROSE 2-4 GM/100ML-% IV SOLN
2.0000 g | INTRAVENOUS | Status: AC
  Administered 2021-09-28: 2 g via INTRAVENOUS
  Administered 2021-09-28: 1 g via INTRAVENOUS

## 2021-09-28 MED ORDER — PROPOFOL 10 MG/ML IV BOLUS
INTRAVENOUS | Status: DC | PRN
  Administered 2021-09-28: 200 mg via INTRAVENOUS
  Administered 2021-09-28: 50 mg via INTRAVENOUS

## 2021-09-28 MED ORDER — ACETAMINOPHEN 10 MG/ML IV SOLN
INTRAVENOUS | Status: DC | PRN
  Administered 2021-09-28: 1000 mg via INTRAVENOUS

## 2021-09-28 MED ORDER — FUROSEMIDE 10 MG/ML IJ SOLN
INTRAMUSCULAR | Status: DC | PRN
  Administered 2021-09-28: 10 mg via INTRAMUSCULAR

## 2021-09-28 MED ORDER — SUCCINYLCHOLINE CHLORIDE 200 MG/10ML IV SOSY
PREFILLED_SYRINGE | INTRAVENOUS | Status: DC | PRN
  Administered 2021-09-28: 140 mg via INTRAVENOUS

## 2021-09-28 MED ORDER — CHLORHEXIDINE GLUCONATE 0.12 % MT SOLN: 15.0000 mL | Freq: Once | OROMUCOSAL | Status: AC

## 2021-09-28 MED ORDER — ORAL CARE MOUTH RINSE: 15.0000 mL | Freq: Once | OROMUCOSAL | Status: AC

## 2021-09-28 MED ORDER — ONDANSETRON HCL 4 MG/2ML IJ SOLN: 4.0000 mg | Freq: Once | INTRAMUSCULAR | Status: DC | PRN

## 2021-09-28 MED ORDER — LIDOCAINE HCL (CARDIAC) PF 100 MG/5ML IV SOSY
PREFILLED_SYRINGE | INTRAVENOUS | Status: DC | PRN
  Administered 2021-09-28: 100 mg via INTRAVENOUS

## 2021-09-28 MED ORDER — LACTATED RINGERS IV SOLN: INTRAVENOUS | Status: DC

## 2021-09-28 MED ORDER — MIDAZOLAM HCL 2 MG/2ML IJ SOLN
INTRAMUSCULAR | Status: AC
Start: 2021-09-28 — End: ?
  Filled 2021-09-28: qty 2

## 2021-09-28 MED ORDER — ESMOLOL HCL 100 MG/10ML IV SOLN
INTRAVENOUS | Status: DC | PRN
  Administered 2021-09-28: 10 mg via INTRAVENOUS
  Administered 2021-09-28: 20 mg via INTRAVENOUS
  Administered 2021-09-28: 10 mg via INTRAVENOUS

## 2021-09-28 MED ORDER — GLYCOPYRROLATE 0.2 MG/ML IJ SOLN
INTRAMUSCULAR | Status: DC | PRN
  Administered 2021-09-28: .2 mg via INTRAVENOUS

## 2021-09-28 MED ORDER — SODIUM CHLORIDE 0.9 % IR SOLN
Status: DC | PRN
  Administered 2021-09-28: 6500 mL via INTRAVESICAL

## 2021-09-28 MED ORDER — CEFAZOLIN SODIUM-DEXTROSE 2-4 GM/100ML-% IV SOLN
INTRAVENOUS | Status: AC
  Filled 2021-09-28: qty 100

## 2021-09-28 MED ORDER — FENTANYL CITRATE (PF) 100 MCG/2ML IJ SOLN: 25.0000 ug | INTRAMUSCULAR | Status: DC | PRN

## 2021-09-28 MED ORDER — DEXAMETHASONE SODIUM PHOSPHATE 10 MG/ML IJ SOLN
INTRAMUSCULAR | Status: DC | PRN
  Administered 2021-09-28: 5 mg via INTRAVENOUS

## 2021-09-28 MED ORDER — SODIUM CHLORIDE 0.9 % IV SOLN: INTRAVENOUS | Status: DC

## 2021-09-28 MED ORDER — HYDROCODONE-ACETAMINOPHEN 5-325 MG PO TABS: 1.0000 | ORAL_TABLET | Freq: Four times a day (QID) | ORAL | 0 refills | Status: DC | PRN

## 2021-09-28 MED ORDER — ONDANSETRON HCL 4 MG/2ML IJ SOLN
INTRAMUSCULAR | Status: DC | PRN
  Administered 2021-09-28: 4 mg via INTRAVENOUS

## 2021-09-28 MED ORDER — SUGAMMADEX SODIUM 500 MG/5ML IV SOLN
INTRAVENOUS | Status: DC | PRN
  Administered 2021-09-28: 500 mg via INTRAVENOUS
  Administered 2021-09-28: 200 mg via INTRAVENOUS

## 2021-09-28 MED ORDER — FENTANYL CITRATE (PF) 100 MCG/2ML IJ SOLN
INTRAMUSCULAR | Status: DC | PRN
Start: 2021-09-28 — End: 2021-09-28
  Administered 2021-09-28 (×2): 50 ug via INTRAVENOUS

## 2021-09-28 MED ORDER — MIDAZOLAM HCL 2 MG/2ML IJ SOLN
INTRAMUSCULAR | Status: DC | PRN
  Administered 2021-09-28: 2 mg via INTRAVENOUS

## 2021-09-28 MED ORDER — CHLORHEXIDINE GLUCONATE 0.12 % MT SOLN
OROMUCOSAL | Status: AC
  Administered 2021-09-28: 15 mL via OROMUCOSAL
  Filled 2021-09-28: qty 15

## 2021-09-28 MED ORDER — ROCURONIUM BROMIDE 100 MG/10ML IV SOLN
INTRAVENOUS | Status: DC | PRN
  Administered 2021-09-28: 20 mg via INTRAVENOUS
  Administered 2021-09-28: 30 mg via INTRAVENOUS
  Administered 2021-09-28: 10 mg via INTRAVENOUS

## 2021-09-28 MED ORDER — FENTANYL CITRATE (PF) 100 MCG/2ML IJ SOLN
INTRAMUSCULAR | Status: AC
  Filled 2021-09-28: qty 2

## 2021-09-28 MED ORDER — OXYBUTYNIN CHLORIDE 5 MG PO TABS: 5.0000 mg | ORAL_TABLET | Freq: Three times a day (TID) | ORAL | 0 refills | Status: DC | PRN

## 2021-09-28 MED ORDER — ACETAMINOPHEN 10 MG/ML IV SOLN
INTRAVENOUS | Status: AC
  Filled 2021-09-28: qty 100

## 2021-09-28 MED ORDER — DEXMEDETOMIDINE HCL IN NACL 200 MCG/50ML IV SOLN
INTRAVENOUS | Status: DC | PRN
  Administered 2021-09-28: 12 ug via INTRAVENOUS

## 2021-09-28 MED ORDER — FAMOTIDINE 20 MG PO TABS: 20.0000 mg | ORAL_TABLET | Freq: Once | ORAL | Status: DC

## 2021-09-28 SURGICAL SUPPLY — 26 items
ADAPTER IRRIG TUBE 2 SPIKE SOL (ADAPTER) ×2 IMPLANT
ADPR TBG 2 SPK PMP STRL ASCP (ADAPTER)
BAG DRAIN CYSTO-URO LG1000N (MISCELLANEOUS) ×2 IMPLANT
BAG DRN LRG CPC RND TRDRP CNTR (MISCELLANEOUS) ×1
BAG URO DRAIN 4000ML (MISCELLANEOUS) ×2 IMPLANT
CATH FOL 2WAY LX 20X30 (CATHETERS) ×1 IMPLANT
CATH FOL 2WAY LX 24X30 (CATHETERS) IMPLANT
DRAPE UTILITY 15X26 TOWEL STRL (DRAPES) ×2 IMPLANT
ELECT LOOP 22F BIPOLAR SML (ELECTROSURGICAL)
ELECTRODE LOOP 22F BIPOLAR SML (ELECTROSURGICAL) IMPLANT
GAUZE 4X4 16PLY ~~LOC~~+RFID DBL (SPONGE) ×4 IMPLANT
GLOVE BIO SURGEON STRL SZ 6.5 (GLOVE) ×2 IMPLANT
GOWN STRL REUS W/ TWL LRG LVL3 (GOWN DISPOSABLE) ×2 IMPLANT
GOWN STRL REUS W/TWL LRG LVL3 (GOWN DISPOSABLE) ×4
HOLDER FOLEY CATH W/STRAP (MISCELLANEOUS) ×2 IMPLANT
IV NS IRRIG 3000ML ARTHROMATIC (IV SOLUTION) ×10 IMPLANT
KIT TURNOVER CYSTO (KITS) ×2 IMPLANT
LOOP CUT BIPOLAR 24F LRG (ELECTROSURGICAL) ×1 IMPLANT
MANIFOLD NEPTUNE II (INSTRUMENTS) ×2 IMPLANT
PACK CYSTO AR (MISCELLANEOUS) ×2 IMPLANT
SET IRRIG Y TYPE TUR BLADDER L (SET/KITS/TRAYS/PACK) ×2 IMPLANT
SURGILUBE 2OZ TUBE FLIPTOP (MISCELLANEOUS) ×2 IMPLANT
SYR TOOMEY IRRIG 70ML (MISCELLANEOUS) ×2
SYRINGE TOOMEY IRRIG 70ML (MISCELLANEOUS) ×1 IMPLANT
WATER STERILE IRR 1000ML POUR (IV SOLUTION) ×2 IMPLANT
WATER STERILE IRR 500ML POUR (IV SOLUTION) ×2 IMPLANT

## 2021-09-28 NOTE — Anesthesia Postprocedure Evaluation (Signed)
Anesthesia Post Note  Patient: Isaac Cross  Procedure(s) Performed: TRANSURETHRAL RESECTION OF THE PROSTATE (TURP) (Prostate)  Patient location during evaluation: PACU Anesthesia Type: General Level of consciousness: awake and awake and alert Pain management: satisfactory to patient Vital Signs Assessment: post-procedure vital signs reviewed and stable Respiratory status: spontaneous breathing and nonlabored ventilation Cardiovascular status: stable Anesthetic complications: no   No notable events documented.   Last Vitals:  Vitals:   09/28/21 0853 09/28/21 0900  BP: (!) 163/102 (!) 134/93  Pulse: 93 97  Resp: 12 14  Temp: 37.1 C   SpO2: 99% 95%    Last Pain:  Vitals:   09/28/21 0853  TempSrc:   PainSc: 0-No pain                 VAN STAVEREN,Nevelyn Mellott

## 2021-09-28 NOTE — Op Note (Signed)
Transurethral Resection of the Prostate Procedure Note  Indications: The patient has bladder outlet obstruction secondary to BPH and has elected to proceed with TURP as definitive management.   Pre-operative Diagnosis: BPH  Post-operative Diagnosis: same  Surgeon: [name]   Anesthesia: General endotracheal anesthesia  Drain: 20 Fr 2 way foley with 50 cc balloon  Procedure Details  The patient was seen in the holding room. The risks, benefits, complications, treatment options, and expected outcomes were discussed with the patient. The possibilities of reaction to medication, pulmonary aspiration, perforation of viscus, bleeding, recurrent infection, the need for additional procedures, failure to diagnose a condition, and creating a complication requiring transfusion or operation were discussed with the patient. The patient concurred with the proposed plan, giving informed consent.  The site of surgery properly noted/marked. The patient was taken to operating room # 10, identified as Isaac Cross and the procedure verified as Transurethral Resection of the Prostate. A Time Out was held and the above information confirmed.  After the induction of satisfactory anesthesia the patient was placed in the dorsal lithotomy position and prepped and draped in the usual sterile fashion. The 83 French continuous flow resectoscope was introduced into the bladder using a blunt angled obturator. The ureteral orifices were identified. The bipolar loop working element was used for the resection and saline was the irrigating fluid. The resection was begun with the median lobe tissue followed by the anterior and then lateral lobe tissue. The floor of the prostatic fossa and apical tissue were removed to complete the resection. Capsular fibers were identified and there were no capsular perforations. Hemostasis was achieved with coagulation current. The chips were recovered from the bladder using the [facility]  evacuator. The ureteral orifices were noted to be uninjured after the resection. A 20 French 2 way Foley catheter was inserted at the conclusion of the procedure .  Hemostasis was excellent.  He did receive 10 mg of IV Lasix.  Instrument, sponge, and needle counts were correct prior the abdominal closure and at the conclusion of the case.   Findings: Significant well-circumscribed median lobe with fairly short prostatic fossa.  Estimate Blood Loss:  Minimal               Specimens: Specimen:  Prostate chips                 Complications:  None; patient tolerated the procedure well.         Disposition: PACU - hemodynamically stable.         Condition: stable

## 2021-09-28 NOTE — Interval H&P Note (Signed)
History and Physical Interval Note:  09/28/2021 7:16 AM  Isaac Cross  has presented today for surgery, with the diagnosis of Benign Prostatic Hyperplasia with Urinary Obstruction.  The various methods of treatment have been discussed with the patient and family. After consideration of risks, benefits and other options for treatment, the patient has consented to  Procedure(s): TRANSURETHRAL RESECTION OF THE PROSTATE (TURP) (N/A) as a surgical intervention.  The patient's history has been reviewed, patient examined, no change in status, stable for surgery.  I have reviewed the patient's chart and labs.  Questions were answered to the patient's satisfaction.    RRR CTAB    [name]

## 2021-09-28 NOTE — Anesthesia Procedure Notes (Signed)
Procedure Name: Intubation Date/Time: 09/28/2021 8:09 AM  Performed by: Kelton Pillar, CRNAPre-anesthesia Checklist: Patient identified, Emergency Drugs available, Suction available and Patient being monitored Patient Re-evaluated:Patient Re-evaluated prior to induction Oxygen Delivery Method: Circle system utilized Preoxygenation: Pre-oxygenation with 100% oxygen Induction Type: IV induction Ventilation: Mask ventilation without difficulty Laryngoscope Size: McGraph and 4 Grade View: Grade I Tube type: Oral Tube size: 7.0 mm Number of attempts: 1 Airway Equipment and Method: Stylet and Oral airway Placement Confirmation: ETT inserted through vocal cords under direct vision, positive ETCO2, breath sounds checked- equal and bilateral and CO2 detector Secured at: 21 cm Tube secured with: Tape Dental Injury: Teeth and Oropharynx as per pre-operative assessment

## 2021-09-28 NOTE — Transfer of Care (Signed)
Immediate Anesthesia Transfer of Care Note  Patient: Isaac Cross  Procedure(s) Performed: TRANSURETHRAL RESECTION OF THE PROSTATE (TURP) (Prostate)  Patient Location: PACU  Anesthesia Type:General  Level of Consciousness: awake, drowsy and patient cooperative  Airway & Oxygen Therapy: Patient Spontanous Breathing and Patient connected to face mask oxygen  Post-op Assessment: Report given to RN and Post -op Vital signs reviewed and stable  Post vital signs: Reviewed and stable  Last Vitals:  Vitals Value Taken Time  BP 163/102 09/28/21 0853  Temp 37.1 C 09/28/21 0853  Pulse 97 09/28/21 0857  Resp 21 09/28/21 0857  SpO2 99 % 09/28/21 0857  Vitals shown include unvalidated device data.  Last Pain:  Vitals:   09/28/21 0853  TempSrc:   PainSc: 0-No pain         Complications: No notable events documented.

## 2021-09-28 NOTE — Anesthesia Preprocedure Evaluation (Signed)
Anesthesia Evaluation  Patient identified by MRN, date of birth, ID band Patient awake    Reviewed: Allergy & Precautions, NPO status , Patient's Chart, lab work & pertinent test results  Airway Mallampati: III  TM Distance: >3 FB Neck ROM: Full    Dental  (+) Dental Advisory Given,    Pulmonary neg pulmonary ROS, asthma , sleep apnea and Continuous Positive Airway Pressure Ventilation , COPD,  COPD inhaler, Patient did not abstain from smoking., former smoker,    Pulmonary exam normal  + decreased breath sounds      Cardiovascular Exercise Tolerance: Good hypertension, Pt. on medications + CAD  negative cardio ROS Normal cardiovascular exam Rhythm:Regular Rate:Normal     Neuro/Psych Anxiety Depression negative neurological ROS  negative psych ROS   GI/Hepatic negative GI ROS, Neg liver ROS, GERD  Medicated,  Endo/Other  negative endocrine ROSdiabetes, Well Controlled, Type 2, Oral Hypoglycemic Agents  Renal/GU ARFRenal disease  negative genitourinary   Musculoskeletal  (+) Arthritis ,   Abdominal Normal abdominal exam  (+)   Peds negative pediatric ROS (+)  Hematology negative hematology ROS (+) Blood dyscrasia, anemia ,   Anesthesia Other Findings Past Medical History: No date: Allergy No date: Anemia No date: Anxiety No date: Arthritis     Comment:  knees No date: Asthma, mild intermittent, well-controlled No date: Chronic obstructive asthma (HCC) No date: COPD (chronic obstructive pulmonary disease) (HCC) No date: Depression with anxiety No date: Diabetes (HCC) No date: GERD (gastroesophageal reflux disease) No date: HBP (high blood pressure) No date: High cholesterol No date: Renal cyst, right No date: Sleep apnea     Comment:  CPAP No date: Swelling  Past Surgical History: No date: BLADDER REPAIR 04/07/2015: COLONOSCOPY WITH PROPOFOL; N/A     Comment:  Procedure: COLONOSCOPY WITH PROPOFOL;   Surgeon: Lucilla Lame, MD;  Location: [place];  Service:               Endoscopy;  Laterality: N/A;  Diabetic - oral meds CPAP No date: HAND SURGERY; Left     Comment:  4TH AND 5TH FINGER 09/18/2018: I & D KNEE WITH POLY EXCHANGE; Right     Comment:  Procedure: IRRIGATION AND DEBRIDEMENT KNEE WITH POLY               EXCHANGE;  Surgeon: Lovell Sheehan, MD;  Location: [facility]               ORS;  Service: Orthopedics;  Laterality: Right; No date: KNEE ARTHROSCOPY; Right 09/01/2018: REPLACEMENT TOTAL KNEE BILATERAL; Bilateral 09/17/2018: TOTAL KNEE ARTHROPLASTY; Left     Comment:  Procedure: I &D left knee and polyethylene exchange;                Surgeon: Lovell Sheehan, MD;  Location: [facility] ORS;                Service: Orthopedics;  Laterality: Left; No date: VEIN SURGERY; Right     Reproductive/Obstetrics negative OB ROS                             Anesthesia Physical Anesthesia Plan  ASA: 3  Anesthesia Plan: General   Post-op Pain Management:    Induction: Intravenous  PONV Risk Score and Plan: Ondansetron, Dexamethasone, Midazolam and Treatment may vary due to age or medical condition  Airway  Management Planned: Oral ETT  Additional Equipment:   Intra-op Plan:   Post-operative Plan: Extubation in OR  Informed Consent: I have reviewed the patients History and Physical, chart, labs and discussed the procedure including the risks, benefits and alternatives for the proposed anesthesia with the patient or authorized representative who has indicated his/her understanding and acceptance.     Dental Advisory Given  Plan Discussed with: CRNA and Surgeon  Anesthesia Plan Comments:         Anesthesia Quick Evaluation

## 2021-09-28 NOTE — Discharge Instructions (Signed)

## 2021-09-29 ENCOUNTER — Encounter: Payer: Self-pay | Admitting: Urology

## 2021-09-29 ENCOUNTER — Telehealth: Payer: Self-pay | Admitting: *Deleted

## 2021-09-29 LAB — SURGICAL PATHOLOGY

## 2021-09-29 NOTE — Telephone Encounter (Addendum)
Left patient VM with details asked to return with any questions.   ----- Message from [name], MD sent at 09/29/2021 11:45 AM EDT ----- Juluis Rainier- prostate chips from TURP benign.  [name], MD

## 2021-09-30 ENCOUNTER — Ambulatory Visit (INDEPENDENT_AMBULATORY_CARE_PROVIDER_SITE_OTHER): Payer: BC Managed Care – PPO | Admitting: Physician Assistant

## 2021-09-30 DIAGNOSIS — N401 Enlarged prostate with lower urinary tract symptoms: Secondary | ICD-10-CM

## 2021-09-30 DIAGNOSIS — R350 Frequency of micturition: Secondary | ICD-10-CM

## 2021-09-30 NOTE — Progress Notes (Signed)
Catheter Removal  Patient is present today for a catheter removal.  64m of water was drained from the balloon. A 20FR foley cath was removed from the bladder no complications were noted . Patient tolerated well.  Performed by: SDebroah Loop PA-C   Follow up/ Additional notes: Return in about 6 weeks (around 11/11/2021) for Postop f/u with Dr. BErlene Quanwith IPSS, PVR.

## 2021-10-13 LAB — HM DIABETES EYE EXAM

## 2021-10-31 ENCOUNTER — Other Ambulatory Visit: Payer: Self-pay | Admitting: Nurse Practitioner

## 2021-10-31 DIAGNOSIS — E785 Hyperlipidemia, unspecified: Secondary | ICD-10-CM

## 2021-11-01 ENCOUNTER — Encounter: Payer: Self-pay | Admitting: Family Medicine

## 2021-11-02 ENCOUNTER — Other Ambulatory Visit: Payer: Self-pay | Admitting: Family Medicine

## 2021-11-02 DIAGNOSIS — J449 Chronic obstructive pulmonary disease, unspecified: Secondary | ICD-10-CM

## 2021-11-02 MED ORDER — ALBUTEROL SULFATE HFA 108 (90 BASE) MCG/ACT IN AERS: 2.0000 | INHALATION_SPRAY | Freq: Four times a day (QID) | RESPIRATORY_TRACT | 0 refills | Status: DC | PRN

## 2021-11-02 NOTE — Telephone Encounter (Signed)
Requested Prescriptions  Pending Prescriptions Disp Refills  . rosuvastatin (CRESTOR) 20 MG tablet [Pharmacy Med Name: ROSUVASTATIN CALCIUM 20 MG TAB] 90 tablet 0    Sig: TAKE 1 TABLET BY MOUTH EVERY DAY     Cardiovascular:  Antilipid - Statins 2 Failed - 10/31/2021  7:42 AM      Failed - Lipid Panel in normal range within the last 12 months    Cholesterol, Total  Date Value Ref Range Status  06/17/2015 127 100 - 199 mg/dL Final   Cholesterol  Date Value Ref Range Status  08/18/2021 161 <200 mg/dL Final   LDL Cholesterol (Calc)  Date Value Ref Range Status  08/18/2021 76 mg/dL (calc) Final    Comment:    Reference range: <100 . Desirable range <100 mg/dL for primary prevention;   <70 mg/dL for patients with CHD or diabetic patients  with > or = 2 CHD risk factors. Marland Kitchen LDL-C is now calculated using the Martin-Hopkins  calculation, which is a validated novel method providing  better accuracy than the Friedewald equation in the  estimation of LDL-C.  Cresenciano Genre et al. Annamaria Helling. 6387;564(33): 2061-2068  (http://education.[employer].com/faq/FAQ164)    HDL  Date Value Ref Range Status  08/18/2021 60 > OR = 40 mg/dL Final  06/17/2015 40 >39 mg/dL Final   Triglycerides  Date Value Ref Range Status  08/18/2021 146 <150 mg/dL Final         Passed - Cr in normal range and within 360 days    Creat  Date Value Ref Range Status  08/18/2021 1.08 0.70 - 1.35 mg/dL Final   Creatinine, POC  Date Value Ref Range Status  04/08/2017 neg mg/dL Corrected   Creatinine, Urine  Date Value Ref Range Status  08/18/2021 212 20 - 320 mg/dL Final         Passed - Patient is not pregnant      Passed - Valid encounter within last 12 months    Recent Outpatient Visits          2 months ago Type 2 diabetes mellitus with other diabetic kidney complication [facility])   [facility] Steele Sizer, MD   6 months ago Adrenal insufficiency [facility])   [facility] Steele Sizer, MD   7 months ago URI, acute   [facility], Drue Stager, MD   11 months ago Diabetes mellitus type 2 in obese [facility])   [facility] Steele Sizer, MD   1 year ago Diabetes mellitus type 2 in obese [facility])   [facility] Steele Sizer, MD      Future Appointments            In 1 month Ancil Boozer, Drue Stager, MD [facility], [facility]

## 2021-11-18 ENCOUNTER — Ambulatory Visit (INDEPENDENT_AMBULATORY_CARE_PROVIDER_SITE_OTHER): Payer: BC Managed Care – PPO | Admitting: Urology

## 2021-11-18 ENCOUNTER — Encounter: Payer: Self-pay | Admitting: Urology

## 2021-11-18 VITALS — BP 128/78 | HR 109 | Ht 77.0 in | Wt 331.0 lb

## 2021-11-18 DIAGNOSIS — R350 Frequency of micturition: Secondary | ICD-10-CM | POA: Diagnosis not present

## 2021-11-18 DIAGNOSIS — N401 Enlarged prostate with lower urinary tract symptoms: Secondary | ICD-10-CM | POA: Diagnosis not present

## 2021-11-18 LAB — BLADDER SCAN AMB NON-IMAGING: Scan Result: 12

## 2021-11-18 NOTE — Progress Notes (Signed)
11/18/21 2:13 PM   Isaac Cross 26-Mar-1961 [id number]  Referring provider:  Steele Sizer, MD [address] [place],  [place] [address] Chief Complaint  Patient presents with   Routine Post Op     HPI: Isaac Cross is a 61 y.o.male with a personal history of BPH with Luts and microscopic hematuria, Who presents today with a 6 week follow up.  In 2017 he was noted to have a small, median lobe consistent with outlet obstruction. He had multiple imaging that was reviewed and showed a thickened bladder wall.  Cystoscopy on 09/09/2021 revealed a very small medium lobe. TRUS revealed a 42.9 gm prostate measuring 3.48 x 4.85 x 4.87 gm.   He elected to undergo a TURP On 09/28/2021. Surgical pathology was negative for malignancy.  His most recent PSA was on 08/18/2021 and was 1.32.  He reports that he has some urinary urgency. His stream, emptying, and nocturia have improved.  Overall, he is quite pleased.  IPSS 12 and PVR 12 ml today.    IPSS     Row Name 11/18/21 0900         International Prostate Symptom Score   How often have you had the sensation of not emptying your bladder? Not at All     How often have you had to urinate less than every two hours? More than half the time     How often have you found you stopped and started again several times when you urinated? Not at All     How often have you found it difficult to postpone urination? More than half the time     How often have you had a weak urinary stream? Not at All     How often have you had to strain to start urination? Not at All     How many times did you typically get up at night to urinate? 4 Times     Total IPSS Score 12       Quality of Life due to urinary symptoms   If you were to spend the rest of your life with your urinary condition just the way it is now how would you feel about that? Mixed              Score:  1-7 Mild 8-19 Moderate 20-35 Severe     PMH: Past Medical  History:  Diagnosis Date   Allergy    Anemia    Anxiety    Arthritis    knees   Asthma, mild intermittent, well-controlled    Chronic obstructive asthma (HCC)    COPD (chronic obstructive pulmonary disease) (HCC)    Depression with anxiety    Diabetes (HCC)    GERD (gastroesophageal reflux disease)    HBP (high blood pressure)    High cholesterol    Renal cyst, right    Sleep apnea    CPAP   Swelling     Surgical History: Past Surgical History:  Procedure Laterality Date   BLADDER REPAIR     COLONOSCOPY WITH PROPOFOL N/A 04/07/2015   Procedure: COLONOSCOPY WITH PROPOFOL;  Surgeon: Lucilla Lame, MD;  Location: [place];  Service: Endoscopy;  Laterality: N/A;  Diabetic - oral meds CPAP   HAND SURGERY Left    4TH AND 5TH FINGER   I & D KNEE WITH POLY EXCHANGE Right 09/18/2018   Procedure: IRRIGATION AND DEBRIDEMENT KNEE WITH POLY EXCHANGE;  Surgeon: Lovell Sheehan, MD;  Location: [facility] ORS;  Service: Orthopedics;  Laterality: Right;   KNEE ARTHROSCOPY Right    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   TOTAL KNEE ARTHROPLASTY Left 09/17/2018   Procedure: I &D left knee and polyethylene exchange;  Surgeon: Lovell Sheehan, MD;  Location: [facility] ORS;  Service: Orthopedics;  Laterality: Left;   TRANSURETHRAL RESECTION OF PROSTATE N/A 09/28/2021   Procedure: TRANSURETHRAL RESECTION OF THE PROSTATE (TURP);  Surgeon: [name], MD;  Location: [facility] ORS;  Service: Urology;  Laterality: N/A;   VEIN SURGERY Right     Home Medications:  Allergies as of 11/18/2021       Reactions   Heparin    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 If you have any questions, ask your nurse or doctor.          acetaminophen 500 MG tablet Commonly known as: TYLENOL Take 1 tablet (500  mg total) by mouth every 6 (six) hours as needed.   acetic acid-hydrocortisone OTIC solution Commonly known as: VOSOL-HC Place 3 drops into both ears 2 (two) times daily. What changed:  when to take this reasons to take this   acidophilus Caps capsule Take 1 capsule by mouth daily.   albuterol 108 (90 Base) MCG/ACT inhaler Commonly known as: VENTOLIN HFA Inhale 2 puffs into the lungs every 6 (six) hours as needed.   Azelastine-Fluticasone 137-50 MCG/ACT Susp Place 1 spray into the nose every 12 (twelve) hours. What changed:  when to take this reasons to take this   Breo Ellipta 100-25 MCG/ACT Aepb Generic drug: fluticasone furoate-vilanterol TAKE 1 PUFF BY MOUTH EVERY DAY   citalopram 20 MG tablet Commonly known as: CELEXA TAKE 1 TABLET BY MOUTH EVERY DAY   diclofenac Sodium 1 % Gel Commonly known as: Voltaren Apply 4 g topically 4 (four) times daily. What changed:  when to take this reasons to take this   famotidine 20 MG tablet Commonly known as: PEPCID Take 20 mg by mouth 2 (two) times daily.   Fish Oil 1000 MG Caps Take by mouth daily.   fludrocortisone 0.1 MG tablet Commonly known as: FLORINEF Take 1 tablet (0.1 mg total) by mouth daily.   HYDROcodone-acetaminophen 5-325 MG tablet Commonly known as: NORCO/VICODIN Take 1-2 tablets by mouth every 6 (six) hours as needed for moderate pain.   hydrocortisone 20 MG tablet Commonly known as: CORTEF Take 0.5-1 tablets (10-20 mg total) by mouth 2 (two) times daily. 1 in am and half at night   levocetirizine 5 MG tablet Commonly known as: XYZAL TAKE 1 TABLET BY MOUTH EVERY DAY IN THE EVENING   lisinopril 20 MG tablet Commonly known as: ZESTRIL TAKE 1 TABLET BY MOUTH EVERY DAY   Mens Multivitamin Plus Tabs Take by mouth.   metFORMIN 1000 MG tablet Commonly known as: GLUCOPHAGE Take 1 tablet (1,000 mg total) by mouth 2 (two) times daily with a meal.   oxybutynin 5 MG tablet Commonly known as:  DITROPAN Take 1 tablet (5 mg total) by mouth every 8 (eight) hours as needed for bladder spasms.   rosuvastatin 20 MG tablet Commonly known as: CRESTOR TAKE 1 TABLET BY MOUTH EVERY DAY   Senna-Plus 8.6-50 MG tablet Generic drug: senna-docusate Take 1  tablet by mouth every morning.        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 His smoking use included cigarettes. He started smoking about 26 years ago. He has a 10.00 pack-year smoking history. He has never used smokeless tobacco. He reports current alcohol use of about 10.0 standard drinks of alcohol per week. He reports that he does not use drugs.   Physical Exam: BP 128/78   Pulse (!) 109   Ht '6\' 5"'$  (1.956 m)   Wt (!) 331 lb (150.1 kg)   BMI 39.25 kg/m   Constitutional:  Alert and oriented, No acute distress. HEENT: [place] AT, moist mucus membranes.  Trachea midline, no masses. Cardiovascular: No clubbing, cyanosis, or edema. Respiratory: Normal respiratory effort, no increased work of breathing. Skin: No rashes, bruises or suspicious lesions. Neurologic: Grossly intact, no focal deficits, moving all 4 extremities. Psychiatric: Normal mood and affect.  Laboratory Data:  Lab Results  Component Value Date   CREATININE 1.08 08/18/2021   Lab Results  Component Value Date   HGBA1C 6.6 (H) 08/18/2021      Pertinent Imaging: Results for orders placed or performed in visit on 11/18/21  Bladder Scan (Post Void Residual) in office  Result Value Ref Range   Scan Result 12     Assessment & Plan:   BPH with LUTS - S/p TURP  >- He is emptying adequately with PVR of 12 ml/ - Improvement in symptoms he has some urgency --> discussed is likely improve but he was also offered a beta 3 agonist for the short-term and declined -Recheck urinary symptoms in 6 months, if stable or improved, will likely discharge -Recheck PSA in 6 months for new baseline - PSA; future  2. Urge Incontinence  - Discussed common post-operative course. Expect this to resolve with time; see above  6 months with PSA/IPSS/PVR  Conley Rolls as a scribe for [name], MD.,have documented all relevant documentation on the behalf of [name], MD,as directed by  [name], MD while in the presence of [name], MD.  I have reviewed the above documentation for accuracy and completeness, and I agree with the above.   [name], MD  [facility] [address] [place], [place] [address] [phone]

## 2021-11-26 ENCOUNTER — Other Ambulatory Visit: Payer: Self-pay | Admitting: Family Medicine

## 2021-11-26 DIAGNOSIS — J449 Chronic obstructive pulmonary disease, unspecified: Secondary | ICD-10-CM

## 2021-12-18 NOTE — Progress Notes (Unsigned)
Name: Isaac Cross   MRN: [id number]    DOB: [birth year]   Date:12/22/2021       Progress Note  Subjective  Chief Complaint  Follow Up  HPI  S/P bilateral total knee Replacements:  It was done by  Dr. Harlow Mares on May 15th, 2020 in [place]. He has been going for walks, denies any pain , doing well.    COPD/Asthma:usually moderate but currently under control with medication,, he is still using medication every other day and denies any current wheezing, cough or SOB  AR: he is taking Xyzal and using nasal spray prn,  symptoms are stable.   OSA: he wears it every night CPAP machine, he denies waking up with headaches, he has been feeling rested when he gets up. Unchanged    DMII:   A1C went from 6.4 % to 6.9 %  today is up to 7.4 % . He is taking Metformin 1000 mg BID, discussed GLP-1 agonist and SGL-2 agonist . He denies polyphagia, polydipsia or polyuria . Urine micro was up   He has associated dyslipidemia, obesity , HTN and microalbuminuria. We will try switching to [facility] Xxx , discussed possible side effects    HTN: he is taking lisinopril 40 mg and bp is at goal , no dizziness, chest pain or palpitation .    Atherosclerosis abdominal aorta: taking statin therapy, last LDL was 76 and he is now on higher dose of Rosuvastatin at 20 mg and tolerating it well.    Adrenal insufficiency/Adrenal mass bilaterally : size of adrenal glands improved still taking medication given by Dr. Honor Junes , it happened while hospitalized and masses likely from subdural hematoma and he is doing well on medications. He is on yearly follow ups.     Depression/anxiety: doing well on medication. Phq 9 is negative. Retired, he was volunteering for a period of time, he starting a new part time today. 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 It happened 2 days ago   Patient Active Problem List   Diagnosis Date Noted   History of total knee arthroplasty 09/04/2018   Coronary atherosclerosis due to calcified coronary lesion 12/02/2017   Atherosclerosis of abdominal aorta ([place]) 06/17/2015   Fatty liver disease, nonalcoholic 40/98/1191   Renal cyst, right 06/17/2015   BPH (benign prostatic hyperplasia) 05/25/2015   Microscopic hematuria 05/25/2015   Second degree hemorrhoids    Diverticulosis of large intestine without diverticulitis    Asthma, mild intermittent, well-controlled 10/16/2014   Allergic rhinitis 10/13/2014   ED (erectile dysfunction) of organic origin 07/22/2014   Gastro-esophageal reflux disease without esophagitis 07/22/2014   Benign hypertension 07/22/2014   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[place]) 07/22/2014   Adult BMI 30+ 07/22/2014   Chronic obstructive asthma ([place]) 07/22/2014   Depression with anxiety 07/22/2014   Dyslipidemia 07/22/2014   Genital herpes 07/22/2014   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   Osteoarthritis of both knees 07/22/2014   Obstructive apnea 05/14/2013   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   Blood in the urine 09/20/2006    Past Surgical History:  Procedure Laterality Date   BLADDER REPAIR     COLONOSCOPY WITH PROPOFOL N/A 04/07/2015  Procedure: COLONOSCOPY WITH PROPOFOL;  Surgeon: Lucilla Lame, MD;  Location: [facility];  Service: Endoscopy;  Laterality: N/A;  Diabetic - oral meds CPAP   HAND SURGERY Left    4TH AND 5TH FINGER   I & D KNEE WITH POLY EXCHANGE Right 09/18/2018   Procedure: IRRIGATION AND DEBRIDEMENT KNEE WITH POLY EXCHANGE;  Surgeon: Lovell Sheehan, MD;  Location: [facility];  Service: Orthopedics;  Laterality: Right;   KNEE ARTHROSCOPY Right    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   TOTAL KNEE ARTHROPLASTY Left  09/17/2018   Procedure: I &D left knee and polyethylene exchange;  Surgeon: Lovell Sheehan, MD;  Location: [facility];  Service: Orthopedics;  Laterality: Left;   TRANSURETHRAL RESECTION OF PROSTATE N/A 09/28/2021   Procedure: TRANSURETHRAL RESECTION OF THE PROSTATE (TURP);  Surgeon: [name], MD;  Location: [facility];  Service: Urology;  Laterality: N/A;   TRANSURETHRAL RESECTION OF PROSTATE  09/28/2021   VEIN SURGERY Right     Family History  Problem Relation Age of Onset   Hypertension Mother    Diabetes Mother    Diabetes Father    Hypertension Father    Cancer Father    Seizures Daughter    Lupus Daughter    Cancer Maternal Grandfather        Prostate   Diabetes Paternal Grandfather    Prostate cancer Maternal Uncle    Bladder Cancer Neg Hx    Kidney cancer Neg Hx     Social History   Tobacco Use   Smoking status: Former    Packs/day: 1.00    Years: 10.00    Total pack years: 10.00    Types: Cigarettes    Start date: 04/20/1995    Quit date: 04/19/2005    Years since quitting: 16.6   Smokeless tobacco: Never  Substance Use Topics   Alcohol use: Yes    Alcohol/week: 10.0 standard drinks of alcohol    Types: 10 Cans of beer per week    Comment: occasional     Current Outpatient Medications:    acetaminophen (TYLENOL) 500 MG tablet, Take 1 tablet (500 mg total) by mouth every 6 (six) hours as needed., Disp: 90 tablet, Rfl: 0   acetic acid-hydrocortisone (VOSOL-HC) OTIC solution, Place 3 drops into both ears 2 (two) times daily. (Patient taking differently: Place 3 drops into both ears as needed.), Disp: 10 mL, Rfl: 0   acidophilus (RISAQUAD) CAPS capsule, Take 1 capsule by mouth daily., Disp: 30 capsule, Rfl: 0   albuterol (VENTOLIN HFA) 108 (90 Base) MCG/ACT inhaler, TAKE 2 PUFFS BY MOUTH EVERY 6 HOURS AS NEEDED, Disp: 18 each, Rfl: 0   BREO ELLIPTA 100-25 MCG/ACT AEPB, TAKE 1 PUFF BY MOUTH EVERY DAY, Disp: 180 each, Rfl: 1   Dapagliflozin-metFORMIN HCl ER  (XIGDUO XR) 01-999 MG TB24, Take 1 tablet by mouth daily., Disp: 90 tablet, Rfl: 1   diclofenac Sodium (VOLTAREN) 1 % GEL, Apply 4 g topically 4 (four) times daily. (Patient taking differently: Apply 4 g topically as needed.), Disp: 300 g, Rfl: 1   famotidine (PEPCID) 20 MG tablet, Take 20 mg by mouth 2 (two) times daily., Disp: , Rfl:    fludrocortisone (FLORINEF) 0.1 MG tablet, Take 1 tablet (0.1 mg total) by mouth daily., Disp: 60 tablet, Rfl: 0   hydrocortisone (CORTEF) 20 MG tablet, Take 0.5-1 tablets (10-20 mg total) by mouth 2 (two) times daily. 1 in am and half at night, Disp: 60 tablet, Rfl: 0  Multiple Vitamins-Minerals (MENS MULTIVITAMIN PLUS) TABS, Take by mouth., Disp: , Rfl:    Omega-3 Fatty Acids (FISH OIL) 1000 MG CAPS, Take by mouth daily., Disp: , Rfl:    oxybutynin (DITROPAN) 5 MG tablet, Take 1 tablet (5 mg total) by mouth every 8 (eight) hours as needed for bladder spasms., Disp: 30 tablet, Rfl: 0   Azelastine-Fluticasone 137-50 MCG/ACT SUSP, Place 1 spray into the nose every 12 (twelve) hours. (Patient not taking: Reported on 12/22/2021), Disp: 23 g, Rfl: 2   citalopram (CELEXA) 20 MG tablet, Take 1 tablet (20 mg total) by mouth daily., Disp: 90 tablet, Rfl: 1   levocetirizine (XYZAL) 5 MG tablet, Take 1 tablet (5 mg total) by mouth every evening., Disp: 90 tablet, Rfl: 1   lisinopril (ZESTRIL) 40 MG tablet, Take 1 tablet (40 mg total) by mouth daily., Disp: 90 tablet, Rfl: 1   rosuvastatin (CRESTOR) 20 MG tablet, Take 1 tablet (20 mg total) by mouth daily., Disp: 90 tablet, Rfl: 1   SENNA-PLUS 8.6-50 MG tablet, Take 1 tablet by mouth every morning. (Patient not taking: Reported on 12/22/2021), Disp: , Rfl:   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   Lipitor [Atorvastatin] Hives and Itching   Other    Viagra  [Sildenafil Citrate]     vision loss    I personally reviewed active problem list, medication list,  allergies, family history, social history, health maintenance with the patient/caregiver today.   ROS  Constitutional: Negative for fever or weight change.  Respiratory: Negative for cough and shortness of breath.   Cardiovascular: Negative for chest pain or palpitations.  Gastrointestinal: Negative for abdominal pain, no bowel changes.  Musculoskeletal: Negative for gait problem or joint swelling.  Skin: Negative for rash.  Neurological: Negative for dizziness or headache.  No other specific complaints in a complete review of systems (except as listed in HPI above).   Objective  Vitals:   12/22/21 0744  BP: 134/76  Pulse: 93  Resp: 16  SpO2: 98%  Weight: (!) 334 lb (151.5 kg)  Height: '6\' 4"'$  (1.93 m)    Body mass index is 40.66 kg/m.  Physical Exam  Constitutional: Patient appears well-developed and well-nourished. Obese  No distress.  HEENT: head atraumatic, normocephalic, pupils equal and reactive to light, neck supple Cardiovascular: Normal rate, regular rhythm and normal heart sounds.  No murmur heard. Trace BLE edema. Pulmonary/Chest: Effort normal and breath sounds normal. No respiratory distress. Abdominal: Soft.  There is no tenderness. Psychiatric: Patient has a normal mood and affect. behavior is normal. Judgment and thought content normal.  Skin: left index finger shows superficial laceration including nail but not nail base   Recent Results (from the past 2160 hour(s))  Glucose, capillary     Status: Abnormal   Collection Time: 09/28/21  6:26 AM  Result Value Ref Range   Glucose-Capillary 175 (H) 70 - 99 mg/dL    Comment: Glucose reference range applies only to samples taken after fasting for at least 8 hours.  Surgical pathology     Status: None   Collection Time: 09/28/21  8:38 AM  Result Value Ref Range   SURGICAL PATHOLOGY      SURGICAL PATHOLOGY CASE: [id number] PATIENT: Omer Jack Surgical Pathology Report     Specimen Submitted: A.  Prostate chips  Clinical History: Benign prostatic hyperplasia with urinary obstruction      DIAGNOSIS: A.  PROSTATE CHIPS; TURP: -  BENIGN PROSTATIC GLANDULAR AND STROMAL HYPERPLASIA. - NEGATIVE FOR MALIGNANCY.  GROSS DESCRIPTION: A. Labeled: Prostate chips Received: Fresh Collection time: 8:30 AM on 09/28/2021 Placed into formalin time: 8:56 AM on 09/28/2021 Type of procedure: Transurethral resection of the prostate Weight: 5.73 grams Size: Aggregate, 8.8 x 2.9 x 0.5 cm Description: Received are fragments of tan-pink soft tissue. Percentage of tissue submitted for microscopic review: 100%  Block summary: 1 - 5 - soft tissue fragments, submitted entirely  RB 09/28/2021  Final Diagnosis performed by Quay Burow, MD.   Electronically signed 09/29/2021 9:52:45AM The electronic signature indicates that the named Attending Pathologist has ev aluated the specimen Technical component performed at [facility], [address] [place], [place] [address] Lab: [id number] Dir: Rush Farmer, MD, MMM  Professional component performed at [facility], [facility], [place], [place], [place] [address] Lab: [phone] Dir: Kathi Simpers, MD   Glucose, capillary     Status: Abnormal   Collection Time: 09/28/21  8:57 AM  Result Value Ref Range   Glucose-Capillary 184 (H) 70 - 99 mg/dL    Comment: Glucose reference range applies only to samples taken after fasting for at least 8 hours.  Bladder Scan (Post Void Residual) in office     Status: None   Collection Time: 11/18/21  9:04 AM  Result Value Ref Range   Scan Result 12   POCT HgB A1C     Status: Abnormal   Collection Time: 12/22/21  7:55 AM  Result Value Ref Range   Hemoglobin A1C 7.4 (A) 4.0 - 5.6 %   HbA1c POC (<> result, manual entry)     HbA1c, POC (prediabetic range)     HbA1c, POC (controlled diabetic range)      Diabetic Foot Exam: Diabetic Foot Exam - Simple   Simple Foot Form Visual  Inspection See comments: Yes Sensation Testing Intact to touch and monofilament testing bilaterally: Yes Pulse Check Posterior Tibialis and Dorsalis pulse intact bilaterally: Yes Comments Thick toenails.       PHQ2/9:    12/22/2021    7:53 AM 08/18/2021    7:56 AM 04/16/2021   10:48 AM 04/02/2021    7:56 AM 11/27/2020    7:32 AM  Depression screen PHQ 2/9  Decreased Interest 0 0 0 0 0  Down, Depressed, Hopeless 0 0 0 0 0  PHQ - 2 Score 0 0 0 0 0  Altered sleeping 0 0 0  0  Tired, decreased energy 0 0 0  0  Change in appetite 0 0 0  0  Feeling bad or failure about yourself  0  0  0  Trouble concentrating 0 0 0  0  Moving slowly or fidgety/restless 0 0 0  0  Suicidal thoughts 0 0 0  0  PHQ-9 Score 0 0 0  0  Difficult doing work/chores  Not difficult at all       phq 9 is negative   Fall Risk:    12/22/2021    7:53 AM 08/18/2021    7:55 AM 04/16/2021   10:48 AM 04/02/2021    7:56 AM 11/27/2020    7:31 AM  Fall Risk   Falls in the past year? 0 0 0 0 0  Number falls in past yr: 0 0 0 0 0  Injury with Fall? 0 0 0 0 0  Risk for fall due to : No Fall Risks  No Fall Risks  No Fall Risks  Follow up Falls prevention discussed  Falls prevention discussed Falls evaluation completed Falls prevention discussed      Functional Status Survey: Is the patient deaf or have difficulty hearing?: No Does the patient have difficulty seeing, even when wearing glasses/contacts?: No Does the patient have difficulty concentrating, remembering, or making decisions?: No Does the patient have difficulty walking or climbing stairs?: No Does the patient have difficulty dressing or bathing?: No Does the patient have difficulty doing errands alone such as visiting a doctor's office or shopping?: No    Assessment & Plan  1. Type 2 diabetes mellitus with other diabetic kidney complication (HCC)  - POCT HgB A1C - HM Diabetes Foot Exam - Dapagliflozin-metFORMIN HCl ER (XIGDUO XR) 01-999 MG  TB24; Take 1 tablet by mouth daily.  Dispense: 90 tablet; Refill: 1  2. Adrenal insufficiency ([place])  Keep follow up with Endo  3. COPD with asthma ([place])  Doing well  4. Atherosclerosis of aorta (HCC)  Continue Rosuvastatin  5. Anemia of chronic disease   6. Need for immunization against influenza  - Flu Vaccine QUAD 6+ mos PF IM (Fluarix Quad PF)  7. Obstructive apnea   8. Perennial allergic rhinitis with seasonal variation  - levocetirizine (XYZAL) 5 MG tablet; Take 1 tablet (5 mg total) by mouth every evening.  Dispense: 90 tablet; Refill: 1  9. Depression with anxiety  - citalopram (CELEXA) 20 MG tablet; Take 1 tablet (20 mg total) by mouth daily.  Dispense: 90 tablet; Refill: 1  10. Benign hypertension  - lisinopril (ZESTRIL) 40 MG tablet; Take 1 tablet (40 mg total) by mouth daily.  Dispense: 90 tablet; Refill: 1  11. Dyslipidemia  - rosuvastatin (CRESTOR) 20 MG tablet; Take 1 tablet (20 mg total) by mouth daily.  Dispense: 90 tablet; Refill: 1  12. Need for Tdap vaccination  - Tdap vaccine greater than or equal to 7yo IM

## 2021-12-22 ENCOUNTER — Encounter: Payer: Self-pay | Admitting: Family Medicine

## 2021-12-22 ENCOUNTER — Ambulatory Visit: Payer: BC Managed Care – PPO | Admitting: Family Medicine

## 2021-12-22 VITALS — BP 134/76 | HR 93 | Resp 16 | Ht 76.0 in | Wt 334.0 lb

## 2021-12-22 DIAGNOSIS — J302 Other seasonal allergic rhinitis: Secondary | ICD-10-CM

## 2021-12-22 DIAGNOSIS — I10 Essential (primary) hypertension: Secondary | ICD-10-CM

## 2021-12-22 DIAGNOSIS — J449 Chronic obstructive pulmonary disease, unspecified: Secondary | ICD-10-CM

## 2021-12-22 DIAGNOSIS — E1129 Type 2 diabetes mellitus with other diabetic kidney complication: Secondary | ICD-10-CM | POA: Diagnosis not present

## 2021-12-22 DIAGNOSIS — E2740 Unspecified adrenocortical insufficiency: Secondary | ICD-10-CM | POA: Diagnosis not present

## 2021-12-22 DIAGNOSIS — Z23 Encounter for immunization: Secondary | ICD-10-CM

## 2021-12-22 DIAGNOSIS — J3089 Other allergic rhinitis: Secondary | ICD-10-CM

## 2021-12-22 DIAGNOSIS — I700 Atherosclerosis of aorta: Secondary | ICD-10-CM | POA: Diagnosis not present

## 2021-12-22 DIAGNOSIS — D638 Anemia in other chronic diseases classified elsewhere: Secondary | ICD-10-CM

## 2021-12-22 DIAGNOSIS — J4489 Other specified chronic obstructive pulmonary disease: Secondary | ICD-10-CM

## 2021-12-22 DIAGNOSIS — G4733 Obstructive sleep apnea (adult) (pediatric): Secondary | ICD-10-CM

## 2021-12-22 DIAGNOSIS — F418 Other specified anxiety disorders: Secondary | ICD-10-CM

## 2021-12-22 DIAGNOSIS — E785 Hyperlipidemia, unspecified: Secondary | ICD-10-CM

## 2021-12-22 LAB — POCT GLYCOSYLATED HEMOGLOBIN (HGB A1C): Hemoglobin A1C: 7.4 % — AB (ref 4.0–5.6)

## 2021-12-22 MED ORDER — LISINOPRIL 20 MG PO TABS: 20.0000 mg | ORAL_TABLET | Freq: Every day | ORAL | 1 refills | Status: DC

## 2021-12-22 MED ORDER — CITALOPRAM HYDROBROMIDE 20 MG PO TABS: 20.0000 mg | ORAL_TABLET | Freq: Every day | ORAL | 1 refills | Status: DC

## 2021-12-22 MED ORDER — ROSUVASTATIN CALCIUM 20 MG PO TABS: 20.0000 mg | ORAL_TABLET | Freq: Every day | ORAL | 1 refills | Status: DC

## 2021-12-22 MED ORDER — LISINOPRIL 40 MG PO TABS: 40.0000 mg | ORAL_TABLET | Freq: Every day | ORAL | 1 refills | Status: DC

## 2021-12-22 MED ORDER — LEVOCETIRIZINE DIHYDROCHLORIDE 5 MG PO TABS: 5.0000 mg | ORAL_TABLET | Freq: Every evening | ORAL | 1 refills | Status: DC

## 2021-12-22 MED ORDER — XIGDUO XR 10-1000 MG PO TB24: 1.0000 | ORAL_TABLET | Freq: Every day | ORAL | 1 refills | Status: DC

## 2021-12-25 ENCOUNTER — Encounter: Payer: Self-pay | Admitting: Family Medicine

## 2021-12-25 ENCOUNTER — Other Ambulatory Visit: Payer: Self-pay | Admitting: Family Medicine

## 2021-12-25 DIAGNOSIS — J449 Chronic obstructive pulmonary disease, unspecified: Secondary | ICD-10-CM

## 2021-12-25 MED ORDER — METFORMIN HCL 1000 MG PO TABS: 1000.0000 mg | ORAL_TABLET | Freq: Two times a day (BID) | ORAL | 1 refills | Status: DC

## 2022-01-04 ENCOUNTER — Encounter: Payer: Self-pay | Admitting: Family Medicine

## 2022-01-26 ENCOUNTER — Other Ambulatory Visit: Payer: Self-pay | Admitting: Family Medicine

## 2022-01-26 DIAGNOSIS — J4489 Other specified chronic obstructive pulmonary disease: Secondary | ICD-10-CM

## 2022-02-24 ENCOUNTER — Other Ambulatory Visit: Payer: Self-pay | Admitting: Family Medicine

## 2022-02-24 DIAGNOSIS — J4489 Other specified chronic obstructive pulmonary disease: Secondary | ICD-10-CM

## 2022-03-22 NOTE — Progress Notes (Signed)
Name: Isaac Cross   MRN: [id number]    DOB: 07-26-1960   Date:03/23/2022       Progress Note  Subjective  Chief Complaint  Follow Up  I connected with  Isaac Cross  on 03/23/22 at  7:40 AM EST by a video enabled telemedicine application and verified that I am speaking with the correct person using two identifiers.  I discussed the limitations of evaluation and management by telemedicine and the availability of in person appointments. The patient expressed understanding and agreed to proceed with the virtual visit  Staff also discussed with the patient that there may be a patient responsible charge related to this service. Patient Location: at home Provider Location: at home - pending results of COVID Additional Individuals present: at home   HPI  S/P bilateral total knee Replacements:  It was done by  Dr. Odis Luster on May 15th, 2020 in [place]. He continues to do well.    COPD/Asthma syndrome :usually moderate but currently under control with medication, he states over the past two days he has developed clear rhinorrhea, wife has similar symptoms and also a neighbor. He is up to date with flu and COVID-19 vaccine. Denies fever, chills, and is feeling well otherwise. No cough or SOB. Discussed importance of testing for COVID within 5 days of symptom onset if he would like to take medications for it   AR: he is taking Xyzal and using nasal spray prn  OSA/COPD overlaping syndrome: he wears it every night CPAP machine, he needs new supplies and we will place order today    DMII:   A1C went from 6.4 % to 6.9 %  today is up to 7.4 % . He is taking Metformin 1000 mg BID, discussed GLP-1 agonist and SGL-2 agonist . He denies polyphagia, polydipsia or polyuria . Urine micro was up   He has associated dyslipidemia, obesity , HTN and microalbuminuria. We gave him Davonna Belling but he decided to continue Metformin and changed his diet. We will recheck A1C today    HTN: he is down to 20 mg ( not 40 mg  since 12/2021 ) and he will go to the office today to recheck bp, no dizziness, chest pain or palpitation .    Atherosclerosis  aorta: taking statin therapy, last LDL was 76 and he is now on higher dose of Rosuvastatin at 20 mg and we will recheck level    Adrenal insufficiency/Adrenal mass bilaterally : size of adrenal glands improved still taking medication given by Dr. Gershon Crane , it happened while hospitalized and masses likely from subdural hematoma and he is doing well on medications. Unchanged    Depression/anxiety:  Phq 9 is negative. He states symptoms are controlled and wants to continue on medications  LUTS: he states symptoms started after hospitalization and ICU stay back in 2020, he was seen by Urologist and had TURP 09/2021, he is feeling better . He has a slight elevation of PSA in May and discussed repeating it today, but he will see urologist in Feb and decided to wait until that appointment    Patient Active Problem List   Diagnosis Date Noted   Adrenal insufficiency (HCC) 03/23/2022   Microalbuminuria 03/23/2022   History of total knee arthroplasty 09/04/2018   Coronary atherosclerosis due to calcified coronary lesion 12/02/2017   Atherosclerosis of aorta (HCC) 06/17/2015   Fatty liver disease, nonalcoholic 06/17/2015   Renal cyst, right 06/17/2015   BPH (benign prostatic hyperplasia) 05/25/2015   Microscopic  hematuria 05/25/2015   Second degree hemorrhoids    Diverticulosis of large intestine without diverticulitis    Asthma, mild intermittent, well-controlled 10/16/2014   Allergic rhinitis 10/13/2014   ED (erectile dysfunction) of organic origin 07/22/2014   Gastro-esophageal reflux disease without esophagitis 07/22/2014   Benign hypertension 07/22/2014   Type 2 diabetes mellitus with other diabetic kidney complication (HCC) 07/22/2014   Adult BMI 30+ 07/22/2014   Asthma with chronic obstructive pulmonary disease (COPD) 07/22/2014   Depression with anxiety  07/22/2014   Dyslipidemia 07/22/2014   Genital herpes 07/22/2014   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   Osteoarthritis of both knees 07/22/2014   OSA and COPD overlap syndrome (HCC) 05/14/2013   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   Blood in the urine 09/20/2006    Past Surgical History:  Procedure Laterality Date   BLADDER REPAIR     COLONOSCOPY WITH PROPOFOL N/A 04/07/2015   Procedure: COLONOSCOPY WITH PROPOFOL;  Surgeon: Midge Minium, MD;  Location: [facility];  Service: Endoscopy;  Laterality: N/A;  Diabetic - oral meds CPAP   HAND SURGERY Left    4TH AND 5TH FINGER   I & D KNEE WITH POLY EXCHANGE Right 09/18/2018   Procedure: IRRIGATION AND DEBRIDEMENT KNEE WITH POLY EXCHANGE;  Surgeon: Lyndle Herrlich, MD;  Location: [facility];  Service: Orthopedics;  Laterality: Right;   KNEE ARTHROSCOPY Right    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   TOTAL KNEE ARTHROPLASTY Left 09/17/2018   Procedure: I &D left knee and polyethylene exchange;  Surgeon: Lyndle Herrlich, MD;  Location: [facility];  Service: Orthopedics;  Laterality: Left;   TRANSURETHRAL RESECTION OF PROSTATE N/A 09/28/2021   Procedure: TRANSURETHRAL RESECTION OF THE PROSTATE (TURP);  Surgeon: Vanna Scotland, MD;  Location: [facility];  Service: Urology;  Laterality: N/A;   TRANSURETHRAL RESECTION OF PROSTATE  09/28/2021   VEIN SURGERY Right     Family History  Problem Relation Age of Onset   Hypertension Mother    Diabetes Mother    Diabetes Father    Hypertension Father    Cancer Father    Seizures Daughter    Lupus Daughter    Cancer Maternal Grandfather        Prostate   Diabetes Paternal Grandfather    Prostate cancer Maternal Uncle    Bladder Cancer Neg Hx    Kidney cancer Neg Hx     Social History   Socioeconomic History   Marital status: Married    Spouse name: Isaac Cross   Number of children: 2   Years of education: Not on file   Highest education level: Bachelor's degree  (e.g., BA, AB, BS)  Occupational History   Occupation: case Production designer, theatre/television/film   Tobacco Use   Smoking status: Former    Packs/day: 1.00    Years: 10.00    Total pack years: 10.00    Types: Cigarettes    Start date: 04/20/1995    Quit date: 04/19/2005    Years since quitting: 16.9   Smokeless tobacco: Never  Vaping Use   Vaping Use: Never used  Substance and Sexual Activity   Alcohol use: Yes    Alcohol/week: 10.0 standard drinks of alcohol    Types: 10 Cans of beer per week    Comment: occasional   Drug use: No   Sexual activity: Yes    Partners: Female  Other Topics Concern   Not on file  Social History Narrative   Lives at home with wife, using a  walker since his surgery recently.   Social Determinants of Health   Financial Resource Strain: Low Risk  (07/13/2017)   Overall Financial Resource Strain (CARDIA)    Difficulty of Paying Living Expenses: Not hard at all  Food Insecurity: No Food Insecurity (07/13/2017)   Hunger Vital Sign    Worried About Running Out of Food in the Last Year: Never true    Ran Out of Food in the Last Year: Never true  Transportation Needs: No Transportation Needs (07/13/2017)   PRAPARE - Administrator, Civil Service (Medical): No    Lack of Transportation (Non-Medical): No  Physical Activity: Inactive (07/13/2017)   Exercise Vital Sign    Days of Exercise per Week: 0 days    Minutes of Exercise per Session: 0 min  Stress: No Stress Concern Present (07/13/2017)   [employer] of Occupational Health - Occupational Stress Questionnaire    Feeling of Stress : Not at all  Social Connections: Somewhat Isolated (07/13/2017)   Social Connection and Isolation Panel [NHANES]    Frequency of Communication with Friends and Family: More than three times a week    Frequency of Social Gatherings with Friends and Family: Once a week    Attends Religious Services: Never    Database administrator or Organizations: No    Attends Banker  Meetings: Never    Marital Status: Married  Catering manager Violence: Not At Risk (07/13/2017)   Humiliation, Afraid, Rape, and Kick questionnaire    Fear of Current or Ex-Partner: No    Emotionally Abused: No    Physically Abused: No    Sexually Abused: No     Current Outpatient Medications:    acetaminophen (TYLENOL) 500 MG tablet, Take 1 tablet (500 mg total) by mouth every 6 (six) hours as needed., Disp: 90 tablet, Rfl: 0   acetic acid-hydrocortisone (VOSOL-HC) OTIC solution, Place 3 drops into both ears 2 (two) times daily. (Patient taking differently: Place 3 drops into both ears as needed.), Disp: 10 mL, Rfl: 0   acidophilus (RISAQUAD) CAPS capsule, Take 1 capsule by mouth daily., Disp: 30 capsule, Rfl: 0   albuterol (VENTOLIN HFA) 108 (90 Base) MCG/ACT inhaler, INHALE 2 PUFFS BY MOUTH EVERY 6 HOURS AS NEEDED, Disp: 8.5 each, Rfl: 0   Azelastine-Fluticasone 137-50 MCG/ACT SUSP, Place 1 spray into the nose every 12 (twelve) hours., Disp: 23 g, Rfl: 2   BREO ELLIPTA 100-25 MCG/ACT AEPB, TAKE 1 PUFF BY MOUTH EVERY DAY, Disp: 180 each, Rfl: 1   citalopram (CELEXA) 20 MG tablet, Take 1 tablet (20 mg total) by mouth daily., Disp: 90 tablet, Rfl: 1   diclofenac Sodium (VOLTAREN) 1 % GEL, Apply 4 g topically 4 (four) times daily. (Patient taking differently: Apply 4 g topically as needed.), Disp: 300 g, Rfl: 1   famotidine (PEPCID) 20 MG tablet, Take 20 mg by mouth 2 (two) times daily., Disp: , Rfl:    fludrocortisone (FLORINEF) 0.1 MG tablet, Take 1 tablet (0.1 mg total) by mouth daily., Disp: 60 tablet, Rfl: 0   hydrocortisone (CORTEF) 20 MG tablet, Take 0.5-1 tablets (10-20 mg total) by mouth 2 (two) times daily. 1 in am and half at night, Disp: 60 tablet, Rfl: 0   levocetirizine (XYZAL) 5 MG tablet, Take 1 tablet (5 mg total) by mouth every evening., Disp: 90 tablet, Rfl: 1   lisinopril (ZESTRIL) 40 MG tablet, Take 1 tablet (40 mg total) by mouth daily., Disp: 90 tablet, Rfl: 1  metFORMIN (GLUCOPHAGE) 1000 MG tablet, Take 1 tablet (1,000 mg total) by mouth 2 (two) times daily with a meal., Disp: 180 tablet, Rfl: 1   Multiple Vitamins-Minerals (MENS MULTIVITAMIN PLUS) TABS, Take by mouth., Disp: , Rfl:    Omega-3 Fatty Acids (FISH OIL) 1000 MG CAPS, Take by mouth daily., Disp: , Rfl:    oxybutynin (DITROPAN) 5 MG tablet, Take 1 tablet (5 mg total) by mouth every 8 (eight) hours as needed for bladder spasms., Disp: 30 tablet, Rfl: 0   rosuvastatin (CRESTOR) 20 MG tablet, Take 1 tablet (20 mg total) by mouth daily., Disp: 90 tablet, Rfl: 1  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   ROS  Constitutional: Negative for fever or weight change.  Respiratory: Negative for cough and shortness of breath.   Cardiovascular: Negative for chest pain or palpitations.  Gastrointestinal: Negative for abdominal pain, no bowel changes.  Musculoskeletal: Negative for gait problem or joint swelling.  Skin: Negative for rash.  Neurological: Negative for dizziness or headache.  No other specific complaints in a complete review of systems (except as listed in HPI above).   Objective  Vitals:   03/23/22 0832  BP: 136/82  Pulse: 91  Resp: 16  SpO2: 99%     Body mass index is 40.9 kg/m.  Physical Exam  Awake, alert and oriented, no distress   PHQ2/9:    03/23/2022    7:40 AM 12/22/2021    7:53 AM 08/18/2021    7:56 AM 04/16/2021   10:48 AM 04/02/2021    7:56 AM  Depression screen PHQ 2/9  Decreased Interest 0 0 0 0 0  Down, Depressed, Hopeless 0 0 0 0 0  PHQ - 2 Score 0 0 0 0 0  Altered sleeping 0 0 0 0   Tired, decreased energy 0 0 0 0   Change in  appetite 0 0 0 0   Feeling bad or failure about yourself  0 0  0   Trouble concentrating 0 0 0 0   Moving slowly or fidgety/restless 0 0 0 0   Suicidal thoughts 0 0 0 0   PHQ-9 Score 0 0 0 0   Difficult doing work/chores   Not difficult at all     PHQ-2/9 Result is negative.    Fall Risk:    03/23/2022    7:39 AM 12/22/2021    7:53 AM 08/18/2021    7:55 AM 04/16/2021   10:48 AM 04/02/2021    7:56 AM  Fall Risk   Falls in the past year? 0 0 0 0 0  Number falls in past yr: 0 0 0 0 0  Injury with Fall? 0 0 0 0 0  Risk for fall due to : No Fall Risks No Fall Risks  No Fall Risks   Follow up Falls prevention discussed Falls prevention discussed  Falls prevention discussed Falls evaluation completed     Assessment & Plan  1. Atherosclerosis of aorta (HCC)  - Lipid panel - COMPLETE METABOLIC PANEL WITH GFR  2. Type 2 diabetes mellitus with other diabetic kidney complication (HCC)  - Hemoglobin A1c  3. Adrenal insufficiency (HCC)  Keep  follow up with Endo   4. Dyslipidemia  On higher dose statin and we will recheck labs   5. Long-term use of high-risk medication  - COMPLETE METABOLIC PANEL WITH GFR  6. OSA and COPD overlap syndrome (HCC)  Needs new supplies for CPAP machine   7. Asthma with chronic obstructive pulmonary disease (COPD)  Stable   8. Microalbuminuria  On ACE  9. URI, acute  Doing well at this time   I discussed the assessment and treatment plan with the patient. The patient was provided an opportunity to ask questions and all were answered. The patient agreed with the plan and demonstrated an understanding of the instructions.  The patient was advised to call back or seek an in-person evaluation if the symptoms worsen or if the condition fails to improve as anticipated.  I provided 25 minutes of non-face-to-face time during this encounter.

## 2022-03-23 ENCOUNTER — Encounter: Payer: Self-pay | Admitting: Family Medicine

## 2022-03-23 ENCOUNTER — Telehealth: Payer: BC Managed Care – PPO | Admitting: Family Medicine

## 2022-03-23 VITALS — BP 136/82 | HR 91 | Resp 16 | Ht 76.0 in | Wt 336.0 lb

## 2022-03-23 DIAGNOSIS — I700 Atherosclerosis of aorta: Secondary | ICD-10-CM | POA: Diagnosis not present

## 2022-03-23 DIAGNOSIS — J069 Acute upper respiratory infection, unspecified: Secondary | ICD-10-CM

## 2022-03-23 DIAGNOSIS — E785 Hyperlipidemia, unspecified: Secondary | ICD-10-CM

## 2022-03-23 DIAGNOSIS — J4489 Other specified chronic obstructive pulmonary disease: Secondary | ICD-10-CM

## 2022-03-23 DIAGNOSIS — G4733 Obstructive sleep apnea (adult) (pediatric): Secondary | ICD-10-CM

## 2022-03-23 DIAGNOSIS — Z79899 Other long term (current) drug therapy: Secondary | ICD-10-CM

## 2022-03-23 DIAGNOSIS — R809 Proteinuria, unspecified: Secondary | ICD-10-CM | POA: Insufficient documentation

## 2022-03-23 DIAGNOSIS — E2740 Unspecified adrenocortical insufficiency: Secondary | ICD-10-CM | POA: Insufficient documentation

## 2022-03-23 DIAGNOSIS — E1129 Type 2 diabetes mellitus with other diabetic kidney complication: Secondary | ICD-10-CM | POA: Diagnosis not present

## 2022-03-24 ENCOUNTER — Encounter: Payer: Self-pay | Admitting: Family Medicine

## 2022-03-24 LAB — COMPLETE METABOLIC PANEL WITH GFR
AG Ratio: 1.6 (calc) (ref 1.0–2.5)
ALT: 25 U/L (ref 9–46)
AST: 29 U/L (ref 10–35)
Albumin: 4.4 g/dL (ref 3.6–5.1)
Alkaline phosphatase (APISO): 40 U/L (ref 35–144)
BUN: 15 mg/dL (ref 7–25)
CO2: 29 mmol/L (ref 20–32)
Calcium: 9.6 mg/dL (ref 8.6–10.3)
Chloride: 99 mmol/L (ref 98–110)
Creat: 1.2 mg/dL (ref 0.70–1.35)
Globulin: 2.7 g/dL (calc) (ref 1.9–3.7)
Glucose, Bld: 165 mg/dL — ABNORMAL HIGH (ref 65–99)
Potassium: 3.9 mmol/L (ref 3.5–5.3)
Sodium: 137 mmol/L (ref 135–146)
Total Bilirubin: 0.3 mg/dL (ref 0.2–1.2)
Total Protein: 7.1 g/dL (ref 6.1–8.1)
eGFR: 68 mL/min/{1.73_m2} (ref >=60)

## 2022-03-24 LAB — HEMOGLOBIN A1C
Hgb A1c MFr Bld: 7.9 % of total Hgb — ABNORMAL HIGH (ref <5.7)
Mean Plasma Glucose: 180 mg/dL
eAG (mmol/L): 10 mmol/L

## 2022-03-24 LAB — LIPID PANEL
Cholesterol: 137 mg/dL (ref <200)
HDL: 50 mg/dL (ref >=40)
LDL Cholesterol (Calc): 61 mg/dL (calc)
Non-HDL Cholesterol (Calc): 87 mg/dL (calc) (ref <130)
Total CHOL/HDL Ratio: 2.7 (calc) (ref <5.0)
Triglycerides: 183 mg/dL — ABNORMAL HIGH (ref <150)

## 2022-03-26 ENCOUNTER — Other Ambulatory Visit: Payer: Self-pay | Admitting: Family Medicine

## 2022-03-26 DIAGNOSIS — J4489 Other specified chronic obstructive pulmonary disease: Secondary | ICD-10-CM

## 2022-05-03 ENCOUNTER — Other Ambulatory Visit: Payer: Self-pay | Admitting: Family Medicine

## 2022-05-03 DIAGNOSIS — H6063 Unspecified chronic otitis externa, bilateral: Secondary | ICD-10-CM

## 2022-05-04 ENCOUNTER — Other Ambulatory Visit: Payer: Self-pay | Admitting: Family Medicine

## 2022-05-04 DIAGNOSIS — H6063 Unspecified chronic otitis externa, bilateral: Secondary | ICD-10-CM

## 2022-05-05 MED ORDER — HYDROCORTISONE-ACETIC ACID 1-2 % OT SOLN: 3.0000 [drp] | Freq: Two times a day (BID) | OTIC | 0 refills | Status: AC

## 2022-05-21 ENCOUNTER — Other Ambulatory Visit: Payer: BC Managed Care – PPO

## 2022-05-21 DIAGNOSIS — N401 Enlarged prostate with lower urinary tract symptoms: Secondary | ICD-10-CM

## 2022-05-22 LAB — PSA: Prostate Specific Ag, Serum: 1.6 ng/mL (ref 0.0–4.0)

## 2022-05-25 ENCOUNTER — Ambulatory Visit: Payer: BC Managed Care – PPO | Admitting: Urology

## 2022-05-25 VITALS — BP 121/73 | HR 109 | Ht 76.0 in | Wt 335.4 lb

## 2022-05-25 DIAGNOSIS — N401 Enlarged prostate with lower urinary tract symptoms: Secondary | ICD-10-CM | POA: Diagnosis not present

## 2022-05-25 DIAGNOSIS — R350 Frequency of micturition: Secondary | ICD-10-CM

## 2022-05-25 LAB — BLADDER SCAN AMB NON-IMAGING: Scan Result: 1

## 2022-05-25 NOTE — Progress Notes (Signed)
I, Isaac Cross,acting as a scribe for Isaac Espy, MD.,have documented all relevant documentation on the behalf of Isaac Espy, MD,as directed by  Isaac Espy, MD while in the presence of Isaac Espy, MD.   I, Isaac Cross,acting as a scribe for Isaac Espy, MD.,have documented all relevant documentation on the behalf of Isaac Espy, MD,as directed by  Isaac Espy, MD while in the presence of Isaac Espy, MD.   05/25/22 10:56 AM   Isaac Cross [birth year] [id number]  Referring provider: Steele Sizer, MD [address] [place],  [place] [address]  Chief Complaint  Patient presents with   Benign Prostatic Hypertrophy    HPI: 62 year-old male with a personal history of BPH and microscopic hematuria who returns today for a 6 month follow up.   He underwent a TURP for small median lobe on 09/28/2021. His surgical pathology was negative. At his last visit he reported having issues with urgency and urge incontinence, which have since resolved.  His most recent PSA was 1.6 on 05/21/22.  He reports no current use of medications for his prostate, including oxybutynin, which he has discontinued. He is pleased with his urinary symptoms.   Results for orders placed or performed in visit on 05/25/22  Bladder Scan (Post Void Residual) in office  Result Value Ref Range   Scan Result 1 ml      IPSS     Row Name 05/25/22 0900         International Prostate Symptom Score   How often have you had the sensation of not emptying your bladder? Not at All     How often have you had to urinate less than every two hours? Less than 1 in 5 times     How often have you found you stopped and started again several times when you urinated? Not at All     How often have you found it difficult to postpone urination? Less than 1 in 5 times     How often have you had a weak urinary stream? Less than 1 in 5 times     How often have you had to strain to start urination?  Less than 1 in 5 times     How many times did you typically get up at night to urinate? 2 Times     Total IPSS Score 6       Quality of Life due to urinary symptoms   If you were to spend the rest of your life with your urinary condition just the way it is now how would you feel about that? Delighted              Score:  1-7 Mild 8-19 Moderate 20-35 Severe   PMH: Past Medical History:  Diagnosis Date   Allergy    Anemia    Anxiety    Arthritis    knees   Asthma, mild intermittent, well-controlled    Chronic obstructive asthma    COPD (chronic obstructive pulmonary disease) (HCC)    Depression with anxiety    Diabetes (HCC)    GERD (gastroesophageal reflux disease)    HBP (high blood pressure)    High cholesterol    Renal cyst, right    Sleep apnea    CPAP   Swelling     Surgical History: Past Surgical History:  Procedure Laterality Date   BLADDER REPAIR     COLONOSCOPY WITH PROPOFOL N/A 04/07/2015   Procedure: COLONOSCOPY  WITH PROPOFOL;  Surgeon: Lucilla Lame, MD;  Location: [place];  Service: Endoscopy;  Laterality: N/A;  Diabetic - oral meds CPAP   HAND SURGERY Left    4TH AND 5TH FINGER   I & D KNEE WITH POLY EXCHANGE Right 09/18/2018   Procedure: IRRIGATION AND DEBRIDEMENT KNEE WITH POLY EXCHANGE;  Surgeon: Lovell Sheehan, MD;  Location: [facility];  Service: Orthopedics;  Laterality: Right;   KNEE ARTHROSCOPY Right    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   TOTAL KNEE ARTHROPLASTY Left 09/17/2018   Procedure: I &D left knee and polyethylene exchange;  Surgeon: Lovell Sheehan, MD;  Location: [facility];  Service: Orthopedics;  Laterality: Left;   TRANSURETHRAL RESECTION OF PROSTATE N/A 09/28/2021   Procedure: TRANSURETHRAL RESECTION OF THE PROSTATE (TURP);  Surgeon: Isaac Espy, MD;  Location: [facility];  Service: Urology;  Laterality: N/A;   TRANSURETHRAL RESECTION OF PROSTATE  09/28/2021   VEIN SURGERY Right     Home  Medications:  Allergies as of 05/25/2022       Reactions   Heparin    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 If you have any questions, ask your nurse or doctor.          acetaminophen 500 MG tablet Commonly known as: TYLENOL Take 1 tablet (500 mg total) by mouth every 6 (six) hours as needed.   acetic acid-hydrocortisone OTIC solution Commonly known as: VOSOL-HC Place 3 drops into both ears 2 (two) times daily.   acidophilus Caps capsule Take 1 capsule by mouth daily.   albuterol 108 (90 Base) MCG/ACT inhaler Commonly known as: VENTOLIN HFA INHALE 2 PUFFS BY MOUTH EVERY 6 HOURS AS NEEDED   Azelastine-Fluticasone 137-50 MCG/ACT Susp Place 1 spray into the nose every 12 (twelve) hours.   Breo Ellipta 100-25 MCG/ACT Aepb Generic drug: fluticasone furoate-vilanterol TAKE 1 PUFF BY MOUTH EVERY DAY   citalopram 20 MG tablet Commonly known as: CELEXA Take 1 tablet (20 mg total) by mouth daily.   diclofenac Sodium 1 % Gel Commonly known as: Voltaren Apply 4 g topically 4 (four) times daily. What changed:  when to take this reasons to take this   famotidine 20 MG tablet Commonly known as: PEPCID Take 20 mg by mouth 2 (two) times daily.   Fish Oil 1000 MG Caps Take by mouth daily.   fludrocortisone 0.1 MG tablet Commonly known as: FLORINEF Take 1 tablet (0.1 mg total) by mouth daily.   hydrocortisone 20 MG tablet Commonly known as: CORTEF Take 0.5-1 tablets (10-20 mg total) by mouth 2 (two) times daily. 1 in am and half at night   levocetirizine 5 MG tablet Commonly known as: XYZAL Take 1 tablet (5 mg total) by mouth every evening.   lisinopril 40 MG tablet Commonly known as: ZESTRIL Take 1 tablet (40 mg total) by mouth daily.   Mens Multivitamin Plus Tabs Take by  mouth.   metFORMIN 1000 MG tablet Commonly known as: GLUCOPHAGE Take 1 tablet (1,000 mg total) by mouth 2 (two) times daily with a meal.   oxybutynin 5 MG tablet Commonly known as: DITROPAN Take 1 tablet (5 mg total) by mouth every 8 (eight) hours as needed for bladder spasms.  rosuvastatin 20 MG tablet Commonly known as: CRESTOR Take 1 tablet (20 mg total) by mouth daily.        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 His smoking use included cigarettes. He started smoking about 27 years ago. He has a 10.00 pack-year smoking history. He has never used smokeless tobacco. He reports current alcohol use of about 10.0 standard drinks of alcohol per week. He reports that he does not use drugs.   Physical Exam: BP 121/73   Pulse (!) 109   Ht '6\' 4"'$  (1.93 m)   Wt (!) 335 lb 6 oz (152.1 kg)   BMI 40.82 kg/m   Constitutional:  Alert and oriented, No acute distress. HEENT:  AT, moist mucus membranes.  Trachea midline, no masses. Neurologic: Grossly intact, no focal deficits, moving all 4 extremities. Psychiatric: Normal mood and affect.  Assessment & Plan:    BPH with urinary frequency - S/p TURP  - His PSA level is within normal limits, and he is effectively emptying his bladder.  -No further treatment is necessary at this time. He is pleased with his current  urinary condition and has no complaints. Follow-up as needed if symptoms recur.  Return if symptoms worsen or fail to improve.  I have reviewed the above documentation for accuracy and completeness, and I agree with the above.   Isaac Espy, MD    [facility] [address] [place],  [address] [phone]

## 2022-06-14 ENCOUNTER — Ambulatory Visit
Admission: RE | Admit: 2022-06-14 | Discharge: 2022-06-14 | Disposition: A | Discharge status: Home / Self Care | Payer: BC Managed Care – PPO | Source: Ambulatory Visit | Attending: Acute Care | Admitting: Acute Care

## 2022-06-14 DIAGNOSIS — Z122 Encounter for screening for malignant neoplasm of respiratory organs: Secondary | ICD-10-CM | POA: Insufficient documentation

## 2022-06-14 DIAGNOSIS — K760 Fatty (change of) liver, not elsewhere classified: Secondary | ICD-10-CM | POA: Diagnosis not present

## 2022-06-14 DIAGNOSIS — Z87891 Personal history of nicotine dependence: Secondary | ICD-10-CM | POA: Insufficient documentation

## 2022-06-14 DIAGNOSIS — J432 Centrilobular emphysema: Secondary | ICD-10-CM | POA: Diagnosis not present

## 2022-06-15 ENCOUNTER — Telehealth: Payer: Self-pay | Admitting: Acute Care

## 2022-06-15 NOTE — Telephone Encounter (Signed)
Olivia Mackie called to give a report for CT. Please call to discuss at [phone]

## 2022-06-15 NOTE — Telephone Encounter (Signed)
Received call report from [place] with [facility] on patient's LSCT done on 06/14/22. Sarah, please review the result/impression copied below:  IMPRESSION: 1. Lung-RADS 3, probably benign findings. Short-term follow-up in 6 months is recommended with repeat low-dose chest CT without contrast (please use the following order, "CT CHEST LCS NODULE FOLLOW-UP W/O CM"). 5.9 mm new right parahilar upper lobe nodule may be infectious/inflammatory. 2. Hepatic steatosis. 3.  Centrilobular and paraseptal emphysema evident.  Please advise, thank you.   **also routing to lung nodule pool**

## 2022-06-16 ENCOUNTER — Other Ambulatory Visit: Payer: Self-pay

## 2022-06-16 DIAGNOSIS — R911 Solitary pulmonary nodule: Secondary | ICD-10-CM

## 2022-06-16 DIAGNOSIS — Z87891 Personal history of nicotine dependence: Secondary | ICD-10-CM

## 2022-06-16 NOTE — Telephone Encounter (Signed)
Spoke with patient by phone, using two patient identifiers, to review results of LDCT.  New finding of right lung nodule with recommendation for repeat LDCT in 6 months.  This is noted as a likely benign finding but, as a precaution, would like to repeat the LDCT sooner than the annual scan would be due.  Patient did have covid several months back but no lingering issues.  Atherosclerosis, emphysema and steatosis, as previously noted and patient is on statin medication.  Order placed for 6 months repeat LDCT for nodule follow up.  Results faxed to PCP with plan for repeat CT in 6 months.  Patient in agreement and had no questions.

## 2022-06-19 ENCOUNTER — Other Ambulatory Visit: Payer: Self-pay | Admitting: Family Medicine

## 2022-06-19 DIAGNOSIS — E1129 Type 2 diabetes mellitus with other diabetic kidney complication: Secondary | ICD-10-CM

## 2022-06-21 NOTE — Progress Notes (Unsigned)
Name: Isaac Cross   MRN: [id number]    DOB: 07/29/1960   Date:06/22/2022       Progress Note  Subjective  Chief Complaint  Follow Up  HPI  S/P bilateral total knee Replacements:  It was done by  Dr. Harlow Mares on May 15th, 2020 in [place]. He continues to do well.    COPD with OSA and asthma mild intermittent usually moderate but currently under control with medication -Breo that he takes a few times a week, currently no cough , wheezing or SOB   AR: he is taking Xyzal and using nasal spray prn  OSA/COPD overlaping syndrome: he wears it every night CPAP machine, he needs new supplies and we will place order today    DMII:   A1C went from 6.4 % to 6.9 % it went up 7.4 % and today is 8.5 %  He is  currently taking Xigduo 01/999 and over the past few months he has noticed that glucose is in the 170's range fasting. He still has some old Metformin at home and will add 1000 mg to current regiment but if glucose remains elevated in 2 weeks he will send me a message and we will add Actos ( discussed possible side effects) we also discussed GLP-1 agonist but he prefers Actos.  He has been feeling hungry lately,  polydipsia or polyuria  He has associated dyslipidemia, obesity , HTN and microalbuminuria.    HTN: he is down to 20 mg and bp is at goal, no chest pain, palpitation or sob    Atherosclerosis  aorta: he is now on higher dose of Rosuvastatin at 20 mg , LDL at goal now at 61    Adrenal insufficiency/Adrenal mass bilaterally : size of adrenal glands improved still taking medication given by Dr. Honor Junes , it happened while hospitalized and masses likely from subdural hematoma and he is doing well on medications Stable    Depression/anxiety:  Phq 9 is negative. He states symptoms are controlled and wants to continue taking Celexa   LUTS: he states symptoms started after hospitalization and ICU stay back in 2020, he was seen by Urologist and had TURP 09/2021, he is feeling better . Seen by  urologist recently and released from their care   Family history of breast cancer: mother at age 55 and maternal uncle around age 58, mother has a recurrence now and is getting genetically tested, he will let me know the results   New lung nodule: going back in 6 months for a repeat. He quit smoking many years ago   Patient Active Problem List   Diagnosis Date Noted   Adrenal insufficiency ([place]) 03/23/2022   Microalbuminuria 03/23/2022   History of total knee arthroplasty 09/04/2018   Coronary atherosclerosis due to calcified coronary lesion 12/02/2017   Atherosclerosis of aorta ([place]) 06/17/2015   Fatty liver disease, nonalcoholic [id number]   Renal cyst, right 06/17/2015   BPH (benign prostatic hyperplasia) 05/25/2015   Microscopic hematuria 05/25/2015   Second degree hemorrhoids    Diverticulosis of large intestine without diverticulitis    Asthma, mild intermittent, well-controlled 10/16/2014   Allergic rhinitis 10/13/2014   ED (erectile dysfunction) of organic origin 07/22/2014   Gastro-esophageal reflux disease without esophagitis 07/22/2014   Benign hypertension 07/22/2014   Type 2 diabetes mellitus with other diabetic kidney complication ([place]) [id number]   Adult BMI 30+ 07/22/2014   Asthma with chronic obstructive pulmonary disease (COPD) 07/22/2014   Depression with anxiety 07/22/2014   Dyslipidemia 07/22/2014  Genital herpes 07/22/2014   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   Osteoarthritis of both knees 07/22/2014   OSA and COPD overlap syndrome ([place]) 05/14/2013   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   Blood in the urine 09/20/2006    Past Surgical History:  Procedure Laterality Date   BLADDER REPAIR     COLONOSCOPY WITH PROPOFOL N/A 04/07/2015   Procedure: COLONOSCOPY WITH PROPOFOL;  Surgeon: Lucilla Lame, MD;  Location: [facility];  Service: Endoscopy;  Laterality: N/A;  Diabetic - oral meds CPAP   HAND SURGERY Left    4TH AND 5TH FINGER   I & D  KNEE WITH POLY EXCHANGE Right 09/18/2018   Procedure: IRRIGATION AND DEBRIDEMENT KNEE WITH POLY EXCHANGE;  Surgeon: Lovell Sheehan, MD;  Location: [facility];  Service: Orthopedics;  Laterality: Right;   KNEE ARTHROSCOPY Right    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   TOTAL KNEE ARTHROPLASTY Left 09/17/2018   Procedure: I &D left knee and polyethylene exchange;  Surgeon: Lovell Sheehan, MD;  Location: [facility];  Service: Orthopedics;  Laterality: Left;   TRANSURETHRAL RESECTION OF PROSTATE N/A 09/28/2021   Procedure: TRANSURETHRAL RESECTION OF THE PROSTATE (TURP);  Surgeon: [name], MD;  Location: [facility];  Service: Urology;  Laterality: N/A;   TRANSURETHRAL RESECTION OF PROSTATE  09/28/2021   VEIN SURGERY Right     Family History  Problem Relation Age of Onset   Hypertension Mother    Diabetes Mother    Diabetes Father    Hypertension Father    Cancer Father    Seizures Daughter    Lupus Daughter    Cancer Maternal Grandfather        Prostate   Diabetes Paternal Grandfather    Prostate cancer Maternal Uncle    Bladder Cancer Neg Hx    Kidney cancer Neg Hx     Social History   Tobacco Use   Smoking status: Former    Packs/day: 1.00    Years: 10.00    Total pack years: 10.00    Types: Cigarettes    Start date: 04/20/1995    Quit date: 04/19/2005    Years since quitting: 17.1   Smokeless tobacco: Never  Substance Use Topics   Alcohol use: Yes    Alcohol/week: 10.0 standard drinks of alcohol    Types: 10 Cans of beer per week    Comment: occasional     Current Outpatient Medications:    acetaminophen (TYLENOL) 500 MG tablet, Take 1 tablet (500 mg total) by mouth every 6 (six) hours as needed., Disp: 90 tablet, Rfl: 0   acetic acid-hydrocortisone (VOSOL-HC) OTIC solution, Place 3 drops into both ears 2 (two) times daily., Disp: 10 mL, Rfl: 0   acidophilus (RISAQUAD) CAPS capsule, Take 1 capsule by mouth daily., Disp: 30 capsule, Rfl: 0    albuterol (VENTOLIN HFA) 108 (90 Base) MCG/ACT inhaler, INHALE 2 PUFFS BY MOUTH EVERY 6 HOURS AS NEEDED, Disp: 8.5 each, Rfl: 0   Azelastine-Fluticasone 137-50 MCG/ACT SUSP, Place 1 spray into the nose every 12 (twelve) hours., Disp: 23 g, Rfl: 2   BREO ELLIPTA 100-25 MCG/ACT AEPB, TAKE 1 PUFF BY MOUTH EVERY DAY, Disp: 180 each, Rfl: 1   citalopram (CELEXA) 20 MG tablet, Take 1 tablet (20 mg total) by mouth daily., Disp: 90 tablet, Rfl: 1   diclofenac Sodium (VOLTAREN) 1 % GEL, Apply 4 g topically 4 (four) times daily. (Patient taking differently: Apply 4 g topically as needed.), Disp: 300 g,  Rfl: 1   famotidine (PEPCID) 20 MG tablet, Take 20 mg by mouth 2 (two) times daily., Disp: , Rfl:    fludrocortisone (FLORINEF) 0.1 MG tablet, Take 1 tablet (0.1 mg total) by mouth daily., Disp: 60 tablet, Rfl: 0   hydrocortisone (CORTEF) 20 MG tablet, Take 0.5-1 tablets (10-20 mg total) by mouth 2 (two) times daily. 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   ROS  Constitutional: Negative for fever or weight change.  Respiratory: Negative for cough  and shortness of breath.   Cardiovascular: Negative for chest pain or palpitations.  Gastrointestinal: Negative for abdominal pain, no bowel changes.  Musculoskeletal: Negative for gait problem or joint swelling.  Skin: Negative for rash.  Neurological: Negative for dizziness or headache.  No other specific complaints in a complete review of systems (except as listed in HPI above).   Objective  Vitals:   06/22/22 0917  BP: 118/72  Pulse: 98  Resp: 18  Temp: 97.9 F (36.6 C)  TempSrc: Oral  SpO2: 100%  Weight: (!) 334 lb 8 oz (151.7 kg)  Height: '6\' 4"'$  (1.93 m)    Body mass index is 40.72 kg/m.  Physical Exam  Constitutional: Patient appears well-developed and well-nourished. Obese  No distress.  HEENT: head atraumatic, normocephalic, pupils equal and reactive to light, neck supple Cardiovascular: Normal rate, regular rhythm and normal heart sounds.  No murmur heard. No BLE edema. Pulmonary/Chest: Effort normal and breath sounds normal. No respiratory distress. Abdominal: Soft.  There is no tenderness. Psychiatric: Patient has a normal mood and affect. behavior is normal. Judgment and thought content normal.   Recent Results (from the past 2160 hour(s))  PSA     Status: None   Collection Time: 05/21/22  8:22 AM  Result Value Ref Range   Prostate Specific Ag, Serum 1.6 0.0 - 4.0 ng/mL    Comment: Roche ECLIA methodology. According to the American Urological Association, Serum PSA should decrease and remain at undetectable levels after radical prostatectomy. The AUA defines biochemical recurrence as an initial PSA value 0.2 ng/mL or greater followed by a subsequent confirmatory PSA value 0.2 ng/mL or greater. Values obtained with different assay methods or kits cannot be used interchangeably. Results cannot be interpreted as absolute evidence of the presence or absence of malignant disease.   Bladder Scan (Post Void Residual) in office     Status: None   Collection  Time: 05/25/22  9:21 AM  Result Value Ref Range   Scan Result 1 ml   POCT HgB A1C  Status: Abnormal   Collection Time: 06/22/22  9:19 AM  Result Value Ref Range   Hemoglobin A1C 8.5 (A) 4.0 - 5.6 %   HbA1c POC (<> result, manual entry)     HbA1c, POC (prediabetic range)     HbA1c, POC (controlled diabetic range)      PHQ2/9:    06/22/2022    9:19 AM 03/23/2022    7:40 AM 12/22/2021    7:53 AM 08/18/2021    7:56 AM 04/16/2021   10:48 AM  Depression screen PHQ 2/9  Decreased Interest 0 0 0 0 0  Down, Depressed, Hopeless 0 0 0 0 0  PHQ - 2 Score 0 0 0 0 0  Altered sleeping 0 0 0 0 0  Tired, decreased energy 0 0 0 0 0  Change in appetite 0 0 0 0 0  Feeling bad or failure about yourself  0 0 0  0  Trouble concentrating 0 0 0 0 0  Moving slowly or fidgety/restless 0 0 0 0 0  Suicidal thoughts 0 0 0 0 0  PHQ-9 Score 0 0 0 0 0  Difficult doing work/chores    Not difficult at all     phq 9 is negative   Fall Risk:    06/22/2022    9:19 AM 03/23/2022    7:39 AM 12/22/2021    7:53 AM 08/18/2021    7:55 AM 04/16/2021   10:48 AM  Fall Risk   Falls in the past year? 0 0 0 0 0  Number falls in past yr:  0 0 0 0  Injury with Fall?  0 0 0 0  Risk for fall due to : No Fall Risks No Fall Risks No Fall Risks  No Fall Risks  Follow up Falls prevention discussed Falls prevention discussed Falls prevention discussed  Falls prevention discussed      Functional Status Survey: Is the patient deaf or have difficulty hearing?: No Does the patient have difficulty seeing, even when wearing glasses/contacts?: No Does the patient have difficulty concentrating, remembering, or making decisions?: No Does the patient have difficulty walking or climbing stairs?: No Does the patient have difficulty dressing or bathing?: No Does the patient have difficulty doing errands alone such as visiting a doctor's office or shopping?: No    Assessment & Plan  1. Adrenal insufficiency ([place])  Keep follow up  with Endo  2. Type 2 diabetes mellitus with other diabetic kidney complication (H[facility])  - POCT HgB A1C  3. Chronic obstructive pulmonary disease, unspecified COPD type ([facility]CC)  - fluticasone furoate-vilanterol (BREO ELLIPTA) 100-25 MCG/ACT AEPB; Inhale 1 puff into the lungs daily.  Dispense: 180 each; Refill: 1  4. Atherosclerosis of aorta (HCC)  Continue statin therapy   5. OSA and COPD overlap syndrome ([facility])  compliant  6. Benign hypertension  - lisinopril (ZESTRIL) 20 MG tablet; Take 1 tablet (20 mg total) by mouth daily.  Dispense: 90 tablet; Refill: 1  7. Dyslipidemia  - rosuvastatin (CRESTOR) 20 MG tablet; Take 1 tablet (20 mg total) by mouth daily.  Dispense: 90 tablet; Refill: 1  8. Perennial allergic rhinitis with seasonal variation   9. Depression with anxiety  - citalopram (CELEXA) 20 MG tablet; Take 1 tablet (20 mg total) by mouth daily.  Dispense: 90 tablet; Refill: 1  10. Asthma, mild intermittent, well-controlled

## 2022-06-22 ENCOUNTER — Encounter: Payer: Self-pay | Admitting: Family Medicine

## 2022-06-22 ENCOUNTER — Ambulatory Visit: Payer: BC Managed Care – PPO | Admitting: Family Medicine

## 2022-06-22 VITALS — BP 118/72 | HR 98 | Temp 97.9°F | Resp 18 | Ht 76.0 in | Wt 334.5 lb

## 2022-06-22 DIAGNOSIS — E2740 Unspecified adrenocortical insufficiency: Secondary | ICD-10-CM

## 2022-06-22 DIAGNOSIS — I10 Essential (primary) hypertension: Secondary | ICD-10-CM

## 2022-06-22 DIAGNOSIS — Z803 Family history of malignant neoplasm of breast: Secondary | ICD-10-CM

## 2022-06-22 DIAGNOSIS — I700 Atherosclerosis of aorta: Secondary | ICD-10-CM

## 2022-06-22 DIAGNOSIS — E1129 Type 2 diabetes mellitus with other diabetic kidney complication: Secondary | ICD-10-CM

## 2022-06-22 DIAGNOSIS — J449 Chronic obstructive pulmonary disease, unspecified: Secondary | ICD-10-CM

## 2022-06-22 DIAGNOSIS — J3089 Other allergic rhinitis: Secondary | ICD-10-CM

## 2022-06-22 DIAGNOSIS — J4520 Mild intermittent asthma, uncomplicated: Secondary | ICD-10-CM

## 2022-06-22 DIAGNOSIS — G4733 Obstructive sleep apnea (adult) (pediatric): Secondary | ICD-10-CM

## 2022-06-22 DIAGNOSIS — E785 Hyperlipidemia, unspecified: Secondary | ICD-10-CM

## 2022-06-22 DIAGNOSIS — J302 Other seasonal allergic rhinitis: Secondary | ICD-10-CM | POA: Insufficient documentation

## 2022-06-22 DIAGNOSIS — F418 Other specified anxiety disorders: Secondary | ICD-10-CM

## 2022-06-22 LAB — POCT GLYCOSYLATED HEMOGLOBIN (HGB A1C): Hemoglobin A1C: 8.5 % — AB (ref 4.0–5.6)

## 2022-06-22 MED ORDER — FLUTICASONE FUROATE-VILANTEROL 100-25 MCG/ACT IN AEPB: 1.0000 | INHALATION_SPRAY | Freq: Every day | RESPIRATORY_TRACT | 1 refills | Status: DC

## 2022-06-22 MED ORDER — DAPAGLIFLOZIN PRO-METFORMIN ER 5-1000 MG PO TB24: 2.0000 | ORAL_TABLET | Freq: Every day | ORAL | 0 refills | Status: DC

## 2022-06-22 MED ORDER — ROSUVASTATIN CALCIUM 20 MG PO TABS: 20.0000 mg | ORAL_TABLET | Freq: Every day | ORAL | 1 refills | Status: DC

## 2022-06-22 MED ORDER — CITALOPRAM HYDROBROMIDE 20 MG PO TABS: 20.0000 mg | ORAL_TABLET | Freq: Every day | ORAL | 1 refills | Status: DC

## 2022-06-22 MED ORDER — LISINOPRIL 20 MG PO TABS: 20.0000 mg | ORAL_TABLET | Freq: Every day | ORAL | 1 refills | Status: DC

## 2022-07-09 ENCOUNTER — Encounter: Payer: Self-pay | Admitting: Family Medicine

## 2022-07-21 ENCOUNTER — Other Ambulatory Visit: Payer: Self-pay | Admitting: Family Medicine

## 2022-07-21 DIAGNOSIS — E1129 Type 2 diabetes mellitus with other diabetic kidney complication: Secondary | ICD-10-CM

## 2022-08-13 ENCOUNTER — Other Ambulatory Visit: Payer: Self-pay | Admitting: Family Medicine

## 2022-09-30 NOTE — Progress Notes (Signed)
Name: Isaac Cross   MRN: [id number]    DOB: 20-Jan-1961   Date:10/01/2022       Progress Note  Subjective  Chief Complaint  Follow Up  HPI  S/P bilateral total knee Replacements:  It was done by  Dr. Odis Luster on May 15th, 2020 in [place]. He continues to do well. No pain or swelling    COPD with OSA and asthma mild intermittent usually moderate but currently under control with medication -Breo prn  currently no cough , wheezing or SOB . Continue current management   AR: he is taking Xyzal and using nasal spray prn. Stable   OSA/COPD overlaping syndrome: he wears it every night CPAP machine, no snoring , wakes up feeling well    DMII:   A1C went from 6.4 % to 6.9 % it went up 7.4 %  last visit it was up to 8.5 % Xigduo 08/998 two daily , we increased dose last visit, he is also eating healthier and A1C is down to 7.7 %, he states fasting is down to around 140 to 150   He feels hungry all the time but no  polydipsia or polyuria  He has associated dyslipidemia, obesity , HTN and microalbuminuria. We will add Actos today    HTN: bP is at goal no chest pain, palpitation or sob    Atherosclerosis  aorta: he is now on higher dose of Rosuvastatin at 20 mg , LDL at goal now at 61    Adrenal insufficiency/Adrenal mass bilaterally : size of adrenal glands improved still taking medication given by Dr. Gershon Crane , it happened while hospitalized and masses likely from subdural hematoma and he is doing well on medications Unchanged    Depression/anxiety:  Phq 9 is negative. He states symptoms are controlled and wants to continue taking Celexa . Continue current regiment   LUTS: he states symptoms started after hospitalization and ICU stay back in 2020, he was seen by Urologist and had TURP 09/2021, he is feeling better . Seen by urologist recently and released from their care . We will recheck PSA today   Family history of breast cancer: mother at age 26 and maternal uncle around age 74, mother has a  recurrence , he will ask her if she was genetically tested   New lung nodule: he is going back in August for a repeat CT He quit smoking many years ago   Patient Active Problem List   Diagnosis Date Noted   Perennial allergic rhinitis with seasonal variation 06/22/2022   Adrenal insufficiency ([facility]) 03/23/2022   Microalbuminuria 03/23/2022   History of total knee arthroplasty 09/04/2018   Coronary atherosclerosis due to calcified coronary lesion 12/02/2017   Atherosclerosis of aorta ([facility]) 06/17/2015   Fatty liver disease, nonalcoholic 06/17/2015   Renal cyst, right 06/17/2015   BPH (benign prostatic hyperplasia) 05/25/2015   Microscopic hematuria 05/25/2015   Second degree hemorrhoids    Diverticulosis of large intestine without diverticulitis    Asthma, mild intermittent, well-controlled 10/16/2014   Allergic rhinitis 10/13/2014   ED (erectile dysfunction) of organic origin 07/22/2014   Gastro-esophageal reflux disease without esophagitis 07/22/2014   Benign hypertension 07/22/2014   Type 2 diabetes mellitus with other diabetic kidney complication (HCC) 07/22/2014   Adult BMI 30+ 07/22/2014   Chronic obstructive pulmonary disease (HCC) 07/22/2014   Depression with anxiety 07/22/2014   Dyslipidemia 07/22/2014   Genital herpes 07/22/2014   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   Osteoarthritis of both knees 07/22/2014  OSA and COPD overlap syndrome ([facility]CC) 05/14/2013   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   Blood in the urine 09/20/2006    Past Surgical History:  Procedure Laterality Date   BLADDER REPAIR     COLONOSCOPY WITH PROPOFOL N/A 04/07/2015   Procedure: COLONOSCOPY WITH PROPOFOL;  Surgeon: Midge Minium, MD;  Location: [facility];  Service: Endoscopy;  Laterality: N/A;  Diabetic - oral meds CPAP   HAND SURGERY Left    4TH AND 5TH FINGER   I & D KNEE WITH POLY EXCHANGE Right 09/18/2018   Procedure: IRRIGATION AND DEBRIDEMENT KNEE WITH POLY EXCHANGE;   Surgeon: Lyndle Herrlich, MD;  Location: [facility];  Service: Orthopedics;  Laterality: Right;   KNEE ARTHROSCOPY Right    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   TOTAL KNEE ARTHROPLASTY Left 09/17/2018   Procedure: I &D left knee and polyethylene exchange;  Surgeon: Lyndle Herrlich, MD;  Location: [facility];  Service: Orthopedics;  Laterality: Left;   TRANSURETHRAL RESECTION OF PROSTATE N/A 09/28/2021   Procedure: TRANSURETHRAL RESECTION OF THE PROSTATE (TURP);  Surgeon: Vanna Scotland, MD;  Location: [facility];  Service: Urology;  Laterality: N/A;   TRANSURETHRAL RESECTION OF PROSTATE  09/28/2021   VEIN SURGERY Right     Family History  Problem Relation Age of Onset   Cancer Mother 77       breast   Hypertension Mother    Diabetes Mother    Cancer Father        skin cancer   Diabetes Father    Hypertension Father    Cancer Maternal Grandfather        Prostate   Diabetes Paternal Grandfather    Seizures Daughter    Lupus Daughter    Cancer Maternal Uncle        breast cancer   Cancer Maternal Uncle 100       prostate   Cancer Maternal Uncle 76       lung   Bladder Cancer Neg Hx    Kidney cancer Neg Hx     Social History   Tobacco Use   Smoking status: Former    Packs/day: 1.00    Years: 10.00    Additional pack years: 0.00    Total pack years: 10.00    Types: Cigarettes    Start date: 04/20/1995    Quit date: 04/19/2005    Years since quitting: 17.4   Smokeless tobacco: Never  Substance Use Topics   Alcohol use: Yes    Alcohol/week: 10.0 standard drinks of alcohol    Types: 10 Cans of beer per week    Comment: occasional     Current Outpatient Medications:    acetaminophen (TYLENOL) 500 MG tablet, Take 1 tablet (500 mg total) by mouth every 6 (six) hours as needed., Disp: 90 tablet, Rfl: 0   acetic acid-hydrocortisone (VOSOL-HC) OTIC solution, Place 3 drops into both ears 2 (two) times daily., Disp: 10 mL, Rfl: 0   acidophilus (RISAQUAD) CAPS  capsule, Take 1 capsule by mouth daily., Disp: 30 capsule, Rfl: 0   albuterol (VENTOLIN HFA) 108 (90 Base) MCG/ACT inhaler, INHALE 2 PUFFS BY MOUTH EVERY 6 HOURS AS NEEDED, Disp: 8.5 each, Rfl: 0   Azelastine-Fluticasone 137-50 MCG/ACT SUSP, Place 1 spray into the nose every 12 (twelve) hours., Disp: 23 g, Rfl: 2   citalopram (CELEXA) 20 MG tablet, Take 1 tablet (20 mg total) by mouth daily., Disp: 90 tablet, Rfl: 1   Dapagliflozin Pro-metFORMIN ER (XIGDUO  XR) 08-998 MG TB24, Take 2 tablets by mouth daily., Disp: 180 tablet, Rfl: 0   diclofenac Sodium (VOLTAREN) 1 % GEL, Apply 4 g topically 4 (four) times daily. (Patient taking differently: Apply 4 g topically as needed.), Disp: 300 g, Rfl: 1   famotidine (PEPCID) 20 MG tablet, Take 20 mg by mouth 2 (two) times daily., Disp: , Rfl:    fludrocortisone (FLORINEF) 0.1 MG tablet, Take 1 tablet (0.1 mg total) by mouth daily., Disp: 60 tablet, Rfl: 0   fluticasone furoate-vilanterol (BREO ELLIPTA) 100-25 MCG/ACT AEPB, Inhale 1 puff into the lungs daily., Disp: 180 each, Rfl: 1   hydrocortisone (CORTEF) 20 MG tablet, Take 0.5-1 tablets (10-20 mg total) by mouth 2 (two) times daily. 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   ROS  Ten systems reviewed and is negative except as mentioned in HPI   Objective  Vitals:   10/01/22 0743  BP: 122/84  Pulse: 94  Resp: 16  SpO2: 99%  Weight: (!) 333 lb (151 kg)  Height: 6\' 4"  (1.93 m)    Body mass index is 40.53 kg/m.  Physical Exam  Constitutional: Patient appears well-developed and well-nourished. Obese  No distress.  HEENT: head atraumatic, normocephalic, pupils equal and reactive to light, neck supple Cardiovascular: Normal rate, regular rhythm and normal heart sounds.  No murmur heard. No BLE edema. Pulmonary/Chest: Effort normal and breath sounds normal. No respiratory distress. Abdominal: Soft.  There is no tenderness. Psychiatric: Patient has a normal mood and affect. behavior is normal. Judgment and thought content normal.   Recent Results (from the past 2160 hour(s))  POCT HgB A1C     Status: Abnormal   Collection Time: 10/01/22  7:47 AM  Result Value Ref Range   Hemoglobin A1C 7.7 (A) 4.0 - 5.6 %   HbA1c POC (<> result, manual entry)     HbA1c, POC (prediabetic range)     HbA1c, POC (controlled diabetic range)       PHQ2/9:    10/01/2022    7:43 AM 06/22/2022    9:19 AM 03/23/2022    7:40 AM 12/22/2021    7:53 AM 08/18/2021    7:56 AM  Depression screen PHQ 2/9  Decreased Interest 0 0 0 0 0  Down, Depressed, Hopeless 0 0 0 0 0  PHQ - 2 Score 0 0 0 0 0  Altered sleeping 0 0 0 0 0  Tired, decreased energy 0 0 0 0 0  Change in appetite 0 0 0 0 0  Feeling bad or failure about yourself  0 0 0 0   Trouble concentrating 0 0 0 0 0  Moving slowly or fidgety/restless 0 0 0 0 0  Suicidal thoughts 0 0 0 0 0  PHQ-9 Score 0 0 0 0 0  Difficult doing work/chores     Not difficult at all    phq 9 is negative   Fall Risk:    10/01/2022    7:43 AM 06/22/2022    9:19 AM 03/23/2022    7:39 AM 12/22/2021    7:53 AM 08/18/2021     7:55 AM  Fall Risk   Falls in the past year? 0 0 0 0 0  Number falls in past yr: 0  0 0 0  Injury with Fall? 0  0 0 0  Risk for fall due to : No Fall Risks No Fall Risks No Fall Risks No Fall Risks   Follow up Falls prevention discussed Falls prevention discussed Falls prevention discussed Falls prevention discussed       Functional Status Survey: Is the patient deaf or have difficulty hearing?: No Does the patient have difficulty seeing, even when wearing glasses/contacts?: No Does the patient have difficulty concentrating, remembering, or making decisions?: No Does the patient have difficulty walking or climbing stairs?: No Does the patient have difficulty dressing or bathing?: No Does the patient have difficulty doing errands alone such as visiting a doctor's office or shopping?: No    Assessment & Plan  1. Type 2 diabetes mellitus with other diabetic kidney complication (HCC)  - POCT HgB A1C - Urine Microalbumin w/creat. ratio - Dapagliflozin Pro-metFORMIN ER (XIGDUO XR) 08-998 MG TB24; Take 2 tablets by mouth daily.  Dispense: 180 tablet; Refill: 0 - pioglitazone (ACTOS) 15 MG tablet; Take 1 tablet (15 mg total) by mouth daily.  Dispense: 90 tablet; Refill: 0  2. Atherosclerosis of aorta (HCC)  On statin therapy   3. Chronic obstructive pulmonary disease, unspecified COPD type (HCC)  Doing well at this time   4. OSA and COPD overlap syndrome (HCC)   5. Adrenal insufficiency (HCC)  Continue yearly follow up with Endo   6. Benign hypertension   7. Benign prostatic hyperplasia with nocturia  - PSA

## 2022-10-01 ENCOUNTER — Encounter: Payer: Self-pay | Admitting: Family Medicine

## 2022-10-01 ENCOUNTER — Ambulatory Visit: Payer: BC Managed Care – PPO | Admitting: Family Medicine

## 2022-10-01 VITALS — BP 122/84 | HR 94 | Resp 16 | Ht 76.0 in | Wt 333.0 lb

## 2022-10-01 DIAGNOSIS — J449 Chronic obstructive pulmonary disease, unspecified: Secondary | ICD-10-CM

## 2022-10-01 DIAGNOSIS — I700 Atherosclerosis of aorta: Secondary | ICD-10-CM | POA: Diagnosis not present

## 2022-10-01 DIAGNOSIS — E2740 Unspecified adrenocortical insufficiency: Secondary | ICD-10-CM

## 2022-10-01 DIAGNOSIS — E1129 Type 2 diabetes mellitus with other diabetic kidney complication: Secondary | ICD-10-CM

## 2022-10-01 DIAGNOSIS — N401 Enlarged prostate with lower urinary tract symptoms: Secondary | ICD-10-CM

## 2022-10-01 DIAGNOSIS — R351 Nocturia: Secondary | ICD-10-CM

## 2022-10-01 DIAGNOSIS — G4733 Obstructive sleep apnea (adult) (pediatric): Secondary | ICD-10-CM

## 2022-10-01 DIAGNOSIS — I10 Essential (primary) hypertension: Secondary | ICD-10-CM

## 2022-10-01 DIAGNOSIS — Z7984 Long term (current) use of oral hypoglycemic drugs: Secondary | ICD-10-CM | POA: Diagnosis not present

## 2022-10-01 LAB — POCT GLYCOSYLATED HEMOGLOBIN (HGB A1C): Hemoglobin A1C: 7.7 % — AB (ref 4.0–5.6)

## 2022-10-01 MED ORDER — DAPAGLIFLOZIN PRO-METFORMIN ER 5-1000 MG PO TB24: 2.0000 | ORAL_TABLET | Freq: Every day | ORAL | 0 refills | Status: DC

## 2022-10-01 MED ORDER — PIOGLITAZONE HCL 15 MG PO TABS
15.0000 mg | ORAL_TABLET | Freq: Every day | ORAL | 0 refills | Status: DC
Start: 2022-10-01 — End: 2022-12-27

## 2022-10-02 LAB — MICROALBUMIN / CREATININE URINE RATIO
Creatinine, Urine: 102 mg/dL (ref 20–320)
Microalb Creat Ratio: 35 mg/g creat — ABNORMAL HIGH (ref <30)
Microalb, Ur: 3.6 mg/dL

## 2022-10-02 LAB — PSA: PSA: 1.76 ng/mL (ref <=4.00)

## 2022-12-13 ENCOUNTER — Ambulatory Visit: Payer: BC Managed Care – PPO

## 2022-12-27 ENCOUNTER — Other Ambulatory Visit: Payer: Self-pay | Admitting: Family Medicine

## 2022-12-27 DIAGNOSIS — E1129 Type 2 diabetes mellitus with other diabetic kidney complication: Secondary | ICD-10-CM

## 2022-12-27 LAB — HM DIABETES EYE EXAM

## 2022-12-29 NOTE — Progress Notes (Unsigned)
Name: Isaac Cross   MRN: [id number]    DOB: 1960-05-29   Date:12/30/2022       Progress Note  Subjective  Chief Complaint  Follow Up  HPI  S/P bilateral total knee Replacements:  It was done by  Dr. Odis Luster on May 15th, 2020 in [place]. He continues to do well.  Currently no knee pain, but has hip pains when uses handicap sticker prn   AR: he is taking Xyzal and using nasal spray prn. Stable   OSA/COPD overlaping syndrome: he wears it every night CPAP machine, no snoring , wakes up feeling well . He also has emphysema on CT chest . He is still using Breo but currently only prn   DMII:  A1C has been above 7 % for one year now, he is currently taking Xigduo 08/998 mg two daily and started on Actos 15 mg three months ago. A1C is still above goal at 7.6 % . He  agrees on starting Rybelsus today. Fasting levels still around 150   He feels hungry all the time but no polydipsia or polyuria  He has associated dyslipidemia, obesity , HTN and microalbuminuria. Discussed possible side effects of Rybelsus, negative history of pancreatitis or family history of thyroid cancer    HTN: BP is at goal no chest pain, palpitation or sob    Atherosclerosis  aorta: he is now on higher dose of Rosuvastatin at 20 mg , LDL at goal now at 61 , continue current regiment    Adrenal insufficiency/Adrenal mass bilaterally : size of adrenal glands improved still taking medication given by Dr. Gershon Crane , it happened while hospitalized and masses likely from subdural hematoma and he is doing well on medications He is aware of the importance of notifying him when sick    Depression/anxiety:  Phq 9 is negative. He states symptoms are controlled and wants to continue taking Celexa . Stable   LUTS: he states symptoms started after hospitalization and ICU stay back in 2020, he was seen by Urologist and had TURP 09/2021, he is feeling better . Seen by urologist recently and released from their care .Last PSA is trending up and  we will recheck in Dec and if still trending up we will refer him back   Family history of breast cancer: mother at age 88 and maternal uncle around age 23, mother has a recurrence. He states uncle was genetically tested and negative work up   New lung nodule: he ws  going back in August  2024 for a repeat CT but it was too costly, he is waiting to get medicare next year   Patient Active Problem List   Diagnosis Date Noted   Perennial allergic rhinitis with seasonal variation 06/22/2022   Adrenal insufficiency (HCC) 03/23/2022   Microalbuminuria 03/23/2022   History of total knee arthroplasty 09/04/2018   Coronary atherosclerosis due to calcified coronary lesion 12/02/2017   Atherosclerosis of aorta (HCC) 06/17/2015   Fatty liver disease, nonalcoholic 06/17/2015   Renal cyst, right 06/17/2015   BPH (benign prostatic hyperplasia) 05/25/2015   Microscopic hematuria 05/25/2015   Second degree hemorrhoids    Diverticulosis of large intestine without diverticulitis    Asthma, mild intermittent, well-controlled 10/16/2014   Allergic rhinitis 10/13/2014   ED (erectile dysfunction) of organic origin 07/22/2014   Gastro-esophageal reflux disease without esophagitis 07/22/2014   Benign hypertension 07/22/2014   Type 2 diabetes mellitus with other diabetic kidney complication (HCC) 07/22/2014   Adult BMI 30+ 07/22/2014  Chronic obstructive pulmonary disease (HCC) 07/22/2014   Depression with anxiety 07/22/2014   Dyslipidemia 07/22/2014   Genital herpes 07/22/2014   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   Osteoarthritis of both knees 07/22/2014   OSA and COPD overlap syndrome (HCC) 05/14/2013   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   Blood in the urine 09/20/2006    Past Surgical History:  Procedure Laterality Date   BLADDER REPAIR     COLONOSCOPY WITH PROPOFOL N/A 04/07/2015   Procedure: COLONOSCOPY WITH PROPOFOL;  Surgeon: Midge Minium, MD;  Location: [facility];  Service:  Endoscopy;  Laterality: N/A;  Diabetic - oral meds CPAP   HAND SURGERY Left    4TH AND 5TH FINGER   I & D KNEE WITH POLY EXCHANGE Right 09/18/2018   Procedure: IRRIGATION AND DEBRIDEMENT KNEE WITH POLY EXCHANGE;  Surgeon: Lyndle Herrlich, MD;  Location: [facility];  Service: Orthopedics;  Laterality: Right;   KNEE ARTHROSCOPY Right    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   TOTAL KNEE ARTHROPLASTY Left 09/17/2018   Procedure: I &D left knee and polyethylene exchange;  Surgeon: Lyndle Herrlich, MD;  Location: [facility];  Service: Orthopedics;  Laterality: Left;   TRANSURETHRAL RESECTION OF PROSTATE N/A 09/28/2021   Procedure: TRANSURETHRAL RESECTION OF THE PROSTATE (TURP);  Surgeon: Vanna Scotland, MD;  Location: [facility];  Service: Urology;  Laterality: N/A;   TRANSURETHRAL RESECTION OF PROSTATE  09/28/2021   VEIN SURGERY Right     Family History  Problem Relation Age of Onset   Cancer Mother 32       breast   Hypertension Mother    Diabetes Mother    Cancer Father        skin cancer   Diabetes Father    Hypertension Father    Cancer Maternal Grandfather        Prostate   Diabetes Paternal Grandfather    Seizures Daughter    Lupus Daughter    Cancer Maternal Uncle        breast cancer   Cancer Maternal Uncle 39       prostate   Cancer Maternal Uncle 82       lung   Bladder Cancer Neg Hx    Kidney cancer Neg Hx     Social History   Tobacco Use   Smoking status: Former    Current packs/day: 0.00    Average packs/day: 1 pack/day for 10.0 years (10.0 ttl pk-yrs)    Types: Cigarettes    Start date: 04/20/1995    Quit date: 04/19/2005    Years since quitting: 17.7   Smokeless tobacco: Never  Substance Use Topics   Alcohol use: Yes    Alcohol/week: 10.0 standard drinks of alcohol    Types: 10 Cans of beer per week    Comment: occasional     Current Outpatient Medications:    acetaminophen (TYLENOL) 500 MG tablet, Take 1 tablet (500 mg total) by mouth  every 6 (six) hours as needed., Disp: 90 tablet, Rfl: 0   acetic acid-hydrocortisone (VOSOL-HC) OTIC solution, Place 3 drops into both ears 2 (two) times daily., Disp: 10 mL, Rfl: 0   acidophilus (RISAQUAD) CAPS capsule, Take 1 capsule by mouth daily., Disp: 30 capsule, Rfl: 0   albuterol (VENTOLIN HFA) 108 (90 Base) MCG/ACT inhaler, INHALE 2 PUFFS BY MOUTH EVERY 6 HOURS AS NEEDED, Disp: 8.5 each, Rfl: 0   Azelastine-Fluticasone 137-50 MCG/ACT SUSP, Place 1 spray into the nose every 12 (twelve) hours.,  Disp: 23 g, Rfl: 2   diclofenac Sodium (VOLTAREN) 1 % GEL, Apply 4 g topically 4 (four) times daily. (Patient taking differently: Apply 4 g topically as needed.), Disp: 300 g, Rfl: 1   famotidine (PEPCID) 20 MG tablet, Take 20 mg by mouth 2 (two) times daily., Disp: , Rfl:    fludrocortisone (FLORINEF) 0.1 MG tablet, Take 1 tablet (0.1 mg total) by mouth daily., Disp: 60 tablet, Rfl: 0   fluticasone furoate-vilanterol (BREO ELLIPTA) 100-25 MCG/ACT AEPB, Inhale 1 puff into the lungs daily., Disp: 180 each, Rfl: 1   hydrocortisone (CORTEF) 20 MG tablet, Take 0.5-1 tablets (10-20 mg total) by mouth 2 (two) times daily. 1 in am and half at night, Disp: 60 tablet, Rfl: 0   ketoconazole (NIZORAL) 2 % cream, Apply 1 Application topically daily., Disp: 60 g, Rfl: 1   levocetirizine (XYZAL) 5 MG tablet, Take 1 tablet (5 mg total) by mouth every evening., Disp: 90 tablet, Rfl: 1   Multiple Vitamins-Minerals (MENS MULTIVITAMIN PLUS) TABS, Take by mouth., Disp: , Rfl:    Omega-3 Fatty Acids (FISH OIL) 1000 MG CAPS, Take by mouth daily., Disp: , Rfl:    Semaglutide (RYBELSUS) 3 MG TABS, Take 1 tablet (3 mg total) by mouth daily., Disp: , Rfl:    Semaglutide (RYBELSUS) 7 MG TABS, Take 1 tablet (7 mg total) by mouth daily., Disp: 90 tablet, Rfl: 0   citalopram (CELEXA) 20 MG tablet, Take 1 tablet (20 mg total) by mouth daily., Disp: 90 tablet, Rfl: 1   Dapagliflozin Pro-metFORMIN ER (XIGDUO XR) 08-998 MG TB24, Take  2 tablets by mouth daily., Disp: 180 tablet, Rfl: 0   lisinopril (ZESTRIL) 20 MG tablet, Take 1 tablet (20 mg total) by mouth daily., Disp: 90 tablet, Rfl: 1   pioglitazone (ACTOS) 15 MG tablet, Take 1 tablet (15 mg total) by mouth daily., Disp: 90 tablet, Rfl: 0   rosuvastatin (CRESTOR) 20 MG tablet, Take 1 tablet (20 mg total) by mouth daily., Disp: 90 tablet, Rfl: 1  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   ROS  Ten systems reviewed and is negative except as mentioned in HPI    Objective  Vitals:   12/30/22 0759  BP: 124/72  Pulse: 100  Resp: 16  Temp: 98 F (36.7 C)  SpO2: 98%  Weight: (!) 344 lb (156 kg)  Height: 6\' 4"  (1.93 m)    Body mass index is 41.87 kg/m.  Physical Exam  Constitutional: Patient appears well-developed and well-nourished. Obese No distress.  HEENT: head atraumatic, normocephalic, pupils equal and reactive to light, neck supple Cardiovascular: Normal rate, regular rhythm and normal heart sounds.  No murmur heard. Trace BLE edema. Pulmonary/Chest: Effort normal and breath sounds normal. No respiratory distress. Abdominal: Soft.  There is no tenderness. Psychiatric: Patient has a normal mood and affect. behavior is normal. Judgment and thought content normal.    PHQ2/9:    12/30/2022    8:00 AM 10/01/2022    7:43 AM 06/22/2022    9:19 AM 03/23/2022    7:40 AM 12/22/2021    7:53 AM  Depression screen PHQ 2/9  Decreased Interest 0 0 0 0  0  Down, Depressed, Hopeless 0 0 0 0 0  PHQ - 2 Score 0 0 0 0 0  Altered sleeping 0 0 0 0 0  Tired, decreased energy 0 0 0 0 0  Change in appetite 0 0 0 0 0  Feeling bad or failure about yourself   0 0 0 0 0  Trouble concentrating 0 0 0 0 0  Moving slowly or fidgety/restless 0 0 0 0 0  Suicidal thoughts 0 0 0 0 0  PHQ-9 Score 0 0 0 0 0  Difficult doing work/chores Not difficult at all        phq 9 is negative   Fall Risk:    12/30/2022    8:00 AM 10/01/2022    7:43 AM 06/22/2022    9:19 AM 03/23/2022    7:39 AM 12/22/2021    7:53 AM  Fall Risk   Falls in the past year? 0 0 0 0 0  Number falls in past yr: 0 0  0 0  Injury with Fall? 0 0  0 0  Risk for fall due to :  No Fall Risks No Fall Risks No Fall Risks No Fall Risks  Follow up  Falls prevention discussed Falls prevention discussed Falls prevention discussed Falls prevention discussed    Assessment & Plan  1. 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HCC)  - POCT HgB A1C - HM Diabetes Foot Exam - Semaglutide (RYBELSUS) 7 MG TABS; Take 1 tablet (7 mg total) by mouth daily.  Dispense: 90 tablet; Refill: 0 Also samples of Rybelsus 3 mg   2. Atherosclerosis of aorta (HCC)  On statin therapy   3. Adrenal insufficiency (HCC)  Sees Endo   4. OSA and COPD overlap syndrome (HCC)  Continue CPAP and Breo   5. Perennial allergic rhinitis with seasonal variation  Taking Xyzal and symptoms are stable   6.Benign hypertension  - lisinopril (ZESTRIL) 20 MG tablet; Take 1 tablet (20 mg total) by mouth daily.  Dispense: 90 tablet; Refill: 1  7. Benign prostatic hyperplasia with nocturia  Recheck PSA next visit   8. Type 2 diabetes mellitus with other diabetic kidney complication (HCC)  - Dapagliflozin Pro-metFORMIN ER (XIGDUO XR) 08-998 MG TB24; Take 2 tablets by mouth daily.  Dispense: 180 tablet; Refill: 0 - pioglitazone (ACTOS) 15 MG tablet; Take 1 tablet (15 mg total) by mouth daily.  Dispense: 90 tablet; Refill: 0  9. Dyslipidemia  - rosuvastatin (CRESTOR) 20 MG tablet; Take 1 tablet (20 mg total) by mouth daily.  Dispense: 90 tablet; Refill: 1  10. Depression with anxiety  - citalopram (CELEXA) 20 MG  tablet; Take 1 tablet (20 mg total) by mouth daily.  Dispense: 90 tablet; Refill: 1  11. Intertrigo  - ketoconazole (NIZORAL) 2 % cream; Apply 1 Application topically daily.  Dispense: 60 g; Refill: 1  12. Needs flu shot  - Flu vaccine trivalent PF, 6mos and older(Flulaval,Afluria,Fluarix,Fluzone)

## 2022-12-30 ENCOUNTER — Ambulatory Visit: Payer: BC Managed Care – PPO | Admitting: Family Medicine

## 2022-12-30 ENCOUNTER — Encounter: Payer: Self-pay | Admitting: Family Medicine

## 2022-12-30 VITALS — BP 124/72 | HR 100 | Temp 98.0°F | Resp 16 | Ht 76.0 in | Wt 344.0 lb

## 2022-12-30 DIAGNOSIS — J302 Other seasonal allergic rhinitis: Secondary | ICD-10-CM

## 2022-12-30 DIAGNOSIS — Z23 Encounter for immunization: Secondary | ICD-10-CM | POA: Diagnosis not present

## 2022-12-30 DIAGNOSIS — R809 Proteinuria, unspecified: Secondary | ICD-10-CM | POA: Diagnosis not present

## 2022-12-30 DIAGNOSIS — E785 Hyperlipidemia, unspecified: Secondary | ICD-10-CM

## 2022-12-30 DIAGNOSIS — J449 Chronic obstructive pulmonary disease, unspecified: Secondary | ICD-10-CM

## 2022-12-30 DIAGNOSIS — L304 Erythema intertrigo: Secondary | ICD-10-CM

## 2022-12-30 DIAGNOSIS — I10 Essential (primary) hypertension: Secondary | ICD-10-CM

## 2022-12-30 DIAGNOSIS — E2740 Unspecified adrenocortical insufficiency: Secondary | ICD-10-CM | POA: Diagnosis not present

## 2022-12-30 DIAGNOSIS — E1129 Type 2 diabetes mellitus with other diabetic kidney complication: Secondary | ICD-10-CM | POA: Diagnosis not present

## 2022-12-30 DIAGNOSIS — I700 Atherosclerosis of aorta: Secondary | ICD-10-CM | POA: Diagnosis not present

## 2022-12-30 DIAGNOSIS — G4733 Obstructive sleep apnea (adult) (pediatric): Secondary | ICD-10-CM

## 2022-12-30 DIAGNOSIS — F418 Other specified anxiety disorders: Secondary | ICD-10-CM

## 2022-12-30 DIAGNOSIS — N401 Enlarged prostate with lower urinary tract symptoms: Secondary | ICD-10-CM

## 2022-12-30 DIAGNOSIS — J3089 Other allergic rhinitis: Secondary | ICD-10-CM

## 2022-12-30 LAB — POCT GLYCOSYLATED HEMOGLOBIN (HGB A1C): Hemoglobin A1C: 7.6 % — AB (ref 4.0–5.6)

## 2022-12-30 MED ORDER — PIOGLITAZONE HCL 15 MG PO TABS
15.0000 mg | ORAL_TABLET | Freq: Every day | ORAL | 0 refills | Status: DC
Start: 2022-12-30 — End: 2023-03-01

## 2022-12-30 MED ORDER — CITALOPRAM HYDROBROMIDE 20 MG PO TABS
20.0000 mg | ORAL_TABLET | Freq: Every day | ORAL | 1 refills | Status: DC
Start: 2022-12-30 — End: 2023-06-24

## 2022-12-30 MED ORDER — RYBELSUS 7 MG PO TABS: 7.0000 mg | ORAL_TABLET | Freq: Every day | ORAL | 0 refills | Status: DC

## 2022-12-30 MED ORDER — RYBELSUS 3 MG PO TABS
3.0000 mg | ORAL_TABLET | Freq: Every day | ORAL | Status: DC
Start: 2022-12-30 — End: 2023-03-01

## 2022-12-30 MED ORDER — KETOCONAZOLE 2 % EX CREA: 1.0000 | TOPICAL_CREAM | Freq: Every day | CUTANEOUS | 1 refills | Status: DC

## 2022-12-30 MED ORDER — LISINOPRIL 20 MG PO TABS
20.0000 mg | ORAL_TABLET | Freq: Every day | ORAL | 1 refills | Status: DC
Start: 2022-12-30 — End: 2023-04-19

## 2022-12-30 MED ORDER — DAPAGLIFLOZIN PRO-METFORMIN ER 5-1000 MG PO TB24
2.0000 | ORAL_TABLET | Freq: Every day | ORAL | 0 refills | Status: DC
Start: 2022-12-30 — End: 2023-03-01

## 2022-12-30 MED ORDER — ROSUVASTATIN CALCIUM 20 MG PO TABS
20.0000 mg | ORAL_TABLET | Freq: Every day | ORAL | 1 refills | Status: DC
Start: 2022-12-30 — End: 2023-06-24

## 2023-03-01 ENCOUNTER — Other Ambulatory Visit: Payer: Self-pay | Admitting: Family Medicine

## 2023-03-01 ENCOUNTER — Encounter: Payer: Self-pay | Admitting: Family Medicine

## 2023-03-01 DIAGNOSIS — E1129 Type 2 diabetes mellitus with other diabetic kidney complication: Secondary | ICD-10-CM

## 2023-03-01 MED ORDER — RYBELSUS 7 MG PO TABS: 7.0000 mg | ORAL_TABLET | Freq: Every day | ORAL | 0 refills | Status: DC

## 2023-03-01 MED ORDER — DAPAGLIFLOZIN PRO-METFORMIN ER 5-1000 MG PO TB24: 2.0000 | ORAL_TABLET | Freq: Every day | ORAL | 0 refills | Status: DC

## 2023-03-01 MED ORDER — PIOGLITAZONE HCL 15 MG PO TABS: 15.0000 mg | ORAL_TABLET | Freq: Every day | ORAL | 0 refills | Status: DC

## 2023-03-01 NOTE — Telephone Encounter (Signed)
Dose change.

## 2023-04-08 ENCOUNTER — Encounter: Payer: Self-pay | Admitting: Family Medicine

## 2023-04-08 NOTE — Telephone Encounter (Signed)
 Care team updated and letter sent for eye exam notes.

## 2023-04-19 ENCOUNTER — Encounter: Payer: Self-pay | Admitting: Family Medicine

## 2023-04-19 ENCOUNTER — Ambulatory Visit: Payer: BC Managed Care – PPO | Admitting: Family Medicine

## 2023-04-19 VITALS — BP 124/76 | HR 97 | Temp 98.7°F | Resp 16 | Ht 76.0 in | Wt 340.0 lb

## 2023-04-19 DIAGNOSIS — I700 Atherosclerosis of aorta: Secondary | ICD-10-CM

## 2023-04-19 DIAGNOSIS — G4733 Obstructive sleep apnea (adult) (pediatric): Secondary | ICD-10-CM | POA: Diagnosis not present

## 2023-04-19 DIAGNOSIS — R000 Tachycardia, unspecified: Secondary | ICD-10-CM

## 2023-04-19 DIAGNOSIS — J449 Chronic obstructive pulmonary disease, unspecified: Secondary | ICD-10-CM

## 2023-04-19 DIAGNOSIS — N6325 Unspecified lump in the left breast, overlapping quadrants: Secondary | ICD-10-CM

## 2023-04-19 DIAGNOSIS — I10 Essential (primary) hypertension: Secondary | ICD-10-CM

## 2023-04-19 DIAGNOSIS — E1129 Type 2 diabetes mellitus with other diabetic kidney complication: Secondary | ICD-10-CM | POA: Diagnosis not present

## 2023-04-19 DIAGNOSIS — R972 Elevated prostate specific antigen [PSA]: Secondary | ICD-10-CM

## 2023-04-19 DIAGNOSIS — E2740 Unspecified adrenocortical insufficiency: Secondary | ICD-10-CM

## 2023-04-19 DIAGNOSIS — N62 Hypertrophy of breast: Secondary | ICD-10-CM

## 2023-04-19 DIAGNOSIS — R809 Proteinuria, unspecified: Secondary | ICD-10-CM | POA: Diagnosis not present

## 2023-04-19 DIAGNOSIS — Z96653 Presence of artificial knee joint, bilateral: Secondary | ICD-10-CM

## 2023-04-19 DIAGNOSIS — J4489 Other specified chronic obstructive pulmonary disease: Secondary | ICD-10-CM

## 2023-04-19 LAB — POCT GLYCOSYLATED HEMOGLOBIN (HGB A1C): Hemoglobin A1C: 6.9 % — AB (ref 4.0–5.6)

## 2023-04-19 MED ORDER — LISINOPRIL 10 MG PO TABS: 10.0000 mg | ORAL_TABLET | Freq: Every day | ORAL | 0 refills | Status: DC

## 2023-04-19 MED ORDER — RYBELSUS 7 MG PO TABS: 7.0000 mg | ORAL_TABLET | Freq: Every day | ORAL | 0 refills | Status: DC

## 2023-04-19 MED ORDER — DAPAGLIFLOZIN PRO-METFORMIN ER 5-1000 MG PO TB24: 2.0000 | ORAL_TABLET | Freq: Every day | ORAL | 0 refills | Status: DC

## 2023-04-19 MED ORDER — PIOGLITAZONE HCL 15 MG PO TABS: 15.0000 mg | ORAL_TABLET | Freq: Every day | ORAL | 0 refills | Status: DC

## 2023-04-19 NOTE — Progress Notes (Signed)
 Name: Isaac Cross   MRN: [id number]    DOB: Jun 21, 1960   Date:04/19/2023       Progress Note  Subjective  Chief Complaint  Chief Complaint  Patient presents with   Medical Management of Chronic Issues    HPI  S/P bilateral total knee Replacements:  It was done by  Dr. Leora on May 15th, 2020 in [place]. He continues to do well.  Currently no knee pain, but has hip pains. Doing well    AR/Asthma/COPD: he is taking Xyzal  and using nasal spray prn. Under control . Denies cough or SOB   OSA/COPD overlaping syndrome: he wears it every night CPAP machine, no snoring , denies headaches when he wakes up He also has emphysema on CT chest . He is still using Breo every other day    DMII:  He has been on Xigduo  and Actos  for a while, A1C started to trend up, he finally agreed to start Rybelsus  in Sep and fasting glucose has improved, appetite is down, better snacks, glucose fasting between 86 - 160 but mostly below 140  He has associated dyslipidemia, obesity , HTN and microalbuminuria.    HTN: BP is at goal no chest pain, palpitation or sob. He states he was getting dizzy when getting up from his truck and once after getting up from a booth at [employer], he went to see Dr. Cherilyn due to adrenal insufficiency and he decreased his bp medication , he is feeling well since. No more orthostatic changes. Currently on 10 mg of lisinopril  and doing well    Atherosclerosis  aorta: he is now on higher dose of Rosuvastatin  at 20 mg , LDL at goal now at 61 , we will recheck labs today    Adrenal insufficiency/Adrenal mass bilaterally : size of adrenal glands improved still taking medication given by Dr. Cherilyn , it happened while hospitalized and masses likely from subdural hematoma and he is doing well on medications Recently seen by Endo and stable    Depression/anxiety:  Phq 9 is negative. He states symptoms are controlled and wants to continue taking Celexa  . Doing well    LUTS: he states  symptoms started after hospitalization and ICU stay back in 2020, he was seen by Urologist and had TURP 09/2021, he is feeling better . Seen by urologist recently and released from their care .Last PSA is trending up and we will recheck in Dec and if still trending up we will refer him back    Family history of breast cancer: mother at age 73 and maternal uncle around age 30, mother has a recurrence. He states uncle was genetically tested and negative work up . He has noticed left gynecomastia, initially nipple was sore but now growing and tender. No redness or nipple discharge    New lung nodule: he ws  going back in August  2024 for a repeat CT but it was too costly, he is waiting to get medicare next year   Tachycardia: heart rate is up at home but denies palpitation     Patient Active Problem List   Diagnosis Date Noted   Perennial allergic rhinitis with seasonal variation 06/22/2022   Adrenal insufficiency (HCC) 03/23/2022   Microalbuminuria 03/23/2022   History of total knee arthroplasty 09/04/2018   Coronary atherosclerosis due to calcified coronary lesion 12/02/2017   Atherosclerosis of aorta (HCC) 06/17/2015   Fatty liver disease, nonalcoholic 06/17/2015   Renal cyst, right 06/17/2015   BPH (benign prostatic hyperplasia)  05/25/2015   Microscopic hematuria 05/25/2015   Second degree hemorrhoids    Diverticulosis of large intestine without diverticulitis    Asthma, mild intermittent, well-controlled 10/16/2014   Allergic rhinitis 10/13/2014   ED (erectile dysfunction) of organic origin 07/22/2014   Gastro-esophageal reflux disease without esophagitis 07/22/2014   Benign hypertension 07/22/2014   Type 2 diabetes mellitus with other diabetic kidney complication (HCC) 07/22/2014   Adult BMI 30+ 07/22/2014   Chronic obstructive pulmonary disease (HCC) 07/22/2014   Depression with anxiety 07/22/2014   Dyslipidemia 07/22/2014   Genital herpes 07/22/2014   Nonarteritic ischemic  optic neuropathy 07/22/2014   Osteoarthritis of both knees 07/22/2014   OSA and COPD overlap syndrome (HCC) 05/14/2013   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   Hematuria 09/20/2006    Past Surgical History:  Procedure Laterality Date   BLADDER REPAIR     COLONOSCOPY WITH PROPOFOL  N/A 04/07/2015   Procedure: COLONOSCOPY WITH PROPOFOL ;  Surgeon: Rogelia Copping, MD;  Location: [facility];  Service: Endoscopy;  Laterality: N/A;  Diabetic - oral meds CPAP   HAND SURGERY Left    4TH AND 5TH FINGER   I & D KNEE WITH POLY EXCHANGE Right 09/18/2018   Procedure: IRRIGATION AND DEBRIDEMENT KNEE WITH POLY EXCHANGE;  Surgeon: Leora Lynwood SAUNDERS, MD;  Location: [facility];  Service: Orthopedics;  Laterality: Right;   KNEE ARTHROSCOPY Right    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   TOTAL KNEE ARTHROPLASTY Left 09/17/2018   Procedure: I &D left knee and polyethylene exchange;  Surgeon: Leora Lynwood SAUNDERS, MD;  Location: [facility];  Service: Orthopedics;  Laterality: Left;   TRANSURETHRAL RESECTION OF PROSTATE N/A 09/28/2021   Procedure: TRANSURETHRAL RESECTION OF THE PROSTATE (TURP);  Surgeon: Penne Knee, MD;  Location: [facility];  Service: Urology;  Laterality: N/A;   TRANSURETHRAL RESECTION OF PROSTATE  09/28/2021   VEIN SURGERY Right     Family History  Problem Relation Age of Onset   Cancer Mother 49       breast   Hypertension Mother    Diabetes Mother    Cancer Father        skin cancer   Diabetes Father    Hypertension Father    Cancer Maternal Grandfather        Prostate   Diabetes Paternal Grandfather    Seizures Daughter    Lupus Daughter    Cancer Maternal Uncle        breast cancer   Cancer Maternal Uncle 79       prostate   Cancer Maternal Uncle 34       lung   Bladder Cancer Neg Hx    Kidney cancer Neg Hx     Social History   Tobacco Use   Smoking status: Former    Current packs/day: 0.00    Average packs/day: 1 pack/day for 10.0 years (10.0 ttl pk-yrs)     Types: Cigarettes    Start date: 04/20/1995    Quit date: 04/19/2005    Years since quitting: 18.0   Smokeless tobacco: Never  Substance Use Topics   Alcohol use: Yes    Alcohol/week: 10.0 standard drinks of alcohol    Types: 10 Cans of beer per week    Comment: occasional     Current Outpatient Medications:    acetaminophen  (TYLENOL ) 500 MG tablet, Take 1 tablet (500 mg total) by mouth every 6 (six) hours as needed., Disp: 90 tablet, Rfl: 0   acetic acid -hydrocortisone  (VOSOL -HC) OTIC solution,  Place 3 drops into both ears 2 (two) times daily., Disp: 10 mL, Rfl: 0   acidophilus (RISAQUAD) CAPS capsule, Take 1 capsule by mouth daily., Disp: 30 capsule, Rfl: 0   albuterol  (VENTOLIN  HFA) 108 (90 Base) MCG/ACT inhaler, INHALE 2 PUFFS BY MOUTH EVERY 6 HOURS AS NEEDED, Disp: 8.5 each, Rfl: 0   Azelastine -Fluticasone  137-50 MCG/ACT SUSP, Place 1 spray into the nose every 12 (twelve) hours., Disp: 23 g, Rfl: 2   citalopram  (CELEXA ) 20 MG tablet, Take 1 tablet (20 mg total) by mouth daily., Disp: 90 tablet, Rfl: 1   Dapagliflozin  Pro-metFORMIN  ER (XIGDUO  XR) 08-998 MG TB24, Take 2 tablets by mouth daily., Disp: 180 tablet, Rfl: 0   diclofenac  Sodium (VOLTAREN ) 1 % GEL, Apply 4 g topically 4 (four) times daily. (Patient taking differently: Apply 4 g topically as needed.), Disp: 300 g, Rfl: 1   famotidine  (PEPCID ) 20 MG tablet, Take 20 mg by mouth 2 (two) times daily., Disp: , Rfl:    fludrocortisone  (FLORINEF ) 0.1 MG tablet, Take 1 tablet (0.1 mg total) by mouth daily., Disp: 60 tablet, Rfl: 0   fluticasone  furoate-vilanterol (BREO ELLIPTA ) 100-25 MCG/ACT AEPB, Inhale 1 puff into the lungs daily., Disp: 180 each, Rfl: 1   hydrocortisone  (CORTEF ) 20 MG tablet, Take 0.5-1 tablets (10-20 mg total) by mouth 2 (two) times daily. 1 in am and half at night, Disp: 60 tablet, Rfl: 0   ketoconazole  (NIZORAL ) 2 % cream, Apply 1 Application topically daily., Disp: 60 g, Rfl: 1   lisinopril  (ZESTRIL ) 20 MG  tablet, Take 1 tablet (20 mg total) by mouth daily., Disp: 90 tablet, Rfl: 1   Multiple Vitamins-Minerals (MENS MULTIVITAMIN PLUS) TABS, Take by mouth., Disp: , Rfl:    Omega-3 Fatty Acids (FISH OIL) 1000 MG CAPS, Take by mouth daily., Disp: , Rfl:    pioglitazone  (ACTOS ) 15 MG tablet, Take 1 tablet (15 mg total) by mouth daily., Disp: 90 tablet, Rfl: 0   rosuvastatin  (CRESTOR ) 20 MG tablet, Take 1 tablet (20 mg total) by mouth daily., Disp: 90 tablet, Rfl: 1   Semaglutide  (RYBELSUS ) 7 MG TABS, Take 1 tablet (7 mg total) by mouth daily., Disp: 90 tablet, Rfl: 0  Allergies  Allergen Reactions   Heparin      HIT: Heparin  antibody positive; 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   ROS  Ten systems reviewed and is negative except as mentioned in HPI    Objective  Vitals:   04/19/23 0737  BP: 124/76  Pulse: 97  Resp: 16  Temp: 98.7 F (37.1 C)  TempSrc: Oral  SpO2: 98%  Weight: (!) 340 lb (154.2 kg)  Height: 6' 4 (1.93 m)    Body mass index is 41.39 kg/m.  Physical Exam  Constitutional: Patient appears well-developed and well-nourished. Obese  No distress.  HEENT: head atraumatic, normocephalic, pupils equal and reactive to light, neck supple Cardiovascular: Normal rate, regular rhythm and normal heart sounds.  No murmur heard. No BLE edema. Pulmonary/Chest: Effort normal and breath sounds normal. No respiratory distress. Breast : nodule at 12 o'clock, left breast larger than right, no nipple discharge or rashes noticed  Abdominal: Soft.  There is no tenderness. Umbilical hernia reducible  Psychiatric: Patient has a normal mood and affect. behavior is normal. Judgment  and thought content normal.   Recent Results (from the past 2160 hours)  POCT HgB A1C     Status:  Abnormal   Collection Time: 04/19/23  7:58 AM  Result Value Ref Range   Hemoglobin A1C 6.9 (A) 4.0 - 5.6 %   HbA1c POC (<> result, manual entry)     HbA1c, POC (prediabetic range)     HbA1c, POC (controlled diabetic range)      Diabetic Foot Exam:     PHQ2/9:    04/19/2023    7:37 AM 12/30/2022    8:00 AM 10/01/2022    7:43 AM 06/22/2022    9:19 AM 03/23/2022    7:40 AM  Depression screen PHQ 2/9  Decreased Interest 0 0 0 0 0  Down, Depressed, Hopeless 0 0 0 0 0  PHQ - 2 Score 0 0 0 0 0  Altered sleeping  0 0 0 0  Tired, decreased energy  0 0 0 0  Change in appetite  0 0 0 0  Feeling bad or failure about yourself   0 0 0 0  Trouble concentrating  0 0 0 0  Moving slowly or fidgety/restless  0 0 0 0  Suicidal thoughts  0 0 0 0  PHQ-9 Score  0 0 0 0  Difficult doing work/chores  Not difficult at all       phq 9 is negative   Assessment & Plan  1. 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HCC) (Primary)  - POCT HgB A1C - pioglitazone  (ACTOS ) 15 MG tablet; Take 1 tablet (15 mg total) by mouth daily.  Dispense: 90 tablet; Refill: 0 - Semaglutide  (RYBELSUS ) 7 MG TABS; Take 1 tablet (7 mg total) by mouth daily.  Dispense: 90 tablet; Refill: 0 - Dapagliflozin  Pro-metFORMIN  ER (XIGDUO  XR) 08-998 MG TB24; Take 2 tablets by mouth daily.  Dispense: 180 tablet; Refill: 0 - COMPLETE METABOLIC PANEL WITH GFR - CBC with Differential/Platelet  2. Atherosclerosis of aorta (HCC)  - Lipid panel  3. Adrenal insufficiency (HCC)  Keep follow up with Endo   4. OSA and COPD overlap syndrome (HCC)  compliant  5. COPD with asthma (HCC)  Continue Breo   6. History of total bilateral knee replacement  Doing well   7. Benign hypertension  - lisinopril  (ZESTRIL ) 10 MG tablet; Take 1 tablet (10 mg total) by mouth daily.  Dispense: 90 tablet; Refill: 0 - COMPLETE METABOLIC PANEL WITH GFR - CBC with Differential/Platelet  8. Gynecomastia, male  - FSH/LH - Prolactin - US   LIMITED ULTRASOUND INCLUDING AXILLA LEFT BREAST ; Future - MM 3D DIAGNOSTIC MAMMOGRAM UNILATERAL LEFT BREAST; Future  9. Tachycardia  - TSH  10. PSA elevation  - PSA  11. Breast lump on left side at 12 o'clock position  - US  LIMITED ULTRASOUND INCLUDING AXILLA LEFT BREAST ; Future - MM 3D DIAGNOSTIC MAMMOGRAM UNILATERAL LEFT BREAST; Future

## 2023-04-25 ENCOUNTER — Other Ambulatory Visit: Payer: Self-pay

## 2023-04-25 DIAGNOSIS — D649 Anemia, unspecified: Secondary | ICD-10-CM

## 2023-04-26 ENCOUNTER — Ambulatory Visit
Admission: RE | Admit: 2023-04-26 | Discharge: 2023-04-26 | Discharge status: Home / Self Care | Payer: Self-pay | Source: Ambulatory Visit | Attending: Family Medicine | Admitting: Family Medicine

## 2023-04-26 ENCOUNTER — Ambulatory Visit
Admission: RE | Admit: 2023-04-26 | Discharge: 2023-04-26 | Disposition: A | Discharge status: Home / Self Care | Payer: 59 | Source: Ambulatory Visit | Attending: Family Medicine | Admitting: Family Medicine

## 2023-04-26 DIAGNOSIS — N62 Hypertrophy of breast: Secondary | ICD-10-CM | POA: Diagnosis present

## 2023-04-26 DIAGNOSIS — N6325 Unspecified lump in the left breast, overlapping quadrants: Secondary | ICD-10-CM

## 2023-04-26 LAB — COMPLETE METABOLIC PANEL WITH GFR
AG Ratio: 1.8 (calc) (ref 1.0–2.5)
ALT: 33 U/L (ref 9–46)
AST: 23 U/L (ref 10–35)
Albumin: 4.6 g/dL (ref 3.6–5.1)
Alkaline phosphatase (APISO): 42 U/L (ref 35–144)
BUN: 22 mg/dL (ref 7–25)
CO2: 25 mmol/L (ref 20–32)
Calcium: 9.8 mg/dL (ref 8.6–10.3)
Chloride: 100 mmol/L (ref 98–110)
Creat: 1.31 mg/dL (ref 0.70–1.35)
Globulin: 2.6 g/dL (ref 1.9–3.7)
Glucose, Bld: 118 mg/dL — ABNORMAL HIGH (ref 65–99)
Potassium: 4.6 mmol/L (ref 3.5–5.3)
Sodium: 136 mmol/L (ref 135–146)
Total Bilirubin: 0.5 mg/dL (ref 0.2–1.2)
Total Protein: 7.2 g/dL (ref 6.1–8.1)
eGFR: 61 mL/min/{1.73_m2} (ref >=60)

## 2023-04-26 LAB — CBC WITH DIFFERENTIAL/PLATELET
Absolute Lymphocytes: 1574 {cells}/uL (ref 850–3900)
Absolute Monocytes: 464 {cells}/uL (ref 200–950)
Basophils Absolute: 30 {cells}/uL (ref 0–200)
Basophils Relative: 0.7 %
Eosinophils Absolute: 30 {cells}/uL (ref 15–500)
Eosinophils Relative: 0.7 %
HCT: 40.9 % (ref 38.5–50.0)
Hemoglobin: 13.1 g/dL — ABNORMAL LOW (ref 13.2–17.1)
MCH: 26.2 pg — ABNORMAL LOW (ref 27.0–33.0)
MCHC: 32 g/dL (ref 32.0–36.0)
MCV: 81.8 fL (ref 80.0–100.0)
MPV: 10.3 fL (ref 7.5–12.5)
Monocytes Relative: 10.8 %
Neutro Abs: 2202 {cells}/uL (ref 1500–7800)
Neutrophils Relative %: 51.2 %
Platelets: 207 10*3/uL (ref 140–400)
RBC: 5 10*6/uL (ref 4.20–5.80)
RDW: 13.7 % (ref 11.0–15.0)
Total Lymphocyte: 36.6 %
WBC: 4.3 10*3/uL (ref 3.8–10.8)

## 2023-04-26 LAB — IRON,TIBC AND FERRITIN PANEL
%SAT: 26 % (ref 20–48)
Ferritin: 187 ng/mL (ref 24–380)
Iron: 92 ug/dL (ref 50–180)
TIBC: 349 ug/dL (ref 250–425)

## 2023-04-26 LAB — TEST AUTHORIZATION

## 2023-04-26 LAB — FSH/LH
FSH: 3.8 m[IU]/mL (ref 1.4–12.8)
LH: 3.1 m[IU]/mL (ref 1.6–15.2)

## 2023-04-26 LAB — LIPID PANEL
Cholesterol: 175 mg/dL (ref <200)
HDL: 58 mg/dL (ref >=40)
LDL Cholesterol (Calc): 88 mg/dL
Non-HDL Cholesterol (Calc): 117 mg/dL (ref <130)
Total CHOL/HDL Ratio: 3 (calc) (ref <5.0)
Triglycerides: 193 mg/dL — ABNORMAL HIGH (ref <150)

## 2023-04-26 LAB — TSH: TSH: 1.17 m[IU]/L (ref 0.40–4.50)

## 2023-04-26 LAB — PROLACTIN: Prolactin: 4.3 ng/mL (ref 2.0–18.0)

## 2023-04-26 LAB — PSA: PSA: 2.56 ng/mL (ref <=4.00)

## 2023-04-28 ENCOUNTER — Encounter: Payer: Self-pay | Admitting: Family Medicine

## 2023-04-29 ENCOUNTER — Other Ambulatory Visit: Payer: Self-pay | Admitting: Family Medicine

## 2023-04-29 DIAGNOSIS — N62 Hypertrophy of breast: Secondary | ICD-10-CM

## 2023-06-24 ENCOUNTER — Other Ambulatory Visit: Payer: Self-pay | Admitting: Family Medicine

## 2023-06-24 DIAGNOSIS — F418 Other specified anxiety disorders: Secondary | ICD-10-CM

## 2023-06-24 DIAGNOSIS — E785 Hyperlipidemia, unspecified: Secondary | ICD-10-CM

## 2023-06-24 NOTE — Telephone Encounter (Signed)
 Last f/u 03/2023

## 2023-07-16 ENCOUNTER — Other Ambulatory Visit: Payer: Self-pay | Admitting: Family Medicine

## 2023-07-16 DIAGNOSIS — I10 Essential (primary) hypertension: Secondary | ICD-10-CM

## 2023-07-18 ENCOUNTER — Ambulatory Visit: Payer: Self-pay | Admitting: Family Medicine

## 2023-07-18 ENCOUNTER — Telehealth: Payer: Self-pay | Admitting: Family Medicine

## 2023-07-18 ENCOUNTER — Encounter: Payer: Self-pay | Admitting: Family Medicine

## 2023-07-18 VITALS — BP 122/80 | Temp 98.1°F | Resp 16 | Ht 76.0 in | Wt 340.1 lb

## 2023-07-18 DIAGNOSIS — N401 Enlarged prostate with lower urinary tract symptoms: Secondary | ICD-10-CM

## 2023-07-18 DIAGNOSIS — B351 Tinea unguium: Secondary | ICD-10-CM

## 2023-07-18 DIAGNOSIS — Z7984 Long term (current) use of oral hypoglycemic drugs: Secondary | ICD-10-CM

## 2023-07-18 DIAGNOSIS — J4489 Other specified chronic obstructive pulmonary disease: Secondary | ICD-10-CM

## 2023-07-18 DIAGNOSIS — E1169 Type 2 diabetes mellitus with other specified complication: Secondary | ICD-10-CM

## 2023-07-18 DIAGNOSIS — F418 Other specified anxiety disorders: Secondary | ICD-10-CM

## 2023-07-18 DIAGNOSIS — I10 Essential (primary) hypertension: Secondary | ICD-10-CM

## 2023-07-18 DIAGNOSIS — E1129 Type 2 diabetes mellitus with other diabetic kidney complication: Secondary | ICD-10-CM

## 2023-07-18 DIAGNOSIS — E2740 Unspecified adrenocortical insufficiency: Secondary | ICD-10-CM

## 2023-07-18 DIAGNOSIS — J302 Other seasonal allergic rhinitis: Secondary | ICD-10-CM

## 2023-07-18 DIAGNOSIS — D638 Anemia in other chronic diseases classified elsewhere: Secondary | ICD-10-CM

## 2023-07-18 DIAGNOSIS — G4733 Obstructive sleep apnea (adult) (pediatric): Secondary | ICD-10-CM

## 2023-07-18 DIAGNOSIS — I700 Atherosclerosis of aorta: Secondary | ICD-10-CM

## 2023-07-18 DIAGNOSIS — J3089 Other allergic rhinitis: Secondary | ICD-10-CM

## 2023-07-18 DIAGNOSIS — N62 Hypertrophy of breast: Secondary | ICD-10-CM

## 2023-07-18 LAB — POCT GLYCOSYLATED HEMOGLOBIN (HGB A1C): Hemoglobin A1C: 6.5 % — AB (ref 4.0–5.6)

## 2023-07-18 MED ORDER — LISINOPRIL 10 MG PO TABS: 10.0000 mg | ORAL_TABLET | Freq: Every day | ORAL | 0 refills | Status: DC

## 2023-07-18 MED ORDER — PIOGLITAZONE HCL 15 MG PO TABS: 15.0000 mg | ORAL_TABLET | Freq: Every day | ORAL | 0 refills | Status: DC

## 2023-07-18 NOTE — Progress Notes (Signed)
 Name: Isaac Cross   MRN: [id number]    DOB: 1960-11-13   Date:07/18/2023       Progress Note  Subjective  Chief Complaint  Chief Complaint  Patient presents with   Medical Management of Chronic Issues   HPI   S/P bilateral total knee Replacements:  It was done by  Dr. Odis Luster on May 15th, 2020 in [place]. He continues to do well.  Currently no knee pain, he states hip pain resolved    AR/Asthma/COPD: he is taking Xyzal and using nasal spray prn. Under control . Denies cough , wheezing or SOB   OSA/COPD overlaping syndrome: he wears it every night CPAP machine, no snoring , denies headaches when he wakes up He also has emphysema on CT chest .He has not been using Breo lately    DMII:  He has been on Xigduo and Actos for a while, A1C started to trend up, he finally agreed to start Rybelsus in Sep 2024 and fasting glucose has improved, fasting glucose in the 120's range   He has associated dyslipidemia, obesity , HTN and microalbuminuria and also onychomycosis and would like to see podiatrist    HTN: BP has been at  goal no chest pain, palpitation or sob. No longer having dizziness   Atherosclerosis  aorta: he is now on higher dose of Rosuvastatin at 20 mg , last LDL went up, he is compliant but prefers holding off on getting labs done    Adrenal insufficiency/Adrenal mass bilaterally : size of adrenal glands improved still taking medication given by Dr. Gershon Crane , it happened while hospitalized and masses likely from subdural hematoma and he is doing well on medications Unchanged    Depression/anxiety:  Phq 9 is negative. He states symptoms are controlled and wants to continue taking Celexa .   LUTS: he states symptoms started after hospitalization and ICU stay back in 2020, he was seen by Urologist and had TURP 09/2021, he is feeling better .Last PSA is trending up and we will recheck in Dec and still going up, we will place referral to urologist . We will reach out to Urologist and see  if he needs to go back for a follow up   Family history of breast cancer: mother at age 31 and maternal uncle around age 48, mother has a recurrence. He states uncle was genetically tested and negative work up . He has noticed left gynecomastia, initially nipple was sore but now growing and tender. No redness or nipple discharge , initial labs normal, we will refer to Endo . Normal mammogram, BRAC test mother and uncle negative    New lung nodule: he is due for lung cancer screen, we will schedule after welcome to medicare   Morbid Obesity: BMI over 40, trying to lose weight. He takes Rybelsus but not sure if curbing with his appetite.   Patient Active Problem List   Diagnosis Date Noted   Perennial allergic rhinitis with seasonal variation 06/22/2022   Adrenal insufficiency (HCC) 03/23/2022   Microalbuminuria 03/23/2022   History of total knee arthroplasty 09/04/2018   Coronary atherosclerosis due to calcified coronary lesion 12/02/2017   Atherosclerosis of aorta (HCC) 06/17/2015   Fatty liver disease, nonalcoholic 06/17/2015   Renal cyst, right 06/17/2015   BPH (benign prostatic hyperplasia) 05/25/2015   Microscopic hematuria 05/25/2015   Second degree hemorrhoids    Diverticulosis of large intestine without diverticulitis    Asthma, mild intermittent, well-controlled 10/16/2014   Allergic rhinitis 10/13/2014  ED (erectile dysfunction) of organic origin 07/22/2014   Gastro-esophageal reflux disease without esophagitis 07/22/2014   Benign hypertension 07/22/2014   Type 2 diabetes mellitus with other diabetic kidney complication (HCC) 07/22/2014   Adult BMI 30+ 07/22/2014   Chronic obstructive pulmonary disease (HCC) 07/22/2014   Depression with anxiety 07/22/2014   Dyslipidemia 07/22/2014   Genital herpes 07/22/2014   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   Osteoarthritis of both knees 07/22/2014   OSA and COPD overlap syndrome (HCC) 05/14/2013   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   Hematuria 09/20/2006    Past Surgical History:  Procedure Laterality Date   BLADDER REPAIR     COLONOSCOPY WITH PROPOFOL N/A 04/07/2015   Procedure: COLONOSCOPY WITH PROPOFOL;  Surgeon: Midge Minium, MD;  Location: [facility];  Service: Endoscopy;  Laterality: N/A;  Diabetic - oral meds CPAP   HAND SURGERY Left    4TH AND 5TH FINGER   I & D KNEE WITH POLY EXCHANGE Right 09/18/2018   Procedure: IRRIGATION AND DEBRIDEMENT KNEE WITH POLY EXCHANGE;  Surgeon: Lyndle Herrlich, MD;  Location: [facility];  Service: Orthopedics;  Laterality: Right;   KNEE ARTHROSCOPY Right    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   TOTAL KNEE ARTHROPLASTY Left 09/17/2018   Procedure: I &D left knee and polyethylene exchange;  Surgeon: Lyndle Herrlich, MD;  Location: [facility];  Service: Orthopedics;  Laterality: Left;   TRANSURETHRAL RESECTION OF PROSTATE N/A 09/28/2021   Procedure: TRANSURETHRAL RESECTION OF THE PROSTATE (TURP);  Surgeon: Vanna Scotland, MD;  Location: [facility];  Service: Urology;  Laterality: N/A;   TRANSURETHRAL RESECTION OF PROSTATE  09/28/2021   VEIN SURGERY Right     Family History  Problem Relation Age of Onset   Breast cancer Mother 19   Cancer Mother 60       breast   Hypertension Mother    Diabetes Mother    Cancer Father        skin cancer   Diabetes Father    Hypertension Father    Seizures Daughter    Lupus Daughter    Breast cancer Maternal Uncle    Cancer Maternal Uncle        breast cancer   Cancer Maternal Uncle 84       prostate   Cancer Maternal Uncle 64       lung   Cancer Maternal Grandfather        Prostate   Diabetes Paternal Grandfather    Bladder Cancer Neg Hx    Kidney cancer Neg Hx     Social History   Tobacco Use   Smoking status: Former    Current packs/day: 0.00    Average packs/day: 1 pack/day for 10.0 years (10.0 ttl pk-yrs)    Types: Cigarettes    Start date: 04/20/1995    Quit date: 04/19/2005    Years  since quitting: 18.2   Smokeless tobacco: Never  Substance Use Topics   Alcohol use: Yes    Alcohol/week: 10.0 standard drinks of alcohol    Types: 10 Cans of beer per week    Comment: occasional     Current Outpatient Medications:    acetaminophen (TYLENOL) 500 MG tablet, Take 1 tablet (500 mg total) by mouth every 6 (six) hours as needed., Disp: 90 tablet, Rfl: 0   acetic acid-hydrocortisone (VOSOL-HC) OTIC solution, Place 3 drops into both ears 2 (two) times daily., Disp: 10 mL, Rfl: 0   acidophilus (RISAQUAD) CAPS capsule, Take  1 capsule by mouth daily., Disp: 30 capsule, Rfl: 0   albuterol (VENTOLIN HFA) 108 (90 Base) MCG/ACT inhaler, INHALE 2 PUFFS BY MOUTH EVERY 6 HOURS AS NEEDED, Disp: 8.5 each, Rfl: 0   Azelastine-Fluticasone 137-50 MCG/ACT SUSP, Place 1 spray into the nose every 12 (twelve) hours., Disp: 23 g, Rfl: 2   citalopram (CELEXA) 20 MG tablet, TAKE 1 TABLET BY MOUTH EVERY DAY, Disp: 90 tablet, Rfl: 1   Dapagliflozin Pro-metFORMIN ER (XIGDUO XR) 08-998 MG TB24, Take 2 tablets by mouth daily., Disp: 180 tablet, Rfl: 0   diclofenac Sodium (VOLTAREN) 1 % GEL, Apply 4 g topically 4 (four) times daily. (Patient taking differently: Apply 4 g topically as needed.), Disp: 300 g, Rfl: 1   famotidine (PEPCID) 20 MG tablet, Take 20 mg by mouth 2 (two) times daily., Disp: , Rfl:    fludrocortisone (FLORINEF) 0.1 MG tablet, Take 1 tablet (0.1 mg total) by mouth daily., Disp: 60 tablet, Rfl: 0   fluticasone furoate-vilanterol (BREO ELLIPTA) 100-25 MCG/ACT AEPB, Inhale 1 puff into the lungs daily., Disp: 180 each, Rfl: 1   hydrocortisone (CORTEF) 20 MG tablet, Take 0.5-1 tablets (10-20 mg total) by mouth 2 (two) times daily. 1 in am and half at night, Disp: 60 tablet, Rfl: 0   ketoconazole (NIZORAL) 2 % cream, Apply 1 Application topically daily., Disp: 60 g, Rfl: 1   lisinopril (ZESTRIL) 10 MG tablet, Take 1 tablet (10 mg total) by mouth daily., Disp: 90 tablet, Rfl: 0   Multiple  Vitamins-Minerals (MENS MULTIVITAMIN PLUS) TABS, Take by mouth., Disp: , Rfl:    Omega-3 Fatty Acids (FISH OIL) 1000 MG CAPS, Take by mouth daily., Disp: , Rfl:    pioglitazone (ACTOS) 15 MG tablet, Take 1 tablet (15 mg total) by mouth daily., Disp: 90 tablet, Rfl: 0   rosuvastatin (CRESTOR) 20 MG tablet, TAKE 1 TABLET BY MOUTH EVERY DAY, Disp: 90 tablet, Rfl: 1   Semaglutide (RYBELSUS) 7 MG TABS, Take 1 tablet (7 mg total) by mouth daily., Disp: 90 tablet, Rfl: 0  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   ROS  Ten systems reviewed and is negative except as mentioned in HPI    Objective Physical Exam Constitutional: Patient appears well-developed and well-nourished. Obese  No distress.  HEENT: head atraumatic, normocephalic, pupils equal and reactive to light, neck supple Cardiovascular: Normal rate, regular rhythm and normal heart sounds.  No murmur heard. Trace  BLE edema. Pulmonary/Chest: Effort normal and breath sounds normal. No respiratory distress. Abdominal: Soft.  There is no tenderness. Toenail: thick toenails, brittle  Psychiatric: Patient has a normal mood and affect. behavior is normal. Judgment and thought content normal.   Vitals:   07/18/23 0809  BP: 122/80  Resp: 16  Temp: 98.1 F (36.7 C)  SpO2: 98%  Weight: (!) 340 lb 1.6 oz (154.3 kg)  Height: 6\' 4"  (1.93 m)    Body mass index is 41.4 kg/m.  Recent Results (from the past 2160 hours)  POCT HgB A1C     Status: Abnormal   Collection Time: 04/19/23  7:58 AM  Result Value Ref Range   Hemoglobin A1C 6.9 (A) 4.0 - 5.6 %  HbA1c POC (<> result, manual entry)     HbA1c, POC (prediabetic range)     HbA1c, POC (controlled diabetic  range)    FSH/LH     Status: None   Collection Time: 04/19/23  8:35 AM  Result Value Ref Range   FSH 3.8 1.4 - 12.8 mIU/mL   LH 3.1 1.6 - 15.2 mIU/mL  Prolactin     Status: None   Collection Time: 04/19/23  8:35 AM  Result Value Ref Range   Prolactin 4.3 2.0 - 18.0 ng/mL  Lipid panel     Status: Abnormal   Collection Time: 04/19/23  8:35 AM  Result Value Ref Range   Cholesterol 175 <200 mg/dL   HDL 58 > OR = 40 mg/dL   Triglycerides 098 (H) <150 mg/dL   LDL Cholesterol (Calc) 88 mg/dL (calc)    Comment: Reference range: <100 . Desirable range <100 mg/dL for primary prevention;   <70 mg/dL for patients with CHD or diabetic patients  with > or = 2 CHD risk factors. Marland Kitchen LDL-C is now calculated using the Martin-Hopkins  calculation, which is a validated novel method providing  better accuracy than the Friedewald equation in the  estimation of LDL-C.  Horald Pollen et al. Lenox Ahr. 1191;478(29): 2061-2068  (http://education.[facility].com/faq/FAQ164)    Total CHOL/HDL Ratio 3.0 <5.0 (calc)   Non-HDL Cholesterol (Calc) 117 <130 mg/dL (calc)    Comment: For patients with diabetes plus 1 major ASCVD risk  factor, treating to a non-HDL-C goal of <100 mg/dL  (LDL-C of <56 mg/dL) is considered a therapeutic  option.   COMPLETE METABOLIC PANEL WITH GFR     Status: Abnormal   Collection Time: 04/19/23  8:35 AM  Result Value Ref Range   Glucose, Bld 118 (H) 65 - 99 mg/dL    Comment: .            Fasting reference interval . For someone without known diabetes, a glucose value between 100 and 125 mg/dL is consistent with prediabetes and should be confirmed with a follow-up test. .    BUN 22 7 - 25 mg/dL   Creat 2.13 0.86 - 5.78 mg/dL   eGFR 61 > OR = 60 IO/NGE/9.52W4   BUN/Creatinine Ratio SEE NOTE: 6 - 22 (calc)    Comment:    Not Reported: BUN and Creatinine are within    reference range. .    Sodium 136 135 - 146 mmol/L   Potassium 4.6 3.5 - 5.3 mmol/L   Chloride 100 98 -  110 mmol/L   CO2 25 20 - 32 mmol/L   Calcium 9.8 8.6 - 10.3 mg/dL   Total Protein 7.2 6.1 - 8.1 g/dL   Albumin 4.6 3.6 - 5.1 g/dL   Globulin 2.6 1.9 - 3.7 g/dL (calc)   AG Ratio 1.8 1.0 - 2.5 (calc)   Total Bilirubin 0.5 0.2 - 1.2 mg/dL   Alkaline phosphatase (APISO) 42 35 - 144 U/L   AST 23 10 - 35 U/L   ALT 33 9 - 46 U/L  CBC with Differential/Platelet     Status: Abnormal   Collection Time: 04/19/23  8:35 AM  Result Value Ref Range   WBC 4.3 3.8 - 10.8 Thousand/uL   RBC 5.00 4.20 - 5.80 Million/uL   Hemoglobin 13.1 (L) 13.2 - 17.1 g/dL   HCT 13.2 44.0 - 10.2 %   MCV 81.8 80.0 - 100.0 fL   MCH 26.2 (L) 27.0 - 33.0 pg   MCHC 32.0 32.0 -  36.0 g/dL    Comment: For adults, a slight decrease in the calculated MCHC value (in the range of 30 to 32 g/dL) is most likely not clinically significant; however, it should be interpreted with caution in correlation with other red cell parameters and the patient's clinical condition.    RDW 13.7 11.0 - 15.0 %   Platelets 207 140 - 400 Thousand/uL   MPV 10.3 7.5 - 12.5 fL   Neutro Abs 2,202 1,500 - 7,800 cells/uL   Absolute Lymphocytes 1,574 850 - 3,900 cells/uL   Absolute Monocytes 464 200 - 950 cells/uL   Eosinophils Absolute 30 15 - 500 cells/uL   Basophils Absolute 30 0 - 200 cells/uL   Neutrophils Relative % 51.2 %   Total Lymphocyte 36.6 %   Monocytes Relative 10.8 %   Eosinophils Relative 0.7 %   Basophils Relative 0.7 %  TSH     Status: None   Collection Time: 04/19/23  8:35 AM  Result Value Ref Range   TSH 1.17 0.40 - 4.50 mIU/L  PSA     Status: None   Collection Time: 04/19/23  8:35 AM  Result Value Ref Range   PSA 2.56 < OR = 4.00 ng/mL    Comment: The total PSA value from this assay system is  standardized against the WHO standard. The test  result will be approximately 20% lower when compared  to the equimolar-standardized total PSA (Beckman  Coulter). Comparison of serial PSA results should be  interpreted with  this fact in mind. . This test was performed using the Siemens  chemiluminescent method. Values obtained from  different assay methods cannot be used interchangeably. PSA levels, regardless of value, should not be interpreted as absolute evidence of the presence or absence of disease.   Iron, TIBC and Ferritin Panel     Status: None   Collection Time: 04/19/23  8:35 AM  Result Value Ref Range   Iron 92 50 - 180 mcg/dL   TIBC 086 578 - 469 mcg/dL (calc)   %SAT 26 20 - 48 % (calc)   Ferritin 187 24 - 380 ng/mL  TEST AUTHORIZATION     Status: None   Collection Time: 04/19/23  8:35 AM  Result Value Ref Range   TEST NAME: IRON, TIBC AND FERRITIN PANEL    TEST CODE: [id number]    CLIENT CONTACT: LESLIE SMITH    REPORT ALWAYS MESSAGE SIGNATURE      Comment: . The laboratory testing on this patient was verbally requested or confirmed by the ordering physician or his or her authorized representative after contact with an employee of [employer]. Federal regulations require that we maintain on file written authorization for all laboratory testing.  Accordingly we are asking that the ordering physician or his or her authorized representative sign a copy of this report and promptly return it to the client service representative. . . Signature:____________________________________________________ . Please fax this signed page to [phone] or return it via your [employer] courier.     Diabetic Foot Exam:     PHQ2/9:    07/18/2023    8:10 AM 04/19/2023    7:37 AM 12/30/2022    8:00 AM 10/01/2022    7:43 AM 06/22/2022    9:19 AM  Depression screen PHQ 2/9  Decreased Interest 0 0 0 0 0  Down, Depressed, Hopeless 0 0 0 0 0  PHQ - 2 Score 0 0 0 0 0  Altered sleeping 0  0 0 0  Tired, decreased energy 0  0 0 0  Change in appetite 0  0 0 0  Feeling bad or failure about yourself  0  0 0 0  Trouble concentrating 0  0 0 0  Moving slowly or fidgety/restless   0 0 0   Suicidal thoughts 0  0 0 0  PHQ-9 Score 0  0 0 0  Difficult doing work/chores Not difficult at all  Not difficult at all      phq 9 is negative  Fall Risk:    07/18/2023    8:10 AM 04/19/2023    7:37 AM 12/30/2022    8:00 AM 10/01/2022    7:43 AM 06/22/2022    9:19 AM  Fall Risk   Falls in the past year? 0 0 0 0 0  Number falls in past yr: 0 0 0 0   Injury with Fall? 0 0 0 0   Risk for fall due to :  No Fall Risks  No Fall Risks No Fall Risks  Follow up Falls evaluation completed Falls evaluation completed  Falls prevention discussed Falls prevention discussed    Assessment & Plan 1. Atherosclerosis of aorta (HCC) (Primary)  Continue Crestor, consider going up on statin therapy   2. Adrenal insufficiency (HCC)  - Ambulatory referral to Endocrinology  3. 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HCC)  - POCT HgB A1C  4. OSA and COPD overlap syndrome (HCC)  Continue CPAP  5. COPD with asthma (HCC)  Not using Breo lately but symptoms controlled  6. Gynecomastia, male  - Ambulatory referral to Endocrinology  7. Morbid obesity (HCC)  Discussed with the patient the risk posed by an increased BMI. Discussed importance of portion control, calorie counting and at least 150 minutes of physical activity weekly. Avoid sweet beverages and drink more water. Eat at least 6 servings of fruit and vegetables daily    8. Perennial allergic rhinitis with seasonal variation  Continue Xyzal  9. Benign prostatic hyperplasia with nocturia  We will try to contact urologist to see if he needs to go back due to raising PsA levels  10. Depression with anxiety  Continue celexa  11. Anemia of chronic disease  stable  12. Onychomycosis of multiple toenails with type 2 diabetes mellitus (HCC)  - Ambulatory referral to Podiatry

## 2023-07-18 NOTE — Telephone Encounter (Signed)
 Refill sent.

## 2023-07-18 NOTE — Telephone Encounter (Signed)
 Refill from [facility]   lisinopril (ZESTRIL) 10 MG tablet    pioglitazone (ACTOS) 15 MG tablet

## 2023-07-18 NOTE — Telephone Encounter (Signed)
 [facility] no longer [facility] on file

## 2023-07-20 ENCOUNTER — Other Ambulatory Visit: Payer: Self-pay

## 2023-07-20 ENCOUNTER — Telehealth: Payer: Self-pay

## 2023-07-20 ENCOUNTER — Telehealth: Payer: Self-pay | Admitting: Family Medicine

## 2023-07-20 DIAGNOSIS — J449 Chronic obstructive pulmonary disease, unspecified: Secondary | ICD-10-CM

## 2023-07-20 DIAGNOSIS — F418 Other specified anxiety disorders: Secondary | ICD-10-CM

## 2023-07-20 DIAGNOSIS — E1129 Type 2 diabetes mellitus with other diabetic kidney complication: Secondary | ICD-10-CM

## 2023-07-20 MED ORDER — RYBELSUS 7 MG PO TABS: 7.0000 mg | ORAL_TABLET | Freq: Every day | ORAL | 0 refills | Status: DC

## 2023-07-20 MED ORDER — FLUTICASONE FUROATE-VILANTEROL 100-25 MCG/ACT IN AEPB: 1.0000 | INHALATION_SPRAY | Freq: Every day | RESPIRATORY_TRACT | 1 refills | Status: DC

## 2023-07-20 MED ORDER — CITALOPRAM HYDROBROMIDE 20 MG PO TABS: 20.0000 mg | ORAL_TABLET | Freq: Every day | ORAL | 0 refills | Status: DC

## 2023-07-20 MED ORDER — DAPAGLIFLOZIN PRO-METFORMIN ER 5-1000 MG PO TB24: 2.0000 | ORAL_TABLET | Freq: Every day | ORAL | 0 refills | Status: DC

## 2023-07-20 NOTE — Telephone Encounter (Signed)
 Patient preference for [facility] mail.

## 2023-07-20 NOTE — Telephone Encounter (Signed)
Refill sent to local pharmacy.

## 2023-07-20 NOTE — Telephone Encounter (Signed)
 [facility] refill on    Dapagliflozin Pro-metFORMIN ER (XIGDUO XR) 08-998 MG TB24

## 2023-07-20 NOTE — Telephone Encounter (Signed)
 Refill from [facility]  albuterol (VENTOLIN HFA) 108 (90 Base) MCG/ACT inhaler   fluticasone furoate-vilanterol (BREO ELLIPTA) 100-25 MCG/ACT AEPB   citalopram (CELEXA) 20 MG tablet

## 2023-07-21 ENCOUNTER — Telehealth: Payer: Self-pay | Admitting: Family Medicine

## 2023-07-21 NOTE — Telephone Encounter (Signed)
 Have been sent to Mail already

## 2023-07-21 NOTE — Telephone Encounter (Signed)
 Received fax from [facility] asking for refills on:  Rosuvastatin 20 mg Citalopram  20 mg Rybelsus 7 mg

## 2023-07-22 ENCOUNTER — Ambulatory Visit: Admitting: Podiatry

## 2023-07-22 DIAGNOSIS — B351 Tinea unguium: Secondary | ICD-10-CM

## 2023-07-22 DIAGNOSIS — E119 Type 2 diabetes mellitus without complications: Secondary | ICD-10-CM | POA: Diagnosis not present

## 2023-07-22 DIAGNOSIS — L84 Corns and callosities: Secondary | ICD-10-CM

## 2023-07-22 DIAGNOSIS — M79674 Pain in right toe(s): Secondary | ICD-10-CM | POA: Diagnosis not present

## 2023-07-22 DIAGNOSIS — E1129 Type 2 diabetes mellitus with other diabetic kidney complication: Secondary | ICD-10-CM

## 2023-07-22 DIAGNOSIS — M79675 Pain in left toe(s): Secondary | ICD-10-CM

## 2023-07-22 NOTE — Progress Notes (Signed)
 Subjective: Isaac Cross presents today for annual diabetic foot examination.  Patient denies any h/o foot wounds.  PCP is Alba Cory, MD , and last visit was December 30, 2022.  Past Medical History:  Diagnosis Date   Allergy    Anemia    Anxiety    Arthritis    knees   Asthma, mild intermittent, well-controlled    Chronic obstructive asthma (HCC)    Chronic obstructive pulmonary disease (HCC) 07/22/2014   COPD (chronic obstructive pulmonary disease) (HCC)    Depression with anxiety    Diabetes (HCC)    GERD (gastroesophageal reflux disease)    HBP (high blood pressure)    High cholesterol    Renal cyst, right    Sleep apnea    CPAP   Swelling     Patient Active Problem List   Diagnosis Date Noted   Perennial allergic rhinitis with seasonal variation 06/22/2022   Adrenal insufficiency (HCC) 03/23/2022   Microalbuminuria 03/23/2022   History of total knee arthroplasty 09/04/2018   Coronary atherosclerosis due to calcified coronary lesion 12/02/2017   Atherosclerosis of aorta (HCC) 06/17/2015   Fatty liver disease, nonalcoholic 06/17/2015   Renal cyst, right 06/17/2015   BPH (benign prostatic hyperplasia) 05/25/2015   Microscopic hematuria 05/25/2015   Second degree hemorrhoids    Diverticulosis of large intestine without diverticulitis    Asthma, mild intermittent, well-controlled 10/16/2014   Allergic rhinitis 10/13/2014   ED (erectile dysfunction) of organic origin 07/22/2014   Gastro-esophageal reflux disease without esophagitis 07/22/2014   Benign hypertension 07/22/2014   Type 2 diabetes mellitus with other diabetic kidney complication (HCC) 07/22/2014   Adult BMI 30+ 07/22/2014   Chronic obstructive pulmonary disease (HCC) 07/22/2014   Depression with anxiety 07/22/2014   Dyslipidemia 07/22/2014   Genital herpes 07/22/2014   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   Osteoarthritis of both knees 07/22/2014   OSA and COPD overlap  syndrome (HCC) 05/14/2013   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   Hematuria 09/20/2006    Past Surgical History:  Procedure Laterality Date   BLADDER REPAIR     COLONOSCOPY WITH PROPOFOL N/A 04/07/2015   Procedure: COLONOSCOPY WITH PROPOFOL;  Surgeon: Midge Minium, MD;  Location: [facility];  Service: Endoscopy;  Laterality: N/A;  Diabetic - oral meds CPAP   HAND SURGERY Left    4TH AND 5TH FINGER   I & D KNEE WITH POLY EXCHANGE Right 09/18/2018   Procedure: IRRIGATION AND DEBRIDEMENT KNEE WITH POLY EXCHANGE;  Surgeon: Lyndle Herrlich, MD;  Location: [facility];  Service: Orthopedics;  Laterality: Right;   KNEE ARTHROSCOPY Right    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   TOTAL KNEE ARTHROPLASTY Left 09/17/2018   Procedure: I &D left knee and polyethylene exchange;  Surgeon: Lyndle Herrlich, MD;  Location: [facility];  Service: Orthopedics;  Laterality: Left;   TRANSURETHRAL RESECTION OF PROSTATE N/A 09/28/2021   Procedure: TRANSURETHRAL RESECTION OF THE PROSTATE (TURP);  Surgeon: Vanna Scotland, MD;  Location: [facility];  Service: Urology;  Laterality: N/A;   TRANSURETHRAL RESECTION OF PROSTATE  09/28/2021   VEIN SURGERY Right     Current Outpatient Medications on File Prior to Visit  Medication Sig Dispense Refill   acetaminophen (TYLENOL) 500 MG tablet Take 1 tablet (500 mg total) by mouth every 6 (six) hours as needed. 90 tablet 0   acetic acid-hydrocortisone (VOSOL-HC) OTIC solution Place 3 drops into both ears 2 (two) times daily. 10 mL 0   acidophilus (RISAQUAD)  CAPS capsule Take 1 capsule by mouth daily. 30 capsule 0   albuterol (VENTOLIN HFA) 108 (90 Base) MCG/ACT inhaler INHALE 2 PUFFS BY MOUTH EVERY 6 HOURS AS NEEDED 8.5 each 0   Azelastine-Fluticasone 137-50 MCG/ACT SUSP Place 1 spray into the nose every 12 (twelve) hours. 23 g 2   citalopram (CELEXA) 20 MG tablet Take 1 tablet (20 mg total) by mouth daily. 90 tablet 0   Dapagliflozin Pro-metFORMIN ER (XIGDUO  XR) 08-998 MG TB24 Take 2 tablets by mouth daily. 180 tablet 0   diclofenac Sodium (VOLTAREN) 1 % GEL Apply 4 g topically 4 (four) times daily. (Patient taking differently: Apply 4 g topically as needed.) 300 g 1   famotidine (PEPCID) 20 MG tablet Take 20 mg by mouth 2 (two) times daily.     fludrocortisone (FLORINEF) 0.1 MG tablet Take 1 tablet (0.1 mg total) by mouth daily. 60 tablet 0   fluticasone furoate-vilanterol (BREO ELLIPTA) 100-25 MCG/ACT AEPB Inhale 1 puff into the lungs daily. 180 each 1   hydrocortisone (CORTEF) 20 MG tablet Take 0.5-1 tablets (10-20 mg total) by mouth 2 (two) times daily. 1 in am and half at night 60 tablet 0   ketoconazole (NIZORAL) 2 % cream Apply 1 Application topically daily. 60 g 1   lisinopril (ZESTRIL) 10 MG tablet Take 1 tablet (10 mg total) by mouth daily. 90 tablet 0   Multiple Vitamins-Minerals (MENS MULTIVITAMIN PLUS) TABS Take by mouth.     Omega-3 Fatty Acids (FISH OIL) 1000 MG CAPS Take by mouth daily.     pioglitazone (ACTOS) 15 MG tablet Take 1 tablet (15 mg total) by mouth daily. 90 tablet 0   rosuvastatin (CRESTOR) 20 MG tablet TAKE 1 TABLET BY MOUTH EVERY DAY 90 tablet 1   Semaglutide (RYBELSUS) 7 MG TABS Take 1 tablet (7 mg total) by mouth daily. 90 tablet 0   No current facility-administered medications on file prior to visit.     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0 standard drinks of alcohol    Types: 10 Cans of beer per week    Comment: occasional   Drug use: No   Sexual activity: Yes    Partners: Female    Family History  Problem Relation Age of Onset   Breast cancer Mother 45   Cancer Mother 92       breast   Hypertension Mother    Diabetes Mother    Cancer Father        skin cancer   Diabetes Father    Hypertension Father    Seizures Daughter    Lupus Daughter    Breast cancer Maternal Uncle    Cancer Maternal Uncle  breast cancer   Cancer Maternal Uncle 77       prostate   Cancer Maternal Uncle 83       lung   Cancer Maternal Grandfather        Prostate   Diabetes Paternal Grandfather    Bladder Cancer Neg Hx    Kidney cancer Neg Hx     Immunization History  Administered Date(s) Administered   Influenza Split 01/10/2012   Influenza, Seasonal, Injecte, Preservative Fre 12/30/2009, 01/18/2011, 12/30/2022   Influenza,inj,Quad PF,6+ Mos 03/29/2013, 01/01/2015, 01/26/2016, 12/08/2016, 03/06/2018, 12/06/2018, 12/26/2019, 04/16/2021, 12/22/2021   Influenza-Unspecified 01/17/2014   Moderna Covid-19 Fall Seasonal Vaccine 4yrs & older 01/18/2023   PFIZER(Purple Top)SARS-COV-2 Vaccination 04/25/2019, 05/16/2019, 12/11/2019, 08/08/2020   PPD Test 09/28/2018   Pfizer Covid Bivalent Pediatric Vaccine(57mos to <71yrs) 02/06/2021   Pfizer(Comirnaty)Fall Seasonal Vaccine 12 years and older 03/06/2022   Pneumococcal Conjugate-13 12/08/2016   Pneumococcal Polysaccharide-23 04/28/2004   Respiratory Syncytial Virus Vaccine,Recomb Aduvanted(Arexvy) 03/04/2022   Td 02/26/2003   Tdap 03/29/2013, 12/22/2021   Zoster Recombinant(Shingrix) 11/27/2020, 04/16/2021, 07/21/2022, 01/18/2023   Zoster, Live 09/30/2011    Objective: There were no vitals filed for this visit.  Isaac Cross is a pleasant 63 y.o. male in NAD. AAO X 3.   Title   Diabetic Foot Exam - detailed Date & Time: 07/22/2023  9:00 AM  Diabetic Foot exam was performed with the following findings: Yes   Pulse Foot Exam completed.: Yes      Sensory Foot Exam Completed.: Yes Semmes-Weinstein Monofilament Test "+" means "has sensation" and "-" means "no sensation"      Image components are not supported.   Image components are not supported. Image components are not supported.  [place] Comments Vascular Examination: Capillary refill time immediate b/l. Vascular status intact b/l with palpable pedal pulses. Pedal hair present b/l. No pain with calf compression b/l. Skin temperature gradient WNL b/l. No cyanosis or clubbing b/l. No ischemia or gangrene noted b/l. Varicosities present b/l.  Neurological Examination: Sensation grossly intact b/l with 10 gram monofilament. Vibratory sensation intact b/l.   Dermatological Examination: Pedal skin with normal turgor, texture and tone b/l.  No open wounds. No interdigital macerations.   Toenails 1-5 b/l thick, discolored, elongated with subungual debris and pain on dorsal palpation.   Hyperkeratotic lesion(s) left heel.  No erythema, no edema, no drainage, no fluctuance.  Musculoskeletal Examination: Muscle strength 5/5 to all lower extremity muscle groups bilaterally.  Radiographs: None      Lab Results  Component Value Date   HGBA1C 6.5 (A) 07/18/2023   Assessment: 1. Pain due to onychomycosis of toenails of both feet   2. Callus   3. Type 2 diabetes mellitus with other diabetic kidney complication (HCC)   4. Encounter for diabetic foot exam (HCC)      ADA Risk Categorization: Low Risk:  Patient has all of the following: Intact protective sensation No prior foot ulcer  No severe deformity Pedal pulses present  Plan: Diabetic foot examination performed today. All patient's and/or POA's questions/concerns addressed on today's visit. Toenails 1-5 debrided in length and girth without incident. Callus(es) left heel pared with sharp debridement without  incident. Continue daily foot inspections and monitor blood glucose per PCP/Endocrinologist's recommendations.Continue soft, supportive shoe gear daily. Report any pedal injuries to medical professional. Call office if there are any questions/concerns. -Patient/POA to call should there be question/concern in the interim.  Return in about 3 months (around 10/21/2023).  Freddie Breech, DPM  [facility] LOCATION: [address], [place] [address]                   Office [phone]   [facility] LOCATION: [address] [place], [place] [address] Office [phone]

## 2023-07-27 ENCOUNTER — Encounter: Payer: Self-pay | Admitting: Podiatry

## 2023-08-01 ENCOUNTER — Telehealth: Payer: Self-pay | Admitting: Family Medicine

## 2023-08-01 NOTE — Telephone Encounter (Signed)
errenous °

## 2023-08-20 ENCOUNTER — Encounter: Payer: Self-pay | Admitting: Family Medicine

## 2023-08-20 DIAGNOSIS — E785 Hyperlipidemia, unspecified: Secondary | ICD-10-CM

## 2023-08-22 MED ORDER — ROSUVASTATIN CALCIUM 20 MG PO TABS: 20.0000 mg | ORAL_TABLET | Freq: Every day | ORAL | 0 refills | Status: DC

## 2023-09-16 ENCOUNTER — Encounter: Payer: Self-pay | Admitting: Family Medicine

## 2023-09-16 ENCOUNTER — Ambulatory Visit (INDEPENDENT_AMBULATORY_CARE_PROVIDER_SITE_OTHER): Admitting: Family Medicine

## 2023-09-16 VITALS — BP 124/74 | HR 109 | Resp 16 | Ht 74.5 in | Wt 341.6 lb

## 2023-09-16 DIAGNOSIS — H903 Sensorineural hearing loss, bilateral: Secondary | ICD-10-CM | POA: Diagnosis not present

## 2023-09-16 DIAGNOSIS — Z23 Encounter for immunization: Secondary | ICD-10-CM | POA: Diagnosis not present

## 2023-09-16 DIAGNOSIS — I10 Essential (primary) hypertension: Secondary | ICD-10-CM

## 2023-09-16 DIAGNOSIS — R000 Tachycardia, unspecified: Secondary | ICD-10-CM

## 2023-09-16 DIAGNOSIS — Z0001 Encounter for general adult medical examination with abnormal findings: Secondary | ICD-10-CM | POA: Diagnosis not present

## 2023-09-16 DIAGNOSIS — Z1211 Encounter for screening for malignant neoplasm of colon: Secondary | ICD-10-CM

## 2023-09-16 DIAGNOSIS — Z0000 Encounter for general adult medical examination without abnormal findings: Secondary | ICD-10-CM

## 2023-09-16 DIAGNOSIS — Z87891 Personal history of nicotine dependence: Secondary | ICD-10-CM

## 2023-09-16 MED ORDER — LISINOPRIL 5 MG PO TABS: 5.0000 mg | ORAL_TABLET | Freq: Every day | ORAL | 0 refills | Status: DC

## 2023-09-16 MED ORDER — ATENOLOL 25 MG PO TABS: 25.0000 mg | ORAL_TABLET | Freq: Every day | ORAL | 0 refills | Status: DC

## 2023-09-16 NOTE — Addendum Note (Signed)
 Addended by: Arleen Lacer F on: 09/16/2023 02:05 PM   Modules accepted: Orders

## 2023-09-16 NOTE — Progress Notes (Signed)
 Subjective:    Isaac Cross is a 63 y.o. male who presents for a Welcome to Medicare exam.   Cardiac Risk Factors include: advanced age (>26men, >77 women);diabetes mellitus;obesity (BMI >30kg/m2);dyslipidemia;hypertension;male gender     Objective:    Today's Vitals   09/16/23 0908 09/16/23 0933  BP: 124/74   Pulse: (!) 116 (!) 109  Resp: 16   SpO2: 97%   Weight: (!) 341 lb 9.6 oz (154.9 kg)   Height: 6' 2.5" (1.892 m)    Body mass index is 43.27 kg/m.  Medications Outpatient Encounter Medications as of 09/16/2023  Medication Sig   acetic acid -hydrocortisone  (VOSOL -HC) OTIC solution Place 3 drops into both ears 2 (two) times daily.   albuterol  (VENTOLIN  HFA) 108 (90 Base) MCG/ACT inhaler INHALE 2 PUFFS BY MOUTH EVERY 6 HOURS AS NEEDED   atenolol  (TENORMIN ) 25 MG tablet Take 1 tablet (25 mg total) by mouth daily.   Azelastine -Fluticasone  137-50 MCG/ACT SUSP Place 1 spray into the nose every 12 (twelve) hours.   citalopram  (CELEXA ) 20 MG tablet Take 1 tablet (20 mg total) by mouth daily.   Dapagliflozin  Pro-metFORMIN  ER (XIGDUO  XR) 08-998 MG TB24 Take 2 tablets by mouth daily.   famotidine  (PEPCID ) 20 MG tablet Take 20 mg by mouth 2 (two) times daily.   fludrocortisone  (FLORINEF ) 0.1 MG tablet Take 1 tablet (0.1 mg total) by mouth daily.   fluticasone  furoate-vilanterol (BREO ELLIPTA ) 100-25 MCG/ACT AEPB Inhale 1 puff into the lungs daily.   hydrocortisone  (CORTEF ) 20 MG tablet Take 0.5-1 tablets (10-20 mg total) by mouth 2 (two) times daily. 1 in am and half at night   Multiple Vitamins-Minerals (MENS MULTIVITAMIN PLUS) TABS Take by mouth.   Omega-3 Fatty Acids (FISH OIL) 1000 MG CAPS Take by mouth daily.   pioglitazone  (ACTOS ) 15 MG tablet Take 1 tablet (15 mg total) by mouth daily.   rosuvastatin  (CRESTOR ) 20 MG tablet Take 1 tablet (20 mg total) by mouth daily.   Semaglutide  (RYBELSUS ) 7 MG TABS Take 1 tablet (7 mg total) by mouth daily.   [DISCONTINUED] lisinopril   (ZESTRIL ) 10 MG tablet Take 1 tablet (10 mg total) by mouth daily.   diclofenac  Sodium (VOLTAREN ) 1 % GEL Apply 4 g topically 4 (four) times daily. (Patient taking differently: Apply 4 g topically as needed.)   lisinopril  (ZESTRIL ) 5 MG tablet Take 1 tablet (5 mg total) by mouth daily.   [DISCONTINUED] acetaminophen  (TYLENOL ) 500 MG tablet Take 1 tablet (500 mg total) by mouth every 6 (six) hours as needed.   [DISCONTINUED] acidophilus (RISAQUAD) CAPS capsule Take 1 capsule by mouth daily.   [DISCONTINUED] ketoconazole  (NIZORAL ) 2 % cream Apply 1 Application topically daily.   No facility-administered encounter medications on file as of 09/16/2023.     History: Past Medical History:  Diagnosis Date   Allergy    Anemia    Anxiety    Arthritis    knees   Asthma, mild intermittent, well-controlled    Chronic obstructive asthma (HCC)    Chronic obstructive pulmonary disease (HCC) 07/22/2014   COPD (chronic obstructive pulmonary disease) (HCC)    Depression with anxiety    Diabetes (HCC)    GERD (gastroesophageal reflux disease)    HBP (high blood pressure)    High cholesterol    Renal cyst, right    Sleep apnea    CPAP   Swelling    Past Surgical History:  Procedure Laterality Date   BLADDER REPAIR     COLONOSCOPY WITH  PROPOFOL  N/A 04/07/2015   Procedure: COLONOSCOPY WITH PROPOFOL ;  Surgeon: Marnee Sink, MD;  Location: [facility];  Service: Endoscopy;  Laterality: N/A;  Diabetic - oral meds CPAP   HAND SURGERY Left    4TH AND 5TH FINGER   I & D KNEE WITH POLY EXCHANGE Right 09/18/2018   Procedure: IRRIGATION AND DEBRIDEMENT KNEE WITH POLY EXCHANGE;  Surgeon: Jerlyn Moons, MD;  Location: [facility];  Service: Orthopedics;  Laterality: Right;   KNEE ARTHROSCOPY Right    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   TOTAL KNEE ARTHROPLASTY Left 09/17/2018   Procedure: I &D left knee and polyethylene exchange;  Surgeon: Jerlyn Moons, MD;  Location: [facility];   Service: Orthopedics;  Laterality: Left;   TRANSURETHRAL RESECTION OF PROSTATE N/A 09/28/2021   Procedure: TRANSURETHRAL RESECTION OF THE PROSTATE (TURP);  Surgeon: Dustin Gimenez, MD;  Location: [facility];  Service: Urology;  Laterality: N/A;   TRANSURETHRAL RESECTION OF PROSTATE  09/28/2021   VEIN SURGERY Right     Family History  Problem Relation Age of Onset   Breast cancer Mother 52   Cancer Mother 105       breast   Hypertension Mother    Diabetes Mother    Cancer Father        skin cancer   Diabetes Father    Hypertension Father    Seizures Daughter    Lupus Daughter    Breast cancer Maternal Uncle    Cancer Maternal Uncle        breast cancer   Cancer Maternal Uncle 61       prostate   Cancer Maternal Uncle 91       lung   Cancer Maternal Grandfather        Prostate   Diabetes Paternal Grandfather    Bladder Cancer Neg Hx    Kidney cancer Neg Hx    Social History   Occupational History   Occupation: case Production designer, theatre/television/film   Tobacco Use   Smoking status: Former    Current packs/day: 0.00    Average packs/day: 1 pack/day for 10.0 years (10.0 ttl pk-yrs)    Types: Cigarettes    Start date: 04/20/1995    Quit date: 04/19/2005    Years since quitting: 18.4   Smokeless tobacco: Never  Vaping Use   Vaping status: Never Used  Substance and Sexual Activity   Alcohol use: Yes    Alcohol/week: 10.0 standard drinks of alcohol    Types: 10 Cans of beer per week    Comment: occasional   Drug use: No   Sexual activity: Yes    Partners: Female    Tobacco Counseling No longer smokes   Immunizations and Health Maintenance Immunization History  Administered Date(s) Administered   Influenza Split 01/10/2012   Influenza, Seasonal, Injecte, Preservative Fre 12/30/2009, 01/18/2011, 12/30/2022   Influenza,inj,Quad PF,6+ Mos 03/29/2013, 01/01/2015, 01/26/2016, 12/08/2016, 03/06/2018, 12/06/2018, 12/26/2019, 04/16/2021, 12/22/2021   Influenza-Unspecified 01/17/2014   Moderna  Covid-19 Fall Seasonal Vaccine 3yrs & older 01/18/2023   PFIZER(Purple Top)SARS-COV-2 Vaccination 04/25/2019, 05/16/2019, 12/11/2019, 08/08/2020   PNEUMOCOCCAL CONJUGATE-20 09/16/2023   PPD Test 09/28/2018   Pfizer Covid Bivalent Pediatric Vaccine(60mos to <37yrs) 02/06/2021   Pfizer(Comirnaty)Fall Seasonal Vaccine 12 years and older 03/06/2022   Pneumococcal Conjugate-13 12/08/2016   Pneumococcal Polysaccharide-23 04/28/2004   Respiratory Syncytial Virus Vaccine,Recomb Aduvanted(Arexvy) 03/04/2022   Td 02/26/2003   Tdap 03/29/2013, 12/22/2021   Zoster Recombinant(Shingrix) 11/27/2020, 04/16/2021, 07/21/2022, 01/18/2023   Zoster, Live 09/30/2011  Health Maintenance Due  Topic Date Due   COVID-19 Vaccine (8 - Pfizer risk 2024-25 season) 07/19/2023   Diabetic kidney evaluation - Urine ACR  10/01/2023    Activities of Daily Living    09/16/2023    9:10 AM 12/30/2022    8:00 AM  In your present state of health, do you have any difficulty performing the following activities:  Hearing? 0 0  Vision? 1 1  Difficulty concentrating or making decisions? 0 1  Walking or climbing stairs? 0 0  Dressing or bathing? 0 0  Doing errands, shopping? 0 0  Preparing Food and eating ? N   Using the Toilet? N   In the past six months, have you accidently leaked urine? N   Do you have problems with loss of bowel control? N   Managing your Medications? N   Managing your Finances? N   Housekeeping or managing your Housekeeping? N     Physical Exam   Physical Exam (optional), or other factors deemed appropriate based on the beneficiary's medical and social history and current clinical standards.   Advanced Directives: Does Patient Have a Medical Advance Directive?: Yes Type of Advance Directive: Healthcare Power of Attorney, Living will Does patient want to make changes to medical advance directive?: Yes (Inpatient - patient defers changing a medical advance directive at this time - Information  given) Copy of Healthcare Power of Attorney in Chart?: No - copy requested   EKG:  normal EKG, normal sinus rhythm, unchanged from previous tracings, sinus tachycardia     Assessment:     Constitutional: Patient appears well-developed and well-nourished.  No distress.  HEENT: head atraumatic, normocephalic, pupils equal and reactive to light, neck supple Cardiovascular: tachycardia, regular rhythm and normal heart sounds.  No murmur heard. Trace  BLE edema. Pulmonary/Chest: Effort normal and breath sounds normal. No respiratory distress. Abdominal: Soft.  There is no tenderness. Psychiatric: Patient has a normal mood and affect. behavior is normal. Judgment and thought content normal.   Vision/Hearing screen Hearing Screening   500Hz  1000Hz  2000Hz  4000Hz   Right ear Fail Pass Fail Fail  Left ear Pass Pass Fail Fail   Vision Screening   Right eye Left eye Both eyes  Without correction     With correction 20/20 20/100 20/20     Goals   None      Depression Screen    09/16/2023    9:17 AM 07/18/2023    8:10 AM 04/19/2023    7:37 AM 12/30/2022    8:00 AM  PHQ 2/9 Scores  PHQ - 2 Score 0 0 0 0  PHQ- 9 Score 0 0  0     Fall Risk    09/16/2023    9:17 AM  Fall Risk   Falls in the past year? 0  Number falls in past yr: 0  Injury with Fall? 0  Risk for fall due to : No Fall Risks  Follow up Falls prevention discussed;Education provided;Falls evaluation completed    Cognitive Function        09/16/2023    9:19 AM  6CIT Screen  What Year? 0 points  What month? 0 points  What time? 0 points  Count back from 20 0 points  Months in reverse 0 points  Repeat phrase 0 points  Total Score 0 points    Patient Care Team: Lannette Avellino, MD as PCP - General (Family Medicine) Pa, [facility]     Plan:   Assessment &  Plan   1. Welcome to Medicare preventive visit (Primary)  - EKG 12-Lead - Visual acuity screening - Hearing screening; Future  2.  Tachycardia  He took Beta blockers in the past , he has been tachycardic for months now, denies palpitation or SOB. We will adjust bp medication, go down on dose of lisinopril  and add atenolol . He prefers not seeing cardiologist at this time or use a zio patch - atenolol  (TENORMIN ) 25 MG tablet; Take 1 tablet (25 mg total) by mouth daily.  Dispense: 90 tablet; Refill: 0  3. Benign hypertension  - lisinopril  (ZESTRIL ) 5 MG tablet; Take 1 tablet (5 mg total) by mouth daily.  Dispense: 90 tablet; Refill: 0 - atenolol  (TENORMIN ) 25 MG tablet; Take 1 tablet (25 mg total) by mouth daily.  Dispense: 90 tablet; Refill: 0  4. Bilateral sensorineural hearing loss  He has seen ENT in the past but does not want to start hearing aid  5. Need for pneumococcal 20-valent conjugate vaccination  - Pneumococcal conjugate vaccine 20-valent (Prevnar 20)  6. History of tobacco use  - [place] MEDIC[place] SCREENING; Future      I have personally reviewed and noted the following in the patient's chart:   Medical and social history Use of alcohol, tobacco or illicit drugs  Current medications and supplements including opioid prescriptions. Patient is not currently taking opioid prescriptions. Functional ability and status Nutritional status Physical activity Advanced directives List of other physicians Hospitalizations, surgeries, and ER visits in previous 12 months Vitals Screenings to include cognitive, depression, and falls Referrals and appointments  In addition, I have reviewed and discussed with patient certain preventive protocols, quality metrics, and best practice recommendations. A written personalized care plan for preventive services as well as general preventive health recommendations were provided to patient.     Sawyer Mentzer F Liberti Appleton, MD 09/16/2023

## 2023-09-20 ENCOUNTER — Telehealth: Payer: Self-pay | Admitting: Family Medicine

## 2023-09-20 NOTE — Telephone Encounter (Signed)
 Copied from CRM [id number]. Topic: General - Call Back - No Documentation >> Sep 20, 2023  3:10 PM Ameerah G wrote: Caller would like to inform the provider that they can't do the ultra sound medicare screening because the patient is not eligible. Colette stated she can change the order to Triple A Duplex limit. The caller wants to know if the provider still wants an ultrasound done for the patient.  [phone]  Epic Chat: Randell Bussing

## 2023-09-20 NOTE — Telephone Encounter (Signed)
 Duplicate encounter

## 2023-09-21 ENCOUNTER — Other Ambulatory Visit: Payer: Self-pay

## 2023-09-21 ENCOUNTER — Ambulatory Visit: Payer: Self-pay | Admitting: Family Medicine

## 2023-09-21 ENCOUNTER — Telehealth: Payer: Self-pay

## 2023-09-21 ENCOUNTER — Ambulatory Visit
Admission: RE | Admit: 2023-09-21 | Discharge: 2023-09-21 | Disposition: A | Discharge status: Home / Self Care | Source: Ambulatory Visit | Attending: Family Medicine | Admitting: Family Medicine

## 2023-09-21 ENCOUNTER — Ambulatory Visit: Admission: RE | Admit: 2023-09-21 | Source: Ambulatory Visit

## 2023-09-21 DIAGNOSIS — Z87891 Personal history of nicotine dependence: Secondary | ICD-10-CM | POA: Diagnosis present

## 2023-09-21 DIAGNOSIS — Z136 Encounter for screening for cardiovascular disorders: Secondary | ICD-10-CM | POA: Diagnosis present

## 2023-09-21 DIAGNOSIS — Z1211 Encounter for screening for malignant neoplasm of colon: Secondary | ICD-10-CM

## 2023-09-21 MED ORDER — NA SULFATE-K SULFATE-MG SULF 17.5-3.13-1.6 GM/177ML PO SOLN: 1.0000 | Freq: Once | ORAL | 0 refills | Status: AC

## 2023-09-21 NOTE — Telephone Encounter (Signed)
 Gastroenterology Pre-Procedure Review  Request Date: 11/29/23 Requesting Physician: Dr. Ole Berkeley  PATIENT REVIEW QUESTIONS: The patient responded to the following health history questions as indicated:    1. Are you having any GI issues? no 2. Do you have a personal history of Polyps? no 3. Do you have a family history of Colon Cancer or Polyps? no 4. Diabetes Mellitus? Yes.  Patient takes Rybelsus  (7 day stop), Xigduo  XR (3 day stop) Pioglitazone  (1 day stop) Pt advised verbally and noted on instructions as well.  Contact pcp if he has any questions regarding blood sugars as he is preparing. 5. Joint replacements in the past 12 months?no 6. Major health problems in the past 3 months?no 7. Any artificial heart valves, MVP, or defibrillator?no    MEDICATIONS & ALLERGIES:    Patient reports the following regarding taking any anticoagulation/antiplatelet therapy:   Plavix, Coumadin , Eliquis, Xarelto , Lovenox, Pradaxa, Brilinta, or Effient? no Aspirin ? no  Patient confirms/reports the following medications:  Current Outpatient Medications  Medication Sig Dispense Refill   Dapagliflozin  Pro-metFORMIN  ER (XIGDUO  XR) 08-998 MG TB24 Take 2 tablets by mouth daily. 180 tablet 0   pioglitazone  (ACTOS ) 15 MG tablet Take 1 tablet (15 mg total) by mouth daily. 90 tablet 0   Semaglutide  (RYBELSUS ) 7 MG TABS Take 1 tablet (7 mg total) by mouth daily. 90 tablet 0   acetic acid -hydrocortisone  (VOSOL -HC) OTIC solution Place 3 drops into both ears 2 (two) times daily. 10 mL 0   albuterol  (VENTOLIN  HFA) 108 (90 Base) MCG/ACT inhaler INHALE 2 PUFFS BY MOUTH EVERY 6 HOURS AS NEEDED 8.5 each 0   atenolol  (TENORMIN ) 25 MG tablet Take 1 tablet (25 mg total) by mouth daily. 90 tablet 0   Azelastine -Fluticasone  137-50 MCG/ACT SUSP Place 1 spray into the nose every 12 (twelve) hours. 23 g 2   citalopram  (CELEXA ) 20 MG tablet Take 1 tablet (20 mg total) by mouth daily. 90 tablet 0   diclofenac  Sodium (VOLTAREN ) 1 % GEL  Apply 4 g topically 4 (four) times daily. (Patient taking differently: Apply 4 g topically as needed.) 300 g 1   famotidine  (PEPCID ) 20 MG tablet Take 20 mg by mouth 2 (two) times daily.     fludrocortisone  (FLORINEF ) 0.1 MG tablet Take 1 tablet (0.1 mg total) by mouth daily. 60 tablet 0   fluticasone  furoate-vilanterol (BREO ELLIPTA ) 100-25 MCG/ACT AEPB Inhale 1 puff into the lungs daily. 180 each 1   hydrocortisone  (CORTEF ) 20 MG tablet Take 0.5-1 tablets (10-20 mg total) by mouth 2 (two) times daily. 1 in am and half at night 60 tablet 0   lisinopril  (ZESTRIL ) 5 MG tablet Take 1 tablet (5 mg total) by mouth daily. 90 tablet 0   Multiple Vitamins-Minerals (MENS MULTIVITAMIN PLUS) TABS Take by mouth.     Omega-3 Fatty Acids (FISH OIL) 1000 MG CAPS Take by mouth daily.     rosuvastatin  (CRESTOR ) 20 MG tablet Take 1 tablet (20 mg total) by mouth daily. 90 tablet 0   No current facility-administered medications for this visit.    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   AUTHORIZATION INFORMATION Primary Insurance: 1D#: Group #:  Secondary Insurance: 1D#:  Group #:  SCHEDULE INFORMATION: Date: 11/29/23 Time: Location: [facility]

## 2023-09-26 ENCOUNTER — Encounter: Payer: Self-pay | Admitting: Family Medicine

## 2023-09-27 ENCOUNTER — Encounter: Payer: Self-pay | Admitting: Family Medicine

## 2023-09-28 ENCOUNTER — Telehealth: Payer: Self-pay

## 2023-09-28 ENCOUNTER — Other Ambulatory Visit: Payer: Self-pay

## 2023-09-28 DIAGNOSIS — Z122 Encounter for screening for malignant neoplasm of respiratory organs: Secondary | ICD-10-CM

## 2023-09-28 DIAGNOSIS — Z87891 Personal history of nicotine dependence: Secondary | ICD-10-CM

## 2023-09-28 NOTE — Telephone Encounter (Signed)
 Copied from CRM [id number]. Topic: Clinical - Request for Lab/Test Order >> Sep 27, 2023 12:35 PM Isaac Cross wrote: Reason for CRM: Patient called stating his lung cancer screening was cancelled because it was reported it has been 15 years since he smoked but he states it had been 5, patient would like this to be corrected and rescheduled for the lung cancer screening.

## 2023-09-29 NOTE — Telephone Encounter (Signed)
 Patient has been scheduled for LDCT. Per pt's LDCT order his new quit date was 2020.

## 2023-10-02 ENCOUNTER — Other Ambulatory Visit: Payer: Self-pay | Admitting: Family Medicine

## 2023-10-02 DIAGNOSIS — E1129 Type 2 diabetes mellitus with other diabetic kidney complication: Secondary | ICD-10-CM

## 2023-10-07 ENCOUNTER — Ambulatory Visit
Admission: RE | Admit: 2023-10-07 | Discharge: 2023-10-07 | Disposition: A | Discharge status: Home / Self Care | Source: Ambulatory Visit | Attending: Acute Care | Admitting: Acute Care

## 2023-10-07 DIAGNOSIS — Z122 Encounter for screening for malignant neoplasm of respiratory organs: Secondary | ICD-10-CM | POA: Diagnosis present

## 2023-10-07 DIAGNOSIS — Z87891 Personal history of nicotine dependence: Secondary | ICD-10-CM | POA: Diagnosis present

## 2023-10-20 ENCOUNTER — Other Ambulatory Visit: Payer: Self-pay | Admitting: Acute Care

## 2023-10-20 DIAGNOSIS — Z122 Encounter for screening for malignant neoplasm of respiratory organs: Secondary | ICD-10-CM

## 2023-10-20 DIAGNOSIS — Z87891 Personal history of nicotine dependence: Secondary | ICD-10-CM

## 2023-10-28 ENCOUNTER — Ambulatory Visit: Admitting: Podiatry

## 2023-11-10 ENCOUNTER — Other Ambulatory Visit: Payer: Self-pay | Admitting: Family Medicine

## 2023-11-10 ENCOUNTER — Telehealth: Payer: Self-pay | Admitting: Family Medicine

## 2023-11-10 DIAGNOSIS — E1129 Type 2 diabetes mellitus with other diabetic kidney complication: Secondary | ICD-10-CM

## 2023-11-10 DIAGNOSIS — E785 Hyperlipidemia, unspecified: Secondary | ICD-10-CM

## 2023-11-10 DIAGNOSIS — F418 Other specified anxiety disorders: Secondary | ICD-10-CM

## 2023-11-10 MED ORDER — ROSUVASTATIN CALCIUM 20 MG PO TABS: 20.0000 mg | ORAL_TABLET | Freq: Every day | ORAL | 0 refills | Status: DC

## 2023-11-10 NOTE — Telephone Encounter (Signed)
rosuvastatin (CRESTOR) 20 MG tablet

## 2023-11-11 ENCOUNTER — Ambulatory Visit: Admitting: Podiatry

## 2023-11-11 ENCOUNTER — Encounter: Payer: Self-pay | Admitting: Podiatry

## 2023-11-11 DIAGNOSIS — M79675 Pain in left toe(s): Secondary | ICD-10-CM

## 2023-11-11 DIAGNOSIS — B351 Tinea unguium: Secondary | ICD-10-CM

## 2023-11-11 DIAGNOSIS — E1129 Type 2 diabetes mellitus with other diabetic kidney complication: Secondary | ICD-10-CM | POA: Diagnosis not present

## 2023-11-11 DIAGNOSIS — M79674 Pain in right toe(s): Secondary | ICD-10-CM | POA: Diagnosis not present

## 2023-11-14 NOTE — Progress Notes (Signed)
  Subjective:  Patient ID: Isaac Cross, male    DOB: [birth year],  MRN: [id number]  63 y.o. male presents to clinic with  preventative diabetic foot care and callus(es) left foot and painful mycotic toenails that are difficult to trim. Painful toenails interfere with ambulation. Aggravating factors include wearing enclosed shoe gear. Pain is relieved with periodic professional debridement. Painful calluses are aggravated when weightbearing with and without shoegear. Pain is relieved with periodic professional debridement.  Chief Complaint  Patient presents with   Nail Problem    Thick painful toenails, 3 month follow up    New problem(s): None   PCP is Sowles, Krichna, MD.  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   Objective:  Isaac Cross is a pleasant 63 y.o. male morbidly obese in NAD. AAO x 3.  Vascular Examination: Vascular status intact b/l with palpable pedal pulses. Pedal hair present. CFT immediate b/l. No edema. No pain with calf compression b/l. Skin temperature gradient WNL b/l. No cyanosis or clubbing noted b/l LE.  Neurological Examination: Sensation grossly intact b/l with 10 gram monofilament. Vibratory sensation intact b/l.   Dermatological Examination: Pedal skin with normal turgor, texture and tone b/l. Toenails 1-5 b/l thick, discolored, elongated with subungual debris and pain on dorsal palpation. No hyperkeratotic nor porokeratotic lesions present on today's visit. Pedal skin noted to be dry b/l lower extremities.  Musculoskeletal Examination: Muscle strength 5/5 to b/l LE. No pain, crepitus or joint limitation noted with ROM bilateral LE. No gross bony deformities bilaterally.  Radiographs: None  Last A1c:      Latest Ref Rng & Units  07/18/2023    9:03 AM 04/19/2023    7:58 AM 12/30/2022    8:01 AM  Hemoglobin A1C  Hemoglobin-A1c 4.0 - 5.6 % 6.5  6.9  7.6      Assessment:   1. Pain due to onychomycosis of toenails of both feet   2. Type 2 diabetes mellitus with other diabetic kidney complication (HCC)    Plan:  -Patient was evaluated today. All questions/concerns addressed on today's visit. -Continue foot and shoe inspections daily. Monitor blood glucose per PCP/Endocrinologist's recommendations. -Mycotic toenails 1-5 bilaterally were debrided in length and girth with sterile nail nippers and dremel without incident. -Patient has moisturizing lotion at home. He was instructed to apply daily to manage dry skin. -Patient/POA to call should there be question/concern in the interim.  Return in about 3 months (around 02/11/2024).  Isaac Cross, DPM      [facility] LOCATION: [address] [place] [address]                   Office [phone]   [facility] LOCATION: [address] [place], [place] [address] Office [phone]

## 2023-11-17 ENCOUNTER — Encounter: Payer: Self-pay | Admitting: Family Medicine

## 2023-11-17 ENCOUNTER — Ambulatory Visit: Admitting: Family Medicine

## 2023-11-17 VITALS — BP 124/72 | HR 91 | Resp 16 | Ht 74.5 in | Wt 343.8 lb

## 2023-11-17 DIAGNOSIS — J302 Other seasonal allergic rhinitis: Secondary | ICD-10-CM

## 2023-11-17 DIAGNOSIS — J449 Chronic obstructive pulmonary disease, unspecified: Secondary | ICD-10-CM

## 2023-11-17 DIAGNOSIS — G4733 Obstructive sleep apnea (adult) (pediatric): Secondary | ICD-10-CM

## 2023-11-17 DIAGNOSIS — E2740 Unspecified adrenocortical insufficiency: Secondary | ICD-10-CM | POA: Diagnosis not present

## 2023-11-17 DIAGNOSIS — E1129 Type 2 diabetes mellitus with other diabetic kidney complication: Secondary | ICD-10-CM

## 2023-11-17 DIAGNOSIS — I10 Essential (primary) hypertension: Secondary | ICD-10-CM

## 2023-11-17 DIAGNOSIS — J3089 Other allergic rhinitis: Secondary | ICD-10-CM

## 2023-11-17 DIAGNOSIS — R809 Proteinuria, unspecified: Secondary | ICD-10-CM

## 2023-11-17 DIAGNOSIS — E785 Hyperlipidemia, unspecified: Secondary | ICD-10-CM

## 2023-11-17 DIAGNOSIS — R000 Tachycardia, unspecified: Secondary | ICD-10-CM

## 2023-11-17 DIAGNOSIS — I700 Atherosclerosis of aorta: Secondary | ICD-10-CM

## 2023-11-17 DIAGNOSIS — F418 Other specified anxiety disorders: Secondary | ICD-10-CM

## 2023-11-17 LAB — POCT GLYCOSYLATED HEMOGLOBIN (HGB A1C): Hemoglobin A1C: 6.8 % — AB (ref 4.0–5.6)

## 2023-11-17 MED ORDER — ROSUVASTATIN CALCIUM 20 MG PO TABS: 20.0000 mg | ORAL_TABLET | Freq: Every day | ORAL | 0 refills | Status: DC

## 2023-11-17 MED ORDER — PIOGLITAZONE HCL 15 MG PO TABS: 15.0000 mg | ORAL_TABLET | Freq: Every day | ORAL | 1 refills | Status: DC

## 2023-11-17 MED ORDER — ATENOLOL 25 MG PO TABS: 25.0000 mg | ORAL_TABLET | Freq: Every day | ORAL | 1 refills | Status: DC

## 2023-11-17 MED ORDER — DAPAGLIFLOZIN PRO-METFORMIN ER 5-1000 MG PO TB24: 2.0000 | ORAL_TABLET | Freq: Every day | ORAL | 1 refills | Status: DC

## 2023-11-17 MED ORDER — RYBELSUS 7 MG PO TABS: 1.0000 | ORAL_TABLET | Freq: Every day | ORAL | 1 refills | Status: DC

## 2023-11-17 MED ORDER — LISINOPRIL 5 MG PO TABS: 5.0000 mg | ORAL_TABLET | Freq: Every day | ORAL | 1 refills | Status: DC

## 2023-11-17 MED ORDER — CITALOPRAM HYDROBROMIDE 20 MG PO TABS: 20.0000 mg | ORAL_TABLET | Freq: Every day | ORAL | 1 refills | Status: DC

## 2023-11-17 NOTE — Progress Notes (Signed)
 Name: Isaac Cross   MRN: [id number]    DOB: [birth year]   Date:11/17/2023       Progress Note  Subjective  Chief Complaint  Chief Complaint  Patient presents with   Medical Management of Chronic Issues   Discussed the use of AI scribe software for clinical note transcription with the patient, who gave verbal consent to proceed.  History of Present Illness Isaac Cross is a 63 year old male with type 2 diabetes mellitus who presents for a four-month follow-up visit.  His current A1c is 6.8, an increase from 6.5 at the last visit, which he attributes to increased consumption of burritos. He is taking pioglitazone  15 mg daily, Xigduo  XR 08/998 mg twice daily, and Rybelsus , which he reports is getting a little easier on his stomach. No symptoms of uncontrolled diabetes such as increased thirst, hunger, or urination.  He has dyslipidemia and is taking rosuvastatin . He recently discovered that the olive oil blend he was using was mostly sunflower oil. He takes over-the-counter fish oil and is considering dietary changes to improve his omega-3 intake.  He has hypertension, well-controlled with lisinopril  5 mg daily, primarily for kidney protection. No chest pain, palpitations, or dizziness. He also has adrenal insufficiency, managed with fludrocortisone  0.1 mg daily and hydrocortisone  20 mg daily, with adjustments during illness.  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 He uses a CPAP machine approximately four nights a week, often forgetting to use it. No regular cough or shortness of breath unless in humid conditions.  He has a history of tachycardia, managed with atenolol , and denies any issues with this medication. He also has a history of reflux, which is currently well-managed with over-the-counter medications.  Family history is significant for his mother, who has recently developed kidney failure and is  undergoing dialysis. She has a history of hypertension and cancer.    Patient Active Problem List   Diagnosis Date Noted   Tachycardia 11/17/2023   Bilateral sensorineural hearing loss 09/16/2023   Perennial allergic rhinitis with seasonal variation 06/22/2022   Adrenal insufficiency (HCC) 03/23/2022   Microalbuminuria 03/23/2022   History of total knee arthroplasty 09/04/2018   Coronary atherosclerosis due to calcified coronary lesion 12/02/2017   Atherosclerosis of aorta (HCC) 06/17/2015   Fatty liver disease, nonalcoholic 06/17/2015   Renal cyst, right 06/17/2015   BPH (benign prostatic hyperplasia) 05/25/2015   Microscopic hematuria 05/25/2015   Second degree hemorrhoids    Diverticulosis of large intestine without diverticulitis    Allergic rhinitis 10/13/2014   ED (erectile dysfunction) of organic origin 07/22/2014   Gastro-esophageal reflux disease without esophagitis 07/22/2014   Benign hypertension 07/22/2014   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HCC) 07/22/2014   Adult BMI 30+ 07/22/2014   Chronic obstructive pulmonary disease (HCC) 07/22/2014   Depression with anxiety 07/22/2014   Dyslipidemia 07/22/2014   Genital herpes 07/22/2014   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   Osteoarthritis of both knees 07/22/2014   OSA and COPD overlap syndrome (HCC) 05/14/2013   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    Past Surgical History:  Procedure Laterality Date   BLADDER REPAIR     COLONOSCOPY WITH PROPOFOL  N/A 04/07/2015   Procedure: COLONOSCOPY WITH PROPOFOL ;  Surgeon: Rogelia Copping, MD;  Location: [facility];  Service: Endoscopy;  Laterality: N/A;  Diabetic - oral meds CPAP   HAND SURGERY Left    4TH AND 5TH FINGER  I & D KNEE WITH POLY EXCHANGE Right 09/18/2018   Procedure: IRRIGATION AND DEBRIDEMENT KNEE WITH POLY EXCHANGE;  Surgeon: Leora Lynwood SAUNDERS, MD;  Location: [facility];  Service: Orthopedics;  Laterality: Right;   KNEE ARTHROSCOPY Right     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   TOTAL KNEE ARTHROPLASTY Left 09/17/2018   Procedure: I &D left knee and polyethylene exchange;  Surgeon: Leora Lynwood SAUNDERS, MD;  Location: [facility];  Service: Orthopedics;  Laterality: Left;   TRANSURETHRAL RESECTION OF PROSTATE N/A 09/28/2021   Procedure: TRANSURETHRAL RESECTION OF THE PROSTATE (TURP);  Surgeon: Penne Knee, MD;  Location: [facility];  Service: Urology;  Laterality: N/A;   TRANSURETHRAL RESECTION OF PROSTATE  09/28/2021   VEIN SURGERY Right     Family History  Problem Relation Age of Onset   Breast cancer Mother 32   Cancer Mother 77       breast   Hypertension Mother    Diabetes Mother    Cancer Father        skin cancer   Diabetes Father    Hypertension Father    Seizures Daughter    Lupus Daughter    Breast cancer Maternal Uncle    Cancer Maternal Uncle        breast cancer   Cancer Maternal Uncle 45       prostate   Cancer Maternal Uncle 34       lung   Cancer Maternal Grandfather        Prostate   Diabetes Paternal Grandfather    Bladder Cancer Neg Hx    Kidney cancer Neg Hx     Social History   Tobacco Use   Smoking status: Former    Current packs/day: 0.00    Average packs/day: 1 pack/day for 10.0 years (10.0 ttl pk-yrs)    Types: Cigarettes    Start date: 04/20/1995    Quit date: 04/19/2005    Years since quitting: 18.5   Smokeless tobacco: Never  Substance Use Topics   Alcohol use: Yes    Alcohol/week: 10.0 standard drinks of alcohol    Types: 10 Cans of beer per week    Comment: occasional     Current Outpatient Medications:    acetic acid -hydrocortisone  (VOSOL -HC) OTIC solution, Place 3 drops into both ears 2 (two) times daily., Disp: 10 mL, Rfl: 0   albuterol  (VENTOLIN  HFA) 108 (90 Base) MCG/ACT inhaler, INHALE 2 PUFFS BY MOUTH EVERY 6 HOURS AS NEEDED, Disp: 8.5 each, Rfl: 0   atenolol  (TENORMIN ) 25 MG tablet, Take 1 tablet (25 mg total) by mouth daily., Disp: 90 tablet, Rfl:  0   Azelastine -Fluticasone  137-50 MCG/ACT SUSP, Place 1 spray into the nose every 12 (twelve) hours., Disp: 23 g, Rfl: 2   citalopram  (CELEXA ) 20 MG tablet, TAKE 1 TABLET EVERY DAY, Disp: 90 tablet, Rfl: 0   Dapagliflozin  Pro-metFORMIN  ER (XIGDUO  XR) 08-998 MG TB24, Take 2 tablets by mouth daily., Disp: 180 tablet, Rfl: 0   diclofenac  Sodium (VOLTAREN ) 1 % GEL, Apply 4 g topically 4 (four) times daily. (Patient taking differently: Apply 4 g topically as needed.), Disp: 300 g, Rfl: 1   famotidine  (PEPCID ) 20 MG tablet, Take 20 mg by mouth 2 (two) times daily., Disp: , Rfl:    fludrocortisone  (FLORINEF ) 0.1 MG tablet, Take 0.1 mg by mouth daily. Take two daily if sick, Disp: , Rfl:    fluticasone  furoate-vilanterol (BREO ELLIPTA ) 100-25 MCG/ACT AEPB, Inhale 1 puff into the lungs daily.,  Disp: 180 each, Rfl: 1   hydrocortisone  (CORTEF ) 20 MG tablet, Take 20 mg by mouth daily. 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   ROS  Ten systems reviewed and is negative except as mentioned in HPI    Objective Physical Exam  CONSTITUTIONAL: Patient appears well-developed and well-nourished. No distress. HEENT: Head  atraumatic, normocephalic, neck supple. CARDIOVASCULAR: Normal rate, regular rhythm and normal heart sounds. No murmur heard. 1+ edema in extremities. PULMONARY: Effort normal and breath sounds normal. Lungs clear to auscultation bilaterally. No respiratory distress. ABDOMINAL: There is no tenderness or distention. MUSCULOSKELETAL: Normal gait. Without gross motor or sensory deficit. PSYCHIATRIC: Patient has a normal mood and affect. Behavior is normal. Judgment and thought content normal.  Vitals:   11/17/23 0757  BP: 124/72  Pulse: 91  Resp: 16  SpO2: 100%  Weight: (!) 343 lb 12.8 oz (155.9 kg)  Height: 6' 2.5 (1.892 m)    Body mass index is 43.55 kg/m.  Recent Results (from the past 2160 hours)  POCT HgB A1C     Status: Abnormal   Collection Time: 11/17/23  8:02 AM  Result Value Ref Range   Hemoglobin A1C 6.8 (A) 4.0 - 5.6 %   HbA1c POC (<> result, manual entry)     HbA1c, POC (prediabetic range)     HbA1c, POC (controlled diabetic range)      PHQ2/9:    11/17/2023    7:51 AM 09/16/2023    9:17 AM 07/18/2023    8:10 AM 04/19/2023    7:37 AM 12/30/2022    8:00 AM  Depression screen PHQ 2/9  Decreased Interest 0 0 0 0 0  Down, Depressed, Hopeless 0 0 0 0 0  PHQ - 2 Score 0 0 0 0 0  Altered sleeping  0 0  0  Tired, decreased energy  0 0  0  Change in appetite  0 0  0  Feeling bad or failure about yourself   0 0  0  Trouble concentrating  0 0  0  Moving slowly or fidgety/restless     0  Suicidal thoughts  0 0  0  PHQ-9 Score  0 0  0  Difficult doing work/chores  Not difficult at all Not difficult at all  Not difficult at all    phq 9 is negative  Fall Risk:    11/17/2023    7:51 AM 09/16/2023    9:17 AM 07/18/2023    8:10 AM 04/19/2023    7:37 AM 12/30/2022    8:00 AM  Fall Risk   Falls in  the past year? 0 0 0 0 0  Number falls in past yr: 0 0 0 0 0  Injury with Fall? 0 0 0 0 0  Risk for fall due to : No Fall Risks No Fall Risks  No Fall Risks   Follow up  Falls evaluation completed Falls prevention discussed;Education provided;Falls evaluation completed Falls evaluation completed Falls evaluation completed       Assessment & Plan Type 2 diabetes mellitus with dyslipidemia, obesity and HTN A1c increased to 6.8, indicating slight worsening of glycemic control. Rybelsus  better tolerated, no dose increase due to past side effects. High carbohydrate intake may contribute to A1c rise. - Continue pioglitazone  15 mg daily. - Continue Xigduo  XR 08/998 mg twice daily. - Continue Rybelsus  at current dose. - Advise on dietary modifications to reduce carbohydrate intake, particularly from tortillas and rice. - Stop diabetes medications 3-4 days before colonoscopy as instructed.  Dyslipidemia Previous LDL increase noted. Current treatment includes rosuvastatin  and plans to switch to pure olive oil. - Continue rosuvastatin  20 mg daily. - Stop over-the-counter fish oil. - Switch to pure olive oil for cooking. - Incorporate tree nuts, olives, and fish into diet for omega-3 intake.  Adrenal insufficiency Well-managed on fludrocortisone  and hydrocortisone . Blood pressure stable with lisinopril  for kidney protection. - Continue fludrocortisone  0.1 mg daily. - Continue hydrocortisone  20 mg daily, with option to double dose if ill. - Continue lisinopril  5 mg daily for kidney protection.  Obstructive sleep apnea with COPD overlap syndrome Uses Breo as needed. CPAP usage inconsistent, approximately four nights per week. - Use Breo as needed for COPD symptoms. - Increase CPAP usage to nightly by establishing a routine. - Consider setting an alarm or reminder for CPAP use.  Hypertension Blood pressure well-controlled at 124/72 mmHg. Lisinopril  used for kidney protection. - Continue lisinopril  5 mg daily.  Tachycardia Heart rate stable at 91 bpm with atenolol . - Continue atenolol  25 mg daily.  Depression - Continue citalopram  20 mg daily.  Obesity  Morbid  BMI over 40, weight stable at 343.8 lbs. - Advise on dietary modifications to reduce carbohydrate intake and focus on clean eating.  Lower extremity edema Mild edema noted, 1+ pitting.

## 2023-11-29 ENCOUNTER — Encounter
Admission: RE | Disposition: A | Discharge status: Home / Self Care | Payer: Self-pay | Source: Home / Self Care | Attending: Gastroenterology

## 2023-11-29 ENCOUNTER — Other Ambulatory Visit: Payer: Self-pay

## 2023-11-29 ENCOUNTER — Encounter: Payer: Self-pay | Admitting: Gastroenterology

## 2023-11-29 ENCOUNTER — Ambulatory Visit
Admission: RE | Admit: 2023-11-29 | Discharge: 2023-11-29 | Disposition: A | Discharge status: Home / Self Care | Attending: Gastroenterology | Admitting: Gastroenterology

## 2023-11-29 ENCOUNTER — Ambulatory Visit: Admitting: Certified Registered Nurse Anesthetist

## 2023-11-29 DIAGNOSIS — Z7984 Long term (current) use of oral hypoglycemic drugs: Secondary | ICD-10-CM | POA: Diagnosis not present

## 2023-11-29 DIAGNOSIS — Z6841 Body Mass Index (BMI) 40.0 and over, adult: Secondary | ICD-10-CM | POA: Insufficient documentation

## 2023-11-29 DIAGNOSIS — E119 Type 2 diabetes mellitus without complications: Secondary | ICD-10-CM | POA: Diagnosis not present

## 2023-11-29 DIAGNOSIS — J4489 Other specified chronic obstructive pulmonary disease: Secondary | ICD-10-CM | POA: Insufficient documentation

## 2023-11-29 DIAGNOSIS — Z7985 Long-term (current) use of injectable non-insulin antidiabetic drugs: Secondary | ICD-10-CM | POA: Diagnosis not present

## 2023-11-29 DIAGNOSIS — Z87891 Personal history of nicotine dependence: Secondary | ICD-10-CM | POA: Insufficient documentation

## 2023-11-29 DIAGNOSIS — F419 Anxiety disorder, unspecified: Secondary | ICD-10-CM | POA: Diagnosis not present

## 2023-11-29 DIAGNOSIS — E66813 Obesity, class 3: Secondary | ICD-10-CM | POA: Diagnosis not present

## 2023-11-29 DIAGNOSIS — Z1211 Encounter for screening for malignant neoplasm of colon: Secondary | ICD-10-CM | POA: Diagnosis not present

## 2023-11-29 DIAGNOSIS — K5730 Diverticulosis of large intestine without perforation or abscess without bleeding: Secondary | ICD-10-CM

## 2023-11-29 DIAGNOSIS — Z09 Encounter for follow-up examination after completed treatment for conditions other than malignant neoplasm: Secondary | ICD-10-CM | POA: Diagnosis not present

## 2023-11-29 DIAGNOSIS — G4730 Sleep apnea, unspecified: Secondary | ICD-10-CM | POA: Diagnosis not present

## 2023-11-29 DIAGNOSIS — F32A Depression, unspecified: Secondary | ICD-10-CM | POA: Insufficient documentation

## 2023-11-29 DIAGNOSIS — I10 Essential (primary) hypertension: Secondary | ICD-10-CM | POA: Diagnosis not present

## 2023-11-29 DIAGNOSIS — K640 First degree hemorrhoids: Secondary | ICD-10-CM | POA: Diagnosis not present

## 2023-11-29 DIAGNOSIS — I2510 Atherosclerotic heart disease of native coronary artery without angina pectoris: Secondary | ICD-10-CM | POA: Insufficient documentation

## 2023-11-29 HISTORY — PX: COLONOSCOPY: SHX5424

## 2023-11-29 LAB — GLUCOSE, CAPILLARY: Glucose-Capillary: 126 mg/dL — ABNORMAL HIGH (ref 70–99)

## 2023-11-29 SURGERY — COLONOSCOPY
Anesthesia: General

## 2023-11-29 MED ORDER — LIDOCAINE HCL (PF) 2 % IJ SOLN
INTRAMUSCULAR | Status: AC
  Filled 2023-11-29: qty 5

## 2023-11-29 MED ORDER — PROPOFOL 500 MG/50ML IV EMUL
INTRAVENOUS | Status: DC | PRN
  Administered 2023-11-29 (×2): 150 ug/kg/min via INTRAVENOUS

## 2023-11-29 MED ORDER — SODIUM CHLORIDE 0.9 % IV SOLN: INTRAVENOUS | Status: DC

## 2023-11-29 MED ORDER — PROPOFOL 10 MG/ML IV BOLUS
INTRAVENOUS | Status: DC | PRN
  Administered 2023-11-29: 40 mg via INTRAVENOUS
  Administered 2023-11-29: 80 mg via INTRAVENOUS
  Administered 2023-11-29: 20 mg via INTRAVENOUS
  Administered 2023-11-29: 40 mg via INTRAVENOUS
  Administered 2023-11-29: 80 mg via INTRAVENOUS
  Administered 2023-11-29: 20 mg via INTRAVENOUS

## 2023-11-29 MED ORDER — PROPOFOL 1000 MG/100ML IV EMUL
INTRAVENOUS | Status: AC
  Filled 2023-11-29: qty 100

## 2023-11-29 MED ORDER — GLYCOPYRROLATE 0.2 MG/ML IJ SOLN
INTRAMUSCULAR | Status: AC
  Filled 2023-11-29: qty 1

## 2023-11-29 MED ORDER — PROPOFOL 1000 MG/100ML IV EMUL
INTRAVENOUS | Status: AC
  Filled 2023-11-29: qty 200

## 2023-11-29 MED ORDER — DEXMEDETOMIDINE HCL IN NACL 80 MCG/20ML IV SOLN
INTRAVENOUS | Status: DC | PRN
Start: 2023-11-29 — End: 2023-11-29
  Administered 2023-11-29 (×2): 8 ug via INTRAVENOUS

## 2023-11-29 MED ORDER — DEXMEDETOMIDINE HCL IN NACL 80 MCG/20ML IV SOLN
INTRAVENOUS | Status: AC
  Filled 2023-11-29: qty 20

## 2023-11-29 NOTE — Transfer of Care (Signed)
 Immediate Anesthesia Transfer of Care Note  Patient: Isaac Cross  Procedure(s) Performed: COLONOSCOPY  Patient Location: PACU  Anesthesia Type:General  Level of Consciousness: drowsy  Airway & Oxygen Therapy: Patient Spontanous Breathing and Patient connected to face mask oxygen  Post-op Assessment: Report given to RN and Post -op Vital signs reviewed and stable  Post vital signs: Reviewed and stable  Last Vitals:  Vitals Value Taken Time  BP    Temp    Pulse 88 11/29/23 08:03  Resp 18 11/29/23 08:03  SpO2 95 % 11/29/23 08:03  Vitals shown include unfiled device data.  Last Pain:  Vitals:   11/29/23 0715  TempSrc: Temporal  PainSc: 0-No pain         Complications: No notable events documented.

## 2023-11-29 NOTE — Anesthesia Preprocedure Evaluation (Addendum)
 Anesthesia Evaluation  Patient identified by MRN, date of birth, ID band Patient awake    Reviewed: Allergy & Precautions, NPO status , Patient's Chart, lab work & pertinent test results  History of Anesthesia Complications Negative for: history of anesthetic complications  Airway Mallampati: III   Neck ROM: Full    Dental  (+) Missing   Pulmonary asthma , sleep apnea and Continuous Positive Airway Pressure Ventilation , COPD, former smoker (quit 2007)   Pulmonary exam normal breath sounds clear to auscultation       Cardiovascular hypertension, + CAD  Normal cardiovascular exam Rhythm:Regular Rate:Normal  ECG 09/16/23: ST with PACs; LAD; low voltage   Neuro/Psych  PSYCHIATRIC DISORDERS Anxiety Depression    negative neurological ROS     GI/Hepatic ,GERD  ,,  Endo/Other  diabetes, Type 2  Class 3 obesity  Renal/GU Renal disease (CKD)     Musculoskeletal  (+) Arthritis ,    Abdominal   Peds  Hematology  (+) Blood dyscrasia, anemia   Anesthesia Other Findings   Reproductive/Obstetrics                              Anesthesia Physical Anesthesia Plan  ASA: 3  Anesthesia Plan: General   Post-op Pain Management:    Induction: Intravenous  PONV Risk Score and Plan: 2 and Propofol  infusion, TIVA and Treatment may vary due to age or medical condition  Airway Management Planned: Natural Airway  Additional Equipment:   Intra-op Plan:   Post-operative Plan:   Informed Consent: I have reviewed the patients History and Physical, chart, labs and discussed the procedure including the risks, benefits and alternatives for the proposed anesthesia with the patient or authorized representative who has indicated his/her understanding and acceptance.       Plan Discussed with: CRNA  Anesthesia Plan Comments: (LMA/GETA backup discussed.  Patient consented for risks of anesthesia including  but not limited to:  - adverse reactions to medications - damage to eyes, teeth, lips or other oral mucosa - nerve damage due to positioning  - sore throat or hoarseness - damage to heart, brain, nerves, lungs, other parts of body or loss of life  Informed patient about role of CRNA in peri- and intra-operative care.  Patient voiced understanding.)         Anesthesia Quick Evaluation

## 2023-11-29 NOTE — Op Note (Signed)
 [facility] Gastroenterology Patient Name: Isaac Cross Procedure Date: 11/29/2023 7:09 AM MRN: [id number] Account #: [id number] Date of Birth: 12/31/1960 Admit Type: Outpatient Age: 63 Room: [facility] ENDO ROOM 4 Gender: Male Note Status: Finalized Instrument Name: Colon Scope [id number] Procedure:             Colonoscopy Indications:           Screening for colorectal malignant neoplasm Providers:             Rogelia Copping MD, MD Referring MD:          Rogelia Copping MD, MD (Referring MD), Dorette FALCON. Sowles,                         MD (Referring MD) Medicines:             Propofol  per Anesthesia Complications:         No immediate complications. Procedure:             Pre-Anesthesia Assessment:                        - Prior to the procedure, a History and Physical was                         performed, and patient medications and allergies were                         reviewed. The patient's tolerance of previous                         anesthesia was also reviewed. The risks and benefits                         of the procedure and the sedation options and risks                         were discussed with the patient. All questions were                         answered, and informed consent was obtained. Prior                         Anticoagulants: The patient has taken no anticoagulant                         or antiplatelet agents. ASA Grade Assessment: II - A                         patient with mild systemic disease. After reviewing                         the risks and benefits, the patient was deemed in                         satisfactory condition to undergo the procedure.                        After obtaining informed consent, the colonoscope was  passed under direct vision. Throughout the procedure,                         the patient's blood pressure, pulse, and oxygen                         saturations were monitored continuously. The                          Colonoscope was introduced through the anus and                         advanced to the the cecum, identified by appendiceal                         orifice and ileocecal valve. The colonoscopy was                         performed without difficulty. The patient tolerated                         the procedure well. The quality of the bowel                         preparation was excellent. Findings:      The perianal and digital rectal examinations were normal.      Multiple small-mouthed diverticula were found in the entire colon.      Non-bleeding internal hemorrhoids were found during retroflexion. The       hemorrhoids were Grade I (internal hemorrhoids that do not prolapse). Impression:            - Diverticulosis in the entire examined colon.                        - Non-bleeding internal hemorrhoids.                        - No specimens collected. Recommendation:        - Discharge patient to home.                        - Resume previous diet.                        - Continue present medications.                        - Repeat colonoscopy in 10 years for screening                         purposes. Procedure Code(s):     --- Professional ---                        [phone], Colonoscopy, flexible; diagnostic, including                         collection of specimen(s) by brushing or washing, when                         performed (separate procedure)  Diagnosis Code(s):     --- Professional ---                        Z12.11, Encounter for screening for malignant neoplasm                         of colon CPT copyright 2022 American Medical Association. All rights reserved. The codes documented in this report are preliminary and upon coder review may  be revised to meet current compliance requirements. Rogelia Copping MD, MD 11/29/2023 8:02:07 AM This report has been signed electronically. Number of Addenda: 0 Note Initiated On: 11/29/2023 7:09 AM Scope  Withdrawal Time: 0 hours 7 minutes 43 seconds  Total Procedure Duration: 0 hours 12 minutes 25 seconds  Estimated Blood Loss:  Estimated blood loss: none.      [facility]

## 2023-11-29 NOTE — Anesthesia Postprocedure Evaluation (Signed)
 Anesthesia Post Note  Patient: Isaac Cross  Procedure(s) Performed: COLONOSCOPY  Patient location during evaluation: PACU Anesthesia Type: General Level of consciousness: awake and alert, oriented and patient cooperative Pain management: pain level controlled Vital Signs Assessment: post-procedure vital signs reviewed and stable Respiratory status: spontaneous breathing, nonlabored ventilation and respiratory function stable Cardiovascular status: blood pressure returned to baseline and stable Postop Assessment: adequate PO intake Anesthetic complications: no   No notable events documented.   Last Vitals:  Vitals:   11/29/23 0804 11/29/23 0818  BP: (!) 85/62 102/77  Pulse:    Resp:    Temp:  (!) 36.1 C  SpO2:      Last Pain:  Vitals:   11/29/23 0833  TempSrc:   PainSc: 0-No pain                 Alfonso Ruths

## 2023-11-29 NOTE — Anesthesia Procedure Notes (Addendum)
 Date/Time: 11/29/2023 7:45 AM  Performed by: Duwayne Craven, CRNAPre-anesthesia Checklist: Patient identified, Emergency Drugs available, Suction available, Patient being monitored and Timeout performed Patient Re-evaluated:Patient Re-evaluated prior to induction Oxygen Delivery Method: Simple face mask Induction Type: IV induction Ventilation: Oral airway inserted - appropriate to patient size Placement Confirmation: CO2 detector and positive ETCO2

## 2023-11-29 NOTE — H&P (Signed)
 Rogelia Copping, MD [facility] [address], [address] [place], [place] [address] Phone: [phone] Fax : [phone]  Primary Care Physician:  Sowles, Krichna, MD Primary Gastroenterologist:  Dr. Copping  Pre-Procedure History & Physical: HPI:  Isaac Cross is a 63 y.o. male is here for a screening colonoscopy.   Past Medical History:  Diagnosis Date   Allergy    Anemia    Anxiety    Arthritis    knees   Asthma, mild intermittent, well-controlled    Chronic obstructive asthma (HCC)    Chronic obstructive pulmonary disease (HCC) 07/22/2014   COPD (chronic obstructive pulmonary disease) (HCC)    Depression with anxiety    Diabetes (HCC)    GERD (gastroesophageal reflux disease)    HBP (high blood pressure)    High cholesterol    Renal cyst, right    Sleep apnea    CPAP   Swelling     Past Surgical History:  Procedure Laterality Date   BLADDER REPAIR     COLONOSCOPY WITH PROPOFOL  N/A 04/07/2015   Procedure: COLONOSCOPY WITH PROPOFOL ;  Surgeon: Rogelia Copping, MD;  Location: [facility];  Service: Endoscopy;  Laterality: N/A;  Diabetic - oral meds CPAP   HAND SURGERY Left    4TH AND 5TH FINGER   I & D KNEE WITH POLY EXCHANGE Right 09/18/2018   Procedure: IRRIGATION AND DEBRIDEMENT KNEE WITH POLY EXCHANGE;  Surgeon: Leora Lynwood SAUNDERS, MD;  Location: [facility];  Service: Orthopedics;  Laterality: Right;   KNEE ARTHROSCOPY Right    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   TOTAL KNEE ARTHROPLASTY Left 09/17/2018   Procedure: I &D left knee and polyethylene exchange;  Surgeon: Leora Lynwood SAUNDERS, MD;  Location: [facility];  Service: Orthopedics;  Laterality: Left;   TRANSURETHRAL RESECTION OF PROSTATE N/A 09/28/2021   Procedure: TRANSURETHRAL RESECTION OF THE PROSTATE (TURP);  Surgeon: Penne Knee, MD;  Location: [facility];  Service: Urology;  Laterality: N/A;   TRANSURETHRAL RESECTION OF PROSTATE  09/28/2021   VEIN SURGERY Right     Prior to Admission  medications   Medication Sig Start Date End Date Taking? Authorizing Provider  atenolol  (TENORMIN ) 25 MG tablet Take 1 tablet (25 mg total) by mouth daily. 11/17/23  Yes Sowles, Krichna, MD  citalopram  (CELEXA ) 20 MG tablet Take 1 tablet (20 mg total) by mouth daily. 11/17/23  Yes Sowles, Krichna, MD  famotidine  (PEPCID ) 20 MG tablet Take 20 mg by mouth 2 (two) times daily.   Yes [provider]  fludrocortisone  (FLORINEF ) 0.1 MG tablet Take 0.1 mg by mouth daily. Take two daily if sick   Yes Cherilyn Debby CROME, MD  hydrocortisone  (CORTEF ) 20 MG tablet Take 20 mg by mouth daily. Take two daily if sick   Yes Cherilyn Debby CROME, MD  lisinopril  (ZESTRIL ) 5 MG tablet Take 1 tablet (5 mg total) by mouth daily. 11/17/23  Yes Sowles, Krichna, MD  Multiple Vitamins-Minerals (MENS MULTIVITAMIN PLUS) TABS Take by mouth.   Yes [provider]  pioglitazone  (ACTOS ) 15 MG tablet Take 1 tablet (15 mg total) by mouth daily. 11/17/23  Yes Sowles, Krichna, MD  rosuvastatin  (CRESTOR ) 20 MG tablet Take 1 tablet (20 mg total) by mouth daily. 11/17/23  Yes Sowles, Krichna, MD  Semaglutide  (RYBELSUS ) 7 MG TABS Take 1 tablet (7 mg total) by mouth daily. 11/17/23  Yes Sowles, Krichna, MD  acetic acid -hydrocortisone  (VOSOL -HC) OTIC solution Place 3 drops into both ears 2 (two) times daily. 05/05/22  Sowles, Krichna, MD  albuterol  (VENTOLIN  HFA) 108 (90 Base) MCG/ACT inhaler INHALE 2 PUFFS BY MOUTH EVERY 6 HOURS AS NEEDED 03/26/22   Sowles, Krichna, MD  Azelastine -Fluticasone  137-50 MCG/ACT SUSP Place 1 spray into the nose every 12 (twelve) hours. 04/16/21   Sowles, Krichna, MD  Dapagliflozin  Pro-metFORMIN  ER (XIGDUO  XR) 08-998 MG TB24 Take 2 tablets by mouth daily. 11/17/23   Sowles, Krichna, MD  diclofenac  Sodium (VOLTAREN ) 1 % GEL Apply 4 g topically 4 (four) times daily. Patient taking differently: Apply 4 g topically as needed. 07/25/20   Sowles, Krichna, MD  fluticasone  furoate-vilanterol (BREO ELLIPTA )  100-25 MCG/ACT AEPB Inhale 1 puff into the lungs daily. 07/20/23   Sowles, Krichna, MD    Allergies as of 09/21/2023 - Review Complete 09/21/2023  Allergen Reaction Noted   Heparin   09/26/2018   Lipitor [atorvastatin] Hives and Itching 03/02/2013   Other  08/29/2017   Viagra  [sildenafil citrate]  07/20/2014    Family History  Problem Relation Age of Onset   Breast cancer Mother 89   Cancer Mother 86       breast   Hypertension Mother    Diabetes Mother    Cancer Father        skin cancer   Diabetes Father    Hypertension Father    Seizures Daughter    Lupus Daughter    Breast cancer Maternal Uncle    Cancer Maternal Uncle        breast cancer   Cancer Maternal Uncle 77       prostate   Cancer Maternal Uncle 83       lung   Cancer Maternal Grandfather        Prostate   Diabetes Paternal Grandfather    Bladder Cancer Neg Hx    Kidney cancer Neg Hx     Social History   Socioeconomic History   Marital status: Married    Spouse name: Berwyn   Number of children: 2   Years of education: Not on file   Highest education level: Bachelor's degree (e.g., BA, AB, BS)  Occupational History   Occupation: case Production designer, theatre/television/film   Tobacco Use   Smoking status: Former    Current packs/day: 0.00    Average packs/day: 1 pack/day for 10.0 years (10.0 ttl pk-yrs)    Types: Cigarettes    Start date: 04/20/1995    Quit date: 04/19/2005    Years since quitting: 18.6   Smokeless tobacco: Never  Vaping Use   Vaping status: Never Used  Substance and Sexual Activity   Alcohol use: Yes    Alcohol/week: 10.0 standard drinks of alcohol    Types: 10 Cans of beer per week    Comment: occasional   Drug use: No   Sexual activity: Yes    Partners: Female  Other Topics Concern   Not on file  Social History Narrative   Lives at home with wife, using a walker since his surgery recently.   Social Drivers of Corporate investment banker Strain: Low Risk  (09/16/2023)   Overall Financial Resource  Strain (CARDIA)    Difficulty of Paying Living Expenses: Not hard at all  Food Insecurity: No Food Insecurity (09/16/2023)   Hunger Vital Sign    Worried About Running Out of Food in the Last Year: Never true    Ran Out of Food in the Last Year: Never true  Transportation Needs: No Transportation Needs (09/16/2023)   PRAPARE - Transportation  Lack of Transportation (Medical): No    Lack of Transportation (Non-Medical): No  Physical Activity: Sufficiently Active (09/16/2023)   Exercise Vital Sign    Days of Exercise per Week: 5 days    Minutes of Exercise per Session: 30 min  Stress: No Stress Concern Present (09/16/2023)   [employer] of Occupational Health - Occupational Stress Questionnaire    Feeling of Stress : Not at all  Social Connections: Moderately Isolated (09/16/2023)   Social Connection and Isolation Panel    Frequency of Communication with Friends and Family: More than three times a week    Frequency of Social Gatherings with Friends and Family: More than three times a week    Attends Religious Services: Never    Database administrator or Organizations: No    Attends Banker Meetings: Never    Marital Status: Married  Catering manager Violence: Not At Risk (09/16/2023)   Humiliation, Afraid, Rape, and Kick questionnaire    Fear of Current or Ex-Partner: No    Emotionally Abused: No    Physically Abused: No    Sexually Abused: No    Review of Systems: See HPI, otherwise negative ROS  Physical Exam: BP 126/84   Pulse 94   Temp (!) 96.8 F (36 C) (Temporal)   Resp 14   Ht 6' 2.49 (1.892 m)   Wt (!) 155.1 kg   SpO2 98%   BMI 43.34 kg/m  General:   Alert,  pleasant and cooperative in NAD Head:  Normocephalic and atraumatic. Neck:  Supple; no masses or thyromegaly. Lungs:  Clear throughout to auscultation.    Heart:  Regular rate and rhythm. Abdomen:  Soft, nontender and nondistended. Normal bowel sounds, without guarding, and without  rebound.   Neurologic:  Alert and  oriented x4;  grossly normal neurologically.  Impression/Plan: Isaac Cross is now here to undergo a screening colonoscopy.  Risks, benefits, and alternatives regarding colonoscopy have been reviewed with the patient.  Questions have been answered.  All parties agreeable.

## 2023-11-30 ENCOUNTER — Encounter: Payer: Self-pay | Admitting: Gastroenterology

## 2023-12-15 ENCOUNTER — Other Ambulatory Visit: Payer: Self-pay | Admitting: Family Medicine

## 2023-12-15 DIAGNOSIS — J449 Chronic obstructive pulmonary disease, unspecified: Secondary | ICD-10-CM

## 2023-12-28 ENCOUNTER — Other Ambulatory Visit: Payer: Self-pay | Admitting: Family Medicine

## 2023-12-28 DIAGNOSIS — Z23 Encounter for immunization: Secondary | ICD-10-CM

## 2023-12-28 MED ORDER — COVID-19 MRNA VAC-TRIS(PFIZER) 30 MCG/0.3ML IM SUSY: 0.3000 mL | PREFILLED_SYRINGE | Freq: Once | INTRAMUSCULAR | 0 refills | Status: AC

## 2023-12-28 MED ORDER — COVID-19 MRNA VACC (MODERNA) 50 MCG/0.5ML IM SUSY: 0.5000 mL | PREFILLED_SYRINGE | Freq: Once | INTRAMUSCULAR | 0 refills | Status: AC

## 2024-01-16 ENCOUNTER — Other Ambulatory Visit: Payer: Self-pay | Admitting: Family Medicine

## 2024-01-16 DIAGNOSIS — J449 Chronic obstructive pulmonary disease, unspecified: Secondary | ICD-10-CM

## 2024-01-27 ENCOUNTER — Ambulatory Visit (INDEPENDENT_AMBULATORY_CARE_PROVIDER_SITE_OTHER)

## 2024-01-27 ENCOUNTER — Ambulatory Visit

## 2024-01-27 DIAGNOSIS — Z23 Encounter for immunization: Secondary | ICD-10-CM | POA: Diagnosis not present

## 2024-02-07 ENCOUNTER — Other Ambulatory Visit: Payer: Self-pay | Admitting: Emergency Medicine

## 2024-02-07 DIAGNOSIS — E119 Type 2 diabetes mellitus without complications: Secondary | ICD-10-CM

## 2024-02-07 DIAGNOSIS — E1129 Type 2 diabetes mellitus with other diabetic kidney complication: Secondary | ICD-10-CM

## 2024-02-09 ENCOUNTER — Other Ambulatory Visit: Payer: Self-pay | Admitting: Family Medicine

## 2024-02-09 DIAGNOSIS — I700 Atherosclerosis of aorta: Secondary | ICD-10-CM

## 2024-02-09 DIAGNOSIS — E785 Hyperlipidemia, unspecified: Secondary | ICD-10-CM

## 2024-02-10 ENCOUNTER — Ambulatory Visit: Admitting: Podiatry

## 2024-02-10 DIAGNOSIS — B351 Tinea unguium: Secondary | ICD-10-CM

## 2024-02-10 DIAGNOSIS — Q828 Other specified congenital malformations of skin: Secondary | ICD-10-CM | POA: Diagnosis not present

## 2024-02-10 DIAGNOSIS — E1129 Type 2 diabetes mellitus with other diabetic kidney complication: Secondary | ICD-10-CM

## 2024-02-10 DIAGNOSIS — M79675 Pain in left toe(s): Secondary | ICD-10-CM | POA: Diagnosis not present

## 2024-02-10 DIAGNOSIS — M79674 Pain in right toe(s): Secondary | ICD-10-CM | POA: Diagnosis not present

## 2024-02-17 ENCOUNTER — Encounter: Payer: Self-pay | Admitting: Podiatry

## 2024-02-17 NOTE — Progress Notes (Signed)
  Subjective:  Patient ID: Isaac Cross, male    DOB: 03-May-1960,  MRN: [id number]  Isaac Cross presents to clinic today for preventative diabetic foot care and painful porokeratotic lesion(s) left lower extremity and painful mycotic toenails that limit ambulation. Painful toenails interfere with ambulation. Aggravating factors include wearing enclosed shoe gear. Pain is relieved with periodic professional debridement. Painful porokeratotic lesions are aggravated when weightbearing with and without shoegear. Pain is relieved with periodic professional debridement.  Chief Complaint  Patient presents with   Toe Pain    Diabetic foot care. Dr. Glenard is his PCP. Last visit was in July. A1c is 6.8   New problem(s): None.   PCP is Sowles, Krichna, MD.  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  Objective:  There were no vitals filed for this visit. Isaac Cross is a pleasant 63 y.o. male morbidly obese in NAD. AAO x 3.  Vascular Examination: Vascular status intact b/l with palpable pedal pulses. Pedal hair present. CFT immediate b/l. No edema. No pain with calf compression b/l. Skin temperature gradient WNL b/l. No cyanosis or clubbing noted b/l LE.  Neurological Examination: Sensation grossly intact b/l with 10 gram monofilament. Vibratory sensation intact b/l.   Dermatological Examination: Pedal skin with normal turgor, texture and tone b/l. Toenails 1-5 b/l thick, discolored, elongated with subungual debris and pain on dorsal palpation. Porokeratotic lesion(s) sub 5th met base left foot. No erythema, no edema, no drainage, no fluctuance.   Musculoskeletal Examination: Muscle strength 5/5 to b/l LE. No pain, crepitus or joint limitation noted with ROM bilateral LE.  No gross bony deformities bilaterally.  Radiographs: None Assessment/Plan: 1. Pain due to onychomycosis of toenails of both feet   2. Porokeratosis   3. Type 2 diabetes mellitus with other diabetic kidney complication [facility])   Consent given for treatment. Patient examined. All patient's and/or POA's questions/concerns addressed on today's visit. Toenails 1-5 b/l debrided in length and girth without incident. Porokeratotic lesion(s) sub 5th met base left foot pared and enucleated with sharp debridement without incident. Continue foot and shoe inspections daily. Monitor blood glucose per PCP/Endocrinologist's recommendations.Continue soft, supportive shoe gear daily. Report any pedal injuries to medical professional. Call office if there are any questions/concerns. -Patient/POA to call should there be question/concern in the interim.   Return in about 3 months (around 05/12/2024).  Delon LITTIE Merlin, DPM      [place] LOCATION: [address], [place] [address]                   Office [phone]   [facility] LOCATION: [address] [place], [place] [address] Office [phone]

## 2024-03-05 ENCOUNTER — Encounter: Payer: Self-pay | Admitting: Family Medicine

## 2024-03-05 DIAGNOSIS — R809 Proteinuria, unspecified: Secondary | ICD-10-CM

## 2024-03-06 MED ORDER — RELION GLUCOSE TEST STRIPS VI STRP: ORAL_STRIP | 12 refills | Status: AC

## 2024-03-08 DIAGNOSIS — E119 Type 2 diabetes mellitus without complications: Secondary | ICD-10-CM | POA: Diagnosis not present

## 2024-03-08 LAB — OPHTHALMOLOGY REPORT-SCANNED

## 2024-03-20 ENCOUNTER — Encounter: Payer: Self-pay | Admitting: Family Medicine

## 2024-03-20 ENCOUNTER — Ambulatory Visit: Admitting: Family Medicine

## 2024-03-20 VITALS — BP 128/80 | HR 94 | Resp 16 | Ht 74.49 in | Wt 343.5 lb

## 2024-03-20 DIAGNOSIS — I700 Atherosclerosis of aorta: Secondary | ICD-10-CM

## 2024-03-20 DIAGNOSIS — E2740 Unspecified adrenocortical insufficiency: Secondary | ICD-10-CM

## 2024-03-20 DIAGNOSIS — R000 Tachycardia, unspecified: Secondary | ICD-10-CM

## 2024-03-20 DIAGNOSIS — R972 Elevated prostate specific antigen [PSA]: Secondary | ICD-10-CM

## 2024-03-20 DIAGNOSIS — E1129 Type 2 diabetes mellitus with other diabetic kidney complication: Secondary | ICD-10-CM

## 2024-03-20 DIAGNOSIS — G4733 Obstructive sleep apnea (adult) (pediatric): Secondary | ICD-10-CM

## 2024-03-20 DIAGNOSIS — E119 Type 2 diabetes mellitus without complications: Secondary | ICD-10-CM

## 2024-03-20 DIAGNOSIS — E1169 Type 2 diabetes mellitus with other specified complication: Secondary | ICD-10-CM

## 2024-03-20 DIAGNOSIS — F418 Other specified anxiety disorders: Secondary | ICD-10-CM

## 2024-03-20 LAB — POCT GLYCOSYLATED HEMOGLOBIN (HGB A1C): Hemoglobin A1C: 6.5 % — AB (ref 4.0–5.6)

## 2024-03-20 MED ORDER — CITALOPRAM HYDROBROMIDE 20 MG PO TABS: 20.0000 mg | ORAL_TABLET | Freq: Every day | ORAL | 1 refills | Status: AC

## 2024-03-20 MED ORDER — LISINOPRIL 5 MG PO TABS: 5.0000 mg | ORAL_TABLET | Freq: Every day | ORAL | 1 refills | Status: AC

## 2024-03-20 MED ORDER — DAPAGLIFLOZIN PRO-METFORMIN ER 5-1000 MG PO TB24: 2.0000 | ORAL_TABLET | Freq: Every day | ORAL | 1 refills | Status: AC

## 2024-03-20 MED ORDER — RYBELSUS 7 MG PO TABS: 1.0000 | ORAL_TABLET | Freq: Every day | ORAL | 1 refills | Status: AC

## 2024-03-20 MED ORDER — ATENOLOL 25 MG PO TABS: 25.0000 mg | ORAL_TABLET | Freq: Every day | ORAL | 1 refills | Status: AC

## 2024-03-20 MED ORDER — ROSUVASTATIN CALCIUM 20 MG PO TABS: 20.0000 mg | ORAL_TABLET | Freq: Every day | ORAL | 0 refills | Status: AC

## 2024-03-20 MED ORDER — PIOGLITAZONE HCL 15 MG PO TABS: 15.0000 mg | ORAL_TABLET | Freq: Every day | ORAL | 1 refills | Status: AC

## 2024-03-20 NOTE — Progress Notes (Signed)
 Name: Isaac Cross   MRN: [id number]    DOB: Feb 15, 1961   Date:03/20/2024       Progress Note  Subjective  Chief Complaint  Chief Complaint  Patient presents with   Medical Management of Chronic Issues   Discussed the use of AI scribe software for clinical note transcription with the patient, who gave verbal consent to proceed.  History of Present Illness Isaac Cross is a 63 year old male who presents for a routine follow-up visit. He is accompanied by his husband.  He has type 2 diabetes, well-controlled with Xigduo  08/998 mg twice daily, Rybelsus  7 mg daily, and pioglitazone  15 mg His recent A1c improved from 6.8% to 6.5%. He checks his blood sugar at home but ran out of strips a week ago. No symptoms of hypoglycemia, such as shakiness, excessive hunger, or thirst.  He has hypertension, obesity, and diabetes. He takes lisinopril  5 mg daily and atenolol  25 mg daily. His blood pressure was recently recorded at 128/80 mmHg. He also takes hydrocortisone  20 mg daily and Florinef  0.1 mg daily for adrenal insufficiency.  He has a history of microalbuminuria. He has a history of iron deficiency anemia, but recent iron levels were normal. He experienced dizziness in the past, which improved with iron supplementation.  He has a history of gynecomastia, for which he underwent a mammogram and surgical consultation, both of which were clear. The gynecomastia fluctuates and is currently not present. No nipple discharge or tenderness.  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 He uses Breo and CPAP. No current symptoms of cough, wheezing, or shortness of breath, and he feels rested upon waking.  He takes famotidine  twice daily for reflux and reports no current issues. He also uses albuterol  as needed, but has not required it recently.  He is emotionally stable, taking citalopram , and reports doing well since returning to work after retirement. He [place] to [place] in  [place].    Patient Active Problem List   Diagnosis Date Noted   Encounter for screening colonoscopy 11/29/2023   Tachycardia 11/17/2023   Bilateral sensorineural hearing loss 09/16/2023   Perennial allergic rhinitis with seasonal variation 06/22/2022   Adrenal insufficiency 03/23/2022   Microalbuminuria 03/23/2022   History of total knee arthroplasty 09/04/2018   Coronary atherosclerosis due to calcified coronary lesion 12/02/2017   Atherosclerosis of aorta 06/17/2015   Fatty liver disease, nonalcoholic 06/17/2015   Renal cyst, right 06/17/2015   BPH (benign prostatic hyperplasia) 05/25/2015   Microscopic hematuria 05/25/2015   Second degree hemorrhoids    Diverticulosis of large intestine without diverticulitis    Allergic rhinitis 10/13/2014   ED (erectile dysfunction) of organic origin 07/22/2014   Gastro-esophageal reflux disease without esophagitis 07/22/2014   Benign hypertension 07/22/2014   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HCC) 07/22/2014   Chronic obstructive pulmonary disease (HCC) 07/22/2014   Depression with anxiety 07/22/2014   Dyslipidemia 07/22/2014   Genital herpes 07/22/2014   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   Osteoarthritis of both knees 07/22/2014   OSA and COPD overlap syndrome (HCC) 05/14/2013   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    Past Surgical History:  Procedure Laterality Date   BLADDER REPAIR     COLONOSCOPY N/A 11/29/2023   Procedure: COLONOSCOPY;  Surgeon: Jinny Carmine, MD;  Location: [facility] ENDOSCOPY;  Service: Endoscopy;  Laterality: N/A;   COLONOSCOPY WITH PROPOFOL  N/A 04/07/2015   Procedure: COLONOSCOPY WITH PROPOFOL ;  Surgeon: Carmine Jinny, MD;  Location:  [facility];  Service: Endoscopy;  Laterality: N/A;  Diabetic - oral meds CPAP   HAND SURGERY Left    4TH AND 5TH FINGER   I & D KNEE WITH POLY EXCHANGE Right 09/18/2018   Procedure: IRRIGATION AND DEBRIDEMENT KNEE WITH POLY EXCHANGE;   Surgeon: Leora Lynwood SAUNDERS, MD;  Location: [facility] ORS;  Service: Orthopedics;  Laterality: Right;   KNEE ARTHROSCOPY Right    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   TOTAL KNEE ARTHROPLASTY Left 09/17/2018   Procedure: I &D left knee and polyethylene exchange;  Surgeon: Leora Lynwood SAUNDERS, MD;  Location: [facility] ORS;  Service: Orthopedics;  Laterality: Left;   TRANSURETHRAL RESECTION OF PROSTATE N/A 09/28/2021   Procedure: TRANSURETHRAL RESECTION OF THE PROSTATE (TURP);  Surgeon: Penne Knee, MD;  Location: [facility] ORS;  Service: Urology;  Laterality: N/A;   TRANSURETHRAL RESECTION OF PROSTATE  09/28/2021   VEIN SURGERY Right     Family History  Problem Relation Age of Onset   Breast cancer Mother 48   Cancer Mother 18       breast   Hypertension Mother    Diabetes Mother    Cancer Father        skin cancer   Diabetes Father    Hypertension Father    Seizures Daughter    Lupus Daughter    Breast cancer Maternal Uncle    Cancer Maternal Uncle        breast cancer   Cancer Maternal Uncle 67       prostate   Cancer Maternal Uncle 76       lung   Cancer Maternal Grandfather        Prostate   Diabetes Paternal Grandfather    Bladder Cancer Neg Hx    Kidney cancer Neg Hx     Social History   Tobacco Use   Smoking status: Former    Current packs/day: 0.00    Average packs/day: 1 pack/day for 10.0 years (10.0 ttl pk-yrs)    Types: Cigarettes    Start date: 04/20/1995    Quit date: 04/19/2005    Years since quitting: 18.9   Smokeless tobacco: Never  Substance Use Topics   Alcohol use: Yes    Alcohol/week: 10.0 standard drinks of alcohol    Types: 10 Cans of beer per week    Comment: occasional     Current Outpatient Medications:    acetic acid -hydrocortisone  (VOSOL -HC) OTIC solution, Place 3 drops into both ears 2 (two) times daily., Disp: 10 mL, Rfl: 0   albuterol  (VENTOLIN  HFA) 108 (90 Base) MCG/ACT inhaler, INHALE 2 PUFFS BY MOUTH EVERY 6 HOURS AS NEEDED, Disp:  8.5 each, Rfl: 0   Azelastine -Fluticasone  137-50 MCG/ACT SUSP, Place 1 spray into the nose every 12 (twelve) hours., Disp: 23 g, Rfl: 2   diclofenac  Sodium (VOLTAREN ) 1 % GEL, Apply 4 g topically 4 (four) times daily. (Patient taking differently: Apply 4 g topically as needed.), Disp: 300 g, Rfl: 1   famotidine  (PEPCID ) 20 MG tablet, Take 20 mg by mouth 2 (two) times daily., Disp: , Rfl:    fludrocortisone  (FLORINEF ) 0.1 MG tablet, Take 0.1 mg by mouth daily. Take two daily if sick, Disp: , Rfl:    fluticasone  furoate-vilanterol (BREO ELLIPTA ) 100-25 MCG/ACT AEPB, INHALE 1 PUFF INTO THE LUNGS DAILY., Disp: 180 each, Rfl: 0   glucose blood (RELION GLUCOSE TEST STRIPS) test strip, Reli On premier BLU Check sugars every day fasting Dx: E11.65, Disp: 100 each, Rfl:  12   hydrocortisone  (CORTEF ) 20 MG tablet, Take 20 mg by mouth daily. Take two daily if sick, Disp: , Rfl:    Multiple Vitamins-Minerals (MENS MULTIVITAMIN PLUS) TABS, Take by mouth., Disp: , Rfl:    atenolol  (TENORMIN ) 25 MG tablet, Take 1 tablet (25 mg total) by mouth daily., Disp: 90 tablet, Rfl: 1   citalopram  (CELEXA ) 20 MG tablet, Take 1 tablet (20 mg total) by mouth daily., Disp: 90 tablet, Rfl: 1   Dapagliflozin  Pro-metFORMIN  ER (XIGDUO  XR) 08-998 MG TB24, Take 2 tablets by mouth daily., Disp: 180 tablet, Rfl: 1   lisinopril  (ZESTRIL ) 5 MG tablet, Take 1 tablet (5 mg total) by mouth daily., Disp: 90 tablet, Rfl: 1   pioglitazone  (ACTOS ) 15 MG tablet, Take 1 tablet (15 mg total) by mouth daily., Disp: 90 tablet, Rfl: 1   rosuvastatin  (CRESTOR ) 20 MG tablet, Take 1 tablet (20 mg total) by mouth daily., Disp: 90 tablet, Rfl: 0   Semaglutide  (RYBELSUS ) 7 MG TABS, Take 1 tablet (7 mg total) by mouth daily., Disp: 90 tablet, Rfl: 1  Allergies  Allergen Reactions   Heparin      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   ROS  Ten systems reviewed and is negative except as mentioned in HPI    Objective Physical Exam  CONSTITUTIONAL: Patient appears well-developed and well-nourished. No distress. HEENT: Head atraumatic, normocephalic, neck supple. CARDIOVASCULAR: Normal rate, regular rhythm and normal heart sounds. No murmur heard. 1+ pitting edema in extremities. PULMONARY: Effort normal and breath sounds normal. Lungs clear to auscultation. No respiratory distress. ABDOMINAL: There is no tenderness or distention. MUSCULOSKELETAL: Normal gait. Without gross motor or sensory deficit. PSYCHIATRIC: Patient has a normal mood and affect. Behavior is normal. Judgment and thought content normal.  Vitals:   03/20/24 0828  BP: 128/80  Pulse: 94  Resp: 16  SpO2: 95%  Weight: (!) 343 lb 8 oz (155.8 kg)  Height: 6' 2.49 (1.892 m)    Body mass index is 43.52 kg/m.  Recent Results (from the past 2160 hours)  OPHTHALMOLOGY REPORT-SCANNED     Status: None   Collection Time: 03/08/24 12:02 PM  Result Value Ref Range   HM Diabetic Eye Exam No Retinopathy No Retinopathy    Comment: Abstracted by HIM   A Comment    POCT glycosylated hemoglobin (Hb A1C)     Status: Abnormal   Collection Time: 03/20/24  8:42 AM  Result Value Ref Range   Hemoglobin A1C 6.5 (A) 4.0 - 5.6 %   HbA1c POC (<> result, manual entry)     HbA1c, POC (prediabetic range)     HbA1c, POC (controlled diabetic range)      Diabetic Foot Exam:     PHQ2/9:    03/20/2024    8:23 AM 11/17/2023    7:51 AM 09/16/2023    9:17 AM 07/18/2023    8:10 AM 04/19/2023    7:37 AM  Depression screen PHQ 2/9  Decreased Interest 0 0 0 0 0  Down, Depressed, Hopeless 0 0 0 0 0  PHQ - 2 Score 0 0 0 0 0  Altered sleeping 0  0 0   Tired, decreased energy 0  0 0   Change in appetite 0  0 0   Feeling bad or failure about yourself  0   0 0   Trouble concentrating 0  0 0   Moving slowly or fidgety/restless 0      Suicidal thoughts 0  0 0   PHQ-9 Score 0  0  0    Difficult doing work/chores Not difficult at all  Not difficult at all Not difficult at all      Data saved with a previous flowsheet row definition    phq 9 is negative  Fall Risk:    03/20/2024    8:23 AM 11/17/2023    7:51 AM 09/16/2023    9:17 AM 07/18/2023    8:10 AM 04/19/2023    7:37 AM  Fall Risk   Falls in the past year? 0 0 0 0 0  Number falls in past yr: 0 0 0 0 0  Injury with Fall? 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 Microalbuminuria monitored. Emphasized maintaining A1c below 7 to prevent complications. - Continue Xigduo  08/998 mg, two tablets daily. - Continue Rybelsus  7 mg daily. - Continue pioglitazone  (Actos ). - Ordered comprehensive metabolic panel including microalbuminuria. - Ordered lipid panel. - Ordered A1c test. - Sent refill for Xigduo .  Obstructive sleep apnea with COPD overlap syndrome Managed with CPAP therapy. No symptoms reported. CPAP use consistent. - Continue CPAP therapy.  Morbid obesity Weight stable at 343.5 lbs. - Continue current management and monitoring.  Hypertension associated with obesity and diabetes Hypertension well-controlled at 128/80 mmHg. Lisinopril  used for renal protection. - Continue lisinopril  5 mg daily. - Continue atenolol  25 mg daily. - Ordered comprehensive metabolic panel.  Atherosclerosis of aorta No changes in management during this  visit.  Adrenocortical insufficiency Managed with hydrocortisone  and Florinef . No recent exacerbations. - Continue hydrocortisone  20 mg daily. - Continue Florinef  0.1 mg daily, increase to twice daily if sick.  Dyslipidemia associated with type 2 DM Managed with rosuvastatin . Ongoing management. - Continue rosuvastatin . - Ordered lipid panel.  Dysthymia  Managed with citalopram . Reports doing well. - Continue citalopram . - Sent refill for citalopram .  Gastroesophageal reflux disease Managed with famotidine . No symptoms reported. - Continue famotidine  twice daily.  Lower extremity edema Mild 1+ pitting edema. Advised on management. - Advised leg exercises and use of compression stockings.  General Health Maintenance Routine health maintenance discussed. - Continue routine health maintenance.

## 2024-03-21 LAB — COMPREHENSIVE METABOLIC PANEL WITH GFR
AG Ratio: 2 (calc) (ref 1.0–2.5)
ALT: 38 U/L (ref 9–46)
AST: 41 U/L — ABNORMAL HIGH (ref 10–35)
Albumin: 4.5 g/dL (ref 3.6–5.1)
Alkaline phosphatase (APISO): 40 U/L (ref 35–144)
BUN: 17 mg/dL (ref 7–25)
CO2: 26 mmol/L (ref 20–32)
Calcium: 10 mg/dL (ref 8.6–10.3)
Chloride: 99 mmol/L (ref 98–110)
Creat: 1.23 mg/dL (ref 0.70–1.35)
Globulin: 2.3 g/dL (ref 1.9–3.7)
Glucose, Bld: 113 mg/dL — ABNORMAL HIGH (ref 65–99)
Potassium: 4.5 mmol/L (ref 3.5–5.3)
Sodium: 138 mmol/L (ref 135–146)
Total Bilirubin: 0.5 mg/dL (ref 0.2–1.2)
Total Protein: 6.8 g/dL (ref 6.1–8.1)
eGFR: 65 mL/min/1.73m2 (ref >=60)

## 2024-03-21 LAB — MICROALBUMIN / CREATININE URINE RATIO
Creatinine, Urine: 176 mg/dL (ref 20–320)
Microalb Creat Ratio: 34 mg/g{creat} — ABNORMAL HIGH (ref <30)
Microalb, Ur: 5.9 mg/dL

## 2024-03-21 LAB — CBC WITH DIFFERENTIAL/PLATELET
Absolute Lymphocytes: 1478 {cells}/uL (ref 850–3900)
Absolute Monocytes: 629 {cells}/uL (ref 200–950)
Basophils Absolute: 22 {cells}/uL (ref 0–200)
Basophils Relative: 0.5 %
Eosinophils Absolute: 22 {cells}/uL (ref 15–500)
Eosinophils Relative: 0.5 %
HCT: 39.1 % — ABNORMAL LOW (ref 39.4–51.1)
Hemoglobin: 12.7 g/dL — ABNORMAL LOW (ref 13.2–17.1)
MCH: 27.5 pg (ref 27.0–33.0)
MCHC: 32.5 g/dL (ref 31.6–35.4)
MCV: 84.8 fL (ref 81.4–101.7)
MPV: 11.1 fL (ref 7.5–12.5)
Monocytes Relative: 14.3 %
Neutro Abs: 2248 {cells}/uL (ref 1500–7800)
Neutrophils Relative %: 51.1 %
Platelets: 207 Thousand/uL (ref 140–400)
RBC: 4.61 Million/uL (ref 4.20–5.80)
RDW: 14.2 % (ref 11.0–15.0)
Total Lymphocyte: 33.6 %
WBC: 4.4 Thousand/uL (ref 3.8–10.8)

## 2024-03-21 LAB — LIPID PANEL
Cholesterol: 136 mg/dL (ref <200)
HDL: 69 mg/dL (ref >=40)
LDL Cholesterol (Calc): 49 mg/dL
Non-HDL Cholesterol (Calc): 67 mg/dL (ref <130)
Total CHOL/HDL Ratio: 2 (calc) (ref <5.0)
Triglycerides: 98 mg/dL (ref <150)

## 2024-03-21 LAB — VITAMIN D 25 HYDROXY (VIT D DEFICIENCY, FRACTURES): Vit D, 25-Hydroxy: 37 ng/mL (ref 30–100)

## 2024-03-23 ENCOUNTER — Ambulatory Visit: Payer: Self-pay | Admitting: Family Medicine

## 2024-03-24 ENCOUNTER — Encounter: Payer: Self-pay | Admitting: Family Medicine

## 2024-03-26 MED ORDER — BLOOD GLUCOSE MONITOR SOFTWARE DEVI: 0 refills | Status: AC

## 2024-03-30 ENCOUNTER — Other Ambulatory Visit: Payer: Self-pay | Admitting: Family Medicine

## 2024-03-30 DIAGNOSIS — J449 Chronic obstructive pulmonary disease, unspecified: Secondary | ICD-10-CM

## 2024-04-02 NOTE — Telephone Encounter (Signed)
 Requested Prescriptions  Pending Prescriptions Disp Refills   BREO ELLIPTA  100-25 MCG/ACT AEPB [Pharmacy Med Name: BREO ELLIPTA  100-25 MCG/ACT Inhalation Aerosol Powder Breath Activated] 180 each 0    Sig: INHALE 1 PUFF INTO THE LUNGS DAILY.     Pulmonology:  Combination Products Passed - 04/02/2024  2:12 PM      Passed - Valid encounter within last 12 months    Recent Outpatient Visits           1 week ago Type 2 diabetes mellitus with microalbuminuria [facility])   [place] [facility] Glenard Mire, MD   4 months ago Type 2 diabetes mellitus with microalbuminuria [facility])   [place] [facility] Glenard Mire, MD   6 months ago [facility] [employer] preventive visit   [facility] Glenard Mire, MD   8 months ago Atherosclerosis of aorta   [facility] Sowles, Krichna, MD

## 2024-04-26 ENCOUNTER — Ambulatory Visit (INDEPENDENT_AMBULATORY_CARE_PROVIDER_SITE_OTHER): Admitting: Family Medicine

## 2024-04-26 ENCOUNTER — Encounter: Payer: Self-pay | Admitting: Family Medicine

## 2024-04-26 VITALS — BP 126/76 | HR 91 | Resp 16 | Ht 74.49 in | Wt 342.4 lb

## 2024-04-26 DIAGNOSIS — K429 Umbilical hernia without obstruction or gangrene: Secondary | ICD-10-CM

## 2024-04-26 NOTE — Progress Notes (Signed)
 Name: Isaac Cross   MRN: [id number]    DOB: 11/23/1960   Date:04/26/2024       Progress Note  Subjective  Chief Complaint  Chief Complaint  Patient presents with   Hernia    Near navel sticking out    Discussed the use of AI scribe software for clinical note transcription with the patient, who gave verbal consent to proceed.  History of Present Illness Isaac Cross is a 64 year old male who presents with an enlarging umbilical hernia.  He has noticed an increase in the size of his umbilical hernia, which he first became aware of during a previous visit. The hernia has become much larger recently, causing discomfort when straining during bowel movements, as it protrudes further during these times. He can push the hernia back in, although this action causes pain.  He has a history of leg swelling, attributed to previous vein surgery. His legs swell, particularly after prolonged standing, and he manages this by elevating his legs. He does not currently use compression stockings.    Patient Active Problem List   Diagnosis Date Noted   Encounter for screening colonoscopy 11/29/2023   Tachycardia 11/17/2023   Bilateral sensorineural hearing loss 09/16/2023   Perennial allergic rhinitis with seasonal variation 06/22/2022   Adrenal insufficiency 03/23/2022   Microalbuminuria 03/23/2022   History of total knee arthroplasty 09/04/2018   Coronary atherosclerosis due to calcified coronary lesion 12/02/2017   Atherosclerosis of aorta 06/17/2015   Fatty liver disease, nonalcoholic 06/17/2015   Renal cyst, right 06/17/2015   BPH (benign prostatic hyperplasia) 05/25/2015   Microscopic hematuria 05/25/2015   Second degree hemorrhoids    Diverticulosis of large intestine without diverticulitis    Allergic rhinitis 10/13/2014   ED (erectile dysfunction) of organic origin 07/22/2014   Gastro-esophageal reflux disease without esophagitis 07/22/2014   Benign hypertension 07/22/2014   Type 2  diabetes mellitus with microalbuminuria (HCC) 07/22/2014   Chronic obstructive pulmonary disease (HCC) 07/22/2014   Depression with anxiety 07/22/2014   Dyslipidemia 07/22/2014   Genital herpes 07/22/2014   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   Osteoarthritis of both knees 07/22/2014   OSA and COPD overlap syndrome (HCC) 05/14/2013   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    Social History   Tobacco Use   Smoking status: Former    Current packs/day: 0.00    Average packs/day: 1 pack/day for 10.0 years (10.0 ttl pk-yrs)    Types: Cigarettes    Start date: 04/20/1995    Quit date: 04/19/2005    Years since quitting: 19.0   Smokeless tobacco: Never  Substance Use Topics   Alcohol use: Yes    Alcohol/week: 10.0 standard drinks of alcohol    Types: 10 Cans of beer per week    Comment: occasional    Current Medications[1]  Allergies[2]  ROS  Ten systems reviewed and is negative except as mentioned in HPI    Objective  Vitals:   04/26/24 1056  BP: 126/76  Pulse: 91  Resp: 16  SpO2: 99%  Weight: (!) 342 lb 6.4 oz (155.3 kg)  Height: 6' 2.49 (1.892 m)    Body mass index is 43.39 kg/m.   Physical Exam CONSTITUTIONAL: Patient appears well-developed and well-nourished. No distress. HEENT: Head atraumatic, normocephalic, neck supple. CARDIOVASCULAR: Normal rate, regular rhythm and normal heart sounds. No murmur heard. 1+ edema in legs. PULMONARY: Effort normal and breath sounds normal. No respiratory distress. ABDOMINAL: Umbilical hernia present, reducible . No redness or increase in  warmth  MUSCULOSKELETAL: Normal gait. Without gross motor or sensory deficit. PSYCHIATRIC: Patient has a normal mood and affect. Behavior is normal. Judgment and thought content normal.  Recent Results (from the past 2160 hours)  OPHTHALMOLOGY REPORT-SCANNED     Status: None   Collection Time: 03/08/24 12:02 PM  Result Value Ref Range   HM Diabetic Eye Exam No Retinopathy No  Retinopathy    Comment: Abstracted by HIM   A Comment    POCT glycosylated hemoglobin (Hb A1C)     Status: Abnormal   Collection Time: 03/20/24  8:42 AM  Result Value Ref Range   Hemoglobin A1C 6.5 (A) 4.0 - 5.6 %   HbA1c POC (<> result, manual entry)     HbA1c, POC (prediabetic range)     HbA1c, POC (controlled diabetic range)    Urine Microalbumin w/creat. ratio     Status: Abnormal   Collection Time: 03/20/24  9:10 AM  Result Value Ref Range   Creatinine, Urine 176 20 - 320 mg/dL   Microalb, Ur 5.9 mg/dL    Comment: Reference Range Not established    Microalb Creat Ratio 34 (H) <30 mg/g creat    Comment: . The ADA defines abnormalities in albumin  excretion as follows: SABRA Albuminuria Category        Result (mg/g creatinine) . Normal to Mildly increased   <30 Moderately increased         30-299  Severely increased           > OR = 300 . The ADA recommends that at least two of three specimens collected within a 3-6 month period be abnormal before considering a patient to be within a diagnostic category.   Lipid panel     Status: None   Collection Time: 03/20/24  9:10 AM  Result Value Ref Range   Cholesterol 136 <200 mg/dL   HDL 69 > OR = 40 mg/dL   Triglycerides 98 <849 mg/dL   LDL Cholesterol (Calc) 49 mg/dL (calc)    Comment: Reference range: <100 . Desirable range <100 mg/dL for primary prevention;   <70 mg/dL for patients with CHD or diabetic patients  with > or = 2 CHD risk factors. SABRA LDL-C is now calculated using the Martin-Hopkins  calculation, which is a validated novel method providing  better accuracy than the Friedewald equation in the  estimation of LDL-C.  Gladis APPLETHWAITE et al. SANDREA. 7986;689(80): 2061-2068  (http://education.[facility].com/faq/FAQ164)    Total CHOL/HDL Ratio 2.0 <5.0 (calc)   Non-HDL Cholesterol (Calc) 67 <869 mg/dL (calc)    Comment: For patients with diabetes plus 1 major ASCVD risk  factor, treating to a non-HDL-C goal of <100  mg/dL  (LDL-C of <29 mg/dL) is considered a therapeutic  option.   Comprehensive metabolic panel with GFR     Status: Abnormal   Collection Time: 03/20/24  9:10 AM  Result Value Ref Range   Glucose, Bld 113 (H) 65 - 99 mg/dL    Comment: .            Fasting reference interval . For someone without known diabetes, a glucose value between 100 and 125 mg/dL is consistent with prediabetes and should be confirmed with a follow-up test. .    BUN 17 7 - 25 mg/dL   Creat 8.76 9.29 - 8.64 mg/dL   eGFR 65 > OR = 60 fO/fpw/8.26f7   BUN/Creatinine Ratio SEE NOTE: 6 - 22 (calc)    Comment:    Not Reported: BUN  and Creatinine are within    reference range. .    Sodium 138 135 - 146 mmol/L   Potassium 4.5 3.5 - 5.3 mmol/L   Chloride 99 98 - 110 mmol/L   CO2 26 20 - 32 mmol/L   Calcium  10.0 8.6 - 10.3 mg/dL   Total Protein 6.8 6.1 - 8.1 g/dL   Albumin  4.5 3.6 - 5.1 g/dL   Globulin 2.3 1.9 - 3.7 g/dL (calc)   AG Ratio 2.0 1.0 - 2.5 (calc)   Total Bilirubin 0.5 0.2 - 1.2 mg/dL   Alkaline phosphatase (APISO) 40 35 - 144 U/L   AST 41 (H) 10 - 35 U/L   ALT 38 9 - 46 U/L  CBC with Differential/Platelet     Status: Abnormal   Collection Time: 03/20/24  9:10 AM  Result Value Ref Range   WBC 4.4 3.8 - 10.8 Thousand/uL   RBC 4.61 4.20 - 5.80 Million/uL   Hemoglobin 12.7 (L) 13.2 - 17.1 g/dL   HCT 60.8 (L) 60.5 - 48.8 %   MCV 84.8 81.4 - 101.7 fL   MCH 27.5 27.0 - 33.0 pg   MCHC 32.5 31.6 - 35.4 g/dL    Comment: For adults, a slight decrease in the calculated MCHC value (in the range of 30 to 32 g/dL) is most likely not clinically significant; however, it should be interpreted with caution in correlation with other red cell parameters and the patient's clinical condition.    RDW 14.2 11.0 - 15.0 %   Platelets 207 140 - 400 Thousand/uL   MPV 11.1 7.5 - 12.5 fL   Neutro Abs 2,248 1,500 - 7,800 cells/uL   Absolute Lymphocytes 1,478 850 - 3,900 cells/uL   Absolute Monocytes 629 200 - 950  cells/uL   Eosinophils Absolute 22 15 - 500 cells/uL   Basophils Absolute 22 0 - 200 cells/uL   Neutrophils Relative % 51.1 %   Total Lymphocyte 33.6 %   Monocytes Relative 14.3 %   Eosinophils Relative 0.5 %   Basophils Relative 0.5 %  VITAMIN D  25 Hydroxy (Vit-D Deficiency, Fractures)     Status: None   Collection Time: 03/20/24  9:10 AM  Result Value Ref Range   Vit D, 25-Hydroxy 37 30 - 100 ng/mL    Comment: Vitamin D  Status         25-OH Vitamin D : . Deficiency:                    <20 ng/mL Insufficiency:             20 - 29 ng/mL Optimal:                 > or = 30 ng/mL . For 25-OH Vitamin D  testing on patients on  D2-supplementation and patients for whom quantitation  of D2 and D3 fractions is required, the [facility](TM) 25-OH VIT D, (D2,D3), LC/MS/MS is recommended: order  code 07111 (patients >11yrs). . See Note 1 . Note 1 . For additional information, please refer to  http://education.[facility].com/faq/FAQ199  (This link is being provided for informational/ educational purposes only.)      Assessment & Plan Umbilical hernia Increased size and pain with risk of incarceration or strangulation. Surgical repair needed, but weight may complicate mesh placement. - Referred to Dr. Desiderio  general surgeon, for evaluation and management. - Educated on signs of complications: redness, warmth, irreducibility, and pain. - Advised to seek emergency care if hernia becomes red, warm, or irreducible.  Chronic lower extremity edema Likely related to previous vein clipping. - Advised to elevate legs daily to reduce swelling. - Recommended use of compression stockings to manage edema.            [1]  Current Outpatient Medications:    acetic acid -hydrocortisone  (VOSOL -HC) OTIC solution, Place 3 drops into both ears 2 (two) times daily., Disp: 10 mL, Rfl: 0   albuterol  (VENTOLIN  HFA) 108 (90 Base) MCG/ACT inhaler, INHALE 2 PUFFS BY MOUTH EVERY 6 HOURS AS  NEEDED, Disp: 8.5 each, Rfl: 0   atenolol  (TENORMIN ) 25 MG tablet, Take 1 tablet (25 mg total) by mouth daily., Disp: 90 tablet, Rfl: 1   Azelastine -Fluticasone  137-50 MCG/ACT SUSP, Place 1 spray into the nose every 12 (twelve) hours., Disp: 23 g, Rfl: 2   Blood Glucose Monitor Software DEVI, HUMANA TRUE METRIX Dx:E11.65 Check BG BID, Disp: 1 Box, Rfl: 0   citalopram  (CELEXA ) 20 MG tablet, Take 1 tablet (20 mg total) by mouth daily., Disp: 90 tablet, Rfl: 1   Dapagliflozin  Pro-metFORMIN  ER (XIGDUO  XR) 08-998 MG TB24, Take 2 tablets by mouth daily., Disp: 180 tablet, Rfl: 1   diclofenac  Sodium (VOLTAREN ) 1 % GEL, Apply 4 g topically 4 (four) times daily. (Patient taking differently: Apply 4 g topically as needed.), Disp: 300 g, Rfl: 1   famotidine  (PEPCID ) 20 MG tablet, Take 20 mg by mouth 2 (two) times daily., Disp: , Rfl:    fludrocortisone  (FLORINEF ) 0.1 MG tablet, Take 0.1 mg by mouth daily. Take two daily if sick, Disp: , Rfl:    fluticasone  furoate-vilanterol (BREO ELLIPTA ) 100-25 MCG/ACT AEPB, INHALE 1 PUFF INTO THE LUNGS DAILY., Disp: 180 each, Rfl: 0   glucose blood (RELION GLUCOSE TEST STRIPS) test strip, Reli On premier BLU Check sugars every day fasting Dx: E11.65, Disp: 100 each, Rfl: 12   hydrocortisone  (CORTEF ) 20 MG tablet, Take 20 mg by mouth daily. Take two daily if sick, Disp: , Rfl:    lisinopril  (ZESTRIL ) 5 MG tablet, Take 1 tablet (5 mg total) by mouth daily., Disp: 90 tablet, Rfl: 1   Multiple Vitamins-Minerals (MENS MULTIVITAMIN PLUS) TABS, Take by mouth., Disp: , Rfl:    pioglitazone  (ACTOS ) 15 MG tablet, Take 1 tablet (15 mg total) by mouth daily., Disp: 90 tablet, Rfl: 1   rosuvastatin  (CRESTOR ) 20 MG tablet, Take 1 tablet (20 mg total) by mouth daily., Disp: 90 tablet, Rfl: 0   Semaglutide  (RYBELSUS ) 7 MG TABS, Take 1 tablet (7 mg total) by mouth daily., Disp: 90 tablet, Rfl: 1 [2]  Allergies Allergen Reactions   Heparin      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

## 2024-05-11 ENCOUNTER — Encounter: Payer: Self-pay | Admitting: Podiatry

## 2024-05-11 ENCOUNTER — Ambulatory Visit: Admitting: Podiatry

## 2024-05-11 DIAGNOSIS — E1129 Type 2 diabetes mellitus with other diabetic kidney complication: Secondary | ICD-10-CM | POA: Diagnosis not present

## 2024-05-11 DIAGNOSIS — M79674 Pain in right toe(s): Secondary | ICD-10-CM | POA: Diagnosis not present

## 2024-05-11 DIAGNOSIS — Q828 Other specified congenital malformations of skin: Secondary | ICD-10-CM | POA: Diagnosis not present

## 2024-05-11 DIAGNOSIS — B351 Tinea unguium: Secondary | ICD-10-CM

## 2024-05-11 DIAGNOSIS — M79675 Pain in left toe(s): Secondary | ICD-10-CM | POA: Diagnosis not present

## 2024-05-14 ENCOUNTER — Ambulatory Visit: Admitting: Surgery

## 2024-05-15 NOTE — Progress Notes (Signed)
"  °  Subjective:  Patient ID: Isaac Cross, male    DOB: 1961/04/17,  MRN: [id number]  Isaac Cross presents to clinic today for preventative diabetic foot care and painful porokeratotic lesion(s) left foot and painful mycotic toenails that limit ambulation. Painful toenails interfere with ambulation. Aggravating factors include wearing enclosed shoe gear. Pain is relieved with periodic professional debridement. Painful porokeratotic lesions are aggravated when weightbearing with and without shoegear. Pain is relieved with periodic professional debridement.  Chief Complaint  Patient presents with   Nail Problem    RFC. He saw Dr. Glenard in Dec. A1c 6.5   New problem(s): None.   PCP is Glenard Mire, MD.  Allergies[1]  Review of Systems: Negative except as noted in the HPI.  Objective:  There were no vitals filed for this visit. Isaac Cross is a pleasant 64 y.o. male obese in NAD. AAO x 3.  Vascular Examination: Vascular status intact b/l with palpable pedal pulses. Pedal hair present. CFT immediate b/l. No edema. No pain with calf compression b/l. Skin temperature gradient WNL b/l. No cyanosis or clubbing noted b/l LE.  Neurological Examination: Sensation grossly intact b/l with 10 gram monofilament. Vibratory sensation intact b/l.   Dermatological Examination: Pedal skin dry and flaky b/l. Toenails 1-5 b/l thick, discolored, elongated with subungual debris and pain on dorsal palpation. Porokeratotic lesion(s) sub 5th met base left foot. No erythema, no edema, no drainage, no fluctuance.   Musculoskeletal Examination: Muscle strength 5/5 to b/l LE. No pain, crepitus or joint limitation noted with ROM bilateral LE. No gross bony deformities bilaterally.  Radiographs: None  Assessment/Plan: 1. Pain due to onychomycosis of toenails of both feet   2. Porokeratosis   3. Type 2 diabetes mellitus with other diabetic kidney complication [facility])   Patient was evaluated and treated.  All patient's and/or POA's questions/concerns addressed on today's visit. For xerosis, advised Eucerin Intensive therapy lotion once daily. Mycotic toenails 1-5 b/l debrided in length and girth without incident. Porokeratotic lesion(s) sub 5th met base left foot pared with sharp debridement without incident. Continue daily foot inspections and monitor blood glucose per PCP/Endocrinologist's recommendations. Continue soft, supportive shoe gear daily. Report any pedal injuries to medical professional. Call office if there are any questions/concerns.  Return in about 3 months (around 08/09/2024).  Delon LITTIE Merlin, DPM      [facility] LOCATION: 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

## 2024-05-23 ENCOUNTER — Ambulatory Visit: Admitting: Surgery

## 2024-05-23 ENCOUNTER — Encounter: Payer: Self-pay | Admitting: Surgery

## 2024-05-23 VITALS — BP 114/75 | HR 93 | Ht 76.0 in | Wt 343.0 lb

## 2024-05-23 DIAGNOSIS — K429 Umbilical hernia without obstruction or gangrene: Secondary | ICD-10-CM

## 2024-05-23 NOTE — Progress Notes (Signed)
 " 05/23/2024  Reason for Visit: Umbilical hernia  Requesting Provider:  Dorette Loron, MD  History of Present Illness: Isaac Cross is a 64 y.o. male presenting for evaluation of an umbilical hernia.  The patient reports that he has had an area where the umbilicus bulges for many years he feels that almost his entire life.  However more recently, his wife noted that this area was bulging more and had enlarged.  As such, he wanted to be evaluated.  He denies any pain in the area but has noticed that with more exertion, the area bulges little bit more.  Denies any episodes of severe pain and denies any issues with nausea, vomiting, constipation, or diarrhea.    He has a history of adrenal insufficiency and is on hydrocortisone  and fludrocortisone .  He also has a history of prostate cancer and had a transurethral resection of the prostate.  Past Medical History: Past Medical History:  Diagnosis Date   Allergy    Anemia    Anxiety    Arthritis    knees   Asthma, mild intermittent, well-controlled    Chronic obstructive asthma (HCC)    Chronic obstructive pulmonary disease (HCC) 07/22/2014   COPD (chronic obstructive pulmonary disease) (HCC)    Depression with anxiety    Diabetes (HCC)    Encounter for screening colonoscopy 11/29/2023   GERD (gastroesophageal reflux disease)    HBP (high blood pressure)    High cholesterol    Renal cyst, right    Sleep apnea    CPAP   Swelling      Past Surgical History: Past Surgical History:  Procedure Laterality Date   BLADDER REPAIR     COLONOSCOPY N/A 11/29/2023   Procedure: COLONOSCOPY;  Surgeon: Jinny Carmine, MD;  Location: [facility];  Service: Endoscopy;  Laterality: N/A;   COLONOSCOPY WITH PROPOFOL  N/A 04/07/2015   Procedure: COLONOSCOPY WITH PROPOFOL ;  Surgeon: Carmine Jinny, MD;  Location: [facility];  Service: Endoscopy;  Laterality: N/A;  Diabetic - oral meds CPAP   HAND SURGERY Left    4TH AND 5TH FINGER   I & D KNEE  WITH POLY EXCHANGE Right 09/18/2018   Procedure: IRRIGATION AND DEBRIDEMENT KNEE WITH POLY EXCHANGE;  Surgeon: Leora Lynwood SAUNDERS, MD;  Location: [facility];  Service: Orthopedics;  Laterality: Right;   KNEE ARTHROSCOPY Right    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   TOTAL KNEE ARTHROPLASTY Left 09/17/2018   Procedure: I &D left knee and polyethylene exchange;  Surgeon: Leora Lynwood SAUNDERS, MD;  Location: [facility];  Service: Orthopedics;  Laterality: Left;   TRANSURETHRAL RESECTION OF PROSTATE N/A 09/28/2021   Procedure: TRANSURETHRAL RESECTION OF THE PROSTATE (TURP);  Surgeon: Penne Knee, MD;  Location: [facility];  Service: Urology;  Laterality: N/A;   TRANSURETHRAL RESECTION OF PROSTATE  09/28/2021   VEIN SURGERY Right     Home Medications: Prior to Admission medications  Medication Sig Start Date End Date Taking? Authorizing Provider  acetic acid -hydrocortisone  (VOSOL -HC) OTIC solution Place 3 drops into both ears 2 (two) times daily. 05/05/22   Sowles, Krichna, MD  albuterol  (VENTOLIN  HFA) 108 (90 Base) MCG/ACT inhaler INHALE 2 PUFFS BY MOUTH EVERY 6 HOURS AS NEEDED 03/26/22   Sowles, Krichna, MD  atenolol  (TENORMIN ) 25 MG tablet Take 1 tablet (25 mg total) by mouth daily. 03/20/24   Sowles, Krichna, MD  Azelastine -Fluticasone  137-50 MCG/ACT SUSP Place 1 spray into the nose every 12 (twelve) hours. 04/16/21   Sowles, Krichna, MD  Blood Glucose Monitor Software DEVI HUMANA TRUE METRIX Dx:E11.65 Check BG BID 03/26/24   Sowles, Krichna, MD  citalopram  (CELEXA ) 20 MG tablet Take 1 tablet (20 mg total) by mouth daily. 03/20/24   Sowles, Krichna, MD  Dapagliflozin  Pro-metFORMIN  ER (XIGDUO  XR) 08-998 MG TB24 Take 2 tablets by mouth daily. 03/20/24   Sowles, Krichna, MD  diclofenac  Sodium (VOLTAREN ) 1 % GEL Apply 4 g topically 4 (four) times daily. Patient taking differently: Apply 4 g topically as needed. 07/25/20   Sowles, Krichna, MD  famotidine  (PEPCID ) 20 MG tablet Take 20 mg by mouth 2  (two) times daily.    [provider]  fludrocortisone  (FLORINEF ) 0.1 MG tablet Take 0.1 mg by mouth daily. Take two daily if sick    Cherilyn Debby CROME, MD  fluticasone  furoate-vilanterol (BREO ELLIPTA ) 100-25 MCG/ACT AEPB INHALE 1 PUFF INTO THE LUNGS DAILY. 04/02/24   Sowles, Krichna, MD  glucose blood (RELION GLUCOSE TEST STRIPS) test strip Reli On premier BLU Check sugars every day fasting Dx: E11.65 03/06/24   Sowles, Krichna, MD  hydrocortisone  (CORTEF ) 20 MG tablet Take 20 mg by mouth daily. Take two daily if sick    Cherilyn Debby CROME, MD  lisinopril  (ZESTRIL ) 5 MG tablet Take 1 tablet (5 mg total) by mouth daily. 03/20/24   Sowles, Krichna, MD  Multiple Vitamins-Minerals (MENS MULTIVITAMIN PLUS) TABS Take by mouth.    [provider]  pioglitazone  (ACTOS ) 15 MG tablet Take 1 tablet (15 mg total) by mouth daily. 03/20/24   Sowles, Krichna, MD  rosuvastatin  (CRESTOR ) 20 MG tablet Take 1 tablet (20 mg total) by mouth daily. 03/20/24   Sowles, Krichna, MD  Semaglutide  (RYBELSUS ) 7 MG TABS Take 1 tablet (7 mg total) by mouth daily. 03/20/24   Sowles, Krichna, MD    Allergies: Allergies[1]  Social History:  reports that he quit smoking about 19 years ago. His smoking use included cigarettes. He started smoking about 29 years ago. He has a 10 pack-year smoking history. He has never used smokeless tobacco. He reports current alcohol use of about 10.0 standard drinks of alcohol per week. He reports that he does not use drugs.   Family History: Family History  Problem Relation Age of Onset   Breast cancer Mother 59   Cancer Mother 31       breast   Hypertension Mother    Diabetes Mother    Cancer Father        skin cancer   Diabetes Father    Hypertension Father    Seizures Daughter    Lupus Daughter    Breast cancer Maternal Uncle    Cancer Maternal Uncle        breast cancer   Cancer Maternal Uncle 77       prostate   Cancer Maternal Uncle 48       lung    Cancer Maternal Grandfather        Prostate   Diabetes Paternal Grandfather    Bladder Cancer Neg Hx    Kidney cancer Neg Hx     Review of Systems: Review of Systems  Constitutional:  Negative for chills and fever.  Respiratory:  Negative for shortness of breath.   Cardiovascular:  Negative for chest pain.  Gastrointestinal:  Negative for abdominal pain, nausea and vomiting.    Physical Exam BP 114/75   Pulse 93   Ht 6' 4 (1.93 m)   Wt (!) 343 lb (155.6 kg)   SpO2 99%  BMI 41.75 kg/m  CONSTITUTIONAL: No acute distress HEENT:  Normocephalic, atraumatic, extraocular motion intact. RESPIRATORY:  Lungs are clear, and breath sounds are equal bilaterally. Normal respiratory effort without pathologic use of accessory muscles. CARDIOVASCULAR: Heart is regular without murmurs, gallops, or rubs. GI: The abdomen is soft, obese, nondistended, with minimal discomfort to palpation at the umbilical hernia when trying to reduce it.  The hernia itself is easily reducible and is about 2.5 cm in size containing likely fat.  No evidence of strangulation or any overlying skin changes. MUSCULOSKELETAL:  Normal muscle strength and tone in all four extremities.  No peripheral edema or cyanosis. NEUROLOGIC:  Motor and sensation is grossly normal.  Cranial nerves are grossly intact. PSYCH:  Alert and oriented to person, place and time. Affect is normal.  Laboratory Analysis: Labs from 03/20/2024: Sodium 138, potassium 4.5, chloride 99, CO2 26, BUN 17, creatinine 1.23.  Total bilirubin 0.5, AST 41, ALT 38, alkaline phosphatase 40.  WBC 4.4, hemoglobin 12.7, hematocrit 29.1, platelets 207.  Hemoglobin A1c 6.5.  Imaging: No results found.  Assessment and Plan: This is a 64 y.o. male with a reducible umbilical hernia.  - Discussed with the patient the findings on exam that it represents an umbilical hernia which is reducible in nature.  Discussed with patient the different categories of hernias ranging  from reducible, 2 incarcerated, to strangulated and the potential issues with each 1.  At the time, the patient's hernia is reducible and overall asymptomatic.  Discussed with the patient the potential options for either watchful waiting versus surgical repair.  The patient would like to have this hernia repaired to prevent this from getting worse in the future.  However he would like to wait until summer time for surgery.  I think given that he is overall asymptomatic, this is a very reasonable option. - Briefly discussed with him that when we are planning on surgical repair, we will also need to address his adrenal insufficiency and most likely will require stress dose steroids intraoperatively and potentially increasing the doses of his current regimen in the immediate postoperative period.  We will consult with endocrinology as we approach time of surgery for recommendations on regimen postoperatively. - Return precautions given. - Follow-up in the summertime or sooner if any issues arise.  I spent 30 minutes dedicated to the care of this patient on the date of this encounter to include pre-visit review of records, face-to-face time with the patient discussing diagnosis and management, and any post-visit coordination of care.   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

## 2024-05-23 NOTE — Patient Instructions (Signed)
 We will see you back in July, we placed you in our recall system and will send you out a letter once our schedule comes available   A Bulge in the Belly Caused by a Weak Spot (Umbilical Hernia) in Adults: What to Know  A hernia is a lump of tissue that pushes through an opening in the muscles. An umbilical hernia happens in the belly, near the belly button. The hernia may contain tissues from the small or large intestine. It may also have fatty tissue that covers the intestines. Umbilical hernias in adults may get worse over time. They need to be treated with surgery. There are several types of umbilical hernias. They include: Indirect hernia. This occurs just above or below the belly button. It's the most common type of umbilical hernia in adults. Direct hernia. This type occurs in an opening that's formed by the belly button. Reducible hernia. This hernia comes and goes. You may see it only when you strain, cough, or lift something heavy. This type of hernia can be pushed back into the belly (reduced). Incarcerated hernia. This traps the hernia in the wall of the belly. This type of hernia can't be pushed back into the belly. It can cause a strangulated hernia. Strangulated hernia. This hernia cuts off blood flow to the tissues inside the hernia. The tissues can die if this happens. This type of hernia must be treated right away. What are the causes? An umbilical hernia happens when tissue inside the belly pushes through an opening in the muscles of the belly. What increases the risk? You're more likely to get this hernia if: You strain while lifting or pushing heavy objects. You've had several pregnancies. You have a condition that puts pressure on your belly, and you've had it for a long time. These include: Obesity. A buildup of fluid inside your belly. Vomiting or coughing all the time. Trouble pooping (constipation). You've had surgery that weakened the muscles in the belly. What are  the signs or symptoms? The main symptom of this condition is a bulge at the belly button or near it. The bulge does not cause pain. Other symptoms depend on the type of hernia you have. A reducible hernia may be seen only when you strain, cough, or lift something heavy. Other symptoms may include: Dull pain. A feeling of pressure. An incarcerated hernia may cause very bad pain. Also, you may: Vomit or feel like you may vomit. Not be able to pass gas. A strangulated hernia may cause: Pain that gets worse and worse. Vomiting, or feeling like you may vomit. Pain when you press on the hernia. Change of color on the skin over the hernia. The skin may become red or purple. Trouble pooping. Blood in the poop. How is this diagnosed? This condition may be diagnosed based on: Your symptoms and medical history. A physical exam. You may be asked to cough or strain while standing. These actions will put pressure inside your belly. The pressure can force the hernia through the opening in your muscles. Your health care provider may try to push the hernia back into your belly (reduce). How is this treated? Surgery is the only treatment for an umbilical hernia. Surgery for a strangulated hernia must be done right away. If you have a small hernia that's not incarcerated, you may need to lose weight before the surgery is done. Follow these instructions at home: Managing constipation You may need to take these actions to prevent trouble pooping. This will  help to prevent straining. Drink enough fluid to keep your pee (urine) pale yellow. Take over-the-counter or prescription medicines. Eat foods that are high in fiber, such as beans, whole grains, and fresh fruits and vegetables. Limit foods that are high in fat and sugars, such as fried or sweet foods. General instructions Do not try to push the hernia back in. Lose weight, if told by your provider. Watch your hernia for any changes in color or size.  Tell your provider if any changes occur. You may need to avoid activities that put pressure on your hernia. You may have to avoid lifting. Ask your provider how much you can safely lift. Take over-the-counter and prescription medicines only as told by your provider. Contact a health care provider if: Your hernia gets larger or feels hard. Your hernia becomes painful. You get a fever or chills. Get help right away if: You get very bad pain near the area of the hernia, and the pain comes on suddenly. You have pain and you vomit or feel like you may vomit. The skin over your hernia changes color. These symptoms may be an emergency. Get help right away. Call 911. Do not wait to see if the symptoms go away. Do not drive yourself to the hospital. This information is not intended to replace advice given to you by your health care provider. Make sure you discuss any questions you have with your health care provider. Document Revised: 02/16/2024 Document Reviewed: 07/27/2022 [employer] Patient Education  2025 [employer].

## 2024-07-27 ENCOUNTER — Ambulatory Visit: Admitting: Family Medicine

## 2024-08-09 ENCOUNTER — Ambulatory Visit: Admitting: Podiatry
